# Patient Record
Sex: Female | Born: 1940 | Race: Black or African American | Hispanic: No | Marital: Married | Smoking: Never smoker
Health system: Southern US, Community
[De-identification: ages and names within clinical notes are randomized; demographics above are authoritative.]

## PROBLEM LIST (undated history)

## (undated) ENCOUNTER — Inpatient Hospital Stay: Admission: EM | Payer: Self-pay | Source: Home / Self Care

## (undated) DIAGNOSIS — R413 Other amnesia: Principal | ICD-10-CM

## (undated) DIAGNOSIS — G4730 Sleep apnea, unspecified: Secondary | ICD-10-CM

## (undated) DIAGNOSIS — Z950 Presence of cardiac pacemaker: Secondary | ICD-10-CM

## (undated) DIAGNOSIS — G971 Other reaction to spinal and lumbar puncture: Secondary | ICD-10-CM

## (undated) DIAGNOSIS — I639 Cerebral infarction, unspecified: Secondary | ICD-10-CM

## (undated) DIAGNOSIS — Z8489 Family history of other specified conditions: Secondary | ICD-10-CM

## (undated) DIAGNOSIS — F419 Anxiety disorder, unspecified: Secondary | ICD-10-CM

## (undated) DIAGNOSIS — R001 Bradycardia, unspecified: Secondary | ICD-10-CM

## (undated) DIAGNOSIS — E785 Hyperlipidemia, unspecified: Secondary | ICD-10-CM

## (undated) DIAGNOSIS — J189 Pneumonia, unspecified organism: Secondary | ICD-10-CM

## (undated) DIAGNOSIS — I10 Essential (primary) hypertension: Secondary | ICD-10-CM

## (undated) DIAGNOSIS — E039 Hypothyroidism, unspecified: Secondary | ICD-10-CM

## (undated) DIAGNOSIS — Z794 Long term (current) use of insulin: Secondary | ICD-10-CM

## (undated) DIAGNOSIS — G459 Transient cerebral ischemic attack, unspecified: Secondary | ICD-10-CM

## (undated) DIAGNOSIS — H409 Unspecified glaucoma: Secondary | ICD-10-CM

## (undated) DIAGNOSIS — D649 Anemia, unspecified: Secondary | ICD-10-CM

## (undated) DIAGNOSIS — Z9981 Dependence on supplemental oxygen: Secondary | ICD-10-CM

## (undated) DIAGNOSIS — M1990 Unspecified osteoarthritis, unspecified site: Secondary | ICD-10-CM

## (undated) DIAGNOSIS — M810 Age-related osteoporosis without current pathological fracture: Secondary | ICD-10-CM

## (undated) DIAGNOSIS — J45909 Unspecified asthma, uncomplicated: Secondary | ICD-10-CM

## (undated) DIAGNOSIS — H269 Unspecified cataract: Secondary | ICD-10-CM

## (undated) DIAGNOSIS — N200 Calculus of kidney: Secondary | ICD-10-CM

## (undated) DIAGNOSIS — F329 Major depressive disorder, single episode, unspecified: Secondary | ICD-10-CM

## (undated) DIAGNOSIS — I2510 Atherosclerotic heart disease of native coronary artery without angina pectoris: Secondary | ICD-10-CM

## (undated) DIAGNOSIS — E11319 Type 2 diabetes mellitus with unspecified diabetic retinopathy without macular edema: Secondary | ICD-10-CM

## (undated) DIAGNOSIS — M4800 Spinal stenosis, site unspecified: Secondary | ICD-10-CM

## (undated) DIAGNOSIS — E119 Type 2 diabetes mellitus without complications: Secondary | ICD-10-CM

## (undated) DIAGNOSIS — I82409 Acute embolism and thrombosis of unspecified deep veins of unspecified lower extremity: Secondary | ICD-10-CM

## (undated) DIAGNOSIS — G2581 Restless legs syndrome: Secondary | ICD-10-CM

## (undated) DIAGNOSIS — I2699 Other pulmonary embolism without acute cor pulmonale: Secondary | ICD-10-CM

## (undated) DIAGNOSIS — N189 Chronic kidney disease, unspecified: Secondary | ICD-10-CM

## (undated) DIAGNOSIS — I519 Heart disease, unspecified: Secondary | ICD-10-CM

## (undated) DIAGNOSIS — I152 Hypertension secondary to endocrine disorders: Secondary | ICD-10-CM

## (undated) DIAGNOSIS — G4734 Idiopathic sleep related nonobstructive alveolar hypoventilation: Secondary | ICD-10-CM

## (undated) DIAGNOSIS — F32A Depression, unspecified: Secondary | ICD-10-CM

## (undated) DIAGNOSIS — Z8719 Personal history of other diseases of the digestive system: Secondary | ICD-10-CM

## (undated) DIAGNOSIS — Q780 Osteogenesis imperfecta: Secondary | ICD-10-CM

## (undated) DIAGNOSIS — H3530 Unspecified macular degeneration: Secondary | ICD-10-CM

## (undated) DIAGNOSIS — T4145XA Adverse effect of unspecified anesthetic, initial encounter: Secondary | ICD-10-CM

## (undated) DIAGNOSIS — E1140 Type 2 diabetes mellitus with diabetic neuropathy, unspecified: Secondary | ICD-10-CM

## (undated) DIAGNOSIS — E1159 Type 2 diabetes mellitus with other circulatory complications: Secondary | ICD-10-CM

## (undated) DIAGNOSIS — H35039 Hypertensive retinopathy, unspecified eye: Secondary | ICD-10-CM

## (undated) DIAGNOSIS — R002 Palpitations: Secondary | ICD-10-CM

## (undated) DIAGNOSIS — T8859XA Other complications of anesthesia, initial encounter: Secondary | ICD-10-CM

## (undated) HISTORY — DX: Unspecified macular degeneration: H35.30

## (undated) HISTORY — PX: APPENDECTOMY: SHX54

## (undated) HISTORY — PX: GLAUCOMA SURGERY: SHX656

## (undated) HISTORY — DX: Transient cerebral ischemic attack, unspecified: G45.9

## (undated) HISTORY — PX: ABDOMINAL HYSTERECTOMY: SHX81

## (undated) HISTORY — PX: BACK SURGERY: SHX140

## (undated) HISTORY — PX: CHOLECYSTECTOMY: SHX55

## (undated) HISTORY — DX: Palpitations: R00.2

## (undated) HISTORY — PX: TOTAL KNEE ARTHROPLASTY: SHX125

## (undated) HISTORY — PX: EYE SURGERY: SHX253

## (undated) HISTORY — DX: Other amnesia: R41.3

## (undated) HISTORY — PX: REVISION TOTAL KNEE ARTHROPLASTY: SUR1280

## (undated) HISTORY — DX: Type 2 diabetes mellitus with diabetic neuropathy, unspecified: E11.40

## (undated) HISTORY — DX: Heart disease, unspecified: I51.9

## (undated) HISTORY — PX: JOINT REPLACEMENT: SHX530

## (undated) HISTORY — PX: TRANSTHORACIC ECHOCARDIOGRAM: SHX275

## (undated) HISTORY — PX: TRIGGER FINGER RELEASE: SHX641

## (undated) HISTORY — DX: Hypertensive retinopathy, unspecified eye: H35.039

## (undated) HISTORY — DX: Atherosclerotic heart disease of native coronary artery without angina pectoris: I25.10

## (undated) HISTORY — PX: KNEE SURGERY: SHX244

## (undated) HISTORY — PX: NM MYOCAR PERF WALL MOTION: HXRAD629

## (undated) HISTORY — PX: IRIDOTOMY / IRIDECTOMY: SHX165

## (undated) HISTORY — PX: CATARACT EXTRACTION: SUR2

## (undated) HISTORY — PX: CATARACT EXTRACTION W/ INTRAOCULAR LENS  IMPLANT, BILATERAL: SHX1307

## (undated) HISTORY — DX: Osteogenesis imperfecta: Q78.0

## (undated) HISTORY — DX: Type 2 diabetes mellitus with unspecified diabetic retinopathy without macular edema: E11.319

## (undated) HISTORY — PX: TONSILLECTOMY: SUR1361

## (undated) HISTORY — PX: PACEMAKER PLACEMENT: SHX43

---

## 1997-12-19 ENCOUNTER — Encounter: Payer: Self-pay | Admitting: Emergency Medicine

## 1997-12-19 ENCOUNTER — Inpatient Hospital Stay (HOSPITAL_COMMUNITY): Admission: EM | Admit: 1997-12-19 | Discharge: 1997-12-21 | Payer: Self-pay | Admitting: Emergency Medicine

## 1997-12-19 ENCOUNTER — Encounter: Payer: Self-pay | Admitting: Cardiovascular Disease

## 1997-12-21 ENCOUNTER — Encounter: Payer: Self-pay | Admitting: Cardiovascular Disease

## 2001-05-25 ENCOUNTER — Emergency Department (HOSPITAL_COMMUNITY): Admission: EM | Admit: 2001-05-25 | Discharge: 2001-05-25 | Payer: Self-pay | Admitting: Emergency Medicine

## 2001-07-17 ENCOUNTER — Emergency Department (HOSPITAL_COMMUNITY): Admission: EM | Admit: 2001-07-17 | Discharge: 2001-07-17 | Payer: Self-pay | Admitting: Emergency Medicine

## 2001-07-17 ENCOUNTER — Encounter: Payer: Self-pay | Admitting: Emergency Medicine

## 2002-06-19 ENCOUNTER — Emergency Department (HOSPITAL_COMMUNITY): Admission: EM | Admit: 2002-06-19 | Discharge: 2002-06-20 | Payer: Self-pay | Admitting: Emergency Medicine

## 2002-06-20 ENCOUNTER — Encounter: Payer: Self-pay | Admitting: Emergency Medicine

## 2002-12-15 ENCOUNTER — Emergency Department (HOSPITAL_COMMUNITY): Admission: EM | Admit: 2002-12-15 | Discharge: 2002-12-15 | Payer: Self-pay | Admitting: Emergency Medicine

## 2002-12-29 ENCOUNTER — Inpatient Hospital Stay (HOSPITAL_COMMUNITY): Admission: EM | Admit: 2002-12-29 | Discharge: 2003-01-01 | Payer: Self-pay | Admitting: Emergency Medicine

## 2003-01-09 ENCOUNTER — Encounter (INDEPENDENT_AMBULATORY_CARE_PROVIDER_SITE_OTHER): Payer: Self-pay

## 2003-01-09 ENCOUNTER — Ambulatory Visit (HOSPITAL_COMMUNITY): Admission: RE | Admit: 2003-01-09 | Discharge: 2003-01-09 | Payer: Self-pay | Admitting: Gastroenterology

## 2003-02-21 ENCOUNTER — Encounter: Admission: RE | Admit: 2003-02-21 | Discharge: 2003-02-21 | Payer: Self-pay | Admitting: Internal Medicine

## 2003-04-11 ENCOUNTER — Emergency Department (HOSPITAL_COMMUNITY): Admission: EM | Admit: 2003-04-11 | Discharge: 2003-04-11 | Payer: Self-pay | Admitting: Emergency Medicine

## 2004-03-31 ENCOUNTER — Encounter (INDEPENDENT_AMBULATORY_CARE_PROVIDER_SITE_OTHER): Payer: Self-pay | Admitting: *Deleted

## 2004-03-31 ENCOUNTER — Encounter: Admission: RE | Admit: 2004-03-31 | Discharge: 2004-03-31 | Payer: Self-pay | Admitting: General Surgery

## 2004-04-27 ENCOUNTER — Encounter: Admission: RE | Admit: 2004-04-27 | Discharge: 2004-04-27 | Payer: Self-pay | Admitting: Internal Medicine

## 2004-04-29 ENCOUNTER — Ambulatory Visit: Payer: Self-pay

## 2004-05-19 ENCOUNTER — Encounter (HOSPITAL_COMMUNITY): Admission: RE | Admit: 2004-05-19 | Discharge: 2004-08-17 | Payer: Self-pay | Admitting: Internal Medicine

## 2004-10-04 ENCOUNTER — Encounter (HOSPITAL_COMMUNITY): Admission: RE | Admit: 2004-10-04 | Discharge: 2005-01-02 | Payer: Self-pay | Admitting: Internal Medicine

## 2004-10-27 ENCOUNTER — Encounter: Admission: RE | Admit: 2004-10-27 | Discharge: 2004-10-27 | Payer: Self-pay | Admitting: Internal Medicine

## 2005-01-05 ENCOUNTER — Encounter: Admission: RE | Admit: 2005-01-05 | Discharge: 2005-01-05 | Payer: Self-pay | Admitting: Orthopedic Surgery

## 2005-02-08 ENCOUNTER — Emergency Department (HOSPITAL_COMMUNITY): Admission: EM | Admit: 2005-02-08 | Discharge: 2005-02-09 | Payer: Self-pay | Admitting: Emergency Medicine

## 2005-05-17 ENCOUNTER — Encounter: Admission: RE | Admit: 2005-05-17 | Discharge: 2005-05-17 | Payer: Self-pay | Admitting: Orthopedic Surgery

## 2005-08-08 ENCOUNTER — Encounter (HOSPITAL_COMMUNITY): Admission: RE | Admit: 2005-08-08 | Discharge: 2005-10-26 | Payer: Self-pay | Admitting: Internal Medicine

## 2005-08-23 ENCOUNTER — Ambulatory Visit (HOSPITAL_COMMUNITY): Admission: RE | Admit: 2005-08-23 | Discharge: 2005-08-23 | Payer: Self-pay | Admitting: Gastroenterology

## 2005-11-01 ENCOUNTER — Encounter (INDEPENDENT_AMBULATORY_CARE_PROVIDER_SITE_OTHER): Payer: Self-pay | Admitting: *Deleted

## 2005-11-01 ENCOUNTER — Ambulatory Visit (HOSPITAL_COMMUNITY): Admission: RE | Admit: 2005-11-01 | Discharge: 2005-11-01 | Payer: Self-pay | Admitting: Gastroenterology

## 2006-10-12 ENCOUNTER — Emergency Department (HOSPITAL_COMMUNITY): Admission: EM | Admit: 2006-10-12 | Discharge: 2006-10-12 | Payer: Self-pay | Admitting: Emergency Medicine

## 2007-03-16 ENCOUNTER — Ambulatory Visit: Payer: Self-pay | Admitting: *Deleted

## 2007-05-29 ENCOUNTER — Ambulatory Visit: Payer: Self-pay | Admitting: *Deleted

## 2007-07-10 ENCOUNTER — Emergency Department (HOSPITAL_COMMUNITY): Admission: EM | Admit: 2007-07-10 | Discharge: 2007-07-10 | Payer: Self-pay | Admitting: Emergency Medicine

## 2007-10-16 ENCOUNTER — Ambulatory Visit (HOSPITAL_COMMUNITY): Admission: RE | Admit: 2007-10-16 | Discharge: 2007-10-16 | Payer: Self-pay | Admitting: Internal Medicine

## 2007-12-21 ENCOUNTER — Ambulatory Visit (HOSPITAL_COMMUNITY): Admission: RE | Admit: 2007-12-21 | Discharge: 2007-12-21 | Payer: Self-pay | Admitting: Internal Medicine

## 2008-04-14 ENCOUNTER — Emergency Department (HOSPITAL_COMMUNITY): Admission: EM | Admit: 2008-04-14 | Discharge: 2008-04-14 | Payer: Self-pay | Admitting: Emergency Medicine

## 2008-05-20 DIAGNOSIS — E785 Hyperlipidemia, unspecified: Secondary | ICD-10-CM | POA: Insufficient documentation

## 2008-05-20 DIAGNOSIS — I10 Essential (primary) hypertension: Secondary | ICD-10-CM | POA: Insufficient documentation

## 2008-05-30 ENCOUNTER — Ambulatory Visit: Payer: Self-pay | Admitting: Emergency Medicine

## 2008-05-30 ENCOUNTER — Telehealth (INDEPENDENT_AMBULATORY_CARE_PROVIDER_SITE_OTHER): Payer: Self-pay | Admitting: *Deleted

## 2008-05-30 DIAGNOSIS — R0602 Shortness of breath: Secondary | ICD-10-CM | POA: Insufficient documentation

## 2008-06-05 ENCOUNTER — Ambulatory Visit: Payer: Self-pay | Admitting: Cardiology

## 2008-07-01 ENCOUNTER — Ambulatory Visit (HOSPITAL_BASED_OUTPATIENT_CLINIC_OR_DEPARTMENT_OTHER): Admission: RE | Admit: 2008-07-01 | Discharge: 2008-07-01 | Payer: Self-pay | Admitting: Emergency Medicine

## 2008-07-09 ENCOUNTER — Ambulatory Visit: Payer: Self-pay | Admitting: Emergency Medicine

## 2008-07-09 DIAGNOSIS — G4730 Sleep apnea, unspecified: Secondary | ICD-10-CM | POA: Insufficient documentation

## 2008-07-09 DIAGNOSIS — G4733 Obstructive sleep apnea (adult) (pediatric): Secondary | ICD-10-CM | POA: Insufficient documentation

## 2008-07-14 ENCOUNTER — Ambulatory Visit: Payer: Self-pay | Admitting: Pulmonary Disease

## 2008-10-02 ENCOUNTER — Encounter: Payer: Self-pay | Admitting: Emergency Medicine

## 2008-10-07 ENCOUNTER — Ambulatory Visit: Payer: Self-pay | Admitting: Emergency Medicine

## 2008-11-11 ENCOUNTER — Encounter: Admission: RE | Admit: 2008-11-11 | Discharge: 2008-11-11 | Payer: Self-pay | Admitting: Gastroenterology

## 2008-12-05 ENCOUNTER — Telehealth: Payer: Self-pay | Admitting: Emergency Medicine

## 2008-12-25 ENCOUNTER — Encounter: Payer: Self-pay | Admitting: Emergency Medicine

## 2009-03-26 ENCOUNTER — Encounter: Payer: Self-pay | Admitting: Emergency Medicine

## 2009-04-07 ENCOUNTER — Ambulatory Visit: Payer: Self-pay | Admitting: Emergency Medicine

## 2009-04-07 DIAGNOSIS — J309 Allergic rhinitis, unspecified: Secondary | ICD-10-CM | POA: Insufficient documentation

## 2009-04-07 DIAGNOSIS — J310 Chronic rhinitis: Secondary | ICD-10-CM | POA: Insufficient documentation

## 2009-05-12 ENCOUNTER — Emergency Department (HOSPITAL_COMMUNITY): Admission: EM | Admit: 2009-05-12 | Discharge: 2009-05-13 | Payer: Self-pay | Admitting: Emergency Medicine

## 2009-08-31 ENCOUNTER — Encounter: Admission: RE | Admit: 2009-08-31 | Discharge: 2009-08-31 | Payer: Self-pay | Admitting: Internal Medicine

## 2009-10-02 ENCOUNTER — Ambulatory Visit: Payer: Self-pay | Admitting: Emergency Medicine

## 2009-10-02 DIAGNOSIS — J45909 Unspecified asthma, uncomplicated: Secondary | ICD-10-CM | POA: Insufficient documentation

## 2009-10-02 DIAGNOSIS — Z86718 Personal history of other venous thrombosis and embolism: Secondary | ICD-10-CM | POA: Insufficient documentation

## 2009-11-26 ENCOUNTER — Ambulatory Visit (HOSPITAL_COMMUNITY): Admission: RE | Admit: 2009-11-26 | Discharge: 2009-11-26 | Payer: Self-pay | Admitting: Orthopedic Surgery

## 2010-01-14 ENCOUNTER — Ambulatory Visit (HOSPITAL_COMMUNITY)
Admission: RE | Admit: 2010-01-14 | Discharge: 2010-01-14 | Payer: Self-pay | Source: Home / Self Care | Attending: Gastroenterology | Admitting: Gastroenterology

## 2010-01-21 ENCOUNTER — Ambulatory Visit: Payer: Self-pay | Admitting: Emergency Medicine

## 2010-02-16 ENCOUNTER — Encounter
Admission: RE | Admit: 2010-02-16 | Discharge: 2010-02-16 | Payer: Self-pay | Source: Home / Self Care | Attending: Diagnostic Neuroimaging | Admitting: Diagnostic Neuroimaging

## 2010-02-28 ENCOUNTER — Encounter: Payer: Self-pay | Admitting: Gastroenterology

## 2010-03-09 NOTE — Assessment & Plan Note (Signed)
Summary: dyspnea   Visit Type:  Follow-up Copy to:  Dr. Shon Baton Primary Provider/Referring Provider:  Dr. Shon Baton  CC:  rov SOB worse with exertion.. sleep studies results should be back for consultation.  History of Present Illness: 70 yo woman with mild asthma, hx multiple PE's no longer on warfarin due to inability to tolerate. She has noticed progressive DOE beginning about 6 mo ago. Eval has included low prob V/Q scan, full PFT' s that show mixed disease, overnight oximetry was normal, cardiac evaulation (09/12/07) that was reassuring Melvern Banker), no evidence for pulm HTN by TTE. Possible mild interstitial prominence on CXR 04/14/08. 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 Snores, no witnessed apneas, no jerking herself awake, naps some days.   ROV 07/09/08 -- PFT's with mixed disease, CT scan as below (no significant ILD), PSG shows mild OSA based on RDI (AHI was only 3.3/hour).  Returns today for f/u. Has used SABA sparingly, doesn't seem to help her. Still feels SOB with exertion. She is using O2 as needed - feels that this helps even though she has not has exertional desats in the past.   Allergies (verified): 1)  ! Prevacid 2)  ! Iodine 3)  ! * Shellfish 4)  ! Reglan  Vital Signs:  Patient profile:   70 year old female Weight:      293.25 pounds BMI:     46.10 O2 Sat:      96 % on Room air Temp:     97.5 degrees F oral Pulse rate:   84 / minute BP sitting:   124 / 78  (left arm) Cuff size:   regular  Vitals Entered By: Marcy Siren Deborra Medina) (July 09, 2008 9:10 AM)  O2 Flow:  Room air  Serial Vital Signs/Assessments:  Comments: 10:08 AM Ambulatory Pulse Oximetry  Resting; HR____85_    02 Sat__98___  Lap1 (185 feet)   HR__100___   02 Sat__98___ Lap2 (185 feet)   HR__112___   02 Sat__99___    Lap3 (185 feet)   HR__115___   02 Sat__99___  ___Test Completed without  Difficulty _x__Test Stopped due to:  Pt had to stop and rest after the 2nd lap. She complained of severe sob and her breathing was labored.  By: Francesca Jewett CMA    Physical Exam  General:  normal appearance, healthy appearing, and obese.   Head:  normocephalic and atraumatic Eyes:  conjunctiva and sclera clear Nose:  no deformity, discharge, inflammation, or lesions Mouth:  no deformity or lesions, mild erythema Neck:  no masses, thyromegaly, or abnormal cervical nodes Lungs:  clear bilaterally to auscultation, no wheeze Heart:  regular rate and rhythm, S1, S2 without murmurs, rubs, gallops, or clicks Abdomen:  not examined Extremities:  no clubbing, cyanosis, edema, or deformity noted Neurologic:  non-focal Psych:  alert and cooperative; normal mood and affect; normal attention span and concentration   CT of Chest  Procedure date:  06/05/2008  Findings:      Findings: There is no pleural or pericardial fluid.  No apparent mediastinal or hilar adenopathy.  The upper lobes are clear.  There is a pattern of mild fibrosis at the lung bases, more notable on the right than the left.  No demonstrable air trapping.  No airway pathology is evident.  Scans in the upper abdomen do not show any significant finding.  There is ordinary degenerative  change of the thoracic spine.   IMPRESSION: No active process evident.  Mild fibrosis at the lung bases, right more than left.  No specific finding or evidence of other active process.  Impression & Recommendations:  Problem # 1:  DYSPNEA (ICD-786.05) No desat on walk today. No ILD. Any component of AFL is mild. i believe that this i slargely deconditioning, have encouraged her to increase activity as tolerated. She does not need O2 while walking.  Orders: Est. Patient Level IV [id number]) DME Referral (DME)  Problem # 2:  SLEEP APNEA (ICD-780.57) Home CPAP titration thru [facility], then follow up to review.   Medications Added to  Medication List This Visit: 1)  Atenolol 25 Mg Tabs (Atenolol) .... Once daily (on hold) 2)  Novolin R 100 Unit/ml Soln (Insulin regular human) .... As directed  Patient Instructions: 1)  Walking Oximetry today. 2)  Continue to have your rescue inhaler available at all times.  3)  We will arrange for an auto-titration CPAP study at your home through [facility].  4)  Follow up with Dr Lamonte Sakai in 2 months or as needed

## 2010-03-09 NOTE — Progress Notes (Signed)
Summary: ?[facility]/cpap  Phone Note Call from Patient Call back at Home Phone [phone]   Caller: Patient Reason for Call: Talk to Nurse Summary of Call: Patient calling saying that [facility] never came to her house to adjust CPAP pressure. patient's cell #[phone] Initial call taken by: Mateo Flow,  December 05, 2008 12:10 PM  Follow-up for Phone Call        called, spoke with pt.  Pt states it was discussed t last ov on 10/07/08 to set her cpap at a set pressure of 15.  Pt states Amy at [facility] hasn't received any orders to change cpap settings.  no orders for this in EMR.  will forward to RB-please advise if an order needs to be put in for [place].  Pt aware RB is out of office until this evening and is ok to wait.  Raymondo Band RN  December 05, 2008 12:20 PM    Additional Follow-up for Phone Call Additional follow up Details #1::        [facility] download looks like 12cm H2O would be adequate. Will order this now.  Additional Follow-up by: Collene Gobble MD,  December 05, 2008 4:37 PM    Additional Follow-up for Phone Call Additional follow up Details #2::    ATC no answer no voicemail. Santa Nella Bing CMA  December 05, 2008 5:03 PM

## 2010-03-09 NOTE — Assessment & Plan Note (Signed)
Summary: OSA, rhinitis   Visit Type:  Follow-up Copy to:  Dr. Shon Baton Primary Provider/Referring Provider:  Dr. Shon Baton  CC:  6 month follouwp OSA and Dyspnea.  Pt states that she was unable to use CPAP due to "constant cough" and runny nose.  She stopped using mahine back in Jan 11'.  She states that her breathing has been worsening over the past 3 months.  She gets SOB with little exertion such as walking from room to room at her home.  Pt also c/o cough x several months- prod with clear to white sputum.Marland Kitchen  History of Present Illness: 70 yo woman with mild asthma, hx multiple PE's no longer on warfarin due to inability to tolerate. She has noticed progressive DOE beginning about 6 mo ago. Eval has included low prob V/Q scan, full PFT' s that show mixed disease, overnight oximetry was normal, cardiac evaulation (09/12/07) that was reassuring Melvern Banker), no evidence for pulm HTN by TTE. Possible mild interstitial prominence on CXR 04/14/08. 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 Snores, no witnessed apneas, no jerking herself awake, naps some days.   ROV 72/2/10 -- PFT's with mixed disease, CT scan as below (no significant ILD), PSG shows mild OSA based on RDI (AHI was only 3.3/hour).  Returns today for f/u. Has used SABA sparingly, doesn't seem to help her. Still feels SOB with exertion. She is using O2 as needed - feels that this helps even though she has not has exertional desats in the past.   ROV 10/07/08 -- Returns today after starting CPAP auto-titration device. Feels less fatigue, is sleeping better. Haven't received the download data from [place] yet. Still with exertional SOB. Still uses O2 as a security blanket. Has been having a lot of PND, a lot of URI symptoms. About to start zytrec per Dr Virgina Jock.   ROV 04/07/09 --Hx prior PE's. Had done well with CPAP when we initially started it for about  a month. But in 12/10 had so much nasal gtt and coughing that she had to stop the CPAP. [facility] came to pick up the machine (wasn't wearing 4h/night). Had not typically had trouble with allergies prior to this. Has been treated with benzonatate, phenergan + codeine, abx. No clear sick contacts. Used her ventolin some when she initially got sick, thought she had a cold.   Current Medications (verified): 1)  Atenolol 25 Mg Tabs (Atenolol) .... Once Daily 2)  Glucotrol Xl 10 Mg Xr24h-Tab (Glipizide) .... 2 Tabs Two Times A Day 3)  Plavix 75 Mg Tabs (Clopidogrel Bisulfate) .... Once Daily 4)  Nexium 40 Mg Cpdr (Esomeprazole Magnesium) .... Once Daily 5)  Diovan Hct 160-25 Mg Tabs (Valsartan-Hydrochlorothiazide) .... Once Daily 6)  Clonazepam 0.5 Mg Tabs (Clonazepam) .... Once Daily 7)  Nortriptyline Hcl 25 Mg Caps (Nortriptyline Hcl) .... Once Daily 8)  Lasix 20 Mg Tabs (Furosemide) .... Once Daily 9)  Terazol 7 0.4 % Crea (Terconazole) .... As Directed 10)  Rolaids 550-110 Mg Chew (Ca Carbonate-Mag Hydroxide) .... As Needed 11)  Novolin N 100 Unit/ml Susp (Insulin Isophane Human) .... As Directed 12)  Crestor 10 Mg Tabs (Rosuvastatin Calcium) .Marland Kitchen.. 1 Once Daily 13)  Klor-Con 20 Meq Pack (Potassium Chloride) .... Once Daily 14)  Ventolin Hfa 108 (90 Base) Mcg/act Aers (Albuterol Sulfate) .... As Needed 15)  Levoxyl 100 Mcg Tabs (Levothyroxine Sodium) .... Once Daily 16)  Novolin R 100 Unit/ml Soln (Insulin Regular Human) .... As Directed 17)  Vesicare 5 Mg Tabs (Solifenacin Succinate) .Marland Kitchen.. 1 By Mouth Daily 18)  Lidoderm 5 % Ptch (Lidocaine) .... As Directed 19)  Promethazine-Codeine 6.25-10 Mg/86ml Syrp (Promethazine-Codeine) .Marland Kitchen.. 1 Tsp Every 4 Hours As Needed For Cough  Allergies (verified): 1)  ! Prevacid 2)  ! Iodine 3)  ! * Shellfish 4)  ! Reglan  Vital Signs:  Patient profile:   70 year old female Weight:      283.25 pounds O2 Sat:      99 % on Room air Temp:     98.1 degrees F  oral Pulse rate:   98 / minute BP sitting:   140 / 78  (left arm) Cuff size:   large  Vitals Entered By: Tilden Dome (April 07, 2009 2:11 PM)  O2 Flow:  Room air   Impression & Recommendations:  Problem # 1:  ALLERGIC RHINITIS (ICD-477.9)  start NSWs fluticisone two times a day rov 2 months or as needed   Her updated medication list for this problem includes:    Fluticasone Propionate 50 Mcg/act Susp (Fluticasone propionate) .Marland Kitchen... 2 sprays each nostril two times a day  Orders: Est. Patient Level III [id number])  Problem # 2:  SLEEP APNEA (ICD-780.57)  hold off on CPAP for now until we get rhinitis under control. Her OSA is mild by PSG, but she has been symptomatic.   Orders: Est. Patient Level III [id number])  Medications Added to Medication List This Visit: 1)  Crestor 10 Mg Tabs (Rosuvastatin calcium) .Marland Kitchen.. 1 once daily 2)  Promethazine-codeine 6.25-10 Mg/36ml Syrp (Promethazine-codeine) .Marland Kitchen.. 1 tsp every 4 hours as needed for cough 3)  Fluticasone Propionate 50 Mcg/act Susp (Fluticasone propionate) .... 2 sprays each nostril two times a day  Patient Instructions: 1)  Start using nasal saline washes every day 2)  Start using fluticisone nasal spray, 2 sprays to each nostril two times a day  3)  We won't restart CPAP for now, but will revisit this next time 4)  Follow up with Dr Lamonte Sakai in 2 months or as needed  Prescriptions: FLUTICASONE PROPIONATE 50 MCG/ACT SUSP (FLUTICASONE PROPIONATE) 2 sprays each nostril two times a day  #1 x 5   Entered and Authorized by:   Collene Gobble MD   Signed by:   Collene Gobble MD on 04/07/2009   Method used:   Electronically to        [facility]  [phone]* (retail)       [place], [place]  [address]       Ph: [phone] or [phone]       Fax: [phone]   [id number]

## 2010-03-09 NOTE — Assessment & Plan Note (Signed)
Summary: allergies, mild OSA, mild asthma   Visit Type:  Follow-up Copy to:  Dr. Shon Baton Primary Provider/Referring Provider:  Dr. Shon Baton  CC:  OSA.  AR.   Patient is no longer using the cpap due to nasal congestion. She says her SOB has increased when exerting and she has frequent headaches. Patient needs refill of Flonase.Marland Kitchen  History of Present Illness: 70 yo woman with mild asthma, hx multiple PE's no longer on warfarin due to inability to tolerate. She has noticed progressive DOE beginning about 6 mo ago. Eval has included low prob V/Q scan, full PFT' s that show mixed disease, overnight oximetry was normal, cardiac evaulation (09/12/07) that was reassuring Melvern Banker), no evidence for pulm HTN by TTE. Possible mild interstitial prominence on CXR 04/14/08. 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 Snores, no witnessed apneas, no jerking herself awake, naps some days. PSG with mild OSA, RDI 3.3/h.   ROV 10/01/09 -- follow up OSA, allergies, mixed disease on PFT. Still w some clear nasal drainage, unable to smell anything. Taste is intact. She doesn't have CPAP anymore (couldn't wear it 4+ hours). Needs refill nasal steroid. Not using nasal washes or anti-histamine right now. C/o exertional SOB, litlle change from prior. Uses ventolin, usually at night, wheeze wakes her from sleep. Uses about 3x a week. Frequent falls, sounds like syncope, being evaluated by Dr Virgina Jock.   Preventive Screening-Counseling & Management  Alcohol-Tobacco     Alcohol drinks/day: 0     Smoking Status: never  Current Medications (verified): 1)  Atenolol 25 Mg Tabs (Atenolol) .... Once Daily 2)  Glucotrol Xl 10 Mg Xr24h-Tab (Glipizide) .... 2 Tabs Two Times A Day 3)  Plavix 75 Mg Tabs (Clopidogrel Bisulfate) .... Once Daily 4)  Nexium 40 Mg Cpdr (Esomeprazole Magnesium) .... Once Daily 5)  Diovan Hct 160-25 Mg Tabs  (Valsartan-Hydrochlorothiazide) .... Once Daily 6)  Clonazepam 0.5 Mg Tabs (Clonazepam) .... Once Daily 7)  Nortriptyline Hcl 25 Mg Caps (Nortriptyline Hcl) .... Once Daily 8)  Lasix 20 Mg Tabs (Furosemide) .... Once Daily 9)  Terazol 7 0.4 % Crea (Terconazole) .... As Directed 10)  Rolaids 550-110 Mg Chew (Ca Carbonate-Mag Hydroxide) .... As Needed 11)  Novolin N 100 Unit/ml Susp (Insulin Isophane Human) .... As Directed 12)  Crestor 10 Mg Tabs (Rosuvastatin Calcium) .Marland Kitchen.. 1 Once Daily 13)  Klor-Con 20 Meq Pack (Potassium Chloride) .... Once Daily 14)  Ventolin Hfa 108 (90 Base) Mcg/act Aers (Albuterol Sulfate) .... As Needed 15)  Levoxyl 100 Mcg Tabs (Levothyroxine Sodium) .... Once Daily 16)  Novolin R 100 Unit/ml Soln (Insulin Regular Human) .... As Directed 17)  Vesicare 5 Mg Tabs (Solifenacin Succinate) .Marland Kitchen.. 1 By Mouth Daily 18)  Lidoderm 5 % Ptch (Lidocaine) .... As Directed 19)  Promethazine-Codeine 6.25-10 Mg/77ml Syrp (Promethazine-Codeine) .Marland Kitchen.. 1 Tsp Every 4 Hours As Needed For Cough 20)  Fluticasone Propionate 50 Mcg/act Susp (Fluticasone Propionate) .... 2 Sprays Each Nostril Two Times A Day  Allergies (verified): 1)  ! Prevacid 2)  ! Iodine 3)  ! * Shellfish 4)  ! Reglan  Vital Signs:  Patient profile:   70 year old female Height:      67.5 inches (171.45 cm) Weight:      289 pounds (131.36 kg) BMI:     44.76 O2 Sat:      97 % on Room air Temp:  97.8 degrees F (36.56 degrees C) oral Pulse rate:   94 / minute BP sitting:   130 / 80  (left arm) Cuff size:   large  Vitals Entered By: Francesca Jewett CMA (October 02, 2009 8:49 AM)  O2 Sat at Rest %:  97 O2 Flow:  Room air CC: OSA.  AR.   Patient is no longer using the cpap due to nasal congestion. She says her SOB has increased when exerting and she has frequent headaches. Patient needs refill of Flonase. Comments Medications reviewed with the patient. Daytime phone verified. Francesca Jewett CMA  October 02, 2009 8:58  AM   Physical Exam  General:  normal appearance, healthy appearing, and obese.   Head:  normocephalic and atraumatic Eyes:  conjunctiva and sclera clear Nose:  no deformity, discharge, inflammation, or lesions Mouth:  no deformity or lesions, mild erythema Neck:  no masses, thyromegaly, or abnormal cervical nodes Lungs:  clear bilaterally to auscultation, no wheeze Heart:  regular rate and rhythm, S1, S2 without murmurs, rubs, gallops, or clicks Abdomen:  not examined Extremities:  no clubbing, cyanosis, edema, or deformity noted Neurologic:  non-focal Psych:  alert and cooperative; normal mood and affect; normal attention span and concentration   Impression & Recommendations:  Problem # 1:  ALLERGIC RHINITIS (ICD-477.9)  - [place] - nasal steroid - loratadine  Orders: Est. Patient Level IV [id number])  Problem # 2:  SLEEP APNEA (ICD-780.57)  Mild. Will defer restart CPAP at this time, reconsider if we get nasal symptoms under control.   Orders: Est. Patient Level IV [id number])  Problem # 3:  PULMONARY EMBOLISM, HX OF (ICD-V12.51)  Orders: Est. Patient Level IV [id number])  Problem # 4:  INTRINSIC ASTHMA, UNSPECIFIED (ICD-493.10) - as needed ventolin  Patient Instructions: 1)  Use Nasonex 2 sprays each side once daily until your sample runs out, then go back to fluticasone 2 sprays each nostril two times a day through your pharmacy.  2)  Restart nasal saline washes every day. We will give you a recipe to make the solution.  3)  Start loratadine 10mg  by mouth once daily. 4)  Follow up with Dr Lamonte Sakai in 3 months.

## 2010-03-09 NOTE — Assessment & Plan Note (Signed)
Summary: OSA, dyspnea   Visit Type:  Follow-up Copy to:  Dr. Shon Baton Primary Provider/Referring Provider:  Dr. Shon Baton  CC:  Sleep apnea/dyspnea follow-up.   Pt states her breathing seems to be some better since using the CPAP at night.   She feels more rested and alert.Marland Kitchen  History of Present Illness: 70 yo woman with mild asthma, hx multiple PE's no longer on warfarin due to inability to tolerate. She has noticed progressive DOE beginning about 6 mo ago. Eval has included low prob V/Q scan, full PFT' s that show mixed disease, overnight oximetry was normal, cardiac evaulation (09/12/07) that was reassuring Melvern Banker), no evidence for pulm HTN by TTE. Possible mild interstitial prominence on CXR 04/14/08. 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 Snores, no witnessed apneas, no jerking herself awake, naps some days.   ROV 07/09/08 -- PFT's with mixed disease, CT scan as below (no significant ILD), PSG shows mild OSA based on RDI (AHI was only 3.3/hour).  Returns today for f/u. Has used SABA sparingly, doesn't seem to help her. Still feels SOB with exertion. She is using O2 as needed - feels that this helps even though she has not has exertional desats in the past.   ROV 10/07/08 -- Returns today after starting CPAP auto-titration device. Feels less fatigue, is sleeping better. Haven't received the download data from [place] yet. Still with exertional SOB. Still uses O2 as a security blanket. Has been having a lot of PND, a lot of URI symptoms. About to start zytrec per Dr Virgina Jock.   Current Medications (verified): 1)  Atenolol 25 Mg Tabs (Atenolol) .... Once Daily 2)  Glucotrol Xl 10 Mg Xr24h-Tab (Glipizide) .... 2 Tabs Two Times A Day 3)  Plavix 75 Mg Tabs (Clopidogrel Bisulfate) .... Once Daily 4)  Nexium 40 Mg Cpdr (Esomeprazole Magnesium) .... Once Daily 5)  Diovan Hct 160-25 Mg Tabs  (Valsartan-Hydrochlorothiazide) .... Once Daily 6)  Clonazepam 0.5 Mg Tabs (Clonazepam) .... Once Daily 7)  Nortriptyline Hcl 25 Mg Caps (Nortriptyline Hcl) .... Once Daily 8)  Atenolol 25 Mg Tabs (Atenolol) .... Once Daily (On Hold) 9)  Lasix 20 Mg Tabs (Furosemide) .... Once Daily 10)  Terazol 7 0.4 % Crea (Terconazole) .... As Directed 11)  Rolaids 550-110 Mg Chew (Ca Carbonate-Mag Hydroxide) .... As Needed 12)  Novolin N 100 Unit/ml Susp (Insulin Isophane Human) .... As Directed 13)  Tricor 145 Mg Tabs (Fenofibrate) .... Once Daily 14)  Klor-Con 20 Meq Pack (Potassium Chloride) .... Once Daily 15)  Ventolin Hfa 108 (90 Base) Mcg/act Aers (Albuterol Sulfate) .... As Needed 16)  Levoxyl 100 Mcg Tabs (Levothyroxine Sodium) .... Once Daily 17)  Novolin R 100 Unit/ml Soln (Insulin Regular Human) .... As Directed 18)  Vesicare 5 Mg Tabs (Solifenacin Succinate) .Marland Kitchen.. 1 By Mouth Daily 19)  Lidoderm 5 % Ptch (Lidocaine) .... As Directed  Allergies (verified): 1)  ! Prevacid 2)  ! Iodine 3)  ! * Shellfish 4)  ! Reglan  Vital Signs:  Patient profile:   70 year old female Height:      67.5 inches (171.45 cm) Weight:      297 pounds (135 kg) BMI:     46.00 O2 Sat:      98 % on Room air Temp:     97.9 degrees F (36.61 degrees C) oral Pulse rate:   85 / minute BP  sitting:   128 / 64  (left arm) Cuff size:   large  Vitals Entered By: Francesca Jewett CMA (October 07, 2008 4:09 PM)  O2 Flow:  Room air  Physical Exam  General:  normal appearance, healthy appearing, and obese.   Head:  normocephalic and atraumatic Eyes:  conjunctiva and sclera clear Nose:  no deformity, discharge, inflammation, or lesions Mouth:  no deformity or lesions, mild erythema Neck:  no masses, thyromegaly, or abnormal cervical nodes Lungs:  clear bilaterally to auscultation, no wheeze Heart:  regular rate and rhythm, S1, S2 without murmurs, rubs, gallops, or clicks Abdomen:  not examined Extremities:  no clubbing,  cyanosis, edema, or deformity noted Neurologic:  non-focal Psych:  alert and cooperative; normal mood and affect; normal attention span and concentration   Impression & Recommendations:  Problem # 1:  SLEEP APNEA (ICD-780.57)  will adjust CPAP based on titration from [place] f/u in 6 mo  Orders: Est. Patient Level III [id number])  Problem # 2:  DYSPNEA (ICD-786.05)  Largely due to deconditioning, eval has been reassuring. treatment of OSA has seemed to help, should help with her ability to exert during the day  Orders: Est. Patient Level III [id number])  Medications Added to Medication List This Visit: 1)  Vesicare 5 Mg Tabs (Solifenacin succinate) .Marland Kitchen.. 1 by mouth daily 2)  Lidoderm 5 % Ptch (Lidocaine) .... As directed  Patient Instructions: 1)  We will adjust your CPAP based on the results from [place].  2)  Keep your rescue inhaler available 3)  Agree with starting zyrtec as planned by Dr Virgina Jock 4)  Follow up with Dr Lamonte Sakai in 6 months or as needed.

## 2010-03-09 NOTE — Letter (Signed)
Summary: [facility]  [facility]   Imported By: Bubba Hales 12/26/2008 11:46:12  _____________________________________________________________________  External Attachment:    Type:   Image     Comment:   External Document

## 2010-03-09 NOTE — Progress Notes (Signed)
Summary: setting up CT/ ins info  Phone Note Call from Patient Call back at Home Phone [phone]   Caller: Patient Call For: libby Summary of Call: pt wanted to inform libby that pt DOES have secondary ins w/ [employer]. says libby is setting up CT for pt Initial call taken by: Cooper Render,  May 30, 2008 5:21 PM  Follow-up for Phone Call        spoke to pt had both ins cards in her chart she is aware  Follow-up by: Ova Freshwater,  June 02, 2008 10:35 AM

## 2010-03-09 NOTE — Assessment & Plan Note (Signed)
Summary: pulm HTN   Visit Type:  Initial Consult Copy to:  Dr. Shon Baton Primary Provider/Referring Provider:  Dr. Shon Baton  CC:  pulmonary consult....SOB with exertion......also when sleeping....coughing...Marland KitchenMarland Kitchenasthmatic.......  History of Present Illness: 70 yo woman with mild asthma, hx multiple PE's no longer on warfarin due to inability to tolerate. She has noticed progressive DOE beginning about 6 mo ago. Eval has included low prob V/Q scan, full PFT' s that show mixed disease, overnight oximetry was normal, cardiac evaulation (09/12/07) that was reassuring Melvern Banker), no evidence for pulm HTN by TTE. Possible mild interstitial prominence on CXR 04/14/08. 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   Snores, no witnessed apneas, no jerking herself awake, naps some days.   Currently has a cold, on erythromycin  Preventive Screening-Counseling & Management     Alcohol drinks/day: 0     Smoking Status: never  Current Medications (verified): 1)  Atenolol 25 Mg Tabs (Atenolol) .... Once Daily 2)  Novolin R Penfill 100 Unit/ml Soln (Insulin Regular Human) .... As Directed 3)  Glucotrol Xl 10 Mg Xr24h-Tab (Glipizide) .... 2 Tabs Two Times A Day 4)  Plavix 75 Mg Tabs (Clopidogrel Bisulfate) .... Once Daily 5)  Nexium 40 Mg Cpdr (Esomeprazole Magnesium) .... Once Daily 6)  Diovan Hct 160-25 Mg Tabs (Valsartan-Hydrochlorothiazide) .... Once Daily 7)  Clonazepam 0.5 Mg Tabs (Clonazepam) .... Once Daily 8)  Nortriptyline Hcl 25 Mg Caps (Nortriptyline Hcl) .... Once Daily 9)  Atenolol 25 Mg Tabs (Atenolol) .... Once Daily 10)  Lasix 20 Mg Tabs (Furosemide) .... Once Daily 11)  Terazol 7 0.4 % Crea (Terconazole) .... As Directed 12)  Ditropan Xl 10 Mg Xr24h-Tab (Oxybutynin Chloride) .... As Needed 13)  Rolaids 550-110 Mg Chew (Ca Carbonate-Mag Hydroxide) .... As Needed 14)  Novolin N 100 Unit/ml Susp  (Insulin Isophane Human) .... As Directed 15)  Tricor 145 Mg Tabs (Fenofibrate) .... Once Daily 16)  Klor-Con 20 Meq Pack (Potassium Chloride) .... Once Daily 17)  Ventolin Hfa 108 (90 Base) Mcg/act Aers (Albuterol Sulfate) .... As Needed 18)  Levoxyl 100 Mcg Tabs (Levothyroxine Sodium) .... Once Daily  Allergies (verified): 1)  ! Prevacid 2)  ! Iodine  Past History:  Past Medical History:    Diabetes    Hyperlipidemia    Hypertension    asthma, dx '92 in [place]    PE x 3, on plavix because she couldn't tolerate warfarin  Past Surgical History:    hand    cataracts    hystrerectomy    leg surgery    knee replacement  Family History:    emphysema-grandfather    heart disease- mother    allergies- to anithesia    cancer- mother breast    father-pancreatic  Social History:    lives with husband jim    children    1 dog    Alcohol drinks/day:  0    Smoking Status:  never  Review of Systems       The patient complains of shortness of breath with activity and productive cough.  The patient denies shortness of breath at rest, non-productive cough, and coughing up blood.         prod cough in the am - white  Vital Signs:  Patient profile:   70 year old female Height:      67 inches Weight:      292 pounds  BMI:     45.90 O2 Sat:      97 % Temp:     97.4 degrees F oral Pulse rate:   99 / minute BP sitting:   110 / 70  (left arm) Cuff size:   regular  Vitals Entered By: Marcy Siren Deborra Medina) (May 30, 2008 4:02 PM)  Physical Exam  General:  normal appearance, healthy appearing, and obese.   Head:  normocephalic and atraumatic Eyes:  conjunctiva and sclera clear Nose:  no deformity, discharge, inflammation, or lesions Mouth:  no deformity or lesions, mild erythema Neck:  no masses, thyromegaly, or abnormal cervical nodes Lungs:  clear bilaterally to auscultation, no wheeze Heart:  regular rate and rhythm, S1, S2 without murmurs, rubs, gallops, or  clicks Abdomen:  obese, non-tender Extremities:  no clubbing, cyanosis, edema, or deformity noted Neurologic:  non-focal Psych:  alert and cooperative; normal mood and affect; normal attention span and concentration    Echocardiogram  Procedure date:  09/12/2007  Findings:      Mild-to-mod LV hypertrophy Normal LV fxn, EF [id number] diastolic dysfxn normal RV size and fxn  Pulmonary Function Test Date: 12/21/2007 Height (in.): 67 Gender: Female  Pre-Spirometry FVC    Value: 1.91 L/min   Pred: 3.20 L/min     % Pred: 60 % FEV1    Value: 1.61 L     Pred: 2.31 L     % Pred: 70 % FEV1/FVC  Value: 84 %     Pred: 78 %     % Pred: - % FEF 25-75  Value: 2.17 L/min   Pred: 2.50 L/min     % Pred: 87 %  Post-Spirometry FVC    Value: 1.95 L/min   Pred: 3.20 L/min     % Pred: 61 % FEV1    Value: 1.62 L     Pred: 2.31 L     % Pred: 70 % FEV1/FVC  Value: 83 %     Pred: 107 %     % Pred: - % FEF 25-75  Value: 2.48 L/min   Pred: 2.50 L/min     % Pred: 99 %  Comments: Probable mixed disease, no change after BD. RSB  Other Orders: Consultation Level IV [id number]) Sleep Disorder Referral (Sleep Disorder) Radiology Referral (Radiology)  Patient Instructions: 1)  High resolution CT scan of the chest. 2)  Sleep Study. 3)  Continue to have your Ventolin available to use as needed.  4)  Follow up with Dr Lamonte Sakai after your testing.

## 2010-03-09 NOTE — Letter (Signed)
Summary: [facility]  [facility]   Imported By: Bubba Hales 12/26/2008 11:33:16  _____________________________________________________________________  External Attachment:    Type:   Image     Comment:   External Document

## 2010-03-09 NOTE — Letter (Signed)
Summary: CMN/[facility]  CMN/[facility]   Imported By: Bubba Hales 03/31/2009 09:54:59  _____________________________________________________________________  External Attachment:    Type:   Image     Comment:   External Document

## 2010-03-11 NOTE — Assessment & Plan Note (Signed)
Summary: OSA, allergic rhinitis   Visit Type:  Follow-up Copy to:  Dr. Shon Baton Primary Provider/Referring Provider:  Dr. Shon Baton  CC:  Pt c/o incr SOB w/ exertion, PND, and cough w/ white sputum.  History of Present Illness: 70 yo woman with mild asthma, hx multiple PE's no longer on warfarin due to inability to tolerate. She has noticed progressive DOE beginning about 6 mo ago. Eval has included low prob V/Q scan, full PFT' s that show mixed disease, overnight oximetry was normal, cardiac evaulation (09/12/07) that was reassuring Melvern Banker), no evidence for pulm HTN by TTE. Possible mild interstitial prominence on CXR 04/14/08. 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 Snores, no witnessed apneas, no jerking herself awake, naps some days. PSG with mild OSA, RDI 3.3/h.   ROV 10/01/09 -- follow up OSA, allergies, mixed disease on PFT. Still w some clear nasal drainage, unable to smell anything. Taste is intact. She doesn't have CPAP anymore (couldn't wear it 4+ hours). Needs refill nasal steroid. Not using nasal washes or anti-histamine right now. C/o exertional SOB, litlle change from prior. Uses ventolin, usually at night, wheeze wakes her from sleep. Uses about 3x a week. Frequent falls, sounds like syncope, being evaluated by Dr Virgina Jock.   ROV 01/21/10 -- follows up for her OSA, allergies, mixed disease on PFT. She recently underwent resection gastric polyp (benign), also dealing w back pain. Feeling fatigued. Continued nasal gtt, doing nasal saline washes once daily, has taken loratadine and nasal steroid but ran out recently. Still with DOE. Gained about 12 lbs since last time. Syncope being evaluated by Dr Virgina Jock and neurology.   Current Medications (verified): 1)  Atenolol 25 Mg Tabs (Atenolol) .... Once Daily 2)  Glucotrol Xl 10 Mg Xr24h-Tab (Glipizide) .... 2 Tabs Two Times A Day 3)  Plavix 75  Mg Tabs (Clopidogrel Bisulfate) .... On Hold 4)  Nexium 40 Mg Cpdr (Esomeprazole Magnesium) .... Once Daily 5)  Losartan Potassium 50 Mg Tabs (Losartan Potassium) .Marland Kitchen.. 1 By Mouth Daily 6)  Clonazepam 0.5 Mg Tabs (Clonazepam) .... 2 By Mouth Every Morning and 1 By Mouth At Bedtime 7)  Nortriptyline Hcl 25 Mg Caps (Nortriptyline Hcl) .... Once Daily 8)  Lasix 20 Mg Tabs (Furosemide) .... Once Daily 9)  Terazol 7 0.4 % Crea (Terconazole) .... As Directed 10)  Rolaids 550-110 Mg Chew (Ca Carbonate-Mag Hydroxide) .... As Needed 11)  Novolin N 100 Unit/ml Susp (Insulin Isophane Human) .... As Directed 12)  Crestor 10 Mg Tabs (Rosuvastatin Calcium) .Marland Kitchen.. 1 Once Daily 13)  Klor-Con 20 Meq Pack (Potassium Chloride) .... Once Daily 14)  Ventolin Hfa 108 (90 Base) Mcg/act Aers (Albuterol Sulfate) .... As Needed 15)  Levoxyl 100 Mcg Tabs (Levothyroxine Sodium) .... Once Daily 16)  Novolin R 100 Unit/ml Soln (Insulin Regular Human) .... As Directed 17)  Vesicare 5 Mg Tabs (Solifenacin Succinate) .Marland Kitchen.. 1 By Mouth Daily 18)  Lidoderm 5 % Ptch (Lidocaine) .... As Directed 19)  Nasonex 50 Mcg/act Susp (Mometasone Furoate) .... 2 By Mouth Two Times A Day 20)  Hydrochlorothiazide 25 Mg Tabs (Hydrochlorothiazide) .Marland Kitchen.. 1 By Mouth Daily  Allergies (verified): 1)  ! Prevacid 2)  ! Iodine 3)  ! * Shellfish 4)  ! Reglan  Vital Signs:  Patient profile:   70 year old female Height:      67.5 inches (171.45 cm) Weight:  294.38 pounds (133.81 kg) BMI:     45.59 O2 Sat:      96 % on Room air Temp:     97.7 degrees F (36.50 degrees C) oral Pulse rate:   108 / minute BP sitting:   142 / 80  (left arm) Cuff size:   large  Vitals Entered By: Francesca Jewett CMA (January 21, 2010 2:45 PM)  O2 Sat at Rest %:  96 O2 Flow:  Room air CC: Pt c/o incr SOB w/ exertion, PND, cough w/ white sputum Comments Medications reviewed with patient Francesca Jewett [facility]  January 21, 2010 2:57 PM   Physical Exam  General:   normal appearance, healthy appearing, and obese.   Head:  normocephalic and atraumatic Eyes:  conjunctiva and sclera clear Nose:  no deformity, discharge, inflammation, or lesions Mouth:  no deformity or lesions, mild erythema Neck:  no masses, thyromegaly, or abnormal cervical nodes Lungs:  clear bilaterally to auscultation, no wheeze Heart:  regular rate and rhythm, S1, S2 without murmurs, rubs, gallops, or clicks Abdomen:  not examined Extremities:  no clubbing, cyanosis, edema, or deformity noted Neurologic:  non-focal Psych:  alert and cooperative; normal mood and affect; normal attention span and concentration   Impression & Recommendations:  Problem # 1:  ALLERGIC RHINITIS (ICD-477.9)  On NSW and nasonex.  - restart loratadine once daily  - trial atrovent nasal spray.   Orders: Est. Patient Level III [id number])  Problem # 2:  SLEEP APNEA (ICD-780.57)  Orders: Est. Patient Level III [id number])  Medications Added to Medication List This Visit: 1)  Plavix 75 Mg Tabs (Clopidogrel bisulfate) .... On hold 2)  Losartan Potassium 50 Mg Tabs (Losartan potassium) .Marland Kitchen.. 1 by mouth daily 3)  Clonazepam 0.5 Mg Tabs (Clonazepam) .... 2 by mouth every morning and 1 by mouth at bedtime 4)  Nasonex 50 Mcg/act Susp (Mometasone furoate) .... 2 by mouth two times a day 5)  Hydrochlorothiazide 25 Mg Tabs (Hydrochlorothiazide) .Marland Kitchen.. 1 by mouth daily 6)  Atrovent 0.03 % Soln (Ipratropium bromide) .... 2 sprays each nostril three times a day as needed for drainage  Patient Instructions: 1)  Continue your nasal saline washes and nasonex two times a day  2)  Restart loratadine 10mg  once daily  3)  Start atrovent nasal spray three times a day as needed for nasal drainage 4)  We will revisit starting your CPAP once your nasal drainage is improved.  5)  Follow up with Dr Lamonte Sakai in 6 months or as needed  Prescriptions: ATROVENT 0.03 % SOLN (IPRATROPIUM BROMIDE) 2 sprays each nostril three times a day as  needed for drainage  #1 x 3   Entered and Authorized by:   Collene Gobble MD   Signed by:   Collene Gobble MD on 01/21/2010   Method used:   Electronically to        [facility]  [phone]* (retail)       [place], [place]  [id number]       Ph: [phone] or [phone]       Fax: [phone]   RxID:   [id number]    Immunization History:  Influenza Immunization History:    Influenza:  historical (10/22/2009)  Pneumovax Immunization History:    Pneumovax:  historical (02/07/2005)

## 2010-04-19 LAB — GLUCOSE, CAPILLARY
Glucose-Capillary: 220 mg/dL — ABNORMAL HIGH (ref 70–99)
Glucose-Capillary: 232 mg/dL — ABNORMAL HIGH (ref 70–99)
Glucose-Capillary: 264 mg/dL — ABNORMAL HIGH (ref 70–99)

## 2010-04-19 LAB — BASIC METABOLIC PANEL
BUN: 17 mg/dL (ref 6–23)
CO2: 30 mEq/L (ref 19–32)
Calcium: 9.6 mg/dL (ref 8.4–10.5)
Chloride: 103 mEq/L (ref 96–112)
Creatinine, Ser: 1.1 mg/dL (ref 0.4–1.2)
GFR calc Af Amer: 59 mL/min — ABNORMAL LOW (ref >60)
GFR calc non Af Amer: 49 mL/min — ABNORMAL LOW (ref >60)
Glucose, Bld: 122 mg/dL — ABNORMAL HIGH (ref 70–99)
Potassium: 3.9 mEq/L (ref 3.5–5.1)
Sodium: 140 mEq/L (ref 135–145)

## 2010-04-28 LAB — DIFFERENTIAL
Basophils Absolute: 0.1 10*3/uL (ref 0.0–0.1)
Basophils Relative: 1 % (ref 0–1)
Eosinophils Absolute: 0.2 10*3/uL (ref 0.0–0.7)
Eosinophils Relative: 2 % (ref 0–5)
Lymphocytes Relative: 28 % (ref 12–46)
Lymphs Abs: 2.5 10*3/uL (ref 0.7–4.0)
Monocytes Absolute: 0.5 10*3/uL (ref 0.1–1.0)
Monocytes Relative: 6 % (ref 3–12)
Neutro Abs: 5.6 10*3/uL (ref 1.7–7.7)
Neutrophils Relative %: 64 % (ref 43–77)

## 2010-04-28 LAB — URINALYSIS, ROUTINE W REFLEX MICROSCOPIC
Bilirubin Urine: NEGATIVE
Glucose, UA: NEGATIVE mg/dL
Hgb urine dipstick: NEGATIVE
Ketones, ur: NEGATIVE mg/dL
Nitrite: NEGATIVE
Protein, ur: NEGATIVE mg/dL
Specific Gravity, Urine: 1.015 (ref 1.005–1.030)
Urobilinogen, UA: 0.2 mg/dL (ref 0.0–1.0)
pH: 5.5 (ref 5.0–8.0)

## 2010-04-28 LAB — POCT CARDIAC MARKERS
CKMB, poc: <1 ng/mL — ABNORMAL LOW (ref 1.0–8.0)
CKMB, poc: <1 ng/mL — ABNORMAL LOW (ref 1.0–8.0)
Myoglobin, poc: 60.2 ng/mL (ref 12–200)
Myoglobin, poc: 88.5 ng/mL (ref 12–200)
Troponin i, poc: <0.05 ng/mL (ref 0.00–0.09)
Troponin i, poc: <0.05 ng/mL (ref 0.00–0.09)

## 2010-04-28 LAB — CBC
HCT: 35 % — ABNORMAL LOW (ref 36.0–46.0)
Hemoglobin: 11.7 g/dL — ABNORMAL LOW (ref 12.0–15.0)
MCHC: 33.5 g/dL (ref 30.0–36.0)
MCV: 94.1 fL (ref 78.0–100.0)
Platelets: 289 10*3/uL (ref 150–400)
RBC: 3.73 MIL/uL — ABNORMAL LOW (ref 3.87–5.11)
RDW: 13.2 % (ref 11.5–15.5)
WBC: 8.8 10*3/uL (ref 4.0–10.5)

## 2010-04-28 LAB — BASIC METABOLIC PANEL
BUN: 13 mg/dL (ref 6–23)
CO2: 26 mEq/L (ref 19–32)
Calcium: 8.5 mg/dL (ref 8.4–10.5)
Chloride: 102 mEq/L (ref 96–112)
Creatinine, Ser: 1.06 mg/dL (ref 0.4–1.2)
GFR calc Af Amer: >60 mL/min (ref >60)
GFR calc non Af Amer: 51 mL/min — ABNORMAL LOW (ref >60)
Glucose, Bld: 181 mg/dL — ABNORMAL HIGH (ref 70–99)
Potassium: 3.5 mEq/L (ref 3.5–5.1)
Sodium: 136 mEq/L (ref 135–145)

## 2010-04-28 LAB — PROTIME-INR
INR: 0.9 (ref 0.00–1.49)
Prothrombin Time: 12.1 seconds (ref 11.6–15.2)

## 2010-04-28 LAB — GLUCOSE, CAPILLARY: Glucose-Capillary: 128 mg/dL — ABNORMAL HIGH (ref 70–99)

## 2010-05-20 LAB — POCT I-STAT, CHEM 8
BUN: 29 mg/dL — ABNORMAL HIGH (ref 6–23)
Calcium, Ion: 1.09 mmol/L — ABNORMAL LOW (ref 1.12–1.32)
Chloride: 103 mEq/L (ref 96–112)
Creatinine, Ser: 1.5 mg/dL — ABNORMAL HIGH (ref 0.4–1.2)
Glucose, Bld: 208 mg/dL — ABNORMAL HIGH (ref 70–99)
HCT: 34 % — ABNORMAL LOW (ref 36.0–46.0)
Hemoglobin: 11.6 g/dL — ABNORMAL LOW (ref 12.0–15.0)
Potassium: 3.4 mEq/L — ABNORMAL LOW (ref 3.5–5.1)
Sodium: 139 mEq/L (ref 135–145)
TCO2: 26 mmol/L (ref 0–100)

## 2010-05-20 LAB — DIFFERENTIAL
Basophils Absolute: 0 10*3/uL (ref 0.0–0.1)
Basophils Relative: 0 % (ref 0–1)
Eosinophils Absolute: 0.2 10*3/uL (ref 0.0–0.7)
Eosinophils Relative: 4 % (ref 0–5)
Lymphocytes Relative: 31 % (ref 12–46)
Lymphs Abs: 1.7 10*3/uL (ref 0.7–4.0)
Monocytes Absolute: 0.3 10*3/uL (ref 0.1–1.0)
Monocytes Relative: 5 % (ref 3–12)
Neutro Abs: 3.4 10*3/uL (ref 1.7–7.7)
Neutrophils Relative %: 60 % (ref 43–77)

## 2010-05-20 LAB — POCT CARDIAC MARKERS
CKMB, poc: <1 ng/mL — ABNORMAL LOW (ref 1.0–8.0)
Myoglobin, poc: 162 ng/mL (ref 12–200)
Troponin i, poc: <0.05 ng/mL (ref 0.00–0.09)

## 2010-05-20 LAB — CBC
HCT: 33.2 % — ABNORMAL LOW (ref 36.0–46.0)
Hemoglobin: 11.1 g/dL — ABNORMAL LOW (ref 12.0–15.0)
MCHC: 33.6 g/dL (ref 30.0–36.0)
MCV: 93.6 fL (ref 78.0–100.0)
Platelets: 280 10*3/uL (ref 150–400)
RBC: 3.54 MIL/uL — ABNORMAL LOW (ref 3.87–5.11)
RDW: 12.8 % (ref 11.5–15.5)
WBC: 5.6 10*3/uL (ref 4.0–10.5)

## 2010-05-20 LAB — GLUCOSE, CAPILLARY: Glucose-Capillary: 39 mg/dL — CL (ref 70–99)

## 2010-05-20 LAB — BRAIN NATRIURETIC PEPTIDE: Pro B Natriuretic peptide (BNP): <30 pg/mL (ref 0.0–100.0)

## 2010-06-09 ENCOUNTER — Other Ambulatory Visit: Payer: Self-pay | Admitting: Diagnostic Neuroimaging

## 2010-06-09 DIAGNOSIS — I6529 Occlusion and stenosis of unspecified carotid artery: Secondary | ICD-10-CM

## 2010-06-09 DIAGNOSIS — R55 Syncope and collapse: Secondary | ICD-10-CM

## 2010-06-14 ENCOUNTER — Other Ambulatory Visit: Payer: Self-pay

## 2010-06-15 ENCOUNTER — Other Ambulatory Visit (HOSPITAL_COMMUNITY): Payer: Self-pay

## 2010-06-18 ENCOUNTER — Ambulatory Visit (HOSPITAL_COMMUNITY)
Admission: RE | Admit: 2010-06-18 | Discharge: 2010-06-18 | Disposition: A | Discharge status: Home / Self Care | Payer: Medicare HMO | Source: Ambulatory Visit | Attending: Diagnostic Neuroimaging | Admitting: Diagnostic Neuroimaging

## 2010-06-18 ENCOUNTER — Other Ambulatory Visit: Payer: Self-pay | Admitting: Diagnostic Neuroimaging

## 2010-06-18 ENCOUNTER — Other Ambulatory Visit (HOSPITAL_COMMUNITY): Payer: Self-pay | Admitting: Diagnostic Neuroimaging

## 2010-06-18 DIAGNOSIS — I779 Disorder of arteries and arterioles, unspecified: Secondary | ICD-10-CM

## 2010-06-18 DIAGNOSIS — I6529 Occlusion and stenosis of unspecified carotid artery: Secondary | ICD-10-CM

## 2010-06-18 DIAGNOSIS — R55 Syncope and collapse: Secondary | ICD-10-CM | POA: Insufficient documentation

## 2010-06-18 MED ORDER — GADOBENATE DIMEGLUMINE 529 MG/ML IV SOLN
20.0000 mL | Freq: Once | INTRAVENOUS | Status: AC
  Administered 2010-06-18: 20 mL via INTRAVENOUS

## 2010-06-22 NOTE — Op Note (Signed)
NAME:  Tara Jackson, Tara Jackson                 ACCOUNT NO.:  [id number]   MEDICAL RECORD NO.:  [id number]          PATIENT TYPE:  AMB   LOCATION:  SDS                          FACILITY:  [facility]   PHYSICIAN:  Anderson Malta, M.D.    DATE OF BIRTH:  [birth year]   DATE OF PROCEDURE:  10/16/2007  DATE OF DISCHARGE:  10/16/2007                               OPERATIVE REPORT   PREOPERATIVE DIAGNOSIS:  Right trigger thumb.   POSTOPERATIVE DIAGNOSIS:  Right trigger thumb.   PROCEDURE:  Right trigger thumb release.   SURGEON:  Anderson Malta, MD   ASSISTANT:  None.   ANESTHESIA:  MAC plus IV regional.   INDICATIONS:  Mr. Melodee Marceau is a 70 year old patient with right  trigger thumb refractory to nonoperative management who presents now for  operative management after explanation of risks and benefits.   PROCEDURE IN DETAIL:  The patient was brought to the operating where MAC  with IV regional anesthetic was induced.  Time-out was called.  Right  thumb was prepped with DuraPrep solution and draped in usual sterile  manner.  Forearm IV regional tourniquet was applied.  Total tourniquet  time was approximately 15 minutes at 215 mmHg.  An incision was made  over the proximal thumb flexion crease.  Skin and subcutaneous tissue  only was divided using a careful dissection.  The neurovascular bundles  on either side of the tendon were identified and retracted.  The  proximal aspect of the A1 pulley was identified.  The pulley was then  divided under direct visualization distally.  The tendon itself had an  hourglass configuration under constriction of the A1 pulley.  The tendon  itself was intact and functional.  Following release of A1 pulley, the  thumb was taken to the range of motion.  The range of motion was  improved.  At this time, tourniquet was released.  Bleeding points were  encountered using bipolar electrocautery.  Anesthetic medicine was  placed to the skin edges, consisting of a 0.5% plain  Marcaine.  Incision  was then closed using 4-0 simple nylon sutures.  Bulky dressing was  applied.  The patient tolerated the procedure well without any immediate  complications.      Anderson Malta, M.D.  Electronically Signed     GSD/MEDQ  D:  10/16/2007  T:  10/17/2007  Job:  [id number]

## 2010-06-22 NOTE — Procedures (Signed)
CAROTID DUPLEX EXAM   INDICATION:  History of CVA.  This is a followup exam.  The patient has  had no new symptoms since her CVA 15 years ago.   HISTORY:  Diabetes:  Yes.  Cardiac:  Yes.  Enlarged heart.  Hypertension:  Yes.  Smoking:  No.  Previous Surgery:  No.  CV History:  The patient had a CVA 15 years ago and had carotid Dopplers  at that time but was not sure of the results.  She has had no surgery.  Amaurosis Fugax No, Paresthesias No, Hemiparesis Yes right hand.                                       RIGHT             LEFT  Brachial systolic pressure:         104               104  Brachial Doppler waveforms:         Biphasic          Biphasic  Vertebral direction of flow:        Antegrade         Antegrade  DUPLEX VELOCITIES (cm/sec)  CCA peak systolic                   151               0000000  ECA peak systolic                   133               [id number]  ICA peak systolic                   92                92  ICA end diastolic                   21                26  PLAQUE MORPHOLOGY:                  Mixed             Mixed  PLAQUE AMOUNT:                      Mild              Mild  PLAQUE LOCATION:                    ECA, ICA          ECA, ICA   IMPRESSION:  1. Mildly elevated velocities noted in both common carotid arteries      with no plaque noted.  2. 20-39% bilateral ICA stenoses.  3. Mild bilateral ECA stenoses.  4. A preliminary copy was faxed to Dr. Keane Police office.   ___________________________________________  Judeth Cornfield. Scot Dock, M.D.   DP/MEDQ  D:  03/16/2007  T:  03/18/2007  Job:  [id number]

## 2010-06-22 NOTE — Procedures (Signed)
DUPLEX DEEP VENOUS EXAM - LOWER EXTREMITY   INDICATION:  Bilateral legs edema.   HISTORY:  Edema:  Yes.  Trauma/Surgery:  No.  Pain:  No.  PE:  Yes.  Previous DVT:  Yes.  Anticoagulants:  (Plavix)  Other:   DUPLEX EXAM:                CFV   SFV   PopV  PTV    GSV                R  L  R  L  R  L  R   L  R  L  Thrombosis    o  o  o  o  o  o  o   o  o  o  Spontaneous   +  +  +  +  +  +  +   +  +  +  Phasic        +  +  +  +  +  +  +   +  +  +  Augmentation  +  +  +  +  +  +  +   +  +  +  Compressible  +  +  +  +  +  +  +   +  +  +  Competent     +  +  +  +  +  +  +   +  +  +   Legend:  + - yes  o - no  p - partial  D - decreased   IMPRESSION:  No evidence of deep venous thrombosis noted in bilateral  legs.    _____________________________  P. Drucie Opitz, M.D.   MG/MEDQ  D:  05/29/2007  T:  05/29/2007  Job:  [id number]

## 2010-06-25 NOTE — H&P (Signed)
NAME:  Tara Jackson, Tara Jackson                           ACCOUNT NO.:  [id number]   MEDICAL RECORD NO.:  [id number]                   PATIENT TYPE:  INP   LOCATION:  [id number]                                 FACILITY:  [facility]   PHYSICIAN:  Burnard Bunting, M.D.               DATE OF BIRTH:  25-Nov-1940   DATE OF ADMISSION:  12/29/2002  DATE OF DISCHARGE:                                HISTORY & PHYSICAL   CHIEF COMPLAINT:  Chest pain and vomiting.   HISTORY OF PRESENT ILLNESS:  Ms. Bernal is a 70 year old female with a history  of diabetes mellitus type 2, hypertension, remote pulmonary embolism,  osteoarthritis, and chronic depression presenting at this time following an  episode of chest pain.  She has no prior cardiac history and has had a  Persantine thallium study three years ago which was negative.  Degree of  hypertensive and diabetic control is not clear.  She has had at least three  pulmonary emboli in the past by her report, on Plavix long-term, she relates  due to Coumadin/liver side effects.  Evidently, she was on a cruise  approximately two weeks ago and did have some difficulty with GI disturbance  which subsequently settled down.  Last evening at approximately 6:00, she  developed onset of nausea and vomiting, multitudes of times leading up to an  onset of chest pain at approximately midnight.  At that time, she had no  vomiting, but only nausea, and the chest pain was a left chest deep  heaviness.  She had minimal associated shortness of breath and this was  dissimilar to her prior pulmonary emboli.  Upon presentation to the  emergency room, nitroglycerin resolved it totally.  At this time, she  continues on with the mild nausea, but no further chest pain.  She does  relate some mild diffuse lower abdominal pain which has been chronic for  several weeks.  She has had no significant fever or chills.  Denies any  headache or dizziness.  Denies any arm radiation.  Had no bloody emesis or  melanotic stools.  She was admitted with atypical type chest pain for rule  out myocardial infarction.   INTOLERANCES:  1. SULFA.  2. IODINE.  3. LACTOSE.   MEDICATIONS:  1. Plavix 75 mg daily.  2. Actos 30 mg daily.  3. Klonopin two p.o. q.a.m.  4. Glucotrol 5 mg q.a.m.  5. Sliding scale Novolog.  6. Celexa 10 mg q.h.s.  7. Pamelor, dose unknown, nightly.  8. Nexium 40 q. day.  9. Diovan 320 q.a.m.  10.      Darvocet p.r.n.   PAST MEDICAL HISTORY:  1. Osteoarthritis.  2. Degenerative disk disease with spinal stenosis.  3. Diabetes mellitus type 2.  4. Hypertension.  5. Multiple pulmonary emboli.  6. Chronic depression.  7. Relates prior TIA's.  8. Gastroesophageal reflux disease.   PAST SURGICAL HISTORY:  1.  Tonsillectomy.  2. Right knee replacement.  3. Bilateral breast biopsies.  4. Hysterectomy.   FAMILY HISTORY:  Remarkable for hypertension and cardiac disease.   SOCIAL HISTORY:  The patient is accompanied by her sister.  She is married.  Does not smoke or drink.   PHYSICAL EXAMINATION:  VITAL SIGNS:  Temperature is 98.7, blood pressure  140/50, pulse 68, respiratory rate is 18.  Weight 295 pounds, height 5 feet  7 inches.  CBG is 170.  O2 saturation is 97% on room air.  GENERAL:  The patient is pleasant, calm, in no acute distress, supine in  bed.  She is morbidly obese.  HEENT:  Anicteric.  Extraocular movements intact.  NECK:  No JVD or bruits.  LUNGS:  Clear bilaterally.  CARDIOVASCULAR:  Regular rate and rhythm, no murmur, rub, gallop, or heave.  ABDOMEN:  Soft, obese, nontender, no hepatosplenomegaly.  Midline incision  healed.  EXTREMITIES:  Reveal 1+ edema.  Calves soft and nontender.  Intact distal  pulses.  NEUROLOGIC:  Non-lateralizing.   LABORATORY DATA:  EKG:  Normal sinus rhythm, left atrial enlargement.   ASSESSMENT:  1. Chest pain, atypical, and clearly on the tail end of a gastrointestinal     exacerbation, but multiple risk factors  and prior history of pulmonary     embolism.  Therefore, the patient will have a spiral CT scan to rule out     pulmonary embolism, empiric Lovenox, and continue with serial cardiac     enzymes.  If all of this is negative, she will be discharged for     outpatient Cardiolite and/or gallbladder ultrasound.  2. Essential hypertension, stable.  3. Diabetes mellitus type 2, stable.  4. Osteoarthritis and degenerative disk disease with spinal stenosis.   PLAN:  As above.                                               Burnard Bunting, M.D.    RA/MEDQ  D:  12/29/2002  T:  12/29/2002  Job:  [id number]

## 2010-06-25 NOTE — Procedures (Signed)
Tara Jackson, Tara Jackson NO.:  [id number]   MEDICAL RECORD NO.:  [id number]          PATIENT TYPE:  OUT   LOCATION:  SLEEP CENTER                 FACILITY:  [facility]   PHYSICIAN:  Chesley Mires, MD        DATE OF BIRTH:  March 06, 1940   DATE OF STUDY:  07/14/2008                            NOCTURNAL POLYSOMNOGRAM   REFERRING PHYSICIAN:  Collene Gobble, MD   FACILITY:  [facility] PHYSICIAN:  Collene Gobble, MD   INDICATION:  [name] is a 70 year old female who has a history of  diabetes, hypertension, asthma and thromboembolic disease.  She also has  symptoms of sleep disruption and excessive daytime sleepiness.  She is  referred to the Sleep Lab for evaluation of hypersomnia with obstructive  sleep apnea.   Height is 5 feet 7 inches, weight is 300 pounds.  BMI is 47.  Neck size  17 inches.   EPWORTH SCORE:  Epworth score is 7.   MEDICATIONS:  Levoxyl, furosemide, tioconazole, Nexium, glipizide,  nortriptyline, Diovan, TriCor, clonazepam, NovoLog, atenolol, Plavix,  Mentax, Lidoderm patch, and ex-lax.   SLEEP ARCHITECTURE:  Total recording time was 375 minutes.  Total sleep  time was 272 minutes.  Sleep efficiency was 73%.  Sleep latency was 80  minutes.  REM latency was 189 minutes.  The study was notable for lack  of slow-wave sleep, and a reduction in the percentage of REM sleep.  The  patient was slept predominately in the nonsupine position.   RESPIRATORY DATA:  The average respiratory rate was 18.  There was mild-  to-moderate snoring noted by the technician.  The respiratory  disturbance index was 26.2.  The events were exclusively obstructive in  nature.  There did appear to be a predominance of respiratory events in  the supine position.   OXYGEN DATA:  The baseline oxygenation was 97%.  The oxygen saturation  nadir was 90%.   CARDIAC DATA:  The average heart rate was 67 and the rhythm strip showed  normal sinus rhythm.   MOVEMENT-PARASOMNIA:  The periodic limb movement index was 0 and the  patient had 1 rest room trip.   IMPRESSION:  This study shows evidence for moderate obstructive sleep  apnea with a respiratory disturbance index of 26.2.   In addition to diet, exercise, and weight reduction, additional  therapeutic interventions could include CPAP therapy, oral appliance, or  surgical intervention.      Chesley Mires, MD  Diplomat, American Board of Sleep Medicine  Electronically Signed     VS/MEDQ  D:  07/14/2008 07:48:29  T:  07/14/2008 23:12:46  Job:  [id number]   cc:   Collene Gobble, MD  [address]  [place], [place] [address]

## 2010-06-25 NOTE — Discharge Summary (Signed)
NAMEYAZLYN, Jackson                           ACCOUNT NO.:  [id number]   MEDICAL RECORD NO.:  [id number]                   PATIENT TYPE:  INP   LOCATION:  [id number]                                 FACILITY:  [facility]   PHYSICIAN:  [name], M.D.                 DATE OF BIRTH:  1940/06/29   DATE OF ADMISSION:  12/29/2002  DATE OF DISCHARGE:  01/01/2003                                 DISCHARGE SUMMARY   PRIMARY CARE PHYSICIAN:  [name], M.D.   DISCHARGE DIAGNOSES:  1. Atypical chest pain.  2. Shortness of breath.  3. Dehydration.  4. Constipation.  5. Nausea and vomiting.  6. Osteoarthritis.  7. Degenerative disk disease.  8. Diabetes mellitus type 2.  9. Hypertension.  10.      History of pulmonary emboli.  11.      Depression and anxiety.  12.      History of transient ischemic attacks.  13.      Gastroesophageal reflux disease.  14.      Total abdominal hysterectomy.  15.      Tonsillectomy.  16.      Right knee replacement.  17.      Bilateral breast biopsies.  18.      Asthma.   DISCHARGE MEDICATIONS:  1. Diovan 160 daily.  2. Actos 30 daily.  3. Plavix 75 daily.  4. Nexium 40 daily.  5. Klonopin 1 mg, two in the a.m. and one at bedtime.  6. Ativan supplemental.  7. Celexa 20 daily.  8. K-Dur 20 mEq p.o. daily.  9. Combivent q.i.d.  10.      Albuterol as needed.  11.      Pamelor 25 at bedtime.  12.      Novolin regular as before, may need to increase bolus for increased     blood sugar secondary to recent prednisone use.  13.      Glucotrol XL 5 daily.  14.      Oxytrol.   PROCEDURES:  1. Telemetry monitoring.  2. CKs and Troponin-Is ruling out myocardial infarction.  3. Nuclear perfusion study, which was intermediate.  4. CT pulmonary angiogram, which was negative for pulmonary embolus.  5. Chest x-ray with mild pleural effusion and cardiomegaly.   HISTORY OF PRESENT ILLNESS:  Briefly Tara Jackson is a 70 year old  female with multiple  medical problems who had a negative persantine/  Cardiolite about three years ago per Dr. Doylene Canard, who presented to medical  attention on the night of December 29, 2002, with chest pain, shortness of  breath, nausea and vomiting.  In the emergency room this was resolved with  nitroglycerin.  It was felt that she needed to come in for further  evaluation and treatment.   HOSPITAL COURSE:  Dr. Reynaldo Minium admitted [name] with the above mentioned  complaints and gave her symptomatic treatment and  placed her on Lovenox for  the possibility of pulmonary emboli.  Since she has an IODINE allergy, we  were initially going to do a CT pulmonary angiogram and this was changed  over to a VQ scan.  Unfortunately, the VQ scan was indeterminate and it was  determined that she needed to go on to a CT pulmonary angiogram with her  history of multiple PEs.  The CT pulmonary angiogram was negative for any  acute pulmonary emboli.  This was finally determined on January 01, 2003,  and she was taken off the Lovenox and allowed to walk around, and as soon as  she was deemed medically stable she was allowed to go home.  Unfortunately,  she had to get prednisone as part of her premedication and this raised her  blood sugars.  She has been given heavy boluses of insulin to keep her blood  sugars under control.   Of note, her nausea and vomiting improved after a good bowel movement.  As  for her atypical chest pain, she will get an outpatient stress test.  She  did rule out for myocardial infarction with CK and Troponin and without any  new changes on her EKG.   Her diabetes was hard to control secondary to the prednisone.  Blood  pressure was controlled throughout her hospital stay after initial bump.  Her potassium was noted to be low and this has been replaced.   She was out of bed all day today.  She is in her street clothes, she is off  telemetry, she is medically stable and ready for discharge.    FOLLOWUP INSTRUCTIONS:  She will get an outpatient stress test.  She will  get a colonoscopy in a week or two for anemia.  She will be referred to  Urology for urge incontinence.  She will follow up with me in a couple of  weeks.  She has been offered nutrition and diabetic management classes.                                               [name], M.D.    JMR/MEDQ  D:  01/01/2003  T:  01/01/2003  Job:  [id number]   cc:   Birdie Riddle, M.D.  [address]  [place] [address]

## 2010-06-25 NOTE — Op Note (Signed)
NAME:  Tara Jackson, Tara Jackson                           ACCOUNT NO.:  [id number]   MEDICAL RECORD NO.:  [id number]                   PATIENT TYPE:  AMB   LOCATION:  ENDO                                 FACILITY:  [facility]   PHYSICIAN:  Jeryl Columbia, M.D.                 DATE OF BIRTH:  11-Mar-1940   DATE OF PROCEDURE:  01/09/2003  DATE OF DISCHARGE:                                 OPERATIVE REPORT   PROCEDURE:  Colonoscopy with biopsy.   INDICATIONS FOR PROCEDURE:  A patient with multiple GI complaints, history  of colon polyps.   Consent was signed after risks, benefits, methods, and options were  thoroughly discussed with both her and her husband in the office.   MEDICINES USED:  Demerol 120, Versed 10.   DESCRIPTION OF PROCEDURE:  Rectal inspection was pertinent for small  external hemorrhoids. Digital exam was  negative. The video colonoscope was inserted and with abdominal pressure  easily able to be advanced to the splenic flexure. At that point, there was  looping and we rolled her on her back and we were easily able to advance to  the level of the ileocecal valve. We could advance to half of the cecal pole  but there was a sharp angulation at the bottom of the cecum and despite  abdominal pressure and rolling her on both her right and left sides, could  not advance deep into the cecal pole so we elected to withdraw. No obvious  abnormality was seen on insertion. On slow withdrawal, the prep was  adequate. There was some liquid stool that required washing and suctioning  but other than a tiny distal sigmoid polyp cold biopsied x2 no other  abnormalities were seen. Anal rectal pull through and retroflexion confirmed  some small hemorrhoids and an anal papillae. The scope was reinserted a  short ways up the left side of the colon, air was suctioned, scope removed.  The patient tolerated the procedure well. There was no obvious or immediate  complications.   ENDOSCOPIC DIAGNOSES:  1.  Internal and external hemorrhoids with an anal papillae.  2. Tiny sigmoid distal polyp cold biopsied.  3. Otherwise within normal limits with half the cecum being seen well.   PLAN:  Await pathology. Consider repeat screening in five years. Might even  want to do a virtual colonoscopy at that juncture if widely available.  Followup with me p.r.n. or in two months to recheck symptoms and decide any  other workup and plan. Possibly an upper GI small bowel follow through or  even CT scan.                                               Jeryl Columbia, M.D.    MEM/MEDQ  D:  01/09/2003  T:  01/09/2003  Job:  [id number]   cc:   Burnard Bunting, M.D.  [address]  [place]  [place] [address]  Fax: [phone]

## 2010-06-25 NOTE — Op Note (Signed)
NAME:  Tara Jackson, Tara Jackson NO.:  [id number]   MEDICAL RECORD NO.:  [id number]          PATIENT TYPE:  AMB   LOCATION:  ENDO                         FACILITY:  [facility]   PHYSICIAN:  Jeryl Columbia, M.D.    DATE OF BIRTH:  Nov 17, 1940   DATE OF PROCEDURE:  DATE OF DISCHARGE:                                 OPERATIVE REPORT   PROCEDURE:  Esophagogastroduodenoscopy.   INDICATIONS:  Dysphagia that is currently improved.  Anemia, questionable  previous gastrointestinal bleeding.  Consent was signed after risks,  benefits and options thoroughly discussed multiple times in the past.   MEDICINES USED:  Additional medicines for this procedure since it followed a  colonoscopy was 2 of Versed only.   PROCEDURE:  The videoendoscope was inserted by direct vision.  A quick look  at the posterior pharynx was normal.  The esophagus was normal.  There was a  small hiatal hernia.  The scope passed into the stomach and advanced through  the antrum pertinent for some minimal antritis and advanced through a normal  pylorus into a normal duodenal bulb and around the C-loop where a small  white patch was seen.  Some of these are adenomatous polyps and a few  biopsies were obtained.  No other duodenal abnormality was seen.  The scope  was slowly withdrawn back to the bulb which was normal.  The scope was  withdrawn back to the stomach and retroflexed finding the cardia and  __________  was confirmed.  The fundus, angularis, lesser and greater curve  were normal in retroflex visualization. Straight visualization of the  stomach did not reveal any additional findings.  Air was suctioned, scope  slowly withdrawn again.  A good look at the esophagus and a quick look at  the posterior pharynx was all normal.  Scope was removed.  The patient  tolerated the procedure well.  There was no obvious complications.   ENDOSCOPIC DIAGNOSES:  1. Small hiatal hernia.  2. Minimal antritis.  3. Duodenal white  nodule in the second portion of the duodenum status post      biopsy.  4. Otherwise, within normal limits esophagogastroduodenoscopy.   PLAN:  We will await biopsies to see if future endoscopies are needed.  Continue Nexium.  Follow up with me p.r.n. or in 2-3 months to recheck  symptoms and make sure no further workup plans are needed.  Call sooner  p.r.n.           ______________________________  Jeryl Columbia, M.D.     MEM/MEDQ  D:  11/01/2005  T:  11/01/2005  Job:  [id number]   cc:   [name], MD

## 2010-06-25 NOTE — Op Note (Signed)
NAMESAVAHNA, FAULCONER NO.:  [id number]   MEDICAL RECORD NO.:  [id number]          PATIENT TYPE:  AMB   LOCATION:  ENDO                         FACILITY:  [facility]   PHYSICIAN:  Jeryl Columbia, M.D.    DATE OF BIRTH:  [birth year]   DATE OF PROCEDURE:  11/01/2005  DATE OF DISCHARGE:                                 OPERATIVE REPORT   PROCEDURE:  Colonoscopy.   INDICATIONS:  Patient with anemia, worsening constipation, questionable  recent GI bleed, due for colonic screening.  Consent was signed prior to any  pre-meds given, after risks, benefits, methods, options thoroughly discussed  multiple times in the past.   MEDICINES USED:  Fentanyl 150 mcg, Versed 15 mg.   PROCEDURE IN DETAIL:  Rectal inspection pertinent for external hemorrhoids,  small.  Digital exam was negative.  The video colonoscope was inserted and  with some difficulty based on her body habitus, some looping and tortuosity,  with rolling her on her back and various abdominal pressures, we were able  to advance to the cecum which was identified by the appendiceal orifice and  the ileocecal valve.  No signs of bleeding or abnormalities were seen on  insertion.  The prep was fairly adequate; with lots of flushing and  suctioning, adequate visualization was obtained.  Prep was worse on the  right than on the left.  The scope was drawn through the colon.  Other than  rare, tiny early left-sided diverticula, no polyps, tumors, masses, or signs  of bleeding were seen.  We slowly drew back to the rectum.  Anorectal pull-  through and retroflexing confirmed some small hemorrhoids.  The scope was  straightened, re-advanced a short way up the left side of the colon, air was  suctioned and scope removed.  Patient tolerated the procedure well.  There  was no obvious immediate complication.   ENDOSCOPIC DIAGNOSES:  1. Internal and external small hemorrhoids.  2. Rare left-sided diverticula, early.  3. Otherwise  within normal limits to the cecum.   PLAN:  Consider virtual colonoscopy next time if __________  screening.  Continue workup with an EGD.           ______________________________  Jeryl Columbia, M.D.     MEM/MEDQ  D:  11/01/2005  T:  11/01/2005  Job:  [id number]   cc:   Dr. Darcus Austin???  Karleen Hampshire???

## 2010-09-14 ENCOUNTER — Other Ambulatory Visit: Payer: Self-pay | Admitting: Internal Medicine

## 2010-09-14 DIAGNOSIS — Z1231 Encounter for screening mammogram for malignant neoplasm of breast: Secondary | ICD-10-CM

## 2010-09-24 ENCOUNTER — Emergency Department (HOSPITAL_COMMUNITY)
Admission: EM | Admit: 2010-09-24 | Discharge: 2010-09-25 | Disposition: A | Discharge status: Home / Self Care | Payer: Medicare HMO | Attending: Emergency Medicine | Admitting: Emergency Medicine

## 2010-09-24 ENCOUNTER — Emergency Department (HOSPITAL_COMMUNITY): Payer: Medicare HMO

## 2010-09-24 DIAGNOSIS — I10 Essential (primary) hypertension: Secondary | ICD-10-CM | POA: Insufficient documentation

## 2010-09-24 DIAGNOSIS — H8110 Benign paroxysmal vertigo, unspecified ear: Secondary | ICD-10-CM | POA: Insufficient documentation

## 2010-09-24 DIAGNOSIS — Z8673 Personal history of transient ischemic attack (TIA), and cerebral infarction without residual deficits: Secondary | ICD-10-CM | POA: Insufficient documentation

## 2010-09-24 DIAGNOSIS — E039 Hypothyroidism, unspecified: Secondary | ICD-10-CM | POA: Insufficient documentation

## 2010-09-24 DIAGNOSIS — R51 Headache: Secondary | ICD-10-CM | POA: Insufficient documentation

## 2010-09-24 DIAGNOSIS — Z86718 Personal history of other venous thrombosis and embolism: Secondary | ICD-10-CM | POA: Insufficient documentation

## 2010-09-24 DIAGNOSIS — E119 Type 2 diabetes mellitus without complications: Secondary | ICD-10-CM | POA: Insufficient documentation

## 2010-09-24 DIAGNOSIS — Z79899 Other long term (current) drug therapy: Secondary | ICD-10-CM | POA: Insufficient documentation

## 2010-09-24 LAB — POCT I-STAT, CHEM 8
BUN: 23 mg/dL (ref 6–23)
Calcium, Ion: 1.13 mmol/L (ref 1.12–1.32)
Chloride: 101 mEq/L (ref 96–112)
Creatinine, Ser: 1.1 mg/dL (ref 0.50–1.10)
Glucose, Bld: 157 mg/dL — ABNORMAL HIGH (ref 70–99)
HCT: 39 % (ref 36.0–46.0)
Hemoglobin: 13.3 g/dL (ref 12.0–15.0)
Potassium: 3.8 mEq/L (ref 3.5–5.1)
Sodium: 140 mEq/L (ref 135–145)
TCO2: 27 mmol/L (ref 0–100)

## 2010-10-14 ENCOUNTER — Ambulatory Visit
Admission: RE | Admit: 2010-10-14 | Discharge: 2010-10-14 | Disposition: A | Discharge status: Home / Self Care | Payer: Medicare HMO | Source: Ambulatory Visit | Attending: Internal Medicine | Admitting: Internal Medicine

## 2010-10-14 DIAGNOSIS — Z1231 Encounter for screening mammogram for malignant neoplasm of breast: Secondary | ICD-10-CM

## 2010-10-15 ENCOUNTER — Other Ambulatory Visit: Payer: Self-pay | Admitting: Internal Medicine

## 2010-10-15 DIAGNOSIS — N63 Unspecified lump in unspecified breast: Secondary | ICD-10-CM

## 2010-10-21 ENCOUNTER — Ambulatory Visit
Admission: RE | Admit: 2010-10-21 | Discharge: 2010-10-21 | Disposition: A | Discharge status: Home / Self Care | Payer: Medicare HMO | Source: Ambulatory Visit | Attending: Internal Medicine | Admitting: Internal Medicine

## 2010-10-21 ENCOUNTER — Other Ambulatory Visit: Payer: Self-pay | Admitting: Internal Medicine

## 2010-10-21 DIAGNOSIS — N63 Unspecified lump in unspecified breast: Secondary | ICD-10-CM

## 2010-11-04 LAB — DIFFERENTIAL
Basophils Absolute: 0
Basophils Relative: 1
Eosinophils Absolute: 0.2
Eosinophils Relative: 4
Lymphocytes Relative: 35
Lymphs Abs: 2
Monocytes Absolute: 0.4
Monocytes Relative: 7
Neutro Abs: 3.2
Neutrophils Relative %: 54

## 2010-11-04 LAB — POCT CARDIAC MARKERS
CKMB, poc: <1 — ABNORMAL LOW
CKMB, poc: <1 — ABNORMAL LOW
Myoglobin, poc: 56.8
Myoglobin, poc: 63.2
Operator id: 275321
Operator id: 294521
Troponin i, poc: <0.05
Troponin i, poc: <0.05

## 2010-11-04 LAB — CBC
HCT: 31 — ABNORMAL LOW
Hemoglobin: 10.8 — ABNORMAL LOW
MCHC: 34.7
MCV: 92.1
Platelets: 298
RBC: 3.36 — ABNORMAL LOW
RDW: 13.2
WBC: 5.8

## 2010-11-04 LAB — COMPREHENSIVE METABOLIC PANEL
ALT: 17
AST: 19
Albumin: 3.4 — ABNORMAL LOW
Alkaline Phosphatase: 68
BUN: 13
CO2: 25
Calcium: 8.7
Chloride: 106
Creatinine, Ser: 0.95
GFR calc Af Amer: >60
GFR calc non Af Amer: 59 — ABNORMAL LOW
Glucose, Bld: 158 — ABNORMAL HIGH
Potassium: 3.8
Sodium: 141
Total Bilirubin: 0.6
Total Protein: 6.3

## 2010-11-04 LAB — B-NATRIURETIC PEPTIDE (CONVERTED LAB): Pro B Natriuretic peptide (BNP): 44

## 2010-11-04 LAB — D-DIMER, QUANTITATIVE: D-Dimer, Quant: 0.27

## 2010-11-04 LAB — APTT: aPTT: 29

## 2010-11-10 LAB — CBC
HCT: 34.7 — ABNORMAL LOW
Hemoglobin: 11.8 — ABNORMAL LOW
MCHC: 33.9
MCV: 91.8
Platelets: 341
RBC: 3.78 — ABNORMAL LOW
RDW: 13.4
WBC: 9

## 2010-11-10 LAB — BASIC METABOLIC PANEL
BUN: 24 — ABNORMAL HIGH
CO2: 26
Calcium: 9.6
Chloride: 103
Creatinine, Ser: 1.17
GFR calc Af Amer: 55 — ABNORMAL LOW
GFR calc non Af Amer: 46 — ABNORMAL LOW
Glucose, Bld: 224 — ABNORMAL HIGH
Potassium: 4.2
Sodium: 138

## 2010-11-10 LAB — APTT: aPTT: 28

## 2010-11-10 LAB — GLUCOSE, CAPILLARY
Glucose-Capillary: 158 — ABNORMAL HIGH
Glucose-Capillary: 180 — ABNORMAL HIGH

## 2010-11-10 LAB — PROTIME-INR
INR: 1
Prothrombin Time: 13.3

## 2010-11-19 LAB — COMPREHENSIVE METABOLIC PANEL
ALT: 14
AST: 27
Albumin: 3.8
Alkaline Phosphatase: 75
BUN: 18
CO2: 26
Calcium: 8.8
Chloride: 102
Creatinine, Ser: 1.27 — ABNORMAL HIGH
GFR calc Af Amer: 51 — ABNORMAL LOW
GFR calc non Af Amer: 42 — ABNORMAL LOW
Glucose, Bld: 204 — ABNORMAL HIGH
Potassium: 3.9
Sodium: 137
Total Bilirubin: 0.8
Total Protein: 7.1

## 2010-11-19 LAB — URINALYSIS, ROUTINE W REFLEX MICROSCOPIC
Bilirubin Urine: NEGATIVE
Glucose, UA: NEGATIVE
Hgb urine dipstick: NEGATIVE
Ketones, ur: NEGATIVE
Nitrite: NEGATIVE
Protein, ur: NEGATIVE
Specific Gravity, Urine: 1.025
Urobilinogen, UA: 0.2
pH: 5

## 2010-11-19 LAB — DIFFERENTIAL
Basophils Absolute: 0.1
Basophils Relative: 1
Eosinophils Absolute: 0.1
Eosinophils Relative: 2
Lymphocytes Relative: 32
Lymphs Abs: 2
Monocytes Absolute: 0.4
Monocytes Relative: 6
Neutro Abs: 3.8
Neutrophils Relative %: 60

## 2010-11-19 LAB — D-DIMER, QUANTITATIVE: D-Dimer, Quant: 0.35

## 2010-11-19 LAB — LIPASE, BLOOD: Lipase: 15

## 2010-11-19 LAB — CBC
HCT: 33.5 — ABNORMAL LOW
Hemoglobin: 11.4 — ABNORMAL LOW
MCHC: 34
MCV: 91.5
Platelets: 353
RBC: 3.66 — ABNORMAL LOW
RDW: 13.4
WBC: 6.3

## 2010-11-23 ENCOUNTER — Other Ambulatory Visit: Payer: Self-pay | Admitting: Gastroenterology

## 2010-11-26 ENCOUNTER — Ambulatory Visit
Admission: RE | Admit: 2010-11-26 | Discharge: 2010-11-26 | Disposition: A | Discharge status: Home / Self Care | Payer: Medicare HMO | Source: Ambulatory Visit | Attending: Gastroenterology | Admitting: Gastroenterology

## 2010-11-26 ENCOUNTER — Other Ambulatory Visit: Payer: Self-pay | Admitting: Gastroenterology

## 2010-12-05 ENCOUNTER — Emergency Department (HOSPITAL_COMMUNITY): Payer: Medicare HMO

## 2010-12-05 ENCOUNTER — Emergency Department (HOSPITAL_COMMUNITY)
Admission: EM | Admit: 2010-12-05 | Discharge: 2010-12-05 | Disposition: A | Discharge status: Home / Self Care | Payer: Medicare HMO | Attending: Emergency Medicine | Admitting: Emergency Medicine

## 2010-12-05 DIAGNOSIS — E7800 Pure hypercholesterolemia, unspecified: Secondary | ICD-10-CM | POA: Insufficient documentation

## 2010-12-05 DIAGNOSIS — R079 Chest pain, unspecified: Secondary | ICD-10-CM | POA: Insufficient documentation

## 2010-12-05 DIAGNOSIS — I10 Essential (primary) hypertension: Secondary | ICD-10-CM | POA: Insufficient documentation

## 2010-12-05 DIAGNOSIS — E119 Type 2 diabetes mellitus without complications: Secondary | ICD-10-CM | POA: Insufficient documentation

## 2010-12-05 DIAGNOSIS — Z79899 Other long term (current) drug therapy: Secondary | ICD-10-CM | POA: Insufficient documentation

## 2010-12-05 DIAGNOSIS — R109 Unspecified abdominal pain: Secondary | ICD-10-CM | POA: Insufficient documentation

## 2010-12-05 DIAGNOSIS — M545 Low back pain, unspecified: Secondary | ICD-10-CM | POA: Insufficient documentation

## 2010-12-05 DIAGNOSIS — Z86718 Personal history of other venous thrombosis and embolism: Secondary | ICD-10-CM | POA: Insufficient documentation

## 2010-12-05 DIAGNOSIS — R0989 Other specified symptoms and signs involving the circulatory and respiratory systems: Secondary | ICD-10-CM | POA: Insufficient documentation

## 2010-12-05 DIAGNOSIS — Z794 Long term (current) use of insulin: Secondary | ICD-10-CM | POA: Insufficient documentation

## 2010-12-05 DIAGNOSIS — R0609 Other forms of dyspnea: Secondary | ICD-10-CM | POA: Insufficient documentation

## 2010-12-05 DIAGNOSIS — E039 Hypothyroidism, unspecified: Secondary | ICD-10-CM | POA: Insufficient documentation

## 2010-12-05 DIAGNOSIS — R0602 Shortness of breath: Secondary | ICD-10-CM | POA: Insufficient documentation

## 2010-12-05 DIAGNOSIS — R110 Nausea: Secondary | ICD-10-CM | POA: Insufficient documentation

## 2010-12-05 DIAGNOSIS — Z8673 Personal history of transient ischemic attack (TIA), and cerebral infarction without residual deficits: Secondary | ICD-10-CM | POA: Insufficient documentation

## 2010-12-05 LAB — DIFFERENTIAL
Basophils Absolute: 0 10*3/uL (ref 0.0–0.1)
Basophils Relative: 1 % (ref 0–1)
Eosinophils Absolute: 0.1 10*3/uL (ref 0.0–0.7)
Eosinophils Relative: 2 % (ref 0–5)
Lymphocytes Relative: 28 % (ref 12–46)
Lymphs Abs: 1.9 10*3/uL (ref 0.7–4.0)
Monocytes Absolute: 0.3 10*3/uL (ref 0.1–1.0)
Monocytes Relative: 4 % (ref 3–12)
Neutro Abs: 4.5 10*3/uL (ref 1.7–7.7)
Neutrophils Relative %: 66 % (ref 43–77)

## 2010-12-05 LAB — PROTIME-INR
INR: 0.89 (ref 0.00–1.49)
Prothrombin Time: 12.2 seconds (ref 11.6–15.2)

## 2010-12-05 LAB — BASIC METABOLIC PANEL
BUN: 12 mg/dL (ref 6–23)
CO2: 28 mEq/L (ref 19–32)
Calcium: 9 mg/dL (ref 8.4–10.5)
Chloride: 98 mEq/L (ref 96–112)
Creatinine, Ser: 0.8 mg/dL (ref 0.50–1.10)
GFR calc Af Amer: 83 mL/min — ABNORMAL LOW (ref >90)
GFR calc non Af Amer: 72 mL/min — ABNORMAL LOW (ref >90)
Glucose, Bld: 299 mg/dL — ABNORMAL HIGH (ref 70–99)
Potassium: 4.1 mEq/L (ref 3.5–5.1)
Sodium: 137 mEq/L (ref 135–145)

## 2010-12-05 LAB — CBC
HCT: 34.7 % — ABNORMAL LOW (ref 36.0–46.0)
Hemoglobin: 11.5 g/dL — ABNORMAL LOW (ref 12.0–15.0)
MCH: 30.8 pg (ref 26.0–34.0)
MCHC: 33.1 g/dL (ref 30.0–36.0)
MCV: 93 fL (ref 78.0–100.0)
Platelets: 273 10*3/uL (ref 150–400)
RBC: 3.73 MIL/uL — ABNORMAL LOW (ref 3.87–5.11)
RDW: 12.7 % (ref 11.5–15.5)
WBC: 6.9 10*3/uL (ref 4.0–10.5)

## 2010-12-05 LAB — URINALYSIS, MICROSCOPIC ONLY
Bilirubin Urine: NEGATIVE
Glucose, UA: 250 mg/dL — AB
Hgb urine dipstick: NEGATIVE
Ketones, ur: NEGATIVE mg/dL
Leukocytes, UA: NEGATIVE
Nitrite: NEGATIVE
Protein, ur: NEGATIVE mg/dL
Specific Gravity, Urine: 1.01 (ref 1.005–1.030)
Urobilinogen, UA: 0.2 mg/dL (ref 0.0–1.0)
pH: 6 (ref 5.0–8.0)

## 2010-12-05 LAB — HEPATIC FUNCTION PANEL
ALT: 12 U/L (ref 0–35)
AST: 14 U/L (ref 0–37)
Albumin: 3.2 g/dL — ABNORMAL LOW (ref 3.5–5.2)
Alkaline Phosphatase: 88 U/L (ref 39–117)
Bilirubin, Direct: <0.1 mg/dL (ref 0.0–0.3)
Total Bilirubin: 0.3 mg/dL (ref 0.3–1.2)
Total Protein: 6.8 g/dL (ref 6.0–8.3)

## 2010-12-05 LAB — LIPASE, BLOOD: Lipase: 21 U/L (ref 11–59)

## 2010-12-05 LAB — POCT I-STAT TROPONIN I: Troponin i, poc: 0 ng/mL (ref 0.00–0.08)

## 2010-12-05 LAB — GLUCOSE, CAPILLARY: Glucose-Capillary: 240 mg/dL — ABNORMAL HIGH (ref 70–99)

## 2010-12-05 MED ORDER — XENON XE 133 GAS
10.0000 | GAS_FOR_INHALATION | Freq: Once | RESPIRATORY_TRACT | Status: AC | PRN
  Administered 2010-12-05: 11 via RESPIRATORY_TRACT

## 2010-12-05 MED ORDER — TECHNETIUM TO 99M ALBUMIN AGGREGATED
6.0000 | Freq: Once | INTRAVENOUS | Status: AC | PRN
  Administered 2010-12-05: 6 via INTRAVENOUS

## 2011-01-14 ENCOUNTER — Other Ambulatory Visit: Payer: Self-pay | Admitting: Gastroenterology

## 2011-08-07 ENCOUNTER — Emergency Department (HOSPITAL_COMMUNITY)
Admission: EM | Admit: 2011-08-07 | Discharge: 2011-08-08 | Disposition: A | Discharge status: Home / Self Care | Payer: Medicare HMO | Attending: Emergency Medicine | Admitting: Emergency Medicine

## 2011-08-07 ENCOUNTER — Other Ambulatory Visit: Payer: Self-pay

## 2011-08-07 ENCOUNTER — Encounter (HOSPITAL_COMMUNITY): Payer: Self-pay | Admitting: Emergency Medicine

## 2011-08-07 DIAGNOSIS — Z794 Long term (current) use of insulin: Secondary | ICD-10-CM | POA: Insufficient documentation

## 2011-08-07 DIAGNOSIS — R079 Chest pain, unspecified: Secondary | ICD-10-CM | POA: Insufficient documentation

## 2011-08-07 DIAGNOSIS — I2510 Atherosclerotic heart disease of native coronary artery without angina pectoris: Secondary | ICD-10-CM | POA: Insufficient documentation

## 2011-08-07 DIAGNOSIS — K2970 Gastritis, unspecified, without bleeding: Secondary | ICD-10-CM

## 2011-08-07 DIAGNOSIS — K2990 Gastroduodenitis, unspecified, without bleeding: Secondary | ICD-10-CM | POA: Insufficient documentation

## 2011-08-07 DIAGNOSIS — E119 Type 2 diabetes mellitus without complications: Secondary | ICD-10-CM | POA: Insufficient documentation

## 2011-08-07 HISTORY — DX: Unspecified asthma, uncomplicated: J45.909

## 2011-08-07 HISTORY — DX: Acute embolism and thrombosis of unspecified deep veins of unspecified lower extremity: I82.409

## 2011-08-07 HISTORY — DX: Cerebral infarction, unspecified: I63.9

## 2011-08-07 LAB — GLUCOSE, CAPILLARY: Glucose-Capillary: 233 mg/dL — ABNORMAL HIGH (ref 70–99)

## 2011-08-07 NOTE — ED Notes (Signed)
Pt alert, nad, arrives from home, c/o mid sternal chest pain, onset this evening,  radiating to back and left arm, describes as a burning, resp even unlabored, c/o sob, "jittery", pt hx DM

## 2011-08-08 ENCOUNTER — Encounter (HOSPITAL_COMMUNITY): Payer: Self-pay

## 2011-08-08 ENCOUNTER — Ambulatory Visit (HOSPITAL_COMMUNITY)
Admission: RE | Admit: 2011-08-08 | Discharge: 2011-08-08 | Disposition: A | Discharge status: Home / Self Care | Payer: Medicare HMO | Source: Ambulatory Visit | Attending: Gastroenterology | Admitting: Gastroenterology

## 2011-08-08 ENCOUNTER — Emergency Department (HOSPITAL_COMMUNITY): Payer: Medicare HMO

## 2011-08-08 LAB — CBC
HCT: 34.9 % — ABNORMAL LOW (ref 36.0–46.0)
Hemoglobin: 11.5 g/dL — ABNORMAL LOW (ref 12.0–15.0)
MCH: 30.7 pg (ref 26.0–34.0)
MCHC: 33 g/dL (ref 30.0–36.0)
MCV: 93.1 fL (ref 78.0–100.0)
Platelets: 263 10*3/uL (ref 150–400)
RBC: 3.75 MIL/uL — ABNORMAL LOW (ref 3.87–5.11)
RDW: 12.7 % (ref 11.5–15.5)
WBC: 8.3 10*3/uL (ref 4.0–10.5)

## 2011-08-08 LAB — POCT I-STAT, CHEM 8
BUN: 23 mg/dL (ref 6–23)
Calcium, Ion: 1.13 mmol/L (ref 1.12–1.32)
Chloride: 101 mEq/L (ref 96–112)
Creatinine, Ser: 1 mg/dL (ref 0.50–1.10)
Glucose, Bld: 241 mg/dL — ABNORMAL HIGH (ref 70–99)
HCT: 35 % — ABNORMAL LOW (ref 36.0–46.0)
Hemoglobin: 11.9 g/dL — ABNORMAL LOW (ref 12.0–15.0)
Potassium: 4 mEq/L (ref 3.5–5.1)
Sodium: 138 mEq/L (ref 135–145)
TCO2: 25 mmol/L (ref 0–100)

## 2011-08-08 LAB — POCT I-STAT TROPONIN I: Troponin i, poc: 0 ng/mL (ref 0.00–0.08)

## 2011-08-08 MED ORDER — FAMOTIDINE IN NACL 20-0.9 MG/50ML-% IV SOLN
20.0000 mg | Freq: Once | INTRAVENOUS | Status: AC
  Administered 2011-08-08: 20 mg via INTRAVENOUS
  Filled 2011-08-08: qty 50

## 2011-08-08 MED ORDER — SODIUM CHLORIDE 0.9 % IV SOLN
Freq: Once | INTRAVENOUS | Status: AC
  Administered 2011-08-08: 01:00:00 via INTRAVENOUS

## 2011-08-08 MED ORDER — FAMOTIDINE 20 MG PO TABS: 20.0000 mg | ORAL_TABLET | ORAL | Status: DC

## 2011-08-08 NOTE — ED Notes (Signed)
Dr. Miller @ bedside.

## 2011-08-08 NOTE — Discharge Instructions (Signed)
Gastritis Gastritis is an irritation of the stomach. It can be caused by anything that bothers the stomach. Some irritants include:  Alcohol.   Caffeine.   Nicotine.   Spicy, acidic, greasy, and fried foods.   Medicines for pain and arthritis.   Emotional distress.  HOME CARE   Only take medicine as told by your doctor.   Take small sips of clear liquids often. Do not drink large amounts of liquids at one time.   If you have watery poop (diarrhea), avoid milk, fruit, tobacco, alcohol, really hot or cold liquids, or too much of anything at one time.   Rest.   Wash your hands often.   Once you can keep clear liquids down, start soups, juices, apple sauce, crackers, and sherbet. Slowly add plain, not spicy, foods to your diet.  GET HELP RIGHT AWAY IF:   You cannot keep fluids down.   You cannot stop throwing up (vomiting) or you throw up blood.   You have more stomach or chest pain.   You have a temperature by mouth above 102 F (38.9 C), not controlled by medicine.   You pass out (faint), feel lightheaded, or are more thirsty than normal.   You have bloody or black poop (stools).   Your watery poop will not go away.   You are not improving or are getting worse.  MAKE SURE YOU:   Understand these instructions.   Will watch your condition.   Will get help right away if you are not doing well or get worse.  Document Released: 07/13/2007 Document Revised: 01/13/2011 Document Reviewed: 12/12/2008 [facility] Patient Information 2012 [place]. Please take a Pepcid daily, keep your appointment with the gastroenterologist as scheduled

## 2011-08-08 NOTE — ED Notes (Signed)
Patient transported to X-ray 

## 2011-08-08 NOTE — ED Notes (Signed)
71 year old female with a complaint of epigastric pain which is burning in nature, radiating to the back which started this evening. She had complete resolution of her pain after receiving intravenous Pepcid in the emergency department.  Physical exam shows a soft nontender abdomen, clear lung sounds, clear heart sounds without murmur, strong peripheral pulses, no tachycardia or peripheral edema or asymmetry of the lower extremities.  EKG is normal with no signs of ischemia, tachycardia or right heart strain.  Lab work unremarkable as well, patient given results, has expressed her understanding for the indications for return, start Pepcid nightly, patient to resume Plavix which she takes for chronic anticoagulation after endoscopy on Tuesday. The patient is hemodynamically stable and and symptom-free at discharge  Medical screening examination/treatment/procedure(s) were conducted as a shared visit with non-physician practitioner(s) and myself.  I personally evaluated the patient during the encounter    Johnna Acosta, MD 08/08/11 [phone]

## 2011-08-08 NOTE — ED Notes (Signed)
Pt returns to room 

## 2011-08-08 NOTE — ED Notes (Signed)
Lab bedside.

## 2011-08-08 NOTE — ED Provider Notes (Signed)
Medical screening examination/treatment/procedure(s) were conducted as a shared visit with non-physician practitioner(s) and myself.  I personally evaluated the patient during the encounter  Please see my separate respective documentation pertaining to this patient encounter   Johnna Acosta, MD 08/08/11 [phone]

## 2011-08-08 NOTE — ED Provider Notes (Signed)
History     CSN: [id number]  Arrival date & time 08/07/11  2342   First MD Initiated Contact with Patient 08/08/11 0015      Chief Complaint  Patient presents with  . Chest Pain    (Consider location/radiation/quality/duration/timing/severity/associated sxs/prior treatment) HPI Comments: patien thad acute onset of CP with radiation to mid back Also report diarrhea for several days and thinks she may be dehydrated She is scheduled for an endoscopy on 2 days for "stomach problems"  Sh has a runny nose so took several doses of Amoxicillin because she did not want to have a cold for the procedure   Patient is a 71 y.o. female presenting with chest pain. The history is provided by the patient.  Chest Pain The chest pain began 1 - 2 hours ago. Chest pain occurs constantly. The chest pain is unchanged. At its most intense, the pain is at 5/10. The pain is currently at 5/10. The severity of the pain is moderate. The quality of the pain is described as sharp. The pain radiates to the mid back. Pertinent negatives for primary symptoms include no fatigue, no shortness of breath, no cough, no abdominal pain, no nausea, no vomiting and no dizziness.  Pertinent negatives for associated symptoms include no numbness.     Past Medical History  Diagnosis Date  . Diabetes mellitus   . Coronary artery disease   . Stroke   . DVT (deep venous thrombosis)   . Asthma     Past Surgical History  Procedure Date  . Abdominal hysterectomy   . Cholecystectomy   . Joint replacement   . Back surgery     No family history on file.  History  Substance Use Topics  . Smoking status: Never Smoker   . Smokeless tobacco: Not on file  . Alcohol Use: No    OB History    Grav Para Term Preterm Abortions TAB SAB Ect Mult Living                  Review of Systems  Constitutional: Negative for fatigue.  HENT: Positive for rhinorrhea. Negative for congestion.   Respiratory: Negative for cough and  shortness of breath.   Cardiovascular: Positive for chest pain. Negative for leg swelling.  Gastrointestinal: Positive for diarrhea. Negative for nausea, vomiting and abdominal pain.  Neurological: Negative for dizziness and numbness.    Allergies  Iodine; Iohexol; Lansoprazole; and Metoclopramide hcl  Home Medications   Current Outpatient Rx  Name Route Sig Dispense Refill  . CLONAZEPAM 0.5 MG PO TABS Oral Take 0.5-1 mg by mouth 2 (two) times daily as needed. For anxiety. Take 2 tablets every morning & 1 tablet at bedtime.    . CLOPIDOGREL BISULFATE 75 MG PO TABS Oral Take 75 mg by mouth daily.    Marland Kitchen ESOMEPRAZOLE MAGNESIUM 40 MG PO CPDR Oral Take 40 mg by mouth daily before breakfast.    . FUROSEMIDE 20 MG PO TABS Oral Take 20 mg by mouth daily as needed. For increased fluid    . GLIPIZIDE 10 MG PO TABS Oral Take 20 mg by mouth 2 (two) times daily before a meal.    . HYDROCHLOROTHIAZIDE 12.5 MG PO CAPS Oral Take 12.5 mg by mouth daily.    Marland Kitchen HYOSCYAMINE SULFATE 0.125 MG PO TABS Oral Take 0.125 mg by mouth every 4 (four) hours as needed. For irritable bowel syndrome    . INSULIN ISOPHANE HUMAN 100 UNIT/ML  SUSP Subcutaneous Inject 30 Units  into the skin every morning.    . INSULIN REGULAR HUMAN 100 UNIT/ML IJ SOLN Subcutaneous Inject 20 Units into the skin 3 (three) times daily before meals.    Marland Kitchen MECLIZINE HCL 25 MG PO TABS Oral Take 25 mg by mouth daily as needed. For vertigo    . ROSUVASTATIN CALCIUM 20 MG PO TABS Oral Take 20 mg by mouth daily.    Marland Kitchen VALSARTAN 160 MG PO TABS Oral Take 160 mg by mouth daily.    Marland Kitchen FAMOTIDINE 20 MG PO TABS Oral Take 1 tablet (20 mg total) by mouth 1 day or 1 dose. 30 tablet 0    BP 126/55  Pulse 61  Temp 98 F (36.7 C) (Oral)  Resp 17  SpO2 96%  Physical Exam  Constitutional: She is oriented to person, place, and time. She appears well-developed and well-nourished.  HENT:  Head: Normocephalic.  Neck: Normal range of motion.  Cardiovascular:  Normal rate.   Pulmonary/Chest: Effort normal and breath sounds normal. No respiratory distress. She has no wheezes.  Abdominal: Soft. Bowel sounds are normal. She exhibits no distension. There is no tenderness.  Musculoskeletal: Normal range of motion.  Neurological: She is alert and oriented to person, place, and time.  Skin: Skin is warm.    ED Course  Procedures (including critical care time)  Labs Reviewed  GLUCOSE, CAPILLARY - Abnormal; Notable for the following:    Glucose-Capillary 233 (*)     All other components within normal limits  CBC - Abnormal; Notable for the following:    RBC 3.75 (*)     Hemoglobin 11.5 (*)     HCT 34.9 (*)     All other components within normal limits  POCT I-STAT, CHEM 8 - Abnormal; Notable for the following:    Glucose, Bld 241 (*)     Hemoglobin 11.9 (*)     HCT 35.0 (*)     All other components within normal limits  POCT I-STAT TROPONIN I   Dg Chest 2 View  08/08/2011  *RADIOLOGY REPORT*  Clinical Data: Chest pain.  CHEST - 2 VIEW  Comparison: 12/05/2010  Findings: Borderline heart size.  Lungs clear.  No effusions.  No acute bony abnormality.  IMPRESSION: No acute findings.  Original Report Authenticated By: Raelyn Number, M.D.     1. Gastritis     ED ECG REPORT   Date: 08/08/2011  EKG Time: 3:54 AM  Rate: 87  Rhythm: normal sinus rhythm,  unchanged from previous tracings  Axis: normal  Intervals:first-degree A-V block   ST&T Change: none  Narrative Interpretation: borderline EKG            MDM  Gastritis Patient is pain free after IV Pepcid will DC home with same        Garald Balding, NP 08/08/11 0354  Garald Balding, NP 08/08/11 [id number]

## 2011-08-09 ENCOUNTER — Ambulatory Visit (HOSPITAL_COMMUNITY)
Admission: RE | Admit: 2011-08-09 | Discharge: 2011-08-09 | Disposition: A | Discharge status: Home / Self Care | Payer: Medicare HMO | Source: Ambulatory Visit | Attending: Gastroenterology | Admitting: Gastroenterology

## 2011-08-09 ENCOUNTER — Ambulatory Visit (HOSPITAL_COMMUNITY): Payer: Medicare HMO | Admitting: Anesthesiology

## 2011-08-09 ENCOUNTER — Encounter (HOSPITAL_COMMUNITY): Payer: Self-pay | Admitting: Anesthesiology

## 2011-08-09 ENCOUNTER — Encounter (HOSPITAL_COMMUNITY)
Admission: RE | Disposition: A | Discharge status: Home / Self Care | Payer: Self-pay | Source: Ambulatory Visit | Attending: Gastroenterology

## 2011-08-09 ENCOUNTER — Encounter (HOSPITAL_COMMUNITY): Payer: Self-pay | Admitting: Gastroenterology

## 2011-08-09 DIAGNOSIS — K2990 Gastroduodenitis, unspecified, without bleeding: Secondary | ICD-10-CM | POA: Insufficient documentation

## 2011-08-09 DIAGNOSIS — K2970 Gastritis, unspecified, without bleeding: Secondary | ICD-10-CM | POA: Insufficient documentation

## 2011-08-09 DIAGNOSIS — D1330 Benign neoplasm of unspecified part of small intestine: Secondary | ICD-10-CM | POA: Insufficient documentation

## 2011-08-09 DIAGNOSIS — K449 Diaphragmatic hernia without obstruction or gangrene: Secondary | ICD-10-CM | POA: Insufficient documentation

## 2011-08-09 HISTORY — PX: ESOPHAGOGASTRODUODENOSCOPY: SHX5428

## 2011-08-09 HISTORY — PX: HOT HEMOSTASIS: SHX5433

## 2011-08-09 LAB — GLUCOSE, CAPILLARY: Glucose-Capillary: 177 mg/dL — ABNORMAL HIGH (ref 70–99)

## 2011-08-09 SURGERY — EGD (ESOPHAGOGASTRODUODENOSCOPY)
Anesthesia: Monitor Anesthesia Care

## 2011-08-09 MED ORDER — LACTATED RINGERS IV SOLN
INTRAVENOUS | Status: DC
  Administered 2011-08-09: 1000 mL via INTRAVENOUS

## 2011-08-09 MED ORDER — KETAMINE HCL 10 MG/ML IJ SOLN
INTRAMUSCULAR | Status: DC | PRN
Start: 2011-08-09 — End: 2011-08-09
  Administered 2011-08-09 (×2): 10 mg via INTRAVENOUS

## 2011-08-09 MED ORDER — FENTANYL CITRATE 0.05 MG/ML IJ SOLN
INTRAMUSCULAR | Status: DC | PRN
  Administered 2011-08-09 (×2): 50 ug via INTRAVENOUS

## 2011-08-09 MED ORDER — BUTAMBEN-TETRACAINE-BENZOCAINE 2-2-14 % EX AERO
INHALATION_SPRAY | CUTANEOUS | Status: DC | PRN
  Administered 2011-08-09: 2 via TOPICAL

## 2011-08-09 MED ORDER — MIDAZOLAM HCL 5 MG/5ML IJ SOLN
INTRAMUSCULAR | Status: DC | PRN
  Administered 2011-08-09 (×2): 1 mg via INTRAVENOUS

## 2011-08-09 MED ORDER — PROPOFOL 10 MG/ML IV EMUL
INTRAVENOUS | Status: DC | PRN
  Administered 2011-08-09: 75 ug/kg/min via INTRAVENOUS

## 2011-08-09 NOTE — Transfer of Care (Signed)
Immediate Anesthesia Transfer of Care Note  Patient: Tiahna Shiers  Procedure(s) Performed: Procedure(s) (LRB): ESOPHAGOGASTRODUODENOSCOPY (EGD) (N/A) HOT HEMOSTASIS (ARGON PLASMA COAGULATION/BICAP) (N/A)  Patient Location: PACU and Endoscopy Unit  Anesthesia Type: MAC  Level of Consciousness: awake, alert  and patient cooperative  Airway & Oxygen Therapy: Patient Spontanous Breathing and Patient connected to nasal cannula oxygen  Post-op Assessment: Report given to PACU RN and Post -op Vital signs reviewed and stable  Post vital signs: Reviewed and stable  Complications: No apparent anesthesia complications

## 2011-08-09 NOTE — Op Note (Signed)
[facility] [place], [place]  [address]  ENDOSCOPY PROCEDURE REPORT  PATIENT:  Tara, Jackson  MR#:  [id number] BIRTHDATE:  03/16/1940, 70 yrs. old  GENDER:  female  ENDOSCOPIST:  Clarene Essex, MD Referred by:  Shon Baton, M.D.  PROCEDURE DATE:  08/09/2011 PROCEDURE:  EGD with biopsy, 43239 ASA CLASS:  Class II INDICATIONS:  recheck duodenal polyp  MEDICATIONS:  180 mg propofol 2 mg Versed 100 mcg fentanyl TOPICAL ANESTHETIC: Used  DESCRIPTION OF PROCEDURE:   After the risks benefits and alternatives of the procedure were thoroughly explained, informed consent was obtained.  The [id number]) endoscope was introduced through the mouth and advanced to the second portion of the duodenum, without limitations.  The instrument was slowly withdrawn as the mucosa was fully examined. The findings are recorded below there was a small residual duodenal polyp unfortunately we could not snare despite multiple attempts and we elected to take multiple cold biopsied and there were no other significant finding and the patient tolerated the procedure adequately there was no obvious immediate complications <<PROCEDUREIMAGES>>  FINDINGS: 1. Small hiatal hernia 2. Mild gastritis throughout 3. Small residual second portion of the duodenum polyp unable to snare status post cold biopsy 4. Otherwise within normal limits EGD COMPLICATIONS: None  ENDOSCOPIC IMPRESSION:  Above  RECOMMENDATIONS: Await pathology followup with me when necessary would continue Nexium and use extra Pepcid when necessary  REPEAT EXAM:  Pending pathology one to 2 years  ______________________________ Clarene Essex, MD  CC:  Shon Baton, MD  n. Lorrin MaisClarene Essex at 08/09/2011 09:06 AM  Georgina Pillion, [id number]

## 2011-08-09 NOTE — Anesthesia Preprocedure Evaluation (Signed)
Anesthesia Evaluation  Patient identified by MRN, date of birth, ID band Patient awake    Reviewed: Allergy & Precautions, H&P , NPO status , Patient's Chart, lab work & pertinent test results  Airway Mallampati: II TM Distance: <3 FB Neck ROM: Full    Dental No notable dental hx.    Pulmonary sleep apnea ,  breath sounds clear to auscultation  + decreased breath sounds      Cardiovascular hypertension, Pt. on medications + CAD Rhythm:Regular Rate:Normal     Neuro/Psych CVA negative psych ROS   GI/Hepatic negative GI ROS, Neg liver ROS,   Endo/Other  Diabetes mellitus-, Insulin DependentMorbid obesity  Renal/GU negative Renal ROS  negative genitourinary   Musculoskeletal negative musculoskeletal ROS (+)   Abdominal   Peds negative pediatric ROS (+)  Hematology negative hematology ROS (+)   Anesthesia Other Findings   Reproductive/Obstetrics negative OB ROS                           Anesthesia Physical Anesthesia Plan  ASA: III  Anesthesia Plan: MAC   Post-op Pain Management:    Induction: Intravenous  Airway Management Planned: Nasal Cannula  Additional Equipment:   Intra-op Plan:   Post-operative Plan:   Informed Consent: I have reviewed the patients History and Physical, chart, labs and discussed the procedure including the risks, benefits and alternatives for the proposed anesthesia with the patient or authorized representative who has indicated his/her understanding and acceptance.     Plan Discussed with: CRNA  Anesthesia Plan Comments:         Anesthesia Quick Evaluation

## 2011-08-09 NOTE — Anesthesia Postprocedure Evaluation (Signed)
  Anesthesia Post-op Note  Patient: Tara Jackson  Procedure(s) Performed: Procedure(s) (LRB): ESOPHAGOGASTRODUODENOSCOPY (EGD) (N/A) HOT HEMOSTASIS (ARGON PLASMA COAGULATION/BICAP) (N/A)  Patient Location: PACU  Anesthesia Type: MAC  Level of Consciousness: awake and alert   Airway and Oxygen Therapy: Patient Spontanous Breathing  Post-op Pain: mild  Post-op Assessment: Post-op Vital signs reviewed, Patient's Cardiovascular Status Stable, Respiratory Function Stable, Patent Airway and No signs of Nausea or vomiting  Post-op Vital Signs: stable  Complications: No apparent anesthesia complications

## 2011-08-09 NOTE — Discharge Instructions (Addendum)
Call for biopsies in 1 week and call sooner if question or problem and followup as needed and continue Nexium and use extra Pepcid only as needed  Endoscopy Care After Please read the instructions outlined below and refer to this sheet in the next few weeks. These discharge instructions provide you with general information on caring for yourself after you leave the hospital. Your doctor may also give you specific instructions. While your treatment has been planned according to the most current medical practices available, unavoidable complications occasionally occur. If you have any problems or questions after discharge, please call your doctor. HOME CARE INSTRUCTIONS Activity  You may resume your regular activity but move at a slower pace for the next 24 hours.   Take frequent rest periods for the next 24 hours.   Walking will help expel (get rid of) the air and reduce the bloated feeling in your abdomen.   No driving for 24 hours (because of the anesthesia (medicine) used during the test).   You may shower.   Do not sign any important legal documents or operate any machinery for 24 hours (because of the anesthesia used during the test).  Nutrition  Drink plenty of fluids.   You may resume your normal diet.   Begin with a light meal and progress to your normal diet.   Avoid alcoholic beverages for 24 hours or as instructed by your caregiver.  Medications You may resume your normal medications unless your caregiver tells you otherwise. What you can expect today  You may experience abdominal discomfort such as a feeling of fullness or "gas" pains.   You may experience a sore throat for 2 to 3 days. This is normal. Gargling with salt water may help this.  Follow-up Your doctor will discuss the results of your test with you. SEEK IMMEDIATE MEDICAL CARE IF:  You have excessive nausea (feeling sick to your stomach) and/or vomiting.   You have severe abdominal pain and distention  (swelling).   You have trouble swallowing.   You have a temperature over 100 F (37.8 C).   You have rectal bleeding or vomiting of blood.  Document Released: 09/08/2003 Document Revised: 01/13/2011 Document Reviewed: 03/21/2007 [facility] Patient Information 2012 [place].

## 2011-08-09 NOTE — Progress Notes (Signed)
Tara Jackson 7:52 AM  Subjective: Patient was in the emergency room recently been diagnosed as gastritis which was short-lived midepigastric discomfort in her ER Summary wasreviewedAnd she is currently without complaint and has not had any other medical issues since we've seen her in the office  Objective: Vital signs stable afebrile no acute distress exam in preop procedure evaluation  Assessment: Multiple medical problems and duodenal polyp  Plan: Okay for anesthesia and repeat endoscopy and possibly biopsy or polypectomy of the duodenal polyp  Tara Jackson E

## 2011-08-10 ENCOUNTER — Encounter (HOSPITAL_COMMUNITY): Payer: Self-pay | Admitting: Gastroenterology

## 2011-10-25 ENCOUNTER — Encounter (HOSPITAL_COMMUNITY): Payer: Self-pay | Admitting: Family Medicine

## 2011-10-25 ENCOUNTER — Emergency Department (HOSPITAL_COMMUNITY)
Admission: EM | Admit: 2011-10-25 | Discharge: 2011-10-25 | Disposition: A | Discharge status: Home / Self Care | Payer: Medicare HMO | Attending: Emergency Medicine | Admitting: Emergency Medicine

## 2011-10-25 ENCOUNTER — Emergency Department (HOSPITAL_COMMUNITY): Payer: Medicare HMO

## 2011-10-25 DIAGNOSIS — R079 Chest pain, unspecified: Secondary | ICD-10-CM | POA: Insufficient documentation

## 2011-10-25 DIAGNOSIS — K529 Noninfective gastroenteritis and colitis, unspecified: Secondary | ICD-10-CM

## 2011-10-25 DIAGNOSIS — F410 Panic disorder [episodic paroxysmal anxiety] without agoraphobia: Secondary | ICD-10-CM

## 2011-10-25 DIAGNOSIS — R0602 Shortness of breath: Secondary | ICD-10-CM | POA: Insufficient documentation

## 2011-10-25 DIAGNOSIS — Z8673 Personal history of transient ischemic attack (TIA), and cerebral infarction without residual deficits: Secondary | ICD-10-CM | POA: Insufficient documentation

## 2011-10-25 DIAGNOSIS — K5289 Other specified noninfective gastroenteritis and colitis: Secondary | ICD-10-CM | POA: Insufficient documentation

## 2011-10-25 DIAGNOSIS — I2510 Atherosclerotic heart disease of native coronary artery without angina pectoris: Secondary | ICD-10-CM | POA: Insufficient documentation

## 2011-10-25 DIAGNOSIS — E119 Type 2 diabetes mellitus without complications: Secondary | ICD-10-CM | POA: Insufficient documentation

## 2011-10-25 DIAGNOSIS — Z794 Long term (current) use of insulin: Secondary | ICD-10-CM | POA: Insufficient documentation

## 2011-10-25 LAB — URINE MICROSCOPIC-ADD ON

## 2011-10-25 LAB — GLUCOSE, CAPILLARY
Glucose-Capillary: 329 mg/dL — ABNORMAL HIGH (ref 70–99)
Glucose-Capillary: 296 mg/dL — ABNORMAL HIGH (ref 70–99)

## 2011-10-25 LAB — CBC
HCT: 36.8 % (ref 36.0–46.0)
Hemoglobin: 12.1 g/dL (ref 12.0–15.0)
MCH: 30.3 pg (ref 26.0–34.0)
MCHC: 32.9 g/dL (ref 30.0–36.0)
MCV: 92.2 fL (ref 78.0–100.0)
Platelets: 273 10*3/uL (ref 150–400)
RBC: 3.99 MIL/uL (ref 3.87–5.11)
RDW: 12.8 % (ref 11.5–15.5)
WBC: 6.9 10*3/uL (ref 4.0–10.5)

## 2011-10-25 LAB — BASIC METABOLIC PANEL
BUN: 13 mg/dL (ref 6–23)
CO2: 24 mEq/L (ref 19–32)
Calcium: 9 mg/dL (ref 8.4–10.5)
Chloride: 98 mEq/L (ref 96–112)
Creatinine, Ser: 0.89 mg/dL (ref 0.50–1.10)
GFR calc Af Amer: 73 mL/min — ABNORMAL LOW (ref >90)
GFR calc non Af Amer: 63 mL/min — ABNORMAL LOW (ref >90)
Glucose, Bld: 343 mg/dL — ABNORMAL HIGH (ref 70–99)
Potassium: 4 mEq/L (ref 3.5–5.1)
Sodium: 134 mEq/L — ABNORMAL LOW (ref 135–145)

## 2011-10-25 LAB — URINALYSIS, ROUTINE W REFLEX MICROSCOPIC
Bilirubin Urine: NEGATIVE
Glucose, UA: >1000 mg/dL — AB
Hgb urine dipstick: NEGATIVE
Ketones, ur: NEGATIVE mg/dL
Leukocytes, UA: NEGATIVE
Nitrite: NEGATIVE
Protein, ur: NEGATIVE mg/dL
Specific Gravity, Urine: 1.028 (ref 1.005–1.030)
Urobilinogen, UA: 0.2 mg/dL (ref 0.0–1.0)
pH: 5.5 (ref 5.0–8.0)

## 2011-10-25 LAB — TROPONIN I: Troponin I: <0.3 ng/mL (ref <0.30)

## 2011-10-25 LAB — PRO B NATRIURETIC PEPTIDE: Pro B Natriuretic peptide (BNP): 69.2 pg/mL (ref 0–125)

## 2011-10-25 MED ORDER — ONDANSETRON HCL 4 MG PO TABS: 4.0000 mg | ORAL_TABLET | Freq: Four times a day (QID) | ORAL | Status: DC

## 2011-10-25 MED ORDER — INSULIN REGULAR HUMAN 100 UNIT/ML IJ SOLN: 8.0000 [IU] | Freq: Once | INTRAMUSCULAR | Status: DC

## 2011-10-25 MED ORDER — SODIUM CHLORIDE 0.9 % IV BOLUS (SEPSIS)
1000.0000 mL | Freq: Once | INTRAVENOUS | Status: AC
  Administered 2011-10-25: 1000 mL via INTRAVENOUS

## 2011-10-25 MED ORDER — LORAZEPAM 1 MG PO TABS: 1.0000 mg | ORAL_TABLET | Freq: Three times a day (TID) | ORAL | Status: DC | PRN

## 2011-10-25 MED ORDER — ONDANSETRON HCL 4 MG/2ML IJ SOLN
4.0000 mg | Freq: Once | INTRAMUSCULAR | Status: AC
  Administered 2011-10-25: 4 mg via INTRAVENOUS
  Filled 2011-10-25: qty 2

## 2011-10-25 MED ORDER — INSULIN ASPART 100 UNIT/ML ~~LOC~~ SOLN
8.0000 [IU] | Freq: Once | SUBCUTANEOUS | Status: AC
  Administered 2011-10-25: 8 [IU] via SUBCUTANEOUS
  Filled 2011-10-25: qty 1

## 2011-10-25 MED ORDER — LORAZEPAM 2 MG/ML IJ SOLN
1.0000 mg | Freq: Once | INTRAMUSCULAR | Status: AC
  Administered 2011-10-25: 1 mg via INTRAVENOUS
  Filled 2011-10-25: qty 1

## 2011-10-25 NOTE — ED Notes (Signed)
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

## 2011-10-25 NOTE — ED Notes (Signed)
Pt states she is having mid sternal chest pain and pain to arms. Pt states this is typical when she has an anxiety attack. Pt calm, cooperative. Family at bedside.

## 2011-10-25 NOTE — ED Provider Notes (Addendum)
History     CSN: [id number]  Arrival date & time 10/25/11  1441   First MD Initiated Contact with Patient 10/25/11 1537      Chief Complaint  Patient presents with  . Chest Pain  . Shortness of Breath  . Nausea  . Emesis  . Diarrhea    (Consider location/radiation/quality/duration/timing/severity/associated sxs/prior treatment) The history is provided by the patient and medical records.   71 year old, morbidly obese, female, with history of diabetes, prior stroke, and pulmonary embolus, presents to emergency department complaining of nausea, vomiting, and diarrhea, and dehydration.  She states that she has had the vomiting, and diarrhea for the past week.  Therefore, she is concerned she is dehydrated.  She has also recently completed a course of Levaquin for "pneumonia.".  She states that she has a history of severe anxiety attacks and because she has been sick over the past week.  She has developed an anxiety attack, which causes her to have chest pain, and shortness of breath she denies fevers, chills, or rash.  She denies blood in her emesis or stool.  She is very anxious, tremulous, and repeatedly asks me not to leave her because she is anxious  Past Medical History  Diagnosis Date  . Diabetes mellitus   . Coronary artery disease   . Stroke   . DVT (deep venous thrombosis)   . Asthma   . Pulmonary embolus     Past Surgical History  Procedure Date  . Abdominal hysterectomy   . Cholecystectomy   . Joint replacement   . Back surgery     steroid inj  . Tonsillectomy   . Appendectomy   . Esophagogastroduodenoscopy 08/09/2011    Procedure: ESOPHAGOGASTRODUODENOSCOPY (EGD);  Surgeon: Jeryl Columbia, MD;  Location: [facility];  Service: Endoscopy;  Laterality: N/A;  . Hot hemostasis 08/09/2011    Procedure: HOT HEMOSTASIS (ARGON PLASMA COAGULATION/BICAP);  Surgeon: Jeryl Columbia, MD;  Location: [facility];  Service: Endoscopy;  Laterality: N/A;    History reviewed. No  pertinent family history.  History  Substance Use Topics  . Smoking status: Never Smoker   . Smokeless tobacco: Not on file  . Alcohol Use: No    OB History    Grav Para Term Preterm Abortions TAB SAB Ect Mult Living                  Review of Systems  Constitutional: Negative for fever and chills.  HENT: Negative for neck pain.   Eyes: Negative for visual disturbance.  Respiratory: Positive for shortness of breath. Negative for cough and chest tightness.   Cardiovascular: Positive for chest pain. Negative for palpitations and leg swelling.  Gastrointestinal: Positive for nausea, vomiting and diarrhea. Negative for abdominal pain.  Genitourinary: Negative for dysuria.  Musculoskeletal: Negative for back pain.  Skin: Negative for rash.  Neurological: Negative for headaches.  Hematological: Does not bruise/bleed easily.  Psychiatric/Behavioral: Negative for confusion.       Anxious  All other systems reviewed and are negative.    Allergies  Iodine; Metoclopramide hcl; Sulfa antibiotics; Iohexol; Lactose intolerance (gi); and Lansoprazole  Home Medications   Current Outpatient Rx  Name Route Sig Dispense Refill  . AMOXICILLIN 500 MG PO CAPS Oral Take 500 mg by mouth 3 (three) times daily.    Marland Kitchen EMETROL 1.87-1.87-21.5 PO SOLN Oral Take 15 mLs by mouth every 15 (fifteen) minutes as needed. nausea    . ASPIRIN 325 MG PO TABS Oral Take 325 mg  by mouth once.    . BUSPIRONE HCL 7.5 MG PO TABS Oral Take 7.5 mg by mouth 2 (two) times daily.    Marland Kitchen CAPSAICIN 0.025 % EX CREA Topical Apply 1 application topically 2 (two) times daily as needed. appies to feet for burning    . CLONAZEPAM 0.5 MG PO TABS Oral Take 0.5-1 mg by mouth 2 (two) times daily. For anxiety. Take 2 tablets every morning & 1 tablet at bedtime.    . CLOPIDOGREL BISULFATE 75 MG PO TABS Oral Take 75 mg by mouth daily.    Marland Kitchen DEXTROMETHORPHAN POLISTIREX ER 30 MG/5ML PO LQCR Oral Take 60 mg by mouth as needed. cough    .  ESOMEPRAZOLE MAGNESIUM 40 MG PO CPDR Oral Take 40 mg by mouth daily before breakfast.    . FUROSEMIDE 20 MG PO TABS Oral Take 20 mg by mouth daily as needed. For increased fluid    . GLIPIZIDE 10 MG PO TABS Oral Take 20 mg by mouth 2 (two) times daily before a meal.    . HYDROCHLOROTHIAZIDE 12.5 MG PO CAPS Oral Take 12.5 mg by mouth daily.    Marland Kitchen HYOSCYAMINE SULFATE 0.125 MG PO TABS Oral Take 0.125 mg by mouth every 4 (four) hours as needed. For irritable bowel syndrome    . INSULIN ISOPHANE HUMAN 100 UNIT/ML [place] SUSP Subcutaneous Inject 30 Units into the skin every morning.    . INSULIN REGULAR HUMAN 100 UNIT/ML IJ SOLN Subcutaneous Inject 20 Units into the skin 3 (three) times daily before meals.    Marland Kitchen LACTULOSE 10 GM/15ML PO SOLN Oral Take 20 g by mouth daily as needed. constipation    . LOPERAMIDE HCL 2 MG PO CAPS Oral Take 2 mg by mouth 4 (four) times daily as needed. diarrhea    . MECLIZINE HCL 25 MG PO TABS Oral Take 25 mg by mouth daily as needed. For vertigo    . POLYETHYLENE GLYCOL 3350 PO PACK Oral Take 17 g by mouth daily as needed. constipation    . ROSUVASTATIN CALCIUM 20 MG PO TABS Oral Take 20 mg by mouth daily.    . TERCONAZOLE 0.4 % VA CREA Vaginal Place 1 applicator vaginally daily as needed. Yeast infection    . VALSARTAN 160 MG PO TABS Oral Take 160 mg by mouth daily.      BP 135/63  Temp 98.5 F (36.9 C) (Oral)  Resp 21  SpO2 98%  Physical Exam  Nursing note and vitals reviewed. Constitutional: She is oriented to person, place, and time. No distress.       Morbidly obese  HENT:  Head: Normocephalic and atraumatic.  Eyes: Conjunctivae normal and EOM are normal.  Neck: Normal range of motion. Neck supple. No JVD present.       No carotid bruits  Cardiovascular: Normal rate, regular rhythm and intact distal pulses.   No murmur heard. Pulmonary/Chest: Effort normal and breath sounds normal. No respiratory distress.  Abdominal: Soft. She exhibits no distension. There is  no tenderness.  Musculoskeletal: Normal range of motion.  Neurological: She is alert and oriented to person, place, and time. No cranial nerve deficit.       Tremulous  Skin: Skin is warm and dry.  Psychiatric: Her behavior is normal. Judgment and thought content normal.       Extremely anxious    ED Course  Procedures (including critical care time) 71 year old, female, with a history of anxiety, presents emergency department with vomiting, and diarrhea for  the past week.  Her symptoms have caused her to have a panic attack, which she describes as chest pain, and shortness of breath.  We will treat her with IV fluids, and antiemetics, but also an anxiolytic.  I will perform cardiac enzymes, once to evaluate her heart Expect them to be negative.  Her pain has been present since yesterday, so, one set of cardiac enzymes.  Should be sufficient, to evaluate for cardiac ischemia.  Labs Reviewed  GLUCOSE, CAPILLARY - Abnormal; Notable for the following:    Glucose-Capillary 329 (*)     All other components within normal limits  CBC  BASIC METABOLIC PANEL  PRO B NATRIURETIC PEPTIDE  TROPONIN I  URINALYSIS, ROUTINE W REFLEX MICROSCOPIC   No results found.   No diagnosis found.  ECG Normal sinus rhythm at 93 beats per minute. First degree AV block. Poor R wave progression. Normal.  ST and T-wave  6:53 PM sxs resolved  MDM  Gastroenteritis Anxiety attack        Barbara Cower, MD 10/25/11 [place], MD 10/25/11 [id number]

## 2011-10-25 NOTE — ED Notes (Signed)
Pt given sips of ice water for PO challenge.

## 2011-10-25 NOTE — ED Notes (Signed)
Pt states she has been having N/V/D x1 week. Reports today began having chest pain described as burning to center of chest with sob and diaphoresis at night. States she is having "adrenonline" going through her arms and states she feels like she is having an anxiety attack.

## 2011-10-25 NOTE — ED Notes (Signed)
MD at bedside. Dr. Vanessa Kick at bedside.

## 2011-11-02 ENCOUNTER — Encounter (HOSPITAL_COMMUNITY)
Admission: RE | Admit: 2011-11-02 | Discharge: 2011-11-02 | Disposition: A | Discharge status: Home / Self Care | Payer: Medicare HMO | Source: Ambulatory Visit | Attending: Gastroenterology | Admitting: Gastroenterology

## 2011-11-02 ENCOUNTER — Encounter (HOSPITAL_COMMUNITY): Payer: Self-pay

## 2011-11-02 DIAGNOSIS — R112 Nausea with vomiting, unspecified: Secondary | ICD-10-CM | POA: Insufficient documentation

## 2011-11-02 DIAGNOSIS — E860 Dehydration: Secondary | ICD-10-CM | POA: Insufficient documentation

## 2011-11-02 HISTORY — DX: Major depressive disorder, single episode, unspecified: F32.9

## 2011-11-02 HISTORY — DX: Personal history of other diseases of the digestive system: Z87.19

## 2011-11-02 HISTORY — DX: Sleep apnea, unspecified: G47.30

## 2011-11-02 HISTORY — DX: Depression, unspecified: F32.A

## 2011-11-02 HISTORY — DX: Anemia, unspecified: D64.9

## 2011-11-02 HISTORY — DX: Unspecified osteoarthritis, unspecified site: M19.90

## 2011-11-02 HISTORY — DX: Hypothyroidism, unspecified: E03.9

## 2011-11-02 HISTORY — DX: Anxiety disorder, unspecified: F41.9

## 2011-11-02 MED ORDER — SODIUM CHLORIDE 0.9 % IV SOLN
Freq: Once | INTRAVENOUS | Status: AC
  Administered 2011-11-02: 09:00:00 via INTRAVENOUS

## 2011-11-02 NOTE — Progress Notes (Signed)
Pt states she has had nausea and vomiting and running a low grade fever for the past 3 weeks. She feels slightly nauseated this am.

## 2011-11-11 ENCOUNTER — Other Ambulatory Visit (HOSPITAL_COMMUNITY): Payer: Self-pay | Admitting: Orthopedic Surgery

## 2011-11-11 ENCOUNTER — Ambulatory Visit (HOSPITAL_COMMUNITY)
Admission: RE | Admit: 2011-11-11 | Discharge: 2011-11-11 | Disposition: A | Discharge status: Home / Self Care | Payer: Medicare HMO | Source: Ambulatory Visit | Attending: Orthopedic Surgery | Admitting: Orthopedic Surgery

## 2011-11-11 ENCOUNTER — Encounter (HOSPITAL_COMMUNITY)
Admission: RE | Admit: 2011-11-11 | Discharge: 2011-11-11 | Disposition: A | Discharge status: Home / Self Care | Payer: Medicare HMO | Source: Ambulatory Visit | Attending: Orthopedic Surgery | Admitting: Orthopedic Surgery

## 2011-11-11 DIAGNOSIS — I2699 Other pulmonary embolism without acute cor pulmonale: Secondary | ICD-10-CM

## 2011-11-11 DIAGNOSIS — R0989 Other specified symptoms and signs involving the circulatory and respiratory systems: Secondary | ICD-10-CM | POA: Insufficient documentation

## 2011-11-11 DIAGNOSIS — I517 Cardiomegaly: Secondary | ICD-10-CM | POA: Insufficient documentation

## 2011-11-11 DIAGNOSIS — R059 Cough, unspecified: Secondary | ICD-10-CM | POA: Insufficient documentation

## 2011-11-11 DIAGNOSIS — R05 Cough: Secondary | ICD-10-CM | POA: Insufficient documentation

## 2011-11-11 MED ORDER — TECHNETIUM TO 99M ALBUMIN AGGREGATED: 3.0000 | Freq: Once | INTRAVENOUS | Status: AC | PRN

## 2011-11-16 ENCOUNTER — Encounter (HOSPITAL_COMMUNITY): Payer: Self-pay | Admitting: Pharmacy Technician

## 2011-11-17 ENCOUNTER — Encounter (HOSPITAL_COMMUNITY): Payer: Self-pay

## 2011-11-17 ENCOUNTER — Encounter (HOSPITAL_COMMUNITY)
Admit: 2011-11-17 | Discharge: 2011-11-17 | Disposition: A | Discharge status: Home / Self Care | Payer: Medicare HMO | Attending: Orthopedic Surgery | Admitting: Orthopedic Surgery

## 2011-11-17 ENCOUNTER — Ambulatory Visit (HOSPITAL_COMMUNITY)
Admission: RE | Admit: 2011-11-17 | Discharge: 2011-11-22 | Disposition: A | Discharge status: Home / Self Care | Payer: Medicare HMO | Source: Ambulatory Visit | Attending: Orthopedic Surgery | Admitting: Orthopedic Surgery

## 2011-11-17 DIAGNOSIS — Z01810 Encounter for preprocedural cardiovascular examination: Secondary | ICD-10-CM | POA: Insufficient documentation

## 2011-11-17 DIAGNOSIS — Z01812 Encounter for preprocedural laboratory examination: Secondary | ICD-10-CM | POA: Insufficient documentation

## 2011-11-17 DIAGNOSIS — I10 Essential (primary) hypertension: Secondary | ICD-10-CM | POA: Insufficient documentation

## 2011-11-17 DIAGNOSIS — E039 Hypothyroidism, unspecified: Secondary | ICD-10-CM | POA: Insufficient documentation

## 2011-11-17 DIAGNOSIS — E119 Type 2 diabetes mellitus without complications: Secondary | ICD-10-CM | POA: Insufficient documentation

## 2011-11-17 DIAGNOSIS — I2510 Atherosclerotic heart disease of native coronary artery without angina pectoris: Secondary | ICD-10-CM | POA: Insufficient documentation

## 2011-11-17 DIAGNOSIS — Z8673 Personal history of transient ischemic attack (TIA), and cerebral infarction without residual deficits: Secondary | ICD-10-CM | POA: Insufficient documentation

## 2011-11-17 DIAGNOSIS — J449 Chronic obstructive pulmonary disease, unspecified: Secondary | ICD-10-CM | POA: Insufficient documentation

## 2011-11-17 DIAGNOSIS — M65319 Trigger thumb, unspecified thumb: Secondary | ICD-10-CM

## 2011-11-17 DIAGNOSIS — M6530 Trigger finger, unspecified finger: Secondary | ICD-10-CM | POA: Insufficient documentation

## 2011-11-17 DIAGNOSIS — Z794 Long term (current) use of insulin: Secondary | ICD-10-CM | POA: Insufficient documentation

## 2011-11-17 DIAGNOSIS — J4489 Other specified chronic obstructive pulmonary disease: Secondary | ICD-10-CM | POA: Insufficient documentation

## 2011-11-17 HISTORY — DX: Adverse effect of unspecified anesthetic, initial encounter: T41.45XA

## 2011-11-17 HISTORY — DX: Other complications of anesthesia, initial encounter: T88.59XA

## 2011-11-17 LAB — BASIC METABOLIC PANEL
BUN: 13 mg/dL (ref 6–23)
CO2: 30 mEq/L (ref 19–32)
Calcium: 9.4 mg/dL (ref 8.4–10.5)
Chloride: 101 mEq/L (ref 96–112)
Creatinine, Ser: 0.85 mg/dL (ref 0.50–1.10)
GFR calc Af Amer: 77 mL/min — ABNORMAL LOW (ref >90)
GFR calc non Af Amer: 66 mL/min — ABNORMAL LOW (ref >90)
Glucose, Bld: 317 mg/dL — ABNORMAL HIGH (ref 70–99)
Potassium: 3.5 mEq/L (ref 3.5–5.1)
Sodium: 142 mEq/L (ref 135–145)

## 2011-11-17 LAB — CBC
HCT: 35 % — ABNORMAL LOW (ref 36.0–46.0)
Hemoglobin: 11.8 g/dL — ABNORMAL LOW (ref 12.0–15.0)
MCH: 31.7 pg (ref 26.0–34.0)
MCHC: 33.7 g/dL (ref 30.0–36.0)
MCV: 94.1 fL (ref 78.0–100.0)
Platelets: 237 10*3/uL (ref 150–400)
RBC: 3.72 MIL/uL — ABNORMAL LOW (ref 3.87–5.11)
RDW: 13 % (ref 11.5–15.5)
WBC: 6.5 10*3/uL (ref 4.0–10.5)

## 2011-11-17 LAB — SURGICAL PCR SCREEN
MRSA, PCR: NEGATIVE
Staphylococcus aureus: NEGATIVE

## 2011-11-17 MED ORDER — CHLORHEXIDINE GLUCONATE 4 % EX LIQD: 60.0000 mL | Freq: Once | CUTANEOUS | Status: DC

## 2011-11-17 NOTE — Pre-Procedure Instructions (Signed)
[place]  11/17/2011   Your procedure is scheduled on:  Tuesday November 22, 2011 at 1320 PM  Report to [facility] at 1120 AM.  Call this number if you have problems the morning of surgery: [phone]   Remember:   Do not eat food or drink:After Midnight.Monday      Take these medicines the morning of surgery with A SIP OF WATER: Nexium, Lorazepam, Levothyroxine, and Levbid. Stopped Plavix on 11/16/2011. No insulin or diabetic meds morning of surgery.   Do not wear jewelry, make-up or nail polish.  Do not wear lotions, powders, or perfumes. You may wear deodorant.  Do not shave 48 hours prior to surgery.   Do not bring valuables to the hospital.  Contacts, dentures or bridgework may not be worn into surgery.  Leave suitcase in the car. After surgery it may be brought to your room.  For patients admitted to the hospital, checkout time is 11:00 AM the day of discharge.   Patients discharged the day of surgery will not be allowed to drive home.    Special Instructions: Shower using CHG 2 nights before surgery and the night before surgery.  If you shower the day of surgery use CHG.  Use special wash - you have one bottle of CHG for all showers.  You should use approximately 1/3 of the bottle for each shower.   Please read over the following fact sheets that you were given: Pain Booklet, Coughing and Deep Breathing, MRSA Information and Surgical Site Infection Prevention

## 2011-11-17 NOTE — Progress Notes (Signed)
Spoke with Ebony Hail PA informed of pt hx and EKG. Instructed to repeat EKG and she will review chart Requested from [facility] last OV, Stress test and ECHO .

## 2011-11-18 ENCOUNTER — Encounter (HOSPITAL_COMMUNITY): Payer: Self-pay | Admitting: Vascular Surgery

## 2011-11-18 NOTE — Consult Note (Addendum)
Anesthesia chart review: Patient is a 71 year old female scheduled for left trigger thumb release by Dr. Marlou Sa on 11/22/2011. 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  PCP is Dr. Shon Baton, will request records (noted).  She has been evaluated by Dr. Ellyn Hack at [facility], but not since 01/22/10 following "relatively normal" stress and echo (see below).  Nuclear stress test on 01/12/10 [facility]) showed normal myocardial perfusion, EF 77%. No significant wall motion abnormalities noted. This is a low risk scan.  Echo on 01/11/10 [facility]) showed mild concentric LVH, left ventricular cavity is small, EF greater than [id number], LV systolic function is normal, Doppler findings suggestive of impaired LV relaxation, LA size is normal, aortic root sclerosis/calcification, no significant valvular disease noted.  EKG on 11/17/11 showed NSR, minimal voltage criteria for LVH, cannot rule out anterior infarct (age undetermined).  Borderline first degree AVB.  Her r waves are lower in leads III and aVF, but overall I think her EKG is stable when compared to 10/25/11 ([facility]) and 12/30/09 ([facility]).  CXR report on 11/11/11 showed: Better lung volumes. Stable mild cardiomegaly. Other  mediastinal contours are within normal limits. Visualized tracheal air column is within normal limits. Stable mild increased interstitial markings diffusely. No pneumothorax, pulmonary edema, pleural effusion or confluent pulmonary opacity. No acute osseous abnormality identified.  Labs noted.  Non-fasting glucose is 317.  H/H 11.8/35.0.   She will get a CBG on arrival. (Last two Hgb A1C, 7.7 and 8.7 per 10/23/11 office note).   She  had relatively normal stress and echo as above within the past two years.  No CV symptoms were documented at her PAT appointment.  If no new symptoms and blood glucose is reasonable then anticipate she can proceed as planned.  Myra Gianotti, PA-C

## 2011-11-21 MED ORDER — CHLORHEXIDINE GLUCONATE 4 % EX LIQD: 60.0000 mL | Freq: Once | CUTANEOUS | Status: DC

## 2011-11-21 MED ORDER — DEXTROSE 5 % IV SOLN
3.0000 g | INTRAVENOUS | Status: DC
  Filled 2011-11-21: qty 3000

## 2011-11-21 NOTE — Progress Notes (Signed)
Pt notified of time change to arrive @ 1315 on 11/22/11

## 2011-11-22 ENCOUNTER — Encounter (HOSPITAL_COMMUNITY): Payer: Self-pay | Admitting: Anesthesiology

## 2011-11-22 ENCOUNTER — Encounter (HOSPITAL_COMMUNITY): Payer: Self-pay | Admitting: *Deleted

## 2011-11-22 ENCOUNTER — Encounter (HOSPITAL_COMMUNITY): Payer: Self-pay | Admitting: Vascular Surgery

## 2011-11-22 ENCOUNTER — Encounter (HOSPITAL_COMMUNITY)
Admission: RE | Disposition: A | Discharge status: Home / Self Care | Payer: Self-pay | Source: Ambulatory Visit | Attending: Orthopedic Surgery

## 2011-11-22 ENCOUNTER — Ambulatory Visit (HOSPITAL_COMMUNITY): Payer: Medicare HMO | Admitting: Vascular Surgery

## 2011-11-22 HISTORY — PX: TRIGGER FINGER RELEASE: SHX641

## 2011-11-22 LAB — GLUCOSE, CAPILLARY
Glucose-Capillary: 296 mg/dL — ABNORMAL HIGH (ref 70–99)
Glucose-Capillary: 259 mg/dL — ABNORMAL HIGH (ref 70–99)
Glucose-Capillary: 234 mg/dL — ABNORMAL HIGH (ref 70–99)

## 2011-11-22 SURGERY — RELEASE, A1 PULLEY, FOR TRIGGER FINGER
Anesthesia: Monitor Anesthesia Care | Site: Thumb | Laterality: Left | Wound class: Clean

## 2011-11-22 MED ORDER — LACTATED RINGERS IV SOLN
INTRAVENOUS | Status: DC
  Administered 2011-11-22: 15:00:00 via INTRAVENOUS

## 2011-11-22 MED ORDER — OXYCODONE HCL 5 MG PO TABS: 5.0000 mg | ORAL_TABLET | Freq: Once | ORAL | Status: DC | PRN

## 2011-11-22 MED ORDER — HYDROMORPHONE HCL PF 1 MG/ML IJ SOLN: 0.2500 mg | INTRAMUSCULAR | Status: DC | PRN

## 2011-11-22 MED ORDER — LIDOCAINE HCL (PF) 0.5 % IJ SOLN
50.0000 mL | INTRAMUSCULAR | Status: DC
  Filled 2011-11-22: qty 50

## 2011-11-22 MED ORDER — BUPIVACAINE HCL (PF) 0.25 % IJ SOLN
INTRAMUSCULAR | Status: AC
  Filled 2011-11-22: qty 30

## 2011-11-22 MED ORDER — OXYCODONE HCL 5 MG/5ML PO SOLN: 5.0000 mg | Freq: Once | ORAL | Status: DC | PRN

## 2011-11-22 MED ORDER — ONDANSETRON HCL 4 MG/2ML IJ SOLN
INTRAMUSCULAR | Status: DC | PRN
  Administered 2011-11-22: 4 mg via INTRAVENOUS

## 2011-11-22 MED ORDER — MEPERIDINE HCL 25 MG/ML IJ SOLN: 6.2500 mg | INTRAMUSCULAR | Status: DC | PRN

## 2011-11-22 MED ORDER — TRAMADOL HCL 50 MG PO TABS: 50.0000 mg | ORAL_TABLET | Freq: Four times a day (QID) | ORAL | Status: DC | PRN

## 2011-11-22 MED ORDER — LACTATED RINGERS IV SOLN
INTRAVENOUS | Status: DC | PRN
  Administered 2011-11-22: 16:00:00 via INTRAVENOUS

## 2011-11-22 MED ORDER — MIDAZOLAM HCL 5 MG/5ML IJ SOLN
INTRAMUSCULAR | Status: DC | PRN
  Administered 2011-11-22 (×2): 1 mg via INTRAVENOUS

## 2011-11-22 MED ORDER — FENTANYL CITRATE 0.05 MG/ML IJ SOLN
INTRAMUSCULAR | Status: DC | PRN
  Administered 2011-11-22: 25 ug via INTRAVENOUS
  Administered 2011-11-22: 50 ug via INTRAVENOUS
  Administered 2011-11-22: 25 ug via INTRAVENOUS

## 2011-11-22 MED ORDER — PROMETHAZINE HCL 25 MG/ML IJ SOLN: 6.2500 mg | INTRAMUSCULAR | Status: DC | PRN

## 2011-11-22 MED ORDER — MIDAZOLAM HCL 2 MG/2ML IJ SOLN: 0.5000 mg | Freq: Once | INTRAMUSCULAR | Status: DC | PRN

## 2011-11-22 MED ORDER — BUPIVACAINE HCL (PF) 0.25 % IJ SOLN
INTRAMUSCULAR | Status: DC | PRN
  Administered 2011-11-22: 10 mL

## 2011-11-22 MED ORDER — PROPOFOL INFUSION 10 MG/ML OPTIME
INTRAVENOUS | Status: DC | PRN
  Administered 2011-11-22: 75 ug/kg/min via INTRAVENOUS

## 2011-11-22 SURGICAL SUPPLY — 34 items
BANDAGE CONFORM 2  STR LF (GAUZE/BANDAGES/DRESSINGS) ×1 IMPLANT
BANDAGE GAUZE ELAST BULKY 4 IN (GAUZE/BANDAGES/DRESSINGS) ×2 IMPLANT
BNDG CMPR MD 5X2 ELC HKLP STRL (GAUZE/BANDAGES/DRESSINGS) ×1
BNDG ELASTIC 2 VLCR STRL LF (GAUZE/BANDAGES/DRESSINGS) ×1 IMPLANT
CANISTER SUCTION 2500CC (MISCELLANEOUS) ×1 IMPLANT
CLOTH BEACON ORANGE TIMEOUT ST (SAFETY) ×2 IMPLANT
CORDS BIPOLAR (ELECTRODE) ×2 IMPLANT
COVER SURGICAL LIGHT HANDLE (MISCELLANEOUS) ×2 IMPLANT
CUFF TOURNIQUET SINGLE 18IN (TOURNIQUET CUFF) ×2 IMPLANT
CUFF TOURNIQUET SINGLE 24IN (TOURNIQUET CUFF) IMPLANT
DRAPE SURG 17X23 STRL (DRAPES) ×2 IMPLANT
DURAPREP 26ML APPLICATOR (WOUND CARE) ×2 IMPLANT
GAUZE XEROFORM 1X8 LF (GAUZE/BANDAGES/DRESSINGS) ×2 IMPLANT
GLOVE BIOGEL PI IND STRL 8 (GLOVE) ×1 IMPLANT
GLOVE BIOGEL PI INDICATOR 8 (GLOVE) ×1
GLOVE SURG ORTHO 8.0 STRL STRW (GLOVE) ×3 IMPLANT
GOWN PREVENTION PLUS XLARGE (GOWN DISPOSABLE) ×2 IMPLANT
GOWN STRL NON-REIN LRG LVL3 (GOWN DISPOSABLE) ×3 IMPLANT
KIT BASIN OR (CUSTOM PROCEDURE TRAY) ×2 IMPLANT
KIT ROOM TURNOVER OR (KITS) ×2 IMPLANT
NDL HYPO 25GX1X1/2 BEV (NEEDLE) IMPLANT
NEEDLE HYPO 25GX1X1/2 BEV (NEEDLE) ×2 IMPLANT
NS IRRIG 1000ML POUR BTL (IV SOLUTION) ×2 IMPLANT
PACK ORTHO EXTREMITY (CUSTOM PROCEDURE TRAY) ×2 IMPLANT
PAD ARMBOARD 7.5X6 YLW CONV (MISCELLANEOUS) ×4 IMPLANT
SPONGE GAUZE 4X4 12PLY (GAUZE/BANDAGES/DRESSINGS) ×2 IMPLANT
SUCTION FRAZIER TIP 10 FR DISP (SUCTIONS) IMPLANT
SUT ETHILON 3 0 PS 1 (SUTURE) ×2 IMPLANT
SYR CONTROL 10ML LL (SYRINGE) ×1 IMPLANT
TOWEL OR 17X24 6PK STRL BLUE (TOWEL DISPOSABLE) ×2 IMPLANT
TOWEL OR 17X26 10 PK STRL BLUE (TOWEL DISPOSABLE) ×2 IMPLANT
TUBE CONNECTING 12X1/4 (SUCTIONS) ×1 IMPLANT
UNDERPAD 30X30 INCONTINENT (UNDERPADS AND DIAPERS) ×2 IMPLANT
YANKAUER SUCT BULB TIP NO VENT (SUCTIONS) ×1 IMPLANT

## 2011-11-22 NOTE — Anesthesia Preprocedure Evaluation (Signed)
Anesthesia Evaluation  Patient identified by MRN, date of birth, ID band Patient awake    Reviewed: Allergy & Precautions, H&P , NPO status , Patient's Chart, lab work & pertinent test results  History of Anesthesia Complications Negative for: history of anesthetic complications  Airway Mallampati: I TM Distance: >3 FB Neck ROM: Full    Dental  (+) Edentulous Upper and Edentulous Lower   Pulmonary shortness of breath (has not needed home O2 in a year) and Long-Term Oxygen Therapy, asthma , sleep apnea (mild OSA, no CPAP) , COPD COPD inhaler,  H/o PE x 3 breath sounds clear to auscultation  Pulmonary exam normal       Cardiovascular hypertension, Pt. on medications + CAD and + Peripheral Vascular Disease Rhythm:Regular Rate:Normal  Stress test '11: no ischemia, normal perfusion, low risk study   Neuro/Psych PSYCHIATRIC DISORDERS Anxiety Depression TIACVA, No Residual Symptoms    GI/Hepatic GERD-  Medicated and Controlled,  Endo/Other  diabetes (glu 296), Well Controlled, Type 2, Insulin Dependent and Oral Hypoglycemic AgentsHypothyroidism (on replacement) Morbid obesity  Renal/GU      Musculoskeletal   Abdominal (+) + obese,   Peds  Hematology   Anesthesia Other Findings   Reproductive/Obstetrics                           Anesthesia Physical Anesthesia Plan  ASA: III  Anesthesia Plan: Bier Block and MAC   Post-op Pain Management:    Induction:   Airway Management Planned: Simple Face Mask  Additional Equipment:   Intra-op Plan:   Post-operative Plan:   Informed Consent: I have reviewed the patients History and Physical, chart, labs and discussed the procedure including the risks, benefits and alternatives for the proposed anesthesia with the patient or authorized representative who has indicated his/her understanding and acceptance.     Plan Discussed with: CRNA and  Surgeon  Anesthesia Plan Comments: (Plan routine monitors, Bier block with MAC)        Anesthesia Quick Evaluation

## 2011-11-22 NOTE — Preoperative (Signed)
Beta Blockers   Reason not to administer Beta Blockers:Not Applicable 

## 2011-11-22 NOTE — Progress Notes (Signed)
Patient arrived in OR holding area with no iv intact.

## 2011-11-22 NOTE — Anesthesia Procedure Notes (Signed)
Anesthesia Regional Block:  Bier block (IV Regional)  Pre-Anesthetic Checklist: ,, timeout performed, Correct Patient, Correct Site, Correct Laterality, Correct Procedure, Correct Position, site marked, Risks and benefits discussed, Surgical consent,  Pre-op evaluation,  At surgeon's request  Laterality: Left  Prep: chloraprep       Needles:  Injection technique: Single-shot      Additional Needles: Bier block (IV Regional) Narrative:   Performed by: Other  Anesthesiologist: Dr. Glennon Mac  Additional Notes: 22g IV to left hand. Single forearm tourniquet to left forearm: Tourniquet check successful. Left arm exsanguinated. No radial pulse. Tourniquet up. 0.5% lidocaine IV 40cc injected. IV site intact. Pt tolerated well. No CNS complications noted.

## 2011-11-22 NOTE — H&P (Signed)
Tara Jackson is an 71 y.o. female.   Chief Complaint: Left trigger thumb HPI: Tara Jackson is a 71 year old female with several weeks to month history of left trigger thumb. She's had successful release of right trigger thumb. She reports triggering with every flexion of the left thumb. She is right-hand dominant who does a lot of activities with her left hand. Walks every night. Painful it the volar flexion crease of the thumb. She denies any numbness and tingling but does report swelling in the left thumb. Again she has had successful release of the right trigger found after failed conservative management. She desires trigger thumb release do to the consistent nature of the triggering and pain in her good result with the right trigger thumb.  Past Medical History  Diagnosis Date  . Diabetes mellitus   . DVT (deep venous thrombosis)   . Asthma   . Pulmonary embolus   . Hypertension   . Hypothyroidism   . Anxiety   . Depression   . Sleep apnea     cpap disontinued  . Stroke     tia and stroke. Weakness Rt hand  . H/O hiatal hernia   . Arthritis   . Anemia   . Complication of anesthesia     hard to wake up after anesthesia  . Coronary artery disease     enlarged heart, mild LVH, EF 55% by echo; no ischemia by stress 01/2010 echo [facility])    Past Surgical History  Procedure Date  . Abdominal hysterectomy   . Cholecystectomy   . Back surgery     steroid inj  . Tonsillectomy   . Appendectomy   . Esophagogastroduodenoscopy 08/09/2011    Procedure: ESOPHAGOGASTRODUODENOSCOPY (EGD);  Surgeon: Jeryl Columbia, MD;  Location: [facility];  Service: Endoscopy;  Laterality: N/A;  . Hot hemostasis 08/09/2011    Procedure: HOT HEMOSTASIS (ARGON PLASMA COAGULATION/BICAP);  Surgeon: Jeryl Columbia, MD;  Location: [facility];  Service: Endoscopy;  Laterality: N/A;  . Joint replacement     both knees    History reviewed. No pertinent family history. Social History:  reports that she has never  smoked. She has never used smokeless tobacco. She reports that she does not drink alcohol or use illicit drugs.  Allergies:  Allergies  Allergen Reactions  . Iodine Other (See Comments)    Makes unconscious  . Metoclopramide Hcl Other (See Comments)    suicidal  . Sulfa Antibiotics Hives  . Iohexol      Code: SOB, Desc: cardiac arrest w/ iv contrast, has never used 13 hr prep//alice calhoun, Onset Date: [id number]   . Lactose Intolerance (Gi)   . Lansoprazole Nausea And Vomiting    Medications Prior to Admission  Medication Sig Dispense Refill  . capsaicin (ZOSTRIX) 0.025 % cream Apply 1 application topically 2 (two) times daily as needed. applies to feet for burning      . clonazePAM (KLONOPIN) 0.5 MG tablet Take 1 mg by mouth 2 (two) times daily.       . clopidogrel (PLAVIX) 75 MG tablet Take 75 mg by mouth daily.      . cyclobenzaprine (FLEXERIL) 10 MG tablet Take 10 mg by mouth 2 (two) times daily.      Marland Kitchen docusate sodium (COLACE) 100 MG capsule Take 100 mg by mouth daily.      Marland Kitchen esomeprazole (NEXIUM) 40 MG capsule Take 40 mg by mouth daily before breakfast.      . furosemide (LASIX) 20 MG  tablet Take 20 mg by mouth daily as needed. For increased fluid      . glipiZIDE (GLUCOTROL) 10 MG tablet Take 20 mg by mouth 2 (two) times daily before a meal.      . hydrochlorothiazide (MICROZIDE) 12.5 MG capsule Take 12.5 mg by mouth daily as needed. For fluid retention      . hyoscyamine (LEVBID) 0.375 MG 12 hr tablet Take 0.375 mg by mouth 2 (two) times daily.      . insulin NPH (HUMULIN N,NOVOLIN N) 100 UNIT/ML injection Inject 10-20 Units into the skin 2 (two) times daily before a meal. 20 units qam 10 units qpm      . insulin regular (NOVOLIN R,HUMULIN R) 100 units/mL injection Inject 20-30 Units into the skin 3 (three) times daily before meals. 30 units am 20 units at lunch and 20 units pm      . levothyroxine (SYNTHROID, LEVOTHROID) 100 MCG tablet Take 100 mcg by mouth daily.      Marland Kitchen  lidocaine (LIDODERM) 5 % Place 1 patch onto the skin daily.      Marland Kitchen LORazepam (ATIVAN) 1 MG tablet Take 1 tablet (1 mg total) by mouth 3 (three) times daily as needed for anxiety.  15 tablet  0  . losartan (COZAAR) 50 MG tablet Take 50 mg by mouth daily.      . nortriptyline (PAMELOR) 25 MG capsule Take 25 mg by mouth 2 (two) times daily. Takes along with clonazepam      . polyethylene glycol (MIRALAX / GLYCOLAX) packet Take 17 g by mouth daily as needed. constipation      . potassium chloride SA (K-DUR,KLOR-CON) 20 MEQ tablet Take 20 mEq by mouth daily.      . Probiotic Product (PROBIOTIC PO) Take 1 capsule by mouth daily.      . rosuvastatin (CRESTOR) 20 MG tablet Take 20 mg by mouth daily.      . sucralfate (CARAFATE) 1 G tablet Take 1 g by mouth 2 (two) times daily.      Marland Kitchen terconazole (TERAZOL 7) 0.4 % vaginal cream Place 1 applicator vaginally daily as needed. Yeast infection      . valsartan (DIOVAN) 160 MG tablet Take 160 mg by mouth daily.        Results for orders placed during the hospital encounter of 11/22/11 (from the past 48 hour(s))  GLUCOSE, CAPILLARY     Status: Abnormal   Collection Time   11/22/11  1:31 PM      Component Value Range Comment   Glucose-Capillary 296 (*) 70 - 99 mg/dL   GLUCOSE, CAPILLARY     Status: Abnormal   Collection Time   11/22/11  3:09 PM      Component Value Range Comment   Glucose-Capillary 259 (*) 70 - 99 mg/dL    No results found.  Review of Systems  Constitutional: Negative.   HENT: Negative.   Eyes: Negative.   Respiratory: Negative.   Cardiovascular: Negative.   Gastrointestinal: Negative.   Genitourinary: Negative.   Musculoskeletal: Positive for joint pain.  Skin: Negative.   Neurological: Negative.   Endo/Heme/Allergies: Negative.   Psychiatric/Behavioral: Negative.     Blood pressure 135/59, pulse 75, temperature 98.2 F (36.8 C), temperature source Oral, resp. rate 18, SpO2 97.00%. Physical Exam  Constitutional: She  appears well-developed.  HENT:  Head: Normocephalic.  Eyes: Pupils are equal, round, and reactive to light.  Neck: Normal range of motion.  Cardiovascular: Intact distal pulses.  Respiratory: Effort normal.  GI: Soft.   left thumb demonstrates tenderness of the A1 pulley triggering with every flexion moment EPL FPL intact thumb is perfused and sensate radial pulses intact  Assessment/Plan Impression is left trigger thumb with triggering with every flexion attempts. This is going on for weeks to months. She desires surgical correction based on her good result in the right thumb. In the office we did discuss operative and nonoperative treatment. Nonoperative treatment would include injection that she does not want pursue that. Giving her night pain and rest pain and pain with her activities of daily living. She desires surgical correction risk and benefits including but not limited to infection nerve vessel damage discussed with the patient all questions answered patient does have multiple medical comorbidities which increase her risk planned surgical release with immediate postop range of motion all questions answered  Shynice Sigel SCOTT 11/22/2011, 3:35 PM

## 2011-11-22 NOTE — Transfer of Care (Signed)
Immediate Anesthesia Transfer of Care Note  Patient: Tara Jackson  Procedure(s) Performed: Procedure(s) (LRB) with comments: RELEASE TRIGGER FINGER/A-1 PULLEY (Left) - Left trigger thumb release  Patient Location: PACU  Anesthesia Type: MAC and Regional  Level of Consciousness: awake, alert , oriented and patient cooperative  Airway & Oxygen Therapy: Patient Spontanous Breathing and Patient connected to nasal cannula oxygen  Post-op Assessment: Report given to PACU RN and Post -op Vital signs reviewed and stable  Post vital signs: Reviewed and stable  Complications: No apparent anesthesia complications

## 2011-11-22 NOTE — Anesthesia Postprocedure Evaluation (Signed)
  Anesthesia Post-op Note  Patient: Tara Jackson  Procedure(s) Performed: Procedure(s) (LRB) with comments: RELEASE TRIGGER FINGER/A-1 PULLEY (Left) - Left trigger thumb release  Patient Location: PACU  Anesthesia Type: MAC and Bier block  Level of Consciousness: awake and alert   Airway and Oxygen Therapy: Patient Spontanous Breathing  Post-op Pain: mild  Post-op Assessment: Post-op Vital signs reviewed, Patient's Cardiovascular Status Stable, Respiratory Function Stable, Patent Airway, No signs of Nausea or vomiting and Pain level controlled  Post-op Vital Signs: stable  Complications: No apparent anesthesia complications

## 2011-11-22 NOTE — Brief Op Note (Signed)
11/22/2011  5:06 PM  PATIENT:  Tara Jackson  71 y.o. female  PRE-OPERATIVE DIAGNOSIS:  Left trigger thumb   POST-OPERATIVE DIAGNOSIS:  Left trigger thumb   PROCEDURE:  Procedure(s): RELEASE TRIGGER FINGER/A-1 PULLEY  SURGEON:  Surgeon(s): Meredith Pel, MD  ASSISTANT:   ANESTHESIA:   regional  EBL: 2 ml    Total I/O In: 500 [I.V.:500] Out: -   BLOOD ADMINISTERED: none  DRAINS: none   LOCAL MEDICATIONS USED:  none  SPECIMEN:  No Specimen  COUNTS:  YES  TOURNIQUET:   Total Tourniquet Time Documented: Forearm (Left) - 23 minutes  DICTATION: .Other Dictation: Dictation Number [id number]  PLAN OF CARE: Discharge to home after PACU  PATIENT DISPOSITION:  PACU - hemodynamically stable

## 2011-11-23 ENCOUNTER — Encounter (HOSPITAL_COMMUNITY): Payer: Self-pay | Admitting: Orthopedic Surgery

## 2011-11-23 NOTE — Op Note (Signed)
Tara Jackson, Tara Jackson NO.:  [id number]  MEDICAL RECORD NO.:  [id number]  LOCATION:  [facility]                         FACILITY:  [facility]  PHYSICIAN:  Anderson Malta, M.D.    DATE OF BIRTH:  [birth year]  DATE OF PROCEDURE:  11/22/2011 DATE OF DISCHARGE:  11/22/2011                              OPERATIVE REPORT   PREOPERATIVE DIAGNOSIS:  Left trigger thumb.  POSTOPERATIVE DIAGNOSIS:  Left trigger thumb.  PROCEDURE:  Left trigger thumb release.  SURGEON:  Anderson Malta, M.D.  ASSISTANT:  None.  ANESTHESIA:  IV regional.  INDICATIONS:  [name] is a 71 year old patient with refractory left trigger thumb daily hourly locking with pain, interferes with ADL, sleep, and rest.  She presents for operative management after explanation of risks and benefits.  PROCEDURE IN DETAIL:  The patient was brought to operating room, where IV regional anesthetic was induced.  Time-out was called.  Left arm was prescrubbed with alcohol and Betadine, which was allowed to air dry. Prepped with DuraPrep solution, draped in sterile manner.  At the proximal thumb flexion crease, incision was made transversely, neurovascular bundle was identified.  Dissection carried down to the A1 pulley, which was visualized.  Under direct visualization, A1 pulley was released and found to have no locking.  At this time, incision was anesthetized with Marcaine.  The skin edges were then closed using 3-0 nylon after the tourniquet was released and bleeding points controlled using bipolar electrocautery.  Bulky dressing was applied.  The patient tolerated the procedure well without immediate complications.     Anderson Malta, M.D.     GSD/MEDQ  D:  11/22/2011  T:  11/23/2011  Job:  [id number]

## 2011-12-12 ENCOUNTER — Other Ambulatory Visit: Payer: Self-pay | Admitting: Internal Medicine

## 2011-12-12 DIAGNOSIS — Z1231 Encounter for screening mammogram for malignant neoplasm of breast: Secondary | ICD-10-CM

## 2011-12-28 ENCOUNTER — Other Ambulatory Visit (HOSPITAL_COMMUNITY): Payer: Self-pay | Admitting: Cardiology

## 2011-12-28 DIAGNOSIS — Z8739 Personal history of other diseases of the musculoskeletal system and connective tissue: Secondary | ICD-10-CM

## 2011-12-28 DIAGNOSIS — L98499 Non-pressure chronic ulcer of skin of other sites with unspecified severity: Secondary | ICD-10-CM

## 2011-12-28 DIAGNOSIS — R609 Edema, unspecified: Secondary | ICD-10-CM

## 2011-12-28 DIAGNOSIS — M790 Rheumatism, unspecified: Secondary | ICD-10-CM

## 2011-12-28 DIAGNOSIS — I739 Peripheral vascular disease, unspecified: Secondary | ICD-10-CM

## 2011-12-28 DIAGNOSIS — Q6680 Congenital vertical talus deformity, unspecified foot: Secondary | ICD-10-CM

## 2011-12-28 DIAGNOSIS — R29898 Other symptoms and signs involving the musculoskeletal system: Secondary | ICD-10-CM

## 2012-01-13 ENCOUNTER — Encounter (HOSPITAL_COMMUNITY): Payer: Medicare HMO

## 2012-01-13 ENCOUNTER — Ambulatory Visit (HOSPITAL_COMMUNITY)
Admission: RE | Admit: 2012-01-13 | Discharge: 2012-01-13 | Disposition: A | Discharge status: Home / Self Care | Payer: Medicare HMO | Source: Ambulatory Visit | Attending: Cardiology | Admitting: Cardiology

## 2012-01-13 DIAGNOSIS — Q6680 Congenital vertical talus deformity, unspecified foot: Secondary | ICD-10-CM

## 2012-01-13 DIAGNOSIS — I739 Peripheral vascular disease, unspecified: Secondary | ICD-10-CM

## 2012-01-13 DIAGNOSIS — L98499 Non-pressure chronic ulcer of skin of other sites with unspecified severity: Secondary | ICD-10-CM

## 2012-01-13 NOTE — Progress Notes (Signed)
BLE arterial duplex Doppler completed. Tara Jackson

## 2012-01-13 NOTE — Progress Notes (Signed)
Carotid Duplex completed. Tara Jackson

## 2012-01-15 ENCOUNTER — Emergency Department (HOSPITAL_COMMUNITY): Payer: Medicare HMO

## 2012-01-15 ENCOUNTER — Emergency Department (HOSPITAL_COMMUNITY)
Admission: EM | Admit: 2012-01-15 | Discharge: 2012-01-15 | Disposition: A | Discharge status: Home / Self Care | Payer: Medicare HMO | Attending: Emergency Medicine | Admitting: Emergency Medicine

## 2012-01-15 ENCOUNTER — Encounter (HOSPITAL_COMMUNITY): Payer: Self-pay

## 2012-01-15 DIAGNOSIS — Z79899 Other long term (current) drug therapy: Secondary | ICD-10-CM | POA: Insufficient documentation

## 2012-01-15 DIAGNOSIS — I10 Essential (primary) hypertension: Secondary | ICD-10-CM | POA: Insufficient documentation

## 2012-01-15 DIAGNOSIS — M129 Arthropathy, unspecified: Secondary | ICD-10-CM | POA: Insufficient documentation

## 2012-01-15 DIAGNOSIS — E039 Hypothyroidism, unspecified: Secondary | ICD-10-CM | POA: Insufficient documentation

## 2012-01-15 DIAGNOSIS — Z8719 Personal history of other diseases of the digestive system: Secondary | ICD-10-CM | POA: Insufficient documentation

## 2012-01-15 DIAGNOSIS — J45909 Unspecified asthma, uncomplicated: Secondary | ICD-10-CM | POA: Insufficient documentation

## 2012-01-15 DIAGNOSIS — F411 Generalized anxiety disorder: Secondary | ICD-10-CM | POA: Insufficient documentation

## 2012-01-15 DIAGNOSIS — M25519 Pain in unspecified shoulder: Secondary | ICD-10-CM | POA: Insufficient documentation

## 2012-01-15 DIAGNOSIS — E119 Type 2 diabetes mellitus without complications: Secondary | ICD-10-CM | POA: Insufficient documentation

## 2012-01-15 DIAGNOSIS — Z86711 Personal history of pulmonary embolism: Secondary | ICD-10-CM | POA: Insufficient documentation

## 2012-01-15 DIAGNOSIS — M5412 Radiculopathy, cervical region: Secondary | ICD-10-CM | POA: Insufficient documentation

## 2012-01-15 DIAGNOSIS — Z86718 Personal history of other venous thrombosis and embolism: Secondary | ICD-10-CM | POA: Insufficient documentation

## 2012-01-15 DIAGNOSIS — F329 Major depressive disorder, single episode, unspecified: Secondary | ICD-10-CM | POA: Insufficient documentation

## 2012-01-15 DIAGNOSIS — I2510 Atherosclerotic heart disease of native coronary artery without angina pectoris: Secondary | ICD-10-CM | POA: Insufficient documentation

## 2012-01-15 DIAGNOSIS — Z8673 Personal history of transient ischemic attack (TIA), and cerebral infarction without residual deficits: Secondary | ICD-10-CM | POA: Insufficient documentation

## 2012-01-15 DIAGNOSIS — F3289 Other specified depressive episodes: Secondary | ICD-10-CM | POA: Insufficient documentation

## 2012-01-15 LAB — BASIC METABOLIC PANEL
BUN: 18 mg/dL (ref 6–23)
CO2: 29 mEq/L (ref 19–32)
Calcium: 9.2 mg/dL (ref 8.4–10.5)
Chloride: 98 mEq/L (ref 96–112)
Creatinine, Ser: 1.03 mg/dL (ref 0.50–1.10)
GFR calc Af Amer: 61 mL/min — ABNORMAL LOW (ref >90)
GFR calc non Af Amer: 53 mL/min — ABNORMAL LOW (ref >90)
Glucose, Bld: 298 mg/dL — ABNORMAL HIGH (ref 70–99)
Potassium: 4.1 mEq/L (ref 3.5–5.1)
Sodium: 137 mEq/L (ref 135–145)

## 2012-01-15 LAB — URINALYSIS, ROUTINE W REFLEX MICROSCOPIC
Bilirubin Urine: NEGATIVE
Glucose, UA: 500 mg/dL — AB
Hgb urine dipstick: NEGATIVE
Leukocytes, UA: NEGATIVE
Nitrite: NEGATIVE
Protein, ur: NEGATIVE mg/dL
Specific Gravity, Urine: 1.028 (ref 1.005–1.030)
Urobilinogen, UA: 1 mg/dL (ref 0.0–1.0)
pH: 5.5 (ref 5.0–8.0)

## 2012-01-15 LAB — CBC WITH DIFFERENTIAL/PLATELET
Basophils Absolute: 0 10*3/uL (ref 0.0–0.1)
Basophils Relative: 1 % (ref 0–1)
Eosinophils Absolute: 0.2 10*3/uL (ref 0.0–0.7)
Eosinophils Relative: 2 % (ref 0–5)
HCT: 35.5 % — ABNORMAL LOW (ref 36.0–46.0)
Hemoglobin: 11.5 g/dL — ABNORMAL LOW (ref 12.0–15.0)
Lymphocytes Relative: 33 % (ref 12–46)
Lymphs Abs: 2.1 10*3/uL (ref 0.7–4.0)
MCH: 29.9 pg (ref 26.0–34.0)
MCHC: 32.4 g/dL (ref 30.0–36.0)
MCV: 92.4 fL (ref 78.0–100.0)
Monocytes Absolute: 0.4 10*3/uL (ref 0.1–1.0)
Monocytes Relative: 6 % (ref 3–12)
Neutro Abs: 3.8 10*3/uL (ref 1.7–7.7)
Neutrophils Relative %: 59 % (ref 43–77)
Platelets: 249 10*3/uL (ref 150–400)
RBC: 3.84 MIL/uL — ABNORMAL LOW (ref 3.87–5.11)
RDW: 12.5 % (ref 11.5–15.5)
WBC: 6.5 10*3/uL (ref 4.0–10.5)

## 2012-01-15 LAB — GLUCOSE, CAPILLARY
Glucose-Capillary: 269 mg/dL — ABNORMAL HIGH (ref 70–99)
Glucose-Capillary: 239 mg/dL — ABNORMAL HIGH (ref 70–99)

## 2012-01-15 LAB — SEDIMENTATION RATE: Sed Rate: 53 mm/hr — ABNORMAL HIGH (ref 0–22)

## 2012-01-15 MED ORDER — INSULIN REGULAR HUMAN 100 UNIT/ML IJ SOLN: 15.0000 [IU] | Freq: Once | INTRAMUSCULAR | Status: DC

## 2012-01-15 MED ORDER — ONDANSETRON HCL 4 MG/2ML IJ SOLN
4.0000 mg | Freq: Once | INTRAMUSCULAR | Status: AC
  Administered 2012-01-15: 4 mg via INTRAVENOUS
  Filled 2012-01-15: qty 2

## 2012-01-15 MED ORDER — ONDANSETRON HCL 4 MG/2ML IJ SOLN
INTRAMUSCULAR | Status: AC
  Filled 2012-01-15: qty 2

## 2012-01-15 MED ORDER — INSULIN GLARGINE 100 UNIT/ML ~~LOC~~ SOLN: 20.0000 [IU] | Freq: Once | SUBCUTANEOUS | Status: DC

## 2012-01-15 MED ORDER — SODIUM CHLORIDE 0.9 % IV SOLN
Freq: Once | INTRAVENOUS | Status: AC
  Administered 2012-01-15: 16:00:00 via INTRAVENOUS

## 2012-01-15 MED ORDER — HYDROMORPHONE HCL PF 2 MG/ML IJ SOLN
2.0000 mg | Freq: Once | INTRAMUSCULAR | Status: AC
  Administered 2012-01-15: 2 mg via INTRAMUSCULAR
  Filled 2012-01-15: qty 1

## 2012-01-15 MED ORDER — ONDANSETRON 4 MG PO TBDP: ORAL_TABLET | ORAL | Status: DC

## 2012-01-15 MED ORDER — KETOROLAC TROMETHAMINE 30 MG/ML IJ SOLN
30.0000 mg | Freq: Once | INTRAMUSCULAR | Status: AC
  Administered 2012-01-15: 30 mg via INTRAVENOUS
  Filled 2012-01-15: qty 1

## 2012-01-15 MED ORDER — FENTANYL CITRATE 0.05 MG/ML IJ SOLN
100.0000 ug | Freq: Once | INTRAMUSCULAR | Status: AC
  Administered 2012-01-15: 100 ug via INTRAVENOUS
  Filled 2012-01-15: qty 2

## 2012-01-15 MED ORDER — ONDANSETRON HCL 4 MG/2ML IJ SOLN
4.0000 mg | Freq: Once | INTRAMUSCULAR | Status: AC
  Administered 2012-01-15: 4 mg via INTRAVENOUS

## 2012-01-15 MED ORDER — INSULIN ASPART 100 UNIT/ML ~~LOC~~ SOLN: 10.0000 [IU] | Freq: Once | SUBCUTANEOUS | Status: DC

## 2012-01-15 MED ORDER — INSULIN REGULAR HUMAN 100 UNIT/ML IJ SOLN: 10.0000 [IU] | Freq: Once | INTRAMUSCULAR | Status: DC

## 2012-01-15 MED ORDER — INSULIN ASPART 100 UNIT/ML ~~LOC~~ SOLN
15.0000 [IU] | Freq: Once | SUBCUTANEOUS | Status: AC
  Administered 2012-01-15: 15 [IU] via SUBCUTANEOUS
  Filled 2012-01-15: qty 1

## 2012-01-15 MED ORDER — INSULIN NPH (HUMAN) (ISOPHANE) 100 UNIT/ML ~~LOC~~ SUSP
20.0000 [IU] | Freq: Once | SUBCUTANEOUS | Status: AC
  Administered 2012-01-15: 20 [IU] via SUBCUTANEOUS
  Filled 2012-01-15: qty 10

## 2012-01-15 MED ORDER — INSULIN ASPART PROT & ASPART (70-30 MIX) 100 UNIT/ML ~~LOC~~ SUSP
20.0000 [IU] | Freq: Once | SUBCUTANEOUS | Status: DC
  Filled 2012-01-15: qty 3

## 2012-01-15 MED ORDER — OXYCODONE-ACETAMINOPHEN 5-325 MG PO TABS: 2.0000 | ORAL_TABLET | ORAL | Status: DC | PRN

## 2012-01-15 NOTE — ED Provider Notes (Signed)
[id number] 71 year old female coming in with complaint of right shoulder pain that radiates into her neck and lower body x2 days. Patient woke early Thursday a.m. with the pain. She took 2 tramadol yesterday with no relief. She wore a Lidoderm patch last night with no relief. Patient is taking nothing for pain today. Patient is crying in pain and would like to see a Dr. She does not want to see a nurse practitioner. Patient did not take her insulin this morning and her Accu-Chek is greater than 200. States that one week ago she had temperature of 102. Patient has extensive past medical history including right-sided weakness from old stroke 10 years ago. 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 She would like to see a doctor. Dilaudid ordered for pain. EKG done. We will move patient to the back.   Julieta Bellini, NP 01/15/12 1256

## 2012-01-15 NOTE — ED Notes (Addendum)
Patient reports that she began having right lateral and posterior neck pain 2 days ago and now is having pain right shoulder that extends down towards right axilla. Patient reports using heat/ice and tramadol x 2 doses with no relief. Pain is worse with movement and laying flat. MAE. Patient reports that she feels like the right side of face feels swollen. Patient denies breathing or swallowing difficulties.

## 2012-01-15 NOTE — ED Notes (Signed)
CBG registered 271 on ED Glucometer.

## 2012-01-15 NOTE — ED Notes (Signed)
Patient transported to X-ray 

## 2012-01-15 NOTE — ED Provider Notes (Signed)
History     CSN: [id number]  Arrival date & time 01/15/12  1129   First MD Initiated Contact with Patient 01/15/12 1226      Chief Complaint  Patient presents with  . Neck Pain  . Shoulder Pain     HPI Patient reports that she began having right lateral and posterior neck pain 2 days ago and now is having pain right shoulder that extends down towards right axilla. Patient reports using heat/ice and tramadol x 2 doses with no relief. Pain is worse with movement and laying flat. MAE. Patient reports that she feels like the right side of face feels swollen. Patient denies breathing or swallowing difficulties.  Past Medical History  Diagnosis Date  . Diabetes mellitus   . DVT (deep venous thrombosis)   . Asthma   . Pulmonary embolus   . Hypertension   . Hypothyroidism   . Anxiety   . Depression   . Sleep apnea     cpap disontinued  . Stroke     tia and stroke. Weakness Rt hand  . H/O hiatal hernia   . Arthritis   . Anemia   . Complication of anesthesia     hard to wake up after anesthesia  . Coronary artery disease     enlarged heart, mild LVH, EF 55% by echo; no ischemia by stress 01/2010 echo [facility])    Past Surgical History  Procedure Date  . Abdominal hysterectomy   . Cholecystectomy   . Back surgery     steroid inj  . Tonsillectomy   . Appendectomy   . Esophagogastroduodenoscopy 08/09/2011    Procedure: ESOPHAGOGASTRODUODENOSCOPY (EGD);  Surgeon: Jeryl Columbia, MD;  Location: [facility];  Service: Endoscopy;  Laterality: N/A;  . Hot hemostasis 08/09/2011    Procedure: HOT HEMOSTASIS (ARGON PLASMA COAGULATION/BICAP);  Surgeon: Jeryl Columbia, MD;  Location: [facility];  Service: Endoscopy;  Laterality: N/A;  . Joint replacement     both knees  . Trigger finger release 11/22/2011    Procedure: RELEASE TRIGGER FINGER/A-1 PULLEY;  Surgeon: Meredith Pel, MD;  Location: [facility];  Service: Orthopedics;  Laterality: Left;  Left trigger thumb release    No family  history on file.  History  Substance Use Topics  . Smoking status: Never Smoker   . Smokeless tobacco: Never Used  . Alcohol Use: No    OB History    Grav Para Term Preterm Abortions TAB SAB Ect Mult Living                  Review of Systems All other systems reviewed and are negative Allergies  Iodine; Metoclopramide hcl; Sulfa antibiotics; Iohexol; Lactose intolerance (gi); and Lansoprazole  Home Medications   Current Outpatient Rx  Name  Route  Sig  Dispense  Refill  . CAPSAICIN 0.025 % EX CREA   Topical   Apply 1 application topically 2 (two) times daily as needed. applies to feet for burning         . CLONAZEPAM 0.5 MG PO TABS   Oral   Take 1 mg by mouth 2 (two) times daily.          Marland Kitchen CLOPIDOGREL BISULFATE 75 MG PO TABS   Oral   Take 75 mg by mouth daily.         . CYCLOBENZAPRINE HCL 10 MG PO TABS   Oral   Take 10 mg by mouth 2 (two) times daily.         Marland Kitchen  DOCUSATE SODIUM 100 MG PO CAPS   Oral   Take 100 mg by mouth daily.         Marland Kitchen ESOMEPRAZOLE MAGNESIUM 40 MG PO CPDR   Oral   Take 40 mg by mouth daily before breakfast.         . FUROSEMIDE 20 MG PO TABS   Oral   Take 20 mg by mouth daily as needed. For increased fluid         . GLIPIZIDE 10 MG PO TABS   Oral   Take 20 mg by mouth 2 (two) times daily before a meal.         . HYDROCHLOROTHIAZIDE 12.5 MG PO CAPS   Oral   Take 12.5 mg by mouth daily as needed. For fluid retention         . INSULIN ISOPHANE HUMAN 100 UNIT/ML [place] SUSP   Subcutaneous   Inject 10-20 Units into the skin 2 (two) times daily before a meal. 20 units qam 10 units qpm         . INSULIN REGULAR HUMAN 100 UNIT/ML IJ SOLN   Subcutaneous   Inject 20-30 Units into the skin 3 (three) times daily before meals. 30 units am 20 units at lunch and 20 units pm         . LEVOTHYROXINE SODIUM 100 MCG PO TABS   Oral   Take 100 mcg by mouth daily.         Marland Kitchen LIDOCAINE 5 % EX PTCH   Transdermal   Place 1 patch  onto the skin daily.         Marland Kitchen MECLIZINE HCL 12.5 MG PO TABS   Oral   Take 12.5 mg by mouth 3 (three) times daily as needed. dizziness         . NORTRIPTYLINE HCL 25 MG PO CAPS   Oral   Take 25 mg by mouth 2 (two) times daily. Takes along with clonazepam         . POLYETHYLENE GLYCOL 3350 PO PACK   Oral   Take 17 g by mouth daily as needed. constipation         . POTASSIUM CHLORIDE CRYS ER 20 MEQ PO TBCR   Oral   Take 20 mEq by mouth daily.         Marland Kitchen PROBIOTIC PO   Oral   Take 1 capsule by mouth daily.         Marland Kitchen ROSUVASTATIN CALCIUM 20 MG PO TABS   Oral   Take 20 mg by mouth daily.         . SUCRALFATE 1 G PO TABS   Oral   Take 1 g by mouth 2 (two) times daily.         . TERCONAZOLE 0.4 % VA CREA   Vaginal   Place 1 applicator vaginally daily as needed. Yeast infection         . TRAMADOL HCL 50 MG PO TABS   Oral   Take 50 mg by mouth every 6 (six) hours as needed. Pain         . VALSARTAN 160 MG PO TABS   Oral   Take 160 mg by mouth daily.         Marland Kitchen ONDANSETRON 4 MG PO TBDP      4mg  ODT q4 hours prn nausea/vomit   8 tablet   0   . OXYCODONE-ACETAMINOPHEN 5-325 MG PO TABS   Oral  Take 2 tablets by mouth every 4 (four) hours as needed for pain.   10 tablet   0     BP 154/60  Pulse 73  Temp 98.7 F (37.1 C) (Oral)  Resp 18  SpO2 100%  Physical Exam  Nursing note and vitals reviewed. Constitutional: She is oriented to person, place, and time. She appears well-developed and well-nourished. No distress.  HENT:  Head: Normocephalic and atraumatic.  Eyes: Pupils are equal, round, and reactive to light.  Neck: Muscular tenderness (As indicated) present.    Cardiovascular: Normal rate and intact distal pulses.   Pulses:      Carotid pulses are 2+ on the right side, and 2+ on the left side.      Radial pulses are 3+ on the right side, and 3+ on the left side.  Pulmonary/Chest: No respiratory distress.  Abdominal: Normal  appearance. She exhibits no distension.  Musculoskeletal: Normal range of motion.  Neurological: She is alert and oriented to person, place, and time. No cranial nerve deficit.  Skin: Skin is warm and dry. No rash noted.  Psychiatric: She has a normal mood and affect. Her behavior is normal.    ED Course  Procedures (including critical care time)    Medications  meclizine (ANTIVERT) 12.5 MG tablet (not administered)  traMADol (ULTRAM) 50 MG tablet (not administered)  oxyCODONE-acetaminophen (PERCOCET) 5-325 MG per tablet (not administered)  ondansetron (ZOFRAN ODT) 4 MG disintegrating tablet (not administered)  HYDROmorphone (DILAUDID) injection 2 mg (2 mg Intramuscular Given 01/15/12 1253)  fentaNYL (SUBLIMAZE) injection 100 mcg (100 mcg Intravenous Given 01/15/12 1338)  ketorolac (TORADOL) 30 MG/ML injection 30 mg (30 mg Intravenous Given 01/15/12 1337)  ondansetron (ZOFRAN) injection 4 mg (4 mg Intravenous Given 01/15/12 1335)  ondansetron (ZOFRAN) injection 4 mg (4 mg Intravenous Given 01/15/12 1501)  0.9 %  sodium chloride infusion (0  Intravenous Stopped 01/15/12 1936)  insulin aspart (novoLOG) injection 15 Units (15 Units Subcutaneous Given 01/15/12 1803)  insulin NPH (HUMULIN N,NOVOLIN N) injection 20 Units (20 Units Subcutaneous Given 01/15/12 1803)     SINUS RHYTHM ~ normal P axis,   V-rate 83 FIRST DEGREE AV BLOCK ~  PR >220, BORDERLINE R WAVE PROGRESSION, ANTERIOR LEADS ~ R < 0.26mV Abnormal ECG  Labs Reviewed  GLUCOSE, CAPILLARY - Abnormal; Notable for the following:    Glucose-Capillary 269 (*)     All other components within normal limits  CBC WITH DIFFERENTIAL - Abnormal; Notable for the following:    RBC 3.84 (*)     Hemoglobin 11.5 (*)     HCT 35.5 (*)     All other components within normal limits  BASIC METABOLIC PANEL - Abnormal; Notable for the following:    Glucose, Bld 298 (*)     GFR calc non Af Amer 53 (*)     GFR calc Af Amer 61 (*)     All other  components within normal limits  SEDIMENTATION RATE - Abnormal; Notable for the following:    Sed Rate 53 (*)     All other components within normal limits  URINALYSIS, ROUTINE W REFLEX MICROSCOPIC - Abnormal; Notable for the following:    APPearance CLOUDY (*)     Glucose, UA 500 (*)     Ketones, ur TRACE (*)     All other components within normal limits  GLUCOSE, CAPILLARY - Abnormal; Notable for the following:    Glucose-Capillary 239 (*)     All other components within normal limits  Dg Cervical Spine Complete  01/15/2012  *RADIOLOGY REPORT*  Clinical Data: 71 year old female with right neck pain - no known injury.  CERVICAL SPINE - COMPLETE 4+ VIEW  Comparison: None  Findings: Normal alignment is noted. There is no evidence of acute fracture, subluxation or prevertebral soft tissue swelling. Mild multilevel facet arthropathy is noted. The disc spaces are maintained. No focal bony lesions are present.  IMPRESSION: No evidence of acute abnormality.  Mild multilevel facet arthropathy.   Original Report Authenticated By: Margarette Canada, M.D.    Dg Shoulder Right  01/15/2012  *RADIOLOGY REPORT*  Clinical Data: Neck pain, shoulder pain  RIGHT SHOULDER - 2+ VIEW  Comparison: None.  Findings: Three views of the right shoulder submitted.  No acute fracture or subluxation.  Mild degenerative changes right AC joint.  IMPRESSION: No acute fracture or subluxation.  Mild degenerative changes right AC joint.   Original Report Authenticated By: Lahoma Crocker, M.D.    Mr Cervical Spine Wo Contrast  01/15/2012  *RADIOLOGY REPORT*  Clinical Data: Neck and shoulder pain.  MRI CERVICAL SPINE WITHOUT CONTRAST  Technique:  Multiplanar and multiecho pulse sequences of the cervical spine, to include the craniocervical junction and cervicothoracic junction, were obtained according to standard protocol without intravenous contrast.  Comparison: Cervical spine radiographs, same date.  Findings: The sagittal MR images  demonstrate normal overall alignment of the cervical vertebral bodies.  There is mild degenerative cervical spondylosis with disc disease and facet disease.  The vertebral bodies demonstrate normal marrow signal. The cervical spinal cord demonstrates normal signal intensity.  No Chiari malformation.  There is probable artifact posteriorly behind the cervical cord in the cervical and thoracic regions.  This is most likely the pulsation artifact.  I do not see any mass effect to suggest this is epidural disease (blood, abscess or tumor).  C2-3:  No significant findings.  C3-4:  Bulging annulus and mild osteophytic ridging with slight flattening of the ventral thecal sac and mild left-sided foraminal stenosis.  C4-5:  Diffuse bulging annulus, osteophytic ridging and uncinate spurring with mild mass effect on the ventral thecal sac and mild to moderate right foraminal stenosis.  C5-6:  Diffuse bulging annulus, osteophytic ridging and uncinate spurring with mild mass effect on the ventral thecal sac and right greater than left foraminal stenosis.  C6-7:  Diffuse annular bulge but no significant spinal or foraminal stenosis.  C7-T1:  No significant findings.  IMPRESSION:  1.  Normal MR appearance of the cervical spinal cord. 2.  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   Original Report Authenticated By: Marijo Sanes, M.D.      1. Cervical radicular pain       MDM          Dot Lanes, MD 01/15/12 2222

## 2012-01-15 NOTE — ED Notes (Signed)
Pt placed on 2L after fentanyl administration.

## 2012-01-15 NOTE — ED Notes (Signed)
Spoke to Dr. Annice Pih regarding discharge order and reviewed insulin given and pt's inability to eat d/t nausea. Orange juice provided and change pt back to yellow to observe for one hour, re-check CBG and discuss discharge.

## 2012-01-15 NOTE — ED Provider Notes (Addendum)
Assumed care from Dr. Audie Pinto at signout. Patient is a 71 yo F here with cervical radiculopathy. ESR mildly elevated so MRI cervical spine ordered. It showed no spinal cord compression, but some spinal stenosis. I think she likely has radiculopathy. Her pain improved with meds. Will d/c home with percocet in addition to tramadol and flexeril. She is on plavix so doesn't want to take motrin.   Wandra Arthurs, MD 01/15/12 1820  Results for orders placed during the hospital encounter of 01/15/12  GLUCOSE, CAPILLARY      Component Value Range   Glucose-Capillary 269 (*) 70 - 99 mg/dL  CBC WITH DIFFERENTIAL      Component Value Range   WBC 6.5  4.0 - 10.5 K/uL   RBC 3.84 (*) 3.87 - 5.11 MIL/uL   Hemoglobin 11.5 (*) 12.0 - 15.0 g/dL   HCT 35.5 (*) 36.0 - 46.0 %   MCV 92.4  78.0 - 100.0 fL   MCH 29.9  26.0 - 34.0 pg   MCHC 32.4  30.0 - 36.0 g/dL   RDW 12.5  11.5 - 15.5 %   Platelets 249  150 - 400 K/uL   Neutrophils Relative 59  43 - 77 %   Neutro Abs 3.8  1.7 - 7.7 K/uL   Lymphocytes Relative 33  12 - 46 %   Lymphs Abs 2.1  0.7 - 4.0 K/uL   Monocytes Relative 6  3 - 12 %   Monocytes Absolute 0.4  0.1 - 1.0 K/uL   Eosinophils Relative 2  0 - 5 %   Eosinophils Absolute 0.2  0.0 - 0.7 K/uL   Basophils Relative 1  0 - 1 %   Basophils Absolute 0.0  0.0 - 0.1 K/uL  BASIC METABOLIC PANEL      Component Value Range   Sodium 137  135 - 145 mEq/L   Potassium 4.1  3.5 - 5.1 mEq/L   Chloride 98  96 - 112 mEq/L   CO2 29  19 - 32 mEq/L   Glucose, Bld 298 (*) 70 - 99 mg/dL   BUN 18  6 - 23 mg/dL   Creatinine, Ser 1.03  0.50 - 1.10 mg/dL   Calcium 9.2  8.4 - 10.5 mg/dL   GFR calc non Af Amer 53 (*) >90 mL/min   GFR calc Af Amer 61 (*) >90 mL/min  SEDIMENTATION RATE      Component Value Range   Sed Rate 53 (*) 0 - 22 mm/hr  URINALYSIS, ROUTINE W REFLEX MICROSCOPIC      Component Value Range   Color, Urine YELLOW  YELLOW   APPearance CLOUDY (*) CLEAR   Specific Gravity, Urine 1.028  1.005 -  1.030   pH 5.5  5.0 - 8.0   Glucose, UA 500 (*) NEGATIVE mg/dL   Hgb urine dipstick NEGATIVE  NEGATIVE   Bilirubin Urine NEGATIVE  NEGATIVE   Ketones, ur TRACE (*) NEGATIVE mg/dL   Protein, ur NEGATIVE  NEGATIVE mg/dL   Urobilinogen, UA 1.0  0.0 - 1.0 mg/dL   Nitrite NEGATIVE  NEGATIVE   Leukocytes, UA NEGATIVE  NEGATIVE   Dg Cervical Spine Complete  01/15/2012  *RADIOLOGY REPORT*  Clinical Data: 71 year old female with right neck pain - no known injury.  CERVICAL SPINE - COMPLETE 4+ VIEW  Comparison: None  Findings: Normal alignment is noted. There is no evidence of acute fracture, subluxation or prevertebral soft tissue swelling. Mild multilevel facet arthropathy is noted. The disc spaces are maintained. No  focal bony lesions are present.  IMPRESSION: No evidence of acute abnormality.  Mild multilevel facet arthropathy.   Original Report Authenticated By: Margarette Canada, M.D.    Dg Shoulder Right  01/15/2012  *RADIOLOGY REPORT*  Clinical Data: Neck pain, shoulder pain  RIGHT SHOULDER - 2+ VIEW  Comparison: None.  Findings: Three views of the right shoulder submitted.  No acute fracture or subluxation.  Mild degenerative changes right AC joint.  IMPRESSION: No acute fracture or subluxation.  Mild degenerative changes right AC joint.   Original Report Authenticated By: Lahoma Crocker, M.D.    Mr Cervical Spine Wo Contrast  01/15/2012  *RADIOLOGY REPORT*  Clinical Data: Neck and shoulder pain.  MRI CERVICAL SPINE WITHOUT CONTRAST  Technique:  Multiplanar and multiecho pulse sequences of the cervical spine, to include the craniocervical junction and cervicothoracic junction, were obtained according to standard protocol without intravenous contrast.  Comparison: Cervical spine radiographs, same date.  Findings: The sagittal MR images demonstrate normal overall alignment of the cervical vertebral bodies.  There is mild degenerative cervical spondylosis with disc disease and facet disease.  The vertebral bodies  demonstrate normal marrow signal. The cervical spinal cord demonstrates normal signal intensity.  No Chiari malformation.  There is probable artifact posteriorly behind the cervical cord in the cervical and thoracic regions.  This is most likely the pulsation artifact.  I do not see any mass effect to suggest this is epidural disease (blood, abscess or tumor).  C2-3:  No significant findings.  C3-4:  Bulging annulus and mild osteophytic ridging with slight flattening of the ventral thecal sac and mild left-sided foraminal stenosis.  C4-5:  Diffuse bulging annulus, osteophytic ridging and uncinate spurring with mild mass effect on the ventral thecal sac and mild to moderate right foraminal stenosis.  C5-6:  Diffuse bulging annulus, osteophytic ridging and uncinate spurring with mild mass effect on the ventral thecal sac and right greater than left foraminal stenosis.  C6-7:  Diffuse annular bulge but no significant spinal or foraminal stenosis.  C7-T1:  No significant findings.  IMPRESSION:  1.  Normal MR appearance of the cervical spinal cord. 2.  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   Original Report Authenticated By: Marijo Sanes, M.D.       Wandra Arthurs, MD 01/15/12 [phone]

## 2012-01-15 NOTE — ED Notes (Signed)
Patient transported to MRI 

## 2012-01-15 NOTE — ED Notes (Signed)
MD at bedside. 

## 2012-01-15 NOTE — ED Notes (Signed)
Notified by radiology that pt became diaphoretic and clammy while in radiology. Pt assessed, skin cool to touch, head and face diaphoretic. Pt c/o dry mouth and nausea. Denies chest pain, endorses slight shortness of breath. CBG and Zofran ordered. Dr. Audie Pinto notified of same. Pt did not take her insulin this morning.

## 2012-01-17 LAB — GLUCOSE, CAPILLARY: Glucose-Capillary: 271 mg/dL — ABNORMAL HIGH (ref 70–99)

## 2012-01-25 ENCOUNTER — Ambulatory Visit
Admission: RE | Admit: 2012-01-25 | Discharge: 2012-01-25 | Disposition: A | Discharge status: Home / Self Care | Payer: Managed Care, Other (non HMO) | Source: Ambulatory Visit | Attending: Internal Medicine | Admitting: Internal Medicine

## 2012-01-25 DIAGNOSIS — Z1231 Encounter for screening mammogram for malignant neoplasm of breast: Secondary | ICD-10-CM

## 2012-01-26 ENCOUNTER — Ambulatory Visit (HOSPITAL_COMMUNITY)
Admission: RE | Admit: 2012-01-26 | Discharge: 2012-01-26 | Disposition: A | Discharge status: Home / Self Care | Payer: Medicare HMO | Source: Ambulatory Visit | Attending: Cardiology | Admitting: Cardiology

## 2012-01-26 DIAGNOSIS — E119 Type 2 diabetes mellitus without complications: Secondary | ICD-10-CM | POA: Insufficient documentation

## 2012-01-26 DIAGNOSIS — R9431 Abnormal electrocardiogram [ECG] [EKG]: Secondary | ICD-10-CM | POA: Insufficient documentation

## 2012-01-26 DIAGNOSIS — J449 Chronic obstructive pulmonary disease, unspecified: Secondary | ICD-10-CM | POA: Insufficient documentation

## 2012-01-26 DIAGNOSIS — M790 Rheumatism, unspecified: Secondary | ICD-10-CM

## 2012-01-26 DIAGNOSIS — I10 Essential (primary) hypertension: Secondary | ICD-10-CM | POA: Insufficient documentation

## 2012-01-26 DIAGNOSIS — J4489 Other specified chronic obstructive pulmonary disease: Secondary | ICD-10-CM | POA: Insufficient documentation

## 2012-01-26 DIAGNOSIS — E669 Obesity, unspecified: Secondary | ICD-10-CM | POA: Insufficient documentation

## 2012-01-26 MED ORDER — TECHNETIUM TC 99M SESTAMIBI GENERIC - CARDIOLITE
32.0000 | Freq: Once | INTRAVENOUS | Status: AC | PRN
  Administered 2012-01-26: 32 via INTRAVENOUS

## 2012-01-26 MED ORDER — REGADENOSON 0.4 MG/5ML IV SOLN
0.4000 mg | Freq: Once | INTRAVENOUS | Status: AC
  Administered 2012-01-26: 0.4 mg via INTRAVENOUS

## 2012-01-26 MED ORDER — TECHNETIUM TC 99M SESTAMIBI GENERIC - CARDIOLITE
10.2000 | Freq: Once | INTRAVENOUS | Status: AC | PRN
  Administered 2012-01-26: 10 via INTRAVENOUS

## 2012-01-26 MED ORDER — AMINOPHYLLINE 25 MG/ML IV SOLN
75.0000 mg | Freq: Once | INTRAVENOUS | Status: AC
  Administered 2012-01-26: 75 mg via INTRAVENOUS

## 2012-01-26 NOTE — Procedures (Addendum)
[facility] [address] [place] [address] [id number]  Cardiology Nuclear Med Study  Tara Jackson is a 71 y.o. female     MRN : [id number]     DOB: 1940-08-31  Procedure Date: 01/26/2012  Nuclear Med Background Indication for Stress Test:  Evaluation for Ischemia and Abnormal EKG History:  COPD and CVA, PVD Cardiac Risk Factors: Hypertension, IDDM Type 2, Lipids and Obesity  Symptoms:  Chest Pain, Dizziness and DOE   Nuclear Pre-Procedure Caffeine/Decaff Intake:  10:00pm NPO After: 7:30am   IV Site: L Antecubital  IV 0.9% NS with Angio Cath:  22g  Chest Size (in):  n/a IV Started by: Otho Perl, CNMT  Height: 5\' 7"  (1.702 m)  Cup Size: 46C  BMI:  Body mass index is 43.70 kg/(m^2). Weight:  279 lb (126.554 kg)   Tech Comments:  n/a    Nuclear Med Study 1 or 2 day study: 1 day  Stress Test Type:  [place] Provider:  Glenetta Hew, MD   Resting Radionuclide: Technetium 42m Sestamibi  Resting Radionuclide Dose: 10.2 mCi   Stress Radionuclide:  Technetium 46m Sestamibi  Stress Radionuclide Dose: 32.0 mCi           Stress Protocol Rest HR: 57 Stress HR: 76  Rest BP: 148/74 Stress BP:160/67  Exercise Time (min): n/a METS: n/a          Dose of Adenosine (mg):  n/a Dose of Lexiscan: 0.4 mg  Dose of Atropine (mg): n/a Dose of Dobutamine: n/a mcg/kg/min (at max HR)  Stress Test Technologist: Mellody Memos, CCT Nuclear Technologist: Imagene Riches, CNMT   Rest Procedure:  Myocardial perfusion imaging was performed at rest 45 minutes following the intravenous administration of Technetium 76m Sestamibi. Stress Procedure:  The patient received IV Lexiscan 0.4 mg over 15-seconds.  Technetium 41m Sestamibi injected at 30-seconds.  Patient was given 75 mg of aminophylline through her IV due to chest heaviness and head pressure. Symptoms were resolved after an eight minute recovery time.There were  no significant changes with Lexiscan.  Quantitative spect images were obtained after a 45 minute delay.  Transient Ischemic Dilatation (Normal <1.22):  0.85 Lung/Heart Ratio (Normal <0.45):  0.25 QGS EDV:  69 ml QGS ESV:  14 ml LV Ejection Fraction: 79%  Signed by      Rest ECG: NSR - Normal EKG  Stress ECG: No significant change from baseline ECG  QPS Raw Data Images:  Normal; no motion artifact; normal heart/lung ratio. Stress Images:  Mild to moderate decrease counts inferolaterally Rest Images:  Normal homogeneous uptake in all areas of the myocardium. Subtraction (SDS):  These findings are consistent with ischemia.  Impression Exercise Capacity:  Lexiscan with no exercise. BP Response:  Normal blood pressure response. Clinical Symptoms:  Typical chest pain. ECG Impression:  No significant ST segment change suggestive of ischemia. Comparison with Prior Nuclear Study: New changes since previous study  Overall Impression:  Intermediate stress nuclear study.  LV Wall Motion:  NL LV Function; NL Wall Motion  Pt will follow up with Dr. Ysidro Evert, MD  01/26/2012 6:09 PM

## 2012-01-30 ENCOUNTER — Encounter (HOSPITAL_COMMUNITY): Payer: Medicare HMO

## 2012-02-02 ENCOUNTER — Ambulatory Visit (HOSPITAL_COMMUNITY)
Admission: RE | Admit: 2012-02-02 | Discharge: 2012-02-02 | Disposition: A | Discharge status: Home / Self Care | Payer: Medicare HMO | Source: Ambulatory Visit | Attending: Cardiology | Admitting: Cardiology

## 2012-02-02 DIAGNOSIS — M7989 Other specified soft tissue disorders: Secondary | ICD-10-CM | POA: Insufficient documentation

## 2012-02-02 DIAGNOSIS — R609 Edema, unspecified: Secondary | ICD-10-CM

## 2012-02-02 DIAGNOSIS — Z8739 Personal history of other diseases of the musculoskeletal system and connective tissue: Secondary | ICD-10-CM

## 2012-02-02 DIAGNOSIS — M79609 Pain in unspecified limb: Secondary | ICD-10-CM | POA: Insufficient documentation

## 2012-02-02 DIAGNOSIS — R29898 Other symptoms and signs involving the musculoskeletal system: Secondary | ICD-10-CM

## 2012-02-02 NOTE — Progress Notes (Signed)
BLE venous duplex completed. Alla German

## 2012-02-08 DIAGNOSIS — I2510 Atherosclerotic heart disease of native coronary artery without angina pectoris: Secondary | ICD-10-CM

## 2012-02-08 HISTORY — DX: Atherosclerotic heart disease of native coronary artery without angina pectoris: I25.10

## 2012-02-09 ENCOUNTER — Encounter (HOSPITAL_COMMUNITY): Payer: Self-pay | Admitting: Cardiology

## 2012-02-09 DIAGNOSIS — R079 Chest pain, unspecified: Secondary | ICD-10-CM | POA: Diagnosis present

## 2012-02-10 ENCOUNTER — Encounter (HOSPITAL_COMMUNITY): Payer: Self-pay | Admitting: Pharmacy Technician

## 2012-02-10 ENCOUNTER — Other Ambulatory Visit: Payer: Self-pay | Admitting: *Deleted

## 2012-02-13 ENCOUNTER — Other Ambulatory Visit: Payer: Self-pay | Admitting: Cardiology

## 2012-02-14 ENCOUNTER — Ambulatory Visit (HOSPITAL_COMMUNITY)
Admission: RE | Admit: 2012-02-14 | Discharge: 2012-02-14 | Disposition: A | Discharge status: Home / Self Care | Payer: Medicare HMO | Source: Ambulatory Visit | Attending: Cardiology | Admitting: Cardiology

## 2012-02-14 ENCOUNTER — Encounter (HOSPITAL_COMMUNITY)
Admission: RE | Disposition: A | Discharge status: Home / Self Care | Payer: Self-pay | Source: Ambulatory Visit | Attending: Cardiology

## 2012-02-14 DIAGNOSIS — Z9889 Other specified postprocedural states: Secondary | ICD-10-CM

## 2012-02-14 DIAGNOSIS — R9439 Abnormal result of other cardiovascular function study: Secondary | ICD-10-CM | POA: Insufficient documentation

## 2012-02-14 DIAGNOSIS — I10 Essential (primary) hypertension: Secondary | ICD-10-CM | POA: Insufficient documentation

## 2012-02-14 DIAGNOSIS — Z794 Long term (current) use of insulin: Secondary | ICD-10-CM | POA: Insufficient documentation

## 2012-02-14 DIAGNOSIS — R079 Chest pain, unspecified: Secondary | ICD-10-CM | POA: Insufficient documentation

## 2012-02-14 DIAGNOSIS — E119 Type 2 diabetes mellitus without complications: Secondary | ICD-10-CM | POA: Insufficient documentation

## 2012-02-14 DIAGNOSIS — R0602 Shortness of breath: Secondary | ICD-10-CM | POA: Insufficient documentation

## 2012-02-14 DIAGNOSIS — E785 Hyperlipidemia, unspecified: Secondary | ICD-10-CM | POA: Diagnosis present

## 2012-02-14 HISTORY — DX: Hyperlipidemia, unspecified: E78.5

## 2012-02-14 HISTORY — DX: Hypertension secondary to endocrine disorders: I15.2

## 2012-02-14 HISTORY — DX: Long term (current) use of insulin: Z79.4

## 2012-02-14 HISTORY — DX: Other pulmonary embolism without acute cor pulmonale: I26.99

## 2012-02-14 HISTORY — DX: Type 2 diabetes mellitus with other circulatory complications: E11.59

## 2012-02-14 HISTORY — PX: LEFT HEART CATHETERIZATION WITH CORONARY ANGIOGRAM: SHX5451

## 2012-02-14 HISTORY — DX: Type 2 diabetes mellitus without complications: E11.9

## 2012-02-14 HISTORY — DX: Essential (primary) hypertension: I10

## 2012-02-14 LAB — GLUCOSE, CAPILLARY
Glucose-Capillary: 390 mg/dL — ABNORMAL HIGH (ref 70–99)
Glucose-Capillary: 396 mg/dL — ABNORMAL HIGH (ref 70–99)

## 2012-02-14 SURGERY — LEFT HEART CATHETERIZATION WITH CORONARY ANGIOGRAM
Anesthesia: LOCAL

## 2012-02-14 MED ORDER — SODIUM CHLORIDE 0.9 % IJ SOLN: 3.0000 mL | INTRAMUSCULAR | Status: DC | PRN

## 2012-02-14 MED ORDER — HEPARIN SODIUM (PORCINE) 1000 UNIT/ML IJ SOLN
INTRAMUSCULAR | Status: AC
  Filled 2012-02-14: qty 1

## 2012-02-14 MED ORDER — METHYLPREDNISOLONE SODIUM SUCC 125 MG IJ SOLR
125.0000 mg | INTRAMUSCULAR | Status: AC
  Administered 2012-02-14: 125 mg via INTRAVENOUS
  Filled 2012-02-14: qty 2

## 2012-02-14 MED ORDER — PREDNISONE 20 MG PO TABS: 60.0000 mg | ORAL_TABLET | ORAL | Status: DC

## 2012-02-14 MED ORDER — SODIUM CHLORIDE 0.9 % IJ SOLN: 3.0000 mL | Freq: Two times a day (BID) | INTRAMUSCULAR | Status: DC

## 2012-02-14 MED ORDER — LIDOCAINE HCL (PF) 1 % IJ SOLN
INTRAMUSCULAR | Status: AC
  Filled 2012-02-14: qty 30

## 2012-02-14 MED ORDER — INSULIN ASPART 100 UNIT/ML ~~LOC~~ SOLN
0.0000 [IU] | Freq: Three times a day (TID) | SUBCUTANEOUS | Status: DC
  Administered 2012-02-14 (×2): 9 [IU] via SUBCUTANEOUS

## 2012-02-14 MED ORDER — HEPARIN (PORCINE) IN NACL 2-0.9 UNIT/ML-% IJ SOLN
INTRAMUSCULAR | Status: AC
  Filled 2012-02-14: qty 1000

## 2012-02-14 MED ORDER — SODIUM CHLORIDE 0.9 % IV SOLN
INTRAVENOUS | Status: DC
  Administered 2012-02-14: 10:00:00 via INTRAVENOUS

## 2012-02-14 MED ORDER — ASPIRIN 81 MG PO CHEW
324.0000 mg | CHEWABLE_TABLET | ORAL | Status: AC
  Administered 2012-02-14: 324 mg via ORAL
  Filled 2012-02-14: qty 4

## 2012-02-14 MED ORDER — FAMOTIDINE IN NACL 20-0.9 MG/50ML-% IV SOLN
INTRAVENOUS | Status: AC
  Filled 2012-02-14: qty 50

## 2012-02-14 MED ORDER — VERAPAMIL HCL 2.5 MG/ML IV SOLN
INTRAVENOUS | Status: AC
  Filled 2012-02-14: qty 2

## 2012-02-14 MED ORDER — FENTANYL CITRATE 0.05 MG/ML IJ SOLN
INTRAMUSCULAR | Status: AC
  Filled 2012-02-14: qty 2

## 2012-02-14 MED ORDER — DIPHENHYDRAMINE HCL 50 MG/ML IJ SOLN
25.0000 mg | INTRAMUSCULAR | Status: AC
  Administered 2012-02-14: 25 mg via INTRAVENOUS
  Filled 2012-02-14: qty 1

## 2012-02-14 MED ORDER — NITROGLYCERIN 0.2 MG/ML ON CALL CATH LAB
INTRAVENOUS | Status: AC
  Filled 2012-02-14: qty 1

## 2012-02-14 MED ORDER — SODIUM CHLORIDE 0.9 % IV SOLN: 250.0000 mL | INTRAVENOUS | Status: DC | PRN

## 2012-02-14 MED ORDER — MIDAZOLAM HCL 2 MG/2ML IJ SOLN
INTRAMUSCULAR | Status: AC
  Filled 2012-02-14: qty 2

## 2012-02-14 MED ORDER — FAMOTIDINE IN NACL 20-0.9 MG/50ML-% IV SOLN: 20.0000 mg | INTRAVENOUS | Status: DC

## 2012-02-14 NOTE — H&P (Addendum)
History and Physical Interval Note:  NAME:  Tara Jackson   MRN: [id number] DOB:  May 29, 1940   ADMIT DATE: 02/14/2012   02/14/2012 10:01 AM  Tara Jackson is a 72 y.o. female with multiple Cardiac RFs who has been noting increasing shortness of breath on exertion with intermittent chest pain. She had a Myoview  ST that was read as Intermediate risk with Inferior & Inferolateral Ischemia.  Please see my dictated clinic note in the shadow chart for details. She has been premedicated for history of IVP Anaphylactoid reaction years ago.  Has had a catheterization since without issues. She is referred for Cardiac catheterization.   Past Medical History  Diagnosis Date  . DVT (deep venous thrombosis)   . Asthma   . Hypothyroidism   . Anxiety   . Depression   . Sleep apnea     cpap disontinued  . Stroke     tia and stroke. Weakness Rt hand  . H/O hiatal hernia   . Arthritis   . Anemia   . Complication of anesthesia     hard to wake up after anesthesia  . LVH (left ventricular hypertrophy) due to hypertensive disease     enlarged heart, mild LVH, EF 55% by echo; no ischemia by stress 01/2010 echo [facility])  . 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 Hyperlipidemia   . Diabetes mellitus type 2, insulin dependent   . Hypertension associated with diabetes   . Sleep apnea    Past Surgical History  Procedure Date  . Abdominal hysterectomy   . Cholecystectomy   . Back surgery     steroid inj  . Tonsillectomy   . Appendectomy   . Esophagogastroduodenoscopy 08/09/2011    Procedure: ESOPHAGOGASTRODUODENOSCOPY (EGD);  Surgeon: Jeryl Columbia, MD;  Location: [facility];  Service: Endoscopy;  Laterality: N/A;  . Hot hemostasis 08/09/2011    Procedure: HOT HEMOSTASIS (ARGON PLASMA COAGULATION/BICAP);  Surgeon: Jeryl Columbia, MD;  Location: [facility];  Service: Endoscopy;  Laterality: N/A;  . Joint replacement     both  knees  . Trigger finger release 11/22/2011    Procedure: RELEASE TRIGGER FINGER/A-1 PULLEY;  Surgeon: Meredith Pel, MD;  Location: [facility];  Service: Orthopedics;  Laterality: Left;  Left trigger thumb release    FAMHx: No family history on file.  SOCHx:  reports that she has never smoked. She has never used smokeless tobacco. She reports that she does not drink alcohol or use illicit drugs.  ALLERGIES: Allergies  Allergen Reactions  . Iodine Other (See Comments)    Makes unconscious  . Metoclopramide Hcl Other (See Comments)    suicidal  . Sulfa Antibiotics Hives  . Iohexol      Code: SOB, Desc: cardiac arrest w/ iv contrast, has never used 13 hr prep//alice calhoun, Onset Date: [id number]   . Lactose Intolerance (Gi)   . Lansoprazole Nausea And Vomiting    HOME MEDICATIONS: Prescriptions prior to admission  Medication Sig Dispense Refill  . Ascorbic Acid (VITAMIN C) 100 MG tablet Take 100 mg by mouth daily.      . clonazePAM (KLONOPIN) 0.5 MG tablet Take 1 mg by mouth 2 (two) times daily.       . clopidogrel (PLAVIX) 75 MG tablet Take 75 mg by mouth daily.      . cyclobenzaprine (FLEXERIL) 10 MG tablet Take 10 mg by mouth  2 (two) times daily.      Marland Kitchen docusate sodium (COLACE) 100 MG capsule Take 100 mg by mouth daily.      Marland Kitchen esomeprazole (NEXIUM) 40 MG capsule Take 40 mg by mouth daily before breakfast.      . furosemide (LASIX) 20 MG tablet Take 20 mg by mouth daily as needed. For increased fluid      . glipiZIDE (GLUCOTROL) 10 MG tablet Take 20 mg by mouth 2 (two) times daily before a meal.      . hydrochlorothiazide (MICROZIDE) 12.5 MG capsule Take 12.5 mg by mouth daily as needed. For fluid retention      . insulin NPH (HUMULIN N,NOVOLIN N) 100 UNIT/ML injection Inject 10-20 Units into the skin 2 (two) times daily before a meal. 20 units qam 10 units qpm      . insulin regular (NOVOLIN R,HUMULIN R) 100 units/mL injection Inject 20-30 Units into the skin 3 (three) times  daily before meals. 30 units am 20 units at lunch and 20 units pm      . levothyroxine (SYNTHROID, LEVOTHROID) 100 MCG tablet Take 100 mcg by mouth daily.      Marland Kitchen lidocaine (LIDODERM) 5 % Place 1 patch onto the skin daily.      . meclizine (ANTIVERT) 12.5 MG tablet Take 12.5 mg by mouth 3 (three) times daily as needed. dizziness      . nortriptyline (PAMELOR) 25 MG capsule Take 25 mg by mouth 2 (two) times daily. Takes along with clonazepam      . ondansetron (ZOFRAN ODT) 4 MG disintegrating tablet 4mg  ODT q4 hours prn nausea/vomit  8 tablet  0  . oxyCODONE-acetaminophen (PERCOCET) 5-325 MG per tablet Take 2 tablets by mouth every 4 (four) hours as needed for pain.  10 tablet  0  . polyethylene glycol (MIRALAX / GLYCOLAX) packet Take 17 g by mouth daily as needed. constipation      . potassium chloride SA (K-DUR,KLOR-CON) 20 MEQ tablet Take 20 mEq by mouth daily.      . Probiotic Product (PROBIOTIC PO) Take 1 capsule by mouth daily.      . rosuvastatin (CRESTOR) 20 MG tablet Take 20 mg by mouth daily.      . sucralfate (CARAFATE) 1 G tablet Take 1 g by mouth 2 (two) times daily.      Marland Kitchen terconazole (TERAZOL 7) 0.4 % vaginal cream Place 1 applicator vaginally daily as needed. Yeast infection      . traMADol (ULTRAM) 50 MG tablet Take 50 mg by mouth every 6 (six) hours as needed. Pain      . valsartan (DIOVAN) 160 MG tablet Take 160 mg by mouth daily.        PHYSICAL EXAM:Blood pressure 165/78, pulse 95, temperature 97.7 F (36.5 C), temperature source Oral, resp. rate 20, height 5' 7.5" (1.715 m), weight 124.739 kg (275 lb), SpO2 100.00%. General appearance: alert, cooperative, appears stated age and no distress Neck: no adenopathy, no carotid bruit and no JVD Lungs: clear to auscultation bilaterally and normal percussion bilaterally Heart: regular rate and rhythm, S1, S2 normal, no murmur, click, rub or gallop Abdomen: soft, non-tender; bowel sounds normal; no masses,  no organomegaly and mildly  obese Extremities: extremities normal, atraumatic, no cyanosis or edema Neurologic: Grossly normal  IMPRESSION & PLAN The patients' history has been reviewed, patient examined, no change in status from most recent note, stable for surgery. I have reviewed the patients' chart and labs. Questions were answered  to the patient's satisfaction.   Principal Problem:  *Abnormal nuclear cardiac imaging test -- Intermediate Risk with inferior-inferolateral ischemia. Active Problems:  DYSPNEA -- worsening exertional shortness of breath  Chest pain, exertional -- with shortness of Breath;  HYPERLIPIDEMIA  HYPERTENSION  Tara Jackson has presented today for surgery, with the diagnosis of Abnormal Myoview (worsening dyspnea on exertion as possible Anginal equivalent).  She is referred for cardiac catheterization.     The various methods of treatment have been discussed with the patient and family.   Risks / Complications include, but not limited to: Death, MI, CVA/TIA, VF/VT (with defibrillation), Bradycardia (need for temporary pacer placement), contrast induced nephropathy, bleeding / bruising / hematoma / pseudoaneurysm, vascular or coronary injury (with possible emergent CT or Vascular Surgery), adverse medication reactions, infection.     After consideration of risks, benefits and other options for treatment, the patient has consented to Procedure(s):  LEFT HEART CATHETERIZATION AND CORONARY ANGIOGRAPHY +/- AD Dolores  as a surgical intervention.   We will proceed with the planned procedure.   [place] [place], [place]  [address]  [phone]  02/14/2012 10:01 AM

## 2012-02-14 NOTE — CV Procedure (Signed)
[facility] CARDIAC CATHETERIZATION REPORT  NAME:  [name]   MRN: [id number] DOB:  December 25, 1940   ADMIT DATE: 02/14/2012 Procedure Date: 02/14/2012  INTERVENTIONAL CARDIOLOGIST: Leonie Man, M.D., MS PRIMARY CARE PROVIDER: [name], MD PRIMARY CARDIOLOGIST: Leonie Man, M.D., MS   PATIENT:  Tara Jackson is a 72 y.o. female with multiple Cardiac RFs who has been noting increasing shortness of breath on exertion with intermittent chest pain. She had a Myoview ST that was read as Intermediate risk with Inferior & Inferolateral Ischemia. Please see my dictated clinic note in the shadow chart for details.  She has been premedicated for history of IVP Anaphylactoid reaction years ago. Has had a catheterization since without issues.  She is referred for Cardiac catheterization.  PRE-OPERATIVE DIAGNOSIS:    Abnormal Myoview: Intermediate risk, inferolateral ischemia  Shortness of breath with exertion  PROCEDURES PERFORMED:    Left Heart Catheterization with Native Coronary Angiography via Right Radial Artery Access  PROCEDURE: Consent: Risks of procedure as well as the alternatives and risks of each were explained to the (patient/caregiver).  Consent for procedure obtained. Consent for signed by MD and patient with RN witness -- placed on chart.  PROCEDURE: The patient was brought to the 2nd [facility] in the fasting state and prepped and draped in the usual sterile fashion for Right radial access after a modified Allen's test demonstrated excellent ulnar artery flow. Sterile technique was used including antiseptics, cap, gloves, gown, hand hygiene, mask and sheet.  Skin prep: Chlorhexidine.  Time Out: Verified patient identification, verified procedure, site/side was marked, verified correct patient position, special equipment/implants available, medications/allergies/relevent history reviewed, required imaging and  test results available.  Performed   Access:  Right Radial Artery;  5 Fr  GlideSheath - using Seldinger technique with the Angiocath Micropuncture Kit   Diagnostic:  TIG 4.0 -- advanced and removed over Versicore wire used for Left and Right Coronary Angiography as well as LV hemodynamics.   TR Band:  1225 Hours, 13 mL air  ANESTHESIA:   Local Lidocaine 2 ml SEDATION:  2 mg IV Versed, 25 mcg IV fentanyl ; Premedication: 5mg  PO Valium; Benadryl 25 mg IV, Pepcide 20mg  IV; 2 PO doses of Prednisone yesterday & 125mg  IV Solumedrol precath. MEDICATIONS: Radial Cocktail: 5 mg Verapamil, 400 mcg NTG, 2 ml 2% Lidocaine in 10 ml NS  Anticoagulation:  5500 Units IV Heparin  232ml NS bolus  EBL:   < 5 ml  Hemodynamics:  Central Aortic / Mean Pressures: 120/60 mmHg; 80 mmHg  Left Ventricular Pressures / EDP: 120/1 mmHg; 1 mmHg  Left Ventriculography:  Not done  Coronary Anatomy:  Left Main:  Large caliber vessel trifurcates after long possible segment into the the LAD, Ramus Intermedius, and Circumflex  LAD:  Large-caliber vessel that tapers into a relatively small vessel before reaching the apex. There is diffuse mild luminal irregularities. The main branch from this vessel is a large septal trunk and several smaller septals. Gross small diagonal branches but nothing significant.  Left Circumflex:  Moderate-to- large-caliber vessel that is nondominant. There is a small proximal OM branch before it gives off the AV groove circumflex. It then terminates in a lateral OM that gives off 2 branches. The AV groove circumflex place gives off a atrial branch. There mild luminal irregularities throughout but nothing significant.  Ramus intermedius:  Large-caliber vessel that is at least as big as the LAD and essentially serves the  purposes of the obtuse marginal and diagonal branches. It gives off a very proximal branch that is serves as a diagonal distribution as well as next branch which is a more OM  that distribution. As a small branch with the long tubular 40% stenosis. The ramus that continues on normal to the apex. There is mild luminal irregularities may be a 40% lesion in the midportion as long tubular. Nothing significant explain the ischemia.   RCA:  Large caliber dominant vessel with 1 small and 1 moderate caliber RV marginal branches. The vessel then bifurcates distally into the posterior descending artery and the right posterior AV groove branch. There is minimal luminal irregularities throughout.  RPDA:  At least moderate caliber vessel it reaches all again the apex. Mild luminal irregularities.  RPL Sysytem:The Right Posterior AV Groove Branch  is a moderate caliber vessel that gives off 2 small to moderate posterior lateral branches within the most distal one extending and all the way to the anterolateral wall. No evidence of any stenosis explain inferolateral ischemia.  PATIENT DISPOSITION:    The patient was transferred to the PACU holding area in a hemodynamicaly stable, chest pain free condition.  The patient tolerated the procedure well, and there were no complications.  The patient was stable before, during, and after the procedure.  POST-OPERATIVE DIAGNOSIS:    No evidence of obstructive CAD.  Low EDP with known normal EF  False Positive Myoview ST.  PLAN OF CARE:  Standard post Radial Cath Care - d/c Home today after PO Prednisone dose  Monitor for Sx of IVP reaction.  ROV with me as scheduled.   Leonie Man, M.D., M.S. [facility] [address] [place], [place]  [address]  [phone]  02/14/2012 12:37 PM

## 2012-03-26 ENCOUNTER — Encounter (HOSPITAL_COMMUNITY): Payer: Self-pay | Admitting: *Deleted

## 2012-03-26 ENCOUNTER — Emergency Department (HOSPITAL_COMMUNITY)
Admission: EM | Admit: 2012-03-26 | Discharge: 2012-03-26 | Disposition: A | Discharge status: Home / Self Care | Payer: Medicare HMO | Attending: Emergency Medicine | Admitting: Emergency Medicine

## 2012-03-26 DIAGNOSIS — Z87828 Personal history of other (healed) physical injury and trauma: Secondary | ICD-10-CM | POA: Insufficient documentation

## 2012-03-26 DIAGNOSIS — I119 Hypertensive heart disease without heart failure: Secondary | ICD-10-CM | POA: Insufficient documentation

## 2012-03-26 DIAGNOSIS — Z8719 Personal history of other diseases of the digestive system: Secondary | ICD-10-CM | POA: Insufficient documentation

## 2012-03-26 DIAGNOSIS — Z8673 Personal history of transient ischemic attack (TIA), and cerebral infarction without residual deficits: Secondary | ICD-10-CM | POA: Insufficient documentation

## 2012-03-26 DIAGNOSIS — R5383 Other fatigue: Secondary | ICD-10-CM | POA: Insufficient documentation

## 2012-03-26 DIAGNOSIS — R61 Generalized hyperhidrosis: Secondary | ICD-10-CM | POA: Insufficient documentation

## 2012-03-26 DIAGNOSIS — E119 Type 2 diabetes mellitus without complications: Secondary | ICD-10-CM | POA: Insufficient documentation

## 2012-03-26 DIAGNOSIS — Z7902 Long term (current) use of antithrombotics/antiplatelets: Secondary | ICD-10-CM | POA: Insufficient documentation

## 2012-03-26 DIAGNOSIS — Z8739 Personal history of other diseases of the musculoskeletal system and connective tissue: Secondary | ICD-10-CM | POA: Insufficient documentation

## 2012-03-26 DIAGNOSIS — F329 Major depressive disorder, single episode, unspecified: Secondary | ICD-10-CM | POA: Insufficient documentation

## 2012-03-26 DIAGNOSIS — Z862 Personal history of diseases of the blood and blood-forming organs and certain disorders involving the immune mechanism: Secondary | ICD-10-CM | POA: Insufficient documentation

## 2012-03-26 DIAGNOSIS — E039 Hypothyroidism, unspecified: Secondary | ICD-10-CM | POA: Insufficient documentation

## 2012-03-26 DIAGNOSIS — J45909 Unspecified asthma, uncomplicated: Secondary | ICD-10-CM | POA: Insufficient documentation

## 2012-03-26 DIAGNOSIS — Z79899 Other long term (current) drug therapy: Secondary | ICD-10-CM | POA: Insufficient documentation

## 2012-03-26 DIAGNOSIS — N390 Urinary tract infection, site not specified: Secondary | ICD-10-CM | POA: Insufficient documentation

## 2012-03-26 DIAGNOSIS — IMO0002 Reserved for concepts with insufficient information to code with codable children: Secondary | ICD-10-CM | POA: Insufficient documentation

## 2012-03-26 DIAGNOSIS — Z86711 Personal history of pulmonary embolism: Secondary | ICD-10-CM | POA: Insufficient documentation

## 2012-03-26 DIAGNOSIS — F411 Generalized anxiety disorder: Secondary | ICD-10-CM | POA: Insufficient documentation

## 2012-03-26 DIAGNOSIS — F3289 Other specified depressive episodes: Secondary | ICD-10-CM | POA: Insufficient documentation

## 2012-03-26 DIAGNOSIS — Z8709 Personal history of other diseases of the respiratory system: Secondary | ICD-10-CM | POA: Insufficient documentation

## 2012-03-26 DIAGNOSIS — R5381 Other malaise: Secondary | ICD-10-CM | POA: Insufficient documentation

## 2012-03-26 DIAGNOSIS — E785 Hyperlipidemia, unspecified: Secondary | ICD-10-CM | POA: Insufficient documentation

## 2012-03-26 DIAGNOSIS — I517 Cardiomegaly: Secondary | ICD-10-CM | POA: Insufficient documentation

## 2012-03-26 DIAGNOSIS — Z794 Long term (current) use of insulin: Secondary | ICD-10-CM | POA: Insufficient documentation

## 2012-03-26 DIAGNOSIS — R6883 Chills (without fever): Secondary | ICD-10-CM | POA: Insufficient documentation

## 2012-03-26 DIAGNOSIS — R531 Weakness: Secondary | ICD-10-CM

## 2012-03-26 LAB — COMPREHENSIVE METABOLIC PANEL
ALT: 5 U/L (ref 0–35)
AST: 11 U/L (ref 0–37)
Albumin: 3.3 g/dL — ABNORMAL LOW (ref 3.5–5.2)
Alkaline Phosphatase: 68 U/L (ref 39–117)
BUN: 14 mg/dL (ref 6–23)
CO2: 29 mEq/L (ref 19–32)
Calcium: 8.7 mg/dL (ref 8.4–10.5)
Chloride: 101 mEq/L (ref 96–112)
Creatinine, Ser: 0.93 mg/dL (ref 0.50–1.10)
GFR calc Af Amer: 69 mL/min — ABNORMAL LOW (ref >90)
GFR calc non Af Amer: 60 mL/min — ABNORMAL LOW (ref >90)
Glucose, Bld: 222 mg/dL — ABNORMAL HIGH (ref 70–99)
Potassium: 4 mEq/L (ref 3.5–5.1)
Sodium: 139 mEq/L (ref 135–145)
Total Bilirubin: 0.4 mg/dL (ref 0.3–1.2)
Total Protein: 6.7 g/dL (ref 6.0–8.3)

## 2012-03-26 LAB — CBC WITH DIFFERENTIAL/PLATELET
Basophils Absolute: 0 10*3/uL (ref 0.0–0.1)
Basophils Relative: 1 % (ref 0–1)
Eosinophils Absolute: 0 10*3/uL (ref 0.0–0.7)
Eosinophils Relative: 1 % (ref 0–5)
HCT: 34.8 % — ABNORMAL LOW (ref 36.0–46.0)
Hemoglobin: 11.2 g/dL — ABNORMAL LOW (ref 12.0–15.0)
Lymphocytes Relative: 25 % (ref 12–46)
Lymphs Abs: 1.9 10*3/uL (ref 0.7–4.0)
MCH: 30.7 pg (ref 26.0–34.0)
MCHC: 32.2 g/dL (ref 30.0–36.0)
MCV: 95.3 fL (ref 78.0–100.0)
Monocytes Absolute: 0.4 10*3/uL (ref 0.1–1.0)
Monocytes Relative: 5 % (ref 3–12)
Neutro Abs: 5.2 10*3/uL (ref 1.7–7.7)
Neutrophils Relative %: 69 % (ref 43–77)
Platelets: 254 10*3/uL (ref 150–400)
RBC: 3.65 MIL/uL — ABNORMAL LOW (ref 3.87–5.11)
RDW: 12.8 % (ref 11.5–15.5)
WBC: 7.5 10*3/uL (ref 4.0–10.5)

## 2012-03-26 LAB — URINALYSIS, ROUTINE W REFLEX MICROSCOPIC
Bilirubin Urine: NEGATIVE
Glucose, UA: 250 mg/dL — AB
Hgb urine dipstick: NEGATIVE
Ketones, ur: NEGATIVE mg/dL
Leukocytes, UA: NEGATIVE
Nitrite: NEGATIVE
Protein, ur: NEGATIVE mg/dL
Specific Gravity, Urine: 1.014 (ref 1.005–1.030)
Urobilinogen, UA: 0.2 mg/dL (ref 0.0–1.0)
pH: 5.5 (ref 5.0–8.0)

## 2012-03-26 MED ORDER — SODIUM CHLORIDE 0.9 % IV BOLUS (SEPSIS)
1000.0000 mL | Freq: Once | INTRAVENOUS | Status: AC
  Administered 2012-03-26: 1000 mL via INTRAVENOUS

## 2012-03-26 NOTE — ED Notes (Signed)
Pt states past 3 days has had a worsened cold, chills/sweating throughout day night, vomiting x 2, pt states has had cold x 3 weeks but has worsened over last 3 days.

## 2012-03-26 NOTE — ED Provider Notes (Signed)
History     CSN: [id number]  Arrival date & time 03/26/12  1333   First MD Initiated Contact with Patient 03/26/12 1458      Chief Complaint  Patient presents with  . URI  . Chills  . Emesis    (Consider location/radiation/quality/duration/timing/severity/associated sxs/prior treatment) HPI Comments: 72 year old female with multiple medical problems including diabetes, hypothyroidism, anxiety who presents with multiple minor complaints including increased diaphoresis, occasional morning cough, drainage from her nasal passages and postnasal drip with a sore throat, the feeling of generalized weakness and "not feeling well". The patient is unable to be more specific with these complaints. She has followed up very closely by Dr. Virgina Jock last seen him 4 weeks ago. She states that she follows with him very closely every 6 weeks because of her "many medical problems". The reason that the patient came in today was because she had increased sweating during the day which is usually at night. She has noted several hypoglycemic episodes over the last month dropping as low as 50.  She denies headache, she admits to chronic blurred vision, she has had subjective high and low grade fevers but has no dysuria. She has no diarrhea but does endorse constipation for which she has taken multiple medications including laxatives, stool softeners and even lactulose but feels that she is not getting relief. She last used an enema with mild relief.  Patient is a 72 y.o. female presenting with URI and vomiting. The history is provided by the patient and a relative.  URI Emesis Associated symptoms: URI     Past Medical History  Diagnosis Date  . DVT (deep venous thrombosis)   . Asthma   . Hypothyroidism   . Anxiety   . Depression   . Sleep apnea     cpap disontinued  . Stroke     tia and stroke. Weakness Rt hand  . H/O hiatal hernia   . Arthritis   . Anemia   . Complication of anesthesia     hard to wake  up after anesthesia  . LVH (left ventricular hypertrophy) due to hypertensive disease     enlarged heart, mild LVH, EF 55% by echo; no ischemia by stress 01/2010 echo [facility])  . 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 Hyperlipidemia   . Diabetes mellitus type 2, insulin dependent   . Hypertension associated with diabetes   . Sleep apnea     Past Surgical History  Procedure Laterality Date  . Abdominal hysterectomy    . Cholecystectomy    . Back surgery      steroid inj  . Tonsillectomy    . Appendectomy    . Esophagogastroduodenoscopy  08/09/2011    Procedure: ESOPHAGOGASTRODUODENOSCOPY (EGD);  Surgeon: Jeryl Columbia, MD;  Location: [facility];  Service: Endoscopy;  Laterality: N/A;  . Hot hemostasis  08/09/2011    Procedure: HOT HEMOSTASIS (ARGON PLASMA COAGULATION/BICAP);  Surgeon: Jeryl Columbia, MD;  Location: [facility];  Service: Endoscopy;  Laterality: N/A;  . Joint replacement      both knees  . Trigger finger release  11/22/2011    Procedure: RELEASE TRIGGER FINGER/A-1 PULLEY;  Surgeon: Meredith Pel, MD;  Location: [place];  Service: Orthopedics;  Laterality: Left;  Left trigger thumb release    History reviewed. No pertinent family history.  History  Substance Use Topics  . Smoking status: Never Smoker   .  Smokeless tobacco: Never Used  . Alcohol Use: No    OB History   Grav Para Term Preterm Abortions TAB SAB Ect Mult Living                  Review of Systems  Gastrointestinal: Positive for vomiting.  All other systems reviewed and are negative.    Allergies  Iodine; Metoclopramide hcl; Sulfa antibiotics; Iohexol; Lactose intolerance (gi); and Lansoprazole  Home Medications   Current Outpatient Rx  Name  Route  Sig  Dispense  Refill  . Ascorbic Acid (VITAMIN C) 100 MG tablet   Oral   Take 100 mg by mouth daily.         . ciprofloxacin (CIPRO) 500 MG tablet   Oral    Take 500 mg by mouth 2 (two) times daily.         . clonazePAM (KLONOPIN) 0.5 MG tablet   Oral   Take 0.5 mg by mouth 2 (two) times daily. 2 tablets in the am and 1 in the pm         . clopidogrel (PLAVIX) 75 MG tablet   Oral   Take 75 mg by mouth daily.         Marland Kitchen docusate sodium (COLACE) 100 MG capsule   Oral   Take 100 mg by mouth 2 (two) times daily.         Marland Kitchen esomeprazole (NEXIUM) 40 MG capsule   Oral   Take 40 mg by mouth daily before breakfast.         . fluticasone (FLONASE) 50 MCG/ACT nasal spray   Nasal   Place 2 sprays into the nose daily.         . furosemide (LASIX) 20 MG tablet   Oral   Take 20 mg by mouth daily as needed. For increased fluid         . glipiZIDE (GLUCOTROL) 10 MG tablet   Oral   Take 20 mg by mouth 2 (two) times daily before a meal.         . hydrochlorothiazide (MICROZIDE) 12.5 MG capsule   Oral   Take 12.5 mg by mouth daily as needed. For fluid retention         . hyoscyamine (LEVBID) 0.375 MG 12 hr tablet   Oral   Take 0.375 mg by mouth every 12 (twelve) hours as needed for cramping.         . insulin NPH (HUMULIN N,NOVOLIN N) 100 UNIT/ML injection   Subcutaneous   Inject 10-20 Units into the skin 2 (two) times daily before a meal. 20 units qam 10 units qpm         . insulin regular (NOVOLIN R,HUMULIN R) 100 units/mL injection   Subcutaneous   Inject 20-30 Units into the skin 3 (three) times daily before meals. 30 units am 20 units at lunch and 20 units pm         . levothyroxine (SYNTHROID, LEVOTHROID) 100 MCG tablet   Oral   Take 100 mcg by mouth daily.         Marland Kitchen lidocaine (LIDODERM) 5 %   Transdermal   Place 1 patch onto the skin daily.         . meclizine (ANTIVERT) 12.5 MG tablet   Oral   Take 12.5 mg by mouth 3 (three) times daily as needed. dizziness         . nortriptyline (PAMELOR) 25 MG capsule   Oral  Take 50 mg by mouth at bedtime. Takes along with clonazepam         .  ondansetron (ZOFRAN ODT) 4 MG disintegrating tablet      4mg  ODT q4 hours prn nausea/vomit   8 tablet   0   . OVER THE COUNTER MEDICATION      2 tablets. Circulation and vein support pro caps labs         . oxyCODONE-acetaminophen (PERCOCET) 5-325 MG per tablet   Oral   Take 2 tablets by mouth every 4 (four) hours as needed for pain.   10 tablet   0   . polyethylene glycol (MIRALAX / GLYCOLAX) packet   Oral   Take 17 g by mouth daily as needed. constipation         . potassium chloride SA (K-DUR,KLOR-CON) 20 MEQ tablet   Oral   Take 20 mEq by mouth daily.         . Probiotic Product (PROBIOTIC PO)   Oral   Take 1 capsule by mouth daily as needed.          . rosuvastatin (CRESTOR) 20 MG tablet   Oral   Take 20 mg by mouth daily.         . sucralfate (CARAFATE) 1 G tablet   Oral   Take 1 g by mouth 2 (two) times daily.         Marland Kitchen terconazole (TERAZOL 7) 0.4 % vaginal cream   Vaginal   Place 1 applicator vaginally daily as needed. Yeast infection         . traMADol (ULTRAM) 50 MG tablet   Oral   Take 50 mg by mouth every 6 (six) hours as needed. Pain         . valsartan (DIOVAN) 160 MG tablet   Oral   Take 160 mg by mouth daily.           BP 135/49  Pulse 63  Temp(Src) 98 F (36.7 C) (Oral)  Resp 20  SpO2 97%  Physical Exam  Nursing note and vitals reviewed. Constitutional: She appears well-developed and well-nourished. No distress.  No acute distress  HENT:  Head: Normocephalic and atraumatic.  Mouth/Throat: Oropharynx is clear and moist. No oropharyngeal exudate.  Mucous membranes are moist, nasal passages are clear  Eyes: Conjunctivae and EOM are normal. Pupils are equal, round, and reactive to light. Right eye exhibits no discharge. Left eye exhibits no discharge. No scleral icterus.  Neck: Normal range of motion. Neck supple. No JVD present. No thyromegaly present.  No lymphadenopathy in the anterior posterior cervical chains or  along the clavicles  Cardiovascular: Normal rate, regular rhythm, normal heart sounds and intact distal pulses.  Exam reveals no gallop and no friction rub.   No murmur heard. Pulmonary/Chest: Effort normal and breath sounds normal. No respiratory distress. She has no wheezes. She has no rales.  Abdominal: Soft. Bowel sounds are normal. She exhibits no distension and no mass. There is no tenderness.  Morbidly obese, left lower quadrant tenderness without guarding or obvious masses, no peritoneal signs, no pain at McBurney's point  Musculoskeletal: Normal range of motion. She exhibits no edema and no tenderness.  Lymphadenopathy:    She has no cervical adenopathy.  Neurological: She is alert. Coordination normal.  When the patient with her and she has a mild asterixis right greater than left, falls can is without difficulty, speech is clear, movements or coordinated, no obvious extremity or truncal ataxia  Skin: Skin is warm and dry. No rash noted. No erythema.  Psychiatric: She has a normal mood and affect. Her behavior is normal.    ED Course  Procedures (including critical care time)  Labs Reviewed  COMPREHENSIVE METABOLIC PANEL - Abnormal; Notable for the following:    Glucose, Bld 222 (*)    Albumin 3.3 (*)    GFR calc non Af Amer 60 (*)    GFR calc Af Amer 69 (*)    All other components within normal limits  CBC WITH DIFFERENTIAL - Abnormal; Notable for the following:    RBC 3.65 (*)    Hemoglobin 11.2 (*)    HCT 34.8 (*)    All other components within normal limits  URINALYSIS, ROUTINE W REFLEX MICROSCOPIC - Abnormal; Notable for the following:    Glucose, UA 250 (*)    All other components within normal limits   No results found.   1. Diaphoresis   2. Generalized weakness       MDM  The patient has nonspecific symptoms, will need evaluation with labs to rule out significant anemia or elevation in her blood counts, electrolytes, liver function, urinalysis. She  requests hydration and she feels that she is dehydrated. There are no acute findings on exam to suggest a definite etiology the patient's symptoms which appear to be subacute or chronic.  ED ECG REPORT  I personally interpreted this EKG   Date: 03/26/2012   Rate: 62  Rhythm: normal sinus rhythm  QRS Axis: normal  Intervals: PR prolonged  ST/T Wave abnormalities: normal  Conduction Disutrbances:first-degree A-V block   Narrative Interpretation:   Old EKG Reviewed: unchanged   Results show that the patient has a normal metabolic panel, slight hyperglycemia, no significant anemia or leukocytosis and a urinalysis which is hydrated, without ketones or infection. The patient appears stable for discharge at this time. She has been encouraged to followup closely with her doctor within 2 days. Suspect some of her symptoms may be coming from polypharmacy as the patient is on many many medications.      Johnna Acosta, MD 03/26/12 [phone]

## 2012-04-07 DIAGNOSIS — G459 Transient cerebral ischemic attack, unspecified: Secondary | ICD-10-CM

## 2012-04-07 HISTORY — DX: Transient cerebral ischemic attack, unspecified: G45.9

## 2012-04-19 ENCOUNTER — Encounter (HOSPITAL_COMMUNITY): Payer: Self-pay | Admitting: Emergency Medicine

## 2012-04-19 ENCOUNTER — Observation Stay (HOSPITAL_COMMUNITY)
Admission: EM | Admit: 2012-04-19 | Discharge: 2012-04-20 | Disposition: A | Discharge status: Home / Self Care | Payer: Medicare HMO | Attending: Internal Medicine | Admitting: Internal Medicine

## 2012-04-19 ENCOUNTER — Observation Stay (HOSPITAL_COMMUNITY): Payer: Medicare HMO

## 2012-04-19 ENCOUNTER — Emergency Department (HOSPITAL_COMMUNITY): Payer: Medicare HMO

## 2012-04-19 DIAGNOSIS — Z794 Long term (current) use of insulin: Secondary | ICD-10-CM | POA: Insufficient documentation

## 2012-04-19 DIAGNOSIS — I10 Essential (primary) hypertension: Secondary | ICD-10-CM | POA: Insufficient documentation

## 2012-04-19 DIAGNOSIS — R4789 Other speech disturbances: Secondary | ICD-10-CM | POA: Insufficient documentation

## 2012-04-19 DIAGNOSIS — G459 Transient cerebral ischemic attack, unspecified: Secondary | ICD-10-CM

## 2012-04-19 DIAGNOSIS — R29810 Facial weakness: Principal | ICD-10-CM | POA: Insufficient documentation

## 2012-04-19 DIAGNOSIS — I672 Cerebral atherosclerosis: Secondary | ICD-10-CM | POA: Insufficient documentation

## 2012-04-19 DIAGNOSIS — I6789 Other cerebrovascular disease: Secondary | ICD-10-CM | POA: Insufficient documentation

## 2012-04-19 DIAGNOSIS — E119 Type 2 diabetes mellitus without complications: Secondary | ICD-10-CM | POA: Insufficient documentation

## 2012-04-19 HISTORY — DX: Unspecified glaucoma: H40.9

## 2012-04-19 HISTORY — DX: Unspecified cataract: H26.9

## 2012-04-19 LAB — POCT I-STAT, CHEM 8
BUN: 14 mg/dL (ref 6–23)
Calcium, Ion: 1.05 mmol/L — ABNORMAL LOW (ref 1.13–1.30)
Chloride: 102 mEq/L (ref 96–112)
Creatinine, Ser: 1 mg/dL (ref 0.50–1.10)
Glucose, Bld: 273 mg/dL — ABNORMAL HIGH (ref 70–99)
HCT: 34 % — ABNORMAL LOW (ref 36.0–46.0)
Hemoglobin: 11.6 g/dL — ABNORMAL LOW (ref 12.0–15.0)
Potassium: 3.7 mEq/L (ref 3.5–5.1)
Sodium: 137 mEq/L (ref 135–145)
TCO2: 27 mmol/L (ref 0–100)

## 2012-04-19 LAB — COMPREHENSIVE METABOLIC PANEL
ALT: 10 U/L (ref 0–35)
AST: 15 U/L (ref 0–37)
Albumin: 3.4 g/dL — ABNORMAL LOW (ref 3.5–5.2)
Alkaline Phosphatase: 73 U/L (ref 39–117)
BUN: 15 mg/dL (ref 6–23)
CO2: 25 mEq/L (ref 19–32)
Calcium: 8.9 mg/dL (ref 8.4–10.5)
Chloride: 100 mEq/L (ref 96–112)
Creatinine, Ser: 1.01 mg/dL (ref 0.50–1.10)
GFR calc Af Amer: 62 mL/min — ABNORMAL LOW (ref >90)
GFR calc non Af Amer: 54 mL/min — ABNORMAL LOW (ref >90)
Glucose, Bld: 275 mg/dL — ABNORMAL HIGH (ref 70–99)
Potassium: 4 mEq/L (ref 3.5–5.1)
Sodium: 138 mEq/L (ref 135–145)
Total Bilirubin: 0.5 mg/dL (ref 0.3–1.2)
Total Protein: 6 g/dL (ref 6.0–8.3)

## 2012-04-19 LAB — DIFFERENTIAL
Basophils Absolute: 0.1 10*3/uL (ref 0.0–0.1)
Basophils Relative: 1 % (ref 0–1)
Eosinophils Absolute: 0.1 10*3/uL (ref 0.0–0.7)
Eosinophils Relative: 2 % (ref 0–5)
Lymphocytes Relative: 26 % (ref 12–46)
Lymphs Abs: 1.9 10*3/uL (ref 0.7–4.0)
Monocytes Absolute: 0.4 10*3/uL (ref 0.1–1.0)
Monocytes Relative: 5 % (ref 3–12)
Neutro Abs: 4.9 10*3/uL (ref 1.7–7.7)
Neutrophils Relative %: 67 % (ref 43–77)

## 2012-04-19 LAB — CBC
HCT: 33.6 % — ABNORMAL LOW (ref 36.0–46.0)
Hemoglobin: 11.5 g/dL — ABNORMAL LOW (ref 12.0–15.0)
MCH: 31.3 pg (ref 26.0–34.0)
MCHC: 34.2 g/dL (ref 30.0–36.0)
MCV: 91.6 fL (ref 78.0–100.0)
Platelets: 218 10*3/uL (ref 150–400)
RBC: 3.67 MIL/uL — ABNORMAL LOW (ref 3.87–5.11)
RDW: 12.6 % (ref 11.5–15.5)
WBC: 7.3 10*3/uL (ref 4.0–10.5)

## 2012-04-19 LAB — PROTIME-INR
INR: 0.98 (ref 0.00–1.49)
Prothrombin Time: 12.9 seconds (ref 11.6–15.2)

## 2012-04-19 LAB — POCT I-STAT TROPONIN I: Troponin i, poc: 0 ng/mL (ref 0.00–0.08)

## 2012-04-19 LAB — TROPONIN I: Troponin I: <0.3 ng/mL (ref <0.30)

## 2012-04-19 LAB — GLUCOSE, CAPILLARY: Glucose-Capillary: 241 mg/dL — ABNORMAL HIGH (ref 70–99)

## 2012-04-19 LAB — APTT: aPTT: 29 seconds (ref 24–37)

## 2012-04-19 MED ORDER — IRBESARTAN 150 MG PO TABS
150.0000 mg | ORAL_TABLET | Freq: Every day | ORAL | Status: DC
  Administered 2012-04-20: 150 mg via ORAL
  Filled 2012-04-19 (×2): qty 1

## 2012-04-19 MED ORDER — HYOSCYAMINE SULFATE ER 0.375 MG PO TB12
0.3750 mg | ORAL_TABLET | Freq: Every day | ORAL | Status: DC
  Administered 2012-04-20: 0.375 mg via ORAL
  Filled 2012-04-19 (×2): qty 1

## 2012-04-19 MED ORDER — SODIUM CHLORIDE 0.9 % IV SOLN: 250.0000 mL | INTRAVENOUS | Status: DC | PRN

## 2012-04-19 MED ORDER — INSULIN NPH (HUMAN) (ISOPHANE) 100 UNIT/ML ~~LOC~~ SUSP: 15.0000 [IU] | Freq: Every day | SUBCUTANEOUS | Status: DC

## 2012-04-19 MED ORDER — BUSPIRONE HCL 15 MG PO TABS
7.5000 mg | ORAL_TABLET | Freq: Two times a day (BID) | ORAL | Status: DC
  Administered 2012-04-19 – 2012-04-20 (×2): 7.5 mg via ORAL
  Filled 2012-04-19 (×3): qty 1

## 2012-04-19 MED ORDER — ONDANSETRON 4 MG PO TBDP: 4.0000 mg | ORAL_TABLET | Freq: Three times a day (TID) | ORAL | Status: DC | PRN

## 2012-04-19 MED ORDER — LEVOTHYROXINE SODIUM 100 MCG PO TABS
100.0000 ug | ORAL_TABLET | Freq: Every day | ORAL | Status: DC
  Administered 2012-04-20: 100 ug via ORAL
  Filled 2012-04-19 (×2): qty 1

## 2012-04-19 MED ORDER — INSULIN ASPART 100 UNIT/ML ~~LOC~~ SOLN
0.0000 [IU] | Freq: Three times a day (TID) | SUBCUTANEOUS | Status: DC
  Administered 2012-04-19 – 2012-04-20 (×2): 5 [IU] via SUBCUTANEOUS
  Administered 2012-04-20: 8 [IU] via SUBCUTANEOUS
  Filled 2012-04-19: qty 1

## 2012-04-19 MED ORDER — INSULIN NPH (HUMAN) (ISOPHANE) 100 UNIT/ML ~~LOC~~ SUSP
15.0000 [IU] | Freq: Every day | SUBCUTANEOUS | Status: DC
  Administered 2012-04-20: 15 [IU] via SUBCUTANEOUS
  Filled 2012-04-19: qty 10

## 2012-04-19 MED ORDER — FUROSEMIDE 20 MG PO TABS
20.0000 mg | ORAL_TABLET | Freq: Every day | ORAL | Status: DC | PRN
  Filled 2012-04-19: qty 1

## 2012-04-19 MED ORDER — CLOPIDOGREL BISULFATE 75 MG PO TABS
75.0000 mg | ORAL_TABLET | Freq: Every day | ORAL | Status: DC
  Administered 2012-04-20: 75 mg via ORAL
  Filled 2012-04-19: qty 1

## 2012-04-19 MED ORDER — INSULIN ASPART 100 UNIT/ML ~~LOC~~ SOLN
0.0000 [IU] | Freq: Every day | SUBCUTANEOUS | Status: DC
  Administered 2012-04-19: 2 [IU] via SUBCUTANEOUS

## 2012-04-19 MED ORDER — ENOXAPARIN SODIUM 40 MG/0.4ML ~~LOC~~ SOLN
40.0000 mg | SUBCUTANEOUS | Status: DC
  Administered 2012-04-20: 40 mg via SUBCUTANEOUS
  Filled 2012-04-19 (×2): qty 0.4

## 2012-04-19 MED ORDER — FLUTICASONE PROPIONATE 50 MCG/ACT NA SUSP
2.0000 | Freq: Every day | NASAL | Status: DC | PRN
  Filled 2012-04-19: qty 16

## 2012-04-19 MED ORDER — PANTOPRAZOLE SODIUM 40 MG PO TBEC
40.0000 mg | DELAYED_RELEASE_TABLET | Freq: Every day | ORAL | Status: DC
  Administered 2012-04-20: 40 mg via ORAL
  Filled 2012-04-19: qty 1

## 2012-04-19 MED ORDER — DOCUSATE SODIUM 100 MG PO CAPS
100.0000 mg | ORAL_CAPSULE | Freq: Two times a day (BID) | ORAL | Status: DC | PRN
  Administered 2012-04-20: 100 mg via ORAL
  Filled 2012-04-19: qty 1

## 2012-04-19 MED ORDER — LIDOCAINE 5 % EX PTCH
2.0000 | MEDICATED_PATCH | CUTANEOUS | Status: DC
  Filled 2012-04-19 (×2): qty 2

## 2012-04-19 MED ORDER — POLYETHYLENE GLYCOL 3350 17 G PO PACK
17.0000 g | PACK | Freq: Every day | ORAL | Status: DC | PRN
  Filled 2012-04-19: qty 1

## 2012-04-19 MED ORDER — SODIUM CHLORIDE 0.9 % IJ SOLN: 3.0000 mL | INTRAMUSCULAR | Status: DC | PRN

## 2012-04-19 MED ORDER — ATORVASTATIN CALCIUM 20 MG PO TABS
20.0000 mg | ORAL_TABLET | Freq: Every day | ORAL | Status: DC
  Filled 2012-04-19: qty 1

## 2012-04-19 MED ORDER — SODIUM CHLORIDE 0.9 % IJ SOLN
3.0000 mL | Freq: Two times a day (BID) | INTRAMUSCULAR | Status: DC
  Administered 2012-04-20: 3 mL via INTRAVENOUS

## 2012-04-19 MED ORDER — CLONAZEPAM 0.5 MG PO TABS
0.5000 mg | ORAL_TABLET | Freq: Two times a day (BID) | ORAL | Status: DC
  Administered 2012-04-19 – 2012-04-20 (×2): 0.5 mg via ORAL
  Filled 2012-04-19 (×2): qty 1

## 2012-04-19 MED ORDER — SODIUM CHLORIDE 0.9 % IV SOLN: INTRAVENOUS | Status: DC

## 2012-04-19 MED ORDER — SUCRALFATE 1 G PO TABS
1.0000 g | ORAL_TABLET | Freq: Two times a day (BID) | ORAL | Status: DC
  Administered 2012-04-20: 1 g via ORAL
  Filled 2012-04-19 (×3): qty 1

## 2012-04-19 MED ORDER — GLIPIZIDE 10 MG PO TABS
20.0000 mg | ORAL_TABLET | Freq: Two times a day (BID) | ORAL | Status: DC
  Administered 2012-04-19: 10 mg via ORAL
  Administered 2012-04-20: 20 mg via ORAL
  Filled 2012-04-19 (×3): qty 2

## 2012-04-19 MED ORDER — LACTULOSE 10 GM/15ML PO SOLN
10.0000 g | Freq: Every day | ORAL | Status: DC | PRN
  Filled 2012-04-19: qty 15

## 2012-04-19 MED ORDER — POTASSIUM CHLORIDE CRYS ER 20 MEQ PO TBCR
20.0000 meq | EXTENDED_RELEASE_TABLET | Freq: Every day | ORAL | Status: DC
  Administered 2012-04-20: 20 meq via ORAL
  Filled 2012-04-19 (×2): qty 1

## 2012-04-19 MED ORDER — TRAMADOL-ACETAMINOPHEN 37.5-325 MG PO TABS
1.0000 | ORAL_TABLET | Freq: Four times a day (QID) | ORAL | Status: DC | PRN
  Administered 2012-04-19 – 2012-04-20 (×2): 1 via ORAL
  Filled 2012-04-19 (×3): qty 1

## 2012-04-19 NOTE — ED Notes (Signed)
Patient states she had a headache yesterday but didn't think it was anything. This morning she was at physical therapy staff and husband noticed a right sided facial droop and unsteady gait. They called her General Practitioner who told them to come straight here.

## 2012-04-19 NOTE — H&P (Signed)
Tara Jackson is an 72 y.o. female.   PCP:   [name], MD   Chief Complaint:  Slurred speech and facial Droop  HPI: 6 F with MMP who was at rehab for cervical stenosis; she was noted at 1010 to have sudden onset of R facial droop. They called here and we referred her to the ED for eval and Rx.  Her husband drove her to the hospital, arriving at 1125. She presented as a TIA, but upon doing the NIHSS, the nurse noted L facial droop, sensory loss on R and extinction. EDP exam was at 1148. Code stroke was activated at 1156. Code team arrived at 1203. Labs were drawn and pt was taken to CT which was read as no acute abnormality. NIHSS repeated by stroke team with score of 2: Facial droop has resolved, sensory loss noted on R, and RUQ field cut bilaterally. Dr. Doy Mince did not give tPA since the only questionable new symptom is sensory loss and pt has improved.I was called to admit for stroke work-up. I went ahead and ordered the CVA W/Up in ED and Carotid Duplex (Doppler)  Preliminary findings: Bilateral: No evidence of hemodynamically significant internal carotid artery stenosis. Vertebral artery flow is antegrade 2D ECHO - Left ventricle: The cavity size was normal. Systolic function was normal. The estimated ejection fraction was in the range of 55% to 60%. Wall motion was normal; there were no regional wall motion abnormalities. Left ventricular diastolic function parameters were normal.  MRI No acute infarct.  Mild small vessel disease type changes.  Nonspecific 1.1 cm lesion in the right occipital bone posterior to the junction of the right transverse sinus and sigmoid sinus is unchanged. This has well-defined borders and is most likely benign possibly an atypical arachnoid granulation. MRA Exam is slightly motion degraded. Branch vessel atherosclerotic type changes as noted.    I just evaluated her and went over the results - we discussed going home or Observe overnight.  She is having some min  numbness and mastication issues but no swallowing issues.  Will observe        Past Medical History:  Past Medical History  Diagnosis Date  . DVT (deep venous thrombosis)   . Asthma   . Hypothyroidism   . Anxiety   . Depression   . Sleep apnea     cpap disontinued  . Stroke     tia and stroke. Weakness Rt hand  . H/O hiatal hernia   . Arthritis   . Anemia   . Complication of anesthesia     hard to wake up after anesthesia  . LVH (left ventricular hypertrophy) due to hypertensive disease     enlarged heart, mild LVH, EF 55% by echo; no ischemia by stress 01/2010 echo [facility])  . 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 Hyperlipidemia   . Diabetes mellitus type 2, insulin dependent   . Hypertension associated with diabetes   . Sleep apnea   . Glaucoma   . 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  Glaucoma - remission.  02/2012 Cath - Non Ob CAD  Dr Perley Jain EGDs [facility] Endo 08/09/11 =  Small HH, Mild gastritis, small duodenal polyp not resectable, o/w nml  OK for Pepcid on top of nexium. On prior EGD's Persistent adenomatous small duodenal polyp S/p Bx = Pathology - Benign Tubular Adenoma, Small hiatal hernia with a widely patent ring, atrophic gastritis   Past Surgical History  Procedure Laterality Date  . Abdominal hysterectomy    . Cholecystectomy    . Back surgery      steroid inj  . Tonsillectomy    . Appendectomy    . Esophagogastroduodenoscopy   08/09/2011    Procedure: ESOPHAGOGASTRODUODENOSCOPY (EGD);  Surgeon: Jeryl Columbia, MD;  Location: [facility];  Service: Endoscopy;  Laterality: N/A;  . Hot hemostasis  08/09/2011    Procedure: HOT HEMOSTASIS (ARGON PLASMA COAGULATION/BICAP);  Surgeon: Jeryl Columbia, MD;  Location: [facility];  Service: Endoscopy;  Laterality: N/A;  . Joint replacement      both knees  . Trigger finger release  11/22/2011    Procedure: RELEASE TRIGGER FINGER/A-1 PULLEY;  Surgeon: Meredith Pel, MD;  Location: [facility];  Service: Orthopedics;  Laterality: Left;  Left trigger thumb release  . Eye surgery      L TKR Lap Chole TAH    Allergies:   Allergies  Allergen Reactions  . Iodine Other (See Comments)    Makes unconscious  . Metoclopramide Hcl Other (See Comments)    suicidal  . Sulfa Antibiotics Hives  . Iohexol      Code: SOB, Desc: cardiac arrest w/ iv contrast, has never used 13 hr prep//alice calhoun, Onset Date: [id number]   . Lactose Intolerance (Gi) Diarrhea  . Lansoprazole Nausea And Vomiting     Medications: Prior to Admission medications   Medication Sig Start Date End Date Taking? Authorizing Provider  Ascorbic Acid (VITAMIN C) 100 MG tablet Take 100 mg by mouth daily.   Yes Historical Provider, MD  busPIRone (BUSPAR) 7.5 MG tablet Take 7.5 mg by mouth 2 (two) times daily.   Yes Historical Provider, MD  clonazePAM (KLONOPIN) 0.5 MG tablet Take 0.5 mg by mouth 2 (two) times daily. 2 tablets in the am and 1 in the pm   Yes Historical Provider, MD  clopidogrel (PLAVIX) 75 MG tablet Take 75 mg by mouth daily.   Yes Historical Provider, MD  docusate sodium (COLACE) 100 MG capsule Take 100 mg by mouth 2 (two) times daily as needed for constipation.    Yes Historical Provider, MD  esomeprazole (NEXIUM) 40 MG capsule Take 40 mg by mouth daily before breakfast.   Yes Historical Provider, MD  fluticasone (FLONASE) 50 MCG/ACT nasal spray Place 2 sprays into the nose daily as needed for  rhinitis or allergies.    Yes Historical Provider, MD  furosemide (LASIX) 20 MG tablet Take 20 mg by mouth daily as needed. For increased fluid   Yes Historical Provider, MD  glipiZIDE (GLUCOTROL) 10 MG tablet Take 20 mg by mouth 2 (two) times daily before a meal.   Yes Historical Provider, MD  hydrochlorothiazide (MICROZIDE) 12.5 MG capsule Take 12.5 mg by mouth daily as needed. For fluid retention   Yes Historical Provider, MD  hyoscyamine (LEVBID) 0.375 MG 12 hr tablet Take 0.375 mg by  mouth daily.    Yes Historical Provider, MD  insulin NPH (HUMULIN N,NOVOLIN N) 100 UNIT/ML injection Inject 10-20 Units into the skin daily before breakfast. 20 units morning   Yes Historical Provider, MD  insulin regular (NOVOLIN R,HUMULIN R) 100 units/mL injection Inject 20-30 Units into the skin 3 (three) times daily before meals. 30 units am 20 units at lunch and 20 units pm   Yes Historical Provider, MD  lactulose (CHRONULAC) 10 GM/15ML solution Take 10 g by mouth daily as needed (with miralax).   Yes Historical Provider, MD  levothyroxine (SYNTHROID, LEVOTHROID) 100 MCG tablet Take 100 mcg by mouth daily.   Yes Historical Provider, MD  lidocaine (LIDODERM) 5 % Place 2 patches onto the skin daily. Wear patch on 12 hrs = OFF 12 hrs.  **BRAND NAME ONLY**   Yes Historical Provider, MD  LORazepam (ATIVAN) 1 MG tablet Take 1 mg by mouth every 8 (eight) hours. For anxiety   Yes Historical Provider, MD  meclizine (ANTIVERT) 12.5 MG tablet Take 12.5 mg by mouth 3 (three) times daily as needed. dizziness   Yes Historical Provider, MD  ondansetron (ZOFRAN-ODT) 4 MG disintegrating tablet Take 4 mg by mouth every 8 (eight) hours as needed for nausea.   Yes Historical Provider, MD  polyethylene glycol (MIRALAX / GLYCOLAX) packet Take 17 g by mouth daily as needed (with lactulose). constipation   Yes Historical Provider, MD  potassium chloride SA (K-DUR,KLOR-CON) 20 MEQ tablet Take 20 mEq by mouth daily.   Yes Historical  Provider, MD  Probiotic Product (PROBIOTIC PO) Take 1 capsule by mouth daily.    Yes Historical Provider, MD  rosuvastatin (CRESTOR) 20 MG tablet Take 20 mg by mouth daily.   Yes Historical Provider, MD  sucralfate (CARAFATE) 1 G tablet Take 1 g by mouth 2 (two) times daily.   Yes Historical Provider, MD  terconazole (TERAZOL 7) 0.4 % vaginal cream Place 1 applicator vaginally daily as needed. Yeast infection   Yes Historical Provider, MD  traMADol-acetaminophen (ULTRACET) 37.5-325 MG per tablet Take 1 tablet by mouth every 6 (six) hours as needed for pain.   Yes Historical Provider, MD  valsartan (DIOVAN) 160 MG tablet Take 160 mg by mouth daily.   Yes Historical Provider, MD      (Not in a hospital admission)   Social History:  reports that she has never smoked. She has never used smokeless tobacco. She reports that she does not drink alcohol or use illicit drugs. Married 2 Children 2 [place]   Family History: History reviewed. No pertinent family history.  Father: Deceased, Pancreatic Mother: Deceased, DM2, CAD,Breast CA, Brain Tumor 6 Siblings  Review of Systems:  Review of Systems - Full ROS obtained. No CP or SOB. + Anxiety. + GERD. See HPI.  Physical Exam:  Blood pressure 137/123, pulse 66, temperature 97.7 F (36.5 C), temperature source Oral, resp. rate 19, SpO2 97.00%. Filed Vitals:   04/19/12 1330 04/19/12 1400 04/19/12 1401 04/19/12 1730  BP: 112/96 120/98  137/123  Pulse: 58 55  66  Temp:   97.7 F (36.5 C)   TempSrc:      Resp: 17 12  19   SpO2: 100% 100%  97%   General appearance: A and o.Looks stable Head: Normocephalic, without obvious abnormality, atraumatic.  Wearing a wig Eyes: conjunctivae/corneas clear. PERRL, EOM's intact.  Nose: Nares normal. Septum midline. Mucosa normal. No drainage or sinus tenderness. Throat: lips, mucosa, and tongue normal; teeth and gums normal OP - Tongue -  midline.  No facial Asymmetry Neck: no adenopathy, no  carotid bruit, no JVD and thyroid not enlarged, symmetric, no tenderness/mass/nodules Resp: CTA Cardio: Reg GI: soft, non-tender; bowel sounds normal; no masses,  no organomegaly. Obese Extremities: extremities normal, atraumatic, no cyanosis or edema Pulses: 2+ and symmetric Lymph nodes:  no cervical lymphadenopathy Neurologic: Alert and oriented X 3, normal strength and tone. Normal symmetric reflexes.     Labs on Admission:   Recent Labs  04/19/12 1228 04/19/12 1237  NA 138 137  K 4.0 3.7  CL 100 102  CO2 25  --   GLUCOSE 275* 273*  BUN 15 14  CREATININE 1.01 1.00  CALCIUM 8.9  --     Recent Labs  04/19/12 1228  AST 15  ALT 10  ALKPHOS 73  BILITOT 0.5  PROT 6.0  ALBUMIN 3.4*   No results found for this basename: LIPASE, AMYLASE,  in the last 72 hours  Recent Labs  04/19/12 1228 04/19/12 1237  WBC 7.3  --   NEUTROABS 4.9  --   HGB 11.5* 11.6*  HCT 33.6* 34.0*  MCV 91.6  --   PLT 218  --     Recent Labs  04/19/12 1228  TROPONINI <0.30   Lab Results  Component Value Date   INR 0.98 04/19/2012   INR 0.89 12/05/2010   INR 0.90 05/12/2009     LAB RESULT POCT:  Results for orders placed during the hospital encounter of 04/19/12  PROTIME-INR      Result Value Range   Prothrombin Time 12.9  11.6 - 15.2 seconds   INR 0.98  0.00 - 1.49  APTT      Result Value Range   aPTT 29  24 - 37 seconds  CBC      Result Value Range   WBC 7.3  4.0 - 10.5 K/uL   RBC 3.67 (*) 3.87 - 5.11 MIL/uL   Hemoglobin 11.5 (*) 12.0 - 15.0 g/dL   HCT 33.6 (*) 36.0 - 46.0 %   MCV 91.6  78.0 - 100.0 fL   MCH 31.3  26.0 - 34.0 pg   MCHC 34.2  30.0 - 36.0 g/dL   RDW 12.6  11.5 - 15.5 %   Platelets 218  150 - 400 K/uL  DIFFERENTIAL      Result Value Range   Neutrophils Relative 67  43 - 77 %   Neutro Abs 4.9  1.7 - 7.7 K/uL   Lymphocytes Relative 26  12 - 46 %   Lymphs Abs 1.9  0.7 - 4.0 K/uL   Monocytes Relative 5  3 - 12 %   Monocytes Absolute 0.4  0.1 - 1.0 K/uL    Eosinophils Relative 2  0 - 5 %   Eosinophils Absolute 0.1  0.0 - 0.7 K/uL   Basophils Relative 1  0 - 1 %   Basophils Absolute 0.1  0.0 - 0.1 K/uL  COMPREHENSIVE METABOLIC PANEL      Result Value Range   Sodium 138  135 - 145 mEq/L   Potassium 4.0  3.5 - 5.1 mEq/L   Chloride 100  96 - 112 mEq/L   CO2 25  19 - 32 mEq/L   Glucose, Bld 275 (*) 70 - 99 mg/dL   BUN 15  6 - 23 mg/dL   Creatinine, Ser 1.01  0.50 - 1.10 mg/dL   Calcium 8.9  8.4 - 10.5 mg/dL   Total Protein 6.0  6.0 - 8.3 g/dL  Albumin 3.4 (*) 3.5 - 5.2 g/dL   AST 15  0 - 37 U/L   ALT 10  0 - 35 U/L   Alkaline Phosphatase 73  39 - 117 U/L   Total Bilirubin 0.5  0.3 - 1.2 mg/dL   GFR calc non Af Amer 54 (*) >90 mL/min   GFR calc Af Amer 62 (*) >90 mL/min  TROPONIN I      Result Value Range   Troponin I <0.30  <0.30 ng/mL  POCT I-STAT, CHEM 8      Result Value Range   Sodium 137  135 - 145 mEq/L   Potassium 3.7  3.5 - 5.1 mEq/L   Chloride 102  96 - 112 mEq/L   BUN 14  6 - 23 mg/dL   Creatinine, Ser 1.00  0.50 - 1.10 mg/dL   Glucose, Bld 273 (*) 70 - 99 mg/dL   Calcium, Ion 1.05 (*) 1.13 - 1.30 mmol/L   TCO2 27  0 - 100 mmol/L   Hemoglobin 11.6 (*) 12.0 - 15.0 g/dL   HCT 34.0 (*) 36.0 - 46.0 %  POCT I-STAT TROPONIN I      Result Value Range   Troponin i, poc 0.00  0.00 - 0.08 ng/mL   Comment 3               Radiological Exams on Admission: Ct Head Wo Contrast  04/19/2012  *RADIOLOGY REPORT*  Clinical Data: Code stroke with right sided facial droop.  Diffuse headache yesterday.  CT HEAD WITHOUT CONTRAST  Technique:  Contiguous axial images were obtained from the base of the skull through the vertex without contrast.  Comparison: CT head 09/24/2010.  Findings: No evidence of acute infarct, acute hemorrhage, mass lesion, mass effect or hydrocephalus.  Minimal periventricular low attenuation.  Visualized portions of the paranasal sinuses and mastoid air cells are clear.  IMPRESSION:  1.  No acute intracranial  abnormality. These results were called by telephone on 04/19/2012 at 12:20 p.m. to Dr. Rogene Houston, who verbally acknowledged these results. 2.  Minimal chronic microvascular white matter ischemic changes.   Original Report Authenticated By: Lorin Picket, M.D.    Mr [facility] Wo Contrast  04/19/2012  *RADIOLOGY REPORT*  Clinical Data:  Dizzy with right-sided facial droop which has resolved.  Hyperlipidemia.  MRI BRAIN WITHOUT CONTRAST MRA HEAD WITHOUT CONTRAST  Technique: Multiplanar, multiecho pulse sequences of the brain and surrounding structures were obtained according to standard protocol without intravenous contrast.  Angiographic images of the head were obtained using MRA technique without contrast.  Comparison: 04/19/2012 CT.  02/16/2010 MR.  MRI HEAD  Findings:  No acute infarct.  No intracranial hemorrhage.  Mild small vessel disease type changes.  No hydrocephalus.  Nonspecific 1.1 cm lesion in the right occipital bone posterior to the junction of the right transverse sinus and sigmoid sinus is unchanged. This has well-defined borders and is most likely benign possibly an atypical arachnoid granulation. Stability can be confirmed on follow-up.  Cervical medullary junction, pineal region and orbital structures unremarkable.  Slightly small pituitary gland.  IMPRESSION: No acute infarct.  Mild small vessel disease type changes.  Nonspecific 1.1 cm lesion in the right occipital bone posterior to the junction of the right transverse sinus and sigmoid sinus is unchanged. This has well-defined borders and is most likely benign possibly an atypical arachnoid granulation.  MRA HEAD  Findings: Motion degraded exam.  Narrowing of the right anterior cerebral artery with tandem stenoses.  Otherwise anterior  circulation without medium or large size vessel significant stenosis or occlusion.  Mild middle cerebral artery branch vessel irregularity bilaterally.  Right vertebral artery is dominant.  Nonvisualization  PICAs and AICAs.  Irregularity and narrowing of the superior cerebellar artery bilaterally.  Irregularity with areas of narrowing involving portions of the posterior cerebral artery more notable on the left.  No obvious aneurysm.  IMPRESSION: Exam is slightly motion degraded.  Branch vessel atherosclerotic type changes as noted above.   Original Report Authenticated By: Genia Del, M.D.    Mr Brain Wo Contrast  04/19/2012  *RADIOLOGY REPORT*  Clinical Data:  Dizzy with right-sided facial droop which has resolved.  Hyperlipidemia.  MRI BRAIN WITHOUT CONTRAST MRA HEAD WITHOUT CONTRAST  Technique: Multiplanar, multiecho pulse sequences of the brain and surrounding structures were obtained according to standard protocol without intravenous contrast.  Angiographic images of the head were obtained using MRA technique without contrast.  Comparison: 04/19/2012 CT.  02/16/2010 MR.  MRI HEAD  Findings:  No acute infarct.  No intracranial hemorrhage.  Mild small vessel disease type changes.  No hydrocephalus.  Nonspecific 1.1 cm lesion in the right occipital bone posterior to the junction of the right transverse sinus and sigmoid sinus is unchanged. This has well-defined borders and is most likely benign possibly an atypical arachnoid granulation. Stability can be confirmed on follow-up.  Cervical medullary junction, pineal region and orbital structures unremarkable.  Slightly small pituitary gland.  IMPRESSION: No acute infarct.  Mild small vessel disease type changes.  Nonspecific 1.1 cm lesion in the right occipital bone posterior to the junction of the right transverse sinus and sigmoid sinus is unchanged. This has well-defined borders and is most likely benign possibly an atypical arachnoid granulation.  MRA HEAD  Findings: Motion degraded exam.  Narrowing of the right anterior cerebral artery with tandem stenoses.  Otherwise anterior circulation without medium or large size vessel significant stenosis or occlusion.   Mild middle cerebral artery branch vessel irregularity bilaterally.  Right vertebral artery is dominant.  Nonvisualization PICAs and AICAs.  Irregularity and narrowing of the superior cerebellar artery bilaterally.  Irregularity with areas of narrowing involving portions of the posterior cerebral artery more notable on the left.  No obvious aneurysm.  IMPRESSION: Exam is slightly motion degraded.  Branch vessel atherosclerotic type changes as noted above.   Original Report Authenticated By: Genia Del, M.D.       Orders placed during the hospital encounter of 04/19/12  . EKG 12-LEAD  . EKG 12-LEAD  . EKG 12-LEAD  . EKG 12-LEAD     Assessment/Plan Active Problems:   * No active hospital problems. *  TIA - Did not need or get tPA since the only questionable new symptom is sensory loss and pt has improved.  CCT (-). Carotid Duplex (Doppler)  Preliminary findings: Bilateral: No evidence of hemodynamically significant internal carotid artery stenosis. Vertebral artery flow is antegrade.  2D ECHO - Left ventricle: The cavity size was normal. Systolic function was normal. The estimated ejection fraction was in the range of 55% to 60%. Wall motion was normal; there were no regional wall motion abnormalities. Left ventricular diastolic function parameters were normal.  MRI No acute infarct.  Mild small vessel disease type changes.  Nonspecific 1.1 cm lesion in the right occipital bone posterior to the junction of the right transverse sinus and sigmoid sinus is unchanged. This has well-defined borders and is most likely benign possibly an atypical arachnoid granulation. MRA Exam is slightly motion degraded.  Branch vessel atherosclerotic type changes as noted.  Will Observe overnight.  She is having some min numbness and mastication issues but no swallowing issues.  Will observe.  Continue Plavix. RF Modification. Tele.  Home tomorrow.  EKG and Tele= NSR.  H/O Prior CVA/TIA    DIABETES MELLITUS, TYPE II,  CONTROLLED, W/NEURO COMPS (ICD-250.60) (ICD10-E11.49)    Novolin R 100 Unit/ml Inj Soln (Insulin regular human) .Marland KitchenMarland KitchenMarland KitchenMarland Kitchen 30units qam 20 units lunch 20 units qhs    Glucotrol 10 Mg Tabs (Glipizide) .Marland Kitchen... 2 po bid    Novolin N 100 Unit/ml Susp (Insulin isophane human) .Marland Kitchen... 20 units qam , 10 units qhs    Diovan 160 Mg Tabs (Valsartan) .Marland Kitchen... 1 po qd Hgb A1C: 8.7 in 10/2011 and 02/2012 goal is appropriate control with very few lows NPH and ISS here. Recheck A1C  HTN - BP fine    Furosemide 40 Mg Tabs (Furosemide) ..... One po daily prn    Hydrochlorothiazide 12.5 Mg Tabs (Hydrochlorothiazide) .Marland Kitchen... 1 po qd    Diovan 160 Mg Tabs (Valsartan) .Marland Kitchen... 1 po qd   DDD/OA/Spinal Stenosis/LOW BACK PAIN, CHRONIC tramadol and Lidoderm. S/P ESI's and was told she cannot have another yet Resume PT on D/C  Elevated Lipids - on Rx - Check #s.  Anxiety/Depression - On mult meds.    Buspirone Hcl 7.5 Mg Tabs (Buspirone hcl) ..... One po bid    Klonopin 0.5 Mg Tabs (Clonazepam) .Marland Kitchen..Marland Kitchen Two po qam and 1 po qhs    Nortriptyline Hcl 50 Mg Caps (Nortriptyline hcl) .Marland Kitchen... 1 po qhs   Morbid Obesity - needs weight loss  Anemia of Chronic Disease - Hbg 11.5 and fine.  OSA should be wearing her CPAP per Dr Lamonte Sakai,    Hypothryroid - on Rx,    02/2012 Cath - Non Ob CAD  Home meds where they are appropriate.   Complete Medication List: 1)  Buspirone Hcl 7.5 Mg Tabs (Buspirone hcl) .... One po bid 3)  Ondansetron Hcl 4 Mg Tabs (Ondansetron hcl) .Marland Kitchen.. 1 po every 8 hours prn 4)  Sucralfate 1 Gm Tabs (Sucralfate) .Marland Kitchen.. 1 po qid prn 5)  Metanx 3-90.314-2-35 Mg Caps (L-methylfolate-algae-b12-b6) .Marland Kitchen.. 1 po qd 6)  Flonase 50 Mcg/act Susp (Fluticasone propionate) .... 2 sprays each nostril qd  )  Venolin  .... Inhale 2 puffs as needed for weezing 9)  Capsacin  .... Apply to both feet for burning when needed 10)  Tarzol  .... Apply to affect area when needed for itching and burning 11)  Ativan  .... One tablet by mouth as  needed*do not refill** 12)  Novolin R 100  .... Injection 3 x a day or more if needed 13)  Ventolin Hfa 108 (90 Base) Mcg/act Aers (Albuterol sulfate) .... 2 puffs bid prn 14)  Antivert 25 Mg Tabs (Meclizine hcl) .... One po tid prn vertigo 15)  Methscopolamine Bromide 5 Mg Tabs (Methscopolamine bromide) .Marland Kitchen.. 1 bid 16)  Nystatin 100000 Unit/ml Susp (Nystatin) .... Use bid prn for thrush 17)  Hyoscyamine Sulfate Er 0.375 Mg Xr12h-tab (Hyoscyamine sulfate) .... One tab every 12hrs 18)  Novolin R 100 Unit/ml Inj Soln (Insulin regular human) .... 30units qam 20 units lunch 20 units qhs 19)  Glucotrol 10 Mg Tabs (Glipizide) .... 2 po bid 20)  Nexium 40 Mg Cpdr (Esomeprazole magnesium) .Marland Kitchen.. 1 po qd 21)  Klonopin 0.5 Mg Tabs (Clonazepam) .... Two po qam and 1 po qhs 22)  Nortriptyline Hcl 50 Mg Caps (Nortriptyline hcl) .Marland KitchenMarland KitchenMarland Kitchen  1 po qhs 23)  Furosemide 40 Mg Tabs (Furosemide) .... One po daily prn 24)  Terazol 3 0.8 % Crea (Terconazole) .... Use as directed 25)  Vesicare 10 Mg Tabs (Solifenacin succinate) .... One po daily 26)  Levoxyl 100 Mcg Tabs (Levothyroxine sodium) .Marland Kitchen.. 1 po qd 27)  Novolin N 100 Unit/ml Susp (Insulin isophane human) .... 20 units qam , 10 units qhs 28)  Klor-con M20 20 Meq Cr-tabs (Potassium chloride crys cr) .Marland Kitchen.. 1 po qd 29)  Crestor 10 Mg Tabs (Rosuvastatin calcium) .Marland Kitchen.. 1 po qd 30)  Plavix 75 Mg Tabs (Clopidogrel bisulfate) .Marland Kitchen.. 1 po qd 31)  Hydrochlorothiazide 12.5 Mg Tabs (Hydrochlorothiazide) .Marland Kitchen.. 1 po qd 32)  Onetouch Test Strp (Glucose blood) .... Test blood sugar up to 5 times per day 33)  Ventolin Hfa 108 (90 Base) Mcg/act Aers (Albuterol sulfate) .Marland Kitchen.. 1-2 puffs bid prn 34)  Lidoderm 5 % Ptch (Lidocaine) .... Apply 1 patch 12 hours on and 12 hours off 35)  Bd Insulin Syringe Ultrafine 30g X 1/2" 1 Ml Misc (Insulin syringe-needle u-100) .... Use up to four per day, 36)  Diovan 160 Mg Tabs (Valsartan) .Marland Kitchen.. 1 po qd 37)  Lactulose 10 Gm/19ml Soln (Lactulose) .Marland Kitchen.. 15 cc  evry 3 days prn constipation 38)  Tramadol Hcl 50 Mg Tabs (Tramadol hcl) .... One po bid prn pain    Davaughn Hillyard M 04/19/2012, 5:56 PM

## 2012-04-19 NOTE — Consult Note (Signed)
Referring Physician: Rogene Houston    Chief Complaint: right facial droop  HPI:                                                                                                                                         Tara Jackson is an 72 y.o. female who was at physical therapy for her cervical spine problems today when she was noted to have a right facial droop.  Her husband brought her to the ED for possible stroke.  While in the ED her symptoms had fully resolved. Patient continues to have right arm decreased sensation and weakness but states this is residual from her previous stroke and not new.   Date last known well: today Time last known well: 10:10 tPA Given: No: symptoms resolved  Past Medical History  Diagnosis Date  . DVT (deep venous thrombosis)   . Asthma   . Hypothyroidism   . Anxiety   . Depression   . Sleep apnea     cpap disontinued  . Stroke     tia and stroke. Weakness Rt hand  . H/O hiatal hernia   . Arthritis   . Anemia   . Complication of anesthesia     hard to wake up after anesthesia  . LVH (left ventricular hypertrophy) due to hypertensive disease     enlarged heart, mild LVH, EF 55% by echo; no ischemia by stress 01/2010 echo [facility])  . 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 Hyperlipidemia   . Diabetes mellitus type 2, insulin dependent   . Hypertension associated with diabetes   . Sleep apnea   . Glaucoma   . Cataract     Past Surgical History  Procedure Laterality Date  . Abdominal hysterectomy    . Cholecystectomy    . Back surgery      steroid inj  . Tonsillectomy    . Appendectomy    . Esophagogastroduodenoscopy  08/09/2011    Procedure: ESOPHAGOGASTRODUODENOSCOPY (EGD);  Surgeon: Jeryl Columbia, MD;  Location: [facility];  Service: Endoscopy;  Laterality: N/A;  . Hot hemostasis  08/09/2011    Procedure: HOT HEMOSTASIS (ARGON PLASMA COAGULATION/BICAP);  Surgeon: Jeryl Columbia, MD;  Location: [facility];  Service: Endoscopy;  Laterality: N/A;  . Joint replacement      both knees  . Trigger finger release  11/22/2011    Procedure: RELEASE TRIGGER FINGER/A-1 PULLEY;  Surgeon: Meredith Pel, MD;  Location: [place];  Service: Orthopedics;  Laterality: Left;  Left trigger thumb release  . Eye surgery      Family history. Mother: brain cancer, DM, HTN Father: Pancreatic cancer, DM, HTN  Social History:  reports that she has never smoked. She has never used smokeless tobacco. She reports that she does not drink alcohol or  use illicit drugs.  Allergies:  Allergies  Allergen Reactions  . Iodine Other (See Comments)    Makes unconscious  . Metoclopramide Hcl Other (See Comments)    suicidal  . Sulfa Antibiotics Hives  . Iohexol      Code: SOB, Desc: cardiac arrest w/ iv contrast, has never used 13 hr prep//alice calhoun, Onset Date: [id number]   . Lactose Intolerance (Gi)   . Lansoprazole Nausea And Vomiting    Medications:                                                                                                                           Current Facility-Administered Medications  Medication Dose Route Frequency Provider Last Rate Last Dose  . 0.9 %  sodium chloride infusion   Intravenous Continuous Mervin Kung, MD       Current Outpatient Prescriptions  Medication Sig Dispense Refill  . Ascorbic Acid (VITAMIN C) 100 MG tablet Take 100 mg by mouth daily.      . ciprofloxacin (CIPRO) 500 MG tablet Take 500 mg by mouth 2 (two) times daily.      . clonazePAM (KLONOPIN) 0.5 MG tablet Take 0.5 mg by mouth 2 (two) times daily. 2 tablets in the am and 1 in the pm      . clopidogrel (PLAVIX) 75 MG tablet Take 75 mg by mouth daily.      Marland Kitchen docusate sodium (COLACE) 100 MG capsule Take 100 mg by mouth 2 (two) times daily.      Marland Kitchen esomeprazole (NEXIUM) 40 MG capsule Take 40 mg by mouth daily before breakfast.      . fluticasone (FLONASE) 50  MCG/ACT nasal spray Place 2 sprays into the nose daily.      . furosemide (LASIX) 20 MG tablet Take 20 mg by mouth daily as needed. For increased fluid      . glipiZIDE (GLUCOTROL) 10 MG tablet Take 20 mg by mouth 2 (two) times daily before a meal.      . hydrochlorothiazide (MICROZIDE) 12.5 MG capsule Take 12.5 mg by mouth daily as needed. For fluid retention      . hyoscyamine (LEVBID) 0.375 MG 12 hr tablet Take 0.375 mg by mouth every 12 (twelve) hours as needed for cramping.      . insulin NPH (HUMULIN N,NOVOLIN N) 100 UNIT/ML injection Inject 10-20 Units into the skin 2 (two) times daily before a meal. 20 units qam 10 units qpm      . insulin regular (NOVOLIN R,HUMULIN R) 100 units/mL injection Inject 20-30 Units into the skin 3 (three) times daily before meals. 30 units am 20 units at lunch and 20 units pm      . levothyroxine (SYNTHROID, LEVOTHROID) 100 MCG tablet Take 100 mcg by mouth daily.      Marland Kitchen lidocaine (LIDODERM) 5 % Place 1 patch onto the skin daily.      . meclizine (ANTIVERT) 12.5 MG tablet Take 12.5 mg by  mouth 3 (three) times daily as needed. dizziness      . nortriptyline (PAMELOR) 25 MG capsule Take 50 mg by mouth at bedtime. Takes along with clonazepam      . ondansetron (ZOFRAN ODT) 4 MG disintegrating tablet 4mg  ODT q4 hours prn nausea/vomit  8 tablet  0  . OVER THE COUNTER MEDICATION 2 tablets. Circulation and vein support pro caps labs      . oxyCODONE-acetaminophen (PERCOCET) 5-325 MG per tablet Take 2 tablets by mouth every 4 (four) hours as needed for pain.  10 tablet  0  . polyethylene glycol (MIRALAX / GLYCOLAX) packet Take 17 g by mouth daily as needed. constipation      . potassium chloride SA (K-DUR,KLOR-CON) 20 MEQ tablet Take 20 mEq by mouth daily.      . Probiotic Product (PROBIOTIC PO) Take 1 capsule by mouth daily as needed.       . rosuvastatin (CRESTOR) 20 MG tablet Take 20 mg by mouth daily.      . sucralfate (CARAFATE) 1 G tablet Take 1 g by mouth 2 (two)  times daily.      Marland Kitchen terconazole (TERAZOL 7) 0.4 % vaginal cream Place 1 applicator vaginally daily as needed. Yeast infection      . traMADol (ULTRAM) 50 MG tablet Take 50 mg by mouth every 6 (six) hours as needed. Pain      . valsartan (DIOVAN) 160 MG tablet Take 160 mg by mouth daily.         ROS:                                                                                                                                       History obtained from the patient  General ROS: negative for - chills, fatigue, fever, night sweats, weight gain or weight loss Psychological ROS: negative for - behavioral disorder, hallucinations, memory difficulties, mood swings or suicidal ideation Ophthalmic ROS: negative for - blurry vision, double vision, eye pain or loss of vision ENT ROS: negative for - epistaxis, nasal discharge, oral lesions, sore throat, tinnitus or vertigo Allergy and Immunology ROS: negative for - hives or itchy/watery eyes Hematological and Lymphatic ROS: negative for - bleeding problems, bruising or swollen lymph nodes Endocrine ROS: negative for - galactorrhea, hair pattern changes, polydipsia/polyuria or temperature intolerance Respiratory ROS: negative for - cough, hemoptysis, shortness of breath or wheezing Cardiovascular ROS: negative for - chest pain, dyspnea on exertion, edema or irregular heartbeat Gastrointestinal ROS: negative for - abdominal pain, diarrhea, hematemesis, nausea/vomiting or stool incontinence Genito-Urinary ROS: negative for - dysuria, hematuria, incontinence or urinary frequency/urgency Musculoskeletal ROS: negative for - joint swelling or muscular weakness Neurological ROS: as noted in HPI Dermatological ROS: negative for rash and skin lesion changes  Neurologic Examination:  Blood pressure 141/51, pulse 66, temperature 97.9 F (36.6 C), temperature  source Oral, resp. rate 19, SpO2 100.00%.  Mental Status: Alert, oriented, thought content appropriate.  Speech fluent without evidence of aphasia.  Able to follow 3 step commands without difficulty. Cranial Nerves: II: Discs flat bilaterally; Visual fields grossly normal, pupils equal, round, reactive to light and accommodation III,IV, VI: ptosis not present, extra-ocular motions intact bilaterally V,VII: smile symmetric, facial light touch sensation normal bilaterally VIII: hearing normal bilaterally IX,X: gag reflex present XI: bilateral shoulder shrug XII: midline tongue extension Motor: Right : Upper extremity   4/5 (residual)   Left:     Upper extremity   5/5  Lower extremity   4/5     Lower extremity   5/5 Tone and bulk:normal tone throughout; no atrophy noted Sensory: Pinprick and light touch intact throughout, bilaterally--slightly decreased on the right arm residual from old infarct. Deep Tendon Reflexes: 2+ and symmetric throughout Plantars: Mute bilaterally Cerebellar: normal finger-to-nose,  normal heel-to-shin test CV: pulses palpable throughout    No results found for this or any previous visit (from the past 48 hour(s)). No results found.  Assessment and plan discussed with with attending physician and they are in agreement.    Etta Quill PA-C [facility] [phone]  04/19/2012, 12:15 PM   Patient seen and examined.  Clinical course and management discussed.  Necessary edits performed.  I agree with the above.  Assessment and plan of care developed and discussed below.    Assessment: 72 y.o. female presenting with acute onset right facial droop that has resolved.  Patient has baseline deficits from a previous ischemic event.  Has multiple risk factors for small vessel disease.  On Plavix at home.  Stroke Risk Factors - diabetes mellitus, hypertension and stroke  Plan: 1. HgbA1c, fasting lipid panel 2. MRI, MRA  of the brain without contrast 3.  PT consult, OT consult 4. Echocardiogram 5. Carotid dopplers 6. Prophylactic therapy-Continue Plavix at 75mg  daily 7. Risk factor modification 8. Telemetry monitoring 9. Frequent neuro checks  Case discussed with Dr. Forbes Cellar, MD [facility] [phone]  04/19/2012  3:01 PM

## 2012-04-19 NOTE — ED Notes (Signed)
Family at bedside. 

## 2012-04-19 NOTE — ED Notes (Addendum)
Pt reports noticed right sided facial droop upon waking this AM. Per husband LSN was before going to bed. Pt alert, oriented x4, no facial weakness noted, neg arm drift, speech clear at present.

## 2012-04-19 NOTE — Progress Notes (Signed)
  Echocardiogram 2D Echocardiogram has been performed.  Tara Jackson 04/19/2012, 3:08 PM

## 2012-04-19 NOTE — Code Documentation (Signed)
72 yo female who was at rehab for cervical stenosis; she was noted at 1010 to have sudden onset of R facial droop. Her husband drove her to the hospital, arriving at 1125. She presented as a TIA, but upon doing the NIHSS, the nurse noted L facial droop, sensory loss on R and extinction. EDP exam was at 1148. Code stroke was activated at 1156. Code team arrived at 1203.  Labs were drawn and pt was taken to CT which was read as no acute abnormality. NIHSS repeated by stroke team with score of 2:  Facial droop has resolved, sensory loss noted on R, and RUQ field cut bilaterally.  Dr. Doy Mince here and will not give tPA since the only questionable new symptom is sensory loss and pt has improved. Plan is to admit for stroke work-up.  Husband/pt informed and are agreeable.  Hand-off with ED RN.

## 2012-04-19 NOTE — Progress Notes (Signed)
*  PRELIMINARY RESULTS* Vascular Ultrasound Carotid Duplex (Doppler) has been completed.  Preliminary findings: Bilateral:  No evidence of hemodynamically significant internal carotid artery stenosis.   Vertebral artery flow is antegrade.      Landry Mellow, RDMS, RVT  04/19/2012, 2:43 PM

## 2012-04-19 NOTE — Progress Notes (Signed)
Patient admitted into room 4n08.  Telemetry applied. Up with wlker to br. [place] Faster

## 2012-04-19 NOTE — ED Provider Notes (Signed)
History     CSN: [id number]  Arrival date & time 04/19/12  1031   First MD Initiated Contact with Patient 04/19/12 1116      Chief Complaint  Patient presents with  . Transient Ischemic Attack    (Consider location/radiation/quality/duration/timing/severity/associated sxs/prior treatment) The history is provided by the patient and the spouse.   patient is a 72 year old female primary care doctors Dr. Virgina Jock. Patient was at physical therapy for bilateral knee and neck pain. This morning when at 1010 patient's spouse noted that there was a right facial droop. Patient was also having trouble walking with parallel bars. 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 The new finding was a right facial droop. They contacted the primary care Dr. and patient was brought in by her spouse here to the ED. Shortly after arrival to the ED the right facial droop resolved. Patient's spouse noted that there was no evidence of right facial droop prior to getting to physical therapy.  Past Medical History  Diagnosis Date  . DVT (deep venous thrombosis)   . Asthma   . Hypothyroidism   . Anxiety   . Depression   . Sleep apnea     cpap disontinued  . Stroke     tia and stroke. Weakness Rt hand  . H/O hiatal hernia   . Arthritis   . Anemia   . Complication of anesthesia     hard to wake up after anesthesia  . LVH (left ventricular hypertrophy) due to hypertensive disease     enlarged heart, mild LVH, EF 55% by echo; no ischemia by stress 01/2010 echo [facility])  . 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 Hyperlipidemia   . Diabetes mellitus type 2, insulin dependent   . Hypertension associated with diabetes   . Sleep apnea   . Glaucoma   . Cataract     Past Surgical History  Procedure Laterality Date  . Abdominal hysterectomy    . Cholecystectomy    . Back surgery     steroid inj  . Tonsillectomy    . Appendectomy    . Esophagogastroduodenoscopy  08/09/2011    Procedure: ESOPHAGOGASTRODUODENOSCOPY (EGD);  Surgeon: Jeryl Columbia, MD;  Location: [facility];  Service: Endoscopy;  Laterality: N/A;  . Hot hemostasis  08/09/2011    Procedure: HOT HEMOSTASIS (ARGON PLASMA COAGULATION/BICAP);  Surgeon: Jeryl Columbia, MD;  Location: [facility];  Service: Endoscopy;  Laterality: N/A;  . Joint replacement      both knees  . Trigger finger release  11/22/2011    Procedure: RELEASE TRIGGER FINGER/A-1 PULLEY;  Surgeon: Meredith Pel, MD;  Location: [place];  Service: Orthopedics;  Laterality: Left;  Left trigger thumb release  . Eye surgery      History reviewed. No pertinent family history.  History  Substance Use Topics  . Smoking status: Never Smoker   . Smokeless tobacco: Never Used  . Alcohol Use: No    OB History   Grav Para Term Preterm Abortions TAB SAB Ect Mult Living                  Review of Systems  Constitutional: Negative for fever.  HENT: Positive for neck pain. Negative for congestion.   Eyes: Negative for pain and visual disturbance.  Respiratory: Negative for shortness of breath.   Cardiovascular:  Negative for chest pain.  Gastrointestinal: Negative for nausea, vomiting and abdominal pain.  Genitourinary: Negative for dysuria.  Musculoskeletal: Positive for arthralgias.  Skin: Negative for rash.  Neurological: Positive for facial asymmetry and weakness. Negative for numbness and headaches.  Hematological: Does not bruise/bleed easily.  Psychiatric/Behavioral: Negative for confusion.    Allergies  Iodine; Metoclopramide hcl; Sulfa antibiotics; Iohexol; Lactose intolerance (gi); and Lansoprazole  Home Medications   Current Outpatient Rx  Name  Route  Sig  Dispense  Refill  . Ascorbic Acid (VITAMIN C) 100 MG tablet   Oral   Take 100 mg by mouth daily.         . busPIRone (BUSPAR) 7.5 MG tablet   Oral   Take 7.5 mg by  mouth 2 (two) times daily.         . clonazePAM (KLONOPIN) 0.5 MG tablet   Oral   Take 0.5 mg by mouth 2 (two) times daily. 2 tablets in the am and 1 in the pm         . clopidogrel (PLAVIX) 75 MG tablet   Oral   Take 75 mg by mouth daily.         Marland Kitchen docusate sodium (COLACE) 100 MG capsule   Oral   Take 100 mg by mouth 2 (two) times daily as needed for constipation.          Marland Kitchen esomeprazole (NEXIUM) 40 MG capsule   Oral   Take 40 mg by mouth daily before breakfast.         . fluticasone (FLONASE) 50 MCG/ACT nasal spray   Nasal   Place 2 sprays into the nose daily as needed for rhinitis or allergies.          . furosemide (LASIX) 20 MG tablet   Oral   Take 20 mg by mouth daily as needed. For increased fluid         . glipiZIDE (GLUCOTROL) 10 MG tablet   Oral   Take 20 mg by mouth 2 (two) times daily before a meal.         . hydrochlorothiazide (MICROZIDE) 12.5 MG capsule   Oral   Take 12.5 mg by mouth daily as needed. For fluid retention         . hyoscyamine (LEVBID) 0.375 MG 12 hr tablet   Oral   Take 0.375 mg by mouth daily.          . insulin NPH (HUMULIN N,NOVOLIN N) 100 UNIT/ML injection   Subcutaneous   Inject 10-20 Units into the skin daily before breakfast. 20 units morning         . insulin regular (NOVOLIN R,HUMULIN R) 100 units/mL injection   Subcutaneous   Inject 20-30 Units into the skin 3 (three) times daily before meals. 30 units am 20 units at lunch and 20 units pm         . lactulose (CHRONULAC) 10 GM/15ML solution   Oral   Take 10 g by mouth daily as needed (with miralax).         Marland Kitchen levothyroxine (SYNTHROID, LEVOTHROID) 100 MCG tablet   Oral   Take 100 mcg by mouth daily.         Marland Kitchen lidocaine (LIDODERM) 5 %   Transdermal   Place 2 patches onto the skin daily. Wear patch on 12 hrs = OFF 12 hrs.  **BRAND NAME ONLY**         . LORazepam (ATIVAN) 1 MG tablet  Oral   Take 1 mg by mouth every 8 (eight) hours. For  anxiety         . meclizine (ANTIVERT) 12.5 MG tablet   Oral   Take 12.5 mg by mouth 3 (three) times daily as needed. dizziness         . ondansetron (ZOFRAN-ODT) 4 MG disintegrating tablet   Oral   Take 4 mg by mouth every 8 (eight) hours as needed for nausea.         . polyethylene glycol (MIRALAX / GLYCOLAX) packet   Oral   Take 17 g by mouth daily as needed (with lactulose). constipation         . potassium chloride SA (K-DUR,KLOR-CON) 20 MEQ tablet   Oral   Take 20 mEq by mouth daily.         . Probiotic Product (PROBIOTIC PO)   Oral   Take 1 capsule by mouth daily.          . rosuvastatin (CRESTOR) 20 MG tablet   Oral   Take 20 mg by mouth daily.         . sucralfate (CARAFATE) 1 G tablet   Oral   Take 1 g by mouth 2 (two) times daily.         Marland Kitchen terconazole (TERAZOL 7) 0.4 % vaginal cream   Vaginal   Place 1 applicator vaginally daily as needed. Yeast infection         . traMADol-acetaminophen (ULTRACET) 37.5-325 MG per tablet   Oral   Take 1 tablet by mouth every 6 (six) hours as needed for pain.         . valsartan (DIOVAN) 160 MG tablet   Oral   Take 160 mg by mouth daily.           BP 120/98  Pulse 55  Temp(Src) 97.7 F (36.5 C) (Oral)  Resp 12  SpO2 100%  Physical Exam  Nursing note and vitals reviewed. Constitutional: She is oriented to person, place, and time. She appears well-developed and well-nourished. No distress.  HENT:  Head: Normocephalic and atraumatic.  Mouth/Throat: Oropharynx is clear and moist.  Eyes: Conjunctivae and EOM are normal. Pupils are equal, round, and reactive to light.  Neck: Normal range of motion.  Cardiovascular: Normal rate, regular rhythm and normal heart sounds.   No murmur heard. Pulmonary/Chest: Effort normal and breath sounds normal. No respiratory distress.  Abdominal: Soft. Bowel sounds are normal. There is no tenderness.  Musculoskeletal: Normal range of motion.  Neurological: She  is alert and oriented to person, place, and time. No cranial nerve deficit.  Patient with baseline in residual weakness to her right upper extremity and right lower extremity. No evidence of any facial weakness on initial exam.  Skin: Skin is warm. No rash noted.    ED Course  Procedures (including critical care time)  Labs Reviewed  CBC - Abnormal; Notable for the following:    RBC 3.67 (*)    Hemoglobin 11.5 (*)    HCT 33.6 (*)    All other components within normal limits  COMPREHENSIVE METABOLIC PANEL - Abnormal; Notable for the following:    Glucose, Bld 275 (*)    Albumin 3.4 (*)    GFR calc non Af Amer 54 (*)    GFR calc Af Amer 62 (*)    All other components within normal limits  POCT I-STAT, CHEM 8 - Abnormal; Notable for the following:    Glucose, Bld 273 (*)  Calcium, Ion 1.05 (*)    Hemoglobin 11.6 (*)    HCT 34.0 (*)    All other components within normal limits  PROTIME-INR  APTT  DIFFERENTIAL  TROPONIN I  POCT I-STAT TROPONIN I   Ct Head Wo Contrast  04/19/2012  *RADIOLOGY REPORT*  Clinical Data: Code stroke with right sided facial droop.  Diffuse headache yesterday.  CT HEAD WITHOUT CONTRAST  Technique:  Contiguous axial images were obtained from the base of the skull through the vertex without contrast.  Comparison: CT head 09/24/2010.  Findings: No evidence of acute infarct, acute hemorrhage, mass lesion, mass effect or hydrocephalus.  Minimal periventricular low attenuation.  Visualized portions of the paranasal sinuses and mastoid air cells are clear.  IMPRESSION:  1.  No acute intracranial abnormality. These results were called by telephone on 04/19/2012 at 12:20 p.m. to Dr. Rogene Houston, who verbally acknowledged these results. 2.  Minimal chronic microvascular white matter ischemic changes.   Original Report Authenticated By: Lorin Picket, M.D.     Date: 04/19/2012  Rate: 60  Rhythm: normal sinus rhythm  QRS Axis: normal  Intervals: normal  ST/T Wave  abnormalities: normal  Conduction Disutrbances:first-degree A-V block   Narrative Interpretation:   Old EKG Reviewed: none available  Results for orders placed during the hospital encounter of 04/19/12  PROTIME-INR      Result Value Range   Prothrombin Time 12.9  11.6 - 15.2 seconds   INR 0.98  0.00 - 1.49  APTT      Result Value Range   aPTT 29  24 - 37 seconds  CBC      Result Value Range   WBC 7.3  4.0 - 10.5 K/uL   RBC 3.67 (*) 3.87 - 5.11 MIL/uL   Hemoglobin 11.5 (*) 12.0 - 15.0 g/dL   HCT 33.6 (*) 36.0 - 46.0 %   MCV 91.6  78.0 - 100.0 fL   MCH 31.3  26.0 - 34.0 pg   MCHC 34.2  30.0 - 36.0 g/dL   RDW 12.6  11.5 - 15.5 %   Platelets 218  150 - 400 K/uL  DIFFERENTIAL      Result Value Range   Neutrophils Relative 67  43 - 77 %   Neutro Abs 4.9  1.7 - 7.7 K/uL   Lymphocytes Relative 26  12 - 46 %   Lymphs Abs 1.9  0.7 - 4.0 K/uL   Monocytes Relative 5  3 - 12 %   Monocytes Absolute 0.4  0.1 - 1.0 K/uL   Eosinophils Relative 2  0 - 5 %   Eosinophils Absolute 0.1  0.0 - 0.7 K/uL   Basophils Relative 1  0 - 1 %   Basophils Absolute 0.1  0.0 - 0.1 K/uL  COMPREHENSIVE METABOLIC PANEL      Result Value Range   Sodium 138  135 - 145 mEq/L   Potassium 4.0  3.5 - 5.1 mEq/L   Chloride 100  96 - 112 mEq/L   CO2 25  19 - 32 mEq/L   Glucose, Bld 275 (*) 70 - 99 mg/dL   BUN 15  6 - 23 mg/dL   Creatinine, Ser 1.01  0.50 - 1.10 mg/dL   Calcium 8.9  8.4 - 10.5 mg/dL   Total Protein 6.0  6.0 - 8.3 g/dL   Albumin 3.4 (*) 3.5 - 5.2 g/dL   AST 15  0 - 37 U/L   ALT 10  0 - 35 U/L   Alkaline  Phosphatase 73  39 - 117 U/L   Total Bilirubin 0.5  0.3 - 1.2 mg/dL   GFR calc non Af Amer 54 (*) >90 mL/min   GFR calc Af Amer 62 (*) >90 mL/min  TROPONIN I      Result Value Range   Troponin I <0.30  <0.30 ng/mL  POCT I-STAT, CHEM 8      Result Value Range   Sodium 137  135 - 145 mEq/L   Potassium 3.7  3.5 - 5.1 mEq/L   Chloride 102  96 - 112 mEq/L   BUN 14  6 - 23 mg/dL   Creatinine,  Ser 1.00  0.50 - 1.10 mg/dL   Glucose, Bld 273 (*) 70 - 99 mg/dL   Calcium, Ion 1.05 (*) 1.13 - 1.30 mmol/L   TCO2 27  0 - 100 mmol/L   Hemoglobin 11.6 (*) 12.0 - 15.0 g/dL   HCT 34.0 (*) 36.0 - 46.0 %  POCT I-STAT TROPONIN I      Result Value Range   Troponin i, poc 0.00  0.00 - 0.08 ng/mL   Comment 3               CRITICAL CARE Performed by: Mervin Kung.   Total critical care time: 30  Critical care time was exclusive of separately billable procedures and treating other patients.  Critical care was necessary to treat or prevent imminent or life-threatening deterioration.  Critical care was time spent personally by me on the following activities: development of treatment plan with patient and/or surrogate as well as nursing, discussions with consultants, evaluation of patient's response to treatment, examination of patient, obtaining history from patient or surrogate, ordering and performing treatments and interventions, ordering and review of laboratory studies, ordering and review of radiographic studies, pulse oximetry and re-evaluation of patient's condition.   1. TIA (transient ischemic attack)       MDM  Patient initially not triaged properly. Onset of symptoms were at 1010 witnessed by spouse definitely had right facial droop. That resolved in an hour later basically upon arrival here. Code stroke was initiated because of the timing of the symptoms. Patient's had an old stroke about 20 years ago with some mild right-sided residual weakness. Head CT was negative. Consult by stroke team and neurology recommends admission for TIA symptoms definitely consistent with a transient ischemic attack patient needs the neuro admission to telemetry type bed. Contacted the patient's primary care Dr. Dr. Virgina Jock and the he will admit [place] temporary admit orders to facilitate the admission. MRI has been ordered but is not back at. Head CT was negative. By initiating the code stroke  the patient was able to get her head CT quickly and stroke labs were obtained. Immediate consult to neurology. Patient has several risk factors including previous stroke also diabetic today's labs showed an elevated blood sugar no significant anemia normal renal function. Head CT was negative for acute bleed or for evidence of an acute stroke. However patient's symptoms still consistent with a TIA.        Mervin Kung, MD 04/19/12 [phone]

## 2012-04-20 DIAGNOSIS — G459 Transient cerebral ischemic attack, unspecified: Secondary | ICD-10-CM

## 2012-04-20 LAB — HEMOGLOBIN A1C
Hgb A1c MFr Bld: 8.2 % — ABNORMAL HIGH (ref <5.7)
Mean Plasma Glucose: 189 mg/dL — ABNORMAL HIGH (ref <117)

## 2012-04-20 LAB — BASIC METABOLIC PANEL
BUN: 15 mg/dL (ref 6–23)
CO2: 28 mEq/L (ref 19–32)
Calcium: 8.5 mg/dL (ref 8.4–10.5)
Chloride: 101 mEq/L (ref 96–112)
Creatinine, Ser: 1 mg/dL (ref 0.50–1.10)
GFR calc Af Amer: 63 mL/min — ABNORMAL LOW (ref >90)
GFR calc non Af Amer: 55 mL/min — ABNORMAL LOW (ref >90)
Glucose, Bld: 250 mg/dL — ABNORMAL HIGH (ref 70–99)
Potassium: 3.8 mEq/L (ref 3.5–5.1)
Sodium: 138 mEq/L (ref 135–145)

## 2012-04-20 LAB — GLUCOSE, CAPILLARY
Glucose-Capillary: 223 mg/dL — ABNORMAL HIGH (ref 70–99)
Glucose-Capillary: 220 mg/dL — ABNORMAL HIGH (ref 70–99)
Glucose-Capillary: 254 mg/dL — ABNORMAL HIGH (ref 70–99)

## 2012-04-20 LAB — LIPID PANEL
Cholesterol: 125 mg/dL (ref 0–200)
HDL: 49 mg/dL (ref >39)
LDL Cholesterol: 42 mg/dL (ref 0–99)
Total CHOL/HDL Ratio: 2.6 RATIO
Triglycerides: 168 mg/dL — ABNORMAL HIGH (ref <150)
VLDL: 34 mg/dL (ref 0–40)

## 2012-04-20 LAB — CBC
HCT: 32.2 % — ABNORMAL LOW (ref 36.0–46.0)
Hemoglobin: 10.9 g/dL — ABNORMAL LOW (ref 12.0–15.0)
MCH: 31.1 pg (ref 26.0–34.0)
MCHC: 33.9 g/dL (ref 30.0–36.0)
MCV: 91.7 fL (ref 78.0–100.0)
Platelets: 215 10*3/uL (ref 150–400)
RBC: 3.51 MIL/uL — ABNORMAL LOW (ref 3.87–5.11)
RDW: 12.7 % (ref 11.5–15.5)
WBC: 6.3 10*3/uL (ref 4.0–10.5)

## 2012-04-20 MED ORDER — INSULIN NPH (HUMAN) (ISOPHANE) 100 UNIT/ML ~~LOC~~ SUSP: 15.0000 [IU] | Freq: Every day | SUBCUTANEOUS | Status: DC

## 2012-04-20 NOTE — Discharge Summary (Signed)
Physician Discharge Summary  DISCHARGE SUMMARY   Patient ID: Tara Jackson MR#: [id number] DOB/AGE: 1940/09/11 72 y.o.   Attending 60 M  Patient's [id number] M, MD  Consults:  Neuro Hospitalist  Admit date: 04/19/2012 Discharge date: 04/20/2012  Discharge Diagnoses:  Active Problems:   * No active hospital problems. *   Patient Active Problem List  Diagnosis  . HYPERLIPIDEMIA  . HYPERTENSION  . ALLERGIC RHINITIS  . INTRINSIC ASTHMA, UNSPECIFIED  . SLEEP APNEA  . DYSPNEA -- worsening exertional shortness of breath  . PULMONARY EMBOLISM, HX OF  . Chest pain, exertional -- with shortness of Breath;  . Abnormal nuclear cardiac imaging test -- Intermediate Risk with inferior-inferolateral ischemia.   Past Medical History  Diagnosis Date  . DVT (deep venous thrombosis)   . Asthma   . Hypothyroidism   . Anxiety   . Depression   . Sleep apnea     cpap disontinued  . Stroke     tia and stroke. Weakness Rt hand  . H/O hiatal hernia   . Arthritis   . Anemia   . Complication of anesthesia     hard to wake up after anesthesia  . LVH (left ventricular hypertrophy) due to hypertensive disease     enlarged heart, mild LVH, EF 55% by echo; no ischemia by stress 01/2010 echo [facility])  . 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 Hyperlipidemia   . Diabetes mellitus type 2, insulin dependent   . Hypertension associated with diabetes   . Sleep apnea   . Glaucoma   . Cataract     Discharged Condition: stable   Discharge Medications:   Medication List    STOP taking these medications       LORazepam 1 MG tablet  Commonly known as:  ATIVAN     terconazole 0.4 % vaginal cream  Commonly known as:  TERAZOL 7      TAKE these medications       busPIRone 7.5 MG tablet  Commonly known as:  BUSPAR  Take 7.5 mg by mouth 2 (two) times daily.     clonazePAM 0.5 MG tablet   Commonly known as:  KLONOPIN  Take 0.5 mg by mouth 2 (two) times daily. 2 tablets in the am and 1 in the pm     clopidogrel 75 MG tablet  Commonly known as:  PLAVIX  Take 75 mg by mouth daily.     docusate sodium 100 MG capsule  Commonly known as:  COLACE  Take 100 mg by mouth 2 (two) times daily as needed for constipation.     esomeprazole 40 MG capsule  Commonly known as:  NEXIUM  Take 40 mg by mouth daily before breakfast.     fluticasone 50 MCG/ACT nasal spray  Commonly known as:  FLONASE  Place 2 sprays into the nose daily as needed for rhinitis or allergies.     furosemide 20 MG tablet  Commonly known as:  LASIX  Take 20 mg by mouth daily as needed. For increased fluid     glipiZIDE 10 MG tablet  Commonly known as:  GLUCOTROL  Take 20 mg by mouth 2 (two) times daily before a meal.     hydrochlorothiazide 12.5 MG capsule  Commonly known as:  MICROZIDE  Take 12.5 mg by mouth daily as needed. For fluid retention     hyoscyamine 0.375 MG 12  hr tablet  Commonly known as:  LEVBID  Take 0.375 mg by mouth daily.     insulin NPH 100 UNIT/ML injection  Commonly known as:  HUMULIN N,NOVOLIN N  Inject 15 Units into the skin at bedtime.     insulin NPH 100 UNIT/ML injection  Commonly known as:  HUMULIN N,NOVOLIN N  Inject 10-20 Units into the skin daily before breakfast. 20 units morning     insulin regular 100 units/mL injection  Commonly known as:  NOVOLIN R,HUMULIN R  Inject 20-30 Units into the skin 3 (three) times daily before meals. 30 units am 20 units at lunch and 20 units pm     lactulose 10 GM/15ML solution  Commonly known as:  CHRONULAC  Take 10 g by mouth daily as needed (with miralax).     levothyroxine 100 MCG tablet  Commonly known as:  SYNTHROID, LEVOTHROID  Take 100 mcg by mouth daily.     lidocaine 5 %  Commonly known as:  LIDODERM  Place 2 patches onto the skin daily. Wear patch on 12 hrs = OFF 12 hrs.    **BRAND NAME ONLY**     meclizine  12.5 MG tablet  Commonly known as:  ANTIVERT  Take 12.5 mg by mouth 3 (three) times daily as needed. dizziness     ondansetron 4 MG disintegrating tablet  Commonly known as:  ZOFRAN-ODT  Take 4 mg by mouth every 8 (eight) hours as needed for nausea.     polyethylene glycol packet  Commonly known as:  MIRALAX / GLYCOLAX  Take 17 g by mouth daily as needed (with lactulose). constipation     potassium chloride SA 20 MEQ tablet  Commonly known as:  K-DUR,KLOR-CON  Take 20 mEq by mouth daily.     PROBIOTIC PO  Take 1 capsule by mouth daily.     rosuvastatin 20 MG tablet  Commonly known as:  CRESTOR  Take 20 mg by mouth daily.     sucralfate 1 G tablet  Commonly known as:  CARAFATE  Take 1 g by mouth 2 (two) times daily.     ULTRACET 37.5-325 MG per tablet  Generic drug:  traMADol-acetaminophen  Take 1 tablet by mouth every 6 (six) hours as needed for pain.     valsartan 160 MG tablet  Commonly known as:  DIOVAN  Take 160 mg by mouth daily.     vitamin C 100 MG tablet  Take 100 mg by mouth daily.        Hospital Procedures: Ct Head Wo Contrast  04/19/2012  *RADIOLOGY REPORT*  Clinical Data: Code stroke with right sided facial droop.  Diffuse headache yesterday.  CT HEAD WITHOUT CONTRAST  Technique:  Contiguous axial images were obtained from the base of the skull through the vertex without contrast.  Comparison: CT head 09/24/2010.  Findings: No evidence of acute infarct, acute hemorrhage, mass lesion, mass effect or hydrocephalus.  Minimal periventricular low attenuation.  Visualized portions of the paranasal sinuses and mastoid air cells are clear.  IMPRESSION:  1.  No acute intracranial abnormality. These results were called by telephone on 04/19/2012 at 12:20 p.m. to Dr. Rogene Houston, who verbally acknowledged these results. 2.  Minimal chronic microvascular white matter ischemic changes.   Original Report Authenticated By: Lorin Picket, M.D.    Mr [facility] Wo  Contrast  04/19/2012  *RADIOLOGY REPORT*  Clinical Data:  Dizzy with right-sided facial droop which has resolved.  Hyperlipidemia.  MRI BRAIN WITHOUT CONTRAST MRA HEAD  WITHOUT CONTRAST  Technique: Multiplanar, multiecho pulse sequences of the brain and surrounding structures were obtained according to standard protocol without intravenous contrast.  Angiographic images of the head were obtained using MRA technique without contrast.  Comparison: 04/19/2012 CT.  02/16/2010 MR.  MRI HEAD  Findings:  No acute infarct.  No intracranial hemorrhage.  Mild small vessel disease type changes.  No hydrocephalus.  Nonspecific 1.1 cm lesion in the right occipital bone posterior to the junction of the right transverse sinus and sigmoid sinus is unchanged. This has well-defined borders and is most likely benign possibly an atypical arachnoid granulation. Stability can be confirmed on follow-up.  Cervical medullary junction, pineal region and orbital structures unremarkable.  Slightly small pituitary gland.  IMPRESSION: No acute infarct.  Mild small vessel disease type changes.  Nonspecific 1.1 cm lesion in the right occipital bone posterior to the junction of the right transverse sinus and sigmoid sinus is unchanged. This has well-defined borders and is most likely benign possibly an atypical arachnoid granulation.  MRA HEAD  Findings: Motion degraded exam.  Narrowing of the right anterior cerebral artery with tandem stenoses.  Otherwise anterior circulation without medium or large size vessel significant stenosis or occlusion.  Mild middle cerebral artery branch vessel irregularity bilaterally.  Right vertebral artery is dominant.  Nonvisualization PICAs and AICAs.  Irregularity and narrowing of the superior cerebellar artery bilaterally.  Irregularity with areas of narrowing involving portions of the posterior cerebral artery more notable on the left.  No obvious aneurysm.  IMPRESSION: Exam is slightly motion degraded.  Branch  vessel atherosclerotic type changes as noted above.   Original Report Authenticated By: Genia Del, M.D.    Mr Brain Wo Contrast  04/19/2012  *RADIOLOGY REPORT*  Clinical Data:  Dizzy with right-sided facial droop which has resolved.  Hyperlipidemia.  MRI BRAIN WITHOUT CONTRAST MRA HEAD WITHOUT CONTRAST  Technique: Multiplanar, multiecho pulse sequences of the brain and surrounding structures were obtained according to standard protocol without intravenous contrast.  Angiographic images of the head were obtained using MRA technique without contrast.  Comparison: 04/19/2012 CT.  02/16/2010 MR.  MRI HEAD  Findings:  No acute infarct.  No intracranial hemorrhage.  Mild small vessel disease type changes.  No hydrocephalus.  Nonspecific 1.1 cm lesion in the right occipital bone posterior to the junction of the right transverse sinus and sigmoid sinus is unchanged. This has well-defined borders and is most likely benign possibly an atypical arachnoid granulation. Stability can be confirmed on follow-up.  Cervical medullary junction, pineal region and orbital structures unremarkable.  Slightly small pituitary gland.  IMPRESSION: No acute infarct.  Mild small vessel disease type changes.  Nonspecific 1.1 cm lesion in the right occipital bone posterior to the junction of the right transverse sinus and sigmoid sinus is unchanged. This has well-defined borders and is most likely benign possibly an atypical arachnoid granulation.  MRA HEAD  Findings: Motion degraded exam.  Narrowing of the right anterior cerebral artery with tandem stenoses.  Otherwise anterior circulation without medium or large size vessel significant stenosis or occlusion.  Mild middle cerebral artery branch vessel irregularity bilaterally.  Right vertebral artery is dominant.  Nonvisualization PICAs and AICAs.  Irregularity and narrowing of the superior cerebellar artery bilaterally.  Irregularity with areas of narrowing involving portions of the  posterior cerebral artery more notable on the left.  No obvious aneurysm.  IMPRESSION: Exam is slightly motion degraded.  Branch vessel atherosclerotic type changes as noted above.   Original Report  Authenticated By: Genia Del, M.D.     History of Present Illness: 21 F with MMP on Multiple meds was at Outpt PT for her disabling Back pain and spinal stnenosis when she had R Facial droop and Numbness.  She was sent to the ED for Eval and Rx.  Hospital Course: Admitted yesterday with TIA. All W/up is done and (-). Tele and EKG NSR. She will stay on Plavix. She is at her baseline and can be D/Ced today. No chages to meds but we will work on RF Reduction and tightening of her DM2s  TIA - Continue Plavix. Get Therapy here today and go home. R/Out UTI. Tele = NSR. CCT (-). Carotid Duplex (Doppler) Preliminary findings: Bilateral: No evidence of hemodynamically significant internal carotid artery stenosis. Vertebral artery flow is antegrade. 2D ECHO - Left ventricle: The cavity size was normal. Systolic function was normal. The estimated ejection fraction was in the range of 55% to 60%. Wall motion was normal; there were no regional wall motion abnormalities. Left ventricular diastolic function parameters were normal. MRI No acute infarct. Mild small vessel disease type changes. Nonspecific 1.1 cm lesion in the right occipital bone posterior to the junction of the right transverse sinus and sigmoid sinus is unchanged. This has well-defined borders and is most likely benign possibly an atypical arachnoid granulation. MRA Exam is slightly motion degraded. Branch vessel atherosclerotic type changes as noted. RF Modification.   meds have been reconciled and attempted to wean some off.  She needs to lose weight.  Elevated Lipids - on Rx - #s perfect. TC 125. TG 168. HDL 49. LDL 42.  BP is fine and we will continue to strive towards goal of < 135/85.    Day of Discharge Exam BP 142/51  Pulse 72   Temp(Src) 98 F (36.7 C) (Oral)  Resp 18  SpO2 98%  Physical Exam: See PN  Discharge Labs:  Recent Labs  04/19/12 1228 04/19/12 1237 04/20/12 0514  NA 138 137 138  K 4.0 3.7 3.8  CL 100 102 101  CO2 25  --  28  GLUCOSE 275* 273* 250*  BUN 15 14 15   CREATININE 1.01 1.00 1.00  CALCIUM 8.9  --  8.5    Recent Labs  04/19/12 1228  AST 15  ALT 10  ALKPHOS 73  BILITOT 0.5  PROT 6.0  ALBUMIN 3.4*    Recent Labs  04/19/12 1228 04/19/12 1237 04/20/12 0514  WBC 7.3  --  6.3  NEUTROABS 4.9  --   --   HGB 11.5* 11.6* 10.9*  HCT 33.6* 34.0* 32.2*  MCV 91.6  --  91.7  PLT 218  --  215    Recent Labs  04/19/12 1228  TROPONINI <0.30   No results found for this basename: TSH, T4TOTAL, FREET3, T3FREE, THYROIDAB,  in the last 72 hours No results found for this basename: VITAMINB12, FOLATE, FERRITIN, TIBC, IRON, RETICCTPCT,  in the last 72 hours Lab Results  Component Value Date   INR 0.98 04/19/2012   INR 0.89 12/05/2010   INR 0.90 05/12/2009       Discharge instructions:  01-Home or Self Care     Follow-up Information   Follow up with [name], MD In 2 weeks.   Contact information:   [facility] [place] [address] [phone]        Disposition: home  Follow-up Appts: Follow-up with Dr. Virgina Jock at [facility] in 2 weeks.Marland Kitchen  Call for appointment.  Condition on Discharge: stable  Tests Needing Follow-up: none  Time spent in discharge (includes decision making & examination of pt): 30 min  Signed: Daanish Copes M 04/20/2012, 8:08 AM

## 2012-04-20 NOTE — Progress Notes (Signed)
Stroke Team Progress Note  HISTORY Tara Jackson is an 72 y.o. female who was at physical therapy for her cervical spine problems today 04/19/2012 when she was noted to have a right facial droop. Her husband brought her to the ED for possible stroke. While in the ED her symptoms had fully resolved. Patient continues to have right arm decreased sensation and weakness but states this is residual from her previous stroke and not new. Patient was not a TPA candidate secondary to resolved symptoms. She was admitted for further evaluation and treatment.  SUBJECTIVE No family is at the bedside.  Overall she feels her condition is stable.   OBJECTIVE Most recent Vital Signs: Filed Vitals:   04/19/12 2200 04/20/12 0225 04/20/12 0524 04/20/12 1016  BP: 137/42 98/60 142/51 148/63  Pulse: 61 60 72 64  Temp: 98.6 F (37 C) 98.3 F (36.8 C) 98 F (36.7 C) 97.4 F (36.3 C)  TempSrc:    Oral  Resp: 18 16 18 18   SpO2: 98% 94% 98% 98%   CBG (last 3)   Recent Labs  04/19/12 1710 04/19/12 2236 04/20/12 0655  GLUCAP 241* 223* 220*    IV Fluid Intake:     MEDICATIONS  . atorvastatin  20 mg Oral q1800  . busPIRone  7.5 mg Oral BID  . clonazePAM  0.5 mg Oral BID  . clopidogrel  75 mg Oral Daily  . enoxaparin (LOVENOX) injection  40 mg Subcutaneous Q24H  . glipiZIDE  20 mg Oral BID AC  . hyoscyamine  0.375 mg Oral Daily  . insulin aspart  0-15 Units Subcutaneous TID WC  . insulin aspart  0-5 Units Subcutaneous QHS  . insulin NPH  15 Units Subcutaneous QAC breakfast  . insulin NPH  15 Units Subcutaneous QHS  . irbesartan  150 mg Oral Daily  . levothyroxine  100 mcg Oral QAC breakfast  . lidocaine  2 patch Transdermal Q24H  . pantoprazole  40 mg Oral Daily  . potassium chloride SA  20 mEq Oral Daily  . sodium chloride  3 mL Intravenous Q12H  . sucralfate  1 g Oral BID   PRN:  sodium chloride, docusate sodium, fluticasone, furosemide, lactulose, ondansetron, polyethylene glycol, sodium  chloride, traMADol-acetaminophen  Diet:  Carb Control thin liquids Activity:   DVT Prophylaxis:  Lovenox 40 mg sq daily   CLINICALLY SIGNIFICANT STUDIES Basic Metabolic Panel:  Recent Labs Lab 04/19/12 1228 04/19/12 1237 04/20/12 0514  NA 138 137 138  K 4.0 3.7 3.8  CL 100 102 101  CO2 25  --  28  GLUCOSE 275* 273* 250*  BUN 15 14 15   CREATININE 1.01 1.00 1.00  CALCIUM 8.9  --  8.5   Liver Function Tests:  Recent Labs Lab 04/19/12 1228  AST 15  ALT 10  ALKPHOS 73  BILITOT 0.5  PROT 6.0  ALBUMIN 3.4*   CBC:  Recent Labs Lab 04/19/12 1228 04/19/12 1237 04/20/12 0514  WBC 7.3  --  6.3  NEUTROABS 4.9  --   --   HGB 11.5* 11.6* 10.9*  HCT 33.6* 34.0* 32.2*  MCV 91.6  --  91.7  PLT 218  --  215   Coagulation:  Recent Labs Lab 04/19/12 1228  LABPROT 12.9  INR 0.98   Cardiac Enzymes:  Recent Labs Lab 04/19/12 1228  TROPONINI <0.30   Urinalysis: No results found for this basename: COLORURINE, APPERANCEUR, LABSPEC, PHURINE, GLUCOSEU, HGBUR, BILIRUBINUR, KETONESUR, PROTEINUR, UROBILINOGEN, NITRITE, LEUKOCYTESUR,  in the last  168 hours Lipid Panel    Component Value Date/Time   CHOL 125 04/20/2012 0514   TRIG 168* 04/20/2012 0514   HDL 49 04/20/2012 0514   CHOLHDL 2.6 04/20/2012 0514   VLDL 34 04/20/2012 0514   LDLCALC 42 04/20/2012 0514   HgbA1C  No results found for this basename: HGBA1C    Urine Drug Screen:   No results found for this basename: labopia, cocainscrnur, labbenz, amphetmu, thcu, labbarb    Alcohol Level: No results found for this basename: ETH,  in the last 168 hours  CT of the brain  04/19/2012   1.  No acute intracranial abnormality.  2.  Minimal chronic microvascular white matter ischemic changes.     MRI of the brain  04/19/2012  No acute infarct.  Mild small vessel disease type changes.  Nonspecific 1.1 cm lesion in the right occipital bone posterior to the junction of the right transverse sinus and sigmoid sinus is unchanged. This  has well-defined borders and is most likely benign possibly an atypical arachnoid granulation.   MRA of the brain   04/19/2012  Exam is slightly motion degraded.  Branch vessel atherosclerotic type changes   2D Echocardiogram  EF 55-60% with no source of embolus.   Carotid Doppler  No evidence of hemodynamically significant internal carotid artery stenosis. Vertebral artery flow is antegrade.   CXR    EKG  normal sinus rhythm.   Therapy Recommendations no therapy needs  Physical Exam   Obese african american lady not in distress.Awake alert. Afebrile. Head is nontraumatic. Neck is supple without bruit. Hearing is normal. Cardiac exam no murmur or gallop. Lungs are clear to auscultation. Distal pulses are well felt. Neurological Exam :Awake  Alert oriented x 3. Normal speech and language.eye movements full without nystagmus.Fundi not visualized. Vision acuity and fields are normal. Face symmetric. Tongue midline. Normal strength, tone, reflexes and coordination. Normal sensation. Gait deferred. ASSESSMENT Ms. Tara Jackson is a 72 y.o. female presenting with right hand weakness and right lower facial weakness.  Imaging confirms no acute  infarct. Dx:  Left  brain TIA. TIA infarct felt to be thrombotic secondary to small vessel disease.  On clopidogrel 75 mg orally every day prior to admission. Now on clopidogrel 75 mg orally every day for secondary stroke prevention. Has history of liver disease on coumadin and severe headache on persantine. Patient with resultant facial weakness, hand weakness has resolved. Work up underway.   Obesity, There is no weight on file to calculate BMI.  Hx mult DVT, PE, etiology unknown, on coumadin for years, now off due to liver complications  Hospital day # 1  TREATMENT/PLAN  Continue clopidogrel 75 mg orally every day for secondary stroke prevention.  Patient already discharged No further stroke workup indicated. Patient has a 10-15% risk of having  another stroke over the next year, the highest risk is within 2 weeks of the most recent stroke/TIA (risk of having a stroke following a stroke or TIA is the same). Follow up with Dr. Leonie Man, [facility], in 2 months.  Burnetta Sabin, MSN, RN, ANVP-BC, ANP-BC, [facility] Pager: [phone] 04/20/2012 11:17 AM  I have personally obtained a history, examined the patient, evaluated imaging results, and formulated the assessment and plan of care. I agree with the above.  Antony Contras, MD

## 2012-04-20 NOTE — Progress Notes (Signed)
Subjective: Admitted yesterday with TIA.  All W/up is done and (-). Some Ab discomfort but benign exam - Will check UA All labs reviewed. She is nat or near baseline and can go home today  Objective: Vital signs in last 24 hours: Temp:  [97.7 F (36.5 C)-98.6 F (37 C)] 98 F (36.7 C) (03/14 0524) Pulse Rate:  [55-74] 72 (03/14 0524) Resp:  [12-22] 18 (03/14 0524) BP: (98-151)/(37-98) 142/51 mmHg (03/14 0524) SpO2:  [94 %-100 %] 98 % (03/14 0524) Weight change:     CBG (last 3)   Recent Labs  04/19/12 1710  GLUCAP 241*    Intake/Output from previous day: No intake or output data in the 24 hours ending 04/20/12 0741     Physical Exam General appearance: A and O times 3.  At baseline No facial droop.  Speech fine.  Sensation 98% better. Swallowing fine. Tongue midline Eyes: no scleral icterus Throat: oropharynx moist without erythema Resp: CTA Cardio:Reg GI: soft, non-tender; bowel sounds normal; no masses,  no organomegaly Extremities: no clubbing, cyanosis or edema   Lab Results:  Recent Labs  04/19/12 1228 04/19/12 1237 04/20/12 0514  NA 138 137 138  K 4.0 3.7 3.8  CL 100 102 101  CO2 25  --  28  GLUCOSE 275* 273* 250*  BUN 15 14 15   CREATININE 1.01 1.00 1.00  CALCIUM 8.9  --  8.5     Recent Labs  04/19/12 1228  AST 15  ALT 10  ALKPHOS 73  BILITOT 0.5  PROT 6.0  ALBUMIN 3.4*     Recent Labs  04/19/12 1228 04/19/12 1237 04/20/12 0514  WBC 7.3  --  6.3  NEUTROABS 4.9  --   --   HGB 11.5* 11.6* 10.9*  HCT 33.6* 34.0* 32.2*  MCV 91.6  --  91.7  PLT 218  --  215    Lab Results  Component Value Date   INR 0.98 04/19/2012   INR 0.89 12/05/2010   INR 0.90 05/12/2009     Recent Labs  04/19/12 1228  TROPONINI <0.30    No results found for this basename: TSH, T4TOTAL, FREET3, T3FREE, THYROIDAB,  in the last 72 hours  No results found for this basename: VITAMINB12, FOLATE, FERRITIN, TIBC, IRON, RETICCTPCT,  in the last 72  hours  Micro Results: No results found for this or any previous visit (from the past 240 hour(s)).   Studies/Results: Ct Head Wo Contrast  04/19/2012  *RADIOLOGY REPORT*  Clinical Data: Code stroke with right sided facial droop.  Diffuse headache yesterday.  CT HEAD WITHOUT CONTRAST  Technique:  Contiguous axial images were obtained from the base of the skull through the vertex without contrast.  Comparison: CT head 09/24/2010.  Findings: No evidence of acute infarct, acute hemorrhage, mass lesion, mass effect or hydrocephalus.  Minimal periventricular low attenuation.  Visualized portions of the paranasal sinuses and mastoid air cells are clear.  IMPRESSION:  1.  No acute intracranial abnormality. These results were called by telephone on 04/19/2012 at 12:20 p.m. to Dr. Rogene Houston, who verbally acknowledged these results. 2.  Minimal chronic microvascular white matter ischemic changes.   Original Report Authenticated By: Lorin Picket, M.D.    Mr [facility] Wo Contrast  04/19/2012  *RADIOLOGY REPORT*  Clinical Data:  Dizzy with right-sided facial droop which has resolved.  Hyperlipidemia.  MRI BRAIN WITHOUT CONTRAST MRA HEAD WITHOUT CONTRAST  Technique: Multiplanar, multiecho pulse sequences of the brain and surrounding structures were obtained according  to standard protocol without intravenous contrast.  Angiographic images of the head were obtained using MRA technique without contrast.  Comparison: 04/19/2012 CT.  02/16/2010 MR.  MRI HEAD  Findings:  No acute infarct.  No intracranial hemorrhage.  Mild small vessel disease type changes.  No hydrocephalus.  Nonspecific 1.1 cm lesion in the right occipital bone posterior to the junction of the right transverse sinus and sigmoid sinus is unchanged. This has well-defined borders and is most likely benign possibly an atypical arachnoid granulation. Stability can be confirmed on follow-up.  Cervical medullary junction, pineal region and orbital structures  unremarkable.  Slightly small pituitary gland.  IMPRESSION: No acute infarct.  Mild small vessel disease type changes.  Nonspecific 1.1 cm lesion in the right occipital bone posterior to the junction of the right transverse sinus and sigmoid sinus is unchanged. This has well-defined borders and is most likely benign possibly an atypical arachnoid granulation.  MRA HEAD  Findings: Motion degraded exam.  Narrowing of the right anterior cerebral artery with tandem stenoses.  Otherwise anterior circulation without medium or large size vessel significant stenosis or occlusion.  Mild middle cerebral artery branch vessel irregularity bilaterally.  Right vertebral artery is dominant.  Nonvisualization PICAs and AICAs.  Irregularity and narrowing of the superior cerebellar artery bilaterally.  Irregularity with areas of narrowing involving portions of the posterior cerebral artery more notable on the left.  No obvious aneurysm.  IMPRESSION: Exam is slightly motion degraded.  Branch vessel atherosclerotic type changes as noted above.   Original Report Authenticated By: Genia Del, M.D.    Mr Brain Wo Contrast  04/19/2012  *RADIOLOGY REPORT*  Clinical Data:  Dizzy with right-sided facial droop which has resolved.  Hyperlipidemia.  MRI BRAIN WITHOUT CONTRAST MRA HEAD WITHOUT CONTRAST  Technique: Multiplanar, multiecho pulse sequences of the brain and surrounding structures were obtained according to standard protocol without intravenous contrast.  Angiographic images of the head were obtained using MRA technique without contrast.  Comparison: 04/19/2012 CT.  02/16/2010 MR.  MRI HEAD  Findings:  No acute infarct.  No intracranial hemorrhage.  Mild small vessel disease type changes.  No hydrocephalus.  Nonspecific 1.1 cm lesion in the right occipital bone posterior to the junction of the right transverse sinus and sigmoid sinus is unchanged. This has well-defined borders and is most likely benign possibly an atypical  arachnoid granulation. Stability can be confirmed on follow-up.  Cervical medullary junction, pineal region and orbital structures unremarkable.  Slightly small pituitary gland.  IMPRESSION: No acute infarct.  Mild small vessel disease type changes.  Nonspecific 1.1 cm lesion in the right occipital bone posterior to the junction of the right transverse sinus and sigmoid sinus is unchanged. This has well-defined borders and is most likely benign possibly an atypical arachnoid granulation.  MRA HEAD  Findings: Motion degraded exam.  Narrowing of the right anterior cerebral artery with tandem stenoses.  Otherwise anterior circulation without medium or large size vessel significant stenosis or occlusion.  Mild middle cerebral artery branch vessel irregularity bilaterally.  Right vertebral artery is dominant.  Nonvisualization PICAs and AICAs.  Irregularity and narrowing of the superior cerebellar artery bilaterally.  Irregularity with areas of narrowing involving portions of the posterior cerebral artery more notable on the left.  No obvious aneurysm.  IMPRESSION: Exam is slightly motion degraded.  Branch vessel atherosclerotic type changes as noted above.   Original Report Authenticated By: Genia Del, M.D.      Medications: Scheduled: . atorvastatin  20 mg  Oral q1800  . busPIRone  7.5 mg Oral BID  . clonazePAM  0.5 mg Oral BID  . clopidogrel  75 mg Oral Daily  . enoxaparin (LOVENOX) injection  40 mg Subcutaneous Q24H  . glipiZIDE  20 mg Oral BID AC  . hyoscyamine  0.375 mg Oral Daily  . insulin aspart  0-15 Units Subcutaneous TID WC  . insulin aspart  0-5 Units Subcutaneous QHS  . insulin NPH  15 Units Subcutaneous QAC breakfast  . insulin NPH  15 Units Subcutaneous QHS  . irbesartan  150 mg Oral Daily  . levothyroxine  100 mcg Oral QAC breakfast  . lidocaine  2 patch Transdermal Q24H  . pantoprazole  40 mg Oral Daily  . potassium chloride SA  20 mEq Oral Daily  . sodium chloride  3 mL  Intravenous Q12H  . sucralfate  1 g Oral BID   Continuous:    Assessment/Plan: Active Problems:   * No active hospital problems. *   TIA - Remains at baseline.  W/Up (-).  Continue Plavix.  Get Therapy today and go home.  Tele = NSR. CCT (-). Carotid Duplex (Doppler) Preliminary findings: Bilateral: No evidence of hemodynamically significant internal carotid artery stenosis. Vertebral artery flow is antegrade. 2D ECHO - Left ventricle: The cavity size was normal. Systolic function was normal. The estimated ejection fraction was in the range of 55% to 60%. Wall motion was normal; there were no regional wall motion abnormalities. Left ventricular diastolic function parameters were normal. MRI No acute infarct. Mild small vessel disease type changes. Nonspecific 1.1 cm lesion in the right occipital bone posterior to the junction of the right transverse sinus and sigmoid sinus is unchanged. This has well-defined borders and is most likely benign possibly an atypical arachnoid granulation. MRA Exam is slightly motion degraded. Branch vessel atherosclerotic type changes as noted.  RF Modification. Tele has been NSR.   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 2 po bid  Novolin N 100 Unit/ml Susp (Insulin isophane human) .Marland Kitchen... 20 units qam , 10 units qhs  Hgb A1C: 8.7 in 10/2011 and 02/2012  goal is appropriate control with very few lows.  She has been getting frequent ESIs and they have been throwing her CBGs into the terrible range as well.  Adjust where indicated.  NPH and ISS here.   HTN - BP Ok on her current meds of Lasix or HCT, Diovan.  DDD/OA/Spinal Stenosis/LOW BACK PAIN, CHRONIC - tramadol and Lidoderm.  S/P ESI's and was told she cannot have another yet  Resume PT on D/C - next appt is Tuesday  Elevated  Lipids - on Rx -  #s perfect. TC 125. TG 168. HDL 49. LDL 42.  Anxiety/Depression - On mult meds.  Buspirone Hcl 7.5 Mg Tabs (Buspirone hcl) ..... One po bid  Klonopin 0.5 Mg Tabs (Clonazepam) .Marland Kitchen..Marland Kitchen Two po qam and 1 po qhs  Nortriptyline Hcl 50 Mg Caps (Nortriptyline hcl) .Marland Kitchen... 1 po qhs   Morbid Obesity - needs weight loss   Anemia of Chronic Disease - Hbg 11.5 to 10.9 and fine.   OSA should be wearing her CPAP per Dr Lamonte Sakai,  Hypothryroid - on Rx,  02/2012 Cath - Non Ob CAD     Complete Medication List:   Buspirone Hcl 7.5 Mg Tabs (Buspirone hcl) .... One  po bid   Ondansetron Hcl 4 Mg Tabs (Ondansetron hcl) .Marland Kitchen.. 1 po every 8 hours prn   Flonase 50 Mcg/act Susp (Fluticasone propionate) .... 2 sprays each nostril qd   Capsacin .... Apply to both feet for burning when needed   Novolin R 100 .... Injection 3 x a day or more if needed   Ventolin Hfa 108 (90 Base) Mcg/act Aers (Albuterol sulfate) .... 2 puffs bid prn for wheezing   Antivert 25 Mg Tabs (Meclizine hcl) .... One po tid prn vertigo   Hyoscyamine Sulfate Er 0.375 Mg Xr12h-tab (Hyoscyamine sulfate) .... levbid One tab every 12hrs   Novolin R 100 Unit/ml Inj Soln (Insulin regular human) .... 30units qam 20 units lunch 20 units qhs   Glucotrol 10 Mg Tabs (Glipizide) .... 2 po bid   Nexium 40 Mg Cpdr (Esomeprazole magnesium) .Marland Kitchen.. 1 po qd   Klonopin 0.5 Mg Tabs (Clonazepam) .... Two po qam and 1 po qhs   Nortriptyline Hcl 50 Mg Caps (Nortriptyline hcl) .Marland Kitchen.. 1 po qhs   Furosemide 20-40 Mg Tabs (Furosemide) .... One po daily prn fluid/edema   Vesicare 10 Mg Tabs (Solifenacin succinate) .... One po daily    Levoxyl 100 Mcg Tabs (Levothyroxine sodium) .Marland Kitchen.. 1 po qd   Novolin N 100 Unit/ml Susp (Insulin isophane human) .... 20 units qam , 10 units qhs    Klor-con M20 20 Meq Cr-tabs (Potassium chloride crys cr) .Marland Kitchen.. 1 po qd   Crestor 10 Mg Tabs (Rosuvastatin calcium) .Marland Kitchen.. 1 po qd    Plavix 75 Mg Tabs (Clopidogrel bisulfate) .Marland Kitchen.. 1 po qd     Hydrochlorothiazide 12.5 Mg Tabs (Hydrochlorothiazide) .Marland Kitchen.. 1 po qd prn edema   Onetouch Test Strp (Glucose blood) .... Test blood sugar up to 5 times per day    Lidoderm 5 % Ptch (Lidocaine) .... Apply 1 patch 12 hours on and 12 hours off   Bd Insulin Syringe Ultrafine 30g X 1/2" 1 Ml Misc (Insulin syringe-needle u-100) .... Use up to four per day,    Diovan 160 Mg Tabs (Valsartan) .Marland Kitchen.. 1 po qd    Lactulose 10 Gm/91ml Soln (Lactulose) .Marland Kitchen.. 15 cc evry 3 days prn constipation   Tramadol Hcl 50 Mg Tabs (Tramadol hcl) .... One po bid prn pain   Vit C   Colace prn; Miralax/Lactulose prn Constipation   Probiotic   carafate  DVT Prophylaxis   Check UA and work on D/C today   LOS: 1 day   Tihanna Goodson M 04/20/2012, 7:41 AM

## 2012-05-16 ENCOUNTER — Telehealth: Payer: Self-pay | Admitting: *Deleted

## 2012-05-16 NOTE — Telephone Encounter (Signed)
Okay to work in sooner when slot available

## 2012-05-16 NOTE — Telephone Encounter (Signed)
Patient is calling because she has fallen after her stroke,  States she has had right  side weakness and tremors with the fall. Patient thinks she  has had another stroke and needs a 2 month stroke f/u  primary wants her to be seen sooner patient didn't go to the er to get evaluated. Please advise.

## 2012-05-29 ENCOUNTER — Other Ambulatory Visit: Payer: Self-pay | Admitting: Cardiology

## 2012-05-29 ENCOUNTER — Other Ambulatory Visit (HOSPITAL_COMMUNITY): Payer: Self-pay | Admitting: Cardiology

## 2012-05-29 ENCOUNTER — Ambulatory Visit (HOSPITAL_COMMUNITY)
Admission: RE | Admit: 2012-05-29 | Discharge: 2012-05-29 | Disposition: A | Discharge status: Home / Self Care | Payer: Medicare HMO | Source: Ambulatory Visit | Attending: Cardiology | Admitting: Cardiology

## 2012-05-29 DIAGNOSIS — R0602 Shortness of breath: Secondary | ICD-10-CM

## 2012-05-29 DIAGNOSIS — R000 Tachycardia, unspecified: Secondary | ICD-10-CM

## 2012-05-29 DIAGNOSIS — M25561 Pain in right knee: Secondary | ICD-10-CM

## 2012-05-29 DIAGNOSIS — I499 Cardiac arrhythmia, unspecified: Secondary | ICD-10-CM

## 2012-05-29 DIAGNOSIS — M25569 Pain in unspecified knee: Secondary | ICD-10-CM | POA: Insufficient documentation

## 2012-05-29 DIAGNOSIS — M7989 Other specified soft tissue disorders: Secondary | ICD-10-CM | POA: Insufficient documentation

## 2012-05-29 HISTORY — PX: OTHER SURGICAL HISTORY: SHX169

## 2012-05-29 NOTE — Progress Notes (Signed)
Right Lower Ext. Venous Completed. Preliminary report by tech.. Negative for acute DVT. The peroneal veins were poorly visualized in its entirety. Tara Jackson

## 2012-05-30 ENCOUNTER — Ambulatory Visit (HOSPITAL_COMMUNITY)
Admission: RE | Admit: 2012-05-30 | Discharge: 2012-05-30 | Disposition: A | Discharge status: Home / Self Care | Payer: Medicare HMO | Source: Ambulatory Visit | Attending: Cardiology | Admitting: Cardiology

## 2012-05-30 ENCOUNTER — Encounter (HOSPITAL_COMMUNITY)
Admission: RE | Admit: 2012-05-30 | Discharge: 2012-05-30 | Disposition: A | Discharge status: Home / Self Care | Payer: Medicare HMO | Source: Ambulatory Visit | Attending: Cardiology | Admitting: Cardiology

## 2012-05-30 DIAGNOSIS — R0602 Shortness of breath: Secondary | ICD-10-CM

## 2012-05-30 DIAGNOSIS — Z86711 Personal history of pulmonary embolism: Secondary | ICD-10-CM | POA: Insufficient documentation

## 2012-05-30 DIAGNOSIS — I499 Cardiac arrhythmia, unspecified: Secondary | ICD-10-CM

## 2012-05-30 DIAGNOSIS — Z86718 Personal history of other venous thrombosis and embolism: Secondary | ICD-10-CM | POA: Insufficient documentation

## 2012-05-30 DIAGNOSIS — R5383 Other fatigue: Secondary | ICD-10-CM | POA: Insufficient documentation

## 2012-05-30 DIAGNOSIS — R000 Tachycardia, unspecified: Secondary | ICD-10-CM

## 2012-05-30 DIAGNOSIS — R5381 Other malaise: Secondary | ICD-10-CM | POA: Insufficient documentation

## 2012-05-30 DIAGNOSIS — I517 Cardiomegaly: Secondary | ICD-10-CM | POA: Insufficient documentation

## 2012-05-30 DIAGNOSIS — Z8673 Personal history of transient ischemic attack (TIA), and cerebral infarction without residual deficits: Secondary | ICD-10-CM | POA: Insufficient documentation

## 2012-05-30 MED ORDER — TECHNETIUM TO 99M ALBUMIN AGGREGATED
5.4000 | Freq: Once | INTRAVENOUS | Status: AC | PRN
  Administered 2012-05-30: 5 via INTRAVENOUS

## 2012-05-30 MED ORDER — TECHNETIUM TC 99M DIETHYLENETRIAME-PENTAACETIC ACID: 44.0000 | Freq: Once | INTRAVENOUS | Status: DC | PRN

## 2012-06-08 ENCOUNTER — Encounter: Payer: Self-pay | Admitting: Cardiology

## 2012-06-12 ENCOUNTER — Ambulatory Visit (INDEPENDENT_AMBULATORY_CARE_PROVIDER_SITE_OTHER): Payer: Medicare HMO | Admitting: Neurology

## 2012-06-12 ENCOUNTER — Other Ambulatory Visit: Payer: Self-pay | Admitting: *Deleted

## 2012-06-12 ENCOUNTER — Encounter: Payer: Self-pay | Admitting: Neurology

## 2012-06-12 VITALS — BP 143/81 | HR 84 | Temp 98.2°F

## 2012-06-12 DIAGNOSIS — G459 Transient cerebral ischemic attack, unspecified: Secondary | ICD-10-CM

## 2012-06-12 DIAGNOSIS — G250 Essential tremor: Secondary | ICD-10-CM

## 2012-06-12 DIAGNOSIS — G4730 Sleep apnea, unspecified: Secondary | ICD-10-CM

## 2012-06-12 MED ORDER — CLOPIDOGREL BISULFATE 75 MG PO TABS: 75.0000 mg | ORAL_TABLET | Freq: Every day | ORAL | Status: DC

## 2012-06-12 MED ORDER — PROPRANOLOL HCL ER 60 MG PO CP24: 60.0000 mg | ORAL_CAPSULE | Freq: Every day | ORAL | Status: DC

## 2012-06-12 NOTE — Progress Notes (Signed)
[facility]  PATIENT: Trinady Parrillo DOB: Jan 24, 1941   HISTORY FROM: Patient REASON FOR VISIT:follow up following ER visit for TIA, history of CVA  HISTORY OF PRESENT ILLNESS: Kenia "Mae" Wayson is a 72 year old female who was in physical therapy for her cervical spine problems on 04/19/2012 when she was noted to have a right facial droop by therapist and her husband. Her husband brought her to the emergency room for possible stroke. While in the ED her symptoms had fully resolved. Patient continues to have right arm decreased sensation and weakness but states this is residual from her previous stroke and not new. Patient was not a TPA candidate secondary to resolve symptoms. She was admitted or further evaluation and treatment. CT of the brain on 04/19/2012 showed no acute intracranial abnormality and minimal chronic microvascular white matter ischemic changes. MRI of the brain on 04/19/2012 showed no acute infarct. Mild small vessel disease type changes. Nonspecific 1.1 cm lesion in the right occipital bone posterior to the junction of the right transverse sinus and sigmoid sinus is unchanged. This has well-defined borders and is most likely benign possibly an atypical arachnoid granulation. MRA of the brain on 04/19/2012 exam is slightly motion degraded. Branch vessel atherosclerotic type changes. 2-D echocardiogram EF 55-60% with no source of embolus. Carotid Doppler no evidence of hemodynamically significant internal carotid artery stenosis. Vertebral artery flow is antegrade. 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 Coumadin was changed to Plavix after increased liver enzymes on Coumadin.  Patient is seen today for the first office followup visit. She states that  she had a fall at home in her bathroom in which he fell backward and hit her head on the floor and since that time she has developed a tremor in her right hand.   Tremor is intermittent and occurs at rest and with activity, which makes it difficult for her to feed herself. She has not tried any specific medications for the tremor. The tremor does not involve her left hand, legs, voice or head. She has no family history of benign essential tremor. She denies any bradykinesia, drooling of saliva, gait difficulties. She does not drink alcohol and is unaware of its effect, tremor she does have a long-standing history of anxiety and takes buspirone and Klonopin he states that her anxiety levels have not changed significantly recently  REVIEW OF SYSTEMS: Full 14 system review of systems performed and notable only for right hand tremor, fevers/chills, weight loss, fatigue, blurred vision, anemia, easy bruising, memory loss, confusion, numbness.  ALLERGIES: Allergies  Allergen Reactions  . Iodine Other (See Comments)    Makes unconscious  . Metoclopramide Hcl Other (See Comments)    suicidal  . Sulfa Antibiotics Hives  . Iohexol      Code: SOB, Desc: cardiac arrest w/ iv contrast, has never used 13 hr prep//alice calhoun, Onset Date: [id number]   . Lactose Intolerance (Gi) Diarrhea  . Lansoprazole Nausea And Vomiting    HOME MEDICATIONS: Outpatient Prescriptions Prior to Visit  Medication Sig Dispense Refill  . Ascorbic Acid (VITAMIN C) 100 MG tablet Take 100 mg by mouth daily.      . busPIRone (BUSPAR) 7.5 MG tablet Take 7.5 mg by mouth 2 (two) times daily.      . clonazePAM (KLONOPIN) 0.5 MG tablet Take 0.5 mg by mouth 2 (two) times daily. 2 tablets in the am and 1 in the pm      .  docusate sodium (COLACE) 100 MG capsule Take 100 mg by mouth 2 (two) times daily as needed for constipation.       Marland Kitchen esomeprazole (NEXIUM) 40 MG capsule Take 40 mg by mouth daily before breakfast.      . fluticasone (FLONASE) 50 MCG/ACT nasal spray Place 2 sprays into the nose daily as needed for rhinitis or allergies.       . furosemide (LASIX) 20 MG tablet Take 20 mg by mouth  daily as needed. For increased fluid      . glipiZIDE (GLUCOTROL) 10 MG tablet Take 20 mg by mouth 2 (two) times daily before a meal.      . hydrochlorothiazide (MICROZIDE) 12.5 MG capsule Take 12.5 mg by mouth daily as needed. For fluid retention      . insulin NPH (HUMULIN N,NOVOLIN N) 100 UNIT/ML injection Inject 10-20 Units into the skin daily before breakfast. 20 units morning      . insulin NPH (HUMULIN N,NOVOLIN N) 100 UNIT/ML injection Inject 15 Units into the skin at bedtime.  1 vial    . insulin regular (NOVOLIN R,HUMULIN R) 100 units/mL injection Inject 20-30 Units into the skin 3 (three) times daily before meals. 30 units am 20 units at lunch and 20 units pm      . lactulose (CHRONULAC) 10 GM/15ML solution Take 10 g by mouth daily as needed (with miralax).      Marland Kitchen levothyroxine (SYNTHROID, LEVOTHROID) 100 MCG tablet Take 100 mcg by mouth daily.      Marland Kitchen lidocaine (LIDODERM) 5 % Place 2 patches onto the skin daily. Wear patch on 12 hrs = OFF 12 hrs.  **BRAND NAME ONLY**      . meclizine (ANTIVERT) 12.5 MG tablet Take 12.5 mg by mouth 3 (three) times daily as needed. dizziness      . ondansetron (ZOFRAN-ODT) 4 MG disintegrating tablet Take 4 mg by mouth every 8 (eight) hours as needed for nausea.      . polyethylene glycol (MIRALAX / GLYCOLAX) packet Take 17 g by mouth daily as needed (with lactulose). constipation      . potassium chloride SA (K-DUR,KLOR-CON) 20 MEQ tablet Take 20 mEq by mouth daily.      . Probiotic Product (PROBIOTIC PO) Take 1 capsule by mouth daily.       . rosuvastatin (CRESTOR) 20 MG tablet Take 20 mg by mouth daily.      . sucralfate (CARAFATE) 1 G tablet Take 1 g by mouth 2 (two) times daily.      . traMADol-acetaminophen (ULTRACET) 37.5-325 MG per tablet Take 1 tablet by mouth every 6 (six) hours as needed for pain.      . valsartan (DIOVAN) 160 MG tablet Take 160 mg by mouth daily.      . clopidogrel (PLAVIX) 75 MG tablet Take 75 mg by mouth daily.      .  hyoscyamine (LEVBID) 0.375 MG 12 hr tablet Take 0.375 mg by mouth daily.        No facility-administered medications prior to visit.    PAST MEDICAL HISTORY: Past Medical History  Diagnosis Date  . DVT (deep venous thrombosis)   . Asthma   . Hypothyroidism   . Anxiety   . Depression   . Sleep apnea     cpap disontinued  . Stroke     tia and stroke. Weakness Rt hand  . H/O hiatal hernia   . Arthritis   . Anemia   . Complication of  anesthesia     hard to wake up after anesthesia  . LVH (left ventricular hypertrophy) due to hypertensive disease     enlarged heart, mild LVH, EF 55% by echo; no ischemia by stress 01/2010 echo [facility])  . 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 Hyperlipidemia   . Diabetes mellitus type 2, insulin dependent   . Hypertension associated with diabetes   . Sleep apnea   . Glaucoma   . Cataract     PAST SURGICAL HISTORY: Past Surgical History  Procedure Laterality Date  . Abdominal hysterectomy    . Cholecystectomy    . Back surgery      steroid inj  . Tonsillectomy    . Appendectomy    . Esophagogastroduodenoscopy  08/09/2011    Procedure: ESOPHAGOGASTRODUODENOSCOPY (EGD);  Surgeon: Jeryl Columbia, MD;  Location: [facility];  Service: Endoscopy;  Laterality: N/A;  . Hot hemostasis  08/09/2011    Procedure: HOT HEMOSTASIS (ARGON PLASMA COAGULATION/BICAP);  Surgeon: Jeryl Columbia, MD;  Location: [facility];  Service: Endoscopy;  Laterality: N/A;  . Joint replacement      both knees  . Trigger finger release  11/22/2011    Procedure: RELEASE TRIGGER FINGER/A-1 PULLEY;  Surgeon: Meredith Pel, MD;  Location: [facility];  Service: Orthopedics;  Laterality: Left;  Left trigger thumb release  . Eye surgery      FAMILY HISTORY: Family History  Problem Relation Age of Onset  . Cancer Mother   . Cancer - Prostate Father     SOCIAL HISTORY:  History   Social History  .  Marital Status: Married    Spouse Name: N/A    Number of Children: N/A  . Years of Education: N/A   Occupational History  . Not on file.   Social History Main Topics  . Smoking status: Never Smoker   . Smokeless tobacco: Never Used  . Alcohol Use: No  . Drug Use: No  . Sexually Active: Not on file   Other Topics Concern  . Not on file   Social History Narrative  . No narrative on file     PHYSICAL EXAM  Filed Vitals:   06/12/12 1326  BP: 143/81  Pulse: 84  Temp: 98.2 F (36.8 C)   There is no weight on file to calculate BMI.  GENERAL EXAM: Obese African American lady in no distress. Awake, alert, afebrile. Head is nontraumatic. Neck is supple without bruit. Hearing is normal. Cardiac exam no murmur or gallop. Lungs are clear to auscultation.  NEUROLOGIC: MENTAL STATUS: awake, alert, language fluent, comprehension intact, naming intact CRANIAL NERVE: fundi not visualized, pupils equal and reactive to light, visual fields full to confrontation, extraocular muscles intact, no nystagmus, facial sensation and strength symmetric, uvula midline, shoulder shrug symmetric, tongue midline. MOTOR: normal bulk and tone, LUE 5/5, RUE 4/5, LLE 5/5, RLE 4/5. Resting and action right upper extremity coarse tremor which does not diminish with intention. No cogwheel rigidity. No bradykinesia. Glabellar tap is negative. SENSORY: Decreased vibratory sense in the right hand and right lower extremity from ankle to the toes. COORDINATION: finger-nose-finger, fine finger movements decreased right hand REFLEXES: deep tendon reflexes present and symmetric bilat UE, 1+ bilat pateller and achilles GAIT/STATION: unsteady gait; UNABLE to walk on toes, heels and tandem; romberg is negative   DIAGNOSTIC DATA (LABS, IMAGING, TESTING) - I reviewed patient records, labs, notes, testing  and imaging myself where available.  Lab Results  Component Value Date   WBC 6.3 04/20/2012   HGB 10.9* 04/20/2012    HCT 32.2* 04/20/2012   MCV 91.7 04/20/2012   PLT 215 04/20/2012      Component Value Date/Time   NA 138 04/20/2012 0514   K 3.8 04/20/2012 0514   CL 101 04/20/2012 0514   CO2 28 04/20/2012 0514   GLUCOSE 250* 04/20/2012 0514   BUN 15 04/20/2012 0514   CREATININE 1.00 04/20/2012 0514   CALCIUM 8.5 04/20/2012 0514   PROT 6.0 04/19/2012 1228   ALBUMIN 3.4* 04/19/2012 1228   AST 15 04/19/2012 1228   ALT 10 04/19/2012 1228   ALKPHOS 73 04/19/2012 1228   BILITOT 0.5 04/19/2012 1228   GFRNONAA 55* 04/20/2012 0514   GFRAA 63* 04/20/2012 0514   Lab Results  Component Value Date   CHOL 125 04/20/2012   HDL 49 04/20/2012   LDLCALC 42 04/20/2012   TRIG 168* 04/20/2012   CHOLHDL 2.6 04/20/2012   Lab Results  Component Value Date   HGBA1C 8.2* 04/19/2012   No results found for this basename: VITAMINB12   No results found for this basename: TSH     ASSESSMENT   Ms. Gerilynn May Braz is a 72 year old female presenting for followup appointment after recent hospital admission. She presented with right hand weakness right facial droop. Imaging confirmed no acute infarct diagnosis left brain TIA TIA infarct L. to be thrombotic secondary to small vessel disease. On Plavix 75 mg orally daily prior to admission. Has history of liver disease on Coumadin. Now on Plavix 75 mg orally every day for secondary stroke prevention. Patient requests name brand Plavix instead of generic.  Resultant facial weakness, hand weakness resolved. New onset right upper extremity action tremosr is of unclear etiology perhaps a late effect of stroke versus anxiety related  Stroke risk factors include: Obesity History of multiple DVT, PE, etiology unknown, on Coumadin for years, now off due to liver complications Hypertension Hyperlipidemia Diabetes type 2, uncontrolled Sleep apnea   PLAN 1. Patient is bothered by new onset of tremor in right extremity. Will order Inderal LA 60 mg daily if symptoms no better in 2 weeks patient is  to call office and we will increase the dose. 2. Patient may resume physical therapy for gait/balance training. 3. Name brand Plavix was ordered at patient request. 3. Blood Pressure goal is less than 140/90, continue Diovan. 4. Cholesterol goals are less than 200 for total cholesterol and less than 100 for LDL. Continue Crestor. 5. Patient encouraged to lose weight and exercise for 30 minutes 4-5 days per week. 6. Spoke to patient about better control of blood sugar, patient is on insulin. Patient states blood sugars have been hard to control. She states she sometimes has hypoglycemia; most recent Hgba1c is 8.2. 7. Referral made for patient with Dr.Athar. Patient has history of sleep apnea with noncompliance with mask with complaints that it made her nose run. Noncompliance cause Medicare to take CPAP machine. Advised patient that sleep apnea is a significant risk factor for stroke. She agreed to discuss options with Dr. Rexene Alberts. 8. Return for follow up appt in 6 weeks.  Clarke Amburn NP-C 06/12/2012, 2:52 PM  [facility] [address], [place], [place] [address] [phone]  I have personally examined this patient, reviewed pertinent data, developed plan of care and agree with the above  Antony Contras, MD

## 2012-06-12 NOTE — Patient Instructions (Addendum)
Start Inderol LA 60mg  once daily for tremor.  If no better in 2 weeks, call office and we will increase dose.  Resume Physical Therapy.  Name brand Plavix was sent to your pharmacy.  Referral made to sleep MD for sleep apnea.  Follow up in office in 6 weeks with NP.   [place] Cigarette smoking nearly doubles your risk of having a stroke & is the single most alterable risk factor  If you smoke or have smoked in the last 12 months, you are advised to quit smoking for your health.  Most of the excess cardiovascular risk related to smoking disappears within a year of stopping.  Ask you doctor about anti-smoking medications  [place] Quit Line: 1-800-QUIT NOW  Free Smoking Cessation Classes [phone]  CHOLESTEROL Know your levels; limit fat & cholesterol in your diet  Lab Results  Component Value Date   CHOL 125 04/20/2012   HDL 49 04/20/2012   LDLCALC 42 04/20/2012   TRIG 168* 04/20/2012   CHOLHDL 2.6 04/20/2012      Many patients benefit from treatment even if their cholesterol is at goal.  Goal: Total Cholesterol less than 160  Goal:  LDL less than 100  Goal:  HDL greater than 40  Goal:  Triglycerides less than 150  BLOOD PRESSURE American Stroke Association blood pressure target is less that 120/80 mm/Hg  Your discharge blood pressure is:  BP: 143/81 mmHg  Monitor your blood pressure  Limit your salt and alcohol intake  Many individuals will require more than one medication for high blood pressure  DIABETES (A1c is a blood sugar average for last 3 months) Goal A1c is under 7% (A1c is blood sugar average for last 3 months)  Diabetes: Diagnosis of diabetes:  A1c was not drawn this admission    Lab Results  Component Value Date   HGBA1C 8.2* 04/19/2012    Your A1c can be lowered with medications, healthy diet, and exercise.  Check your blood sugar as directed by your physician  Call your physician if you experience unexplained or low blood  sugars.  PHYSICAL ACTIVITY/REHABILITATION Goal is 30 minutes at least 4 days per week    Activity decreases your risk of heart attack and stroke and makes your heart stronger.  It helps control your weight and blood pressure; helps you relax and can improve your mood.  Participate in a regular exercise program.  Talk with your doctor about the best form of exercise for you (dancing, walking, swimming, cycling).  DIET/WEIGHT Goal is to maintain a healthy weight  Your height is:    Your current weight is:   Your body Mass Index (BMI) is:     Following the type of diet specifically designed for you will help prevent another stroke.  Your goal Body Mass Index (BMI) is 19-24.  Healthy food habits can help reduce 3 risk factors for stroke:  High cholesterol, hypertension, and excess weight.

## 2012-06-20 ENCOUNTER — Telehealth: Payer: Self-pay | Admitting: Neurology

## 2012-06-20 NOTE — Telephone Encounter (Signed)
Message copied by Brandon Melnick on Wed Jun 20, 2012  4:38 PM ------      Message from: Rodena Piety      Created: Wed Jun 13, 2012 12:40 PM      Contact: [phone]                   ----- Message -----         From: Benay Pillow         Sent: 06/13/2012  12:36 PM           To: [facility] Clinical Pool            Wants to discuss small vessel disease-[phone] ------

## 2012-06-20 NOTE — Telephone Encounter (Signed)
Pt had called after her appt on 06-12-12 re: small vessel disease.

## 2012-06-22 NOTE — Telephone Encounter (Signed)
I called and LMVM fpr pt yesterday re: if she needed information to call back.

## 2012-06-25 ENCOUNTER — Telehealth: Payer: Self-pay | Admitting: *Deleted

## 2012-06-25 DIAGNOSIS — G459 Transient cerebral ischemic attack, unspecified: Secondary | ICD-10-CM

## 2012-06-25 NOTE — Telephone Encounter (Signed)
I called pt and and let her know about small vessel disease.  She is asking also about plavix (brand) mail order to [facility].   They need prescritpion for this.   Looks like the 06-12-12 prescription sent to  [facility].  Sent to [facility] in [place].   Provider line [phone], member line [phone].  Need to get fax # from then when called.

## 2012-06-26 MED ORDER — CLOPIDOGREL BISULFATE 75 MG PO TABS: 75.0000 mg | ORAL_TABLET | Freq: Every day | ORAL | Status: DC

## 2012-07-13 ENCOUNTER — Telehealth: Payer: Self-pay | Admitting: Cardiology

## 2012-07-13 NOTE — Telephone Encounter (Signed)
Patient informed by Dr. Ellyn Hack that the monitor indicated no HR abnormalities. Patient voiced understanding.

## 2012-07-13 NOTE — Telephone Encounter (Signed)
Wants to know Holter Monitor results-having some irregular hearbeats according to her blood pressure cuff-Please call!

## 2012-07-20 ENCOUNTER — Telehealth: Payer: Self-pay | Admitting: Nurse Practitioner

## 2012-07-20 NOTE — Telephone Encounter (Signed)
Calling patient to reschedule her 07/25/2012-conflict with provider's schedule.

## 2012-07-23 ENCOUNTER — Emergency Department (HOSPITAL_COMMUNITY)
Admission: EM | Admit: 2012-07-23 | Discharge: 2012-07-23 | Disposition: A | Discharge status: Home / Self Care | Payer: Medicare HMO | Attending: Emergency Medicine | Admitting: Emergency Medicine

## 2012-07-23 ENCOUNTER — Emergency Department (HOSPITAL_COMMUNITY): Payer: Medicare HMO

## 2012-07-23 ENCOUNTER — Encounter (HOSPITAL_COMMUNITY): Payer: Self-pay | Admitting: *Deleted

## 2012-07-23 DIAGNOSIS — J45909 Unspecified asthma, uncomplicated: Secondary | ICD-10-CM | POA: Insufficient documentation

## 2012-07-23 DIAGNOSIS — K08109 Complete loss of teeth, unspecified cause, unspecified class: Secondary | ICD-10-CM | POA: Insufficient documentation

## 2012-07-23 DIAGNOSIS — Z794 Long term (current) use of insulin: Secondary | ICD-10-CM | POA: Insufficient documentation

## 2012-07-23 DIAGNOSIS — Z8719 Personal history of other diseases of the digestive system: Secondary | ICD-10-CM | POA: Insufficient documentation

## 2012-07-23 DIAGNOSIS — R51 Headache: Secondary | ICD-10-CM | POA: Insufficient documentation

## 2012-07-23 DIAGNOSIS — R05 Cough: Secondary | ICD-10-CM | POA: Insufficient documentation

## 2012-07-23 DIAGNOSIS — Z8669 Personal history of other diseases of the nervous system and sense organs: Secondary | ICD-10-CM | POA: Insufficient documentation

## 2012-07-23 DIAGNOSIS — Z8679 Personal history of other diseases of the circulatory system: Secondary | ICD-10-CM | POA: Insufficient documentation

## 2012-07-23 DIAGNOSIS — F411 Generalized anxiety disorder: Secondary | ICD-10-CM | POA: Insufficient documentation

## 2012-07-23 DIAGNOSIS — Z7902 Long term (current) use of antithrombotics/antiplatelets: Secondary | ICD-10-CM | POA: Insufficient documentation

## 2012-07-23 DIAGNOSIS — Z79899 Other long term (current) drug therapy: Secondary | ICD-10-CM | POA: Insufficient documentation

## 2012-07-23 DIAGNOSIS — Z86718 Personal history of other venous thrombosis and embolism: Secondary | ICD-10-CM | POA: Insufficient documentation

## 2012-07-23 DIAGNOSIS — D649 Anemia, unspecified: Secondary | ICD-10-CM | POA: Insufficient documentation

## 2012-07-23 DIAGNOSIS — E119 Type 2 diabetes mellitus without complications: Secondary | ICD-10-CM | POA: Insufficient documentation

## 2012-07-23 DIAGNOSIS — R059 Cough, unspecified: Secondary | ICD-10-CM | POA: Insufficient documentation

## 2012-07-23 DIAGNOSIS — Z86711 Personal history of pulmonary embolism: Secondary | ICD-10-CM | POA: Insufficient documentation

## 2012-07-23 DIAGNOSIS — E039 Hypothyroidism, unspecified: Secondary | ICD-10-CM | POA: Insufficient documentation

## 2012-07-23 DIAGNOSIS — F3289 Other specified depressive episodes: Secondary | ICD-10-CM | POA: Insufficient documentation

## 2012-07-23 DIAGNOSIS — M129 Arthropathy, unspecified: Secondary | ICD-10-CM | POA: Insufficient documentation

## 2012-07-23 DIAGNOSIS — R519 Headache, unspecified: Secondary | ICD-10-CM

## 2012-07-23 DIAGNOSIS — I10 Essential (primary) hypertension: Secondary | ICD-10-CM | POA: Insufficient documentation

## 2012-07-23 DIAGNOSIS — E785 Hyperlipidemia, unspecified: Secondary | ICD-10-CM | POA: Insufficient documentation

## 2012-07-23 DIAGNOSIS — G4730 Sleep apnea, unspecified: Secondary | ICD-10-CM | POA: Insufficient documentation

## 2012-07-23 DIAGNOSIS — Z8673 Personal history of transient ischemic attack (TIA), and cerebral infarction without residual deficits: Secondary | ICD-10-CM | POA: Insufficient documentation

## 2012-07-23 DIAGNOSIS — F329 Major depressive disorder, single episode, unspecified: Secondary | ICD-10-CM | POA: Insufficient documentation

## 2012-07-23 LAB — URINALYSIS, ROUTINE W REFLEX MICROSCOPIC
Bilirubin Urine: NEGATIVE
Glucose, UA: NEGATIVE mg/dL
Hgb urine dipstick: NEGATIVE
Ketones, ur: 15 mg/dL — AB
Leukocytes, UA: NEGATIVE
Nitrite: NEGATIVE
Protein, ur: NEGATIVE mg/dL
Specific Gravity, Urine: 1.014 (ref 1.005–1.030)
Urobilinogen, UA: 0.2 mg/dL (ref 0.0–1.0)
pH: 5.5 (ref 5.0–8.0)

## 2012-07-23 LAB — COMPREHENSIVE METABOLIC PANEL
ALT: 6 U/L (ref 0–35)
AST: 14 U/L (ref 0–37)
Albumin: 3.5 g/dL (ref 3.5–5.2)
Alkaline Phosphatase: 83 U/L (ref 39–117)
BUN: 10 mg/dL (ref 6–23)
CO2: 27 mEq/L (ref 19–32)
Calcium: 8.9 mg/dL (ref 8.4–10.5)
Chloride: 97 mEq/L (ref 96–112)
Creatinine, Ser: 1.06 mg/dL (ref 0.50–1.10)
GFR calc Af Amer: 59 mL/min — ABNORMAL LOW (ref >90)
GFR calc non Af Amer: 51 mL/min — ABNORMAL LOW (ref >90)
Glucose, Bld: 214 mg/dL — ABNORMAL HIGH (ref 70–99)
Potassium: 3.7 mEq/L (ref 3.5–5.1)
Sodium: 138 mEq/L (ref 135–145)
Total Bilirubin: 0.5 mg/dL (ref 0.3–1.2)
Total Protein: 6.8 g/dL (ref 6.0–8.3)

## 2012-07-23 LAB — CBC WITH DIFFERENTIAL/PLATELET
Basophils Absolute: 0 10*3/uL (ref 0.0–0.1)
Basophils Relative: 1 % (ref 0–1)
Eosinophils Absolute: 0.2 10*3/uL (ref 0.0–0.7)
Eosinophils Relative: 2 % (ref 0–5)
HCT: 35.2 % — ABNORMAL LOW (ref 36.0–46.0)
Hemoglobin: 11.8 g/dL — ABNORMAL LOW (ref 12.0–15.0)
Lymphocytes Relative: 24 % (ref 12–46)
Lymphs Abs: 2 10*3/uL (ref 0.7–4.0)
MCH: 30.8 pg (ref 26.0–34.0)
MCHC: 33.5 g/dL (ref 30.0–36.0)
MCV: 91.9 fL (ref 78.0–100.0)
Monocytes Absolute: 0.4 10*3/uL (ref 0.1–1.0)
Monocytes Relative: 5 % (ref 3–12)
Neutro Abs: 5.7 10*3/uL (ref 1.7–7.7)
Neutrophils Relative %: 68 % (ref 43–77)
Platelets: 242 10*3/uL (ref 150–400)
RBC: 3.83 MIL/uL — ABNORMAL LOW (ref 3.87–5.11)
RDW: 13 % (ref 11.5–15.5)
WBC: 8.3 10*3/uL (ref 4.0–10.5)

## 2012-07-23 MED ORDER — SODIUM CHLORIDE 0.9 % IV SOLN
Freq: Once | INTRAVENOUS | Status: AC
  Administered 2012-07-23: 11:00:00 via INTRAVENOUS

## 2012-07-23 MED ORDER — PROMETHAZINE HCL 25 MG/ML IJ SOLN
25.0000 mg | Freq: Once | INTRAMUSCULAR | Status: AC
  Administered 2012-07-23: 12.5 mg via INTRAVENOUS
  Filled 2012-07-23: qty 1

## 2012-07-23 MED ORDER — DIPHENHYDRAMINE HCL 50 MG/ML IJ SOLN
25.0000 mg | Freq: Once | INTRAMUSCULAR | Status: AC
  Administered 2012-07-23: 25 mg via INTRAVENOUS
  Filled 2012-07-23: qty 1

## 2012-07-23 MED ORDER — HYDROCODONE-ACETAMINOPHEN 5-325 MG PO TABS: 1.0000 | ORAL_TABLET | ORAL | Status: DC | PRN

## 2012-07-23 MED ORDER — ONDANSETRON HCL 4 MG/2ML IJ SOLN
INTRAMUSCULAR | Status: AC
  Filled 2012-07-23: qty 2

## 2012-07-23 MED ORDER — DEXAMETHASONE SODIUM PHOSPHATE 4 MG/ML IJ SOLN
10.0000 mg | Freq: Once | INTRAMUSCULAR | Status: DC
  Filled 2012-07-23: qty 3

## 2012-07-23 MED ORDER — ONDANSETRON HCL 4 MG/2ML IJ SOLN
4.0000 mg | Freq: Once | INTRAMUSCULAR | Status: AC
Start: 2012-07-23 — End: 2012-07-23
  Administered 2012-07-23: 4 mg via INTRAVENOUS
  Filled 2012-07-23: qty 2

## 2012-07-23 MED ORDER — HYDROMORPHONE HCL PF 1 MG/ML IJ SOLN
1.0000 mg | Freq: Once | INTRAMUSCULAR | Status: AC
  Administered 2012-07-23: 1 mg via INTRAVENOUS
  Filled 2012-07-23: qty 1

## 2012-07-23 NOTE — ED Provider Notes (Signed)
History     CSN: [id number]  Arrival date & time 07/23/12  [id number]   First MD Initiated Contact with Patient 07/23/12 [phone]      Chief Complaint  Patient presents with  . Headache    (Consider location/radiation/quality/duration/timing/severity/associated sxs/prior treatment) Patient is a 72 y.o. female presenting with headaches. The history is provided by the patient. No language interpreter was used.  Headache Location: Pt is a 72 yo woman who has had headaches in the past.  She had dental extraction of upper molar teeth on both sides 6 days ago.  Her headache has been worse since then, felt in both temples.   Radiates to:  Does not radiate Severity at highest:  10/10 Onset quality:  Gradual Duration:  6 days Timing:  Constant Progression:  Unchanged Similar to prior headaches: yes   Context comment:  Recent dental extractions. Relieved by:  Nothing Worsened by:  Nothing tried Ineffective treatments:  None tried Associated symptoms: cough   Associated symptoms: no fever   Associated symptoms comment:  T to 99.7.   Cough:    Cough characteristics: she is spitting up a yellow sputum.     Sputum characteristics:  Yellow   Severity:  Severe   Past Medical History  Diagnosis Date  . DVT (deep venous thrombosis)   . Asthma   . Hypothyroidism   . Anxiety   . Depression   . Sleep apnea     cpap disontinued  . Stroke     tia and stroke. Weakness Rt hand  . H/O hiatal hernia   . Arthritis   . Anemia   . Complication of anesthesia     hard to wake up after anesthesia  . LVH (left ventricular hypertrophy) due to hypertensive disease     enlarged heart, mild LVH, EF 55% by echo; no ischemia by stress 01/2010 echo [facility])  . 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 Hyperlipidemia   . Diabetes mellitus type 2, insulin dependent   . Hypertension associated with diabetes   . Sleep apnea   . Glaucoma    . Cataract     Past Surgical History  Procedure Laterality Date  . Abdominal hysterectomy    . Cholecystectomy    . Back surgery      steroid inj  . Tonsillectomy    . Appendectomy    . Esophagogastroduodenoscopy  08/09/2011    Procedure: ESOPHAGOGASTRODUODENOSCOPY (EGD);  Surgeon: Jeryl Columbia, MD;  Location: [facility];  Service: Endoscopy;  Laterality: N/A;  . Hot hemostasis  08/09/2011    Procedure: HOT HEMOSTASIS (ARGON PLASMA COAGULATION/BICAP);  Surgeon: Jeryl Columbia, MD;  Location: [facility];  Service: Endoscopy;  Laterality: N/A;  . Joint replacement      both knees  . Trigger finger release  11/22/2011    Procedure: RELEASE TRIGGER FINGER/A-1 PULLEY;  Surgeon: Meredith Pel, MD;  Location: [place];  Service: Orthopedics;  Laterality: Left;  Left trigger thumb release  . Eye surgery      Family History  Problem Relation Age of Onset  . Cancer Mother   . Cancer - Prostate Father     History  Substance Use Topics  . Smoking status: Never Smoker   . Smokeless tobacco: Never Used  . Alcohol Use: No    OB History   Grav Para Term Preterm Abortions TAB SAB Ect Mult Living  Review of Systems  Constitutional: Negative for fever and chills.  Respiratory: Positive for cough.   Cardiovascular: Negative.   Gastrointestinal: Negative.   Genitourinary: Negative.   Musculoskeletal: Negative.   Skin: Negative.   Neurological: Positive for headaches.  Psychiatric/Behavioral: Negative.     Allergies  Iodine; Metoclopramide hcl; Sulfa antibiotics; Iohexol; Lactose intolerance (gi); and Lansoprazole  Home Medications   Current Outpatient Rx  Name  Route  Sig  Dispense  Refill  . albuterol (PROVENTIL HFA;VENTOLIN HFA) 108 (90 BASE) MCG/ACT inhaler   Inhalation   Inhale 2 puffs into the lungs every 6 (six) hours as needed for wheezing.         . busPIRone (BUSPAR) 7.5 MG tablet   Oral   Take 7.5 mg by mouth 2 (two) times daily.          . clonazePAM (KLONOPIN) 0.5 MG tablet   Oral   Take 0.5-1 mg by mouth 2 (two) times daily. Takes 2 tablets in every morning and takes 1 tablet every evening.         . clopidogrel (PLAVIX) 75 MG tablet   Oral   Take 1 tablet (75 mg total) by mouth daily.   90 tablet   3     Name Brand Necessary.   . dicyclomine (BENTYL) 10 MG capsule   Oral   Take 10 mg by mouth daily.          Marland Kitchen esomeprazole (NEXIUM) 40 MG capsule   Oral   Take 40 mg by mouth daily before breakfast.         . fluticasone (FLONASE) 50 MCG/ACT nasal spray   Nasal   Place 2 sprays into the nose daily as needed for rhinitis or allergies.          . furosemide (LASIX) 20 MG tablet   Oral   Take 20 mg by mouth daily as needed (for swelling).          Marland Kitchen glipiZIDE (GLUCOTROL) 10 MG tablet   Oral   Take 20 mg by mouth 2 (two) times daily before a meal.         . hydrochlorothiazide (MICROZIDE) 12.5 MG capsule   Oral   Take 12.5 mg by mouth daily.         . insulin NPH (HUMULIN N,NOVOLIN N) 100 UNIT/ML injection   Subcutaneous   Inject 30 Units into the skin daily.          . insulin regular (NOVOLIN R,HUMULIN R) 100 units/mL injection   Subcutaneous   Inject 20 Units into the skin 3 (three) times daily before meals.          Marland Kitchen levothyroxine (SYNTHROID, LEVOTHROID) 100 MCG tablet   Oral   Take 100 mcg by mouth daily.         Marland Kitchen lidocaine (LIDODERM) 5 %   Transdermal   Place 3 patches onto the skin daily. Remove & Discard patch within 12 hours or as directed by MD         . LORazepam (ATIVAN) 1 MG tablet   Oral   Take 1 mg by mouth daily as needed for anxiety.         . meclizine (ANTIVERT) 12.5 MG tablet   Oral   Take 12.5 mg by mouth 3 (three) times daily as needed for dizziness.          . ondansetron (ZOFRAN-ODT) 4 MG disintegrating tablet   Oral   Take 4 mg  by mouth every 8 (eight) hours as needed for nausea.         . potassium chloride SA (K-DUR,KLOR-CON) 20  MEQ tablet   Oral   Take 20 mEq by mouth daily.         . Probiotic Product (PROBIOTIC PO)   Oral   Take 1 capsule by mouth daily.          . propranolol ER (INDERAL LA) 60 MG 24 hr capsule   Oral   Take 1 capsule (60 mg total) by mouth daily.   60 capsule   6   . rosuvastatin (CRESTOR) 20 MG tablet   Oral   Take 20 mg by mouth daily.         . Sennosides (EX-LAX PO)   Oral   Take 1-2 tablets by mouth daily as needed (for constipation).         . sucralfate (CARAFATE) 1 G tablet   Oral   Take 1 g by mouth 2 (two) times daily.         . traMADol (ULTRAM) 50 MG tablet   Oral   Take 50 mg by mouth every 6 (six) hours as needed for pain.         . valsartan (DIOVAN) 160 MG tablet   Oral   Take 160 mg by mouth daily.           BP 136/67  Temp(Src) 98.1 F (36.7 C)  Resp 11  SpO2 98%  Physical Exam  Nursing note and vitals reviewed. Constitutional: She is oriented to person, place, and time. She appears well-developed and well-nourished. No distress.  HENT:  Head: Normocephalic and atraumatic.  Right Ear: External ear normal.  Left Ear: External ear normal.  Mouth/Throat: Oropharynx is clear and moist.  Eyes: Conjunctivae and EOM are normal. Pupils are equal, round, and reactive to light.  Neck: Normal range of motion. Neck supple.  Cardiovascular: Normal rate, regular rhythm and normal heart sounds.   Pulmonary/Chest: Effort normal and breath sounds normal.  Abdominal: Soft. Bowel sounds are normal.  Musculoskeletal: Normal range of motion.  Neurological: She is alert and oriented to person, place, and time.  No sensory or motor deficit.  Skin: Skin is warm and dry.  Psychiatric: She has a normal mood and affect. Her behavior is normal.    ED Course  Procedures (including critical care time)   Results for orders placed during the hospital encounter of 07/23/12  CBC WITH DIFFERENTIAL      Result Value Range   WBC 8.3  4.0 - 10.5 K/uL   RBC  3.83 (*) 3.87 - 5.11 MIL/uL   Hemoglobin 11.8 (*) 12.0 - 15.0 g/dL   HCT 35.2 (*) 36.0 - 46.0 %   MCV 91.9  78.0 - 100.0 fL   MCH 30.8  26.0 - 34.0 pg   MCHC 33.5  30.0 - 36.0 g/dL   RDW 13.0  11.5 - 15.5 %   Platelets 242  150 - 400 K/uL   Neutrophils Relative % 68  43 - 77 %   Neutro Abs 5.7  1.7 - 7.7 K/uL   Lymphocytes Relative 24  12 - 46 %   Lymphs Abs 2.0  0.7 - 4.0 K/uL   Monocytes Relative 5  3 - 12 %   Monocytes Absolute 0.4  0.1 - 1.0 K/uL   Eosinophils Relative 2  0 - 5 %   Eosinophils Absolute 0.2  0.0 - 0.7 K/uL  Basophils Relative 1  0 - 1 %   Basophils Absolute 0.0  0.0 - 0.1 K/uL  COMPREHENSIVE METABOLIC PANEL      Result Value Range   Sodium 138  135 - 145 mEq/L   Potassium 3.7  3.5 - 5.1 mEq/L   Chloride 97  96 - 112 mEq/L   CO2 27  19 - 32 mEq/L   Glucose, Bld 214 (*) 70 - 99 mg/dL   BUN 10  6 - 23 mg/dL   Creatinine, Ser 1.06  0.50 - 1.10 mg/dL   Calcium 8.9  8.4 - 10.5 mg/dL   Total Protein 6.8  6.0 - 8.3 g/dL   Albumin 3.5  3.5 - 5.2 g/dL   AST 14  0 - 37 U/L   ALT 6  0 - 35 U/L   Alkaline Phosphatase 83  39 - 117 U/L   Total Bilirubin 0.5  0.3 - 1.2 mg/dL   GFR calc non Af Amer 51 (*) >90 mL/min   GFR calc Af Amer 59 (*) >90 mL/min   Ct Head Wo Contrast  07/23/2012   *RADIOLOGY REPORT*  Clinical Data:  Headache for 4 days.  Recent dental extraction.  CT HEAD WITHOUT CONTRAST CT MAXILLOFACIAL WITHOUT CONTRAST  Technique:  Multidetector CT imaging of the head and maxillofacial structures were performed using the standard protocol without intravenous contrast. Multiplanar CT image reconstructions of the maxillofacial structures were also generated.  Comparison:  Head CT 04/19/2012  CT HEAD  Findings: No acute intracranial hemorrhage.  No focal mass lesion. No CT evidence of acute infarction.   No midline shift or mass effect.  No hydrocephalus.  Basilar cisterns are patent. Paranasal sinuses and mastoid air cells are clear.  Orbits are normal.  There is  mild mucosal thickening within the maxillary sinuses. Frontal sinuses are clear.  IMPRESSION:  1.  No acute intracranial findings.  To mild mucosal inflammation within the paranasal sinuses.  CT MAXILLOFACIAL  Findings:   There is no abscess within the soft tissues adjacent to the mandible.  No abscess in the deep tissues of the soft base. There is some gas within the maxillary bone at the site of presumed tooth contractions.  No abscess associated with the maxillary bone . There is loss of the bone integrity superior to the extraction sites within the maxillary bone on the left and right best seen on sagittal image 49 and 25.  There appears be communication between the oral cavity in the right maxillary sinus.  There is mucosal inflammation within the left and right maxillary sinuses which is mild-moderate.   IMPRESSION:  1.  No evidence of abscess associated tooth extraction. 2.  Communication between the oral cavity and the nasal maxillary sinus on the left and right but  more prominent on the right.  This is likely related to recent surgery. 3.  Sinusitis within the maxillary sinuses is new from comparison exam of 04/19/2012.   Original Report Authenticated By: Suzy Bouchard, M.D.   Ct Maxillofacial Wo Cm  07/23/2012   *RADIOLOGY REPORT*  Clinical Data:  Headache for 4 days.  Recent dental extraction.  CT HEAD WITHOUT CONTRAST CT MAXILLOFACIAL WITHOUT CONTRAST  Technique:  Multidetector CT imaging of the head and maxillofacial structures were performed using the standard protocol without intravenous contrast. Multiplanar CT image reconstructions of the maxillofacial structures were also generated.  Comparison:  Head CT 04/19/2012  CT HEAD  Findings: No acute intracranial hemorrhage.  No focal mass lesion. No  CT evidence of acute infarction.   No midline shift or mass effect.  No hydrocephalus.  Basilar cisterns are patent. Paranasal sinuses and mastoid air cells are clear.  Orbits are normal.  There is mild  mucosal thickening within the maxillary sinuses. Frontal sinuses are clear.  IMPRESSION:  1.  No acute intracranial findings.  To mild mucosal inflammation within the paranasal sinuses.  CT MAXILLOFACIAL  Findings:   There is no abscess within the soft tissues adjacent to the mandible.  No abscess in the deep tissues of the soft base. There is some gas within the maxillary bone at the site of presumed tooth contractions.  No abscess associated with the maxillary bone . There is loss of the bone integrity superior to the extraction sites within the maxillary bone on the left and right best seen on sagittal image 49 and 25.  There appears be communication between the oral cavity in the right maxillary sinus.  There is mucosal inflammation within the left and right maxillary sinuses which is mild-moderate.   IMPRESSION:  1.  No evidence of abscess associated tooth extraction. 2.  Communication between the oral cavity and the nasal maxillary sinus on the left and right but  more prominent on the right.  This is likely related to recent surgery. 3.  Sinusitis within the maxillary sinuses is new from comparison exam of 04/19/2012.   Original Report Authenticated By: Suzy Bouchard, M.D.     CT maxillofacial shows air in her maxillary sinuses, with communication between the sinuses and the oral cavity, due to her recent dental extractions.   She continues with headache, so will give her rescue medication with Dilaudid and Zofran.    1:56 PM Headache is better.  Released.  Advised to keep appointment with her oral surgeon, Raj Janus, DDS, in 2 days.   1. Headache           Mylinda Latina III, MD 07/23/12 1357

## 2012-07-23 NOTE — ED Notes (Signed)
Pt states that she has had a headache for 4 days. States that she had her upper wisdom teeth removed then as well. Pt states that she has had nausea from the headache. Pt states that she has been taking tramadol with no relief.

## 2012-07-25 ENCOUNTER — Ambulatory Visit: Payer: Medicare HMO | Admitting: Nurse Practitioner

## 2012-08-05 ENCOUNTER — Encounter: Payer: Self-pay | Admitting: *Deleted

## 2012-08-07 ENCOUNTER — Encounter: Payer: Self-pay | Admitting: Cardiology

## 2012-08-07 ENCOUNTER — Ambulatory Visit (INDEPENDENT_AMBULATORY_CARE_PROVIDER_SITE_OTHER): Payer: Medicare HMO | Admitting: Cardiology

## 2012-08-07 VITALS — BP 112/64 | HR 56 | Ht 67.5 in | Wt 275.1 lb

## 2012-08-07 DIAGNOSIS — R079 Chest pain, unspecified: Secondary | ICD-10-CM

## 2012-08-07 DIAGNOSIS — R931 Abnormal findings on diagnostic imaging of heart and coronary circulation: Secondary | ICD-10-CM

## 2012-08-07 DIAGNOSIS — R0602 Shortness of breath: Secondary | ICD-10-CM

## 2012-08-07 DIAGNOSIS — I10 Essential (primary) hypertension: Secondary | ICD-10-CM

## 2012-08-07 DIAGNOSIS — E785 Hyperlipidemia, unspecified: Secondary | ICD-10-CM

## 2012-08-07 DIAGNOSIS — R9439 Abnormal result of other cardiovascular function study: Secondary | ICD-10-CM

## 2012-08-07 MED ORDER — VALSARTAN-HYDROCHLOROTHIAZIDE 80-12.5 MG PO TABS: 1.0000 | ORAL_TABLET | Freq: Every day | ORAL | Status: DC

## 2012-08-07 NOTE — Patient Instructions (Addendum)
Your physician wants you to follow-up in 6 months. You will receive a reminder letter in the mail two months in advance. If you don't receive a letter, please call our office to schedule the follow-up appointment.   Your physician has recommended you make the following change in your medication complete taking diovan 160 mg but take 1/2 tablet a day and complete HCTZ 12.5 MG until bottle is empty. After the 2 bottles are empty start Valsartan/hct 80/12.5 mg daily.

## 2012-08-18 ENCOUNTER — Telehealth: Payer: Self-pay | Admitting: Neurology

## 2012-08-18 DIAGNOSIS — G4733 Obstructive sleep apnea (adult) (pediatric): Secondary | ICD-10-CM

## 2012-08-18 NOTE — Telephone Encounter (Signed)
Marland Kitchen Dr. Antony Contras is referring Tara Jackson, 72 y.o. y/o female, for reevaluation of sleep apnea.  Wt: 275 lbs. Ht: 67.5 in. BMI: 42.43  Diagnoses: Obstructive Sleep Apnea TIA and Stroke Obesity Hypertension Hyperlipidemia Diabetes Type 2 Asthma Hypothyroidism   Medication List: Current Outpatient Prescriptions  Medication Sig Dispense Refill   albuterol (PROVENTIL HFA;VENTOLIN HFA) 108 (90 BASE) MCG/ACT inhaler Inhale 2 puffs into the lungs every 6 (six) hours as needed for wheezing.       busPIRone (BUSPAR) 7.5 MG tablet Take 7.5 mg by mouth 2 (two) times daily.       clonazePAM (KLONOPIN) 0.5 MG tablet Take 0.5-1 mg by mouth 2 (two) times daily. Takes 2 tablets in every morning and takes 1 tablet every evening.       clopidogrel (PLAVIX) 75 MG tablet Take 1 tablet (75 mg total) by mouth daily.  90 tablet  3   dicyclomine (BENTYL) 10 MG capsule Take 10 mg by mouth daily.        esomeprazole (NEXIUM) 40 MG capsule Take 40 mg by mouth daily before breakfast.       flurbiprofen (ANSAID) 100 MG tablet Take 100 mg by mouth 3 (three) times daily.       fluticasone (FLONASE) 50 MCG/ACT nasal spray Place 2 sprays into the nose daily as needed for rhinitis or allergies.        furosemide (LASIX) 20 MG tablet Take 20 mg by mouth daily as needed (for swelling).        glipiZIDE (GLUCOTROL) 10 MG tablet Take 20 mg by mouth 2 (two) times daily before a meal.       HYDROcodone-acetaminophen (NORCO/VICODIN) 5-325 MG per tablet Take 1 tablet by mouth every 4 (four) hours as needed for pain.  20 tablet  0   insulin NPH (HUMULIN N,NOVOLIN N) 100 UNIT/ML injection Inject 30 Units into the skin daily.        insulin regular (NOVOLIN R,HUMULIN R) 100 units/mL injection Inject 20 Units into the skin 3 (three) times daily before meals.        levothyroxine (SYNTHROID, LEVOTHROID) 100 MCG tablet Take 100 mcg by mouth daily.       lidocaine (LIDODERM) 5 % Place 3 patches onto the skin  daily. Remove & Discard patch within 12 hours or as directed by MD       LORazepam (ATIVAN) 1 MG tablet Take 1 mg by mouth daily as needed for anxiety.       meclizine (ANTIVERT) 12.5 MG tablet Take 12.5 mg by mouth 3 (three) times daily as needed for dizziness.        ondansetron (ZOFRAN-ODT) 4 MG disintegrating tablet Take 4 mg by mouth every 8 (eight) hours as needed for nausea.       potassium chloride SA (K-DUR,KLOR-CON) 20 MEQ tablet Take 20 mEq by mouth daily as needed.        Probiotic Product (PROBIOTIC PO) Take 1 capsule by mouth daily.        propranolol ER (INDERAL LA) 60 MG 24 hr capsule Take 1 capsule (60 mg total) by mouth daily.  60 capsule  6   rosuvastatin (CRESTOR) 20 MG tablet Take 20 mg by mouth daily.       Sennosides (EX-LAX PO) Take 1-2 tablets by mouth daily as needed (for constipation).       sucralfate (CARAFATE) 1 G tablet Take 1 g by mouth 2 (two) times daily.  traMADol-acetaminophen (ULTRACET) 37.5-325 MG per tablet Take 1 to 2  Tablets every 6 to 8 hrs as needed for pain       valsartan-hydrochlorothiazide (DIOVAN HCT) 80-12.5 MG per tablet Take 1 tablet by mouth daily.  90 tablet  3   No current facility-administered medications for this visit.    Pt presents to Dr. Leonie Man for stroke/tia follow up.  She carries a diagnosis of osa but was not compliant with CPAP so the machine was taken.  Pt is morbidly obese with htn and hx of stroke.  Per Dr. Leonie Man, patient needs a sleep study for secondary stroke prevention.  Insurance:  [employer] MEDICARE

## 2012-08-19 ENCOUNTER — Encounter: Payer: Self-pay | Admitting: Cardiology

## 2012-08-19 NOTE — Assessment & Plan Note (Addendum)
This was evaluated with cardiac catheterization that failed to show any significant coronary disease. She has chronic PEs but is on Plavix as opposed warfarin due to bleeding issues. I did not see evidence of significantly elevated  Right ventricular pressures on her echocardiograms recently in March. I think this may be due to deconditioning, and obesity. She certainly may have had it distant complement of PEs, does not appear to be active at this time.

## 2012-08-19 NOTE — Assessment & Plan Note (Signed)
Has been evaluated with a stress test that was falsely abnormal. Would recommend not proceeding with further nuclear stress testing in the future.

## 2012-08-19 NOTE — Progress Notes (Signed)
Patient ID: Tara Jackson, female   DOB: 01/23/1941, 72 y.o.   MRN: [id number]  Clinic Note: HPI: Tara Jackson is a 72 y.o. female with a PMH below who presents today for follow-up from her evaluationfor possible atrial fibrillation given a recent TIA.  She wore a monitor for about a month to evaluate palpitations which failed to show any evidence of any arrhythmia.  She had an echocardiogram performed at the time of her 2 that revealed relatively normal findings.  Interval History: I last saw her in January after a heart catheterization on her failed to reveal any significant abnormalities,despite an intermediate risk Myoview stress test with inferior/inferolateral ischemia. There was consideration that her symptoms may be due to microvascular disease given her risk factors. However, there was no evidence of significant knee elevated back into a diastolic pressures, this argues against significant diastolic dysfunction.  As she had been done relatively well and was getting back into exercise until she suffered a TIA back in March. She thinks she have another episode shortly thereafter. She doesn't relate necessarily significant residual symptoms from it. Just feels weak on occasion. She has had dizziness, that she thought may be due to medication. She's been taking a half dose of her Diovan/HCTZ. Since doing that she exhibited less dizziness. She has a little swelling on bilateral ankles to just on the ankles, not involving the feet or calves.  She continues to have intermittent discomfort in her chest that is intermittently associated with exertion, but this is the same symptoms she had prior to her catheterization.  Since her last visit other than the TIA, she denies any shortness with rest or exertion on beyond her baseline dyspnea with any significant exertion. No palpitations of late. No additional TIA symptoms. No amaurosis fugax symptoms. No syncope new specific symptoms. No lightheadedness  or dizziness or wooziness since she reduced her dose of Diovan. No melena, hematochezia or hematuria.  Past Medical History  Diagnosis Date  . DVT (deep venous thrombosis)   . Asthma   . Hypothyroidism   . Anxiety   . Depression   . Sleep apnea     cpap disontinued; test done 07/14/2008 ordered per  [facility]  . Stroke     tia and stroke. Weakness Rt hand  . H/O hiatal hernia   . Arthritis   . Anemia   . Complication of anesthesia     hard to wake up after anesthesia  . LVH (left ventricular hypertrophy) due to hypertensive disease     enlarged heart, mild LVH, EF 55% by echo; no ischemia by stress 01/2010 echo [facility])  . 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 Hyperlipidemia   . Diabetes mellitus type 2, insulin dependent   . Hypertension associated with diabetes   . Sleep apnea   . Glaucoma   . Cataract   . Diabetic neuropathy   . Palpitations     Cardionet monitor - revealed mostly normal sinus rhythm, sinus bradycardia with first-degree A-V block heart rates mostly in the 50s and 60s with some 70s. No arrhythmias, PVCs or PACs noted.  Marland Kitchen TIA (transient ischemic attack) March 2014    Prior Cardiac Evaluation and Past Surgical History: Past Surgical History  Procedure Laterality Date  . Abdominal hysterectomy    . Cholecystectomy    . Back surgery      steroid inj  .  Tonsillectomy    . Appendectomy    . Esophagogastroduodenoscopy  08/09/2011    Procedure: ESOPHAGOGASTRODUODENOSCOPY (EGD);  Surgeon: Jeryl Columbia, MD;  Location: [facility];  Service: Endoscopy;  Laterality: N/A;  . Hot hemostasis  08/09/2011    Procedure: HOT HEMOSTASIS (ARGON PLASMA COAGULATION/BICAP);  Surgeon: Jeryl Columbia, MD;  Location: [facility];  Service: Endoscopy;  Laterality: N/A;  . Joint replacement      both knees  . Trigger finger release  11/22/2011    Procedure: RELEASE TRIGGER FINGER/A-1 PULLEY;  Surgeon: Meredith Pel, MD;  Location: [place];  Service: Orthopedics;  Laterality: Left;  Left trigger thumb release  . Eye surgery    . Cardiac catheterization  02/14/2012    no evidence of obstructive coronary disease ,low EDP with normal EF  . Doppler echocardiography  04/19/2012    EF 0000000; normal diastolic pressures, no regional wall motion motion abnormalities  . Nm myocar perf wall motion  01/26/2012    EF 79%,LV normal,ST SEGMENT CHANGE SUGGESTIVE OF ISCHEMIA.  Eusebio Me doppler  05/29/2012    RIGHT EXTREM. NORMAL VENOUS DUPLEX    Allergies  Allergen Reactions  . Iodine Other (See Comments)    Makes unconscious  . Metoclopramide Hcl Other (See Comments)    suicidal  . Sulfa Antibiotics Hives  . Iohexol      Code: SOB, Desc: cardiac arrest w/ iv contrast, has never used 13 hr prep//alice calhoun, Onset Date: [id number]   . Lactose Intolerance (Gi) Diarrhea  . Lansoprazole Nausea And Vomiting    Current Outpatient Prescriptions  Medication Sig Dispense Refill  . albuterol (PROVENTIL HFA;VENTOLIN HFA) 108 (90 BASE) MCG/ACT inhaler Inhale 2 puffs into the lungs every 6 (six) hours as needed for wheezing.      . busPIRone (BUSPAR) 7.5 MG tablet Take 7.5 mg by mouth 2 (two) times daily.      . clonazePAM (KLONOPIN) 0.5 MG tablet Take 0.5-1 mg by mouth 2 (two) times daily. Takes 2 tablets in every morning and takes 1 tablet every evening.      . clopidogrel (PLAVIX) 75 MG tablet Take 1 tablet (75 mg total) by mouth daily.  90 tablet  3  . dicyclomine (BENTYL) 10 MG capsule Take 10 mg by mouth daily.       Marland Kitchen esomeprazole (NEXIUM) 40 MG capsule Take 40 mg by mouth daily before breakfast.      . flurbiprofen (ANSAID) 100 MG tablet Take 100 mg by mouth 3 (three) times daily.      . fluticasone (FLONASE) 50 MCG/ACT nasal spray Place 2 sprays into the nose daily as needed for rhinitis or allergies.       . furosemide (LASIX) 20 MG tablet Take 20 mg by mouth daily as needed (for swelling).       Marland Kitchen  glipiZIDE (GLUCOTROL) 10 MG tablet Take 20 mg by mouth 2 (two) times daily before a meal.      . HYDROcodone-acetaminophen (NORCO/VICODIN) 5-325 MG per tablet Take 1 tablet by mouth every 4 (four) hours as needed for pain.  20 tablet  0  . insulin NPH (HUMULIN N,NOVOLIN N) 100 UNIT/ML injection Inject 30 Units into the skin daily.       . insulin regular (NOVOLIN R,HUMULIN R) 100 units/mL injection Inject 20 Units into the skin 3 (three) times daily before meals.       Marland Kitchen levothyroxine (SYNTHROID, LEVOTHROID) 100 MCG tablet Take 100 mcg by mouth daily.      Marland Kitchen  lidocaine (LIDODERM) 5 % Place 3 patches onto the skin daily. Remove & Discard patch within 12 hours or as directed by MD      . LORazepam (ATIVAN) 1 MG tablet Take 1 mg by mouth daily as needed for anxiety.      . meclizine (ANTIVERT) 12.5 MG tablet Take 12.5 mg by mouth 3 (three) times daily as needed for dizziness.       . ondansetron (ZOFRAN-ODT) 4 MG disintegrating tablet Take 4 mg by mouth every 8 (eight) hours as needed for nausea.      . potassium chloride SA (K-DUR,KLOR-CON) 20 MEQ tablet Take 20 mEq by mouth daily as needed.       . Probiotic Product (PROBIOTIC PO) Take 1 capsule by mouth daily.       . propranolol ER (INDERAL LA) 60 MG 24 hr capsule Take 1 capsule (60 mg total) by mouth daily.  60 capsule  6  . rosuvastatin (CRESTOR) 20 MG tablet Take 20 mg by mouth daily.      . Sennosides (EX-LAX PO) Take 1-2 tablets by mouth daily as needed (for constipation).      . sucralfate (CARAFATE) 1 G tablet Take 1 g by mouth 2 (two) times daily.      . traMADol-acetaminophen (ULTRACET) 37.5-325 MG per tablet Take 1 to 2  Tablets every 6 to 8 hrs as needed for pain      . valsartan-hydrochlorothiazide (DIOVAN HCT) 80-12.5 MG per tablet Take 1 tablet by mouth daily.  90 tablet  3   No current facility-administered medications for this visit.    History   Social History  . Marital Status: Married    Spouse Name: N/A    Number of  Children: 2  . Years of Education: N/A   Occupational History  . Not on file.   Social History Main Topics  . Smoking status: Never Smoker   . Smokeless tobacco: Never Used  . Alcohol Use: No  . Drug Use: No  . Sexually Active: Not on file   Other Topics Concern  . Not on file   Social History Narrative   Married, mother of 2, grandmother of 5. Is not begin routine exercise    ROS: A comprehensive Review of Systems - Negative except pertinent positives noted above. She denies any residual weakness is from her TIA. She just is alert anxious about it. She denies any GI or GU symptoms. Her anxiety level step stable. The most notable thing is that she just went to the emergency room this past week for severe pain due to the dental abscess. She has baseline constipation that seems to be stable with laxatives.  PHYSICAL EXAM BP 112/64  Pulse 56  Ht 5' 7.5" (1.715 m)  Wt 275 lb 1.6 oz (124.785 kg)  BMI 42.43 kg/m2 General appearance: alert, cooperative, appears stated age, no distress, morbidly obese and mostly truncal obesity. Pleasant mood and affect. Well groomed. Seems to be in better spirits than last visit. Neck: no carotid bruit and no JVD Lungs: clear to auscultation bilaterally, normal percussion bilaterally and nonlabored, good air movement. Heart: regular rate and rhythm, S1, S2 normal, no murmur, click, rub or gallop and normal apical impulse Abdomen: soft, non-tender; bowel sounds normal; no masses,  no organomegaly and truncal obesity noted. Extremities: extremities normal, atraumatic, no cyanosis or edema and she has bilateral medial and lateral malleolus swelling. Does not involve the rest of foot or ankle and does not involve the  calves. Not consistent with edema. Pulses: 2+ and symmetric Neurologic: Grossly normal  [id number] today: No  Recent Studies:[facility] Outpatient Sym[facility]): normal sinus rhythm/sinus bradycardia with with intermittent  first-degree AV block. No ABCs, PACs or atrial fibrillation or other arrhythmias.   ASSESSMENT / PLAN: Stable cardiac standpoint. I don't think her chest pain is anginal in nature. She certainly did not have elevated LVEDP at the time of her catheterization either. Therefore a hard time thinking this is diastolic heart failure causing her symptoms. No evidence of atrial fibrillation on monitor to explain her TIA. Defer further treatment to her primary physician and neurologist.  DYSPNEA -- worsening exertional shortness of breath This was evaluated with cardiac catheterization that failed to show any significant coronary disease. She has chronic PEs but is on Plavix as opposed warfarin due to bleeding issues. I did not see evidence of significantly elevated  Right ventricular pressures on her echocardiograms recently in March. I think this may be due to deconditioning, and obesity. She certainly may have had it distant complement of PEs, does not appear to be active at this time.  Chest pain, exertional -- with shortness of Breath; Has been evaluated with a stress test that was falsely abnormal. Would recommend not proceeding with further nuclear stress testing in the future.  Abnormal nuclear cardiac imaging test -- Intermediate Risk with inferior-inferolateral ischemia. False positive by cardiac catheterization  HYPERTENSION With her symptoms of dizziness I agree with backing off on the dose to one half dose of Diovan to 80 mg. We'll also then combine the Diovan and HCTZ to 80/12.5 mg combination pill daily. Especially the setting of her recent TIA, like to allow for some mild permissive hypertension.  HYPERLIPIDEMIA Followed by her primary physician. She is on Crestor at a stable dose.   Meds ordered this encounter  Medications  . valsartan-hydrochlorothiazide (DIOVAN HCT) 80-12.5 MG per tablet    Sig: Take 1 tablet by mouth daily.    Dispense:  90 tablet    Refill:  3    Followup:  6 months  Ava Deguire W. Ellyn Hack, M.D., M.S. [facility] [address] [place], [place]  [address]  [phone] Pager # [phone]

## 2012-08-19 NOTE — Assessment & Plan Note (Signed)
Followed by her primary physician. She is on Crestor at a stable dose.

## 2012-08-19 NOTE — Assessment & Plan Note (Signed)
False positive by cardiac catheterization

## 2012-08-19 NOTE — Assessment & Plan Note (Addendum)
With her symptoms of dizziness I agree with backing off on the dose to one half dose of Diovan to 80 mg. We'll also then combine the Diovan and HCTZ to 80/12.5 mg combination pill daily. Especially the setting of her recent TIA, like to allow for some mild permissive hypertension.

## 2012-08-21 ENCOUNTER — Ambulatory Visit (INDEPENDENT_AMBULATORY_CARE_PROVIDER_SITE_OTHER): Payer: Medicare HMO | Admitting: Neurology

## 2012-08-21 ENCOUNTER — Ambulatory Visit: Payer: Self-pay | Admitting: *Deleted

## 2012-08-21 ENCOUNTER — Encounter: Payer: Self-pay | Admitting: Neurology

## 2012-08-21 VITALS — BP 141/71 | HR 83 | Temp 97.7°F | Wt 275.0 lb

## 2012-08-21 DIAGNOSIS — M542 Cervicalgia: Secondary | ICD-10-CM

## 2012-08-21 DIAGNOSIS — R51 Headache: Secondary | ICD-10-CM

## 2012-08-21 MED ORDER — PROMETHAZINE HCL 25 MG/ML IJ SOLN: 25.0000 mg | Freq: Once | INTRAMUSCULAR | Status: DC

## 2012-08-21 MED ORDER — TIZANIDINE HCL 4 MG PO CAPS: 4.0000 mg | ORAL_CAPSULE | ORAL | Status: DC

## 2012-08-21 MED ORDER — KETOROLAC TROMETHAMINE 30 MG/ML IJ SOLN: 30.0000 mg | Freq: Once | INTRAMUSCULAR | Status: DC

## 2012-08-21 MED ORDER — PROMETHAZINE HCL 25 MG/ML IJ SOLN
25.0000 mg | Freq: Once | INTRAMUSCULAR | Status: AC
  Administered 2012-08-21: 25 mg via INTRAVENOUS

## 2012-08-21 MED ORDER — VALPROATE SODIUM 100 MG/ML IV SOLN: 1000.0000 mg | Freq: Once | INTRAVENOUS | Status: DC

## 2012-08-21 MED ORDER — KETOROLAC TROMETHAMINE 30 MG/ML IJ SOLN
30.0000 mg | Freq: Once | INTRAMUSCULAR | Status: AC
  Administered 2012-08-21: 30 mg via INTRAVENOUS

## 2012-08-21 MED ORDER — VALPROATE SODIUM 500 MG/5ML IV SOLN
1000.0000 mg | INTRAVENOUS | Status: DC
  Administered 2012-08-21: 1000 mg via INTRAVENOUS

## 2012-08-21 NOTE — Progress Notes (Addendum)
Pt here to see Dr. Leonie Man.  Orders for Toradol, Phenergan and Depacon.  Pt and husband into treatment room.  Made comfortable in recliners.  Allergies:  Iodine, reglan, prevacid, sulfas.  Headache in temples and neck pain.  Level 10.  IV attempted x3, R hand 24g with good blood return taped securely, infusing well.  Tolerated all medications well.  End headache level 6.  Drowsiness after phenergan, pt to wheelchair, and husband driving her home.  NAD.   IV discontinued at 1555.

## 2012-08-21 NOTE — Progress Notes (Signed)
[facility] [address] [place]. [place] [address] [phone]       OFFICE CONSULT NOTE  Ms. Tara Jackson Date of Birth:  October 26, 1940 Medical Record Number:  [id number]   Referring MD:  Mylinda Latina, M.D.  Reason for Referral:  Headache  HPI: Tara Jackson is a 72 year old African lady who developed new-onset chronic daily headaches for the last 4 weeks. She states that she had some dental extractions done of her upper molar teeth about a week ago following which she noticed worsening of her pain which predominantly is in the temporal head regions but does spread all over. She states the pain is constant, severe. At times it is pulsatile. She was seen in the emergency room at [facility] on 07/23/12. She was treated with IV Dilaudid, Phenergan, Zofran and Benadryl in no hopping on the subtotal benefit. CT scan of the maxillary region showed no evidence of abscess associated with a total extraction but showed postoperative changes with communication between the oral cavity and nasal maxillary sinus. There was evidence of sinusitis seen. CT scan of the head showed no acute abnormality. The headache is accompanied by some nausea and light sensitivity and occasional vomiting. The headache remains unchanged throughout the day. She is currently being treated with antibiotic course for a swollen gums. She has tried oxycodone, Ultram and Tylenol which have not helped. She does also have history of degenerative cervical spine disease he does complain of neck pain and muscle tightness and spasm as well. She has had 4 minor falls since her last visit 2 months ago. She even had a brief episode of blacking out yesterday but her husband caught her. She found that her blood pressure was low and her primary care physician has reduced her blood pressure medication since then.  She denies any prior history of migraine headaches of similar headaches in the past. She denies any blurred vision or  loss of vision accompanying her headaches. 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 ROS:   14 system review of systems is positive for fatigue, ringing in the ears, spinning sensation, wheezing, constipation, feeling cold, runny nose, insomnia, headache, passing out, depression, decreased energy and change in appetite and low blood pressure.  PMH:  Past Medical History  Diagnosis Date  . DVT (deep venous thrombosis)   . Asthma   . Hypothyroidism   . Anxiety   . Depression   . Sleep apnea     cpap disontinued; test done 07/14/2008 ordered per  [facility]  . Stroke     tia and stroke. Weakness Rt hand  . H/O hiatal hernia   . Arthritis   . Anemia   . Complication of anesthesia     hard to wake up after anesthesia  . LVH (left ventricular hypertrophy) due to hypertensive disease     enlarged heart, mild LVH, EF 55% by echo; no ischemia by stress 01/2010 echo [facility])  . 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 Hyperlipidemia   . Diabetes mellitus type 2, insulin dependent   . Hypertension associated with diabetes   . Sleep apnea   . Glaucoma   . Cataract   . Diabetic neuropathy   . Palpitations     Cardionet monitor - revealed  mostly normal sinus rhythm, sinus bradycardia with first-degree A-V block heart rates mostly in the 50s and 60s with some 70s. No arrhythmias, PVCs or PACs noted.  Marland Kitchen TIA (transient ischemic attack) March 2014    Social History:  History   Social History  . Marital Status: Married    Spouse Name: N/A    Number of Children: 2  . Years of Education: N/A   Occupational History  . Not on file.   Social History Main Topics  . Smoking status: Never Smoker   . Smokeless tobacco: Never Used  . Alcohol Use: No  . Drug Use: No  . Sexually Active: Not on file   Other Topics Concern   . Not on file   Social History Narrative   Married, mother of 2, grandmother of 51. Is not begin routine exercise    Medications:   Current Outpatient Prescriptions on File Prior to Visit  Medication Sig Dispense Refill  . albuterol (PROVENTIL HFA;VENTOLIN HFA) 108 (90 BASE) MCG/ACT inhaler Inhale 2 puffs into the lungs every 6 (six) hours as needed for wheezing.      . busPIRone (BUSPAR) 7.5 MG tablet Take 7.5 mg by mouth 2 (two) times daily.      . clonazePAM (KLONOPIN) 0.5 MG tablet Take 0.5-1 mg by mouth 2 (two) times daily. Takes 2 tablets in every morning and takes 1 tablet every evening.      . clopidogrel (PLAVIX) 75 MG tablet Take 1 tablet (75 mg total) by mouth daily.  90 tablet  3  . dicyclomine (BENTYL) 10 MG capsule Take 10 mg by mouth daily.       Marland Kitchen esomeprazole (NEXIUM) 40 MG capsule Take 40 mg by mouth daily before breakfast.      . flurbiprofen (ANSAID) 100 MG tablet Take 100 mg by mouth 3 (three) times daily.      . fluticasone (FLONASE) 50 MCG/ACT nasal spray Place 2 sprays into the nose daily as needed for rhinitis or allergies.       . furosemide (LASIX) 20 MG tablet Take 20 mg by mouth daily as needed (for swelling).       Marland Kitchen glipiZIDE (GLUCOTROL) 10 MG tablet Take 20 mg by mouth 2 (two) times daily before a meal.      . HYDROcodone-acetaminophen (NORCO/VICODIN) 5-325 MG per tablet Take 1 tablet by mouth every 4 (four) hours as needed for pain.  20 tablet  0  . insulin NPH (HUMULIN N,NOVOLIN N) 100 UNIT/ML injection Inject 30 Units into the skin daily.       . insulin regular (NOVOLIN R,HUMULIN R) 100 units/mL injection Inject 20 Units into the skin 3 (three) times daily before meals.       Marland Kitchen levothyroxine (SYNTHROID, LEVOTHROID) 100 MCG tablet Take 100 mcg by mouth daily.      Marland Kitchen lidocaine (LIDODERM) 5 % Place 3 patches onto the skin daily. Remove & Discard patch within 12 hours or as directed by MD      . LORazepam (ATIVAN) 1 MG tablet Take 1 mg by mouth daily as needed  for anxiety.      . meclizine (ANTIVERT) 12.5 MG tablet Take 12.5 mg by mouth 3 (three) times daily as needed for dizziness.       . ondansetron (ZOFRAN-ODT) 4 MG disintegrating tablet Take 4 mg by mouth every 8 (eight) hours as needed for nausea.      . potassium chloride SA (K-DUR,KLOR-CON) 20 MEQ tablet Take 20 mEq  by mouth daily as needed.       . Probiotic Product (PROBIOTIC PO) Take 1 capsule by mouth daily.       . propranolol ER (INDERAL LA) 60 MG 24 hr capsule Take 1 capsule (60 mg total) by mouth daily.  60 capsule  6  . rosuvastatin (CRESTOR) 20 MG tablet Take 20 mg by mouth daily.      . Sennosides (EX-LAX PO) Take 1-2 tablets by mouth daily as needed (for constipation).      . sucralfate (CARAFATE) 1 G tablet Take 1 g by mouth 2 (two) times daily.      . traMADol-acetaminophen (ULTRACET) 37.5-325 MG per tablet Take 1 to 2  Tablets every 6 to 8 hrs as needed for pain      . valsartan-hydrochlorothiazide (DIOVAN HCT) 80-12.5 MG per tablet Take 1 tablet by mouth daily.  90 tablet  3   No current facility-administered medications on file prior to visit.    Allergies:   Allergies  Allergen Reactions  . Iodine Other (See Comments)    Makes unconscious  . Metoclopramide Hcl Other (See Comments)    suicidal  . Sulfa Antibiotics Hives  . Iohexol      Code: SOB, Desc: cardiac arrest w/ iv contrast, has never used 13 hr prep//alice calhoun, Onset Date: [id number]   . Lactose Intolerance (Gi) Diarrhea  . Lansoprazole Nausea And Vomiting   Filed Vitals:   08/21/12 1328  BP: 141/71  Pulse: 83  Temp: 97.7 F (36.5 C)    Physical Exam General: Obese middle-age African American lady in distress from headache, seated,   Head: head normocephalic and atraumatic. Orohparynx benign Neck: supple with no carotid or supraclavicular bruits Cardiovascular: regular rate and rhythm, no murmurs Musculoskeletal: no deformity Skin:  no rash/petichiae Vascular:  Normal pulses all  extremities  Neurologic Exam Mental Status: Awake and fully alert. Oriented to place and time. Recent and remote memory intact. Attention span, concentration and fund of knowledge appropriate. Mood and affect appropriate.  Cranial Nerves: Fundoscopic exam reveals sharp disc margins. Pupils equal, briskly reactive to light. Extraocular movements full without nystagmus. Visual fields full to confrontation. Hearing intact. Facial sensation intact. Face, tongue, palate moves normally and symmetrically.  Motor: Normal bulk and tone. Normal strength in all tested extremity muscles. Mild action tremor of the right hand an outstretched. Sensory.: intact to tough and pinprick and vibratory.  Coordination: Rapid alternating movements normal in all extremities. Finger-to-nose and heel-to-shin performed accurately bilaterally. Gait and Station: Arises from chair without difficulty. Stance is normal. Gait demonstrates normal stride length and balance . Able to heel, toe and tandem walk without difficulty.  Reflexes: 1+ and symmetric. Toes downgoing.     ASSESSMENT:  72 year old lady with new-onset refractory chronic daily headache for the last 4 weeks following dental extraction. The headache has mixed vascular and tension headache features. Prior history of left brain TIA he March 2014 with vascular risk factors of sleep apnea, diabetes, hypertension, hyperlipidemia, obesity.   PLAN: We will treat the patient's headache with IV Toradol 30 mg, Phenergan 12.5 mg followed by Depacon 100mg  for rescue if no relief. Start Zanaflex 4 mg at night for one week increase it or if it twice daily to help with muscle tension component. I have advised her to do daily next exercises. Check MRI scan of the brain and cervical spine to the evaluate for structural and vascular lesions. Return for followup in 3 months with Charlott Holler, NP

## 2012-08-21 NOTE — Patient Instructions (Signed)
Pt to home to rest, husband driving.  Medications e scribed to [facility] at [place] by Dr. Leonie Man.

## 2012-08-21 NOTE — Telephone Encounter (Signed)
I entered split night request. Is there a way, I can route the encounter back to you? Or is this way good enough? Michel Bickers

## 2012-08-21 NOTE — Patient Instructions (Signed)
Plan to treat her refractory headache with Toradol and Phenergan in the office and if not effective IV Depacon. Start Zanaflex 4 mg at night for one week increase to twice daily and subsequently thereafter as tolerated. Have discussed possible side effects the patient and husband advised him to call me. Check MRI scan of the brain and neck for structural and vascular etiologies. I have advised her to do daily neck stretching exercises as well. Return for followup in 3 months with Jeani Hawking, nurse practitioner and call earlier as needed

## 2012-08-21 NOTE — Progress Notes (Signed)
I reviewed the patient's sleep study request and I believe it is medically necessary that she be reevaluated for obstructive sleep apnea and treated. To that end, I will order a split-night sleep study.

## 2012-08-22 ENCOUNTER — Other Ambulatory Visit: Payer: Self-pay

## 2012-08-22 DIAGNOSIS — R51 Headache: Secondary | ICD-10-CM

## 2012-08-22 MED ORDER — TIZANIDINE HCL 4 MG PO CAPS: ORAL_CAPSULE | ORAL | Status: DC

## 2012-08-30 ENCOUNTER — Ambulatory Visit
Admission: RE | Admit: 2012-08-30 | Discharge: 2012-08-30 | Disposition: A | Discharge status: Home / Self Care | Payer: Medicare HMO | Source: Ambulatory Visit | Attending: Neurology | Admitting: Neurology

## 2012-08-30 DIAGNOSIS — R51 Headache: Secondary | ICD-10-CM

## 2012-08-30 DIAGNOSIS — M542 Cervicalgia: Secondary | ICD-10-CM

## 2012-08-30 MED ORDER — GADOBENATE DIMEGLUMINE 529 MG/ML IV SOLN
20.0000 mL | Freq: Once | INTRAVENOUS | Status: AC | PRN
  Administered 2012-08-30: 20 mL via INTRAVENOUS

## 2012-08-31 NOTE — Telephone Encounter (Signed)
Please explained to patient that it depends on how long ago she was treated with CPAP. Sometimes it has to do with the machine, sometimes it has to do with the amount of humidification she was using with the machine. We can help tweak that. We can also determine whether she needs the same kind of pressure she needed before and sometimes reducing pressure helps as well or increasing pressure for that matter. There is a lot of factors at play and given her history and age I would not suggest that she would be a good surgical candidate for sleep apnea treatment as a general comments. I would suggest that we go ahead and proceed with a split-night sleep study and take it from there with the idea that we can hopefully help her with the nasal drip and nasal congestion down the Road.

## 2012-08-31 NOTE — Telephone Encounter (Signed)
I called this patient to set up her appt for the sleep study and in speaking with her she indicates that she has had a sleep study prior.  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  She had such severe nasal drip caused by the cpap that she was simply unable to use the machine.  She says that she is not resistant to repeating the study but the nasal drip caused by cpap is intolerable.  Please advise, thanks!

## 2012-09-03 ENCOUNTER — Telehealth: Payer: Self-pay | Admitting: Neurology

## 2012-09-04 NOTE — Telephone Encounter (Signed)
Pharmacist returned my call.  He said they went through patients med list and they do not see any medication that needs auth.  They said the patient just picked up Tizanidine on the 16th and that is the last thing they show they have filled.  No meds are rejecting saying cert is needed.  I called the patient back.  Got no answer.  Left message saying pharmacy says they do not show any meds need precert.  Asked her if she is still having problems, please call us back with the med name so we can look further into this.

## 2012-09-20 ENCOUNTER — Ambulatory Visit: Payer: Medicare HMO

## 2012-09-20 DIAGNOSIS — G479 Sleep disorder, unspecified: Secondary | ICD-10-CM

## 2012-09-20 DIAGNOSIS — R0989 Other specified symptoms and signs involving the circulatory and respiratory systems: Secondary | ICD-10-CM

## 2012-09-20 DIAGNOSIS — G4761 Periodic limb movement disorder: Secondary | ICD-10-CM

## 2012-09-20 DIAGNOSIS — R0609 Other forms of dyspnea: Secondary | ICD-10-CM

## 2012-10-03 ENCOUNTER — Telehealth: Payer: Self-pay | Admitting: Neurology

## 2012-10-03 NOTE — Telephone Encounter (Signed)
Please call and notify the patient that the recent sleep study did not show any significant obstructive sleep apnea. Please inform patient that she can FU with Dr. Leonie Man as planned and we will mail her a copy of the results. She did have significant leg kicking and Dr. Leonie Man can go over the details of the study during her follow up appointment. Also, route or fax report to PCP and Dr. Leonie Man. Once you have spoken to patient, you can close this encounter.   Thanks,  Star Age, MD, PhD [facility])

## 2012-10-09 ENCOUNTER — Other Ambulatory Visit: Payer: Self-pay | Admitting: *Deleted

## 2012-10-09 ENCOUNTER — Encounter: Payer: Self-pay | Admitting: *Deleted

## 2012-10-09 DIAGNOSIS — G4733 Obstructive sleep apnea (adult) (pediatric): Secondary | ICD-10-CM

## 2012-10-09 NOTE — Telephone Encounter (Signed)
Called patient to discuss sleep study results.  Discussed findings, recommendations and follow up care.  Patient understood well and all questions were answered.   She is comfortable following up with Dr. Leonie Man for the PLMD and would also like a copy of the report to be faxed to Dr. Virgina Jock.  She mentions she does have a history of spinal stenosis which may contribute to her PLM's.

## 2012-11-15 ENCOUNTER — Ambulatory Visit (INDEPENDENT_AMBULATORY_CARE_PROVIDER_SITE_OTHER): Payer: Medicare HMO | Admitting: Nurse Practitioner

## 2012-11-15 ENCOUNTER — Encounter (INDEPENDENT_AMBULATORY_CARE_PROVIDER_SITE_OTHER): Payer: Self-pay

## 2012-11-15 ENCOUNTER — Encounter: Payer: Self-pay | Admitting: Nurse Practitioner

## 2012-11-15 VITALS — BP 118/63 | HR 52 | Temp 98.5°F | Ht 65.0 in | Wt 275.0 lb

## 2012-11-15 DIAGNOSIS — G2581 Restless legs syndrome: Secondary | ICD-10-CM

## 2012-11-15 DIAGNOSIS — R51 Headache: Secondary | ICD-10-CM

## 2012-11-15 MED ORDER — CLONAZEPAM 0.5 MG PO TABS: 1.0000 mg | ORAL_TABLET | Freq: Two times a day (BID) | ORAL | Status: DC

## 2012-11-15 NOTE — Patient Instructions (Signed)
Continue Zanaflex daily to help with muscle tension component of headaches. I have advised her to do daily mild neck exercises.   Take 2 Klonopin 0.5 mg tablets at night for restless less.  Return for followup in 3 months with Tara Holler, NP  Restless Legs Syndrome Restless legs syndrome is a movement disorder. It may also be called a sensori-motor disorder.  CAUSES  No one knows what specifically causes restless legs syndrome, but it tends to run in families. It is also more common in people with low iron, in pregnancy, in people who need dialysis, and those with nerve damage (neuropathy).Some medications may make restless legs syndrome worse.Those medications include drugs to treat high blood pressure, some heart conditions, nausea, colds, allergies, and depression. SYMPTOMS Symptoms include uncomfortable sensations in the legs. These leg sensations are worse during periods of inactivity or rest. They are also worse while sitting or lying down. Individuals that have the disorder describe sensations in the legs that feel like:  Pulling.  Drawing.  Crawling.  Worming.  Boring.  Tingling.  Pins and needles.  Prickling.  Pain. The sensations are usually accompanied by an overwhelming urge to move the legs. Sudden muscle jerks may also occur. Movement provides temporary relief from the discomfort. In rare cases, the arms may also be affected. Symptoms may interfere with going to sleep (sleep onset insomnia). Restless legs syndrome may also be related to periodic limb movement disorder (PLMD). PLMD is another more common motor disorder. It also causes interrupted sleep. The symptoms from PLMD usually occur most often when you are awake. TREATMENT  Treatment for restless legs syndrome is symptomatic. This means that the symptoms are treated.   Massage and cold compresses may provide temporary relief.  Walk, stretch, or take a cold or hot bath.  Get regular exercise and a good night's  sleep.  Avoid caffeine, alcohol, nicotine, and medications that can make it worse.  Do activities that provide mental stimulation like discussions, needlework, and video games. These may be helpful if you are not able to walk or stretch. Some medications are effective in relieving the symptoms. However, many of these medications have side effects. Ask your caregiver about medications that may help your symptoms. Correcting iron deficiency may improve symptoms for some patients. Document Released: 01/14/2002 Document Revised: 04/18/2011 Document Reviewed: 04/22/2010 [facility] Patient Information 2014 [place].

## 2012-11-15 NOTE — Progress Notes (Signed)
[facility]  PATIENT: Tara Jackson DOB: 1940/10/19   REASON FOR VISIT: follow up HISTORY FROM: patient  HISTORY OF PRESENT ILLNESS: [name] is a 72 year old African lady who developed new-onset chronic daily headaches for the last 4 weeks. She states that she had some dental extractions done of her upper molar teeth about a week ago following which she noticed worsening of her pain which predominantly is in the temporal head regions but does spread all over. She states the pain is constant, severe. At times it is pulsatile. She was seen in the emergency room at [facility] on 07/23/12. She was treated with IV Dilaudid, Phenergan, Zofran and Benadryl in no hopping on the subtotal benefit. CT scan of the maxillary region showed no evidence of abscess associated with a total extraction but showed postoperative changes with communication between the oral cavity and nasal maxillary sinus. There was evidence of sinusitis seen. CT scan of the head showed no acute abnormality. The headache is accompanied by some nausea and light sensitivity and occasional vomiting. The headache remains unchanged throughout the day. She is currently being treated with antibiotic course for a swollen gums. She has tried oxycodone, Ultram and Tylenol which have not helped. She does also have history of degenerative cervical spine disease she does complain of neck pain and muscle tightness and spasm as well. She has had 4 minor falls since her last visit 2 months ago. She even had a brief episode of blacking out yesterday but her husband caught her. She found that her blood pressure was low and her primary care physician has reduced her blood pressure medication since then. She denies any prior history of migraine headaches of similar headaches in the past. She denies any blurred vision or loss of vision accompanying her headaches.  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   UPDATE 11/15/12 (LL): [name] comes to office for revisit for daily headache after wisdom tooth extraction.  MRI cervical spine shows prominent spondylitic changes mostly at C5-6 and C4-5. with lateral disc osteophyte protrusions resulting in moderate foraminal narrowing on the right and mild canal stenosis without definite compression.  MRI brain showed mild changes of microvascular ischemia.  Advanced changes of chronic paranasal sinusitis.  She states that her headaches are much better, about 84 % better than last visit.  She does not have a headache today.   She is using Tizanadine 4 mg for neck muscle tension.  She had a recent sleep study which showed snoring but AHI within normal limits.  Restless legs syndrome was suggested due to frequent leg movements.  She states at night sometimes she feels like something is crawling on her lower legs and she feels like she must move them.  ROS:  14 system review of systems is positive for ringing in the ears, short of breath, wheezing, snoring, constipation, feeling hot, feeling cold, increased thirst, flushing, allergies, itching, confusion, headache, weakness, dizziness, anxiety,  decreased energy and change in appetite, disinterest in activities, and hallucinations when she has  low blood sugar.  ALLERGIES: Allergies  Allergen Reactions  . Iodine Other (See Comments)    Makes unconscious  . Metoclopramide Hcl Other (See Comments)    suicidal  . Sulfa Antibiotics Hives  . Iohexol      Code: SOB, Desc: cardiac arrest w/ iv contrast,  has never used 13 hr prep//alice calhoun, Onset Date: [id number]   . Lactose Intolerance (Gi) Diarrhea  . Lansoprazole Nausea And Vomiting    HOME MEDICATIONS: Outpatient Prescriptions Prior to Visit  Medication Sig Dispense Refill  . albuterol (PROVENTIL HFA;VENTOLIN HFA) 108 (90 BASE) MCG/ACT inhaler Inhale 2 puffs into the lungs every  6 (six) hours as needed for wheezing.      Marland Kitchen amoxicillin-clavulanate (AUGMENTIN) 875-125 MG per tablet       . busPIRone (BUSPAR) 7.5 MG tablet Take 7.5 mg by mouth 2 (two) times daily.      . clopidogrel (PLAVIX) 75 MG tablet Take 1 tablet (75 mg total) by mouth daily.  90 tablet  3  . dicyclomine (BENTYL) 10 MG capsule Take 10 mg by mouth daily.       Marland Kitchen DIOVAN 160 MG tablet       . esomeprazole (NEXIUM) 40 MG capsule Take 40 mg by mouth daily before breakfast.      . flurbiprofen (ANSAID) 100 MG tablet Take 100 mg by mouth 3 (three) times daily.      . fluticasone (FLONASE) 50 MCG/ACT nasal spray Place 2 sprays into the nose daily as needed for rhinitis or allergies.       . furosemide (LASIX) 20 MG tablet Take 20 mg by mouth daily as needed (for swelling).       Marland Kitchen glipiZIDE (GLUCOTROL) 10 MG tablet Take 20 mg by mouth 2 (two) times daily before a meal.      . HYDROcodone-acetaminophen (NORCO/VICODIN) 5-325 MG per tablet Take 1 tablet by mouth every 4 (four) hours as needed for pain.  20 tablet  0  . insulin NPH (HUMULIN N,NOVOLIN N) 100 UNIT/ML injection Inject 30 Units into the skin daily.       . insulin regular (NOVOLIN R,HUMULIN R) 100 units/mL injection Inject 20 Units into the skin 3 (three) times daily before meals.       Marland Kitchen levothyroxine (SYNTHROID, LEVOTHROID) 100 MCG tablet Take 100 mcg by mouth daily.      Marland Kitchen lidocaine (LIDODERM) 5 % Place 3 patches onto the skin daily. Remove & Discard patch within 12 hours or as directed by MD      . LORazepam (ATIVAN) 1 MG tablet Take 1 mg by mouth daily as needed for anxiety.      . meclizine (ANTIVERT) 12.5 MG tablet Take 12.5 mg by mouth 3 (three) times daily as needed for dizziness.       . ondansetron (ZOFRAN-ODT) 4 MG disintegrating tablet Take 4 mg by mouth every 8 (eight) hours as needed for nausea.      . potassium chloride SA (K-DUR,KLOR-CON) 20 MEQ tablet Take 20 mEq by mouth daily as needed.       . Probiotic Product (PROBIOTIC PO) Take  1 capsule by mouth daily.       . propranolol ER (INDERAL LA) 60 MG 24 hr capsule Take 1 capsule (60 mg total) by mouth daily.  60 capsule  6  . rosuvastatin (CRESTOR) 20 MG tablet Take 20 mg by mouth daily.      . Sennosides (EX-LAX PO) Take 1-2 tablets by mouth daily as needed (for constipation).      . sucralfate (CARAFATE) 1 G tablet Take 1 g by mouth 2 (two) times daily.      Marland Kitchen tiZANidine (ZANAFLEX) 4 MG capsule Take one tablet at night x 1 week then twice daily  60 capsule  3  .  traMADol-acetaminophen (ULTRACET) 37.5-325 MG per tablet Take 1 to 2  Tablets every 6 to 8 hrs as needed for pain      . clonazePAM (KLONOPIN) 0.5 MG tablet Take 0.5-1 mg by mouth 2 (two) times daily. Takes 2 tablets in every morning and takes 1 tablet every evening.      . valsartan-hydrochlorothiazide (DIOVAN HCT) 80-12.5 MG per tablet Take 1 tablet by mouth daily.  90 tablet  3   Facility-Administered Medications Prior to Visit  Medication Dose Route Frequency Provider Last Rate Last Dose  . valproate (DEPACON) 1,000 mg in sodium chloride 0.9 % 100 mL IVPB  1,000 mg Intravenous Continuous Garvin Fila, MD   1,000 mg at 08/21/12 1519    PAST MEDICAL HISTORY: Past Medical History  Diagnosis Date  . DVT (deep venous thrombosis)   . Asthma   . Hypothyroidism   . Anxiety   . Depression   . Sleep apnea     cpap disontinued; test done 07/14/2008 ordered per  Byrum  . Stroke     tia and stroke. Weakness Rt hand  . H/O hiatal hernia   . Arthritis   . Anemia   . Complication of anesthesia     hard to wake up after anesthesia  . LVH (left ventricular hypertrophy) due to hypertensive disease     enlarged heart, mild LVH, EF 55% by echo; no ischemia by stress 01/2010 echo [facility])  . 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 Hyperlipidemia   . Diabetes mellitus type 2, insulin dependent   . Hypertension associated with diabetes   .  Sleep apnea   . Glaucoma   . Cataract   . Diabetic neuropathy   . Palpitations     Cardionet monitor - revealed mostly normal sinus rhythm, sinus bradycardia with first-degree A-V block heart rates mostly in the 50s and 60s with some 70s. No arrhythmias, PVCs or PACs noted.  Marland Kitchen TIA (transient ischemic attack) March 2014    PAST SURGICAL HISTORY: Past Surgical History  Procedure Laterality Date  . Abdominal hysterectomy    . Cholecystectomy    . Back surgery      steroid inj  . Tonsillectomy    . Appendectomy    . Esophagogastroduodenoscopy  08/09/2011    Procedure: ESOPHAGOGASTRODUODENOSCOPY (EGD);  Surgeon: Jeryl Columbia, MD;  Location: [facility];  Service: Endoscopy;  Laterality: N/A;  . Hot hemostasis  08/09/2011    Procedure: HOT HEMOSTASIS (ARGON PLASMA COAGULATION/BICAP);  Surgeon: Jeryl Columbia, MD;  Location: [facility];  Service: Endoscopy;  Laterality: N/A;  . Joint replacement      both knees  . Trigger finger release  11/22/2011    Procedure: RELEASE TRIGGER FINGER/A-1 PULLEY;  Surgeon: Meredith Pel, MD;  Location: [facility];  Service: Orthopedics;  Laterality: Left;  Left trigger thumb release  . Eye surgery    . Cardiac catheterization  02/14/2012    no evidence of obstructive coronary disease ,low EDP with normal EF  . Doppler echocardiography  04/19/2012    EF 0000000; normal diastolic pressures, no regional wall motion motion abnormalities  . Nm myocar perf wall motion  01/26/2012    EF 79%,LV normal,ST SEGMENT CHANGE SUGGESTIVE OF ISCHEMIA.  Eusebio Me doppler  05/29/2012    RIGHT EXTREM. NORMAL VENOUS DUPLEX    FAMILY HISTORY: Family History  Problem Relation Age of Onset  .  Cancer Mother   . Cancer - Prostate Father     SOCIAL HISTORY: History   Social History  . Marital Status: Married    Spouse Name: james    Number of Children: 2  . Years of Education: college   Occupational History  . retired    Social History Main Topics  . Smoking status:  Never Smoker   . Smokeless tobacco: Never Used  . Alcohol Use: No  . Drug Use: No  . Sexual Activity: Yes   Other Topics Concern  . Not on file   Social History Narrative   Married, mother of 2, grandmother of 93. Is not begin routine exercise     PHYSICAL EXAM  Filed Vitals:   11/15/12 1519  BP: 118/63  Pulse: 52  Temp: 98.5 F (36.9 C)  TempSrc: Oral  Height: 5\' 5"  (1.651 m)  Weight: 275 lb (124.739 kg)   Body mass index is 45.76 kg/(m^2).  Physical Exam  General: Obese elderly African American lady in no distress, seated Head: head normocephalic and atraumatic. Orohparynx benign  Neck: supple with no carotid or supraclavicular bruits  Cardiovascular: regular rate and rhythm, no murmurs  Musculoskeletal: no deformity  Skin: no rash/petichiae  Vascular: Normal pulses all extremities  Neurologic Exam  Mental Status: Awake and fully alert. Oriented to place and time. Recent and remote memory intact. Attention span, concentration and fund of knowledge appropriate. Mood and affect appropriate.  Cranial Nerves:  Pupils equal, briskly reactive to light. Extraocular movements full without nystagmus. Visual fields full to confrontation. Hearing intact. Facial sensation intact. Face, tongue, palate moves normally and symmetrically.  Motor: Normal bulk and tone. Normal strength in all tested extremity muscles. Mild action tremor of the right hand an outstretched.  Sensory.: intact to touch  Coordination:  Finger-to-nose and heel-to-shin performed accurately bilaterally.  Gait and Station: not tested; in wheelchair. Reflexes: 1+ and symmetric.  DIAGNOSTIC DATA (LABS, IMAGING, TESTING) - I reviewed patient records, labs, notes, testing and imaging myself where available.  Lab Results  Component Value Date   WBC 8.3 07/23/2012   HGB 11.8* 07/23/2012   HCT 35.2* 07/23/2012   MCV 91.9 07/23/2012   PLT 242 07/23/2012      Component Value Date/Time   NA 138 07/23/2012 0928   K 3.7  07/23/2012 0928   CL 97 07/23/2012 0928   CO2 27 07/23/2012 0928   GLUCOSE 214* 07/23/2012 0928   BUN 10 07/23/2012 0928   CREATININE 1.06 07/23/2012 0928   CALCIUM 8.9 07/23/2012 0928   PROT 6.8 07/23/2012 0928   ALBUMIN 3.5 07/23/2012 0928   AST 14 07/23/2012 0928   ALT 6 07/23/2012 0928   ALKPHOS 83 07/23/2012 0928   BILITOT 0.5 07/23/2012 0928   GFRNONAA 51* 07/23/2012 0928   GFRAA 59* 07/23/2012 0928   Lab Results  Component Value Date   CHOL 125 04/20/2012   HDL 49 04/20/2012   LDLCALC 42 04/20/2012   TRIG 168* 04/20/2012   CHOLHDL 2.6 04/20/2012   Lab Results  Component Value Date   HGBA1C 8.2* 04/19/2012   No results found for this basename: VITAMINB12   No results found for this basename: TSH   MRI cervical spine without 08/21/12 Abnormal MRI scan of cervical spine showing prominent spondylitic changes mostly at C 5-6 and C4-5 but lateral disc osteophyte protrusions resulting in moderate foraminal narrowing on the right and mild canal stenosis but without definite compression.  MRI brain with and without 08/21/12 Mildly  abnormal MRI scan of the brain showing mild changes of chronic microvascular ischemia. Stable 1.1 cm benign right occipital bony cyst. Advanced changes of chronic paranasal sinusitis as compared with previous MRI scan from 02/16/2010.  ASSESSMENT AND PLAN 72 year old lady with new-onset refractory chronic daily headache for the last 4 weeks following dental extraction. The headache has mixed vascular and tension headache features. Prior history of left brain TIA in March 2014 with vascular risk factors of sleep apnea, diabetes, hypertension, hyperlipidemia, obesity.   PLAN:  Continue Zanaflex daily to help with muscle tension component of headaches. I have advised her to do daily mild neck exercises.  Take 2 Klonopin 0.5 mg tablets at night (instead of 1) for restless less. Return for followup in 3 months with Charlott Holler, NP  No orders of the defined types were placed in  this encounter.    Meds ordered this encounter  Medications  . clonazePAM (KLONOPIN) 0.5 MG tablet    Sig: Take 2 tablets (1 mg total) by mouth 2 (two) times daily. Take 2 tablets in every morning and takes 2 tablet every evening.    Dispense:  120 tablet    Refill:  5    Order Specific Question:  Supervising Provider    Answer:  Zada Finders Naydene Kamrowski, MSN, NP-C 11/15/2012, 4:29 PM [facility] [address], [place], [place] [address] [phone]

## 2012-12-18 ENCOUNTER — Other Ambulatory Visit: Payer: Medicare HMO

## 2012-12-21 ENCOUNTER — Other Ambulatory Visit: Payer: Medicare HMO

## 2013-02-08 ENCOUNTER — Encounter: Payer: Self-pay | Admitting: Cardiology

## 2013-02-08 ENCOUNTER — Ambulatory Visit (INDEPENDENT_AMBULATORY_CARE_PROVIDER_SITE_OTHER): Payer: Medicare HMO | Admitting: Cardiology

## 2013-02-08 VITALS — BP 160/70 | HR 54 | Ht 67.5 in | Wt 289.0 lb

## 2013-02-08 DIAGNOSIS — I493 Ventricular premature depolarization: Secondary | ICD-10-CM

## 2013-02-08 DIAGNOSIS — R9439 Abnormal result of other cardiovascular function study: Secondary | ICD-10-CM

## 2013-02-08 DIAGNOSIS — R931 Abnormal findings on diagnostic imaging of heart and coronary circulation: Secondary | ICD-10-CM

## 2013-02-08 DIAGNOSIS — I10 Essential (primary) hypertension: Secondary | ICD-10-CM

## 2013-02-08 DIAGNOSIS — R002 Palpitations: Secondary | ICD-10-CM

## 2013-02-08 DIAGNOSIS — I519 Heart disease, unspecified: Secondary | ICD-10-CM

## 2013-02-08 DIAGNOSIS — E785 Hyperlipidemia, unspecified: Secondary | ICD-10-CM

## 2013-02-08 DIAGNOSIS — I4949 Other premature depolarization: Secondary | ICD-10-CM

## 2013-02-08 NOTE — Patient Instructions (Signed)
Continue with current medication   Your physician wants you to follow-up in 6 month Dr Ellyn Hack.   You will receive a reminder letter in the mail two months in advance. If you don't receive a letter, please call our office to schedule the follow-up appointment.

## 2013-02-08 NOTE — Progress Notes (Signed)
Patient ID: Tara Jackson, female   DOB: 1940/10/02, 73 y.o.   MRN: [id number]  Clinic Note: HPI: Tara Jackson is a 73 y.o. female with a detailed PMH below who presents today for follow-up. I last saw her in July of 2014 that was the first evaluation after TIA she had back in February. I performed a cardiac catheterization in January of last year which he'll continue on the  Interval History: His impression doing relatively well over last 6 months, but has been under a significant amount of stress recently since her husband was just given some bad news about his prostate cancer with his recent Gleason score being 7. She seemed to be taking it worse but he is. She's been "cheating a bit "with her eating -- grazing more than actually eating full meals. Therefore his eating probably more overall. Her A1c has gone up.  A cardiac standpoint, she really is doing fairly well. Her palpitations seem to be relatively well-controlled, but definitely present. She was not aware that the propanolol was actually helping this. Her dyspnea is still present but no worse or better than it has been before. She otherwise denies any chest tightness pressure with rest or exertion. No resting dyspnea just simply exertional.  She is somewhat debilitated due to her arthritis pains.   No recurrent symptoms is not suggestive of TIA or amaurosis fugax. No syncope or near syncope. No real lightheadedness or dizziness, no weakness.  Mild edema but no PND or orthopnea. No melena, hematochezia hematuria.  Past Medical History  Diagnosis Date  . DVT (deep venous thrombosis)   . Asthma   . Hypothyroidism   . Anxiety   . Depression   . Sleep apnea     cpap disontinued; test done 07/14/2008 ordered per  [facility]rum  . Stroke     tia and stroke. Weakness Rt hand  . H/O hiatal hernia   . Arthritis   . Anemia   . Complication of anesthesia     hard to wake up after anesthesia  . PE (pulmonary thromboembolism)     history of  recurrent RLE DVT with PE - last PE ~>13 yrs; Maintained on Plavix  . Hyperlipidemia   . Diabetes mellitus type 2, insulin dependent   . Hypertension associated with diabetes   . Sleep apnea   . Glaucoma   . Cataract   . Diabetic neuropathy   . Palpitations     Cardionet monitor - revealed mostly normal sinus rhythm, sinus bradycardia with first-degree A-V block heart rates mostly in the 50s and 60s with some 70s. No arrhythmias, PVCs or PACs noted.  Marland Kitchen TIA (transient ischemic attack) March 2014  . Diastolic dysfunction, left ventricle     mild LVH, EF 55% by echo; no ischemia by stress 01/2010 echo [facility])    Prior Cardiac Evaluation and Past Surgical History: Past Surgical History  Procedure Laterality Date  . Cardiac catheterization  02/14/2012    no evidence of obstructive coronary disease ,low EDP with normal EF  . Doppler echocardiography  04/19/2012    EF 55-60%; normal diastolic pressures, no regional wall motion motion abnormalities  . Nm myocar perf wall motion  01/26/2012    EF 79%,LV normal,ST SEGMENT CHANGE SUGGESTIVE OF ISCHEMIA.  Felicity Coyer doppler  05/29/2012    RIGHT EXTREM. NORMAL VENOUS DUPLEX    Medications and Allergies Reviewed on Epic  History   Social History  . Marital Status: Married    Spouse Name:  james    Number of Children: 2  . Years of Education: college   Occupational History  . retired    Social History Main Topics  . Smoking status: Never Smoker   . Smokeless tobacco: Never Used  . Alcohol Use: No  . Drug Use: No  . Sexual Activity: Yes   Other Topics Concern  . Not on file   Social History Narrative   Married, mother of 2, grandmother of 5. Is not begin routine exercise    ROS: A comprehensive Review of Systems - Negative concerned about her husbands health. Very anxious and concerned.. She denies any residual weakness is from her TIA. She does have musculoskeletal pains mostly in her knees and hips. She has baseline constipation  that seems to be stable with laxatives.  PHYSICAL EXAM BP 160/70  Pulse 54  Ht 5' 7.5" (1.715 m)  Wt 289 lb (131.09 kg)  BMI 44.57 kg/m2 General appearance: alert, cooperative, appears stated age, no distress, morbidly obese and mostly truncal obesity. Pleasant mood and affect. Well groomed. Seems to be in better spirits than last visit. Neck: no carotid bruit and no JVD Lungs: clear to auscultation bilaterally, normal percussion bilaterally and nonlabored, good air movement. Heart: regular rate and rhythm, S1, S2 normal, no murmur, click, rub or gallop and normal apical impulse Abdomen: soft, non-tender; bowel sounds normal; no masses,  no organomegaly and truncal obesity noted. Extremities: extremities normal, atraumatic, no cyanosis or edema and she has bilateral medial and lateral malleolus swelling. Does not involve the rest of foot or ankle and does not involve the calves. Not consistent with edema. Pulses: 2+ and symmetric Neurologic: Grossly normal  NFA:[id number] today: Yes  Adult ECG Report  Rate: 54  Rhythm: sinus bradycardia; with first-degree AV block and occasional PVCs.  QRS Axis: -19  PR Interval: 236 ms  QRS Duration: 84 ms  QTc: 451 ms  Voltages: Normal  Conduction Disturbances: first-degree A-V block   Other Abnormalities: Nonspecific ST-T changes. One PVC   Narrative Interpretation: Sinus bradycardia with first-degree AV block and occasional PVCs. Otherwise normal ECG with nonspecific ST changes.  ASSESSMENT / PLAN: Stable cardiac standpoint.   HYPERTENSION Interestingly enough, her blood pressure is high today, whereas it had been low in the past. We had backed off on her Diovan which is now up to 160 mg. This is without the HCTZ which I would continue to hold so she is using when necessary Lasix. The propranolol is probably not doing much for her blood pressure. With her not having any significant diastolic heart place symptoms, my thought would be just  continue with her current medications and reassess between myself or PCP. I think the level of stress she is under with her husband's new diagnosis is very much affecting her blood pressure. As I mentioned last note I would tolerate some permissive hypertension this is probably about as high as I would tolerate.  Diastolic dysfunction, left ventricle She is taking low-dose Lasix for mild diuretic effect. I think she can probably be on a standing medication. She is not like to get dehydrated with this low dose.  PVC's (premature ventricular contractions) She says that these palpitations do not really bother her that much. She is on a beta blocker, but I really can't increase much with her first-degree AV block and sinus bradycardia. My inflation we leave this alone unless she has to arrhythmias.  Abnormal nuclear cardiac imaging test -- Intermediate Risk with inferior-inferolateral ischemia. False positive  Myoview by catheterization  HYPERLIPIDEMIA On stable dose of Crestor. Followed by PCP.   Meds ordered this encounter  Medications  . valsartan-hydrochlorothiazide (DIOVAN HCT) 80-12.5 MG per tablet    Sig: Take 1 tablet by mouth daily.    Dispense:  90 tablet    Refill:  3    Followup: 6 months  Rickita Forstner W. Herbie Baltimore, M.D., M.S. [facility] [address] [place], [place]  [address]  [phone] Pager # [phone]

## 2013-02-10 ENCOUNTER — Encounter: Payer: Self-pay | Admitting: Cardiology

## 2013-02-10 DIAGNOSIS — I493 Ventricular premature depolarization: Secondary | ICD-10-CM | POA: Insufficient documentation

## 2013-02-10 DIAGNOSIS — I519 Heart disease, unspecified: Secondary | ICD-10-CM | POA: Insufficient documentation

## 2013-02-10 NOTE — Assessment & Plan Note (Signed)
Interestingly enough, her blood pressure is high today, whereas it had been low in the past. We had backed off on her Diovan which is now up to 160 mg. This is without the HCTZ which I would continue to hold so she is using when necessary Lasix. The propranolol is probably not doing much for her blood pressure. With her not having any significant diastolic heart place symptoms, my thought would be just continue with her current medications and reassess between myself or PCP. I think the level of stress she is under with her husband's new diagnosis is very much affecting her blood pressure. As I mentioned last note I would tolerate some permissive hypertension this is probably about as high as I would tolerate.

## 2013-02-10 NOTE — Assessment & Plan Note (Signed)
False positive Myoview by catheterization

## 2013-02-10 NOTE — Assessment & Plan Note (Signed)
She is taking low-dose Lasix for mild diuretic effect. I think she can probably be on a standing medication. She is not like to get dehydrated with this low dose.

## 2013-02-10 NOTE — Assessment & Plan Note (Signed)
She says that these palpitations do not really bother her that much. She is on a beta blocker, but I really can't increase much with her first-degree AV block and sinus bradycardia. My inflation we leave this alone unless she has to arrhythmias.

## 2013-02-10 NOTE — Assessment & Plan Note (Signed)
On stable dose of Crestor. Followed by PCP.

## 2013-02-15 ENCOUNTER — Ambulatory Visit: Payer: Medicare HMO | Admitting: Nurse Practitioner

## 2013-03-15 ENCOUNTER — Other Ambulatory Visit: Payer: Self-pay

## 2013-03-15 DIAGNOSIS — Z1231 Encounter for screening mammogram for malignant neoplasm of breast: Secondary | ICD-10-CM

## 2013-03-29 ENCOUNTER — Ambulatory Visit: Payer: Medicare HMO

## 2013-04-19 DIAGNOSIS — I639 Cerebral infarction, unspecified: Secondary | ICD-10-CM

## 2013-04-19 HISTORY — DX: Cerebral infarction, unspecified: I63.9

## 2013-04-30 ENCOUNTER — Other Ambulatory Visit (HOSPITAL_COMMUNITY): Payer: Self-pay | Admitting: Internal Medicine

## 2013-04-30 ENCOUNTER — Ambulatory Visit (HOSPITAL_COMMUNITY)
Admission: RE | Admit: 2013-04-30 | Discharge: 2013-04-30 | Disposition: A | Discharge status: Home / Self Care | Payer: Medicare HMO | Source: Ambulatory Visit | Attending: Vascular Surgery | Admitting: Vascular Surgery

## 2013-04-30 DIAGNOSIS — M79609 Pain in unspecified limb: Secondary | ICD-10-CM

## 2013-04-30 DIAGNOSIS — Z86711 Personal history of pulmonary embolism: Secondary | ICD-10-CM

## 2013-04-30 NOTE — Progress Notes (Signed)
Preliminary results phoned to Dr. Keane Police office and given to [facility].

## 2013-05-11 ENCOUNTER — Encounter (HOSPITAL_COMMUNITY): Payer: Self-pay | Admitting: Emergency Medicine

## 2013-05-11 ENCOUNTER — Emergency Department (HOSPITAL_COMMUNITY)
Admission: EM | Admit: 2013-05-11 | Discharge: 2013-05-11 | Disposition: A | Discharge status: Home / Self Care | Payer: Medicare HMO | Attending: Emergency Medicine | Admitting: Emergency Medicine

## 2013-05-11 ENCOUNTER — Emergency Department (HOSPITAL_COMMUNITY): Payer: Medicare HMO

## 2013-05-11 DIAGNOSIS — E1169 Type 2 diabetes mellitus with other specified complication: Secondary | ICD-10-CM | POA: Insufficient documentation

## 2013-05-11 DIAGNOSIS — F411 Generalized anxiety disorder: Secondary | ICD-10-CM | POA: Insufficient documentation

## 2013-05-11 DIAGNOSIS — Z7902 Long term (current) use of antithrombotics/antiplatelets: Secondary | ICD-10-CM | POA: Insufficient documentation

## 2013-05-11 DIAGNOSIS — R609 Edema, unspecified: Secondary | ICD-10-CM | POA: Insufficient documentation

## 2013-05-11 DIAGNOSIS — E1142 Type 2 diabetes mellitus with diabetic polyneuropathy: Secondary | ICD-10-CM | POA: Insufficient documentation

## 2013-05-11 DIAGNOSIS — E663 Overweight: Secondary | ICD-10-CM | POA: Insufficient documentation

## 2013-05-11 DIAGNOSIS — Z794 Long term (current) use of insulin: Secondary | ICD-10-CM | POA: Insufficient documentation

## 2013-05-11 DIAGNOSIS — I10 Essential (primary) hypertension: Secondary | ICD-10-CM | POA: Insufficient documentation

## 2013-05-11 DIAGNOSIS — Z86711 Personal history of pulmonary embolism: Secondary | ICD-10-CM | POA: Insufficient documentation

## 2013-05-11 DIAGNOSIS — E1149 Type 2 diabetes mellitus with other diabetic neurological complication: Secondary | ICD-10-CM | POA: Insufficient documentation

## 2013-05-11 DIAGNOSIS — R350 Frequency of micturition: Secondary | ICD-10-CM | POA: Insufficient documentation

## 2013-05-11 DIAGNOSIS — E785 Hyperlipidemia, unspecified: Secondary | ICD-10-CM | POA: Insufficient documentation

## 2013-05-11 DIAGNOSIS — R3915 Urgency of urination: Secondary | ICD-10-CM | POA: Insufficient documentation

## 2013-05-11 DIAGNOSIS — Z8673 Personal history of transient ischemic attack (TIA), and cerebral infarction without residual deficits: Secondary | ICD-10-CM | POA: Insufficient documentation

## 2013-05-11 DIAGNOSIS — Z86718 Personal history of other venous thrombosis and embolism: Secondary | ICD-10-CM | POA: Insufficient documentation

## 2013-05-11 DIAGNOSIS — M129 Arthropathy, unspecified: Secondary | ICD-10-CM | POA: Insufficient documentation

## 2013-05-11 DIAGNOSIS — E039 Hypothyroidism, unspecified: Secondary | ICD-10-CM | POA: Insufficient documentation

## 2013-05-11 DIAGNOSIS — Z9889 Other specified postprocedural states: Secondary | ICD-10-CM | POA: Insufficient documentation

## 2013-05-11 DIAGNOSIS — Z862 Personal history of diseases of the blood and blood-forming organs and certain disorders involving the immune mechanism: Secondary | ICD-10-CM | POA: Insufficient documentation

## 2013-05-11 DIAGNOSIS — J45909 Unspecified asthma, uncomplicated: Secondary | ICD-10-CM | POA: Insufficient documentation

## 2013-05-11 DIAGNOSIS — M549 Dorsalgia, unspecified: Secondary | ICD-10-CM | POA: Insufficient documentation

## 2013-05-11 DIAGNOSIS — F329 Major depressive disorder, single episode, unspecified: Secondary | ICD-10-CM | POA: Insufficient documentation

## 2013-05-11 DIAGNOSIS — Z79899 Other long term (current) drug therapy: Secondary | ICD-10-CM | POA: Insufficient documentation

## 2013-05-11 DIAGNOSIS — Z8719 Personal history of other diseases of the digestive system: Secondary | ICD-10-CM | POA: Insufficient documentation

## 2013-05-11 DIAGNOSIS — F3289 Other specified depressive episodes: Secondary | ICD-10-CM | POA: Insufficient documentation

## 2013-05-11 LAB — URINALYSIS, ROUTINE W REFLEX MICROSCOPIC
Bilirubin Urine: NEGATIVE
Glucose, UA: NEGATIVE mg/dL
Hgb urine dipstick: NEGATIVE
Ketones, ur: NEGATIVE mg/dL
Leukocytes, UA: NEGATIVE
Nitrite: NEGATIVE
Protein, ur: NEGATIVE mg/dL
Specific Gravity, Urine: 1.026 (ref 1.005–1.030)
Urobilinogen, UA: 0.2 mg/dL (ref 0.0–1.0)
pH: 5.5 (ref 5.0–8.0)

## 2013-05-11 LAB — CBC
HCT: 37.2 % (ref 36.0–46.0)
Hemoglobin: 12.2 g/dL (ref 12.0–15.0)
MCH: 31.2 pg (ref 26.0–34.0)
MCHC: 32.8 g/dL (ref 30.0–36.0)
MCV: 95.1 fL (ref 78.0–100.0)
Platelets: 265 10*3/uL (ref 150–400)
RBC: 3.91 MIL/uL (ref 3.87–5.11)
RDW: 12.7 % (ref 11.5–15.5)
WBC: 8.4 10*3/uL (ref 4.0–10.5)

## 2013-05-11 LAB — BASIC METABOLIC PANEL
BUN: 12 mg/dL (ref 6–23)
CO2: 28 mEq/L (ref 19–32)
Calcium: 8.8 mg/dL (ref 8.4–10.5)
Chloride: 101 mEq/L (ref 96–112)
Creatinine, Ser: 0.9 mg/dL (ref 0.50–1.10)
GFR calc Af Amer: 71 mL/min — ABNORMAL LOW (ref >90)
GFR calc non Af Amer: 61 mL/min — ABNORMAL LOW (ref >90)
Glucose, Bld: 200 mg/dL — ABNORMAL HIGH (ref 70–99)
Potassium: 3.8 mEq/L (ref 3.7–5.3)
Sodium: 141 mEq/L (ref 137–147)

## 2013-05-11 LAB — I-STAT TROPONIN, ED: Troponin i, poc: 0.01 ng/mL (ref 0.00–0.08)

## 2013-05-11 LAB — CBG MONITORING, ED: Glucose-Capillary: 156 mg/dL — ABNORMAL HIGH (ref 70–99)

## 2013-05-11 LAB — D-DIMER, QUANTITATIVE: D-Dimer, Quant: 0.4 ug/mL-FEU (ref 0.00–0.48)

## 2013-05-11 MED ORDER — HYDROCODONE-ACETAMINOPHEN 5-325 MG PO TABS
1.0000 | ORAL_TABLET | Freq: Once | ORAL | Status: AC
  Administered 2013-05-11: 1 via ORAL
  Filled 2013-05-11: qty 1

## 2013-05-11 MED ORDER — HYDROCODONE-ACETAMINOPHEN 5-325 MG PO TABS: 1.0000 | ORAL_TABLET | Freq: Four times a day (QID) | ORAL | Status: DC | PRN

## 2013-05-11 NOTE — ED Provider Notes (Signed)
CSN: [id number]     Arrival date & time 05/11/13  1241 History   First MD Initiated Contact with Patient 05/11/13 1310     Chief Complaint  Patient presents with  . Back Pain     (Consider location/radiation/quality/duration/timing/severity/associated sxs/prior Treatment) HPI  This is a 73 year old female with a history of DVT, anxiety, PE, hypertension, diabetes, hyperlipidemia who presents with right-sided back pain. Patient reports onset of symptoms one week ago. She reports pain and swelling over the right back. Current pain is 4/10.  Nothing makes it better or worse. She denies any worsening pain with breathing.  She denies any shortness of breath or chest pain. She denies any injury. She denies any dysuria but does endorse increased urination. Patient is concerned that this is a pulmonary embolism. She states that she's had similar symptoms in the past when she's had a pulmonary embolism. She is maintained on Plavix.  She had negative Doppler studies last week.  Patient denies any weakness, numbness, or tingling of the bilateral lower extremities. She denies any urinary retention. She denies any fevers.  Past Medical History  Diagnosis Date  . DVT (deep venous thrombosis)   . Asthma   . Hypothyroidism   . Anxiety   . Depression   . Sleep apnea     cpap disontinued; test done 07/14/2008 ordered per  [facility]  . Stroke     tia and stroke. Weakness Rt hand  . H/O hiatal hernia   . Arthritis   . Anemia   . Complication of anesthesia     hard to wake up after anesthesia  . 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 Hyperlipidemia   . Diabetes mellitus type 2, insulin dependent   . Hypertension associated with diabetes   . Sleep apnea   . Glaucoma   . Cataract   . Diabetic neuropathy   . Palpitations     Cardionet monitor - revealed mostly normal sinus rhythm, sinus bradycardia with first-degree A-V block  heart rates mostly in the 50s and 60s with some 70s. No arrhythmias, PVCs or PACs noted.  Marland Kitchen TIA (transient ischemic attack) March 2014  . Diastolic dysfunction, left ventricle     mild LVH, EF 55% by echo; no ischemia by stress 01/2010 echo [facility])   Past Surgical History  Procedure Laterality Date  . Abdominal hysterectomy    . Cholecystectomy    . Back surgery      steroid inj  . Tonsillectomy    . Appendectomy    . Esophagogastroduodenoscopy  08/09/2011    Procedure: ESOPHAGOGASTRODUODENOSCOPY (EGD);  Surgeon: Jeryl Columbia, MD;  Location: [facility];  Service: Endoscopy;  Laterality: N/A;  . Hot hemostasis  08/09/2011    Procedure: HOT HEMOSTASIS (ARGON PLASMA COAGULATION/BICAP);  Surgeon: Jeryl Columbia, MD;  Location: [facility];  Service: Endoscopy;  Laterality: N/A;  . Joint replacement      both knees  . Trigger finger release  11/22/2011    Procedure: RELEASE TRIGGER FINGER/A-1 PULLEY;  Surgeon: Meredith Pel, MD;  Location: [facility];  Service: Orthopedics;  Laterality: Left;  Left trigger thumb release  . Eye surgery    . Cardiac catheterization  02/14/2012    no evidence of obstructive coronary disease ,low EDP with normal EF  . Doppler echocardiography  04/19/2012    EF 0000000; normal diastolic pressures, no regional wall motion  motion abnormalities  . Nm myocar perf wall motion  01/26/2012    EF 79%,LV normal,ST SEGMENT CHANGE SUGGESTIVE OF ISCHEMIA.  Eusebio Me doppler  05/29/2012    RIGHT EXTREM. NORMAL VENOUS DUPLEX   Family History  Problem Relation Age of Onset  . Cancer Mother   . Cancer - Prostate Father    History  Substance Use Topics  . Smoking status: Never Smoker   . Smokeless tobacco: Never Used  . Alcohol Use: No   OB History   Grav Para Term Preterm Abortions TAB SAB Ect Mult Living                 Review of Systems  Constitutional: Negative for fever.  Respiratory: Negative for cough, chest tightness and shortness of breath.   Cardiovascular:  Negative for chest pain and leg swelling.  Gastrointestinal: Negative for nausea, vomiting and abdominal pain.  Genitourinary: Positive for urgency and frequency. Negative for dysuria.  Musculoskeletal: Positive for back pain.  Skin: Negative for wound.  Neurological: Negative for headaches.  Psychiatric/Behavioral: Negative for confusion.  All other systems reviewed and are negative.      Allergies  Iodine; Metoclopramide hcl; Sulfa antibiotics; Iohexol; Lactose intolerance (gi); and Lansoprazole  Home Medications   Current Outpatient Rx  Name  Route  Sig  Dispense  Refill  . albuterol (PROVENTIL HFA;VENTOLIN HFA) 108 (90 BASE) MCG/ACT inhaler   Inhalation   Inhale 2 puffs into the lungs every 6 (six) hours as needed for wheezing.         . busPIRone (BUSPAR) 7.5 MG tablet   Oral   Take 7.5 mg by mouth daily.          . Cholecalciferol (VITAMIN D-3 PO)   Oral   Take 1 tablet by mouth daily.          . clonazePAM (KLONOPIN) 0.5 MG tablet   Oral   Take 0.5-1 mg by mouth 2 (two) times daily. 1 mg (2 tablets) in the morning and 0.5 mg (1 tablet) at night         . clopidogrel (PLAVIX) 75 MG tablet   Oral   Take 1 tablet (75 mg total) by mouth daily.   90 tablet   3     Name Brand Necessary.   . dicyclomine (BENTYL) 10 MG capsule   Oral   Take 10 mg by mouth 3 (three) times daily as needed for spasms.          . Dicyclomine HCl (BENTYL PO)   Oral   Take 1 tablet by mouth daily.         Marland Kitchen esomeprazole (NEXIUM) 40 MG capsule   Oral   Take 40 mg by mouth daily before breakfast.         . glipiZIDE (GLUCOTROL) 10 MG tablet   Oral   Take 20 mg by mouth 2 (two) times daily before a meal.          . HYDROCHLOROTHIAZIDE PO   Oral   Take 1 tablet by mouth daily as needed (for swelling).         . hyoscyamine (LEVBID) 0.375 MG 12 hr tablet   Oral   Take 0.375 mg by mouth daily.         Marland Kitchen ibuprofen (ADVIL,MOTRIN) 200 MG tablet   Oral   Take  200 mg by mouth every 6 (six) hours as needed for mild pain.         Marland Kitchen  insulin NPH (HUMULIN N,NOVOLIN N) 100 UNIT/ML injection   Subcutaneous   Inject 10-20 Units into the skin 2 (two) times daily before a meal. Inject 20 units in the morning and 10 units at night.         . insulin regular (NOVOLIN R,HUMULIN R) 100 units/mL injection   Subcutaneous   Inject 20-30 Units into the skin 3 (three) times daily before meals. Inject 30 units at breakfast, 20 units at lunchtime, and 20 units at dinner.         . levothyroxine (SYNTHROID, LEVOTHROID) 100 MCG tablet   Oral   Take 100 mcg by mouth daily.         Marland Kitchen lidocaine (LIDODERM) 5 %   Transdermal   Place 3 patches onto the skin daily. Remove & Discard patch within 12 hours or as directed by MD         . meclizine (ANTIVERT) 12.5 MG tablet   Oral   Take 12.5 mg by mouth 3 (three) times daily as needed for dizziness.          . nortriptyline (PAMELOR) 50 MG capsule   Oral   Take 50 mg by mouth at bedtime.         . polyethylene glycol (MIRALAX / GLYCOLAX) packet   Oral   Take 17 g by mouth daily as needed for mild constipation.         . potassium chloride SA (K-DUR,KLOR-CON) 20 MEQ tablet   Oral   Take 20 mEq by mouth daily.          . Probiotic Product (PROBIOTIC PO)   Oral   Take 1 capsule by mouth daily.          . rosuvastatin (CRESTOR) 20 MG tablet   Oral   Take 20 mg by mouth daily.         . Sennosides (EX-LAX PO)   Oral   Take 1 tablet by mouth daily.          . sucralfate (CARAFATE) 1 G tablet   Oral   Take 1 g by mouth daily.          . TERCONAZOLE VA   Vaginal   Place 1 application vaginally daily as needed (for yeast infections).          . traMADol-acetaminophen (ULTRACET) 37.5-325 MG per tablet   Oral   Take 1-2 tablets by mouth every 6 (six) hours as needed for moderate pain. Take 1 to 2  Tablets every 6 to 8 hrs as needed for pain         . Valsartan (DIOVAN PO)    Oral   Take 1 tablet by mouth daily.         Marland Kitchen HYDROcodone-acetaminophen (NORCO/VICODIN) 5-325 MG per tablet   Oral   Take 1 tablet by mouth every 6 (six) hours as needed.   10 tablet   0    BP 141/43  Pulse 53  Temp(Src) 98.1 F (36.7 C) (Oral)  Resp 18  Ht 5\' 7"  (1.702 m)  Wt 285 lb (129.275 kg)  BMI 44.63 kg/m2  SpO2 99% Physical Exam  Nursing note and vitals reviewed. Constitutional: She is oriented to person, place, and time. She appears well-developed and well-nourished.  Overweight  HENT:  Head: Normocephalic and atraumatic.  Neck: No JVD present.  Cardiovascular: Normal rate, regular rhythm and normal heart sounds.   No murmur heard. Pulmonary/Chest: Effort normal and breath sounds normal. No  respiratory distress. She has no wheezes.  Abdominal: Soft. There is no tenderness.  Musculoskeletal:  Tenderness to palpation of the right flank and CVA 1+ lower extremity edema No midline thoracic or lumbar tenderness  Neurological: She is alert and oriented to person, place, and time.  Skin: Skin is warm and dry.  Psychiatric: She has a normal mood and affect.    ED Course  Procedures (including critical care time) Labs Review Labs Reviewed  BASIC METABOLIC PANEL - Abnormal; Notable for the following:    Glucose, Bld 200 (*)    GFR calc non Af Amer 61 (*)    GFR calc Af Amer 71 (*)    All other components within normal limits  URINALYSIS, ROUTINE W REFLEX MICROSCOPIC - Abnormal; Notable for the following:    Color, Urine AMBER (*)    APPearance CLOUDY (*)    All other components within normal limits  CBG MONITORING, ED - Abnormal; Notable for the following:    Glucose-Capillary 156 (*)    All other components within normal limits  CBC  D-DIMER, QUANTITATIVE  I-STAT TROPOININ, ED   Imaging Review Dg Chest 2 View (if Patient Has Fever And/or Copd)  05/11/2013   CLINICAL DATA:  Chest pain.  EXAM: CHEST  2 VIEW  COMPARISON:  05/30/2012.  FINDINGS: The heart is  borderline enlarged but stable. The mediastinal and hilar contours are normal. The lungs are clear. No pleural effusion. The bony thorax is intact.  IMPRESSION: No acute cardiopulmonary findings.   Electronically Signed   By: Kalman Jewels M.D.   On: 05/11/2013 15:02     EKG Interpretation   Date/Time:  Saturday May 11 2013 13:25:54 EDT Ventricular Rate:  57 PR Interval:  232 QRS Duration: 92 QT Interval:  464 QTC Calculation: 452 R Axis:   2 Text Interpretation:  Sinus rhythm Prolonged PR interval Low voltage,  precordial leads Confirmed by Gurinder Toral  MD, Kross Swallows ([id number]) on 05/11/2013  1:33:49 PM      MDM   Final diagnoses:  Back pain    Patient presents with right sided back pain. She feels this is consistent with prior presentations for PE. Vital signs are within normal limits. No evidence of tachycardia. Patient has tenderness to palpation over the right flank. This is more consistent with musculoskeletal symptoms persist kidney disease. We'll send screening d-dimer given recent negative bilateral lower extremity ultrasound. D-dimer is negative and have low suspicion at this time for PE. Discussed this with the patient. Lab work and chest x-ray were also obtained. Lab work is at baseline and there is no evidence of urinary tract infection. Patient has no evidence of midline tenderness with pain mostly over the right flank. Given tenderness to palpation, will treat with pain medications and have patient follow closely with primary physician. No evidence of retroperitoneal hematoma or ecchymosis.  After history, exam, and medical workup I feel the patient has been appropriately medically screened and is safe for discharge home. Pertinent diagnoses were discussed with the patient. Patient was given return precautions.     Merryl Hacker, MD 05/11/13 [id number]

## 2013-05-11 NOTE — Discharge Instructions (Signed)
Back Pain, Adult Low back pain is very common. About 1 in 5 people have back pain.The cause of low back pain is rarely dangerous. The pain often gets better over time.About half of people with a sudden onset of back pain feel better in just 2 weeks. About 8 in 10 people feel better by 6 weeks.  CAUSES Some common causes of back pain include:  Strain of the muscles or ligaments supporting the spine.  Wear and tear (degeneration) of the spinal discs.  Arthritis.  Direct injury to the back. DIAGNOSIS Most of the time, the direct cause of low back pain is not known.However, back pain can be treated effectively even when the exact cause of the pain is unknown.Answering your caregiver's questions about your overall health and symptoms is one of the most accurate ways to make sure the cause of your pain is not dangerous. If your caregiver needs more information, he or she may order lab work or imaging tests (X-rays or MRIs).However, even if imaging tests show changes in your back, this usually does not require surgery. HOME CARE INSTRUCTIONS For many people, back pain returns.Since low back pain is rarely dangerous, it is often a condition that people can learn to manageon their own.   Remain active. It is stressful on the back to sit or stand in one place. Do not sit, drive, or stand in one place for more than 30 minutes at a time. Take short walks on level surfaces as soon as pain allows.Try to increase the length of time you walk each day.  Do not stay in bed.Resting more than 1 or 2 days can delay your recovery.  Do not avoid exercise or work.Your body is made to move.It is not dangerous to be active, even though your back may hurt.Your back will likely heal faster if you return to being active before your pain is gone.  Pay attention to your body when you bend and lift. Many people have less discomfortwhen lifting if they bend their knees, keep the load close to their bodies,and  avoid twisting. Often, the most comfortable positions are those that put less stress on your recovering back.  Find a comfortable position to sleep. Use a firm mattress and lie on your side with your knees slightly bent. If you lie on your back, put a pillow under your knees.  Only take over-the-counter or prescription medicines as directed by your caregiver. Over-the-counter medicines to reduce pain and inflammation are often the most helpful.Your caregiver may prescribe muscle relaxant drugs.These medicines help dull your pain so you can more quickly return to your normal activities and healthy exercise.  Put ice on the injured area.  Put ice in a plastic bag.  Place a towel between your skin and the bag.  Leave the ice on for 15-20 minutes, 03-04 times a day for the first 2 to 3 days. After that, ice and heat may be alternated to reduce pain and spasms.  Ask your caregiver about trying back exercises and gentle massage. This may be of some benefit.  Avoid feeling anxious or stressed.Stress increases muscle tension and can worsen back pain.It is important to recognize when you are anxious or stressed and learn ways to manage it.Exercise is a great option. SEEK MEDICAL CARE IF:  You have pain that is not relieved with rest or medicine.  You have pain that does not improve in 1 week.  You have new symptoms.  You are generally not feeling well. SEEK   IMMEDIATE MEDICAL CARE IF:   You have pain that radiates from your back into your legs.  You develop new bowel or bladder control problems.  You have unusual weakness or numbness in your arms or legs.  You develop nausea or vomiting.  You develop abdominal pain.  You feel faint. Document Released: 01/24/2005 Document Revised: 07/26/2011 Document Reviewed: 06/14/2010 [employer] Patient Information 2014 [employer].  

## 2013-05-11 NOTE — ED Notes (Signed)
Pt c/o mid back pain x 1 week. Pt reports that she had a doppler done and results were negative. Pt reports history of multiple falls, but has not fallen recently. Pt reports increase in urination but no odor. Pt denies heavy lifting or strenuous activity. Pt is concerned for PE, pt has had 3 in the past with similar symptoms.

## 2013-05-11 NOTE — ED Notes (Signed)
Pt returned from xray

## 2013-05-28 ENCOUNTER — Telehealth: Payer: Self-pay | Admitting: *Deleted

## 2013-05-28 NOTE — Telephone Encounter (Signed)
Ok to do so

## 2013-05-28 NOTE — Telephone Encounter (Signed)
Pt calling stating that she is having an epidural at pain management on 06/07/13 and needed to come off of Plavix before the procedure. Pt wanted to know if this was ok. Jeani Hawking, NP is out of the office, sending to the pt's doctor, Dr. Leonie Man. Please advise

## 2013-05-29 NOTE — Telephone Encounter (Signed)
Called pt to inform her per Dr. Leonie Man that the pt could stop taking Plavix before the epidural procedure pt is having. I advised the pt the if she has any other problems, questions or concerns to call the office. Pt verbalized understanding.

## 2013-08-05 ENCOUNTER — Emergency Department (HOSPITAL_COMMUNITY)
Admission: EM | Admit: 2013-08-05 | Discharge: 2013-08-05 | Disposition: A | Discharge status: Home / Self Care | Payer: Medicare HMO | Attending: Emergency Medicine | Admitting: Emergency Medicine

## 2013-08-05 ENCOUNTER — Encounter (HOSPITAL_COMMUNITY): Payer: Self-pay | Admitting: Emergency Medicine

## 2013-08-05 ENCOUNTER — Emergency Department (HOSPITAL_COMMUNITY): Payer: Medicare HMO

## 2013-08-05 DIAGNOSIS — R1084 Generalized abdominal pain: Secondary | ICD-10-CM | POA: Insufficient documentation

## 2013-08-05 DIAGNOSIS — J45909 Unspecified asthma, uncomplicated: Secondary | ICD-10-CM | POA: Insufficient documentation

## 2013-08-05 DIAGNOSIS — E785 Hyperlipidemia, unspecified: Secondary | ICD-10-CM | POA: Insufficient documentation

## 2013-08-05 DIAGNOSIS — Z8673 Personal history of transient ischemic attack (TIA), and cerebral infarction without residual deficits: Secondary | ICD-10-CM | POA: Insufficient documentation

## 2013-08-05 DIAGNOSIS — G4730 Sleep apnea, unspecified: Secondary | ICD-10-CM | POA: Insufficient documentation

## 2013-08-05 DIAGNOSIS — Z8719 Personal history of other diseases of the digestive system: Secondary | ICD-10-CM | POA: Insufficient documentation

## 2013-08-05 DIAGNOSIS — Z79899 Other long term (current) drug therapy: Secondary | ICD-10-CM | POA: Insufficient documentation

## 2013-08-05 DIAGNOSIS — Z86718 Personal history of other venous thrombosis and embolism: Secondary | ICD-10-CM | POA: Insufficient documentation

## 2013-08-05 DIAGNOSIS — E039 Hypothyroidism, unspecified: Secondary | ICD-10-CM | POA: Insufficient documentation

## 2013-08-05 DIAGNOSIS — E1142 Type 2 diabetes mellitus with diabetic polyneuropathy: Secondary | ICD-10-CM | POA: Insufficient documentation

## 2013-08-05 DIAGNOSIS — E1149 Type 2 diabetes mellitus with other diabetic neurological complication: Secondary | ICD-10-CM | POA: Insufficient documentation

## 2013-08-05 DIAGNOSIS — F3289 Other specified depressive episodes: Secondary | ICD-10-CM | POA: Insufficient documentation

## 2013-08-05 DIAGNOSIS — K5900 Constipation, unspecified: Secondary | ICD-10-CM | POA: Insufficient documentation

## 2013-08-05 DIAGNOSIS — Z86711 Personal history of pulmonary embolism: Secondary | ICD-10-CM | POA: Insufficient documentation

## 2013-08-05 DIAGNOSIS — M129 Arthropathy, unspecified: Secondary | ICD-10-CM | POA: Insufficient documentation

## 2013-08-05 DIAGNOSIS — E1169 Type 2 diabetes mellitus with other specified complication: Secondary | ICD-10-CM | POA: Insufficient documentation

## 2013-08-05 DIAGNOSIS — F329 Major depressive disorder, single episode, unspecified: Secondary | ICD-10-CM | POA: Insufficient documentation

## 2013-08-05 DIAGNOSIS — I519 Heart disease, unspecified: Secondary | ICD-10-CM | POA: Insufficient documentation

## 2013-08-05 DIAGNOSIS — Z9889 Other specified postprocedural states: Secondary | ICD-10-CM | POA: Insufficient documentation

## 2013-08-05 DIAGNOSIS — Z862 Personal history of diseases of the blood and blood-forming organs and certain disorders involving the immune mechanism: Secondary | ICD-10-CM | POA: Insufficient documentation

## 2013-08-05 DIAGNOSIS — Z794 Long term (current) use of insulin: Secondary | ICD-10-CM | POA: Insufficient documentation

## 2013-08-05 DIAGNOSIS — F411 Generalized anxiety disorder: Secondary | ICD-10-CM | POA: Insufficient documentation

## 2013-08-05 LAB — CBC WITH DIFFERENTIAL/PLATELET
Basophils Absolute: 0 10*3/uL (ref 0.0–0.1)
Basophils Relative: 1 % (ref 0–1)
Eosinophils Absolute: 0.2 10*3/uL (ref 0.0–0.7)
Eosinophils Relative: 3 % (ref 0–5)
HCT: 33.2 % — ABNORMAL LOW (ref 36.0–46.0)
Hemoglobin: 10.4 g/dL — ABNORMAL LOW (ref 12.0–15.0)
Lymphocytes Relative: 33 % (ref 12–46)
Lymphs Abs: 2.1 10*3/uL (ref 0.7–4.0)
MCH: 30.1 pg (ref 26.0–34.0)
MCHC: 31.3 g/dL (ref 30.0–36.0)
MCV: 96.2 fL (ref 78.0–100.0)
Monocytes Absolute: 0.4 10*3/uL (ref 0.1–1.0)
Monocytes Relative: 7 % (ref 3–12)
Neutro Abs: 3.5 10*3/uL (ref 1.7–7.7)
Neutrophils Relative %: 56 % (ref 43–77)
Platelets: 212 10*3/uL (ref 150–400)
RBC: 3.45 MIL/uL — ABNORMAL LOW (ref 3.87–5.11)
RDW: 13.3 % (ref 11.5–15.5)
WBC: 6.2 10*3/uL (ref 4.0–10.5)

## 2013-08-05 LAB — URINALYSIS, ROUTINE W REFLEX MICROSCOPIC
Bilirubin Urine: NEGATIVE
Glucose, UA: NEGATIVE mg/dL
Hgb urine dipstick: NEGATIVE
Ketones, ur: NEGATIVE mg/dL
Leukocytes, UA: NEGATIVE
Nitrite: NEGATIVE
Protein, ur: NEGATIVE mg/dL
Specific Gravity, Urine: 1.022 (ref 1.005–1.030)
Urobilinogen, UA: 1 mg/dL (ref 0.0–1.0)
pH: 5 (ref 5.0–8.0)

## 2013-08-05 LAB — COMPREHENSIVE METABOLIC PANEL
ALT: 6 U/L (ref 0–35)
AST: 12 U/L (ref 0–37)
Albumin: 3 g/dL — ABNORMAL LOW (ref 3.5–5.2)
Alkaline Phosphatase: 67 U/L (ref 39–117)
BUN: 14 mg/dL (ref 6–23)
CO2: 27 mEq/L (ref 19–32)
Calcium: 8.3 mg/dL — ABNORMAL LOW (ref 8.4–10.5)
Chloride: 100 mEq/L (ref 96–112)
Creatinine, Ser: 1.04 mg/dL (ref 0.50–1.10)
GFR calc Af Amer: 60 mL/min — ABNORMAL LOW (ref >90)
GFR calc non Af Amer: 52 mL/min — ABNORMAL LOW (ref >90)
Glucose, Bld: 162 mg/dL — ABNORMAL HIGH (ref 70–99)
Potassium: 4 mEq/L (ref 3.7–5.3)
Sodium: 140 mEq/L (ref 137–147)
Total Bilirubin: 0.4 mg/dL (ref 0.3–1.2)
Total Protein: 6.7 g/dL (ref 6.0–8.3)

## 2013-08-05 LAB — CBG MONITORING, ED: Glucose-Capillary: 164 mg/dL — ABNORMAL HIGH (ref 70–99)

## 2013-08-05 LAB — POC OCCULT BLOOD, ED: Fecal Occult Bld: NEGATIVE

## 2013-08-05 LAB — LIPASE, BLOOD: Lipase: 19 U/L (ref 11–59)

## 2013-08-05 MED ORDER — MAGNESIUM CITRATE PO SOLN: 1.0000 | Freq: Once | ORAL | Status: DC

## 2013-08-05 MED ORDER — HYDROCODONE-ACETAMINOPHEN 5-325 MG PO TABS: 2.0000 | ORAL_TABLET | ORAL | Status: DC | PRN

## 2013-08-05 MED ORDER — HYDROMORPHONE HCL PF 1 MG/ML IJ SOLN: 0.5000 mg | INTRAMUSCULAR | Status: DC

## 2013-08-05 MED ORDER — BISACODYL 10 MG RE SUPP
10.0000 mg | Freq: Once | RECTAL | Status: AC
  Administered 2013-08-05: 10 mg via RECTAL
  Filled 2013-08-05: qty 1

## 2013-08-05 MED ORDER — ONDANSETRON 4 MG PO TBDP
8.0000 mg | ORAL_TABLET | Freq: Once | ORAL | Status: AC
  Administered 2013-08-05: 8 mg via ORAL
  Filled 2013-08-05: qty 2

## 2013-08-05 MED ORDER — LACTULOSE 10 GM/15ML PO SOLN
20.0000 g | Freq: Once | ORAL | Status: DC
  Filled 2013-08-05: qty 30

## 2013-08-05 MED ORDER — ONDANSETRON HCL 4 MG PO TABS: 4.0000 mg | ORAL_TABLET | Freq: Three times a day (TID) | ORAL | Status: DC | PRN

## 2013-08-05 MED ORDER — ONDANSETRON HCL 4 MG/2ML IJ SOLN
4.0000 mg | Freq: Once | INTRAMUSCULAR | Status: AC
  Administered 2013-08-05: 4 mg via INTRAVENOUS
  Filled 2013-08-05: qty 2

## 2013-08-05 MED ORDER — HYDROMORPHONE HCL PF 1 MG/ML IJ SOLN: 1.0000 mg | INTRAMUSCULAR | Status: DC

## 2013-08-05 MED ORDER — HYDROMORPHONE HCL PF 1 MG/ML IJ SOLN
0.5000 mg | INTRAMUSCULAR | Status: AC
  Administered 2013-08-05: 0.5 mg via INTRAVENOUS
  Filled 2013-08-05: qty 1

## 2013-08-05 MED ORDER — HYDROCODONE-ACETAMINOPHEN 5-325 MG PO TABS
1.0000 | ORAL_TABLET | Freq: Once | ORAL | Status: AC
  Administered 2013-08-05: 1 via ORAL
  Filled 2013-08-05: qty 1

## 2013-08-05 NOTE — ED Notes (Signed)
Pt states she feels has been feeling nauseated since Thursday.  Pt states she has lower abdominal pain with tenderness to the touch.  Pt states she feels constipated with small amounts of diarrhea.  Pt states she had one instance of bright red blood in her diarrhea last night.

## 2013-08-05 NOTE — Discharge Instructions (Signed)
Read the information below.  Use the prescribed medication as directed.  Please discuss all new medications with your pharmacist.  Do not take additional tylenol while taking the prescribed pain medication to avoid overdose.  You may return to the Emergency Department at any time for worsening condition or any new symptoms that concern you.  If you develop high fevers, worsening abdominal pain, uncontrolled vomiting, or are unable to tolerate fluids by mouth, return to the ER for a recheck.     Abdominal Pain Many things can cause abdominal pain. Usually, abdominal pain is not caused by a disease and will improve without treatment. It can often be observed and treated at home. Your health care provider will do a physical exam and possibly order blood tests and X-rays to help determine the seriousness of your pain. However, in many cases, more time must pass before a clear cause of the pain can be found. Before that point, your health care provider may not know if you need more testing or further treatment. HOME CARE INSTRUCTIONS  Monitor your abdominal pain for any changes. The following actions may help to alleviate any discomfort you are experiencing:  Only take over-the-counter or prescription medicines as directed by your health care provider.  Do not take laxatives unless directed to do so by your health care provider.  Try a clear liquid diet (broth, tea, or water) as directed by your health care provider. Slowly move to a bland diet as tolerated. SEEK MEDICAL CARE IF:  You have unexplained abdominal pain.  You have abdominal pain associated with nausea or diarrhea.  You have pain when you urinate or have a bowel movement.  You experience abdominal pain that wakes you in the night.  You have abdominal pain that is worsened or improved by eating food.  You have abdominal pain that is worsened with eating fatty foods.  You have a fever. SEEK IMMEDIATE MEDICAL CARE IF:   Your pain  does not go away within 2 hours.  You keep throwing up (vomiting).  Your pain is felt only in portions of the abdomen, such as the right side or the left lower portion of the abdomen.  You pass bloody or black tarry stools. MAKE SURE YOU:  Understand these instructions.   Will watch your condition.   Will get help right away if you are not doing well or get worse.  Document Released: 11/03/2004 Document Revised: 01/29/2013 Document Reviewed: 10/03/2012 [facility] Patient Information 2015 [address], [place]. This information is not intended to replace advice given to you by your health care provider. Make sure you discuss any questions you have with your health care provider.  Constipation Constipation is when a person has fewer than three bowel movements a week, has difficulty having a bowel movement, or has stools that are dry, hard, or larger than normal. As people grow older, constipation is more common. If you try to fix constipation with medicines that make you have a bowel movement (laxatives), the problem may get worse. Long-term laxative use may cause the muscles of the colon to become weak. A low-fiber diet, not taking in enough fluids, and taking certain medicines may make constipation worse.  CAUSES   Certain medicines, such as antidepressants, pain medicine, iron supplements, antacids, and water pills.   Certain diseases, such as diabetes, irritable bowel syndrome (IBS), thyroid disease, or depression.   Not drinking enough water.   Not eating enough fiber-rich foods.   Stress or travel.   Lack of  physical activity or exercise.   Ignoring the urge to have a bowel movement.   Using laxatives too much.  SIGNS AND SYMPTOMS   Having fewer than three bowel movements a week.   Straining to have a bowel movement.   Having stools that are hard, dry, or larger than normal.   Feeling full or bloated.   Pain in the lower abdomen.   Not feeling relief after  having a bowel movement.  DIAGNOSIS  Your health care provider will take a medical history and perform a physical exam. Further testing may be done for severe constipation. Some tests may include:  A barium enema X-ray to examine your rectum, colon, and, sometimes, your small intestine.   A sigmoidoscopy to examine your lower colon.   A colonoscopy to examine your entire colon. TREATMENT  Treatment will depend on the severity of your constipation and what is causing it. Some dietary treatments include drinking more fluids and eating more fiber-rich foods. Lifestyle treatments may include regular exercise. If these diet and lifestyle recommendations do not help, your health care provider may recommend taking over-the-counter laxative medicines to help you have bowel movements. Prescription medicines may be prescribed if over-the-counter medicines do not work.  HOME CARE INSTRUCTIONS   Eat foods that have a lot of fiber, such as fruits, vegetables, whole grains, and beans.  Limit foods high in fat and processed sugars, such as french fries, hamburgers, cookies, candies, and soda.   A fiber supplement may be added to your diet if you cannot get enough fiber from foods.   Drink enough fluids to keep your urine clear or pale yellow.   Exercise regularly or as directed by your health care provider.   Go to the restroom when you have the urge to go. Do not hold it.   Only take over-the-counter or prescription medicines as directed by your health care provider. Do not take other medicines for constipation without talking to your health care provider first.  [place] IF:   You have bright red blood in your stool.   Your constipation lasts for more than 4 days or gets worse.   You have abdominal or rectal pain.   You have thin, pencil-like stools.   You have unexplained weight loss. MAKE SURE YOU:   Understand these instructions.  Will watch your  condition.  Will get help right away if you are not doing well or get worse. Document Released: 10/23/2003 Document Revised: 01/29/2013 Document Reviewed: 11/05/2012 [facility] Patient Information 2015 [place], [place]. This information is not intended to replace advice given to you by your health care provider. Make sure you discuss any questions you have with your health care provider.

## 2013-08-05 NOTE — ED Provider Notes (Signed)
CSN: [id number]     Arrival date & time 08/05/13  0555 History   First MD Initiated Contact with Patient 08/05/13 [id number]     Chief Complaint  Patient presents with  . Hypertension     (Consider location/radiation/quality/duration/timing/severity/associated sxs/prior Treatment) HPI  Patient with hx DM, diverticulitis, HTN, blood clots on plavix presents with lower abdominal pain, constipation, BRBPR, nausea, one episode of vomiting (emesis was undigested food), subjective fevers, night sweats.  Has had urinary urgency x 5 days.  Pain is constant and dull, worse with palpation, no radiation. Has used 3 enemas and miralax without stool produced.  She did have light blood with the second enema.  Has taken tramadol without relief.  States this feels like a bowel blockage.  States she has had diverticulitis and UTI and it does not feel similar.   Has had colonoscopy approximately 4 years ago by Dr Watt Climes, has had polyps in the past.  Abdominal surgical history: hysterectomy, cholecystectomy, appendectomy.   Past Medical History  Diagnosis Date  . DVT (deep venous thrombosis)   . Asthma   . Hypothyroidism   . Anxiety   . Depression   . Sleep apnea     cpap disontinued; test done 07/14/2008 ordered per  Byrum  . Stroke     tia and stroke. Weakness Rt hand  . H/O hiatal hernia   . Arthritis   . Anemia   . Complication of anesthesia     hard to wake up after anesthesia  . 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 Hyperlipidemia   . Diabetes mellitus type 2, insulin dependent   . Hypertension associated with diabetes   . Sleep apnea   . Glaucoma   . Cataract   . Diabetic neuropathy   . Palpitations     Cardionet monitor - revealed mostly normal sinus rhythm, sinus bradycardia with first-degree A-V block heart rates mostly in the 50s and 60s with some 70s. No arrhythmias, PVCs or PACs noted.  Marland Kitchen TIA (transient  ischemic attack) March 2014  . Diastolic dysfunction, left ventricle     mild LVH, EF 55% by echo; no ischemia by stress 01/2010 echo [facility])   Past Surgical History  Procedure Laterality Date  . Abdominal hysterectomy    . Cholecystectomy    . Back surgery      steroid inj  . Tonsillectomy    . Appendectomy    . Esophagogastroduodenoscopy  08/09/2011    Procedure: ESOPHAGOGASTRODUODENOSCOPY (EGD);  Surgeon: Jeryl Columbia, MD;  Location: [facility];  Service: Endoscopy;  Laterality: N/A;  . Hot hemostasis  08/09/2011    Procedure: HOT HEMOSTASIS (ARGON PLASMA COAGULATION/BICAP);  Surgeon: Jeryl Columbia, MD;  Location: [facility];  Service: Endoscopy;  Laterality: N/A;  . Joint replacement      both knees  . Trigger finger release  11/22/2011    Procedure: RELEASE TRIGGER FINGER/A-1 PULLEY;  Surgeon: Meredith Pel, MD;  Location: [facility];  Service: Orthopedics;  Laterality: Left;  Left trigger thumb release  . Eye surgery    . Cardiac catheterization  02/14/2012    no evidence of obstructive coronary disease ,low EDP with normal EF  . Doppler echocardiography  04/19/2012    EF 0000000; normal diastolic pressures, no regional wall motion motion abnormalities  . Nm myocar perf wall motion  01/26/2012    EF 79%,LV normal,ST SEGMENT CHANGE SUGGESTIVE  OF ISCHEMIA.  Eusebio Me doppler  05/29/2012    RIGHT EXTREM. NORMAL VENOUS DUPLEX   Family History  Problem Relation Age of Onset  . Cancer Mother   . Cancer - Prostate Father    History  Substance Use Topics  . Smoking status: Never Smoker   . Smokeless tobacco: Never Used  . Alcohol Use: No   OB History   Grav Para Term Preterm Abortions TAB SAB Ect Mult Living                 Review of Systems  All other systems reviewed and are negative.     Allergies  Iodine; Metoclopramide hcl; Sulfa antibiotics; Iohexol; Lactose intolerance (gi); and Lansoprazole  Home Medications   Prior to Admission medications   Medication Sig  Start Date End Date Taking? Authorizing Provider  albuterol (PROVENTIL HFA;VENTOLIN HFA) 108 (90 BASE) MCG/ACT inhaler Inhale 2 puffs into the lungs every 6 (six) hours as needed for wheezing.    Historical Provider, MD  busPIRone (BUSPAR) 7.5 MG tablet Take 7.5 mg by mouth daily.     Historical Provider, MD  Cholecalciferol (VITAMIN D-3 PO) Take 1 tablet by mouth daily.     Historical Provider, MD  clonazePAM (KLONOPIN) 0.5 MG tablet Take 0.5-1 mg by mouth 2 (two) times daily. 1 mg (2 tablets) in the morning and 0.5 mg (1 tablet) at night    Historical Provider, MD  clopidogrel (PLAVIX) 75 MG tablet Take 1 tablet (75 mg total) by mouth daily. 06/26/12   Garvin Fila, MD  dicyclomine (BENTYL) 10 MG capsule Take 10 mg by mouth 3 (three) times daily as needed for spasms.     Historical Provider, MD  Dicyclomine HCl (BENTYL PO) Take 1 tablet by mouth daily.    Historical Provider, MD  esomeprazole (NEXIUM) 40 MG capsule Take 40 mg by mouth daily before breakfast.    Historical Provider, MD  glipiZIDE (GLUCOTROL) 10 MG tablet Take 20 mg by mouth 2 (two) times daily before a meal.     Historical Provider, MD  HYDROCHLOROTHIAZIDE PO Take 1 tablet by mouth daily as needed (for swelling).    Historical Provider, MD  HYDROcodone-acetaminophen (NORCO/VICODIN) 5-325 MG per tablet Take 1 tablet by mouth every 6 (six) hours as needed. 05/11/13   Merryl Hacker, MD  hyoscyamine (LEVBID) 0.375 MG 12 hr tablet Take 0.375 mg by mouth daily.    Historical Provider, MD  ibuprofen (ADVIL,MOTRIN) 200 MG tablet Take 200 mg by mouth every 6 (six) hours as needed for mild pain.    Historical Provider, MD  insulin NPH (HUMULIN N,NOVOLIN N) 100 UNIT/ML injection Inject 10-20 Units into the skin 2 (two) times daily before a meal. Inject 20 units in the morning and 10 units at night.    Historical Provider, MD  insulin regular (NOVOLIN R,HUMULIN R) 100 units/mL injection Inject 20-30 Units into the skin 3 (three) times daily  before meals. Inject 30 units at breakfast, 20 units at lunchtime, and 20 units at dinner.    Historical Provider, MD  levothyroxine (SYNTHROID, LEVOTHROID) 100 MCG tablet Take 100 mcg by mouth daily.    Historical Provider, MD  lidocaine (LIDODERM) 5 % Place 3 patches onto the skin daily. Remove & Discard patch within 12 hours or as directed by MD    Historical Provider, MD  meclizine (ANTIVERT) 12.5 MG tablet Take 12.5 mg by mouth 3 (three) times daily as needed for dizziness.     Historical  Provider, MD  nortriptyline (PAMELOR) 50 MG capsule Take 50 mg by mouth at bedtime.    Historical Provider, MD  polyethylene glycol (MIRALAX / GLYCOLAX) packet Take 17 g by mouth daily as needed for mild constipation.    Historical Provider, MD  potassium chloride SA (K-DUR,KLOR-CON) 20 MEQ tablet Take 20 mEq by mouth daily.     Historical Provider, MD  Probiotic Product (PROBIOTIC PO) Take 1 capsule by mouth daily.     Historical Provider, MD  rosuvastatin (CRESTOR) 20 MG tablet Take 20 mg by mouth daily.    Historical Provider, MD  Sennosides (EX-LAX PO) Take 1 tablet by mouth daily.     Historical Provider, MD  sucralfate (CARAFATE) 1 G tablet Take 1 g by mouth daily.     Historical Provider, MD  TERCONAZOLE VA Place 1 application vaginally daily as needed (for yeast infections).     Historical Provider, MD  traMADol-acetaminophen (ULTRACET) 37.5-325 MG per tablet Take 1-2 tablets by mouth every 6 (six) hours as needed for moderate pain. Take 1 to 2  Tablets every 6 to 8 hrs as needed for pain    Historical Provider, MD  Valsartan (DIOVAN PO) Take 1 tablet by mouth daily.    Historical Provider, MD   BP 149/87  Pulse 51  Temp(Src) 97.8 F (36.6 C) (Oral)  Resp 16  SpO2 97% Physical Exam  Nursing note and vitals reviewed. Constitutional: She appears well-developed and well-nourished. No distress.  HENT:  Head: Normocephalic and atraumatic.  Neck: Neck supple.  Cardiovascular: Normal rate and  regular rhythm.   Pulmonary/Chest: Effort normal and breath sounds normal. No respiratory distress. She has no wheezes. She has no rales.  Abdominal: Soft. She exhibits no distension. There is tenderness in the left upper quadrant and left lower quadrant. There is CVA tenderness (left). There is no rebound and no guarding.  Genitourinary: Rectal exam shows external hemorrhoid and internal hemorrhoid. Rectal exam shows no tenderness.  Multiple internal and external hemorrhoids.  No stool on glove.  No stool impaction.    Musculoskeletal: She exhibits edema (mild edema of the bilateral ankles).  Neurological: She is alert.  Skin: She is not diaphoretic.    ED Course  Procedures (including critical care time) Labs Review Labs Reviewed  CBC WITH DIFFERENTIAL - Abnormal; Notable for the following:    RBC 3.45 (*)    Hemoglobin 10.4 (*)    HCT 33.2 (*)    All other components within normal limits  COMPREHENSIVE METABOLIC PANEL - Abnormal; Notable for the following:    Glucose, Bld 162 (*)    Calcium 8.3 (*)    Albumin 3.0 (*)    GFR calc non Af Amer 52 (*)    GFR calc Af Amer 60 (*)    All other components within normal limits  URINALYSIS, ROUTINE W REFLEX MICROSCOPIC - Abnormal; Notable for the following:    APPearance HAZY (*)    All other components within normal limits  CBG MONITORING, ED - Abnormal; Notable for the following:    Glucose-Capillary 164 (*)    All other components within normal limits  URINE CULTURE  LIPASE, BLOOD  POC OCCULT BLOOD, ED    Imaging Review Ct Abdomen Pelvis Wo Contrast  08/05/2013   ADDENDUM REPORT: 08/05/2013 13:28  ADDENDUM: Sagittal and coronal reformats are now available. These demonstrate no further findings.   Electronically Signed   By: Abigail Miyamoto M.D.   On: 08/05/2013 13:28   08/05/2013  CLINICAL DATA:  Bilateral lower abdominal pain.  Constipation.  EXAM: CT ABDOMEN AND PELVIS WITHOUT CONTRAST  TECHNIQUE: Multidetector CT imaging of the  abdomen and pelvis was performed following the standard protocol without IV contrast.  COMPARISON:  11/26/2010  FINDINGS: Lower Chest: Mild volume loss the lung bases. Mild cardiomegaly. Small hiatal hernia.  Abdomen/Pelvis: Normal noncontrast appearance of the liver, spleen, distal stomach. Mild pancreatic atrophy. Cholecystectomy, without biliary ductal dilatation. Normal adrenal glands and kidneys.  Aortic and branch vessel atherosclerosis. No retroperitoneal or retrocrural adenopathy. Colonic stool burden suggests constipation. Normal terminal ileum. Prior appendectomy. Consider small bowel  No pelvic adenopathy. Normal urinary bladder. Hysterectomy. Mild pelvic floor laxity. No adnexal mass or significant free fluid.  Bones/Musculoskeletal:  No acute osseous abnormality.  IMPRESSION: 1. Possible constipation. No other explanation for abdominal pelvic pain. 2. Mild pelvic floor laxity. 3. Please note that sagittal and coronal reformatted images were not available at the time of this initial dictation, secondary to server issues. An addendum will subsequently be created when these become available.  Electronically Signed: By: Abigail Miyamoto M.D. On: 08/05/2013 11:41     EKG Interpretation None      Filed Vitals:   08/05/13 0630  BP: 153/63  Pulse: 47  Temp:   Resp: 17     7:24 AM Discussed pt with Dr Doy Mince.   1:23 PM Reviewing results with patient.  CT reading discussed with Dr Jobe Igo - "Consider small bowel" was a dictation/macro error, he states small bowel is normal.  No obstruction.   2:02 PM Pt has had small soft BM in ED.   MDM   Final diagnoses:  Constipation, unspecified constipation type  Generalized abdominal pain    Afebrile nontoxic patient with left sided abdominal pain, constipation, small BRBPR, nausea.  She is concerned about her BP but her BP is 153/63 in ED.  CT abd/pelvis shows constipation.  Labs, UA unremarkable. D/C home with mag citrate, norco, zofran.  PCP  follow up.  Pt advised narcotics may make constipation worse.  Discussed result, findings, treatment, and follow up  with patient.  Pt given return precautions.  Pt verbalizes understanding and agrees with plan.        [place], PA-C 08/05/13 1403

## 2013-08-05 NOTE — ED Provider Notes (Signed)
Medical screening examination/treatment/procedure(s) were conducted as a shared visit with non-physician practitioner(s) and myself.  I personally evaluated the patient during the encounter.   EKG Interpretation None      73 year old female presenting with abdominal pain. She states she has not had a formed bowel movement in several days. She has had a small amount of watery stool a few times. On exam, nontoxic, in no distress,  normal respiratory effort, normal perfusion, abdomen soft but diffusely tender, no rigidity, rebound, or guarding. Plan labs and CT.  CT negative, dc home.  Clinical Impression: 1. Constipation, unspecified constipation type   2. Generalized abdominal pain       Houston Siren III, MD 08/05/13 [phone]

## 2013-08-05 NOTE — ED Notes (Signed)
Pt. reports elevated blood pressure this morning 214/98 with headache and constipation ( last BM last week ) . Pt. also reported bilateral lower leg edema onset yesterday .

## 2013-08-06 LAB — URINE CULTURE: Colony Count: 15000

## 2013-08-06 NOTE — ED Provider Notes (Signed)
Medical screening examination/treatment/procedure(s) were performed by non-physician practitioner and as supervising physician I was immediately available for consultation/collaboration.   EKG Interpretation None        Mariea Clonts, MD 08/06/13 [phone]

## 2013-08-15 ENCOUNTER — Telehealth: Payer: Self-pay | Admitting: Cardiology

## 2013-08-15 ENCOUNTER — Telehealth: Payer: Self-pay | Admitting: Cardiovascular Disease

## 2013-08-15 NOTE — Telephone Encounter (Signed)
Error

## 2013-08-15 NOTE — Telephone Encounter (Signed)
Closed encounter °

## 2013-08-19 ENCOUNTER — Ambulatory Visit: Payer: Medicare HMO | Admitting: Cardiology

## 2013-08-19 ENCOUNTER — Ambulatory Visit (INDEPENDENT_AMBULATORY_CARE_PROVIDER_SITE_OTHER): Payer: Medicare HMO | Admitting: Cardiology

## 2013-08-19 ENCOUNTER — Encounter: Payer: Self-pay | Admitting: Cardiology

## 2013-08-19 VITALS — BP 120/60 | HR 70 | Ht 67.5 in

## 2013-08-19 DIAGNOSIS — M25473 Effusion, unspecified ankle: Secondary | ICD-10-CM

## 2013-08-19 DIAGNOSIS — I519 Heart disease, unspecified: Secondary | ICD-10-CM

## 2013-08-19 DIAGNOSIS — I493 Ventricular premature depolarization: Secondary | ICD-10-CM

## 2013-08-19 DIAGNOSIS — G4730 Sleep apnea, unspecified: Secondary | ICD-10-CM

## 2013-08-19 DIAGNOSIS — I10 Essential (primary) hypertension: Secondary | ICD-10-CM

## 2013-08-19 DIAGNOSIS — I4949 Other premature depolarization: Secondary | ICD-10-CM

## 2013-08-19 DIAGNOSIS — E785 Hyperlipidemia, unspecified: Secondary | ICD-10-CM

## 2013-08-19 DIAGNOSIS — R609 Edema, unspecified: Secondary | ICD-10-CM

## 2013-08-19 DIAGNOSIS — K5903 Drug induced constipation: Secondary | ICD-10-CM

## 2013-08-19 DIAGNOSIS — R600 Localized edema: Secondary | ICD-10-CM

## 2013-08-19 DIAGNOSIS — K5909 Other constipation: Secondary | ICD-10-CM

## 2013-08-19 MED ORDER — NYSTATIN 100000 UNIT/ML MT SUSP: 5.0000 mL | Freq: Four times a day (QID) | OROMUCOSAL | Status: DC

## 2013-08-19 NOTE — Patient Instructions (Signed)
Compression stocking 20 -30  mmhg   Your physician wants you to follow-up in 6 month Dr Ellyn Hack 30 min appt.  You will receive a reminder letter in the mail two months in advance. If you don't receive a letter, please call our office to schedule the follow-up appointment.

## 2013-08-21 DIAGNOSIS — K5903 Drug induced constipation: Secondary | ICD-10-CM | POA: Insufficient documentation

## 2013-08-21 DIAGNOSIS — M25473 Effusion, unspecified ankle: Secondary | ICD-10-CM | POA: Insufficient documentation

## 2013-08-21 NOTE — Assessment & Plan Note (Signed)
Blood pressure is well-controlled today. Despite being uncomfortable, [name] e she has had in a while. she's had in a while. Continue with HCTZ for now, however she continued to note constipation, we may want to voice. His also on ARB and Inderal as a beta blocker for the results of her headaches.

## 2013-08-21 NOTE — Assessment & Plan Note (Signed)
I am reluctant for her to take too much diuretic. She has seen how well support hose and compression stocking to help her husband's edema. We will prescribe 20-30 mmHg compression stockings

## 2013-08-21 NOTE — Assessment & Plan Note (Signed)
Only taking Lasix when necessary but usually using HCTZ. She is on the reduction of the RV. It would make sense that with diastolic dysfunction having diaphragmatic incursion into the thoracic cavity would make her more dyspneic.

## 2013-08-21 NOTE — Assessment & Plan Note (Signed)
Uses CPAP 

## 2013-08-21 NOTE — Assessment & Plan Note (Signed)
Much well controlled with Inderal

## 2013-08-21 NOTE — Assessment & Plan Note (Signed)
Monitored by PCP. On Crestor

## 2013-08-21 NOTE — Progress Notes (Signed)
PCP: Tara Reel, MD  Clinic Note: Chief Complaint  Patient presents with  . 6 month visit    chest pain , back pain,  sometimes sob ,  edema---, post hosp -constipation   HPI: Tara Jackson is a 73 y.o. female with a Cardiovascular Problem List below who presents today for six-month cardiology followup. When I last saw her in January she was doing relatively well. She was under quite a bit of stress because of concern for her husbands health. He was in the process of undergoing evaluation and treatment for either prostate or bladder cancer.  Interval History: She presents today with nonspecific have unusual symptoms of discomfort in her abdomen and chest with some dyspnea. She says that her biggest issue has been constipation on. She has limited mobility now and spends quite a bit time sitting instead of being up and about. This is mostly because of her cervical and lumbar back pain. She refuses to undergo any further surgeries and therefore is very limited functionally. She is taking tramadol as well as other medications simply didn't feel the pain in his suffering from constipation as a result. It sounds like when her abdomen feels full and bloated for consultation pushes on her diaphragm and makes her feel short of breath. She still has intermittent twinges of chest discomfort here and there which we've evaluated in the past. She otherwise really denies any significant real cardiac symptoms of chest tightness a pressure consistent with angina. No PND, orthopnea but does have trace ankle edema which is chronic she asked about compression stockings/support hose. No syncope or near-syncope, TIA or amaurosis fugax symptoms. No melena, hematochezia, hematuria, epistaxis. No rapid or irregular heart rate/palpitations.  Past Medical History  Diagnosis Date  . DVT (deep venous thrombosis)   . Asthma   . Hypothyroidism   . Anxiety   . Depression   . Sleep apnea     cpap disontinued; test done  07/14/2008 ordered per  Byrum  . Stroke     tia and stroke. Weakness Rt hand  . H/O hiatal hernia   . Arthritis   . Anemia   . Complication of anesthesia     hard to wake up after anesthesia  . 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 Hyperlipidemia   . Diabetes mellitus type 2, insulin dependent   . Hypertension associated with diabetes   . Sleep apnea   . Glaucoma   . Cataract   . Diabetic neuropathy   . Palpitations     Cardionet monitor - revealed mostly normal sinus rhythm, sinus bradycardia with first-degree A-V block heart rates mostly in the 50s and 60s with some 70s. No arrhythmias, PVCs or PACs noted.  Marland Kitchen TIA (transient ischemic attack) March 2014  . Diastolic dysfunction, left ventricle     mild LVH, EF 55% by echo; no ischemia by stress 01/2010 echo [facility])    Prior Cardiac Evaluation and Past Surgical History: Past Surgical History  Procedure Laterality Date  . Cardiac catheterization  02/14/2012    no evidence of obstructive coronary disease ,low EDP with normal EF  . Doppler echocardiography  04/19/2012    EF 0000000; normal diastolic pressures, no regional wall motion motion abnormalities  . Nm myocar perf wall motion  01/26/2012    EF 79%,LV normal,ST SEGMENT CHANGE SUGGESTIVE OF ISCHEMIA.  Eusebio Me doppler  05/29/2012  RIGHT EXTREM. NORMAL VENOUS DUPLEX   MEDICATIONS AND ALLERGIES REVIEWED IN EPIC No Change in Social and Family History  ROS: A comprehensive Review of Systems - Negative except GI symptoms noted above been bloating and constipation with abdominal distention. as a result of her limited mobility she has put on some weight. She made a concerted effort since January to adjust her diet at least as been able to cut back down some of the weight as noted below. She has also been having intermittent headaches, but not the same extent as before.  Wt Readings from Last 3 Encounters:    05/11/13 285 lb (129.275 kg)  02/08/13 289 lb (131.09 kg)  11/15/12 275 lb (124.739 kg)   PHYSICAL EXAM BP 120/60  Pulse 70  Ht 5' 7.5" (1.715 m) General appearance: alert, cooperative, appears stated age, no distress, morbidly obese and mostly truncal obesity. Pleasant mood and affect. Well groomed. Seems to be in better spirits than last visit.  Neck: no carotid bruit and no JVD  Lungs: clear to auscultation bilaterally, normal percussion bilaterally and nonlabored, good air movement.  Heart: regular rate and rhythm, S1, S2 normal, no murmur, click, rub or gallop and normal apical impulse  Abdomen: soft, non-tender; bowel sounds normal; no masses, no organomegaly and truncal obesity noted.  Extremities: extremities normal, atraumatic, no cyanosis; trace lower ankle only edema. Not really true bulimia. Pulses: 2+ and symmetric  Neurologic: Grossly normal   Adult ECG Report  Rate:  70, and a sore with mild nonspecific ST and T-wave changes. Cannot rule out inferior MI, age undetermined.  Narrative Interpretation:  no significant change from last study   Recent Labs: August 05, 2013:     essentially normal CMP and CBC with the exception of glucose of 162.   ASSESSMENT / PLAN: Constipation due to pain medication Most of her symptoms are really revolve around her having the significant constipation. We spent quite some time discussing with her between myself and the nurse  practitioner student discussing different bowel regimen options including the use of prunes and prune juice, fiber as well as Metamucil or Citrucel. Ensuring that she drinks plenty of water. Unfortunately she has significant back pain which also limits her mobility which decreases gastric mobility.  I would defer further recommendations to her PCP on. But maybe consider Reglan if she is having nausea   Essential hypertension Blood pressure is well-controlled today. Despite being uncomfortable, [name] e she has had  in a while. she's had in a while. Continue with HCTZ for now, however she continued to note constipation, we may want to voice. His also on ARB and Inderal as a beta blocker for the results of her headaches.  Diastolic dysfunction, left ventricle Only taking Lasix when necessary but usually using HCTZ. She is on the reduction of the RV. It would make sense that with diastolic dysfunction having diaphragmatic incursion into the thoracic cavity would make her more dyspneic.  PVC's (premature ventricular contractions) Much well controlled with Inderal  Hyperlipidemia with target LDL less than 130 Monitored by PCP. On Crestor  SLEEP APNEA Uses CPAP  Ankle edema I am reluctant for her to take too much diuretic. She has seen how well support hose and compression stocking to help her husband's edema. We will prescribe 20-30 mmHg compression stockings    Orders Placed This Encounter  Procedures  . Compression stockings    Compression stocking  20-30 mmhg  . EKG 12-Lead   Meds ordered this  encounter  Medications  . valsartan (DIOVAN) 320 MG tablet    Sig: Take 320 mg by mouth daily.  Marland Kitchen nystatin (MYCOSTATIN) 100000 UNIT/ML suspension    Sig: Take 5 mLs (500,000 Units total) by mouth 4 (four) times daily.    Dispense:  120 mL    Refill:  0    Followup:  12  months  Taffy Delconte W. Ellyn Hack, M.D., M.S. Interventional Cardiologist [facility]

## 2013-08-21 NOTE — Assessment & Plan Note (Signed)
Most of her symptoms are really revolve around her having the significant constipation. We spent quite some time discussing with her between myself and the nurse  practitioner student discussing different bowel regimen options including the use of prunes and prune juice, fiber as well as Metamucil or Citrucel. Ensuring that she drinks plenty of water. Unfortunately she has significant back pain which also limits her mobility which decreases gastric mobility.  I would defer further recommendations to her PCP on. But maybe consider Reglan if she is having nausea

## 2013-09-19 ENCOUNTER — Encounter: Payer: Self-pay | Admitting: Nurse Practitioner

## 2013-09-19 ENCOUNTER — Ambulatory Visit (INDEPENDENT_AMBULATORY_CARE_PROVIDER_SITE_OTHER): Payer: Medicare HMO | Admitting: Nurse Practitioner

## 2013-09-19 ENCOUNTER — Other Ambulatory Visit: Payer: Self-pay | Admitting: Nurse Practitioner

## 2013-09-19 VITALS — BP 171/82 | HR 83 | Ht 67.5 in | Wt 294.4 lb

## 2013-09-19 DIAGNOSIS — R519 Headache, unspecified: Secondary | ICD-10-CM | POA: Insufficient documentation

## 2013-09-19 DIAGNOSIS — G459 Transient cerebral ischemic attack, unspecified: Secondary | ICD-10-CM

## 2013-09-19 DIAGNOSIS — R251 Tremor, unspecified: Secondary | ICD-10-CM

## 2013-09-19 DIAGNOSIS — R51 Headache: Secondary | ICD-10-CM | POA: Insufficient documentation

## 2013-09-19 DIAGNOSIS — G2581 Restless legs syndrome: Secondary | ICD-10-CM | POA: Insufficient documentation

## 2013-09-19 DIAGNOSIS — R259 Unspecified abnormal involuntary movements: Secondary | ICD-10-CM

## 2013-09-19 MED ORDER — PRIMIDONE 50 MG PO TABS: 50.0000 mg | ORAL_TABLET | Freq: Three times a day (TID) | ORAL | Status: DC

## 2013-09-19 MED ORDER — CLOPIDOGREL BISULFATE 75 MG PO TABS: 75.0000 mg | ORAL_TABLET | Freq: Every day | ORAL | Status: DC

## 2013-09-19 NOTE — Patient Instructions (Addendum)
PLAN:  Continue clopidogrel 75 mg orally every day  for secondary stroke prevention and maintain strict control of hypertension with blood pressure goal below 140/90, diabetes with hemoglobin A1c goal below7 and lipids with LDL cholesterol goal below 70 mg/dL. Followup in the future with Dr. Leonie Man in 6 months, sooner as needed.  Discontinue Propranolol for tremor and headache prevention. Start Primidone 50 mg at bedtime x 1 week, then increase to 50 mg morning and night.  After 1 week if tremor still not adequately controlled, increase to 3 times a day.  Stroke Prevention Some medical conditions and behaviors are associated with an increased chance of having a stroke. You may prevent a stroke by making healthy choices and managing medical conditions. HOW CAN I REDUCE MY RISK OF HAVING A STROKE?   Stay physically active. Get at least 30 minutes of activity on most or all days.  Do not smoke. It may also be helpful to avoid exposure to secondhand smoke.  Limit alcohol use. Moderate alcohol use is considered to be:  No more than 2 drinks per day for men.  No more than 1 drink per day for nonpregnant women.  Eat healthy foods. This involves:  Eating 5 or more servings of fruits and vegetables a day.  Making dietary changes that address high blood pressure (hypertension), high cholesterol, diabetes, or obesity.  Manage your cholesterol levels.  Making food choices that are high in fiber and low in saturated fat, trans fat, and cholesterol may control cholesterol levels.  Take any prescribed medicines to control cholesterol as directed by your health care provider.  Manage your diabetes.  Controlling your carbohydrate and sugar intake is recommended to manage diabetes.  Take any prescribed medicines to control diabetes as directed by your health care provider.  Control your hypertension.  Making food choices that are low in salt (sodium), saturated fat, trans fat, and cholesterol is  recommended to manage hypertension.  Take any prescribed medicines to control hypertension as directed by your health care provider.  Maintain a healthy weight.  Reducing calorie intake and making food choices that are low in sodium, saturated fat, trans fat, and cholesterol are recommended to manage weight.  Stop drug abuse.  Avoid taking birth control pills.  Talk to your health care provider about the risks of taking birth control pills if you are over 56 years old, smoke, get migraines, or have ever had a blood clot.  Get evaluated for sleep disorders (sleep apnea).  Talk to your health care provider about getting a sleep evaluation if you snore a lot or have excessive sleepiness.  Take medicines only as directed by your health care provider.  For some people, aspirin or blood thinners (anticoagulants) are helpful in reducing the risk of forming abnormal blood clots that can lead to stroke. If you have the irregular heart rhythm of atrial fibrillation, you should be on a blood thinner unless there is a good reason you cannot take them.  Understand all your medicine instructions.  Make sure that other conditions (such as anemia or atherosclerosis) are addressed. SEEK IMMEDIATE MEDICAL CARE IF:   You have sudden weakness or numbness of the face, arm, or leg, especially on one side of the body.  Your face or eyelid droops to one side.  You have sudden confusion.  You have trouble speaking (aphasia) or understanding.  You have sudden trouble seeing in one or both eyes.  You have sudden trouble walking.  You have dizziness.  You  have a loss of balance or coordination.  You have a sudden, severe headache with no known cause.  You have new chest pain or an irregular heartbeat. Any of these symptoms may represent a serious problem that is an emergency. Do not wait to see if the symptoms will go away. Get medical help at once. Call your local emergency services (911 in [place]  Do not drive yourself to the hospital. Document Released: 03/03/2004 Document Revised: 06/10/2013 Document Reviewed: 07/27/2012 [facility] Patient Information 2015 [place], [place]. This information is not intended to replace advice given to you by your health care provider. Make sure you discuss any questions you have with your health care provider.

## 2013-09-19 NOTE — Progress Notes (Signed)
PATIENT: Tara Jackson DOB: 07-20-1940  REASON FOR VISIT: routine follow up for headaches, tia HISTORY FROM: patient  HISTORY OF PRESENT ILLNESS: Tara Jackson is a 73 year old African lady who developed new-onset chronic daily headaches for the last 4 weeks. She states that she had some dental extractions done of her upper molar teeth about a week ago following which she noticed worsening of her pain which predominantly is in the temporal head regions but does spread all over. She states the pain is constant, severe. At times it is pulsatile. She was seen in the emergency room at [facility] on 07/23/12. She was treated with IV Dilaudid, Phenergan, Zofran and Benadryl in no hopping on the subtotal benefit. CT scan of the maxillary region showed no evidence of abscess associated with a total extraction but showed postoperative changes with communication between the oral cavity and nasal maxillary sinus. There was evidence of sinusitis seen. CT scan of the head showed no acute abnormality. The headache is accompanied by some nausea and light sensitivity and occasional vomiting. The headache remains unchanged throughout the day. She is currently being treated with antibiotic course for a swollen gums. She has tried oxycodone, Ultram and Tylenol which have not helped. She does also have history of degenerative cervical spine disease she does complain of neck pain and muscle tightness and spasm as well. She has had 4 minor falls since her last visit 2 months ago. She even had a brief episode of blacking out yesterday but her husband caught her. She found that her blood pressure was low and her primary care physician has reduced her blood pressure medication since then. She denies any prior history of migraine headaches of similar headaches in the past. She denies any blurred vision or loss of vision accompanying her headaches. 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   UPDATE 11/15/12 (LL): Tara Jackson comes to office for revisit for daily headache after wisdom tooth extraction. MRI cervical spine shows prominent spondylitic changes mostly at C5-6 and C4-5. with lateral disc osteophyte protrusions resulting in moderate foraminal narrowing on the right and mild canal stenosis without definite compression. MRI brain showed mild changes of microvascular ischemia. Advanced changes of chronic paranasal sinusitis. She states that her headaches are much better, about 84 % better than last visit. She does not have a headache today. She is using Tizanadine 4 mg for neck muscle tension. She had a recent sleep study which showed snoring but AHI within normal limits. Restless legs syndrome was suggested due to frequent leg movements. She states at night sometimes she feels like something is crawling on her lower legs and she feels like she must move them.   UPDATE 09/19/13 (LL): Since last visit Tara Jackson states that she has had some problems with abdominal pain and constipation. She recently went to the ER with abdominal pain and constipation times one week, despite the use of several enemas. Her headaches have not been bad in the last few months. She had a epidural pain injection in May at pain management. She denies any focal TIA or stroke symptoms, but has numerous symptom complaints on review of systems. She is concerned about leg swelling and abdominal swelling, although her weight is up another 21 pounds since last visit. She states she has been having frequent falls without injury, but when she  falls it's difficult for her to get up by herself and even difficult for her husband to get her up. She does not exercise and uses a motorized wheelchair his most of the time. Of particular concern today is an increase in hand tremor right greater than left, which is present at rest but more intense with action. It is  causing her embarrassment.  ROS:  14 system review of systems is positive for ringing in the ears, short of breath, wheezing, snoring, constipation, feeling hot, feeling cold, increased thirst, flushing, allergies, itching, confusion, headache, weakness, dizziness, anxiety, decreased energy and change in appetite, disinterest in activities, and hallucinations when she has low blood sugar.   ALLERGIES: Allergies  Allergen Reactions  . Iodine Other (See Comments)    Makes unconscious  . Metoclopramide Hcl Other (See Comments)    suicidal  . Sulfa Antibiotics Hives  . Iohexol      Code: SOB, Desc: cardiac arrest w/ iv contrast, has never used 13 hr prep//alice calhoun, Onset Date: [id number]   . Lactose Intolerance (Gi) Diarrhea  . Lansoprazole Nausea And Vomiting    HOME MEDICATIONS: Outpatient Prescriptions Prior to Visit  Medication Sig Dispense Refill  . albuterol (PROVENTIL HFA;VENTOLIN HFA) 108 (90 BASE) MCG/ACT inhaler Inhale 2 puffs into the lungs every 6 (six) hours as needed for wheezing.      . busPIRone (BUSPAR) 7.5 MG tablet Take 7.5 mg by mouth daily.       . Cholecalciferol (VITAMIN D-3 PO) Take 1 tablet by mouth daily.       . clonazePAM (KLONOPIN) 0.5 MG tablet Take 0.5-1 mg by mouth 2 (two) times daily. 1 mg (2 tablets) in the morning and 0.5 mg (1 tablet) at night      . dicyclomine (BENTYL) 10 MG capsule Take 10 mg by mouth 3 (three) times daily as needed for spasms.       Marland Kitchen esomeprazole (NEXIUM) 40 MG capsule Take 40 mg by mouth daily before breakfast.      . glipiZIDE (GLUCOTROL) 10 MG tablet Take 20 mg by mouth 2 (two) times daily before a meal.       . HYDROCHLOROTHIAZIDE PO Take 1 tablet by mouth daily as needed (for swelling).      Marland Kitchen HYDROcodone-acetaminophen (NORCO/VICODIN) 5-325 MG per tablet Take 2 tablets by mouth every 4 (four) hours as needed for moderate pain or severe pain.  10 tablet  0  . ibuprofen (ADVIL,MOTRIN) 200 MG tablet Take 200 mg by mouth every  6 (six) hours as needed for mild pain.      Marland Kitchen insulin NPH (HUMULIN N,NOVOLIN N) 100 UNIT/ML injection Inject 30 Units into the skin daily before breakfast.       . insulin regular (NOVOLIN R,HUMULIN R) 100 units/mL injection Inject 20 Units into the skin 3 (three) times daily before meals. Inject 20 units at breakfast, 20 units at lunchtime, and 20 units at dinner.      . levothyroxine (SYNTHROID, LEVOTHROID) 100 MCG tablet Take 100 mcg by mouth daily.      Marland Kitchen lidocaine (LIDODERM) 5 % Place 3 patches onto the skin daily. Remove & Discard patch within 12 hours or as directed by MD      . meclizine (ANTIVERT) 12.5 MG tablet Take 12.5 mg by mouth 3 (three) times daily as needed for dizziness.       . ondansetron (ZOFRAN) 4 MG tablet Take 1 tablet (4 mg total) by mouth every 8 (eight) hours as  needed for nausea or vomiting.  12 tablet  0  . polyethylene glycol (MIRALAX / GLYCOLAX) packet Take 17 g by mouth daily as needed for mild constipation.      . potassium chloride (MICRO-K) 10 MEQ CR capsule Take 10 mEq by mouth daily.      . Probiotic Product (PROBIOTIC PO) Take 1 capsule by mouth daily.       . propranolol (INDERAL) 60 MG tablet Take 60 mg by mouth daily.      . rosuvastatin (CRESTOR) 20 MG tablet Take 20 mg by mouth daily.      . Sennosides (EX-LAX PO) Take 1 tablet by mouth daily as needed (constipation).       . sucralfate (CARAFATE) 1 G tablet Take 1-2 g by mouth daily.       . TERCONAZOLE VA Place 1 application vaginally daily as needed (for yeast infections).       . traMADol-acetaminophen (ULTRACET) 37.5-325 MG per tablet Take 1-2 tablets by mouth every 6 (six) hours as needed for moderate pain. Take 1 to 2  Tablets every 6 to 8 hrs as needed for pain      . valsartan (DIOVAN) 320 MG tablet Take 320 mg by mouth daily.      . clopidogrel (PLAVIX) 75 MG tablet Take 1 tablet (75 mg total) by mouth daily.  90 tablet  3  . magnesium citrate SOLN Take 296 mLs (1 Bottle total) by mouth once.   195 mL  1  . nortriptyline (PAMELOR) 50 MG capsule Take 50 mg by mouth at bedtime.      Marland Kitchen nystatin (MYCOSTATIN) 100000 UNIT/ML suspension Take 5 mLs (500,000 Units total) by mouth 4 (four) times daily.  120 mL  0   Facility-Administered Medications Prior to Visit  Medication Dose Route Frequency Provider Last Rate Last Dose  . valproate (DEPACON) 1,000 mg in sodium chloride 0.9 % 100 mL IVPB  1,000 mg Intravenous Continuous Delia Heady, MD   1,000 mg at 08/21/12 1519    PHYSICAL EXAM Filed Vitals:   09/19/13 1454  BP: 171/82  Pulse: 83  Height: 5' 7.5" (1.715 m)  Weight: 294 lb 6.4 oz (133.539 kg)   Body mass index is 45.4 kg/(m^2).  Physical Exam  General: Morbidly Obese elderly African American lady in no distress, seated  Head: head normocephalic and atraumatic. Orohparynx benign  Neck: supple with no carotid or supraclavicular bruits  Cardiovascular: regular rate and rhythm, no murmurs  Musculoskeletal: no deformity  Skin: no rash/petichiae  Vascular: Normal pulses all extremities   Neurologic Exam  Mental Status: Awake and fully alert. Oriented to place and time. Recent and remote memory intact. Attention span, concentration and fund of knowledge appropriate. Mood and affect appropriate.  Cranial Nerves: Pupils equal, briskly reactive to light. Extraocular movements full without nystagmus. Visual fields full to confrontation. Hearing intact. Facial sensation intact. Face, tongue, palate moves normally and symmetrically.  Motor: Normal bulk and tone. Normal strength in all tested extremity muscles. Mild to moderate resting tremor of the right hand. Tremor amplifies with movement. Sensory: intact to light touch in all 4 extremities Coordination: Finger-to-nose and heel-to-shin performed accurately bilaterally.  Gait and Station: not tested; in wheelchair.  Reflexes: 1+ and symmetric.  ASSESSMENT: 73 y.o. lady with history of mixed vascular and tension headaches. Prior  history of left brain TIA in March 2014 with vascular risk factors of sleep apnea, diabetes, hypertension, hyperlipidemia, obesity. 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  PLAN:  Discontinue Propranolol for tremor and headache prevention. Start Primidone 50 mg at bedtime x 1 week, then increase to 50 mg morning and night.  After 1 week if tremor still not adequately controlled, increase to 3 times a day. Possible side effects reviewed.  Continue clopidogrel 75 mg orally every day for secondary stroke prevention and maintain strict control of hypertension with blood pressure goal below 140/90, diabetes with hemoglobin A1c goal below7 and lipids with LDL cholesterol goal below 70 mg/dL. Followup in the future with Dr. Pearlean Brownie in 6 months, sooner as needed.   Meds ordered this encounter  Medications  . clopidogrel (PLAVIX) 75 MG tablet    Sig: Take 1 tablet (75 mg total) by mouth daily.    Dispense:  90 tablet    Refill:  3    Name Brand Necessary.    Order Specific Question:  Supervising Provider    Answer:    .  primidone (MYSOLINE) 50 MG tablet    Sig: Take 1 tablet (50 mg total) by mouth 3 (three) times daily.    Dispense:  270 tablet    Refill:  1    Order Specific Question:  Supervising Provider    Answer:  Delia Heady [[id number]]   Return in about 6 months (around 03/22/2014) for stroke follow up.  Tawny Asal Castle Lamons, MSN, FNP-BC, A/GNP-C 09/20/2013, 10:59 AM [facility] [address] [place], [place] [address] [phone]  Note: This document was prepared with digital dictation and possible smart phrase technology. Any transcriptional errors that result from this process are unintentional.

## 2013-09-20 DIAGNOSIS — R251 Tremor, unspecified: Secondary | ICD-10-CM | POA: Insufficient documentation

## 2013-09-20 NOTE — Progress Notes (Signed)
I agree with the above plan 

## 2013-10-03 ENCOUNTER — Ambulatory Visit (INDEPENDENT_AMBULATORY_CARE_PROVIDER_SITE_OTHER): Payer: Medicare HMO | Admitting: Podiatrist

## 2013-10-03 ENCOUNTER — Encounter: Payer: Self-pay | Admitting: Podiatrist

## 2013-10-03 VITALS — BP 139/65 | HR 60 | Resp 12

## 2013-10-03 DIAGNOSIS — R609 Edema, unspecified: Secondary | ICD-10-CM

## 2013-10-03 NOTE — Patient Instructions (Signed)

## 2013-10-03 NOTE — Progress Notes (Signed)
   Subjective:    Patient ID: Tara Jackson, female    DOB: 1940/08/25, 73 y.o.   MRN: [id number]  HPI PT STATED LT FOOT HAVE AN INSECT BITE AND 3 WEEKS AGO. THE FOOT IS STILL HURTING AND SWOLLEN. THE FOOT GET AGGRAVATED BY PRESSURE. TRIED NO TREATMENT.   Review of Systems  Musculoskeletal: Positive for gait problem.  All other systems reviewed and are negative.      Objective:   Physical Exam  Patient is awake, alert, and oriented x 3.  In no acute distress.  Swelling is noted on bilateral feet with the left being slightly more swollen than the right.  Vascular status is intact with palpable pedal pulses at 1/4 DP and PT bilateral and capillary refill time within normal limits. Neurological sensation is also intact bilaterally via Semmes Weinstein monofilament at 2/5 sites. No open lesions present, no redness, no swelling,  No sign of insect bite on the skin.  No interdigital maceration, no open lesions present.      Assessment & Plan:  Edema, bilateral  Plan:  Unna boots applied bilateral.  Explained to the patient and her husband that it does not appear her swelling is due to a insect bite.  She will remove the unna boots in 3 days and go into her compression stockings.  If the swelling does not improve she will contact her cardiologist or primary care doctor for recommendations.

## 2013-10-09 ENCOUNTER — Ambulatory Visit (INDEPENDENT_AMBULATORY_CARE_PROVIDER_SITE_OTHER): Payer: Medicare HMO | Admitting: Podiatrist

## 2013-10-09 ENCOUNTER — Encounter: Payer: Self-pay | Admitting: Podiatrist

## 2013-10-09 VITALS — BP 157/79 | HR 64 | Resp 18 | Ht 67.5 in | Wt 286.0 lb

## 2013-10-09 DIAGNOSIS — IMO0002 Reserved for concepts with insufficient information to code with codable children: Secondary | ICD-10-CM

## 2013-10-09 DIAGNOSIS — R609 Edema, unspecified: Secondary | ICD-10-CM

## 2013-10-09 DIAGNOSIS — M779 Enthesopathy, unspecified: Secondary | ICD-10-CM

## 2013-10-09 NOTE — Patient Instructions (Signed)
Wear the stretchy socks until you are able to fit into your compression stockings.  Then wear the compression hose every day.

## 2013-10-11 NOTE — Progress Notes (Signed)
Subjective: Patient presents today still complaining of pain and swelling on bilateral lower extremity . She is convinced she has suffered a bite and thinks it may be a mosquito or fully bite on the top of the left foot. She states she removed the boots a day ago and the swelling was much better however since they have been off it has returned. She has not been wearing her compression stockings as advised.  Objective: Continued swelling is noted bilateral. There is a very small area of redness on the dorsal lateral aspect of the left foot which is not particularly look like a bite in my opinion although it could be a mosquito bite. She states it itches and is irritated. Generalized swelling noted bilateral feet and ankles and lower legs unchanged from the previous visit. Neurovascular status intact and unchanged.  Assessment: Bilateral lower extremity edema, and possible mosquito bite left foot  Plan: Discussed continuing with the compression and [place] stockings were dispensed for her to use until she can fit on her compression stockings. I also did agree to inject 2 mg of dexamethasone phosphate with 1% lidocaine plain around the area of the mosquito bite to see if this would help her discomfort and her complaints. She'll be seen back as needed if problems arise she will call.

## 2013-10-17 ENCOUNTER — Encounter (HOSPITAL_COMMUNITY): Payer: Self-pay | Admitting: *Deleted

## 2013-10-17 ENCOUNTER — Encounter (HOSPITAL_COMMUNITY): Payer: Self-pay | Admitting: Pharmacy Technician

## 2013-10-24 ENCOUNTER — Other Ambulatory Visit: Payer: Self-pay | Admitting: Gastroenterology

## 2013-10-25 NOTE — Addendum Note (Signed)
Addended byClarene Essex on: 10/25/2013 03:06 PM   Modules accepted: Orders

## 2013-10-28 NOTE — Anesthesia Preprocedure Evaluation (Addendum)
Anesthesia Evaluation  Patient identified by MRN, date of birth, ID band Patient awake    Reviewed: Allergy & Precautions, H&P , NPO status , Patient's Chart, lab work & pertinent test results  History of Anesthesia Complications (+) POST - OP SPINAL HEADACHE and history of anesthetic complications  Airway Mallampati: II TM Distance: >3 FB Neck ROM: Full    Dental  (+) Upper Dentures, Lower Dentures   Pulmonary asthma , sleep apnea ,  breath sounds clear to auscultation  Pulmonary exam normal       Cardiovascular hypertension, Pt. on medications Rhythm:Regular Rate:Normal  EF 55%   Neuro/Psych  Headaches, PSYCHIATRIC DISORDERS Anxiety Depression Symptoms of right arm weakness and unsteady gait. TIACVA, Residual Symptoms    GI/Hepatic Neg liver ROS, hiatal hernia,   Endo/Other  diabetes, Type 2, Oral Hypoglycemic AgentsHypothyroidism Morbid obesity  Renal/GU negative Renal ROS  negative genitourinary   Musculoskeletal  (+) Arthritis -,   Abdominal (+) + obese,   Peds negative pediatric ROS (+)  Hematology  (+) anemia ,   Anesthesia Other Findings   Reproductive/Obstetrics negative OB ROS                         Anesthesia Physical Anesthesia Plan  ASA: III  Anesthesia Plan: MAC   Post-op Pain Management:    Induction: Intravenous  Airway Management Planned:   Additional Equipment:   Intra-op Plan:   Post-operative Plan:   Informed Consent: I have reviewed the patients History and Physical, chart, labs and discussed the procedure including the risks, benefits and alternatives for the proposed anesthesia with the patient or authorized representative who has indicated his/her understanding and acceptance.   Dental advisory given  Plan Discussed with: CRNA  Anesthesia Plan Comments: (BMI 44, last time procedure done took 36min, heavy sedation.  Going to be cauterizing and or  debulking lesion.   Plan MAC with GA backup discussed with patient.)      Anesthesia Quick Evaluation

## 2013-11-08 ENCOUNTER — Other Ambulatory Visit: Payer: Self-pay | Admitting: Gastroenterology

## 2013-11-12 ENCOUNTER — Ambulatory Visit (HOSPITAL_COMMUNITY)
Admission: RE | Admit: 2013-11-12 | Discharge: 2013-11-12 | Disposition: A | Discharge status: Home / Self Care | Payer: Medicare HMO | Source: Ambulatory Visit | Attending: Gastroenterology | Admitting: Gastroenterology

## 2013-11-12 ENCOUNTER — Encounter (HOSPITAL_COMMUNITY): Payer: Self-pay | Admitting: *Deleted

## 2013-11-12 ENCOUNTER — Ambulatory Visit (HOSPITAL_COMMUNITY): Payer: Medicare HMO | Admitting: Anesthesiology

## 2013-11-12 ENCOUNTER — Encounter (HOSPITAL_COMMUNITY)
Admission: RE | Disposition: A | Discharge status: Home / Self Care | Payer: Self-pay | Source: Ambulatory Visit | Attending: Gastroenterology

## 2013-11-12 ENCOUNTER — Encounter (HOSPITAL_COMMUNITY): Payer: Medicare HMO | Admitting: Anesthesiology

## 2013-11-12 DIAGNOSIS — F418 Other specified anxiety disorders: Secondary | ICD-10-CM | POA: Insufficient documentation

## 2013-11-12 DIAGNOSIS — D132 Benign neoplasm of duodenum: Secondary | ICD-10-CM | POA: Diagnosis not present

## 2013-11-12 DIAGNOSIS — E119 Type 2 diabetes mellitus without complications: Secondary | ICD-10-CM | POA: Insufficient documentation

## 2013-11-12 DIAGNOSIS — I10 Essential (primary) hypertension: Secondary | ICD-10-CM | POA: Insufficient documentation

## 2013-11-12 DIAGNOSIS — D649 Anemia, unspecified: Secondary | ICD-10-CM | POA: Diagnosis not present

## 2013-11-12 DIAGNOSIS — F329 Major depressive disorder, single episode, unspecified: Secondary | ICD-10-CM | POA: Insufficient documentation

## 2013-11-12 DIAGNOSIS — J45909 Unspecified asthma, uncomplicated: Secondary | ICD-10-CM | POA: Insufficient documentation

## 2013-11-12 DIAGNOSIS — G4730 Sleep apnea, unspecified: Secondary | ICD-10-CM | POA: Diagnosis not present

## 2013-11-12 DIAGNOSIS — K2960 Other gastritis without bleeding: Secondary | ICD-10-CM | POA: Diagnosis not present

## 2013-11-12 HISTORY — PX: HOT HEMOSTASIS: SHX5433

## 2013-11-12 HISTORY — PX: ESOPHAGOGASTRODUODENOSCOPY (EGD) WITH PROPOFOL: SHX5813

## 2013-11-12 HISTORY — DX: Other reaction to spinal and lumbar puncture: G97.1

## 2013-11-12 HISTORY — DX: Spinal stenosis, site unspecified: M48.00

## 2013-11-12 HISTORY — DX: Restless legs syndrome: G25.81

## 2013-11-12 LAB — GLUCOSE, CAPILLARY: Glucose-Capillary: 240 mg/dL — ABNORMAL HIGH (ref 70–99)

## 2013-11-12 SURGERY — ESOPHAGOGASTRODUODENOSCOPY (EGD) WITH PROPOFOL
Anesthesia: Monitor Anesthesia Care

## 2013-11-12 MED ORDER — SODIUM CHLORIDE 0.9 % IV SOLN: INTRAVENOUS | Status: DC

## 2013-11-12 MED ORDER — PROPOFOL 10 MG/ML IV BOLUS
INTRAVENOUS | Status: DC | PRN
  Administered 2013-11-12: 20 mg via INTRAVENOUS
  Administered 2013-11-12: 10 mg via INTRAVENOUS
  Administered 2013-11-12: 40 mg via INTRAVENOUS
  Administered 2013-11-12: 30 mg via INTRAVENOUS

## 2013-11-12 MED ORDER — PROPOFOL 10 MG/ML IV BOLUS
INTRAVENOUS | Status: AC
  Filled 2013-11-12: qty 20

## 2013-11-12 MED ORDER — LACTATED RINGERS IV SOLN
INTRAVENOUS | Status: DC
Start: 2013-11-12 — End: 2013-11-12
  Administered 2013-11-12: 1000 mL via INTRAVENOUS

## 2013-11-12 MED ORDER — PROPOFOL INFUSION 10 MG/ML OPTIME
INTRAVENOUS | Status: DC | PRN
  Administered 2013-11-12: 100 ug/kg/min via INTRAVENOUS

## 2013-11-12 SURGICAL SUPPLY — 15 items

## 2013-11-12 NOTE — Progress Notes (Signed)
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

## 2013-11-12 NOTE — Op Note (Signed)
[facility] [place], [address]   ENDOSCOPY PROCEDURE REPORT  PATIENT: Tara, Jackson  MR#: [id number] BIRTHDATE: [birth year] , 33  yrs. old GENDER: female ENDOSCOPIST: Clarene Essex, MD REFERRED BY:  Shon Baton, M.D. 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 MEDICATIONS: Propofol 260 mg IV TOPICAL ANESTHETIC: none  DESCRIPTION OF PROCEDURE: After the risks benefits and alternatives of the procedure were thoroughly explained, informed consent was obtained.  The Pentax Gastroscope [id number] endoscope was introduced through the mouth and advanced to the second portion of the duodenum , Without limitations.  The instrument was slowly withdrawn as the mucosa was fully examined.    the findings are recorded       Retroflexed views revealed no abnormalities.     The scope was then withdrawn from the patient and the procedure completed.  COMPLICATIONS: There were no immediate complications.  ENDOSCOPIC IMPRESSION: 1 mild gastritisand antritis 2. Small second portion of the duodenal polyp status post hot snare and removed 3 otherwise within normal limits EGD  RECOMMENDATIONS: await pathology call when necessary followup when necessary or 6 months  REPEAT EXAM: as needed based on pathology and other medical problems  eSigned:  Clarene Essex, MD 11/12/2013 12:37 PM    [id number] Virgina Jock, MD  CPT CODES: ICD CODES:  The ICD and CPT codes recommended by this software are interpretations from the data that the clinical staff has captured with the software.  The verification of the translation of this report to the ICD and CPT codes and modifiers is the sole responsibility of the health care institution and practicing physician where this report was generated.  [employer] will not be held  responsible for the validity of the ICD and CPT codes included on this report.  AMA assumes no liability for data contained or not contained herein. CPT is a Designer, television/film set of the [employer].  PATIENT NAME:  Tara, Jackson MR#: [id number]

## 2013-11-12 NOTE — Transfer of Care (Signed)
Immediate Anesthesia Transfer of Care Note  Patient: Samauri Rockenbach  Procedure(s) Performed: Procedure(s): ESOPHAGOGASTRODUODENOSCOPY (EGD) WITH PROPOFOL (N/A) HOT HEMOSTASIS (ARGON PLASMA COAGULATION/BICAP) (N/A)  Patient Location: PACU  Anesthesia Type:MAC  Level of Consciousness: awake, alert  and oriented  Airway & Oxygen Therapy: Patient Spontanous Breathing and Patient connected to nasal cannula oxygen  Post-op Assessment: Report given to PACU RN and Post -op Vital signs reviewed and stable  Post vital signs: Reviewed and stable  Complications: No apparent anesthesia complications

## 2013-11-12 NOTE — Anesthesia Postprocedure Evaluation (Signed)
  Anesthesia Post-op Note  Patient: Tara Jackson  Procedure(s) Performed: Procedure(s) (LRB): ESOPHAGOGASTRODUODENOSCOPY (EGD) WITH PROPOFOL (N/A) HOT HEMOSTASIS (ARGON PLASMA COAGULATION/BICAP) (N/A)  Patient Location: PACU  Anesthesia Type: MAC  Level of Consciousness: awake and alert   Airway and Oxygen Therapy: Patient Spontanous Breathing  Post-op Pain: mild  Post-op Assessment: Post-op Vital signs reviewed, Patient's Cardiovascular Status Stable, Respiratory Function Stable, Patent Airway and No signs of Nausea or vomiting  Last Vitals:  Filed Vitals:   11/12/13 1240  BP: 180/55  Pulse: 76  Temp:   Resp: 16    Post-op Vital Signs: stable   Complications: No apparent anesthesia complications

## 2013-11-12 NOTE — Discharge Instructions (Addendum)
Esophagogastroduodenoscopy Care After Refer to this sheet in the next few weeks. These instructions provide you with information on caring for yourself after your procedure. Your caregiver may also give you more specific instructions. Your treatment has been planned according to current medical practices, but problems sometimes occur. Call your caregiver if you have any problems or questions after your procedure.  HOME CARE INSTRUCTIONS  Do not eat or drink anything until the numbing medicine (local anesthetic) has worn off and your gag reflex has returned. You will know that the local anesthetic has worn off when you can swallow comfortably.  Do not drive for 12 hours after the procedure or as directed by your caregiver.  Only take medicines as directed by your caregiver. SEEK MEDICAL CARE IF:   You cannot stop coughing.  You are not urinating at all or less than usual. SEEK IMMEDIATE MEDICAL CARE IF:  You have difficulty swallowing.  You cannot eat or drink.  You have worsening throat or chest pain.  You have dizziness, lightheadedness, or you faint.  You have nausea or vomiting.  You have chills.  You have a fever.  You have severe abdominal pain.  You have black, tarry, or bloody stools. Document Released: 01/11/2012 Document Reviewed: 01/11/2012 [facility] Patient Information 2015 [place]. This information is not intended to replace advice given to you by your health care provider. Make sure you discuss any questions you have with your health care provider.   Monitored Anesthesia Care Monitored anesthesia care is an anesthesia service for a medical procedure. Anesthesia is the loss of the ability to feel pain. It is produced by medicines called anesthetics. It may affect a small area of your body (local anesthesia), a large area of your body (regional anesthesia), or your entire body (general anesthesia). The need for monitored anesthesia care depends your procedure,  your condition, and the potential need for regional or general anesthesia. It is often provided during procedures where:   General anesthesia may be needed if there are complications. This is because you need special care when you are under general anesthesia.   You will be under local or regional anesthesia. This is so that you are able to have higher levels of anesthesia if needed.   You will receive calming medicines (sedatives). This is especially the case if sedatives are given to put you in a semi-conscious state of relaxation (deep sedation). This is because the amount of sedative needed to produce this state can be hard to predict. Too much of a sedative can produce general anesthesia. Monitored anesthesia care is performed by one or more health care providers who have special training in all types of anesthesia. You will need to meet with these health care providers before your procedure. During this meeting, they will ask you about your medical history. They will also give you instructions to follow. (For example, you will need to stop eating and drinking before your procedure. You may also need to stop or change medicines you are taking.) During your procedure, your health care providers will stay with you. They will:   Watch your condition. This includes watching your blood pressure, breathing, and level of pain.   Diagnose and treat problems that occur.   Give medicines if they are needed. These may include calming medicines (sedatives) and anesthetics.   Make sure you are comfortable.  Having monitored anesthesia care does not necessarily mean that you will be under anesthesia. It does mean that your health care providers  will be able to manage anesthesia if you need it or if it occurs. It also means that you will be able to have a different type of anesthesia than you are having if you need it. When your procedure is complete, your health care providers will continue to watch  your condition. They will make sure any medicines wear off before you are allowed to go home.  Document Released: 10/20/2004 Document Revised: 06/10/2013 Document Reviewed: 03/07/2012 [facility] Patient Information 2015 [place], [place]. This information is not intended to replace advice given to you by your health care provider. Make sure you discuss any questions you have with your health care provider. Call if question or problem otherwise for biopsy report in one week and followup as needed or in 3-6 months and resume Plavix on Friday unless signs of bleeding or problem

## 2013-11-13 ENCOUNTER — Encounter (HOSPITAL_COMMUNITY): Payer: Self-pay | Admitting: Gastroenterology

## 2014-01-16 ENCOUNTER — Encounter (HOSPITAL_COMMUNITY): Payer: Self-pay | Admitting: Cardiology

## 2014-02-05 ENCOUNTER — Other Ambulatory Visit: Payer: Self-pay | Admitting: Cardiology

## 2014-02-05 NOTE — Telephone Encounter (Signed)
Nystatin PO refill refused - defer to PCP (was not on patient med list)

## 2014-04-18 ENCOUNTER — Encounter (HOSPITAL_COMMUNITY): Payer: Self-pay | Admitting: *Deleted

## 2014-04-18 ENCOUNTER — Observation Stay (HOSPITAL_COMMUNITY)
Admission: EM | Admit: 2014-04-18 | Discharge: 2014-04-21 | Disposition: A | Discharge status: Home / Self Care | Payer: Medicare HMO | Attending: Internal Medicine | Admitting: Internal Medicine

## 2014-04-18 DIAGNOSIS — E1140 Type 2 diabetes mellitus with diabetic neuropathy, unspecified: Secondary | ICD-10-CM | POA: Insufficient documentation

## 2014-04-18 DIAGNOSIS — H409 Unspecified glaucoma: Secondary | ICD-10-CM | POA: Diagnosis not present

## 2014-04-18 DIAGNOSIS — R0789 Other chest pain: Principal | ICD-10-CM | POA: Insufficient documentation

## 2014-04-18 DIAGNOSIS — Z794 Long term (current) use of insulin: Secondary | ICD-10-CM | POA: Insufficient documentation

## 2014-04-18 DIAGNOSIS — Z9842 Cataract extraction status, left eye: Secondary | ICD-10-CM | POA: Diagnosis not present

## 2014-04-18 DIAGNOSIS — J45909 Unspecified asthma, uncomplicated: Secondary | ICD-10-CM | POA: Diagnosis present

## 2014-04-18 DIAGNOSIS — R079 Chest pain, unspecified: Secondary | ICD-10-CM | POA: Diagnosis present

## 2014-04-18 DIAGNOSIS — R001 Bradycardia, unspecified: Secondary | ICD-10-CM | POA: Insufficient documentation

## 2014-04-18 DIAGNOSIS — Z86711 Personal history of pulmonary embolism: Secondary | ICD-10-CM | POA: Diagnosis not present

## 2014-04-18 DIAGNOSIS — E1165 Type 2 diabetes mellitus with hyperglycemia: Secondary | ICD-10-CM

## 2014-04-18 DIAGNOSIS — E785 Hyperlipidemia, unspecified: Secondary | ICD-10-CM | POA: Diagnosis not present

## 2014-04-18 DIAGNOSIS — R600 Localized edema: Secondary | ICD-10-CM | POA: Diagnosis present

## 2014-04-18 DIAGNOSIS — R609 Edema, unspecified: Secondary | ICD-10-CM | POA: Insufficient documentation

## 2014-04-18 DIAGNOSIS — J45901 Unspecified asthma with (acute) exacerbation: Secondary | ICD-10-CM | POA: Diagnosis not present

## 2014-04-18 DIAGNOSIS — I878 Other specified disorders of veins: Secondary | ICD-10-CM | POA: Diagnosis present

## 2014-04-18 DIAGNOSIS — R0600 Dyspnea, unspecified: Secondary | ICD-10-CM

## 2014-04-18 DIAGNOSIS — M1990 Unspecified osteoarthritis, unspecified site: Secondary | ICD-10-CM | POA: Insufficient documentation

## 2014-04-18 DIAGNOSIS — Z86718 Personal history of other venous thrombosis and embolism: Secondary | ICD-10-CM | POA: Diagnosis not present

## 2014-04-18 DIAGNOSIS — R0609 Other forms of dyspnea: Secondary | ICD-10-CM | POA: Insufficient documentation

## 2014-04-18 DIAGNOSIS — Z79899 Other long term (current) drug therapy: Secondary | ICD-10-CM | POA: Diagnosis not present

## 2014-04-18 DIAGNOSIS — IMO0002 Reserved for concepts with insufficient information to code with codable children: Secondary | ICD-10-CM

## 2014-04-18 DIAGNOSIS — G4730 Sleep apnea, unspecified: Secondary | ICD-10-CM | POA: Insufficient documentation

## 2014-04-18 DIAGNOSIS — E039 Hypothyroidism, unspecified: Secondary | ICD-10-CM | POA: Insufficient documentation

## 2014-04-18 DIAGNOSIS — D649 Anemia, unspecified: Secondary | ICD-10-CM | POA: Insufficient documentation

## 2014-04-18 DIAGNOSIS — F329 Major depressive disorder, single episode, unspecified: Secondary | ICD-10-CM | POA: Insufficient documentation

## 2014-04-18 DIAGNOSIS — I519 Heart disease, unspecified: Secondary | ICD-10-CM | POA: Diagnosis not present

## 2014-04-18 DIAGNOSIS — G971 Other reaction to spinal and lumbar puncture: Secondary | ICD-10-CM | POA: Insufficient documentation

## 2014-04-18 DIAGNOSIS — G2581 Restless legs syndrome: Secondary | ICD-10-CM | POA: Diagnosis not present

## 2014-04-18 DIAGNOSIS — Z7902 Long term (current) use of antithrombotics/antiplatelets: Secondary | ICD-10-CM | POA: Insufficient documentation

## 2014-04-18 DIAGNOSIS — M7989 Other specified soft tissue disorders: Secondary | ICD-10-CM | POA: Diagnosis not present

## 2014-04-18 DIAGNOSIS — Z9841 Cataract extraction status, right eye: Secondary | ICD-10-CM | POA: Insufficient documentation

## 2014-04-18 DIAGNOSIS — M4800 Spinal stenosis, site unspecified: Secondary | ICD-10-CM | POA: Diagnosis not present

## 2014-04-18 DIAGNOSIS — E119 Type 2 diabetes mellitus without complications: Secondary | ICD-10-CM

## 2014-04-18 DIAGNOSIS — I10 Essential (primary) hypertension: Secondary | ICD-10-CM | POA: Diagnosis not present

## 2014-04-18 DIAGNOSIS — N179 Acute kidney failure, unspecified: Secondary | ICD-10-CM | POA: Insufficient documentation

## 2014-04-18 DIAGNOSIS — F419 Anxiety disorder, unspecified: Secondary | ICD-10-CM | POA: Insufficient documentation

## 2014-04-18 DIAGNOSIS — R0781 Pleurodynia: Secondary | ICD-10-CM | POA: Diagnosis present

## 2014-04-18 DIAGNOSIS — Z8673 Personal history of transient ischemic attack (TIA), and cerebral infarction without residual deficits: Secondary | ICD-10-CM | POA: Diagnosis not present

## 2014-04-18 LAB — COMPREHENSIVE METABOLIC PANEL
ALT: 10 U/L (ref 0–35)
AST: 16 U/L (ref 0–37)
Albumin: 3.3 g/dL — ABNORMAL LOW (ref 3.5–5.2)
Alkaline Phosphatase: 81 U/L (ref 39–117)
Anion gap: 7 (ref 5–15)
BUN: 20 mg/dL (ref 6–23)
CO2: 28 mmol/L (ref 19–32)
Calcium: 9.3 mg/dL (ref 8.4–10.5)
Chloride: 105 mmol/L (ref 96–112)
Creatinine, Ser: 1.53 mg/dL — ABNORMAL HIGH (ref 0.50–1.10)
GFR calc Af Amer: 37 mL/min — ABNORMAL LOW (ref >90)
GFR calc non Af Amer: 32 mL/min — ABNORMAL LOW (ref >90)
Glucose, Bld: 230 mg/dL — ABNORMAL HIGH (ref 70–99)
Potassium: 4.7 mmol/L (ref 3.5–5.1)
Sodium: 140 mmol/L (ref 135–145)
Total Bilirubin: 0.5 mg/dL (ref 0.3–1.2)
Total Protein: 6.6 g/dL (ref 6.0–8.3)

## 2014-04-18 LAB — CBC WITH DIFFERENTIAL/PLATELET
Basophils Absolute: 0 10*3/uL (ref 0.0–0.1)
Basophils Relative: 1 % (ref 0–1)
Eosinophils Absolute: 0.2 10*3/uL (ref 0.0–0.7)
Eosinophils Relative: 3 % (ref 0–5)
HCT: 33.2 % — ABNORMAL LOW (ref 36.0–46.0)
Hemoglobin: 10.8 g/dL — ABNORMAL LOW (ref 12.0–15.0)
Lymphocytes Relative: 33 % (ref 12–46)
Lymphs Abs: 2 10*3/uL (ref 0.7–4.0)
MCH: 30.9 pg (ref 26.0–34.0)
MCHC: 32.5 g/dL (ref 30.0–36.0)
MCV: 94.9 fL (ref 78.0–100.0)
Monocytes Absolute: 0.3 10*3/uL (ref 0.1–1.0)
Monocytes Relative: 5 % (ref 3–12)
Neutro Abs: 3.6 10*3/uL (ref 1.7–7.7)
Neutrophils Relative %: 58 % (ref 43–77)
Platelets: 238 10*3/uL (ref 150–400)
RBC: 3.5 MIL/uL — ABNORMAL LOW (ref 3.87–5.11)
RDW: 13 % (ref 11.5–15.5)
WBC: 6.2 10*3/uL (ref 4.0–10.5)

## 2014-04-18 NOTE — ED Provider Notes (Addendum)
CSN: [id number]     Arrival date & time 04/18/14  1956 History   First MD Initiated Contact with Patient 04/18/14 2307     Chief Complaint  Patient presents with  . Leg Pain     (Consider location/radiation/quality/duration/timing/severity/associated sxs/prior Treatment) HPI Patient complains of right-sided posterior chest pain sudden onset yesterday 4 PM accompanied by shortness of breath. Shortness of breath has improved since onset. Pain is minimal presently. She also complains of swelling and left legs for the past 3 or 4 days. Patient is concerned about pulmonary embolism as she's had several in the past for which she takes Plavix. No fever. No cough. No other associated symptoms. No treatment prior to coming here. Past Medical History  Diagnosis Date  . DVT (deep venous thrombosis)   . Asthma   . Hypothyroidism   . Anxiety   . Depression   . H/O hiatal hernia   . Arthritis   . Anemia   . PE (pulmonary thromboembolism) x 3, last 14 years ago    history of recurrent RLE DVT with PE - last PE ~>13 yrs; Maintained on Plavix  . Hyperlipidemia   . Diabetes mellitus type 2, insulin dependent   . Hypertension associated with diabetes   . Glaucoma   . Cataract   . Diabetic neuropathy   . Palpitations 35years ago    Cardionet monitor - revealed mostly normal sinus rhythm, sinus bradycardia with first-degree A-V block heart rates mostly in the 50s and 60s with some 70s. No arrhythmias, PVCs or PACs noted.  Marland Kitchen TIA (transient ischemic attack) March 2014  . Diastolic dysfunction, left ventricle     mild LVH, EF 55% by echo; no ischemia by stress 01/2010 echo [facility])  . Stroke 04-19-2013    tia and stroke. Weakness Rt hand  . Sleep apnea     cpap disontinued; test done 07/14/2008 ordered per  Byrum  . Sleep apnea   . Restless legs syndrome   . Spinal stenosis     L 3, L 4 and L 5, and C 1, C 2 and C3  . Complication of anesthesia     hard to wake up after anesthesia, trouble turning  head  . Spinal headache    Past Surgical History  Procedure Laterality Date  . Abdominal hysterectomy    . Cholecystectomy    . Back surgery      steroid inj  . Tonsillectomy    . Appendectomy    . Esophagogastroduodenoscopy  08/09/2011    Procedure: ESOPHAGOGASTRODUODENOSCOPY (EGD);  Surgeon: Jeryl Columbia, MD;  Location: [facility];  Service: Endoscopy;  Laterality: N/A;  . Hot hemostasis  08/09/2011    Procedure: HOT HEMOSTASIS (ARGON PLASMA COAGULATION/BICAP);  Surgeon: Jeryl Columbia, MD;  Location: [facility];  Service: Endoscopy;  Laterality: N/A;  . Trigger finger release  11/22/2011    Procedure: RELEASE TRIGGER FINGER/A-1 PULLEY;  Surgeon: Meredith Pel, MD;  Location: [facility];  Service: Orthopedics;  Laterality: Left;  Left trigger thumb release  . Cardiac catheterization  02/14/2012    no evidence of obstructive coronary disease ,low EDP with normal EF  . Doppler echocardiography  04/19/2012    EF 0000000; normal diastolic pressures, no regional wall motion motion abnormalities  . Nm myocar perf wall motion  01/26/2012    EF 79%,LV normal,ST SEGMENT CHANGE SUGGESTIVE OF ISCHEMIA.  Eusebio Me doppler  05/29/2012    RIGHT EXTREM. NORMAL VENOUS DUPLEX  . Joint replacement  right knee x 1, left knee x 2  . Left knee spacer bar placement  done with 2nd left knee replacement  . Eye surgery Bilateral 4-5-yrs ago  . Right trigger finger release  yrs ago  . Esophagogastroduodenoscopy (egd) with propofol N/A 11/12/2013    Procedure: ESOPHAGOGASTRODUODENOSCOPY (EGD) WITH PROPOFOL;  Surgeon: Jeryl Columbia, MD;  Location: [facility];  Service: Endoscopy;  Laterality: N/A;  . Hot hemostasis N/A 11/12/2013    Procedure: HOT HEMOSTASIS (ARGON PLASMA COAGULATION/BICAP);  Surgeon: Jeryl Columbia, MD;  Location: [facility];  Service: Endoscopy;  Laterality: N/A;  . Left heart catheterization with coronary angiogram N/A 02/14/2012    Procedure: LEFT HEART CATHETERIZATION WITH CORONARY  ANGIOGRAM;  Surgeon: Leonie Man, MD;  Location: [facility] CATH LAB;  Service: Cardiovascular;  Laterality: N/A;   Family History  Problem Relation Age of Onset  . Cancer Mother   . Cancer - Prostate Father    History  Substance Use Topics  . Smoking status: Never Smoker   . Smokeless tobacco: Never Used  . Alcohol Use: No   OB History    No data available     Review of Systems  Constitutional: Negative.   HENT: Negative.   Respiratory: Positive for shortness of breath.   Cardiovascular: Positive for chest pain and leg swelling.  Gastrointestinal: Negative.   Musculoskeletal: Positive for gait problem.       Walks with walker uses scooter  Skin: Negative.   Neurological: Negative.   Psychiatric/Behavioral: Negative.   All other systems reviewed and are negative.     Allergies  Iodine; Metoclopramide hcl; Sulfa antibiotics; Iohexol; Lactose intolerance (gi); and Lansoprazole  Home Medications   Prior to Admission medications   Medication Sig Start Date End Date Taking? Authorizing Provider  albuterol (PROVENTIL HFA;VENTOLIN HFA) 108 (90 BASE) MCG/ACT inhaler Inhale 2 puffs into the lungs every 6 (six) hours as needed for wheezing.    Historical Provider, MD  busPIRone (BUSPAR) 7.5 MG tablet Take 7.5 mg by mouth every morning.     Historical Provider, MD  Cholecalciferol (VITAMIN D-3 PO) Take 1 tablet by mouth every morning.     Historical Provider, MD  clonazePAM (KLONOPIN) 0.5 MG tablet Take 0.5-1 mg by mouth 2 (two) times daily. 1 mg (2 tablets) in the morning and 0.5 mg (1 tablet) at night    Historical Provider, MD  clopidogrel (PLAVIX) 75 MG tablet Take 75 mg by mouth every morning.    Historical Provider, MD  dicyclomine (BENTYL) 10 MG capsule Take 10 mg by mouth 3 (three) times daily as needed for spasms.     Historical Provider, MD  esomeprazole (NEXIUM) 40 MG capsule Take 40 mg by mouth daily before breakfast.    Historical Provider, MD  glipiZIDE (GLUCOTROL) 10 MG  tablet Take 20 mg by mouth 2 (two) times daily before a meal.     Historical Provider, MD  hydrochlorothiazide (MICROZIDE) 12.5 MG capsule Take 12.5 mg by mouth daily as needed (swelling.).    Historical Provider, MD  HYDROcodone-acetaminophen (NORCO/VICODIN) 5-325 MG per tablet Take 2 tablets by mouth every 4 (four) hours as needed for moderate pain or severe pain. 08/05/13   Clayton Bibles, PA-C  ibuprofen (ADVIL,MOTRIN) 200 MG tablet Take 200 mg by mouth every 6 (six) hours as needed for mild pain.    Historical Provider, MD  insulin NPH (HUMULIN N,NOVOLIN N) 100 UNIT/ML injection Inject 30 Units into the skin daily before breakfast. Novolin-N  Historical Provider, MD  insulin regular (NOVOLIN R,HUMULIN R) 100 units/mL injection Inject 20 Units into the skin 3 (three) times daily before meals. Inject 20 units at breakfast, 20 units at lunchtime, and 20 units at dinner.    Historical Provider, MD  levothyroxine (SYNTHROID, LEVOTHROID) 100 MCG tablet Take 100 mcg by mouth every morning.     Historical Provider, MD  lidocaine (LIDODERM) 5 % Place 3 patches onto the skin daily. Remove & Discard patch within 12 hours or as directed by MD    Historical Provider, MD  Linaclotide (LINZESS) 290 MCG CAPS capsule Take 290 mcg by mouth every morning.     Historical Provider, MD  magnesium citrate SOLN Take 1 Bottle by mouth once as needed for mild constipation or severe constipation.    Historical Provider, MD  meclizine (ANTIVERT) 12.5 MG tablet Take 12.5 mg by mouth 3 (three) times daily as needed for dizziness.     Historical Provider, MD  methscopolamine (PAMINE FORTE) 5 MG tablet Take 5 mg by mouth 2 (two) times daily as needed (vertigo).     Historical Provider, MD  ondansetron (ZOFRAN) 4 MG tablet Take 1 tablet (4 mg total) by mouth every 8 (eight) hours as needed for nausea or vomiting. 08/05/13   Clayton Bibles, PA-C  polyethylene glycol (MIRALAX / GLYCOLAX) packet Take 17 g by mouth daily as needed for mild  constipation.    Historical Provider, MD  potassium chloride (MICRO-K) 10 MEQ CR capsule Take 10 mEq by mouth daily as needed (leg cramps).     Historical Provider, MD  primidone (MYSOLINE) 50 MG tablet Take 50 mg by mouth 3 (three) times daily.    Historical Provider, MD  Probiotic Product (PROBIOTIC PO) Take 1 capsule by mouth daily. Align.    Historical Provider, MD  propranolol (INDERAL) 60 MG tablet Take 60 mg by mouth every morning.     Historical Provider, MD  rosuvastatin (CRESTOR) 20 MG tablet Take 20 mg by mouth every morning.     Historical Provider, MD  Sennosides (EX-LAX PO) Take 1 tablet by mouth daily as needed (constipation).     Historical Provider, MD  Simethicone (PHAZYME) 180 MG CAPS Take 180 mg by mouth once as needed.     Historical Provider, MD  sucralfate (CARAFATE) 1 G tablet Take 1-2 g by mouth daily.     Historical Provider, MD  TERCONAZOLE VA Place 1 application vaginally daily as needed (for yeast infections).     Historical Provider, MD  traMADol-acetaminophen (ULTRACET) 37.5-325 MG per tablet Take 1-2 tablets by mouth every 6 (six) hours as needed for moderate pain. Take 1 to 2  Tablets every 6 to 8 hrs as needed for pain    Historical Provider, MD  valsartan (DIOVAN) 320 MG tablet Take 320 mg by mouth every morning. Brand Name: Diovan only.    Historical Provider, MD   BP 145/46 mmHg  Pulse 41  Temp(Src) 98.6 F (37 C) (Oral)  Resp 17  Ht 5' 7.5" (1.715 m)  Wt 298 lb (135.172 kg)  BMI 45.96 kg/m2  SpO2 99% Physical Exam  Constitutional: She appears well-developed and well-nourished.  HENT:  Head: Normocephalic and atraumatic.  Eyes: Conjunctivae are normal. Pupils are equal, round, and reactive to light.  Neck: Neck supple. No tracheal deviation present. No thyromegaly present.  Cardiovascular: Regular rhythm.   No murmur heard. Mildly bradycardic  Pulmonary/Chest: Effort normal and breath sounds normal.  Abdominal: Soft. Bowel sounds are normal. She  exhibits no distension. There is no tenderness.  Obese  Musculoskeletal: Normal range of motion. She exhibits edema. She exhibits no tenderness.  1+ pretibial pitting edema bilaterally  Neurological: She is alert. Coordination normal.  Skin: Skin is warm and dry. No rash noted.  Psychiatric: She has a normal mood and affect.  Nursing note and vitals reviewed.   ED Course  Procedures (including critical care time) Labs Review Labs Reviewed  CBC WITH DIFFERENTIAL/PLATELET - Abnormal; Notable for the following:    RBC 3.50 (*)    Hemoglobin 10.8 (*)    HCT 33.2 (*)    All other components within normal limits  COMPREHENSIVE METABOLIC PANEL - Abnormal; Notable for the following:    Glucose, Bld 230 (*)    Creatinine, Ser 1.53 (*)    Albumin 3.3 (*)    GFR calc non Af Amer 32 (*)    GFR calc Af Amer 37 (*)    All other components within normal limits  URINALYSIS, ROUTINE W REFLEX MICROSCOPIC    Imaging Review No results found.   EKG Interpretation   Date/Time:  Friday April 18 2014 23:58:08 EST Ventricular Rate:  43 PR Interval:  247 QRS Duration: 99 QT Interval:  515 QTC Calculation: 435 R Axis:   56 Text Interpretation:  Sinus bradycardia Prolonged PR interval Low voltage,  precordial leads Nonspecific T abnormalities, lateral leads Since last  tracing rate slower Confirmed by Winfred Leeds  MD, Annaliyah Willig [phone]) on 04/19/2014  12:22:00 AM     Chest x-ray viewed by me Results for orders placed or performed during the hospital encounter of 04/18/14  CBC with Differential  Result Value Ref Range   WBC 6.2 4.0 - 10.5 K/uL   RBC 3.50 (L) 3.87 - 5.11 MIL/uL   Hemoglobin 10.8 (L) 12.0 - 15.0 g/dL   HCT 33.2 (L) 36.0 - 46.0 %   MCV 94.9 78.0 - 100.0 fL   MCH 30.9 26.0 - 34.0 pg   MCHC 32.5 30.0 - 36.0 g/dL   RDW 13.0 11.5 - 15.5 %   Platelets 238 150 - 400 K/uL   Neutrophils Relative % 58 43 - 77 %   Neutro Abs 3.6 1.7 - 7.7 K/uL   Lymphocytes Relative 33 12 - 46 %    Lymphs Abs 2.0 0.7 - 4.0 K/uL   Monocytes Relative 5 3 - 12 %   Monocytes Absolute 0.3 0.1 - 1.0 K/uL   Eosinophils Relative 3 0 - 5 %   Eosinophils Absolute 0.2 0.0 - 0.7 K/uL   Basophils Relative 1 0 - 1 %   Basophils Absolute 0.0 0.0 - 0.1 K/uL  Comprehensive metabolic panel  Result Value Ref Range   Sodium 140 135 - 145 mmol/L   Potassium 4.7 3.5 - 5.1 mmol/L   Chloride 105 96 - 112 mmol/L   CO2 28 19 - 32 mmol/L   Glucose, Bld 230 (H) 70 - 99 mg/dL   BUN 20 6 - 23 mg/dL   Creatinine, Ser 1.53 (H) 0.50 - 1.10 mg/dL   Calcium 9.3 8.4 - 10.5 mg/dL   Total Protein 6.6 6.0 - 8.3 g/dL   Albumin 3.3 (L) 3.5 - 5.2 g/dL   AST 16 0 - 37 U/L   ALT 10 0 - 35 U/L   Alkaline Phosphatase 81 39 - 117 U/L   Total Bilirubin 0.5 0.3 - 1.2 mg/dL   GFR calc non Af Amer 32 (L) >90 mL/min   GFR calc Af Amer 37 (  L) >90 mL/min   Anion gap 7 5 - 15  Brain natriuretic peptide  Result Value Ref Range   B Natriuretic Peptide 120.6 (H) 0.0 - 100.0 pg/mL  I-stat troponin, ED  Result Value Ref Range   Troponin i, poc 0.01 0.00 - 0.08 ng/mL   Comment 3           No results found.  MDM  Patient is not a candidate for CT angiogram due to low GFR and reports cardiac arrest as result of intravenous contrast dye administered. sPoke with Dr. Arnoldo Morale plan 23 hour observation telemetry, Lovenox Diagnosis #1 chest pain #2 peripheral edema #3hyperglycemia #4 renal insufficiency #5 anemia Final diagnoses:  None        Orlie Dakin, MD 04/19/14 [id number]  Orlie Dakin, MD 04/19/14 [id number]

## 2014-04-18 NOTE — ED Notes (Signed)
Pt states she is leaving to go home to lay down.  States she is tired of waiting.  Explained to pt that she should go to a room soon because there are several discharges and ready beds.  Encouraged her to wait.  Pt states she will wait for a little bit longer.

## 2014-04-18 NOTE — ED Notes (Signed)
The pt is having a strange feeling in both her legs since yesterday.  She started having rt flank pain today.  She has some swelling also.  She has had blood clots in the opast.

## 2014-04-19 ENCOUNTER — Emergency Department (HOSPITAL_COMMUNITY): Payer: Medicare HMO

## 2014-04-19 ENCOUNTER — Observation Stay (HOSPITAL_COMMUNITY): Payer: Medicare HMO

## 2014-04-19 DIAGNOSIS — E118 Type 2 diabetes mellitus with unspecified complications: Secondary | ICD-10-CM

## 2014-04-19 DIAGNOSIS — R600 Localized edema: Secondary | ICD-10-CM

## 2014-04-19 DIAGNOSIS — E119 Type 2 diabetes mellitus without complications: Secondary | ICD-10-CM

## 2014-04-19 DIAGNOSIS — R609 Edema, unspecified: Secondary | ICD-10-CM

## 2014-04-19 DIAGNOSIS — R079 Chest pain, unspecified: Secondary | ICD-10-CM | POA: Diagnosis present

## 2014-04-19 DIAGNOSIS — E785 Hyperlipidemia, unspecified: Secondary | ICD-10-CM

## 2014-04-19 DIAGNOSIS — R0609 Other forms of dyspnea: Secondary | ICD-10-CM | POA: Insufficient documentation

## 2014-04-19 DIAGNOSIS — I10 Essential (primary) hypertension: Secondary | ICD-10-CM

## 2014-04-19 DIAGNOSIS — R0781 Pleurodynia: Secondary | ICD-10-CM

## 2014-04-19 DIAGNOSIS — R0602 Shortness of breath: Secondary | ICD-10-CM

## 2014-04-19 DIAGNOSIS — J4520 Mild intermittent asthma, uncomplicated: Secondary | ICD-10-CM

## 2014-04-19 DIAGNOSIS — I878 Other specified disorders of veins: Secondary | ICD-10-CM | POA: Diagnosis present

## 2014-04-19 DIAGNOSIS — I519 Heart disease, unspecified: Secondary | ICD-10-CM

## 2014-04-19 DIAGNOSIS — R0600 Dyspnea, unspecified: Secondary | ICD-10-CM

## 2014-04-19 LAB — TROPONIN I
Troponin I: <0.03 ng/mL (ref <0.031)
Troponin I: <0.03 ng/mL (ref <0.031)
Troponin I: <0.03 ng/mL (ref <0.031)

## 2014-04-19 LAB — URINALYSIS, ROUTINE W REFLEX MICROSCOPIC
Bilirubin Urine: NEGATIVE
Glucose, UA: NEGATIVE mg/dL
Hgb urine dipstick: NEGATIVE
Ketones, ur: NEGATIVE mg/dL
Leukocytes, UA: NEGATIVE
Nitrite: NEGATIVE
Protein, ur: NEGATIVE mg/dL
Specific Gravity, Urine: 1.015 (ref 1.005–1.030)
Urobilinogen, UA: 0.2 mg/dL (ref 0.0–1.0)
pH: 5 (ref 5.0–8.0)

## 2014-04-19 LAB — BASIC METABOLIC PANEL
Anion gap: 9 (ref 5–15)
BUN: 20 mg/dL (ref 6–23)
CO2: 27 mmol/L (ref 19–32)
Calcium: 9.1 mg/dL (ref 8.4–10.5)
Chloride: 106 mmol/L (ref 96–112)
Creatinine, Ser: 1.44 mg/dL — ABNORMAL HIGH (ref 0.50–1.10)
GFR calc Af Amer: 40 mL/min — ABNORMAL LOW (ref >90)
GFR calc non Af Amer: 35 mL/min — ABNORMAL LOW (ref >90)
Glucose, Bld: 164 mg/dL — ABNORMAL HIGH (ref 70–99)
Potassium: 4.3 mmol/L (ref 3.5–5.1)
Sodium: 142 mmol/L (ref 135–145)

## 2014-04-19 LAB — I-STAT TROPONIN, ED: Troponin i, poc: 0.01 ng/mL (ref 0.00–0.08)

## 2014-04-19 LAB — CBC
HCT: 34 % — ABNORMAL LOW (ref 36.0–46.0)
Hemoglobin: 11 g/dL — ABNORMAL LOW (ref 12.0–15.0)
MCH: 31.3 pg (ref 26.0–34.0)
MCHC: 32.4 g/dL (ref 30.0–36.0)
MCV: 96.6 fL (ref 78.0–100.0)
Platelets: 221 10*3/uL (ref 150–400)
RBC: 3.52 MIL/uL — ABNORMAL LOW (ref 3.87–5.11)
RDW: 13.1 % (ref 11.5–15.5)
WBC: 6.5 10*3/uL (ref 4.0–10.5)

## 2014-04-19 LAB — GLUCOSE, CAPILLARY
Glucose-Capillary: 128 mg/dL — ABNORMAL HIGH (ref 70–99)
Glucose-Capillary: 141 mg/dL — ABNORMAL HIGH (ref 70–99)
Glucose-Capillary: 183 mg/dL — ABNORMAL HIGH (ref 70–99)
Glucose-Capillary: 203 mg/dL — ABNORMAL HIGH (ref 70–99)
Glucose-Capillary: 171 mg/dL — ABNORMAL HIGH (ref 70–99)

## 2014-04-19 LAB — CBG MONITORING, ED: Glucose-Capillary: 91 mg/dL (ref 70–99)

## 2014-04-19 LAB — BRAIN NATRIURETIC PEPTIDE
B Natriuretic Peptide: 120.6 pg/mL — ABNORMAL HIGH (ref 0.0–100.0)
B Natriuretic Peptide: 74.6 pg/mL (ref 0.0–100.0)

## 2014-04-19 MED ORDER — ALBUTEROL SULFATE (2.5 MG/3ML) 0.083% IN NEBU
2.5000 mg | INHALATION_SOLUTION | Freq: Four times a day (QID) | RESPIRATORY_TRACT | Status: DC | PRN
  Administered 2014-04-20: 2.5 mg via RESPIRATORY_TRACT
  Filled 2014-04-19: qty 3

## 2014-04-19 MED ORDER — CLOPIDOGREL BISULFATE 75 MG PO TABS
75.0000 mg | ORAL_TABLET | Freq: Every morning | ORAL | Status: DC
  Administered 2014-04-19 – 2014-04-21 (×3): 75 mg via ORAL
  Filled 2014-04-19 (×3): qty 1

## 2014-04-19 MED ORDER — INSULIN NPH (HUMAN) (ISOPHANE) 100 UNIT/ML ~~LOC~~ SUSP: 20.0000 [IU] | Freq: Every day | SUBCUTANEOUS | Status: DC

## 2014-04-19 MED ORDER — SODIUM CHLORIDE 0.9 % IV SOLN
INTRAVENOUS | Status: DC
  Administered 2014-04-19: 04:00:00 via INTRAVENOUS

## 2014-04-19 MED ORDER — PROPRANOLOL HCL 40 MG PO TABS
40.0000 mg | ORAL_TABLET | Freq: Every morning | ORAL | Status: DC
  Administered 2014-04-20 – 2014-04-21 (×2): 40 mg via ORAL
  Filled 2014-04-19 (×3): qty 1

## 2014-04-19 MED ORDER — HYDROMORPHONE HCL 1 MG/ML IJ SOLN
0.5000 mg | INTRAMUSCULAR | Status: DC | PRN
  Administered 2014-04-19: 1 mg via INTRAVENOUS
  Filled 2014-04-19: qty 1

## 2014-04-19 MED ORDER — SODIUM CHLORIDE 0.9 % IJ SOLN
3.0000 mL | Freq: Two times a day (BID) | INTRAMUSCULAR | Status: DC
  Administered 2014-04-19 (×2): 3 mL via INTRAVENOUS

## 2014-04-19 MED ORDER — NYSTATIN 100000 UNIT/ML MT SUSP
5.0000 mL | Freq: Every day | OROMUCOSAL | Status: DC
  Administered 2014-04-19 – 2014-04-21 (×2): 500000 [IU] via ORAL
  Filled 2014-04-19 (×3): qty 5

## 2014-04-19 MED ORDER — SUCRALFATE 1 G PO TABS
1.0000 g | ORAL_TABLET | Freq: Every day | ORAL | Status: DC
  Administered 2014-04-19 – 2014-04-21 (×3): 1 g via ORAL
  Filled 2014-04-19: qty 1
  Filled 2014-04-19: qty 2
  Filled 2014-04-19: qty 1

## 2014-04-19 MED ORDER — LINACLOTIDE 145 MCG PO CAPS
290.0000 ug | ORAL_CAPSULE | Freq: Every morning | ORAL | Status: DC
  Administered 2014-04-19 – 2014-04-20 (×2): 290 ug via ORAL
  Filled 2014-04-19 (×3): qty 2

## 2014-04-19 MED ORDER — TECHNETIUM TC 99M DIETHYLENETRIAME-PENTAACETIC ACID: 40.0000 | Freq: Once | INTRAVENOUS | Status: AC | PRN

## 2014-04-19 MED ORDER — INSULIN NPH (HUMAN) (ISOPHANE) 100 UNIT/ML ~~LOC~~ SUSP
15.0000 [IU] | Freq: Every day | SUBCUTANEOUS | Status: DC
  Administered 2014-04-20 – 2014-04-21 (×2): 15 [IU] via SUBCUTANEOUS
  Filled 2014-04-19: qty 10

## 2014-04-19 MED ORDER — TECHNETIUM TO 99M ALBUMIN AGGREGATED
6.0000 | Freq: Once | INTRAVENOUS | Status: AC | PRN
  Administered 2014-04-19: 6 via INTRAVENOUS

## 2014-04-19 MED ORDER — ACETAMINOPHEN 325 MG PO TABS
650.0000 mg | ORAL_TABLET | Freq: Four times a day (QID) | ORAL | Status: DC | PRN
Start: 2014-04-19 — End: 2014-04-21

## 2014-04-19 MED ORDER — BUSPIRONE HCL 5 MG PO TABS
7.5000 mg | ORAL_TABLET | Freq: Every morning | ORAL | Status: DC
  Administered 2014-04-19 – 2014-04-21 (×3): 7.5 mg via ORAL
  Filled 2014-04-19 (×4): qty 2

## 2014-04-19 MED ORDER — ACETAMINOPHEN 650 MG RE SUPP
650.0000 mg | Freq: Four times a day (QID) | RECTAL | Status: DC | PRN
Start: 2014-04-19 — End: 2014-04-21

## 2014-04-19 MED ORDER — INSULIN ASPART 100 UNIT/ML ~~LOC~~ SOLN: 0.0000 [IU] | Freq: Every day | SUBCUTANEOUS | Status: DC

## 2014-04-19 MED ORDER — INSULIN ASPART 100 UNIT/ML ~~LOC~~ SOLN
0.0000 [IU] | SUBCUTANEOUS | Status: DC
  Administered 2014-04-19: 2 [IU] via SUBCUTANEOUS
  Administered 2014-04-19: 3 [IU] via SUBCUTANEOUS

## 2014-04-19 MED ORDER — INSULIN ASPART 100 UNIT/ML ~~LOC~~ SOLN
4.0000 [IU] | Freq: Three times a day (TID) | SUBCUTANEOUS | Status: DC
  Administered 2014-04-19 – 2014-04-21 (×5): 4 [IU] via SUBCUTANEOUS

## 2014-04-19 MED ORDER — ONDANSETRON HCL 4 MG/2ML IJ SOLN
4.0000 mg | Freq: Four times a day (QID) | INTRAMUSCULAR | Status: DC | PRN
  Administered 2014-04-19: 4 mg via INTRAVENOUS

## 2014-04-19 MED ORDER — CLONAZEPAM 0.5 MG PO TABS
0.5000 mg | ORAL_TABLET | Freq: Every day | ORAL | Status: DC
  Administered 2014-04-19 – 2014-04-20 (×2): 0.5 mg via ORAL
  Filled 2014-04-19 (×2): qty 1

## 2014-04-19 MED ORDER — LIDOCAINE 5 % EX PTCH
3.0000 | MEDICATED_PATCH | CUTANEOUS | Status: DC
  Administered 2014-04-19 – 2014-04-20 (×2): 3 via TRANSDERMAL
  Filled 2014-04-19 (×3): qty 3

## 2014-04-19 MED ORDER — ENOXAPARIN SODIUM 80 MG/0.8ML ~~LOC~~ SOLN
70.0000 mg | SUBCUTANEOUS | Status: DC
  Administered 2014-04-20 – 2014-04-21 (×2): 70 mg via SUBCUTANEOUS
  Filled 2014-04-19 (×2): qty 0.8

## 2014-04-19 MED ORDER — CLONAZEPAM 1 MG PO TABS
1.0000 mg | ORAL_TABLET | Freq: Every day | ORAL | Status: DC
  Administered 2014-04-19 – 2014-04-21 (×3): 1 mg via ORAL
  Filled 2014-04-19 (×3): qty 1

## 2014-04-19 MED ORDER — ONDANSETRON HCL 4 MG PO TABS: 4.0000 mg | ORAL_TABLET | Freq: Four times a day (QID) | ORAL | Status: DC | PRN

## 2014-04-19 MED ORDER — ROSUVASTATIN CALCIUM 10 MG PO TABS
20.0000 mg | ORAL_TABLET | Freq: Every morning | ORAL | Status: DC
  Administered 2014-04-19 – 2014-04-21 (×3): 20 mg via ORAL
  Filled 2014-04-19 (×3): qty 2

## 2014-04-19 MED ORDER — LEVOTHYROXINE SODIUM 100 MCG PO TABS
100.0000 ug | ORAL_TABLET | Freq: Every day | ORAL | Status: DC
  Administered 2014-04-20 – 2014-04-21 (×2): 100 ug via ORAL
  Filled 2014-04-19 (×2): qty 1

## 2014-04-19 MED ORDER — ENOXAPARIN SODIUM 150 MG/ML ~~LOC~~ SOLN
135.0000 mg | Freq: Once | SUBCUTANEOUS | Status: AC
  Administered 2014-04-19: 135 mg via SUBCUTANEOUS
  Filled 2014-04-19 (×2): qty 1

## 2014-04-19 MED ORDER — ALBUTEROL SULFATE HFA 108 (90 BASE) MCG/ACT IN AERS: 2.0000 | INHALATION_SPRAY | Freq: Four times a day (QID) | RESPIRATORY_TRACT | Status: DC | PRN

## 2014-04-19 MED ORDER — ENOXAPARIN SODIUM 150 MG/ML ~~LOC~~ SOLN: 135.0000 mg | Freq: Two times a day (BID) | SUBCUTANEOUS | Status: DC

## 2014-04-19 MED ORDER — INSULIN ASPART 100 UNIT/ML ~~LOC~~ SOLN
0.0000 [IU] | Freq: Three times a day (TID) | SUBCUTANEOUS | Status: DC
  Administered 2014-04-19: 3 [IU] via SUBCUTANEOUS
  Administered 2014-04-20 (×2): 2 [IU] via SUBCUTANEOUS
  Administered 2014-04-20: 1 [IU] via SUBCUTANEOUS
  Administered 2014-04-21: 2 [IU] via SUBCUTANEOUS

## 2014-04-19 NOTE — Care Management Note (Signed)
Date Additional Medicare IM given:  04/19/14  Additional Medicare IM given by:  Keyona Emrich, RN, BSN, CCM   

## 2014-04-19 NOTE — Progress Notes (Signed)
ANTICOAGULATION CONSULT NOTE - Initial Consult  Pharmacy Consult for Lovenox Indication: r/o PE  Allergies  Allergen Reactions  . Iodine Other (See Comments)    ONLY IV--Makes unconscious  . Metoclopramide Hcl Other (See Comments)    suicidal  . Sulfa Antibiotics Hives  . Iohexol      Code: SOB, Desc: cardiac arrest w/ iv contrast, has never used 13 hr prep//alice calhoun, Onset Date: [id number]   . Lactose Intolerance (Gi) Diarrhea  . Lansoprazole Nausea And Vomiting    Patient Measurements: Height: 5' 7.5" (171.5 cm) Weight: 298 lb (135.172 kg) IBW/kg (Calculated) : 62.75  Vital Signs: Temp: 98.6 F (37 C) (03/11 2022) Temp Source: Oral (03/11 2022) BP: 133/58 mmHg (03/12 0100) Pulse Rate: 44 (03/12 0100)  Labs:  Recent Labs  04/18/14 2035  HGB 10.8*  HCT 33.2*  PLT 238  CREATININE 1.53*    Estimated Creatinine Clearance: 46 mL/min (by C-G formula based on Cr of 1.53).   Medical History: Past Medical History  Diagnosis Date  . DVT (deep venous thrombosis)   . Asthma   . Hypothyroidism   . Anxiety   . Depression   . H/O hiatal hernia   . Arthritis   . Anemia   . PE (pulmonary thromboembolism) x 3, last 14 years ago    history of recurrent RLE DVT with PE - last PE ~>13 yrs; Maintained on Plavix  . Hyperlipidemia   . Diabetes mellitus type 2, insulin dependent   . Hypertension associated with diabetes   . Glaucoma   . Cataract   . Diabetic neuropathy   . Palpitations 35years ago    Cardionet monitor - revealed mostly normal sinus rhythm, sinus bradycardia with first-degree A-V block heart rates mostly in the 50s and 60s with some 70s. No arrhythmias, PVCs or PACs noted.  Marland Kitchen TIA (transient ischemic attack) March 2014  . Diastolic dysfunction, left ventricle     mild LVH, EF 55% by echo; no ischemia by stress 01/2010 echo [facility])  . Stroke 04-19-2013    tia and stroke. Weakness Rt hand  . Sleep apnea     cpap disontinued; test done 07/14/2008 ordered  per  Byrum  . Sleep apnea   . Restless legs syndrome   . Spinal stenosis     L 3, L 4 and L 5, and C 1, C 2 and C3  . Complication of anesthesia     hard to wake up after anesthesia, trouble turning head  . Spinal headache       Assessment: 74yo female c/o "strange feeling" in BLE w/ R flank pain, has h/o VTE, concern for PE, awaiting VQ scan, to begin LMWH while awaiting study results.  Goal of Therapy:  Anti-Xa level 0.6-1 units/ml 4hrs after LMWH dose given Monitor platelets by anticoagulation protocol: Yes   Plan:  Will begin Lovenox 135mg  SQ Q12H and monitor CBC; f/u study results and requirement for long-term anticoag.  Wynona Neat, PharmD, BCPS  04/19/2014,2:05 AM

## 2014-04-19 NOTE — Progress Notes (Signed)
UR completed 

## 2014-04-19 NOTE — ED Notes (Signed)
Checked CBG at pt's request, CBG 91 at this time.

## 2014-04-19 NOTE — ED Notes (Signed)
Pt to xray at this time.

## 2014-04-19 NOTE — Progress Notes (Signed)
RNCM called re: pt's difficulties affording her meds. Assistance may be limited as pt is insured by [employer], however.  CSW signing off.  Please re-consult if SW needs arise.

## 2014-04-19 NOTE — ED Notes (Addendum)
Pt requested CBG, result 91. Pt states this is low for her and she would like some juice. Pt status in NPO per signed and held orders from Dr. Arnoldo Morale. Pt made aware that 91 is a safe CBG at this time and it will be monitored appropriately.

## 2014-04-19 NOTE — Evaluation (Signed)
Physical Therapy Evaluation Patient Details Name: Tara Jackson MRN: [id number] DOB: 1940-05-01 Today's Date: 04/19/2014   History of Present Illness  Pt admit with chest pain.  W/U for PE as well with negative VQ scan.   Clinical Impression  Pt admitted with above diagnosis. Pt currently with functional limitations due to the deficits listed below (see PT Problem List). Pt with vertigo but difficult assessment due to pt limited in movement due to self limiting and some nausea.  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  Pt is safe walking with RW and will have husband at home.  Will benefit from HHPT vestibular assessment.   Pt will benefit from skilled PT to increase their independence and safety with mobility to allow discharge to the venue listed below.      Follow Up Recommendations Home health PT;Supervision/Assistance - 24 hour (for vestibular assessment)    Equipment Recommendations  None recommended by PT    Recommendations for Other Services       Precautions / Restrictions Precautions Precautions: Fall Restrictions Weight Bearing Restrictions: No      Mobility  Bed Mobility Overal bed mobility: Needs Assistance Bed Mobility: Supine to Sit     Supine to sit: Min assist     General bed mobility comments: Needs a little assist to complete movement from supine to sit.   Transfers Overall transfer level: Needs assistance Equipment used: Rolling walker (2 wheeled) Transfers: Sit to/from Stand Sit to Stand: Min assist         General transfer comment: Pt needed a little assist to power up from chair.  Once up steady with RW.   Ambulation/Gait Ambulation/Gait assistance: Min guard Ambulation Distance (Feet): 30 Feet Assistive device: Rolling walker (2 wheeled) Gait Pattern/deviations: Step-through pattern;Decreased stride length   Gait velocity interpretation: Below normal speed for  age/gender General Gait Details: Pt reported feeling a little dizzy therefore decided to go back to bed and assess dizziness.  So shortened walk.  Pt is very safe with RW overall.    Stairs            Wheelchair Mobility    Modified Rankin (Stroke Patients Only)       Balance Overall balance assessment: Needs assistance;History of Falls Sitting-balance support: No upper extremity supported;Feet supported Sitting balance-Leahy Scale: Good     Standing balance support: Bilateral upper extremity supported;During functional activity Standing balance-Leahy Scale: Fair Standing balance comment: can stand statically without UE support.                             Pertinent Vitals/Pain Pain Assessment: No/denies pain  VSS    Home Living Family/patient expects to be discharged to:: Private residence Living Arrangements: Spouse/significant other Available Help at Discharge: Family;Available 24 hours/day Type of Home: House Home Access: Stairs to enter Entrance Stairs-Rails: None Entrance Stairs-Number of Steps: 1 Home Layout: One level Home Equipment: Walker - 2 wheels;Cane - single point;Bedside commode;Shower seat;Wheelchair - power;Electric scooter Additional Comments: Pt reports she mainly uses her Hoverround at home and uses furniture to hold onto at home as well.     Prior Function Level of Independence: Needs assistance   Gait / Transfers Assistance Needed: Uses hoverround mostly.  Admits she rarely uses walker.  ADL's / Homemaking Assistance Needed: Prn assist by husband        Hand Dominance  Extremity/Trunk Assessment   Upper Extremity Assessment: Defer to OT evaluation           Lower Extremity Assessment: Generalized weakness      Cervical / Trunk Assessment: Normal  Communication   Communication: No difficulties  Cognition Arousal/Alertness: Awake/alert Behavior During Therapy: WFL for tasks assessed/performed Overall  Cognitive Status: History of cognitive impairments - at baseline                      General Comments General comments (skin integrity, edema, etc.): Difficult to assess vestibular system as pt has diffculty following all commands.  Unsure if this was due to meds she had earlier vs. limitations in neck.  Pt horizontal canals with negative symptoms.  Pt had positive symptoms for left BPPV however pt was nauseous and stated she could not tolerate further testing.  Pt spitting in basin but was not throwing up.  Looked like some phlegm but not throw up.  Feel that pt most likely either had left BPPV or a left hypofunction.  Feel that HHPT for vestibular rehab can address pts vertigo.  Nursing aware.  Pt aware that husband needs to stay with her at all times on d/c and that she should have her RW with her or use hoveround until she can get vertigo treated.  This PT somewhat concerned as treatment may be difficult given that pt does not follow commands well.      Exercises        Assessment/Plan    PT Assessment Patient needs continued PT services  PT Diagnosis Generalized weakness (dizziness)   PT Problem List Decreased balance;Decreased mobility;Decreased knowledge of use of DME;Decreased safety awareness;Decreased knowledge of precautions;Decreased activity tolerance (dizziness)  PT Treatment Interventions DME instruction;Gait training;Functional mobility training;Therapeutic activities;Therapeutic exercise;Balance training;Patient/family education (vestibular assessment)   PT Goals (Current goals can be found in the Care Plan section) Acute Rehab PT Goals Patient Stated Goal: to get better PT Goal Formulation: With patient Time For Goal Achievement: 05/03/14 Potential to Achieve Goals: Good    Frequency Min 3X/week   Barriers to discharge        Co-evaluation               End of Session Equipment Utilized During Treatment: Gait belt Activity Tolerance: Patient limited  by fatigue (limited by dizziness) Patient left: in bed;with call bell/phone within reach Nurse Communication: Mobility status    Functional Assessment Tool Used: clinical judgment Functional Limitation: Mobility: Walking and moving around Mobility: Walking and Moving Around Current Status (X3244): At least 1 percent but less than 20 percent impaired, limited or restricted Mobility: Walking and Moving Around Goal Status [phone]): At least 1 percent but less than 20 percent impaired, limited or restricted    Time: 2536-6440 PT Time Calculation (min) (ACUTE ONLY): 19 min   Charges:   PT Evaluation $Initial PT Evaluation Tier I: 1 Procedure     PT G Codes:   PT G-Codes **NOT FOR INPATIENT CLASS** Functional Assessment Tool Used: clinical judgment Functional Limitation: Mobility: Walking and moving around Mobility: Walking and Moving Around Current Status (H4742): At least 1 percent but less than 20 percent impaired, limited or restricted Mobility: Walking and Moving Around Goal Status [phone]): At least 1 percent but less than 20 percent impaired, limited or restricted    Berline Lopes 04/19/2014, 2:06 PM Karel Turpen,PT Acute Rehabilitation [phone] (pager)

## 2014-04-19 NOTE — Progress Notes (Signed)
VASCULAR LAB PRELIMINARY  PRELIMINARY  PRELIMINARY  PRELIMINARY  Bilateral lower extremity venous Dopplers completed.    Preliminary report:  There is no obvious evidence of DVT or SVT noted in the bilateral lower extremities.   Haydin Dunn, RVT 04/19/2014, 4:01 PM

## 2014-04-19 NOTE — Progress Notes (Addendum)
PROGRESS NOTE    Tara Jackson [id number] DOB: 1940-09-16 DOA: 04/18/2014 PCP: [name], MD  Primary Cardiology: Dr. Glenetta Hew.  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 She denies ever having chest pain during this episode. She was concerned about her dyspnea relating to prior episodes of PE and hence presented to the ED. Back pain improved to 4/10. No further dyspnea. VQ scan negative. Noted new AKI-unclear etiology.  Assessment/Plan:  1. Dyspnea: Resolved. Etiology not clear. Chest x-ray suggestive of atelectasis. VQ scan: Normal. No features suggestive of overt CHF.? Anxiety. 2. Acute kidney injury: Unclear etiology. Creatinine 1.04 (08/05/13). Increase to 1.53. Improving. Brief IV fluid hydration and follow BMP. 3. Subacute on chronic low back pain: Possibly related to spinal stenosis and DJD. Improving. 4. Chronic anemia: Stable 5. Uncontrolled type II DM/IDDM: Place on reduced dose home NPH insulin, NovoLog mealtime and SSI. 6. Chronic diastolic CHF: Compensated 7. Essential hypertension: Controlled. Hold HCTZ in the context of acute kidney injury. Place on reduced dose propranolol. 8. 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 Currently on Plavix. DC full dose Lovenox. 9. Asthma: stable. 10. Bradycardia: Due to beta blocker. Reduce dose. Monitor on telemetry   Code Status: Full Family Communication: Discussed with spouse at bedside. Disposition Plan: Home possibly 3/13   Consultants:  None  Procedures:  None  Antibiotics:  None   Subjective: No further dyspnea. Denies ever having chest pain during this  entire episode. Back pain improved.  Objective: Filed Vitals:   04/19/14 0030 04/19/14 0100 04/19/14 0306 04/19/14 0500  BP: 109/85 133/58 158/58 112/58  Pulse: 42 44 52 50  Temp:   98.1 F (36.7 C) 97.6 F (36.4 C)  TempSrc:   Oral Oral  Resp: 15 21    Height:   5' 7.5" (1.715 m) 5' 7.5" (1.715 m)  Weight:   134.219 kg (295 lb 14.4 oz) 133.993 kg (295 lb 6.4 oz)  SpO2: 99% 100% 98% 94%   No intake or output data in the 24 hours ending 04/19/14 1409 Filed Weights   04/18/14 2022 04/19/14 0306 04/19/14 0500  Weight: 135.172 kg (298 lb) 134.219 kg (295 lb 14.4 oz) 133.993 kg (295 lb 6.4 oz)     Exam:  General exam: Moderately built and morbidly obese female lying comfortably supine in bed. Respiratory system: Clear. No increased work of breathing. Cardiovascular system: S1 & S2 heard, RRR. No JVD, murmurs, gallops, clicks. Trace ankle edema. Telemetry: Sinus bradycardia in the mid 40s-50s. Gastrointestinal system: Abdomen is nondistended, soft and nontender. Normal bowel sounds heard. Central nervous system: Alert and oriented. No focal neurological deficits. Extremities: Symmetric 5 x 5 power.   Data Reviewed: Basic Metabolic Panel:  Recent Labs Lab 04/18/14 2035 04/19/14 0900  NA 140 142  K 4.7 4.3  CL 105 106  CO2 28 27  GLUCOSE 230* 164*  BUN 20 20  CREATININE 1.53* 1.44*  CALCIUM 9.3 9.1   Liver Function Tests:  Recent Labs Lab 04/18/14 2035  AST 16  ALT 10  ALKPHOS 81  BILITOT 0.5  PROT 6.6  ALBUMIN 3.3*   No results for input(s): LIPASE, AMYLASE in the last 168 hours. No results  for input(s): AMMONIA in the last 168 hours. CBC:  Recent Labs Lab 04/18/14 2035 04/19/14 0900  WBC 6.2 6.5  NEUTROABS 3.6  --   HGB 10.8* 11.0*  HCT 33.2* 34.0*  MCV 94.9 96.6  PLT 238 221   Cardiac Enzymes:  Recent Labs Lab 04/19/14 0244 04/19/14 0900  TROPONINI <0.03 <0.03   BNP (last 3 results) No results for input(s): PROBNP in the last 8760  hours. CBG:  Recent Labs Lab 04/19/14 0226 04/19/14 0419 04/19/14 0758 04/19/14 1142  GLUCAP 91 128* 141* 183*    No results found for this or any previous visit (from the past 240 hour(s)).       Studies: Dg Chest 2 View  04/19/2014   CLINICAL DATA:  Strange feeling in both legs since yesterday, with right flank pain. Shortness of breath and lower extremity swelling. Initial encounter.  EXAM: CHEST  2 VIEW  COMPARISON:  Chest radiograph performed 05/11/2013, and CT of the chest performed 06/05/2008  FINDINGS: The lungs are to be well aerated. Mild right base opacity likely reflects atelectasis. There is no evidence of pleural effusion or pneumothorax.  The heart is mildly enlarged. No acute osseous abnormalities are seen.  IMPRESSION: Mild right basilar opacity likely reflects atelectasis. Mild cardiomegaly noted.   Electronically Signed   By: Garald Balding M.D.   On: 04/19/2014 02:13   Nm Pulmonary Perf And Vent  04/19/2014   CLINICAL DATA:  Chest and back pain  EXAM: NUCLEAR MEDICINE VENTILATION - PERFUSION LUNG SCAN  TECHNIQUE: Ventilation images were obtained in multiple projections using inhaled aerosol technetium 99 M DTPA. Perfusion images were obtained in multiple projections after intravenous injection of Tc-31m MAA.  RADIOPHARMACEUTICALS:  40.0 mCi Tc-25m DTPA aerosol and 6.0 mCi Tc-34m MAA  COMPARISON:  Chest radiograph from 04/19/2014  FINDINGS: Ventilation: No focal ventilation defect.  Perfusion: No wedge shaped peripheral perfusion defects to suggest acute pulmonary embolism.  IMPRESSION: 1. Normal study.  No evidence for pulmonary embolus.   Electronically Signed   By: Kerby Moors M.D.   On: 04/19/2014 13:29        Scheduled Meds: . busPIRone  7.5 mg Oral q morning - 10a  . clonazePAM  0.5-1 mg Oral BID  . clopidogrel  75 mg Oral q morning - 10a  . enoxaparin (LOVENOX) injection  135 mg Subcutaneous Q12H  . insulin aspart  0-5 Units Subcutaneous QHS  . insulin  aspart  0-9 Units Subcutaneous TID WC  . insulin aspart  4 Units Subcutaneous TID WC  . insulin NPH Human  15 Units Subcutaneous QAC breakfast  . levothyroxine  100 mcg Oral q morning - 10a  . lidocaine  3 patch Transdermal Q24H  . Linaclotide  290 mcg Oral q morning - 10a  . nystatin  5 mL Oral Daily  . propranolol  40 mg Oral q morning - 10a  . rosuvastatin  20 mg Oral q morning - 10a  . sodium chloride  3 mL Intravenous Q12H  . sucralfate  1-2 g Oral Daily   Continuous Infusions: . sodium chloride 50 mL/hr at 04/19/14 0348    Principal Problem:   Pleuritic chest pain Active Problems:   Hyperlipidemia with target LDL less than 130   Essential hypertension   Intrinsic asthma   Diastolic dysfunction, left ventricle   Chest pain   Diabetes mellitus   Edema of both legs    Time spent: 30 minutes.    Vernell Leep, MD, FACP, FHM. [facility] Pager [phone]  If 7PM-7AM, please contact night-coverage [email or web address] Password [facility] 04/19/2014, 2:09 PM

## 2014-04-19 NOTE — H&P (Signed)
[facility] Admission History and Physical       Tara Jackson [id number] DOB: [birth year] DOA: 04/18/2014  Referring physician:  EDP PCP: [name], MD  Specialists:   Chief Complaint: Chest Pain  HPI: Tara Jackson is a 74 y.o. female with a history of Diastolic CHF, HTN, DM2, and hyperlipidemia who presents to the ED with complaints of Chest and back pain worsen with a deep breaths that started at 4: 30 PM.   She also has complaints of increased edema of both of her legs.   She has a remote history of a PE and is concerned that she may have another PE.  She has been administered Full dose Lovenox and referred for admission, and a VQ scan has been ordered for the AM.      Review of Systems: Constitutional: No Weight Loss, No Weight Gain, Night Sweats, Fevers, Chills, Dizziness, Light Headedness, Fatigue, or Generalized Weakness HEENT: No Headaches, Difficulty Swallowing,Tooth/Dental Problems,Sore Throat,  No Sneezing, Rhinitis, Ear Ache, Nasal Congestion, or Post Nasal Drip,  Cardio-vascular:   +Chest pain, Orthopnea, PND, +Edema in Lower Extremities, Anasarca, Dizziness, Palpitations  Resp: +Dyspnea, No DOE, No Productive Cough, No Non-Productive Cough, No Hemoptysis, No Wheezing.    GI: No Heartburn, Indigestion, Abdominal Pain, Nausea, Vomiting, Diarrhea, Constipation, Hematemesis, Hematochezia, Melena, Change in Bowel Habits,  Loss of Appetite  GU: No Dysuria, No Change in Color of Urine, No Urgency or Urinary Frequency, No Flank pain.  Musculoskeletal: No Joint Pain or Swelling, No Decreased Range of Motion, No Back Pain.  Neurologic: No Syncope, No Seizures, Muscle Weakness, Paresthesia, Vision Disturbance or Loss, No Diplopia, No Vertigo, No Difficulty Walking,  Skin: No Rash or Lesions. Psych: No Change in Mood or Affect, No Depression or Anxiety, No Memory loss, No Confusion, or Hallucinations   Past Medical History  Diagnosis Date  . DVT (deep venous  thrombosis)   . Asthma   . Hypothyroidism   . Anxiety   . Depression   . H/O hiatal hernia   . Arthritis   . Anemia   . PE (pulmonary thromboembolism) x 3, last 14 years ago    history of recurrent RLE DVT with PE - last PE ~>13 yrs; Maintained on Plavix  . Hyperlipidemia   . Diabetes mellitus type 2, insulin dependent   . Hypertension associated with diabetes   . Glaucoma   . Cataract   . Diabetic neuropathy   . Palpitations 35years ago    Cardionet monitor - revealed mostly normal sinus rhythm, sinus bradycardia with first-degree A-V block heart rates mostly in the 50s and 60s with some 70s. No arrhythmias, PVCs or PACs noted.  Marland Kitchen TIA (transient ischemic attack) March 2014  . Diastolic dysfunction, left ventricle     mild LVH, EF 55% by echo; no ischemia by stress 01/2010 echo [facility])  . Stroke 04-19-2013    tia and stroke. Weakness Rt hand  . Sleep apnea     cpap disontinued; test done 07/14/2008 ordered per  [place]  . Sleep apnea   . Restless legs syndrome   . Spinal stenosis     L 3, L 4 and L 5, and C 1, C 2 and C3  . Complication of anesthesia     hard to wake up after anesthesia, trouble turning head  . Spinal headache      Past Surgical History  Procedure Laterality Date  . Abdominal hysterectomy    . Cholecystectomy    . Back  surgery      steroid inj  . Tonsillectomy    . Appendectomy    . Esophagogastroduodenoscopy  08/09/2011    Procedure: ESOPHAGOGASTRODUODENOSCOPY (EGD);  Surgeon: Jeryl Columbia, MD;  Location: [facility];  Service: Endoscopy;  Laterality: N/A;  . Hot hemostasis  08/09/2011    Procedure: HOT HEMOSTASIS (ARGON PLASMA COAGULATION/BICAP);  Surgeon: Jeryl Columbia, MD;  Location: [facility];  Service: Endoscopy;  Laterality: N/A;  . Trigger finger release  11/22/2011    Procedure: RELEASE TRIGGER FINGER/A-1 PULLEY;  Surgeon: Meredith Pel, MD;  Location: [facility];  Service: Orthopedics;  Laterality: Left;  Left trigger thumb release  . Cardiac  catheterization  02/14/2012    no evidence of obstructive coronary disease ,low EDP with normal EF  . Doppler echocardiography  04/19/2012    EF 0000000; normal diastolic pressures, no regional wall motion motion abnormalities  . Nm myocar perf wall motion  01/26/2012    EF 79%,LV normal,ST SEGMENT CHANGE SUGGESTIVE OF ISCHEMIA.  Eusebio Me doppler  05/29/2012    RIGHT EXTREM. NORMAL VENOUS DUPLEX  . Joint replacement      right knee x 1, left knee x 2  . Left knee spacer bar placement  done with 2nd left knee replacement  . Eye surgery Bilateral 4-5-yrs ago  . Right trigger finger release  yrs ago  . Esophagogastroduodenoscopy (egd) with propofol N/A 11/12/2013    Procedure: ESOPHAGOGASTRODUODENOSCOPY (EGD) WITH PROPOFOL;  Surgeon: Jeryl Columbia, MD;  Location: [facility];  Service: Endoscopy;  Laterality: N/A;  . Hot hemostasis N/A 11/12/2013    Procedure: HOT HEMOSTASIS (ARGON PLASMA COAGULATION/BICAP);  Surgeon: Jeryl Columbia, MD;  Location: [facility];  Service: Endoscopy;  Laterality: N/A;  . Left heart catheterization with coronary angiogram N/A 02/14/2012    Procedure: LEFT HEART CATHETERIZATION WITH CORONARY ANGIOGRAM;  Surgeon: Leonie Man, MD;  Location: [facility] CATH LAB;  Service: Cardiovascular;  Laterality: N/A;      Prior to Admission medications   Medication Sig Start Date End Date Taking? Authorizing Provider  albuterol (PROVENTIL HFA;VENTOLIN HFA) 108 (90 BASE) MCG/ACT inhaler Inhale 2 puffs into the lungs every 6 (six) hours as needed for wheezing.   Yes Historical Provider, MD  busPIRone (BUSPAR) 7.5 MG tablet Take 7.5 mg by mouth every morning.    Yes Historical Provider, MD  Cholecalciferol (VITAMIN D-3 PO) Take 1 tablet by mouth every morning.    Yes Historical Provider, MD  clonazePAM (KLONOPIN) 0.5 MG tablet Take 0.5-1 mg by mouth 2 (two) times daily. 1 mg (2 tablets) in the morning and 0.5 mg (1 tablet) at night   Yes Historical Provider, MD  clopidogrel (PLAVIX) 75 MG  tablet Take 75 mg by mouth every morning.   Yes Historical Provider, MD  dicyclomine (BENTYL) 10 MG capsule Take 10 mg by mouth 3 (three) times daily as needed for spasms.    Yes Historical Provider, MD  esomeprazole (NEXIUM) 40 MG capsule Take 40 mg by mouth daily before breakfast.   Yes Historical Provider, MD  glipiZIDE (GLUCOTROL) 10 MG tablet Take 20 mg by mouth 2 (two) times daily before a meal.    Yes Historical Provider, MD  hydrochlorothiazide (MICROZIDE) 12.5 MG capsule Take 12.5 mg by mouth daily as needed (swelling.).   Yes Historical Provider, MD  insulin NPH (HUMULIN N,NOVOLIN N) 100 UNIT/ML injection Inject 30 Units into the skin daily before breakfast. Novolin-N   Yes Historical Provider, MD  insulin regular (NOVOLIN R,HUMULIN  R) 100 units/mL injection Inject 20 Units into the skin 3 (three) times daily before meals. Inject 20 units at breakfast, 20 units at lunchtime, and 20 units at dinner.   Yes Historical Provider, MD  levothyroxine (SYNTHROID, LEVOTHROID) 100 MCG tablet Take 100 mcg by mouth every morning.    Yes Historical Provider, MD  lidocaine (LIDODERM) 5 % Place 3 patches onto the skin daily. Remove & Discard patch within 12 hours or as directed by MD   Yes Historical Provider, MD  Linaclotide (LINZESS) 290 MCG CAPS capsule Take 290 mcg by mouth every morning.    Yes Historical Provider, MD  meclizine (ANTIVERT) 12.5 MG tablet Take 12.5 mg by mouth 3 (three) times daily as needed for dizziness.    Yes Historical Provider, MD  nystatin (MYCOSTATIN) 100000 UNIT/ML suspension Take 5 mLs by mouth daily.  04/17/14  Yes Historical Provider, MD  polyethylene glycol (MIRALAX / GLYCOLAX) packet Take 17 g by mouth daily as needed for mild constipation.   Yes Historical Provider, MD  potassium chloride (MICRO-K) 10 MEQ CR capsule Take 10 mEq by mouth daily as needed (leg cramps).    Yes Historical Provider, MD  propranolol (INDERAL) 60 MG tablet Take 60 mg by mouth every morning.    Yes  Historical Provider, MD  rosuvastatin (CRESTOR) 20 MG tablet Take 20 mg by mouth every morning.    Yes Historical Provider, MD  sucralfate (CARAFATE) 1 G tablet Take 1-2 g by mouth daily.    Yes Historical Provider, MD  HYDROcodone-acetaminophen (NORCO/VICODIN) 5-325 MG per tablet Take 2 tablets by mouth every 4 (four) hours as needed for moderate pain or severe pain. Patient not taking: Reported on 04/19/2014 08/05/13   Clayton Bibles, PA-C  ibuprofen (ADVIL,MOTRIN) 200 MG tablet Take 200 mg by mouth every 6 (six) hours as needed for mild pain.    Historical Provider, MD  magnesium citrate SOLN Take 1 Bottle by mouth once as needed for mild constipation or severe constipation.    Historical Provider, MD  methscopolamine (PAMINE FORTE) 5 MG tablet Take 5 mg by mouth 2 (two) times daily as needed (vertigo).     Historical Provider, MD  ondansetron (ZOFRAN) 4 MG tablet Take 1 tablet (4 mg total) by mouth every 8 (eight) hours as needed for nausea or vomiting. Patient not taking: Reported on 04/19/2014 08/05/13   Clayton Bibles, PA-C  primidone (MYSOLINE) 50 MG tablet Take 50 mg by mouth 3 (three) times daily.    Historical Provider, MD  Probiotic Product (PROBIOTIC PO) Take 1 capsule by mouth daily. Align.    Historical Provider, MD  Sennosides (EX-LAX PO) Take 1 tablet by mouth daily as needed (constipation).     Historical Provider, MD  Simethicone (PHAZYME) 180 MG CAPS Take 180 mg by mouth once as needed.     Historical Provider, MD  TERCONAZOLE VA Place 1 application vaginally daily as needed (for yeast infections).     Historical Provider, MD  traMADol-acetaminophen (ULTRACET) 37.5-325 MG per tablet Take 1-2 tablets by mouth every 6 (six) hours as needed for moderate pain. Take 1 to 2  Tablets every 6 to 8 hrs as needed for pain    Historical Provider, MD  valsartan (DIOVAN) 320 MG tablet Take 320 mg by mouth every morning. Brand Name: Diovan only.    Historical Provider, MD     Allergies  Allergen  Reactions  . Iodine Other (See Comments)    ONLY IV--Makes unconscious  . Metoclopramide Hcl  Other (See Comments)    suicidal  . Sulfa Antibiotics Hives  . Iohexol      Code: SOB, Desc: cardiac arrest w/ iv contrast, has never used 13 hr prep//alice calhoun, Onset Date: [id number]   . Lactose Intolerance (Gi) Diarrhea  . Lansoprazole Nausea And Vomiting    Social History:  reports that she has never smoked. She has never used smokeless tobacco. She reports that she does not drink alcohol or use illicit drugs.    Family History  Problem Relation Age of Onset  . Cancer Mother   . Cancer - Prostate Father        Physical Exam:  GEN:  Pleasant Obese Elderly 74 y.o. African American female examined and in no acute distress; cooperative with exam Filed Vitals:   04/18/14 2330 04/19/14 0000 04/19/14 0030 04/19/14 0100  BP: 133/30 134/48 109/85 133/58  Pulse: 41 43 42 44  Temp:      TempSrc:      Resp: 20 17 15 21   Height:      Weight:      SpO2: 99% 100% 99% 100%   Blood pressure 133/58, pulse 44, temperature 98.6 F (37 C), temperature source Oral, resp. rate 21, height 5' 7.5" (1.715 m), weight 135.172 kg (298 lb), SpO2 100 %. PSYCH: SHe is alert and oriented x4; does not appear anxious does not appear depressed; affect is normal HEENT: Normocephalic and Atraumatic, Mucous membranes pink; PERRLA; EOM intact; Fundi:  Benign;  No scleral icterus, Nares: Patent, Oropharynx: Clear, Fair Dentition,    Neck:  FROM, No Cervical Lymphadenopathy nor Thyromegaly or Carotid Bruit; No JVD; Breasts:: Not examined CHEST WALL: No tenderness CHEST: Normal respiration, clear to auscultation bilaterally HEART: Regular rate and rhythm; no murmurs rubs or gallops BACK: No kyphosis or scoliosis; No CVA tenderness ABDOMEN: Positive Bowel Sounds,  Obese, Soft Non-Tender, No Rebound or Guarding; No Masses, No Organomegaly. Rectal Exam: Not done EXTREMITIES: No Cyanosis, Clubbing, 3+ BLE  Edema; No  Ulcerations. Genitalia: not examined PULSES: 2+ and symmetric SKIN: Normal hydration no rash or ulceration CNS:  Alert and Oriented x 4, No Focal Deficits Vascular: pulses palpable throughout    Labs on Admission:  Basic Metabolic Panel:  Recent Labs Lab 04/18/14 2035  NA 140  K 4.7  CL 105  CO2 28  GLUCOSE 230*  BUN 20  CREATININE 1.53*  CALCIUM 9.3   Liver Function Tests:  Recent Labs Lab 04/18/14 2035  AST 16  ALT 10  ALKPHOS 81  BILITOT 0.5  PROT 6.6  ALBUMIN 3.3*   No results for input(s): LIPASE, AMYLASE in the last 168 hours. No results for input(s): AMMONIA in the last 168 hours. CBC:  Recent Labs Lab 04/18/14 2035  WBC 6.2  NEUTROABS 3.6  HGB 10.8*  HCT 33.2*  MCV 94.9  PLT 238   Cardiac Enzymes: No results for input(s): CKTOTAL, CKMB, CKMBINDEX, TROPONINI in the last 168 hours.  BNP (last 3 results)  Recent Labs  04/18/14 2319  BNP 120.6*    ProBNP (last 3 results) No results for input(s): PROBNP in the last 8760 hours.  CBG: No results for input(s): GLUCAP in the last 168 hours.  Radiological Exams on Admission: No results found.   EKG: Independently reviewed.    Assessment/Plan:   74 y.o. female with  Principal Problem:   1.   Pleuritic chest pain/Chest pain   CArdiac Monitoring   Full does Lovenox   V/Q scan to rule out PE  Active Problems:   2.   Edema of both legs   Venous Duplex Doppler of BLEs to rule out DVTs   Duirese PRN if Negative for DVTs     3.   Diastolic dysfunction, left ventricle   Add Lasix Rx       4.   Essential hypertension   Continue HCTZ   Monitor BPs     5.   Intrinsic asthma   Albuterol Nebs PRN     6.   Diabetes mellitus   SSI coverage PRN     7.   Hyperlipidemia with target LDL less than 130   Continue Crestor Rx     8.   DVT Prophylaxis    Full dose Lovenox ordered          9.   Bradycardia- due to Beta Blocker Rx   Hold Propanolol    Reduce Dosage    Code  Status:     FULL CODE     Family Communication:   Husband at Bedside   No Family Present    Disposition Plan:    Observation Status        Time spent:  [id number] 70 [facility] Pager [phone]   If [place] Please Contact the Day Rounding Team MD for [facility]  If 7PM-7AM, Please Contact Night-Floor Coverage  [email or web address] Password [email or web address] 04/19/2014, 2:12 AM     ADDENDUM:   Patient was seen and examined on 04/19/2014

## 2014-04-20 ENCOUNTER — Observation Stay (HOSPITAL_COMMUNITY): Payer: Medicare HMO

## 2014-04-20 DIAGNOSIS — N179 Acute kidney failure, unspecified: Secondary | ICD-10-CM

## 2014-04-20 LAB — BASIC METABOLIC PANEL
Anion gap: 7 (ref 5–15)
BUN: 21 mg/dL (ref 6–23)
CO2: 28 mmol/L (ref 19–32)
Calcium: 8.7 mg/dL (ref 8.4–10.5)
Chloride: 105 mmol/L (ref 96–112)
Creatinine, Ser: 1.49 mg/dL — ABNORMAL HIGH (ref 0.50–1.10)
GFR calc Af Amer: 38 mL/min — ABNORMAL LOW (ref >90)
GFR calc non Af Amer: 33 mL/min — ABNORMAL LOW (ref >90)
Glucose, Bld: 202 mg/dL — ABNORMAL HIGH (ref 70–99)
Potassium: 4.8 mmol/L (ref 3.5–5.1)
Sodium: 140 mmol/L (ref 135–145)

## 2014-04-20 LAB — GLUCOSE, CAPILLARY
Glucose-Capillary: 169 mg/dL — ABNORMAL HIGH (ref 70–99)
Glucose-Capillary: 186 mg/dL — ABNORMAL HIGH (ref 70–99)
Glucose-Capillary: 174 mg/dL — ABNORMAL HIGH (ref 70–99)
Glucose-Capillary: 158 mg/dL — ABNORMAL HIGH (ref 70–99)
Glucose-Capillary: 129 mg/dL — ABNORMAL HIGH (ref 70–99)
Glucose-Capillary: 165 mg/dL — ABNORMAL HIGH (ref 70–99)

## 2014-04-20 LAB — SODIUM, URINE, RANDOM: Sodium, Ur: 104 mmol/L

## 2014-04-20 LAB — CREATININE, URINE, RANDOM: Creatinine, Urine: 65.76 mg/dL

## 2014-04-20 MED ORDER — SODIUM CHLORIDE 0.9 % IV SOLN
INTRAVENOUS | Status: AC
  Administered 2014-04-20 – 2014-04-21 (×2): via INTRAVENOUS

## 2014-04-20 NOTE — Progress Notes (Signed)
Utilization review completed.  P.J. Adlee Paar,RN,BSN Case Manager 

## 2014-04-20 NOTE — Progress Notes (Signed)
PROGRESS NOTE    Tara Jackson [id number] DOB: 1940-06-17 DOA: 04/18/2014 PCP: [name], MD  Primary Cardiology: Dr. Glenetta Hew.  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 She denies ever having chest pain during this episode. She was concerned about her dyspnea relating to prior episodes of PE and hence presented to the ED. Back pain improved to 4/10. No further dyspnea. VQ scan negative. Noted new AKI-unclear etiology.  Assessment/Plan:  1. Dyspnea: Resolved. Etiology not clear. Chest x-ray suggestive of atelectasis. VQ scan: Normal. No features suggestive of overt CHF.? Anxiety. 2. Acute kidney injury: Unclear etiology. Creatinine 1.04 (08/05/13). Increase to 1.53. Improving. Brief IV fluid hydration and follow BMP. Creatinine plateaued at 1.4.? Chronic since mid 2015. 3. Subacute on chronic low back pain: Possibly related to spinal stenosis and DJD. Improving. 4. Chronic anemia: Stable 5. Uncontrolled type II DM/IDDM: Place on reduced dose home NPH insulin, NovoLog mealtime and SSI. 6. Chronic diastolic CHF: Compensated 7. Essential hypertension: Controlled. Hold HCTZ & ARB in the context of acute kidney injury. Placed on reduced dose propranolol. 8. 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 VQ scan and lower extremity venous Dopplers negative. Currently on Plavix. DC full dose Lovenox. 9. Bradycardia: Due to beta blocker. Reduced dose. Monitor on telemetry. Stable.   Code Status: Full Family Communication: none at bedside. Disposition Plan: Home possibly  3/14   Consultants:  None  Procedures:  None  Antibiotics:  None   Subjective: Denies complaints.  Objective: Filed Vitals:   04/20/14 0000 04/20/14 0415 04/20/14 0649 04/20/14 0847  BP: 131/41 121/38  118/27  Pulse: 57 100  61  Temp: 98.6 F (37 C) 98.4 F (36.9 C)  98 F (36.7 C)  TempSrc: Oral Oral  Oral  Resp: 20 20    Height:      Weight:  135.988 kg (299 lb 12.8 oz)    SpO2: 94% 95% 100% 96%    Intake/Output Summary (Last 24 hours) at 04/20/14 1437 Last data filed at 04/20/14 0600  Gross per 24 hour  Intake 1561.83 ml  Output      0 ml  Net 1561.83 ml   Filed Weights   04/19/14 0306 04/19/14 0500 04/20/14 0415  Weight: 134.219 kg (295 lb 14.4 oz) 133.993 kg (295 lb 6.4 oz) 135.988 kg (299 lb 12.8 oz)     Exam:  General exam: Moderately built and morbidly obese female sitting up comfortably at edge of bed. Respiratory system: Clear. No increased work of breathing. Cardiovascular system: S1 & S2 heard, RRR. No JVD, murmurs, gallops, clicks. Trace ankle edema. Telemetry: SB in 50's-SR. Occ SB in 40's. Gastrointestinal system: Abdomen is nondistended, soft and nontender. Normal bowel sounds heard. Central nervous system: Alert and oriented. No focal neurological deficits. Extremities: Symmetric 5 x 5 power.   Data Reviewed: Basic Metabolic Panel:  Recent Labs Lab 04/18/14 2035 04/19/14 0900 04/20/14 0444  NA 140 142 140  K 4.7 4.3 4.8  CL 105 106 105  CO2 28 27 28   GLUCOSE 230* 164* 202*  BUN 20 20 21   CREATININE 1.53* 1.44* 1.49*  CALCIUM 9.3 9.1 8.7   Liver Function Tests:  Recent Labs Lab  04/18/14 2035  AST 16  ALT 10  ALKPHOS 81  BILITOT 0.5  PROT 6.6  ALBUMIN 3.3*   No results for input(s): LIPASE, AMYLASE in the last 168 hours. No results for input(s): AMMONIA in the last 168 hours. CBC:  Recent Labs Lab 04/18/14 2035 04/19/14 0900  WBC 6.2 6.5  NEUTROABS 3.6  --   HGB 10.8* 11.0*  HCT 33.2* 34.0*  MCV 94.9 96.6   PLT 238 221   Cardiac Enzymes:  Recent Labs Lab 04/19/14 0244 04/19/14 0900 04/19/14 1447  TROPONINI <0.03 <0.03 <0.03   BNP (last 3 results) No results for input(s): PROBNP in the last 8760 hours. CBG:  Recent Labs Lab 04/19/14 2039 04/20/14 0009 04/20/14 0313 04/20/14 0743 04/20/14 1211  GLUCAP 171* 186* 169* 174* 158*    No results found for this or any previous visit (from the past 240 hour(s)).       Studies: Dg Chest 2 View  04/19/2014   CLINICAL DATA:  Strange feeling in both legs since yesterday, with right flank pain. Shortness of breath and lower extremity swelling. Initial encounter.  EXAM: CHEST  2 VIEW  COMPARISON:  Chest radiograph performed 05/11/2013, and CT of the chest performed 06/05/2008  FINDINGS: The lungs are to be well aerated. Mild right base opacity likely reflects atelectasis. There is no evidence of pleural effusion or pneumothorax.  The heart is mildly enlarged. No acute osseous abnormalities are seen.  IMPRESSION: Mild right basilar opacity likely reflects atelectasis. Mild cardiomegaly noted.   Electronically Signed   By: Garald Balding M.D.   On: 04/19/2014 02:13   US Renal  04/20/2014   CLINICAL DATA:  Acute kidney injury, history asthma, hypertension, type 2 diabetes, hyperlipidemia  EXAM: RENAL/URINARY TRACT ULTRASOUND COMPLETE  COMPARISON:  CT abdomen and pelvis 08/05/2013  FINDINGS: Right Kidney:  Length: 11.6 cm. Normal cortical thickness and echogenicity. No mass, hydronephrosis or shadowing calcification.  Left Kidney:  Length: 11.5 cm. Cortical thinning. Normal cortical echogenicity. No mass, hydronephrosis or shadowing calcification.  Bladder:  Normal appearance  IMPRESSION: No evidence of renal mass or urinary obstruction.   Electronically Signed   By: Lavonia Dana M.D.   On: 04/20/2014 10:07   Nm Pulmonary Perf And Vent  04/19/2014   CLINICAL DATA:  Chest and back pain  EXAM: NUCLEAR MEDICINE VENTILATION - PERFUSION LUNG SCAN   TECHNIQUE: Ventilation images were obtained in multiple projections using inhaled aerosol technetium 99 M DTPA. Perfusion images were obtained in multiple projections after intravenous injection of Tc-42m MAA.  RADIOPHARMACEUTICALS:  40.0 mCi Tc-62m DTPA aerosol and 6.0 mCi Tc-72m MAA  COMPARISON:  Chest radiograph from 04/19/2014  FINDINGS: Ventilation: No focal ventilation defect.  Perfusion: No wedge shaped peripheral perfusion defects to suggest acute pulmonary embolism.  IMPRESSION: 1. Normal study.  No evidence for pulmonary embolus.   Electronically Signed   By: Kerby Moors M.D.   On: 04/19/2014 13:29        Scheduled Meds: . busPIRone  7.5 mg Oral q morning - 10a  . clonazePAM  0.5 mg Oral QHS  . clonazePAM  1 mg Oral Daily  . clopidogrel  75 mg Oral q morning - 10a  . enoxaparin (LOVENOX) injection  70 mg Subcutaneous Q24H  . insulin aspart  0-5 Units Subcutaneous QHS  . insulin aspart  0-9 Units Subcutaneous TID WC  . insulin aspart  4 Units Subcutaneous TID WC  . insulin NPH Human  15 Units Subcutaneous QAC breakfast  .  levothyroxine  100 mcg Oral QAC breakfast  . lidocaine  3 patch Transdermal Q24H  . Linaclotide  290 mcg Oral q morning - 10a  . nystatin  5 mL Oral Daily  . propranolol  40 mg Oral q morning - 10a  . rosuvastatin  20 mg Oral q morning - 10a  . sodium chloride  3 mL Intravenous Q12H  . sucralfate  1-2 g Oral Daily   Continuous Infusions: . sodium chloride 100 mL/hr at 04/20/14 [id number]    Principal Problem:   Pleuritic chest pain Active Problems:   Hyperlipidemia with target LDL less than 130   Essential hypertension   Intrinsic asthma   Diastolic dysfunction, left ventricle   Chest pain   Diabetes mellitus   Edema of both legs   SOB (shortness of breath)    Time spent: 30 minutes.    Vernell Leep, MD, FACP, FHM. [facility] Pager [phone]  If 7PM-7AM, please contact night-coverage [email or web address] Password [facility] 04/20/2014, 2:37 PM

## 2014-04-21 DIAGNOSIS — N179 Acute kidney failure, unspecified: Secondary | ICD-10-CM | POA: Insufficient documentation

## 2014-04-21 LAB — GLUCOSE, CAPILLARY
Glucose-Capillary: 153 mg/dL — ABNORMAL HIGH (ref 70–99)
Glucose-Capillary: 175 mg/dL — ABNORMAL HIGH (ref 70–99)
Glucose-Capillary: 162 mg/dL — ABNORMAL HIGH (ref 70–99)

## 2014-04-21 LAB — BASIC METABOLIC PANEL
Anion gap: 7 (ref 5–15)
BUN: 18 mg/dL (ref 6–23)
CO2: 27 mmol/L (ref 19–32)
Calcium: 8.3 mg/dL — ABNORMAL LOW (ref 8.4–10.5)
Chloride: 106 mmol/L (ref 96–112)
Creatinine, Ser: 1.35 mg/dL — ABNORMAL HIGH (ref 0.50–1.10)
GFR calc Af Amer: 43 mL/min — ABNORMAL LOW (ref >90)
GFR calc non Af Amer: 37 mL/min — ABNORMAL LOW (ref >90)
Glucose, Bld: 166 mg/dL — ABNORMAL HIGH (ref 70–99)
Potassium: 3.9 mmol/L (ref 3.5–5.1)
Sodium: 140 mmol/L (ref 135–145)

## 2014-04-21 MED ORDER — PROPRANOLOL HCL 40 MG PO TABS: 40.0000 mg | ORAL_TABLET | Freq: Every morning | ORAL | Status: DC

## 2014-04-21 MED ORDER — ENOXAPARIN SODIUM 60 MG/0.6ML ~~LOC~~ SOLN: 60.0000 mg | SUBCUTANEOUS | Status: DC

## 2014-04-21 MED ORDER — INSULIN REGULAR HUMAN 100 UNIT/ML IJ SOLN: 5.0000 [IU] | Freq: Three times a day (TID) | INTRAMUSCULAR | Status: DC

## 2014-04-21 NOTE — Progress Notes (Signed)
Pt discharged home with spouse.  Reviewed discharge instructions and education, all questions answered.  Assessment unchanged from earlier.

## 2014-04-21 NOTE — Care Management Note (Signed)
    Page 1 of 1   04/21/2014     10:10:10 AM CARE MANAGEMENT NOTE 04/21/2014  Patient:  Tara Jackson, Tara Jackson   Account Number:  [id number]  Date Initiated:  04/21/2014  Documentation initiated by:  Whitman Hero  Subjective/Objective Assessment:   PTA from home with husband admitted with pleuritic chest.     Action/Plan:   Return to home when medically stable.   Anticipated DC Date:  04/21/2014   Anticipated DC Plan:  [place]  CM consult      [facility] Choice  HOME HEALTH   Choice offered to / List presented to:  C-1 Patient        [place] arranged  [facility]      [place].   Status of service:  Completed, signed off Medicare Important Message given?  NO (If response is "NO", the following Medicare IM given date fields will be blank) Date Medicare IM given:   Medicare IM given by:   Date Additional Medicare IM given:   Additional Medicare IM given by:    Discharge Disposition:  [place]  Per UR Regulation:  Reviewed for med. necessity/level of care/duration of stay  If discussed at [place] of Stay Meetings, dates discussed:    Comments:  04/20/2012 @ [place] RN,BSN. CM CM spoke with pt regarding HHPT/ VESTIBULAR THERAPY. Pt agreed to receive therapy. Stated has used [facility]. services in the past and would  like to use [facility]. services for HHPT/VESTIBULAR THERAPY.Referral  made with Butch Penny @ [facility] SOC to begin  in 24-48 hrs.  No DME identified , pt stated has walker, hoover round  and wheelchair. Pt with concerns of high copay on meds, medicare low income subsidy form given to pt. No other needs noted per CM.

## 2014-04-21 NOTE — Discharge Summary (Signed)
Physician Discharge Summary  Tara Jackson [id number] DOB: 1940-02-21 DOA: 04/18/2014  PCP: [name], MD  Admit date: 04/18/2014 Discharge date: 04/21/2014  Time spent: Greater than 30 minutes  Recommendations for Outpatient Follow-up:  1. Dr. Shon Baton, PCP in 3-4 days with repeat labs (CBC & BMP). Will need adjustment of diabetes and HTN medications. 2. Home health PT for vestibular treatment  Discharge Diagnoses:  Principal Problem:   Pleuritic chest pain Active Problems:   Hyperlipidemia with target LDL less than 130   Essential hypertension   Intrinsic asthma   Diastolic dysfunction, left ventricle   Chest pain   Diabetes mellitus   Edema of both legs   SOB (shortness of breath)   Discharge Condition: Improved & Stable  Diet recommendation: Heart healthy and diabetic diet.  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 She denies ever having chest pain during this episode. She was concerned about her dyspnea relating to prior episodes of PE and hence presented to the ED. Back pain improved to 4/10. No further dyspnea. VQ scan negative. Noted new AKI-unclear etiology.  Hospital Course:   1. Dyspnea: Resolved. Etiology not clear. Chest x-ray suggestive of atelectasis. VQ scan: Normal. No features suggestive of overt CHF.? Anxiety. 2. Acute kidney injury: Unclear etiology. Creatinine 1.04 (08/05/13). Increase to 1.53. Hydrated with brief IVF. Creatinine improved to 1.35 (  possible CKD). Hold ARB & HCTZ until OP follow up with repeat BMP. 3. Subacute on chronic low back pain: Possibly related to spinal stenosis and DJD. No further complaints. 4. Chronic anemia: Stable 5. Uncontrolled type II DM/IDDM: Place on reduced dose home NPH insulin, NovoLog mealtime and SSI. DC on reduced insulin doses and no PO meds. These can be adjusted during OP follow up. 6. Chronic diastolic CHF: Compensated 7. Essential hypertension: Controlled. Hold HCTZ & ARB in the context of recent acute kidney injury. Placed on reduced dose propranolol. 8. 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 VQ scan and lower extremity venous Dopplers negative. Currently on Plavix.  9. Bradycardia: Due to beta blocker. Reduced dose Propranolol from 60 mg to 40 mg daily. Improved & Stable.  Consultations:  None  Procedures:  None    Discharge Exam:  Complaints:  Denies complaints and anxious to go home.  Filed Vitals:   04/20/14 2000 04/21/14 0036 04/21/14 0338 04/21/14 0745  BP: 120/48 110/42 154/59 148/66  Pulse: 50 52 64 60  Temp: 98.5 F (36.9 C) 98.4 F (36.9 C) 98.1 F (36.7 C) 97.7 F (36.5 C)  TempSrc: Oral Oral Oral Oral  Resp: 20 19 18 15   Height:      Weight:   125.283 kg (276 lb 3.2 oz)   SpO2: 94% 93% 93% 100%    General exam: Moderately built and morbidly obese female sitting up comfortably at edge of bed. Respiratory system: Clear. No increased work of breathing. Cardiovascular system: S1 & S2 heard, RRR. No JVD, murmurs, gallops, clicks. Trace ankle edema. Telemetry: SB in  50's-SR.  Gastrointestinal system: Abdomen is nondistended, soft and nontender. Normal bowel sounds heard. Central nervous system: Alert and oriented. No focal neurological deficits. Extremities: Symmetric 5 x 5 power.  Discharge Instructions      Discharge Instructions    Call MD for:  difficulty breathing, headache or visual disturbances    Complete by:  As directed      Call MD  for:  extreme fatigue    Complete by:  As directed      Call MD for:  persistant dizziness or light-headedness    Complete by:  As directed      Call MD for:  severe uncontrolled pain    Complete by:  As directed      Diet - low sodium heart healthy    Complete by:  As directed      Diet Carb Modified    Complete by:  As directed      Increase activity slowly    Complete by:  As directed             Medication List    STOP taking these medications        glipiZIDE 10 MG tablet  Commonly known as:  GLUCOTROL     hydrochlorothiazide 12.5 MG capsule  Commonly known as:  MICROZIDE     ibuprofen 200 MG tablet  Commonly known as:  ADVIL,MOTRIN     methscopolamine 5 MG tablet  Commonly known as:  PAMINE FORTE     PHAZYME 180 MG Caps  Generic drug:  Simethicone     primidone 50 MG tablet  Commonly known as:  MYSOLINE     PROBIOTIC PO     TERCONAZOLE VA     valsartan 320 MG tablet  Commonly known as:  DIOVAN      TAKE these medications        albuterol 108 (90 BASE) MCG/ACT inhaler  Commonly known as:  PROVENTIL HFA;VENTOLIN HFA  Inhale 2 puffs into the lungs every 6 (six) hours as needed for wheezing.     busPIRone 7.5 MG tablet  Commonly known as:  BUSPAR  Take 7.5 mg by mouth every morning.     clonazePAM 0.5 MG tablet  Commonly known as:  KLONOPIN  Take 0.5-1 mg by mouth 2 (two) times daily. 1 mg (2 tablets) in the morning and 0.5 mg (1 tablet) at night     clopidogrel 75 MG tablet  Commonly known as:  PLAVIX  Take 75 mg by mouth every morning.     dicyclomine 10 MG capsule  Commonly known as:  BENTYL  Take 10 mg by mouth 3 (three) times daily as needed for spasms.     esomeprazole 40 MG capsule  Commonly known as:  NEXIUM  Take 40 mg by mouth daily before breakfast.     EX-LAX PO  Take 1 tablet by mouth daily as needed (constipation).     insulin NPH Human 100 UNIT/ML injection  Commonly known as:  HUMULIN N,NOVOLIN N  Inject 30 Units into the  skin daily before breakfast. Novolin-N     insulin regular 100 units/mL injection  Commonly known as:  NOVOLIN R,HUMULIN R  Inject 0.05 mLs (5 Units total) into the skin 3 (three) times daily before meals.     levothyroxine 100 MCG tablet  Commonly known as:  SYNTHROID, LEVOTHROID  Take 100 mcg by mouth every morning.     lidocaine 5 %  Commonly known as:  LIDODERM  Place 3 patches onto the skin daily. Remove & Discard patch within 12 hours or as directed by MD     LINZESS 290 MCG Caps capsule  Generic drug:  Linaclotide  Take 290 mcg by mouth every morning.     magnesium citrate Soln  Take 1 Bottle by mouth once as needed for mild constipation or severe constipation.     meclizine 12.5 MG tablet  Commonly known as:  ANTIVERT  Take 12.5 mg by mouth 3 (three) times daily as needed for dizziness.     nystatin 100000 UNIT/ML suspension  Commonly known as:  MYCOSTATIN  Take 5 mLs by mouth daily.     polyethylene glycol packet  Commonly known as:  MIRALAX / GLYCOLAX  Take 17 g by mouth daily as needed for mild constipation.     potassium chloride 10 MEQ CR capsule  Commonly known as:  MICRO-K  Take 10 mEq by mouth daily as needed (leg cramps).     propranolol 40 MG tablet  Commonly known as:  INDERAL  Take 1 tablet (40 mg total) by mouth every morning.     rosuvastatin 20 MG tablet  Commonly known as:  CRESTOR  Take 20 mg by mouth every morning.     sucralfate 1 G tablet  Commonly known as:  CARAFATE  Take 1-2 g by mouth daily.     traMADol-acetaminophen 37.5-325 MG per tablet  Commonly known as:  ULTRACET  Take 1-2 tablets by mouth every 6 (six) hours as needed for moderate pain. Take 1 to 2  Tablets every 6 to 8 hrs as needed for pain     VITAMIN D-3 PO  Take 1 tablet by mouth every morning.       Follow-up Information    Follow up with [facility].   Why:  HHPT/VESTISBULAR THERAPY ARRANGED   Contact information:   [address] [place] [address] [phone]       Follow up with [name], MD. Schedule an appointment as soon as possible for a visit in 3 days.   Specialty:  Internal Medicine   Why:  To be seen with repeat labs (CBC & BMP). Will need adjustment of diabetes and blood pressure medications.   Contact information:   [address] [place] [address] [phone]        The results of significant diagnostics from this hospitalization (including imaging, microbiology, ancillary and laboratory) are listed below for reference.    Significant Diagnostic Studies: Dg Chest 2 View  04/19/2014   CLINICAL DATA:  Strange feeling in both legs since yesterday, with right flank pain. Shortness of breath and lower extremity swelling. Initial encounter.  EXAM: CHEST  2 VIEW  COMPARISON:  Chest radiograph performed 05/11/2013, and CT of the chest performed 06/05/2008  FINDINGS: The lungs are to be well aerated. Mild right base opacity likely reflects atelectasis. There is no evidence of pleural effusion or pneumothorax.  The heart is mildly enlarged. No acute osseous abnormalities are seen.  IMPRESSION: Mild right basilar opacity likely reflects atelectasis. Mild cardiomegaly noted.   Electronically Signed   By: Garald Balding M.D.   On: 04/19/2014 02:13   US Renal  04/20/2014   CLINICAL DATA:  Acute kidney injury, history asthma, hypertension, type 2 diabetes, hyperlipidemia  EXAM: RENAL/URINARY TRACT ULTRASOUND COMPLETE  COMPARISON:  CT abdomen and pelvis 08/05/2013  FINDINGS: Right Kidney:  Length: 11.6 cm. Normal cortical thickness and echogenicity. No mass, hydronephrosis or  shadowing calcification.  Left Kidney:  Length: 11.5 cm. Cortical thinning. Normal cortical echogenicity. No mass, hydronephrosis or shadowing calcification.  Bladder:  Normal appearance  IMPRESSION: No evidence of renal mass or urinary obstruction.   Electronically Signed   By: Lavonia Dana M.D.   On: 04/20/2014 10:07   Nm  Pulmonary Perf And Vent  04/19/2014   CLINICAL DATA:  Chest and back pain  EXAM: NUCLEAR MEDICINE VENTILATION - PERFUSION LUNG SCAN  TECHNIQUE: Ventilation images were obtained in multiple projections using inhaled aerosol technetium 99 M DTPA. Perfusion images were obtained in multiple projections after intravenous injection of Tc-11m MAA.  RADIOPHARMACEUTICALS:  40.0 mCi Tc-12m DTPA aerosol and 6.0 mCi Tc-28m MAA  COMPARISON:  Chest radiograph from 04/19/2014  FINDINGS: Ventilation: No focal ventilation defect.  Perfusion: No wedge shaped peripheral perfusion defects to suggest acute pulmonary embolism.  IMPRESSION: 1. Normal study.  No evidence for pulmonary embolus.   Electronically Signed   By: Kerby Moors M.D.   On: 04/19/2014 13:29    Microbiology: No results found for this or any previous visit (from the past 240 hour(s)).   Labs: Basic Metabolic Panel:  Recent Labs Lab 04/18/14 2035 04/19/14 0900 04/20/14 0444 04/21/14 0440  NA 140 142 140 140  K 4.7 4.3 4.8 3.9  CL 105 106 105 106  CO2 28 27 28 27   GLUCOSE 230* 164* 202* 166*  BUN 20 20 21 18   CREATININE 1.53* 1.44* 1.49* 1.35*  CALCIUM 9.3 9.1 8.7 8.3*   Liver Function Tests:  Recent Labs Lab 04/18/14 2035  AST 16  ALT 10  ALKPHOS 81  BILITOT 0.5  PROT 6.6  ALBUMIN 3.3*   No results for input(s): LIPASE, AMYLASE in the last 168 hours. No results for input(s): AMMONIA in the last 168 hours. CBC:  Recent Labs Lab 04/18/14 2035 04/19/14 0900  WBC 6.2 6.5  NEUTROABS 3.6  --   HGB 10.8* 11.0*  HCT 33.2* 34.0*  MCV 94.9 96.6  PLT 238 221   Cardiac Enzymes:  Recent Labs Lab 04/19/14 0244 04/19/14 0900 04/19/14 1447  TROPONINI <0.03 <0.03 <0.03   BNP: BNP (last 3 results)  Recent Labs  04/18/14 2319 04/19/14 1447  BNP 120.6* 74.6    ProBNP (last 3 results) No results for input(s): PROBNP in the last 8760 hours.  CBG:  Recent Labs Lab 04/20/14 1641 04/20/14 2003 04/21/14 0035  04/21/14 0342 04/21/14 0747  GLUCAP 129* 165* 153* 175* 162*        Signed:  Vernell Leep, MD, FACP, FHM. [facility] Pager [phone]  If 7PM-7AM, please contact night-coverage [email or web address] Password [facility] 04/21/2014, 10:46 AM

## 2014-06-03 ENCOUNTER — Encounter: Payer: Self-pay | Admitting: Neurology

## 2014-06-03 ENCOUNTER — Ambulatory Visit (INDEPENDENT_AMBULATORY_CARE_PROVIDER_SITE_OTHER): Payer: Medicare HMO | Admitting: Neurology

## 2014-06-03 VITALS — BP 126/66 | HR 78 | Wt 276.4 lb

## 2014-06-03 DIAGNOSIS — R413 Other amnesia: Secondary | ICD-10-CM | POA: Diagnosis not present

## 2014-06-03 HISTORY — DX: Other amnesia: R41.3

## 2014-06-03 MED ORDER — BUSPIRONE HCL 10 MG PO TABS: 10.0000 mg | ORAL_TABLET | Freq: Every morning | ORAL | Status: AC

## 2014-06-03 NOTE — Progress Notes (Signed)
Tara Jackson: Tara Jackson DOB: 1940-12-29  REASON FOR VISIT: routine follow up for headaches, tia HISTORY FROM: Tara Jackson  HISTORY OF PRESENT ILLNESS: Tara Jackson is a 74 year old African lady who developed new-onset chronic daily headaches for the last 4 weeks. She states that she had some dental extractions done of her upper molar teeth about a week ago following which she noticed worsening of her pain which predominantly is in the temporal head regions but does spread all over. She states the pain is constant, severe. At times it is pulsatile. She was seen in the emergency room at [facility] on 07/23/12. She was treated with IV Dilaudid, Phenergan, Zofran and Benadryl in no hopping on the subtotal benefit. CT scan of the maxillary region showed no evidence of abscess associated with a total extraction but showed postoperative changes with communication between the oral cavity and nasal maxillary sinus. There was evidence of sinusitis seen. CT scan of the head showed no acute abnormality. The headache is accompanied by some nausea and light sensitivity and occasional vomiting. The headache remains unchanged throughout the day. She is currently being treated with antibiotic course for a swollen gums. She has tried oxycodone, Ultram and Tylenol which have not helped. She does also have history of degenerative cervical spine disease she does complain of neck pain and muscle tightness and spasm as well. She has had 4 minor falls since her last visit 2 months ago. She even had a brief episode of blacking out yesterday but her husband caught her. She found that her blood pressure was low and her primary care physician has reduced her blood pressure medication since then. She denies any prior history of migraine headaches of similar headaches in the past. She denies any blurred vision or loss of vision accompanying her headaches. 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   UPDATE 11/15/12 (LL): Tara Jackson comes to office for revisit for daily headache after wisdom tooth extraction. MRI cervical spine shows prominent spondylitic changes mostly at C5-6 and C4-5. with lateral disc osteophyte protrusions resulting in moderate foraminal narrowing on the right and mild canal stenosis without definite compression. MRI brain showed mild changes of microvascular ischemia. Advanced changes of chronic paranasal sinusitis. She states that her headaches are much better, about 84 % better than last visit. She does not have a headache today. She is using Tizanadine 4 mg for neck muscle tension. She had a recent sleep study which showed snoring but AHI within normal limits. Restless legs syndrome was suggested due to frequent leg movements. She states at night sometimes she feels like something is crawling on her lower legs and she feels like she must move them.   UPDATE 09/19/13 (LL): Since last visit Tara Jackson states that she has had some problems with abdominal pain and constipation. She recently went to the ER with abdominal pain and constipation times one week, despite the use of several enemas. Her headaches have not been bad in the last few months. She had a epidural pain injection in May at pain management. She denies any focal TIA or stroke symptoms, but has numerous symptom complaints on review of systems. She is concerned about leg swelling and abdominal swelling, although her weight is up another 21 pounds since last visit. She states she has been having frequent falls without injury, but when she  falls it's difficult for her to get up by herself and even difficult for her husband to get her up. She does not exercise and uses a motorized wheelchair his most of the time. Of particular concern today is an increase in hand tremor right greater than left, which is present at rest but more intense with action. It is  causing her embarrassment. Update 06/03/2014 : She returns for follow-up after last visit 6 months ago. She is accompanied by husband. Tara Jackson reports new complaint of memory difficulties for the last 3 months. She states that this is mainly short-term memory. She cannot remember recent events and conversations and needs to be reminded multiple times. She often asked the same questions when she was told to answer a short while back. At times she feels that she has trouble completing sentences and can stop in mid sentence and search for what she was trying to say. She has had some episodes of hypoglycemia but feels her memory difficulties and are not related to that. She was prescribed primidone at last visit for tremor but she did not fill the prescription but she remains on Inderal 40 mg which seems to help her tremor quite well. She was hospitalized about 4-6 weeks ago with chest pain and all workup was negative except heart rate was slow. She has long-standing history of depression and does take BuSpar 7.5 mg daily as well as Klonopin but feels her depression is not adequately treated. She has had no recurrent stroke or TIA symptoms. She remains on clopidogrel which is tolerating well without significant bleeding or bruising. She states her blood pressure and cholesterol control have been good. ROS:  14 system review of systems is positive for . Appetite change, fatigue, trouble swallowing, cough, wheezing, shortness of breath, eye redness, chest pain, leg swelling, murmur, excessive thirst, swollen abdomen, nausea, vomiting, rectal pain, frequent waking, snoring, sleep talking, food allergies, incontinence of bladder, joint swelling, back pain, walking difficulty, neck pain, itching, bruising, memory loss, headache, depression, anxiety  ALLERGIES: Allergies  Allergen Reactions  . Iodine Other (See Comments)    ONLY IV--Makes unconscious  . Metoclopramide Hcl Other (See Comments)    suicidal  . Sulfa  Antibiotics Hives  . Iohexol      Code: SOB, Desc: cardiac arrest w/ iv contrast, has never used 13 hr prep//alice calhoun, Onset Date: [id number]   . Lactose Intolerance (Gi) Diarrhea  . Lansoprazole Nausea And Vomiting    HOME MEDICATIONS: Outpatient Prescriptions Prior to Visit  Medication Sig Dispense Refill  . albuterol (PROVENTIL HFA;VENTOLIN HFA) 108 (90 BASE) MCG/ACT inhaler Inhale 2 puffs into the lungs every 6 (six) hours as needed for wheezing.    . Cholecalciferol (VITAMIN D-3 PO) Take 1 tablet by mouth every morning.     . clonazePAM (KLONOPIN) 0.5 MG tablet Take 0.5-1 mg by mouth 2 (two) times daily. 1 mg (2 tablets) in the morning and 0.5 mg (1 tablet) at night    . clopidogrel (PLAVIX) 75 MG tablet Take 75 mg by mouth every morning.    . dicyclomine (BENTYL) 10 MG capsule Take 10 mg by mouth 3 (three) times daily as needed for spasms.     Marland Kitchen esomeprazole (NEXIUM) 40 MG capsule Take 40 mg by mouth daily before breakfast.    . insulin NPH (HUMULIN N,NOVOLIN N) 100 UNIT/ML injection Inject 30 Units into the skin daily before breakfast. Novolin-N    . insulin regular (NOVOLIN R,HUMULIN R) 100 units/mL injection Inject 0.05 mLs (  5 Units total) into the skin 3 (three) times daily before meals.    Marland Kitchen levothyroxine (SYNTHROID, LEVOTHROID) 100 MCG tablet Take 100 mcg by mouth every morning.     . lidocaine (LIDODERM) 5 % Place 3 patches onto the skin daily. Remove & Discard patch within 12 hours or as directed by MD    . Linaclotide (LINZESS) 290 MCG CAPS capsule Take 290 mcg by mouth every morning.     . meclizine (ANTIVERT) 12.5 MG tablet Take 12.5 mg by mouth 3 (three) times daily as needed for dizziness.     . nystatin (MYCOSTATIN) 100000 UNIT/ML suspension Take 5 mLs by mouth daily.     . polyethylene glycol (MIRALAX / GLYCOLAX) packet Take 17 g by mouth daily as needed for mild constipation.    . potassium chloride (MICRO-K) 10 MEQ CR capsule Take 10 mEq by mouth daily as needed  (leg cramps).     . propranolol (INDERAL) 40 MG tablet Take 1 tablet (40 mg total) by mouth every morning. 30 tablet 0  . rosuvastatin (CRESTOR) 20 MG tablet Take 20 mg by mouth every morning.     . Sennosides (EX-LAX PO) Take 1 tablet by mouth daily as needed (constipation).     . sucralfate (CARAFATE) 1 G tablet Take 1-2 g by mouth daily.     . busPIRone (BUSPAR) 7.5 MG tablet Take 7.5 mg by mouth every morning.     . magnesium citrate SOLN Take 1 Bottle by mouth once as needed for mild constipation or severe constipation.    . traMADol-acetaminophen (ULTRACET) 37.5-325 MG per tablet Take 1-2 tablets by mouth every 6 (six) hours as needed for moderate pain. Take 1 to 2  Tablets every 6 to 8 hrs as needed for pain     No facility-administered medications prior to visit.    PHYSICAL EXAM Filed Vitals:   06/03/14 1500  BP: 126/66  Pulse: 78  Weight: 276 lb 6.4 oz (125.374 kg)   Body mass index is 42.63 kg/(m^2).  Physical Exam  General: Morbidly Obese elderly African American lady in no distress, seated  Head: head normocephalic and atraumatic. Orohparynx benign  Neck: supple with no carotid or supraclavicular bruits  Cardiovascular: regular rate and rhythm, no murmurs  Musculoskeletal: no deformity  Skin: no rash/petichiae  Vascular: Normal pulses all extremities   Neurologic Exam  Mental Status: Awake and fully alert. Oriented to place and time. Recent   remote diminished but remote memory intact. Attention span, concentration and fund of knowledge diminished. Mood and affect appropriate. Mini-Mental status exam scored 25/30 with deficits in orientation and recall. Animal naming test done. Clock 4/4. Geriatric depression scale 10 suggestive of moderate depression. Cranial Nerves: Pupils equal, briskly reactive to light. Extraocular movements full without nystagmus. Visual fields full to confrontation. Hearing intact. Facial sensation intact. Face, tongue, palate moves normally and  symmetrically.  Motor: Normal bulk and tone. Normal strength in all tested extremity muscles. Mild to moderate resting tremor of the right hand. Tremor amplifies with movement. Sensory: intact to light touch in all 4 extremities Coordination: Finger-to-nose and heel-to-shin performed accurately bilaterally.  Gait and Station: not tested; in wheelchair.  Reflexes: 1+ and symmetric.  ASSESSMENT: 74 y.o. lady with history of mixed vascular and tension headaches. Prior history of left brain TIA in March 2014 with vascular risk factors of sleep apnea, diabetes, hypertension, hyperlipidemia, obesity. Resting and action tremor of the right hand much improved on Inderal. 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  PLAN:  I had a long discussion with the Tara Jackson and husband regarding her new complaints of short-term memory difficulties over the last 3 months. This likely represents mild cognitive impairment but it may mostly be due to suboptimally treated depression. Plan to increase BuSpar to 10 mg daily and also check lab work for treatable causes of memory loss, EEG and MRI scan continue clopidogrel for secondary stroke prevention and maintain strict control of hypertension with blood pressure goal below 140/90, diabetes with hemoglobin A1c goal below 6.5% and lipids with LDL cholesterol goal below 70 mg percent. Return for follow-up in 3 months or call earlier if necessary..   Return in about 3 months (around 09/02/2014).  Antony Contras, MD  06/03/2014, 5:11 PM [facility] [address] [place], [place] [address] [phone]  Note: This document was prepared with digital dictation and possible smart phrase technology. Any transcriptional errors that result from this process are unintentional.

## 2014-06-03 NOTE — Patient Instructions (Signed)
I had a long discussion with the patient and husband regarding her new complaints of short-term memory difficulties over the last 3 months. This likely represents mild cognitive impairment but it may mostly be due to suboptimally treated depression. Plan to increase BuSpar to 10 mg daily and also check lab work for treatable causes of memory loss, EEG and MRI scan continue clopidogrel for secondary stroke prevention and maintain strict control of hypertension with blood pressure goal below 140/90, diabetes with hemoglobin A1c goal below 6.5% and lipids with LDL cholesterol goal below 70 mg percent. Return for follow-up in 3 months or call earlier if necessary.. Management of Memory Problems  There are some general things you can do to help manage your memory problems.  Your memory may not in fact recover, but by using techniques and strategies you will be able to manage your memory difficulties better.  1)  Establish a routine.  Try to establish and then stick to a regular routine.  By doing this, you will get used to what to expect and you will reduce the need to rely on your memory.  Also, try to do things at the same time of day, such as taking your medication or checking your calendar first thing in the morning.  Think about think that you can do as a part of a regular routine and make a list.  Then enter them into a daily planner to remind you.  This will help you establish a routine.  2)  Organize your environment.  Organize your environment so that it is uncluttered.  Decrease visual stimulation.  Place everyday items such as keys or cell phone in the same place every day (ie.  Basket next to front door)  Use post it notes with a brief message to yourself (ie. Turn off light, lock the door)  Use labels to indicate where things go (ie. Which cupboards are for food, dishes, etc.)  Keep a notepad and pen by the telephone to take messages  3)  Memory Aids  A diary or journal/notebook/daily  planner  Making a list (shopping list, chore list, to do list that needs to be done)  Using an alarm as a reminder (kitchen timer or cell phone alarm)  Using cell phone to store information (Notes, Calendar, Reminders)  Calendar/White board placed in a prominent position  Post-it notes  In order for memory aids to be useful, you need to have good habits.  It's no good remembering to make a note in your journal if you don't remember to look in it.  Try setting aside a certain time of day to look in journal.  4)  Improving mood and managing fatigue.  There may be other factors that contribute to memory difficulties.  Factors, such as anxiety, depression and tiredness can affect memory.  Regular gentle exercise can help improve your mood and give you more energy.  Simple relaxation techniques may help relieve symptoms of anxiety  Try to get back to completing activities or hobbies you enjoyed doing in the past.  Learn to pace yourself through activities to decrease fatigue.  Find out about some local support groups where you can share experiences with others.  Try and achieve 7-8 hours of sleep at night.

## 2014-06-05 ENCOUNTER — Telehealth: Payer: Self-pay | Admitting: *Deleted

## 2014-06-05 LAB — DEMENTIA PANEL
Homocysteine: 15.3 umol/L — ABNORMAL HIGH (ref 0.0–15.0)
RPR Ser Ql: NONREACTIVE
TSH: 1.58 u[IU]/mL (ref 0.450–4.500)
Vitamin B-12: 329 pg/mL (ref 211–946)

## 2014-06-05 NOTE — Telephone Encounter (Signed)
-----   Message from Garvin Fila, MD sent at 06/05/2014  8:38 AM EDT ----- Inform patient that lab results for reversible causes of memory loss are fine

## 2014-06-05 NOTE — Telephone Encounter (Signed)
Spoke with Mr Wolcott and informed him his wife's lab results for reversible causes of memory loss are fine, per Dr Clydene Fake. Husband verbalized understanding, appreciation.

## 2014-06-19 ENCOUNTER — Other Ambulatory Visit: Payer: Medicare HMO

## 2014-06-26 ENCOUNTER — Ambulatory Visit
Admission: RE | Admit: 2014-06-26 | Discharge: 2014-06-26 | Disposition: A | Discharge status: Home / Self Care | Payer: Medicare HMO | Source: Ambulatory Visit | Attending: Neurology | Admitting: Neurology

## 2014-06-26 DIAGNOSIS — R413 Other amnesia: Secondary | ICD-10-CM

## 2014-06-26 MED ORDER — GADOBENATE DIMEGLUMINE 529 MG/ML IV SOLN
10.0000 mL | Freq: Once | INTRAVENOUS | Status: AC | PRN
  Administered 2014-06-26: 10 mL via INTRAVENOUS

## 2014-07-01 ENCOUNTER — Telehealth: Payer: Self-pay | Admitting: Neurology

## 2014-07-01 NOTE — Telephone Encounter (Signed)
Angie and Edwena Felty can you help in this matter.

## 2014-07-01 NOTE — Telephone Encounter (Signed)
Tara Jackson with [facility] called and stated that the patient would like to know her diagnosis code before she comes in for her appt this week. Tara Jackson stated that she could not be called back but requested that the nurse call the patient back and give her the requested information. Georgina Pillion [phone]).

## 2014-07-01 NOTE — Telephone Encounter (Signed)
Spoke to patient who is requesting diagnosis code for upcoming EEG. She states she does not want to pay for it if she can get [employer] to cover the cost. Informed her that this RN will route her question to [facility] for assistance.   Patient also requests results of 06/26/14 MRI. Informed her that Dr Leonie Man returns to this office tomorrow and will read her results. Informed her that she then will receive a call either from Dr Leonie Man or RN with results. She verbalized understanding.

## 2014-07-02 NOTE — Telephone Encounter (Signed)
Dr Leonie Man,  FYI  Thank you, Verneita Griffes

## 2014-07-02 NOTE — Telephone Encounter (Signed)
Patient called to cancel her EEG. She states that she spoke with someone from her insurance department and stated that the EEG would not be covered. I canceled her EEG appointment.

## 2014-07-02 NOTE — Telephone Encounter (Signed)
Spoke with patient and informed her of Dr Clydene Fake detailed message re: MRI results. She verbalized understanding, appreciation for this call.

## 2014-07-02 NOTE — Telephone Encounter (Signed)
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 No change compared with previous MRI scan from 2014

## 2014-07-02 NOTE — Telephone Encounter (Signed)
I will call the patient to answer her questions. Thanks Angie

## 2014-07-10 ENCOUNTER — Other Ambulatory Visit: Payer: Medicare HMO

## 2014-08-08 ENCOUNTER — Telehealth: Payer: Self-pay | Admitting: Neurology

## 2014-08-08 NOTE — Telephone Encounter (Signed)
I called back.  Relayed providers message.  Patient expressed understanding and was agreeable to this plan.  She will call us back if anything further is needed.

## 2014-08-08 NOTE — Telephone Encounter (Signed)
Patient is calling because she needs a Rx called in for a headache. She has had a bad headache for the past 3 days. Please call Rx to [facility] on [address] @ [address] . Thank you

## 2014-08-08 NOTE — Telephone Encounter (Signed)
Try tramadol 100 mg q  6hrly but if not improving go to ER

## 2014-08-08 NOTE — Telephone Encounter (Signed)
I called back and spoke with the patient.  Says she has had a headache for about 3 days.  States she has taken Tramadol, but it is not effective, and would like to know if something else can be prescribed.  Please advise.  Thank you.

## 2014-09-11 ENCOUNTER — Ambulatory Visit: Payer: Medicare HMO | Admitting: Neurology

## 2014-10-07 ENCOUNTER — Telehealth: Payer: Self-pay | Admitting: *Deleted

## 2014-10-07 ENCOUNTER — Ambulatory Visit (INDEPENDENT_AMBULATORY_CARE_PROVIDER_SITE_OTHER): Payer: Medicare HMO | Admitting: Adult Health

## 2014-10-07 ENCOUNTER — Encounter: Payer: Self-pay | Admitting: Adult Health

## 2014-10-07 ENCOUNTER — Other Ambulatory Visit: Payer: Self-pay | Admitting: Adult Health

## 2014-10-07 VITALS — HR 65 | Ht 67.0 in | Wt 280.0 lb

## 2014-10-07 DIAGNOSIS — R51 Headache: Secondary | ICD-10-CM

## 2014-10-07 DIAGNOSIS — Z8673 Personal history of transient ischemic attack (TIA), and cerebral infarction without residual deficits: Secondary | ICD-10-CM

## 2014-10-07 DIAGNOSIS — G3184 Mild cognitive impairment, so stated: Secondary | ICD-10-CM | POA: Diagnosis not present

## 2014-10-07 DIAGNOSIS — R519 Headache, unspecified: Secondary | ICD-10-CM

## 2014-10-07 MED ORDER — KETOROLAC TROMETHAMINE 30 MG/ML IJ SOLN
30.0000 mg | Freq: Once | INTRAMUSCULAR | Status: AC
  Administered 2014-10-07: 30 mg via INTRAMUSCULAR

## 2014-10-07 MED ORDER — TIZANIDINE HCL 4 MG PO TABS: ORAL_TABLET | ORAL | Status: DC

## 2014-10-07 NOTE — Telephone Encounter (Signed)
Patient was given ketorolac 60mg  IM rather than 30mg  IM in our office at 10:45am.  Her identity was verified, her allergies were verified, stated she had been given the medication in the past without adverse effects and she had a driver with her.  Spoke to both Ward Givens, NP and Dr. Leonie Man concerning dose - both agreed that she was not in harm with the dosage but to call and check on her and educate her on side effects. Patient was spoken to at 11:10 - she was told to eat to prevent nausea and that she may experience drowsiness.  Patient stated she felt fine other than still having somewhat of a headache.  I told her the medication peaks in two hours and we would call back to check on her at that time.  She thanked me for calling to let her know.

## 2014-10-07 NOTE — Patient Instructions (Addendum)
Toradol injection today for headache. Start Tizanidine 4 mg daily at bedtime for 1 week then increase to 1 tablet twice a day thereafter. Stop Tramadol If headache severity does not improve we will try tylenol #3 Memory is stable. Will continue to monitor.  If your symptoms worsen or you develop new symptoms please let us know.

## 2014-10-07 NOTE — Telephone Encounter (Signed)
Noted. Patient's headache have responded well to Toradol. No adverse events noted.

## 2014-10-07 NOTE — Telephone Encounter (Signed)
Spoke to patient - says her headache has eased off.  She rated her pain at 10 on the pain scale prior to her injection and now rates it at 3.  Says she has been at her church dropping off donations but now plans to go home to rest.  She reported no adverse side effects and tells me she is feeling much better.

## 2014-10-07 NOTE — Telephone Encounter (Signed)
Attempted call to patient - unable to reach - left message for a return call.

## 2014-10-07 NOTE — Progress Notes (Signed)
PATIENT: Tara Jackson DOB: 12/10/1940  REASON FOR VISIT: follow up- headache, TIA, memory loss HISTORY FROM: patient  HISTORY OF PRESENT ILLNESS: Tara Jackson is a 74 year old female with a history of headaches, TIA and memory loss. She returns today for follow-up. She reports that today she has a headache. She states that it started approximately 1 month ago and has been daily in nature. She rates her pain a 10 out of 10 today. She describes her discomfort as pulsatile in the temporal region. She does describe a stiff neck. Denies nausea and vomiting. Does have some photophobia. Denies phonophobia. She has tried tramadol 100 mg with no relief. In the past the patient has been on tizanidine (2014) and according to the notes she received good benefit from this medication. The patient does not recall this medication and is unsure why she is no longer taking it-- other than her prescription may have ran out. The patient does feel that her memory is worse. She states that last night she did not remember eating supper. The patient is able to complete most ADLs independently. Although she requires assistance with putting on her shoes and stockings. Her husband also picks out her clothes for her. She no longer does much cooking because she feels dizzy at times. She does not operate a motor vehicle. She continues to do the finances however last month she had some complications however she is going to try again this month. The patient is not currently on any medication for her memory. Her BuSpar was increased at the last visit however the patient is not aware of this. Her husband is with her today at the visit. 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  HISTORY: Tara Jackson is a 74 year old African lady who developed new-onset chronic daily headaches for the last 4 weeks. She states that she had some dental extractions done of her upper molar teeth about a week ago following  which she noticed worsening of her pain which predominantly is in the temporal head regions but does spread all over. She states the pain is constant, severe. At times it is pulsatile. She was seen in the emergency room at [facility] on 07/23/12. She was treated with IV Dilaudid, Phenergan, Zofran and Benadryl in no hopping on the subtotal benefit. CT scan of the maxillary region showed no evidence of abscess associated with a total extraction but showed postoperative changes with communication between the oral cavity and nasal maxillary sinus. There was evidence of sinusitis seen. CT scan of the head showed no acute abnormality. The headache is accompanied by some nausea and light sensitivity and occasional vomiting. The headache remains unchanged throughout the day. She is currently being treated with antibiotic course for a swollen gums. She has tried oxycodone, Ultram and Tylenol which have not helped. She does also have history of degenerative cervical spine disease she does complain of neck pain and muscle tightness and spasm as well. She has had 4 minor falls since her last visit 2 months ago. She even had a brief episode of blacking out yesterday but her husband caught her. She found that her blood pressure was low and her primary care physician has reduced her blood pressure medication since then. She denies any prior history of migraine headaches of similar headaches in the past. She denies any blurred vision or loss of vision accompanying her headaches. 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   UPDATE 11/15/12 (LL): Tara Jackson comes to office for revisit for daily headache after wisdom tooth extraction. MRI cervical spine shows prominent spondylitic changes mostly at C5-6 and C4-5. with lateral disc osteophyte protrusions resulting in moderate foraminal narrowing on the right and mild canal  stenosis without definite compression. MRI brain showed mild changes of microvascular ischemia. Advanced changes of chronic paranasal sinusitis. She states that her headaches are much better, about 84 % better than last visit. She does not have a headache today. She is using Tizanadine 4 mg for neck muscle tension. She had a recent sleep study which showed snoring but AHI within normal limits. Restless legs syndrome was suggested due to frequent leg movements. She states at night sometimes she feels like something is crawling on her lower legs and she feels like she must move them.   UPDATE 09/19/13 (LL): Since last visit Tara Jackson states that she has had some problems with abdominal pain and constipation. She recently went to the ER with abdominal pain and constipation times one week, despite the use of several enemas. Her headaches have not been bad in the last few months. She had a epidural pain injection in May at pain management. She denies any focal TIA or stroke symptoms, but has numerous symptom complaints on review of systems. She is concerned about leg swelling and abdominal swelling, although her weight is up another 21 pounds since last visit. She states she has been having frequent falls without injury, but when she falls it's difficult for her to get up by herself and even difficult for her husband to get her up. She does not exercise and uses a motorized wheelchair his most of the time. Of particular concern today is an increase in hand tremor right greater than left, which is present at rest but more intense with action. It is causing her embarrassment. Update 06/03/2014 : She returns for follow-up after last visit 6 months ago. She is accompanied by husband. Patient reports new complaint of memory difficulties for the last 3 months. She states that this is mainly short-term memory. She cannot remember recent events and conversations and needs to be reminded multiple times. She often asked the same  questions when she was told to answer a short while back. At times she feels that she has trouble completing sentences and can stop in mid sentence and search for what she was trying to say. She has had some episodes of hypoglycemia but feels her memory difficulties and are not related to that. She was prescribed primidone at last visit for tremor but she did not fill the prescription but she remains on Inderal 40 mg which seems to help her tremor quite well. She was hospitalized about 4-6 weeks ago with chest pain and all workup was negative except heart rate was slow. She has long-standing history of depression and does take BuSpar 7.5 mg daily as well as Klonopin but feels her depression is not adequately treated. She has had no recurrent stroke or TIA symptoms. She remains on clopidogrel which is tolerating well without significant bleeding or bruising. She states her blood pressure and cholesterol control have been good  REVIEW OF SYSTEMS: Out of a complete 14 system review of symptoms, the patient complains only of the following symptoms, and all other reviewed systems are negative.  ALLERGIES: Allergies  Allergen Reactions  . Iodine Other (  See Comments)    ONLY IV--Makes unconscious  . Metoclopramide Hcl Other (See Comments)    suicidal  . Sulfa Antibiotics Hives  . Iohexol      Code: SOB, Desc: cardiac arrest w/ iv contrast, has never used 13 hr prep//alice calhoun, Onset Date: [id number]   . Lactose Intolerance (Gi) Diarrhea  . Lansoprazole Nausea And Vomiting    HOME MEDICATIONS: Outpatient Prescriptions Prior to Visit  Medication Sig Dispense Refill  . albuterol (PROVENTIL HFA;VENTOLIN HFA) 108 (90 BASE) MCG/ACT inhaler Inhale 2 puffs into the lungs every 6 (six) hours as needed for wheezing.    . busPIRone (BUSPAR) 10 MG tablet Take 1 tablet (10 mg total) by mouth every morning. 60 tablet 1  . Cholecalciferol (VITAMIN D-3 PO) Take 1 tablet by mouth every morning.     . clonazePAM  (KLONOPIN) 0.5 MG tablet Take 0.5-1 mg by mouth 2 (two) times daily. 1 mg (2 tablets) in the morning and 0.5 mg (1 tablet) at night    . clopidogrel (PLAVIX) 75 MG tablet Take 75 mg by mouth every morning.    . dicyclomine (BENTYL) 10 MG capsule Take 10 mg by mouth 3 (three) times daily as needed for spasms.     Marland Kitchen esomeprazole (NEXIUM) 40 MG capsule Take 40 mg by mouth daily before breakfast.    . furosemide (LASIX) 40 MG tablet Take 40 mg by mouth.    Marland Kitchen glipiZIDE (GLUCOTROL) 10 MG tablet     . HYDROcodone-acetaminophen (NORCO/VICODIN) 5-325 MG per tablet Take 1 tablet by mouth every 6 (six) hours as needed for moderate pain.    Marland Kitchen insulin NPH (HUMULIN N,NOVOLIN N) 100 UNIT/ML injection Inject 30 Units into the skin daily before breakfast. Novolin-N    . insulin regular (NOVOLIN R,HUMULIN R) 100 units/mL injection Inject 0.05 mLs (5 Units total) into the skin 3 (three) times daily before meals.    Marland Kitchen levothyroxine (SYNTHROID, LEVOTHROID) 100 MCG tablet Take 100 mcg by mouth every morning.     . lidocaine (LIDODERM) 5 % Place 3 patches onto the skin daily. Remove & Discard patch within 12 hours or as directed by MD    . Linaclotide (LINZESS) 290 MCG CAPS capsule Take 290 mcg by mouth every morning.     . meclizine (ANTIVERT) 12.5 MG tablet Take 12.5 mg by mouth 3 (three) times daily as needed for dizziness.     . nortriptyline (PAMELOR) 50 MG capsule Take 50 mg by mouth at bedtime.    Marland Kitchen nystatin (MYCOSTATIN) 100000 UNIT/ML suspension Take 5 mLs by mouth daily.     . polyethylene glycol (MIRALAX / GLYCOLAX) packet Take 17 g by mouth daily as needed for mild constipation.    . potassium chloride (MICRO-K) 10 MEQ CR capsule Take 10 mEq by mouth daily as needed (leg cramps).     . propranolol (INDERAL) 40 MG tablet Take 1 tablet (40 mg total) by mouth every morning. 30 tablet 0  . rosuvastatin (CRESTOR) 20 MG tablet Take 20 mg by mouth every morning.     . Sennosides (EX-LAX PO) Take 1 tablet by mouth  daily as needed (constipation).     . sucralfate (CARAFATE) 1 G tablet Take 1-2 g by mouth daily.     Marland Kitchen terconazole (TERAZOL 7) 0.4 % vaginal cream     . traMADol (ULTRAM) 50 MG tablet Take by mouth every 6 (six) hours as needed.    . valsartan (DIOVAN) 160 MG tablet Take 160 mg by  mouth daily.     No facility-administered medications prior to visit.    PAST MEDICAL HISTORY: Past Medical History  Diagnosis Date  . DVT (deep venous thrombosis)   . Asthma   . Hypothyroidism   . Anxiety   . Depression   . H/O hiatal hernia   . Arthritis   . Anemia   . PE (pulmonary thromboembolism) x 3, last 14 years ago    history of recurrent RLE DVT with PE - last PE ~>13 yrs; Maintained on Plavix  . Hyperlipidemia   . Diabetes mellitus type 2, insulin dependent   . Hypertension associated with diabetes   . Glaucoma   . Cataract   . Diabetic neuropathy   . Palpitations 35years ago    Cardionet monitor - revealed mostly normal sinus rhythm, sinus bradycardia with first-degree A-V block heart rates mostly in the 50s and 60s with some 70s. No arrhythmias, PVCs or PACs noted.  Marland Kitchen TIA (transient ischemic attack) March 2014  . Diastolic dysfunction, left ventricle     mild LVH, EF 55% by echo; no ischemia by stress 01/2010 echo [facility])  . Stroke 04-19-2013    tia and stroke. Weakness Rt hand  . Sleep apnea     cpap disontinued; test done 07/14/2008 ordered per  [facility]  . Sleep apnea   . Restless legs syndrome   . Spinal stenosis     L 3, L 4 and L 5, and C 1, C 2 and C3  . Complication of anesthesia     hard to wake up after anesthesia, trouble turning head  . Spinal headache   . Memory loss 06/03/2014    PAST SURGICAL HISTORY: Past Surgical History  Procedure Laterality Date  . Abdominal hysterectomy    . Cholecystectomy    . Back surgery      steroid inj  . Tonsillectomy    . Appendectomy    . Esophagogastroduodenoscopy  08/09/2011    Procedure: ESOPHAGOGASTRODUODENOSCOPY (EGD);   Surgeon: Jeryl Columbia, MD;  Location: [facility];  Service: Endoscopy;  Laterality: N/A;  . Hot hemostasis  08/09/2011    Procedure: HOT HEMOSTASIS (ARGON PLASMA COAGULATION/BICAP);  Surgeon: Jeryl Columbia, MD;  Location: [facility];  Service: Endoscopy;  Laterality: N/A;  . Trigger finger release  11/22/2011    Procedure: RELEASE TRIGGER FINGER/A-1 PULLEY;  Surgeon: Meredith Pel, MD;  Location: [facility];  Service: Orthopedics;  Laterality: Left;  Left trigger thumb release  . Cardiac catheterization  02/14/2012    no evidence of obstructive coronary disease ,low EDP with normal EF  . Doppler echocardiography  04/19/2012    EF 0000000; normal diastolic pressures, no regional wall motion motion abnormalities  . Nm myocar perf wall motion  01/26/2012    EF 79%,LV normal,ST SEGMENT CHANGE SUGGESTIVE OF ISCHEMIA.  Eusebio Me doppler  05/29/2012    RIGHT EXTREM. NORMAL VENOUS DUPLEX  . Joint replacement      right knee x 1, left knee x 2  . Left knee spacer bar placement  done with 2nd left knee replacement  . Eye surgery Bilateral 4-5-yrs ago  . Right trigger finger release  yrs ago  . Esophagogastroduodenoscopy (egd) with propofol N/A 11/12/2013    Procedure: ESOPHAGOGASTRODUODENOSCOPY (EGD) WITH PROPOFOL;  Surgeon: Jeryl Columbia, MD;  Location: [facility];  Service: Endoscopy;  Laterality: N/A;  . Hot hemostasis N/A 11/12/2013    Procedure: HOT HEMOSTASIS (ARGON PLASMA COAGULATION/BICAP);  Surgeon: Jeryl Columbia, MD;  Location:  [facility];  Service: Endoscopy;  Laterality: N/A;  . Left heart catheterization with coronary angiogram N/A 02/14/2012    Procedure: LEFT HEART CATHETERIZATION WITH CORONARY ANGIOGRAM;  Surgeon: Leonie Man, MD;  Location: [facility] CATH LAB;  Service: Cardiovascular;  Laterality: N/A;    FAMILY HISTORY: Family History  Problem Relation Age of Onset  . Cancer Mother   . Cancer - Prostate Father     SOCIAL HISTORY: Social History   Social History  . Marital Status:  Married    Spouse Name: [name]  . Number of Children: 2  . Years of Education: college   Occupational History  . retired    Social History Main Topics  . Smoking status: Never Smoker   . Smokeless tobacco: Never Used  . Alcohol Use: No  . Drug Use: No  . Sexual Activity: Yes   Other Topics Concern  . Not on file   Social History Narrative   Married [name].) married 44 years.   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          PHYSICAL EXAM  Filed Vitals:   10/07/14 0915  Pulse: 65  Height: 5\' 7"  (1.702 m)  Weight: 280 lb (127.007 kg)   Body mass index is 43.84 kg/(m^2).  MMSE - Mini Mental State Exam 10/07/2014  Orientation to time 5  Orientation to Place 5  Registration 3  Attention/ Calculation 5  Recall 3  Language- name 2 objects 2  Language- repeat 1  Language- follow 3 step command 2  Language- read & follow direction 1  Write a sentence 1  Copy design 1  Total score 29       Generalized: Well developed, in no acute distress   Neurological examination  Mentation: Alert oriented to time, place, history taking. Follows all commands speech and language fluent. MMSE 29/30 Cranial nerve II-XII: Pupils were equal round reactive to light. Extraocular movements were full, visual field were full on confrontational test. Facial sensation and strength were normal. Uvula tongue midline. Head turning and shoulder shrug  were normal and symmetric. Motor: The motor testing reveals 5 over 5 strength in all extremities except 4/5 strength in the right upper extremity. Good symmetric motor tone is noted throughout.  Sensory: Sensory testing is intact to soft touch on all 4 extremities. No evidence of extinction is noted.  Coordination: Cerebellar testing reveals good finger-nose-finger and heel-to-shin bilaterally.  Gait and station: Gait is normal. Tandem gait is normal. Romberg is negative. No drift is seen.  Reflexes: Deep tendon  reflexes are symmetric and normal bilaterally.   DIAGNOSTIC DATA (LABS, IMAGING, TESTING) - I reviewed patient records, labs, notes, testing and imaging myself where available.  Lab Results  Component Value Date   WBC 6.5 04/19/2014   HGB 11.0* 04/19/2014   HCT 34.0* 04/19/2014   MCV 96.6 04/19/2014   PLT 221 04/19/2014      Component Value Date/Time   NA 140 04/21/2014 0440   K 3.9 04/21/2014 0440   CL 106 04/21/2014 0440   CO2 27 04/21/2014 0440   GLUCOSE 166* 04/21/2014 0440   BUN 18 04/21/2014 0440   CREATININE 1.35* 04/21/2014 0440   CALCIUM 8.3* 04/21/2014 0440   PROT 6.6 04/18/2014 2035   ALBUMIN 3.3* 04/18/2014 2035   AST 16 04/18/2014 2035   ALT 10 04/18/2014 2035   ALKPHOS 81 04/18/2014 2035   BILITOT 0.5 04/18/2014 2035   GFRNONAA 37*  04/21/2014 0440   GFRAA 43* 04/21/2014 0440     ASSESSMENT AND PLAN 74 y.o. year old female  has a past medical history of DVT (deep venous thrombosis); Asthma; Hypothyroidism; Anxiety; Depression; H/O hiatal hernia; Arthritis; Anemia; PE (pulmonary thromboembolism) (x 3, last 14 years ago); Hyperlipidemia; Diabetes mellitus type 2, insulin dependent; Hypertension associated with diabetes; Glaucoma; Cataract; Diabetic neuropathy; Palpitations (35years ago); TIA (transient ischemic attack) (March 2014); Diastolic dysfunction, left ventricle; Stroke (04-19-2013); Sleep apnea; Sleep apnea; Restless legs syndrome; Spinal stenosis; Complication of anesthesia; Spinal headache; and Memory loss (06/03/2014). here with:  1. Headache 2. Mild cognitive impairment 3. History of TIA  Today the patient reports a 10/10 headache that has been ongoing for the last month. She has tried taking tramadol without much relief. In the past she has been on tizanidine and according to the notes it was beneficial. The patient does not recall this medication. I will restart the patient on tizanidine 4 mg at bedtime for 1 week. If tolerating the patient will then  increase to one tablet twice a day thereafter.  I consulted with Dr. Leonie Man and he recommended a Toradol 30 mg IM injection today to give her some relief from her headache. Patient is amenable to this. Patient advised that Toradol can cause drowsiness. Her husband is here to drive her home. I have reviewed the side effects of tizanidine with the patient. If the patient does not receive any benefit from Toradol or Tizanidine in the next several days we will consider trying Tylenol No. 3. The patient is amenable to this. Patient's memory score is stable. MMSE is 29/30. We will continue to monitor her memory over time. She will follow-up in 6 months with Dr. Leonie Man.  Ward Givens, MSN, NP-C 10/07/2014, 9:19 AM [facility] [address], [place], [place] [address] [phone]

## 2014-10-09 ENCOUNTER — Ambulatory Visit: Payer: Medicare HMO | Admitting: Neurology

## 2014-10-09 ENCOUNTER — Telehealth: Payer: Self-pay | Admitting: *Deleted

## 2014-10-09 NOTE — Telephone Encounter (Signed)
Spoke to patient - says her headache resolved completely the same day as her injection.  She denied any nausea or drowsiness related to the medication.  Said she able to to run errands after leaving our office without any problems (her husband was driving) and then went home to rest.  She thanked [place] for calling to check on her again.

## 2014-10-10 NOTE — Progress Notes (Signed)
I agree with the above plan 

## 2014-10-20 MED ORDER — ACETAMINOPHEN-CODEINE #3 300-30 MG PO TABS: 1.0000 | ORAL_TABLET | Freq: Every day | ORAL | Status: DC | PRN

## 2014-10-20 NOTE — Telephone Encounter (Addendum)
Pt called and states that the injection she was given for her headache did not last but a couple of days. Says she was told that she could have a rx for Tylenol # 3. Would like to know if she can have rx . Please call and advise [phone]

## 2014-10-20 NOTE — Telephone Encounter (Signed)
Patient understood process.

## 2014-10-20 NOTE — Addendum Note (Signed)
Addended by: Trudie Buckler on: 10/20/2014 05:15 PM   Modules accepted: Orders

## 2014-10-20 NOTE — Telephone Encounter (Signed)
I called the patient. She states that she had 2 good days after having the toradol injection. She states since then her headache has started to come back and has gotten progressively worse each day. I will order the patient Tylenol No. 3 as previously discussed with Dr. Leonie Man. I explained  to the patient that this medication was meant to be taken for severe headache. It is not meant to be taken daily. Patient verbalized understanding.

## 2014-10-20 NOTE — Telephone Encounter (Signed)
I agree

## 2014-10-20 NOTE — Telephone Encounter (Signed)
Patient calling back and she wants to try Tylenol # 3 . Please ask Tara Jackson if she can call In RX. Relayed to patient Tara Jackson will call her back in between patient's . Patient understood 24-48 turn around time .

## 2014-10-21 ENCOUNTER — Telehealth: Payer: Self-pay | Admitting: *Deleted

## 2014-10-21 NOTE — Telephone Encounter (Signed)
Noted I will send to [facility] in pharmacy .

## 2014-10-21 NOTE — Telephone Encounter (Signed)
Rx was faxed.  I called back and spoke with patient.  She is aware.

## 2014-11-10 DIAGNOSIS — H401131 Primary open-angle glaucoma, bilateral, mild stage: Secondary | ICD-10-CM | POA: Diagnosis not present

## 2014-11-10 DIAGNOSIS — H35033 Hypertensive retinopathy, bilateral: Secondary | ICD-10-CM | POA: Diagnosis not present

## 2014-11-10 DIAGNOSIS — H1851 Endothelial corneal dystrophy: Secondary | ICD-10-CM | POA: Diagnosis not present

## 2014-11-11 DIAGNOSIS — K219 Gastro-esophageal reflux disease without esophagitis: Secondary | ICD-10-CM | POA: Diagnosis not present

## 2014-11-11 DIAGNOSIS — N183 Chronic kidney disease, stage 3 (moderate): Secondary | ICD-10-CM | POA: Diagnosis not present

## 2014-11-11 DIAGNOSIS — Z6841 Body Mass Index (BMI) 40.0 and over, adult: Secondary | ICD-10-CM | POA: Diagnosis not present

## 2014-11-11 DIAGNOSIS — Z23 Encounter for immunization: Secondary | ICD-10-CM | POA: Diagnosis not present

## 2014-11-11 DIAGNOSIS — M810 Age-related osteoporosis without current pathological fracture: Secondary | ICD-10-CM | POA: Diagnosis not present

## 2014-11-14 ENCOUNTER — Other Ambulatory Visit (HOSPITAL_COMMUNITY): Payer: Self-pay | Admitting: Internal Medicine

## 2014-11-26 DIAGNOSIS — R309 Painful micturition, unspecified: Secondary | ICD-10-CM | POA: Diagnosis not present

## 2014-11-28 ENCOUNTER — Observation Stay (HOSPITAL_BASED_OUTPATIENT_CLINIC_OR_DEPARTMENT_OTHER)
Admission: EM | Admit: 2014-11-28 | Discharge: 2014-11-30 | Disposition: A | Discharge status: Home / Self Care | Payer: Medicare HMO | Attending: Internal Medicine | Admitting: Internal Medicine

## 2014-11-28 ENCOUNTER — Encounter (HOSPITAL_BASED_OUTPATIENT_CLINIC_OR_DEPARTMENT_OTHER): Payer: Self-pay

## 2014-11-28 ENCOUNTER — Emergency Department (HOSPITAL_BASED_OUTPATIENT_CLINIC_OR_DEPARTMENT_OTHER): Payer: Medicare HMO

## 2014-11-28 ENCOUNTER — Observation Stay (HOSPITAL_BASED_OUTPATIENT_CLINIC_OR_DEPARTMENT_OTHER): Payer: Medicare HMO

## 2014-11-28 DIAGNOSIS — I130 Hypertensive heart and chronic kidney disease with heart failure and stage 1 through stage 4 chronic kidney disease, or unspecified chronic kidney disease: Secondary | ICD-10-CM | POA: Diagnosis not present

## 2014-11-28 DIAGNOSIS — R079 Chest pain, unspecified: Secondary | ICD-10-CM | POA: Diagnosis not present

## 2014-11-28 DIAGNOSIS — G2581 Restless legs syndrome: Secondary | ICD-10-CM | POA: Insufficient documentation

## 2014-11-28 DIAGNOSIS — Z794 Long term (current) use of insulin: Secondary | ICD-10-CM | POA: Insufficient documentation

## 2014-11-28 DIAGNOSIS — I878 Other specified disorders of veins: Secondary | ICD-10-CM | POA: Diagnosis present

## 2014-11-28 DIAGNOSIS — Z91041 Radiographic dye allergy status: Secondary | ICD-10-CM | POA: Insufficient documentation

## 2014-11-28 DIAGNOSIS — G4733 Obstructive sleep apnea (adult) (pediatric): Secondary | ICD-10-CM | POA: Insufficient documentation

## 2014-11-28 DIAGNOSIS — E039 Hypothyroidism, unspecified: Secondary | ICD-10-CM | POA: Insufficient documentation

## 2014-11-28 DIAGNOSIS — E119 Type 2 diabetes mellitus without complications: Secondary | ICD-10-CM | POA: Diagnosis not present

## 2014-11-28 DIAGNOSIS — I509 Heart failure, unspecified: Secondary | ICD-10-CM

## 2014-11-28 DIAGNOSIS — R001 Bradycardia, unspecified: Secondary | ICD-10-CM | POA: Diagnosis not present

## 2014-11-28 DIAGNOSIS — I5032 Chronic diastolic (congestive) heart failure: Secondary | ICD-10-CM | POA: Diagnosis not present

## 2014-11-28 DIAGNOSIS — F329 Major depressive disorder, single episode, unspecified: Secondary | ICD-10-CM | POA: Insufficient documentation

## 2014-11-28 DIAGNOSIS — E1129 Type 2 diabetes mellitus with other diabetic kidney complication: Secondary | ICD-10-CM

## 2014-11-28 DIAGNOSIS — Z6841 Body Mass Index (BMI) 40.0 and over, adult: Secondary | ICD-10-CM | POA: Insufficient documentation

## 2014-11-28 DIAGNOSIS — N183 Chronic kidney disease, stage 3 (moderate): Secondary | ICD-10-CM | POA: Insufficient documentation

## 2014-11-28 DIAGNOSIS — R600 Localized edema: Secondary | ICD-10-CM | POA: Diagnosis not present

## 2014-11-28 DIAGNOSIS — E1122 Type 2 diabetes mellitus with diabetic chronic kidney disease: Secondary | ICD-10-CM | POA: Diagnosis not present

## 2014-11-28 DIAGNOSIS — R0789 Other chest pain: Principal | ICD-10-CM | POA: Insufficient documentation

## 2014-11-28 DIAGNOSIS — E785 Hyperlipidemia, unspecified: Secondary | ICD-10-CM | POA: Diagnosis not present

## 2014-11-28 DIAGNOSIS — R05 Cough: Secondary | ICD-10-CM | POA: Diagnosis not present

## 2014-11-28 DIAGNOSIS — Z86711 Personal history of pulmonary embolism: Secondary | ICD-10-CM | POA: Insufficient documentation

## 2014-11-28 DIAGNOSIS — R002 Palpitations: Secondary | ICD-10-CM | POA: Insufficient documentation

## 2014-11-28 DIAGNOSIS — Z7902 Long term (current) use of antithrombotics/antiplatelets: Secondary | ICD-10-CM | POA: Insufficient documentation

## 2014-11-28 DIAGNOSIS — Z79899 Other long term (current) drug therapy: Secondary | ICD-10-CM | POA: Insufficient documentation

## 2014-11-28 DIAGNOSIS — Z86718 Personal history of other venous thrombosis and embolism: Secondary | ICD-10-CM | POA: Insufficient documentation

## 2014-11-28 DIAGNOSIS — F419 Anxiety disorder, unspecified: Secondary | ICD-10-CM | POA: Insufficient documentation

## 2014-11-28 DIAGNOSIS — J45909 Unspecified asthma, uncomplicated: Secondary | ICD-10-CM | POA: Insufficient documentation

## 2014-11-28 DIAGNOSIS — Z96653 Presence of artificial knee joint, bilateral: Secondary | ICD-10-CM | POA: Insufficient documentation

## 2014-11-28 DIAGNOSIS — R69 Illness, unspecified: Secondary | ICD-10-CM | POA: Diagnosis not present

## 2014-11-28 DIAGNOSIS — M1990 Unspecified osteoarthritis, unspecified site: Secondary | ICD-10-CM | POA: Insufficient documentation

## 2014-11-28 DIAGNOSIS — N1832 Chronic kidney disease, stage 3b: Secondary | ICD-10-CM

## 2014-11-28 DIAGNOSIS — Z882 Allergy status to sulfonamides status: Secondary | ICD-10-CM | POA: Insufficient documentation

## 2014-11-28 DIAGNOSIS — E1140 Type 2 diabetes mellitus with diabetic neuropathy, unspecified: Secondary | ICD-10-CM | POA: Insufficient documentation

## 2014-11-28 DIAGNOSIS — Z888 Allergy status to other drugs, medicaments and biological substances status: Secondary | ICD-10-CM | POA: Insufficient documentation

## 2014-11-28 DIAGNOSIS — Z8673 Personal history of transient ischemic attack (TIA), and cerebral infarction without residual deficits: Secondary | ICD-10-CM | POA: Insufficient documentation

## 2014-11-28 LAB — CBC WITH DIFFERENTIAL/PLATELET
Basophils Absolute: 0 10*3/uL (ref 0.0–0.1)
Basophils Relative: 1 %
Eosinophils Absolute: 0.2 10*3/uL (ref 0.0–0.7)
Eosinophils Relative: 3 %
HCT: 33.6 % — ABNORMAL LOW (ref 36.0–46.0)
Hemoglobin: 10.8 g/dL — ABNORMAL LOW (ref 12.0–15.0)
Lymphocytes Relative: 31 %
Lymphs Abs: 1.9 10*3/uL (ref 0.7–4.0)
MCH: 31.4 pg (ref 26.0–34.0)
MCHC: 32.1 g/dL (ref 30.0–36.0)
MCV: 97.7 fL (ref 78.0–100.0)
Monocytes Absolute: 0.4 10*3/uL (ref 0.1–1.0)
Monocytes Relative: 7 %
Neutro Abs: 3.7 10*3/uL (ref 1.7–7.7)
Neutrophils Relative %: 58 %
Platelets: 245 10*3/uL (ref 150–400)
RBC: 3.44 MIL/uL — ABNORMAL LOW (ref 3.87–5.11)
RDW: 12.3 % (ref 11.5–15.5)
WBC: 6.2 10*3/uL (ref 4.0–10.5)

## 2014-11-28 LAB — URINE MICROSCOPIC-ADD ON

## 2014-11-28 LAB — COMPREHENSIVE METABOLIC PANEL
ALT: 10 U/L — ABNORMAL LOW (ref 14–54)
AST: 21 U/L (ref 15–41)
Albumin: 3.5 g/dL (ref 3.5–5.0)
Alkaline Phosphatase: 65 U/L (ref 38–126)
Anion gap: 7 (ref 5–15)
BUN: 19 mg/dL (ref 6–20)
CO2: 27 mmol/L (ref 22–32)
Calcium: 8.7 mg/dL — ABNORMAL LOW (ref 8.9–10.3)
Chloride: 104 mmol/L (ref 101–111)
Creatinine, Ser: 1.48 mg/dL — ABNORMAL HIGH (ref 0.44–1.00)
GFR calc Af Amer: 38 mL/min — ABNORMAL LOW (ref >60)
GFR calc non Af Amer: 33 mL/min — ABNORMAL LOW (ref >60)
Glucose, Bld: 229 mg/dL — ABNORMAL HIGH (ref 65–99)
Potassium: 4.5 mmol/L (ref 3.5–5.1)
Sodium: 138 mmol/L (ref 135–145)
Total Bilirubin: 0.5 mg/dL (ref 0.3–1.2)
Total Protein: 6.7 g/dL (ref 6.5–8.1)

## 2014-11-28 LAB — URINALYSIS, ROUTINE W REFLEX MICROSCOPIC
Bilirubin Urine: NEGATIVE
Glucose, UA: NEGATIVE mg/dL
Hgb urine dipstick: NEGATIVE
Ketones, ur: NEGATIVE mg/dL
Leukocytes, UA: NEGATIVE
Nitrite: POSITIVE — AB
Protein, ur: NEGATIVE mg/dL
Specific Gravity, Urine: 1.015 (ref 1.005–1.030)
Urobilinogen, UA: 1 mg/dL (ref 0.0–1.0)
pH: 5.5 (ref 5.0–8.0)

## 2014-11-28 LAB — TROPONIN I
Troponin I: <0.03 ng/mL (ref <0.031)
Troponin I: <0.03 ng/mL (ref <0.031)
Troponin I: <0.03 ng/mL (ref <0.031)

## 2014-11-28 LAB — D-DIMER, QUANTITATIVE: D-Dimer, Quant: <0.27 ug/mL-FEU (ref 0.00–0.48)

## 2014-11-28 LAB — CREATININE, SERUM
Creatinine, Ser: 1.56 mg/dL — ABNORMAL HIGH (ref 0.44–1.00)
GFR calc Af Amer: 36 mL/min — ABNORMAL LOW (ref >60)
GFR calc non Af Amer: 31 mL/min — ABNORMAL LOW (ref >60)

## 2014-11-28 LAB — CBC
HCT: 34.2 % — ABNORMAL LOW (ref 36.0–46.0)
Hemoglobin: 10.9 g/dL — ABNORMAL LOW (ref 12.0–15.0)
MCH: 30.6 pg (ref 26.0–34.0)
MCHC: 31.9 g/dL (ref 30.0–36.0)
MCV: 96.1 fL (ref 78.0–100.0)
Platelets: 236 10*3/uL (ref 150–400)
RBC: 3.56 MIL/uL — ABNORMAL LOW (ref 3.87–5.11)
RDW: 12.7 % (ref 11.5–15.5)
WBC: 6.6 10*3/uL (ref 4.0–10.5)

## 2014-11-28 LAB — GLUCOSE, CAPILLARY
Glucose-Capillary: 221 mg/dL — ABNORMAL HIGH (ref 65–99)
Glucose-Capillary: 274 mg/dL — ABNORMAL HIGH (ref 65–99)

## 2014-11-28 LAB — BRAIN NATRIURETIC PEPTIDE: B Natriuretic Peptide: 134.9 pg/mL — ABNORMAL HIGH (ref 0.0–100.0)

## 2014-11-28 MED ORDER — BUSPIRONE HCL 10 MG PO TABS
10.0000 mg | ORAL_TABLET | Freq: Every morning | ORAL | Status: DC
  Administered 2014-11-29 – 2014-11-30 (×2): 10 mg via ORAL
  Filled 2014-11-28 (×4): qty 1

## 2014-11-28 MED ORDER — L-METHYLFOLATE-B6-B12 3-35-2 MG PO TABS
1.0000 | ORAL_TABLET | Freq: Every day | ORAL | Status: DC
  Administered 2014-11-29: 1 via ORAL
  Filled 2014-11-28 (×2): qty 1

## 2014-11-28 MED ORDER — INSULIN NPH (HUMAN) (ISOPHANE) 100 UNIT/ML ~~LOC~~ SUSP
20.0000 [IU] | Freq: Every day | SUBCUTANEOUS | Status: DC
  Administered 2014-11-29: 20 [IU] via SUBCUTANEOUS
  Filled 2014-11-28: qty 10

## 2014-11-28 MED ORDER — ACETAMINOPHEN 325 MG PO TABS
650.0000 mg | ORAL_TABLET | ORAL | Status: DC | PRN
Start: 2014-11-28 — End: 2014-11-30
  Administered 2014-11-28 – 2014-11-30 (×3): 650 mg via ORAL
  Filled 2014-11-28 (×2): qty 2

## 2014-11-28 MED ORDER — POTASSIUM CHLORIDE CRYS ER 20 MEQ PO TBCR
20.0000 meq | EXTENDED_RELEASE_TABLET | Freq: Every day | ORAL | Status: DC
  Administered 2014-11-29 – 2014-11-30 (×2): 20 meq via ORAL
  Filled 2014-11-28 (×2): qty 1

## 2014-11-28 MED ORDER — IRBESARTAN 300 MG PO TABS
150.0000 mg | ORAL_TABLET | Freq: Every day | ORAL | Status: DC
  Administered 2014-11-29 – 2014-11-30 (×2): 150 mg via ORAL
  Filled 2014-11-28 (×2): qty 1

## 2014-11-28 MED ORDER — ASPIRIN 81 MG PO CHEW: 324.0000 mg | CHEWABLE_TABLET | ORAL | Status: DC

## 2014-11-28 MED ORDER — CLOPIDOGREL BISULFATE 75 MG PO TABS
75.0000 mg | ORAL_TABLET | Freq: Every morning | ORAL | Status: DC
  Administered 2014-11-29 – 2014-11-30 (×2): 75 mg via ORAL
  Filled 2014-11-28 (×3): qty 1

## 2014-11-28 MED ORDER — DICYCLOMINE HCL 10 MG PO CAPS: 10.0000 mg | ORAL_CAPSULE | Freq: Three times a day (TID) | ORAL | Status: DC | PRN

## 2014-11-28 MED ORDER — DICLOFENAC SODIUM 1 % TD GEL
2.0000 g | Freq: Four times a day (QID) | TRANSDERMAL | Status: DC
  Administered 2014-11-28 – 2014-11-29 (×5): 2 g via TOPICAL
  Filled 2014-11-28: qty 100

## 2014-11-28 MED ORDER — ACETAMINOPHEN-CODEINE #3 300-30 MG PO TABS: 1.0000 | ORAL_TABLET | Freq: Every day | ORAL | Status: DC | PRN

## 2014-11-28 MED ORDER — ONDANSETRON HCL 4 MG/2ML IJ SOLN: 4.0000 mg | Freq: Three times a day (TID) | INTRAMUSCULAR | Status: AC | PRN

## 2014-11-28 MED ORDER — SUCRALFATE 1 G PO TABS
1.0000 g | ORAL_TABLET | Freq: Every day | ORAL | Status: DC
  Administered 2014-11-29 – 2014-11-30 (×2): 1 g via ORAL
  Filled 2014-11-28 (×2): qty 1

## 2014-11-28 MED ORDER — PROPRANOLOL HCL 40 MG PO TABS: 40.0000 mg | ORAL_TABLET | Freq: Every morning | ORAL | Status: DC

## 2014-11-28 MED ORDER — ASPIRIN EC 81 MG PO TBEC
81.0000 mg | DELAYED_RELEASE_TABLET | Freq: Every day | ORAL | Status: DC
  Administered 2014-11-29 – 2014-11-30 (×2): 81 mg via ORAL
  Filled 2014-11-28 (×2): qty 1

## 2014-11-28 MED ORDER — INSULIN ASPART 100 UNIT/ML ~~LOC~~ SOLN
3.0000 [IU] | Freq: Three times a day (TID) | SUBCUTANEOUS | Status: DC
  Administered 2014-11-28 – 2014-11-30 (×5): 3 [IU] via SUBCUTANEOUS

## 2014-11-28 MED ORDER — FUROSEMIDE 10 MG/ML IJ SOLN
60.0000 mg | Freq: Once | INTRAMUSCULAR | Status: AC
  Administered 2014-11-28: 60 mg via INTRAVENOUS
  Filled 2014-11-28: qty 6

## 2014-11-28 MED ORDER — LEVOTHYROXINE SODIUM 100 MCG PO TABS
100.0000 ug | ORAL_TABLET | Freq: Every morning | ORAL | Status: DC
  Administered 2014-11-29 – 2014-11-30 (×2): 100 ug via ORAL
  Filled 2014-11-28 (×2): qty 1

## 2014-11-28 MED ORDER — ROSUVASTATIN CALCIUM 20 MG PO TABS
20.0000 mg | ORAL_TABLET | Freq: Every morning | ORAL | Status: DC
  Administered 2014-11-29 – 2014-11-30 (×2): 20 mg via ORAL
  Filled 2014-11-28 (×2): qty 1

## 2014-11-28 MED ORDER — PANTOPRAZOLE SODIUM 40 MG PO TBEC
40.0000 mg | DELAYED_RELEASE_TABLET | Freq: Every day | ORAL | Status: DC
  Administered 2014-11-29 – 2014-11-30 (×2): 40 mg via ORAL
  Filled 2014-11-28 (×2): qty 1

## 2014-11-28 MED ORDER — ASPIRIN 300 MG RE SUPP: 300.0000 mg | RECTAL | Status: DC

## 2014-11-28 MED ORDER — LINACLOTIDE 290 MCG PO CAPS
290.0000 ug | ORAL_CAPSULE | Freq: Every morning | ORAL | Status: DC
  Administered 2014-11-29 – 2014-11-30 (×2): 290 ug via ORAL
  Filled 2014-11-28 (×2): qty 1

## 2014-11-28 MED ORDER — CLONAZEPAM 0.5 MG PO TABS
0.5000 mg | ORAL_TABLET | Freq: Two times a day (BID) | ORAL | Status: DC | PRN
  Administered 2014-11-29: 0.5 mg via ORAL
  Filled 2014-11-28: qty 1

## 2014-11-28 MED ORDER — ONDANSETRON HCL 4 MG/2ML IJ SOLN: 4.0000 mg | Freq: Four times a day (QID) | INTRAMUSCULAR | Status: DC | PRN

## 2014-11-28 MED ORDER — CHOLECALCIFEROL 10 MCG (400 UNIT) PO TABS
400.0000 [IU] | ORAL_TABLET | Freq: Every day | ORAL | Status: DC
  Administered 2014-11-29 – 2014-11-30 (×2): 400 [IU] via ORAL
  Filled 2014-11-28 (×2): qty 1

## 2014-11-28 MED ORDER — GLIPIZIDE 10 MG PO TABS
20.0000 mg | ORAL_TABLET | Freq: Every day | ORAL | Status: DC
  Administered 2014-11-29 – 2014-11-30 (×2): 20 mg via ORAL
  Filled 2014-11-28 (×3): qty 2

## 2014-11-28 MED ORDER — FUROSEMIDE 40 MG PO TABS
40.0000 mg | ORAL_TABLET | Freq: Every day | ORAL | Status: DC
  Administered 2014-11-29 – 2014-11-30 (×2): 40 mg via ORAL
  Filled 2014-11-28 (×2): qty 1

## 2014-11-28 MED ORDER — PROPRANOLOL HCL 10 MG PO TABS
10.0000 mg | ORAL_TABLET | Freq: Every morning | ORAL | Status: DC
  Filled 2014-11-28: qty 1

## 2014-11-28 MED ORDER — ENOXAPARIN SODIUM 40 MG/0.4ML ~~LOC~~ SOLN
40.0000 mg | SUBCUTANEOUS | Status: DC
  Administered 2014-11-28 – 2014-11-29 (×2): 40 mg via SUBCUTANEOUS
  Filled 2014-11-28 (×2): qty 0.4

## 2014-11-28 MED ORDER — NITROGLYCERIN 0.4 MG SL SUBL: 0.4000 mg | SUBLINGUAL_TABLET | SUBLINGUAL | Status: DC | PRN

## 2014-11-28 MED ORDER — NORTRIPTYLINE HCL 25 MG PO CAPS
50.0000 mg | ORAL_CAPSULE | Freq: Every day | ORAL | Status: DC
  Administered 2014-11-28 – 2014-11-29 (×2): 50 mg via ORAL
  Filled 2014-11-28 (×3): qty 2

## 2014-11-28 NOTE — Progress Notes (Signed)
  Echocardiogram 2D Echocardiogram has been performed.  Johny Chess 11/28/2014, 4:52 PM

## 2014-11-28 NOTE — ED Notes (Signed)
[facility] is aware of room 3E23 for transport

## 2014-11-28 NOTE — H&P (Addendum)
[facility] History and Physical  Tara Jackson [id number] DOB: 1940-11-18 DOA: 11/28/2014   PCP: [name], MD    Chief Complaint: chest pain  HPI: Tara Jackson is a 74 y.o. female with PMH of DVT, TIA, hypothyroidism, hypertension, diabetes mellitus insulin requiring, anxiety, depression, sleep apnea & diastolic heart failure. The patient presents with right-sided chest pain which has been present for the past 2 weeks now. She thinks that she may have "thrown another PE". She states that it is present in her right breast. She doesn't have any exacerbating factors. When it is severe it is about a 10 out of 10. When it was last severe, she decided to lay down in her recliner with her feet up and it resolved in an hour. She recently went to her neurologist and had a prescription for Tylenol #3 given to her for headaches. This is helping relieve her pain for about 4 hours after which the pain slowly starts to recur. She has had shortness of breath when the pain is severe and on exertion. No complaints of palpitations, nausea or diaphoresis during her episode of pain. The pain does not radiate. It is not worse when she takes a deep breath or when she coughs.  She also describes a separate pain in her mid upper back bilaterally which has been going on for the past 2 days and is not related to exertion or position. Tylenol 3 helps with this as well.   General: The patient denies anorexia, fever, weight loss Cardiac: Denies syncope, palpitations, + pedal edema  Respiratory: Denies cough, wheezing + shortness of breath on exertion GI: Denies severe indigestion/heartburn, abdominal pain, nausea, vomiting, diarrhea and constipation GU: Denies hematuria, incontinence, dysuria  Musculoskeletal: Denies arthritis  Skin: Denies suspicious skin lesions Neurologic: Denies focal weakness or numbness, change in vision Psychiatry: Denies depression or anxiety. Hematologic: no easy bruising or  bleeding  All other systems reviewed and found to be negative.  Past Medical History  Diagnosis Date  . DVT (deep venous thrombosis) ([place])   . Asthma   . Hypothyroidism   . Anxiety   . Depression   . H/O hiatal hernia   . Arthritis   . Anemia   . PE (pulmonary thromboembolism) (HCC) x 3, last 14 years ago    history of recurrent RLE DVT with PE - last PE ~>13 yrs; Maintained on Plavix  . Hyperlipidemia   . Diabetes mellitus type 2, insulin dependent ([place])   . Hypertension associated with diabetes ([place])   . Glaucoma   . Cataract   . Diabetic neuropathy ([place])   . Palpitations 35years ago    Cardionet monitor - revealed mostly normal sinus rhythm, sinus bradycardia with first-degree A-V block heart rates mostly in the 50s and 60s with some 70s. No arrhythmias, PVCs or PACs noted.  Marland Kitchen TIA (transient ischemic attack) March 2014  . Diastolic dysfunction, left ventricle     mild LVH, EF 55% by echo; no ischemia by stress 01/2010 echo [facility])  . Stroke [facility]) 04-19-2013    tia and stroke. Weakness Rt hand  . Sleep apnea     cpap disontinued; test done 07/14/2008 ordered per  Byrum  . Sleep apnea   . Restless legs syndrome   . Spinal stenosis     L 3, L 4 and L 5, and C 1, C 2 and C3  . Complication of anesthesia     hard to wake up after anesthesia, trouble turning head  .  Spinal headache   . Memory loss 06/03/2014    Past Surgical History  Procedure Laterality Date  . Abdominal hysterectomy    . Cholecystectomy    . Back surgery      steroid inj  . Tonsillectomy    . Appendectomy    . Esophagogastroduodenoscopy  08/09/2011    Procedure: ESOPHAGOGASTRODUODENOSCOPY (EGD);  Surgeon: Jeryl Columbia, MD;  Location: [facility];  Service: Endoscopy;  Laterality: N/A;  . Hot hemostasis  08/09/2011    Procedure: HOT HEMOSTASIS (ARGON PLASMA COAGULATION/BICAP);  Surgeon: Jeryl Columbia, MD;  Location: [facility];  Service: Endoscopy;  Laterality: N/A;  . Trigger finger release  11/22/2011     Procedure: RELEASE TRIGGER FINGER/A-1 PULLEY;  Surgeon: Meredith Pel, MD;  Location: [place];  Service: Orthopedics;  Laterality: Left;  Left trigger thumb release  . Cardiac catheterization  02/14/2012    no evidence of obstructive coronary disease ,low EDP with normal EF  . Doppler echocardiography  04/19/2012    EF 0000000; normal diastolic pressures, no regional wall motion motion abnormalities  . Nm myocar perf wall motion  01/26/2012    EF 79%,LV normal,ST SEGMENT CHANGE SUGGESTIVE OF ISCHEMIA.  Eusebio Me doppler  05/29/2012    RIGHT EXTREM. NORMAL VENOUS DUPLEX  . Joint replacement      right knee x 1, left knee x 2  . Left knee spacer bar placement  done with 2nd left knee replacement  . Eye surgery Bilateral 4-5-yrs ago  . Right trigger finger release  yrs ago  . Esophagogastroduodenoscopy (egd) with propofol N/A 11/12/2013    Procedure: ESOPHAGOGASTRODUODENOSCOPY (EGD) WITH PROPOFOL;  Surgeon: Jeryl Columbia, MD;  Location: [facility];  Service: Endoscopy;  Laterality: N/A;  . Hot hemostasis N/A 11/12/2013    Procedure: HOT HEMOSTASIS (ARGON PLASMA COAGULATION/BICAP);  Surgeon: Jeryl Columbia, MD;  Location: [facility];  Service: Endoscopy;  Laterality: N/A;  . Left heart catheterization with coronary angiogram N/A 02/14/2012    Procedure: LEFT HEART CATHETERIZATION WITH CORONARY ANGIOGRAM;  Surgeon: Leonie Man, MD;  Location: [facility] CATH LAB;  Service: Cardiovascular;  Laterality: N/A;    Social History: does not smoke or drink alcohol. No use of illicit drugs. Able to take care of her ADLs.   Allergies  Allergen Reactions  . Iodine Other (See Comments)    ONLY IV--Makes unconscious  . Metoclopramide Hcl Other (See Comments)    suicidal  . Sulfa Antibiotics Hives  . Iohexol      Code: SOB, Desc: cardiac arrest w/ iv contrast, has never used 13 hr prep//alice calhoun, Onset Date: [id number]   . Lactose Intolerance (Gi) Diarrhea  . Lansoprazole Nausea And Vomiting     Family history:   Family History  Problem Relation Age of Onset  . Cancer Mother   . Cancer - Prostate Father       Prior to Admission medications   Medication Sig Start Date End Date Taking? Authorizing Provider  acetaminophen-codeine (TYLENOL #3) 300-30 MG per tablet Take 1 tablet by mouth daily as needed for moderate pain or severe pain. 10/20/14  Yes Ward Givens, NP  busPIRone (BUSPAR) 10 MG tablet Take 1 tablet (10 mg total) by mouth every morning. 06/03/14  Yes Garvin Fila, MD  Cholecalciferol (VITAMIN D-3 PO) Take 1 tablet by mouth every morning.    Yes Historical Provider, MD  clonazePAM (KLONOPIN) 0.5 MG tablet Take 0.5-1 mg by mouth 2 (two) times daily. 1 mg (2 tablets) in  the morning and 0.5 mg (1 tablet) at night   Yes Historical Provider, MD  clopidogrel (PLAVIX) 75 MG tablet Take 75 mg by mouth every morning.   Yes Historical Provider, MD  dicyclomine (BENTYL) 10 MG capsule Take 10 mg by mouth 3 (three) times daily as needed for spasms.    Yes Historical Provider, MD  esomeprazole (NEXIUM) 40 MG capsule Take 40 mg by mouth daily before breakfast.   Yes Historical Provider, MD  furosemide (LASIX) 40 MG tablet Take 40 mg by mouth.   Yes Historical Provider, MD  glipiZIDE (GLUCOTROL) 10 MG tablet  05/03/14  Yes Historical Provider, MD  insulin NPH (HUMULIN N,NOVOLIN N) 100 UNIT/ML injection Inject 30 Units into the skin daily before breakfast. Novolin-N   Yes Historical Provider, MD  insulin regular (NOVOLIN R,HUMULIN R) 100 units/mL injection Inject 0.05 mLs (5 Units total) into the skin 3 (three) times daily before meals. 04/21/14  Yes Modena Jansky, MD  l-methylfolate-B6-B12 (METANX) 3-35-2 MG TABS tablet Take 1 tablet by mouth daily.   Yes Historical Provider, MD  levothyroxine (SYNTHROID, LEVOTHROID) 100 MCG tablet Take 100 mcg by mouth every morning.    Yes Historical Provider, MD  lidocaine (LIDODERM) 5 % Place 3 patches onto the skin daily. Remove & Discard patch  within 12 hours or as directed by MD   Yes Historical Provider, MD  Linaclotide (LINZESS) 290 MCG CAPS capsule Take 290 mcg by mouth every morning.    Yes Historical Provider, MD  LORazepam (ATIVAN) 0.5 MG tablet TK 1 T PO AS NEEDED FOR ANXIETY ATTACK 09/10/14  Yes Historical Provider, MD  LOSARTAN POTASSIUM PO Take by mouth.   Yes Historical Provider, MD  nortriptyline (PAMELOR) 50 MG capsule Take 50 mg by mouth at bedtime.   Yes Historical Provider, MD  nystatin (MYCOSTATIN) 100000 UNIT/ML suspension Take 5 mLs by mouth daily.  04/17/14  Yes Historical Provider, MD  potassium chloride (MICRO-K) 10 MEQ CR capsule Take 10 mEq by mouth daily as needed (leg cramps).    Yes Historical Provider, MD  propranolol (INDERAL) 40 MG tablet Take 1 tablet (40 mg total) by mouth every morning. 04/21/14  Yes Modena Jansky, MD  rosuvastatin (CRESTOR) 20 MG tablet Take 20 mg by mouth every morning.    Yes Historical Provider, MD  sucralfate (CARAFATE) 1 G tablet Take 1-2 g by mouth daily.    Yes Historical Provider, MD  tiZANidine (ZANAFLEX) 4 MG tablet TAKE 1 TABLET BY MOUTH EVERY NIGHT AT BEDTIME FOR 1 WEEK, THEN INCREASE TO 1 TABLET BY MOUTH TWICE DAILY THERAFTER 10/07/14  Yes Ward Givens, NP  valsartan (DIOVAN) 160 MG tablet Take 160 mg by mouth daily.   Yes Historical Provider, MD  albuterol (PROVENTIL HFA;VENTOLIN HFA) 108 (90 BASE) MCG/ACT inhaler Inhale 2 puffs into the lungs every 6 (six) hours as needed for wheezing.    Historical Provider, MD  meclizine (ANTIVERT) 12.5 MG tablet Take 12.5 mg by mouth 3 (three) times daily as needed for dizziness.     Historical Provider, MD  polyethylene glycol (MIRALAX / GLYCOLAX) packet Take 17 g by mouth daily as needed for mild constipation.    Historical Provider, MD  Sennosides (EX-LAX PO) Take 1 tablet by mouth daily as needed (constipation).     Historical Provider, MD  terconazole (TERAZOL 7) 0.4 % vaginal cream  04/18/14   Historical Provider, MD      Physical Exam: Filed Vitals:   11/28/14 1215 11/28/14 1230 11/28/14  1251 11/28/14 1405  BP:   162/67 174/82  Pulse: 47 47 52 45  Temp:    98 F (36.7 C)  TempSrc:    Oral  Resp: 18 21 16 18   Height:    5\' 7"  (1.702 m)  Weight:    131.861 kg (290 lb 11.2 oz)  SpO2: 100% 100% 100% 99%     General: Elderly female sitting up in bed in no acute distress HEENT: Normocephalic and Atraumatic, Mucous membranes pink                PERRLA; EOM intact; No scleral icterus,                 Nares: Patent, Oropharynx: Clear, Fair Dentition                 Neck: FROM, no cervical lymphadenopathy, thyromegaly, carotid bruit or JVD;  Breasts: deferred CHEST WALL: Tenderness in right breast medial aspect near the chest wall CHEST: Normal respiration, clear to auscultation bilaterally - pulse ox checked by myself is 95-96% on room air HEART: Regular rate and rhythm; no murmurs rubs or gallops  BACK: No kyphosis or scoliosis; no CVA tenderness  GI: Positive Bowel Sounds, soft, non-tender; no masses, no organomegaly Rectal Exam: deferred MSK: No cyanosis, clubbing, +1 pitting edema bilaterally Genitalia: not examined  SKIN:  no rash or ulceration  CNS: Alert and Oriented x 4, Nonfocal exam, CN 2-12 intact  Labs on Admission:  Basic Metabolic Panel:  Recent Labs Lab 11/28/14 1030  NA 138  K 4.5  CL 104  CO2 27  GLUCOSE 229*  BUN 19  CREATININE 1.48*  CALCIUM 8.7*   Liver Function Tests:  Recent Labs Lab 11/28/14 1030  AST 21  ALT 10*  ALKPHOS 65  BILITOT 0.5  PROT 6.7  ALBUMIN 3.5   No results for input(s): LIPASE, AMYLASE in the last 168 hours. No results for input(s): AMMONIA in the last 168 hours. CBC:  Recent Labs Lab 11/28/14 1030  WBC 6.2  NEUTROABS 3.7  HGB 10.8*  HCT 33.6*  MCV 97.7  PLT 245   Cardiac Enzymes:  Recent Labs Lab 11/28/14 1030  TROPONINI <0.03    BNP (last 3 results)  Recent Labs  04/18/14 2319 04/19/14 1447 11/28/14 1030   BNP 120.6* 74.6 134.9*    ProBNP (last 3 results) No results for input(s): PROBNP in the last 8760 hours.  CBG: No results for input(s): GLUCAP in the last 168 hours.  Radiological Exams on Admission: Dg Chest 2 View  11/28/2014  CLINICAL DATA:  Cough EXAM: CHEST  2 VIEW COMPARISON:  04/19/2014 chest radiograph FINDINGS: Stable cardiomediastinal silhouette with mild cardiomegaly. No pneumothorax. No pleural effusion. There is mild pulmonary edema. Surgical clip is seen in the upper abdomen. IMPRESSION: Mild congestive heart failure. Electronically Signed   By: Ilona Sorrel M.D.   On: 11/28/2014 10:56    EKG: Will order  Assessment/Plan Principal Problem:   Chest pain -She apparently has had a PE in the remote past-I see no imaging reports in our computer system that reveal a diagnosis of PE -She was here in March of this year for chest pain as well with the suspicion that she had a PE-a VQ scan was done at the time and found to be normal - A d-dimer has been done today and is normal -On exam, pain is essentially in her right breast tissue-no breast masses noted on exam- -have recommended to continue Tylenol  3 and have ordered Voltaren gel -EKG not obtained in ER therefore will obtain one now- we'll check 3 sets of troponin as well -If above is negative, I do not feel any further workup is needed  Active Problems: Shortness of breath on exertion/pedal edema -BNP only mildly elevated at 134.9-pulse ox checked by myself on room air is about 95-96% -Continue home dose of Lasix - she is ready received 60 mg of IV Lasix in the ER -no crackles noted on exam, no wheezing or rhonchi either-no symptoms of cough -she does have pedal edema-we'll obtain a 2-D echo- apparently, she has a history of diastolic heart failure and is on Lasix at home however, her last echo in 2014 did not reveal either systolic or diastolic dysfunction  Essential hypertension -Resume ARB-resume propranolol at a  lower dose due to bradycardia    Bradycardia -Heart rate in the 40s and at times dropping down to 30s-we'll cut back on propranolol from 40 mg to 10 mg daily-she has already taken her morning dose    DM type 2 goal A1C below 7.5 -Resume 70/30 insulin but at lower dose as the patient states that she has a poor appetite -Continue Glucotrol  CKD 3 -stable    Morbid obesity Body mass index is 45.52 kg/(m^2).   Anxiety  -Continue BuSpar & Klonopin  H/o TIA -Continue Plavix  Hypothyroidism -Continue Synthroid    Hyperlipidemia with target LDL less than 130 - Continue statin     Consulted:   Code Status: Full code  Family Communication:   DVT Prophylaxis: Lovenox  Time spent: 18 minutes  [place], MD [facility]  If 7PM-7AM, please contact night-coverage [email or web address] 11/28/2014, 3:21 PM

## 2014-11-28 NOTE — ED Notes (Signed)
Pt placed on heart monitor and cont pulse ox

## 2014-11-28 NOTE — ED Notes (Signed)
Pt reports 2 week history of upper back pain localized to both sides of spine that radiates down back, also reports cough, with green sputum that reminds her of past Pneumonia dx. Pt states she is really worried that this may be a PE because she has shortness of breath and this feels similar to past PE diagnosis. Pt also reports she has spinal stenosis.

## 2014-11-29 DIAGNOSIS — N183 Chronic kidney disease, stage 3 (moderate): Secondary | ICD-10-CM

## 2014-11-29 DIAGNOSIS — I5032 Chronic diastolic (congestive) heart failure: Secondary | ICD-10-CM | POA: Diagnosis not present

## 2014-11-29 DIAGNOSIS — R0789 Other chest pain: Principal | ICD-10-CM

## 2014-11-29 DIAGNOSIS — E1122 Type 2 diabetes mellitus with diabetic chronic kidney disease: Secondary | ICD-10-CM

## 2014-11-29 DIAGNOSIS — R079 Chest pain, unspecified: Secondary | ICD-10-CM | POA: Diagnosis not present

## 2014-11-29 DIAGNOSIS — R072 Precordial pain: Secondary | ICD-10-CM | POA: Diagnosis not present

## 2014-11-29 DIAGNOSIS — R001 Bradycardia, unspecified: Secondary | ICD-10-CM | POA: Diagnosis not present

## 2014-11-29 DIAGNOSIS — I10 Essential (primary) hypertension: Secondary | ICD-10-CM

## 2014-11-29 LAB — BRAIN NATRIURETIC PEPTIDE: B Natriuretic Peptide: 74.1 pg/mL (ref 0.0–100.0)

## 2014-11-29 LAB — GLUCOSE, CAPILLARY
Glucose-Capillary: 259 mg/dL — ABNORMAL HIGH (ref 65–99)
Glucose-Capillary: 233 mg/dL — ABNORMAL HIGH (ref 65–99)
Glucose-Capillary: 203 mg/dL — ABNORMAL HIGH (ref 65–99)
Glucose-Capillary: 193 mg/dL — ABNORMAL HIGH (ref 65–99)
Glucose-Capillary: 206 mg/dL — ABNORMAL HIGH (ref 65–99)

## 2014-11-29 LAB — BASIC METABOLIC PANEL
Anion gap: 8 (ref 5–15)
BUN: 20 mg/dL (ref 6–20)
CO2: 27 mmol/L (ref 22–32)
Calcium: 8.5 mg/dL — ABNORMAL LOW (ref 8.9–10.3)
Chloride: 98 mmol/L — ABNORMAL LOW (ref 101–111)
Creatinine, Ser: 1.59 mg/dL — ABNORMAL HIGH (ref 0.44–1.00)
GFR calc Af Amer: 35 mL/min — ABNORMAL LOW (ref >60)
GFR calc non Af Amer: 30 mL/min — ABNORMAL LOW (ref >60)
Glucose, Bld: 300 mg/dL — ABNORMAL HIGH (ref 65–99)
Potassium: 3.9 mmol/L (ref 3.5–5.1)
Sodium: 133 mmol/L — ABNORMAL LOW (ref 135–145)

## 2014-11-29 LAB — CBC
HCT: 33.3 % — ABNORMAL LOW (ref 36.0–46.0)
Hemoglobin: 10.7 g/dL — ABNORMAL LOW (ref 12.0–15.0)
MCH: 31.1 pg (ref 26.0–34.0)
MCHC: 32.1 g/dL (ref 30.0–36.0)
MCV: 96.8 fL (ref 78.0–100.0)
Platelets: 227 10*3/uL (ref 150–400)
RBC: 3.44 MIL/uL — ABNORMAL LOW (ref 3.87–5.11)
RDW: 12.8 % (ref 11.5–15.5)
WBC: 6.1 10*3/uL (ref 4.0–10.5)

## 2014-11-29 LAB — TROPONIN I: Troponin I: <0.03 ng/mL (ref <0.031)

## 2014-11-29 NOTE — Progress Notes (Signed)
[facility] PROGRESS NOTE  Tara Jackson [id number] DOB: January 16, 1941 DOA: 11/28/2014  PCP: [name], MD  Brief HPI: 74 year old African-American female with a past medical history of DVT, TIA, hypothyroidism, diabetes on insulin, diastolic heart failure and sleep apnea, presented with chest pain ongoing for 2 weeks. She was concerned that she had primary embolism. Evaluation in the ED revealed a normal d-dimer. She was hospitalized to rule out cardiac etiology for her symptoms.  Past medical history:  Past Medical History  Diagnosis Date  . DVT (deep venous thrombosis) ([place])   . Asthma   . Hypothyroidism   . Anxiety   . Depression   . H/O hiatal hernia   . Arthritis   . Anemia   . PE (pulmonary thromboembolism) (HCC) x 3, last 14 years ago    history of recurrent RLE DVT with PE - last PE ~>13 yrs; Maintained on Plavix  . Hyperlipidemia   . Diabetes mellitus type 2, insulin dependent ([place])   . Hypertension associated with diabetes ([place])   . Glaucoma   . Cataract   . Diabetic neuropathy ([place])   . Palpitations 35years ago    Cardionet monitor - revealed mostly normal sinus rhythm, sinus bradycardia with first-degree A-V block heart rates mostly in the 50s and 60s with some 70s. No arrhythmias, PVCs or PACs noted.  Marland Kitchen TIA (transient ischemic attack) March 2014  . Diastolic dysfunction, left ventricle     mild LVH, EF 55% by echo; no ischemia by stress 01/2010 echo [facility])  . Stroke [facility]) 04-19-2013    tia and stroke. Weakness Rt hand  . Sleep apnea     cpap disontinued; test done 07/14/2008 ordered per  Byrum  . Sleep apnea   . Restless legs syndrome   . Spinal stenosis     L 3, L 4 and L 5, and C 1, C 2 and C3  . Complication of anesthesia     hard to wake up after anesthesia, trouble turning head  . Spinal headache   . Memory loss 06/03/2014    Consultants: Cardiology  Procedures:   Echocardiogram is pending  Antibiotics: None  Subjective: Patient  continues to have on and off pressure-like sensation in the retrosternal area. Worsens with activity. Denies any shortness of breath or palpitations currently. Symptoms have been ongoing for 2 weeks. She does admit to a history of GERD. However, symptoms are well controlled with Nexium. Husband is at bedside.  Objective: Vital Signs  Filed Vitals:   11/28/14 1405 11/28/14 1951 11/28/14 2330 11/29/14 0517  BP: 174/82 183/75 140/42 97/58  Pulse: 45 48 51 68  Temp: 98 F (36.7 C) 97.6 F (36.4 C)  97.9 F (36.6 C)  TempSrc: Oral Oral  Oral  Resp: 18 18 18 18   Height: 5\' 7"  (1.702 m)     Weight: 131.861 kg (290 lb 11.2 oz)   130.273 kg (287 lb 3.2 oz)  SpO2: 99% 98% 98% 98%    Intake/Output Summary (Last 24 hours) at 11/29/14 0932 Last data filed at 11/28/14 2300  Gross per 24 hour  Intake    480 ml  Output   1350 ml  Net   -870 ml   Filed Weights   11/28/14 0950 11/28/14 1405 11/29/14 0517  Weight: 135.172 kg (298 lb) 131.861 kg (290 lb 11.2 oz) 130.273 kg (287 lb 3.2 oz)    General appearance: alert, cooperative, no distress and moderately obese Resp: Slightly diminished air entry at the  bases but no crackles, wheezing or rhonchi. Cardio: regular rate and rhythm, S1, S2 normal, no murmur, click, rub or gallop GI: soft, non-tender; bowel sounds normal; no masses,  no organomegaly Extremities: Minimal edema bilaterally Neurologic: Alert and oriented 3. No focal neurological deficits noted.  Lab Results:  Basic Metabolic Panel:  Recent Labs Lab 11/28/14 1030 11/28/14 1610 11/29/14 0345  NA 138  --  133*  K 4.5  --  3.9  CL 104  --  98*  CO2 27  --  27  GLUCOSE 229*  --  300*  BUN 19  --  20  CREATININE 1.48* 1.56* 1.59*  CALCIUM 8.7*  --  8.5*   Liver Function Tests:  Recent Labs Lab 11/28/14 1030  AST 21  ALT 10*  ALKPHOS 65  BILITOT 0.5  PROT 6.7  ALBUMIN 3.5   CBC:  Recent Labs Lab 11/28/14 1030 11/28/14 1610 11/29/14 0345  WBC 6.2 6.6 6.1    NEUTROABS 3.7  --   --   HGB 10.8* 10.9* 10.7*  HCT 33.6* 34.2* 33.3*  MCV 97.7 96.1 96.8  PLT 245 236 227   Cardiac Enzymes:  Recent Labs Lab 11/28/14 1030 11/28/14 1610 11/28/14 2129 11/29/14 0345  TROPONINI <0.03 <0.03 <0.03 <0.03   BNP (last 3 results)  Recent Labs  04/19/14 1447 11/28/14 1030 11/29/14 0345  BNP 74.6 134.9* 74.1    CBG:  Recent Labs Lab 11/28/14 1528 11/28/14 2335 11/29/14 0623  GLUCAP 221* 274* 259*    No results found for this or any previous visit (from the past 240 hour(s)).    Studies/Results: Dg Chest 2 View  11/28/2014  CLINICAL DATA:  Cough EXAM: CHEST  2 VIEW COMPARISON:  04/19/2014 chest radiograph FINDINGS: Stable cardiomediastinal silhouette with mild cardiomegaly. No pneumothorax. No pleural effusion. There is mild pulmonary edema. Surgical clip is seen in the upper abdomen. IMPRESSION: Mild congestive heart failure. Electronically Signed   By: Ilona Sorrel M.D.   On: 11/28/2014 10:56    Medications:  Scheduled: . aspirin EC  81 mg Oral Daily  . busPIRone  10 mg Oral q morning - 10a  . cholecalciferol  400 Units Oral Daily  . clopidogrel  75 mg Oral q morning - 10a  . diclofenac sodium  2 g Topical QID  . enoxaparin (LOVENOX) injection  40 mg Subcutaneous Q24H  . furosemide  40 mg Oral Daily  . glipiZIDE  20 mg Oral QAC breakfast  . insulin aspart  3 Units Subcutaneous TID WC  . insulin NPH Human  20 Units Subcutaneous QAC breakfast  . irbesartan  150 mg Oral Daily  . l-methylfolate-B6-B12  1 tablet Oral Daily  . levothyroxine  100 mcg Oral q morning - 10a  . Linaclotide  290 mcg Oral q morning - 10a  . nortriptyline  50 mg Oral QHS  . pantoprazole  40 mg Oral Daily  . potassium chloride  20 mEq Oral Daily  . rosuvastatin  20 mg Oral q morning - 10a  . sucralfate  1 g Oral Daily   Continuous:  [id number], acetaminophen-codeine, clonazePAM, dicyclomine, nitroGLYCERIN, ondansetron (ZOFRAN)  IV  Assessment/Plan:  Principal Problem:   Chest pain Active Problems:   Hyperlipidemia with target LDL less than 130   Edema of both legs   CHF (congestive heart failure) (HCC)   Bradycardia   DM type 2 goal A1C below 7.5   Morbid obesity ([place])   CKD stage 3 due to type 2 diabetes mellitus (  [place])    Chest pain Patient has ruled out for acute coronary syndrome. With a normal d-dimer, pulmonary embolism, has effectively been ruled out as well. EKG did not show any ischemic changes. Her last stress test was in 2013 which was negative. However, patient's symptoms are concerning. She is very anxious and continues to have symptoms. She has not seen her cardiologist in over a year. She will benefit from inpatient cardiology consultation and possibly even a stress test. Continue aspirin. Continue statin.  Exertional dyspnea Unclear etiology. Could have something to do with her weight. Continue Lasix for now. Echocardiogram is pending.  Bradycardia Sinus rhythm noted on EKG. She is noted to be on propranolol. This will be discontinued for now.  History of essential hypertension Monitor blood pressures closely. Continue current medications.  Chronic kidney disease stage III Renal function is stable. Monitor urine output.  Morbid obesity BMI is 45.  History of anxiety Continue her medications  History of TIA Continue Plavix  History of hypothyroidism Continue Synthroid  DVT Prophylaxis: On Lovenox    Code Status: Full code  Family Communication: Discussed with the patient and her husband  Disposition Plan: Await cardiology input. Await echocardiogram.     LOS: 1 day   [facility] Pager [phone] 11/29/2014, 9:32 AM  If 7PM-7AM, please contact night-coverage at [email or web address], password [facility]

## 2014-11-29 NOTE — Consult Note (Signed)
Reason for Consult: Chest pain  Requesting Physician: Tara Jackson  Cardiologist: Tara Jackson  HPI: This is a 74 y.o. female with a past medical history significant for recurrent remote pulmonary embolism presents with right sided chest pain and exertional dyspnea. The pain occurs at rest and is not suggestive of typical angina. D-dimer, cardiac enzymes and ECG are normal. She has morbid obesity, DM type 2, HTN and moderate renal insufficiency. Normal nuclear stress test in 2013. Normal EF and wall motion on echo on current admission.  PMHx:  Past Medical History  Diagnosis Date  . DVT (deep venous thrombosis) ([place])   . Asthma   . Hypothyroidism   . Anxiety   . Depression   . H/O hiatal hernia   . Arthritis   . Anemia   . PE (pulmonary thromboembolism) (HCC) x 3, last 14 years ago    history of recurrent RLE DVT with PE - last PE ~>13 yrs; Maintained on Plavix  . Hyperlipidemia   . Diabetes mellitus type 2, insulin dependent ([place])   . Hypertension associated with diabetes ([place])   . Glaucoma   . Cataract   . Diabetic neuropathy ([place])   . Palpitations 35years ago    Cardionet monitor - revealed mostly normal sinus rhythm, sinus bradycardia with first-degree A-V block heart rates mostly in the 50s and 60s with some 70s. No arrhythmias, PVCs or PACs noted.  Marland Kitchen TIA (transient ischemic attack) March 2014  . Diastolic dysfunction, left ventricle     mild LVH, EF 55% by echo; no ischemia by stress 01/2010 echo Tara Jackson)  . Stroke [name]) 04-19-2013    tia and stroke. Weakness Rt hand  . Sleep apnea     cpap disontinued; test done 07/14/2008 ordered per  Tara Jackson  . Sleep apnea   . Restless legs syndrome   . Spinal stenosis     L 3, L 4 and L 5, and C 1, C 2 and C3  . Complication of anesthesia     hard to wake up after anesthesia, trouble turning head  . Spinal headache   . Memory loss 06/03/2014   Past Surgical History  Procedure Laterality Date  . Abdominal hysterectomy    .  Cholecystectomy    . Back surgery      steroid inj  . Tonsillectomy    . Appendectomy    . Esophagogastroduodenoscopy  08/09/2011    Procedure: ESOPHAGOGASTRODUODENOSCOPY (EGD);  Surgeon: Tara Columbia, MD;  Location: [facility];  Service: [facility];  Laterality: N/A;  . Hot hemostasis  08/09/2011    Procedure: HOT HEMOSTASIS (ARGON PLASMA COAGULATION/BICAP);  Surgeon: Tara Columbia, MD;  Location: [facility];  Service: [facility];  Laterality: N/A;  . Trigger finger release  11/22/2011    Procedure: RELEASE TRIGGER FINGER/A-1 PULLEY;  Surgeon: Tara Pel, MD;  Location: Tara [facility];  Service: Orthopedics;  Laterality: Left;  Left trigger thumb release  . Cardiac catheterization  02/14/2012    no evidence of obstructive coronary disease ,low EDP with normal EF  . Doppler echocardiography  04/19/2012    EF 0000000; normal diastolic pressures, no regional wall motion motion abnormalities  . Nm myocar perf wall motion  01/26/2012    EF 79%,LV normal,ST SEGMENT CHANGE SUGGESTIVE OF ISCHEMIA.  Tara [facility] doppler  05/29/2012    RIGHT EXTREM. NORMAL VENOUS DUPLEX  . Joint replacement      right knee x 1, left knee x 2  . Left knee spacer bar placement  done with 2nd left knee replacement  . Eye surgery Bilateral 4-5-yrs ago  . Right trigger finger release  yrs ago  . Esophagogastroduodenoscopy (egd) with propofol N/A 11/12/2013    Procedure: ESOPHAGOGASTRODUODENOSCOPY (EGD) WITH PROPOFOL;  Surgeon: Tara Columbia, MD;  Location: Tara [facility];  Service: [facility];  Laterality: N/A;  . Hot hemostasis N/A 11/12/2013    Procedure: HOT HEMOSTASIS (ARGON PLASMA COAGULATION/BICAP);  Surgeon: Tara Columbia, MD;  Location: [facility];  Service: [facility];  Laterality: N/A;  . Left heart catheterization with coronary angiogram N/A 02/14/2012    Procedure: LEFT HEART CATHETERIZATION WITH CORONARY ANGIOGRAM;  Surgeon: Tara Man, MD;  Location: Tara [facility] CATH LAB;  Service: Cardiovascular;  Laterality: N/A;     FAMHx: Family History  Problem Relation Age of Onset  . Cancer Mother   . Cancer - Prostate Father     SOCHx:  reports that she has never smoked. She has never used smokeless tobacco. She reports that she does not drink alcohol or use illicit drugs.  ALLERGIES: Allergies  Allergen Reactions  . Iodine Other (See Comments)    ONLY IV--Makes unconscious  . Metoclopramide Hcl Other (See Comments)    suicidal  . Sulfa Antibiotics Hives  . Iohexol      Code: SOB, Desc: cardiac arrest w/ iv contrast, has never used 13 hr prep//Tara Jackson, Onset Date: [id number]   . Lactose Intolerance (Gi) Diarrhea  . Lansoprazole Nausea And Vomiting    ROS: Pertinent items noted in HPI and remainder of comprehensive ROS otherwise negative.  HOME MEDICATIONS: Prescriptions prior to admission  Medication Sig Dispense Refill Last Dose  . busPIRone (BUSPAR) 10 MG tablet Take 1 tablet (10 mg total) by mouth every morning. 60 tablet 1 11/28/2014 at Unknown time  . Cholecalciferol (VITAMIN D-3 PO) Take 1 tablet by mouth every morning.    11/28/2014 at Unknown time  . clonazePAM (KLONOPIN) 0.5 MG tablet Take 0.5-1 mg by mouth 2 (two) times daily. 1 mg (2 tablets) in the morning and 0.5 mg (1 tablet) at night   11/28/2014 at Unknown time  . clopidogrel (PLAVIX) 75 MG tablet Take 75 mg by mouth every morning.   11/28/2014 at Unknown time  . dicyclomine (BENTYL) 10 MG capsule Take 10 mg by mouth 3 (three) times daily as needed for spasms.    11/26/2014  . esomeprazole (NEXIUM) 40 MG capsule Take 40 mg by mouth daily before breakfast.   11/28/2014 at Unknown time  . furosemide (LASIX) 40 MG tablet Take 40 mg by mouth.   11/28/2014 at Unknown time  . glipiZIDE (GLUCOTROL) 10 MG tablet Take 20 mg by mouth 2 (two) times daily before a meal.    11/28/2014 at Unknown time  . insulin NPH (HUMULIN N,NOVOLIN N) 100 UNIT/ML injection Inject 30 Units into the skin daily before breakfast. Novolin-N   11/28/2014 at  Unknown time  . insulin regular (NOVOLIN R,HUMULIN R) 100 units/mL injection Inject 0.05 mLs (5 Units total) into the skin 3 (three) times daily before meals.   11/28/2014 at Unknown time  . l-methylfolate-B6-B12 (METANX) 3-35-2 MG TABS tablet Take 1 tablet by mouth daily.   11/28/2014 at Unknown time  . levothyroxine (SYNTHROID, LEVOTHROID) 100 MCG tablet Take 100 mcg by mouth every morning.    11/28/2014 at Unknown time  . lidocaine (LIDODERM) 5 % Place 3 patches onto the skin daily. Remove & Discard patch within 12 hours or as directed by MD   11/27/2014 at Unknown time  .  Linaclotide (LINZESS) 290 MCG CAPS capsule Take 290 mcg by mouth every morning.    11/28/2014 at Unknown time  . LORazepam (ATIVAN) 0.5 MG tablet TK 1 T PO AS NEEDED FOR ANXIETY ATTACK  0 PRN  . nortriptyline (PAMELOR) 50 MG capsule Take 50 mg by mouth at bedtime.   11/27/2014 at Unknown time  . potassium chloride (MICRO-K) 10 MEQ CR capsule Take 10 mEq by mouth daily as needed (leg cramps).    11/27/2014 at Unknown time  . propranolol (INDERAL) 40 MG tablet Take 1 tablet (40 mg total) by mouth every morning. 30 tablet 0 11/28/2014 at 0900  . rosuvastatin (CRESTOR) 20 MG tablet Take 20 mg by mouth every morning.    11/28/2014 at Unknown time  . sucralfate (CARAFATE) 1 G tablet Take 1-2 g by mouth daily.    11/28/2014 at Unknown time  . tiZANidine (ZANAFLEX) 4 MG tablet TAKE 1 TABLET BY MOUTH EVERY NIGHT AT BEDTIME FOR 1 WEEK, THEN INCREASE TO 1 TABLET BY MOUTH TWICE DAILY THERAFTER 180 tablet 0 3 weeks  . valsartan (DIOVAN) 160 MG tablet Take 160 mg by mouth daily.   11/28/2014 at Unknown time  . albuterol (PROVENTIL HFA;VENTOLIN HFA) 108 (90 BASE) MCG/ACT inhaler Inhale 2 puffs into the lungs every 6 (six) hours as needed for wheezing.   Taking  . meclizine (ANTIVERT) 12.5 MG tablet Take 12.5 mg by mouth 3 (three) times daily as needed for dizziness.    Taking  . polyethylene glycol (MIRALAX / GLYCOLAX) packet Take 17 g by  mouth daily as needed for mild constipation.   Taking  . Sennosides (EX-LAX PO) Take 1 tablet by mouth daily as needed (constipation).    Taking  . terconazole (TERAZOL 7) 0.4 % vaginal cream    Taking    [facility] MEDICATIONS: Scheduled: . aspirin EC  81 mg Oral Daily  . busPIRone  10 mg Oral q morning - 10a  . cholecalciferol  400 Units Oral Daily  . clopidogrel  75 mg Oral q morning - 10a  . diclofenac sodium  2 g Topical QID  . enoxaparin (LOVENOX) injection  40 mg Subcutaneous Q24H  . furosemide  40 mg Oral Daily  . glipiZIDE  20 mg Oral QAC breakfast  . insulin aspart  3 Units Subcutaneous TID WC  . insulin NPH Human  20 Units Subcutaneous QAC breakfast  . irbesartan  150 mg Oral Daily  . l-methylfolate-B6-B12  1 tablet Oral Daily  . levothyroxine  100 mcg Oral q morning - 10a  . Linaclotide  290 mcg Oral q morning - 10a  . nortriptyline  50 mg Oral QHS  . pantoprazole  40 mg Oral Daily  . potassium chloride  20 mEq Oral Daily  . rosuvastatin  20 mg Oral q morning - 10a  . sucralfate  1 g Oral Daily   Continuous:   VITALS: Blood pressure 166/55, pulse 50, temperature 98.4 F (36.9 C), temperature source Oral, resp. rate 20, height 5\' 7"  (1.702 m), weight 287 lb 3.2 oz (130.273 kg), SpO2 96 %.  PHYSICAL EXAM:  General: Alert, oriented x3, no distress, exam limited by obesity Head: no evidence of trauma, PERRL, EOMI, no exophtalmos or lid lag, no myxedema, no xanthelasma; normal ears, nose and oropharynx Neck: probably normal jugular venous pulsations and no hepatojugular reflux; brisk carotid pulses without delay and no carotid bruits Chest: clear to auscultation, no signs of consolidation by percussion or palpation, normal fremitus, symmetrical and full respiratory  excursions Cardiovascular: unable to locate the apical impulse, regular rhythm, normal first heart sound and  second heart sound, no rubs or gallops, no murmur Abdomen: no tenderness or distention, no masses by  palpation, no abnormal pulsatility or arterial bruits, normal bowel sounds, no hepatosplenomegaly Extremities: no clubbing, cyanosis;  no edema; 2+ radial, ulnar and brachial pulses bilaterally; 2+ right femoral, posterior tibial and dorsalis pedis pulses; 2+ left femoral, posterior tibial and dorsalis pedis pulses; no subclavian or femoral bruits Neurological: grossly nonfocal   LABS  CBC  Recent Labs  11/28/14 1030 11/28/14 1610 11/29/14 0345  WBC 6.2 6.6 6.1  NEUTROABS 3.7  --   --   HGB 10.8* 10.9* 10.7*  HCT 33.6* 34.2* 33.3*  MCV 97.7 96.1 96.8  PLT 245 236 [id number]   Basic Metabolic Panel  Recent Labs  11/28/14 1030 11/28/14 1610 11/29/14 0345  NA 138  --  133*  K 4.5  --  3.9  CL 104  --  98*  CO2 27  --  27  GLUCOSE 229*  --  300*  BUN 19  --  20  CREATININE 1.48* 1.56* 1.59*  CALCIUM 8.7*  --  8.5*   Liver Function Tests  Recent Labs  11/28/14 1030  AST 21  ALT 10*  ALKPHOS 65  BILITOT 0.5  PROT 6.7  ALBUMIN 3.5   No results for input(s): LIPASE, AMYLASE in the last 72 hours. Cardiac Enzymes  Recent Labs  11/28/14 1610 11/28/14 2129 11/29/14 0345  TROPONINI <0.03 <0.03 <0.03   BNP Invalid input(s): POCBNP D-Dimer  Recent Labs  11/28/14 1030  DDIMER <0.27   Hemoglobin A1C No results for input(s): HGBA1C in the last 72 hours. Fasting Lipid Panel No results for input(s): CHOL, HDL, LDLCALC, TRIG, CHOLHDL, LDLDIRECT in the last 72 hours. Thyroid Function Tests No results for input(s): TSH, T4TOTAL, T3FREE, THYROIDAB in the last 72 hours.  Invalid input(s): FREET3    IMAGING: Dg Chest 2 View  11/28/2014  CLINICAL DATA:  Cough EXAM: CHEST  2 VIEW COMPARISON:  04/19/2014 chest radiograph FINDINGS: Stable cardiomediastinal silhouette with mild cardiomegaly. No pneumothorax. No pleural effusion. There is mild pulmonary edema. Surgical clip is seen in the upper abdomen. IMPRESSION: Mild congestive heart failure. Electronically Signed   By:  Ilona Sorrel M.D.   On: 11/28/2014 10:56    ECG: Sinus brady, no repol changes  TELEMETRY: NSR  IMPRESSION: 1. Atypical chest pain, numerous coronary risk factors  RECOMMENDATION: 1. Lexiscan Myoview in AM. If normal, no other cardiac w/u is planned.  Time Spent Directly with Patient: 30 minutes  Sanda Klein, MD, [facility] [phone] office [phone] pager   11/29/2014, 3:39 PM

## 2014-11-30 ENCOUNTER — Observation Stay (HOSPITAL_COMMUNITY): Payer: Medicare HMO

## 2014-11-30 DIAGNOSIS — R072 Precordial pain: Secondary | ICD-10-CM

## 2014-11-30 DIAGNOSIS — I5032 Chronic diastolic (congestive) heart failure: Secondary | ICD-10-CM | POA: Diagnosis not present

## 2014-11-30 DIAGNOSIS — I119 Hypertensive heart disease without heart failure: Secondary | ICD-10-CM | POA: Diagnosis not present

## 2014-11-30 DIAGNOSIS — R0789 Other chest pain: Secondary | ICD-10-CM | POA: Diagnosis not present

## 2014-11-30 DIAGNOSIS — I519 Heart disease, unspecified: Secondary | ICD-10-CM

## 2014-11-30 DIAGNOSIS — R079 Chest pain, unspecified: Secondary | ICD-10-CM | POA: Diagnosis not present

## 2014-11-30 DIAGNOSIS — IMO0001 Reserved for inherently not codable concepts without codable children: Secondary | ICD-10-CM | POA: Insufficient documentation

## 2014-11-30 DIAGNOSIS — N183 Chronic kidney disease, stage 3 (moderate): Secondary | ICD-10-CM | POA: Diagnosis not present

## 2014-11-30 DIAGNOSIS — E1122 Type 2 diabetes mellitus with diabetic chronic kidney disease: Secondary | ICD-10-CM | POA: Diagnosis not present

## 2014-11-30 DIAGNOSIS — R001 Bradycardia, unspecified: Secondary | ICD-10-CM | POA: Diagnosis not present

## 2014-11-30 HISTORY — DX: Heart disease, unspecified: I51.9

## 2014-11-30 LAB — GLUCOSE, CAPILLARY
Glucose-Capillary: 262 mg/dL — ABNORMAL HIGH (ref 65–99)
Glucose-Capillary: 274 mg/dL — ABNORMAL HIGH (ref 65–99)

## 2014-11-30 MED ORDER — TECHNETIUM TC 99M SESTAMIBI GENERIC - CARDIOLITE
30.0000 | Freq: Once | INTRAVENOUS | Status: AC | PRN
  Administered 2014-11-30: 30 via INTRAVENOUS

## 2014-11-30 MED ORDER — REGADENOSON 0.4 MG/5ML IV SOLN
INTRAVENOUS | Status: AC
  Filled 2014-11-30: qty 5

## 2014-11-30 MED ORDER — PROPRANOLOL HCL 20 MG PO TABS: 20.0000 mg | ORAL_TABLET | Freq: Every morning | ORAL | Status: DC

## 2014-11-30 MED ORDER — PROPRANOLOL HCL 40 MG PO TABS
20.0000 mg | ORAL_TABLET | Freq: Every morning | ORAL | Status: DC
Start: 2014-11-30 — End: 2014-11-30

## 2014-11-30 MED ORDER — ACETAMINOPHEN 325 MG PO TABS
ORAL_TABLET | ORAL | Status: AC
  Filled 2014-11-30: qty 2

## 2014-11-30 MED ORDER — REGADENOSON 0.4 MG/5ML IV SOLN
0.4000 mg | Freq: Once | INTRAVENOUS | Status: AC
  Administered 2014-11-30: 0.4 mg via INTRAVENOUS

## 2014-11-30 MED ORDER — DICLOFENAC SODIUM 1 % TD GEL: 2.0000 g | Freq: Three times a day (TID) | TRANSDERMAL | Status: DC | PRN

## 2014-11-30 NOTE — Progress Notes (Signed)
Subjective:  No chest pain  Objective:  Vital Signs in the last 24 hours: Temp:  [97.6 F (36.4 C)-98.4 F (36.9 C)] 98 F (36.7 C) (10/23 0341) Pulse Rate:  [50-86] 74 (10/23 1021) Resp:  [18-20] 18 (10/23 0341) BP: (144-166)/(50-86) 148/61 mmHg (10/23 1021) SpO2:  [96 %-98 %] 96 % (10/23 0341) Weight:  [286 lb 3.2 oz (129.819 kg)] 286 lb 3.2 oz (129.819 kg) (10/23 0341)  Intake/Output from previous day:  Intake/Output Summary (Last 24 hours) at 11/30/14 1028 Last data filed at 11/30/14 0600  Gross per 24 hour  Intake    700 ml  Output    775 ml  Net    -75 ml    Physical Exam: General appearance: alert, cooperative, no distress and morbidly obese Lungs: clear to auscultation bilaterally Heart: regular rate and rhythm Extremities: extremities normal, atraumatic, no cyanosis or edema   Rate: 65  Rhythm: normal sinus rhythm and premature ventricular contractions (PVC) with a sinus pause  Lab Results:  Recent Labs  11/28/14 1610 11/29/14 0345  WBC 6.6 6.1  HGB 10.9* 10.7*  PLT 236 227    Recent Labs  11/28/14 1030 11/28/14 1610 11/29/14 0345  NA 138  --  133*  K 4.5  --  3.9  CL 104  --  98*  CO2 27  --  27  GLUCOSE 229*  --  300*  BUN 19  --  20  CREATININE 1.48* 1.56* 1.59*    Recent Labs  11/28/14 2129 11/29/14 0345  TROPONINI <0.03 <0.03   No results for input(s): INR in the last 72 hours.  Scheduled Meds: . aspirin EC  81 mg Oral Daily  . busPIRone  10 mg Oral q morning - 10a  . cholecalciferol  400 Units Oral Daily  . clopidogrel  75 mg Oral q morning - 10a  . diclofenac sodium  2 g Topical QID  . enoxaparin (LOVENOX) injection  40 mg Subcutaneous Q24H  . furosemide  40 mg Oral Daily  . glipiZIDE  20 mg Oral QAC breakfast  . insulin aspart  3 Units Subcutaneous TID WC  . insulin NPH Human  20 Units Subcutaneous QAC breakfast  . irbesartan  150 mg Oral Daily  . l-methylfolate-B6-B12  1 tablet Oral Daily  . levothyroxine  100 mcg  Oral q morning - 10a  . Linaclotide  290 mcg Oral q morning - 10a  . nortriptyline  50 mg Oral QHS  . pantoprazole  40 mg Oral Daily  . potassium chloride  20 mEq Oral Daily  . regadenoson      . rosuvastatin  20 mg Oral q morning - 10a  . sucralfate  1 g Oral Daily   Continuous Infusions:  PRN Meds:.acetaminophen, acetaminophen-codeine, clonazePAM, dicyclomine, nitroGLYCERIN, ondansetron (ZOFRAN) IV   Imaging: Imaging results have been reviewed  Cardiac Studies:  Assessment/Plan:   Principal Problem:   Chest pain Active Problems:   Hyperlipidemia with target LDL less than 130   Edema of both legs   CHF (congestive heart failure) (HCC)   Bradycardia   DM type 2 goal A1C below 7.5   Morbid obesity ([place])   CKD stage 3 due to type 2 diabetes mellitus ([place])   PLAN: Lexiscan Myoview this am- stress today, rest tomorrow if stress is abnormal.   Kerin Ransom PA-C 11/30/2014, 10:28 AM [phone]  I have seen and examined the patient along with Kerin Ransom PA-C.  I have reviewed the chart, notes  and new data.  I agree with PA's note.  Key new complaints: no pain today Key examination changes: no change Key new findings / data: for Myoview today  PLAN: Will try to have stress only images reviewed, because if they are normal, we can forego rest images tomorrow.  Sanda Klein, MD, [place] [phone] 11/30/2014, 11:33 AM

## 2014-11-30 NOTE — Discharge Summary (Signed)
[facility]  Physician Discharge Summary   Patient ID: Tara Jackson MRN: [id number] DOB/AGE: 07-31-1940 74 y.o.  Admit date: 11/28/2014 Discharge date: 11/30/2014  PCP: [name], MD  DISCHARGE DIAGNOSES:  Principal Problem:   Chest pain Active Problems:   Hyperlipidemia with target LDL less than 130   Edema of both legs   CHF (congestive heart failure) (HCC)   Bradycardia   DM type 2 goal A1C below 7.5   Morbid obesity ([place])   CKD stage 3 due to type 2 diabetes mellitus ([place])   RECOMMENDATIONS FOR OUTPATIENT FOLLOW UP: 1. Patient complains of pain in the right breast area. This will need outpatient follow-up. 2. Dose of propranolol has been reduced due to bradycardia.   DISCHARGE CONDITION: fair  Diet recommendation: Modified carbohydrate  Filed Weights   11/28/14 1405 11/29/14 0517 11/30/14 0341  Weight: 131.861 kg (290 lb 11.2 oz) 130.273 kg (287 lb 3.2 oz) 129.819 kg (286 lb 3.2 oz)    INITIAL HISTORY: 74 year old African-American female with a past medical history of DVT, TIA, hypothyroidism, diabetes on insulin, diastolic heart failure and sleep apnea, presented with chest pain ongoing for 2 weeks. She was concerned that she had primary embolism. Evaluation in the ED revealed a normal d-dimer. She was hospitalized to rule out cardiac etiology for her symptoms.  Consultations:  Cardiology  Procedures: Echocardiogram Study Conclusions - Left ventricle: The cavity size was normal. Systolic function wasnormal. The estimated ejection fraction was in the range of 55%to 60%. Wall motion was normal; there were no regional wallmotion abnormalities. Doppler parameters are consistent withabnormal left ventricular relaxation (grade 1 diastolicdysfunction). Impressions:- Compared to the prior study, there has been no significantinterval change.  Nuclear stress test Preliminary report suggests no evidence for ischemia.  HOSPITAL COURSE:   Chest  pain Patient has ruled out for acute coronary syndrome. With a normal d-dimer, pulmonary embolism, has effectively been ruled out as well. EKG did not show any ischemic changes. Her last stress test was in 2013 which was negative. Patient was very anxious. Due to her risk factors, cardiology was consulted. She underwent stress test and echocardiogram. Both are low risk. She is also concerned about pain in her right breast and thinks that she has a cyst in that area. I have asked her to follow-up with her primary care physician for the same.   Exertional dyspnea Unclear etiology. Could have something to do with her weight. Continue Lasix for now. Echocardiogram revealed normal systolic function.  Bradycardia Sinus rhythm noted on EKG. She is noted to be on propranolol. Dose will be decreased at the time of discharge. Discussed with cardiology.  History of essential hypertension Blood pressure is stable. She may continue home medications.   Chronic kidney disease stage III Renal function is stable.   Morbid obesity BMI is 45.  History of anxiety Continue her medications  History of TIA Continue Plavix  History of hypothyroidism Continue Synthroid  Overall, stable. Okay for discharge.  PERTINENT LABS:  The results of significant diagnostics from this hospitalization (including imaging, microbiology, ancillary and laboratory) are listed below for reference.     Labs: Basic Metabolic Panel:  Recent Labs Lab 11/28/14 1030 11/28/14 1610 11/29/14 0345  NA 138  --  133*  K 4.5  --  3.9  CL 104  --  98*  CO2 27  --  27  GLUCOSE 229*  --  300*  BUN 19  --  20  CREATININE 1.48* 1.56* 1.59*  CALCIUM 8.7*  --  8.5*   Liver Function Tests:  Recent Labs Lab 11/28/14 1030  AST 21  ALT 10*  ALKPHOS 65  BILITOT 0.5  PROT 6.7  ALBUMIN 3.5   CBC:  Recent Labs Lab 11/28/14 1030 11/28/14 1610 11/29/14 0345  WBC 6.2 6.6 6.1  NEUTROABS 3.7  --   --   HGB 10.8* 10.9*  10.7*  HCT 33.6* 34.2* 33.3*  MCV 97.7 96.1 96.8  PLT 245 236 227   Cardiac Enzymes:  Recent Labs Lab 11/28/14 1030 11/28/14 1610 11/28/14 2129 11/29/14 0345  TROPONINI <0.03 <0.03 <0.03 <0.03   BNP: BNP (last 3 results)  Recent Labs  04/19/14 1447 11/28/14 1030 11/29/14 0345  BNP 74.6 134.9* 74.1    CBG:  Recent Labs Lab 11/29/14 1632 11/29/14 1943 11/29/14 2056 11/30/14 0617 11/30/14 1236  GLUCAP 203* 193* 206* 262* 274*     IMAGING STUDIES Dg Chest 2 View  11/28/2014  CLINICAL DATA:  Cough EXAM: CHEST  2 VIEW COMPARISON:  04/19/2014 chest radiograph FINDINGS: Stable cardiomediastinal silhouette with mild cardiomegaly. No pneumothorax. No pleural effusion. There is mild pulmonary edema. Surgical clip is seen in the upper abdomen. IMPRESSION: Mild congestive heart failure. Electronically Signed   By: Ilona Sorrel M.D.   On: 11/28/2014 10:56   Nm Myocar Single W/spect W/wall Motion And Ef  11/30/2014  CLINICAL DATA:  Chest pain, palpitations, hypertension, diabetes mellitus, elevated triglycerides, TIA EXAM: MYOCARDIAL IMAGING WITH SPECT (PHARMACOLOGIC-STRESS) GATED LEFT VENTRICULAR WALL MOTION STUDY LEFT VENTRICULAR EJECTION FRACTION TECHNIQUE: Intravenous infusion of Lexiscan was performed under the supervision of the Cardiology staff. At peak effect of the drug, 10 mCi Tc-47m sestamibi was injected intravenously and standard myocardial SPECT imaging was performed. Quantitative gated imaging was also performed to evaluate left ventricular wall motion, and estimate left ventricular ejection fraction. Resting exam was not performed. COMPARISON:  None FINDINGS: Perfusion: Minimal breast attenuation artifact. No focal myocardial perfusion defects. Wall Motion: Normal left ventricular wall motion. No left ventricular dilation. Left Ventricular Ejection Fraction: 60 % End diastolic volume 82 ml End systolic volume 33 ml IMPRESSION: 1. No significant myocardial perfusion  defects identified. 2. Normal left ventricular wall motion. 3. Left ventricular ejection fraction 60% 4. Low-risk stress test findings*. *2012 Appropriate Use Criteria for Coronary Revascularization Focused Update: J Am Coll Cardiol. [id number]. [email or web address] Electronically Signed   By: Lavonia Dana M.D.   On: 11/30/2014 13:52    DISCHARGE EXAMINATION: Filed Vitals:   11/30/14 1029 11/30/14 1031 11/30/14 1033 11/30/14 1238  BP: 176/86 153/68 145/65 157/78  Pulse: 73 83 80 74  Temp:    97.6 F (36.4 C)  TempSrc:    Oral  Resp:    18  Height:      Weight:      SpO2:    96%   General appearance: alert, cooperative, appears stated age and no distress Resp: clear to auscultation bilaterally Cardio: regular rate and rhythm, S1, S2 normal, no murmur, click, rub or gallop GI: soft, non-tender; bowel sounds normal; no masses,  no organomegaly Extremities: extremities normal, atraumatic, no cyanosis or edema   DISPOSITION: Home  Discharge Instructions    Call MD for:  difficulty breathing, headache or visual disturbances    Complete by:  As directed      Call MD for:  extreme fatigue    Complete by:  As directed      Call MD for:  persistant dizziness or light-headedness    Complete by:  As directed      Call MD for:  persistant nausea and vomiting    Complete by:  As directed      Call MD for:  severe uncontrolled pain    Complete by:  As directed      Diet Carb Modified    Complete by:  As directed      Discharge instructions    Complete by:  As directed   Please note that we have decreased the dose of your Propranolol due to low heart rate. Dr. Allison Quarry office will call you for follow-up appointment. Please see your primary care provider to discuss pain in the right breast and for posthospitalization follow-up.  You were cared for by a hospitalist during your hospital stay. If you have any questions about your discharge  medications or the care you received while you were in the hospital after you are discharged, you can call the unit and asked to speak with the hospitalist on call if the hospitalist that took care of you is not available. Once you are discharged, your primary care physician will handle any further medical issues. Please note that NO REFILLS for any discharge medications will be authorized once you are discharged, as it is imperative that you return to your primary care physician (or establish a relationship with a primary care physician if you do not have one) for your aftercare needs so that they can reassess your need for medications and monitor your lab values. If you do not have a primary care physician, you can call [phone] for a physician referral.     Increase activity slowly    Complete by:  As directed            ALLERGIES:  Allergies  Allergen Reactions  . Iodine Other (See Comments)    ONLY IV--Makes unconscious  . Metoclopramide Hcl Other (See Comments)    suicidal  . Sulfa Antibiotics Hives  . Iohexol      Code: SOB, Desc: cardiac arrest w/ iv contrast, has never used 13 hr prep//alice calhoun, Onset Date: [id number]   . Lactose Intolerance (Gi) Diarrhea  . Lansoprazole Nausea And Vomiting     Current Discharge Medication List    START taking these medications   Details  diclofenac sodium (VOLTAREN) 1 % GEL Apply 2 g topically 3 (three) times daily as needed (to affected area). Qty: 1 Tube, Refills: 0      CONTINUE these medications which have CHANGED   Details  propranolol (INDERAL) 40 MG tablet Take 0.5 tablets (20 mg total) by mouth every morning. Starting 12/02/14 Qty: 30 tablet, Refills: 0      CONTINUE these medications which have NOT CHANGED   Details  busPIRone (BUSPAR) 10 MG tablet Take 1 tablet (10 mg total) by mouth every morning. Qty: 60 tablet, Refills: 1    Cholecalciferol (VITAMIN D-3 PO) Take 1 tablet by mouth every morning.     clonazePAM  (KLONOPIN) 0.5 MG tablet Take 0.5-1 mg by mouth 2 (two) times daily. 1 mg (2 tablets) in the morning and 0.5 mg (1 tablet) at night    clopidogrel (PLAVIX) 75 MG tablet Take 75 mg by mouth every morning.    dicyclomine (BENTYL) 10 MG capsule Take 10 mg by mouth 3 (three) times daily as needed for spasms.     esomeprazole (NEXIUM) 40 MG capsule Take 40 mg by mouth daily before breakfast.    furosemide (LASIX) 40 MG tablet Take 40 mg by mouth.  glipiZIDE (GLUCOTROL) 10 MG tablet Take 20 mg by mouth 2 (two) times daily before a meal.     insulin NPH (HUMULIN N,NOVOLIN N) 100 UNIT/ML injection Inject 30 Units into the skin daily before breakfast. Novolin-N    insulin regular (NOVOLIN R,HUMULIN R) 100 units/mL injection Inject 0.05 mLs (5 Units total) into the skin 3 (three) times daily before meals.    l-methylfolate-B6-B12 (METANX) 3-35-2 MG TABS tablet Take 1 tablet by mouth daily.    levothyroxine (SYNTHROID, LEVOTHROID) 100 MCG tablet Take 100 mcg by mouth every morning.     lidocaine (LIDODERM) 5 % Place 3 patches onto the skin daily. Remove & Discard patch within 12 hours or as directed by MD    Linaclotide (LINZESS) 290 MCG CAPS capsule Take 290 mcg by mouth every morning.     LORazepam (ATIVAN) 0.5 MG tablet TK 1 T PO AS NEEDED FOR ANXIETY ATTACK Refills: 0    nortriptyline (PAMELOR) 50 MG capsule Take 50 mg by mouth at bedtime.    potassium chloride (MICRO-K) 10 MEQ CR capsule Take 10 mEq by mouth daily as needed (leg cramps).     rosuvastatin (CRESTOR) 20 MG tablet Take 20 mg by mouth every morning.     sucralfate (CARAFATE) 1 G tablet Take 1-2 g by mouth daily.     tiZANidine (ZANAFLEX) 4 MG tablet TAKE 1 TABLET BY MOUTH EVERY NIGHT AT BEDTIME FOR 1 WEEK, THEN INCREASE TO 1 TABLET BY MOUTH TWICE DAILY THERAFTER Qty: 180 tablet, Refills: 0    valsartan (DIOVAN) 160 MG tablet Take 160 mg by mouth daily.    albuterol (PROVENTIL HFA;VENTOLIN HFA) 108 (90 BASE) MCG/ACT  inhaler Inhale 2 puffs into the lungs every 6 (six) hours as needed for wheezing.    meclizine (ANTIVERT) 12.5 MG tablet Take 12.5 mg by mouth 3 (three) times daily as needed for dizziness.     polyethylene glycol (MIRALAX / GLYCOLAX) packet Take 17 g by mouth daily as needed for mild constipation.    Sennosides (EX-LAX PO) Take 1 tablet by mouth daily as needed (constipation).     terconazole (TERAZOL 7) 0.4 % vaginal cream       STOP taking these medications     acetaminophen-codeine (TYLENOL #3) 300-30 MG per tablet        Follow-up Information    Follow up with [name], MD. Schedule an appointment as soon as possible for a visit in 1 week.   Specialty:  Internal Medicine   Why:  post hospitalization follow up   Contact information:   [address] [place] [address] [phone]       [place]: 16 minutes  [facility] Pager [phone]  11/30/2014, 2:02 PM

## 2014-11-30 NOTE — Progress Notes (Addendum)
I received word that Dr Thornton Papas has reviewed stress imgaes and there is no significant ischemia, EF 56%.  Kerin Ransom PA-C 11/30/2014 1:27 PM Patient seen and examined and history reviewed. Agree with above findings and plan. No further ischemic work up needed. OK for DC today. Would resume propranolol at lower dose given severe bradycardia noted on admission.  Tara Jackson, [place] 11/30/2014 1:55 PM

## 2014-12-04 NOTE — ED Provider Notes (Addendum)
CSN: [id number]     Arrival date & time 11/28/14  [id number] History   First MD Initiated Contact with Patient 11/28/14 1014     Chief Complaint  Patient presents with  . Shortness of Breath     (Consider location/radiation/quality/duration/timing/severity/associated sxs/prior Treatment) HPI  Expand All Collapse All   Pt reports 2 week history of upper back pain localized to both sides of spine that radiates down back, also reports cough, with green sputum that reminds her of past Pneumonia dx. Pt states she is really worried that this may be a PE because she has shortness of breath and this feels similar to past PE diagnosis. Pt also reports she has spinal stenosis.        Past Medical History  Diagnosis Date  . DVT (deep venous thrombosis) ([place])   . Asthma   . Hypothyroidism   . Anxiety   . Depression   . H/O hiatal hernia   . Arthritis   . Anemia   . PE (pulmonary thromboembolism) ([facility]) x 3, last 14 years ago    history of recurrent RLE DVT with PE - last PE ~>13 yrs; Maintained on Plavix  . Hyperlipidemia   . Diabetes mellitus type 2, insulin dependent ([place])   . Hypertension associated with diabetes ([place])   . Glaucoma   . Cataract   . Diabetic neuropathy ([place])   . Palpitations 35years ago    Cardionet monitor - revealed mostly normal sinus rhythm, sinus bradycardia with first-degree A-V block heart rates mostly in the 50s and 60s with some 70s. No arrhythmias, PVCs or PACs noted.  Marland Kitchen TIA (transient ischemic attack) March 2014  . Diastolic dysfunction, left ventricle     mild LVH, EF 55% by echo; no ischemia by stress 01/2010 echo [facility])  . Stroke [facility]) 04-19-2013    tia and stroke. Weakness Rt hand  . Sleep apnea     cpap disontinued; test done 07/14/2008 ordered per  [place]  . Sleep apnea   . Restless legs syndrome   . Spinal stenosis     L 3, L 4 and L 5, and C 1, C 2 and C3  . Complication of anesthesia     hard to wake up after anesthesia, trouble turning head  .  Spinal headache   . Memory loss 06/03/2014   Past Surgical History  Procedure Laterality Date  . Abdominal hysterectomy    . Cholecystectomy    . Back surgery      steroid inj  . Tonsillectomy    . Appendectomy    . Esophagogastroduodenoscopy  08/09/2011    Procedure: ESOPHAGOGASTRODUODENOSCOPY (EGD);  Surgeon: Jeryl Columbia, MD;  Location: [facility];  Service: Endoscopy;  Laterality: N/A;  . Hot hemostasis  08/09/2011    Procedure: HOT HEMOSTASIS (ARGON PLASMA COAGULATION/BICAP);  Surgeon: Jeryl Columbia, MD;  Location: [facility];  Service: Endoscopy;  Laterality: N/A;  . Trigger finger release  11/22/2011    Procedure: RELEASE TRIGGER FINGER/A-1 PULLEY;  Surgeon: Meredith Pel, MD;  Location: [place];  Service: Orthopedics;  Laterality: Left;  Left trigger thumb release  . Cardiac catheterization  02/14/2012    no evidence of obstructive coronary disease ,low EDP with normal EF  . Doppler echocardiography  04/19/2012    EF 0000000; normal diastolic pressures, no regional wall motion motion abnormalities  . Nm myocar perf wall motion  01/26/2012    EF 79%,LV normal,ST SEGMENT CHANGE SUGGESTIVE OF ISCHEMIA.  Eusebio Me doppler  05/29/2012    RIGHT EXTREM. NORMAL VENOUS DUPLEX  . Joint replacement      right knee x 1, left knee x 2  . Left knee spacer bar placement  done with 2nd left knee replacement  . Eye surgery Bilateral 4-5-yrs ago  . Right trigger finger release  yrs ago  . Esophagogastroduodenoscopy (egd) with propofol N/A 11/12/2013    Procedure: ESOPHAGOGASTRODUODENOSCOPY (EGD) WITH PROPOFOL;  Surgeon: Jeryl Columbia, MD;  Location: [facility];  Service: Endoscopy;  Laterality: N/A;  . Hot hemostasis N/A 11/12/2013    Procedure: HOT HEMOSTASIS (ARGON PLASMA COAGULATION/BICAP);  Surgeon: Jeryl Columbia, MD;  Location: [facility];  Service: Endoscopy;  Laterality: N/A;  . Left heart catheterization with coronary angiogram N/A 02/14/2012    Procedure: LEFT HEART CATHETERIZATION WITH  CORONARY ANGIOGRAM;  Surgeon: Leonie Man, MD;  Location: [facility] CATH LAB;  Service: Cardiovascular;  Laterality: N/A;   Family History  Problem Relation Age of Onset  . Cancer Mother   . Cancer - Prostate Father    Social History  Substance Use Topics  . Smoking status: Never Smoker   . Smokeless tobacco: Never Used  . Alcohol Use: No   OB History    No data available     Review of Systems  All other systems reviewed and are negative.     Allergies  Iodine; Metoclopramide hcl; Sulfa antibiotics; Iohexol; Lactose intolerance (gi); and Lansoprazole  Home Medications   Prior to Admission medications   Medication Sig Start Date End Date Taking? Authorizing Provider  busPIRone (BUSPAR) 10 MG tablet Take 1 tablet (10 mg total) by mouth every morning. 06/03/14  Yes Garvin Fila, MD  Cholecalciferol (VITAMIN D-3 PO) Take 1 tablet by mouth every morning.    Yes Historical Provider, MD  clonazePAM (KLONOPIN) 0.5 MG tablet Take 0.5-1 mg by mouth 2 (two) times daily. 1 mg (2 tablets) in the morning and 0.5 mg (1 tablet) at night   Yes Historical Provider, MD  clopidogrel (PLAVIX) 75 MG tablet Take 75 mg by mouth every morning.   Yes Historical Provider, MD  dicyclomine (BENTYL) 10 MG capsule Take 10 mg by mouth 3 (three) times daily as needed for spasms.    Yes Historical Provider, MD  esomeprazole (NEXIUM) 40 MG capsule Take 40 mg by mouth daily before breakfast.   Yes Historical Provider, MD  furosemide (LASIX) 40 MG tablet Take 40 mg by mouth.   Yes Historical Provider, MD  glipiZIDE (GLUCOTROL) 10 MG tablet Take 20 mg by mouth 2 (two) times daily before a meal.  05/03/14  Yes Historical Provider, MD  insulin NPH (HUMULIN N,NOVOLIN N) 100 UNIT/ML injection Inject 30 Units into the skin daily before breakfast. Novolin-N   Yes Historical Provider, MD  insulin regular (NOVOLIN R,HUMULIN R) 100 units/mL injection Inject 0.05 mLs (5 Units total) into the skin 3 (three) times daily before  meals. 04/21/14  Yes Modena Jansky, MD  l-methylfolate-B6-B12 (METANX) 3-35-2 MG TABS tablet Take 1 tablet by mouth daily.   Yes Historical Provider, MD  levothyroxine (SYNTHROID, LEVOTHROID) 100 MCG tablet Take 100 mcg by mouth every morning.    Yes Historical Provider, MD  lidocaine (LIDODERM) 5 % Place 3 patches onto the skin daily. Remove & Discard patch within 12 hours or as directed by MD   Yes Historical Provider, MD  Linaclotide (LINZESS) 290 MCG CAPS capsule Take 290 mcg by mouth every morning.    Yes Historical Provider, MD  LORazepam (ATIVAN) 0.5 MG tablet TK 1 T PO AS NEEDED FOR ANXIETY ATTACK 09/10/14  Yes Historical Provider, MD  nortriptyline (PAMELOR) 50 MG capsule Take 50 mg by mouth at bedtime.   Yes Historical Provider, MD  potassium chloride (MICRO-K) 10 MEQ CR capsule Take 10 mEq by mouth daily as needed (leg cramps).    Yes Historical Provider, MD  rosuvastatin (CRESTOR) 20 MG tablet Take 20 mg by mouth every morning.    Yes Historical Provider, MD  sucralfate (CARAFATE) 1 G tablet Take 1-2 g by mouth daily.    Yes Historical Provider, MD  tiZANidine (ZANAFLEX) 4 MG tablet TAKE 1 TABLET BY MOUTH EVERY NIGHT AT BEDTIME FOR 1 WEEK, THEN INCREASE TO 1 TABLET BY MOUTH TWICE DAILY THERAFTER 10/07/14  Yes Ward Givens, NP  valsartan (DIOVAN) 160 MG tablet Take 160 mg by mouth daily.   Yes Historical Provider, MD  albuterol (PROVENTIL HFA;VENTOLIN HFA) 108 (90 BASE) MCG/ACT inhaler Inhale 2 puffs into the lungs every 6 (six) hours as needed for wheezing.    Historical Provider, MD  diclofenac sodium (VOLTAREN) 1 % GEL Apply 2 g topically 3 (three) times daily as needed (to affected area). 11/30/14   Bonnielee Haff, MD  meclizine (ANTIVERT) 12.5 MG tablet Take 12.5 mg by mouth 3 (three) times daily as needed for dizziness.     Historical Provider, MD  polyethylene glycol (MIRALAX / GLYCOLAX) packet Take 17 g by mouth daily as needed for mild constipation.    Historical Provider, MD   propranolol (INDERAL) 20 MG tablet Take 1 tablet (20 mg total) by mouth every morning. Starting 12/02/14 11/30/14   Bonnielee Haff, MD  Sennosides (EX-LAX PO) Take 1 tablet by mouth daily as needed (constipation).     Historical Provider, MD  terconazole (TERAZOL 7) 0.4 % vaginal cream  04/18/14   Historical Provider, MD   BP 157/78 mmHg  Pulse 74  Temp(Src) 97.6 F (36.4 C) (Oral)  Resp 18  Ht 5\' 7"  (1.702 m)  Wt 286 lb 3.2 oz (129.819 kg)  BMI 44.81 kg/m2  SpO2 96% Physical Exam  Constitutional: She is oriented to person, place, and time. She appears well-developed and well-nourished. No distress.  HENT:  Head: Normocephalic and atraumatic.  Eyes: Pupils are equal, round, and reactive to light.  Neck: Normal range of motion.  Cardiovascular: Normal rate and intact distal pulses.   Pulmonary/Chest: No respiratory distress. She has no wheezes. She has no rales.  Abdominal: Normal appearance. She exhibits no distension. There is no tenderness. There is no rebound.  Musculoskeletal: Normal range of motion.  Neurological: She is alert and oriented to person, place, and time. No cranial nerve deficit.  Skin: Skin is warm and dry. No rash noted.  Psychiatric: She has a normal mood and affect. Her behavior is normal.  Nursing note and vitals reviewed.   ED Course  Procedures (including critical care time) Medications  ondansetron (ZOFRAN) injection 4 mg (not administered)  furosemide (LASIX) injection 60 mg (60 mg Intravenous Given 11/28/14 1202)  regadenoson (LEXISCAN) injection SOLN 0.4 mg (0.4 mg Intravenous Given 11/30/14 1015)  technetium sestamibi generic (CARDIOLITE) injection 30 milli Curie (30 milli Curies Intravenous Contrast Given 11/30/14 1029)      Labs Review   Imaging Review . Results for orders placed or performed during the hospital encounter of 11/28/14  CBC with Differential  Result Value Ref Range   WBC 6.2 4.0 - 10.5 K/uL   RBC 3.44 (L) 3.87 -  5.11  MIL/uL   Hemoglobin 10.8 (L) 12.0 - 15.0 g/dL   HCT 33.6 (L) 36.0 - 46.0 %   MCV 97.7 78.0 - 100.0 fL   MCH 31.4 26.0 - 34.0 pg   MCHC 32.1 30.0 - 36.0 g/dL   RDW 12.3 11.5 - 15.5 %   Platelets 245 150 - 400 K/uL   Neutrophils Relative % 58 %   Neutro Abs 3.7 1.7 - 7.7 K/uL   Lymphocytes Relative 31 %   Lymphs Abs 1.9 0.7 - 4.0 K/uL   Monocytes Relative 7 %   Monocytes Absolute 0.4 0.1 - 1.0 K/uL   Eosinophils Relative 3 %   Eosinophils Absolute 0.2 0.0 - 0.7 K/uL   Basophils Relative 1 %   Basophils Absolute 0.0 0.0 - 0.1 K/uL  Comprehensive metabolic panel  Result Value Ref Range   Sodium 138 135 - 145 mmol/L   Potassium 4.5 3.5 - 5.1 mmol/L   Chloride 104 101 - 111 mmol/L   CO2 27 22 - 32 mmol/L   Glucose, Bld 229 (H) 65 - 99 mg/dL   BUN 19 6 - 20 mg/dL   Creatinine, Ser 1.48 (H) 0.44 - 1.00 mg/dL   Calcium 8.7 (L) 8.9 - 10.3 mg/dL   Total Protein 6.7 6.5 - 8.1 g/dL   Albumin 3.5 3.5 - 5.0 g/dL   AST 21 15 - 41 U/L   ALT 10 (L) 14 - 54 U/L   Alkaline Phosphatase 65 38 - 126 U/L   Total Bilirubin 0.5 0.3 - 1.2 mg/dL   GFR calc non Af Amer 33 (L) >60 mL/min   GFR calc Af Amer 38 (L) >60 mL/min   Anion gap 7 5 - 15  Troponin I  Result Value Ref Range   Troponin I <0.03 <0.031 ng/mL  Urinalysis, Routine w reflex microscopic (not at [facility])  Result Value Ref Range   Color, Urine ORANGE (A) YELLOW   APPearance CLOUDY (A) CLEAR   Specific Gravity, Urine 1.015 1.005 - 1.030   pH 5.5 5.0 - 8.0   Glucose, UA NEGATIVE NEGATIVE mg/dL   Hgb urine dipstick NEGATIVE NEGATIVE   Bilirubin Urine NEGATIVE NEGATIVE   Ketones, ur NEGATIVE NEGATIVE mg/dL   Protein, ur NEGATIVE NEGATIVE mg/dL   Urobilinogen, UA 1.0 0.0 - 1.0 mg/dL   Nitrite POSITIVE (A) NEGATIVE   Leukocytes, UA NEGATIVE NEGATIVE  Urine microscopic-add on  Result Value Ref Range   Squamous Epithelial / LPF MANY (A) RARE   RBC / HPF 0-2 <3 RBC/hpf   Bacteria, UA MANY (A) RARE  D-dimer, quantitative (not at [facility])   Result Value Ref Range   D-Dimer, Quant <0.27 0.00 - 0.48 ug/mL-FEU  Brain natriuretic peptide  Result Value Ref Range   B Natriuretic Peptide 134.9 (H) 0.0 - 100.0 pg/mL  Glucose, capillary  Result Value Ref Range   Glucose-Capillary 221 (H) 65 - 99 mg/dL  CBC  Result Value Ref Range   WBC 6.6 4.0 - 10.5 K/uL   RBC 3.56 (L) 3.87 - 5.11 MIL/uL   Hemoglobin 10.9 (L) 12.0 - 15.0 g/dL   HCT 34.2 (L) 36.0 - 46.0 %   MCV 96.1 78.0 - 100.0 fL   MCH 30.6 26.0 - 34.0 pg   MCHC 31.9 30.0 - 36.0 g/dL   RDW 12.7 11.5 - 15.5 %   Platelets 236 150 - 400 K/uL  Creatinine, serum  Result Value Ref Range   Creatinine, Ser 1.56 (H) 0.44 - 1.00 mg/dL  GFR calc non Af Amer 31 (L) >60 mL/min   GFR calc Af Amer 36 (L) >60 mL/min  Troponin I (q 6hr x 3)  Result Value Ref Range   Troponin I <0.03 <0.031 ng/mL  Troponin I (q 6hr x 3)  Result Value Ref Range   Troponin I <0.03 <0.031 ng/mL  Troponin I (q 6hr x 3)  Result Value Ref Range   Troponin I <0.03 <0.031 ng/mL  Basic metabolic panel  Result Value Ref Range   Sodium 133 (L) 135 - 145 mmol/L   Potassium 3.9 3.5 - 5.1 mmol/L   Chloride 98 (L) 101 - 111 mmol/L   CO2 27 22 - 32 mmol/L   Glucose, Bld 300 (H) 65 - 99 mg/dL   BUN 20 6 - 20 mg/dL   Creatinine, Ser 1.59 (H) 0.44 - 1.00 mg/dL   Calcium 8.5 (L) 8.9 - 10.3 mg/dL   GFR calc non Af Amer 30 (L) >60 mL/min   GFR calc Af Amer 35 (L) >60 mL/min   Anion gap 8 5 - 15  CBC  Result Value Ref Range   WBC 6.1 4.0 - 10.5 K/uL   RBC 3.44 (L) 3.87 - 5.11 MIL/uL   Hemoglobin 10.7 (L) 12.0 - 15.0 g/dL   HCT 33.3 (L) 36.0 - 46.0 %   MCV 96.8 78.0 - 100.0 fL   MCH 31.1 26.0 - 34.0 pg   MCHC 32.1 30.0 - 36.0 g/dL   RDW 12.8 11.5 - 15.5 %   Platelets 227 150 - 400 K/uL  Brain natriuretic peptide  Result Value Ref Range   B Natriuretic Peptide 74.1 0.0 - 100.0 pg/mL  Glucose, capillary  Result Value Ref Range   Glucose-Capillary 274 (H) 65 - 99 mg/dL  Glucose, capillary  Result Value  Ref Range   Glucose-Capillary 259 (H) 65 - 99 mg/dL  Glucose, capillary  Result Value Ref Range   Glucose-Capillary 233 (H) 65 - 99 mg/dL   Comment 1 Notify RN   Glucose, capillary  Result Value Ref Range   Glucose-Capillary 203 (H) 65 - 99 mg/dL   Comment 1 Notify RN   Glucose, capillary  Result Value Ref Range   Glucose-Capillary 193 (H) 65 - 99 mg/dL  Glucose, capillary  Result Value Ref Range   Glucose-Capillary 206 (H) 65 - 99 mg/dL  Glucose, capillary  Result Value Ref Range   Glucose-Capillary 262 (H) 65 - 99 mg/dL  Glucose, capillary  Result Value Ref Range   Glucose-Capillary 274 (H) 65 - 99 mg/dL   Dg Chest 2 View  11/28/2014  CLINICAL DATA:  Cough EXAM: CHEST  2 VIEW COMPARISON:  04/19/2014 chest radiograph FINDINGS: Stable cardiomediastinal silhouette with mild cardiomegaly. No pneumothorax. No pleural effusion. There is mild pulmonary edema. Surgical clip is seen in the upper abdomen. IMPRESSION: Mild congestive heart failure. Electronically Signed   By: Ilona Sorrel M.D.   On: 11/28/2014 10:56        I have personally reviewed and evaluated these images and lab results as part of my medical decision-making. Text Interpretation:      CRITICAL CARE Performed by: Dot Lanes Total critical care time: 30 min Critical care time was exclusive of separately billable procedures and treating other patients. Critical care was necessary to treat or prevent imminent or life-threatening deterioration. Critical care was time spent personally by me on the following activities: development of treatment plan with patient and/or surrogate as well as nursing, discussions with consultants, evaluation  of patient's response to treatment, examination of patient, obtaining history from patient or surrogate, ordering and performing treatments and interventions, ordering and review of laboratory studies, ordering and review of radiographic studies, pulse oximetry and re-evaluation  of patient's condition.  Patient was admitted with congestive heart failure. MDM   Final diagnoses:  Congestive heart failure, unspecified congestive heart failure chronicity, unspecified congestive heart failure type [facility])        Leonard Schwartz, MD 12/04/14 1149  Leonard Schwartz, MD 12/04/14 1149

## 2014-12-10 DIAGNOSIS — G4736 Sleep related hypoventilation in conditions classified elsewhere: Secondary | ICD-10-CM | POA: Diagnosis not present

## 2014-12-10 DIAGNOSIS — G4733 Obstructive sleep apnea (adult) (pediatric): Secondary | ICD-10-CM | POA: Diagnosis not present

## 2014-12-11 ENCOUNTER — Ambulatory Visit (HOSPITAL_COMMUNITY)
Admission: RE | Admit: 2014-12-11 | Discharge: 2014-12-11 | Disposition: A | Discharge status: Home / Self Care | Payer: Medicare HMO | Source: Ambulatory Visit | Attending: Internal Medicine | Admitting: Internal Medicine

## 2014-12-11 ENCOUNTER — Encounter (HOSPITAL_COMMUNITY): Payer: Self-pay

## 2014-12-11 DIAGNOSIS — M810 Age-related osteoporosis without current pathological fracture: Secondary | ICD-10-CM | POA: Diagnosis not present

## 2014-12-11 HISTORY — DX: Age-related osteoporosis without current pathological fracture: M81.0

## 2014-12-11 MED ORDER — ZOLEDRONIC ACID 5 MG/100ML IV SOLN
5.0000 mg | Freq: Once | INTRAVENOUS | Status: AC
  Administered 2014-12-11: 5 mg via INTRAVENOUS
  Filled 2014-12-11: qty 100

## 2014-12-11 MED ORDER — SODIUM CHLORIDE 0.9 % IV SOLN
Freq: Once | INTRAVENOUS | Status: AC
  Administered 2014-12-11: 250 mL via INTRAVENOUS

## 2014-12-11 NOTE — Progress Notes (Signed)
Uneventful 1st infusion of RECLAST. Pt stated at the end of the infusion she "felt flushed" but she had been wrapped in warm blankets. These were removed and after 10 minutes she stated she was no longer flushed. VSS. Pt was discharged via w/c accompanied by staff and husband to [place] parking

## 2014-12-11 NOTE — Discharge Instructions (Signed)
Drink  Fluids/water as tolerated over the next 72 hours Tylenol or ibuprofen OTC as directed Continue Calcium and Vit D as directed by your Dr Teena Irani Zoledronic Acid injection (Paget's Disease, Osteoporosis) What is this medicine? ZOLEDRONIC ACID (ZOE le dron ik AS id) lowers the amount of calcium loss from bone. It is used to treat Paget's disease and osteoporosis in women. This medicine may be used for other purposes; ask your health care provider or pharmacist if you have questions. What should I tell my health care provider before I take this medicine? They need to know if you have any of these conditions: -aspirin-sensitive asthma -cancer, especially if you are receiving medicines used to treat cancer -dental disease or wear dentures -infection -kidney disease -low levels of calcium in the blood -past surgery on the parathyroid gland or intestines -receiving corticosteroids like dexamethasone or prednisone -an unusual or allergic reaction to zoledronic acid, other medicines, foods, dyes, or preservatives -pregnant or trying to get pregnant -breast-feeding How should I use this medicine? This medicine is for infusion into a vein. It is given by a health care professional in a hospital or clinic setting. Talk to your pediatrician regarding the use of this medicine in children. This medicine is not approved for use in children. Overdosage: If you think you have taken too much of this medicine contact a poison control center or emergency room at once. NOTE: This medicine is only for you. Do not share this medicine with others. What if I miss a dose? It is important not to miss your dose. Call your doctor or health care professional if you are unable to keep an appointment. What may interact with this medicine? -certain antibiotics given by injection -NSAIDs, medicines for pain and inflammation, like ibuprofen or naproxen -some diuretics like bumetanide,  furosemide -teriparatide This list may not describe all possible interactions. Give your health care provider a list of all the medicines, herbs, non-prescription drugs, or dietary supplements you use. Also tell them if you smoke, drink alcohol, or use illegal drugs. Some items may interact with your medicine. What should I watch for while using this medicine? Visit your doctor or health care professional for regular checkups. It may be some time before you see the benefit from this medicine. Do not stop taking your medicine unless your doctor tells you to. Your doctor may order blood tests or other tests to see how you are doing. Women should inform their doctor if they wish to become pregnant or think they might be pregnant. There is a potential for serious side effects to an unborn child. Talk to your health care professional or pharmacist for more information. You should make sure that you get enough calcium and vitamin D while you are taking this medicine. Discuss the foods you eat and the vitamins you take with your health care professional. Some people who take this medicine have severe bone, joint, and/or muscle pain. This medicine may also increase your risk for jaw problems or a broken thigh bone. Tell your doctor right away if you have severe pain in your jaw, bones, joints, or muscles. Tell your doctor if you have any pain that does not go away or that gets worse. Tell your dentist and dental surgeon that you are taking this medicine. You should not have major dental surgery while on this medicine. See your dentist to have a dental exam and fix any dental problems before starting this medicine. Take good care of  your teeth while on this medicine. Make sure you see your dentist for regular follow-up appointments. What side effects may I notice from receiving this medicine? Side effects that you should report to your doctor or health care professional as soon as possible: -allergic reactions  like skin rash, itching or hives, swelling of the face, lips, or tongue -anxiety, confusion, or depression -breathing problems -changes in vision -eye pain -feeling faint or lightheaded, falls -jaw pain, especially after dental work -mouth sores -muscle cramps, stiffness, or weakness -redness, blistering, peeling or loosening of the skin, including inside the mouth -trouble passing urine or change in the amount of urine Side effects that usually do not require medical attention (report to your doctor or health care professional if they continue or are bothersome): -bone, joint, or muscle pain -constipation -diarrhea -fever -hair loss -irritation at site where injected -loss of appetite -nausea, vomiting -stomach upset -trouble sleeping -trouble swallowing -weak or tired This list may not describe all possible side effects. Call your doctor for medical advice about side effects. You may report side effects to FDA at 1-800-FDA-1088. Where should I keep my medicine? This drug is given in a hospital or clinic and will not be stored at home. NOTE: This sheet is a summary. It may not cover all possible information. If you have questions about this medicine, talk to your doctor, pharmacist, or health care provider.    2016, Elsevier/Gold Standard. (2013-06-22 14:19:57) Osteoporosis Osteoporosis is the thinning and loss of density in the bones. Osteoporosis makes the bones more brittle, fragile, and likely to break (fracture). Over time, osteoporosis can cause the bones to become so weak that they fracture after a simple fall. The bones most likely to fracture are the bones in the hip, wrist, and spine. CAUSES  The exact cause is not known. RISK FACTORS Anyone can develop osteoporosis. You may be at greater risk if you have a family history of the condition or have poor nutrition. You may also have a higher risk if you are:   Female.   21 years old or older.  A smoker.  Not  physically active.   White or Asian.  Slender. SIGNS AND SYMPTOMS  A fracture might be the first sign of the disease, especially if it results from a fall or injury that would not usually cause a bone to break. Other signs and symptoms include:   Low back and neck pain.  Stooped posture.  Height loss. DIAGNOSIS  To make a diagnosis, your health care provider may:  Take a medical history.  Perform a physical exam.  Order tests, such as:  A bone mineral density test.  A dual-energy X-ray absorptiometry test. TREATMENT  The goal of osteoporosis treatment is to strengthen your bones to reduce your risk of a fracture. Treatment may involve:  Making lifestyle changes, such as:  Eating a diet rich in calcium.  Doing weight-bearing and muscle-strengthening exercises.  Stopping tobacco use.  Limiting alcohol intake.  Taking medicine to slow the process of bone loss or to increase bone density.  Monitoring your levels of calcium and vitamin D. HOME CARE INSTRUCTIONS  Include calcium and vitamin D in your diet. Calcium is important for bone health, and vitamin D helps the body absorb calcium.  Perform weight-bearing and muscle-strengthening exercises as directed by your health care provider.  Do not use any tobacco products, including cigarettes, chewing tobacco, and electronic cigarettes. If you need help quitting, ask your health care provider.  Limit your  alcohol intake.  Take medicines only as directed by your health care provider.  Keep all follow-up visits as directed by your health care provider. This is important.  Take precautions at home to lower your risk of falling, such as:  Keeping rooms well lit and clutter free.  Installing safety rails on stairs.  Using rubber mats in the bathroom and other areas that are often wet or slippery. SEEK IMMEDIATE MEDICAL CARE IF:  You fall or injure yourself.    This information is not intended to replace advice  given to you by your health care provider. Make sure you discuss any questions you have with your health care provider.   Document Released: 11/03/2004 Document Revised: 02/14/2014 Document Reviewed: 07/04/2013 Elsevier Interactive Patient Education [employer].

## 2014-12-11 NOTE — Progress Notes (Signed)
Pt here today for her first RECLAST infusion. Pt took her tylenol at home prior to arrival and has been drinking water as instructed by Dr Keane Police office. Pt is taking Vit D supplement and states she is taking dietary calcium. Lab Calcium levels are WNL for this infusion. Pt was instructed after discharge to contact Dr Virgina Jock for questions or concerns. Pt and husband verbalized understanding

## 2014-12-19 DIAGNOSIS — N183 Chronic kidney disease, stage 3 (moderate): Secondary | ICD-10-CM | POA: Diagnosis not present

## 2014-12-19 DIAGNOSIS — I10 Essential (primary) hypertension: Secondary | ICD-10-CM | POA: Diagnosis not present

## 2014-12-19 DIAGNOSIS — I129 Hypertensive chronic kidney disease with stage 1 through stage 4 chronic kidney disease, or unspecified chronic kidney disease: Secondary | ICD-10-CM | POA: Diagnosis not present

## 2014-12-19 DIAGNOSIS — R001 Bradycardia, unspecified: Secondary | ICD-10-CM | POA: Diagnosis not present

## 2014-12-19 DIAGNOSIS — R0602 Shortness of breath: Secondary | ICD-10-CM | POA: Diagnosis not present

## 2014-12-19 DIAGNOSIS — G4736 Sleep related hypoventilation in conditions classified elsewhere: Secondary | ICD-10-CM | POA: Diagnosis not present

## 2014-12-19 DIAGNOSIS — E1149 Type 2 diabetes mellitus with other diabetic neurological complication: Secondary | ICD-10-CM | POA: Diagnosis not present

## 2014-12-19 DIAGNOSIS — Z6841 Body Mass Index (BMI) 40.0 and over, adult: Secondary | ICD-10-CM | POA: Diagnosis not present

## 2014-12-23 DIAGNOSIS — I509 Heart failure, unspecified: Secondary | ICD-10-CM | POA: Diagnosis not present

## 2014-12-23 DIAGNOSIS — I10 Essential (primary) hypertension: Secondary | ICD-10-CM | POA: Diagnosis not present

## 2015-01-08 ENCOUNTER — Telehealth: Payer: Self-pay | Admitting: Cardiology

## 2015-01-08 NOTE — Telephone Encounter (Signed)
Tara Jackson is calling because she state fluid is building up and her rate dropped to 30 on yesterday .Marland Kitchen Please call  Thanks

## 2015-01-08 NOTE — Telephone Encounter (Signed)
Returned call to patient.She stated she had a low heart rate yesterday 01/07/15- 30 BPM.Stated at present heart rate 55 BPM.Stated she feels like fluid in lungs,increase swelling in both feet.Stated she made appointment with Kerin Ransom PA 01/14/15.Stated she spoke to Dr.Russo's nurse and she told her to be seen sooner.Appointment scheduled with Cecilie Kicks NP tomorrow 12/2 at 11:30 am at [facility] office.Advised to go to ER if needed.

## 2015-01-09 ENCOUNTER — Ambulatory Visit
Admission: RE | Admit: 2015-01-09 | Discharge: 2015-01-09 | Disposition: A | Discharge status: Home / Self Care | Payer: Medicare HMO | Source: Ambulatory Visit | Attending: Cardiology | Admitting: Cardiology

## 2015-01-09 ENCOUNTER — Ambulatory Visit (INDEPENDENT_AMBULATORY_CARE_PROVIDER_SITE_OTHER): Payer: Medicare HMO | Admitting: Cardiology

## 2015-01-09 ENCOUNTER — Encounter: Payer: Self-pay | Admitting: Cardiology

## 2015-01-09 VITALS — BP 96/58 | HR 49 | Ht 67.0 in | Wt 287.8 lb

## 2015-01-09 DIAGNOSIS — R001 Bradycardia, unspecified: Secondary | ICD-10-CM

## 2015-01-09 DIAGNOSIS — I5041 Acute combined systolic (congestive) and diastolic (congestive) heart failure: Secondary | ICD-10-CM | POA: Diagnosis not present

## 2015-01-09 DIAGNOSIS — R600 Localized edema: Secondary | ICD-10-CM | POA: Diagnosis not present

## 2015-01-09 DIAGNOSIS — I519 Heart disease, unspecified: Secondary | ICD-10-CM

## 2015-01-09 DIAGNOSIS — I959 Hypotension, unspecified: Secondary | ICD-10-CM

## 2015-01-09 MED ORDER — VALSARTAN 160 MG PO TABS: 160.0000 mg | ORAL_TABLET | Freq: Every day | ORAL | Status: DC

## 2015-01-09 NOTE — Progress Notes (Signed)
Cardiology Office Note   Date:  01/09/2015   ID:  Jackson, Tara December 05, 1940, MRN [id number]  PCP:  Tara Reel, MD  Cardiologist:  Dr. Ellyn Hack    Chief Complaint  Patient presents with  . weakness    feels like she has fluid in right lung  . swollen feet  . low heart rate      History of Present Illness: Tara Jackson is a 74 y.o. female who presents for post hospital and bradycardia.  Hospitalized in Oct 2016 with chest pain nuc study was negative eith EF 60% and no ischemia.  Troponin was neg X 3.  She is on BB for tremor and with bradycardia in hospital this was decreased.    Her past medical history significant for recurrent remote pulmonary embolism presents with right sided chest pain and exertional dyspnea. The pain occurs at rest and is not suggestive of typical angina. D-dimer, cardiac enzymes and ECG are normal. She has morbid obesity, DM type 2, HTN and moderate renal insufficiency, CKD -3. Normal nuclear stress test in 2013. Normal EF and wall motion on echo on current admission.  She called yesterday due to HR in the 30 and low BP she felt she would go out of the world. + lightheaded.  She has decreased her diovan to 160 mg daily. and only took hal of that today.  Her BP is low.  She is afraid her heart will stop.  She complains about fluid in her lungs and she does have lower ext edema.  Her husband is with her today. At home she is in wheelchair- electronic but she can walk around the house holding onto furniture.  HR today is 49 and BP 96 systolic.  She is eating and drinking well at home.  No chest pain. During hospitalization her husband stated palliative care was mentioned - I asked them to take up with PCP I do not see notes of this recommendation.  She does mention that at times her HR is fast.    Past Medical History  Diagnosis Date  . DVT (deep venous thrombosis) ([place])   . Asthma   . Hypothyroidism   . Anxiety   . Depression   . H/O hiatal hernia    . Arthritis   . Anemia   . PE (pulmonary thromboembolism) ([facility]) x 3, last 14 years ago    history of recurrent RLE DVT with PE - last PE ~>13 yrs; Maintained on Plavix  . Hyperlipidemia   . Diabetes mellitus type 2, insulin dependent ([place])   . Hypertension associated with diabetes ([place])   . Glaucoma   . Cataract   . Diabetic neuropathy ([place])   . Palpitations 35years ago    Cardionet monitor - revealed mostly normal sinus rhythm, sinus bradycardia with first-degree A-V block heart rates mostly in the 50s and 60s with some 70s. No arrhythmias, PVCs or PACs noted.  Marland Kitchen TIA (transient ischemic attack) March 2014  . Diastolic dysfunction, left ventricle     mild LVH, EF 55% by echo; no ischemia by stress 01/2010 echo [facility])  . Stroke [facility]) 04-19-2013    tia and stroke. Weakness Rt hand  . Sleep apnea     cpap disontinued; test done 07/14/2008 ordered per  [place]  . Sleep apnea   . Restless legs syndrome   . Spinal stenosis     L 3, L 4 and L 5, and C 1, C 2 and C3  . Complication  of anesthesia     hard to wake up after anesthesia, trouble turning head  . Spinal headache   . Memory loss 06/03/2014  . Osteoporosis     Past Surgical History  Procedure Laterality Date  . Abdominal hysterectomy    . Cholecystectomy    . Back surgery      steroid inj  . Tonsillectomy    . Appendectomy    . Esophagogastroduodenoscopy  08/09/2011    Procedure: ESOPHAGOGASTRODUODENOSCOPY (EGD);  Surgeon: Tara Columbia, MD;  Location: [facility];  Service: Endoscopy;  Laterality: N/A;  . Hot hemostasis  08/09/2011    Procedure: HOT HEMOSTASIS (ARGON PLASMA COAGULATION/BICAP);  Surgeon: Tara Columbia, MD;  Location: [facility];  Service: Endoscopy;  Laterality: N/A;  . Trigger finger release  11/22/2011    Procedure: RELEASE TRIGGER FINGER/A-1 PULLEY;  Surgeon: Meredith Pel, MD;  Location: [facility];  Service: Orthopedics;  Laterality: Left;  Left trigger thumb release  . Cardiac catheterization  02/14/2012      no evidence of obstructive coronary disease ,low EDP with normal EF  . Doppler echocardiography  04/19/2012    EF 0000000; normal diastolic pressures, no regional wall motion motion abnormalities  . Nm myocar perf wall motion  01/26/2012    EF 79%,LV normal,ST SEGMENT CHANGE SUGGESTIVE OF ISCHEMIA.  Tara Jackson doppler  05/29/2012    RIGHT EXTREM. NORMAL VENOUS DUPLEX  . Joint replacement      right knee x 1, left knee x 2  . Left knee spacer bar placement  done with 2nd left knee replacement  . Eye surgery Bilateral 4-5-yrs ago  . Right trigger finger release  yrs ago  . Esophagogastroduodenoscopy (egd) with propofol N/A 11/12/2013    Procedure: ESOPHAGOGASTRODUODENOSCOPY (EGD) WITH PROPOFOL;  Surgeon: Tara Columbia, MD;  Location: [facility];  Service: Endoscopy;  Laterality: N/A;  . Hot hemostasis N/A 11/12/2013    Procedure: HOT HEMOSTASIS (ARGON PLASMA COAGULATION/BICAP);  Surgeon: Tara Columbia, MD;  Location: [facility];  Service: Endoscopy;  Laterality: N/A;  . Left heart catheterization with coronary angiogram N/A 02/14/2012    Procedure: LEFT HEART CATHETERIZATION WITH CORONARY ANGIOGRAM;  Surgeon: Tara Man, MD;  Location: [facility] CATH LAB;  Service: Cardiovascular;  Laterality: N/A;     Current Outpatient Prescriptions  Medication Sig Dispense Refill  . albuterol (PROVENTIL HFA;VENTOLIN HFA) 108 (90 BASE) MCG/ACT inhaler Inhale 2 puffs into the lungs every 6 (six) hours as needed for wheezing.    . busPIRone (BUSPAR) 10 MG tablet Take 1 tablet (10 mg total) by mouth every morning. 60 tablet 1  . Cholecalciferol (VITAMIN D-3 PO) Take 1 tablet by mouth every morning.     . clonazePAM (KLONOPIN) 0.5 MG tablet 0.5-1 mg 2 (two) times daily. 1 mg (2 tablets) in the morning and 0.5 mg (1 tablet) at night    . clopidogrel (PLAVIX) 75 MG tablet Take 75 mg by mouth every morning.    . diclofenac sodium (VOLTAREN) 1 % GEL Apply 2 g topically 3 (three) times daily as needed (to affected  area). 1 Tube 0  . dicyclomine (BENTYL) 10 MG capsule Take 10 mg by mouth 3 (three) times daily as needed for spasms.     Marland Kitchen esomeprazole (NEXIUM) 40 MG capsule Take 40 mg by mouth daily before breakfast.    . furosemide (LASIX) 40 MG tablet Take 40 mg by mouth.    Marland Kitchen glipiZIDE (GLUCOTROL) 10 MG tablet Take 20 mg by mouth 2 (  two) times daily before a meal.     . insulin NPH (HUMULIN N,NOVOLIN N) 100 UNIT/ML injection Inject 30 Units into the skin daily before breakfast. Novolin-N    . insulin regular (NOVOLIN R,HUMULIN R) 100 units/mL injection Inject 0.05 mLs (5 Units total) into the skin 3 (three) times daily before meals.    Marland Kitchen levothyroxine (SYNTHROID, LEVOTHROID) 100 MCG tablet Take 100 mcg by mouth every morning.     . lidocaine (LIDODERM) 5 % Place 3 patches onto the skin daily. Remove & Discard patch within 12 hours or as directed by MD    . Linaclotide (LINZESS) 290 MCG CAPS capsule Take 290 mcg by mouth every morning.     Marland Kitchen LORazepam (ATIVAN) 0.5 MG tablet TK 1 T PO AS NEEDED FOR ANXIETY ATTACK  0  . meclizine (ANTIVERT) 12.5 MG tablet Take 12.5 mg by mouth 3 (three) times daily as needed for dizziness.     . nortriptyline (PAMELOR) 50 MG capsule Take 50 mg by mouth at bedtime.    . polyethylene glycol (MIRALAX / GLYCOLAX) packet Take 17 g by mouth daily as needed for mild constipation.    . potassium chloride (MICRO-K) 10 MEQ CR capsule Take 10 mEq by mouth daily as needed (leg cramps).     . propranolol (INDERAL) 20 MG tablet Take 1 tablet (20 mg total) by mouth every morning. Starting 12/02/14 30 tablet 0  . rosuvastatin (CRESTOR) 20 MG tablet Take 20 mg by mouth every morning.     . Sennosides (EX-LAX PO) Take 1 tablet by mouth daily as needed (constipation).     . sucralfate (CARAFATE) 1 G tablet Take 1-2 g by mouth daily.     Marland Kitchen terconazole (TERAZOL 7) 0.4 % vaginal cream Place 1 applicator vaginally at bedtime.     Marland Kitchen tiZANidine (ZANAFLEX) 4 MG tablet TAKE 1 TABLET BY MOUTH EVERY  NIGHT AT BEDTIME FOR 1 WEEK, THEN INCREASE TO 1 TABLET BY MOUTH TWICE DAILY THERAFTER 180 tablet 0  . valsartan (DIOVAN) 160 MG tablet Take 160 mg by mouth daily.     No current facility-administered medications for this visit.    Allergies:   Iodine; Metoclopramide hcl; Sulfa antibiotics; Iohexol; Lactose intolerance (gi); and Lansoprazole    Social History:  The patient  reports that she has never smoked. She has never used smokeless tobacco. She reports that she does not drink alcohol or use illicit drugs.   Family History:  The patient's family history includes Cancer in her mother; Cancer - Prostate in her father.    ROS:  General:no colds or fevers, no weight changes Skin:no rashes or ulcers HEENT:no blurred vision, no congestion CV:see HPI PUL:see HPI GI:no diarrhea constipation or melena, no indigestion GU:no hematuria, no dysuria MS:no joint pain, no claudication Neuro:no syncope, no lightheadedness Endo:+ diabetes stable, + thyroid disease  Wt Readings from Last 3 Encounters:  01/09/15 287 lb 12.8 oz (130.545 kg)  12/11/14 286 lb (129.729 kg)  11/30/14 286 lb 3.2 oz (129.819 kg)     PHYSICAL EXAM: VS:  BP 96/58 mmHg  Pulse 49  Ht 5\' 7"  (1.702 m)  Wt 287 lb 12.8 oz (130.545 kg)  BMI 45.07 kg/m2  SpO2 97% , BMI Body mass index is 45.07 kg/(m^2). General:Pleasant affect, NAD Skin:Warm and dry, brisk capillary refill HEENT:normocephalic, sclera clear, mucus membranes moist Neck:supple, no JVD, no bruits  Heart:S1S2 RRR without murmur, gallup, rub or click Lungs:clear without rales, rhonchi, or wheezes [id number], non tender, +  BS, do not palpate liver spleen or masses Ext:1+ lower ext edema,  2+ radial pulses Neuro:alert and oriented X 3, MAE, follows commands, + facial symmetry    EKG:  EKG is ordered today. The ekg ordered today demonstrates SB at 67 with 1st degree AV block- no longer than in the hospital, non specific ST changes but no changes in T  wave.   Recent Labs: 06/03/2014: TSH 1.580 11/28/2014: ALT 10* 11/29/2014: B Natriuretic Peptide 74.1; BUN 20; Creatinine, Ser 1.59*; Hemoglobin 10.7*; Platelets 227; Potassium 3.9; Sodium 133*    Lipid Panel    Component Value Date/Time   CHOL 125 04/20/2012 0514   TRIG 168* 04/20/2012 0514   HDL 49 04/20/2012 0514   CHOLHDL 2.6 04/20/2012 0514   VLDL 34 04/20/2012 0514   LDLCALC 42 04/20/2012 0514       Other studies Reviewed: Additional studies/ records that were reviewed today include: hospital notes, OV notes, echo..   ASSESSMENT AND PLAN:  1.  Bradycardia to at least 30 with near syncope. Pt is afraid she will die, she takes propranolol for tremor, though she does have heart disease.  HR today 45 and she is hypotensive.  Will add 30 day event monitor and did discuss possible pacemaker.  At times she does complain of tachycardia.  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  Discussed stopping propranolol and pt would prefer to do this.   2. Hypotension. Hold diovan tomorrow, she tells Jackson she has 320 mg tabs will decrease to 160 mg tabs and she will then resume at 80 mg daily for 2 days then to 160 mg.  She will keep appt with Kerin Ransom next week to check BP and re-evaluate.   3. Chronic combined systolic and diastolic HF. Some volume overload but with hypotension do not want to increase Lasix at this point.   4.  Recent neg nuc for ischemia.   Current medicines are reviewed with the patient today.  The patient Has no concerns regarding medicines.  The following changes have been made:  See above Labs/ tests ordered today include:see above  Disposition:   FU:  see above  Signed, Isaiah Serge, NP  01/09/2015 11:58 AM    [facility] [address], [place], [place], [place] Phone: [phone]; Fax: [phone]

## 2015-01-09 NOTE — Telephone Encounter (Signed)
Lets have her cut Propranolol dose in 1/2 for now.  Leonie Man, MD

## 2015-01-09 NOTE — Patient Instructions (Addendum)
Medication Instructions:  Your physician has recommended you make the following change in your medication:  1. Stop Inderal 2  Decrease Diovan ( 160 mg ) daily but for the next two days take one half tablet ( 80 mg ) of Diovan, If Bp is less than 100 call our office. This was sent in today to requested pharmacy.   Labwork: -None  Testing/Procedures: Your physician has recommended that you wear an event monitor. Event monitors are medical devices that record the heart's electrical activity. Doctors most often [place] these monitors to diagnose arrhythmias. Arrhythmias are problems with the speed or rhythm of the heartbeat. The monitor is a small, portable device. You can wear one while you do your normal daily activities. This is usually used to diagnose what is causing palpitations/syncope (passing out).     Follow-Up: Your physician recommends that you keep your scheduled  follow-up appointment with Kerin Ransom, PA-C   Any Other Special Instructions Will Be Listed Below (If Applicable). A chest x-ray takes a picture of the organs and structures inside the chest, including the heart, lungs, and blood vessels. This test can show several things, including, whether the heart is enlarges; whether fluid is building up in the lungs; and whether pacemaker / defibrillator leads are still in place. [facility]     If you need a refill on your cardiac medications before your next appointment, please call your pharmacy.

## 2015-01-09 NOTE — Telephone Encounter (Signed)
Patient saw Cecilie Kicks NP today- office visit Inderal was discontinued

## 2015-01-10 ENCOUNTER — Encounter: Payer: Self-pay | Admitting: Cardiology

## 2015-01-13 ENCOUNTER — Other Ambulatory Visit: Payer: Self-pay | Admitting: Cardiology

## 2015-01-13 DIAGNOSIS — R001 Bradycardia, unspecified: Secondary | ICD-10-CM

## 2015-01-13 DIAGNOSIS — I5041 Acute combined systolic (congestive) and diastolic (congestive) heart failure: Secondary | ICD-10-CM

## 2015-01-13 DIAGNOSIS — R55 Syncope and collapse: Secondary | ICD-10-CM

## 2015-01-14 ENCOUNTER — Encounter: Payer: Self-pay | Admitting: Cardiology

## 2015-01-14 ENCOUNTER — Ambulatory Visit (INDEPENDENT_AMBULATORY_CARE_PROVIDER_SITE_OTHER): Payer: Medicare HMO

## 2015-01-14 ENCOUNTER — Ambulatory Visit (INDEPENDENT_AMBULATORY_CARE_PROVIDER_SITE_OTHER): Payer: Medicare HMO | Admitting: Cardiology

## 2015-01-14 VITALS — BP 144/72 | HR 64 | Ht 67.5 in | Wt 282.8 lb

## 2015-01-14 DIAGNOSIS — I10 Essential (primary) hypertension: Secondary | ICD-10-CM

## 2015-01-14 DIAGNOSIS — I519 Heart disease, unspecified: Secondary | ICD-10-CM | POA: Diagnosis not present

## 2015-01-14 DIAGNOSIS — E1121 Type 2 diabetes mellitus with diabetic nephropathy: Secondary | ICD-10-CM

## 2015-01-14 DIAGNOSIS — E1122 Type 2 diabetes mellitus with diabetic chronic kidney disease: Secondary | ICD-10-CM

## 2015-01-14 DIAGNOSIS — R001 Bradycardia, unspecified: Secondary | ICD-10-CM

## 2015-01-14 DIAGNOSIS — R55 Syncope and collapse: Secondary | ICD-10-CM

## 2015-01-14 DIAGNOSIS — Z0389 Encounter for observation for other suspected diseases and conditions ruled out: Secondary | ICD-10-CM

## 2015-01-14 DIAGNOSIS — N183 Chronic kidney disease, stage 3 unspecified: Secondary | ICD-10-CM

## 2015-01-14 DIAGNOSIS — I5041 Acute combined systolic (congestive) and diastolic (congestive) heart failure: Secondary | ICD-10-CM

## 2015-01-14 DIAGNOSIS — Z136 Encounter for screening for cardiovascular disorders: Secondary | ICD-10-CM

## 2015-01-14 DIAGNOSIS — IMO0001 Reserved for inherently not codable concepts without codable children: Secondary | ICD-10-CM

## 2015-01-14 NOTE — Assessment & Plan Note (Addendum)
BMI 43 

## 2015-01-14 NOTE — Patient Instructions (Signed)
Medication Instructions:  Your physician recommends that you continue on your current medications as directed. Please refer to the Current Medication list given to you today.  Labwork: none  Testing/Procedures: Get your monitor as scheduled at [facility], will call you with the results   Follow-Up: Your physician recommends that you schedule a follow-up appointment in: 3 month ov with Dr Ellyn Hack   If you need a refill on your cardiac medications before your next appointment, please call your pharmacy.

## 2015-01-14 NOTE — Assessment & Plan Note (Signed)
Controlled.  

## 2015-01-14 NOTE — Assessment & Plan Note (Signed)
IDDM with CRI 

## 2015-01-14 NOTE — Progress Notes (Signed)
01/14/2015 [facility]   10-Dec-1940  NX:[id number]  Primary Physician [name], MD Primary Cardiologist: Dr Ellyn Hack  HPI:  74 y/o obese, AA female we saw in Jan 2014 for chest pain and dyspnea. She had an abnormal Myoview then. Cath done jan 2014 showed normal LVF and normal coronaries. She has had multiple medical issues including IDDM, CRI, HTN, remote h/o PE, "TIA", and sleep apnea. She was recnetly admitted 11/29/14 after she presented to [facility] this by her B/P machine at [facility] with chest pain (though pt now denies she had chest pain despite the records and her husband who accompanied her today), and a low HR "30". EKG on admission showed HR 45. She had been on Propanolol and this was stopped. She had vascular congestion on CXR- her BNP was only 131. She had an echo and Myoview, both of which were unremarkable. She is in the office today for follow up. She tells me her HR was "30" for 30 minutes. She could tell this by her B/P machine at home. She is very anxious about this.    Current Outpatient Prescriptions  Medication Sig Dispense Refill  . albuterol (PROVENTIL HFA;VENTOLIN HFA) 108 (90 BASE) MCG/ACT inhaler Inhale 2 puffs into the lungs every 6 (six) hours as needed for wheezing.    . busPIRone (BUSPAR) 10 MG tablet Take 1 tablet (10 mg total) by mouth every morning. 60 tablet 1  . Cholecalciferol (VITAMIN D-3 PO) Take 1 tablet by mouth every morning.     . clonazePAM (KLONOPIN) 0.5 MG tablet 0.5-1 mg 2 (two) times daily. 1 mg (2 tablets) in the morning and 0.5 mg (1 tablet) at night    . clopidogrel (PLAVIX) 75 MG tablet Take 75 mg by mouth every morning.    . diclofenac sodium (VOLTAREN) 1 % GEL Apply 2 g topically 3 (three) times daily as needed (to affected area). 1 Tube 0  . dicyclomine (BENTYL) 10 MG capsule Take 10 mg by mouth 3 (three) times daily as needed for spasms.     Marland Kitchen esomeprazole (NEXIUM) 40 MG capsule Take 40 mg by mouth daily before breakfast.      . furosemide (LASIX) 40 MG tablet Take 40 mg by mouth.    Marland Kitchen glipiZIDE (GLUCOTROL) 10 MG tablet Take 20 mg by mouth 2 (two) times daily before a meal.     . insulin NPH (HUMULIN N,NOVOLIN N) 100 UNIT/ML injection Inject 30 Units into the skin daily before breakfast. Novolin-N    . insulin regular (NOVOLIN R,HUMULIN R) 100 units/mL injection Inject 0.05 mLs (5 Units total) into the skin 3 (three) times daily before meals.    Marland Kitchen levothyroxine (SYNTHROID, LEVOTHROID) 100 MCG tablet Take 100 mcg by mouth every morning.     . lidocaine (LIDODERM) 5 % Place 3 patches onto the skin daily. Remove & Discard patch within 12 hours or as directed by MD    . Linaclotide (LINZESS) 290 MCG CAPS capsule Take 290 mcg by mouth every morning.     Marland Kitchen LORazepam (ATIVAN) 0.5 MG tablet TK 1 T PO AS NEEDED FOR ANXIETY ATTACK  0  . meclizine (ANTIVERT) 12.5 MG tablet Take 12.5 mg by mouth 3 (three) times daily as needed for dizziness.     . nortriptyline (PAMELOR) 50 MG capsule Take 50 mg by mouth at bedtime.    . polyethylene glycol (MIRALAX / GLYCOLAX) packet Take 17 g by mouth daily as needed for mild constipation.    Marland Kitchen  potassium chloride (MICRO-K) 10 MEQ CR capsule Take 10 mEq by mouth daily as needed (leg cramps).     . rosuvastatin (CRESTOR) 20 MG tablet Take 20 mg by mouth every morning.     . Sennosides (EX-LAX PO) Take 1 tablet by mouth daily as needed (constipation).     . sucralfate (CARAFATE) 1 G tablet Take 1-2 g by mouth daily.     Marland Kitchen terconazole (TERAZOL 7) 0.4 % vaginal cream Place 1 applicator vaginally at bedtime.     . valsartan (DIOVAN) 160 MG tablet Take 1 tablet (160 mg total) by mouth daily. 30 tablet 11   No current facility-administered medications for this visit.    Allergies  Allergen Reactions  . Iodine Other (See Comments)    ONLY IV--Makes unconscious  . Metoclopramide Hcl Other (See Comments)    suicidal  . Sulfa Antibiotics Hives  . Iohexol      Code: SOB, Desc: cardiac arrest w/ iv  contrast, has never used 13 hr prep//alice calhoun, Onset Date: [id number]   . Lactose Intolerance (Gi) Diarrhea  . Lansoprazole Nausea And Vomiting    Social History   Social History  . Marital Status: Married    Spouse Name: [name]  . Number of Children: 2  . Years of Education: college   Occupational History  . retired    Social History Main Topics  . Smoking status: Never Smoker   . Smokeless tobacco: Never Used  . Alcohol Use: No  . Drug Use: No  . Sexual Activity: Yes   Other Topics Concern  . Not on file   Social History Narrative   Married [name].) married 84 years.   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         Review of Systems: General: negative for chills, fever, night sweats or weight changes.  Cardiovascular: negative for chest pain, dyspnea on exertion, edema, orthopnea, palpitations, paroxysmal nocturnal dyspnea or shortness of breath Dermatological: negative for rash Respiratory: negative for cough or wheezing Urologic: negative for hematuria Abdominal: negative for nausea, vomiting, diarrhea, bright red blood per rectum, melena, or hematemesis Neurologic: negative for visual changes, syncope, or dizziness All other systems reviewed and are otherwise negative except as noted above.    Blood pressure 144/72, pulse 64, height 5' 7.5" (1.715 m), weight 282 lb 12.8 oz (128.277 kg).  General appearance: alert, cooperative, morbidly obese and in wheel chair Lungs: clear to auscultation bilaterally Heart: regular rate and rhythm Extremities: trace edema Skin: cool and dry Neurologic: Grossly normal  EKG NSR- HR 64, 1st degree AVB  ASSESSMENT AND PLAN:   Normal cardiac stress test Negative Myoview [id number]  Diastolic dysfunction, left ventricle Normal LVF, grade 1 diastolic dysfunction [id number]  Bradycardia HR 45 when she presented to [facility] stopped  Morbid obesity ([place]) BMI  43  Type 2 diabetes mellitus with renal manifestations ([place]) IDDM with CRI  CKD stage 3 due to type 2 diabetes mellitus ([place]) SCR 1.5  Essential hypertension Controlled  Normal coronary arteries Jan 2014 (after abnormal Myoview)    PLAN  She is scheduled for a monitor this am at [facility] and I encouraged her to get this done. She seemed upset that we couldn't just put a pacemaker in but I explained that a significant arrhythmia needed to be documented.   Kerin Ransom K PA-C 01/14/2015 9:29 AM

## 2015-01-14 NOTE — Assessment & Plan Note (Signed)
Negative Myoview 11/30/14

## 2015-01-14 NOTE — Assessment & Plan Note (Signed)
Normal LVF, grade 1 diastolic dysfunction [id number]

## 2015-01-14 NOTE — Assessment & Plan Note (Signed)
Jan 2014 (after abnormal Myoview)

## 2015-01-14 NOTE — Assessment & Plan Note (Signed)
HR 45 when she presented to [place] stopped

## 2015-01-14 NOTE — Assessment & Plan Note (Signed)
SCR 1.5

## 2015-01-15 DIAGNOSIS — I517 Cardiomegaly: Secondary | ICD-10-CM | POA: Diagnosis not present

## 2015-01-15 DIAGNOSIS — R251 Tremor, unspecified: Secondary | ICD-10-CM | POA: Diagnosis not present

## 2015-01-15 DIAGNOSIS — Z6841 Body Mass Index (BMI) 40.0 and over, adult: Secondary | ICD-10-CM | POA: Diagnosis not present

## 2015-01-15 DIAGNOSIS — N183 Chronic kidney disease, stage 3 (moderate): Secondary | ICD-10-CM | POA: Diagnosis not present

## 2015-01-15 DIAGNOSIS — I129 Hypertensive chronic kidney disease with stage 1 through stage 4 chronic kidney disease, or unspecified chronic kidney disease: Secondary | ICD-10-CM | POA: Diagnosis not present

## 2015-01-15 DIAGNOSIS — I10 Essential (primary) hypertension: Secondary | ICD-10-CM | POA: Diagnosis not present

## 2015-01-15 DIAGNOSIS — R001 Bradycardia, unspecified: Secondary | ICD-10-CM | POA: Diagnosis not present

## 2015-01-15 DIAGNOSIS — R69 Illness, unspecified: Secondary | ICD-10-CM | POA: Diagnosis not present

## 2015-01-22 DIAGNOSIS — I10 Essential (primary) hypertension: Secondary | ICD-10-CM | POA: Diagnosis not present

## 2015-01-22 DIAGNOSIS — E1149 Type 2 diabetes mellitus with other diabetic neurological complication: Secondary | ICD-10-CM | POA: Diagnosis not present

## 2015-01-22 DIAGNOSIS — I509 Heart failure, unspecified: Secondary | ICD-10-CM | POA: Diagnosis not present

## 2015-02-22 DIAGNOSIS — I509 Heart failure, unspecified: Secondary | ICD-10-CM | POA: Diagnosis not present

## 2015-02-22 DIAGNOSIS — I10 Essential (primary) hypertension: Secondary | ICD-10-CM | POA: Diagnosis not present

## 2015-02-25 ENCOUNTER — Telehealth: Payer: Self-pay | Admitting: *Deleted

## 2015-02-25 NOTE — Telephone Encounter (Signed)
-----   Message from Leonie Man, MD sent at 02/24/2015  3:29 PM EST ----- Essentially normal Monitor .  [place]

## 2015-02-25 NOTE — Telephone Encounter (Signed)
Spoke to patient. Result given . Verbalized understanding  

## 2015-03-06 DIAGNOSIS — E038 Other specified hypothyroidism: Secondary | ICD-10-CM | POA: Diagnosis not present

## 2015-03-06 DIAGNOSIS — E782 Mixed hyperlipidemia: Secondary | ICD-10-CM | POA: Diagnosis not present

## 2015-03-06 DIAGNOSIS — E039 Hypothyroidism, unspecified: Secondary | ICD-10-CM | POA: Diagnosis not present

## 2015-03-06 DIAGNOSIS — Z0000 Encounter for general adult medical examination without abnormal findings: Secondary | ICD-10-CM | POA: Diagnosis not present

## 2015-03-06 DIAGNOSIS — E1149 Type 2 diabetes mellitus with other diabetic neurological complication: Secondary | ICD-10-CM | POA: Diagnosis not present

## 2015-03-06 DIAGNOSIS — I10 Essential (primary) hypertension: Secondary | ICD-10-CM | POA: Diagnosis not present

## 2015-03-06 DIAGNOSIS — E559 Vitamin D deficiency, unspecified: Secondary | ICD-10-CM | POA: Diagnosis not present

## 2015-03-13 DIAGNOSIS — Z794 Long term (current) use of insulin: Secondary | ICD-10-CM | POA: Diagnosis not present

## 2015-03-13 DIAGNOSIS — I6990 Unspecified sequelae of unspecified cerebrovascular disease: Secondary | ICD-10-CM | POA: Diagnosis not present

## 2015-03-13 DIAGNOSIS — R69 Illness, unspecified: Secondary | ICD-10-CM | POA: Diagnosis not present

## 2015-03-13 DIAGNOSIS — Z1389 Encounter for screening for other disorder: Secondary | ICD-10-CM | POA: Diagnosis not present

## 2015-03-13 DIAGNOSIS — I129 Hypertensive chronic kidney disease with stage 1 through stage 4 chronic kidney disease, or unspecified chronic kidney disease: Secondary | ICD-10-CM | POA: Diagnosis not present

## 2015-03-13 DIAGNOSIS — E1149 Type 2 diabetes mellitus with other diabetic neurological complication: Secondary | ICD-10-CM | POA: Diagnosis not present

## 2015-03-13 DIAGNOSIS — R413 Other amnesia: Secondary | ICD-10-CM | POA: Diagnosis not present

## 2015-03-13 DIAGNOSIS — R2689 Other abnormalities of gait and mobility: Secondary | ICD-10-CM | POA: Diagnosis not present

## 2015-03-13 DIAGNOSIS — Z0000 Encounter for general adult medical examination without abnormal findings: Secondary | ICD-10-CM | POA: Diagnosis not present

## 2015-03-16 ENCOUNTER — Other Ambulatory Visit: Payer: Self-pay | Admitting: Internal Medicine

## 2015-03-16 DIAGNOSIS — Z1231 Encounter for screening mammogram for malignant neoplasm of breast: Secondary | ICD-10-CM

## 2015-03-24 ENCOUNTER — Encounter (HOSPITAL_COMMUNITY)
Admission: EM | Disposition: A | Discharge status: Home / Self Care | Payer: Self-pay | Source: Home / Self Care | Attending: Cardiovascular Disease

## 2015-03-24 ENCOUNTER — Ambulatory Visit (HOSPITAL_COMMUNITY)
Admission: EM | Admit: 2015-03-24 | Discharge: 2015-03-25 | Disposition: A | Discharge status: Home / Self Care | Payer: Medicare HMO | Attending: Cardiovascular Disease | Admitting: Cardiovascular Disease

## 2015-03-24 ENCOUNTER — Encounter (HOSPITAL_COMMUNITY): Payer: Self-pay | Admitting: Emergency Medicine

## 2015-03-24 DIAGNOSIS — Z950 Presence of cardiac pacemaker: Secondary | ICD-10-CM

## 2015-03-24 DIAGNOSIS — Z7902 Long term (current) use of antithrombotics/antiplatelets: Secondary | ICD-10-CM | POA: Insufficient documentation

## 2015-03-24 DIAGNOSIS — Z794 Long term (current) use of insulin: Secondary | ICD-10-CM | POA: Insufficient documentation

## 2015-03-24 DIAGNOSIS — R001 Bradycardia, unspecified: Secondary | ICD-10-CM | POA: Diagnosis not present

## 2015-03-24 DIAGNOSIS — E785 Hyperlipidemia, unspecified: Secondary | ICD-10-CM | POA: Diagnosis not present

## 2015-03-24 DIAGNOSIS — Z96653 Presence of artificial knee joint, bilateral: Secondary | ICD-10-CM | POA: Insufficient documentation

## 2015-03-24 DIAGNOSIS — I440 Atrioventricular block, first degree: Secondary | ICD-10-CM | POA: Diagnosis not present

## 2015-03-24 DIAGNOSIS — J45909 Unspecified asthma, uncomplicated: Secondary | ICD-10-CM | POA: Diagnosis not present

## 2015-03-24 DIAGNOSIS — Z79899 Other long term (current) drug therapy: Secondary | ICD-10-CM | POA: Diagnosis not present

## 2015-03-24 DIAGNOSIS — Z86718 Personal history of other venous thrombosis and embolism: Secondary | ICD-10-CM | POA: Insufficient documentation

## 2015-03-24 DIAGNOSIS — I495 Sick sinus syndrome: Principal | ICD-10-CM | POA: Insufficient documentation

## 2015-03-24 DIAGNOSIS — I10 Essential (primary) hypertension: Secondary | ICD-10-CM | POA: Diagnosis not present

## 2015-03-24 DIAGNOSIS — N189 Chronic kidney disease, unspecified: Secondary | ICD-10-CM | POA: Insufficient documentation

## 2015-03-24 DIAGNOSIS — E1140 Type 2 diabetes mellitus with diabetic neuropathy, unspecified: Secondary | ICD-10-CM | POA: Diagnosis not present

## 2015-03-24 DIAGNOSIS — I5032 Chronic diastolic (congestive) heart failure: Secondary | ICD-10-CM | POA: Diagnosis not present

## 2015-03-24 DIAGNOSIS — E669 Obesity, unspecified: Secondary | ICD-10-CM

## 2015-03-24 DIAGNOSIS — F329 Major depressive disorder, single episode, unspecified: Secondary | ICD-10-CM | POA: Diagnosis not present

## 2015-03-24 DIAGNOSIS — I130 Hypertensive heart and chronic kidney disease with heart failure and stage 1 through stage 4 chronic kidney disease, or unspecified chronic kidney disease: Secondary | ICD-10-CM | POA: Diagnosis not present

## 2015-03-24 DIAGNOSIS — Z8673 Personal history of transient ischemic attack (TIA), and cerebral infarction without residual deficits: Secondary | ICD-10-CM | POA: Insufficient documentation

## 2015-03-24 DIAGNOSIS — M810 Age-related osteoporosis without current pathological fracture: Secondary | ICD-10-CM | POA: Insufficient documentation

## 2015-03-24 DIAGNOSIS — E1169 Type 2 diabetes mellitus with other specified complication: Secondary | ICD-10-CM | POA: Insufficient documentation

## 2015-03-24 DIAGNOSIS — G4730 Sleep apnea, unspecified: Secondary | ICD-10-CM | POA: Diagnosis not present

## 2015-03-24 DIAGNOSIS — I519 Heart disease, unspecified: Secondary | ICD-10-CM | POA: Insufficient documentation

## 2015-03-24 DIAGNOSIS — Z86711 Personal history of pulmonary embolism: Secondary | ICD-10-CM | POA: Insufficient documentation

## 2015-03-24 DIAGNOSIS — F419 Anxiety disorder, unspecified: Secondary | ICD-10-CM | POA: Diagnosis not present

## 2015-03-24 DIAGNOSIS — E119 Type 2 diabetes mellitus without complications: Secondary | ICD-10-CM

## 2015-03-24 DIAGNOSIS — E039 Hypothyroidism, unspecified: Secondary | ICD-10-CM | POA: Diagnosis not present

## 2015-03-24 DIAGNOSIS — E1122 Type 2 diabetes mellitus with diabetic chronic kidney disease: Secondary | ICD-10-CM | POA: Insufficient documentation

## 2015-03-24 DIAGNOSIS — R072 Precordial pain: Secondary | ICD-10-CM | POA: Diagnosis not present

## 2015-03-24 DIAGNOSIS — R0602 Shortness of breath: Secondary | ICD-10-CM | POA: Diagnosis not present

## 2015-03-24 DIAGNOSIS — R079 Chest pain, unspecified: Secondary | ICD-10-CM | POA: Insufficient documentation

## 2015-03-24 DIAGNOSIS — M1990 Unspecified osteoarthritis, unspecified site: Secondary | ICD-10-CM | POA: Diagnosis not present

## 2015-03-24 HISTORY — DX: Calculus of kidney: N20.0

## 2015-03-24 HISTORY — PX: EP IMPLANTABLE DEVICE: SHX172B

## 2015-03-24 HISTORY — DX: Bradycardia, unspecified: R00.1

## 2015-03-24 HISTORY — DX: Presence of cardiac pacemaker: Z95.0

## 2015-03-24 HISTORY — PX: INSERT / REPLACE / REMOVE PACEMAKER: SUR710

## 2015-03-24 HISTORY — DX: Dependence on supplemental oxygen: Z99.81

## 2015-03-24 LAB — COMPREHENSIVE METABOLIC PANEL
ALT: 14 U/L (ref 14–54)
AST: 27 U/L (ref 15–41)
Albumin: 3.1 g/dL — ABNORMAL LOW (ref 3.5–5.0)
Alkaline Phosphatase: 56 U/L (ref 38–126)
Anion gap: 14 (ref 5–15)
BUN: 29 mg/dL — ABNORMAL HIGH (ref 6–20)
CO2: 19 mmol/L — ABNORMAL LOW (ref 22–32)
Calcium: 9.2 mg/dL (ref 8.9–10.3)
Chloride: 104 mmol/L (ref 101–111)
Creatinine, Ser: 1.97 mg/dL — ABNORMAL HIGH (ref 0.44–1.00)
GFR calc Af Amer: 27 mL/min — ABNORMAL LOW (ref >60)
GFR calc non Af Amer: 23 mL/min — ABNORMAL LOW (ref >60)
Glucose, Bld: 330 mg/dL — ABNORMAL HIGH (ref 65–99)
Potassium: 4.3 mmol/L (ref 3.5–5.1)
Sodium: 137 mmol/L (ref 135–145)
Total Bilirubin: 0.6 mg/dL (ref 0.3–1.2)
Total Protein: 6.4 g/dL — ABNORMAL LOW (ref 6.5–8.1)

## 2015-03-24 LAB — GLUCOSE, CAPILLARY
Glucose-Capillary: 174 mg/dL — ABNORMAL HIGH (ref 65–99)
Glucose-Capillary: 217 mg/dL — ABNORMAL HIGH (ref 65–99)
Glucose-Capillary: 218 mg/dL — ABNORMAL HIGH (ref 65–99)

## 2015-03-24 LAB — I-STAT CHEM 8, ED
BUN: 30 mg/dL — ABNORMAL HIGH (ref 6–20)
Calcium, Ion: 1.11 mmol/L — ABNORMAL LOW (ref 1.13–1.30)
Chloride: 102 mmol/L (ref 101–111)
Creatinine, Ser: 1.9 mg/dL — ABNORMAL HIGH (ref 0.44–1.00)
Glucose, Bld: 335 mg/dL — ABNORMAL HIGH (ref 65–99)
HCT: 34 % — ABNORMAL LOW (ref 36.0–46.0)
Hemoglobin: 11.6 g/dL — ABNORMAL LOW (ref 12.0–15.0)
Potassium: 4.2 mmol/L (ref 3.5–5.1)
Sodium: 137 mmol/L (ref 135–145)
TCO2: 22 mmol/L (ref 0–100)

## 2015-03-24 LAB — I-STAT TROPONIN, ED: Troponin i, poc: 0 ng/mL (ref 0.00–0.08)

## 2015-03-24 LAB — CBC WITH DIFFERENTIAL/PLATELET
Basophils Absolute: 0 10*3/uL (ref 0.0–0.1)
Basophils Relative: 1 %
Eosinophils Absolute: 0.2 10*3/uL (ref 0.0–0.7)
Eosinophils Relative: 3 %
HCT: 33.3 % — ABNORMAL LOW (ref 36.0–46.0)
Hemoglobin: 10.7 g/dL — ABNORMAL LOW (ref 12.0–15.0)
Lymphocytes Relative: 31 %
Lymphs Abs: 2.2 10*3/uL (ref 0.7–4.0)
MCH: 30.7 pg (ref 26.0–34.0)
MCHC: 32.1 g/dL (ref 30.0–36.0)
MCV: 95.7 fL (ref 78.0–100.0)
Monocytes Absolute: 0.3 10*3/uL (ref 0.1–1.0)
Monocytes Relative: 5 %
Neutro Abs: 4.5 10*3/uL (ref 1.7–7.7)
Neutrophils Relative %: 62 %
Platelets: 288 10*3/uL (ref 150–400)
RBC: 3.48 MIL/uL — ABNORMAL LOW (ref 3.87–5.11)
RDW: 12.8 % (ref 11.5–15.5)
WBC: 7.2 10*3/uL (ref 4.0–10.5)

## 2015-03-24 LAB — CBC
HCT: 34.1 % — ABNORMAL LOW (ref 36.0–46.0)
Hemoglobin: 11 g/dL — ABNORMAL LOW (ref 12.0–15.0)
MCH: 30.5 pg (ref 26.0–34.0)
MCHC: 32.3 g/dL (ref 30.0–36.0)
MCV: 94.5 fL (ref 78.0–100.0)
Platelets: 255 10*3/uL (ref 150–400)
RBC: 3.61 MIL/uL — ABNORMAL LOW (ref 3.87–5.11)
RDW: 12.6 % (ref 11.5–15.5)
WBC: 8.2 10*3/uL (ref 4.0–10.5)

## 2015-03-24 LAB — CBG MONITORING, ED: Glucose-Capillary: 295 mg/dL — ABNORMAL HIGH (ref 65–99)

## 2015-03-24 LAB — TROPONIN I: Troponin I: <0.03 ng/mL (ref <0.031)

## 2015-03-24 SURGERY — PACEMAKER IMPLANT
Anesthesia: LOCAL

## 2015-03-24 SURGERY — INVASIVE LAB ABORTED CASE

## 2015-03-24 MED ORDER — CEFAZOLIN SODIUM 10 G IJ SOLR: 3.0000 g | INTRAMUSCULAR | Status: DC

## 2015-03-24 MED ORDER — MIDAZOLAM HCL 5 MG/5ML IJ SOLN
INTRAMUSCULAR | Status: AC
  Filled 2015-03-24: qty 5

## 2015-03-24 MED ORDER — ALBUTEROL SULFATE (2.5 MG/3ML) 0.083% IN NEBU: 2.5000 mg | INHALATION_SOLUTION | Freq: Four times a day (QID) | RESPIRATORY_TRACT | Status: DC | PRN

## 2015-03-24 MED ORDER — BUSPIRONE HCL 10 MG PO TABS: 10.0000 mg | ORAL_TABLET | Freq: Every morning | ORAL | Status: DC

## 2015-03-24 MED ORDER — SODIUM CHLORIDE 0.9 % IV SOLN: INTRAVENOUS | Status: DC

## 2015-03-24 MED ORDER — LIDOCAINE HCL (PF) 1 % IJ SOLN
INTRAMUSCULAR | Status: AC
  Filled 2015-03-24: qty 30

## 2015-03-24 MED ORDER — CLONAZEPAM 0.5 MG PO TABS: 0.5000 mg | ORAL_TABLET | Freq: Every morning | ORAL | Status: DC

## 2015-03-24 MED ORDER — ACETAMINOPHEN 325 MG PO TABS
325.0000 mg | ORAL_TABLET | ORAL | Status: DC | PRN
  Administered 2015-03-24 – 2015-03-25 (×2): 650 mg via ORAL
  Filled 2015-03-24 (×2): qty 2

## 2015-03-24 MED ORDER — MIDAZOLAM HCL 5 MG/5ML IJ SOLN
INTRAMUSCULAR | Status: DC | PRN
Start: 2015-03-24 — End: 2015-03-24
  Administered 2015-03-24 (×2): 1 mg via INTRAVENOUS

## 2015-03-24 MED ORDER — FENTANYL CITRATE (PF) 100 MCG/2ML IJ SOLN
INTRAMUSCULAR | Status: DC | PRN
  Administered 2015-03-24 (×2): 12.5 ug via INTRAVENOUS

## 2015-03-24 MED ORDER — ATROPINE SULFATE 1 MG/ML IJ SOLN: 0.5000 mg | Freq: Once | INTRAMUSCULAR | Status: DC

## 2015-03-24 MED ORDER — HEPARIN SODIUM (PORCINE) 5000 UNIT/ML IJ SOLN: 5000.0000 [IU] | Freq: Three times a day (TID) | INTRAMUSCULAR | Status: DC

## 2015-03-24 MED ORDER — INSULIN ASPART 100 UNIT/ML ~~LOC~~ SOLN
0.0000 [IU] | Freq: Three times a day (TID) | SUBCUTANEOUS | Status: DC
  Administered 2015-03-25: 5 [IU] via SUBCUTANEOUS

## 2015-03-24 MED ORDER — VITAMIN D 1000 UNITS PO TABS: ORAL_TABLET | Freq: Every morning | ORAL | Status: DC

## 2015-03-24 MED ORDER — FENTANYL CITRATE (PF) 100 MCG/2ML IJ SOLN
INTRAMUSCULAR | Status: AC
  Filled 2015-03-24: qty 2

## 2015-03-24 MED ORDER — HEPARIN (PORCINE) IN NACL 2-0.9 UNIT/ML-% IJ SOLN
INTRAMUSCULAR | Status: DC | PRN
  Administered 2015-03-24: 500 mL

## 2015-03-24 MED ORDER — METHYLPREDNISOLONE SODIUM SUCC 125 MG IJ SOLR
INTRAMUSCULAR | Status: AC
  Filled 2015-03-24: qty 2

## 2015-03-24 MED ORDER — FUROSEMIDE 40 MG PO TABS: 40.0000 mg | ORAL_TABLET | Freq: Every day | ORAL | Status: DC

## 2015-03-24 MED ORDER — ROSUVASTATIN CALCIUM 20 MG PO TABS: 20.0000 mg | ORAL_TABLET | Freq: Every morning | ORAL | Status: DC

## 2015-03-24 MED ORDER — FENTANYL CITRATE (PF) 100 MCG/2ML IJ SOLN
25.0000 ug | Freq: Once | INTRAMUSCULAR | Status: AC
  Administered 2015-03-24: 25 ug via INTRAVENOUS
  Filled 2015-03-24: qty 2

## 2015-03-24 MED ORDER — ALBUTEROL SULFATE HFA 108 (90 BASE) MCG/ACT IN AERS: 2.0000 | INHALATION_SPRAY | Freq: Four times a day (QID) | RESPIRATORY_TRACT | Status: DC | PRN

## 2015-03-24 MED ORDER — INSULIN ASPART 100 UNIT/ML ~~LOC~~ SOLN
0.0000 [IU] | Freq: Every day | SUBCUTANEOUS | Status: DC
  Administered 2015-03-24 – 2015-03-25 (×2): 2 [IU] via SUBCUTANEOUS

## 2015-03-24 MED ORDER — GENTAMICIN SULFATE 40 MG/ML IJ SOLN: 80.0000 mg | INTRAMUSCULAR | Status: DC

## 2015-03-24 MED ORDER — CLONAZEPAM 0.5 MG PO TABS
1.0000 mg | ORAL_TABLET | Freq: Every day | ORAL | Status: DC
  Administered 2015-03-24: 1 mg via ORAL
  Filled 2015-03-24: qty 2

## 2015-03-24 MED ORDER — IOHEXOL 350 MG/ML SOLN
INTRAVENOUS | Status: DC | PRN
Start: 2015-03-24 — End: 2015-03-24
  Administered 2015-03-24: 10 mL via INTRAVENOUS

## 2015-03-24 MED ORDER — ONDANSETRON HCL 4 MG/2ML IJ SOLN
4.0000 mg | Freq: Four times a day (QID) | INTRAMUSCULAR | Status: DC | PRN
  Administered 2015-03-24 (×2): 4 mg via INTRAVENOUS
  Filled 2015-03-24: qty 2

## 2015-03-24 MED ORDER — HEPARIN (PORCINE) IN NACL 2-0.9 UNIT/ML-% IJ SOLN
INTRAMUSCULAR | Status: AC
  Filled 2015-03-24: qty 500

## 2015-03-24 MED ORDER — LIDOCAINE HCL (PF) 1 % IJ SOLN
INTRAMUSCULAR | Status: DC | PRN
  Administered 2015-03-24: 60 mL

## 2015-03-24 MED ORDER — ACETAMINOPHEN 325 MG PO TABS: 650.0000 mg | ORAL_TABLET | ORAL | Status: DC | PRN

## 2015-03-24 MED ORDER — CEFAZOLIN SODIUM 1-5 GM-% IV SOLN
INTRAVENOUS | Status: AC
  Filled 2015-03-24: qty 50

## 2015-03-24 MED ORDER — DIPHENHYDRAMINE HCL 50 MG/ML IJ SOLN
INTRAMUSCULAR | Status: AC
  Filled 2015-03-24: qty 1

## 2015-03-24 MED ORDER — DICLOFENAC SODIUM 1 % TD GEL
2.0000 g | Freq: Three times a day (TID) | TRANSDERMAL | Status: DC | PRN
  Filled 2015-03-24: qty 100

## 2015-03-24 MED ORDER — SODIUM CHLORIDE 0.9 % IV SOLN: 250.0000 mL | INTRAVENOUS | Status: DC | PRN

## 2015-03-24 MED ORDER — SODIUM CHLORIDE 0.9% FLUSH: 3.0000 mL | INTRAVENOUS | Status: DC | PRN

## 2015-03-24 MED ORDER — ONDANSETRON HCL 4 MG/2ML IJ SOLN
INTRAMUSCULAR | Status: AC
  Filled 2015-03-24: qty 2

## 2015-03-24 MED ORDER — ATROPINE SULFATE 1 MG/ML IJ SOLN: 1.0000 mg | Freq: Once | INTRAMUSCULAR | Status: DC

## 2015-03-24 MED ORDER — INSULIN NPH (HUMAN) (ISOPHANE) 100 UNIT/ML ~~LOC~~ SUSP
30.0000 [IU] | Freq: Every day | SUBCUTANEOUS | Status: DC
  Administered 2015-03-25: 09:00:00 30 [IU] via SUBCUTANEOUS
  Filled 2015-03-24: qty 10

## 2015-03-24 MED ORDER — SODIUM CHLORIDE 0.9% FLUSH
3.0000 mL | Freq: Two times a day (BID) | INTRAVENOUS | Status: DC
  Administered 2015-03-25: 3 mL via INTRAVENOUS

## 2015-03-24 MED ORDER — CEFAZOLIN SODIUM-DEXTROSE 2-3 GM-% IV SOLR
INTRAVENOUS | Status: AC
  Filled 2015-03-24: qty 50

## 2015-03-24 MED ORDER — INSULIN ASPART 100 UNIT/ML ~~LOC~~ SOLN
5.0000 [IU] | Freq: Three times a day (TID) | SUBCUTANEOUS | Status: DC
  Administered 2015-03-25 (×2): 5 [IU] via SUBCUTANEOUS

## 2015-03-24 MED ORDER — LEVOTHYROXINE SODIUM 100 MCG PO TABS
100.0000 ug | ORAL_TABLET | Freq: Every day | ORAL | Status: DC
  Administered 2015-03-25: 07:00:00 100 ug via ORAL
  Filled 2015-03-24: qty 1

## 2015-03-24 MED ORDER — CEFAZOLIN SODIUM-DEXTROSE 2-3 GM-% IV SOLR
2.0000 g | Freq: Four times a day (QID) | INTRAVENOUS | Status: AC
  Administered 2015-03-24 – 2015-03-25 (×3): 2 g via INTRAVENOUS
  Filled 2015-03-24 (×3): qty 50

## 2015-03-24 MED ORDER — CHLORHEXIDINE GLUCONATE 4 % EX LIQD: 60.0000 mL | Freq: Once | CUTANEOUS | Status: DC

## 2015-03-24 MED ORDER — DOPAMINE-DEXTROSE 3.2-5 MG/ML-% IV SOLN
INTRAVENOUS | Status: AC | PRN
  Administered 2015-03-24: 4.708 ug/kg/min via INTRAVENOUS

## 2015-03-24 MED ORDER — IRBESARTAN 150 MG PO TABS
150.0000 mg | ORAL_TABLET | Freq: Every day | ORAL | Status: DC
Start: 2015-03-24 — End: 2015-03-25
  Filled 2015-03-24 (×2): qty 1

## 2015-03-24 MED ORDER — DIPHENHYDRAMINE HCL 50 MG/ML IJ SOLN
25.0000 mg | Freq: Once | INTRAMUSCULAR | Status: AC
  Administered 2015-03-24: 25 mg via INTRAVENOUS

## 2015-03-24 MED ORDER — GENTAMICIN SULFATE 40 MG/ML IJ SOLN
INTRAMUSCULAR | Status: AC
  Filled 2015-03-24: qty 2

## 2015-03-24 MED ORDER — METHYLPREDNISOLONE SODIUM SUCC 125 MG IJ SOLR
62.5000 mg | Freq: Once | INTRAMUSCULAR | Status: AC
  Administered 2015-03-24: 62.5 mg via INTRAVENOUS

## 2015-03-24 MED ORDER — ATROPINE SULFATE 1 MG/ML IJ SOLN
INTRAMUSCULAR | Status: AC | PRN
  Administered 2015-03-24 (×2): 1 mg via INTRAVENOUS

## 2015-03-24 MED ORDER — SODIUM CHLORIDE 0.9 % IV SOLN
INTRAVENOUS | Status: DC | PRN
  Administered 2015-03-24: 250 mL via INTRAVENOUS

## 2015-03-24 MED ORDER — SODIUM CHLORIDE 0.9 % IR SOLN
Status: DC | PRN
  Administered 2015-03-24: 17:00:00

## 2015-03-24 MED ORDER — CEFAZOLIN SODIUM-DEXTROSE 2-3 GM-% IV SOLR
INTRAVENOUS | Status: DC | PRN
  Administered 2015-03-24: 2 g via INTRAVENOUS

## 2015-03-24 MED ORDER — MECLIZINE HCL 12.5 MG PO TABS
12.5000 mg | ORAL_TABLET | Freq: Three times a day (TID) | ORAL | Status: DC | PRN
  Filled 2015-03-24: qty 1

## 2015-03-24 MED ORDER — DICYCLOMINE HCL 10 MG PO CAPS
10.0000 mg | ORAL_CAPSULE | Freq: Three times a day (TID) | ORAL | Status: DC | PRN
  Filled 2015-03-24: qty 1

## 2015-03-24 MED ORDER — ONDANSETRON HCL 4 MG/2ML IJ SOLN
INTRAMUSCULAR | Status: DC | PRN
  Administered 2015-03-24: 4 mg via INTRAVENOUS

## 2015-03-24 MED ORDER — CLONAZEPAM 0.5 MG PO TABS
0.5000 mg | ORAL_TABLET | Freq: Two times a day (BID) | ORAL | Status: DC
  Filled 2015-03-24: qty 1

## 2015-03-24 MED ORDER — LIDOCAINE 5 % EX PTCH
3.0000 | MEDICATED_PATCH | CUTANEOUS | Status: DC
  Filled 2015-03-24 (×2): qty 3

## 2015-03-24 MED ORDER — PANTOPRAZOLE SODIUM 40 MG PO TBEC: 40.0000 mg | DELAYED_RELEASE_TABLET | Freq: Every day | ORAL | Status: DC

## 2015-03-24 MED ORDER — NORTRIPTYLINE HCL 25 MG PO CAPS
50.0000 mg | ORAL_CAPSULE | Freq: Every day | ORAL | Status: DC
  Administered 2015-03-24: 50 mg via ORAL
  Filled 2015-03-24 (×2): qty 2

## 2015-03-24 MED ORDER — SUCRALFATE 1 G PO TABS
1.0000 g | ORAL_TABLET | Freq: Every day | ORAL | Status: DC
  Filled 2015-03-24 (×2): qty 2

## 2015-03-24 MED ORDER — LINACLOTIDE 290 MCG PO CAPS
290.0000 ug | ORAL_CAPSULE | Freq: Every morning | ORAL | Status: DC
  Filled 2015-03-24: qty 1

## 2015-03-24 MED ORDER — POLYETHYLENE GLYCOL 3350 17 G PO PACK: 17.0000 g | PACK | Freq: Every day | ORAL | Status: DC | PRN

## 2015-03-24 MED ORDER — CEFAZOLIN SODIUM 1-5 GM-% IV SOLN
INTRAVENOUS | Status: DC | PRN
  Administered 2015-03-24: 1 g via INTRAVENOUS

## 2015-03-24 SURGICAL SUPPLY — 5 items
KIT HEART LEFT (KITS) IMPLANT
PACK CARDIAC CATHETERIZATION (CUSTOM PROCEDURE TRAY) IMPLANT
SHEATH PINNACLE 6F 10CM (SHEATH) IMPLANT
SLEEVE REPOSITIONING LENGTH 30 (MISCELLANEOUS) IMPLANT
TRANSDUCER W/STOPCOCK (MISCELLANEOUS) IMPLANT

## 2015-03-24 SURGICAL SUPPLY — 11 items
CABLE SURGICAL S-101-97-12 (CABLE) ×1 IMPLANT
HOVERMATT SINGLE USE (MISCELLANEOUS) ×1 IMPLANT
INGEVITY MRI 7740-45CM (Lead) ×2 IMPLANT
INGEVITY MRI 7741-52CM (Lead) ×2 IMPLANT
LEAD PACING INGEVITY MRI 45CM (Lead) IMPLANT
LEAD PACING INGEVITY MRI 52CM (Lead) IMPLANT
PAD DEFIB LIFELINK (PAD) ×1 IMPLANT
SHEATH CLASSIC 7F (SHEATH) ×2 IMPLANT
SYS PACING ACCOLADE EL DR L321 (Pacemaker) ×2 IMPLANT
SYSTEM PACING ACCLD EL DR L321 (Pacemaker) IMPLANT
TRAY PACEMAKER INSERTION (PACKS) ×1 IMPLANT

## 2015-03-24 NOTE — ED Notes (Signed)
Pt. Coming from home via [facility] c/o chest pain starting around 1100 this morning. Pt. Experienced pain after bowel movement. Upon arrival pt. Extremely brady at 30 bpm. Pt. Has similar hx since December. EKG showed sinus bradycardia. Pt. Given 324 ASA en route, but no nitro due to low blood pressure. Pt. Denies syncope and is AOX4 at this time.

## 2015-03-24 NOTE — Discharge Summary (Addendum)
ELECTROPHYSIOLOGY PROCEDURE DISCHARGE SUMMARY    Patient ID: Tara Jackson,  MRN: [id number], DOB/AGE: 77-22-1942 77 y.o.  Admit date: 03/24/2015 Discharge date: 03/25/2015  Primary Care Physician: Gwen Pounds, MD Primary Cardiologist: Dr. Herbie Baltimore Electrophysiologist: Dr. Ladona Ridgel  Primary Discharge Diagnosis:  Symptomatic bradycardia status post pacemaker implantation this admission  Secondary Discharge Diagnosis:  1. DM 2. HTN 3. CRI  Allergies  Allergen Reactions  . Iodine Other (See Comments)    ONLY IV--Makes unconscious  . Metoclopramide Hcl Other (See Comments)    suicidal  . Sulfa Antibiotics Hives  . Iohexol      Code: SOB, Desc: cardiac arrest w/ iv contrast, has never used 13 hr prep//alice calhoun, Onset Date: [id number]   . Lactose Intolerance (Gi) Diarrhea  . Lansoprazole Nausea And Vomiting     Procedures This Admission:  1.  Implantation of a [employer] dual chamber PPM on 03/24/15 by Dr Ladona Ridgel.  The patient received a Investment banker, corporate, serial [phone] with  [facility] Scientific 45 cm active fixation atrial pacing lead, serial [id number], and a [employer] 52 cm active fixation pacing lead serial #[id number] right ventriclar lead.  There were no immediate post procedure complications. 2.  CXR on 2/15 demonstrated no pneumothorax status post device implantation.   Brief HPI: Tara Jackson is a 75 y.o. female was admitted yesterday via the ER for symptomatic bradycardia and underwent PPM implantation the same day by Dr. Ladona Ridgel.  Hospital Course:  The patient was  Came to the hospital with c/o significant fatigue, weakness, dizziness, CP noted to have profound bradycardia with SB in the 30bpm range.  She had history of symptomatic bradycardia in the past and Oct 2016 her betablocker was stopped.    No reversible causes for her bradycardia were identified.  She was initially treated in the ED with atropine, withhout significant  response, she was started on a dopamine gtt for HR support and brought directly to the EP lab for PPM implantation by Dr. Ladona Ridgel, with details as outlined above.  She was monitored on telemetry overnight which demonstrated sinus rhythm with atrial pacing.  Left chest was without hematoma or ecchymosis.  The device was interrogated and found to be functioning normally.  CXR was obtained and demonstrated no pneumothorax status post device implantation.  Wound care, arm mobility, and restrictions were reviewed with the patient.  The patient was examined by Dr. Ladona Ridgel and considered stable for discharge to home.    Physical Exam: Filed Vitals:   03/24/15 2045 03/25/15 0129 03/25/15 0330 03/25/15 0846  BP:  134/53 130/42 127/97  Pulse:  61  60  Temp:  98.1 F (36.7 C)  98 F (36.7 C)  TempSrc:  Oral  Oral  Resp: 18 22 22 19   Weight:  284 lb 6.3 oz (129 kg)    SpO2:  93%  93%   GEN- The patient is well appearing, alert and oriented x 3 today.   HEENT: normocephalic, atraumatic; sclera clear, conjunctiva pink; hearing intact; oropharynx clear; neck supple, no JVP Lungs- Clear to ausculation bilaterally, normal work of breathing.  No wheezes, rales, rhonchi Heart- Regular rate and rhythm, no murmurs, rubs or gallops, PMI not laterally displaced GI- soft, non-tender, non-distended, bowel sounds present, no hepatosplenomegaly Extremities- no clubbing, cyanosis, or edema MS- no significant deformity or atrophy Skin- warm and dry, no rash or lesion, left chest without hematoma/ecchymosis Psych- euthymic mood, full affect Neuro- no gross deficits   Labs:  Lab Results  Component Value Date   WBC 8.2 03/24/2015   HGB 11.0* 03/24/2015   HCT 34.1* 03/24/2015   MCV 94.5 03/24/2015   PLT 255 03/24/2015     Recent Labs Lab 03/24/15 2334 03/25/15 0915  NA  --  138  K  --  4.4  CL  --  103  CO2  --  24  BUN  --  31*  CREATININE 2.40* 2.41*  CALCIUM  --  8.8*  PROT 6.9  --   BILITOT 0.6   --   ALKPHOS 58  --   ALT 15  --   AST 29  --   GLUCOSE  --  231*    Discharge Medications:    Medication List    TAKE these medications        albuterol 108 (90 Base) MCG/ACT inhaler  Commonly known as:  PROVENTIL HFA;VENTOLIN HFA  Inhale 2 puffs into the lungs every 6 (six) hours as needed for wheezing.     busPIRone 10 MG tablet  Commonly known as:  BUSPAR  Take 1 tablet (10 mg total) by mouth every morning.     clonazePAM 0.5 MG tablet  Commonly known as:  KLONOPIN  0.5-1 mg 2 (two) times daily. Reported on 03/24/2015     clopidogrel 75 MG tablet  Commonly known as:  PLAVIX  Take 75 mg by mouth every morning.     diclofenac sodium 1 % Gel  Commonly known as:  VOLTAREN  Apply 2 g topically 3 (three) times daily as needed (to affected area).     dicyclomine 10 MG capsule  Commonly known as:  BENTYL  Take 10 mg by mouth 3 (three) times daily as needed for spasms.     esomeprazole 40 MG capsule  Commonly known as:  NEXIUM  Take 40 mg by mouth daily before breakfast.     EX-LAX PO  Take 1 tablet by mouth daily as needed (constipation).     furosemide 40 MG tablet  Commonly known as:  LASIX  Take 40 mg by mouth daily.  Notes to Patient:  Resume on 03/26/15     glipiZIDE 10 MG tablet  Commonly known as:  GLUCOTROL  Take 20 mg by mouth 2 (two) times daily before a meal.     insulin NPH Human 100 UNIT/ML injection  Commonly known as:  HUMULIN N,NOVOLIN N  Inject 30 Units into the skin daily before breakfast. Novolin-N  Notes to Patient:  Continue to take this medication as you have been previously instructed by you prescribing physician.     insulin regular 100 units/mL injection  Commonly known as:  NOVOLIN R,HUMULIN R  Inject 0.05 mLs (5 Units total) into the skin 3 (three) times daily before meals.  Notes to Patient:  Continue to take this medication as you have been previously instructed by the prescribing physician     levothyroxine 100 MCG tablet    Commonly known as:  SYNTHROID, LEVOTHROID  Take 100 mcg by mouth every morning.     lidocaine 5 %  Commonly known as:  LIDODERM  Place 3 patches onto the skin daily. Remove & Discard patch within 12 hours or as directed by MD     LINZESS 290 MCG Caps capsule  Generic drug:  Linaclotide  Take 290 mcg by mouth every morning.     LORazepam 0.5 MG tablet  Commonly known as:  ATIVAN  TK 1 T PO AS NEEDED FOR ANXIETY ATTACK  meclizine 12.5 MG tablet  Commonly known as:  ANTIVERT  Take 12.5 mg by mouth 3 (three) times daily as needed for dizziness.     nortriptyline 50 MG capsule  Commonly known as:  PAMELOR  Take 50 mg by mouth at bedtime.     polyethylene glycol packet  Commonly known as:  MIRALAX / GLYCOLAX  Take 17 g by mouth daily as needed for mild constipation.     potassium chloride 10 MEQ CR capsule  Commonly known as:  MICRO-K  Take 10 mEq by mouth daily as needed (leg cramps).     rosuvastatin 20 MG tablet  Commonly known as:  CRESTOR  Take 20 mg by mouth every morning.     sucralfate 1 g tablet  Commonly known as:  CARAFATE  Take 1-2 g by mouth daily.     terconazole 0.4 % vaginal cream  Commonly known as:  TERAZOL 7  Place 1 applicator vaginally at bedtime.     valsartan 160 MG tablet  Commonly known as:  DIOVAN  Take 1 tablet (160 mg total) by mouth daily.     VITAMIN D-3 PO  Take 1 tablet by mouth every morning.        Disposition: Home Discharge Instructions    Diet - low sodium heart healthy    Complete by:  As directed      Increase activity slowly    Complete by:  As directed           Follow-up Information    Follow up with [facility] On 04/06/2015.   Specialty:  Cardiology   Why:  3:45 PM for lab draw, 4:00 PM, wound check   Contact information:   [address] [place] [address] [phone]      Follow up with Lewayne Bunting, MD On 06/24/2015.   Specialty:  Cardiology   Why:   11:30AM   Contact information:   [address] [place] [address] [phone]       Follow up with [facility].   Why:  HHRN for Disease Management of CHF   Contact information:   [address] [place] [address] [phone]       Duration of Discharge Encounter: Greater than 30 minutes including physician time.  Norma Fredrickson, PA-C 03/25/2015 12:15 PM  EP Attending  Patient seen and examined. Agree with findings as noted above. Her nausea is chronic and has resolved. Will discharge home with usual followup. She will need a script for zofran.  Myiesha Edgar,M.D.  Ammend: The patient is scheduled for BMET at her wound check and instructed not to take her lasix today given her renal function. Home health RN was recommended by admitting service for CHF screening and evaluation.  This is arranged.  Francis Dowse, PA-C  Leonia Reeves.D.

## 2015-03-24 NOTE — Code Documentation (Signed)
Patient placed in Room HR dropped to 20 MD gave verbal order Atropine. Patient sweating. Patient Alert and Oriented.

## 2015-03-24 NOTE — H&P (Signed)
Patient ID: Tara Jackson MRN: [id number] DOB/AGE: 77-Mar-1942 77 y.o.  Admit date: 03/24/2015 Primary Physician   Gwen Pounds, MD  Primary Cardiologist   Bryan Lemma, MD Chief Complaint    bradycardia   HPI:  Tara Jackson is a pleasant 75F with hypertension, PE, diabetes, and atypical chest pain with normal coronary arteries here with symptomatic bradycardia.  Tara Jackson reports developing chest pain and lightheadedness today.  Symptoms began at rest and were worse with walking.  She reports similar symptoms in October, when she was admitted to [facility] with bradycardia to the 30s.  At that time she had an echo that revealed LVEF 55-60% and grade 1 diastolic dysfunction.  Nuclear stress testing was negative for ischemia.  Propranolol was discontinued and she wore an outpatient monitor for 30 days, which revealed sinus bradycardia with 1st degree AV block and a 2 second pause.    Tara Jackson presented to the ED today and her heart rate was again in the 30s.  She was weak and short of breath. She no longer endorsed chest pain.  EKG revealed sinus bradycardia without ischemic changes.  POC troponin was 0.00.  Potassium was 4.2.  She has not been taking any nodal agents.   Review of Systems:  A 12 point review of systems was obtained and was negative with exceptions as noted in the HPI.  Past Medical History  Diagnosis Date  . DVT (deep venous thrombosis) (HCC)   . Asthma   . Hypothyroidism   . Anxiety   . Depression   . H/O hiatal hernia   . Arthritis   . Anemia   . PE (pulmonary thromboembolism) (HCC) x 3, last 14 years ago    history of recurrent RLE DVT with PE - last PE ~>13 yrs; Maintained on Plavix  . Hyperlipidemia   . Diabetes mellitus type 2, insulin dependent (HCC)   . Hypertension associated with diabetes (HCC)   . Glaucoma   . Cataract   . Diabetic neuropathy (HCC)   . Palpitations 75years ago    Cardionet monitor - revealed mostly normal sinus rhythm, sinus  bradycardia with first-degree A-V block heart rates mostly in the 77s with some 70s. No arrhythmias, PVCs or PACs noted.  Marland Kitchen TIA (transient ischemic attack) 77/2014  . Diastolic dysfunction, left ventricle 77/23/16    EF 55%, grade 1 DD  . Stroke [facility]) 04-19-2013    tia and stroke. Weakness Rt hand  . Sleep apnea     cpap disontinued; test done 07/14/2008 ordered per  [facility]  . Restless legs syndrome   . Spinal stenosis     L 3, L 4 and L 5, and C 1, C 2 and C3  . Complication of anesthesia     hard to wake up after anesthesia, trouble turning head  . Spinal headache   . Memory loss 06/03/2014  . Osteoporosis   . Normal cardiac stress test 77/23/16    Negative Myoview     (Not in a hospital admission)   Allergies  Allergen Reactions  . Iodine Other (See Comments)    ONLY IV--Makes unconscious  . Metoclopramide Hcl Other (See Comments)    suicidal  . Sulfa Antibiotics Hives  . Iohexol      Code: SOB, Desc: cardiac arrest w/ iv contrast, has never used 13 hr prep//alice calhoun, Onset Date: [id number]   . Lactose Intolerance (Gi) Diarrhea  . Lansoprazole Nausea And Vomiting  Social History   Social History  . Marital Status: Married    75F  . 75F  . Years of Education: college   Occupational History  . retired    Social History Main Topics  . Smoking status: Never Smoker   . Smokeless tobacco: Never Used  . Alcohol Use: No  . Drug Use: No  . Sexual Activity: Yes   Other Topics Concern  . Not on file   Social History Narrative   Married Fayrene Fearing.) married 56 years.   Disabled.   Financial controller.   Right handed.   Caffeine two sodas daily.        Family History  Problem Relation Age of Onset  . Cancer Mother   . Cancer - Prostate Father     PHYSICAL EXAM: Filed Vitals:   03/24/15 1430 03/24/15 1433  BP: 122/49   Pulse: 30 42  Resp: 14 15   General:  Ill-appearing. Laying flat in bed in mild  respiratory distress HEENT: normal Neck: supple. no JVD. Carotids 2+ bilat; no bruits. No lymphadenopathy or thryomegaly appreciated. Cor: PMI nondisplaced. Bradycardic. Regular rhythm No rubs, gallops or murmurs. Lungs: clear to ausculation bilaterally Abdomen: soft, nontender, nondistended. No hepatosplenomegaly. No bruits or masses. Good bowel sounds. Extremities: no cyanosis, clubbing, rash, edema Neuro: alert & oriented x 3, cranial nerves grossly intact. moves all 4 extremities w/o difficulty. Affect pleasant.   Results for orders placed or performed during the hospital encounter of 03/24/15 (from the past 24 hour(s))  CBG monitoring, ED     Status: Abnormal   Collection Time: 03/24/15  2:03 PM  Result Value Ref Range   Glucose-Capillary 295 (H) 65 - 99 mg/dL  CBC with Differential     Status: Abnormal   Collection Time: 03/24/15  2:09 PM  Result Value Ref Range   WBC 7.2 4.0 - 10.5 K/uL   RBC 3.48 (L) 3.87 - 5.11 MIL/uL   Hemoglobin 10.7 (L) 12.0 - 15.0 g/dL   HCT 52.8 (L) 41.3 - 24.4 %   MCV 95.7 78.0 - 100.0 fL   MCH 30.7 26.0 - 34.0 pg   MCHC 32.1 30.0 - 36.0 g/dL   RDW 01.0 27.2 - 53.6 %   Platelets 288 150 - 400 K/uL   Neutrophils Relative % 62 %   Neutro Abs 4.5 1.7 - 7.7 K/uL   Lymphocytes Relative 31 %   Lymphs Abs 2.2 0.7 - 4.0 K/uL   Monocytes Relative 5 %   Monocytes Absolute 0.3 0.1 - 1.0 K/uL   Eosinophils Relative 3 %   Eosinophils Absolute 0.2 0.0 - 0.7 K/uL   Basophils Relative 1 %   Basophils Absolute 0.0 0.0 - 0.1 K/uL  I-stat troponin, ED     Status: None   Collection Time: 03/24/15  2:30 PM  Result Value Ref Range   Troponin i, poc 0.00 0.00 - 0.08 ng/mL   Comment 3          I-stat Chem 8, ED     Status: Abnormal   Collection Time: 03/24/15  2:32 PM  Result Value Ref Range   Sodium 137 135 - 145 mmol/L   Potassium 4.2 3.5 - 5.1 mmol/L   Chloride 102 101 - 111 mmol/L   BUN 30 (H) 6 - 20 mg/dL   Creatinine, Ser 6.44 (H) 0.44 - 1.00 mg/dL    Glucose, Bld 034 (H) 65 - 99 mg/dL   Calcium, Ion 7.42 (L) 1.13 -  1.30 mmol/L   TCO2 22 0 - 100 mmol/L   Hemoglobin 11.6 (L) 12.0 - 15.0 g/dL   HCT 40.9 (L) 81.1 - 91.4 %   No results found.   ECG: Sinus bradycardia.  Rate 30 bpm.      ASSESSMENT/PLAN:  # Bradycardia: Tara Jackson presents with symptomatic sinus bradycardia, rates in the 30s.  She has no clear cause, with K 4.2 and LVEF >55% in October.  Given that she is not on any nodal agents and troponin is negative with no ischemic changes on EKG, it is reasonable to proceed with PPM implantation.  Dr. Ladona Ridgel is amenable to proceeding with this plan.  Will start dopamine to temporize until she can get to the EP lab.  She is on plavix for unclear reason.  She does not have obstructive coronary disease or stents.  This can be held as needed for PPM implantation.  # Chronic diastolic heart failure:  She is euvolemic at this time. Continue lasix 40 mg daily and valsartan.  # Hypertension:  Blood pressure well-controlled.  Continue home ARB.  # DM type 2: Hold home glipizide.  Continue NPH/Regular insulin +SSI.  Signed: Tyreisha Ungar C. Duke Salvia, MD, [facility]  03/24/2015, 3:07 PM

## 2015-03-24 NOTE — Consult Note (Signed)
ELECTROPHYSIOLOGY CONSULT NOTE    Patient ID: Tara Jackson MRN: [id number], DOB/AGE: November 25, 1940 75 y.o.  Admit date: 03/24/2015 Date of Consult: 03/24/2015  Primary Physician: Gwen Pounds, MD Primary Cardiologist: Dr. Herbie Baltimore  Reason for Consultation: symptomatic bradycardia   HPI: Tara Jackson is a 75 y.o. female with PMHx in review of her record she has hx of abnormal Myoview with Cath done jan 2014 showed normal LVF and normal coronaries. She has had multiple medical issues including IDDM, CRI, HTN, remote h/o recurrent DVT and a PE, and remotely on anticoagulation, being maintained on Plavix,  TIA, and sleep apnea, hypothyroidism, . She was admitted 11/29/14 after she presented to [facility] with chest pain and a low HR "30". EKG on that admission showed HR 45. She had been on Propanolol and this was stopped.  Her echo at that time noted EF 55-60%, grade I diastolic dysfunction, she also had a myoview done without ischemia, EF 60%, was a low risk study.  She was seen by Corine Shelter, PA-C in f/u for cardiology in Dec 2016, the patient relayed to him concerns about HR remaining in the 30's at times by he BP machine read-out though no clear symptoms, and an event monitor was planned.  She wore the monitor, noting the result as essentially normal, Sinus Bradycardia w/1st Degree AVB/Artifact, otherwise NSR w/1st Degree AVB/Artifact.  No evidence of arrhythmia or significant PVCs/PACs.  She carries hx of recurrent PE's historically on anticoagulation.   She comes to the ER today with c/o CP, Dr. Duke Salvia was evaluating the patient in the ED not felt to have an ischemic component with initial Trop of 0.00, no ischemic EKG changes, and normal stress test only a couple months ago, normal coronaries in 2014 by record.  The patient states she woke today feeling very weak, SOB with exertion,  tired and lightheaded without reports of syncope.  Her HR in the ER was noted to be in the 30's  (sinus brady) .  She was treated with atropine without much response, Her BP was stable and she maintained good mentation, she placed on dopamine gtt for HR support, which she remains on at this time.  EP is asked by Dr.Imperial to evaluate for PPM implantation.  She c/o nausea, no active CP.  Past Medical History  Diagnosis Date  . DVT (deep venous thrombosis) (HCC)   . Asthma   . Hypothyroidism   . Anxiety   . Depression   . H/O hiatal hernia   . Arthritis   . Anemia   . PE (pulmonary thromboembolism) (HCC) x 3, last 14 years ago    history of recurrent RLE DVT with PE - last PE ~>13 yrs; Maintained on Plavix  . Hyperlipidemia   . Diabetes mellitus type 2, insulin dependent (HCC)   . Hypertension associated with diabetes (HCC)   . Glaucoma   . Cataract   . Diabetic neuropathy (HCC)   . Palpitations 35years ago    Cardionet monitor - revealed mostly normal sinus rhythm, sinus bradycardia with first-degree A-V block heart rates mostly in the 50s and 60s with some 70s. No arrhythmias, PVCs or PACs noted.  Marland Kitchen TIA (transient ischemic attack) March 2014  . Diastolic dysfunction, left ventricle 11/30/14    EF 55%, grade 1 DD  . Stroke [facility]) 04-19-2013    tia and stroke. Weakness Rt hand  . Sleep apnea     cpap disontinued; test done 07/14/2008 ordered per  [facility]rum  .  Restless legs syndrome   . Spinal stenosis     L 3, L 4 and L 5, and C 1, C 2 and C3  . Complication of anesthesia     hard to wake up after anesthesia, trouble turning head  . Spinal headache   . Memory loss 06/03/2014  . Osteoporosis   . Normal cardiac stress test 11/30/14    Negative Myoview     Surgical History:  Past Surgical History  Procedure Laterality Date  . Abdominal hysterectomy    . Cholecystectomy    . Back surgery      steroid inj  . Tonsillectomy    . Appendectomy    . Esophagogastroduodenoscopy  08/09/2011    Procedure: ESOPHAGOGASTRODUODENOSCOPY (EGD);  Surgeon: Petra Kuba, MD;  Location: [facility]  ENDOSCOPY;  Service: Endoscopy;  Laterality: N/A;  . Hot hemostasis  08/09/2011    Procedure: HOT HEMOSTASIS (ARGON PLASMA COAGULATION/BICAP);  Surgeon: Petra Kuba, MD;  Location: [facility] ENDOSCOPY;  Service: Endoscopy;  Laterality: N/A;  . Trigger finger release  11/22/2011    Procedure: RELEASE TRIGGER FINGER/A-1 PULLEY;  Surgeon: Cammy Copa, MD;  Location: [facility] OR;  Service: Orthopedics;  Laterality: Left;  Left trigger thumb release  . Cardiac catheterization  02/14/2012    no evidence of obstructive coronary disease ,low EDP with normal EF  . Doppler echocardiography  04/19/2012    EF 55-60%; normal diastolic pressures, no regional wall motion motion abnormalities  . Nm myocar perf wall motion  01/26/2012    EF 79%,LV normal,ST SEGMENT CHANGE SUGGESTIVE OF ISCHEMIA.  Felicity Coyer doppler  05/29/2012    RIGHT EXTREM. NORMAL VENOUS DUPLEX  . Joint replacement      right knee x 1, left knee x 2  . Left knee spacer bar placement  done with 2nd left knee replacement  . Eye surgery Bilateral 4-5-yrs ago  . Right trigger finger release  yrs ago  . Esophagogastroduodenoscopy (egd) with propofol N/A 11/12/2013    Procedure: ESOPHAGOGASTRODUODENOSCOPY (EGD) WITH PROPOFOL;  Surgeon: Petra Kuba, MD;  Location: [facility] ENDOSCOPY;  Service: Endoscopy;  Laterality: N/A;  . Hot hemostasis N/A 11/12/2013    Procedure: HOT HEMOSTASIS (ARGON PLASMA COAGULATION/BICAP);  Surgeon: Petra Kuba, MD;  Location: [facility] ENDOSCOPY;  Service: Endoscopy;  Laterality: N/A;  . Left heart catheterization with coronary angiogram N/A 02/14/2012    Procedure: LEFT HEART CATHETERIZATION WITH CORONARY ANGIOGRAM;  Surgeon: Marykay Lex, MD;  Location: [facility] CATH LAB;  Service: Cardiovascular;  Laterality: N/A;      (Not in a hospital admission)  Inpatient Medications:  . atropine  1 mg Intravenous Once    Allergies:  Allergies  Allergen Reactions  . Iodine Other (See Comments)    ONLY IV--Makes unconscious  . Metoclopramide Hcl  Other (See Comments)    suicidal  . Sulfa Antibiotics Hives  . Iohexol      Code: SOB, Desc: cardiac arrest w/ iv contrast, has never used 13 hr prep//alice calhoun, Onset Date: [id number]   . Lactose Intolerance (Gi) Diarrhea  . Lansoprazole Nausea And Vomiting    Social History   Social History  . Marital Status: Married    Spouse Name: Fayrene Fearing  . Number of Children: 2  . Years of Education: college   Occupational History  . retired    Social History Main Topics  . Smoking status: Never Smoker   . Smokeless tobacco: Never Used  . Alcohol Use: No  . Drug Use: No  .  Sexual Activity: Yes   Other Topics Concern  . Not on file   Social History Narrative   Married Fayrene Fearing.) married 56 years.   Disabled.   Financial controller.   Right handed.   Caffeine two sodas daily.         Family History  Problem Relation Age of Onset  . Cancer Mother   . Cancer - Prostate Father      Review of Systems: All other systems reviewed and are otherwise negative except as noted above.  Physical Exam: Filed Vitals:   03/24/15 1430 03/24/15 1433 03/24/15 1445 03/24/15 1500  BP: 122/49  102/50 116/86  Pulse: 30 42 51 34  TempSrc:      Resp: 14 15 21 17   SpO2: 97% 98% 91% 95%    GEN- The patient is obese, currently with nausea, in NAD, alert and oriented x 3 today.   HEENT: normocephalic, atraumatic; sclera clear, conjunctiva pink; hearing intact; oropharynx clear; neck supple, no JVP Lymph- no cervical lymphadenopathy Lungs- Clear to ausculation bilaterally, normal work of breathing.  No wheezes, rales, rhonchi Heart- Bradycardic, regular rate and rhythm, no murmurs, rubs or gallops, PMI not laterally displaced GI- soft, non-tender, non-distended, bowel sounds present, obese Extremities- no clubbing, cyanosis, or edema MS- no significant deformity or atrophy Skin- warm and dry, no rash or lesion Psych- euthymic mood, full affect Neuro- no gross deficits observed  Labs:   Lab  Results  Component Value Date   WBC 7.2 03/24/2015   HGB 11.6* 03/24/2015   HCT 34.0* 03/24/2015   MCV 95.7 03/24/2015   PLT 288 03/24/2015    Recent Labs Lab 03/24/15 1409 03/24/15 1432  NA 137 137  K 4.3 4.2  CL 104 102  CO2 19*  --   BUN 29* 30*  CREATININE 1.97* 1.90*  CALCIUM 9.2  --   PROT 6.4*  --   BILITOT 0.6  --   ALKPHOS 56  --   ALT 14  --   AST 27  --   GLUCOSE 330* 335*      Radiology/Studies: No results found.  EKG: SB, 30bpm  TELEMETRY: SB, 30's    Assessment and Plan:  1. Symptomatic bradycardia      TSH wnl April 2016      Off Propanolol since October 2016      No reversible causes identified      BP is stable      Recommend PPM implantation       2. HTN     Appears well controlled  3. DM  4. CRI  5. Hx of diastolic dysfunction     Exam negative for evidence of CHF        Signed, Francis Dowse, Cordelia Poche 03/24/2015   EP Attending  Patient seen and examined. She has a h/o sinus node dysfunction and has been off of all Sinus and AV nodal blocking drugs. She now presents with symptomatic sinus node dysfunction and an initial HR of 30/min. Her exam demonstrates an ill appearing woman, nauseated with a Reg bradycardia and no edema. No abdominal tenderness. Extremities are cool. A/P 1. Sinus node dysfunction - I have discussed the risks/benefits/goals/expectations of PPM insertion for sinus node dysfunction and she wishes to proceed.  2. Obesity - she will need to work on her weight. 3. DM - her blood sugar today is high. Will check HgbA1C.  Amariyon Maynes,M.D.  3:21 PM

## 2015-03-24 NOTE — Code Documentation (Signed)
Family at beside. Family given emotional support. 

## 2015-03-24 NOTE — ED Provider Notes (Signed)
CSN: [id number]     Arrival date & time 03/24/15  1347 History   First MD Initiated Contact with Patient 03/24/15 1359     Chief Complaint  Patient presents with  . Chest Pain  . Bradycardia   History provided by patient and husband at bedside.  (Consider location/radiation/quality/duration/timing/severity/associated sxs/prior Treatment) HPI  Patient reports symptoms with chest pain and acute on chronic shortness of breath, onset about 1030 today while sitting without exertion, describes CP as lower sternal to left side and epigastric with "pressure and tightness" without significant radiation, family checked BP initially low in 90s/50s, she took ASA 325mg  x 1 dose. Chest pain persisted, and did not improve, called EMS. On arrival to ED, patient had received an ASA en route, did not receive any other treatments. Admits persistent CP on evaluation without improvement, shortness of breath that was worse on exertion but able to catch breath resting in bed. Currently with bradycardia. - Significant related history with recent hospitalization 11/2014 for similar chest pain episode also with bradycardia reportedly HR down to 30s at that time. She is followed by [facility], had Myoview stress test 11/2014 (negative) and last ECHO 11/2014 with HFpEF. Also significant history with recurrent chronic PE previously on chronic anticoagulation, now states only takes Plavix - Recently had been well without chest pain since 11/2014. No recent illnesses. Tolerating PO  Past Medical History  Diagnosis Date  . DVT (deep venous thrombosis) ([place])   . Asthma   . Hypothyroidism   . Anxiety   . Depression   . H/O hiatal hernia   . Arthritis   . Anemia   . PE (pulmonary thromboembolism) (HCC) x 3, last 14 years ago    history of recurrent RLE DVT with PE - last PE ~>13 yrs; Maintained on Plavix  . Hyperlipidemia   . Diabetes mellitus type 2, insulin dependent ([place])   . Hypertension associated with  diabetes ([place])   . Glaucoma   . Cataract   . Diabetic neuropathy ([place])   . Palpitations 35years ago    Cardionet monitor - revealed mostly normal sinus rhythm, sinus bradycardia with first-degree A-V block heart rates mostly in the 50s and 60s with some 70s. No arrhythmias, PVCs or PACs noted.  Marland Kitchen TIA (transient ischemic attack) March 2014  . Diastolic dysfunction, left ventricle 11/30/14    EF 55%, grade 1 DD  . Stroke [facility]) 04-19-2013    tia and stroke. Weakness Rt hand  . Sleep apnea     cpap disontinued; test done 07/14/2008 ordered per  Byrum  . Restless legs syndrome   . Spinal stenosis     L 3, L 4 and L 5, and C 1, C 2 and C3  . Complication of anesthesia     hard to wake up after anesthesia, trouble turning head  . Spinal headache   . Memory loss 06/03/2014  . Osteoporosis   . Normal cardiac stress test 11/30/14    Negative Myoview   Past Surgical History  Procedure Laterality Date  . Abdominal hysterectomy    . Cholecystectomy    . Back surgery      steroid inj  . Tonsillectomy    . Appendectomy    . Esophagogastroduodenoscopy  08/09/2011    Procedure: ESOPHAGOGASTRODUODENOSCOPY (EGD);  Surgeon: Jeryl Columbia, MD;  Location: [facility];  Service: Endoscopy;  Laterality: N/A;  . Hot hemostasis  08/09/2011    Procedure: HOT HEMOSTASIS (ARGON PLASMA COAGULATION/BICAP);  Surgeon: Jeryl Columbia,  MD;  Location: [facility] ENDOSCOPY;  Service: Endoscopy;  Laterality: N/A;  . Trigger finger release  11/22/2011    Procedure: RELEASE TRIGGER FINGER/A-1 PULLEY;  Surgeon: Meredith Pel, MD;  Location: [place];  Service: Orthopedics;  Laterality: Left;  Left trigger thumb release  . Cardiac catheterization  02/14/2012    no evidence of obstructive coronary disease ,low EDP with normal EF  . Doppler echocardiography  04/19/2012    EF 0000000; normal diastolic pressures, no regional wall motion motion abnormalities  . Nm myocar perf wall motion  01/26/2012    EF 79%,LV normal,ST SEGMENT  CHANGE SUGGESTIVE OF ISCHEMIA.  Eusebio Me doppler  05/29/2012    RIGHT EXTREM. NORMAL VENOUS DUPLEX  . Joint replacement      right knee x 1, left knee x 2  . Left knee spacer bar placement  done with 2nd left knee replacement  . Eye surgery Bilateral 4-5-yrs ago  . Right trigger finger release  yrs ago  . Esophagogastroduodenoscopy (egd) with propofol N/A 11/12/2013    Procedure: ESOPHAGOGASTRODUODENOSCOPY (EGD) WITH PROPOFOL;  Surgeon: Jeryl Columbia, MD;  Location: [facility] ENDOSCOPY;  Service: Endoscopy;  Laterality: N/A;  . Hot hemostasis N/A 11/12/2013    Procedure: HOT HEMOSTASIS (ARGON PLASMA COAGULATION/BICAP);  Surgeon: Jeryl Columbia, MD;  Location: [facility];  Service: Endoscopy;  Laterality: N/A;  . Left heart catheterization with coronary angiogram N/A 02/14/2012    Procedure: LEFT HEART CATHETERIZATION WITH CORONARY ANGIOGRAM;  Surgeon: Leonie Man, MD;  Location: [facility] CATH LAB;  Service: Cardiovascular;  Laterality: N/A;   Family History  Problem Relation Age of Onset  . Cancer Mother   . Cancer - Prostate Father    Social History  Substance Use Topics  . Smoking status: Never Smoker   . Smokeless tobacco: Never Used  . Alcohol Use: No   OB History    No data available     Review of Systems  See above HPI Denies any recent fevers/chills, diaphoresis, nausea, vomiting, abdominal pain, diarrhea, rectal bleeding, dark stools, HA, syncope  Allergies  Iodine; Metoclopramide hcl; Sulfa antibiotics; Iohexol; Lactose intolerance (gi); and Lansoprazole  Home Medications   Prior to Admission medications   Medication Sig Start Date End Date Taking? Authorizing Provider  albuterol (PROVENTIL HFA;VENTOLIN HFA) 108 (90 BASE) MCG/ACT inhaler Inhale 2 puffs into the lungs every 6 (six) hours as needed for wheezing.    Historical Provider, MD  busPIRone (BUSPAR) 10 MG tablet Take 1 tablet (10 mg total) by mouth every morning. 06/03/14   Garvin Fila, MD  Cholecalciferol (VITAMIN D-3  PO) Take 1 tablet by mouth every morning.     Historical Provider, MD  clonazePAM (KLONOPIN) 0.5 MG tablet 0.5-1 mg 2 (two) times daily. 1 mg (2 tablets) in the morning and 0.5 mg (1 tablet) at night    Historical Provider, MD  clopidogrel (PLAVIX) 75 MG tablet Take 75 mg by mouth every morning.    Historical Provider, MD  diclofenac sodium (VOLTAREN) 1 % GEL Apply 2 g topically 3 (three) times daily as needed (to affected area). 11/30/14   Bonnielee Haff, MD  dicyclomine (BENTYL) 10 MG capsule Take 10 mg by mouth 3 (three) times daily as needed for spasms.     Historical Provider, MD  esomeprazole (NEXIUM) 40 MG capsule Take 40 mg by mouth daily before breakfast.    Historical Provider, MD  furosemide (LASIX) 40 MG tablet Take 40 mg by mouth.    Historical Provider,  MD  glipiZIDE (GLUCOTROL) 10 MG tablet Take 20 mg by mouth 2 (two) times daily before a meal.  05/03/14   Historical Provider, MD  insulin NPH (HUMULIN N,NOVOLIN N) 100 UNIT/ML injection Inject 30 Units into the skin daily before breakfast. Novolin-N    Historical Provider, MD  insulin regular (NOVOLIN R,HUMULIN R) 100 units/mL injection Inject 0.05 mLs (5 Units total) into the skin 3 (three) times daily before meals. 04/21/14   Modena Jansky, MD  levothyroxine (SYNTHROID, LEVOTHROID) 100 MCG tablet Take 100 mcg by mouth every morning.     Historical Provider, MD  lidocaine (LIDODERM) 5 % Place 3 patches onto the skin daily. Remove & Discard patch within 12 hours or as directed by MD    Historical Provider, MD  Linaclotide (LINZESS) 290 MCG CAPS capsule Take 290 mcg by mouth every morning.     Historical Provider, MD  LORazepam (ATIVAN) 0.5 MG tablet TK 1 T PO AS NEEDED FOR ANXIETY ATTACK 09/10/14   Historical Provider, MD  meclizine (ANTIVERT) 12.5 MG tablet Take 12.5 mg by mouth 3 (three) times daily as needed for dizziness.     Historical Provider, MD  nortriptyline (PAMELOR) 50 MG capsule Take 50 mg by mouth at bedtime.    Historical  Provider, MD  polyethylene glycol (MIRALAX / GLYCOLAX) packet Take 17 g by mouth daily as needed for mild constipation.    Historical Provider, MD  potassium chloride (MICRO-K) 10 MEQ CR capsule Take 10 mEq by mouth daily as needed (leg cramps).     Historical Provider, MD  rosuvastatin (CRESTOR) 20 MG tablet Take 20 mg by mouth every morning.     Historical Provider, MD  Sennosides (EX-LAX PO) Take 1 tablet by mouth daily as needed (constipation).     Historical Provider, MD  sucralfate (CARAFATE) 1 G tablet Take 1-2 g by mouth daily.     Historical Provider, MD  terconazole (TERAZOL 7) 0.4 % vaginal cream Place 1 applicator vaginally at bedtime.  04/18/14   Historical Provider, MD  valsartan (DIOVAN) 160 MG tablet Take 1 tablet (160 mg total) by mouth daily. 01/09/15   Isaiah Serge, NP   BP 116/86 mmHg  Pulse 34  Resp 17  SpO2 95% Physical Exam  Constitutional: She is oriented to person, place, and time. She appears well-developed and well-nourished. No distress.  Morbidly obese, chronically ill appearing but seems comfortable despite persistent chest pain and bradycardia  HENT:  Head: Normocephalic and atraumatic.  Mouth/Throat: Oropharynx is clear and moist.  Eyes: Conjunctivae and EOM are normal. Pupils are equal, round, and reactive to light.  Neck: Normal range of motion. Neck supple.  Cardiovascular: Normal heart sounds and intact distal pulses.   No murmur heard. Regular HR with severe bradycardia ranging 20s to 40s, mostly in 30s.  Pulmonary/Chest: Effort normal and breath sounds normal. No respiratory distress.  Reduced air movement with large body habitus, some reduced effort due to active chest pain. No obvious crackles or wheezing.  Abdominal: Soft. Bowel sounds are normal. She exhibits no distension. There is no tenderness.  Obese abdomen  Musculoskeletal: She exhibits no edema (chronic bilateral lower ext non-pitting edema).  Neurological: She is alert and oriented to  person, place, and time.  Skin: Skin is warm and dry. No rash noted. She is not diaphoretic.  Psychiatric: She has a normal mood and affect. Her behavior is normal.  Nursing note and vitals reviewed.   ED Course  Procedures (including critical care  time) Labs Review Labs Reviewed  COMPREHENSIVE METABOLIC PANEL - Abnormal; Notable for the following:    CO2 19 (*)    Glucose, Bld 330 (*)    BUN 29 (*)    Creatinine, Ser 1.97 (*)    Total Protein 6.4 (*)    Albumin 3.1 (*)    GFR calc non Af Amer 23 (*)    GFR calc Af Amer 27 (*)    All other components within normal limits  CBC WITH DIFFERENTIAL/PLATELET - Abnormal; Notable for the following:    RBC 3.48 (*)    Hemoglobin 10.7 (*)    HCT 33.3 (*)    All other components within normal limits  I-STAT CHEM 8, ED - Abnormal; Notable for the following:    BUN 30 (*)    Creatinine, Ser 1.90 (*)    Glucose, Bld 335 (*)    Calcium, Ion 1.11 (*)    Hemoglobin 11.6 (*)    HCT 34.0 (*)    All other components within normal limits  CBG MONITORING, ED - Abnormal; Notable for the following:    Glucose-Capillary 295 (*)    All other components within normal limits  TROPONIN I  I-STAT TROPOININ, ED    Imaging Review No results found. I have personally reviewed and evaluated these images and lab results as part of my medical decision-making.   EKG Interpretation   Date/Time:  Tuesday March 24 2015 13:54:25 EST Ventricular Rate:  30 PR Interval:  207 QRS Duration: 117 QT Interval:  514 QTC Calculation: 363 R Axis:   5 Text Interpretation:  Bradycardia with irregular rate Nonspecific  intraventricular conduction delay Confirmed by ZAVITZ  MD, JOSHUA ([id number])  on 03/24/2015 2:03:13 PM      MDM   Final diagnoses:  Symptomatic bradycardia  Chest pain, unspecified chest pain type   25 yr AAF PMH Morbid obesity, IDDM, CRI, HTN, h/o recurrent PE, prior bradycardia, without known CAD (last cath 2014 normal coronaries) presents  with chest pain and SOB. In ED found to have symptomatic bradycardia to HR 20-40s with active CP, initial EKG confirm bradycardia (appeared as sinus to start but then concern for some dropped beats). Initially with brief episode of asystole for 2 seconds with patient having one gasp and then she came to with resumed pulse in 20s, administered Atropine 1mg  x 1 dose with improved HR to 52, then gradual reduced HR down, CP and SOB unchanged. BP initial range 0000000 systolic. Repeat Atropine 1mg  at 1430. Ordered i-stat CHEM 8, i-stat troponin, and CMET, CBC, Troponin-I for anticipated admission. Did not obtain CXR acutely given potential need for pacing.   UPDATE 1455 Reviewed results with CHEM-8 with normal electrolytes, K 4.2, BUN 30 and Cr 1.90 (elevated from prior 1.6, may have AKI), some mild dehydration on exam. Initial i-stat trop 0.00, reassuring. Reviewed cardiac monitoring rhythm strips over several different periods from 1404 to 1407 with rhythm appearing to have P waves consistent with Sinus Bradycardia, however there do appear to be occasional dropped QRS complexes suggestive of Type 2 Heart Block. Considered external pacing due to persistent bradycardia, however BP remained stable with [id number] systolic. [facility], attending Dr Oval Linsey saw patient in ED, to accept patient for admission and patient to be taken to cardiac cath lab. Temp admit orders placed for SDU status.  Olin Hauser, DO 03/24/15 1525  Elnora Morrison, MD 03/24/15 [phone]

## 2015-03-24 NOTE — ED Notes (Signed)
Cardiology at bedside,  

## 2015-03-24 NOTE — ED Notes (Signed)
CBG 295

## 2015-03-24 NOTE — Progress Notes (Signed)
HR 54, BP 99/28, RR 23 on Dopamine 61mcg. Attempted to use bedpan. Requested foley catheter. Order obtained from Dr. Lovena Le. Attempted to insert foley w/o success. Used bedpan again, voided ~30cc concentrated uop. Stated that she felt like she needed to have a BM.

## 2015-03-24 NOTE — Discharge Instructions (Signed)
° ° °  Supplemental Discharge Instructions for  Pacemaker/Defibrillator Patients  Activity No heavy lifting or vigorous activity with your left/right arm for 6 to 8 weeks.  Do not raise your left/right arm above your head for one week.  Gradually raise your affected arm as drawn below.             03/29/15                    03/30/15                     03/31/15                  04/01/15 __  NO DRIVING for 1 week     ; you may begin driving on  [id number]   .  WOUND CARE - Keep the wound area clean and dry.  Do not get this area wet for one week. No showers for one week; you may shower on  04/01/15   . - The tape/steri-strips on your wound will fall off; do not pull them off.  No bandage is needed on the site.  DO  NOT apply any creams, oils, or ointments to the wound area. - If you notice any drainage or discharge from the wound, any swelling or bruising at the site, or you develop a fever > 101? F after you are discharged home, call the office at once.  Special Instructions - You are still able to use cellular telephones; use the ear opposite the side where you have your pacemaker/defibrillator.  Avoid carrying your cellular phone near your device. - When traveling through airports, show security personnel your identification card to avoid being screened in the metal detectors.  Ask the security personnel to use the hand wand. - Avoid arc welding equipment, MRI testing (magnetic resonance imaging), TENS units (transcutaneous nerve stimulators).  Call the office for questions about other devices. - Avoid electrical appliances that are in poor condition or are not properly grounded. - Microwave ovens are safe to be near or to operate.  Additional information for defibrillator patients should your device go off: - If your device goes off ONCE and you feel fine afterward, notify the device clinic nurses. - If your device goes off ONCE and you do not feel well afterward, call 911. - If your device  goes off TWICE, call 911. - If your device goes off THREE times in one day, call 911.  DO NOT DRIVE YOURSELF OR A FAMILY MEMBER WITH A DEFIBRILLATOR TO THE HOSPITAL--CALL 911.

## 2015-03-24 NOTE — ED Notes (Signed)
Verbal order given by Cardology MD start [facility] AT [facility].

## 2015-03-24 NOTE — ED Notes (Signed)
MD advised prepare for external pacing due to HR 34

## 2015-03-25 ENCOUNTER — Ambulatory Visit (HOSPITAL_COMMUNITY): Payer: Medicare HMO

## 2015-03-25 ENCOUNTER — Encounter (HOSPITAL_COMMUNITY): Payer: Self-pay | Admitting: Internal Medicine

## 2015-03-25 ENCOUNTER — Other Ambulatory Visit: Payer: Self-pay | Admitting: Physician Assistant

## 2015-03-25 DIAGNOSIS — N189 Chronic kidney disease, unspecified: Secondary | ICD-10-CM

## 2015-03-25 DIAGNOSIS — I440 Atrioventricular block, first degree: Secondary | ICD-10-CM | POA: Diagnosis not present

## 2015-03-25 DIAGNOSIS — I509 Heart failure, unspecified: Secondary | ICD-10-CM | POA: Diagnosis not present

## 2015-03-25 DIAGNOSIS — R079 Chest pain, unspecified: Secondary | ICD-10-CM | POA: Diagnosis not present

## 2015-03-25 DIAGNOSIS — Z950 Presence of cardiac pacemaker: Secondary | ICD-10-CM | POA: Diagnosis not present

## 2015-03-25 DIAGNOSIS — I495 Sick sinus syndrome: Secondary | ICD-10-CM | POA: Diagnosis not present

## 2015-03-25 DIAGNOSIS — R0602 Shortness of breath: Secondary | ICD-10-CM | POA: Diagnosis not present

## 2015-03-25 DIAGNOSIS — I10 Essential (primary) hypertension: Secondary | ICD-10-CM | POA: Diagnosis not present

## 2015-03-25 LAB — HEPATIC FUNCTION PANEL
ALT: 15 U/L (ref 14–54)
AST: 29 U/L (ref 15–41)
Albumin: 3.4 g/dL — ABNORMAL LOW (ref 3.5–5.0)
Alkaline Phosphatase: 58 U/L (ref 38–126)
Bilirubin, Direct: 0.2 mg/dL (ref 0.1–0.5)
Indirect Bilirubin: 0.4 mg/dL (ref 0.3–0.9)
Total Bilirubin: 0.6 mg/dL (ref 0.3–1.2)
Total Protein: 6.9 g/dL (ref 6.5–8.1)

## 2015-03-25 LAB — BASIC METABOLIC PANEL
Anion gap: 11 (ref 5–15)
BUN: 31 mg/dL — ABNORMAL HIGH (ref 6–20)
CO2: 24 mmol/L (ref 22–32)
Calcium: 8.8 mg/dL — ABNORMAL LOW (ref 8.9–10.3)
Chloride: 103 mmol/L (ref 101–111)
Creatinine, Ser: 2.41 mg/dL — ABNORMAL HIGH (ref 0.44–1.00)
GFR calc Af Amer: 21 mL/min — ABNORMAL LOW (ref >60)
GFR calc non Af Amer: 18 mL/min — ABNORMAL LOW (ref >60)
Glucose, Bld: 231 mg/dL — ABNORMAL HIGH (ref 65–99)
Potassium: 4.4 mmol/L (ref 3.5–5.1)
Sodium: 138 mmol/L (ref 135–145)

## 2015-03-25 LAB — CREATININE, SERUM
Creatinine, Ser: 2.4 mg/dL — ABNORMAL HIGH (ref 0.44–1.00)
GFR calc Af Amer: 21 mL/min — ABNORMAL LOW (ref >60)
GFR calc non Af Amer: 19 mL/min — ABNORMAL LOW (ref >60)

## 2015-03-25 LAB — GLUCOSE, CAPILLARY
Glucose-Capillary: 231 mg/dL — ABNORMAL HIGH (ref 65–99)
Glucose-Capillary: 263 mg/dL — ABNORMAL HIGH (ref 65–99)
Glucose-Capillary: 242 mg/dL — ABNORMAL HIGH (ref 65–99)

## 2015-03-25 LAB — LIPASE, BLOOD: Lipase: 21 U/L (ref 11–51)

## 2015-03-25 LAB — AMYLASE: Amylase: 44 U/L (ref 28–100)

## 2015-03-25 LAB — HEMOGLOBIN A1C
Hgb A1c MFr Bld: 8.6 % — ABNORMAL HIGH (ref 4.8–5.6)
Mean Plasma Glucose: 200 mg/dL

## 2015-03-25 MED ORDER — OFF THE BEAT BOOK
Freq: Once | Status: AC
  Administered 2015-03-25: 10:00:00
  Filled 2015-03-25: qty 1

## 2015-03-25 MED ORDER — LIVING WELL WITH DIABETES BOOK
Freq: Once | Status: AC
  Administered 2015-03-25: 10:00:00
  Filled 2015-03-25: qty 1

## 2015-03-25 NOTE — Progress Notes (Signed)
Patient refuses to take her morning medications at this time. States she will take them when she gets home because her "insurance will not pay for them here."

## 2015-03-25 NOTE — Care Management Note (Signed)
Case Management Note  Patient Details  Name: Tara Jackson MRN: [id number] Date of Birth: Sep 28, 1940  Subjective/Objective:   Date:03/25/15 Spoke with patient at the bedside.  Introduced self as Tourist information centre manager and explained role in discharge planning and how to be reached.  Verified patient lives in town, with spouse. Expressed potential need for no  DME.  Verified patient anticipates to go home with family, at time of discharge . Patient denied needing help with their medication.  Patient is driven by spouse to MD appointments.  Verified patient has PCP Shon Baton. NCM received consult for CHF screen, patient chose [facility] for [facility] for Disease Management of CHF, referral made to [facility] with [facility]  Patient states she has a scale at home.  She is for dc to home today.  Soc will begin 24-48 hrs post dc.   Plan: CM will continue to follow for discharge planning and [facility] resources.                  Action/Plan:   Expected Discharge Date:                  Expected Discharge Plan:  [place]  In-House Referral:     Discharge planning Services  CM Consult  Post Acute Care Choice:  Home Health Choice offered to:  Patient  DME Arranged:    DME Agency:     HH Arranged:  RN, Disease Management [place] Agency:  [place]  Status of Service:  Completed, signed off  Medicare Important Message Given:    Date Medicare IM Given:    Medicare IM give by:    Date Additional Medicare IM Given:    Additional Medicare Important Message give by:     If discussed at [place] of Stay Meetings, dates discussed:    Additional Comments:  Zenon Mayo, RN 03/25/2015, 11:18 AM

## 2015-03-26 ENCOUNTER — Telehealth: Payer: Self-pay | Admitting: Physician Assistant

## 2015-03-26 ENCOUNTER — Encounter (HOSPITAL_COMMUNITY): Payer: Self-pay | Admitting: Physician Assistant

## 2015-03-26 MED ORDER — TRAMADOL HCL 50 MG PO TABS: 50.0000 mg | ORAL_TABLET | Freq: Two times a day (BID) | ORAL | Status: DC | PRN

## 2015-03-26 NOTE — Telephone Encounter (Signed)
Paged by answering service. 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 Today the patient having sharp pain at incision site. Intermittent. She patient took Tramadol x 1 and Tylenol x 3 with minimal relief. Also has mild swelling, redness and bruise. Denies fever or chills. No SOB, palpitations or dizziness.  She does not want to take Ibuprofen as she taken ASA and plavix. Rx given for Tramdol 50mg  q12 hours. Advised to go to Clinic in morning if no improvement overnight. If worsen pain, redness or swelling ---> go to ER for further evaluation. The patient and husband voice understanding.   Malachi Kinzler, [place]

## 2015-03-27 ENCOUNTER — Telehealth: Payer: Self-pay | Admitting: Internal Medicine

## 2015-03-27 ENCOUNTER — Encounter: Payer: Self-pay | Admitting: Internal Medicine

## 2015-03-27 DIAGNOSIS — G4730 Sleep apnea, unspecified: Secondary | ICD-10-CM | POA: Diagnosis not present

## 2015-03-27 DIAGNOSIS — D649 Anemia, unspecified: Secondary | ICD-10-CM | POA: Diagnosis not present

## 2015-03-27 DIAGNOSIS — E785 Hyperlipidemia, unspecified: Secondary | ICD-10-CM | POA: Diagnosis not present

## 2015-03-27 DIAGNOSIS — M1990 Unspecified osteoarthritis, unspecified site: Secondary | ICD-10-CM | POA: Diagnosis not present

## 2015-03-27 DIAGNOSIS — Z950 Presence of cardiac pacemaker: Secondary | ICD-10-CM | POA: Diagnosis not present

## 2015-03-27 DIAGNOSIS — J45909 Unspecified asthma, uncomplicated: Secondary | ICD-10-CM | POA: Diagnosis not present

## 2015-03-27 DIAGNOSIS — E1140 Type 2 diabetes mellitus with diabetic neuropathy, unspecified: Secondary | ICD-10-CM | POA: Diagnosis not present

## 2015-03-27 DIAGNOSIS — I10 Essential (primary) hypertension: Secondary | ICD-10-CM | POA: Diagnosis not present

## 2015-03-27 DIAGNOSIS — R69 Illness, unspecified: Secondary | ICD-10-CM | POA: Diagnosis not present

## 2015-03-27 DIAGNOSIS — Z48812 Encounter for surgical aftercare following surgery on the circulatory system: Secondary | ICD-10-CM | POA: Diagnosis not present

## 2015-03-27 MED ORDER — TRAMADOL HCL 50 MG PO TABS: 50.0000 mg | ORAL_TABLET | Freq: Two times a day (BID) | ORAL | Status: DC | PRN

## 2015-03-27 MED FILL — Medication: Qty: 1 | Status: AC

## 2015-03-27 NOTE — Telephone Encounter (Signed)
This encounter was created in error - please disregard.

## 2015-03-27 NOTE — Telephone Encounter (Signed)
New message      Talk to someone in device clinic.  Pt had pacemaker put in recently.  Someone was supposed to call in a presc for tramadol to [facility] [place] because pt is in pain.  [facility] has not received the presc.  Please call the home health nurse.  She is at the patient's home now.

## 2015-03-27 NOTE — Telephone Encounter (Signed)
Pt says she still have not received her medicine. Pt she thought it was supposed to be called in last night.Please call the medicine to [phone].

## 2015-03-27 NOTE — Telephone Encounter (Signed)
Spoke with the [facility] and let her know the medication was e-scribed last night but that our office is not set up to send in controlled substances via e-scribe.  I have called this in for her and it should be ready soon for pick up.  Very appreciative of the call back

## 2015-04-02 ENCOUNTER — Ambulatory Visit: Payer: Medicare HMO

## 2015-04-02 DIAGNOSIS — N183 Chronic kidney disease, stage 3 (moderate): Secondary | ICD-10-CM | POA: Diagnosis not present

## 2015-04-02 DIAGNOSIS — I129 Hypertensive chronic kidney disease with stage 1 through stage 4 chronic kidney disease, or unspecified chronic kidney disease: Secondary | ICD-10-CM | POA: Diagnosis not present

## 2015-04-03 DIAGNOSIS — E785 Hyperlipidemia, unspecified: Secondary | ICD-10-CM | POA: Diagnosis not present

## 2015-04-03 DIAGNOSIS — Z950 Presence of cardiac pacemaker: Secondary | ICD-10-CM | POA: Diagnosis not present

## 2015-04-03 DIAGNOSIS — E1140 Type 2 diabetes mellitus with diabetic neuropathy, unspecified: Secondary | ICD-10-CM | POA: Diagnosis not present

## 2015-04-03 DIAGNOSIS — G4730 Sleep apnea, unspecified: Secondary | ICD-10-CM | POA: Diagnosis not present

## 2015-04-03 DIAGNOSIS — R69 Illness, unspecified: Secondary | ICD-10-CM | POA: Diagnosis not present

## 2015-04-03 DIAGNOSIS — D649 Anemia, unspecified: Secondary | ICD-10-CM | POA: Diagnosis not present

## 2015-04-03 DIAGNOSIS — J45909 Unspecified asthma, uncomplicated: Secondary | ICD-10-CM | POA: Diagnosis not present

## 2015-04-03 DIAGNOSIS — I10 Essential (primary) hypertension: Secondary | ICD-10-CM | POA: Diagnosis not present

## 2015-04-03 DIAGNOSIS — Z48812 Encounter for surgical aftercare following surgery on the circulatory system: Secondary | ICD-10-CM | POA: Diagnosis not present

## 2015-04-03 DIAGNOSIS — M1990 Unspecified osteoarthritis, unspecified site: Secondary | ICD-10-CM | POA: Diagnosis not present

## 2015-04-06 ENCOUNTER — Other Ambulatory Visit: Payer: Medicare HMO

## 2015-04-06 ENCOUNTER — Ambulatory Visit: Payer: Medicare HMO

## 2015-04-07 ENCOUNTER — Ambulatory Visit (INDEPENDENT_AMBULATORY_CARE_PROVIDER_SITE_OTHER): Payer: Medicare HMO | Admitting: Neurology

## 2015-04-07 ENCOUNTER — Encounter: Payer: Self-pay | Admitting: Internal Medicine

## 2015-04-07 ENCOUNTER — Encounter: Payer: Self-pay | Admitting: Neurology

## 2015-04-07 VITALS — BP 103/61 | HR 61 | Ht 67.0 in | Wt 290.0 lb

## 2015-04-07 DIAGNOSIS — R51 Headache: Secondary | ICD-10-CM | POA: Diagnosis not present

## 2015-04-07 DIAGNOSIS — R519 Headache, unspecified: Secondary | ICD-10-CM | POA: Insufficient documentation

## 2015-04-07 MED ORDER — NORTRIPTYLINE HCL 50 MG PO CAPS: 100.0000 mg | ORAL_CAPSULE | Freq: Every day | ORAL | Status: DC

## 2015-04-07 NOTE — Patient Instructions (Signed)
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 Increase nortriptyline dose to 100 mg at night to help with headache prophylaxis and sleep. She was advised to take fish oil and increase participation in cognitively challenging activities like solving crossword puzzles, sudoku to help with the mild cognitive impairment. Continue Plavix for stroke prevention with strict control of diabetes with hemoglobin A1c goal below 6.5, lipids with LDL cholesterol goal below 70 mg percent and blood pressure goal below 130/90. I also advised her to ambulate with a cane or walker and to follow safety precautions. She will return for follow-up in 3 months with Ward Givens, nurse practitioner call earlier if necessary.

## 2015-04-07 NOTE — Progress Notes (Signed)
Tara Jackson: Tara Jackson DOB: 05-04-1940  REASON FOR VISIT: follow up- headache, TIA, memory loss HISTORY FROM: Tara Jackson  HISTORY OF PRESENT ILLNESS: Tara Jackson is a 75 year old female with a history of headaches, TIA and memory loss. She returns today for follow-up. She reports that today she has a headache. She states that it started approximately 1 month ago and has been daily in nature. She rates her pain a 10 out of 10 today. She describes her discomfort as pulsatile in the temporal region. She does describe a stiff neck. Denies nausea and vomiting. Does have some photophobia. Denies phonophobia. She has tried tramadol 100 mg with no relief. In the past the Tara Jackson has been on tizanidine (2014) and according to the notes she received good benefit from this medication. The Tara Jackson does not recall this medication and is unsure why she is no longer taking it-- other than her prescription may have ran out. The Tara Jackson does feel that her memory is worse. She states that last night she did not remember eating supper. The Tara Jackson is able to complete most ADLs independently. Although she requires assistance with putting on her shoes and stockings. Her husband also picks out her clothes for her. She no longer does much cooking because she feels dizzy at times. She does not operate a motor vehicle. She continues to do the finances however last month she had some complications however she is going to try again this month. The Tara Jackson is not currently on any medication for her memory. Her BuSpar was increased at the last visit however the Tara Jackson is not aware of this. Her husband is with her today at the visit. 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  HISTORY: Tara Jackson is a 75 year old African lady who developed new-onset chronic daily headaches for the last 4 weeks. She states that she had some dental extractions done of her upper molar teeth about a week ago following  which she noticed worsening of her pain which predominantly is in the temporal head regions but does spread all over. She states the pain is constant, severe. At times it is pulsatile. She was seen in the emergency room at [facility] on 07/23/12. She was treated with IV Dilaudid, Phenergan, Zofran and Benadryl in no hopping on the subtotal benefit. CT scan of the maxillary region showed no evidence of abscess associated with a total extraction but showed postoperative changes with communication between the oral cavity and nasal maxillary sinus. There was evidence of sinusitis seen. CT scan of the head showed no acute abnormality. The headache is accompanied by some nausea and light sensitivity and occasional vomiting. The headache remains unchanged throughout the day. She is currently being treated with antibiotic course for a swollen gums. She has tried oxycodone, Ultram and Tylenol which have not helped. She does also have history of degenerative cervical spine disease she does complain of neck pain and muscle tightness and spasm as well. She has had 4 minor falls since her last visit 2 months ago. She even had a brief episode of blacking out yesterday but her husband caught her. She found that her blood pressure was low and her primary care physician has reduced her blood pressure medication since then. She denies any prior history of migraine headaches of similar headaches in the past. She denies any blurred vision or loss of vision accompanying her headaches. 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   UPDATE 11/15/12 (LL): Tara Jackson comes to office for revisit for daily headache after wisdom tooth extraction. MRI cervical spine shows prominent spondylitic changes mostly at C5-6 and C4-5. with lateral disc osteophyte protrusions resulting in moderate foraminal narrowing on the right and mild canal  stenosis without definite compression. MRI brain showed mild changes of microvascular ischemia. Advanced changes of chronic paranasal sinusitis. She states that her headaches are much better, about 84 % better than last visit. She does not have a headache today. She is using Tizanadine 4 mg for neck muscle tension. She had a recent sleep study which showed snoring but AHI within normal limits. Restless legs syndrome was suggested due to frequent leg movements. She states at night sometimes she feels like something is crawling on her lower legs and she feels like she must move them.   UPDATE 09/19/13 (LL): Since last visit Tara Jackson states that she has had some problems with abdominal pain and constipation. She recently went to the ER with abdominal pain and constipation times one week, despite the use of several enemas. Her headaches have not been bad in the last few months. She had a epidural pain injection in May at pain management. She denies any focal TIA or stroke symptoms, but has numerous symptom complaints on review of systems. She is concerned about leg swelling and abdominal swelling, although her weight is up another 21 pounds since last visit. She states she has been having frequent falls without injury, but when she falls it's difficult for her to get up by herself and even difficult for her husband to get her up. She does not exercise and uses a motorized wheelchair his most of the time. Of particular concern today is an increase in hand tremor right greater than left, which is present at rest but more intense with action. It is causing her embarrassment. Update 06/03/2014 : She returns for follow-up after last visit 6 months ago. She is accompanied by husband. Tara Jackson reports new complaint of memory difficulties for the last 3 months. She states that this is mainly short-term memory. She cannot remember recent events and conversations and needs to be reminded multiple times. She often asked the same  questions when she was told to answer a short while back. At times she feels that she has trouble completing sentences and can stop in mid sentence and search for what she was trying to say. She has had some episodes of hypoglycemia but feels her memory difficulties and are not related to that. She was prescribed primidone at last visit for tremor but she did not fill the prescription but she remains on Inderal 40 mg which seems to help her tremor quite well. She was hospitalized about 4-6 weeks ago with chest pain and all workup was negative except heart rate was slow. She has long-standing history of depression and does take BuSpar 7.5 mg daily as well as Klonopin but feels her depression is not adequately treated. She has had no recurrent stroke or TIA symptoms. She remains on clopidogrel which is tolerating well without significant bleeding or bruising. She states her blood pressure and cholesterol control have been good Update 04/06/2015 : She returns for follow-up after last visit 6 months ago. She is accompanied by her husband. Tara Jackson states that in memory diminished slightly worse and she has trouble remembering some recent information. However she can  still manage to take care of her daily activities by herself. She was recently hospitalized 2 weeks ago and had a pacemaker implanted by Dr. Lovena Le. She continues to have daily headaches but off and on and not constant. The headaches vary in severity from 6/10-10/10. She takes Tylenol which seems to help. On average needs to 3 tablets a day at least. She needs to take tramadol occasionally which doesn't work as well. She has been taking Pamelor 50 mg capsule at night for her remote history of anxiety attacks. She states her blood pressure is well controlled and today it is low at 103/61. She is having trouble controlling her sugars which have fluctuated a bit she has not had any recurrent stroke or TIA symptoms. She remains on Plavix which is tolerating well  without bleeding or bruising. REVIEW OF SYSTEMS: Out of a complete 14 system review of symptoms, the Tara Jackson complains only of the following symptoms, and all other reviewed systems are negative. Fatigue, fever, chills, facial swelling, double vision, shortness of breath, wheezing, chest tightness, palpitations, chest pain, obstipation, nausea, vomiting, rectal pain, restless legs, sleep talking, joint pain and swelling, back pain, muscle cramps, walking difficulty, skin moles and itching, for allergies, swollen lymph nodes, anemia, memory loss, headache, confusion, depression and all other systems negative ALLERGIES: Allergies  Allergen Reactions  . Iodine Other (See Comments)    ONLY IV--Makes unconscious  . Metoclopramide Hcl Other (See Comments)    suicidal  . Sulfa Antibiotics Hives  . Iohexol      Code: SOB, Desc: cardiac arrest w/ iv contrast, has never used 13 hr prep//alice calhoun, Onset Date: [id number]   . Lactose Intolerance (Gi) Diarrhea  . Lansoprazole Nausea And Vomiting    HOME MEDICATIONS: Outpatient Prescriptions Prior to Visit  Medication Sig Dispense Refill  . albuterol (PROVENTIL HFA;VENTOLIN HFA) 108 (90 BASE) MCG/ACT inhaler Inhale 2 puffs into the lungs every 6 (six) hours as needed for wheezing.    . busPIRone (BUSPAR) 10 MG tablet Take 1 tablet (10 mg total) by mouth every morning. 60 tablet 1  . Cholecalciferol (VITAMIN D-3 PO) Take 1 tablet by mouth every morning.     . clonazePAM (KLONOPIN) 0.5 MG tablet 0.5-1 mg 2 (two) times daily. Reported on 03/24/2015    . clopidogrel (PLAVIX) 75 MG tablet Take 75 mg by mouth every morning.    . diclofenac sodium (VOLTAREN) 1 % GEL Apply 2 g topically 3 (three) times daily as needed (to affected area). 1 Tube 0  . dicyclomine (BENTYL) 10 MG capsule Take 10 mg by mouth 3 (three) times daily as needed for spasms.     Marland Kitchen esomeprazole (NEXIUM) 40 MG capsule Take 40 mg by mouth daily before breakfast.    . furosemide (LASIX) 40  MG tablet Take 40 mg by mouth daily.     Marland Kitchen glipiZIDE (GLUCOTROL) 10 MG tablet Take 20 mg by mouth 2 (two) times daily before a meal.     . insulin NPH (HUMULIN N,NOVOLIN N) 100 UNIT/ML injection Inject 30 Units into the skin daily before breakfast. Novolin-N    . insulin regular (NOVOLIN R,HUMULIN R) 100 units/mL injection Inject 0.05 mLs (5 Units total) into the skin 3 (three) times daily before meals. (Tara Jackson taking differently: Inject 20 Units into the skin 3 (three) times daily before meals. )    . levothyroxine (SYNTHROID, LEVOTHROID) 100 MCG tablet Take 100 mcg by mouth every morning.     . lidocaine (LIDODERM) 5 % Place  3 patches onto the skin daily. Remove & Discard patch within 12 hours or as directed by MD    . Linaclotide (LINZESS) 290 MCG CAPS capsule Take 290 mcg by mouth every morning.     Marland Kitchen LORazepam (ATIVAN) 0.5 MG tablet TK 1 T PO AS NEEDED FOR ANXIETY ATTACK  0  . meclizine (ANTIVERT) 12.5 MG tablet Take 12.5 mg by mouth 3 (three) times daily as needed for dizziness.     . polyethylene glycol (MIRALAX / GLYCOLAX) packet Take 17 g by mouth daily as needed for mild constipation.    . potassium chloride (MICRO-K) 10 MEQ CR capsule Take 10 mEq by mouth daily as needed (leg cramps).     . rosuvastatin (CRESTOR) 20 MG tablet Take 20 mg by mouth every morning.     . Sennosides (EX-LAX PO) Take 1 tablet by mouth daily as needed (constipation).     . sucralfate (CARAFATE) 1 G tablet Take 1-2 g by mouth daily.     Marland Kitchen terconazole (TERAZOL 7) 0.4 % vaginal cream Place 1 applicator vaginally at bedtime.     . traMADol (ULTRAM) 50 MG tablet Take 1 tablet (50 mg total) by mouth every 12 (twelve) hours as needed for moderate pain. 30 tablet 0  . valsartan (DIOVAN) 160 MG tablet Take 1 tablet (160 mg total) by mouth daily. 30 tablet 11  . nortriptyline (PAMELOR) 50 MG capsule Take 50 mg by mouth at bedtime.     No facility-administered medications prior to visit.    PAST MEDICAL HISTORY: Past  Medical History  Diagnosis Date  . DVT (deep venous thrombosis) ([place])     "18 in RLE; 7 LLE prior to PE" (03/24/2015)  . Asthma   . Hypothyroidism   . Anxiety   . Depression   . H/O hiatal hernia   . Arthritis   . Anemia   . PE (pulmonary thromboembolism) ([place]) x 3, last one was ~ 2003    history of recurrent RLE DVT with PE - last PE ~>13 yrs; Maintained on Plavix  . Hyperlipidemia   . Diabetes mellitus type 2, insulin dependent ([place])   . Hypertension associated with diabetes ([place])   . Glaucoma   . Cataract   . Diabetic neuropathy ([place])   . Palpitations 35years ago    Cardionet monitor - revealed mostly normal sinus rhythm, sinus bradycardia with first-degree A-V block heart rates mostly in the 50s and 60s with some 70s. No arrhythmias, PVCs or PACs noted.  Marland Kitchen TIA (transient ischemic attack) March 2014  . Diastolic dysfunction, left ventricle 11/30/14    EF 55%, grade 1 DD  . Stroke [facility]) 04-19-2013    tia and stroke. Weakness Rt hand  . Sleep apnea     cpap disontinued; test done 07/14/2008 ordered per  [place]  . Restless legs syndrome   . Spinal stenosis     L 3, L 4 and L 5, and C 1, C 2 and C3  . Complication of anesthesia     hard to wake up after anesthesia, trouble turning head  . Spinal headache   . Memory loss 06/03/2014  . Osteoporosis   . Normal cardiac stress test 11/30/14    Negative Myoview  . Presence of permanent cardiac pacemaker   . Kidney stones     "passed them"  . On home oxygen therapy     "2L oxygen concentrator @ night"  . Symptomatic bradycardia 03/24/15    [employer] PPM, Dr.  Lovena Le  . Brittle bone disease     PAST SURGICAL HISTORY: Past Surgical History  Procedure Laterality Date  . Abdominal hysterectomy    . Cholecystectomy    . Back surgery      steroid inj  . Tonsillectomy    . Appendectomy    . Esophagogastroduodenoscopy  08/09/2011    Procedure: ESOPHAGOGASTRODUODENOSCOPY (EGD);  Surgeon: Jeryl Columbia, MD;  Location: [facility];  Service: Endoscopy;  Laterality: N/A;  . Hot hemostasis  08/09/2011    Procedure: HOT HEMOSTASIS (ARGON PLASMA COAGULATION/BICAP);  Surgeon: Jeryl Columbia, MD;  Location: [facility];  Service: Endoscopy;  Laterality: N/A;  . Trigger finger release  11/22/2011    Procedure: RELEASE TRIGGER FINGER/A-1 PULLEY;  Surgeon: Meredith Pel, MD;  Location: [facility];  Service: Orthopedics;  Laterality: Left;  Left trigger thumb release  . Cardiac catheterization  02/14/2012    no evidence of obstructive coronary disease ,low EDP with normal EF  . Doppler echocardiography  04/19/2012    EF 0000000; normal diastolic pressures, no regional wall motion motion abnormalities  . Nm myocar perf wall motion  01/26/2012    EF 79%,LV normal,ST SEGMENT CHANGE SUGGESTIVE OF ISCHEMIA.  Eusebio Me doppler  05/29/2012    RIGHT EXTREM. NORMAL VENOUS DUPLEX  . Joint replacement    . Knee surgery Left done with 2nd left knee replacement    spacer bar placement  . Cataract extraction w/ intraocular lens  implant, bilateral Bilateral 2000s  . Trigger finger release Right yrs ago  . Esophagogastroduodenoscopy (egd) with propofol N/A 11/12/2013    Procedure: ESOPHAGOGASTRODUODENOSCOPY (EGD) WITH PROPOFOL;  Surgeon: Jeryl Columbia, MD;  Location: [facility] ENDOSCOPY;  Service: Endoscopy;  Laterality: N/A;  . Hot hemostasis N/A 11/12/2013    Procedure: HOT HEMOSTASIS (ARGON PLASMA COAGULATION/BICAP);  Surgeon: Jeryl Columbia, MD;  Location: [facility];  Service: Endoscopy;  Laterality: N/A;  . Left heart catheterization with coronary angiogram N/A 02/14/2012    Procedure: LEFT HEART CATHETERIZATION WITH CORONARY ANGIOGRAM;  Surgeon: Leonie Man, MD;  Location: [facility] CATH LAB;  Service: Cardiovascular;  Laterality: N/A;  . Insert / replace / remove pacemaker  03/24/2015  . Total knee arthroplasty Bilateral   . Revision total knee arthroplasty Left   . Glaucoma surgery Bilateral 2000s    "laser"  . Ep implantable device N/A  03/24/2015    [employer] PPM, Dr. Lovena Le    FAMILY HISTORY: Family History  Problem Relation Age of Onset  . Cancer Mother   . Cancer - Prostate Father     SOCIAL HISTORY: Social History   Social History  . Marital Status: Married    Spouse Name: [name]  . Number of Children: 2  . Years of Education: college   Occupational History  . retired    Social History Main Topics  . Smoking status: Never Smoker   . Smokeless tobacco: Never Used  . Alcohol Use: No  . Drug Use: No  . Sexual Activity: Yes   Other Topics Concern  . Not on file   Social History Narrative   Married [name].) married 30 years.   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          PHYSICAL EXAM  Filed Vitals:   04/07/15 1043  BP: 103/61  Pulse: 61  Height: 5\' 7"  (1.702 m)  Weight: 290 lb (131.543 kg)   Body mass index is 45.41 kg/(m^2).  MMSE - Mini Mental State Exam 10/07/2014  Orientation to time 5  Orientation to Place 5  Registration 3  Attention/ Calculation 5  Recall 3  Language- name 2 objects 2  Language- repeat 1  Language- follow 3 step command 2  Language- read & follow direction 1  Write a sentence 1  Copy design 1  Total score 29       Generalized: Well developed, in no acute distress .Left infraclavicular dressing at pacemaker insertion site Neurological examination  Mentation: Alert oriented to time, place, history taking. Follows all commands speech and language fluent. MMSE 29/30With one deficit in recall. Clock drawing 4/4. Animal naming test 8. Cranial nerve II-XII: Pupils were equal round reactive to light. Extraocular movements were full, visual field were full on confrontational test. Facial sensation and strength were normal. Uvula tongue midline. Head turning and shoulder shrug  were normal and symmetric. Motor: The motor testing reveals 5 over 5 strength in all extremities except 4/5 strength in the right upper  extremity. Good symmetric motor tone is noted throughout.  Sensory: Sensory testing is intact to soft touch on all 4 extremities. No evidence of extinction is noted.  Coordination: Cerebellar testing reveals good finger-nose-finger and heel-to-shin bilaterally.  Gait and station: Gait is normal. Tandem gait is normal. Romberg is negative. No drift is seen.  Reflexes: Deep tendon reflexes are symmetric and normal bilaterally.   DIAGNOSTIC DATA (LABS, IMAGING, TESTING) - I reviewed Tara Jackson records, labs, notes, testing and imaging myself where available.  Lab Results  Component Value Date   WBC 8.2 03/24/2015   HGB 11.0* 03/24/2015   HCT 34.1* 03/24/2015   MCV 94.5 03/24/2015   PLT 255 03/24/2015      Component Value Date/Time   NA 138 03/25/2015 0915   K 4.4 03/25/2015 0915   CL 103 03/25/2015 0915   CO2 24 03/25/2015 0915   GLUCOSE 231* 03/25/2015 0915   BUN 31* 03/25/2015 0915   CREATININE 2.41* 03/25/2015 0915   CALCIUM 8.8* 03/25/2015 0915   PROT 6.9 03/24/2015 2334   ALBUMIN 3.4* 03/24/2015 2334   AST 29 03/24/2015 2334   ALT 15 03/24/2015 2334   ALKPHOS 58 03/24/2015 2334   BILITOT 0.6 03/24/2015 2334   GFRNONAA 18* 03/25/2015 0915   GFRAA 21* 03/25/2015 0915     ASSESSMENT AND PLAN 75 y.o. year old female  has a past medical history of DVT (deep venous thrombosis) ([place]); Asthma; Hypothyroidism; Anxiety; Depression; H/O hiatal hernia; Arthritis; Anemia; PE (pulmonary thromboembolism) ([facility]) (x 3, last one was ~ 2003); Hyperlipidemia; Diabetes mellitus type 2, insulin dependent ([place]); Hypertension associated with diabetes ([place]); Glaucoma; Cataract; Diabetic neuropathy ([place]); Palpitations (35years ago); TIA (transient ischemic attack) (March 2014); Diastolic dysfunction, left ventricle (11/30/14); Stroke [facility]) (04-19-2013); Sleep apnea; Restless legs syndrome; Spinal stenosis; Complication of anesthesia; Spinal headache; Memory loss (06/03/2014); Osteoporosis; Normal cardiac  stress test (11/30/14); Presence of permanent cardiac pacemaker; Kidney stones; On home oxygen therapy; Symptomatic bradycardia (03/24/15); and Brittle bone disease. here with:  1. Chronic Daily Headache with analgesic rebound 2. Mild cognitive impairment 3. History of TIA   I had a long discussion with the Tara Jackson and husband regarding her chronic daily headaches which appear to be analgesic rebound and recommend she cut back to Tylenol and avoid taking them more than twice a week or so. Increase nortriptyline dose to 100 mg at night to help with headache prophylaxis and sleep. She was advised to take fish oil and increase participation in  cognitively challenging activities like solving crossword puzzles, sudoku to help with the mild cognitive impairment. Continue Plavix for stroke prevention with strict control of diabetes with hemoglobin A1c goal below 6.5, lipids with LDL cholesterol goal below 70 mg percent and blood pressure goal below 130/90. I also advised her to ambulate with a cane or walker and to follow safety precautions. She will return for follow-up in 3 months with Ward Givens, nurse practitioner call earlier if necessary.  Antony Contras, MD  04/07/2015, 12:10 PM [facility] [address] [place], [place] [address] [phone]

## 2015-04-08 DIAGNOSIS — E1140 Type 2 diabetes mellitus with diabetic neuropathy, unspecified: Secondary | ICD-10-CM | POA: Diagnosis not present

## 2015-04-08 DIAGNOSIS — Z48812 Encounter for surgical aftercare following surgery on the circulatory system: Secondary | ICD-10-CM | POA: Diagnosis not present

## 2015-04-08 DIAGNOSIS — D649 Anemia, unspecified: Secondary | ICD-10-CM | POA: Diagnosis not present

## 2015-04-08 DIAGNOSIS — J45909 Unspecified asthma, uncomplicated: Secondary | ICD-10-CM | POA: Diagnosis not present

## 2015-04-08 DIAGNOSIS — G4730 Sleep apnea, unspecified: Secondary | ICD-10-CM | POA: Diagnosis not present

## 2015-04-08 DIAGNOSIS — Z950 Presence of cardiac pacemaker: Secondary | ICD-10-CM | POA: Diagnosis not present

## 2015-04-08 DIAGNOSIS — R69 Illness, unspecified: Secondary | ICD-10-CM | POA: Diagnosis not present

## 2015-04-08 DIAGNOSIS — E785 Hyperlipidemia, unspecified: Secondary | ICD-10-CM | POA: Diagnosis not present

## 2015-04-08 DIAGNOSIS — I10 Essential (primary) hypertension: Secondary | ICD-10-CM | POA: Diagnosis not present

## 2015-04-08 DIAGNOSIS — M1990 Unspecified osteoarthritis, unspecified site: Secondary | ICD-10-CM | POA: Diagnosis not present

## 2015-04-09 ENCOUNTER — Encounter: Payer: Self-pay | Admitting: Internal Medicine

## 2015-04-09 ENCOUNTER — Other Ambulatory Visit (INDEPENDENT_AMBULATORY_CARE_PROVIDER_SITE_OTHER): Payer: Medicare HMO | Admitting: *Deleted

## 2015-04-09 ENCOUNTER — Ambulatory Visit (INDEPENDENT_AMBULATORY_CARE_PROVIDER_SITE_OTHER): Payer: Medicare HMO | Admitting: *Deleted

## 2015-04-09 DIAGNOSIS — I495 Sick sinus syndrome: Secondary | ICD-10-CM

## 2015-04-09 DIAGNOSIS — N189 Chronic kidney disease, unspecified: Secondary | ICD-10-CM

## 2015-04-09 DIAGNOSIS — Z950 Presence of cardiac pacemaker: Secondary | ICD-10-CM

## 2015-04-09 LAB — CUP PACEART INCLINIC DEVICE CHECK
Brady Statistic RA Percent Paced: 43 %
Brady Statistic RV Percent Paced: 29 %
Date Time Interrogation Session: 20170302050000
Implantable Lead Implant Date: 20170214
Implantable Lead Implant Date: 20170214
Implantable Lead Location: 753859
Implantable Lead Location: 753860
Implantable Lead Model: 7740
Implantable Lead Model: 7741
Implantable Lead Serial Number: 649859
Implantable Lead Serial Number: 728741
Lead Channel Impedance Value: 781 Ohm
Lead Channel Impedance Value: 807 Ohm
Lead Channel Pacing Threshold Amplitude: 0.5 V
Lead Channel Pacing Threshold Amplitude: 1.3 V
Lead Channel Pacing Threshold Pulse Width: 0.4 ms
Lead Channel Pacing Threshold Pulse Width: 0.4 ms
Lead Channel Sensing Intrinsic Amplitude: 25 mV
Lead Channel Sensing Intrinsic Amplitude: 3.8 mV
Lead Channel Setting Pacing Amplitude: 1.8 V
Lead Channel Setting Pacing Amplitude: 2 V
Lead Channel Setting Pacing Pulse Width: 0.4 ms
Lead Channel Setting Sensing Sensitivity: 2.5 mV
Pulse Gen Serial Number: 718098

## 2015-04-09 LAB — BASIC METABOLIC PANEL
BUN: 18 mg/dL (ref 7–25)
CO2: 27 mmol/L (ref 20–31)
Calcium: 9.7 mg/dL (ref 8.6–10.4)
Chloride: 100 mmol/L (ref 98–110)
Creat: 1.64 mg/dL — ABNORMAL HIGH (ref 0.60–0.93)
Glucose, Bld: 294 mg/dL — ABNORMAL HIGH (ref 65–99)
Potassium: 4.9 mmol/L (ref 3.5–5.3)
Sodium: 137 mmol/L (ref 135–146)

## 2015-04-09 NOTE — Progress Notes (Signed)
Wound check appointment. Steri-strips removed. Wound without redness or edema. Incision edges approximated, wound well healed. Normal device function. RA thresholds, sensing, and impedances consistent with implant measurements. RV sensing consistent, impedance decreased from 1100ohms at implant to 807ohms today, RV threshold increased from 0.7V@0 .65ms at implant to 1.3V@0 .24ms today. Reviewed with GT, will continue to monitor. Auto capture programmed on for extra safety margin until 3 month visit. Histogram distribution appropriate for patient and level of activity. No mode switches or high ventricular rates noted. Patient educated about wound care, arm mobility, lifting restrictions. ROV with GT on 06/24/15.

## 2015-04-09 NOTE — Addendum Note (Signed)
Addended by: Eulis Foster on: 04/09/2015 02:15 PM   Modules accepted: Orders

## 2015-04-13 ENCOUNTER — Telehealth: Payer: Self-pay | Admitting: *Deleted

## 2015-04-13 NOTE — Telephone Encounter (Signed)
-----   Message from [place], [place] sent at 04/10/2015  1:33 PM EST ----- Please let the patient know her kidney function is improved, appears similar to last year, her blood sugar is elevated and importance of he DM control, and close follow up with her PMD regarding her DM management.  Thanks [employer]

## 2015-04-21 ENCOUNTER — Encounter: Payer: Self-pay | Admitting: Cardiology

## 2015-04-21 ENCOUNTER — Ambulatory Visit: Payer: Medicare HMO | Admitting: Cardiology

## 2015-04-21 ENCOUNTER — Ambulatory Visit (INDEPENDENT_AMBULATORY_CARE_PROVIDER_SITE_OTHER): Payer: Medicare HMO | Admitting: Cardiology

## 2015-04-21 VITALS — BP 130/70 | HR 64 | Ht 67.0 in | Wt 291.4 lb

## 2015-04-21 DIAGNOSIS — I5032 Chronic diastolic (congestive) heart failure: Secondary | ICD-10-CM | POA: Diagnosis not present

## 2015-04-21 DIAGNOSIS — R001 Bradycardia, unspecified: Secondary | ICD-10-CM | POA: Diagnosis not present

## 2015-04-21 DIAGNOSIS — I10 Essential (primary) hypertension: Secondary | ICD-10-CM

## 2015-04-21 DIAGNOSIS — R609 Edema, unspecified: Secondary | ICD-10-CM

## 2015-04-21 DIAGNOSIS — I5041 Acute combined systolic (congestive) and diastolic (congestive) heart failure: Secondary | ICD-10-CM | POA: Diagnosis not present

## 2015-04-21 DIAGNOSIS — Z950 Presence of cardiac pacemaker: Secondary | ICD-10-CM

## 2015-04-21 DIAGNOSIS — M25473 Effusion, unspecified ankle: Secondary | ICD-10-CM

## 2015-04-21 DIAGNOSIS — I495 Sick sinus syndrome: Secondary | ICD-10-CM

## 2015-04-21 DIAGNOSIS — E785 Hyperlipidemia, unspecified: Secondary | ICD-10-CM

## 2015-04-21 MED ORDER — VALSARTAN 160 MG PO TABS: 160.0000 mg | ORAL_TABLET | Freq: Every day | ORAL | Status: DC

## 2015-04-21 NOTE — Progress Notes (Signed)
PCP: [name], MD  Clinic Note: Chief Complaint  Patient presents with  . Follow-up    no chest pain, no shortness of breath, has edema, no pain or cramping in legs, has lightheadedness or dizziness, has fatigue  . Pacemaker Problem    Small suture extruding from suture line    HPI: Tara Jackson is a 75 y.o. female with a PMH (recurrent PE, DOE, non-ischemic caridac work-up including cardiac cath ==> no s/p PPM for Sx Bradycardia) below who presents today for a long-overdue f/u with me. I last saw her in July 2015.   She had been put on propranolol by her Neurologist for tremor.  Curtrina Suh was last seen in clinic 2 x in Dec 2016 by Cecilie Kicks, NP then Kerin Ransom, [place]  01/09/15 saw Mickel Baas c/w HR in 30s with hypotension: as Hospital f/u for Bradycardia [facility] in Oct 2016--> r/o for MI, Non-ischemic Myoview; BB dose reduced) --> ARB dose had been reduced to 160 mg & she only took 1/2.  Noted "feeling like lungs filled up with fluid" & LE Edema.  HR 49 & SBP 96.  30 D Event monitor, reduced ARB to 160 mg; Propranolol was stopped.  01/14/15 with Lurena Joiner: noted slow HR in 30s for ~30 min: Monitor ordered  ~ Normal Monitor results (no significant Bradycardia noted)  Recent Hospitalizations:   Oct 2016 --> CP & DOE, Bradycardia (see above)  Admitted 03/24/15 for Sx Bradycardia --> after initial discussion for TPM --> EP consulted & Dr. Lovena Le placed PPM.   Studies Reviewed:   Nuc 11/2014: LOW RISK, EF 60%. No ischemia or infarction. No wall motion abnormality Echo 11/2014: EF 55-60%. Normal wall motion. GR 1 DD. No valve lesions Event monitor 01/14/15: NSR with 1 A-V Block, ~ 2 sec pause  Interval History: [name] presents today pretty much at baseline now. She is happy that she has the pacemaker placed. She still complains of dyspnea, but this is no change. She still has edema and is trying to wear the support hose. Her blood pressures remain stable and she is not had  any further syncopal type symptoms. She has occasional chest twinges but no significant chest discomfort with rest or exertion. She is pretty much minimally mobile and essentially wheelchair-bound for any significant activity.  As a result she is somewhat deconditioned and has exertional dyspnea if she does much. She has little bit of PND and orthopnea associated with edema but no more than baseline.   No palpitations, lightheadedness, dizziness, weakness or syncope/near syncope. No TIA/amaurosis fugax symptoms. No melena, hematochezia, hematuria, or epstaxis. No claudication.  ROS: A comprehensive was performed. Review of Systems  Constitutional: Positive for malaise/fatigue.  HENT: Negative for nosebleeds.   Respiratory: Positive for shortness of breath. Negative for cough, sputum production and wheezing.   Cardiovascular:       Per history of present illness  Gastrointestinal: Negative for blood in stool and melena.  Genitourinary: Negative for hematuria.  Musculoskeletal: Positive for back pain and joint pain.  Neurological: Positive for dizziness (Sometimes positional still). Negative for headaches.  Psychiatric/Behavioral: Negative for memory loss. The patient is nervous/anxious.   All other systems reviewed and are negative.    Past Medical History  Diagnosis Date  . DVT (deep venous thrombosis) ([place])     "18 in RLE; 7 LLE prior to PE" (03/24/2015)  . Asthma   . Hypothyroidism   . Anxiety   . Depression   . H/O  hiatal hernia   . Arthritis   . Anemia   . PE (pulmonary thromboembolism) ([place]) x 3, last one was ~ 2003    history of recurrent RLE DVT with PE - last PE ~>13 yrs; Maintained on Plavix  . Hyperlipidemia   . Diabetes mellitus type 2, insulin dependent ([place])   . Hypertension associated with diabetes ([place])   . Glaucoma   . Cataract   . Diabetic neuropathy ([place])   . Palpitations 35years ago    Cardionet monitor - revealed mostly normal sinus rhythm, sinus  bradycardia with first-degree A-V block heart rates mostly in the 50s and 60s with some 70s. No arrhythmias, PVCs or PACs noted.  Marland Kitchen TIA (transient ischemic attack) March 2014  . Diastolic dysfunction, left ventricle 11/30/14    EF 55%, grade 1 DD  . Stroke [facility]) 04-19-2013    tia and stroke. Weakness Rt hand  . Sleep apnea     cpap disontinued; test done 07/14/2008 ordered per  [place]  . Restless legs syndrome   . Spinal stenosis     L 3, L 4 and L 5, and C 1, C 2 and C3  . Complication of anesthesia     hard to wake up after anesthesia, trouble turning head  . Spinal headache   . Memory loss 06/03/2014  . Osteoporosis   . Normal cardiac stress test 11/30/14    Negative Myoview  . Presence of permanent cardiac pacemaker   . Kidney stones     "passed them"  . On home oxygen therapy     "2L oxygen concentrator @ night"  . Symptomatic bradycardia 03/24/15    [employer] PPM, Dr. Lovena Le  . Brittle bone disease     Past Surgical History  Procedure Laterality Date  . Abdominal hysterectomy    . Cholecystectomy    . Back surgery      steroid inj  . Tonsillectomy    . Appendectomy    . Esophagogastroduodenoscopy  08/09/2011    Procedure: ESOPHAGOGASTRODUODENOSCOPY (EGD);  Surgeon: Jeryl Columbia, MD;  Location: [facility];  Service: Endoscopy;  Laterality: N/A;  . Hot hemostasis  08/09/2011    Procedure: HOT HEMOSTASIS (ARGON PLASMA COAGULATION/BICAP);  Surgeon: Jeryl Columbia, MD;  Location: [facility];  Service: Endoscopy;  Laterality: N/A;  . Trigger finger release  11/22/2011    Procedure: RELEASE TRIGGER FINGER/A-1 PULLEY;  Surgeon: Meredith Pel, MD;  Location: [place];  Service: Orthopedics;  Laterality: Left;  Left trigger thumb release  . Cardiac catheterization  02/14/2012    no evidence of obstructive coronary disease ,low EDP with normal EF  . Doppler echocardiography  2014; October 2016    a. EF 0000000; normal diastolic pressures, no regional wall motion motion  abnormalities;; b/ F 55-60%. Normal wall motion. GR 1 DD. No valve lesions  . Nm myocar perf wall motion  01/26/2012; 11/2014    a. EF 79%,LV normal,ST SEGMENT CHANGE SUGGESTIVE OF ISCHEMIA.; LOW RISK, EF 60%. No ischemia or infarction. No wall motion abnormality   . Lev doppler  05/29/2012    RIGHT EXTREM. NORMAL VENOUS DUPLEX  . Joint replacement    . Knee surgery Left done with 2nd left knee replacement    spacer bar placement  . Cataract extraction w/ intraocular lens  implant, bilateral Bilateral 2000s  . Trigger finger release Right yrs ago  . Esophagogastroduodenoscopy (egd) with propofol N/A 11/12/2013    Procedure: ESOPHAGOGASTRODUODENOSCOPY (EGD) WITH PROPOFOL;  Surgeon: Caryl Bis  Magod, MD;  Location: [facility];  Service: Endoscopy;  Laterality: N/A;  . Hot hemostasis N/A 11/12/2013    Procedure: HOT HEMOSTASIS (ARGON PLASMA COAGULATION/BICAP);  Surgeon: Jeryl Columbia, MD;  Location: [facility];  Service: Endoscopy;  Laterality: N/A;  . Left heart catheterization with coronary angiogram N/A 02/14/2012    Procedure: LEFT HEART CATHETERIZATION WITH CORONARY ANGIOGRAM;  Surgeon: Leonie Man, MD;  Location: [facility] CATH LAB;  Service: Cardiovascular;  Laterality: N/A;  . Insert / replace / remove pacemaker  03/24/2015  . Total knee arthroplasty Bilateral   . Revision total knee arthroplasty Left   . Glaucoma surgery Bilateral 2000s    "laser"  . Ep implantable device N/A 03/24/2015    [employer] PPM, Dr. Lovena Le   Prior to Admission medications   Medication Sig Start Date End Date Taking? Authorizing Provider  acetaminophen (TYLENOL) 500 MG tablet Take 500 mg by mouth every 6 (six) hours as needed.   Yes Historical Provider, MD  albuterol (PROVENTIL HFA;VENTOLIN HFA) 108 (90 BASE) MCG/ACT inhaler Inhale 2 puffs into the lungs every 6 (six) hours as needed for wheezing.   Yes Historical Provider, MD  busPIRone (BUSPAR) 10 MG tablet Take 1 tablet (10 mg total) by mouth every morning.  06/03/14  Yes Garvin Fila, MD  Cholecalciferol (VITAMIN D-3 PO) Take 1 tablet by mouth every morning.    Yes Historical Provider, MD  clonazePAM (KLONOPIN) 0.5 MG tablet 0.5-1 mg 2 (two) times daily. Reported on 03/24/2015   Yes Historical Provider, MD  clopidogrel (PLAVIX) 75 MG tablet Take 75 mg by mouth every morning.   Yes Historical Provider, MD  diclofenac sodium (VOLTAREN) 1 % GEL Apply 2 g topically 3 (three) times daily as needed (to affected area). 11/30/14  Yes Bonnielee Haff, MD  dicyclomine (BENTYL) 10 MG capsule Take 10 mg by mouth 3 (three) times daily as needed for spasms.    Yes Historical Provider, MD  esomeprazole (NEXIUM) 40 MG capsule Take 40 mg by mouth daily before breakfast.   Yes Historical Provider, MD  furosemide (LASIX) 40 MG tablet Take 40 mg by mouth daily.    Yes Historical Provider, MD  glipiZIDE (GLUCOTROL) 10 MG tablet Take 20 mg by mouth 2 (two) times daily before a meal.  05/03/14  Yes Historical Provider, MD  insulin NPH (HUMULIN N,NOVOLIN N) 100 UNIT/ML injection Inject 30 Units into the skin daily before breakfast. Novolin-N   Yes Historical Provider, MD  insulin regular (NOVOLIN R,HUMULIN R) 100 units/mL injection Inject 20 Units into the skin 3 (three) times daily before meals.   Yes Historical Provider, MD  levothyroxine (SYNTHROID, LEVOTHROID) 100 MCG tablet Take 100 mcg by mouth every morning.    Yes Historical Provider, MD  lidocaine (LIDODERM) 5 % Place 3 patches onto the skin daily. Remove & Discard patch within 12 hours or as directed by MD   Yes Historical Provider, MD  Linaclotide (LINZESS) 290 MCG CAPS capsule Take 290 mcg by mouth every morning.    Yes Historical Provider, MD  LORazepam (ATIVAN) 0.5 MG tablet TK 1 T PO AS NEEDED FOR ANXIETY ATTACK 09/10/14  Yes Historical Provider, MD  meclizine (ANTIVERT) 12.5 MG tablet Take 12.5 mg by mouth 3 (three) times daily as needed for dizziness.    Yes Historical Provider, MD  nortriptyline (PAMELOR) 50 MG  capsule Take 2 capsules (100 mg total) by mouth at bedtime. 04/07/15  Yes Garvin Fila, MD  polyethylene glycol (MIRALAX / Floria Raveling)  packet Take 17 g by mouth daily as needed for mild constipation.   Yes Historical Provider, MD  potassium chloride (MICRO-K) 10 MEQ CR capsule Take 10 mEq by mouth daily as needed (leg cramps).    Yes Historical Provider, MD  rosuvastatin (CRESTOR) 20 MG tablet Take 20 mg by mouth every morning.    Yes Historical Provider, MD  Sennosides (EX-LAX PO) Take 1 tablet by mouth daily as needed (constipation).    Yes Historical Provider, MD  sucralfate (CARAFATE) 1 G tablet Take 1-2 g by mouth daily.    Yes Historical Provider, MD  terconazole (TERAZOL 7) 0.4 % vaginal cream Place 1 applicator vaginally at bedtime.  04/18/14  Yes Historical Provider, MD  traMADol (ULTRAM) 50 MG tablet Take 1 tablet (50 mg total) by mouth every 12 (twelve) hours as needed for moderate pain. 03/27/15  Yes Bhavinkumar Bhagat, PA  valsartan (DIOVAN) 160 MG tablet Take 1 tablet (160 mg total) by mouth daily. 01/09/15  Yes Isaiah Serge, NP   Allergies  Allergen Reactions  . Iodine Other (See Comments)    ONLY IV--Makes unconscious  . Metoclopramide Hcl Other (See Comments)    suicidal  . Sulfa Antibiotics Hives  . Iohexol      Code: SOB, Desc: cardiac arrest w/ iv contrast, has never used 13 hr prep//alice calhoun, Onset Date: [id number]   . Lactose Intolerance (Gi) Diarrhea  . Lansoprazole Nausea And Vomiting    Social History   Social History  . Marital Status: Married    Spouse Name: [name]  . Number of Children: 2  . Years of Education: college   Occupational History  . retired    Social History Main Topics  . Smoking status: Never Smoker   . Smokeless tobacco: Never Used  . Alcohol Use: No  . Drug Use: No  . Sexual Activity: Yes   Other Topics Concern  . None   Social History Narrative   Married [name].) married 20 years.   Disabled.   Arboriculturist.   Right  handed.   Caffeine two sodas daily.       Family History  Problem Relation Age of Onset  . Cancer Mother   . Cancer - Prostate Father      Wt Readings from Last 3 Encounters:  04/21/15 291 lb 6 oz (132.167 kg)  04/07/15 290 lb (131.543 kg)  03/25/15 284 lb 6.3 oz (129 kg)  -- up since Hospital (287 on Dec 2nd)  PHYSICAL EXAM BP 130/70 mmHg  Pulse 64  Ht 5\' 7"  (1.702 m)  Wt 291 lb 6 oz (132.167 kg)  BMI 45.63 kg/m2 General appearance: alert, cooperative, appears stated age, no distress, morbidly obese and mostly truncal obesity. Pleasant mood and affect. Well groomed. Seems to be in better spirits than last visit. She is sitting in a wheelchair. Neck: no carotid bruit and no JVD  Lungs: clear to auscultation bilaterally, normal percussion bilaterally and nonlabored, good air movement.  Heart: regular rate and rhythm, S1, S2 normal, no murmur, click, rub or gallop and normal apical impulse  Abdomen: soft, non-tender; bowel sounds normal; no masses, no organomegaly and truncal obesity noted.  Extremities: extremities normal, atraumatic, no cyanosis; trace lower ankle only edema. Not really true bulimia. Pulses: 2+ and symmetric  Neurologic: Grossly normal Pacemaker site looks clean dry and intact, however there are 2 small suture threads extruding from the suture line.    Adult ECG Report Not checked  Other studies Reviewed: Additional  studies/ records that were reviewed today include:  Recent Labs:   Lab Results  Component Value Date   CREATININE 1.64* 04/09/2015   Lab Results  Component Value Date   K 4.9 04/09/2015    ASSESSMENT / PLAN: Problem List Items Addressed This Visit    Sinus node dysfunction ([place]) (Chronic)    Now status post pacemaker placed. I tried explaining to them the constant backup pacing but also rate responsiveness to activity. She should feel fine trying to do activities in order to get her heart rate up.      Relevant Medications    valsartan (DIOVAN) 160 MG tablet   Pacemaker (Chronic)    I've asked her to contact the Electrophysiology Clinic at the [facility] office in order to go in to be evaluated with the suture lines. I think we need to be removed. I structure make sure that she keeps the site clean at all times. No signs of infection currently.      Hyperlipidemia with target LDL less than 130 (Chronic)    On Crestor. Monitored by PCP.      Relevant Medications   valsartan (DIOVAN) 160 MG tablet   Essential hypertension (Chronic)    Stable blood pressure with current medicine. Has had history of hypotension. Need to monitor closely.      Relevant Medications   valsartan (DIOVAN) 160 MG tablet   Chronic diastolic heart failure (HCC) (Chronic)    She does have some PND, orthopnea with mild edema. I encouraged her to continue wearing support hose. I also told that she can take an additional dose of Lasix until her weight gets back to where it was at baseline. She is currently several pounds up from her post hospital discharge weight. We discussed sliding scale Lasix with daily weights.  She is on a stable dose of ARB, no longer on beta blocker, however we probably could use a beta blocker now that she has pacemaker. I will defer this to her neurologist who had placed her on propranolol.      Relevant Medications   valsartan (DIOVAN) 160 MG tablet   Bradycardia - Primary   Relevant Medications   valsartan (DIOVAN) 160 MG tablet   Ankle edema (Chronic)    Probably in some part related to diastolic dysfunction, however also venous stasis. Continue support hose. Continue diuresis with sliding scale.       Other Visit Diagnoses    Acute combined systolic and diastolic heart failure (HCC)        Relevant Medications    valsartan (DIOVAN) 160 MG tablet       Current medicines are reviewed at length with the patient today. (+/- concerns) Still scared of her medicines, not sure how to take her  Lasix The following changes have been made:  IF YOU HAVE AN  INCREASE IN WEIGHT-  MORE THAN 3 LBS IN A NIGHT ,MAY TAKE 1/2 DOSE OF FLUIDS PILL EXTRA -- this includes for the next several days.  IF YOU FEEL  LIKE YOU ARE DEHYDRATED YOU MAY HOLD FLUID PILL THAT DAY  MAY USE SUPPORT HOSE - KNEE HI  - MAY PURCHASE OVER THE COUNTER TAKE OFF AT NIGHT  Your physician wants you to follow-up in [place].  Studies Ordered:   No orders of the defined types were placed in this encounter.      Leonie Man, M.D., M.S. Interventional Cardiologist   Pager # [phone] Phone # [phone]  [address] [place], [place] [address]

## 2015-04-21 NOTE — Patient Instructions (Signed)
IF YOU HAVE AN  INCREASE IN WEIGHT-  MORE THAN 3 LBS IN A NIGHT ,MAY TAKE 1/2 DOSE OF FLUIDS PILL EXTRA  IF YOU FEEL  LIKE YOU ARE DEHYDRATED YOU MAY HOLD FLUID PILL THAT DAY  MAY USE SUPPORT HOSE - KNEE HI  - MAY PURCHASE OVER THE COUNTER TAKE OFF AT NIGHT  Your physician wants you to follow-up in [place].  You will receive a reminder letter in the mail two months in advance. If you don't receive a letter, please call our office to schedule the follow-up appointment.  If you need a refill on your cardiac medications before your next appointment, please call your pharmacy.

## 2015-04-22 DIAGNOSIS — I509 Heart failure, unspecified: Secondary | ICD-10-CM | POA: Diagnosis not present

## 2015-04-22 DIAGNOSIS — I10 Essential (primary) hypertension: Secondary | ICD-10-CM | POA: Diagnosis not present

## 2015-04-23 DIAGNOSIS — Z950 Presence of cardiac pacemaker: Secondary | ICD-10-CM | POA: Diagnosis not present

## 2015-04-23 DIAGNOSIS — I129 Hypertensive chronic kidney disease with stage 1 through stage 4 chronic kidney disease, or unspecified chronic kidney disease: Secondary | ICD-10-CM | POA: Diagnosis not present

## 2015-04-23 DIAGNOSIS — Z6841 Body Mass Index (BMI) 40.0 and over, adult: Secondary | ICD-10-CM | POA: Diagnosis not present

## 2015-04-23 DIAGNOSIS — N183 Chronic kidney disease, stage 3 (moderate): Secondary | ICD-10-CM | POA: Diagnosis not present

## 2015-04-23 DIAGNOSIS — R001 Bradycardia, unspecified: Secondary | ICD-10-CM | POA: Diagnosis not present

## 2015-04-23 DIAGNOSIS — I10 Essential (primary) hypertension: Secondary | ICD-10-CM | POA: Diagnosis not present

## 2015-04-26 ENCOUNTER — Encounter: Payer: Self-pay | Admitting: Cardiology

## 2015-04-26 DIAGNOSIS — Z950 Presence of cardiac pacemaker: Secondary | ICD-10-CM | POA: Insufficient documentation

## 2015-04-26 NOTE — Assessment & Plan Note (Signed)
She does have some PND, orthopnea with mild edema. I encouraged her to continue wearing support hose. I also told that she can take an additional dose of Lasix until her weight gets back to where it was at baseline. She is currently several pounds up from her post hospital discharge weight. We discussed sliding scale Lasix with daily weights.  She is on a stable dose of ARB, no longer on beta blocker, however we probably could use a beta blocker now that she has pacemaker. I will defer this to her neurologist who had placed her on propranolol.

## 2015-04-26 NOTE — Assessment & Plan Note (Signed)
I've asked her to contact the Electrophysiology Clinic at the [name] office in order to go in to be evaluated with the suture lines. I think we need to be removed. I structure make sure that she keeps the site clean at all times. No signs of infection currently.

## 2015-04-26 NOTE — Assessment & Plan Note (Signed)
On Crestor. Monitored by PCP.

## 2015-04-26 NOTE — Assessment & Plan Note (Signed)
Now status post pacemaker placed. I tried explaining to them the constant backup pacing but also rate responsiveness to activity. She should feel fine trying to do activities in order to get her heart rate up.

## 2015-04-26 NOTE — Assessment & Plan Note (Signed)
Stable blood pressure with current medicine. Has had history of hypotension. Need to monitor closely.

## 2015-04-26 NOTE — Assessment & Plan Note (Signed)
Probably in some part related to diastolic dysfunction, however also venous stasis. Continue support hose. Continue diuresis with sliding scale.

## 2015-04-30 ENCOUNTER — Telehealth: Payer: Self-pay | Admitting: Internal Medicine

## 2015-04-30 NOTE — Telephone Encounter (Signed)
New message  Pt called states that her pacer is sticking out. Please call back to discuss

## 2015-04-30 NOTE — Telephone Encounter (Signed)
The patient reports that a suture is sticking out of her incision that needs to be cut. She denies swelling, redness, drainage, fever or chills. She reports that she is having a really bad cold. I offered her an appt tomorrow for device clinic- she says that she is having new carpet installed and cannot come until Tuesday 05/05/15. I put her on the device clinic schedule 3/28/7 at 10:30 and advised her to clean the incision with antibacterial soap when she is bathing and to avoid ointments or creams on the incision- she verbalizes that she is compliant with these suggestions. I instructed her to call us back if she experiences redness, swelling or drainage at the device site or if she has fever or chills. She verbalizes understanding and is appreciative.

## 2015-05-05 ENCOUNTER — Ambulatory Visit (INDEPENDENT_AMBULATORY_CARE_PROVIDER_SITE_OTHER): Payer: Medicare HMO | Admitting: *Deleted

## 2015-05-05 DIAGNOSIS — I10 Essential (primary) hypertension: Secondary | ICD-10-CM | POA: Diagnosis not present

## 2015-05-05 DIAGNOSIS — Z6841 Body Mass Index (BMI) 40.0 and over, adult: Secondary | ICD-10-CM | POA: Diagnosis not present

## 2015-05-05 DIAGNOSIS — Z794 Long term (current) use of insulin: Secondary | ICD-10-CM | POA: Diagnosis not present

## 2015-05-05 DIAGNOSIS — J069 Acute upper respiratory infection, unspecified: Secondary | ICD-10-CM | POA: Diagnosis not present

## 2015-05-05 DIAGNOSIS — E1140 Type 2 diabetes mellitus with diabetic neuropathy, unspecified: Secondary | ICD-10-CM | POA: Diagnosis not present

## 2015-05-05 DIAGNOSIS — Z950 Presence of cardiac pacemaker: Secondary | ICD-10-CM | POA: Diagnosis not present

## 2015-05-05 DIAGNOSIS — R51 Headache: Secondary | ICD-10-CM | POA: Diagnosis not present

## 2015-05-05 NOTE — Progress Notes (Signed)
Patient seen today due to a stitch visible in incision noted by Dr. Ellyn Hack 04/21/15. Upon examination incision is without redness, swelling or drainage. Stitch clipped- antibiotic ointment and bandaid applied over pinhole. I educated the patient and her family member to remove bandaid tomorrow and clean the incision twice daily with warm water, antibacterial soap and a washcloth. I have instructed them to call us back if she notes any redness, swelling, drainage, fever over 101 degrees, chills or if the pinhole does not close by Tuesday next week. She verbalizes understanding and she reports that she has been battling a cold and has been having fevers 99-100 degrees. She reports that she has an appt with her PCP today. ROV with GT 06/24/15.

## 2015-05-12 ENCOUNTER — Telehealth: Payer: Self-pay | Admitting: Physician Assistant

## 2015-05-12 NOTE — Telephone Encounter (Signed)
Dr Ellyn Hack pt.  Called pt back and let her know that we cannot interrogate PPM after hours. She is sure she is not dehydrated, does not know what her BP is, does not weigh daily.  Let her know that the office can call her and tell her when she can be seen. If she needs to be seen sooner, she needs to come to the ER. Pt prefers to wait for office visit and will come to ER if sx get worse. She is not SOB at rest and is having no chest pain.  Lenoard Aden 05/12/2015 6:51 PM Beeper [phone]

## 2015-05-12 NOTE — Telephone Encounter (Signed)
Tara Jackson is a 75 yo female w/ hx [employer] PPM put in for bradycardia, nl EF.  Pt states she has noticed today that her HR will go up to 100 with minimal activity.  She has been getting very SOB walking across the room. Symptoms improve with rest.  She is worried about all this. She admits she does not exercise, but this level of DOE is new for her.

## 2015-05-13 NOTE — Telephone Encounter (Signed)
She really only has the Bradycardia issue as a cardiac issue.  She has had a pretty extensive ischemic evaluation = I doubt anginal equivalent.    ? Maybe she has Pacemaker related LV dysfunction - can recheck Echo.  Otherwise, I really cannot think of what this is caused by.  Leonie Man, MD

## 2015-05-14 DIAGNOSIS — E1149 Type 2 diabetes mellitus with other diabetic neurological complication: Secondary | ICD-10-CM | POA: Diagnosis not present

## 2015-05-23 DIAGNOSIS — I509 Heart failure, unspecified: Secondary | ICD-10-CM | POA: Diagnosis not present

## 2015-05-23 DIAGNOSIS — I10 Essential (primary) hypertension: Secondary | ICD-10-CM | POA: Diagnosis not present

## 2015-05-25 ENCOUNTER — Telehealth: Payer: Self-pay | Admitting: Cardiology

## 2015-05-25 NOTE — Telephone Encounter (Signed)
Scheduler called patient Tara Jackson will call patient back

## 2015-05-25 NOTE — Telephone Encounter (Signed)
Follow up      Patient calling stating someone call her today - returning call back

## 2015-05-28 ENCOUNTER — Ambulatory Visit: Payer: Medicare HMO | Admitting: Cardiology

## 2015-06-12 ENCOUNTER — Emergency Department (HOSPITAL_COMMUNITY): Payer: Medicare HMO

## 2015-06-12 ENCOUNTER — Encounter (HOSPITAL_COMMUNITY): Payer: Self-pay | Admitting: Emergency Medicine

## 2015-06-12 ENCOUNTER — Emergency Department (HOSPITAL_BASED_OUTPATIENT_CLINIC_OR_DEPARTMENT_OTHER)
Admit: 2015-06-12 | Discharge: 2015-06-12 | Disposition: A | Discharge status: Home / Self Care | Payer: Medicare HMO | Attending: Emergency Medicine | Admitting: Emergency Medicine

## 2015-06-12 ENCOUNTER — Emergency Department (HOSPITAL_COMMUNITY)
Admission: EM | Admit: 2015-06-12 | Discharge: 2015-06-12 | Disposition: A | Discharge status: Home / Self Care | Payer: Medicare HMO | Attending: Emergency Medicine | Admitting: Emergency Medicine

## 2015-06-12 DIAGNOSIS — J45901 Unspecified asthma with (acute) exacerbation: Secondary | ICD-10-CM | POA: Insufficient documentation

## 2015-06-12 DIAGNOSIS — Z86711 Personal history of pulmonary embolism: Secondary | ICD-10-CM | POA: Diagnosis not present

## 2015-06-12 DIAGNOSIS — R0789 Other chest pain: Secondary | ICD-10-CM | POA: Diagnosis not present

## 2015-06-12 DIAGNOSIS — E785 Hyperlipidemia, unspecified: Secondary | ICD-10-CM | POA: Diagnosis not present

## 2015-06-12 DIAGNOSIS — Z79899 Other long term (current) drug therapy: Secondary | ICD-10-CM | POA: Diagnosis not present

## 2015-06-12 DIAGNOSIS — F329 Major depressive disorder, single episode, unspecified: Secondary | ICD-10-CM | POA: Insufficient documentation

## 2015-06-12 DIAGNOSIS — Z7902 Long term (current) use of antithrombotics/antiplatelets: Secondary | ICD-10-CM | POA: Diagnosis not present

## 2015-06-12 DIAGNOSIS — Z87442 Personal history of urinary calculi: Secondary | ICD-10-CM | POA: Diagnosis not present

## 2015-06-12 DIAGNOSIS — G4730 Sleep apnea, unspecified: Secondary | ICD-10-CM | POA: Diagnosis not present

## 2015-06-12 DIAGNOSIS — Z862 Personal history of diseases of the blood and blood-forming organs and certain disorders involving the immune mechanism: Secondary | ICD-10-CM | POA: Insufficient documentation

## 2015-06-12 DIAGNOSIS — R0602 Shortness of breath: Secondary | ICD-10-CM

## 2015-06-12 DIAGNOSIS — R600 Localized edema: Secondary | ICD-10-CM | POA: Insufficient documentation

## 2015-06-12 DIAGNOSIS — R079 Chest pain, unspecified: Secondary | ICD-10-CM | POA: Insufficient documentation

## 2015-06-12 DIAGNOSIS — Z9981 Dependence on supplemental oxygen: Secondary | ICD-10-CM | POA: Insufficient documentation

## 2015-06-12 DIAGNOSIS — Z86718 Personal history of other venous thrombosis and embolism: Secondary | ICD-10-CM | POA: Insufficient documentation

## 2015-06-12 DIAGNOSIS — Z8673 Personal history of transient ischemic attack (TIA), and cerebral infarction without residual deficits: Secondary | ICD-10-CM | POA: Diagnosis not present

## 2015-06-12 DIAGNOSIS — Z794 Long term (current) use of insulin: Secondary | ICD-10-CM | POA: Insufficient documentation

## 2015-06-12 DIAGNOSIS — Z7982 Long term (current) use of aspirin: Secondary | ICD-10-CM | POA: Insufficient documentation

## 2015-06-12 DIAGNOSIS — F419 Anxiety disorder, unspecified: Secondary | ICD-10-CM | POA: Insufficient documentation

## 2015-06-12 DIAGNOSIS — M1990 Unspecified osteoarthritis, unspecified site: Secondary | ICD-10-CM | POA: Insufficient documentation

## 2015-06-12 DIAGNOSIS — E1140 Type 2 diabetes mellitus with diabetic neuropathy, unspecified: Secondary | ICD-10-CM | POA: Diagnosis not present

## 2015-06-12 DIAGNOSIS — Z9889 Other specified postprocedural states: Secondary | ICD-10-CM | POA: Diagnosis not present

## 2015-06-12 DIAGNOSIS — E039 Hypothyroidism, unspecified: Secondary | ICD-10-CM | POA: Insufficient documentation

## 2015-06-12 DIAGNOSIS — Z7984 Long term (current) use of oral hypoglycemic drugs: Secondary | ICD-10-CM | POA: Diagnosis not present

## 2015-06-12 DIAGNOSIS — R791 Abnormal coagulation profile: Secondary | ICD-10-CM | POA: Diagnosis not present

## 2015-06-12 DIAGNOSIS — R69 Illness, unspecified: Secondary | ICD-10-CM | POA: Diagnosis not present

## 2015-06-12 DIAGNOSIS — R7989 Other specified abnormal findings of blood chemistry: Secondary | ICD-10-CM

## 2015-06-12 LAB — COMPREHENSIVE METABOLIC PANEL
ALT: 10 U/L — ABNORMAL LOW (ref 14–54)
AST: 19 U/L (ref 15–41)
Albumin: 3.3 g/dL — ABNORMAL LOW (ref 3.5–5.0)
Alkaline Phosphatase: 52 U/L (ref 38–126)
Anion gap: 13 (ref 5–15)
BUN: 17 mg/dL (ref 6–20)
CO2: 26 mmol/L (ref 22–32)
Calcium: 9.2 mg/dL (ref 8.9–10.3)
Chloride: 102 mmol/L (ref 101–111)
Creatinine, Ser: 1.54 mg/dL — ABNORMAL HIGH (ref 0.44–1.00)
GFR calc Af Amer: 37 mL/min — ABNORMAL LOW (ref >60)
GFR calc non Af Amer: 32 mL/min — ABNORMAL LOW (ref >60)
Glucose, Bld: 183 mg/dL — ABNORMAL HIGH (ref 65–99)
Potassium: 3.4 mmol/L — ABNORMAL LOW (ref 3.5–5.1)
Sodium: 141 mmol/L (ref 135–145)
Total Bilirubin: 0.4 mg/dL (ref 0.3–1.2)
Total Protein: 6.5 g/dL (ref 6.5–8.1)

## 2015-06-12 LAB — BRAIN NATRIURETIC PEPTIDE: B Natriuretic Peptide: 47.7 pg/mL (ref 0.0–100.0)

## 2015-06-12 LAB — CBC
HCT: 34.3 % — ABNORMAL LOW (ref 36.0–46.0)
Hemoglobin: 11.1 g/dL — ABNORMAL LOW (ref 12.0–15.0)
MCH: 31.1 pg (ref 26.0–34.0)
MCHC: 32.4 g/dL (ref 30.0–36.0)
MCV: 96.1 fL (ref 78.0–100.0)
Platelets: 287 10*3/uL (ref 150–400)
RBC: 3.57 MIL/uL — ABNORMAL LOW (ref 3.87–5.11)
RDW: 12.9 % (ref 11.5–15.5)
WBC: 7.6 10*3/uL (ref 4.0–10.5)

## 2015-06-12 LAB — I-STAT TROPONIN, ED
Troponin i, poc: 0 ng/mL (ref 0.00–0.08)
Troponin i, poc: 0.01 ng/mL (ref 0.00–0.08)

## 2015-06-12 LAB — URINALYSIS, ROUTINE W REFLEX MICROSCOPIC
Bilirubin Urine: NEGATIVE
Glucose, UA: NEGATIVE mg/dL
Hgb urine dipstick: NEGATIVE
Ketones, ur: NEGATIVE mg/dL
Leukocytes, UA: NEGATIVE
Nitrite: NEGATIVE
Protein, ur: NEGATIVE mg/dL
Specific Gravity, Urine: 1.011 (ref 1.005–1.030)
pH: 5.5 (ref 5.0–8.0)

## 2015-06-12 LAB — APTT: aPTT: 30 seconds (ref 24–37)

## 2015-06-12 LAB — D-DIMER, QUANTITATIVE: D-Dimer, Quant: 0.67 ug/mL-FEU — ABNORMAL HIGH (ref 0.00–0.50)

## 2015-06-12 LAB — PROTIME-INR
INR: 0.98 (ref 0.00–1.49)
Prothrombin Time: 13.2 seconds (ref 11.6–15.2)

## 2015-06-12 MED ORDER — ASPIRIN 81 MG PO CHEW: 324.0000 mg | CHEWABLE_TABLET | Freq: Once | ORAL | Status: DC

## 2015-06-12 MED ORDER — ONDANSETRON HCL 4 MG/2ML IJ SOLN
4.0000 mg | Freq: Once | INTRAMUSCULAR | Status: AC
  Administered 2015-06-12: 4 mg via INTRAVENOUS
  Filled 2015-06-12: qty 2

## 2015-06-12 MED ORDER — TECHNETIUM TO 99M ALBUMIN AGGREGATED
4.0000 | Freq: Once | INTRAVENOUS | Status: AC | PRN
  Administered 2015-06-12: 4 via INTRAVENOUS

## 2015-06-12 MED ORDER — TECHNETIUM TC 99M DIETHYLENETRIAME-PENTAACETIC ACID: 30.0000 | Freq: Once | INTRAVENOUS | Status: DC | PRN

## 2015-06-12 MED ORDER — MORPHINE SULFATE (PF) 4 MG/ML IV SOLN
4.0000 mg | Freq: Once | INTRAVENOUS | Status: AC
  Administered 2015-06-12: 4 mg via INTRAVENOUS
  Filled 2015-06-12: qty 1

## 2015-06-12 NOTE — Progress Notes (Signed)
VASCULAR LAB PRELIMINARY  PRELIMINARY  PRELIMINARY  PRELIMINARY  Bilateral lower extremity venous duplex  completed.    Preliminary report:  Bilateral:  No evidence of DVT, superficial thrombosis, or Baker's Cyst.    Trenese Haft, RVT 06/12/2015, 11:23 AM

## 2015-06-12 NOTE — Discharge Instructions (Signed)
You have been seen today for chest pain and shortness of breath. Your imaging and lab tests showed no abnormalities, specifically, no evidence of heart attack, heart failure, pulmonary embolism, or DVT. Follow up with PCP as needed. Return to ED should symptoms worsen.

## 2015-06-12 NOTE — ED Provider Notes (Signed)
Patient presented to the ER with chest pain. Patient experiencing shortness of breath and chest pain that started early this morning. She does have a history of pulmonary embolism and feels like the pain that she is currently experiencing is similar.  Face to face Exam: HEENT - PERRLA Lungs - CTAB Heart - RRR, no M/R/G Abd - S/NT/ND Neuro - alert, oriented x3  Plan: CT angiography to evaluate for possible recurrent PE. Patient has had extensive cardiac evaluation including recent nuclear medicine stress test that was low risk and cardiac catheterization in 2013 that showed no obstructive disease.  Orpah Greek, MD 06/12/15 [phone]

## 2015-06-12 NOTE — ED Notes (Signed)
Patient transported to X-ray 

## 2015-06-12 NOTE — ED Provider Notes (Signed)
CSN: [id number]     Arrival date & time 06/12/15  0615 History   First MD Initiated Contact with Patient 06/12/15 0631     Chief Complaint  Patient presents with  . Chest Pain     (Consider location/radiation/quality/duration/timing/severity/associated sxs/prior Treatment) HPI   Tara Jackson is a 75 y.o. female, with a history of PE, DVT, DM, cardiac pacemaker, hypertension, and stroke, presenting to the ED with chest and mid-back pain. Back pain began three days ago and chest pain began about 12 hours ago. Pt states she has a history of PE and this current pain feels similar. Pt also endorses shortness of breath, "Like I'm not getting enough air." Pt wears 2L O2 at night, which has not helped with the current shortnesss of breath. Last PE was 20 years ago in [place]. Pt rates her back pain at 10/10, located in the right middle back, feels like a tightness, nonradiating. Pt rates her chest pain at 5/10, describes it as sharp, nonradiating. Pt took her 325mg  ASA this morning, but has not taken her Plavix. Pt endorses recent stress with the death of her brother four days ago. When asked if she has had any immobilization, she states, "I don't move around a lot. I tend to just sit all day." Pt denies cough, N/V, hemoptysis, unilateral leg swelling, orthopnea, fever/chills, or any other complaints. Pt states she takes a Plavix everyday and has been compliant, but doesn't usually take an aspirin daily.    Past Medical History  Diagnosis Date  . DVT (deep venous thrombosis) ([place])     "18 in RLE; 7 LLE prior to PE" (03/24/2015)  . Asthma   . Hypothyroidism   . Anxiety   . Depression   . H/O hiatal hernia   . Arthritis   . Anemia   . PE (pulmonary thromboembolism) ([place]) x 3, last one was ~ 2003    history of recurrent RLE DVT with PE - last PE ~>13 yrs; Maintained on Plavix  . Hyperlipidemia   . Diabetes mellitus type 2, insulin dependent ([place])   . Hypertension associated with diabetes ([place])   .  Glaucoma   . Cataract   . Diabetic neuropathy ([place])   . Palpitations 35years ago    Cardionet monitor - revealed mostly normal sinus rhythm, sinus bradycardia with first-degree A-V block heart rates mostly in the 50s and 60s with some 70s. No arrhythmias, PVCs or PACs noted.  Marland Kitchen TIA (transient ischemic attack) March 2014  . Diastolic dysfunction, left ventricle 11/30/14    EF 55%, grade 1 DD  . Stroke [facility]) 04-19-2013    tia and stroke. Weakness Rt hand  . Sleep apnea     cpap disontinued; test done 07/14/2008 ordered per  [place]  . Restless legs syndrome   . Spinal stenosis     L 3, L 4 and L 5, and C 1, C 2 and C3  . Complication of anesthesia     hard to wake up after anesthesia, trouble turning head  . Spinal headache   . Memory loss 06/03/2014  . Osteoporosis   . Normal cardiac stress test 11/30/14    Negative Myoview  . Presence of permanent cardiac pacemaker   . Kidney stones     "passed them"  . On home oxygen therapy     "2L oxygen concentrator @ night"  . Symptomatic bradycardia 03/24/15    [employer] PPM, Dr. Lovena Le  . Brittle bone disease    Past  Surgical History  Procedure Laterality Date  . Abdominal hysterectomy    . Cholecystectomy    . Back surgery      steroid inj  . Tonsillectomy    . Appendectomy    . Esophagogastroduodenoscopy  08/09/2011    Procedure: ESOPHAGOGASTRODUODENOSCOPY (EGD);  Surgeon: Jeryl Columbia, MD;  Location: [facility];  Service: Endoscopy;  Laterality: N/A;  . Hot hemostasis  08/09/2011    Procedure: HOT HEMOSTASIS (ARGON PLASMA COAGULATION/BICAP);  Surgeon: Jeryl Columbia, MD;  Location: [facility];  Service: Endoscopy;  Laterality: N/A;  . Trigger finger release  11/22/2011    Procedure: RELEASE TRIGGER FINGER/A-1 PULLEY;  Surgeon: Meredith Pel, MD;  Location: [place];  Service: Orthopedics;  Laterality: Left;  Left trigger thumb release  . Cardiac catheterization  02/14/2012    no evidence of obstructive coronary disease ,low  EDP with normal EF  . Doppler echocardiography  2014; October 2016    a. EF 0000000; normal diastolic pressures, no regional wall motion motion abnormalities;; b/ F 55-60%. Normal wall motion. GR 1 DD. No valve lesions  . Nm myocar perf wall motion  01/26/2012; 11/2014    a. EF 79%,LV normal,ST SEGMENT CHANGE SUGGESTIVE OF ISCHEMIA.; LOW RISK, EF 60%. No ischemia or infarction. No wall motion abnormality   . Lev doppler  05/29/2012    RIGHT EXTREM. NORMAL VENOUS DUPLEX  . Joint replacement    . Knee surgery Left done with 2nd left knee replacement    spacer bar placement  . Cataract extraction w/ intraocular lens  implant, bilateral Bilateral 2000s  . Trigger finger release Right yrs ago  . Esophagogastroduodenoscopy (egd) with propofol N/A 11/12/2013    Procedure: ESOPHAGOGASTRODUODENOSCOPY (EGD) WITH PROPOFOL;  Surgeon: Jeryl Columbia, MD;  Location: [facility];  Service: Endoscopy;  Laterality: N/A;  . Hot hemostasis N/A 11/12/2013    Procedure: HOT HEMOSTASIS (ARGON PLASMA COAGULATION/BICAP);  Surgeon: Jeryl Columbia, MD;  Location: [facility];  Service: Endoscopy;  Laterality: N/A;  . Left heart catheterization with coronary angiogram N/A 02/14/2012    Procedure: LEFT HEART CATHETERIZATION WITH CORONARY ANGIOGRAM;  Surgeon: Leonie Man, MD;  Location: [facility] CATH LAB;  Service: Cardiovascular;  Laterality: N/A;  . Insert / replace / remove pacemaker  03/24/2015  . Total knee arthroplasty Bilateral   . Revision total knee arthroplasty Left   . Glaucoma surgery Bilateral 2000s    "laser"  . Ep implantable device N/A 03/24/2015    [employer] PPM, Dr. Lovena Le   Family History  Problem Relation Age of Onset  . Cancer Mother   . Cancer - Prostate Father    Social History  Substance Use Topics  . Smoking status: Never Smoker   . Smokeless tobacco: Never Used  . Alcohol Use: No   OB History    No data available     Review of Systems  Constitutional: Negative for fever, chills  and diaphoresis.  Eyes: Negative for visual disturbance.  Respiratory: Positive for shortness of breath. Negative for cough.   Cardiovascular: Positive for chest pain.  Gastrointestinal: Negative for nausea and vomiting.  Genitourinary: Negative for dysuria, hematuria and flank pain.  Musculoskeletal: Positive for back pain.  Neurological: Negative for dizziness, syncope, weakness, light-headedness, numbness and headaches.  All other systems reviewed and are negative.     Allergies  Iodine; Metoclopramide hcl; Sulfa antibiotics; Iohexol; Lactose intolerance (gi); and Lansoprazole  Home Medications   Prior to Admission medications   Medication Sig Start Date  End Date Taking? Authorizing Provider  acetaminophen (TYLENOL) 500 MG tablet Take 500 mg by mouth every 6 (six) hours as needed.   Yes Historical Provider, MD  albuterol (PROVENTIL HFA;VENTOLIN HFA) 108 (90 BASE) MCG/ACT inhaler Inhale 2 puffs into the lungs every 6 (six) hours as needed for wheezing.   Yes Historical Provider, MD  aspirin 325 MG tablet Take 325 mg by mouth once.   Yes Historical Provider, MD  busPIRone (BUSPAR) 10 MG tablet Take 1 tablet (10 mg total) by mouth every morning. 06/03/14  Yes Garvin Fila, MD  Cholecalciferol (VITAMIN D-3 PO) Take 1 tablet by mouth every morning.    Yes Historical Provider, MD  clonazePAM (KLONOPIN) 0.5 MG tablet Take 0.5-1 mg by mouth 2 (two) times daily. Reported on 03/24/2015   Yes Historical Provider, MD  clopidogrel (PLAVIX) 75 MG tablet Take 75 mg by mouth every morning.   Yes Historical Provider, MD  diclofenac sodium (VOLTAREN) 1 % GEL Apply 2 g topically 3 (three) times daily as needed (to affected area). 11/30/14  Yes Bonnielee Haff, MD  dicyclomine (BENTYL) 10 MG capsule Take 10 mg by mouth 3 (three) times daily as needed for spasms.    Yes Historical Provider, MD  esomeprazole (NEXIUM) 40 MG capsule Take 40 mg by mouth daily before breakfast.   Yes Historical Provider, MD   furosemide (LASIX) 40 MG tablet Take 40 mg by mouth daily.    Yes Historical Provider, MD  glipiZIDE (GLUCOTROL) 10 MG tablet Take 20 mg by mouth 2 (two) times daily before a meal.  05/03/14  Yes Historical Provider, MD  insulin NPH (HUMULIN N,NOVOLIN N) 100 UNIT/ML injection Inject 30 Units into the skin daily before breakfast. Novolin-N   Yes Historical Provider, MD  insulin regular (NOVOLIN R,HUMULIN R) 100 units/mL injection Inject 20 Units into the skin 3 (three) times daily before meals.   Yes Historical Provider, MD  levothyroxine (SYNTHROID, LEVOTHROID) 100 MCG tablet Take 100 mcg by mouth every morning.    Yes Historical Provider, MD  lidocaine (LIDODERM) 5 % Place 3 patches onto the skin daily. Remove & Discard patch within 12 hours or as directed by MD   Yes Historical Provider, MD  Linaclotide (LINZESS) 290 MCG CAPS capsule Take 290 mcg by mouth every morning.    Yes Historical Provider, MD  LORazepam (ATIVAN) 0.5 MG tablet Take 0.5 mg by mouth daily as needed for anxiety.   Yes Historical Provider, MD  meclizine (ANTIVERT) 12.5 MG tablet Take 12.5 mg by mouth 3 (three) times daily as needed for dizziness.    Yes Historical Provider, MD  nortriptyline (PAMELOR) 50 MG capsule Take 2 capsules (100 mg total) by mouth at bedtime. 04/07/15  Yes Garvin Fila, MD  pantoprazole (PROTONIX) 40 MG tablet Take 40 mg by mouth daily. 05/22/15  Yes Historical Provider, MD  polyethylene glycol (MIRALAX / GLYCOLAX) packet Take 17 g by mouth daily as needed for mild constipation.   Yes Historical Provider, MD  potassium chloride (MICRO-K) 10 MEQ CR capsule Take 10 mEq by mouth daily as needed (leg cramps).    Yes Historical Provider, MD  propranolol (INDERAL) 10 MG tablet Take 10 mg by mouth daily. 04/25/15  Yes Historical Provider, MD  rosuvastatin (CRESTOR) 20 MG tablet Take 20 mg by mouth every morning.    Yes Historical Provider, MD  Sennosides (EX-LAX PO) Take 1 tablet by mouth daily as needed  (constipation).    Yes Historical Provider, MD  sucralfate (  CARAFATE) 1 G tablet Take 1-2 g by mouth daily.    Yes Historical Provider, MD  terconazole (TERAZOL 7) 0.4 % vaginal cream Place 1 applicator vaginally at bedtime.  04/18/14  Yes Historical Provider, MD  traMADol (ULTRAM) 50 MG tablet Take 1 tablet (50 mg total) by mouth every 12 (twelve) hours as needed for moderate pain. 03/27/15  Yes Bhavinkumar Bhagat, PA  valsartan (DIOVAN) 160 MG tablet Take 1 tablet (160 mg total) by mouth daily. Patient taking differently: Take 80 mg by mouth daily.  04/21/15  Yes Leonie Man, MD   BP 133/51 mmHg  Pulse 59  Temp(Src) 97.9 F (36.6 C) (Oral)  Resp 14  SpO2 99% Physical Exam  Constitutional: She is oriented to person, place, and time. She appears well-developed and well-nourished. No distress.  HENT:  Head: Normocephalic and atraumatic.  Eyes: Conjunctivae are normal. Pupils are equal, round, and reactive to light.  Neck: Neck supple.  Cardiovascular: Normal rate, regular rhythm, normal heart sounds and intact distal pulses.   Pulmonary/Chest: Breath sounds normal.  Somewhat increased work of breathing, but able to speak in full sentences without difficulty. No orthopnea.  Abdominal: Soft. There is no tenderness. There is no guarding.  Musculoskeletal: She exhibits edema. She exhibits no tenderness.  Bilateral lower leg edema. No unilateral swelling. No erythema or tenderness.  Lymphadenopathy:    She has no cervical adenopathy.  Neurological: She is alert and oriented to person, place, and time. She has normal reflexes.  No sensory deficits. Strength 5/5 in all extremities. No gait disturbance. Coordination intact.   Skin: Skin is warm and dry. She is not diaphoretic.  Psychiatric: She has a normal mood and affect. Her behavior is normal.  Nursing note and vitals reviewed.   ED Course  Procedures (including critical care time) Labs Review Labs Reviewed  CBC - Abnormal; Notable  for the following:    RBC 3.57 (*)    Hemoglobin 11.1 (*)    HCT 34.3 (*)    All other components within normal limits  D-DIMER, QUANTITATIVE (NOT AT [facility]) - Abnormal; Notable for the following:    D-Dimer, Quant 0.67 (*)    All other components within normal limits  COMPREHENSIVE METABOLIC PANEL - Abnormal; Notable for the following:    Potassium 3.4 (*)    Glucose, Bld 183 (*)    Creatinine, Ser 1.54 (*)    Albumin 3.3 (*)    ALT 10 (*)    GFR calc non Af Amer 32 (*)    GFR calc Af Amer 37 (*)    All other components within normal limits  URINALYSIS, ROUTINE W REFLEX MICROSCOPIC (NOT AT [facility]) - Abnormal; Notable for the following:    APPearance HAZY (*)    All other components within normal limits  URINE CULTURE  PROTIME-INR  APTT  BRAIN NATRIURETIC PEPTIDE  I-STAT TROPOININ, ED  I-STAT TROPOININ, ED   HEMOGLOBIN  Date Value Ref Range Status  06/12/2015 11.1* 12.0 - 15.0 g/dL Final  03/24/2015 11.0* 12.0 - 15.0 g/dL Final  03/24/2015 11.6* 12.0 - 15.0 g/dL Final  03/24/2015 10.7* 12.0 - 15.0 g/dL Final   BUN  Date Value Ref Range Status  06/12/2015 17 6 - 20 mg/dL Final  04/09/2015 18 7 - 25 mg/dL Final  03/25/2015 31* 6 - 20 mg/dL Final  03/24/2015 30* 6 - 20 mg/dL Final   CREAT  Date Value Ref Range Status  04/09/2015 1.64* 0.60 - 0.93 mg/dL Final  CREATININE, SER  Date Value Ref Range Status  06/12/2015 1.54* 0.44 - 1.00 mg/dL Final  03/25/2015 2.41* 0.44 - 1.00 mg/dL Final  03/24/2015 2.40* 0.44 - 1.00 mg/dL Final  03/24/2015 1.90* 0.44 - 1.00 mg/dL Final     Imaging Review Dg Chest 2 View  06/12/2015  CLINICAL DATA:  Chest and upper back pain for 2 days. EXAM: CHEST  2 VIEW COMPARISON:  03/25/2015 FINDINGS: Left pacer remains in place, unchanged. Mild cardiomegaly. Lungs are clear. No effusions or edema. No acute bony abnormality. Degenerative changes in the thoracic spine. IMPRESSION: No active cardiopulmonary disease. Electronically Signed   By: Rolm Baptise M.D.   On: 06/12/2015 07:24   Nm Pulmonary Perf And Vent  06/12/2015  CLINICAL DATA:  Chest pain, history of pulmonary embolism EXAM: NUCLEAR MEDICINE VENTILATION - PERFUSION LUNG SCAN TECHNIQUE: Ventilation images were obtained in multiple projections using inhaled aerosol Tc-47m DTPA. Perfusion images were obtained in multiple projections after intravenous injection of Tc-64m MAA. RADIOPHARMACEUTICALS:  31 MCi Technetium-44m DTPA aerosol inhalation and 4.2 mCi Technetium-59m MAA IV COMPARISON:  04/29/2014 and chest x-ray 06/12/2015 FINDINGS: Ventilation: No focal ventilation defect. Perfusion: No wedge shaped peripheral perfusion defects to suggest acute pulmonary embolism. Chest x-ray is unremarkable. IMPRESSION: Normal ventilation perfusion scan. Electronically Signed   By: Lahoma Crocker M.D.   On: 06/12/2015 13:15   I have personally reviewed and evaluated these images and lab results as part of my medical decision-making.   EKG Interpretation   Date/Time:  Friday Jun 12 2015 06:20:50 EDT Ventricular Rate:  74 PR Interval:  231 QRS Duration: 104 QT Interval:  421 QTC Calculation: 467 R Axis:   -5 Text Interpretation:  Sinus rhythm Prolonged PR interval Low voltage,  precordial leads Consider anterior infarct No significant change since  last tracing Confirmed by POLLINA  MD, CHRISTOPHER [phone]) on 06/12/2015  6:28:42 AM Also confirmed by Betsey Holiday  MD, CHRISTOPHER [phone]), editor  Gilford Rile, CCT, SANDRA ([id number])  on 06/12/2015 8:42:02 AM      Medications  technetium TC 19M diethylenetriame-pentaacetic acid (DTPA) injection 30 milli Curie (not administered)  morphine 4 MG/ML injection 4 mg (4 mg Intravenous Given 06/12/15 0927)  ondansetron (ZOFRAN) injection 4 mg (4 mg Intravenous Given 06/12/15 0927)  technetium albumin aggregated (MAA) injection solution 4 milli Curie (4 milli Curies Intravenous Contrast Given 06/12/15 1231)   Orders Placed This Encounter  Procedures  . Urine culture  . DG  Chest 2 View  . NM Pulmonary Perf and Vent  . CBC  . D-dimer, quantitative  . Protime-INR  . APTT  . Brain natriuretic peptide  . Comprehensive metabolic panel  . Urinalysis, Routine w reflex microscopic  . Diet NPO time specified  . Cardiac monitoring  . Check temperature  . Check Pulse Oximetry while ambulating  . Pulse oximetry, continuous  . I-stat troponin, ED  . I-Stat Troponin, ED (not at [facility])  . EKG 12-Lead  . ED EKG     MDM   Final diagnoses:  Shortness of breath  History of pulmonary embolism  Chest pain  Positive D-dimer    Ida Rogue presents with chest pain and shortness of breath that began last night.  Findings and plan of care discussed with Orpah Greek, MD. Dr. Betsey Holiday personally evaluated and examined this patient.  This patient's current complaint may be related to PE, especially with her history. Patient is nontoxic appearing, afebrile, not tachycardic, not tachypneic, and maintains SPO2 of 97-100% on room  air. HEART score is 5, indicating moderate risk for a cardiac event. Wells criteria score is 6, indicating moderate risk for PE. Pt had a cardiac cath in 2014, which was free from blockages. A nuclear medicine stress test and Echo both performed in October 2016 showed no significant abnormalities with low risk and EF of 60%. Low suspicion for aortic dissection due to patient presentation. CTA PE study unable to be performed due to an allergy to IV contrast. Patient's CBC and CMP abnormalities are consistent with previous values. Specifically, the hemoglobin and creatinine are consistent with previous. Patient's d-dimer is positive, giving indication for a VQ scan. Second troponin draw delayed due to patient in VQ scan.Upon reassessment, patient states that her pain has been completely relieved. Pt ambulated without difficulty and without significant drop in SPO2 or rise in pulse. Strict return precautions discussed. Patient voiced understanding  of these instructions, agreed with the plan, and is comfortable with discharge. Patient remains pain-free upon discharge. No recurrence of shortness of breath.   Filed Vitals:   06/12/15 0630 06/12/15 0645 06/12/15 0700 06/12/15 0735  BP: 148/63 160/68 161/56 158/56  Pulse: 67 64 60 61  Resp: 19 18 22 20   SpO2: 97% 98% 97% 100%   Filed Vitals:   06/12/15 1145 06/12/15 1300 06/12/15 1300 06/12/15 1315  BP: 127/55 128/60 128/60 140/51  Pulse: 59  64 59  Temp:      TempSrc:      Resp: 19  16 15   SpO2: 99%  100% 97%   Filed Vitals:   06/12/15 1300 06/12/15 1300 06/12/15 1315 06/12/15 1330  BP: 128/60 128/60 140/51 133/51  Pulse:  64 59 59  Temp:      TempSrc:      Resp:  16 15 14   SpO2:  100% 97% 99%       Lorayne Bender, PA-C 06/12/15 [place], MD 06/15/15 [phone]

## 2015-06-12 NOTE — ED Notes (Signed)
Pt from home with c/o chest pain and shortness of breath. States the pain began about 0400 this am. Pt states she has a hx of PE, and has a pacemaker.

## 2015-06-12 NOTE — ED Notes (Signed)
Pt returns from vascular.  

## 2015-06-12 NOTE — ED Notes (Signed)
Pt ambulated in hallway with walker, no issues. Spo2 96% and HR 78

## 2015-06-13 LAB — URINE CULTURE

## 2015-06-15 ENCOUNTER — Ambulatory Visit (INDEPENDENT_AMBULATORY_CARE_PROVIDER_SITE_OTHER): Payer: Medicare HMO | Admitting: Cardiology

## 2015-06-15 ENCOUNTER — Ambulatory Visit: Payer: Medicare HMO | Admitting: Cardiology

## 2015-06-15 ENCOUNTER — Encounter: Payer: Self-pay | Admitting: Cardiology

## 2015-06-15 VITALS — BP 140/66 | HR 60 | Ht 67.0 in | Wt 291.0 lb

## 2015-06-15 DIAGNOSIS — I519 Heart disease, unspecified: Secondary | ICD-10-CM | POA: Diagnosis not present

## 2015-06-15 DIAGNOSIS — R600 Localized edema: Secondary | ICD-10-CM

## 2015-06-15 DIAGNOSIS — IMO0001 Reserved for inherently not codable concepts without codable children: Secondary | ICD-10-CM

## 2015-06-15 DIAGNOSIS — Z0389 Encounter for observation for other suspected diseases and conditions ruled out: Secondary | ICD-10-CM

## 2015-06-15 DIAGNOSIS — E785 Hyperlipidemia, unspecified: Secondary | ICD-10-CM

## 2015-06-15 DIAGNOSIS — R0781 Pleurodynia: Secondary | ICD-10-CM

## 2015-06-15 DIAGNOSIS — Z86718 Personal history of other venous thrombosis and embolism: Secondary | ICD-10-CM

## 2015-06-15 DIAGNOSIS — I10 Essential (primary) hypertension: Secondary | ICD-10-CM

## 2015-06-15 MED ORDER — ROSUVASTATIN CALCIUM 20 MG PO TABS: 20.0000 mg | ORAL_TABLET | Freq: Every morning | ORAL | Status: DC

## 2015-06-15 NOTE — Progress Notes (Signed)
PCP: [name], MD  Clinic Note: Chief Complaint  Patient presents with  . Follow-up    Hospital, shortness of breath, chest pain, pos D-dimer, swelling in legs  . Leg Swelling    HPI: Tara Jackson is a 75 y.o. female with a PMH below who presents today for Post hospital follow-up.Tara Jackson was last seen on 04/21/2015. At that time she was happy with her pacemaker and was admitted much baseline. Mild edema but controlled wearing support hose. Was otherwise doing relatively well.  Recent Hospitalizations: She went to the emergency room on May 5 with complaint of back pain radiating to the chest. She did note a little shortness of breath. The symptoms Started in the morning, were not necessarily associated with any particular activity. She was evaluated for PE with a d-dimer which was mildly elevated. Therefore she had a VQ scan and lower extremity venous Dopplers both of which were negative for thrombus. Justify the, cardiac etiology was less high on the differential because of her relatively significant negative ischemic cardiac evaluation in the past.  Studies Reviewed:  06/12/2015 VQ Scan: No evidence of PE  06/14/2015 Lower Extremity Venous Dopplers: No DVT  Interval History: [name] returns today he was feeling better. She is not had any more the discomfort in her back or chest problems since the hospital stay. She has been on chronic home oxygen now for a while. She says her leg swelling is a little of an issue but has not had to take any extra Lasix recently. Serna about risk for clot because she does not walk around much. She really is limited as far as activity goes by her spinal stenosis. And therefore is not really exerting herself extent that she would potentially notice any resting or exertional dyspnea or chest pain. She denies any PND or orthopnea, but has intermittent mild edema. She is not noting rapid irregular heartbeats, and with the pacemaker she does  not have any bradycardia spells. Therefore she is now on having the dizziness episodes. She said that on the morning that she presented emergency room, that her right leg was more swollen than the left, there does appear to be some mild discrepancy, but the lower extremity venous Doppler did not show any evidence of DVT.   No palpitations, lightheadedness, dizziness, weakness or syncope/near syncope. No TIA/amaurosis fugax symptoms. No melena, hematochezia, hematuria, or epstaxis. No claudication.  ROS: A comprehensive was performed. Review of Systems  HENT: Negative for nosebleeds.   Respiratory: Positive for shortness of breath (With any significant exertion).   Gastrointestinal: Positive for heartburn. Negative for abdominal pain, blood in stool and melena.  Genitourinary: Negative for hematuria.  Musculoskeletal: Positive for myalgias (Fibromyalgia pain), back pain and joint pain. Negative for falls.  Neurological: Positive for weakness (Global weakness from her spinal stenosis). Negative for headaches.  Endo/Heme/Allergies: Does not bruise/bleed easily.  Psychiatric/Behavioral: Negative for depression. The patient is nervous/anxious. The patient does not have insomnia.   All other systems reviewed and are negative.   Past Medical History  Diagnosis Date  . DVT (deep venous thrombosis) ([place])     "18 in RLE; 7 LLE prior to PE" (03/24/2015)  . Asthma   . Hypothyroidism   . Anxiety   . Depression   . H/O hiatal hernia   . Arthritis   . Anemia   . 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 Hyperlipidemia   . Diabetes mellitus type 2, insulin dependent ([place])   . Hypertension associated with diabetes ([place])   . Glaucoma   . Cataract   . Diabetic neuropathy ([place])   . Palpitations 35years ago    Cardionet monitor - revealed mostly normal sinus rhythm, sinus bradycardia with  first-degree A-V block heart rates mostly in the 50s and 60s with some 70s. No arrhythmias, PVCs or PACs noted.  Marland Kitchen TIA (transient ischemic attack) March 2014  . Diastolic dysfunction, left ventricle 11/30/14    EF 55%, grade 1 DD  . Stroke [facility]) 04-19-2013    tia and stroke. Weakness Rt hand  . Sleep apnea     cpap disontinued; test done 07/14/2008 ordered per  [place]  . Restless legs syndrome   . Spinal stenosis     L 3, L 4 and L 5, and C 1, C 2 and C3  . Complication of anesthesia     hard to wake up after anesthesia, trouble turning head  . Spinal headache   . Memory loss 06/03/2014  . Osteoporosis   . Normal cardiac stress test 11/30/14    Negative Myoview  . Presence of permanent cardiac pacemaker   . Kidney stones     "passed them"  . On home oxygen therapy     "2L oxygen concentrator @ night"  . Symptomatic bradycardia 03/24/15    [employer] PPM, Dr. Lovena Le  . Brittle bone disease     Past Surgical History  Procedure Laterality Date  . Abdominal hysterectomy    . Cholecystectomy    . Back surgery      steroid inj  . Tonsillectomy    . Appendectomy    . Esophagogastroduodenoscopy  08/09/2011    Procedure: ESOPHAGOGASTRODUODENOSCOPY (EGD);  Surgeon: Jeryl Columbia, MD;  Location: [facility];  Service: Endoscopy;  Laterality: N/A;  . Hot hemostasis  08/09/2011    Procedure: HOT HEMOSTASIS (ARGON PLASMA COAGULATION/BICAP);  Surgeon: Jeryl Columbia, MD;  Location: [facility];  Service: Endoscopy;  Laterality: N/A;  . Trigger finger release  11/22/2011    Procedure: RELEASE TRIGGER FINGER/A-1 PULLEY;  Surgeon: Meredith Pel, MD;  Location: [facility];  Service: Orthopedics;  Laterality: Left;  Left trigger thumb release  . Cardiac catheterization  02/14/2012    no evidence of obstructive coronary disease ,low EDP with normal EF  . Doppler echocardiography  2014; October 2016    a. EF 0000000; normal diastolic pressures, no regional wall motion motion abnormalities;; b/ F  55-60%. Normal wall motion. GR 1 DD. No valve lesions  . Nm myocar perf wall motion  01/26/2012; 11/2014    a. EF 79%,LV normal,ST SEGMENT CHANGE SUGGESTIVE OF ISCHEMIA.; LOW RISK, EF 60%. No ischemia or infarction. No wall motion abnormality   . Lev doppler  05/29/2012    RIGHT EXTREM. NORMAL VENOUS DUPLEX  . Joint replacement    . Knee surgery Left done with 2nd left knee replacement    spacer bar placement  . Cataract extraction w/ intraocular lens  implant, bilateral Bilateral 2000s  . Trigger finger release Right yrs ago  . Esophagogastroduodenoscopy (egd) with propofol N/A 11/12/2013    Procedure: ESOPHAGOGASTRODUODENOSCOPY (EGD) WITH PROPOFOL;  Surgeon: Jeryl Columbia, MD;  Location: [facility];  Service: Endoscopy;  Laterality: N/A;  . Hot hemostasis N/A 11/12/2013    Procedure: HOT HEMOSTASIS (ARGON PLASMA COAGULATION/BICAP);  Surgeon: Caryl Bis  Magod, MD;  Location: [facility] ENDOSCOPY;  Service: Endoscopy;  Laterality: N/A;  . Left heart catheterization with coronary angiogram N/A 02/14/2012    Procedure: LEFT HEART CATHETERIZATION WITH CORONARY ANGIOGRAM;  Surgeon: Leonie Man, MD;  Location: [facility];  Service: Cardiovascular;  Laterality: N/A;  . Insert / replace / remove pacemaker  03/24/2015  . Total knee arthroplasty Bilateral   . Revision total knee arthroplasty Left   . Glaucoma surgery Bilateral 2000s    "laser"  . Ep implantable device N/A 03/24/2015    [employer] PPM, Dr. Lovena Le    Prior to Admission medications   Medication Sig Start Date End Date Taking? Authorizing Provider  acetaminophen (TYLENOL) 500 MG tablet Take 500 mg by mouth every 6 (six) hours as needed.    Historical Provider, MD  albuterol (PROVENTIL HFA;VENTOLIN HFA) 108 (90 BASE) MCG/ACT inhaler Inhale 2 puffs into the lungs every 6 (six) hours as needed for wheezing.    Historical Provider, MD  aspirin 325 MG tablet Take 325 mg by mouth once.    Historical Provider, MD  busPIRone (BUSPAR) 10 MG  tablet Take 1 tablet (10 mg total) by mouth every morning. 06/03/14   Garvin Fila, MD  Cholecalciferol (VITAMIN D-3 PO) Take 1 tablet by mouth every morning.     Historical Provider, MD  clonazePAM (KLONOPIN) 0.5 MG tablet Take 0.5-1 mg by mouth 2 (two) times daily. Reported on 03/24/2015    Historical Provider, MD  clopidogrel (PLAVIX) 75 MG tablet Take 75 mg by mouth every morning.    Historical Provider, MD  diclofenac sodium (VOLTAREN) 1 % GEL Apply 2 g topically 3 (three) times daily as needed (to affected area). 11/30/14   Bonnielee Haff, MD  dicyclomine (BENTYL) 10 MG capsule Take 10 mg by mouth 3 (three) times daily as needed for spasms.     Historical Provider, MD  esomeprazole (NEXIUM) 40 MG capsule Take 40 mg by mouth daily before breakfast.    Historical Provider, MD  furosemide (LASIX) 40 MG tablet Take 40 mg by mouth daily.     Historical Provider, MD  glipiZIDE (GLUCOTROL) 10 MG tablet Take 20 mg by mouth 2 (two) times daily before a meal.  05/03/14   Historical Provider, MD  insulin NPH (HUMULIN N,NOVOLIN N) 100 UNIT/ML injection Inject 30 Units into the skin daily before breakfast. Novolin-N    Historical Provider, MD  insulin regular (NOVOLIN R,HUMULIN R) 100 units/mL injection Inject 20 Units into the skin 3 (three) times daily before meals.    Historical Provider, MD  levothyroxine (SYNTHROID, LEVOTHROID) 100 MCG tablet Take 100 mcg by mouth every morning.     Historical Provider, MD  lidocaine (LIDODERM) 5 % Place 3 patches onto the skin daily. Remove & Discard patch within 12 hours or as directed by MD    Historical Provider, MD  Linaclotide (LINZESS) 290 MCG CAPS capsule Take 290 mcg by mouth every morning.     Historical Provider, MD  LORazepam (ATIVAN) 0.5 MG tablet Take 0.5 mg by mouth daily as needed for anxiety.    Historical Provider, MD  meclizine (ANTIVERT) 12.5 MG tablet Take 12.5 mg by mouth 3 (three) times daily as needed for dizziness.     Historical Provider, MD    nortriptyline (PAMELOR) 50 MG capsule Take 2 capsules (100 mg total) by mouth at bedtime. 04/07/15   Garvin Fila, MD  pantoprazole (PROTONIX) 40 MG tablet Take 40 mg by mouth daily. 05/22/15  Historical Provider, MD  polyethylene glycol (MIRALAX / GLYCOLAX) packet Take 17 g by mouth daily as needed for mild constipation.    Historical Provider, MD  potassium chloride (MICRO-K) 10 MEQ CR capsule Take 10 mEq by mouth daily as needed (leg cramps).     Historical Provider, MD  propranolol (INDERAL) 10 MG tablet Take 10 mg by mouth daily. 04/25/15   Historical Provider, MD  rosuvastatin (CRESTOR) 20 MG tablet Take 20 mg by mouth every morning.     Historical Provider, MD  Sennosides (EX-LAX PO) Take 1 tablet by mouth daily as needed (constipation).     Historical Provider, MD  sucralfate (CARAFATE) 1 G tablet Take 1-2 g by mouth daily.     Historical Provider, MD  terconazole (TERAZOL 7) 0.4 % vaginal cream Place 1 applicator vaginally at bedtime.  04/18/14   Historical Provider, MD  traMADol (ULTRAM) 50 MG tablet Take 1 tablet (50 mg total) by mouth every 12 (twelve) hours as needed for moderate pain. 03/27/15   Bhavinkumar Bhagat, PA  valsartan (DIOVAN) 160 MG tablet Take 1 tablet (160 mg total) by mouth daily. Patient taking differently: Take 80 mg by mouth daily.  04/21/15   Leonie Man, MD   Allergies  Allergen Reactions  . Iodine Other (See Comments)    ONLY IV--Makes unconscious  . Metoclopramide Hcl Other (See Comments)    suicidal  . Sulfa Antibiotics Hives  . Iohexol      Code: SOB, Desc: cardiac arrest w/ iv contrast, has never used 13 hr prep//alice calhoun, Onset Date: [id number]   . Lactose Intolerance (Gi) Diarrhea  . Lansoprazole Nausea And Vomiting    Social History   Social History  . Marital Status: Married    Spouse Name: [name]  . Number of Children: 2  . Years of Education: college   Occupational History  . retired    Social History Main Topics  . Smoking  status: Never Smoker   . Smokeless tobacco: Never Used  . Alcohol Use: No  . Drug Use: No  . Sexual Activity: Yes   Other Topics Concern  . None   Social History Narrative   Married [name].) married 66 years.   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       Family History  Problem Relation Age of Onset  . Cancer Mother   . Cancer - Prostate Father     Wt Readings from Last 3 Encounters:  06/15/15 291 lb (131.997 kg)  04/21/15 291 lb 6 oz (132.167 kg)  04/07/15 290 lb (131.543 kg)    PHYSICAL EXAM BP 140/66 mmHg  Pulse 60  Ht 5\' 7"  (1.702 m)  Wt 291 lb (131.997 kg)  BMI 45.57 kg/m2 General appearance: alert, cooperative, appears stated age, no distress, morbidly obese and mostly truncal obesity. Pleasant mood and affect. Well groomed. Seems to be in better spirits than last visit. She is sitting in a wheelchair. Neck: no carotid bruit and no JVD  Lungs: clear to auscultation bilaterallleg sy, normal percussion bilaterally and nonlabored, good air movement.  Heart: regular rate and rhythm, S1, S2 normal, no murmur, click, rub or gallop and normal apical impulse  Abdomen: soft, non-tender; bowel sounds normal; no masses, no organomegaly and truncal obesity noted.  Extremities: extremities normal, atraumatic, no cyanosis; trace lower ankle only edema. Not really true bulimia. Pulses: 2+ and symmetric  Neurologic: Grossly normal    Adult ECG Report  Rate: 60 ;  Rhythm: Atrial paced rhythm with prolonged AV conduction. Poor anterior R-wave progression. Otherwise normal axis, intervals and durations;   Narrative Interpretation: Relatively stable EKG   Other studies Reviewed: Additional studies/ records that were reviewed today include:  Recent Labs:  Lab Results  Component Value Date   CREATININE 1.54* 06/12/2015  BNP = 47 in ER   ASSESSMENT / PLAN: Arthritic presents today for follow-up of chest pain for the emergency  visit. I concur with the ER physician's assessment that she has had a pretty extensive cardiac evaluation in the symptoms did not sound cardiac in nature. She ruled out for PE and DVT. Therefore I don't think we need to consider converting an echo regulation. We talked about mechanisms to try to avoid thromboembolism or thrombus formation. I don't think any further cardiac evaluation is warranted.  Problem List Items Addressed This Visit    PULMONARY EMBOLISM, HX OF    Distant history of PE DVT had been on Coumadin for a long time. Was then switched to Plavix. She does have chronic lower TIMI edema with some stasis changes. No evidence of DVT. Recommendations to prevent further venous thromboembolism would be to continue Exercising to help mobilize blood from the lower extremities while she is sitting. Also need to wear support stockings during the day.      Pleuritic chest pain    I think the symptoms she noted this time probably is more consistent either with pleuritic pain or from her back radiating to the front from a neuropathic standpoint. It sounded more sharp radiating from back to front. She's had a pretty significant cardiac evaluation and I agree that I don't think this is cardiac in nature. She's not having any more symptoms now.      Normal coronary arteries    She has a history of an abnormal nuclear stress test with normal coronaries. With that in mind, I don't think that a Myoview makes a lot of sense. I don't think her symptoms are cardiac anyway. Certainly the symptoms don't warrant considering cardiac catheterization      Hyperlipidemia with target LDL less than 130 (Chronic)    Labs are followed by PCP. She is on Crestor which we will refill.      Relevant Medications   rosuvastatin (CRESTOR) 20 MG tablet   Essential hypertension - Primary (Chronic)    Blood pressure is low but up today. But not significantly. She has a history of hypotension, therefore I'm reluctant to  push too hard. Aspirin continue current medicines.      Relevant Medications   rosuvastatin (CRESTOR) 20 MG tablet   Other Relevant Orders   EKG 12-Lead   Edema of both legs    CONTINUE TO DO LEG EXERCISE - TO PREVENT ANY BLOOD CLOTS IN YOUR LEGS EXERCISE LEGS AND THIGHS- WEAR SUPPORT  STOCKING DURING THE DAY  USE YOUR FUROSEMIDE ( LASIX) AS NEEDED FOR SWELLING      Relevant Orders   EKG [id number]   Diastolic dysfunction, left ventricle (Chronic)    Normal function with grade 1 diastolic dysfunction. She is on ARB for afterload reduction. She is also on a standing dose of Lasix when necessary.      Relevant Medications   rosuvastatin (CRESTOR) 20 MG tablet   Other Relevant Orders   EKG 12-Lead      Current medicines are reviewed at length with the patient today. (+/- concerns) Worried about developing another clot. The following changes have  been made:   CONTINUE TO DO LEG EXERCISE - TO PREVENT ANY BLOOD CLOTS IN YOUR LEGS EXERCISE LEGS AND THIGHS- WEAR SUPPORT  STOCKING DURING THE DAY  NO CHANGE WITH CURRENT MEDICATIONS  USE YOUR FUROSEMIDE ( LASIX) AS NEEDED FOR SWELLING  Your physician wants you to follow in Nov 2017 with DR Bertran Zeimet --30 MIN.  Studies Ordered:   Orders Placed This Encounter  Procedures  . EKG 12-Lead      Leonie Man, M.D., M.S. Interventional Cardiologist   Pager # [phone] Phone # [phone] [place], [place] [address]

## 2015-06-15 NOTE — Patient Instructions (Signed)
CONTINUE TO DO LEG EXERCISE - TO PREVENT ANY BLOOD CLOTS IN YOUR LEGS EXERCISE LEGS AND THIGHS- WEAR SUPPORT  STOCKING DURING THE DAY  NO CHANGE WITH CURRENT MEDICATIONS  USE YOUR FUROSEMIDE ( LASIX) AS NEEDED FOR SWELLING  Your physician wants you to follow in Nov 2017 with DR HARDING --30 MIN.  You will receive a reminder letter in the mail two months in advance. If you don't receive a letter, please call our office to schedule the follow-up appointment.  If you need a refill on your cardiac medications before your next appointment, please call your pharmacy.

## 2015-06-16 ENCOUNTER — Encounter: Payer: Self-pay | Admitting: Cardiology

## 2015-06-16 DIAGNOSIS — I129 Hypertensive chronic kidney disease with stage 1 through stage 4 chronic kidney disease, or unspecified chronic kidney disease: Secondary | ICD-10-CM | POA: Diagnosis not present

## 2015-06-16 DIAGNOSIS — Z794 Long term (current) use of insulin: Secondary | ICD-10-CM | POA: Diagnosis not present

## 2015-06-16 DIAGNOSIS — N183 Chronic kidney disease, stage 3 (moderate): Secondary | ICD-10-CM | POA: Diagnosis not present

## 2015-06-16 DIAGNOSIS — R269 Unspecified abnormalities of gait and mobility: Secondary | ICD-10-CM | POA: Diagnosis not present

## 2015-06-16 DIAGNOSIS — Z950 Presence of cardiac pacemaker: Secondary | ICD-10-CM | POA: Diagnosis not present

## 2015-06-16 DIAGNOSIS — M545 Low back pain: Secondary | ICD-10-CM | POA: Diagnosis not present

## 2015-06-16 DIAGNOSIS — E1149 Type 2 diabetes mellitus with other diabetic neurological complication: Secondary | ICD-10-CM | POA: Diagnosis not present

## 2015-06-16 DIAGNOSIS — D6489 Other specified anemias: Secondary | ICD-10-CM | POA: Diagnosis not present

## 2015-06-16 DIAGNOSIS — Z6841 Body Mass Index (BMI) 40.0 and over, adult: Secondary | ICD-10-CM | POA: Diagnosis not present

## 2015-06-16 NOTE — Assessment & Plan Note (Signed)
Normal function with grade 1 diastolic dysfunction. She is on ARB for afterload reduction. She is also on a standing dose of Lasix when necessary.

## 2015-06-16 NOTE — Assessment & Plan Note (Signed)
CONTINUE TO DO LEG EXERCISE - TO PREVENT ANY BLOOD CLOTS IN YOUR LEGS EXERCISE LEGS AND THIGHS- WEAR SUPPORT  STOCKING DURING THE DAY  USE YOUR FUROSEMIDE ( LASIX) AS NEEDED FOR SWELLING

## 2015-06-16 NOTE — Assessment & Plan Note (Signed)
Blood pressure is low but up today. But not significantly. She has a history of hypotension, therefore I'm reluctant to push too hard. Aspirin continue current medicines.

## 2015-06-16 NOTE — Assessment & Plan Note (Signed)
Distant history of PE DVT had been on Coumadin for a long time. Was then switched to Plavix. She does have chronic lower TIMI edema with some stasis changes. No evidence of DVT. Recommendations to prevent further venous thromboembolism would be to continue Exercising to help mobilize blood from the lower extremities while she is sitting. Also need to wear support stockings during the day.

## 2015-06-16 NOTE — Assessment & Plan Note (Signed)
I think the symptoms she noted this time probably is more consistent either with pleuritic pain or from her back radiating to the front from a neuropathic standpoint. It sounded more sharp radiating from back to front. She's had a pretty significant cardiac evaluation and I agree that I don't think this is cardiac in nature. She's not having any more symptoms now.

## 2015-06-16 NOTE — Assessment & Plan Note (Signed)
Labs are followed by PCP. She is on Crestor which we will refill.

## 2015-06-16 NOTE — Assessment & Plan Note (Signed)
She has a history of an abnormal nuclear stress test with normal coronaries. With that in mind, I don't think that a Myoview makes a lot of sense. I don't think her symptoms are cardiac anyway. Certainly the symptoms don't warrant considering cardiac catheterization

## 2015-06-19 ENCOUNTER — Ambulatory Visit (INDEPENDENT_AMBULATORY_CARE_PROVIDER_SITE_OTHER): Payer: Medicare HMO | Admitting: Internal Medicine

## 2015-06-19 ENCOUNTER — Encounter: Payer: Self-pay | Admitting: Internal Medicine

## 2015-06-19 VITALS — BP 146/68 | HR 72 | Ht 67.5 in | Wt 289.6 lb

## 2015-06-19 DIAGNOSIS — I495 Sick sinus syndrome: Secondary | ICD-10-CM | POA: Diagnosis not present

## 2015-06-19 DIAGNOSIS — I119 Hypertensive heart disease without heart failure: Secondary | ICD-10-CM

## 2015-06-19 LAB — CUP PACEART INCLINIC DEVICE CHECK
Date Time Interrogation Session: 20170512040000
Implantable Lead Implant Date: 20170214
Implantable Lead Implant Date: 20170214
Implantable Lead Location: 753859
Implantable Lead Location: 753860
Implantable Lead Model: 7740
Implantable Lead Model: 7741
Implantable Lead Serial Number: 649859
Implantable Lead Serial Number: 728741
Lead Channel Impedance Value: 774 Ohm
Lead Channel Impedance Value: 872 Ohm
Lead Channel Pacing Threshold Amplitude: 0.7 V
Lead Channel Pacing Threshold Amplitude: 1 V
Lead Channel Pacing Threshold Pulse Width: 0.4 ms
Lead Channel Pacing Threshold Pulse Width: 0.4 ms
Lead Channel Sensing Intrinsic Amplitude: 25 mV
Lead Channel Sensing Intrinsic Amplitude: 3.8 mV
Lead Channel Setting Pacing Amplitude: 1.4 V
Lead Channel Setting Pacing Amplitude: 2 V
Lead Channel Setting Pacing Pulse Width: 0.4 ms
Lead Channel Setting Sensing Sensitivity: 2.5 mV
Pulse Gen Serial Number: 718098

## 2015-06-19 NOTE — Patient Instructions (Signed)
Medication Instructions:  Your physician recommends that you continue on your current medications as directed. Please refer to the Current Medication list given to you today.   Labwork: None ordered   Testing/Procedures: None ordered   Follow-Up: Your physician wants you to follow-up in: 9 months with Dr Knox Saliva will receive a reminder letter in the mail two months in advance. If you don't receive a letter, please call our office to schedule the follow-up appointment.   Any Other Special Instructions Will Be Listed Below (If Applicable).     If you need a refill on your cardiac medications before your next appointment, please call your pharmacy.

## 2015-06-19 NOTE — Progress Notes (Deleted)
    Patient Name: Tara Jackson Date of Encounter: 06/19/2015     Active Problems:   * No active hospital problems. *    SUBJECTIVE  ***  CURRENT MEDS   OBJECTIVE  Filed Vitals:   06/19/15 1352  BP: 146/68  Pulse: 72  Height: 5' 7.5" (1.715 m)  Weight: 289 lb 9.6 oz (131.362 kg)  SpO2: 94%   @IOBRIEF @ [employer]   06/19/15 1352  Weight: 289 lb 9.6 oz (131.362 kg)    PHYSICAL EXAM  General: Pleasant, NAD. Neuro: Alert and oriented X 3. Moves all extremities spontaneously. Psych: Normal affect. HEENT:  Normal  Neck: Supple without bruits or JVD. Lungs:  Resp regular and unlabored, CTA. Heart: RRR no s3, s4, or murmurs. Abdomen: Soft, non-tender, non-distended, BS + x 4.  Extremities: No clubbing, cyanosis or edema. DP/PT/Radials 2+ and equal bilaterally.  Accessory Clinical Findings  CBC No results for input(s): WBC, NEUTROABS, HGB, HCT, MCV, PLT in the last 72 hours. Basic Metabolic Panel No results for input(s): NA, K, CL, CO2, GLUCOSE, BUN, CREATININE, CALCIUM, MG, PHOS in the last 72 hours. Liver Function Tests No results for input(s): AST, ALT, ALKPHOS, BILITOT, PROT, ALBUMIN in the last 72 hours. No results for input(s): LIPASE, AMYLASE in the last 72 hours. Cardiac Enzymes No results for input(s): CKTOTAL, CKMB, CKMBINDEX, TROPONINI in the last 72 hours. BNP Invalid input(s): POCBNP D-Dimer No results for input(s): DDIMER in the last 72 hours. Hemoglobin A1C No results for input(s): HGBA1C in the last 72 hours. Fasting Lipid Panel No results for input(s): CHOL, HDL, LDLCALC, TRIG, CHOLHDL, LDLDIRECT in the last 72 hours. Thyroid Function Tests No results for input(s): TSH, T4TOTAL, T3FREE, THYROIDAB in the last 72 hours.  Invalid input(s): FREET3  TELE  ***  ECG  ***  Radiology/Studies  Dg Chest 2 View  06/12/2015  CLINICAL DATA:  Chest and upper back pain for 2 days. EXAM: CHEST  2 VIEW COMPARISON:  03/25/2015 FINDINGS: Left  pacer remains in place, unchanged. Mild cardiomegaly. Lungs are clear. No effusions or edema. No acute bony abnormality. Degenerative changes in the thoracic spine. IMPRESSION: No active cardiopulmonary disease. Electronically Signed   By: Rolm Baptise M.D.   On: 06/12/2015 07:24   Nm Pulmonary Perf And Vent  06/12/2015  CLINICAL DATA:  Chest pain, history of pulmonary embolism EXAM: NUCLEAR MEDICINE VENTILATION - PERFUSION LUNG SCAN TECHNIQUE: Ventilation images were obtained in multiple projections using inhaled aerosol Tc-71m DTPA. Perfusion images were obtained in multiple projections after intravenous injection of Tc-70m MAA. RADIOPHARMACEUTICALS:  31 MCi Technetium-34m DTPA aerosol inhalation and 4.2 mCi Technetium-48m MAA IV COMPARISON:  04/29/2014 and chest x-ray 06/12/2015 FINDINGS: Ventilation: No focal ventilation defect. Perfusion: No wedge shaped peripheral perfusion defects to suggest acute pulmonary embolism. Chest x-ray is unremarkable. IMPRESSION: Normal ventilation perfusion scan. Electronically Signed   By: Lahoma Crocker M.D.   On: 06/12/2015 13:15    ASSESSMENT AND PLAN  ***  Jumaane Weatherford,M.D.  06/19/2015 2:03 PM

## 2015-06-19 NOTE — Progress Notes (Signed)
Patient ID: Tara Jackson, female   DOB: 12-04-1940, 75 y.o.   MRN: [id number]      HPI Tara Jackson returns today for followup. Tara Jackson is a pleasant 75 yo woman with sinus node dysfunction, s/p PPM insertion, who has been stable in the interim. 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 Tara Jackson denies chest pain or sob. Allergies  Allergen Reactions  . Iodine Other (See Comments)    ONLY IV--Makes unconscious  . Metoclopramide Hcl Other (See Comments)    suicidal  . Sulfa Antibiotics Hives  . Iohexol      Code: SOB, Desc: cardiac arrest w/ iv contrast, has never used 13 hr prep//alice calhoun, Onset Date: [id number]   . Lactose Intolerance (Gi) Diarrhea  . Lansoprazole Nausea And Vomiting     Current Outpatient Prescriptions  Medication Sig Dispense Refill  . acetaminophen (TYLENOL) 500 MG tablet Take 500 mg by mouth every 6 (six) hours as needed.    Marland Kitchen albuterol (PROVENTIL HFA;VENTOLIN HFA) 108 (90 BASE) MCG/ACT inhaler Inhale 2 puffs into the lungs every 6 (six) hours as needed for wheezing.    Marland Kitchen aspirin 325 MG tablet Take 325 mg by mouth once.    . busPIRone (BUSPAR) 10 MG tablet Take 1 tablet (10 mg total) by mouth every morning. 60 tablet 1  . Cholecalciferol (VITAMIN D-3 PO) Take 1 tablet by mouth every morning.     . clonazePAM (KLONOPIN) 0.5 MG tablet Take 0.5-1 mg by mouth 2 (two) times daily. Reported on 03/24/2015    . clopidogrel (PLAVIX) 75 MG tablet Take 75 mg by mouth every morning.    . diclofenac sodium (VOLTAREN) 1 % GEL Apply 2 g topically 3 (three) times daily as needed (to affected area). 1 Tube 0  . dicyclomine (BENTYL) 10 MG capsule Take 10 mg by mouth 3 (three) times daily as needed for spasms.     Marland Kitchen esomeprazole (NEXIUM) 40 MG capsule Take 40 mg by mouth daily before breakfast.    . furosemide (LASIX) 40 MG tablet Take 40 mg by mouth daily.     Marland Kitchen glipiZIDE (GLUCOTROL) 10 MG tablet Take 20 mg by mouth 2 (two) times daily before a meal.     . insulin  NPH (HUMULIN N,NOVOLIN N) 100 UNIT/ML injection Inject 30 Units into the skin daily before breakfast. Novolin-N    . insulin regular (NOVOLIN R,HUMULIN R) 100 units/mL injection Inject 20 Units into the skin 3 (three) times daily before meals.    Marland Kitchen levothyroxine (SYNTHROID, LEVOTHROID) 100 MCG tablet Take 100 mcg by mouth every morning.     . lidocaine (LIDODERM) 5 % Place 3 patches onto the skin daily. Remove & Discard patch within 12 hours or as directed by MD    . Linaclotide (LINZESS) 290 MCG CAPS capsule Take 290 mcg by mouth every morning.     Marland Kitchen LORazepam (ATIVAN) 0.5 MG tablet Take 0.5 mg by mouth daily as needed for anxiety.    . meclizine (ANTIVERT) 12.5 MG tablet Take 12.5 mg by mouth 3 (three) times daily as needed for dizziness.     . nortriptyline (PAMELOR) 50 MG capsule Take 2 capsules (100 mg total) by mouth at bedtime. 60 capsule 3  . pantoprazole (PROTONIX) 40 MG tablet Take 40 mg by mouth daily.    . polyethylene glycol (MIRALAX / GLYCOLAX) packet Take 17 g by mouth daily as needed for mild constipation.    . potassium chloride (MICRO-K) 10 MEQ  CR capsule Take 10 mEq by mouth daily as needed (leg cramps).     . propranolol (INDERAL) 10 MG tablet Take 10 mg by mouth daily.  3  . rosuvastatin (CRESTOR) 20 MG tablet Take 1 tablet (20 mg total) by mouth every morning. 90 tablet 3  . Sennosides (EX-LAX PO) Take 1 tablet by mouth daily as needed (constipation).     . sucralfate (CARAFATE) 1 G tablet Take 1-2 g by mouth daily.     Marland Kitchen terconazole (TERAZOL 7) 0.4 % vaginal cream Place 1 applicator vaginally at bedtime.     . traMADol (ULTRAM) 50 MG tablet Take 1 tablet (50 mg total) by mouth every 12 (twelve) hours as needed for moderate pain. 30 tablet 0  . valsartan (DIOVAN) 160 MG tablet Take 80 mg by mouth daily.     No current facility-administered medications for this visit.     Past Medical History  Diagnosis Date  . DVT (deep venous thrombosis) ([place])     "18 in RLE; 7 LLE  prior to PE" (03/24/2015)  . Asthma   . Hypothyroidism   . Anxiety   . Depression   . H/O hiatal hernia   . Arthritis   . Anemia   . 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 Hyperlipidemia   . Diabetes mellitus type 2, insulin dependent ([place])   . Hypertension associated with diabetes ([place])   . Glaucoma   . Cataract   . Diabetic neuropathy ([place])   . Palpitations 35years ago    Cardionet monitor - revealed mostly normal sinus rhythm, sinus bradycardia with first-degree A-V block heart rates mostly in the 50s and 60s with some 70s. No arrhythmias, PVCs or PACs noted.  Marland Kitchen TIA (transient ischemic attack) March 2014  . Diastolic dysfunction, left ventricle 11/30/14    EF 55%, grade 1 DD  . Stroke [facility]) 04-19-2013    tia and stroke. Weakness Rt hand  . Sleep apnea     cpap disontinued; test done 07/14/2008 ordered per  [place]  . Restless legs syndrome   . Spinal stenosis     L 3, L 4 and L 5, and C 1, C 2 and C3  . Complication of anesthesia     hard to wake up after anesthesia, trouble turning head  . Spinal headache   . Memory loss 06/03/2014  . Osteoporosis   . Normal cardiac stress test 11/30/14    Negative Myoview  . Presence of permanent cardiac pacemaker   . Kidney stones     "passed them"  . On home oxygen therapy     "2L oxygen concentrator @ night"  . Symptomatic bradycardia 03/24/15    [employer] PPM, Dr. Lovena Le  . Brittle bone disease     ROS:   All systems reviewed and negative except as noted in the HPI.   Past Surgical History  Procedure Laterality Date  . Abdominal hysterectomy    . Cholecystectomy    . Back surgery      steroid inj  . Tonsillectomy    . Appendectomy    . Esophagogastroduodenoscopy  08/09/2011    Procedure: ESOPHAGOGASTRODUODENOSCOPY (EGD);  Surgeon: Jeryl Columbia, MD;  Location: [facility];  Service: Endoscopy;  Laterality: N/A;   . Hot hemostasis  08/09/2011    Procedure: HOT HEMOSTASIS (ARGON PLASMA COAGULATION/BICAP);  Surgeon:  Jeryl Columbia, MD;  Location: [facility];  Service: Endoscopy;  Laterality: N/A;  . Trigger finger release  11/22/2011    Procedure: RELEASE TRIGGER FINGER/A-1 PULLEY;  Surgeon: Meredith Pel, MD;  Location: [facility];  Service: Orthopedics;  Laterality: Left;  Left trigger thumb release  . Cardiac catheterization  02/14/2012    no evidence of obstructive coronary disease ,low EDP with normal EF  . Doppler echocardiography  2014; October 2016    a. EF 0000000; normal diastolic pressures, no regional wall motion motion abnormalities;; b/ F 55-60%. Normal wall motion. GR 1 DD. No valve lesions  . Nm myocar perf wall motion  01/26/2012; 11/2014    a. EF 79%,LV normal,ST SEGMENT CHANGE SUGGESTIVE OF ISCHEMIA.; LOW RISK, EF 60%. No ischemia or infarction. No wall motion abnormality   . Lev doppler  05/29/2012    RIGHT EXTREM. NORMAL VENOUS DUPLEX  . Joint replacement    . Knee surgery Left done with 2nd left knee replacement    spacer bar placement  . Cataract extraction w/ intraocular lens  implant, bilateral Bilateral 2000s  . Trigger finger release Right yrs ago  . Esophagogastroduodenoscopy (egd) with propofol N/A 11/12/2013    Procedure: ESOPHAGOGASTRODUODENOSCOPY (EGD) WITH PROPOFOL;  Surgeon: Jeryl Columbia, MD;  Location: [facility];  Service: Endoscopy;  Laterality: N/A;  . Hot hemostasis N/A 11/12/2013    Procedure: HOT HEMOSTASIS (ARGON PLASMA COAGULATION/BICAP);  Surgeon: Jeryl Columbia, MD;  Location: [facility];  Service: Endoscopy;  Laterality: N/A;  . Left heart catheterization with coronary angiogram N/A 02/14/2012    Procedure: LEFT HEART CATHETERIZATION WITH CORONARY ANGIOGRAM;  Surgeon: Leonie Man, MD;  Location: [facility];  Service: Cardiovascular;  Laterality: N/A;  . Insert / replace / remove pacemaker  03/24/2015  . Total knee arthroplasty Bilateral   . Revision total  knee arthroplasty Left   . Glaucoma surgery Bilateral 2000s    "laser"  . Ep implantable device N/A 03/24/2015    [employer] PPM, Dr. Lovena Le     Family History  Problem Relation Age of Onset  . Cancer Mother   . Cancer - Prostate Father      Social History   Social History  . Marital Status: Married    Spouse Name: [name]  . Number of Children: 2  . Years of Education: college   Occupational History  . retired    Social History Main Topics  . Smoking status: Never Smoker   . Smokeless tobacco: Never Used  . Alcohol Use: No  . Drug Use: No  . Sexual Activity: Yes   Other Topics Concern  . Not on file   Social History Narrative   Married [name].) married 39 years.   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         BP 146/68 mmHg  Pulse 72  Ht 5' 7.5" (1.715 m)  Wt 289 lb 9.6 oz (131.362 kg)  BMI 44.66 kg/m2  SpO2 94%  Physical Exam:  obese appearing 75 yo woman, NAD HEENT: Unremarkable Neck:  No JVD, no thyromegally Lymphatics:  No adenopathy Back:  No CVA tenderness Lungs:  Clear with no wheezes HEART:  Regular rate rhythm, no murmurs, no rubs, no clicks Abd:  soft, positive bowel sounds, no organomegally, no rebound, no guarding Ext:  2 plus pulses, trace peripheral edema, no cyanosis, no clubbing Skin:  No rashes no nodules Neuro:  CN II through  XII intact, motor grossly intact  DEVICE  Normal device function.  See PaceArt for details.   Assess/Plan: 1. Sinus node dysfunction - Tara Jackson is s/p PPM insertion doing well. 2. PPM - her [employer] device is working normally. 3. HTN - Tara Jackson is encouraged to lose weight. Tara Jackson will maintain a low sodium diet.  Mikle Bosworth.D.

## 2015-06-22 ENCOUNTER — Emergency Department (HOSPITAL_COMMUNITY)
Admission: EM | Admit: 2015-06-22 | Discharge: 2015-06-23 | Disposition: A | Discharge status: Home / Self Care | Payer: Medicare HMO | Attending: Emergency Medicine | Admitting: Emergency Medicine

## 2015-06-22 ENCOUNTER — Emergency Department (HOSPITAL_COMMUNITY): Payer: Medicare HMO

## 2015-06-22 DIAGNOSIS — E039 Hypothyroidism, unspecified: Secondary | ICD-10-CM | POA: Insufficient documentation

## 2015-06-22 DIAGNOSIS — Z7902 Long term (current) use of antithrombotics/antiplatelets: Secondary | ICD-10-CM | POA: Insufficient documentation

## 2015-06-22 DIAGNOSIS — Z794 Long term (current) use of insulin: Secondary | ICD-10-CM | POA: Insufficient documentation

## 2015-06-22 DIAGNOSIS — I10 Essential (primary) hypertension: Secondary | ICD-10-CM | POA: Insufficient documentation

## 2015-06-22 DIAGNOSIS — R0602 Shortness of breath: Secondary | ICD-10-CM | POA: Diagnosis not present

## 2015-06-22 DIAGNOSIS — Z79899 Other long term (current) drug therapy: Secondary | ICD-10-CM | POA: Insufficient documentation

## 2015-06-22 DIAGNOSIS — M810 Age-related osteoporosis without current pathological fracture: Secondary | ICD-10-CM | POA: Diagnosis not present

## 2015-06-22 DIAGNOSIS — M1990 Unspecified osteoarthritis, unspecified site: Secondary | ICD-10-CM | POA: Insufficient documentation

## 2015-06-22 DIAGNOSIS — G4730 Sleep apnea, unspecified: Secondary | ICD-10-CM | POA: Insufficient documentation

## 2015-06-22 DIAGNOSIS — Z9981 Dependence on supplemental oxygen: Secondary | ICD-10-CM | POA: Diagnosis not present

## 2015-06-22 DIAGNOSIS — H409 Unspecified glaucoma: Secondary | ICD-10-CM | POA: Insufficient documentation

## 2015-06-22 DIAGNOSIS — F419 Anxiety disorder, unspecified: Secondary | ICD-10-CM | POA: Diagnosis not present

## 2015-06-22 DIAGNOSIS — Z86718 Personal history of other venous thrombosis and embolism: Secondary | ICD-10-CM | POA: Diagnosis not present

## 2015-06-22 DIAGNOSIS — Z7984 Long term (current) use of oral hypoglycemic drugs: Secondary | ICD-10-CM | POA: Diagnosis not present

## 2015-06-22 DIAGNOSIS — R079 Chest pain, unspecified: Secondary | ICD-10-CM | POA: Insufficient documentation

## 2015-06-22 DIAGNOSIS — G2581 Restless legs syndrome: Secondary | ICD-10-CM | POA: Diagnosis not present

## 2015-06-22 DIAGNOSIS — R69 Illness, unspecified: Secondary | ICD-10-CM | POA: Diagnosis not present

## 2015-06-22 DIAGNOSIS — E876 Hypokalemia: Secondary | ICD-10-CM | POA: Insufficient documentation

## 2015-06-22 DIAGNOSIS — J45901 Unspecified asthma with (acute) exacerbation: Secondary | ICD-10-CM | POA: Insufficient documentation

## 2015-06-22 DIAGNOSIS — Z87442 Personal history of urinary calculi: Secondary | ICD-10-CM | POA: Diagnosis not present

## 2015-06-22 DIAGNOSIS — E785 Hyperlipidemia, unspecified: Secondary | ICD-10-CM | POA: Insufficient documentation

## 2015-06-22 DIAGNOSIS — Z8673 Personal history of transient ischemic attack (TIA), and cerebral infarction without residual deficits: Secondary | ICD-10-CM | POA: Diagnosis not present

## 2015-06-22 DIAGNOSIS — Z862 Personal history of diseases of the blood and blood-forming organs and certain disorders involving the immune mechanism: Secondary | ICD-10-CM | POA: Diagnosis not present

## 2015-06-22 DIAGNOSIS — E1140 Type 2 diabetes mellitus with diabetic neuropathy, unspecified: Secondary | ICD-10-CM | POA: Insufficient documentation

## 2015-06-22 DIAGNOSIS — I509 Heart failure, unspecified: Secondary | ICD-10-CM | POA: Diagnosis not present

## 2015-06-22 DIAGNOSIS — F329 Major depressive disorder, single episode, unspecified: Secondary | ICD-10-CM | POA: Diagnosis not present

## 2015-06-22 LAB — I-STAT TROPONIN, ED: Troponin i, poc: 0.01 ng/mL (ref 0.00–0.08)

## 2015-06-22 LAB — BASIC METABOLIC PANEL
Anion gap: 12 (ref 5–15)
BUN: 11 mg/dL (ref 6–20)
CO2: 29 mmol/L (ref 22–32)
Calcium: 8.5 mg/dL — ABNORMAL LOW (ref 8.9–10.3)
Chloride: 98 mmol/L — ABNORMAL LOW (ref 101–111)
Creatinine, Ser: 1.76 mg/dL — ABNORMAL HIGH (ref 0.44–1.00)
GFR calc Af Amer: 31 mL/min — ABNORMAL LOW (ref >60)
GFR calc non Af Amer: 27 mL/min — ABNORMAL LOW (ref >60)
Glucose, Bld: 209 mg/dL — ABNORMAL HIGH (ref 65–99)
Potassium: 2.7 mmol/L — CL (ref 3.5–5.1)
Sodium: 139 mmol/L (ref 135–145)

## 2015-06-22 LAB — MAGNESIUM: Magnesium: 1.4 mg/dL — ABNORMAL LOW (ref 1.7–2.4)

## 2015-06-22 LAB — CBC
HCT: 34.7 % — ABNORMAL LOW (ref 36.0–46.0)
Hemoglobin: 11.1 g/dL — ABNORMAL LOW (ref 12.0–15.0)
MCH: 29.9 pg (ref 26.0–34.0)
MCHC: 32 g/dL (ref 30.0–36.0)
MCV: 93.5 fL (ref 78.0–100.0)
Platelets: 252 10*3/uL (ref 150–400)
RBC: 3.71 MIL/uL — ABNORMAL LOW (ref 3.87–5.11)
RDW: 13 % (ref 11.5–15.5)
WBC: 6.1 10*3/uL (ref 4.0–10.5)

## 2015-06-22 LAB — BRAIN NATRIURETIC PEPTIDE: B Natriuretic Peptide: 70.1 pg/mL (ref 0.0–100.0)

## 2015-06-22 MED ORDER — POTASSIUM CHLORIDE CRYS ER 20 MEQ PO TBCR: 20.0000 meq | EXTENDED_RELEASE_TABLET | Freq: Two times a day (BID) | ORAL | Status: DC

## 2015-06-22 MED ORDER — POTASSIUM CHLORIDE 10 MEQ/100ML IV SOLN
10.0000 meq | Freq: Once | INTRAVENOUS | Status: DC
  Filled 2015-06-22: qty 100

## 2015-06-22 MED ORDER — FENTANYL CITRATE (PF) 100 MCG/2ML IJ SOLN
25.0000 ug | Freq: Once | INTRAMUSCULAR | Status: AC
  Administered 2015-06-23: 25 ug via INTRAVENOUS
  Filled 2015-06-22: qty 2

## 2015-06-22 NOTE — ED Notes (Signed)
Pt called out stating she felt her CBG was low.  Okay'd with MD to check.  CBG 101

## 2015-06-22 NOTE — ED Notes (Signed)
Patient called out.  When this RN went in the room, pt stated she did not want to be admitted to the hospital because "my insurance does not pay for medications to be administered if I am only observation".  MD made aware.

## 2015-06-22 NOTE — ED Notes (Signed)
This RN went in to room to attempt to start the potassium ordered.  Patient was upset, stated she did not want this medication started, and said she wanted to go home.  This RN tried to explain the reasons for needing the potassium, and patient interrupted and stated she felt like no one had paid her any attention.  This RN sincerely apologized, and explained that that is not the case of our staff, and that we do truly care about her.  Patient continued to state she wanted to go home, and wanted to speak to MD.  This RN stated she would go and try to find him.  The patient stated "I don't want to have to wait.  I want him in here right now".  This RN explained that the MD may not be immediately available, that there may be other patients he might be in with.  Patient stated "no one is treating me like I am an emergency, no one has treated my pain, I want to speak to the doctor right now".  This RN stepped out and spoke immediately to the MD who is now at bedside updating patient and addressing concerns.

## 2015-06-22 NOTE — ED Notes (Signed)
Pt up to restroom.

## 2015-06-22 NOTE — ED Notes (Addendum)
Pt reports still having chest discomfort and sob with mild exertion. Reports pain has increased and also having back pain. Pt feels that abd is swollen. Was seen here on 5/5 for same. Has hx of PE, had negative doppler and VQ scan on last visit but +ddimer. Airway intact at triage. ekg done. spo 97%.

## 2015-06-22 NOTE — ED Notes (Signed)
MD back at bedside updating patient on conversation with hospitalist

## 2015-06-22 NOTE — Discharge Instructions (Signed)
Nonspecific Chest Pain  Chest pain can be caused by many different conditions. There is always a chance that your pain could be related to something serious, such as a heart attack or a blood clot in your lungs. Chest pain can also be caused by conditions that are not life-threatening. If you have chest pain, it is very important to follow up with your health care provider. CAUSES  Chest pain can be caused by:  Heartburn.  Pneumonia or bronchitis.  Anxiety or stress.  Inflammation around your heart (pericarditis) or lung (pleuritis or pleurisy).  A blood clot in your lung.  A collapsed lung (pneumothorax). It can develop suddenly on its own (spontaneous pneumothorax) or from trauma to the chest.  Shingles infection (varicella-zoster virus).  Heart attack.  Damage to the bones, muscles, and cartilage that make up your chest wall. This can include:  Bruised bones due to injury.  Strained muscles or cartilage due to frequent or repeated coughing or overwork.  Fracture to one or more ribs.  Sore cartilage due to inflammation (costochondritis). RISK FACTORS  Risk factors for chest pain may include:  Activities that increase your risk for trauma or injury to your chest.  Respiratory infections or conditions that cause frequent coughing.  Medical conditions or overeating that can cause heartburn.  Heart disease or family history of heart disease.  Conditions or health behaviors that increase your risk of developing a blood clot.  Having had chicken pox (varicella zoster). SIGNS AND SYMPTOMS Chest pain can feel like:  Burning or tingling on the surface of your chest or deep in your chest.  Crushing, pressure, aching, or squeezing pain.  Dull or sharp pain that is worse when you move, cough, or take a deep breath.  Pain that is also felt in your back, neck, shoulder, or arm, or pain that spreads to any of these areas. Your chest pain may come and go, or it may stay  constant. DIAGNOSIS Lab tests or other studies may be needed to find the cause of your pain. Your health care provider may have you take a test called an ambulatory ECG (electrocardiogram). An ECG records your heartbeat patterns at the time the test is performed. You may also have other tests, such as:  Transthoracic echocardiogram (TTE). During echocardiography, sound waves are used to create a picture of all of the heart structures and to look at how blood flows through your heart.  Transesophageal echocardiogram (TEE).This is a more advanced imaging test that obtains images from inside your body. It allows your health care provider to see your heart in finer detail.  Cardiac monitoring. This allows your health care provider to monitor your heart rate and rhythm in real time.  Holter monitor. This is a portable device that records your heartbeat and can help to diagnose abnormal heartbeats. It allows your health care provider to track your heart activity for several days, if needed.  Stress tests. These can be done through exercise or by taking medicine that makes your heart beat more quickly.  Blood tests.  Imaging tests. TREATMENT  Your treatment depends on what is causing your chest pain. Treatment may include:  Medicines. These may include:  Acid blockers for heartburn.  Anti-inflammatory medicine.  Pain medicine for inflammatory conditions.  Antibiotic medicine, if an infection is present.  Medicines to dissolve blood clots.  Medicines to treat coronary artery disease.  Supportive care for conditions that do not require medicines. This may include:  Resting.  Applying heat  or cold packs to injured areas.  Limiting activities until pain decreases. HOME CARE INSTRUCTIONS  If you were prescribed an antibiotic medicine, finish it all even if you start to feel better.  Avoid any activities that bring on chest pain.  Do not use any tobacco products, including  cigarettes, chewing tobacco, or electronic cigarettes. If you need help quitting, ask your health care provider.  Do not drink alcohol.  Take medicines only as directed by your health care provider.  Keep all follow-up visits as directed by your health care provider. This is important. This includes any further testing if your chest pain does not go away.  If heartburn is the cause for your chest pain, you may be told to keep your head raised (elevated) while sleeping. This reduces the chance that acid will go from your stomach into your esophagus.  Make lifestyle changes as directed by your health care provider. These may include:  Getting regular exercise. Ask your health care provider to suggest some activities that are safe for you.  Eating a heart-healthy diet. A registered dietitian can help you to learn healthy eating options.  Maintaining a healthy weight.  Managing diabetes, if necessary.  Reducing stress. SEEK MEDICAL CARE IF:  Your chest pain does not go away after treatment.  You have a rash with blisters on your chest.  You have a fever. SEEK IMMEDIATE MEDICAL CARE IF:   Your chest pain is worse.  You have an increasing cough, or you cough up blood.  You have severe abdominal pain.  You have severe weakness.  You faint.  You have chills.  You have sudden, unexplained chest discomfort.  You have sudden, unexplained discomfort in your arms, back, neck, or jaw.  You have shortness of breath at any time.  You suddenly start to sweat, or your skin gets clammy.  You feel nauseous or you vomit.  You suddenly feel light-headed or dizzy.  Your heart begins to beat quickly, or it feels like it is skipping beats. These symptoms may represent a serious problem that is an emergency. Do not wait to see if the symptoms will go away. Get medical help right away. Call your local emergency services (911 in the [place] Do not drive yourself to the hospital.   This  information is not intended to replace advice given to you by your health care provider. Make sure you discuss any questions you have with your health care provider.   Document Released: 11/03/2004 Document Revised: 02/14/2014 Document Reviewed: 08/30/2013 Elsevier Interactive Patient Education 2016 [employer].  Hypokalemia Hypokalemia means that the amount of potassium in the blood is lower than normal.Potassium is a chemical, called an electrolyte, that helps regulate the amount of fluid in the body. It also stimulates muscle contraction and helps nerves function properly.Most of the body's potassium is inside of cells, and only a very small amount is in the blood. Because the amount in the blood is so small, minor changes can be life-threatening. CAUSES  Antibiotics.  Diarrhea or vomiting.  Using laxatives too much, which can cause diarrhea.  Chronic kidney disease.  Water pills (diuretics).  Eating disorders (bulimia).  Low magnesium level.  Sweating a lot. SIGNS AND SYMPTOMS  Weakness.  Constipation.  Fatigue.  Muscle cramps.  Mental confusion.  Skipped heartbeats or irregular heartbeat (palpitations).  Tingling or numbness. DIAGNOSIS  Your health care provider can diagnose hypokalemia with blood tests. In addition to checking your potassium level, your health care provider may  also check other lab tests. TREATMENT Hypokalemia can be treated with potassium supplements taken by mouth or adjustments in your current medicines. If your potassium level is very low, you may need to get potassium through a vein (IV) and be monitored in the hospital. A diet high in potassium is also helpful. Foods high in potassium are:  Nuts, such as peanuts and pistachios.  Seeds, such as sunflower seeds and pumpkin seeds.  Peas, lentils, and lima beans.  Whole grain and bran cereals and breads.  Fresh fruit and vegetables, such as apricots, avocado, bananas, cantaloupe, kiwi,  oranges, tomatoes, asparagus, and potatoes.  Orange and tomato juices.  Red meats.  Fruit yogurt. HOME CARE INSTRUCTIONS  Take all medicines as prescribed by your health care provider.  Maintain a healthy diet by including nutritious food, such as fruits, vegetables, nuts, whole grains, and lean meats.  If you are taking a laxative, be sure to follow the directions on the label. SEEK MEDICAL CARE IF:  Your weakness gets worse.  You feel your heart pounding or racing.  You are vomiting or having diarrhea.  You are diabetic and having trouble keeping your blood glucose in the normal range. SEEK IMMEDIATE MEDICAL CARE IF:  You have chest pain, shortness of breath, or dizziness.  You are vomiting or having diarrhea for more than 2 days.  You faint. MAKE SURE YOU:   Understand these instructions.  Will watch your condition.  Will get help right away if you are not doing well or get worse.   This information is not intended to replace advice given to you by your health care provider. Make sure you discuss any questions you have with your health care provider.   Document Released: 01/24/2005 Document Revised: 02/14/2014 Document Reviewed: 07/27/2012 Elsevier Interactive Patient Education [employer].

## 2015-06-22 NOTE — ED Notes (Signed)
Spoke to lab representative who states they will add BNP and Mag

## 2015-06-22 NOTE — ED Provider Notes (Signed)
CSN: [id number]     Arrival date & time 06/22/15  1753 History   First MD Initiated Contact with Patient 06/22/15 2025     Chief Complaint  Patient presents with  . Chest Pain  . Shortness of Breath      Patient is a 75 y.o. female presenting with chest pain and shortness of breath. The history is provided by the patient.  Chest Pain Associated symptoms: shortness of breath   Associated symptoms: no abdominal pain, no back pain, no diaphoresis and no headache   Shortness of Breath Associated symptoms: chest pain   Associated symptoms: no abdominal pain, no diaphoresis and no headaches   Patient presents with chest pain and shortness of breath. Been short of breath the last 2 days. Seen in the ER 10 days ago for similar symptoms. At that time had negative Dopplers of her extremities and negative VQ scan. No fevers or chills. States that she got worse now is worried that it is her heart or her lungs. Does have more swelling or legs. The pain is dull and constant. Occasional cough.  Past Medical History  Diagnosis Date  . DVT (deep venous thrombosis) ([place])     "18 in RLE; 7 LLE prior to PE" (03/24/2015)  . Asthma   . Hypothyroidism   . Anxiety   . Depression   . H/O hiatal hernia   . Arthritis   . Anemia   . 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 Hyperlipidemia   . Diabetes mellitus type 2, insulin dependent ([place])   . Hypertension associated with diabetes ([place])   . Glaucoma   . Cataract   . Diabetic neuropathy ([place])   . Palpitations 35years ago    Cardionet monitor - revealed mostly normal sinus rhythm, sinus bradycardia with first-degree A-V block heart rates mostly in the 50s and 60s with some 70s. No arrhythmias, PVCs or PACs noted.  Marland Kitchen TIA (transient ischemic attack) March 2014  . Diastolic dysfunction, left ventricle 11/30/14    EF 55%, grade 1 DD  . Stroke [facility])  04-19-2013    tia and stroke. Weakness Rt hand  . Sleep apnea     cpap disontinued; test done 07/14/2008 ordered per  [place]  . Restless legs syndrome   . Spinal stenosis     L 3, L 4 and L 5, and C 1, C 2 and C3  . Complication of anesthesia     hard to wake up after anesthesia, trouble turning head  . Spinal headache   . Memory loss 06/03/2014  . Osteoporosis   . Normal cardiac stress test 11/30/14    Negative Myoview  . Presence of permanent cardiac pacemaker   . Kidney stones     "passed them"  . On home oxygen therapy     "2L oxygen concentrator @ night"  . Symptomatic bradycardia 03/24/15    [employer] PPM, Dr. Lovena Le  . Brittle bone disease    Past Surgical History  Procedure Laterality Date  . Abdominal hysterectomy    . Cholecystectomy    . Back surgery      steroid inj  . Tonsillectomy    . Appendectomy    . Esophagogastroduodenoscopy  08/09/2011    Procedure: ESOPHAGOGASTRODUODENOSCOPY (EGD);  Surgeon: Jeryl Columbia, MD;  Location: [facility];  Service: Endoscopy;  Laterality: N/A;  .  Hot hemostasis  08/09/2011    Procedure: HOT HEMOSTASIS (ARGON PLASMA COAGULATION/BICAP);  Surgeon: Jeryl Columbia, MD;  Location: [facility];  Service: Endoscopy;  Laterality: N/A;  . Trigger finger release  11/22/2011    Procedure: RELEASE TRIGGER FINGER/A-1 PULLEY;  Surgeon: Meredith Pel, MD;  Location: [place];  Service: Orthopedics;  Laterality: Left;  Left trigger thumb release  . Cardiac catheterization  02/14/2012    no evidence of obstructive coronary disease ,low EDP with normal EF  . Doppler echocardiography  2014; October 2016    a. EF 0000000; normal diastolic pressures, no regional wall motion motion abnormalities;; b/ F 55-60%. Normal wall motion. GR 1 DD. No valve lesions  . Nm myocar perf wall motion  01/26/2012; 11/2014    a. EF 79%,LV normal,ST SEGMENT CHANGE SUGGESTIVE OF ISCHEMIA.; LOW RISK, EF 60%. No ischemia or infarction. No wall motion abnormality   .  Lev doppler  05/29/2012    RIGHT EXTREM. NORMAL VENOUS DUPLEX  . Joint replacement    . Knee surgery Left done with 2nd left knee replacement    spacer bar placement  . Cataract extraction w/ intraocular lens  implant, bilateral Bilateral 2000s  . Trigger finger release Right yrs ago  . Esophagogastroduodenoscopy (egd) with propofol N/A 11/12/2013    Procedure: ESOPHAGOGASTRODUODENOSCOPY (EGD) WITH PROPOFOL;  Surgeon: Jeryl Columbia, MD;  Location: [facility] ENDOSCOPY;  Service: Endoscopy;  Laterality: N/A;  . Hot hemostasis N/A 11/12/2013    Procedure: HOT HEMOSTASIS (ARGON PLASMA COAGULATION/BICAP);  Surgeon: Jeryl Columbia, MD;  Location: [facility];  Service: Endoscopy;  Laterality: N/A;  . Left heart catheterization with coronary angiogram N/A 02/14/2012    Procedure: LEFT HEART CATHETERIZATION WITH CORONARY ANGIOGRAM;  Surgeon: Leonie Man, MD;  Location: [facility] CATH LAB;  Service: Cardiovascular;  Laterality: N/A;  . Insert / replace / remove pacemaker  03/24/2015  . Total knee arthroplasty Bilateral   . Revision total knee arthroplasty Left   . Glaucoma surgery Bilateral 2000s    "laser"  . Ep implantable device N/A 03/24/2015    [employer] PPM, Dr. Lovena Le   Family History  Problem Relation Age of Onset  . Cancer Mother   . Cancer - Prostate Father    Social History  Substance Use Topics  . Smoking status: Never Smoker   . Smokeless tobacco: Never Used  . Alcohol Use: No   OB History    No data available     Review of Systems  Constitutional: Negative for diaphoresis and appetite change.  Respiratory: Positive for shortness of breath.   Cardiovascular: Positive for chest pain.  Gastrointestinal: Negative for abdominal pain.  Genitourinary: Negative for dysuria.  Musculoskeletal: Negative for back pain.  Skin: Negative for wound.  Neurological: Negative for headaches.      Allergies  Iodine; Iohexol; Metoclopramide hcl; Sulfa antibiotics; Lactose intolerance (gi); and  Lansoprazole  Home Medications   Prior to Admission medications   Medication Sig Start Date End Date Taking? Authorizing Provider  acetaminophen (TYLENOL) 500 MG tablet Take 500 mg by mouth every 6 (six) hours as needed for moderate pain.    Yes Historical Provider, MD  albuterol (PROVENTIL HFA;VENTOLIN HFA) 108 (90 BASE) MCG/ACT inhaler Inhale 2 puffs into the lungs every 6 (six) hours as needed for wheezing.   Yes Historical Provider, MD  busPIRone (BUSPAR) 10 MG tablet Take 1 tablet (10 mg total) by mouth every morning. 06/03/14  Yes Garvin Fila, MD  Cholecalciferol (VITAMIN D-3  PO) Take 1 tablet by mouth every morning.    Yes Historical Provider, MD  clonazePAM (KLONOPIN) 0.5 MG tablet Take 0.5-1 mg by mouth 2 (two) times daily. Takes 2 tabs in am and 1 tab in pm   Yes Historical Provider, MD  clopidogrel (PLAVIX) 75 MG tablet Take 75 mg by mouth every morning.   Yes Historical Provider, MD  dicyclomine (BENTYL) 10 MG capsule Take 10 mg by mouth 3 (three) times daily as needed for spasms.    Yes Historical Provider, MD  esomeprazole (NEXIUM) 40 MG capsule Take 40 mg by mouth daily before breakfast.   Yes Historical Provider, MD  furosemide (LASIX) 80 MG tablet Take 80 mg by mouth daily.   Yes Historical Provider, MD  glipiZIDE (GLUCOTROL) 10 MG tablet Take 20 mg by mouth 2 (two) times daily before a meal.  05/03/14  Yes Historical Provider, MD  insulin NPH (HUMULIN N,NOVOLIN N) 100 UNIT/ML injection Inject 30 Units into the skin daily before breakfast. Novolin-N   Yes Historical Provider, MD  insulin regular (NOVOLIN R,HUMULIN R) 100 units/mL injection Inject 20 Units into the skin 3 (three) times daily before meals.   Yes Historical Provider, MD  levothyroxine (SYNTHROID, LEVOTHROID) 100 MCG tablet Take 100 mcg by mouth every morning.    Yes Historical Provider, MD  lidocaine (LIDODERM) 5 % Place 3 patches onto the skin every other day. Remove & Discard patch within 12 hours or as directed  by MD   Yes Historical Provider, MD  Linaclotide (LINZESS) 290 MCG CAPS capsule Take 290 mcg by mouth every morning.    Yes Historical Provider, MD  LORazepam (ATIVAN) 0.5 MG tablet Take 0.5 mg by mouth daily as needed for anxiety.   Yes Historical Provider, MD  meclizine (ANTIVERT) 12.5 MG tablet Take 12.5 mg by mouth 3 (three) times daily as needed for dizziness.    Yes Historical Provider, MD  nortriptyline (PAMELOR) 50 MG capsule Take 2 capsules (100 mg total) by mouth at bedtime. Patient taking differently: Take 50 mg by mouth at bedtime.  04/07/15  Yes Garvin Fila, MD  polyethylene glycol (MIRALAX / GLYCOLAX) packet Take 17 g by mouth daily as needed for mild constipation.   Yes Historical Provider, MD  potassium chloride (MICRO-K) 10 MEQ CR capsule Take 10 mEq by mouth daily.    Yes Historical Provider, MD  propranolol (INDERAL) 10 MG tablet Take 10 mg by mouth daily. 04/25/15  Yes Historical Provider, MD  rosuvastatin (CRESTOR) 20 MG tablet Take 1 tablet (20 mg total) by mouth every morning. 06/15/15  Yes Leonie Man, MD  Sennosides (EX-LAX PO) Take 1 tablet by mouth daily as needed (constipation).    Yes Historical Provider, MD  sodium phosphate (FLEET) enema Place 1 enema rectally once. follow package directions   Yes Historical Provider, MD  sucralfate (CARAFATE) 1 G tablet Take 1 g by mouth daily.    Yes Historical Provider, MD  terconazole (TERAZOL 7) 0.4 % vaginal cream Place 1 applicator vaginally at bedtime as needed (for yeast infection).  04/18/14  Yes Historical Provider, MD  traMADol (ULTRAM) 50 MG tablet Take 1 tablet (50 mg total) by mouth every 12 (twelve) hours as needed for moderate pain. 03/27/15  Yes Bhavinkumar Bhagat, PA  valsartan (DIOVAN) 160 MG tablet Take 80 mg by mouth daily.   Yes Historical Provider, MD  diclofenac sodium (VOLTAREN) 1 % GEL Apply 2 g topically 3 (three) times daily as needed (to affected area).  11/30/14   Bonnielee Haff, MD  potassium chloride SA  (K-DUR,KLOR-CON) 20 MEQ tablet Take 1 tablet (20 mEq total) by mouth 2 (two) times daily. 06/22/15   Davonna Belling, MD   BP 100/83 mmHg  Pulse 66  Temp(Src) 99 F (37.2 C) (Oral)  Resp 18  Wt 291 lb 3.2 oz (132.087 kg)  SpO2 98% Physical Exam  Constitutional: She appears well-developed.  HENT:  Head: Atraumatic.  Neck: Neck supple.  Cardiovascular: Normal rate.   Pulmonary/Chest: Effort normal.  Abdominal: Soft. There is no tenderness.  Musculoskeletal: She exhibits edema.  Bilateral lower extremity pitting edema.  Skin: Skin is warm.  Psychiatric: She has a normal mood and affect.    ED Course  Procedures (including critical care time) Labs Review Labs Reviewed  BASIC METABOLIC PANEL - Abnormal; Notable for the following:    Potassium 2.7 (*)    Chloride 98 (*)    Glucose, Bld 209 (*)    Creatinine, Ser 1.76 (*)    Calcium 8.5 (*)    GFR calc non Af Amer 27 (*)    GFR calc Af Amer 31 (*)    All other components within normal limits  CBC - Abnormal; Notable for the following:    RBC 3.71 (*)    Hemoglobin 11.1 (*)    HCT 34.7 (*)    All other components within normal limits  MAGNESIUM - Abnormal; Notable for the following:    Magnesium 1.4 (*)    All other components within normal limits  BRAIN NATRIURETIC PEPTIDE  I-STAT TROPOININ, ED  CBG MONITORING, ED    Imaging Review Dg Chest 2 View  06/22/2015  CLINICAL DATA:  Chest pain, shortness of breath after exertion. EXAM: CHEST  2 VIEW COMPARISON:  Jun 12, 2015. FINDINGS: The heart size and mediastinal contours are within normal limits. Both lungs are clear. No pneumothorax or pleural effusion is noted. Left-sided pacemaker is unchanged in position. The visualized skeletal structures are unremarkable. IMPRESSION: No active cardiopulmonary disease. Electronically Signed   By: Marijo Conception, M.D.   On: 06/22/2015 18:55   I have personally reviewed and evaluated these images and lab results as part of my medical  decision-making.   EKG Interpretation   Date/Time:  Monday Jun 22 2015 18:01:17 EDT Ventricular Rate:  77 PR Interval:  236 QRS Duration: 96 QT Interval:  448 QTC Calculation: 506 R Axis:   57 Text Interpretation:  Sinus rhythm with 1st degree A-V block Cannot rule  out Anterior infarct , age undetermined T wave abnormality, consider  inferolateral ischemia Abnormal ECG Confirmed by Alvino Chapel  MD, Ovid Curd  [phone]) on 06/22/2015 7:57:46 PM      MDM   Final diagnoses:  Chest pain, unspecified chest pain type  Hypokalemia  Hypomagnesemia    Patient with chest pain. Edema of her legs. Has had recent workups for PE and cardiac cause. Doubt severe cause the pain. Her weight is somewhat stable. BNP is normal. However potassium is down shows of more fluid on her legs. Discussed with patient about admission but she was unwilling to be admitted unless she would be an inpatient status and not an observation. She is not willing to stay otherwise. I discussed with the hospitalist and they will not keep her as an inpatient. Patient decided she would rather go home. This is likely safe. Potassium is low but unlikely to dangerous point at this time. Will discharge home when she can follow-up with cardiology.  Davonna Belling, MD 06/23/15 [phone]

## 2015-06-23 DIAGNOSIS — R69 Illness, unspecified: Secondary | ICD-10-CM | POA: Diagnosis not present

## 2015-06-23 DIAGNOSIS — M1990 Unspecified osteoarthritis, unspecified site: Secondary | ICD-10-CM | POA: Diagnosis not present

## 2015-06-23 DIAGNOSIS — E876 Hypokalemia: Secondary | ICD-10-CM | POA: Diagnosis not present

## 2015-06-23 DIAGNOSIS — I10 Essential (primary) hypertension: Secondary | ICD-10-CM | POA: Diagnosis not present

## 2015-06-23 DIAGNOSIS — R079 Chest pain, unspecified: Secondary | ICD-10-CM | POA: Diagnosis not present

## 2015-06-23 DIAGNOSIS — J45901 Unspecified asthma with (acute) exacerbation: Secondary | ICD-10-CM | POA: Diagnosis not present

## 2015-06-23 DIAGNOSIS — E039 Hypothyroidism, unspecified: Secondary | ICD-10-CM | POA: Diagnosis not present

## 2015-06-23 DIAGNOSIS — E1140 Type 2 diabetes mellitus with diabetic neuropathy, unspecified: Secondary | ICD-10-CM | POA: Diagnosis not present

## 2015-06-24 ENCOUNTER — Encounter: Payer: Medicare HMO | Admitting: Internal Medicine

## 2015-06-24 LAB — CBG MONITORING, ED: Glucose-Capillary: 111 mg/dL — ABNORMAL HIGH (ref 65–99)

## 2015-07-09 ENCOUNTER — Other Ambulatory Visit: Payer: Self-pay | Admitting: Adult Health

## 2015-07-09 ENCOUNTER — Ambulatory Visit (INDEPENDENT_AMBULATORY_CARE_PROVIDER_SITE_OTHER): Payer: Medicare HMO | Admitting: Adult Health

## 2015-07-09 ENCOUNTER — Encounter: Payer: Self-pay | Admitting: Adult Health

## 2015-07-09 VITALS — BP 124/60 | HR 76 | Resp 18

## 2015-07-09 DIAGNOSIS — R519 Headache, unspecified: Secondary | ICD-10-CM

## 2015-07-09 DIAGNOSIS — R51 Headache: Secondary | ICD-10-CM | POA: Diagnosis not present

## 2015-07-09 MED ORDER — NORTRIPTYLINE HCL 50 MG PO CAPS: 100.0000 mg | ORAL_CAPSULE | Freq: Every day | ORAL | Status: DC

## 2015-07-09 NOTE — Patient Instructions (Signed)
Try Taking nortriptyline as prescribed 100 mg at bedtime (2 tablets) if unable to tolerate call me.  If your symptoms worsen or you develop new symptoms please let us know.

## 2015-07-09 NOTE — Progress Notes (Signed)
PATIENT: Tara Jackson DOB: 1940/06/05  REASON FOR VISIT: follow up- headache, memory loss HISTORY FROM: patient  HISTORY OF PRESENT ILLNESS: Tara Jackson is a 75 year old female with a history of headaches and memory loss. She returns today for follow-up. She reports that nortriptyline has been beneficial or her headaches. Although she still has a headache daily. She states that the headache is always on the right side of the head. She does have photophobia but denies phonophobia. Occasionally will have nausea. She states that her headache can get severe and rates her pain as a 10 out of 10 on the pain scale. She uses Excedrin for her headaches. In the past Tylenol No. 3 has been provided however the patient is not clear if she is taking this or not. The patient reports that she has been taking one tablet of nortriptyline however the prescription was written for 2 tablets at bedtime. Patient denies any new symptoms. She returns today for an evaluation.  HISTORY 04/07/15: Tara Jackson is a 75 year old female with a history of headaches, TIA and memory loss. She returns today for follow-up. She reports that today she has a headache. She states that it started approximately 1 month ago and has been daily in nature. She rates her pain a 10 out of 10 today. She describes her discomfort as pulsatile in the temporal region. She does describe a stiff neck. Denies nausea and vomiting. Does have some photophobia. Denies phonophobia. She has tried tramadol 100 mg with no relief. In the past the patient has been on tizanidine (2014) and according to the notes she received good benefit from this medication. The patient does not recall this medication and is unsure why she is no longer taking it-- other than her prescription may have ran out. The patient does feel that her memory is worse. She states that last night she did not remember eating supper. The patient is able to complete most ADLs independently. Although she  requires assistance with putting on her shoes and stockings. Her husband also picks out her clothes for her. She no longer does much cooking because she feels dizzy at times. She does not operate a motor vehicle. She continues to do the finances however last month she had some complications however she is going to try again this month. The patient is not currently on any medication for her memory. Her BuSpar was increased at the last visit however the patient is not aware of this. Her husband is with her today at the visit. 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  HISTORY: Tara Jackson is a 75 year old African lady who developed new-onset chronic daily headaches for the last 4 weeks. She states that she had some dental extractions done of her upper molar teeth about a week ago following which she noticed worsening of her pain which predominantly is in the temporal head regions but does spread all over. She states the pain is constant, severe. At times it is pulsatile. She was seen in the emergency room at [facility] on 07/23/12. She was treated with IV Dilaudid, Phenergan, Zofran and Benadryl in no hopping on the subtotal benefit. CT scan of the maxillary region showed no evidence of abscess associated with a total extraction but showed postoperative changes with communication between the oral cavity and nasal maxillary sinus. There was evidence of sinusitis seen. CT scan of the head showed no acute abnormality. The headache is accompanied by some  nausea and light sensitivity and occasional vomiting. The headache remains unchanged throughout the day. She is currently being treated with antibiotic course for a swollen gums. She has tried oxycodone, Ultram and Tylenol which have not helped. She does also have history of degenerative cervical spine disease she does complain of neck pain and muscle tightness and spasm as well. She has had 4 minor falls since her last visit 2  months ago. She even had a brief episode of blacking out yesterday but her husband caught her. She found that her blood pressure was low and her primary care physician has reduced her blood pressure medication since then. She denies any prior history of migraine headaches of similar headaches in the past. She denies any blurred vision or loss of vision accompanying her headaches. 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   UPDATE 11/15/12 (LL): Tara Jackson comes to office for revisit for daily headache after wisdom tooth extraction. MRI cervical spine shows prominent spondylitic changes mostly at C5-6 and C4-5. with lateral disc osteophyte protrusions resulting in moderate foraminal narrowing on the right and mild canal stenosis without definite compression. MRI brain showed mild changes of microvascular ischemia. Advanced changes of chronic paranasal sinusitis. She states that her headaches are much better, about 84 % better than last visit. She does not have a headache today. She is using Tizanadine 4 mg for neck muscle tension. She had a recent sleep study which showed snoring but AHI within normal limits. Restless legs syndrome was suggested due to frequent leg movements. She states at night sometimes she feels like something is crawling on her lower legs and she feels like she must move them.   UPDATE 09/19/13 (LL): Since last visit Tara Jackson states that she has had some problems with abdominal pain and constipation. She recently went to the ER with abdominal pain and constipation times one week, despite the use of several enemas. Her headaches have not been bad in the last few months. She had a epidural pain injection in May at pain management. She denies any focal TIA or stroke symptoms, but has numerous symptom complaints on review of systems. She is concerned about leg swelling and abdominal swelling,  although her weight is up another 21 pounds since last visit. She states she has been having frequent falls without injury, but when she falls it's difficult for her to get up by herself and even difficult for her husband to get her up. She does not exercise and uses a motorized wheelchair his most of the time. Of particular concern today is an increase in hand tremor right greater than left, which is present at rest but more intense with action. It is causing her embarrassment. Update 06/03/2014 : She returns for follow-up after last visit 6 months ago. She is accompanied by husband. Patient reports new complaint of memory difficulties for the last 3 months. She states that this is mainly short-term memory. She cannot remember recent events and conversations and needs to be reminded multiple times. She often asked the same questions when she was told to answer a short while back. At times she feels that she has trouble completing sentences and can stop in mid sentence and search for what she was trying to say. She has had some episodes of hypoglycemia but feels her memory difficulties and are not related to that. She  was prescribed primidone at last visit for tremor but she did not fill the prescription but she remains on Inderal 40 mg which seems to help her tremor quite well. She was hospitalized about 4-6 weeks ago with chest pain and all workup was negative except heart rate was slow. She has long-standing history of depression and does take BuSpar 7.5 mg daily as well as Klonopin but feels her depression is not adequately treated. She has had no recurrent stroke or TIA symptoms. She remains on clopidogrel which is tolerating well without significant bleeding or bruising. She states her blood pressure and cholesterol control have been good Update 04/06/2015 : She returns for follow-up after last visit 6 months ago. She is accompanied by her husband. Patient states that in memory diminished slightly worse and  she has trouble remembering some recent information. However she can still manage to take care of her daily activities by herself. She was recently hospitalized 2 weeks ago and had a pacemaker implanted by Dr. Lovena Le. She continues to have daily headaches but off and on and not constant. The headaches vary in severity from 6/10-10/10. She takes Tylenol which seems to help. On average needs to 3 tablets a day at least. She needs to take tramadol occasionally which doesn't work as well. She has been taking Pamelor 50 mg capsule at night for her remote history of anxiety attacks. She states her blood pressure is well controlled and today it is low at 103/61. She is having trouble controlling her sugars which have fluctuated a bit she has not had any recurrent stroke or TIA symptoms. She remains on Plavix which is tolerating well without bleeding or bruising.    REVIEW OF SYSTEMS: Out of a complete 14 system review of symptoms, the patient complains only of the following symptoms, and all other reviewed systems are negative.  Appetite change, fatigue, ear discharge, ear pain, runny nose, cough, wheezing, blurred vision, light sensitivity, eye itching, eye redness, eye discharge, chest pain, leg swelling, restless leg, nausea, diarrhea, constipation, rectal bleeding, excessive thirst, heat intolerance, food allergies, urgency, urine decrease, dizziness, headache, numbness, bruise/bleed easily, anemia  ALLERGIES: Allergies  Allergen Reactions  . Iodine Other (See Comments)    ONLY IV--Makes unconscious  . Iohexol Other (See Comments)     Code: SOB, Desc: cardiac arrest w/ iv contrast, has never used 13 hr prep//alice calhoun, Onset Date: [id number]   . Metoclopramide Hcl Other (See Comments)    suicidal  . Sulfa Antibiotics Hives  . Lactose Intolerance (Gi) Diarrhea  . Lansoprazole Nausea And Vomiting    HOME MEDICATIONS: Outpatient Prescriptions Prior to Visit  Medication Sig Dispense Refill  .  acetaminophen (TYLENOL) 500 MG tablet Take 500 mg by mouth every 6 (six) hours as needed for moderate pain.     Marland Kitchen albuterol (PROVENTIL HFA;VENTOLIN HFA) 108 (90 BASE) MCG/ACT inhaler Inhale 2 puffs into the lungs every 6 (six) hours as needed for wheezing.    . busPIRone (BUSPAR) 10 MG tablet Take 1 tablet (10 mg total) by mouth every morning. 60 tablet 1  . Cholecalciferol (VITAMIN D-3 PO) Take 1 tablet by mouth every morning.     . clonazePAM (KLONOPIN) 0.5 MG tablet Take 0.5-1 mg by mouth 2 (two) times daily. Takes 2 tabs in am and 1 tab in pm    . clopidogrel (PLAVIX) 75 MG tablet Take 75 mg by mouth every morning.    . diclofenac sodium (VOLTAREN) 1 % GEL Apply 2 g topically 3 (  three) times daily as needed (to affected area). 1 Tube 0  . dicyclomine (BENTYL) 10 MG capsule Take 10 mg by mouth 3 (three) times daily as needed for spasms.     Marland Kitchen esomeprazole (NEXIUM) 40 MG capsule Take 40 mg by mouth daily before breakfast.    . furosemide (LASIX) 80 MG tablet Take 80 mg by mouth daily.    Marland Kitchen glipiZIDE (GLUCOTROL) 10 MG tablet Take 20 mg by mouth 2 (two) times daily before a meal.     . insulin NPH (HUMULIN N,NOVOLIN N) 100 UNIT/ML injection Inject 30 Units into the skin daily before breakfast. Novolin-N    . insulin regular (NOVOLIN R,HUMULIN R) 100 units/mL injection Inject 20 Units into the skin 3 (three) times daily before meals.    Marland Kitchen levothyroxine (SYNTHROID, LEVOTHROID) 100 MCG tablet Take 100 mcg by mouth every morning.     . lidocaine (LIDODERM) 5 % Place 3 patches onto the skin every other day. Remove & Discard patch within 12 hours or as directed by MD    . Linaclotide (LINZESS) 290 MCG CAPS capsule Take 290 mcg by mouth every morning.     Marland Kitchen LORazepam (ATIVAN) 0.5 MG tablet Take 0.5 mg by mouth daily as needed for anxiety.    . meclizine (ANTIVERT) 12.5 MG tablet Take 12.5 mg by mouth 3 (three) times daily as needed for dizziness.     . polyethylene glycol (MIRALAX / GLYCOLAX) packet Take  17 g by mouth daily as needed for mild constipation.    . potassium chloride (MICRO-K) 10 MEQ CR capsule Take 10 mEq by mouth daily.     . potassium chloride SA (K-DUR,KLOR-CON) 20 MEQ tablet Take 1 tablet (20 mEq total) by mouth 2 (two) times daily. 10 tablet 0  . propranolol (INDERAL) 10 MG tablet Take 10 mg by mouth daily.  3  . rosuvastatin (CRESTOR) 20 MG tablet Take 1 tablet (20 mg total) by mouth every morning. 90 tablet 3  . Sennosides (EX-LAX PO) Take 1 tablet by mouth daily as needed (constipation).     . sodium phosphate (FLEET) enema Place 1 enema rectally once. follow package directions    . sucralfate (CARAFATE) 1 G tablet Take 1 g by mouth daily.     Marland Kitchen terconazole (TERAZOL 7) 0.4 % vaginal cream Place 1 applicator vaginally at bedtime as needed (for yeast infection).     . traMADol (ULTRAM) 50 MG tablet Take 1 tablet (50 mg total) by mouth every 12 (twelve) hours as needed for moderate pain. 30 tablet 0  . valsartan (DIOVAN) 160 MG tablet Take 80 mg by mouth daily.    . nortriptyline (PAMELOR) 50 MG capsule Take 2 capsules (100 mg total) by mouth at bedtime. (Patient taking differently: Take 50 mg by mouth at bedtime. ) 60 capsule 3   No facility-administered medications prior to visit.    PAST MEDICAL HISTORY: Past Medical History  Diagnosis Date  . DVT (deep venous thrombosis) ([place])     "18 in RLE; 7 LLE prior to PE" (03/24/2015)  . Asthma   . Hypothyroidism   . Anxiety   . Depression   . H/O hiatal hernia   . Arthritis   . Anemia   . PE (pulmonary thromboembolism) ([place]) x 3, last one was ~ 2003    history of recurrent RLE DVT with PE - last PE ~>13 yrs; Maintained on Plavix  . Hyperlipidemia   . Diabetes mellitus type 2, insulin dependent ([place])   .  Hypertension associated with diabetes ([place])   . Glaucoma   . Cataract   . Diabetic neuropathy ([place])   . Palpitations 35years ago    Cardionet monitor - revealed mostly normal sinus rhythm, sinus bradycardia with  first-degree A-V block heart rates mostly in the 50s and 60s with some 70s. No arrhythmias, PVCs or PACs noted.  Marland Kitchen TIA (transient ischemic attack) March 2014  . Diastolic dysfunction, left ventricle 11/30/14    EF 55%, grade 1 DD  . Stroke [facility]) 04-19-2013    tia and stroke. Weakness Rt hand  . Sleep apnea     cpap disontinued; test done 07/14/2008 ordered per  [place]  . Restless legs syndrome   . Spinal stenosis     L 3, L 4 and L 5, and C 1, C 2 and C3  . Complication of anesthesia     hard to wake up after anesthesia, trouble turning head  . Spinal headache   . Memory loss 06/03/2014  . Osteoporosis   . Normal cardiac stress test 11/30/14    Negative Myoview  . Presence of permanent cardiac pacemaker   . Kidney stones     "passed them"  . On home oxygen therapy     "2L oxygen concentrator @ night"  . Symptomatic bradycardia 03/24/15    [employer] PPM, Dr. Lovena Le  . Brittle bone disease     PAST SURGICAL HISTORY: Past Surgical History  Procedure Laterality Date  . Abdominal hysterectomy    . Cholecystectomy    . Back surgery      steroid inj  . Tonsillectomy    . Appendectomy    . Esophagogastroduodenoscopy  08/09/2011    Procedure: ESOPHAGOGASTRODUODENOSCOPY (EGD);  Surgeon: Jeryl Columbia, MD;  Location: [facility];  Service: Endoscopy;  Laterality: N/A;  . Hot hemostasis  08/09/2011    Procedure: HOT HEMOSTASIS (ARGON PLASMA COAGULATION/BICAP);  Surgeon: Jeryl Columbia, MD;  Location: [facility];  Service: Endoscopy;  Laterality: N/A;  . Trigger finger release  11/22/2011    Procedure: RELEASE TRIGGER FINGER/A-1 PULLEY;  Surgeon: Meredith Pel, MD;  Location: [place];  Service: Orthopedics;  Laterality: Left;  Left trigger thumb release  . Cardiac catheterization  02/14/2012    no evidence of obstructive coronary disease ,low EDP with normal EF  . Doppler echocardiography  2014; October 2016    a. EF 0000000; normal diastolic pressures, no regional wall motion  motion abnormalities;; b/ F 55-60%. Normal wall motion. GR 1 DD. No valve lesions  . Nm myocar perf wall motion  01/26/2012; 11/2014    a. EF 79%,LV normal,ST SEGMENT CHANGE SUGGESTIVE OF ISCHEMIA.; LOW RISK, EF 60%. No ischemia or infarction. No wall motion abnormality   . Lev doppler  05/29/2012    RIGHT EXTREM. NORMAL VENOUS DUPLEX  . Joint replacement    . Knee surgery Left done with 2nd left knee replacement    spacer bar placement  . Cataract extraction w/ intraocular lens  implant, bilateral Bilateral 2000s  . Trigger finger release Right yrs ago  . Esophagogastroduodenoscopy (egd) with propofol N/A 11/12/2013    Procedure: ESOPHAGOGASTRODUODENOSCOPY (EGD) WITH PROPOFOL;  Surgeon: Jeryl Columbia, MD;  Location: [facility];  Service: Endoscopy;  Laterality: N/A;  . Hot hemostasis N/A 11/12/2013    Procedure: HOT HEMOSTASIS (ARGON PLASMA COAGULATION/BICAP);  Surgeon: Jeryl Columbia, MD;  Location: [facility];  Service: Endoscopy;  Laterality: N/A;  . Left heart catheterization with coronary angiogram N/A 02/14/2012  Procedure: LEFT HEART CATHETERIZATION WITH CORONARY ANGIOGRAM;  Surgeon: Leonie Man, MD;  Location: [facility] CATH LAB;  Service: Cardiovascular;  Laterality: N/A;  . Insert / replace / remove pacemaker  03/24/2015  . Total knee arthroplasty Bilateral   . Revision total knee arthroplasty Left   . Glaucoma surgery Bilateral 2000s    "laser"  . Ep implantable device N/A 03/24/2015    [employer] PPM, Dr. Lovena Le    FAMILY HISTORY: Family History  Problem Relation Age of Onset  . Cancer Mother   . Cancer - Prostate Father     SOCIAL HISTORY: Social History   Social History  . Marital Status: Married    Spouse Name: [name]  . Number of Children: 2  . Years of Education: college   Occupational History  . retired    Social History Main Topics  . Smoking status: Never Smoker   . Smokeless tobacco: Never Used  . Alcohol Use: No  . Drug Use: No  . Sexual  Activity: Yes   Other Topics Concern  . Not on file   Social History Narrative   Married [name].) married 25 years.   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          PHYSICAL EXAM  Filed Vitals:   07/09/15 1355  BP: 124/60  Pulse: 76  Resp: 18   There is no weight on file to calculate BMI. MMSE - Mini Mental State Exam 07/09/2015 10/07/2014  Orientation to time 5 5  Orientation to Place 5 5  Registration 3 3  Attention/ Calculation 5 5  Recall 2 3  Language- name 2 objects 2 2  Language- repeat 1 1  Language- follow 3 step command 3 2  Language- read & follow direction 1 1  Write a sentence 1 1  Copy design 1 1  Total score 29 29    Generalized: Well developed, in no acute distress, Obese  Neurological examination  Mentation: Alert oriented to time, place, history taking. Follows all commands speech and language fluent Cranial nerve II-XII: Pupils were equal round reactive to light. Extraocular movements were full, visual field were full on confrontational test. Facial sensation and strength were normal. Uvula tongue midline. Head turning and shoulder shrug  were normal and symmetric. Motor: The motor testing reveals 5 over 5 strength LUE, LLE, RLE. 4/5 in RUE. Good symmetric motor tone is noted throughout.  Sensory: Sensory testing is intact to soft touch on all 4 extremities. No evidence of extinction is noted.  Coordination: Cerebellar testing reveals good finger-nose-finger and heel-to-shin bilaterally.  Gait and station: Patient is in a wheelchair. Reflexes: Deep tendon reflexes are symmetric and normal bilaterally.   DIAGNOSTIC DATA (LABS, IMAGING, TESTING) - I reviewed patient records, labs, notes, testing and imaging myself where available.  Lab Results  Component Value Date   WBC 6.1 06/22/2015   HGB 11.1* 06/22/2015   HCT 34.7* 06/22/2015   MCV 93.5 06/22/2015   PLT 252 06/22/2015      Component Value Date/Time     NA 139 06/22/2015 1816   K 2.7* 06/22/2015 1816   CL 98* 06/22/2015 1816   CO2 29 06/22/2015 1816   GLUCOSE 209* 06/22/2015 1816   BUN 11 06/22/2015 1816   CREATININE 1.76* 06/22/2015 1816   CREATININE 1.64* 04/09/2015 1438   CALCIUM 8.5* 06/22/2015 1816   PROT 6.5 06/12/2015 0620   ALBUMIN 3.3* 06/12/2015 0620   AST  19 06/12/2015 0620   ALT 10* 06/12/2015 0620   ALKPHOS 52 06/12/2015 0620   BILITOT 0.4 06/12/2015 0620   GFRNONAA 27* 06/22/2015 1816   GFRAA 31* 06/22/2015 1816    Lab Results  Component Value Date   HGBA1C 8.6* 03/24/2015   Lab Results  Component Value Date   [id number] 06/03/2014   Lab Results  Component Value Date   TSH 1.580 06/03/2014      ASSESSMENT AND PLAN 75 y.o. year old female  has a past medical history of DVT (deep venous thrombosis) ([place]); Asthma; Hypothyroidism; Anxiety; Depression; H/O hiatal hernia; Arthritis; Anemia; PE (pulmonary thromboembolism) ([facility]) (x 3, last one was ~ 2003); Hyperlipidemia; Diabetes mellitus type 2, insulin dependent ([place]); Hypertension associated with diabetes ([place]); Glaucoma; Cataract; Diabetic neuropathy ([place]); Palpitations (35years ago); TIA (transient ischemic attack) (March 2014); Diastolic dysfunction, left ventricle (11/30/14); Stroke [facility]) (04-19-2013); Sleep apnea; Restless legs syndrome; Spinal stenosis; Complication of anesthesia; Spinal headache; Memory loss (06/03/2014); Osteoporosis; Normal cardiac stress test (11/30/14); Presence of permanent cardiac pacemaker; Kidney stones; On home oxygen therapy; Symptomatic bradycardia (03/24/15); and Brittle bone disease. here with:  1. Chronic daily headaches  The patient did not realize that she was supposed to increase her nortriptyline 100 mg. I have advised that she should begin taking 2 tablets at bedtime. If she is unable to tolerate this dose she will let me know. The patient has asked that I send a prescription to her local pharmacy for only 20 tablets.  She would like to see the increase is beneficial before she gets her medication through mail order. I have reminded the patient that she should avoid taking Excedrin Migraine daily. As this can cause a re-bound headache. Patient voiced understanding. She will follow-up in 6 months with Dr. Leonie Man.    Ward Givens, MSN, NP-C 07/09/2015, 4:30 PM [facility] [address] [place], [place] [address] [phone]

## 2015-07-22 NOTE — Progress Notes (Signed)
I reviewed note and agree with plan.   Penni Bombard, MD [id number], [id number] PM Certified in Neurology, Neurophysiology and Neuroimaging  [facility] [address], [place], [place] [address] [phone]

## 2015-07-23 DIAGNOSIS — I509 Heart failure, unspecified: Secondary | ICD-10-CM | POA: Diagnosis not present

## 2015-07-23 DIAGNOSIS — I10 Essential (primary) hypertension: Secondary | ICD-10-CM | POA: Diagnosis not present

## 2015-08-03 ENCOUNTER — Ambulatory Visit
Admission: RE | Admit: 2015-08-03 | Discharge: 2015-08-03 | Disposition: A | Discharge status: Home / Self Care | Payer: Medicare HMO | Source: Ambulatory Visit | Attending: Internal Medicine | Admitting: Internal Medicine

## 2015-08-03 DIAGNOSIS — Z1231 Encounter for screening mammogram for malignant neoplasm of breast: Secondary | ICD-10-CM

## 2015-08-10 DIAGNOSIS — H401111 Primary open-angle glaucoma, right eye, mild stage: Secondary | ICD-10-CM | POA: Diagnosis not present

## 2015-08-10 DIAGNOSIS — H401121 Primary open-angle glaucoma, left eye, mild stage: Secondary | ICD-10-CM | POA: Diagnosis not present

## 2015-08-10 DIAGNOSIS — H04123 Dry eye syndrome of bilateral lacrimal glands: Secondary | ICD-10-CM | POA: Diagnosis not present

## 2015-08-13 DIAGNOSIS — R51 Headache: Secondary | ICD-10-CM | POA: Diagnosis not present

## 2015-08-13 DIAGNOSIS — Z794 Long term (current) use of insulin: Secondary | ICD-10-CM | POA: Diagnosis not present

## 2015-08-13 DIAGNOSIS — H409 Unspecified glaucoma: Secondary | ICD-10-CM | POA: Diagnosis not present

## 2015-08-13 DIAGNOSIS — R197 Diarrhea, unspecified: Secondary | ICD-10-CM | POA: Diagnosis not present

## 2015-08-13 DIAGNOSIS — R69 Illness, unspecified: Secondary | ICD-10-CM | POA: Diagnosis not present

## 2015-08-13 DIAGNOSIS — M545 Low back pain: Secondary | ICD-10-CM | POA: Diagnosis not present

## 2015-08-13 DIAGNOSIS — E1140 Type 2 diabetes mellitus with diabetic neuropathy, unspecified: Secondary | ICD-10-CM | POA: Diagnosis not present

## 2015-08-13 DIAGNOSIS — E11319 Type 2 diabetes mellitus with unspecified diabetic retinopathy without macular edema: Secondary | ICD-10-CM | POA: Diagnosis not present

## 2015-08-13 DIAGNOSIS — E038 Other specified hypothyroidism: Secondary | ICD-10-CM | POA: Diagnosis not present

## 2015-08-13 DIAGNOSIS — I129 Hypertensive chronic kidney disease with stage 1 through stage 4 chronic kidney disease, or unspecified chronic kidney disease: Secondary | ICD-10-CM | POA: Diagnosis not present

## 2015-08-17 DIAGNOSIS — R197 Diarrhea, unspecified: Secondary | ICD-10-CM | POA: Diagnosis not present

## 2015-08-22 DIAGNOSIS — I10 Essential (primary) hypertension: Secondary | ICD-10-CM | POA: Diagnosis not present

## 2015-08-22 DIAGNOSIS — I509 Heart failure, unspecified: Secondary | ICD-10-CM | POA: Diagnosis not present

## 2015-09-21 ENCOUNTER — Ambulatory Visit (INDEPENDENT_AMBULATORY_CARE_PROVIDER_SITE_OTHER): Payer: Medicare HMO | Admitting: *Deleted

## 2015-09-21 DIAGNOSIS — E1149 Type 2 diabetes mellitus with other diabetic neurological complication: Secondary | ICD-10-CM | POA: Diagnosis not present

## 2015-09-21 DIAGNOSIS — I495 Sick sinus syndrome: Secondary | ICD-10-CM | POA: Diagnosis not present

## 2015-09-21 NOTE — Progress Notes (Signed)
Remote pacemaker transmission.   

## 2015-09-22 DIAGNOSIS — I509 Heart failure, unspecified: Secondary | ICD-10-CM | POA: Diagnosis not present

## 2015-09-22 DIAGNOSIS — I10 Essential (primary) hypertension: Secondary | ICD-10-CM | POA: Diagnosis not present

## 2015-09-23 ENCOUNTER — Encounter: Payer: Self-pay | Admitting: Cardiology

## 2015-09-29 LAB — CUP PACEART REMOTE DEVICE CHECK
Battery Remaining Longevity: 174 mo
Battery Remaining Percentage: 100 %
Brady Statistic RA Percent Paced: 47 %
Brady Statistic RV Percent Paced: 26 %
Date Time Interrogation Session: 20170814070100
Implantable Lead Implant Date: 20170214
Implantable Lead Implant Date: 20170214
Implantable Lead Location: 753859
Implantable Lead Location: 753860
Implantable Lead Model: 7740
Implantable Lead Model: 7741
Implantable Lead Serial Number: 649859
Implantable Lead Serial Number: 728741
Lead Channel Impedance Value: 740 Ohm
Lead Channel Impedance Value: 745 Ohm
Lead Channel Pacing Threshold Amplitude: 0.5 V
Lead Channel Pacing Threshold Amplitude: 1.3 V
Lead Channel Pacing Threshold Pulse Width: 0.4 ms
Lead Channel Pacing Threshold Pulse Width: 0.4 ms
Lead Channel Setting Pacing Amplitude: 1.7 V
Lead Channel Setting Pacing Amplitude: 2 V
Lead Channel Setting Pacing Pulse Width: 0.4 ms
Lead Channel Setting Sensing Sensitivity: 2.5 mV
Pulse Gen Serial Number: 718098

## 2015-10-08 DIAGNOSIS — Z950 Presence of cardiac pacemaker: Secondary | ICD-10-CM | POA: Diagnosis not present

## 2015-10-08 DIAGNOSIS — Z794 Long term (current) use of insulin: Secondary | ICD-10-CM | POA: Diagnosis not present

## 2015-10-08 DIAGNOSIS — G4736 Sleep related hypoventilation in conditions classified elsewhere: Secondary | ICD-10-CM | POA: Diagnosis not present

## 2015-10-08 DIAGNOSIS — I517 Cardiomegaly: Secondary | ICD-10-CM | POA: Diagnosis not present

## 2015-10-08 DIAGNOSIS — R69 Illness, unspecified: Secondary | ICD-10-CM | POA: Diagnosis not present

## 2015-10-08 DIAGNOSIS — N183 Chronic kidney disease, stage 3 (moderate): Secondary | ICD-10-CM | POA: Diagnosis not present

## 2015-10-08 DIAGNOSIS — E1149 Type 2 diabetes mellitus with other diabetic neurological complication: Secondary | ICD-10-CM | POA: Diagnosis not present

## 2015-10-08 DIAGNOSIS — I129 Hypertensive chronic kidney disease with stage 1 through stage 4 chronic kidney disease, or unspecified chronic kidney disease: Secondary | ICD-10-CM | POA: Diagnosis not present

## 2015-10-08 DIAGNOSIS — H4089 Other specified glaucoma: Secondary | ICD-10-CM | POA: Diagnosis not present

## 2015-10-23 DIAGNOSIS — I509 Heart failure, unspecified: Secondary | ICD-10-CM | POA: Diagnosis not present

## 2015-10-23 DIAGNOSIS — I10 Essential (primary) hypertension: Secondary | ICD-10-CM | POA: Diagnosis not present

## 2015-11-22 DIAGNOSIS — I509 Heart failure, unspecified: Secondary | ICD-10-CM | POA: Diagnosis not present

## 2015-11-22 DIAGNOSIS — I10 Essential (primary) hypertension: Secondary | ICD-10-CM | POA: Diagnosis not present

## 2015-12-03 DIAGNOSIS — D132 Benign neoplasm of duodenum: Secondary | ICD-10-CM | POA: Diagnosis not present

## 2015-12-03 DIAGNOSIS — K5901 Slow transit constipation: Secondary | ICD-10-CM | POA: Diagnosis not present

## 2015-12-10 DIAGNOSIS — E1140 Type 2 diabetes mellitus with diabetic neuropathy, unspecified: Secondary | ICD-10-CM | POA: Diagnosis not present

## 2015-12-10 DIAGNOSIS — Z6841 Body Mass Index (BMI) 40.0 and over, adult: Secondary | ICD-10-CM | POA: Diagnosis not present

## 2015-12-10 DIAGNOSIS — Z794 Long term (current) use of insulin: Secondary | ICD-10-CM | POA: Diagnosis not present

## 2015-12-10 DIAGNOSIS — R69 Illness, unspecified: Secondary | ICD-10-CM | POA: Diagnosis not present

## 2015-12-10 DIAGNOSIS — I10 Essential (primary) hypertension: Secondary | ICD-10-CM | POA: Diagnosis not present

## 2015-12-10 DIAGNOSIS — E038 Other specified hypothyroidism: Secondary | ICD-10-CM | POA: Diagnosis not present

## 2015-12-10 DIAGNOSIS — I129 Hypertensive chronic kidney disease with stage 1 through stage 4 chronic kidney disease, or unspecified chronic kidney disease: Secondary | ICD-10-CM | POA: Diagnosis not present

## 2015-12-10 DIAGNOSIS — K5909 Other constipation: Secondary | ICD-10-CM | POA: Diagnosis not present

## 2015-12-21 ENCOUNTER — Ambulatory Visit (INDEPENDENT_AMBULATORY_CARE_PROVIDER_SITE_OTHER): Payer: Medicare HMO | Admitting: *Deleted

## 2015-12-21 DIAGNOSIS — I495 Sick sinus syndrome: Secondary | ICD-10-CM

## 2015-12-22 NOTE — Progress Notes (Signed)
Remote pacemaker transmission.   

## 2015-12-23 DIAGNOSIS — I10 Essential (primary) hypertension: Secondary | ICD-10-CM | POA: Diagnosis not present

## 2015-12-23 DIAGNOSIS — I509 Heart failure, unspecified: Secondary | ICD-10-CM | POA: Diagnosis not present

## 2016-01-01 LAB — CUP PACEART REMOTE DEVICE CHECK
Battery Remaining Longevity: 168 mo
Battery Remaining Percentage: 100 %
Brady Statistic RA Percent Paced: 46 %
Brady Statistic RV Percent Paced: 29 %
Date Time Interrogation Session: 20171113080100
Implantable Lead Implant Date: 20170214
Implantable Lead Implant Date: 20170214
Implantable Lead Location: 753859
Implantable Lead Location: 753860
Implantable Lead Model: 7740
Implantable Lead Model: 7741
Implantable Lead Serial Number: 649859
Implantable Lead Serial Number: 728741
Implantable Pulse Generator Implant Date: 20170214
Lead Channel Impedance Value: 702 Ohm
Lead Channel Impedance Value: 736 Ohm
Lead Channel Pacing Threshold Amplitude: 0.5 V
Lead Channel Pacing Threshold Amplitude: 1.3 V
Lead Channel Pacing Threshold Pulse Width: 0.4 ms
Lead Channel Pacing Threshold Pulse Width: 0.4 ms
Lead Channel Setting Pacing Amplitude: 1.7 V
Lead Channel Setting Pacing Amplitude: 2 V
Lead Channel Setting Pacing Pulse Width: 0.4 ms
Lead Channel Setting Sensing Sensitivity: 2.5 mV
Pulse Gen Serial Number: 718098

## 2016-01-12 ENCOUNTER — Encounter: Payer: Self-pay | Admitting: Neurology

## 2016-01-12 ENCOUNTER — Ambulatory Visit (INDEPENDENT_AMBULATORY_CARE_PROVIDER_SITE_OTHER): Payer: Medicare HMO | Admitting: Neurology

## 2016-01-12 VITALS — BP 137/68 | HR 68 | Wt 287.0 lb

## 2016-01-12 DIAGNOSIS — G3184 Mild cognitive impairment, so stated: Secondary | ICD-10-CM

## 2016-01-12 MED ORDER — NORTRIPTYLINE HCL 50 MG PO CAPS: 100.0000 mg | ORAL_CAPSULE | Freq: Every day | ORAL | 0 refills | Status: DC

## 2016-01-12 NOTE — Progress Notes (Signed)
PATIENT: Tara Jackson DOB: 1940/06/05  REASON FOR VISIT: follow up- headache, memory loss HISTORY FROM: patient  HISTORY OF PRESENT ILLNESS: Tara Jackson is a 75 year old female with a history of headaches and memory loss. She returns today for follow-up. She reports that nortriptyline has been beneficial or her headaches. Although she still has a headache daily. She states that the headache is always on the right side of the head. She does have photophobia but denies phonophobia. Occasionally will have nausea. She states that her headache can get severe and rates her pain as a 10 out of 10 on the pain scale. She uses Excedrin for her headaches. In the past Tylenol No. 3 has been provided however the patient is not clear if she is taking this or not. The patient reports that she has been taking one tablet of nortriptyline however the prescription was written for 2 tablets at bedtime. Patient denies any new symptoms. She returns today for an evaluation.  HISTORY 04/07/15: Tara Jackson is a 75 year old female with a history of headaches, TIA and memory loss. She returns today for follow-up. She reports that today she has a headache. She states that it started approximately 1 month ago and has been daily in nature. She rates her pain a 10 out of 10 today. She describes her discomfort as pulsatile in the temporal region. She does describe a stiff neck. Denies nausea and vomiting. Does have some photophobia. Denies phonophobia. She has tried tramadol 100 mg with no relief. In the past the patient has been on tizanidine (2014) and according to the notes she received good benefit from this medication. The patient does not recall this medication and is unsure why she is no longer taking it-- other than her prescription may have ran out. The patient does feel that her memory is worse. She states that last night she did not remember eating supper. The patient is able to complete most ADLs independently. Although she  requires assistance with putting on her shoes and stockings. Her husband also picks out her clothes for her. She no longer does much cooking because she feels dizzy at times. She does not operate a motor vehicle. She continues to do the finances however last month she had some complications however she is going to try again this month. The patient is not currently on any medication for her memory. Her BuSpar was increased at the last visit however the patient is not aware of this. Her husband is with her today at the visit. 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  HISTORY: Tara Jackson is a 75 year old African lady who developed new-onset chronic daily headaches for the last 4 weeks. She states that she had some dental extractions done of her upper molar teeth about a week ago following which she noticed worsening of her pain which predominantly is in the temporal head regions but does spread all over. She states the pain is constant, severe. At times it is pulsatile. She was seen in the emergency room at [facility] on 07/23/12. She was treated with IV Dilaudid, Phenergan, Zofran and Benadryl in no hopping on the subtotal benefit. CT scan of the maxillary region showed no evidence of abscess associated with a total extraction but showed postoperative changes with communication between the oral cavity and nasal maxillary sinus. There was evidence of sinusitis seen. CT scan of the head showed no acute abnormality. The headache is accompanied by some  nausea and light sensitivity and occasional vomiting. The headache remains unchanged throughout the day. She is currently being treated with antibiotic course for a swollen gums. She has tried oxycodone, Ultram and Tylenol which have not helped. She does also have history of degenerative cervical spine disease she does complain of neck pain and muscle tightness and spasm as well. She has had 4 minor falls since her last visit 2  months ago. She even had a brief episode of blacking out yesterday but her husband caught her. She found that her blood pressure was low and her primary care physician has reduced her blood pressure medication since then. She denies any prior history of migraine headaches of similar headaches in the past. She denies any blurred vision or loss of vision accompanying her headaches. 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   UPDATE 11/15/12 (LL): Tara Jackson comes to office for revisit for daily headache after wisdom tooth extraction. MRI cervical spine shows prominent spondylitic changes mostly at C5-6 and C4-5. with lateral disc osteophyte protrusions resulting in moderate foraminal narrowing on the right and mild canal stenosis without definite compression. MRI brain showed mild changes of microvascular ischemia. Advanced changes of chronic paranasal sinusitis. She states that her headaches are much better, about 84 % better than last visit. She does not have a headache today. She is using Tizanadine 4 mg for neck muscle tension. She had a recent sleep study which showed snoring but AHI within normal limits. Restless legs syndrome was suggested due to frequent leg movements. She states at night sometimes she feels like something is crawling on her lower legs and she feels like she must move them.   UPDATE 09/19/13 (LL): Since last visit Tara Jackson states that she has had some problems with abdominal pain and constipation. She recently went to the ER with abdominal pain and constipation times one week, despite the use of several enemas. Her headaches have not been bad in the last few months. She had a epidural pain injection in May at pain management. She denies any focal TIA or stroke symptoms, but has numerous symptom complaints on review of systems. She is concerned about leg swelling and abdominal swelling,  although her weight is up another 21 pounds since last visit. She states she has been having frequent falls without injury, but when she falls it's difficult for her to get up by herself and even difficult for her husband to get her up. She does not exercise and uses a motorized wheelchair his most of the time. Of particular concern today is an increase in hand tremor right greater than left, which is present at rest but more intense with action. It is causing her embarrassment. Update 06/03/2014 : She returns for follow-up after last visit 6 months ago. She is accompanied by husband. Patient reports new complaint of memory difficulties for the last 3 months. She states that this is mainly short-term memory. She cannot remember recent events and conversations and needs to be reminded multiple times. She often asked the same questions when she was told to answer a short while back. At times she feels that she has trouble completing sentences and can stop in mid sentence and search for what she was trying to say. She has had some episodes of hypoglycemia but feels her memory difficulties and are not related to that. She  was prescribed primidone at last visit for tremor but she did not fill the prescription but she remains on Inderal 40 mg which seems to help her tremor quite well. She was hospitalized about 4-6 weeks ago with chest pain and all workup was negative except heart rate was slow. She has long-standing history of depression and does take BuSpar 7.5 mg daily as well as Klonopin but feels her depression is not adequately treated. She has had no recurrent stroke or TIA symptoms. She remains on clopidogrel which is tolerating well without significant bleeding or bruising. She states her blood pressure and cholesterol control have been good Update 04/06/2015 : She returns for follow-up after last visit 6 months ago. She is accompanied by her husband. Patient states that in memory diminished slightly worse and  she has trouble remembering some recent information. However she can still manage to take care of her daily activities by herself. She was recently hospitalized 2 weeks ago and had a pacemaker implanted by Dr. Lovena Le. She continues to have daily headaches but off and on and not constant. The headaches vary in severity from 6/10-10/10. She takes Tylenol which seems to help. On average needs to 3 tablets a day at least. She needs to take tramadol occasionally which doesn't work as well. She has been taking Pamelor 50 mg capsule at night for her remote history of anxiety attacks. She states her blood pressure is well controlled and today it is low at 103/61. She is having trouble controlling her sugars which have fluctuated a bit she has not had any recurrent stroke or TIA symptoms. She remains on Plavix which is tolerating well without bleeding or bruising.  Update 01/12/2016 : She returns for follow-up after last visit 9 months ago. She is accompanied by husband. Patient states that she had a minor fall about 3 weeks ago and slipped and her head hit a car door. Since then she's been having daily headaches. She describes this as mainly posterior headaches which are mild to moderate in nature. She had been asked to increase her dose of Pamelor to 100 mg at night and last visit but she has not done that. She also admits to having some cognitive difficulties with decreased attention and poor short-term memory and at times having trouble remembering names and completing sentences. She has a lifelong history of depression and the past has tried multiple medications including Zoloft and Wellbutrin and Paxil but currently she is only on Klonopin which she takes for anxiety. She does participate in some stress relaxation activities like meditation daily but is not doing neck stretching exercises.She is on klonopin for anxiety but not on anything for depression. REVIEW OF SYSTEMS: Out of a complete 14 system review of  symptoms, the patient complains only of the following symptoms, and all other reviewed systems are negative.  Appetite change, fatigue, ear discharge, ear pain, runny nose, cough, wheezing, blurred vision, light sensitivity, eye itching, eye redness, eye discharge, chest pain, leg swelling, restless leg, nausea, diarrhea, constipation, rectal bleeding, excessive thirst, heat intolerance, food allergies, urgency, urine decrease, dizziness, headache, numbness, bruise/bleed easily, anemia  ALLERGIES: Allergies  Allergen Reactions  . Iodine Other (See Comments)    ONLY IV--Makes unconscious  . Iohexol Other (See Comments)     Code: SOB, Desc: cardiac arrest w/ iv contrast, has never used 13 hr prep//alice calhoun, Onset Date: [id number]   . Metoclopramide Hcl Other (See Comments)    suicidal  . Sulfa Antibiotics Hives  . Lactose  Intolerance (Gi) Diarrhea  . Lansoprazole Nausea And Vomiting    HOME MEDICATIONS: Outpatient Medications Prior to Visit  Medication Sig Dispense Refill  . acetaminophen (TYLENOL) 500 MG tablet Take 500 mg by mouth every 6 (six) hours as needed for moderate pain.     Marland Kitchen albuterol (PROVENTIL HFA;VENTOLIN HFA) 108 (90 BASE) MCG/ACT inhaler Inhale 2 puffs into the lungs every 6 (six) hours as needed for wheezing.    . busPIRone (BUSPAR) 10 MG tablet Take 1 tablet (10 mg total) by mouth every morning. 60 tablet 1  . Cholecalciferol (VITAMIN D-3 PO) Take 1 tablet by mouth every morning.     . clonazePAM (KLONOPIN) 0.5 MG tablet Take 0.5-1 mg by mouth 2 (two) times daily. Takes 2 tabs in am and 1 tab in pm    . clopidogrel (PLAVIX) 75 MG tablet Take 75 mg by mouth every morning.    . dicyclomine (BENTYL) 10 MG capsule Take 10 mg by mouth 3 (three) times daily as needed for spasms.     Marland Kitchen esomeprazole (NEXIUM) 40 MG capsule Take 40 mg by mouth daily before breakfast.    . furosemide (LASIX) 80 MG tablet Take 80 mg by mouth daily.    Marland Kitchen glipiZIDE (GLUCOTROL) 10 MG tablet Take  20 mg by mouth 2 (two) times daily before a meal.     . insulin NPH (HUMULIN N,NOVOLIN N) 100 UNIT/ML injection Inject 30 Units into the skin daily before breakfast. Novolin-N    . insulin regular (NOVOLIN R,HUMULIN R) 100 units/mL injection Inject 20 Units into the skin 3 (three) times daily before meals.    Marland Kitchen levothyroxine (SYNTHROID, LEVOTHROID) 100 MCG tablet Take 100 mcg by mouth every morning.     . lidocaine (LIDODERM) 5 % Place 3 patches onto the skin every other day. Remove & Discard patch within 12 hours or as directed by MD    . Linaclotide (LINZESS) 290 MCG CAPS capsule Take 290 mcg by mouth every morning.     Marland Kitchen LORazepam (ATIVAN) 0.5 MG tablet Take 0.5 mg by mouth daily as needed for anxiety.    . meclizine (ANTIVERT) 12.5 MG tablet Take 12.5 mg by mouth 3 (three) times daily as needed for dizziness.     . polyethylene glycol (MIRALAX / GLYCOLAX) packet Take 17 g by mouth daily as needed for mild constipation.    . potassium chloride (MICRO-K) 10 MEQ CR capsule Take 10 mEq by mouth daily.     . potassium chloride SA (K-DUR,KLOR-CON) 20 MEQ tablet Take 1 tablet (20 mEq total) by mouth 2 (two) times daily. 10 tablet 0  . rosuvastatin (CRESTOR) 20 MG tablet Take 1 tablet (20 mg total) by mouth every morning. 90 tablet 3  . Sennosides (EX-LAX PO) Take 1 tablet by mouth daily as needed (constipation).     . sodium phosphate (FLEET) enema Place 1 enema rectally once. follow package directions    . sucralfate (CARAFATE) 1 G tablet Take 1 g by mouth daily.     Marland Kitchen terconazole (TERAZOL 7) 0.4 % vaginal cream Place 1 applicator vaginally at bedtime as needed (for yeast infection).     . traMADol (ULTRAM) 50 MG tablet Take 1 tablet (50 mg total) by mouth every 12 (twelve) hours as needed for moderate pain. 30 tablet 0  . valsartan (DIOVAN) 160 MG tablet Take 80 mg by mouth daily.    . nortriptyline (PAMELOR) 50 MG capsule TAKE 2 CAPSULES(100 MG) BY MOUTH AT BEDTIME 180 capsule  0  . diclofenac  sodium (VOLTAREN) 1 % GEL Apply 2 g topically 3 (three) times daily as needed (to affected area). (Patient not taking: Reported on 01/12/2016) 1 Tube 0  . propranolol (INDERAL) 10 MG tablet Take 10 mg by mouth daily.  3   No facility-administered medications prior to visit.     PAST MEDICAL HISTORY: Past Medical History:  Diagnosis Date  . Anemia   . Anxiety   . Arthritis   . Asthma   . Brittle bone disease   . Cataract   . Complication of anesthesia    hard to wake up after anesthesia, trouble turning head  . Depression   . Diabetes mellitus type 2, insulin dependent ([place])   . Diabetic neuropathy ([place])   . Diastolic dysfunction, left ventricle 11/30/14   EF 55%, grade 1 DD  . DVT (deep venous thrombosis) ([place])    "18 in RLE; 7 LLE prior to PE" (03/24/2015)  . Glaucoma   . H/O hiatal hernia   . Hyperlipidemia   . Hypertension associated with diabetes ([place])   . Hypothyroidism   . Kidney stones    "passed them"  . Memory loss 06/03/2014  . Normal cardiac stress test 11/30/14   Negative Myoview  . On home oxygen therapy    "2L oxygen concentrator @ night"  . Osteoporosis   . Palpitations 35years ago   Cardionet monitor - revealed mostly normal sinus rhythm, sinus bradycardia with first-degree A-V block heart rates mostly in the 50s and 60s with some 70s. No arrhythmias, PVCs or PACs noted.  . 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 Presence of permanent cardiac pacemaker   . Restless legs syndrome   . Sleep apnea    cpap disontinued; test done 07/14/2008 ordered per  [facility]  . Spinal headache   . Spinal stenosis    L 3, L 4 and L 5, and C 1, C 2 and C3  . Stroke [facility]) 04-19-2013   tia and stroke. Weakness Rt hand  . Symptomatic bradycardia 03/24/15   [employer] PPM, Dr. Lovena Le  . TIA (transient ischemic attack) March 2014    PAST SURGICAL HISTORY: Past  Surgical History:  Procedure Laterality Date  . ABDOMINAL HYSTERECTOMY    . APPENDECTOMY    . BACK SURGERY     steroid inj  . CARDIAC CATHETERIZATION  02/14/2012   no evidence of obstructive coronary disease ,low EDP with normal EF  . CATARACT EXTRACTION W/ INTRAOCULAR LENS  IMPLANT, BILATERAL Bilateral 2000s  . CHOLECYSTECTOMY    . DOPPLER ECHOCARDIOGRAPHY  2014; October 2016   a. EF 95-28%; normal diastolic pressures, no regional wall motion motion abnormalities;; b/ F 55-60%. Normal wall motion. GR 1 DD. No valve lesions  . EP IMPLANTABLE DEVICE N/A 03/24/2015   [facility] PPM, Dr. Lovena Le  . ESOPHAGOGASTRODUODENOSCOPY  08/09/2011   Procedure: ESOPHAGOGASTRODUODENOSCOPY (EGD);  Surgeon: Jeryl Columbia, MD;  Location: [facility];  Service: Endoscopy;  Laterality: N/A;  . ESOPHAGOGASTRODUODENOSCOPY (EGD) WITH PROPOFOL N/A 11/12/2013   Procedure: ESOPHAGOGASTRODUODENOSCOPY (EGD) WITH PROPOFOL;  Surgeon: Jeryl Columbia, MD;  Location: [facility] ENDOSCOPY;  Service: Endoscopy;  Laterality: N/A;  . GLAUCOMA SURGERY Bilateral 2000s   "laser"  . HOT HEMOSTASIS  08/09/2011   Procedure: HOT HEMOSTASIS (ARGON PLASMA COAGULATION/BICAP);  Surgeon: Jeryl Columbia, MD;  Location: [facility];  Service: Endoscopy;  Laterality: N/A;  . HOT HEMOSTASIS N/A 11/12/2013   Procedure: HOT HEMOSTASIS (ARGON PLASMA COAGULATION/BICAP);  Surgeon: Jeryl Columbia, MD;  Location: [facility];  Service: Endoscopy;  Laterality: N/A;  . INSERT / REPLACE / REMOVE PACEMAKER  03/24/2015  . JOINT REPLACEMENT    . KNEE SURGERY Left done with 2nd left knee replacement   spacer bar placement  . LEFT HEART CATHETERIZATION WITH CORONARY ANGIOGRAM N/A 02/14/2012   Procedure: LEFT HEART CATHETERIZATION WITH CORONARY ANGIOGRAM;  Surgeon: Leonie Man, MD;  Location: [facility] C[facility] LAB;  Service: Cardiovascular;  Laterality: N/A;  . LEV DOPPLER  05/29/2012   RIGHT EXTREM. NORMAL VENOUS DUPLEX  . NM MYOCAR PERF WALL MOTION  01/26/2012; 11/2014    a. EF 79%,LV normal,ST SEGMENT CHANGE SUGGESTIVE OF ISCHEMIA.; LOW RISK, EF 60%. No ischemia or infarction. No wall motion abnormality   . PACEMAKER PLACEMENT    . REVISION TOTAL KNEE ARTHROPLASTY Left   . TONSILLECTOMY    . TOTAL KNEE ARTHROPLASTY Bilateral   . TRIGGER FINGER RELEASE  11/22/2011   Procedure: RELEASE TRIGGER FINGER/A-1 PULLEY;  Surgeon: Meredith Pel, MD;  Location: [facility];  Service: Orthopedics;  Laterality: Left;  Left trigger thumb release  . TRIGGER FINGER RELEASE Right yrs ago    FAMILY HISTORY: Family History  Problem Relation Age of Onset  . Cancer Mother   . Cancer - Prostate Father     SOCIAL HISTORY: Social History   Social History  . Marital status: Married    Spouse name: [name]  . Number of children: 2  . Years of education: college   Occupational History  . retired    Social History Main Topics  . Smoking status: Never Smoker  . Smokeless tobacco: Never Used  . Alcohol use No  . Drug use: No  . Sexual activity: Yes   Other Topics Concern  . Not on file   Social History Narrative   Married [name].) married 42 years.   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          PHYSICAL EXAM  Vitals:   01/12/16 1147  BP: 137/68  Pulse: 68  Weight: 287 lb (130.2 kg)   Body mass index is 44.29 kg/m. MMSE - Mini Mental State Exam 07/09/2015 10/07/2014  Orientation to time 5 5  Orientation to Place 5 5  Registration 3 3  Attention/ Calculation 5 5  Recall 2 3  Language- name 2 objects 2 2  Language- repeat 1 1  Language- follow 3 step command 3 2  Language- read & follow direction 1 1  Write a sentence 1 1  Copy design 1 1  Total score 29 29    Generalized: Well developed, in no acute distress, Obese  Neurological examination  Mentation: Alert oriented to time, place, history taking. Follows all commands speech and language fluent. Depressed affect. Mini-Mental status exam scored 27/30 with  deficits in orientation and recall. Animal naming test 9 only. Geriatric depression scale she scored 15 suggestive of moderate to severe depression. Clock drawing 4/4. Cranial nerve II-XII: Pupils were equal round reactive to light. Extraocular movements were full, visual field were full on confrontational test. Facial sensation and strength were normal. Uvula tongue midline. Head turning and shoulder shrug  were normal and symmetric. Motor: The motor testing reveals 5 over 5 strength LUE, LLE, RLE. 4/5 in RUE. Good symmetric motor tone is noted throughout.  Sensory: Sensory testing is intact to soft touch on all 4 extremities. No evidence of extinction is noted.  Coordination: Cerebellar testing reveals good finger-nose-finger and heel-to-shin bilaterally.  Gait and station: Patient is in a wheelchair. Reflexes: Deep tendon reflexes are symmetric and normal bilaterally.   DIAGNOSTIC DATA (LABS, IMAGING, TESTING) - I reviewed patient records, labs, notes, testing and imaging myself where available.  Lab Results  Component Value Date   WBC 6.1 06/22/2015   HGB 11.1 (L) 06/22/2015   HCT 34.7 (L) 06/22/2015   MCV 93.5 06/22/2015   PLT 252 06/22/2015      Component Value Date/Time   NA 139 06/22/2015 1816   K 2.7 (LL) 06/22/2015 1816   CL 98 (L) 06/22/2015 1816   CO2 29 06/22/2015 1816   GLUCOSE 209 (H) 06/22/2015 1816   BUN 11 06/22/2015 1816   CREATININE 1.76 (H) 06/22/2015 1816   CREATININE 1.64 (H) 04/09/2015 1438   CALCIUM 8.5 (L) 06/22/2015 1816   PROT 6.5 06/12/2015 0620   ALBUMIN 3.3 (L) 06/12/2015 0620   AST 19 06/12/2015 0620   ALT 10 (L) 06/12/2015 0620   ALKPHOS 52 06/12/2015 0620   BILITOT 0.4 06/12/2015 0620   GFRNONAA 27 (L) 06/22/2015 1816   GFRAA 31 (L) 06/22/2015 1816    Lab Results  Component Value Date   HGBA1C 8.6 (H) 03/24/2015   Lab Results  Component Value Date   VITAMINB12 329 06/03/2014   Lab Results  Component Value Date   TSH 1.580 06/03/2014        ASSESSMENT AND PLAN 75 y.o. year old female  has a past medical history of Anemia; Anxiety; Arthritis; Asthma; Brittle bone disease; Cataract; Complication of anesthesia; Depression; Diabetes mellitus type 2, insulin dependent ([place]); Diabetic neuropathy ([place]); Diastolic dysfunction, left ventricle (11/30/14); DVT (deep venous thrombosis) ([place]); Glaucoma; H/O hiatal hernia; Hyperlipidemia; Hypertension associated with diabetes ([place]); Hypothyroidism; Kidney stones; Memory loss (06/03/2014); Normal cardiac stress test (11/30/14); On home oxygen therapy; Osteoporosis; Palpitations (35years ago); PE (pulmonary thromboembolism) ([place]) (x 3, last one was ~ 2003); Presence of permanent cardiac pacemaker; Restless legs syndrome; Sleep apnea; Spinal headache; Spinal stenosis; Stroke ([place]) (04-19-2013); Symptomatic bradycardia (03/24/15); and TIA (transient ischemic attack) (March 2014). here with:  1. Chronic daily headaches 2. 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 I recommend she increase her amitriptyline dose to 100 mg at night. I also encouraged her to do neck stretching exercises as well as increased participation in stress laxation activities like meditation on a daily basis. I also advised her to see her primary physician to discuss further depression medications. Greater than 50% time during this 25 minute visit was spent on counseling and coordination of care about her headaches and cognitive impairment and depression She will return for follow-up in 3 months with Ward Givens, nurse practitioner or call earlier if necessary.    Antony Contras, MD 01/12/2016, 12:35 PM [facility] [address] [place], [place] [address] [phone]

## 2016-01-19 DIAGNOSIS — D132 Benign neoplasm of duodenum: Secondary | ICD-10-CM | POA: Diagnosis not present

## 2016-01-19 DIAGNOSIS — K644 Residual hemorrhoidal skin tags: Secondary | ICD-10-CM | POA: Diagnosis not present

## 2016-01-19 DIAGNOSIS — K5901 Slow transit constipation: Secondary | ICD-10-CM | POA: Diagnosis not present

## 2016-01-22 DIAGNOSIS — I10 Essential (primary) hypertension: Secondary | ICD-10-CM | POA: Diagnosis not present

## 2016-01-22 DIAGNOSIS — I509 Heart failure, unspecified: Secondary | ICD-10-CM | POA: Diagnosis not present

## 2016-02-11 DIAGNOSIS — H401131 Primary open-angle glaucoma, bilateral, mild stage: Secondary | ICD-10-CM | POA: Diagnosis not present

## 2016-02-11 DIAGNOSIS — H35033 Hypertensive retinopathy, bilateral: Secondary | ICD-10-CM | POA: Diagnosis not present

## 2016-02-11 DIAGNOSIS — E113293 Type 2 diabetes mellitus with mild nonproliferative diabetic retinopathy without macular edema, bilateral: Secondary | ICD-10-CM | POA: Diagnosis not present

## 2016-02-19 DIAGNOSIS — N3281 Overactive bladder: Secondary | ICD-10-CM | POA: Diagnosis not present

## 2016-02-19 DIAGNOSIS — E1140 Type 2 diabetes mellitus with diabetic neuropathy, unspecified: Secondary | ICD-10-CM | POA: Diagnosis not present

## 2016-02-19 DIAGNOSIS — R413 Other amnesia: Secondary | ICD-10-CM | POA: Diagnosis not present

## 2016-02-19 DIAGNOSIS — Z794 Long term (current) use of insulin: Secondary | ICD-10-CM | POA: Diagnosis not present

## 2016-02-19 DIAGNOSIS — I129 Hypertensive chronic kidney disease with stage 1 through stage 4 chronic kidney disease, or unspecified chronic kidney disease: Secondary | ICD-10-CM | POA: Diagnosis not present

## 2016-02-19 DIAGNOSIS — R69 Illness, unspecified: Secondary | ICD-10-CM | POA: Diagnosis not present

## 2016-02-19 DIAGNOSIS — Z950 Presence of cardiac pacemaker: Secondary | ICD-10-CM | POA: Diagnosis not present

## 2016-02-19 DIAGNOSIS — Z6841 Body Mass Index (BMI) 40.0 and over, adult: Secondary | ICD-10-CM | POA: Diagnosis not present

## 2016-02-22 DIAGNOSIS — I10 Essential (primary) hypertension: Secondary | ICD-10-CM | POA: Diagnosis not present

## 2016-02-22 DIAGNOSIS — I509 Heart failure, unspecified: Secondary | ICD-10-CM | POA: Diagnosis not present

## 2016-03-08 DIAGNOSIS — Z794 Long term (current) use of insulin: Secondary | ICD-10-CM | POA: Diagnosis not present

## 2016-03-08 DIAGNOSIS — E118 Type 2 diabetes mellitus with unspecified complications: Secondary | ICD-10-CM | POA: Diagnosis not present

## 2016-03-18 ENCOUNTER — Encounter (INDEPENDENT_AMBULATORY_CARE_PROVIDER_SITE_OTHER): Payer: Self-pay

## 2016-03-18 ENCOUNTER — Encounter: Payer: Self-pay | Admitting: Internal Medicine

## 2016-03-18 ENCOUNTER — Ambulatory Visit (INDEPENDENT_AMBULATORY_CARE_PROVIDER_SITE_OTHER): Payer: Medicare HMO | Admitting: Internal Medicine

## 2016-03-18 VITALS — BP 130/70 | HR 65 | Ht 67.5 in | Wt 283.4 lb

## 2016-03-18 DIAGNOSIS — I519 Heart disease, unspecified: Secondary | ICD-10-CM | POA: Diagnosis not present

## 2016-03-18 DIAGNOSIS — I495 Sick sinus syndrome: Secondary | ICD-10-CM

## 2016-03-18 NOTE — Progress Notes (Signed)
Patient ID: Tara Jackson, female   DOB: 1941-01-25, 76 y.o.   MRN: [id number]      HPI Tara Jackson returns today for followup. She is a pleasant 76 yo woman with obesity, HTN, sinus node dysfunction, s/p PPM insertion, who has been stable in the interim. 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 She denies chest pain or sob. She has had trouble losing weight. Allergies  Allergen Reactions  . Iodine Other (See Comments)    ONLY IV--Makes unconscious  . Iohexol Other (See Comments)     Code: SOB, Desc: cardiac arrest w/ iv contrast, has never used 13 hr prep//alice calhoun, Onset Date: [id number]   . Metoclopramide Hcl Other (See Comments)    suicidal  . Sulfa Antibiotics Hives  . Lactose Intolerance (Gi) Diarrhea  . Lansoprazole Nausea And Vomiting     Current Outpatient Prescriptions  Medication Sig Dispense Refill  . acetaminophen (TYLENOL) 500 MG tablet Take 500 mg by mouth every 6 (six) hours as needed for moderate pain.     Marland Kitchen albuterol (PROVENTIL HFA;VENTOLIN HFA) 108 (90 BASE) MCG/ACT inhaler Inhale 2 puffs into the lungs every 6 (six) hours as needed for wheezing.    . busPIRone (BUSPAR) 10 MG tablet Take 1 tablet (10 mg total) by mouth every morning. 60 tablet 1  . Cholecalciferol (VITAMIN D-3 PO) Take 1 tablet by mouth every morning.     . clonazePAM (KLONOPIN) 0.5 MG tablet Take 0.5-1 mg by mouth 2 (two) times daily. Takes 2 tabs in am and 1 tab in pm    . clopidogrel (PLAVIX) 75 MG tablet Take 75 mg by mouth every morning.    . dicyclomine (BENTYL) 10 MG capsule Take 10 mg by mouth 3 (three) times daily as needed for spasms.     Marland Kitchen esomeprazole (NEXIUM) 40 MG capsule Take 40 mg by mouth daily before breakfast.    . furosemide (LASIX) 40 MG tablet Take 40 mg by mouth daily.    Marland Kitchen glipiZIDE (GLUCOTROL) 10 MG tablet Take 20 mg by mouth 2 (two) times daily before a meal.     . insulin NPH (HUMULIN N,NOVOLIN N) 100 UNIT/ML injection Inject 30 Units into the skin  daily before breakfast. Novolin-N    . insulin regular (NOVOLIN R,HUMULIN R) 100 units/mL injection Inject 20 Units into the skin 3 (three) times daily before meals.    Marland Kitchen levothyroxine (SYNTHROID, LEVOTHROID) 100 MCG tablet Take 100 mcg by mouth every morning.     . lidocaine (LIDODERM) 5 % Place 3 patches onto the skin every other day. Remove & Discard patch within 12 hours or as directed by MD    . Linaclotide (LINZESS) 290 MCG CAPS capsule Take 290 mcg by mouth every morning.     Marland Kitchen LORazepam (ATIVAN) 0.5 MG tablet Take 0.5 mg by mouth daily as needed for anxiety.    . meclizine (ANTIVERT) 12.5 MG tablet Take 12.5 mg by mouth 3 (three) times daily as needed for dizziness.     . polyethylene glycol (MIRALAX / GLYCOLAX) packet Take 17 g by mouth daily as needed for mild constipation.    . potassium chloride (MICRO-K) 10 MEQ CR capsule Take 10 mEq by mouth daily.     . potassium chloride SA (K-DUR,KLOR-CON) 20 MEQ tablet Take 1 tablet (20 mEq total) by mouth 2 (two) times daily. 10 tablet 0  . rosuvastatin (CRESTOR) 20 MG tablet Take 1 tablet (20 mg total) by mouth every  morning. 90 tablet 3  . Sennosides (EX-LAX PO) Take 1 tablet by mouth daily as needed (constipation).     . sodium phosphate (FLEET) enema Place 1 enema rectally once. follow package directions    . sucralfate (CARAFATE) 1 G tablet Take 1 g by mouth daily.     Marland Kitchen terconazole (TERAZOL 7) 0.4 % vaginal cream Place 1 applicator vaginally at bedtime as needed (for yeast infection).     . traMADol (ULTRAM) 50 MG tablet Take 1 tablet (50 mg total) by mouth every 12 (twelve) hours as needed for moderate pain. 30 tablet 0  . valsartan (DIOVAN) 160 MG tablet Take 80 mg by mouth daily.    . nortriptyline (PAMELOR) 50 MG capsule Take 2 capsules (100 mg total) by mouth at bedtime. 60 capsule 0   No current facility-administered medications for this visit.      Past Medical History:  Diagnosis Date  . Anemia   . Anxiety   . Arthritis     . Asthma   . Brittle bone disease   . Cataract   . Complication of anesthesia    hard to wake up after anesthesia, trouble turning head  . Depression   . Diabetes mellitus type 2, insulin dependent ([place])   . Diabetic neuropathy ([place])   . Diastolic dysfunction, left ventricle 11/30/14   EF 55%, grade 1 DD  . DVT (deep venous thrombosis) ([place])    "18 in RLE; 7 LLE prior to PE" (03/24/2015)  . Glaucoma   . H/O hiatal hernia   . Hyperlipidemia   . Hypertension associated with diabetes ([place])   . Hypothyroidism   . Kidney stones    "passed them"  . Memory loss 06/03/2014  . Normal cardiac stress test 11/30/14   Negative Myoview  . On home oxygen therapy    "2L oxygen concentrator @ night"  . Osteoporosis   . Palpitations 35years ago   Cardionet monitor - revealed mostly normal sinus rhythm, sinus bradycardia with first-degree A-V block heart rates mostly in the 50s and 60s with some 70s. No arrhythmias, PVCs or PACs noted.  . PE (pulmonary thromboembolism) ([place]) x 3, last one was ~ 2003   history of recurrent RLE DVT with PE - last PE ~>13 yrs; Maintained on Plavix  . Presence of permanent cardiac pacemaker   . Restless legs syndrome   . Sleep apnea    cpap disontinued; test done 07/14/2008 ordered per  [place]  . Spinal headache   . Spinal stenosis    L 3, L 4 and L 5, and C 1, C 2 and C3  . Stroke [facility]) 04-19-2013   tia and stroke. Weakness Rt hand  . Symptomatic bradycardia 03/24/15   [employer] PPM, Dr. Lovena Le  . TIA (transient ischemic attack) March 2014    ROS:   All systems reviewed and negative except as noted in the HPI.   Past Surgical History:  Procedure Laterality Date  . ABDOMINAL HYSTERECTOMY    . APPENDECTOMY    . BACK SURGERY     steroid inj  . CARDIAC CATHETERIZATION  02/14/2012   no evidence of obstructive coronary disease ,low EDP with normal EF  . CATARACT EXTRACTION W/ INTRAOCULAR LENS  IMPLANT, BILATERAL Bilateral 2000s  . CHOLECYSTECTOMY     . DOPPLER ECHOCARDIOGRAPHY  2014; October 2016   a. EF 88-91%; normal diastolic pressures, no regional wall motion motion abnormalities;; b/ F 55-60%. Normal wall motion. GR 1 DD. No valve lesions  .  EP IMPLANTABLE DEVICE N/A 03/24/2015   [facility] [employer] PPM, Dr. Lovena Le  . ESOPHAGOGASTRODUODENOSCOPY  08/09/2011   Procedure: ESOPHAGOGASTRODUODENOSCOPY (EGD);  Surgeon: Jeryl Columbia, MD;  Location: [facility];  Service: Endoscopy;  Laterality: N/A;  . ESOPHAGOGASTRODUODENOSCOPY (EGD) WITH PROPOFOL N/A 11/12/2013   Procedure: ESOPHAGOGASTRODUODENOSCOPY (EGD) WITH PROPOFOL;  Surgeon: Jeryl Columbia, MD;  Location: [facility];  Service: Endoscopy;  Laterality: N/A;  . GLAUCOMA SURGERY Bilateral 2000s   "laser"  . HOT HEMOSTASIS  08/09/2011   Procedure: HOT HEMOSTASIS (ARGON PLASMA COAGULATION/BICAP);  Surgeon: Jeryl Columbia, MD;  Location: [facility];  Service: Endoscopy;  Laterality: N/A;  . HOT HEMOSTASIS N/A 11/12/2013   Procedure: HOT HEMOSTASIS (ARGON PLASMA COAGULATION/BICAP);  Surgeon: Jeryl Columbia, MD;  Location: [facility];  Service: Endoscopy;  Laterality: N/A;  . INSERT / REPLACE / REMOVE PACEMAKER  03/24/2015  . JOINT REPLACEMENT    . KNEE SURGERY Left done with 2nd left knee replacement   spacer bar placement  . LEFT HEART CATHETERIZATION WITH CORONARY ANGIOGRAM N/A 02/14/2012   Procedure: LEFT HEART CATHETERIZATION WITH CORONARY ANGIOGRAM;  Surgeon: Leonie Man, MD;  Location: [facility] CATH LAB;  Service: Cardiovascular;  Laterality: N/A;  . LEV DOPPLER  05/29/2012   RIGHT EXTREM. NORMAL VENOUS DUPLEX  . NM MYOCAR PERF WALL MOTION  01/26/2012; 11/2014   a. EF 79%,LV normal,ST SEGMENT CHANGE SUGGESTIVE OF ISCHEMIA.; LOW RISK, EF 60%. No ischemia or infarction. No wall motion abnormality   . PACEMAKER PLACEMENT    . REVISION TOTAL KNEE ARTHROPLASTY Left   . TONSILLECTOMY    . TOTAL KNEE ARTHROPLASTY Bilateral   . TRIGGER FINGER RELEASE  11/22/2011   Procedure: RELEASE TRIGGER  FINGER/A-1 PULLEY;  Surgeon: Meredith Pel, MD;  Location: [facility];  Service: Orthopedics;  Laterality: Left;  Left trigger thumb release  . TRIGGER FINGER RELEASE Right yrs ago     Family History  Problem Relation Age of Onset  . Cancer Mother   . Cancer - Prostate Father      Social History   Social History  . Marital status: Married    Spouse name: [name]  . Number of children: 2  . Years of education: college   Occupational History  . retired    Social History Main Topics  . Smoking status: Never Smoker  . Smokeless tobacco: Never Used  . Alcohol use No  . Drug use: No  . Sexual activity: Yes   Other Topics Concern  . Not on file   Social History Narrative   Married [name].) married 76 years.   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         BP 130/70   Pulse 65   Ht 5' 7.5" (1.715 m)   Wt 283 lb 6.4 oz (128.5 kg)   BMI 43.73 kg/m   Physical Exam:  obese appearing 76 yo woman, NAD HEENT: Unremarkable Neck:  No JVD, no thyromegally Lymphatics:  No adenopathy Back:  No CVA tenderness Lungs:  Clear with no wheezes HEART:  Regular rate rhythm, no murmurs, no rubs, no clicks Abd:  soft, positive bowel sounds, no organomegally, no rebound, no guarding Ext:  2 plus pulses, trace peripheral edema, no cyanosis, no clubbing Skin:  No rashes no nodules Neuro:  CN II through XII intact, motor grossly intact  DEVICE  Normal device function.  See PaceArt for details.   Assess/Plan: 1. Sinus node dysfunction - she  is s/p PPM insertion doing well. 2. PPM - her [employer] device is working normally. 3. HTN - she is encouraged to lose weight. She will maintain a low sodium diet.  Mikle Bosworth.D.

## 2016-03-18 NOTE — Patient Instructions (Addendum)
Medication Instructions:  Your physician recommends that you continue on your current medications as directed. Please refer to the Current Medication list given to you today.   Labwork: None Ordered   Testing/Procedures: None Ordered   Follow-Up: Your physician wants you to follow-up in: 1 year with Dr. Lovena Le. You will receive a reminder letter in the mail two months in advance. If you don't receive a letter, please call our office to schedule the follow-up appointment.  Remote monitoring is used to monitor your Pacemaker from home. This monitoring reduces the number of office visits required to check your device to one time per year. It allows [place] to keep an eye on the functioning of your device to ensure it is working properly. You are scheduled for a device check from home on 06/16/16. You may send your transmission at any time that day. If you have a wireless device, the transmission will be sent automatically. After your physician reviews your transmission, you will receive a postcard with your next transmission date.     Any Other Special Instructions Will Be Listed Below (If Applicable).     If you need a refill on your cardiac medications before your next appointment, please call your pharmacy.

## 2016-03-21 LAB — CUP PACEART INCLINIC DEVICE CHECK
Date Time Interrogation Session: 20180212092905
Implantable Lead Implant Date: 20170214
Implantable Lead Implant Date: 20170214
Implantable Lead Location: 753859
Implantable Lead Location: 753860
Implantable Lead Model: 7740
Implantable Lead Model: 7741
Implantable Lead Serial Number: 649859
Implantable Lead Serial Number: 728741
Implantable Pulse Generator Implant Date: 20170214
Pulse Gen Serial Number: 718098

## 2016-03-24 DIAGNOSIS — I10 Essential (primary) hypertension: Secondary | ICD-10-CM | POA: Diagnosis not present

## 2016-03-24 DIAGNOSIS — I509 Heart failure, unspecified: Secondary | ICD-10-CM | POA: Diagnosis not present

## 2016-03-25 DIAGNOSIS — K644 Residual hemorrhoidal skin tags: Secondary | ICD-10-CM | POA: Diagnosis not present

## 2016-03-25 DIAGNOSIS — K5901 Slow transit constipation: Secondary | ICD-10-CM | POA: Diagnosis not present

## 2016-03-25 DIAGNOSIS — D132 Benign neoplasm of duodenum: Secondary | ICD-10-CM | POA: Diagnosis not present

## 2016-04-05 DIAGNOSIS — E559 Vitamin D deficiency, unspecified: Secondary | ICD-10-CM | POA: Diagnosis not present

## 2016-04-05 DIAGNOSIS — E1140 Type 2 diabetes mellitus with diabetic neuropathy, unspecified: Secondary | ICD-10-CM | POA: Diagnosis not present

## 2016-04-05 DIAGNOSIS — E782 Mixed hyperlipidemia: Secondary | ICD-10-CM | POA: Diagnosis not present

## 2016-04-05 DIAGNOSIS — I10 Essential (primary) hypertension: Secondary | ICD-10-CM | POA: Diagnosis not present

## 2016-04-05 DIAGNOSIS — E038 Other specified hypothyroidism: Secondary | ICD-10-CM | POA: Diagnosis not present

## 2016-04-12 ENCOUNTER — Encounter: Payer: Self-pay | Admitting: Adult Health

## 2016-04-12 ENCOUNTER — Ambulatory Visit (INDEPENDENT_AMBULATORY_CARE_PROVIDER_SITE_OTHER): Payer: Medicare HMO | Admitting: Adult Health

## 2016-04-12 VITALS — BP 129/70 | HR 68 | Resp 16 | Ht 67.5 in | Wt 280.0 lb

## 2016-04-12 DIAGNOSIS — Z794 Long term (current) use of insulin: Secondary | ICD-10-CM | POA: Diagnosis not present

## 2016-04-12 DIAGNOSIS — R413 Other amnesia: Secondary | ICD-10-CM | POA: Diagnosis not present

## 2016-04-12 DIAGNOSIS — R51 Headache: Secondary | ICD-10-CM | POA: Diagnosis not present

## 2016-04-12 DIAGNOSIS — F329 Major depressive disorder, single episode, unspecified: Secondary | ICD-10-CM

## 2016-04-12 DIAGNOSIS — E11319 Type 2 diabetes mellitus with unspecified diabetic retinopathy without macular edema: Secondary | ICD-10-CM | POA: Diagnosis not present

## 2016-04-12 DIAGNOSIS — R519 Headache, unspecified: Secondary | ICD-10-CM

## 2016-04-12 DIAGNOSIS — R2689 Other abnormalities of gait and mobility: Secondary | ICD-10-CM | POA: Diagnosis not present

## 2016-04-12 DIAGNOSIS — W19XXXD Unspecified fall, subsequent encounter: Secondary | ICD-10-CM | POA: Diagnosis not present

## 2016-04-12 DIAGNOSIS — E1140 Type 2 diabetes mellitus with diabetic neuropathy, unspecified: Secondary | ICD-10-CM | POA: Diagnosis not present

## 2016-04-12 DIAGNOSIS — R69 Illness, unspecified: Secondary | ICD-10-CM | POA: Diagnosis not present

## 2016-04-12 DIAGNOSIS — Z950 Presence of cardiac pacemaker: Secondary | ICD-10-CM | POA: Diagnosis not present

## 2016-04-12 DIAGNOSIS — H4089 Other specified glaucoma: Secondary | ICD-10-CM | POA: Diagnosis not present

## 2016-04-12 DIAGNOSIS — F32A Depression, unspecified: Secondary | ICD-10-CM

## 2016-04-12 DIAGNOSIS — Z0000 Encounter for general adult medical examination without abnormal findings: Secondary | ICD-10-CM | POA: Diagnosis not present

## 2016-04-12 NOTE — Patient Instructions (Signed)
Can try gabapentin for headaches- 100 mg at bedtime Continue Nortriptyline for now- may reduce this back to 50 mg if we start gabapentin  Gabapentin capsules or tablets What is this medicine? GABAPENTIN (GA ba pen tin) is used to control partial seizures in adults with epilepsy. It is also used to treat certain types of nerve pain. This medicine may be used for other purposes; ask your health care provider or pharmacist if you have questions. COMMON BRAND NAME(S): Active-PAC with Gabapentin, Gabarone, Neurontin What should I tell my health care provider before I take this medicine? They need to know if you have any of these conditions: -kidney disease -suicidal thoughts, plans, or attempt; a previous suicide attempt by you or a family member -an unusual or allergic reaction to gabapentin, other medicines, foods, dyes, or preservatives -pregnant or trying to get pregnant -breast-feeding How should I use this medicine? Take this medicine by mouth with a glass of water. Follow the directions on the prescription label. You can take it with or without food. If it upsets your stomach, take it with food.Take your medicine at regular intervals. Do not take it more often than directed. Do not stop taking except on your doctor's advice. If you are directed to break the 600 or 800 mg tablets in half as part of your dose, the extra half tablet should be used for the next dose. If you have not used the extra half tablet within 28 days, it should be thrown away. A special MedGuide will be given to you by the pharmacist with each prescription and refill. Be sure to read this information carefully each time. Talk to your pediatrician regarding the use of this medicine in children. Special care may be needed. Overdosage: If you think you have taken too much of this medicine contact a poison control center or emergency room at once. NOTE: This medicine is only for you. Do not share this medicine with  others. What if I miss a dose? If you miss a dose, take it as soon as you can. If it is almost time for your next dose, take only that dose. Do not take double or extra doses. What may interact with this medicine? Do not take this medicine with any of the following medications: -other gabapentin products This medicine may also interact with the following medications: -alcohol -antacids -antihistamines for allergy, cough and cold -certain medicines for anxiety or sleep -certain medicines for depression or psychotic disturbances -homatropine; hydrocodone -naproxen -narcotic medicines (opiates) for pain -phenothiazines like chlorpromazine, mesoridazine, prochlorperazine, thioridazine This list may not describe all possible interactions. Give your health care provider a list of all the medicines, herbs, non-prescription drugs, or dietary supplements you use. Also tell them if you smoke, drink alcohol, or use illegal drugs. Some items may interact with your medicine. What should I watch for while using this medicine? Visit your doctor or health care professional for regular checks on your progress. You may want to keep a record at home of how you feel your condition is responding to treatment. You may want to share this information with your doctor or health care professional at each visit. You should contact your doctor or health care professional if your seizures get worse or if you have any new types of seizures. Do not stop taking this medicine or any of your seizure medicines unless instructed by your doctor or health care professional. Stopping your medicine suddenly can increase your seizures or their severity. Wear a medical identification  bracelet or chain if you are taking this medicine for seizures, and carry a card that lists all your medications. You may get drowsy, dizzy, or have blurred vision. Do not drive, use machinery, or do anything that needs mental alertness until you know how  this medicine affects you. To reduce dizzy or fainting spells, do not sit or stand up quickly, especially if you are an older patient. Alcohol can increase drowsiness and dizziness. Avoid alcoholic drinks. Your mouth may get dry. Chewing sugarless gum or sucking hard candy, and drinking plenty of water will help. The use of this medicine may increase the chance of suicidal thoughts or actions. Pay special attention to how you are responding while on this medicine. Any worsening of mood, or thoughts of suicide or dying should be reported to your health care professional right away. Women who become pregnant while using this medicine may enroll in the [place] Pregnancy Registry by calling [phone]. This registry collects information about the safety of antiepileptic drug use during pregnancy. What side effects may I notice from receiving this medicine? Side effects that you should report to your doctor or health care professional as soon as possible: -allergic reactions like skin rash, itching or hives, swelling of the face, lips, or tongue -worsening of mood, thoughts or actions of suicide or dying Side effects that usually do not require medical attention (report to your doctor or health care professional if they continue or are bothersome): -constipation -difficulty walking or controlling muscle movements -dizziness -nausea -slurred speech -tiredness -tremors -weight gain This list may not describe all possible side effects. Call your doctor for medical advice about side effects. You may report side effects to FDA at 1-800-FDA-1088. Where should I keep my medicine? Keep out of reach of children. This medicine may cause accidental overdose and death if it taken by other adults, children, or pets. Mix any unused medicine with a substance like cat litter or coffee grounds. Then throw the medicine away in a sealed container like a sealed bag or a coffee can with a  lid. Do not use the medicine after the expiration date. Store at room temperature between 15 and 30 degrees C (59 and 86 degrees F). NOTE: This sheet is a summary. It may not cover all possible information. If you have questions about this medicine, talk to your doctor, pharmacist, or health care provider.  2018 Elsevier/Gold Standard (2013-03-22 15:26:50)

## 2016-04-12 NOTE — Progress Notes (Signed)
PATIENT: Tara Jackson DOB: May 16, 1940  REASON FOR VISIT: follow up- memory, headaches HISTORY FROM: patient and husband  HISTORY OF PRESENT ILLNESS: HISTORY per Dr. Leonie Man notes: 04/07/15: Tara Jackson is a 76 year old female with a history of headaches, TIA and memory loss. She returns today for follow-up. She reports that today she has a headache. She states that it started approximately 1 month ago and has been daily in nature. She rates her pain a 10 out of 10 today. She describes her discomfort as pulsatile in the temporal region. She does describe a stiff neck. Denies nausea and vomiting. Does have some photophobia. Denies phonophobia. She has tried tramadol 100 mg with no relief. In the past the patient has been on tizanidine (2014) and according to the notes she received good benefit from this medication. The patient does not recall this medication and is unsure why she is no longer taking it-- other than her prescription may have ran out. The patient does feel that her memory is worse. She states that last night she did not remember eating supper. The patient is able to complete most ADLs independently. Although she requires assistance with putting on her shoes and stockings. Her husband also picks out her clothes for her. She no longer does much cooking because she feels dizzy at times. She does not operate a motor vehicle. She continues to do the finances however last month she had some complications however she is going to try again this month. The patient is not currently on any medication for her memory. Her BuSpar was increased at the last visit however the patient is not aware of this. Her husband is with her today at the visit. 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  HISTORY: Tara Jackson is a 76 year old African lady who developed new-onset chronic daily headaches for the last 4 weeks. She states that she had some dental extractions done of her  upper molar teeth about a week ago following which she noticed worsening of her pain which predominantly is in the temporal head regions but does spread all over. She states the pain is constant, severe. At times it is pulsatile. She was seen in the emergency room at [facility] on 07/23/12. She was treated with IV Dilaudid, Phenergan, Zofran and Benadryl in no hopping on the subtotal benefit. CT scan of the maxillary region showed no evidence of abscess associated with a total extraction but showed postoperative changes with communication between the oral cavity and nasal maxillary sinus. There was evidence of sinusitis seen. CT scan of the head showed no acute abnormality. The headache is accompanied by some nausea and light sensitivity and occasional vomiting. The headache remains unchanged throughout the day. She is currently being treated with antibiotic course for a swollen gums. She has tried oxycodone, Ultram and Tylenol which have not helped. She does also have history of degenerative cervical spine disease she does complain of neck pain and muscle tightness and spasm as well. She has had 4 minor falls since her last visit 2 months ago. She even had a brief episode of blacking out yesterday but her husband caught her. She found that her blood pressure was low and her primary care physician has reduced her blood pressure medication since then. She denies any prior history of migraine headaches of similar headaches in the past. She denies any blurred vision or loss of vision accompanying her headaches. 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   UPDATE 11/15/12 (LL): Tara Jackson comes to office for revisit for daily headache after wisdom tooth extraction. MRI cervical spine shows prominent spondylitic changes mostly at C5-6 and C4-5. with lateral disc osteophyte protrusions resulting in moderate  foraminal narrowing on the right and mild canal stenosis without definite compression. MRI brain showed mild changes of microvascular ischemia. Advanced changes of chronic paranasal sinusitis. She states that her headaches are much better, about 84 % better than last visit. She does not have a headache today. She is using Tizanadine 4 mg for neck muscle tension. She had a recent sleep study which showed snoring but AHI within normal limits. Restless legs syndrome was suggested due to frequent leg movements. She states at night sometimes she feels like something is crawling on her lower legs and she feels like she must move them.   UPDATE 09/19/13 (LL): Since last visit Tara Jackson states that she has had some problems with abdominal pain and constipation. She recently went to the ER with abdominal pain and constipation times one week, despite the use of several enemas. Her headaches have not been bad in the last few months. She had a epidural pain injection in May at pain management. She denies any focal TIA or stroke symptoms, but has numerous symptom complaints on review of systems. She is concerned about leg swelling and abdominal swelling, although her weight is up another 21 pounds since last visit. She states she has been having frequent falls without injury, but when she falls it's difficult for her to get up by herself and even difficult for her husband to get her up. She does not exercise and uses a motorized wheelchair his most of the time. Of particular concern today is an increase in hand tremor right greater than left, which is present at rest but more intense with action. It is causing her embarrassment. Update 06/03/2014 : She returns for follow-up after last visit 6 months ago. She is accompanied by husband. Patient reports new complaint of memory difficulties for the last 3 months. She states that this is mainly short-term memory. She cannot remember recent events and conversations and needs to be  reminded multiple times. She often asked the same questions when she was told to answer a short while back. At times she feels that she has trouble completing sentences and can stop in mid sentence and search for what she was trying to say. She has had some episodes of hypoglycemia but feels her memory difficulties and are not related to that. She was prescribed primidone at last visit for tremor but she did not fill the prescription but she remains on Inderal 40 mg which seems to help her tremor quite well. She was hospitalized about 4-6 weeks ago with chest pain and all workup was negative except heart rate was slow. She has long-standing history of depression and does take BuSpar 7.5 mg daily as well as Klonopin but feels her depression is not adequately treated. She has had no recurrent stroke or TIA symptoms. She remains on clopidogrel which is tolerating well without significant bleeding or bruising. She states her blood pressure and cholesterol control have been good Update 04/06/2015 : She returns for follow-up after last visit 6 months ago. She is accompanied by her husband. Patient states that in memory diminished slightly worse and she has trouble remembering  some recent information. However she can still manage to take care of her daily activities by herself. She was recently hospitalized 2 weeks ago and had a pacemaker implanted by Dr. Lovena Le. She continues to have daily headaches but off and on and not constant. The headaches vary in severity from 6/10-10/10. She takes Tylenol which seems to help. On average needs to 3 tablets a day at least. She needs to take tramadol occasionally which doesn't work as well. She has been taking Pamelor 50 mg capsule at night for her remote history of anxiety attacks. She states her blood pressure is well controlled and today it is low at 103/61. She is having trouble controlling her sugars which have fluctuated a bit she has not had any recurrent stroke or TIA  symptoms. She remains on Plavix which is tolerating well without bleeding or bruising.  Update 01/12/2016 : She returns for follow-up after last visit 9 months ago. She is accompanied by husband. Patient states that she had a minor fall about 3 weeks ago and slipped and her head hit a car door. Since then she's been having daily headaches. She describes this as mainly posterior headaches which are mild to moderate in nature. She had been asked to increase her dose of Pamelor to 100 mg at night and last visit but she has not done that. She also admits to having some cognitive difficulties with decreased attention and poor short-term memory and at times having trouble remembering names and completing sentences. She has a lifelong history of depression and the past has tried multiple medications including Zoloft and Wellbutrin and Paxil but currently she is only on Klonopin which she takes for anxiety. She does participate in some stress relaxation activities like meditation daily but is not doing neck stretchin exercises.She is on klonopin for anxiety but not on anything for depression.  Today 04/12/16:  Mrs. Hassan is a 76 year old female with a history of depression, headaches and mild memory disturbance. She returns today for follow-up. Patient reports that her main concern today is ongoing trouble with her memory and depression. She saw her primary care this morning for depression. She reports that she was not given any new medication. He encouraged her to continue her journaling. Patient states that she continues to have a daily headache. She states that she has noticed no change in her headache frequency since increasing nortriptyline. The patient lives at home with her husband. She does notice changes with her memory and depends on her husband for help however she does have mobility issues. She primarily uses a wheelchair or Hoveround. She states that she can ambulate small distances. She uses a lift chair at  home. She returns today for an evaluation.   REVIEW OF SYSTEMS: Out of a complete 14 system review of symptoms, the patient complains only of the following symptoms, and all other reviewed systems are negative.  Appetite change, chills, fatigue, fever, runny nose, eye discharge, light sensitivity, double vision, cough, wheezing, shortness of breath, chest tightness, leg swelling, palpitations, constipation, heat intolerance, frequent waking, itching, rash, neck pain, walking difficulty, back pain, joint pain, joint swelling, urgency, incontinence of bladder, food allergies, anemia, dizziness, headache, speech difficulty, behavior problem, depression  ALLERGIES: Allergies  Allergen Reactions  . Iodine Other (See Comments)    ONLY IV--Makes unconscious  . Iohexol Other (See Comments)     Code: SOB, Desc: cardiac arrest w/ iv contrast, has never used 13 hr prep//alice calhoun, Onset Date: [id number]   . Metoclopramide  Hcl Other (See Comments)    suicidal  . Sulfa Antibiotics Hives  . Lactose Intolerance (Gi) Diarrhea  . Lansoprazole Nausea And Vomiting    HOME MEDICATIONS: Outpatient Medications Prior to Visit  Medication Sig Dispense Refill  . acetaminophen (TYLENOL) 500 MG tablet Take 500 mg by mouth every 6 (six) hours as needed for moderate pain.     Marland Kitchen albuterol (PROVENTIL HFA;VENTOLIN HFA) 108 (90 BASE) MCG/ACT inhaler Inhale 2 puffs into the lungs every 6 (six) hours as needed for wheezing.    . busPIRone (BUSPAR) 10 MG tablet Take 1 tablet (10 mg total) by mouth every morning. 60 tablet 1  . Cholecalciferol (VITAMIN D-3 PO) Take 1 tablet by mouth every morning.     . clonazePAM (KLONOPIN) 0.5 MG tablet Take 0.5-1 mg by mouth 2 (two) times daily. Takes 2 tabs in am and 1 tab in pm    . clopidogrel (PLAVIX) 75 MG tablet Take 75 mg by mouth every morning.    . dicyclomine (BENTYL) 10 MG capsule Take 10 mg by mouth 3 (three) times daily as needed for spasms.     Marland Kitchen esomeprazole (NEXIUM)  40 MG capsule Take 40 mg by mouth daily before breakfast.    . furosemide (LASIX) 40 MG tablet Take 40 mg by mouth daily.    Marland Kitchen glipiZIDE (GLUCOTROL) 10 MG tablet Take 20 mg by mouth 2 (two) times daily before a meal.     . insulin NPH (HUMULIN N,NOVOLIN N) 100 UNIT/ML injection Inject 30 Units into the skin daily before breakfast. Novolin-N    . insulin regular (NOVOLIN R,HUMULIN R) 100 units/mL injection Inject 20 Units into the skin 3 (three) times daily before meals.    Marland Kitchen levothyroxine (SYNTHROID, LEVOTHROID) 100 MCG tablet Take 100 mcg by mouth every morning.     . lidocaine (LIDODERM) 5 % Place 3 patches onto the skin every other day. Remove & Discard patch within 12 hours or as directed by MD    . LORazepam (ATIVAN) 0.5 MG tablet Take 0.5 mg by mouth daily as needed for anxiety.    . meclizine (ANTIVERT) 12.5 MG tablet Take 12.5 mg by mouth 3 (three) times daily as needed for dizziness.     . polyethylene glycol (MIRALAX / GLYCOLAX) packet Take 17 g by mouth daily as needed for mild constipation.    . potassium chloride (MICRO-K) 10 MEQ CR capsule Take 10 mEq by mouth daily.     . potassium chloride SA (K-DUR,KLOR-CON) 20 MEQ tablet Take 1 tablet (20 mEq total) by mouth 2 (two) times daily. 10 tablet 0  . rosuvastatin (CRESTOR) 20 MG tablet Take 1 tablet (20 mg total) by mouth every morning. 90 tablet 3  . Sennosides (EX-LAX PO) Take 1 tablet by mouth daily as needed (constipation).     . sodium phosphate (FLEET) enema Place 1 enema rectally once. follow package directions    . sucralfate (CARAFATE) 1 G tablet Take 1 g by mouth daily.     Marland Kitchen terconazole (TERAZOL 7) 0.4 % vaginal cream Place 1 applicator vaginally at bedtime as needed (for yeast infection).     . traMADol (ULTRAM) 50 MG tablet Take 1 tablet (50 mg total) by mouth every 12 (twelve) hours as needed for moderate pain. 30 tablet 0  . valsartan (DIOVAN) 160 MG tablet Take 80 mg by mouth daily.    . nortriptyline (PAMELOR) 50 MG  capsule Take 2 capsules (100 mg total) by mouth at bedtime. [place]  capsule 0  . Linaclotide (LINZESS) 290 MCG CAPS capsule Take 290 mcg by mouth every morning.      No facility-administered medications prior to visit.     PAST MEDICAL HISTORY: Past Medical History:  Diagnosis Date  . Anemia   . Anxiety   . Arthritis   . Asthma   . Brittle bone disease   . Cataract   . Complication of anesthesia    hard to wake up after anesthesia, trouble turning head  . Depression   . Diabetes mellitus type 2, insulin dependent ([place])   . Diabetic neuropathy ([place])   . Diastolic dysfunction, left ventricle 11/30/14   EF 55%, grade 1 DD  . DVT (deep venous thrombosis) ([place])    "18 in RLE; 7 LLE prior to PE" (03/24/2015)  . Glaucoma   . H/O hiatal hernia   . Hyperlipidemia   . Hypertension associated with diabetes ([place])   . Hypothyroidism   . Kidney stones    "passed them"  . Memory loss 06/03/2014  . Normal cardiac stress test 11/30/14   Negative Myoview  . On home oxygen therapy    "2L oxygen concentrator @ night"  . Osteoporosis   . Palpitations 35years ago   Cardionet monitor - revealed mostly normal sinus rhythm, sinus bradycardia with first-degree A-V block heart rates mostly in the 50s and 60s with some 70s. No arrhythmias, PVCs or PACs noted.  . 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 Presence of permanent cardiac pacemaker   . Restless legs syndrome   . Sleep apnea    cpap disontinued; test done 07/14/2008 ordered per  [place]  . Spinal headache   . Spinal stenosis    L 3, L 4 and L 5, and C 1, C 2 and C3  . Stroke [facility]) 04-19-2013   tia and stroke. Weakness Rt hand  . Symptomatic bradycardia 03/24/15   [employer] PPM, Dr. Lovena Le  . TIA (transient ischemic attack) March 2014    PAST SURGICAL HISTORY: Past Surgical History:  Procedure Laterality Date  . ABDOMINAL  HYSTERECTOMY    . APPENDECTOMY    . BACK SURGERY     steroid inj  . CARDIAC CATHETERIZATION  02/14/2012   no evidence of obstructive coronary disease ,low EDP with normal EF  . CATARACT EXTRACTION W/ INTRAOCULAR LENS  IMPLANT, BILATERAL Bilateral 2000s  . CHOLECYSTECTOMY    . DOPPLER ECHOCARDIOGRAPHY  2014; October 2016   a. EF 24-40%; normal diastolic pressures, no regional wall motion motion abnormalities;; b/ F 55-60%. Normal wall motion. GR 1 DD. No valve lesions  . EP IMPLANTABLE DEVICE N/A 03/24/2015   [facility] PPM, Dr. Lovena Le  . ESOPHAGOGASTRODUODENOSCOPY  08/09/2011   Procedure: ESOPHAGOGASTRODUODENOSCOPY (EGD);  Surgeon: Jeryl Columbia, MD;  Location: [facility];  Service: Endoscopy;  Laterality: N/A;  . ESOPHAGOGASTRODUODENOSCOPY (EGD) WITH PROPOFOL N/A 11/12/2013   Procedure: ESOPHAGOGASTRODUODENOSCOPY (EGD) WITH PROPOFOL;  Surgeon: Jeryl Columbia, MD;  Location: [facility] ENDOSCOPY;  Service: Endoscopy;  Laterality: N/A;  . GLAUCOMA SURGERY Bilateral 2000s   "laser"  . HOT HEMOSTASIS  08/09/2011   Procedure: HOT HEMOSTASIS (ARGON PLASMA COAGULATION/BICAP);  Surgeon: Jeryl Columbia, MD;  Location: [facility];  Service: Endoscopy;  Laterality: N/A;  . HOT HEMOSTASIS N/A 11/12/2013   Procedure: HOT HEMOSTASIS (ARGON PLASMA COAGULATION/BICAP);  Surgeon: Jeryl Columbia, MD;  Location:  [facility] ENDOSCOPY;  Service: Endoscopy;  Laterality: N/A;  . INSERT / REPLACE / REMOVE PACEMAKER  03/24/2015  . JOINT REPLACEMENT    . KNEE SURGERY Left done with 2nd left knee replacement   spacer bar placement  . LEFT HEART CATHETERIZATION WITH CORONARY ANGIOGRAM N/A 02/14/2012   Procedure: LEFT HEART CATHETERIZATION WITH CORONARY ANGIOGRAM;  Surgeon: Leonie Man, MD;  Location: [facility] CATH LAB;  Service: Cardiovascular;  Laterality: N/A;  . LEV DOPPLER  05/29/2012   RIGHT EXTREM. NORMAL VENOUS DUPLEX  . NM MYOCAR PERF WALL MOTION  01/26/2012; 11/2014   a. EF 79%,LV normal,ST SEGMENT CHANGE SUGGESTIVE OF  ISCHEMIA.; LOW RISK, EF 60%. No ischemia or infarction. No wall motion abnormality   . PACEMAKER PLACEMENT    . REVISION TOTAL KNEE ARTHROPLASTY Left   . TONSILLECTOMY    . TOTAL KNEE ARTHROPLASTY Bilateral   . TRIGGER FINGER RELEASE  11/22/2011   Procedure: RELEASE TRIGGER FINGER/A-1 PULLEY;  Surgeon: Meredith Pel, MD;  Location: [facility];  Service: Orthopedics;  Laterality: Left;  Left trigger thumb release  . TRIGGER FINGER RELEASE Right yrs ago    FAMILY HISTORY: Family History  Problem Relation Age of Onset  . Cancer Mother   . Cancer - Prostate Father     SOCIAL HISTORY: Social History   Social History  . Marital status: Married    Spouse name: [name]  . Number of children: 2  . Years of education: college   Occupational History  . retired    Social History Main Topics  . Smoking status: Never Smoker  . Smokeless tobacco: Never Used  . Alcohol use No  . Drug use: No  . Sexual activity: Yes   Other Topics Concern  . Not on file   Social History Narrative   Married [name].) married 86 years.   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          PHYSICAL EXAM  Vitals:   04/12/16 1359  Resp: 16  Weight: 280 lb (127 kg)  Height: 5' 7.5" (1.715 m)   Body mass index is 43.21 kg/m.   MMSE - Mini Mental State Exam 04/12/2016 07/09/2015 10/07/2014  Orientation to time 5 5 5   Orientation to Place 5 5 5   Registration 3 3 3   Attention/ Calculation 5 5 5   Recall 3 2 3   Language- name 2 objects 2 2 2   Language- repeat 1 1 1   Language- follow 3 step command 3 3 2   Language- read & follow direction 1 1 1   Write a sentence 1 1 1   Copy design 1 1 1   Total score 30 29 29      Generalized: Well developed, in no acute distress   Neurological examination  Mentation: Alert oriented to time, place, history taking. Follows all commands speech and language fluent Cranial nerve II-XII: Pupils were equal round reactive to light.  Extraocular movements were full, visual field were full on confrontational test. Facial sensation and strength were normal. Uvula tongue midline. Head turning and shoulder shrug  were normal and symmetric. Motor: The motor testing reveals 5 over 5 strength in the upper extremity. 4/5 strength in the lower extremities. Good symmetric motor tone is noted throughout.  Sensory: Sensory testing is intact to soft touch on all 4 extremities. No evidence of extinction is noted.  Coordination: Cerebellar testing reveals good finger-nose-finger and heel-to-shin bilaterally.  Gait and station: Patient is in a wheelchair.  Reflexes: Deep tendon reflexes are symmetric and normal bilaterally.   DIAGNOSTIC DATA (LABS, IMAGING, TESTING) - I reviewed patient records, labs, notes, testing and imaging myself where available.  Lab Results  Component Value Date   WBC 6.1 06/22/2015   HGB 11.1 (L) 06/22/2015   HCT 34.7 (L) 06/22/2015   MCV 93.5 06/22/2015   PLT 252 06/22/2015      Component Value Date/Time   NA 139 06/22/2015 1816   K 2.7 (LL) 06/22/2015 1816   CL 98 (L) 06/22/2015 1816   CO2 29 06/22/2015 1816   GLUCOSE 209 (H) 06/22/2015 1816   BUN 11 06/22/2015 1816   CREATININE 1.76 (H) 06/22/2015 1816   CREATININE 1.64 (H) 04/09/2015 1438   CALCIUM 8.5 (L) 06/22/2015 1816   PROT 6.5 06/12/2015 0620   ALBUMIN 3.3 (L) 06/12/2015 0620   AST 19 06/12/2015 0620   ALT 10 (L) 06/12/2015 0620   ALKPHOS 52 06/12/2015 0620   BILITOT 0.4 06/12/2015 0620   GFRNONAA 27 (L) 06/22/2015 1816   GFRAA 31 (L) 06/22/2015 1816   Lab Results  Component Value Date   CHOL 125 04/20/2012   HDL 49 04/20/2012   LDLCALC 42 04/20/2012   TRIG 168 (H) 04/20/2012   CHOLHDL 2.6 04/20/2012   Lab Results  Component Value Date   HGBA1C 8.6 (H) 03/24/2015   Lab Results  Component Value Date   VITAMINB12 329 06/03/2014   Lab Results  Component Value Date   TSH 1.580 06/03/2014      ASSESSMENT AND PLAN 76 y.o.  year old female  has a past medical history of Anemia; Anxiety; Arthritis; Asthma; Brittle bone disease; Cataract; Complication of anesthesia; Depression; Diabetes mellitus type 2, insulin dependent ([place]); Diabetic neuropathy ([place]); Diastolic dysfunction, left ventricle (11/30/14); DVT (deep venous thrombosis) ([place]); Glaucoma; H/O hiatal hernia; Hyperlipidemia; Hypertension associated with diabetes ([place]); Hypothyroidism; Kidney stones; Memory loss (06/03/2014); Normal cardiac stress test (11/30/14); On home oxygen therapy; Osteoporosis; Palpitations (35years ago); PE (pulmonary thromboembolism) ([place]) (x 3, last one was ~ 2003); Presence of permanent cardiac pacemaker; Restless legs syndrome; Sleep apnea; Spinal headache; Spinal stenosis; Stroke ([place]) (04-19-2013); Symptomatic bradycardia (03/24/15); and TIA (transient ischemic attack) (March 2014). here with:  1. Chronic daily headache. 2. Depression 3. Memory disturbance  The patient is having daily headaches. No benefit with nortriptyline. I suggested that we could try gabapentin. The patient states that she is scheduled for an endoscopy. She reports that she does not want to start any new medication until she has this test done. Her husband is also questioning if she could try meloxicam short-term to help her headaches. Again the patient wants to wait until after endoscopy. I am amendable to this plan. The patient's memory score is normal. I suspect that most of her memory trouble is related to ongoing depression. Patient advised that if her symptoms worsen or she develops new symptoms she should let us know. She will follow-up in 6 months or sooner if needed.     Ward Givens, MSN, NP-C 04/12/2016, 2:05 PM [facility] [address] [place], [place] [address] [phone]

## 2016-04-14 ENCOUNTER — Ambulatory Visit: Payer: Medicare HMO | Admitting: Student

## 2016-04-15 ENCOUNTER — Ambulatory Visit (INDEPENDENT_AMBULATORY_CARE_PROVIDER_SITE_OTHER): Payer: Medicare HMO | Admitting: Student

## 2016-04-15 ENCOUNTER — Encounter: Payer: Self-pay | Admitting: Student

## 2016-04-15 VITALS — BP 138/68 | HR 62 | Ht 67.5 in | Wt 281.6 lb

## 2016-04-15 DIAGNOSIS — Z86718 Personal history of other venous thrombosis and embolism: Secondary | ICD-10-CM

## 2016-04-15 DIAGNOSIS — I119 Hypertensive heart disease without heart failure: Secondary | ICD-10-CM | POA: Diagnosis not present

## 2016-04-15 DIAGNOSIS — R0781 Pleurodynia: Secondary | ICD-10-CM

## 2016-04-15 DIAGNOSIS — Z01818 Encounter for other preprocedural examination: Secondary | ICD-10-CM | POA: Diagnosis not present

## 2016-04-15 DIAGNOSIS — E785 Hyperlipidemia, unspecified: Secondary | ICD-10-CM

## 2016-04-15 DIAGNOSIS — I495 Sick sinus syndrome: Secondary | ICD-10-CM | POA: Diagnosis not present

## 2016-04-15 DIAGNOSIS — R05 Cough: Secondary | ICD-10-CM | POA: Diagnosis not present

## 2016-04-15 DIAGNOSIS — R058 Other specified cough: Secondary | ICD-10-CM

## 2016-04-15 DIAGNOSIS — R509 Fever, unspecified: Secondary | ICD-10-CM

## 2016-04-15 NOTE — Progress Notes (Signed)
Cardiology Office Note    Date:  04/15/2016   ID:  Karson, Chicas [birth year], MRN [id number]  PCP:  [name], MD  Cardiologist: Dr. Ellyn Hack Primary Electrophysiologist: Dr. Lovena Le   Chief Complaint  Patient presents with  . Follow-up    Chest Pain    History of Present Illness:    Tara Jackson is a 76 y.o. female with past medical history of PE/DVT, sinus node dysfunction (s/p PPM placement in 03/2015) HTN, HLD, and normal cors by cath in 2014 who presents to the office today for routine follow-up.   Last seen by Dr. Ellyn Hack in 06/2015 and denied any recent chest discomfort or acute worsening dyspnea. Is on chronic home O2. Seen by Dr. Lovena Le in 03/2016 and denied any recent chest discomfort or palpitations. Device interrogation showed normal device function with no mode switches or high ventricular rates noted.   In talking with the patient today, she reports having episodes of sternal chest discomfort for the past 2 weeks. She has been experiencing a productive cough, fevers, and chills for the past week. The sternal chest discomfort is made worse when lying down at night to go to sleep or when she is coughing. Lasts for 30 seconds up to 2 minutes then spontaneously resolves. She denies any associated dyspnea, nausea, vomiting, diaphoresis, lightheadedness, or presyncope.  She uses an electronic wheelchair for transportation and ambulates a minimal amount around the house.   Was recently seen by her PCP within the past month and reports cholesterol levels were checked at that time and within a normal range.   She remains on Plavix due to a history of PE/DVT. Had been on Coumadin in the past but this was discontinued to elevated LFT's. She says her current pain today does not resemble her prior symptoms of PE.    Past Medical History:  Diagnosis Date  . Anemia   . Anxiety   . Arthritis   . Asthma   . Brittle bone disease   . Cataract   . Chest pain    a. minimal  CAD by cath in 2014 with 40% RI stenosis. b. low-risk NST in 11/2014.  Marland Kitchen Complication of anesthesia    hard to wake up after anesthesia, trouble turning head  . Depression   . Diabetes mellitus type 2, insulin dependent ([place])   . Diabetic neuropathy ([place])   . Diastolic dysfunction, left ventricle 11/30/14   EF 55%, grade 1 DD  . DVT (deep venous thrombosis) ([place])    "18 in RLE; 7 LLE prior to PE" (03/24/2015)  . Glaucoma   . H/O hiatal hernia   . Hyperlipidemia   . Hypertension associated with diabetes ([place])   . Hypothyroidism   . Kidney stones    "passed them"  . Memory loss 06/03/2014  . Normal cardiac stress test 11/30/14   Negative Myoview  . On home oxygen therapy    "2L oxygen concentrator @ night"  . Osteoporosis   . Palpitations 35years ago   Cardionet monitor - revealed mostly normal sinus rhythm, sinus bradycardia with first-degree A-V block heart rates mostly in the 50s and 60s with some 70s. No arrhythmias, PVCs or PACs noted.  . 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 Presence of permanent cardiac pacemaker   . Restless legs syndrome   . Sleep apnea  cpap disontinued; test done 07/14/2008 ordered per  [place]  . Spinal headache   . Spinal stenosis    L 3, L 4 and L 5, and C 1, C 2 and C3  . Stroke [facility]) 04-19-2013   tia and stroke. Weakness Rt hand  . 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 s/p [employer] PPM in 03/2015 by Dr. Lovena Le secondary to sinus node dysfunction.   Marland Kitchen TIA (transient ischemic attack) March 2014    Past Surgical History:  Procedure Laterality Date  . ABDOMINAL HYSTERECTOMY    . APPENDECTOMY    . BACK SURGERY     steroid inj  . CARDIAC CATHETERIZATION  02/14/2012   no evidence of obstructive coronary disease ,low EDP with normal EF  . CATARACT EXTRACTION W/ INTRAOCULAR LENS  IMPLANT, BILATERAL Bilateral 2000s  . CHOLECYSTECTOMY     . DOPPLER ECHOCARDIOGRAPHY  2014; October 2016   a. EF 66-06%; normal diastolic pressures, no regional wall motion motion abnormalities;; b/ F 55-60%. Normal wall motion. GR 1 DD. No valve lesions  . EP IMPLANTABLE DEVICE N/A 03/24/2015   [facility] Scientific PPM, Dr. Lovena Le  . ESOPHAGOGASTRODUODENOSCOPY  08/09/2011   Procedure: ESOPHAGOGASTRODUODENOSCOPY (EGD);  Surgeon: Jeryl Columbia, MD;  Location: [facility];  Service: Endoscopy;  Laterality: N/A;  . ESOPHAGOGASTRODUODENOSCOPY (EGD) WITH PROPOFOL N/A 11/12/2013   Procedure: ESOPHAGOGASTRODUODENOSCOPY (EGD) WITH PROPOFOL;  Surgeon: Jeryl Columbia, MD;  Location: [facility];  Service: Endoscopy;  Laterality: N/A;  . GLAUCOMA SURGERY Bilateral 2000s   "laser"  . HOT HEMOSTASIS  08/09/2011   Procedure: HOT HEMOSTASIS (ARGON PLASMA COAGULATION/BICAP);  Surgeon: Jeryl Columbia, MD;  Location: [facility];  Service: Endoscopy;  Laterality: N/A;  . HOT HEMOSTASIS N/A 11/12/2013   Procedure: HOT HEMOSTASIS (ARGON PLASMA COAGULATION/BICAP);  Surgeon: Jeryl Columbia, MD;  Location: [facility];  Service: Endoscopy;  Laterality: N/A;  . INSERT / REPLACE / REMOVE PACEMAKER  03/24/2015  . JOINT REPLACEMENT    . KNEE SURGERY Left done with 2nd left knee replacement   spacer bar placement  . LEFT HEART CATHETERIZATION WITH CORONARY ANGIOGRAM N/A 02/14/2012   Procedure: LEFT HEART CATHETERIZATION WITH CORONARY ANGIOGRAM;  Surgeon: Leonie Man, MD;  Location[facility] C[facility] LAB;  Service: Cardiovascular;  Laterality: N/A;  . LEV DOPPLER  05/29/2012   RIGHT EXTREM. NORMAL VENOUS DUPLEX  . NM MYOCAR PERF WALL MOTION  01/26/2012; 11/2014   a. EF 79%,LV normal,ST SEGMENT CHANGE SUGGESTIVE OF ISCHEMIA.; LOW RISK, EF 60%. No ischemia or infarction. No wall motion abnormality   . PACEMAKER PLACEMENT    . REVISION TOTAL KNEE ARTHROPLASTY Left   . TONSILLECTOMY    . TOTAL KNEE ARTHROPLASTY Bilateral   . TRIGGER FINGER RELEASE  11/22/2011   Procedure: RELEASE TRIGGER  FINGER/A-1 PULLEY;  Surgeon: Meredith Pel, MD;  Location: [facility];  Service: Orthopedics;  Laterality: Left;  Left trigger thumb release  . TRIGGER FINGER RELEASE Right yrs ago    Current Medications: Outpatient Medications Prior to Visit  Medication Sig Dispense Refill  . acetaminophen (TYLENOL) 500 MG tablet Take 500 mg by mouth every 6 (six) hours as needed for moderate pain.     Marland Kitchen albuterol (PROVENTIL HFA;VENTOLIN HFA) 108 (90 BASE) MCG/ACT inhaler Inhale 2 puffs into the lungs every 6 (six) hours as needed for wheezing.    . busPIRone (BUSPAR) 10 MG tablet Take 1 tablet (10 mg total) by mouth every morning. 60 tablet 1  . Cholecalciferol (VITAMIN D-3 PO) Take 1  tablet by mouth every morning.     . clonazePAM (KLONOPIN) 0.5 MG tablet Take 0.5-1 mg by mouth 2 (two) times daily. Takes 2 tabs in am and 1 tab in pm    . clopidogrel (PLAVIX) 75 MG tablet Take 75 mg by mouth every morning.    . dicyclomine (BENTYL) 10 MG capsule Take 10 mg by mouth 3 (three) times daily as needed for spasms.     Marland Kitchen esomeprazole (NEXIUM) 40 MG capsule Take 40 mg by mouth daily before breakfast.    . furosemide (LASIX) 40 MG tablet Take 40 mg by mouth daily.    Marland Kitchen glipiZIDE (GLUCOTROL) 10 MG tablet Take 20 mg by mouth 2 (two) times daily before a meal.     . insulin NPH (HUMULIN N,NOVOLIN N) 100 UNIT/ML injection Inject 30 Units into the skin daily before breakfast. Novolin-N    . insulin regular (NOVOLIN R,HUMULIN R) 100 units/mL injection Inject 20 Units into the skin 3 (three) times daily before meals.    Marland Kitchen levothyroxine (SYNTHROID, LEVOTHROID) 100 MCG tablet Take 100 mcg by mouth every morning.     . lidocaine (LIDODERM) 5 % Place 3 patches onto the skin every other day. Remove & Discard patch within 12 hours or as directed by MD    . LORazepam (ATIVAN) 0.5 MG tablet Take 0.5 mg by mouth daily as needed for anxiety.    . meclizine (ANTIVERT) 12.5 MG tablet Take 12.5 mg by mouth 3 (three) times daily as needed  for dizziness.     Marland Kitchen Plecanatide (TRULANCE PO) Take 1 capsule by mouth daily.     . polyethylene glycol (MIRALAX / GLYCOLAX) packet Take 17 g by mouth daily as needed for mild constipation.    . potassium chloride (MICRO-K) 10 MEQ CR capsule Take 10 mEq by mouth daily.     . potassium chloride SA (K-DUR,KLOR-CON) 20 MEQ tablet Take 1 tablet (20 mEq total) by mouth 2 (two) times daily. 10 tablet 0  . rosuvastatin (CRESTOR) 20 MG tablet Take 1 tablet (20 mg total) by mouth every morning. 90 tablet 3  . Sennosides (EX-LAX PO) Take 1 tablet by mouth daily as needed (constipation).     . sodium phosphate (FLEET) enema Place 1 enema rectally once. follow package directions    . sucralfate (CARAFATE) 1 G tablet Take 1 g by mouth daily.     Marland Kitchen terconazole (TERAZOL 7) 0.4 % vaginal cream Place 1 applicator vaginally at bedtime as needed (for yeast infection).     . traMADol (ULTRAM) 50 MG tablet Take 1 tablet (50 mg total) by mouth every 12 (twelve) hours as needed for moderate pain. 30 tablet 0  . valsartan (DIOVAN) 160 MG tablet Take 80 mg by mouth daily.    . nortriptyline (PAMELOR) 50 MG capsule Take 2 capsules (100 mg total) by mouth at bedtime. 60 capsule 0   No facility-administered medications prior to visit.      Allergies:   Iodine; Iohexol; Metoclopramide hcl; Sulfa antibiotics; Lactose intolerance (gi); and Lansoprazole   Social History   Social History  . Marital status: Married    Spouse name: [name]  . Number of children: 2  . Years of education: college   Occupational History  . retired    Social History Main Topics  . Smoking status: Never Smoker  . Smokeless tobacco: Never Used  . Alcohol use No  . Drug use: No  . Sexual activity: Yes   Other Topics Concern  .  None   Social History Narrative   Married [name].) married 53 years.   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         Family History:  The patient's family history  includes Cancer in her mother; Cancer - Prostate in her father.   Review of Systems:   Please see the history of present illness.     General:  No night sweats or weight changes. Positive for fever and chills.  Cardiovascular:  No dyspnea on exertion, edema, orthopnea, palpitations, paroxysmal nocturnal dyspnea. Positive for chest pain.  Dermatological: No rash, lesions/masses Respiratory: No dyspnea. Positive for productive cough.  Urologic: No hematuria, dysuria Abdominal:   No nausea, vomiting, diarrhea, bright red blood per rectum, melena, or hematemesis Neurologic:  No visual changes, wkns, changes in mental status. All other systems reviewed and are otherwise negative except as noted above.   Physical Exam:    VS:  BP 138/68   Pulse 62   Ht 5' 7.5" (1.715 m)   Wt 281 lb 9.6 oz (127.7 kg)   BMI 43.45 kg/m    General: Well developed, obese African American female appearing in no acute distress. Head: Normocephalic, atraumatic, sclera non-icteric, no xanthomas, nares are without discharge.  Neck: No carotid bruits. JVD not elevated.  Lungs: Respirations regular and unlabored, without wheezes or rales.  Heart: Regular rate and rhythm. No S3 or S4.  No murmur, no rubs, or gallops appreciated. Tender to palpation along sternum.  Abdomen: Soft, non-tender, non-distended with normoactive bowel sounds. No hepatomegaly. No rebound/guarding. No obvious abdominal masses. Msk:  Strength and tone appear normal for age. No joint deformities or effusions. Extremities: No clubbing or cyanosis. No edema.  Distal pedal pulses are 2+ bilaterally. Neuro: Alert and oriented X 3. Moves all extremities spontaneously. No focal deficits noted. Psych:  Responds to questions appropriately with a normal affect. Skin: No rashes or lesions noted  Wt Readings from Last 3 Encounters:  04/15/16 281 lb 9.6 oz (127.7 kg)  04/12/16 280 lb (127 kg)  03/18/16 283 lb 6.4 oz (128.5 kg)     Studies/Labs  Reviewed:   EKG:  EKG is ordered today.  The ekg ordered today demonstrates NSR with 1st degree AV Block, HR 62. No acute ST or T-wave changes when compared to prior tracings.   Recent Labs: 06/12/2015: ALT 10 06/22/2015: B Natriuretic Peptide 70.1; BUN 11; Creatinine, Ser 1.76; Hemoglobin 11.1; Magnesium 1.4; Platelets 252; Potassium 2.7; Sodium 139   Lipid Panel    Component Value Date/Time   CHOL 125 04/20/2012 0514   TRIG 168 (H) 04/20/2012 0514   HDL 49 04/20/2012 0514   CHOLHDL 2.6 04/20/2012 0514   VLDL 34 04/20/2012 0514   LDLCALC 42 04/20/2012 0514    Additional studies/ records that were reviewed today include:   NST: 11/2014 IMPRESSION: 1. No significant myocardial perfusion defects identified.  2. Normal left ventricular wall motion.  3. Left ventricular ejection fraction 60%  4. Low-risk stress test findings*.  Cardiac Catheterization: 02/14/2012 Hemodynamics:  Central Aortic / Mean Pressures: 120/60 mmHg; 80 mmHg  Left Ventricular Pressures / EDP: 120/1 mmHg; 1 mmHg  Left Ventriculography:  Not done  Coronary Anatomy:  Left Main:  Large caliber vessel trifurcates after long possible segment into the the LAD, Ramus Intermedius, and Circumflex  LAD:  Large-caliber vessel that tapers into a relatively small vessel before reaching the apex. There is diffuse mild luminal irregularities. The  main branch from this vessel is a large septal trunk and several smaller septals. Gross small diagonal branches but nothing significant.  Left Circumflex:  Moderate-to- large-caliber vessel that is nondominant. There is a small proximal OM branch before it gives off the AV groove circumflex. It then terminates in a lateral OM that gives off 2 branches. The AV groove circumflex place gives off a atrial branch. There mild luminal irregularities throughout but nothing significant.  Ramus intermedius:  Large-caliber vessel that is at least as big as the LAD and essentially  serves the purposes of the obtuse marginal and diagonal branches. It gives off a very proximal branch that is serves as a diagonal distribution as well as next branch which is a more OM that distribution. As a small branch with the long tubular 40% stenosis. The ramus that continues on normal to the apex. There is mild luminal irregularities may be a 40% lesion in the midportion as long tubular. Nothing significant explain the ischemia.   RCA:  Large caliber dominant vessel with 1 small and 1 moderate caliber RV marginal branches. The vessel then bifurcates distally into the posterior descending artery and the right posterior AV groove branch. There is minimal luminal irregularities throughout.  RPDA:  At least moderate caliber vessel it reaches all again the apex. Mild luminal irregularities.  RPL Sysytem:The Right Posterior AV Groove Branch  is a moderate caliber vessel that gives off 2 small to moderate posterior lateral branches within the most distal one extending and all the way to the anterolateral wall. No evidence of any stenosis explain inferolateral ischemia   Assessment:    1. Pleuritic chest pain   2. Fever, unspecified fever cause   3. Productive cough   4. Sinus node dysfunction (HCC)   5. Hypertensive heart disease without heart failure   6. PULMONARY EMBOLISM, HX OF   7. Hyperlipidemia with target LDL less than 130   8. Preoperative clearance      Plan:   In order of problems listed above:  1. Pleuritic Chest Pain - reports episodes of sternal chest discomfort for the past 2 weeks which is made worse when lying down at night to go to sleep or when she is coughing. Lasts for 30 seconds up to 2 minutes then spontaneously resolves.  - she is tender to palpitation along her sternum on examination. EKG is without acute ischemic changes.  - cath in 2014 showed minimal CAD with 40% RI stenosis and recent NST in 11/2014 was low-risk with no evidence of ischemia.  - overall,  her pain appears atypical for a cardiac etiology and most consistent with pleuritic pain in the setting of her acute illness. Plan to check CXR as below.   2. Fever/ Productive Cough - has been occurring for the past week. Reports having subjective fevers and chills along with yellow sputum.  - will check CXR to rule-out PNA.   3. Sinus node dysfunction - s/p PPM placement in 03/2015 - device interrogation in 03/2016 showed normal device function with no mode switches or high ventricular rates noted.  - followed by Dr. Lovena Le.   4. Hypertensive Heart Disease - echo in 11/2014 showed a preserved EF of 55-60% with Grade 1 DD.  - she does not appear volume overloaded by physical examination. BP is stable at 136/68. - continue Lasix 40mg  daily and Valsartan 80mg  daily.   5. History of PE/DVT - Had been on Coumadin in the past but this was discontinued to elevated  LFT's.  - switched to Plavix and denies any evidence of active bleeding.   6. HLD - remains on Crestor 20mg  daily.  - followed by PCP.   7. Preoperative Clearance for Endoscopy - reports needing clearance prior to proceeding with scheduled Endoscopy. She is having pain which seems atypical and likely pleuritic in etiology. EKG is without acute ischemic changes.  - with her atypical symptoms, cath 4 years prior showing minimal CAD and NST 17 months ago being low-risk, would not pursue further ischemic evaluation at this time.  - the procedure itself is low-risk from a cardiac perspective and she is of acceptable risk for the procedure. Would wait for her acute illness to improve prior to proceeding.    Medication Adjustments/Labs and Tests Ordered: Current medicines are reviewed at length with the patient today.  Concerns regarding medicines are outlined above.  Medication changes, Labs and Tests ordered today are listed in the Patient Instructions below. Patient Instructions  Medication Instructions:  Your physician recommends  that you continue on your current medications as directed. Please refer to the Current Medication list given to you today.  Labwork: None ordered  Testing/Procedures: A chest x-ray takes a picture of the organs and structures inside the chest, including the heart, lungs, and blood vessels. This test can show several things, including, whether the heart is enlarges; whether fluid is building up in the lungs; and whether pacemaker / defibrillator leads are still in place.  Follow-Up: Your physician wants you to follow-up in: [place] DR. HARDING  You will receive a reminder letter in the mail two months in advance. If you don't receive a letter, please call our office to schedule the follow-up appointment.  Any Other Special Instructions Will Be Listed Below (If Applicable).  If you need a refill on your cardiac medications before your next appointment, please call your pharmacy.  Signed, Erma Heritage, PA  04/15/2016 6:11 PM    [place], [place], [place]  [address] Phone: [phone]; Fax: [phone]  [address], [place], [place] [address] Phone: [phone]

## 2016-04-15 NOTE — Patient Instructions (Addendum)
Medication Instructions:  Your physician recommends that you continue on your current medications as directed. Please refer to the Current Medication list given to you today.   Labwork: None ordered  Testing/Procedures: A chest x-ray takes a picture of the organs and structures inside the chest, including the heart, lungs, and blood vessels. This test can show several things, including, whether the heart is enlarges; whether fluid is building up in the lungs; and whether pacemaker / defibrillator leads are still in place.   Follow-Up: Your physician wants you to follow-up in: [place] DR. HARDING  You will receive a reminder letter in the mail two months in advance. If you don't receive a letter, please call our office to schedule the follow-up appointment.    Any Other Special Instructions Will Be Listed Below (If Applicable).   X-rays X-rays are tests that create pictures of the inside of your body using radiation. Different body parts absorb different amounts of radiation, which show up on the X-ray pictures in shades of black, gray, and white. X-rays are used to look for many health conditions, including broken bones, lung problems, and some types of cancer. Tell a health care provider about:  Any allergies you have.  All medicines you are taking, including vitamins, herbs, eye drops, creams, and over-the-counter medicines.  Previous surgeries you have had.  Medical conditions you have. What are the risks? Getting an X-ray is a safe procedure. What happens before the procedure?  Tell the X-ray technician if you are pregnant or might be pregnant.  You may be asked to wear a protective lead apron to hide parts of your body from the X-ray.  You usually will need to undress whatever part of your body needs the X-ray. You will be given a hospital gown to wear, if needed.  You may need to remove your glasses, jewelry, and other metal objects. What happens during the  procedure?  The X-ray machine creates a picture by using a tiny burst of radiation. It is painless.  You may need to have several pictures taken at different angles.  You will need to try to be as still as you can during the examination to get the best possible images. What happens after the procedure?  You will be able to resume your normal activities.  The X-ray will be examined by your health care provider or a radiology specialist.  It is your responsibility to get your test results. Ask your health care provider when to expect your results and how to get them. This information is not intended to replace advice given to you by your health care provider. Make sure you discuss any questions you have with your health care provider. Document Released: 01/24/2005 Document Revised: 09/24/2015 Document Reviewed: 03/20/2013 Elsevier Interactive Patient Education  2017 [employer].    If you need a refill on your cardiac medications before your next appointment, please call your pharmacy.

## 2016-04-18 ENCOUNTER — Encounter (HOSPITAL_COMMUNITY): Admission: RE | Payer: Self-pay | Source: Ambulatory Visit

## 2016-04-18 SURGERY — ESOPHAGOSCOPY
Anesthesia: Monitor Anesthesia Care

## 2016-04-21 ENCOUNTER — Ambulatory Visit
Admission: RE | Admit: 2016-04-21 | Discharge: 2016-04-21 | Disposition: A | Discharge status: Home / Self Care | Payer: Medicare HMO | Source: Ambulatory Visit | Attending: Student | Admitting: Student

## 2016-04-21 DIAGNOSIS — I10 Essential (primary) hypertension: Secondary | ICD-10-CM | POA: Diagnosis not present

## 2016-04-21 DIAGNOSIS — R05 Cough: Secondary | ICD-10-CM

## 2016-04-21 DIAGNOSIS — I509 Heart failure, unspecified: Secondary | ICD-10-CM | POA: Diagnosis not present

## 2016-04-21 DIAGNOSIS — R509 Fever, unspecified: Secondary | ICD-10-CM

## 2016-04-21 DIAGNOSIS — R058 Other specified cough: Secondary | ICD-10-CM

## 2016-04-26 ENCOUNTER — Other Ambulatory Visit: Payer: Self-pay | Admitting: Gastroenterology

## 2016-05-04 ENCOUNTER — Encounter (HOSPITAL_COMMUNITY): Payer: Self-pay | Admitting: *Deleted

## 2016-05-04 NOTE — Progress Notes (Signed)
Pt denies SOB and chest pain. Pt made aware to stop vitamins, fish oil and herbal medications. Do not take any NSAIDs ie: Ibuprofen, Advil, Naproxen, BC and Goody Powder or any medication containing Aspirin. Pt stated that last dose of Plavix was " Saturday or Sunday." Pt stated that she was instructed to not take any insulin morning of procedure. Pt made aware to not take Glipizide tonight, check BG every 2 hours prior to arrival, drink 4 ounces of apple juice for BG< 70, recheck BG 15 minutes after drinking juice and call Endo unit if BG remains < 70 after intervention. Both pt and spouse, Clair Gulling ( listening in on call ) verbalized understanding of all pre-op instructions.

## 2016-05-05 ENCOUNTER — Encounter (HOSPITAL_COMMUNITY)
Admission: RE | Disposition: A | Discharge status: Home / Self Care | Payer: Self-pay | Source: Ambulatory Visit | Attending: Gastroenterology

## 2016-05-05 ENCOUNTER — Ambulatory Visit (HOSPITAL_COMMUNITY): Payer: Medicare HMO | Admitting: Certified Registered Nurse Anesthetist

## 2016-05-05 ENCOUNTER — Ambulatory Visit (HOSPITAL_COMMUNITY)
Admission: RE | Admit: 2016-05-05 | Discharge: 2016-05-05 | Disposition: A | Discharge status: Home / Self Care | Payer: Medicare HMO | Source: Ambulatory Visit | Attending: Gastroenterology | Admitting: Gastroenterology

## 2016-05-05 ENCOUNTER — Encounter (HOSPITAL_COMMUNITY): Payer: Self-pay | Admitting: *Deleted

## 2016-05-05 DIAGNOSIS — E119 Type 2 diabetes mellitus without complications: Secondary | ICD-10-CM | POA: Insufficient documentation

## 2016-05-05 DIAGNOSIS — Z7951 Long term (current) use of inhaled steroids: Secondary | ICD-10-CM | POA: Insufficient documentation

## 2016-05-05 DIAGNOSIS — I10 Essential (primary) hypertension: Secondary | ICD-10-CM | POA: Insufficient documentation

## 2016-05-05 DIAGNOSIS — D132 Benign neoplasm of duodenum: Secondary | ICD-10-CM | POA: Diagnosis not present

## 2016-05-05 DIAGNOSIS — Z96659 Presence of unspecified artificial knee joint: Secondary | ICD-10-CM | POA: Diagnosis not present

## 2016-05-05 DIAGNOSIS — Z950 Presence of cardiac pacemaker: Secondary | ICD-10-CM | POA: Diagnosis not present

## 2016-05-05 DIAGNOSIS — G4730 Sleep apnea, unspecified: Secondary | ICD-10-CM | POA: Diagnosis not present

## 2016-05-05 DIAGNOSIS — F419 Anxiety disorder, unspecified: Secondary | ICD-10-CM | POA: Insufficient documentation

## 2016-05-05 DIAGNOSIS — K449 Diaphragmatic hernia without obstruction or gangrene: Secondary | ICD-10-CM | POA: Diagnosis not present

## 2016-05-05 DIAGNOSIS — K317 Polyp of stomach and duodenum: Secondary | ICD-10-CM | POA: Diagnosis not present

## 2016-05-05 DIAGNOSIS — Z9049 Acquired absence of other specified parts of digestive tract: Secondary | ICD-10-CM | POA: Diagnosis not present

## 2016-05-05 DIAGNOSIS — Z79899 Other long term (current) drug therapy: Secondary | ICD-10-CM | POA: Diagnosis not present

## 2016-05-05 DIAGNOSIS — Z8673 Personal history of transient ischemic attack (TIA), and cerebral infarction without residual deficits: Secondary | ICD-10-CM | POA: Diagnosis not present

## 2016-05-05 DIAGNOSIS — K2940 Chronic atrophic gastritis without bleeding: Secondary | ICD-10-CM | POA: Insufficient documentation

## 2016-05-05 DIAGNOSIS — Z800 Family history of malignant neoplasm of digestive organs: Secondary | ICD-10-CM | POA: Diagnosis not present

## 2016-05-05 DIAGNOSIS — R69 Illness, unspecified: Secondary | ICD-10-CM | POA: Diagnosis not present

## 2016-05-05 DIAGNOSIS — I129 Hypertensive chronic kidney disease with stage 1 through stage 4 chronic kidney disease, or unspecified chronic kidney disease: Secondary | ICD-10-CM | POA: Diagnosis not present

## 2016-05-05 DIAGNOSIS — Z794 Long term (current) use of insulin: Secondary | ICD-10-CM | POA: Diagnosis not present

## 2016-05-05 DIAGNOSIS — J45909 Unspecified asthma, uncomplicated: Secondary | ICD-10-CM | POA: Insufficient documentation

## 2016-05-05 DIAGNOSIS — N183 Chronic kidney disease, stage 3 (moderate): Secondary | ICD-10-CM | POA: Diagnosis not present

## 2016-05-05 DIAGNOSIS — F329 Major depressive disorder, single episode, unspecified: Secondary | ICD-10-CM | POA: Diagnosis not present

## 2016-05-05 DIAGNOSIS — Z7902 Long term (current) use of antithrombotics/antiplatelets: Secondary | ICD-10-CM | POA: Diagnosis not present

## 2016-05-05 HISTORY — DX: Family history of other specified conditions: Z84.89

## 2016-05-05 HISTORY — DX: Pneumonia, unspecified organism: J18.9

## 2016-05-05 HISTORY — DX: Idiopathic sleep related nonobstructive alveolar hypoventilation: G47.34

## 2016-05-05 HISTORY — PX: ESOPHAGOGASTRODUODENOSCOPY (EGD) WITH PROPOFOL: SHX5813

## 2016-05-05 SURGERY — ESOPHAGOGASTRODUODENOSCOPY (EGD) WITH PROPOFOL
Anesthesia: Monitor Anesthesia Care

## 2016-05-05 MED ORDER — PROPOFOL 500 MG/50ML IV EMUL
INTRAVENOUS | Status: DC | PRN
  Administered 2016-05-05: 75 ug/kg/min via INTRAVENOUS

## 2016-05-05 MED ORDER — LIDOCAINE 2% (20 MG/ML) 5 ML SYRINGE
INTRAMUSCULAR | Status: DC | PRN
  Administered 2016-05-05: 50 mg via INTRAVENOUS

## 2016-05-05 MED ORDER — PROPOFOL 10 MG/ML IV BOLUS
INTRAVENOUS | Status: DC | PRN
  Administered 2016-05-05: 20 mg via INTRAVENOUS
  Administered 2016-05-05: 10 mg via INTRAVENOUS
  Administered 2016-05-05: 20 mg via INTRAVENOUS
  Administered 2016-05-05 (×2): 10 mg via INTRAVENOUS

## 2016-05-05 MED ORDER — SODIUM CHLORIDE 0.9 % IV SOLN
INTRAVENOUS | Status: DC
  Administered 2016-05-05: 07:00:00 via INTRAVENOUS

## 2016-05-05 NOTE — Anesthesia Procedure Notes (Signed)
Procedure Name: MAC Date/Time: 05/05/2016 8:49 AM Performed by: Candis Shine Pre-anesthesia Checklist: Patient identified, Emergency Drugs available, Suction available, Patient being monitored and Timeout performed Patient Re-evaluated:Patient Re-evaluated prior to inductionOxygen Delivery Method: Nasal cannula Dental Injury: Teeth and Oropharynx as per pre-operative assessment

## 2016-05-05 NOTE — Progress Notes (Signed)
Tara Jackson 8:34 AM  Subjective: Patient doing well since we last saw her in the office without any new complaints  Objective: Vital signs stable afebrile exam please see preassessment evaluation  Assessment: Duodenal polyp  Plan: Okay to proceed with endoscopy today with anesthesia assistance  [facility]  Pager [phone] After 5PM or if no answer call [phone]

## 2016-05-05 NOTE — Anesthesia Postprocedure Evaluation (Addendum)
Anesthesia Post Note  Patient: Tara Jackson  Procedure(s) Performed: Procedure(s) (LRB): ESOPHAGOGASTRODUODENOSCOPY (EGD) WITH PROPOFOL (N/A)  Patient location during evaluation: PACU Anesthesia Type: MAC Level of consciousness: awake and alert Pain management: pain level controlled Vital Signs Assessment: post-procedure vital signs reviewed and stable Respiratory status: spontaneous breathing and respiratory function stable Cardiovascular status: stable Anesthetic complications: no       Last Vitals:  Vitals:   05/05/16 0710 05/05/16 0920  BP: (!) 161/39 (!) 170/85  Pulse: 70 81  Resp: 20 16  Temp: 36.8 C 36.7 C    Last Pain:  Vitals:   05/05/16 0920  TempSrc: Oral                 Marlaina Coburn DANIEL

## 2016-05-05 NOTE — Transfer of Care (Signed)
Immediate Anesthesia Transfer of Care Note  Patient: Tara Jackson  Procedure(s) Performed: Procedure(s): ESOPHAGOGASTRODUODENOSCOPY (EGD) WITH PROPOFOL (N/A)  Patient Location: PACU and Endoscopy Unit  Anesthesia Type:MAC  Level of Consciousness: awake, alert , oriented and patient cooperative  Airway & Oxygen Therapy: Patient Spontanous Breathing and Patient connected to nasal cannula oxygen  Post-op Assessment: Report given to RN, Post -op Vital signs reviewed and stable, Patient moving all extremities and Patient moving all extremities X 4  Post vital signs: Reviewed and stable  Last Vitals:  Vitals:   05/05/16 0710  BP: (!) 161/39  Pulse: 70  Resp: 20  Temp: 36.8 C    Last Pain:  Vitals:   05/05/16 0710  TempSrc: Oral         Complications: No apparent anesthesia complications

## 2016-05-05 NOTE — Op Note (Signed)
[facility] Patient Name: Tara Jackson Procedure Date : 05/05/2016 MRN: [id number] Attending MD: Clarene Essex , MD Date of Birth: [birth year] CSN: [id number] Age: 76 Admit Type: Outpatient Procedure:                Upper GI endoscopy Indications:              Follow-up of polyps in the duodenum Providers:                Clarene Essex, MD, Burtis Junes, RN, William Dalton,                            Technician Referring MD:              Medicines:                Propofol total dose 340 mg IV,50 mg IV lidocaine Complications:            No immediate complications. Estimated Blood Loss:     Estimated blood loss: none. Procedure:                Pre-Anesthesia Assessment:                           - Prior to the procedure, a History and Physical                            was performed, and patient medications and                            allergies were reviewed. The patient's tolerance of                            previous anesthesia was also reviewed. The risks                            and benefits of the procedure and the sedation                            options and risks were discussed with the patient.                            All questions were answered, and informed consent                            was obtained. Prior Anticoagulants: The patient has                            taken Plavix (clopidogrel), last dose was 5 days                            prior to procedure. ASA Grade Assessment: III - A                            patient with severe systemic disease. After  reviewing the risks and benefits, the patient was                            deemed in satisfactory condition to undergo the                            procedure.                           After obtaining informed consent, the endoscope was                            passed under direct vision. Throughout the                            procedure, the patient's blood  pressure, pulse, and                            oxygen saturations were monitored continuously. The                            [id number]-[id number] ([id number]) scope was introduced through the                            mouth, and advanced to the third part of duodenum.                            The upper GI endoscopy was accomplished without                            difficulty. The patient tolerated the procedure                            well. Scope In: Scope Out: Findings:      A small hiatal hernia was present.      Diffuse minimal inflammation was found in the entire examined stomach.      The duodenal bulb, first portion of the duodenum and third portion of       the duodenum were normal.      A single medium-sized semi-sessile polyp with no bleeding was found in       the second portion of the duodenum. The polyp was removed with a       piecemeal technique using a hot snare. Resection and retrieval were       complete.      The exam was otherwise without abnormality. Impression:               - Small hiatal hernia.                           - Atrophic gastritis.                           - Normal duodenal bulb, first portion of the  duodenum and third portion of the duodenum.                           - A single duodenal polyp. Resected and retrieved.                           - The examination was otherwise normal. Moderate Sedation:      moderate sedation-none Recommendation:           - Patient has a contact number available for                            emergencies. The signs and symptoms of potential                            delayed complications were discussed with the                            patient. Return to normal activities tomorrow.                            Written discharge instructions were provided to the                            patient.                           - Soft diet today.                           - Resume Plavix  (clopidogrel) at prior dose in 3                            days.                           - Await pathology results. probably if okay to look                            in 2-3 years if doing well medically                           - Return to GI clinic in 3 months.                           - Telephone GI clinic for pathology results in 1                            week.                           - Telephone GI clinic if symptomatic PRN. 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 All rights reserved. The codes documented in this report are preliminary and upon coder review may  be revised to meet current compliance requirements. Clarene Essex, MD 05/05/2016 9:23:35 AM This report has been signed electronically. Number of Addenda: 0

## 2016-05-05 NOTE — Anesthesia Preprocedure Evaluation (Signed)
Anesthesia Evaluation  Patient identified by MRN, date of birth, ID band Patient awake    Reviewed: Allergy & Precautions, NPO status , Patient's Chart, lab work & pertinent test results  History of Anesthesia Complications (+) POST - OP SPINAL HEADACHE  Airway Mallampati: II  TM Distance: >3 FB     Dental no notable dental hx. (+) Edentulous Upper, Edentulous Lower, Dental Advisory Given   Pulmonary asthma , sleep apnea ,    Pulmonary exam normal        Cardiovascular hypertension, Normal cardiovascular exam+ pacemaker      Neuro/Psych PSYCHIATRIC DISORDERS Anxiety Depression TIACVA    GI/Hepatic Neg liver ROS, hiatal hernia,   Endo/Other  diabetes  Renal/GU Renal InsufficiencyRenal disease     Musculoskeletal   Abdominal   Peds  Hematology   Anesthesia Other Findings   Reproductive/Obstetrics                             Anesthesia Physical Anesthesia Plan  ASA: III  Anesthesia Plan: MAC   Post-op Pain Management:    Induction:   Airway Management Planned: Natural Airway and Simple Face Mask  Additional Equipment:   Intra-op Plan:   Post-operative Plan:   Informed Consent: I have reviewed the patients History and Physical, chart, labs and discussed the procedure including the risks, benefits and alternatives for the proposed anesthesia with the patient or authorized representative who has indicated his/her understanding and acceptance.   Dental advisory given  Plan Discussed with: CRNA and Anesthesiologist  Anesthesia Plan Comments:         Anesthesia Quick Evaluation

## 2016-05-05 NOTE — Discharge Instructions (Signed)
YOU HAD AN ENDOSCOPIC PROCEDURE TODAY: Refer to the procedure report and other information in the discharge instructions given to you for any specific questions about what was found during the examination. If this information does not answer your questions, please call [facility] office at [phone] to clarify.   YOU SHOULD EXPECT: Some feelings of bloating in the abdomen. Passage of more gas than usual. Walking can help get rid of the air that was put into your GI tract during the procedure and reduce the bloating. If you had a lower endoscopy (such as a colonoscopy or flexible sigmoidoscopy) you may notice spotting of blood in your stool or on the toilet paper. Some abdominal soreness may be present for a day or two, also.  DIET: Your first meal following the procedure should be a light meal and then it is ok to progress to your normal diet. A half-sandwich or bowl of soup is an example of a good first meal. Heavy or fried foods are harder to digest and may make you feel nauseous or bloated. Drink plenty of fluids but you should avoid alcoholic beverages for 24 hours. If you had a esophageal dilation, please see attached instructions for diet.   ACTIVITY: Your care partner should take you home directly after the procedure. You should plan to take it easy, moving slowly for the rest of the day. You can resume normal activity the day after the procedure however YOU SHOULD NOT DRIVE, use power tools, machinery or perform tasks that involve climbing or major physical exertion for 24 hours (because of the sedation medicines used during the test).   SYMPTOMS TO REPORT IMMEDIATELY: A gastroenterologist can be reached at any hour. Please call [phone]  for any of the following symptoms:  Following lower endoscopy (colonoscopy, flexible sigmoidoscopy) Excessive amounts of blood in the stool  Significant tenderness, worsening of abdominal pains  Swelling of the abdomen that is new, acute  Fever of 100 or  higher  Following upper endoscopy (EGD, EUS, ERCP, esophageal dilation) Vomiting of blood or coffee ground material  New, significant abdominal pain  New, significant chest pain or pain under the shoulder blades  Painful or persistently difficult swallowing  New shortness of breath  Black, tarry-looking or red, bloody stools  FOLLOW UP:  If any biopsies were taken you will be contacted by phone or by letter within the next 1-3 weeks. Call [phone]  if you have not heard about the biopsies in 3 weeks.  Please also call with any specific questions about appointments or follow up tests. Call if question or problem otherwise slowly advance diet and resume Plavix if doing well in 3 days and otherwise follow-up in 3 months

## 2016-05-06 ENCOUNTER — Encounter (HOSPITAL_COMMUNITY): Payer: Self-pay | Admitting: Gastroenterology

## 2016-05-09 LAB — POCT I-STAT 4, (NA,K, GLUC, HGB,HCT)
Glucose, Bld: 152 mg/dL — ABNORMAL HIGH (ref 65–99)
HCT: 29 % — ABNORMAL LOW (ref 36.0–46.0)
Hemoglobin: 9.9 g/dL — ABNORMAL LOW (ref 12.0–15.0)
Potassium: 3.7 mmol/L (ref 3.5–5.1)
Sodium: 140 mmol/L (ref 135–145)

## 2016-05-22 DIAGNOSIS — I10 Essential (primary) hypertension: Secondary | ICD-10-CM | POA: Diagnosis not present

## 2016-05-22 DIAGNOSIS — I509 Heart failure, unspecified: Secondary | ICD-10-CM | POA: Diagnosis not present

## 2016-06-02 ENCOUNTER — Other Ambulatory Visit: Payer: Self-pay | Admitting: Neurology

## 2016-06-03 DIAGNOSIS — E118 Type 2 diabetes mellitus with unspecified complications: Secondary | ICD-10-CM | POA: Diagnosis not present

## 2016-06-03 DIAGNOSIS — Z794 Long term (current) use of insulin: Secondary | ICD-10-CM | POA: Diagnosis not present

## 2016-06-07 ENCOUNTER — Ambulatory Visit (HOSPITAL_COMMUNITY): Admission: RE | Admit: 2016-06-07 | Payer: Medicare HMO | Source: Ambulatory Visit | Admitting: Gastroenterology

## 2016-06-16 ENCOUNTER — Ambulatory Visit (INDEPENDENT_AMBULATORY_CARE_PROVIDER_SITE_OTHER): Payer: Medicare HMO | Admitting: *Deleted

## 2016-06-16 DIAGNOSIS — E1140 Type 2 diabetes mellitus with diabetic neuropathy, unspecified: Secondary | ICD-10-CM | POA: Diagnosis not present

## 2016-06-16 DIAGNOSIS — Z6841 Body Mass Index (BMI) 40.0 and over, adult: Secondary | ICD-10-CM | POA: Diagnosis not present

## 2016-06-16 DIAGNOSIS — Z794 Long term (current) use of insulin: Secondary | ICD-10-CM | POA: Diagnosis not present

## 2016-06-16 DIAGNOSIS — I495 Sick sinus syndrome: Secondary | ICD-10-CM | POA: Diagnosis not present

## 2016-06-16 DIAGNOSIS — K219 Gastro-esophageal reflux disease without esophagitis: Secondary | ICD-10-CM | POA: Diagnosis not present

## 2016-06-16 DIAGNOSIS — R69 Illness, unspecified: Secondary | ICD-10-CM | POA: Diagnosis not present

## 2016-06-16 NOTE — Progress Notes (Signed)
Remote pacemaker transmission.   

## 2016-06-17 LAB — CUP PACEART REMOTE DEVICE CHECK
Battery Remaining Longevity: 168 mo
Battery Remaining Percentage: 100 %
Brady Statistic RA Percent Paced: 62 %
Brady Statistic RV Percent Paced: 35 %
Date Time Interrogation Session: 20180510070200
Implantable Lead Implant Date: 20170214
Implantable Lead Implant Date: 20170214
Implantable Lead Location: 753859
Implantable Lead Location: 753860
Implantable Lead Model: 7740
Implantable Lead Model: 7741
Implantable Lead Serial Number: 649859
Implantable Lead Serial Number: 728741
Implantable Pulse Generator Implant Date: 20170214
Lead Channel Impedance Value: 712 Ohm
Lead Channel Impedance Value: 805 Ohm
Lead Channel Pacing Threshold Amplitude: 0.6 V
Lead Channel Pacing Threshold Amplitude: 1.3 V
Lead Channel Pacing Threshold Pulse Width: 0.4 ms
Lead Channel Pacing Threshold Pulse Width: 0.4 ms
Lead Channel Setting Pacing Amplitude: 1.8 V
Lead Channel Setting Pacing Amplitude: 2 V
Lead Channel Setting Pacing Pulse Width: 0.4 ms
Lead Channel Setting Sensing Sensitivity: 2.5 mV
Pulse Gen Serial Number: 718098

## 2016-06-21 DIAGNOSIS — I509 Heart failure, unspecified: Secondary | ICD-10-CM | POA: Diagnosis not present

## 2016-06-21 DIAGNOSIS — I10 Essential (primary) hypertension: Secondary | ICD-10-CM | POA: Diagnosis not present

## 2016-06-28 ENCOUNTER — Emergency Department (HOSPITAL_COMMUNITY)
Admission: EM | Admit: 2016-06-28 | Discharge: 2016-06-28 | Disposition: A | Discharge status: Home / Self Care | Payer: Medicare HMO | Attending: Emergency Medicine | Admitting: Emergency Medicine

## 2016-06-28 ENCOUNTER — Encounter (HOSPITAL_COMMUNITY): Payer: Self-pay | Admitting: Emergency Medicine

## 2016-06-28 DIAGNOSIS — E1122 Type 2 diabetes mellitus with diabetic chronic kidney disease: Secondary | ICD-10-CM | POA: Diagnosis not present

## 2016-06-28 DIAGNOSIS — E039 Hypothyroidism, unspecified: Secondary | ICD-10-CM | POA: Diagnosis not present

## 2016-06-28 DIAGNOSIS — Z96653 Presence of artificial knee joint, bilateral: Secondary | ICD-10-CM | POA: Insufficient documentation

## 2016-06-28 DIAGNOSIS — Y939 Activity, unspecified: Secondary | ICD-10-CM | POA: Insufficient documentation

## 2016-06-28 DIAGNOSIS — J45909 Unspecified asthma, uncomplicated: Secondary | ICD-10-CM | POA: Insufficient documentation

## 2016-06-28 DIAGNOSIS — Z8673 Personal history of transient ischemic attack (TIA), and cerebral infarction without residual deficits: Secondary | ICD-10-CM | POA: Insufficient documentation

## 2016-06-28 DIAGNOSIS — S40021A Contusion of right upper arm, initial encounter: Secondary | ICD-10-CM

## 2016-06-28 DIAGNOSIS — Y999 Unspecified external cause status: Secondary | ICD-10-CM | POA: Insufficient documentation

## 2016-06-28 DIAGNOSIS — E1140 Type 2 diabetes mellitus with diabetic neuropathy, unspecified: Secondary | ICD-10-CM | POA: Diagnosis not present

## 2016-06-28 DIAGNOSIS — I5032 Chronic diastolic (congestive) heart failure: Secondary | ICD-10-CM | POA: Diagnosis not present

## 2016-06-28 DIAGNOSIS — T148XXA Other injury of unspecified body region, initial encounter: Secondary | ICD-10-CM

## 2016-06-28 DIAGNOSIS — Z79899 Other long term (current) drug therapy: Secondary | ICD-10-CM | POA: Diagnosis not present

## 2016-06-28 DIAGNOSIS — I130 Hypertensive heart and chronic kidney disease with heart failure and stage 1 through stage 4 chronic kidney disease, or unspecified chronic kidney disease: Secondary | ICD-10-CM | POA: Diagnosis not present

## 2016-06-28 DIAGNOSIS — W228XXA Striking against or struck by other objects, initial encounter: Secondary | ICD-10-CM | POA: Diagnosis not present

## 2016-06-28 DIAGNOSIS — S4991XA Unspecified injury of right shoulder and upper arm, initial encounter: Secondary | ICD-10-CM | POA: Diagnosis present

## 2016-06-28 DIAGNOSIS — Z794 Long term (current) use of insulin: Secondary | ICD-10-CM | POA: Insufficient documentation

## 2016-06-28 DIAGNOSIS — Y929 Unspecified place or not applicable: Secondary | ICD-10-CM | POA: Insufficient documentation

## 2016-06-28 DIAGNOSIS — N183 Chronic kidney disease, stage 3 (moderate): Secondary | ICD-10-CM | POA: Insufficient documentation

## 2016-06-28 NOTE — ED Triage Notes (Signed)
Pt sts got right arm stuck in potty chair yesterday c/o bruising and pain to right upper arm; pt sts takes plavix

## 2016-06-28 NOTE — ED Provider Notes (Signed)
[facility] DEPT Provider Note   CSN: [id number] Arrival date & time: 06/28/16  1005     History   Chief Complaint Chief Complaint  Patient presents with  . Bleeding/Bruising  . Arm Pain    HPI Tara Jackson is a 76 y.o. female.  Patient reports that 2 days ago she fell and hit her arm on her bedside commode. Since then has had some increased pain and swelling in the distribution where she hit her arm. Developed bruising. She has a history of a DVT. Concern she had developed another DVT, so presented here.   The history is provided by the patient.  Illness  This is a new problem. The current episode started more than 2 days ago. The problem has been gradually worsening. Pertinent negatives include no chest pain and no shortness of breath. The symptoms are relieved by ice.    Past Medical History:  Diagnosis Date  . Anemia   . Anxiety   . Arthritis   . Asthma   . Brittle bone disease   . Cataract   . Chest pain    a. minimal CAD by cath in 2014 with 40% RI stenosis. b. low-risk NST in 11/2014.  Marland Kitchen Complication of anesthesia    hard to wake up after anesthesia, trouble turning head  . Depression   . Diabetes mellitus type 2, insulin dependent ([place])   . Diabetic neuropathy ([place])   . Diastolic dysfunction, left ventricle 11/30/14   EF 55%, grade 1 DD  . DVT (deep venous thrombosis) ([place])    "18 in RLE; 7 LLE prior to PE" (03/24/2015)  . Family history of adverse reaction to anesthesia    " the whole family is hard to wake up"  . Glaucoma   . H/O hiatal hernia   . Hyperlipidemia   . Hypertension associated with diabetes ([place])   . Hypothyroidism   . Kidney stones    "passed them"  . Memory loss 06/03/2014  . Normal cardiac stress test 11/30/14   Negative Myoview  . On home oxygen therapy    "2L oxygen concentrator @ night"  . Osteoporosis   . Oxygen desaturation during sleep    wears 2 liters of oxygen at night   . Palpitations 35years ago   Cardionet  monitor - revealed mostly normal sinus rhythm, sinus bradycardia with first-degree A-V block heart rates mostly in the 50s and 60s with some 70s. No arrhythmias, PVCs or PACs noted.  . PE (pulmonary thromboembolism) ([place]) x 3, last one was ~ 2003   history of recurrent RLE DVT with PE - last PE ~>13 yrs; Maintained on Plavix  . Pneumonia   . Presence of permanent cardiac pacemaker   . Restless legs syndrome   . Sleep apnea    cpap disontinued; test done 07/14/2008 ordered per  [place]  . Spinal headache   . Spinal stenosis    L 3, L 4 and L 5, and C 1, C 2 and C3  . Stroke [facility]) 04-19-2013   tia and stroke. Weakness Rt hand  . 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 s/p [employer] PPM in 03/2015 by Dr. Lovena Le secondary to sinus node dysfunction.   Marland Kitchen TIA (transient ischemic attack) March 2014    Patient Active Problem List   Diagnosis Date Noted  . Pacemaker 04/26/2015  . Chronic daily headache 04/07/2015  . Symptomatic bradycardia 03/24/2015  . Sinus node dysfunction ([place]) 03/24/2015  . Chronic diastolic heart failure ([place])   .  Diabetes mellitus type 2 in obese ([place])   . Normal coronary arteries 01/14/2015  . Normal cardiac stress test 11/30/2014  . Bradycardia 11/28/2014  . Type 2 diabetes mellitus with renal manifestations ([place]) 11/28/2014  . Morbid obesity ([place]) 11/28/2014  . CKD stage 3 due to type 2 diabetes mellitus ([place]) 11/28/2014  . Memory loss 06/03/2014  . AKI (acute kidney injury) ([place])   . Chest pain 04/19/2014  . Pleuritic chest pain 04/19/2014  . Diabetes mellitus ([place]) 04/19/2014  . Edema of both legs 04/19/2014  . Dyspnea   . SOB (shortness of breath)   . Tremor of right hand 09/20/2013  . Headache(784.0) 09/19/2013  . Restless legs syndrome (RLS) 09/19/2013  . Constipation due to pain medication 08/21/2013  . Ankle edema 08/21/2013  . PVC's (premature ventricular contractions) 02/10/2013  . Diastolic dysfunction, left ventricle   . Intrinsic asthma  10/02/2009  . PULMONARY EMBOLISM, HX OF 10/02/2009  . ALLERGIC RHINITIS 04/07/2009  . SLEEP APNEA 07/09/2008  . Hyperlipidemia with target LDL less than 130 05/20/2008  . Essential hypertension 05/20/2008    Past Surgical History:  Procedure Laterality Date  . ABDOMINAL HYSTERECTOMY    . APPENDECTOMY    . BACK SURGERY     steroid inj  . CARDIAC CATHETERIZATION  02/14/2012   no evidence of obstructive coronary disease ,low EDP with normal EF  . CATARACT EXTRACTION W/ INTRAOCULAR LENS  IMPLANT, BILATERAL Bilateral 2000s  . CHOLECYSTECTOMY    . DOPPLER ECHOCARDIOGRAPHY  2014; October 2016   a. EF 49-44%; normal diastolic pressures, no regional wall motion motion abnormalities;; b/ F 55-60%. Normal wall motion. GR 1 DD. No valve lesions  . EP IMPLANTABLE DEVICE N/A 03/24/2015   [facility] [employer] PPM, Dr. Lovena Le  . ESOPHAGOGASTRODUODENOSCOPY  08/09/2011   Procedure: ESOPHAGOGASTRODUODENOSCOPY (EGD);  Surgeon: Jeryl Columbia, MD;  Location: [facility];  Service: Endoscopy;  Laterality: N/A;  . ESOPHAGOGASTRODUODENOSCOPY (EGD) WITH PROPOFOL N/A 11/12/2013   Procedure: ESOPHAGOGASTRODUODENOSCOPY (EGD) WITH PROPOFOL;  Surgeon: Jeryl Columbia, MD;  Location: [facility] ENDOSCOPY;  Service: Endoscopy;  Laterality: N/A;  . ESOPHAGOGASTRODUODENOSCOPY (EGD) WITH PROPOFOL N/A 05/05/2016   Procedure: ESOPHAGOGASTRODUODENOSCOPY (EGD) WITH PROPOFOL;  Surgeon: Clarene Essex, MD;  Location: [facility] ENDOSCOPY;  Service: Endoscopy;  Laterality: N/A;  . GLAUCOMA SURGERY Bilateral 2000s   "laser"  . HOT HEMOSTASIS  08/09/2011   Procedure: HOT HEMOSTASIS (ARGON PLASMA COAGULATION/BICAP);  Surgeon: Jeryl Columbia, MD;  Location: [facility]OS[facility];  Service: Endoscopy;  Laterality: N/A;  . HOT HEMOSTASIS N/A 11/12/2013   Procedure: HOT HEMOSTASIS (ARGON PLASMA COAGULATION/BICAP);  Surgeon: Jeryl Columbia, MD;  Location: [facility];  Service: Endoscopy;  Laterality: N/A;  . INSERT / REPLACE / REMOVE PACEMAKER  03/24/2015  . JOINT REPLACEMENT     . KNEE SURGERY Left done with 2nd left knee replacement   spacer bar placement  . LEFT HEART CATHETERIZATION WITH CORONARY ANGIOGRAM N/A 02/14/2012   Procedure: LEFT HEART CATHETERIZATION WITH CORONARY ANGIOGRAM;  Surgeon: Leonie Man, MD;  Location[facility] CATH LAB;  Service: Cardiovascular;  Laterality: N/A;  . LEV DOPPLER  05/29/2012   RIGHT EXTREM. NORMAL VENOUS DUPLEX  . NM MYOCAR PERF WALL MOTION  01/26/2012; 11/2014   a. EF 79%,LV normal,ST SEGMENT CHANGE SUGGESTIVE OF ISCHEMIA.; LOW RISK, EF 60%. No ischemia or infarction. No wall motion abnormality   . PACEMAKER PLACEMENT    . REVISION TOTAL KNEE ARTHROPLASTY Left   . TONSILLECTOMY    . TOTAL KNEE ARTHROPLASTY Bilateral   .  TRIGGER FINGER RELEASE  11/22/2011   Procedure: RELEASE TRIGGER FINGER/A-1 PULLEY;  Surgeon: Meredith Pel, MD;  Location: [facility];  Service: Orthopedics;  Laterality: Left;  Left trigger thumb release  . TRIGGER FINGER RELEASE Right yrs ago    OB History    No data available       Home Medications    Prior to Admission medications   Medication Sig Start Date End Date Taking? Authorizing Provider  acetaminophen (TYLENOL) 500 MG tablet Take 500 mg by mouth every 6 (six) hours as needed for moderate pain.    Yes [provider]  albuterol (PROVENTIL HFA;VENTOLIN HFA) 108 (90 BASE) MCG/ACT inhaler Inhale 2 puffs into the lungs every 6 (six) hours as needed for wheezing.   Yes [provider]  Biotin 1 MG CAPS Take 1 mg by mouth daily as needed (supplement).   Yes [provider]  busPIRone (BUSPAR) 10 MG tablet Take 1 tablet (10 mg total) by mouth every morning. 06/03/14  Yes Garvin Fila, MD  Cholecalciferol (VITAMIN D-3 PO) Take 1 tablet by mouth every morning.    Yes [provider]  clonazePAM (KLONOPIN) 0.5 MG tablet Take 0.5-1 mg by mouth 2 (two) times daily. Takes 2 tabs in am and 1 tab in pm   Yes [provider]  clopidogrel (PLAVIX) 75 MG tablet  Take 75 mg by mouth every morning.   Yes [provider]  dicyclomine (BENTYL) 10 MG capsule Take 10 mg by mouth 4 (four) times daily as needed for spasms.    Yes [provider]  esomeprazole (NEXIUM) 40 MG capsule Take 40 mg by mouth daily before breakfast.   Yes [provider]  furosemide (LASIX) 40 MG tablet Take 40 mg by mouth daily.   Yes [provider]  glipiZIDE (GLUCOTROL) 10 MG tablet Take 10-20 mg by mouth 2 (two) times daily before a meal.  05/03/14  Yes [provider]  insulin NPH (HUMULIN N,NOVOLIN N) 100 UNIT/ML injection Inject 30 Units into the skin daily before breakfast. Novolin-N   Yes [provider]  insulin regular (NOVOLIN R,HUMULIN R) 100 units/mL injection Inject 20 Units into the skin 2 (two) times daily before a meal.    Yes [provider]  levothyroxine (SYNTHROID, LEVOTHROID) 100 MCG tablet Take 100 mcg by mouth every morning.    Yes [provider]  lidocaine (LIDODERM) 5 % Place 3 patches onto the skin every other day. Remove & Discard patch within 12 hours or as directed by MD   Yes [provider]  LORazepam (ATIVAN) 0.5 MG tablet Take 0.5 mg by mouth daily as needed for anxiety.   Yes [provider]  magnesium 30 MG tablet Take 30 mg by mouth daily.   Yes [provider]  meclizine (ANTIVERT) 12.5 MG tablet Take 12.5 mg by mouth 3 (three) times daily as needed for dizziness.    Yes [provider]  nortriptyline (PAMELOR) 50 MG capsule TAKE 2 CAPSULES (100MG ) AT BEDTIME 06/02/16  Yes Garvin Fila, MD  Plecanatide (TRULANCE PO) Take 1 capsule by mouth daily.    Yes [provider]  polyethylene glycol (MIRALAX / GLYCOLAX) packet Take 17 g by mouth daily as needed for mild constipation.   Yes [provider]  potassium chloride SA (K-DUR,KLOR-CON) 20 MEQ tablet Take 1 tablet (20 mEq total) by mouth 2 (two) times daily. Patient taking  differently: Take 10 mEq by mouth 2 (two)  times daily.  06/22/15  Yes Davonna Belling, MD  rosuvastatin (CRESTOR) 20 MG tablet Take 1 tablet (20 mg total) by mouth every morning. 06/15/15  Yes Leonie Man, MD  Sennosides (EX-LAX PO) Take 1 tablet by mouth daily as needed (constipation).    Yes [provider]  sodium phosphate (FLEET) enema Place 1 enema rectally once as needed (constipation). follow package directions    Yes [provider]  solifenacin (VESICARE) 10 MG tablet Take 10 mg by mouth daily as needed (overactive bladder).    Yes [provider]  sucralfate (CARAFATE) 1 G tablet Take 1 g by mouth daily as needed (stomach upset).    Yes [provider]  terconazole (TERAZOL 7) 0.4 % vaginal cream Place 1 applicator vaginally at bedtime as needed (for yeast infection).  04/18/14  Yes [provider]  traMADol (ULTRAM) 50 MG tablet Take 1 tablet (50 mg total) by mouth every 12 (twelve) hours as needed for moderate pain. 03/27/15  Yes Bhagat, Bhavinkumar, PA  valsartan (DIOVAN) 160 MG tablet Take 80 mg by mouth daily.   Yes [provider]    Family History Family History  Problem Relation Age of Onset  . Cancer Mother   . Cancer - Prostate Father     Social History Social History  Substance Use Topics  . Smoking status: Never Smoker  . Smokeless tobacco: Never Used  . Alcohol use No     Allergies   Iodine; Iohexol; Metoclopramide hcl; Sulfa antibiotics; Lactose intolerance (gi); and Lansoprazole   Review of Systems Review of Systems  Constitutional: Negative for chills and fever.  Respiratory: Negative for shortness of breath.   Cardiovascular: Negative for chest pain and leg swelling.  Musculoskeletal: Negative for joint swelling.  Skin:       Bruising     Physical Exam Updated Vital Signs BP (!) 148/79   Pulse (!) 59   Temp 97.4 F (36.3 C) (Oral)   Resp 18   SpO2 100%   Physical Exam    Constitutional: She appears well-developed and well-nourished. No distress.  HENT:  Head: Normocephalic and atraumatic.  Eyes: Conjunctivae are normal.  Neck: Neck supple.  Cardiovascular: Normal rate and regular rhythm.   No murmur heard. Pulmonary/Chest: Effort normal and breath sounds normal. No respiratory distress.  Abdominal: Soft. There is no tenderness.  Musculoskeletal: She exhibits no edema.  Mild swelling of the left upper arm. Ecchymoses to the lateral aspect. Compartments are soft. Neurovascularly intact. Full range of motion of all joints without issue.  Neurological: She is alert.  Skin: Skin is warm and dry.  Psychiatric: She has a normal mood and affect.  Nursing note and vitals reviewed.    ED Treatments / Results  Labs (all labs ordered are listed, but only abnormal results are displayed) Labs Reviewed - No data to display  EKG  EKG Interpretation None       Radiology No results found.  Procedures Procedures (including critical care time)  Medications Ordered in ED Medications - No data to display   Initial Impression / Assessment and Plan / ED Course  I have reviewed the triage vital signs and the nursing notes.  Pertinent labs & imaging results that were available during my care of the patient were reviewed by me and considered in my medical decision making (see chart for details).     Feel the most likely diagnosis for the patient is a hematoma. Patient struck her arm, and then  had onset of swelling, ecchymoses. Patient's main concern was that she had a new DVT. Swelling that the patient is appreciating is to the lateral aspect of the arm. Not distribution of the deep vein. Low suspicion for DVT at this time. No evidence of infectious process. Compartments are soft. Doubt compartment syndrome. No other gross deformity and no tenderness to palpation along the bones. Doubt fracture. Encouraged her to continue to use over-the-counter pain  medications, elevation, ice as needed. Told her to follow up with her primary care doctor in the next couple days for reevaluation.  Final Clinical Impressions(s) / ED Diagnoses   Final diagnoses:  Contusion of right upper arm, initial encounter  Hematoma    New Prescriptions Discharge Medication List as of 06/28/2016  1:01 PM       Maryan Puls, MD 06/28/16 1541    Gareth Morgan, MD 06/29/16 [id number]

## 2016-07-09 NOTE — Addendum Note (Signed)
Addendum  created 07/09/16 1031 by Duane Boston, MD   Sign clinical note

## 2016-07-22 DIAGNOSIS — I10 Essential (primary) hypertension: Secondary | ICD-10-CM | POA: Diagnosis not present

## 2016-07-22 DIAGNOSIS — I509 Heart failure, unspecified: Secondary | ICD-10-CM | POA: Diagnosis not present

## 2016-08-08 DIAGNOSIS — R2689 Other abnormalities of gait and mobility: Secondary | ICD-10-CM | POA: Diagnosis not present

## 2016-08-08 DIAGNOSIS — R413 Other amnesia: Secondary | ICD-10-CM | POA: Diagnosis not present

## 2016-08-08 DIAGNOSIS — G6289 Other specified polyneuropathies: Secondary | ICD-10-CM | POA: Diagnosis not present

## 2016-08-08 DIAGNOSIS — E1140 Type 2 diabetes mellitus with diabetic neuropathy, unspecified: Secondary | ICD-10-CM | POA: Diagnosis not present

## 2016-08-08 DIAGNOSIS — M545 Low back pain: Secondary | ICD-10-CM | POA: Diagnosis not present

## 2016-08-08 DIAGNOSIS — Z6841 Body Mass Index (BMI) 40.0 and over, adult: Secondary | ICD-10-CM | POA: Diagnosis not present

## 2016-08-08 DIAGNOSIS — I129 Hypertensive chronic kidney disease with stage 1 through stage 4 chronic kidney disease, or unspecified chronic kidney disease: Secondary | ICD-10-CM | POA: Diagnosis not present

## 2016-08-08 DIAGNOSIS — I6990 Unspecified sequelae of unspecified cerebrovascular disease: Secondary | ICD-10-CM | POA: Diagnosis not present

## 2016-08-21 DIAGNOSIS — I509 Heart failure, unspecified: Secondary | ICD-10-CM | POA: Diagnosis not present

## 2016-08-21 DIAGNOSIS — I10 Essential (primary) hypertension: Secondary | ICD-10-CM | POA: Diagnosis not present

## 2016-08-22 DIAGNOSIS — E1140 Type 2 diabetes mellitus with diabetic neuropathy, unspecified: Secondary | ICD-10-CM | POA: Diagnosis not present

## 2016-08-31 DIAGNOSIS — E118 Type 2 diabetes mellitus with unspecified complications: Secondary | ICD-10-CM | POA: Diagnosis not present

## 2016-08-31 DIAGNOSIS — Z794 Long term (current) use of insulin: Secondary | ICD-10-CM | POA: Diagnosis not present

## 2016-09-15 ENCOUNTER — Ambulatory Visit (INDEPENDENT_AMBULATORY_CARE_PROVIDER_SITE_OTHER): Payer: Medicare HMO | Admitting: *Deleted

## 2016-09-15 DIAGNOSIS — I495 Sick sinus syndrome: Secondary | ICD-10-CM | POA: Diagnosis not present

## 2016-09-15 NOTE — Progress Notes (Signed)
Remote pacemaker transmission.   

## 2016-09-21 DIAGNOSIS — I509 Heart failure, unspecified: Secondary | ICD-10-CM | POA: Diagnosis not present

## 2016-09-21 DIAGNOSIS — I10 Essential (primary) hypertension: Secondary | ICD-10-CM | POA: Diagnosis not present

## 2016-09-22 DIAGNOSIS — R600 Localized edema: Secondary | ICD-10-CM | POA: Diagnosis not present

## 2016-09-22 DIAGNOSIS — M545 Low back pain: Secondary | ICD-10-CM | POA: Diagnosis not present

## 2016-09-22 DIAGNOSIS — I129 Hypertensive chronic kidney disease with stage 1 through stage 4 chronic kidney disease, or unspecified chronic kidney disease: Secondary | ICD-10-CM | POA: Diagnosis not present

## 2016-09-22 DIAGNOSIS — I6990 Unspecified sequelae of unspecified cerebrovascular disease: Secondary | ICD-10-CM | POA: Diagnosis not present

## 2016-09-22 LAB — CUP PACEART REMOTE DEVICE CHECK
Battery Remaining Longevity: 162 mo
Battery Remaining Percentage: 100 %
Brady Statistic RA Percent Paced: 63 %
Brady Statistic RV Percent Paced: 35 %
Date Time Interrogation Session: 20180809070100
Implantable Lead Implant Date: 20170214
Implantable Lead Implant Date: 20170214
Implantable Lead Location: 753859
Implantable Lead Location: 753860
Implantable Lead Model: 7740
Implantable Lead Model: 7741
Implantable Lead Serial Number: 649859
Implantable Lead Serial Number: 728741
Implantable Pulse Generator Implant Date: 20170214
Lead Channel Impedance Value: 731 Ohm
Lead Channel Impedance Value: 786 Ohm
Lead Channel Pacing Threshold Amplitude: 0.5 V
Lead Channel Pacing Threshold Amplitude: 1.4 V
Lead Channel Pacing Threshold Pulse Width: 0.4 ms
Lead Channel Pacing Threshold Pulse Width: 0.4 ms
Lead Channel Setting Pacing Amplitude: 1.9 V
Lead Channel Setting Pacing Amplitude: 2 V
Lead Channel Setting Pacing Pulse Width: 0.4 ms
Lead Channel Setting Sensing Sensitivity: 2.5 mV
Pulse Gen Serial Number: 718098

## 2016-09-30 ENCOUNTER — Encounter: Payer: Self-pay | Admitting: Cardiology

## 2016-10-10 ENCOUNTER — Observation Stay (HOSPITAL_COMMUNITY)
Admission: EM | Admit: 2016-10-10 | Discharge: 2016-10-14 | Disposition: A | Discharge status: Home / Self Care | Payer: Medicare HMO | Attending: Family Medicine | Admitting: Family Medicine

## 2016-10-10 ENCOUNTER — Encounter (HOSPITAL_COMMUNITY): Payer: Self-pay | Admitting: Emergency Medicine

## 2016-10-10 ENCOUNTER — Emergency Department (HOSPITAL_COMMUNITY): Payer: Medicare HMO

## 2016-10-10 DIAGNOSIS — E039 Hypothyroidism, unspecified: Secondary | ICD-10-CM | POA: Insufficient documentation

## 2016-10-10 DIAGNOSIS — J45909 Unspecified asthma, uncomplicated: Secondary | ICD-10-CM | POA: Insufficient documentation

## 2016-10-10 DIAGNOSIS — I5032 Chronic diastolic (congestive) heart failure: Secondary | ICD-10-CM | POA: Diagnosis not present

## 2016-10-10 DIAGNOSIS — Z950 Presence of cardiac pacemaker: Secondary | ICD-10-CM | POA: Diagnosis present

## 2016-10-10 DIAGNOSIS — Z7902 Long term (current) use of antithrombotics/antiplatelets: Secondary | ICD-10-CM | POA: Diagnosis not present

## 2016-10-10 DIAGNOSIS — N183 Chronic kidney disease, stage 3 (moderate): Secondary | ICD-10-CM | POA: Diagnosis not present

## 2016-10-10 DIAGNOSIS — Z79899 Other long term (current) drug therapy: Secondary | ICD-10-CM | POA: Insufficient documentation

## 2016-10-10 DIAGNOSIS — E1122 Type 2 diabetes mellitus with diabetic chronic kidney disease: Secondary | ICD-10-CM | POA: Diagnosis not present

## 2016-10-10 DIAGNOSIS — I130 Hypertensive heart and chronic kidney disease with heart failure and stage 1 through stage 4 chronic kidney disease, or unspecified chronic kidney disease: Secondary | ICD-10-CM | POA: Diagnosis not present

## 2016-10-10 DIAGNOSIS — G4733 Obstructive sleep apnea (adult) (pediatric): Secondary | ICD-10-CM | POA: Diagnosis present

## 2016-10-10 DIAGNOSIS — E1129 Type 2 diabetes mellitus with other diabetic kidney complication: Secondary | ICD-10-CM | POA: Diagnosis present

## 2016-10-10 DIAGNOSIS — G4730 Sleep apnea, unspecified: Secondary | ICD-10-CM | POA: Diagnosis present

## 2016-10-10 DIAGNOSIS — R0602 Shortness of breath: Secondary | ICD-10-CM | POA: Diagnosis not present

## 2016-10-10 DIAGNOSIS — R079 Chest pain, unspecified: Secondary | ICD-10-CM | POA: Diagnosis not present

## 2016-10-10 DIAGNOSIS — Z8673 Personal history of transient ischemic attack (TIA), and cerebral infarction without residual deficits: Secondary | ICD-10-CM | POA: Diagnosis not present

## 2016-10-10 DIAGNOSIS — I519 Heart disease, unspecified: Secondary | ICD-10-CM | POA: Diagnosis present

## 2016-10-10 DIAGNOSIS — I10 Essential (primary) hypertension: Secondary | ICD-10-CM | POA: Diagnosis present

## 2016-10-10 DIAGNOSIS — Z794 Long term (current) use of insulin: Secondary | ICD-10-CM | POA: Diagnosis not present

## 2016-10-10 DIAGNOSIS — Z86718 Personal history of other venous thrombosis and embolism: Secondary | ICD-10-CM

## 2016-10-10 DIAGNOSIS — D649 Anemia, unspecified: Secondary | ICD-10-CM | POA: Diagnosis present

## 2016-10-10 LAB — BASIC METABOLIC PANEL
Anion gap: 10 (ref 5–15)
BUN: 20 mg/dL (ref 6–20)
CO2: 27 mmol/L (ref 22–32)
Calcium: 9 mg/dL (ref 8.9–10.3)
Chloride: 98 mmol/L — ABNORMAL LOW (ref 101–111)
Creatinine, Ser: 1.76 mg/dL — ABNORMAL HIGH (ref 0.44–1.00)
GFR calc Af Amer: 31 mL/min — ABNORMAL LOW (ref >60)
GFR calc non Af Amer: 27 mL/min — ABNORMAL LOW (ref >60)
Glucose, Bld: 97 mg/dL (ref 65–99)
Potassium: 3.3 mmol/L — ABNORMAL LOW (ref 3.5–5.1)
Sodium: 135 mmol/L (ref 135–145)

## 2016-10-10 LAB — I-STAT TROPONIN, ED: Troponin i, poc: 0.02 ng/mL (ref 0.00–0.08)

## 2016-10-10 LAB — CBC
HCT: 33.6 % — ABNORMAL LOW (ref 36.0–46.0)
Hemoglobin: 10.7 g/dL — ABNORMAL LOW (ref 12.0–15.0)
MCH: 30.3 pg (ref 26.0–34.0)
MCHC: 31.8 g/dL (ref 30.0–36.0)
MCV: 95.2 fL (ref 78.0–100.0)
Platelets: 270 10*3/uL (ref 150–400)
RBC: 3.53 MIL/uL — ABNORMAL LOW (ref 3.87–5.11)
RDW: 12.9 % (ref 11.5–15.5)
WBC: 7 10*3/uL (ref 4.0–10.5)

## 2016-10-10 NOTE — ED Provider Notes (Signed)
[facility] DEPT Provider Note   CSN: [id number] Arrival date & time: 10/10/16  1956     History   Chief Complaint Chief Complaint  Patient presents with  . Chest Pain    HPI Tara Jackson is a 76 y.o. female.  The history is provided by the patient and medical records.  Chest Pain   Associated symptoms include cough and shortness of breath.    76 y.o. F with hx of anemia, anxiety, arthritis, asthma, depression, DM, CHF with estimated EF of 55-60%, HLP, hypothyroidism, HTN, hx of DVT and PE, pacemaker placement, CAD with non-obstructive disease managed medically, presenting to the ED for chest pain and SOB. Patient states this started yesterday.  States she had pain in the center of her chest as well as back pain.   States she gets SOB with exertion, but that is not so much of an issue right now.  States she has been coughing and yesterday she coughed up some blood.  No fever, chills, sweats.  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  States last time she had a PE she was coughing up blood so this concerned her.  She is followed by cardiology, Dr. Ellyn Hack as well as Dr. Lovena Le for her pacemaker.  Patient does use oxygen concentrator at night.  Was previously on coumadin for DVT/PE, however had to be switched to plavix due to hepatic impairment.  Past Medical History:  Diagnosis Date  . Anemia   . Anxiety   . Arthritis   . Asthma   . Brittle bone disease   . Cataract   . Chest pain    a. minimal CAD by cath in 2014 with 40% RI stenosis. b. low-risk NST in 11/2014.  Marland Kitchen Complication of anesthesia    hard to wake up after anesthesia, trouble turning head  . Depression   . Diabetes mellitus type 2, insulin dependent ([place])   . Diabetic neuropathy ([place])   . Diastolic dysfunction, left ventricle 11/30/14   EF 55%, grade 1 DD  . DVT (deep venous thrombosis) ([facility] [facility] [place])    "18 in RLE; 7 LLE prior to PE" (03/24/2015)  . Family history of adverse reaction to  anesthesia    " the whole family is hard to wake up"  . Glaucoma   . H/O hiatal hernia   . Hyperlipidemia   . Hypertension associated with diabetes ([place])   . Hypothyroidism   . Kidney stones    "passed them"  . Memory loss 06/03/2014  . Normal cardiac stress test 11/30/14   Negative Myoview  . On home oxygen therapy    "2L oxygen concentrator @ night"  . Osteoporosis   . Oxygen desaturation during sleep    wears 2 liters of oxygen at night   . Palpitations 35years ago   Cardionet monitor - revealed mostly normal sinus rhythm, sinus bradycardia with first-degree A-V block heart rates mostly in the 50s and 60s with some 70s. No arrhythmias, PVCs or PACs noted.  . PE (pulmonary thromboembolism) ([place]) x 3, last one was ~ 2003   history of recurrent RLE DVT with PE - last PE ~>13 yrs; Maintained on Plavix  . Pneumonia   . Presence of permanent cardiac pacemaker   . Restless legs syndrome   . Sleep apnea    cpap disontinued; test done 07/14/2008 ordered per  [place]  . Spinal headache   . Spinal stenosis    L 3,  L 4 and L 5, and C 1, C 2 and C3  . Stroke [facility]) 04-19-2013   tia and stroke. Weakness Rt hand  . 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 s/p [employer] PPM in 03/2015 by Dr. Lovena Le secondary to sinus node dysfunction.   Marland Kitchen TIA (transient ischemic attack) March 2014    Patient Active Problem List   Diagnosis Date Noted  . Pacemaker 04/26/2015  . Chronic daily headache 04/07/2015  . Symptomatic bradycardia 03/24/2015  . Sinus node dysfunction ([place]) 03/24/2015  . Chronic diastolic heart failure ([place])   . Diabetes mellitus type 2 in obese ([place])   . Normal coronary arteries 01/14/2015  . Normal cardiac stress test 11/30/2014  . Bradycardia 11/28/2014  . Type 2 diabetes mellitus with renal manifestations ([place]) 11/28/2014  . Morbid obesity ([place]) 11/28/2014  . CKD stage 3 due to type 2 diabetes mellitus ([place]) 11/28/2014  . Memory loss 06/03/2014  . AKI (acute kidney  injury) ([place])   . Chest pain 04/19/2014  . Pleuritic chest pain 04/19/2014  . Diabetes mellitus ([place]) 04/19/2014  . Edema of both legs 04/19/2014  . Dyspnea   . SOB (shortness of breath)   . Tremor of right hand 09/20/2013  . Headache(784.0) 09/19/2013  . Restless legs syndrome (RLS) 09/19/2013  . Constipation due to pain medication 08/21/2013  . Ankle edema 08/21/2013  . PVC's (premature ventricular contractions) 02/10/2013  . Diastolic dysfunction, left ventricle   . 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 PULMONARY EMBOLISM, HX OF 10/02/2009  . ALLERGIC RHINITIS 04/07/2009  . SLEEP APNEA 07/09/2008  . Hyperlipidemia with target LDL less than 130 05/20/2008  . Essential hypertension 05/20/2008    Past Surgical History:  Procedure Laterality Date  . ABDOMINAL HYSTERECTOMY    . APPENDECTOMY    . BACK SURGERY     steroid inj  . CARDIAC CATHETERIZATION  02/14/2012   no evidence of obstructive coronary disease ,low EDP with normal EF  . CATARACT EXTRACTION W/ INTRAOCULAR LENS  IMPLANT, BILATERAL Bilateral 2000s  . CHOLECYSTECTOMY    . DOPPLER ECHOCARDIOGRAPHY  2014; October 2016   a. EF 95-63%; normal diastolic pressures, no regional wall motion motion abnormalities;; b/ F 55-60%. Normal wall motion. GR 1 DD. No valve lesions  . EP IMPLANTABLE DEVICE N/A 03/24/2015   [employer] PPM, Dr. Lovena Le  . ESOPHAGOGASTRODUODENOSCOPY  08/09/2011   Procedure: ESOPHAGOGASTRODUODENOSCOPY (EGD);  Surgeon: Jeryl Columbia, MD;  Location: [facility];  Service: Endoscopy;  Laterality: N/A;  . ESOPHAGOGASTRODUODENOSCOPY (EGD) WITH PROPOFOL N/A 11/12/2013   Procedure: ESOPHAGOGASTRODUODENOSCOPY (EGD) WITH PROPOFOL;  Surgeon: Jeryl Columbia, MD;  Location: [facility] ENDOSCOPY;  Service: Endoscopy;  Laterality: N/A;  . ESOPHAGOGASTRODUODENOSCOPY (EGD) WITH PROPOFOL N/A 05/05/2016   Procedure: ESOPHAGOGASTRODUODENOSCOPY (EGD) WITH PROPOFOL;  Surgeon: Clarene Essex, MD;  Location: [facility] ENDOSCOPY;  Service: Endoscopy;   Laterality: N/A;  . GLAUCOMA SURGERY Bilateral 2000s   "laser"  . HOT HEMOSTASIS  08/09/2011   Procedure: HOT HEMOSTASIS (ARGON PLASMA COAGULATION/BICAP);  Surgeon: Jeryl Columbia, MD;  Location: [facility];  Service: Endoscopy;  Laterality: N/A;  . HOT HEMOSTASIS N/A 11/12/2013   Procedure: HOT HEMOSTASIS (ARGON PLASMA COAGULATION/BICAP);  Surgeon: Jeryl Columbia, MD;  Location: [facility];  Service: Endoscopy;  Laterality: N/A;  . INSERT / REPLACE / REMOVE PACEMAKER  03/24/2015  . JOINT REPLACEMENT    . KNEE SURGERY Left done with 2nd left knee replacement   spacer bar placement  . LEFT HEART CATHETERIZATION WITH CORONARY ANGIOGRAM N/A 02/14/2012  Procedure: LEFT HEART CATHETERIZATION WITH CORONARY ANGIOGRAM;  Surgeon: Leonie Man, MD;  Location[facility] CATH LAB;  Service: Cardiovascular;  Laterality: N/A;  . LEV DOPPLER  05/29/2012   RIGHT EXTREM. NORMAL VENOUS DUPLEX  . NM MYOCAR PERF WALL MOTION  01/26/2012; 11/2014   a. EF 79%,LV normal,ST SEGMENT CHANGE SUGGESTIVE OF ISCHEMIA.; LOW RISK, EF 60%. No ischemia or infarction. No wall motion abnormality   . PACEMAKER PLACEMENT    . REVISION TOTAL KNEE ARTHROPLASTY Left   . TONSILLECTOMY    . TOTAL KNEE ARTHROPLASTY Bilateral   . TRIGGER FINGER RELEASE  11/22/2011   Procedure: RELEASE TRIGGER FINGER/A-1 PULLEY;  Surgeon: Meredith Pel, MD;  Location: [facility];  Service: Orthopedics;  Laterality: Left;  Left trigger thumb release  . TRIGGER FINGER RELEASE Right yrs ago    OB History    No data available       Home Medications    Prior to Admission medications   Medication Sig Start Date End Date Taking? Authorizing Provider  acetaminophen (TYLENOL) 500 MG tablet Take 500 mg by mouth every 6 (six) hours as needed for moderate pain.     [provider]  albuterol (PROVENTIL HFA;VENTOLIN HFA) 108 (90 BASE) MCG/ACT inhaler Inhale 2 puffs into the lungs every 6 (six) hours as needed for wheezing.    [provider]    Biotin 1 MG CAPS Take 1 mg by mouth daily as needed (supplement).    [provider]  busPIRone (BUSPAR) 10 MG tablet Take 1 tablet (10 mg total) by mouth every morning. 06/03/14   Garvin Fila, MD  Cholecalciferol (VITAMIN D-3 PO) Take 1 tablet by mouth every morning.     [provider]  clonazePAM (KLONOPIN) 0.5 MG tablet Take 0.5-1 mg by mouth 2 (two) times daily. Takes 2 tabs in am and 1 tab in pm    [provider]  clopidogrel (PLAVIX) 75 MG tablet Take 75 mg by mouth every morning.    [provider]  dicyclomine (BENTYL) 10 MG capsule Take 10 mg by mouth 4 (four) times daily as needed for spasms.     [provider]  esomeprazole (NEXIUM) 40 MG capsule Take 40 mg by mouth daily before breakfast.    [provider]  furosemide (LASIX) 40 MG tablet Take 40 mg by mouth daily.    [provider]  glipiZIDE (GLUCOTROL) 10 MG tablet Take 10-20 mg by mouth 2 (two) times daily before a meal.  05/03/14   [provider]  insulin NPH (HUMULIN N,NOVOLIN N) 100 UNIT/ML injection Inject 30 Units into the skin daily before breakfast. Novolin-N    [provider]  insulin regular (NOVOLIN R,HUMULIN R) 100 units/mL injection Inject 20 Units into the skin 2 (two) times daily before a meal.     [provider]  levothyroxine (SYNTHROID, LEVOTHROID) 100 MCG tablet Take 100 mcg by mouth every morning.     [provider]  lidocaine (LIDODERM) 5 % Place 3 patches onto the skin every other day. Remove & Discard patch within 12 hours or as directed by MD    [provider]  LORazepam (ATIVAN) 0.5 MG tablet Take 0.5 mg by mouth daily as needed for anxiety.    [provider]  magnesium 30 MG tablet Take 30 mg by mouth daily.    [provider]  meclizine (ANTIVERT) 12.5 MG tablet Take 12.5 mg by mouth 3 (three) times daily as needed  for dizziness.     [provider]   nortriptyline (PAMELOR) 50 MG capsule TAKE 2 CAPSULES (100MG ) AT BEDTIME 06/02/16   Garvin Fila, MD  Plecanatide (TRULANCE PO) Take 1 capsule by mouth daily.     [provider]  polyethylene glycol (MIRALAX / GLYCOLAX) packet Take 17 g by mouth daily as needed for mild constipation.    [provider]  potassium chloride SA (K-DUR,KLOR-CON) 20 MEQ tablet Take 1 tablet (20 mEq total) by mouth 2 (two) times daily. Patient taking differently: Take 10 mEq by mouth 2 (two) times daily.  06/22/15   Davonna Belling, MD  rosuvastatin (CRESTOR) 20 MG tablet Take 1 tablet (20 mg total) by mouth every morning. 06/15/15   Leonie Man, MD  Sennosides (EX-LAX PO) Take 1 tablet by mouth daily as needed (constipation).     [provider]  sodium phosphate (FLEET) enema Place 1 enema rectally once as needed (constipation). follow package directions     [provider]  solifenacin (VESICARE) 10 MG tablet Take 10 mg by mouth daily as needed (overactive bladder).     [provider]  sucralfate (CARAFATE) 1 G tablet Take 1 g by mouth daily as needed (stomach upset).     [provider]  terconazole (TERAZOL 7) 0.4 % vaginal cream Place 1 applicator vaginally at bedtime as needed (for yeast infection).  04/18/14   [provider]  traMADol (ULTRAM) 50 MG tablet Take 1 tablet (50 mg total) by mouth every 12 (twelve) hours as needed for moderate pain. 03/27/15   Bhagat, Crista Luria, PA  valsartan (DIOVAN) 160 MG tablet Take 80 mg by mouth daily.    [provider]    Family History Family History  Problem Relation Age of Onset  . Cancer Mother   . Cancer - Prostate Father     Social History Social History  Substance Use Topics  . Smoking status: Never Smoker  . Smokeless tobacco: Never Used  . Alcohol use No     Allergies   Iodine; Iohexol; Metoclopramide hcl; Sulfa antibiotics; Lactose intolerance (gi); and  Lansoprazole   Review of Systems Review of Systems  Respiratory: Positive for cough and shortness of breath.   Cardiovascular: Positive for chest pain.  All other systems reviewed and are negative.    Physical Exam Updated Vital Signs BP (!) 125/57 (BP Location: Left Arm)   Pulse 62   Temp 97.9 F (36.6 C) (Oral)   Resp 15   Ht 5\' 7"  (1.702 m)   SpO2 100%   Physical Exam  Constitutional: She is oriented to person, place, and time. She appears well-developed and well-nourished.  HENT:  Head: Normocephalic and atraumatic.  Mouth/Throat: Oropharynx is clear and moist.  Eyes: Pupils are equal, round, and reactive to light. Conjunctivae and EOM are normal.  Neck: Normal range of motion.  Cardiovascular: Normal rate, regular rhythm and normal heart sounds.   Pulmonary/Chest: Effort normal and breath sounds normal. No respiratory distress. She has no wheezes. She has no rhonchi.  Lungs overall clear, no wheezes or rhonchi, no distress, able to speak in full sentences without difficulty  Abdominal: Soft. Bowel sounds are normal. There is no tenderness. There is no rebound.  Musculoskeletal: Normal range of motion.  Mild edema of both legs  Neurological: She is alert and oriented to person, place, and time.  Skin: Skin is warm and dry.  Psychiatric: She has a normal mood and affect.  Nursing note  and vitals reviewed.    ED Treatments / Results  Labs (all labs ordered are listed, but only abnormal results are displayed) Labs Reviewed  BASIC METABOLIC PANEL - Abnormal; Notable for the following:       Result Value   Potassium 3.3 (*)    Chloride 98 (*)    Creatinine, Ser 1.76 (*)    GFR calc non Af Amer 27 (*)    GFR calc Af Amer 31 (*)    All other components within normal limits  CBC - Abnormal; Notable for the following:    RBC 3.53 (*)    Hemoglobin 10.7 (*)    HCT 33.6 (*)    All other components within normal limits  I-STAT TROPONIN, ED  I-STAT TROPONIN, ED     EKG  EKG Interpretation  Date/Time:  Tuesday October 11 2016 01:32:32 EDT Ventricular Rate:  60 PR Interval:    QRS Duration: 115 QT Interval:  465 QTC Calculation: 465 R Axis:     Text Interpretation:  Atrial-paced complexes Prolonged PR interval Nonspecific intraventricular conduction delay Low voltage, precordial leads Consider anterior infarct Now paced Confirmed by Thayer Jew [phone]) on 10/11/2016 1:53:47 AM       Radiology Dg Chest 2 View  Result Date: 10/10/2016 CLINICAL DATA:  Chest pain and shortness of breath for 1 day. EXAM: CHEST  2 VIEW COMPARISON:  Radiograph 04/21/2016 FINDINGS: Left-sided pacemaker remains in place. The cardiomediastinal contours are unchanged. The lungs are clear. Pulmonary vasculature is normal. No consolidation, pleural effusion, or pneumothorax. No acute osseous abnormalities are seen. IMPRESSION: No acute abnormality. Electronically Signed   By: Jeb Levering M.D.   On: 10/10/2016 21:17    Procedures Procedures (including critical care time)  Medications Ordered in ED Medications - No data to display   Initial Impression / Assessment and Plan / ED Course  I have reviewed the triage vital signs and the nursing notes.  Pertinent labs & imaging results that were available during my care of the patient were reviewed by me and considered in my medical decision making (see chart for details).  76 y.o. F here with chest pain, SOB, and hemoptysis.  Has hx of recurrent DVT as well as PE.  Currently on Plavix, had to be taken off Coumadin due to hepatic impairment.  EKG without acute changes here. Screening labs obtained from triage are overall reassuring, troponin is negative. Chest x-ray is clear. Clinically does not appear significantly fluid overloaded on exam. She is not tachycardic or hypoxic, however given her history of recurrent DVTs and PE and new hemoptysis, PE must be considered. Patient unfortunately has suffered a cardiac arrest  from IV dye the past and requires VQ scan. This is unavailable at this hour.  Patient with multiple risk factors-- will admit for chest pain r/o, obtain VQ scan in the morning.  Discussed with Dr. Hal Hope-- will admit for ongoing care.  Final Clinical Impressions(s) / ED Diagnoses   Final diagnoses:  Chest pain, unspecified type    New Prescriptions New Prescriptions   No medications on file     Larene Pickett, PA-C 10/11/16 0203    Merryl Hacker, MD 10/12/16 [phone]

## 2016-10-10 NOTE — ED Triage Notes (Signed)
Pt c/o chest pain that radiates to the back and shortness of breath with exertion that began yesterday. Pt has hx PE/DVT, currently taking plavix, has not missed a dose.

## 2016-10-11 ENCOUNTER — Observation Stay (HOSPITAL_COMMUNITY): Payer: Medicare HMO

## 2016-10-11 ENCOUNTER — Other Ambulatory Visit: Payer: Self-pay

## 2016-10-11 ENCOUNTER — Encounter (HOSPITAL_COMMUNITY): Payer: Self-pay | Admitting: Internal Medicine

## 2016-10-11 DIAGNOSIS — I5032 Chronic diastolic (congestive) heart failure: Secondary | ICD-10-CM | POA: Diagnosis not present

## 2016-10-11 DIAGNOSIS — Z950 Presence of cardiac pacemaker: Secondary | ICD-10-CM

## 2016-10-11 DIAGNOSIS — R079 Chest pain, unspecified: Secondary | ICD-10-CM

## 2016-10-11 DIAGNOSIS — R0602 Shortness of breath: Secondary | ICD-10-CM | POA: Diagnosis not present

## 2016-10-11 DIAGNOSIS — E1121 Type 2 diabetes mellitus with diabetic nephropathy: Secondary | ICD-10-CM | POA: Diagnosis not present

## 2016-10-11 DIAGNOSIS — G4733 Obstructive sleep apnea (adult) (pediatric): Secondary | ICD-10-CM

## 2016-10-11 DIAGNOSIS — I10 Essential (primary) hypertension: Secondary | ICD-10-CM | POA: Diagnosis not present

## 2016-10-11 DIAGNOSIS — Z86718 Personal history of other venous thrombosis and embolism: Secondary | ICD-10-CM

## 2016-10-11 DIAGNOSIS — D649 Anemia, unspecified: Secondary | ICD-10-CM | POA: Diagnosis present

## 2016-10-11 LAB — GLUCOSE, CAPILLARY
Glucose-Capillary: 220 mg/dL — ABNORMAL HIGH (ref 65–99)
Glucose-Capillary: 198 mg/dL — ABNORMAL HIGH (ref 65–99)

## 2016-10-11 LAB — CREATININE, SERUM
Creatinine, Ser: 1.79 mg/dL — ABNORMAL HIGH (ref 0.44–1.00)
GFR calc Af Amer: 30 mL/min — ABNORMAL LOW (ref >60)
GFR calc non Af Amer: 26 mL/min — ABNORMAL LOW (ref >60)

## 2016-10-11 LAB — CBC
HCT: 31.9 % — ABNORMAL LOW (ref 36.0–46.0)
Hemoglobin: 10.2 g/dL — ABNORMAL LOW (ref 12.0–15.0)
MCH: 30.3 pg (ref 26.0–34.0)
MCHC: 32 g/dL (ref 30.0–36.0)
MCV: 94.7 fL (ref 78.0–100.0)
Platelets: 264 K/uL (ref 150–400)
RBC: 3.37 MIL/uL — ABNORMAL LOW (ref 3.87–5.11)
RDW: 12.9 % (ref 11.5–15.5)
WBC: 6.7 K/uL (ref 4.0–10.5)

## 2016-10-11 LAB — CBG MONITORING, ED
Glucose-Capillary: 124 mg/dL — ABNORMAL HIGH (ref 65–99)
Glucose-Capillary: 137 mg/dL — ABNORMAL HIGH (ref 65–99)
Glucose-Capillary: 208 mg/dL — ABNORMAL HIGH (ref 65–99)
Glucose-Capillary: 243 mg/dL — ABNORMAL HIGH (ref 65–99)

## 2016-10-11 LAB — I-STAT TROPONIN, ED: Troponin i, poc: 0.01 ng/mL (ref 0.00–0.08)

## 2016-10-11 LAB — TROPONIN I
Troponin I: <0.03 ng/mL (ref <0.03)
Troponin I: <0.03 ng/mL
Troponin I: <0.03 ng/mL (ref <0.03)

## 2016-10-11 MED ORDER — INSULIN NPH (HUMAN) (ISOPHANE) 100 UNIT/ML ~~LOC~~ SUSP
10.0000 [IU] | Freq: Two times a day (BID) | SUBCUTANEOUS | Status: DC
  Administered 2016-10-11 – 2016-10-14 (×6): 10 [IU] via SUBCUTANEOUS
  Filled 2016-10-11 (×2): qty 10

## 2016-10-11 MED ORDER — TECHNETIUM TO 99M ALBUMIN AGGREGATED
4.3000 | Freq: Once | INTRAVENOUS | Status: AC | PRN
  Administered 2016-10-11: 4.3 via INTRAVENOUS

## 2016-10-11 MED ORDER — IRBESARTAN 150 MG PO TABS
150.0000 mg | ORAL_TABLET | Freq: Every day | ORAL | Status: DC
  Administered 2016-10-12 – 2016-10-14 (×2): 150 mg via ORAL
  Filled 2016-10-11 (×4): qty 1

## 2016-10-11 MED ORDER — ACETAMINOPHEN 325 MG PO TABS
650.0000 mg | ORAL_TABLET | ORAL | Status: DC | PRN
Start: 2016-10-11 — End: 2016-10-14
  Administered 2016-10-13: 650 mg via ORAL

## 2016-10-11 MED ORDER — ONDANSETRON HCL 4 MG/2ML IJ SOLN: 4.0000 mg | Freq: Four times a day (QID) | INTRAMUSCULAR | Status: DC | PRN

## 2016-10-11 MED ORDER — NORTRIPTYLINE HCL 25 MG PO CAPS
100.0000 mg | ORAL_CAPSULE | Freq: Every day | ORAL | Status: DC
  Administered 2016-10-11 – 2016-10-12 (×2): 100 mg via ORAL
  Filled 2016-10-11 (×2): qty 4

## 2016-10-11 MED ORDER — MAGNESIUM OXIDE 400 (241.3 MG) MG PO TABS
400.0000 mg | ORAL_TABLET | Freq: Every day | ORAL | Status: DC
  Administered 2016-10-12: 400 mg via ORAL
  Filled 2016-10-11 (×4): qty 1

## 2016-10-11 MED ORDER — CLOPIDOGREL BISULFATE 75 MG PO TABS
75.0000 mg | ORAL_TABLET | Freq: Every morning | ORAL | Status: DC
  Administered 2016-10-11 – 2016-10-14 (×3): 75 mg via ORAL
  Filled 2016-10-11 (×4): qty 1

## 2016-10-11 MED ORDER — CLONAZEPAM 0.5 MG PO TABS
1.0000 mg | ORAL_TABLET | Freq: Every day | ORAL | Status: DC
  Administered 2016-10-11 – 2016-10-14 (×3): 1 mg via ORAL
  Filled 2016-10-11 (×4): qty 2

## 2016-10-11 MED ORDER — CLONAZEPAM 0.5 MG PO TABS: 0.5000 mg | ORAL_TABLET | Freq: Two times a day (BID) | ORAL | Status: DC

## 2016-10-11 MED ORDER — POLYETHYLENE GLYCOL 3350 17 G PO PACK: 17.0000 g | PACK | ORAL | Status: DC

## 2016-10-11 MED ORDER — LORAZEPAM 0.5 MG PO TABS: 0.5000 mg | ORAL_TABLET | Freq: Every day | ORAL | Status: DC | PRN

## 2016-10-11 MED ORDER — BIOTIN 1 MG PO CAPS: 1.0000 mg | ORAL_CAPSULE | Freq: Every day | ORAL | Status: DC | PRN

## 2016-10-11 MED ORDER — DARIFENACIN HYDROBROMIDE ER 7.5 MG PO TB24
7.5000 mg | ORAL_TABLET | Freq: Every day | ORAL | Status: DC
  Administered 2016-10-12 – 2016-10-14 (×2): 7.5 mg via ORAL
  Filled 2016-10-11 (×4): qty 1

## 2016-10-11 MED ORDER — TRAMADOL HCL 50 MG PO TABS
50.0000 mg | ORAL_TABLET | Freq: Two times a day (BID) | ORAL | Status: DC | PRN
  Administered 2016-10-11: 50 mg via ORAL
  Filled 2016-10-11: qty 1

## 2016-10-11 MED ORDER — ENOXAPARIN SODIUM 40 MG/0.4ML ~~LOC~~ SOLN
40.0000 mg | Freq: Every day | SUBCUTANEOUS | Status: DC
  Administered 2016-10-11 – 2016-10-14 (×3): 40 mg via SUBCUTANEOUS
  Filled 2016-10-11 (×5): qty 0.4

## 2016-10-11 MED ORDER — ALBUTEROL SULFATE (2.5 MG/3ML) 0.083% IN NEBU: 2.5000 mg | INHALATION_SOLUTION | Freq: Four times a day (QID) | RESPIRATORY_TRACT | Status: DC | PRN

## 2016-10-11 MED ORDER — TECHNETIUM TC 99M DIETHYLENETRIAME-PENTAACETIC ACID
31.7000 | Freq: Once | INTRAVENOUS | Status: AC | PRN
  Administered 2016-10-11: 31.7 via INTRAVENOUS

## 2016-10-11 MED ORDER — POTASSIUM CHLORIDE CRYS ER 10 MEQ PO TBCR
10.0000 meq | EXTENDED_RELEASE_TABLET | Freq: Two times a day (BID) | ORAL | Status: DC
  Administered 2016-10-11 – 2016-10-12 (×3): 10 meq via ORAL
  Filled 2016-10-11 (×6): qty 1

## 2016-10-11 MED ORDER — CLONAZEPAM 0.5 MG PO TABS
0.5000 mg | ORAL_TABLET | Freq: Every day | ORAL | Status: DC
  Administered 2016-10-11 – 2016-10-13 (×3): 0.5 mg via ORAL
  Filled 2016-10-11 (×3): qty 1

## 2016-10-11 MED ORDER — MORPHINE SULFATE (PF) 4 MG/ML IV SOLN: 0.5000 mg | INTRAVENOUS | Status: DC | PRN

## 2016-10-11 MED ORDER — INSULIN ASPART 100 UNIT/ML ~~LOC~~ SOLN
0.0000 [IU] | SUBCUTANEOUS | Status: DC
  Administered 2016-10-11: 2 [IU] via SUBCUTANEOUS
  Administered 2016-10-11 (×2): 3 [IU] via SUBCUTANEOUS
  Administered 2016-10-12: 2 [IU] via SUBCUTANEOUS
  Administered 2016-10-12: 3 [IU] via SUBCUTANEOUS
  Administered 2016-10-12: 2 [IU] via SUBCUTANEOUS
  Administered 2016-10-12: 1 [IU] via SUBCUTANEOUS
  Administered 2016-10-12: 5 [IU] via SUBCUTANEOUS
  Administered 2016-10-13: 2 [IU] via SUBCUTANEOUS
  Administered 2016-10-13: 5 [IU] via SUBCUTANEOUS
  Administered 2016-10-13: 2 [IU] via SUBCUTANEOUS
  Administered 2016-10-13 (×2): 3 [IU] via SUBCUTANEOUS
  Administered 2016-10-14: 1 [IU] via SUBCUTANEOUS
  Administered 2016-10-14: 5 [IU] via SUBCUTANEOUS
  Administered 2016-10-14: 1 [IU] via SUBCUTANEOUS
  Administered 2016-10-14 (×2): 2 [IU] via SUBCUTANEOUS
  Filled 2016-10-11: qty 1

## 2016-10-11 MED ORDER — FUROSEMIDE 40 MG PO TABS
40.0000 mg | ORAL_TABLET | ORAL | Status: DC
  Administered 2016-10-11: 40 mg via ORAL
  Filled 2016-10-11: qty 2
  Filled 2016-10-11: qty 1

## 2016-10-11 MED ORDER — PANTOPRAZOLE SODIUM 40 MG PO TBEC
40.0000 mg | DELAYED_RELEASE_TABLET | Freq: Every day | ORAL | Status: DC
  Administered 2016-10-11 – 2016-10-14 (×3): 40 mg via ORAL
  Filled 2016-10-11 (×5): qty 1

## 2016-10-11 MED ORDER — LEVOTHYROXINE SODIUM 100 MCG PO TABS
100.0000 ug | ORAL_TABLET | Freq: Every day | ORAL | Status: DC
  Administered 2016-10-12 – 2016-10-14 (×3): 100 ug via ORAL
  Filled 2016-10-11 (×4): qty 1

## 2016-10-11 MED ORDER — NITROGLYCERIN 0.4 MG SL SUBL: 0.4000 mg | SUBLINGUAL_TABLET | SUBLINGUAL | Status: DC | PRN

## 2016-10-11 MED ORDER — BUSPIRONE HCL 10 MG PO TABS
10.0000 mg | ORAL_TABLET | Freq: Every morning | ORAL | Status: DC
Start: 2016-10-11 — End: 2016-10-14
  Administered 2016-10-12: 10 mg via ORAL
  Filled 2016-10-11 (×3): qty 1

## 2016-10-11 MED ORDER — DICYCLOMINE HCL 10 MG PO CAPS
10.0000 mg | ORAL_CAPSULE | Freq: Two times a day (BID) | ORAL | Status: DC
  Administered 2016-10-11 – 2016-10-13 (×5): 10 mg via ORAL
  Filled 2016-10-11 (×7): qty 1

## 2016-10-11 NOTE — H&P (Signed)
History and Physical    Tara Jackson HDQ:[id number] DOB: Nov 12, 1940 DOA: 10/10/2016  PCP: Shon Baton, MD  Patient coming from: Home.  Chief Complaint: Chest pain.  HPI: Tara Jackson is a 76 y.o. female with history of hypertension, diastolic CHF, sinus node dysfunction status post pacemaker placement, history of recurrent pulmonary embolism and DVT not on anticoagulation secondary to history of liver issues presents to the ER with complaint of chest pain and shortness of breath over the last 2 days. Patient states over the last 2 days patient has been having central chest pain radiating to the back aching in nature at times stabbing. Also has some cough at times blood-tinged sputum.   ED Course: In the ER cardiac markers and EKG were not showing anything acute chest x-ray was unremarkable. Patient is being admitted for further observation.  Review of Systems: As per HPI, rest all negative.   Past Medical History:  Diagnosis Date  . Anemia   . Anxiety   . Arthritis   . Asthma   . Brittle bone disease   . Cataract   . Chest pain    a. minimal CAD by cath in 2014 with 40% RI stenosis. b. low-risk NST in 11/2014.  Marland Kitchen Complication of anesthesia    hard to wake up after anesthesia, trouble turning head  . Depression   . Diabetes mellitus type 2, insulin dependent ([place])   . Diabetic neuropathy ([place])   . Diastolic dysfunction, left ventricle 11/30/14   EF 55%, grade 1 DD  . DVT (deep venous thrombosis) ([place])    "18 in RLE; 7 LLE prior to PE" (03/24/2015)  . Family history of adverse reaction to anesthesia    " the whole family is hard to wake up"  . Glaucoma   . H/O hiatal hernia   . Hyperlipidemia   . Hypertension associated with diabetes ([place])   . Hypothyroidism   . Kidney stones    "passed them"  . Memory loss 06/03/2014  . Normal cardiac stress test 11/30/14   Negative Myoview  . On home oxygen therapy    "2L oxygen concentrator @ night"  . Osteoporosis   . Oxygen  desaturation during sleep    wears 2 liters of oxygen at night   . Palpitations 35years ago   Cardionet monitor - revealed mostly normal sinus rhythm, sinus bradycardia with first-degree A-V block heart rates mostly in the 50s and 60s with some 70s. No arrhythmias, PVCs or PACs noted.  . PE (pulmonary thromboembolism) ([place]) x 3, last one was ~ 2003   history of recurrent RLE DVT with PE - last PE ~>13 yrs; Maintained on Plavix  . Pneumonia   . Presence of permanent cardiac pacemaker   . Restless legs syndrome   . Sleep apnea    cpap disontinued; test done 07/14/2008 ordered per  Byrum  . Spinal headache   . Spinal stenosis    L 3, L 4 and L 5, and C 1, C 2 and C3  . Stroke [facility]) 04-19-2013   tia and stroke. Weakness Rt hand  . 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 s/p [employer] PPM in 03/2015 by Dr. Lovena Le secondary to sinus node dysfunction.   Marland Kitchen TIA (transient ischemic attack) March 2014    Past Surgical History:  Procedure Laterality Date  . ABDOMINAL HYSTERECTOMY    . APPENDECTOMY    . BACK SURGERY     steroid inj  . CARDIAC CATHETERIZATION  02/14/2012  no evidence of obstructive coronary disease ,low EDP with normal EF  . CATARACT EXTRACTION W/ INTRAOCULAR LENS  IMPLANT, BILATERAL Bilateral 2000s  . CHOLECYSTECTOMY    . DOPPLER ECHOCARDIOGRAPHY  2014; October 2016   a. EF 66-44%; normal diastolic pressures, no regional wall motion motion abnormalities;; b/ F 55-60%. Normal wall motion. GR 1 DD. No valve lesions  . EP IMPLANTABLE DEVICE N/A 03/24/2015   [facility] Scientific PPM, Dr. Lovena Le  . ESOPHAGOGASTRODUODENOSCOPY  08/09/2011   Procedure: ESOPHAGOGASTRODUODENOSCOPY (EGD);  Surgeon: Jeryl Columbia, MD;  Location: [facility];  Service: Endoscopy;  Laterality: N/A;  . ESOPHAGOGASTRODUODENOSCOPY (EGD) WITH PROPOFOL N/A 11/12/2013   Procedure: ESOPHAGOGASTRODUODENOSCOPY (EGD) WITH PROPOFOL;  Surgeon: Jeryl Columbia, MD;  Location: [facility] ENDOSCOPY;  Service: Endoscopy;   Laterality: N/A;  . ESOPHAGOGASTRODUODENOSCOPY (EGD) WITH PROPOFOL N/A 05/05/2016   Procedure: ESOPHAGOGASTRODUODENOSCOPY (EGD) WITH PROPOFOL;  Surgeon: Clarene Essex, MD;  Location: [facility] ENDOSCOPY;  Service: Endoscopy;  Laterality: N/A;  . GLAUCOMA SURGERY Bilateral 2000s   "laser"  . HOT HEMOSTASIS  08/09/2011   Procedure: HOT HEMOSTASIS (ARGON PLASMA COAGULATION/BICAP);  Surgeon: Jeryl Columbia, MD;  Location: [facility];  Service: Endoscopy;  Laterality: N/A;  . HOT HEMOSTASIS N/A 11/12/2013   Procedure: HOT HEMOSTASIS (ARGON PLASMA COAGULATION/BICAP);  Surgeon: Jeryl Columbia, MD;  Location: [facility];  Service: Endoscopy;  Laterality: N/A;  . INSERT / REPLACE / REMOVE PACEMAKER  03/24/2015  . JOINT REPLACEMENT    . KNEE SURGERY Left done with 2nd left knee replacement   spacer bar placement  . LEFT HEART CATHETERIZATION WITH CORONARY ANGIOGRAM N/A 02/14/2012   Procedure: LEFT HEART CATHETERIZATION WITH CORONARY ANGIOGRAM;  Surgeon: Leonie Man, MD;  Location[facility] C[facility];  Service: Cardiovascular;  Laterality: N/A;  . LEV DOPPLER  05/29/2012   RIGHT EXTREM. NORMAL VENOUS DUPLEX  . NM MYOCAR PERF WALL MOTION  01/26/2012; 11/2014   a. EF 79%,LV normal,ST SEGMENT CHANGE SUGGESTIVE OF ISCHEMIA.; LOW RISK, EF 60%. No ischemia or infarction. No wall motion abnormality   . PACEMAKER PLACEMENT    . REVISION TOTAL KNEE ARTHROPLASTY Left   . TONSILLECTOMY    . TOTAL KNEE ARTHROPLASTY Bilateral   . TRIGGER FINGER RELEASE  11/22/2011   Procedure: RELEASE TRIGGER FINGER/A-1 PULLEY;  Surgeon: Meredith Pel, MD;  Location: [place];  Service: Orthopedics;  Laterality: Left;  Left trigger thumb release  . TRIGGER FINGER RELEASE Right yrs ago     reports that she has never smoked. She has never used smokeless tobacco. She reports that she does not drink alcohol or use drugs.  Allergies  Allergen Reactions  . Iodine Other (See Comments)    ONLY IV--Makes unconscious  . Iohexol Other (See Comments)       Code: SOB, Desc: cardiac arrest w/ iv contrast, has never used 13 hr prep//alice calhoun, Onset Date: [id number]   . Metoclopramide Hcl Other (See Comments)    suicidal  . Sulfa Antibiotics Hives  . Lactose Intolerance (Gi) Diarrhea  . Lansoprazole Nausea And Vomiting    Family History  Problem Relation Age of Onset  . Cancer Mother   . Cancer - Prostate Father     Prior to Admission medications   Medication Sig Start Date End Date Taking? Authorizing Provider  acetaminophen (TYLENOL) 500 MG tablet Take 500 mg by mouth every 6 (six) hours as needed for moderate pain.    Yes [provider]  albuterol (PROVENTIL HFA;VENTOLIN HFA) 108 (90 BASE) MCG/ACT inhaler Inhale 2  puffs into the lungs every 6 (six) hours as needed for wheezing.   Yes [provider]  Biotin 1 MG CAPS Take 1 mg by mouth daily as needed (supplement).   Yes [provider]  busPIRone (BUSPAR) 10 MG tablet Take 1 tablet (10 mg total) by mouth every morning. 06/03/14  Yes Garvin Fila, MD  Cholecalciferol (VITAMIN D-3 PO) Take 1 tablet by mouth every morning.    Yes [provider]  clonazePAM (KLONOPIN) 0.5 MG tablet Take 0.5-1 mg by mouth 2 (two) times daily. Takes 2 tabs in am and 1 tab in pm   Yes [provider]  clopidogrel (PLAVIX) 75 MG tablet Take 75 mg by mouth every morning.   Yes [provider]  dicyclomine (BENTYL) 10 MG capsule Take 10 mg by mouth 2 (two) times daily.    Yes [provider]  esomeprazole (NEXIUM) 40 MG capsule Take 40 mg by mouth daily before breakfast.   Yes [provider]  furosemide (LASIX) 40 MG tablet Take 40 mg by mouth every other day.    Yes [provider]  glipiZIDE (GLUCOTROL) 10 MG tablet Take 20 mg by mouth 2 (two) times daily before a meal.  05/03/14  Yes [provider]  insulin NPH (HUMULIN N,NOVOLIN N) 100 UNIT/ML injection Inject 25 Units into the skin 3 (three) times daily.  Novolin-N   Yes [provider]  insulin regular (NOVOLIN R,HUMULIN R) 100 units/mL injection Inject 25 Units into the skin 3 (three) times daily.    Yes [provider]  irbesartan (AVAPRO) 150 MG tablet Take 150 mg by mouth daily.   Yes [provider]  levothyroxine (SYNTHROID, LEVOTHROID) 100 MCG tablet Take 100 mcg by mouth every morning.    Yes [provider]  lidocaine (LIDODERM) 5 % Place 3 patches onto the skin daily as needed (pain). Remove & Discard patch within 12 hours or as directed by MD   Yes [provider]  LORazepam (ATIVAN) 0.5 MG tablet Take 0.5 mg by mouth daily as needed for anxiety.   Yes [provider]  magnesium 30 MG tablet Take 30 mg by mouth daily.   Yes [provider]  nortriptyline (PAMELOR) 50 MG capsule TAKE 2 CAPSULES (100MG ) AT BEDTIME 06/02/16  Yes Garvin Fila, MD  Plecanatide (TRULANCE PO) Take 1 capsule by mouth every other day.    Yes [provider]  polyethylene glycol (MIRALAX / GLYCOLAX) packet Take 17 g by mouth every other day.    Yes [provider]  potassium chloride SA (K-DUR,KLOR-CON) 20 MEQ tablet Take 1 tablet (20 mEq total) by mouth 2 (two) times daily. Patient taking differently: Take 10 mEq by mouth 2 (two) times daily.  06/22/15  Yes Davonna Belling, MD  Sennosides (EX-LAX PO) Take 1 tablet by mouth daily as needed (constipation).    Yes [provider]  sodium phosphate (FLEET) enema Place 1 enema rectally once as needed (constipation). follow package directions    Yes [provider]  solifenacin (VESICARE) 10 MG tablet Take 10 mg by mouth daily as needed (overactive bladder).    Yes [provider]  terconazole (TERAZOL 7) 0.4 % vaginal cream Place 1 applicator vaginally at bedtime as needed (for yeast infection).  04/18/14  Yes [provider]  traMADol (ULTRAM) 50 MG tablet Take 1 tablet (50 mg total) by mouth every 12  (twelve) hours as needed for moderate pain. 03/27/15  Yes  Leanor Kail, [place]    Physical Exam: Vitals:   10/10/16 2249 10/11/16 0015 10/11/16 0130 10/11/16 0300  BP: (!) 125/57 133/64 (!) 127/58 (!) 121/59  Pulse: 62 60 60 63  Resp: 15 16 (!) 26 17  Temp:      TempSrc:      SpO2: 100% 100% 99% 99%  Height:          Constitutional: Moderately built and nourished. Vitals:   10/10/16 2249 10/11/16 0015 10/11/16 0130 10/11/16 0300  BP: (!) 125/57 133/64 (!) 127/58 (!) 121/59  Pulse: 62 60 60 63  Resp: 15 16 (!) 26 17  Temp:      TempSrc:      SpO2: 100% 100% 99% 99%  Height:       Eyes: Anicteric. No pallor. ENMT: No discharge from the ears eyes nose and mouth. Neck: No mass felt. No JVD appreciated. Respiratory: No rhonchi or crepitations. Cardiovascular: S1 and S2 heard no murmurs appreciated. Abdomen: Soft nontender bowel sounds present. Musculoskeletal: Mild edema of the both lower extremities. Skin: No rash or skin appears warm. Neurologic: Alert awake oriented to time place and person. Moves all extremities. Psychiatric: Appears normal. Normal affect.   Labs on Admission: I have personally reviewed following labs and imaging studies  CBC:  Recent Labs Lab 10/10/16 2003  WBC 7.0  HGB 10.7*  HCT 33.6*  MCV 95.2  PLT 496   Basic Metabolic Panel:  Recent Labs Lab 10/10/16 2003  NA 135  K 3.3*  CL 98*  CO2 27  GLUCOSE 97  BUN 20  CREATININE 1.76*  CALCIUM 9.0   GFR: CrCl cannot be calculated (Unknown ideal weight.). Liver Function Tests: No results for input(s): AST, ALT, ALKPHOS, BILITOT, PROT, ALBUMIN in the last 168 hours. No results for input(s): LIPASE, AMYLASE in the last 168 hours. No results for input(s): AMMONIA in the last 168 hours. Coagulation Profile: No results for input(s): INR, PROTIME in the last 168 hours. Cardiac Enzymes: No results for input(s): CKTOTAL, CKMB, CKMBINDEX, TROPONINI in the last 168 hours. BNP (last 3  results) No results for input(s): PROBNP in the last 8760 hours. HbA1C: No results for input(s): HGBA1C in the last 72 hours. CBG: No results for input(s): GLUCAP in the last 168 hours. Lipid Profile: No results for input(s): CHOL, HDL, LDLCALC, TRIG, CHOLHDL, LDLDIRECT in the last 72 hours. Thyroid Function Tests: No results for input(s): TSH, T4TOTAL, FREET4, T3FREE, THYROIDAB in the last 72 hours. Anemia Panel: No results for input(s): VITAMINB12, FOLATE, FERRITIN, TIBC, IRON, RETICCTPCT in the last 72 hours. Urine analysis:    Component Value Date/Time   COLORURINE YELLOW 06/12/2015 0848   APPEARANCEUR HAZY (A) 06/12/2015 0848   LABSPEC 1.011 06/12/2015 0848   PHURINE 5.5 06/12/2015 0848   GLUCOSEU NEGATIVE 06/12/2015 0848   HGBUR NEGATIVE 06/12/2015 0848   BILIRUBINUR NEGATIVE 06/12/2015 0848   KETONESUR NEGATIVE 06/12/2015 0848   PROTEINUR NEGATIVE 06/12/2015 0848   UROBILINOGEN 1.0 11/28/2014 1125   NITRITE NEGATIVE 06/12/2015 0848   LEUKOCYTESUR NEGATIVE 06/12/2015 0848   Sepsis Labs: @LABRCNTIP (procalcitonin:4,lacticidven:4) )No results found for this or any previous visit (from the past 240 hour(s)).   Radiological Exams on Admission: Dg Chest 2 View  Result Date: 10/10/2016 CLINICAL DATA:  Chest pain and shortness of breath for 1 day. EXAM: CHEST  2 VIEW COMPARISON:  Radiograph 04/21/2016 FINDINGS: Left-sided pacemaker remains in place. The cardiomediastinal contours are unchanged. The lungs are clear. Pulmonary vasculature is normal. No consolidation, pleural  effusion, or pneumothorax. No acute osseous abnormalities are seen. IMPRESSION: No acute abnormality. Electronically Signed   By: Jeb Levering M.D.   On: 10/10/2016 21:17    EKG: Independently reviewed. Atrial paced rhythm.  Assessment/Plan Principal Problem:   Chest pain Active Problems:   Essential hypertension   Sleep apnea   PULMONARY EMBOLISM, HX OF   Type 2 diabetes mellitus with renal  manifestations (HCC)   Morbid obesity (HCC)   Chronic diastolic heart failure (HCC)   Pacemaker   Chronic anemia    1. Chest pain - will cycle cardiac markers. Patient is already on Plavix and Crestor. Check 2-D echo and I have requested cardiology consult. Since patient has history of recurrent pulmonary embolism and DVT will check VQ scan since patient is allergic to IV dye. 2. Hypertension on ARB. 3. Chronic diastolic CHF with last EF measured in 2016 was 55-60% with grade 1 diastolic dysfunction on Lasix. Appears euvolemic. 4. Hypothyroidism on Synthroid. 5. Hyperlipidemia on statins. 6. History of recurrent PE and DVT - patient states he was taken off anticoagulation secondary to liver issues. Check VQ scan. 7. Chronic kidney disease stage III creatinine appears to be at baseline. 8. Chronic anemia probably from kidney disease - follow CBCs and patient also had mild hemoptysis. 9. Sinus node dysfunction status post pacemaker placement.  I reviewed patient's old charts and labs.   DVT prophylaxis: Lovenox. Code Status: Full code.  Family Communication: Husband at the bedside.  Disposition Plan: Home.  Consults called: Cardiology.  Admission status: Observation.    Rise Patience MD [facility] Pager [phone].  If 7PM-7AM, please contact night-coverage [email or web address] Password [email or web address]  10/11/2016, 3:42 AM

## 2016-10-11 NOTE — ED Notes (Signed)
Dr. Bonner Puna contactted to clarify insulin orders. States to hold 8 am dose of sliding scale and then continue sliding scale with cbg as ordered.

## 2016-10-11 NOTE — ED Notes (Signed)
Pt to v/q scan.

## 2016-10-11 NOTE — Consult Note (Signed)
The patient has been seen in conjunction with Charlotta Newton, NP-C. All aspects of care have been considered and discussed. The patient has been personally interviewed, examined, and all clinical data has been reviewed.   She has had recurring chest pain over the years. Coronary angiography in 2014 revealed non-obstructive disease. Myocardial perfusion imaging in 2016 was low risk/normal. Presents now with prolonged substernal discomfort lasting days without evidence of ischemia by EKG or injury by serial cardiac markers. Discomfort is continuous even at the time of exam.  Exam is unremarkable.  ECG is nonischemic and troponins negative 2.  I doubt this is chest discomfort related to myocardial ischemia. We will perform an echocardiogram to assess aorta and regional wall motion. We'll also perform a repeat myocardial perfusion study given diabetes mellitus type 2 and previous documentation of nonobstructive disease.    Cardiology Consultation:   Patient ID: Tara Jackson; [id number]; 1941/01/06   Admit date: 10/10/2016 Date of Consult: 10/11/2016  Primary Care Provider: Shon Baton, MD Primary Cardiologist: Dr. Ellyn Hack Primary Electrophysiologist:  Dr. Lovena Le   Patient Profile:   Tara Jackson is a 76 y.o. female with a hx of PE/DVT, sinus node dysfunction (S/P PPM 03/2015), hypertension, hyperlipidemia, and normal coronary arteries by cath in 2014 who is being seen today for the evaluation of chest pain at the request of Dr. Bonner Puna.  History of Present Illness:   Tara Jackson presented to the [facility] emergency department with a 2 day history of chest pain and shortness of breath. On Saturday she developed a pressure and pain in her epigastric area that went up to her mid chest. This has been steady with intermittent increase in intensity lasting up to a few hours. She also has increase in DOE with minimal exertion and has been using her oxygen more. She denies orthopnea or PND or  palpitations. She also coughed up some blood tinged sputum 3 days ago. She has has an upper respiratory illness with cough, nasal congestion and chills.   The patient was last seen in the office on 04/15/2016 by Melvyn Neth, PA for pleuritic chest pain in the setting of productive cough, fevers and chills. She was noted to use an electronic wheelchair for transportation and was ambulating minimally around the house. She was using home oxygen. She was on Plavix due to a history of PE/DVT. She had been on Coumadin in the past but that was discontinued due to elevated LFTs.   Significant findings: Troponins:  0.02, 0.01, <0.03 EKG: Atrial pacing at 60 bpm, no acute ischemia Chest x-ray: No acute abnormality Hemoglobin 10.7 SCr  1.76,   K+ 3.3 VQ scan showed no evidence of pulmonary embolus  Past Medical History:  Diagnosis Date  . Anemia   . Anxiety   . Arthritis   . Asthma   . Brittle bone disease   . Cataract   . Chest pain    a. minimal CAD by cath in 2014 with 40% RI stenosis. b. low-risk NST in 11/2014.  Marland Kitchen Complication of anesthesia    hard to wake up after anesthesia, trouble turning head  . Depression   . Diabetes mellitus type 2, insulin dependent ([place])   . Diabetic neuropathy ([place])   . Diastolic dysfunction, left ventricle 11/30/14   EF 55%, grade 1 DD  . DVT (deep venous thrombosis) ([place])    "18 in RLE; 7 LLE prior to PE" (03/24/2015)  . Family history of adverse reaction to anesthesia    "  the whole family is hard to wake up"  . Glaucoma   . H/O hiatal hernia   . Hyperlipidemia   . Hypertension associated with diabetes ([place])   . Hypothyroidism   . Kidney stones    "passed them"  . Memory loss 06/03/2014  . Normal cardiac stress test 11/30/14   Negative Myoview  . On home oxygen therapy    "2L oxygen concentrator @ night"  . Osteoporosis   . Oxygen desaturation during sleep    wears 2 liters of oxygen at night   . Palpitations 35years ago   Cardionet monitor -  revealed mostly normal sinus rhythm, sinus bradycardia with first-degree A-V block heart rates mostly in the 50s and 60s with some 70s. No arrhythmias, PVCs or PACs noted.  . PE (pulmonary thromboembolism) ([place]) x 3, last one was ~ 2003   history of recurrent RLE DVT with PE - last PE ~>13 yrs; Maintained on Plavix  . Pneumonia   . Presence of permanent cardiac pacemaker   . Restless legs syndrome   . Sleep apnea    cpap disontinued; test done 07/14/2008 ordered per  [place]  . Spinal headache   . Spinal stenosis    L 3, L 4 and L 5, and C 1, C 2 and C3  . Stroke [facility]) 04-19-2013   tia and stroke. Weakness Rt hand  . 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 s/p [employer] PPM in 03/2015 by Dr. Lovena Le secondary to sinus node dysfunction.   Marland Kitchen TIA (transient ischemic attack) March 2014    Past Surgical History:  Procedure Laterality Date  . ABDOMINAL HYSTERECTOMY    . APPENDECTOMY    . BACK SURGERY     steroid inj  . CARDIAC CATHETERIZATION  02/14/2012   no evidence of obstructive coronary disease ,low EDP with normal EF  . CATARACT EXTRACTION W/ INTRAOCULAR LENS  IMPLANT, BILATERAL Bilateral 2000s  . CHOLECYSTECTOMY    . DOPPLER ECHOCARDIOGRAPHY  2014; October 2016   a. EF 98-92%; normal diastolic pressures, no regional wall motion motion abnormalities;; b/ F 55-60%. Normal wall motion. GR 1 DD. No valve lesions  . EP IMPLANTABLE DEVICE N/A 03/24/2015   [facility] PPM, Dr. Lovena Le  . ESOPHAGOGASTRODUODENOSCOPY  08/09/2011   Procedure: ESOPHAGOGASTRODUODENOSCOPY (EGD);  Surgeon: Jeryl Columbia, MD;  Location: [facility];  Service: Endoscopy;  Laterality: N/A;  . ESOPHAGOGASTRODUODENOSCOPY (EGD) WITH PROPOFOL N/A 11/12/2013   Procedure: ESOPHAGOGASTRODUODENOSCOPY (EGD) WITH PROPOFOL;  Surgeon: Jeryl Columbia, MD;  Location: [facility] ENDOSCOPY;  Service: Endoscopy;  Laterality: N/A;  . ESOPHAGOGASTRODUODENOSCOPY (EGD) WITH PROPOFOL N/A 05/05/2016   Procedure: ESOPHAGOGASTRODUODENOSCOPY  (EGD) WITH PROPOFOL;  Surgeon: Clarene Essex, MD;  Location[facility] ENDOSCOPY;  Service: Endoscopy;  Laterality: N/A;  . GLAUCOMA SURGERY Bilateral 2000s   "laser"  . HOT HEMOSTASIS  08/09/2011   Procedure: HOT HEMOSTASIS (ARGON PLASMA COAGULATION/BICAP);  Surgeon: Jeryl Columbia, MD;  Location: [facility];  Service: Endoscopy;  Laterality: N/A;  . HOT HEMOSTASIS N/A 11/12/2013   Procedure: HOT HEMOSTASIS (ARGON PLASMA COAGULATION/BICAP);  Surgeon: Jeryl Columbia, MD;  Location: [facility];  Service: Endoscopy;  Laterality: N/A;  . INSERT / REPLACE / REMOVE PACEMAKER  03/24/2015  . JOINT REPLACEMENT    . KNEE SURGERY Left done with 2nd left knee replacement   spacer bar placement  . LEFT HEART CATHETERIZATION WITH CORONARY ANGIOGRAM N/A 02/14/2012   Procedure: LEFT HEART CATHETERIZATION WITH CORONARY ANGIOGRAM;  Surgeon: Leonie Man, MD;  Location: [facility] CATH  LAB;  Service: Cardiovascular;  Laterality: N/A;  . LEV DOPPLER  05/29/2012   RIGHT EXTREM. NORMAL VENOUS DUPLEX  . NM MYOCAR PERF WALL MOTION  01/26/2012; 11/2014   a. EF 79%,LV normal,ST SEGMENT CHANGE SUGGESTIVE OF ISCHEMIA.; LOW RISK, EF 60%. No ischemia or infarction. No wall motion abnormality   . PACEMAKER PLACEMENT    . REVISION TOTAL KNEE ARTHROPLASTY Left   . TONSILLECTOMY    . TOTAL KNEE ARTHROPLASTY Bilateral   . TRIGGER FINGER RELEASE  11/22/2011   Procedure: RELEASE TRIGGER FINGER/A-1 PULLEY;  Surgeon: Meredith Pel, MD;  Location: [place];  Service: Orthopedics;  Laterality: Left;  Left trigger thumb release  . TRIGGER FINGER RELEASE Right yrs ago     Home Medications:  Prior to Admission medications   Medication Sig Start Date End Date Taking? Authorizing Provider  acetaminophen (TYLENOL) 500 MG tablet Take 500 mg by mouth every 6 (six) hours as needed for moderate pain.    Yes [provider]  albuterol (PROVENTIL HFA;VENTOLIN HFA) 108 (90 BASE) MCG/ACT inhaler Inhale 2 puffs into the lungs every 6 (six) hours as  needed for wheezing.   Yes [provider]  Biotin 1 MG CAPS Take 1 mg by mouth daily as needed (supplement).   Yes [provider]  busPIRone (BUSPAR) 10 MG tablet Take 1 tablet (10 mg total) by mouth every morning. 06/03/14  Yes Garvin Fila, MD  Cholecalciferol (VITAMIN D-3 PO) Take 1 tablet by mouth every morning.    Yes [provider]  clonazePAM (KLONOPIN) 0.5 MG tablet Take 0.5-1 mg by mouth 2 (two) times daily. Takes 2 tabs in am and 1 tab in pm   Yes [provider]  clopidogrel (PLAVIX) 75 MG tablet Take 75 mg by mouth every morning.   Yes [provider]  dicyclomine (BENTYL) 10 MG capsule Take 10 mg by mouth 2 (two) times daily.    Yes [provider]  esomeprazole (NEXIUM) 40 MG capsule Take 40 mg by mouth daily before breakfast.   Yes [provider]  furosemide (LASIX) 40 MG tablet Take 40 mg by mouth every other day.    Yes [provider]  glipiZIDE (GLUCOTROL) 10 MG tablet Take 20 mg by mouth 2 (two) times daily before a meal.  05/03/14  Yes [provider]  insulin NPH (HUMULIN N,NOVOLIN N) 100 UNIT/ML injection Inject 25 Units into the skin 3 (three) times daily. Novolin-N   Yes [provider]  insulin regular (NOVOLIN R,HUMULIN R) 100 units/mL injection Inject 25 Units into the skin 3 (three) times daily.    Yes [provider]  irbesartan (AVAPRO) 150 MG tablet Take 150 mg by mouth daily.   Yes [provider]  levothyroxine (SYNTHROID, LEVOTHROID) 100 MCG tablet Take 100 mcg by mouth every morning.    Yes [provider]  lidocaine (LIDODERM) 5 % Place 3 patches onto the skin daily as needed (pain). Remove & Discard patch within 12 hours or as directed by MD   Yes [provider]  LORazepam (ATIVAN) 0.5 MG tablet Take 0.5 mg by mouth daily as needed for anxiety.   Yes [provider]  magnesium 30 MG tablet Take 30 mg by mouth daily.   Yes  [provider]  nortriptyline (PAMELOR) 50 MG capsule TAKE 2 CAPSULES (100MG ) AT BEDTIME 06/02/16  Yes Garvin Fila, MD  Plecanatide (TRULANCE PO) Take 1 capsule by mouth every other day.  Yes [provider]  polyethylene glycol (MIRALAX / GLYCOLAX) packet Take 17 g by mouth every other day.    Yes [provider]  potassium chloride SA (K-DUR,KLOR-CON) 20 MEQ tablet Take 1 tablet (20 mEq total) by mouth 2 (two) times daily. Patient taking differently: Take 10 mEq by mouth 2 (two) times daily.  06/22/15  Yes Davonna Belling, MD  Sennosides (EX-LAX PO) Take 1 tablet by mouth daily as needed (constipation).    Yes [provider]  sodium phosphate (FLEET) enema Place 1 enema rectally once as needed (constipation). follow package directions    Yes [provider]  solifenacin (VESICARE) 10 MG tablet Take 10 mg by mouth daily as needed (overactive bladder).    Yes [provider]  terconazole (TERAZOL 7) 0.4 % vaginal cream Place 1 applicator vaginally at bedtime as needed (for yeast infection).  04/18/14  Yes [provider]  traMADol (ULTRAM) 50 MG tablet Take 1 tablet (50 mg total) by mouth every 12 (twelve) hours as needed for moderate pain. 03/27/15  Yes Bhagat, Crista Luria, PA    Inpatient Medications: Scheduled Meds: . busPIRone  10 mg Oral q morning - 10a  . clonazePAM  1 mg Oral Daily   And  . clonazePAM  0.5 mg Oral QHS  . clopidogrel  75 mg Oral q morning - 10a  . darifenacin  7.5 mg Oral Daily  . dicyclomine  10 mg Oral BID  . enoxaparin (LOVENOX) injection  40 mg Subcutaneous Daily  . furosemide  40 mg Oral QODAY  . insulin aspart  0-9 Units Subcutaneous Q4H  . insulin NPH Human  10 Units Subcutaneous BID AC & HS  . irbesartan  150 mg Oral Daily  . levothyroxine  100 mcg Oral QAC breakfast  . magnesium oxide  400 mg Oral Daily  . nortriptyline  100 mg Oral QHS  . polyethylene glycol  17 g Oral QODAY  .  potassium chloride SA  10 mEq Oral BID   Continuous Infusions:  PRN Meds: acetaminophen, albuterol, LORazepam, morphine injection, ondansetron (ZOFRAN) IV, traMADol  Allergies:    Allergies  Allergen Reactions  . Iodine Other (See Comments)    ONLY IV--Makes unconscious  . Iohexol Other (See Comments)     Code: SOB, Desc: cardiac arrest w/ iv contrast, has never used 13 hr prep//alice calhoun, Onset Date: [id number]   . Metoclopramide Hcl Other (See Comments)    suicidal  . Sulfa Antibiotics Hives  . Lactose Intolerance (Gi) Diarrhea  . Lansoprazole Nausea And Vomiting    Social History:   Social History   Social History  . Marital status: Married    Spouse name: [name]  . Number of children: 2  . Years of education: college   Occupational History  . retired    Social History Main Topics  . Smoking status: Never Smoker  . Smokeless tobacco: Never Used  . Alcohol use No  . Drug use: No  . Sexual activity: Yes   Other Topics Concern  . Not on file   Social History Narrative   Married [name].) married 6 years.   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        Family History:    Family History  Problem Relation Age of Onset  . Cancer Mother   . Cancer - Prostate Father      ROS:  Please see the history of present illness.  ROS  All other ROS reviewed and negative.     Physical Exam/Data:   Vitals:   10/11/16 0730 10/11/16 0900 10/11/16 0945 10/11/16 1115  BP: (!) 169/134 (!) 147/54 (!) 148/76 (!) 132/58  Pulse: 72 76  75  Resp: 20 (!) 21 (!) 23 (!) 23  Temp:      TempSrc:      SpO2: 99% 100%  99%  Height:       No intake or output data in the 24 hours ending 10/11/16 1121 There were no vitals filed for this visit. There is no height or weight on file to calculate BMI.  General:  Obese elderly female, in no acute distress HEENT: normal Lymph: no adenopathy Neck: no JVD Endocrine:  No thryomegaly Vascular:  No carotid bruits; FA pulses 2+ bilaterally without bruits  Cardiac:  normal S1, S2; RRR; no murmur  Lungs:  clear to auscultation bilaterally, no wheezing, rhonchi or rales  Abd: soft, nontender, no hepatomegaly  Ext: no edema Musculoskeletal:  No deformities, BUE and BLE strength normal and equal Skin: warm and dry  Neuro:  CNs 2-12 intact, no focal abnormalities noted Psych:  Normal affect   EKG:  The EKG was personally reviewed and demonstrates:  Atrial pacing at 60 bpm, no acute ischemia  Telemetry:  Telemetry was personally reviewed and demonstrates:  Sinus rhtyhm in the 70's with occ V pacing  Relevant CV Studies:  Myocardial perfusion imaging 11/30/2014 1. No significant myocardial perfusion defects identified.  2. Normal left ventricular wall motion.  3. Left ventricular ejection fraction 60%  4. Low-risk stress test findings*. --------------------------------------------------------------------------------------------------------  Echocardiogram 11/28/2014 Study Conclusions - Left ventricle: The cavity size was normal. Systolic function was   normal. The estimated ejection fraction was in the range of 55%   to 60%. Wall motion was normal; there were no regional wall   motion abnormalities. Doppler parameters are consistent with   abnormal left ventricular relaxation (grade 1 diastolic   dysfunction).  Impressions: - Compared to the prior study, there has been no significant   interval change. --------------------------------------------------------------------------------------------------------- Cardiac catheterization 02/14/2012 Coronary Anatomy:  Left Main:  Large caliber vessel trifurcates after long possible segment into the the LAD, Ramus Intermedius, and Circumflex  LAD:  Large-caliber vessel that tapers into a relatively small vessel before reaching the apex. There is diffuse mild luminal irregularities. The main branch from this vessel is a large  septal trunk and several smaller septals. Gross small diagonal branches but nothing significant.  Left Circumflex:  Moderate-to- large-caliber vessel that is nondominant. There is a small proximal OM branch before it gives off the AV groove circumflex. It then terminates in a lateral OM that gives off 2 branches. The AV groove circumflex place gives off a atrial branch. There mild luminal irregularities throughout but nothing significant.  Ramus intermedius:  Large-caliber vessel that is at least as big as the LAD and essentially serves the purposes of the obtuse marginal and diagonal branches. It gives off a very proximal branch that is serves as a diagonal distribution as well as next branch which is a more OM that distribution. As a small branch with the long tubular 40% stenosis. The ramus that continues on normal to the apex. There is mild luminal irregularities may be a 40% lesion in the midportion as long tubular. Nothing significant explain the ischemia.   RCA:  Large caliber dominant vessel with 1 small and 1 moderate caliber RV marginal branches. The vessel then bifurcates distally into the  posterior descending artery and the right posterior AV groove branch. There is minimal luminal irregularities throughout.  RPDA:  At least moderate caliber vessel it reaches all again the apex. Mild luminal irregularities.  RPL Sysytem:The Right Posterior AV Groove Branch  is a moderate caliber vessel that gives off 2 small to moderate posterior lateral branches within the most distal one extending and all the way to the anterolateral wall. No evidence of any stenosis explain inferolateral ischemia.  POST-OPERATIVE DIAGNOSIS:    No evidence of obstructive CAD.  Low EDP with known normal EF  False Positive Myoview ST.  Laboratory Data:  Chemistry Recent Labs Lab 10/10/16 2003 10/11/16 0347  NA 135  --   K 3.3*  --   CL 98*  --   CO2 27  --   GLUCOSE 97  --   BUN 20  --   CREATININE 1.76*  1.79*  CALCIUM 9.0  --   GFRNONAA 27* 26*  GFRAA 31* 30*  ANIONGAP 10  --     No results for input(s): PROT, ALBUMIN, AST, ALT, ALKPHOS, BILITOT in the last 168 hours. Hematology Recent Labs Lab 10/10/16 2003 10/11/16 0347  WBC 7.0 6.7  RBC 3.53* 3.37*  HGB 10.7* 10.2*  HCT 33.6* 31.9*  MCV 95.2 94.7  MCH 30.3 30.3  MCHC 31.8 32.0  RDW 12.9 12.9  PLT 270 264   Cardiac Enzymes Recent Labs Lab 10/11/16 0347  TROPONINI <0.03    Recent Labs Lab 10/10/16 2015 10/11/16 0056  TROPIPOC 0.02 0.01    BNPNo results for input(s): BNP, PROBNP in the last 168 hours.  DDimer No results for input(s): DDIMER in the last 168 hours.  Radiology/Studies:  Dg Chest 2 View  Result Date: 10/10/2016 CLINICAL DATA:  Chest pain and shortness of breath for 1 day. EXAM: CHEST  2 VIEW COMPARISON:  Radiograph 04/21/2016 FINDINGS: Left-sided pacemaker remains in place. The cardiomediastinal contours are unchanged. The lungs are clear. Pulmonary vasculature is normal. No consolidation, pleural effusion, or pneumothorax. No acute osseous abnormalities are seen. IMPRESSION: No acute abnormality. Electronically Signed   By: Jeb Levering M.D.   On: 10/10/2016 21:17   Nm Pulmonary Vent And Perf (v/q Scan)  Result Date: 10/11/2016 CLINICAL DATA:  Shortness of Breath EXAM: NUCLEAR MEDICINE VENTILATION - PERFUSION LUNG SCAN TECHNIQUE: Ventilation images were obtained in multiple projections using inhaled aerosol Tc-29m DTPA. Perfusion images were obtained in multiple projections after intravenous injection of Tc-58m MAA. RADIOPHARMACEUTICALS:  31.7 mCi Technetium-63m DTPA aerosol inhalation and 4.3 mCi Technetium-55m MAA IV COMPARISON:  Chest x-ray 10/10/2016 FINDINGS: Ventilation: No focal ventilation defect. Perfusion: No wedge shaped peripheral perfusion defects to suggest acute pulmonary embolism. IMPRESSION: No evidence of pulmonary embolus. Electronically Signed   By: Rolm Baptise M.D.   On: 10/11/2016  10:48    Assessment and Plan:   Chest pain -Patient has a history of hypertensive heart disease. Cardiac catheterization in 02/2012 revealed no evidence of obstructive CAD. The patient also had a low risk nuclear stress test in 11/2014. -Her discomfort started in the epigastric region and runs up to her mid chest, is constant and associated with mild increase in DOE. No nausea or recent diet changes. She's had a URI for several days.  -Troponins negative 3 -EKG without acute ischemic changes -Chest x-ray without abnormality -VQ scan showed no evidence of pulmonary embolus -Patient is currently treated with Plavix for history of DVT/PE, no longer on Coumadin due to elevated LFTs -This chest pain seems  atypical in the setting of URI and there is no objective evidence of ischemia on EKG or cardiac enzymes. Would favor non-invasive workup with nuclear stress test. We will order nuclear stress test for tomorrow.   Hypertensive heart disease -Echocardiogram in 11/2014 showed a preserved EF of 55-60% with grade 1 DD -Repeat echocardiogram pending -The patient appears euvolemic with clear lungs and chest xray. No significant edema.  -There is an echocardiogram ordered.   Hypertension -Home therapy includes irbesartan 150 mg, Lasix 40 mg QOD -Blood pressure is well controlled  CKD -SCr 1.76, at baseline  Hypokalemia -Potassium level 3.3 on admission -Replenish potassium as needed  Sinus node dysfunction -Status post PPM placement in 03/2015 -Followed by Dr. Lovena Le -Last interrogation of PPM showed normal device function with appropriate histograms  History of PE/DVT -Had been on Coumadin in the past but this was discontinued due to elevated LFTs -Switched to Plavix and denies any evidence of active bleeding  Hyperlipidemia -On Crestor 20 mg daily -Followed by PCP  Signed, Daune Perch, NP  10/11/2016 11:21 AM

## 2016-10-11 NOTE — ED Notes (Signed)
Dr. Bonner Puna paged re pt request for other pain meds.

## 2016-10-11 NOTE — ED Notes (Signed)
Pt wheeled to the bathroom, nad

## 2016-10-11 NOTE — Progress Notes (Signed)
PROGRESS NOTE  Subjective: Tara Jackson is a 76 y.o. 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 This remained with intermittent worsening, initially attributed to URI with cough, though when the cough caused some blood-tinged sputum and she became more short of breath, she worried for PE and came to the ED. She was not hypoxic, but concern for PE was such that V/Q scan was performed (due to iodine allergy) which showed no evidence of mismatch/PE. She was placed in observation earlier this morning for ACS rule out. Troponins were negative and ECG showed no evidence of acute ischemia. Cardiology was consulted, echocardiogram is pending, and she will undergo stress testing tomorrow.  She tells me her chest pain is improved  Objective: BP 123/61 (BP Location: Right Arm)   Pulse 68   Temp 98.3 F (36.8 C) (Oral)   Resp 18   Ht 5' 7.5" (1.715 m)   Wt 126.6 kg (279 lb 1.6 oz)   SpO2 99%   BMI 43.07 kg/m   Gen: Obese, pleasant female in no distress Pulm: Clear and nonlabored on room air  CV: RRR, no murmur, no JVD, no edema GI: Soft, NT, ND, +BS, obese Neuro: Alert and oriented. No focal deficits. Skin: No rashes, lesions no ulcers  Assessment & Plan: Chest pain: Nonobstructive findings on LHC in 2014 and low risk stress testing 2016. Troponins negative thus far and ECG nonischemic. Noncardiac explanations include GI with epigastric location of pain and recent URI with persistent cough. - Cardiology consulted, recommending echo (ordered) and stress testing for ACS rule out 9/5.  - Continue plavix, statin  History of recurrent DVT and PE: No longer on coumadin due to hepatic impairment, she's been compliant with plavix. V/Q scan showed no evidence of PE, and there are no symptoms consistent with DVT.  - Continue plavix, and prophylactic dose  of lovenox while hospitalized.   Essential HTN:  - Continue ARB  Chronic HFpEF: Last echo 2016 EF 55-60%, G1DD. Appears euvolemic - Continue lasix 40mg  QOD - Monitor I/O, weights - Update echo as above  IDT2DM: Last HbA1c 8.6% Feb 2017.  - Recheck HbA1c - Hold home glipizide, will give SSI q4h and long acting insulin.   Morbid obesity: BMI is 43.07 kg/m.  - Discussed lifestyle modifications including regular tempered activity and decreased calorie intake.  - Nutrition and PT consulted  Stage III CKD: SCr at baseline on admission ~1.7 - Monitor in AM. - Avoid nephrotoxins, ok to continue ARB  Hypothyroidism: Chronic, stable dose. No indication for TSH testing at this time.  - Continue synthroid.   Hyperlipidemia: Chronic, stable. - Continue statin as above  Anemia of chronic disease: Hgb stable at baseline ~10-11mg /dl.  - Monitor with recent hemoptysis (has stopped)  Sinus node dysfunction s/p PPM Feb 2017: Followed by Dr. Lovena Le. - Telemetry   IBS-C:  - Continue bentyl, PPI, miralax.   Depression/anxiety/insomnia:  - Continue home medications including buspar, low dose benzodiazepine, nortriptyline  Vance Gather, MD [facility] Pager [phone] 10/11/2016, 6:06 PM

## 2016-10-12 ENCOUNTER — Ambulatory Visit: Payer: Medicare HMO | Admitting: Cardiology

## 2016-10-12 ENCOUNTER — Observation Stay (HOSPITAL_BASED_OUTPATIENT_CLINIC_OR_DEPARTMENT_OTHER): Payer: Medicare HMO

## 2016-10-12 DIAGNOSIS — Z86718 Personal history of other venous thrombosis and embolism: Secondary | ICD-10-CM | POA: Diagnosis not present

## 2016-10-12 DIAGNOSIS — E1121 Type 2 diabetes mellitus with diabetic nephropathy: Secondary | ICD-10-CM | POA: Diagnosis not present

## 2016-10-12 DIAGNOSIS — I10 Essential (primary) hypertension: Secondary | ICD-10-CM | POA: Diagnosis not present

## 2016-10-12 DIAGNOSIS — Z950 Presence of cardiac pacemaker: Secondary | ICD-10-CM | POA: Diagnosis not present

## 2016-10-12 DIAGNOSIS — R079 Chest pain, unspecified: Secondary | ICD-10-CM | POA: Diagnosis not present

## 2016-10-12 DIAGNOSIS — I5032 Chronic diastolic (congestive) heart failure: Secondary | ICD-10-CM | POA: Diagnosis not present

## 2016-10-12 DIAGNOSIS — R072 Precordial pain: Secondary | ICD-10-CM | POA: Diagnosis not present

## 2016-10-12 DIAGNOSIS — D649 Anemia, unspecified: Secondary | ICD-10-CM | POA: Diagnosis not present

## 2016-10-12 DIAGNOSIS — G4733 Obstructive sleep apnea (adult) (pediatric): Secondary | ICD-10-CM | POA: Diagnosis not present

## 2016-10-12 LAB — ECHOCARDIOGRAM COMPLETE
Ao-asc: 29 cm
FS: 35 % (ref 28–44)
Height: 67.5 in
IVS/LV PW RATIO, ED: 0.96
LA ID, A-P, ES: 41 mm
LA diam end sys: 41 mm
LA diam index: 1.63 cm/m2
LV PW d: 13.5 mm — AB (ref 0.6–1.1)
Weight: 4427.2 oz

## 2016-10-12 LAB — GLUCOSE, CAPILLARY
Glucose-Capillary: 177 mg/dL — ABNORMAL HIGH (ref 65–99)
Glucose-Capillary: 155 mg/dL — ABNORMAL HIGH (ref 65–99)
Glucose-Capillary: 148 mg/dL — ABNORMAL HIGH (ref 65–99)
Glucose-Capillary: 270 mg/dL — ABNORMAL HIGH (ref 65–99)
Glucose-Capillary: 236 mg/dL — ABNORMAL HIGH (ref 65–99)
Glucose-Capillary: 199 mg/dL — ABNORMAL HIGH (ref 65–99)

## 2016-10-12 LAB — BASIC METABOLIC PANEL
Anion gap: 9 (ref 5–15)
BUN: 19 mg/dL (ref 6–20)
CO2: 27 mmol/L (ref 22–32)
Calcium: 8.6 mg/dL — ABNORMAL LOW (ref 8.9–10.3)
Chloride: 102 mmol/L (ref 101–111)
Creatinine, Ser: 1.64 mg/dL — ABNORMAL HIGH (ref 0.44–1.00)
GFR calc Af Amer: 33 mL/min — ABNORMAL LOW (ref >60)
GFR calc non Af Amer: 29 mL/min — ABNORMAL LOW (ref >60)
Glucose, Bld: 160 mg/dL — ABNORMAL HIGH (ref 65–99)
Potassium: 3.5 mmol/L (ref 3.5–5.1)
Sodium: 138 mmol/L (ref 135–145)

## 2016-10-12 LAB — HEMOGLOBIN A1C
Hgb A1c MFr Bld: 7.6 % — ABNORMAL HIGH (ref 4.8–5.6)
Mean Plasma Glucose: 171.42 mg/dL

## 2016-10-12 NOTE — Progress Notes (Signed)
PROGRESS NOTE  Tara Jackson  ZGY:[id number] DOB: 01-Oct-1940 DOA: 10/10/2016 PCP: Shon Baton, MD  Brief Narrative: Tara Jackson is a 76 y.o. 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 This remained with intermittent worsening, initially attributed to URI with cough, though when the cough caused some blood-tinged sputum and she became more short of breath, she worried for PE and came to the ED. She was not hypoxic, but concern for PE was such that V/Q scan was performed (due to iodine allergy) which showed no evidence of mismatch/PE. She was placed in observation earlier this morning for ACS rule out. Troponins were negative and ECG showed no evidence of acute ischemia. Cardiology was consulted, echocardiogram is pending, and she will undergo stress testing.   Assessment & Plan: Principal Problem:   Chest pain Active Problems:   Essential hypertension   Sleep apnea   PULMONARY EMBOLISM, HX OF   Type 2 diabetes mellitus with renal manifestations (HCC)   Morbid obesity (HCC)   Chronic diastolic heart failure (HCC)   Pacemaker   Chronic anemia  Chest pain: Nonobstructive findings on LHC in 2014 and low risk stress testing 2016. Troponins negative thus far and ECG nonischemic. Noncardiac explanations include GI with epigastric location of pain and recent URI with persistent cough. - Cardiology consulted, recommending echo, pending, and stress testing for ACS rule out today. Given wt >250lbs, ?2 day exam.  - Continuing plavix, statin  History of recurrent DVT and PE: No longer on coumadin due to hepatic impairment, she's been compliant with plavix. V/Q scan showed no evidence of PE, and there are no symptoms consistent with DVT.  - Continue plavix, and prophylactic dose of lovenox while hospitalized.   Essential HTN:  - Continue  ARB  Chronic HFpEF: Last echo 2016 EF 55-60%, G1DD. Appears euvolemic. - Continue lasix 40mg  QOD - Monitor I/O, weights - Update echo as above  IDT2DM: HbA1c 7.6% - Hold home glipizide, will give SSI q4h and long acting insulin. At inpatient goal.  Morbid obesity: BMI is 43.07 kg/m.  - Counseled on therapeutic lifestyle modifications.  - Nutrition and PT consulted  Report of falls at home:  - PT eval  Stage III CKD: SCr at baseline on admission ~1.6-1.7 - Continue to monitor - Avoid nephrotoxins, ok to continue ARB  Hypothyroidism: Chronic, stable dose. No indication for TSH testing at this time.  - Continue synthroid.   Hyperlipidemia: Chronic, stable. - Continue statin as above  Anemia of chronic disease: Hgb stable at baseline ~10-11mg /dl.  - Monitor with recent hemoptysis (has stopped)  Sinus node dysfunction s/p PPM Feb 2017: Followed by Dr. Lovena Le. - Telemetry   IBS-C: Chronic, stable. - Continue bentyl, PPI, miralax.   Depression/anxiety/insomnia:  - Continue home medications including buspar, low dose benzodiazepine, nortriptyline  DVT prophylaxis: Lovenox Code Status: Full Family Communication: None at bedside Disposition Plan: [place] home pending cardiac work up. Consultants: Cardiology, Dr. Tamala Julian Procedures: Echocardiogram, stress testing 9/5  Subjective: Didn't sleep well. Chest discomfort still there, worse with hacking cough that is improving. No fevers.   Objective: BP 128/82 (BP Location: Right Arm)   Pulse 65   Temp 98.1 F (36.7 C) (Oral)   Resp (!) 22   Ht 5' 7.5" (1.715 m)   Wt 125.5 kg (276 lb 11.2 oz)   SpO2 93%  BMI 42.70 kg/m   Gen: Pleasant, obese female in no distress Pulm: Non-labored breathing room air. Clear to auscultation bilaterally.  CV: Regular rate and rhythm. No murmur, rub, or gallop. Difficult to appreciate JVD, no pitting pedal edema. GI: Abdomen soft, obese, non-tender, non-distended, with  normoactive bowel sounds. No organomegaly or masses felt. Ext: Warm, no deformities Skin: No rashes, lesions no ulcers Neuro: Alert and oriented. No focal neurological deficits. Psych: Judgement and insight appear normal. Mood & affect appropriate.   CBC:  Recent Labs Lab 10/10/16 2003 10/11/16 0347  WBC 7.0 6.7  HGB 10.7* 10.2*  HCT 33.6* 31.9*  MCV 95.2 94.7  PLT 270 465   Basic Metabolic Panel:  Recent Labs Lab 10/10/16 2003 10/11/16 0347 10/12/16 0332  NA 135  --  138  K 3.3*  --  3.5  CL 98*  --  102  CO2 27  --  27  GLUCOSE 97  --  160*  BUN 20  --  19  CREATININE 1.76* 1.79* 1.64*  CALCIUM 9.0  --  8.6*   GFR: Estimated Creatinine Clearance: 39.2 mL/min (A) (by C-G formula based on SCr of 1.64 mg/dL (H)).  Cardiac Enzymes:  Recent Labs Lab 10/11/16 0347 10/11/16 1051 10/11/16 1610  TROPONINI <0.03 <0.03 <0.03   HbA1C:  Recent Labs  10/12/16 0332  HGBA1C 7.6*   Radiology Studies: Dg Chest 2 View  Result Date: 10/10/2016 CLINICAL DATA:  Chest pain and shortness of breath for 1 day. EXAM: CHEST  2 VIEW COMPARISON:  Radiograph 04/21/2016 FINDINGS: Left-sided pacemaker remains in place. The cardiomediastinal contours are unchanged. The lungs are clear. Pulmonary vasculature is normal. No consolidation, pleural effusion, or pneumothorax. No acute osseous abnormalities are seen. IMPRESSION: No acute abnormality. Electronically Signed   By: Jeb Levering M.D.   On: 10/10/2016 21:17   Nm Pulmonary Vent And Perf (v/q Scan)  Result Date: 10/11/2016 CLINICAL DATA:  Shortness of Breath EXAM: NUCLEAR MEDICINE VENTILATION - PERFUSION LUNG SCAN TECHNIQUE: Ventilation images were obtained in multiple projections using inhaled aerosol Tc-31m DTPA. Perfusion images were obtained in multiple projections after intravenous injection of Tc-67m MAA. RADIOPHARMACEUTICALS:  31.7 mCi Technetium-16m DTPA aerosol inhalation and 4.3 mCi Technetium-18m MAA IV COMPARISON:  Chest  x-ray 10/10/2016 FINDINGS: Ventilation: No focal ventilation defect. Perfusion: No wedge shaped peripheral perfusion defects to suggest acute pulmonary embolism. IMPRESSION: No evidence of pulmonary embolus. Electronically Signed   By: Rolm Baptise M.D.   On: 10/11/2016 10:48   Time spent: 25 minutes.  Vance Gather, MD [facility] Pager [phone]  If 7PM-7AM, please contact night-coverage [email or web address] Password [email or web address] 10/12/2016, 8:30 AM

## 2016-10-12 NOTE — Progress Notes (Signed)
  Echocardiogram 2D Echocardiogram has been performed.  Tisa Weisel G Herberth Deharo 10/12/2016, 12:16 PM

## 2016-10-12 NOTE — Progress Notes (Signed)
Inpatient Diabetes Program Recommendations  AACE/ADA: New Consensus Statement on Inpatient Glycemic Control (2015)  Target Ranges:  Prepandial:   less than 140 mg/dL      Peak postprandial:   less than 180 mg/dL (1-2 hours)      Critically ill patients:  140 - 180 mg/dL   Lab Results  Component Value Date   GLUCAP 270 (H) 10/12/2016   HGBA1C 7.6 (H) 10/12/2016    Review of Glycemic Control  Diabetes history: DM 2 Outpatient Diabetes medications: NPH 25 units tid, Novolog Regular 25 units tid, Glipizide 20 mg BID Current orders for Inpatient glycemic control: NPH 10 units BID, Novolog Sensitive Correction 0-9 units Q4 hours  Hyperglycemia at lunchtime, 270 mg/dl, due to patient being NPO for test. NPH given watch trends for now. No adjustments recommended at this time.  Thanks,  Tama Headings RN, MSN, [facility] Inpatient Diabetes Coordinator Team Pager [phone] (8a-5p)

## 2016-10-12 NOTE — Progress Notes (Signed)
  RD consulted for nutrition education regarding weight loss.  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 She said she is obese because she is not very active.  Body mass index is 42.7 kg/m. Pt meets criteria for class 3, extreme/morbid obesity based on current BMI.  RD provided "Weight Loss Tips" handout and 5 days of 1200 calorie per day meal plans from the Academy of [employer] Dietetics.   Expect good compliance.  Current diet order is heart healthy, CHO modified, patient is consuming approximately 100% of meals at this time. Labs and medications reviewed. No further nutrition interventions warranted at this time. RD contact information provided. If additional nutrition issues arise, please re-consult RD.  Molli Barrows, RD, LDN, [place] Pager [phone] After Hours Pager [phone]

## 2016-10-12 NOTE — Evaluation (Addendum)
Physical Therapy Evaluation Patient Details Name: Tara Jackson MRN: [id number] DOB: 1940-09-29 Today's Date: 10/12/2016   History of Present Illness  Patient is a 76 y/o female who presents with chest pain and SOB. Cardiac markers and EKG not showing anything acute; CXR- unremarkable. PMH includes PE, stroke, HTN, HLD, DM, glaucoma, DVT, diastolic CHF, sinus node dysfunction s/p pacemaker placement  Clinical Impression  Patient presents with generalized weakness, dyspnea on exertion, impaired balance and impaired mobility s/p above. Tolerated short distance ambulation with Min guard assist for safety. Limited by 3/4 DOE. Pt uses hover round w/c at home for household ambulation as well as minimal walking with RW per request of HHPT a few years ago. Reports falls due to imbalance. Pt has support of spouse. Will follow acutely to maximize independence and mobility prior to return home.     Follow Up Recommendations Home health PT;Supervision for mobility/OOB    Equipment Recommendations  None recommended by PT    Recommendations for Other Services OT consult     Precautions / Restrictions Precautions Precautions: Fall Restrictions Weight Bearing Restrictions: No      Mobility  Bed Mobility Overal bed mobility: Needs Assistance Bed Mobility: Supine to Sit;Sit to Supine     Supine to sit: Supervision;HOB elevated Sit to supine: Supervision;HOB elevated   General bed mobility comments: No assist needed, use of rail and increased time.   Transfers Overall transfer level: Needs assistance Equipment used: Rolling walker (2 wheeled) Transfers: Sit to/from Stand Sit to Stand: Mod assist         General transfer comment: Assist to power to standing with cues for hand placement and use of body momentum. Stood from [employer], from low toilet x1 using bar.   Ambulation/Gait Ambulation/Gait assistance: Min guard Ambulation Distance (Feet): 16 Feet (x2 bouts) Assistive device:  Rolling walker (2 wheeled) Gait Pattern/deviations: Step-through pattern;Decreased stride length;Trunk flexed;Wide base of support Gait velocity: decreased   General Gait Details: Slow, mildly unsteady gait. 3/4 DOE. VSS. 1 seated rest break.   Stairs            Wheelchair Mobility    Modified Rankin (Stroke Patients Only)       Balance Overall balance assessment: Needs assistance;History of Falls Sitting-balance support: Feet supported;No upper extremity supported Sitting balance-Leahy Scale: Fair Sitting balance - Comments: Able to perform pericare without difficulty.   Standing balance support: During functional activity;Single extremity supported Standing balance-Leahy Scale: Fair Standing balance comment: Requires at least 1 UE support in standing, furniture walks when not using RW, does better with BUE support.                              Pertinent Vitals/Pain Pain Assessment: No/denies pain    Home Living Family/patient expects to be discharged to:: Private residence Living Arrangements: Spouse/significant other Available Help at Discharge: Family;Available 24 hours/day Type of Home: House Home Access: Level entry     Home Layout: One level Home Equipment: Walker - 2 wheels;Bedside commode;Shower seat;Wheelchair - Education officer, community - power Additional Comments: Uses Hoverround at home    Prior Function Level of Independence: Needs assistance   Gait / Transfers Assistance Needed: Uses hoverround mostly; Furniture walking at home.  ADL's / Homemaking Assistance Needed: Assist with ADLs. Aide comes in twice/month. Spouse does IADLs.  Comments: Reports a few falls.      Hand Dominance   Dominant Hand: Right    Extremity/Trunk Assessment  Upper Extremity Assessment Upper Extremity Assessment: Defer to OT evaluation RUE Deficits / Details: Difficulty with fine motor tasks RUE due to previous stroke.    Lower Extremity Assessment Lower  Extremity Assessment: Generalized weakness       Communication   Communication: No difficulties  Cognition Arousal/Alertness: Awake/alert Behavior During Therapy: WFL for tasks assessed/performed Overall Cognitive Status: Within Functional Limits for tasks assessed                                        General Comments      Exercises     Assessment/Plan    PT Assessment Patient needs continued PT services  PT Problem List Decreased strength;Decreased mobility;Cardiopulmonary status limiting activity;Decreased balance;Decreased activity tolerance       PT Treatment Interventions Therapeutic activities;Gait training;Therapeutic exercise;Patient/family education;Balance training;Functional mobility training    PT Goals (Current goals can be found in the Care Plan section)  Acute Rehab PT Goals Patient Stated Goal: to go home PT Goal Formulation: With patient Time For Goal Achievement: 10/26/16 Potential to Achieve Goals: Good    Frequency Min 3X/week   Barriers to discharge        Co-evaluation               AM-PAC PT "6 Clicks" Daily Activity  Outcome Measure Difficulty turning over in bed (including adjusting bedclothes, sheets and blankets)?: None Difficulty moving from lying on back to sitting on the side of the bed? : None Difficulty sitting down on and standing up from a chair with arms (e.g., wheelchair, bedside commode, etc,.)?: Unable Help needed moving to and from a bed to chair (including a wheelchair)?: A Little Help needed walking in hospital room?: A Little Help needed climbing 3-5 steps with a railing? : A Lot 6 Click Score: 17    End of Session Equipment Utilized During Treatment: Gait belt Activity Tolerance: Patient limited by fatigue Patient left: in bed;with call bell/phone within reach;with bed alarm set Nurse Communication: Mobility status PT Visit Diagnosis: Unsteadiness on feet (R26.81);Muscle weakness (generalized)  (M62.81);Other (comment) (DOE)    Time: 3817-7116 PT Time Calculation (min) (ACUTE ONLY): 25 min   Charges:   PT Evaluation $PT Eval Moderate Complexity: 1 Mod PT Treatments $Therapeutic Activity: 8-22 mins   PT G Codes:   PT G-Codes **NOT FOR INPATIENT CLASS** Functional Assessment Tool Used: Clinical judgement Functional Limitation: Mobility: Walking and moving around Mobility: Walking and Moving Around Current Status (F7903): At least 40 percent but less than 60 percent impaired, limited or restricted Mobility: Walking and Moving Around Goal Status [phone]): At least 20 percent but less than 40 percent impaired, limited or restricted    [place], [place], [place] 10/12/2016, 2:02 PM

## 2016-10-13 ENCOUNTER — Observation Stay (HOSPITAL_BASED_OUTPATIENT_CLINIC_OR_DEPARTMENT_OTHER): Payer: Medicare HMO

## 2016-10-13 DIAGNOSIS — R079 Chest pain, unspecified: Secondary | ICD-10-CM | POA: Diagnosis not present

## 2016-10-13 DIAGNOSIS — Z950 Presence of cardiac pacemaker: Secondary | ICD-10-CM | POA: Diagnosis not present

## 2016-10-13 DIAGNOSIS — D649 Anemia, unspecified: Secondary | ICD-10-CM | POA: Diagnosis not present

## 2016-10-13 DIAGNOSIS — Z86718 Personal history of other venous thrombosis and embolism: Secondary | ICD-10-CM | POA: Diagnosis not present

## 2016-10-13 DIAGNOSIS — G4733 Obstructive sleep apnea (adult) (pediatric): Secondary | ICD-10-CM | POA: Diagnosis not present

## 2016-10-13 DIAGNOSIS — E1121 Type 2 diabetes mellitus with diabetic nephropathy: Secondary | ICD-10-CM | POA: Diagnosis not present

## 2016-10-13 DIAGNOSIS — I5032 Chronic diastolic (congestive) heart failure: Secondary | ICD-10-CM

## 2016-10-13 DIAGNOSIS — I10 Essential (primary) hypertension: Secondary | ICD-10-CM | POA: Diagnosis not present

## 2016-10-13 LAB — GLUCOSE, CAPILLARY
Glucose-Capillary: 200 mg/dL — ABNORMAL HIGH (ref 65–99)
Glucose-Capillary: 192 mg/dL — ABNORMAL HIGH (ref 65–99)
Glucose-Capillary: 213 mg/dL — ABNORMAL HIGH (ref 65–99)
Glucose-Capillary: 196 mg/dL — ABNORMAL HIGH (ref 65–99)
Glucose-Capillary: 241 mg/dL — ABNORMAL HIGH (ref 65–99)
Glucose-Capillary: 270 mg/dL — ABNORMAL HIGH (ref 65–99)
Glucose-Capillary: 315 mg/dL — ABNORMAL HIGH (ref 65–99)

## 2016-10-13 MED ORDER — REGADENOSON 0.4 MG/5ML IV SOLN
0.4000 mg | Freq: Once | INTRAVENOUS | Status: AC
  Administered 2016-10-13: 0.4 mg via INTRAVENOUS
  Filled 2016-10-13: qty 5

## 2016-10-13 MED ORDER — LOPERAMIDE HCL 2 MG PO CAPS
2.0000 mg | ORAL_CAPSULE | ORAL | Status: DC | PRN
  Administered 2016-10-13: 2 mg via ORAL
  Filled 2016-10-13: qty 1

## 2016-10-13 MED ORDER — ACETAMINOPHEN 325 MG PO TABS
ORAL_TABLET | ORAL | Status: AC
  Filled 2016-10-13: qty 2

## 2016-10-13 MED ORDER — REGADENOSON 0.4 MG/5ML IV SOLN
INTRAVENOUS | Status: AC
  Filled 2016-10-13: qty 5

## 2016-10-13 NOTE — Care Management Note (Signed)
Case Management Note  Patient Details  Name: Tara Jackson MRN: [id number] Date of Birth: [birth year]  Subjective/Objective: Pt presented for Chest Pain-Seen in [facility] for stress portion day 1 of 2 studies. Pt is from home with support of husband that is at the bedside at the time of conversation. Plan will be to return home once stable. Pt has DME RW, WC and a Hover-round. PT/OT recommendations for HHPT.                     Action/Plan: Pt is agreeable to [facility] and pt states she has used [facility] in the past and wants to use them again. CM did make referral for [facility], PT, [place] with [place]. SOC to begin within  24-48 hours post d/c. Pt will need orders for Services listed above and F2F. Pt should not have a co pay for services. Pt was concerned with cost of [place] since she was placed in Observation Status and knows that she may have an additional bill with medication cost while hospitalized. No further needs from CM at this time.    Expected Discharge Date:                  Expected Discharge Plan:  [place]  In-House Referral:  NA  Discharge planning Services  CM Consult  Post Acute Care Choice:  Home Health Choice offered to:  Patient, Spouse  DME Arranged:  N/A DME Agency:  NA  HH Arranged:  RN, PT, Nurse's Aide [place] Agency:  [place]  Status of Service:  Completed, signed off  If discussed at [place] of Stay Meetings, dates discussed:    Additional Comments:  Bethena Roys, RN 10/13/2016, 11:18 AM

## 2016-10-13 NOTE — Progress Notes (Signed)
PROGRESS NOTE  Tara Jackson  QPR:[id number] DOB: [birth year] DOA: 10/10/2016 PCP: Shon Baton, MD  Brief Narrative: Tara Jackson is a 76 y.o. 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 This remained with intermittent worsening, initially attributed to URI with cough, though when the cough caused some blood-tinged sputum and she became more short of breath, she worried for PE and came to the ED. She was not hypoxic, but concern for PE was such that V/Q scan was performed (due to iodine allergy) which showed no evidence of mismatch/PE. She was placed in observation earlier this morning for ACS rule out. Troponins were negative and ECG showed no evidence of acute ischemia. Cardiology was consulted, echocardiogram is pending, and she will undergo stress testing.   Assessment & Plan: Principal Problem:   Chest pain Active Problems:   Essential hypertension   Sleep apnea   PULMONARY EMBOLISM, HX OF   Type 2 diabetes mellitus with renal manifestations (HCC)   Morbid obesity (HCC)   Chronic diastolic heart failure (HCC)   Pacemaker   Chronic anemia  Chest pain: Nonobstructive findings on LHC in 2014 and low risk stress testing 2016. Troponins negative thus far and ECG nonischemic. Noncardiac explanations include GI with epigastric location of pain and recent URI with persistent cough. - Cardiology consulted, stress testing 9/6 - 9/7. Plan DC 9/7 if no further evaluation is needed.  - Continuing plavix, statin  History of recurrent DVT and PE: No longer on coumadin due to hepatic impairment, she's been compliant with plavix. V/Q scan showed no evidence of PE, and there are no symptoms consistent with DVT.  - Continue plavix, and prophylactic dose of lovenox while hospitalized.   Essential HTN:  - Continue ARB  Chronic HFpEF: Last echo 2016  EF 55-60%, G1DD. Echo 10/12/16 showed EF 65-70%, no remark on diastolic function due to limited study. Appears euvolemic. - Continue lasix 40mg  QOD - Monitor I/O, weights  IDT2DM: HbA1c 7.6% - Hold home glipizide, will give SSI q4h and long acting insulin. At inpatient goal.  Morbid obesity: BMI is 43.07 kg/m.  - Counseled on therapeutic lifestyle modifications.  - Nutrition and PT consulted  Report of falls at home:  - PT eval  Stage III CKD: SCr at baseline on admission ~1.6-1.7 - Continue to monitor - Avoid nephrotoxins, ok to continue ARB  Hypothyroidism: Chronic, stable dose. No indication for TSH testing at this time.  - Continue synthroid.   Hyperlipidemia: Chronic, stable. - Continue statin as above  Anemia of chronic disease: Hgb stable at baseline ~10-11mg /dl.  - Monitor with recent hemoptysis (has stopped)  Sinus node dysfunction s/p PPM Feb 2017: Followed by Dr. Lovena Le. - Telemetry   IBS-C: Chronic, stable. - Continue bentyl, PPI, miralax.   Depression/anxiety/insomnia:  - Continue home medications including buspar, low dose benzodiazepine, nortriptyline  DVT prophylaxis: Lovenox Code Status: Full Family Communication: None at bedside Disposition Plan: [place] home pending cardiac work up. Consultants: Cardiology, Dr. Tamala Julian  Procedures:  Echocardiogram 9/5 Study Conclusions - Left ventricle: The cavity size was normal. Wall thickness was   increased in a pattern of mild LVH. Systolic function was   vigorous. The estimated ejection fraction was in the range of 65%   to 70%. Wall motion was normal; there were no regional wall   motion abnormalities.  Impressions: - Extremely  limited; definity used; essentially no apical or   subcostal views other than definity images; vigorous LV systolic   function; trace TR; cannot R/O SOE with this study  Subjective: Lower mid chest discomfort remains, slightly improved. Feels like she has a cold,  runny nose. No fever.  Objective: BP (!) 146/52 (BP Location: Right Arm)   Pulse 72   Temp 98.2 F (36.8 C) (Oral)   Resp 17   Ht 5' 7.5" (1.715 m)   Wt 126 kg (277 lb 12.8 oz)   SpO2 97%   BMI 42.87 kg/m   Gen: Pleasant, obese female in no distress Pulm: Non-labored breathing room air. Clear to auscultation bilaterally.  CV: Regular rate and rhythm. No murmur, rub, or gallop. Difficult to appreciate JVD, no pitting pedal edema. GI: Abdomen soft, obese, non-tender, non-distended, with normoactive bowel sounds. No organomegaly or masses felt. Ext: Warm, no deformities Skin: No rashes, lesions no ulcers Neuro: Alert and oriented. No focal neurological deficits. Psych: Judgement and insight appear normal. Mood & affect appropriate.   CBC:  Recent Labs Lab 10/10/16 2003 10/11/16 0347  WBC 7.0 6.7  HGB 10.7* 10.2*  HCT 33.6* 31.9*  MCV 95.2 94.7  PLT 270 081   Basic Metabolic Panel:  Recent Labs Lab 10/10/16 2003 10/11/16 0347 10/12/16 0332  NA 135  --  138  K 3.3*  --  3.5  CL 98*  --  102  CO2 27  --  27  GLUCOSE 97  --  160*  BUN 20  --  19  CREATININE 1.76* 1.79* 1.64*  CALCIUM 9.0  --  8.6*   GFR: Estimated Creatinine Clearance: 39.3 mL/min (A) (by C-G formula based on SCr of 1.64 mg/dL (H)).  Cardiac Enzymes:  Recent Labs Lab 10/11/16 0347 10/11/16 1051 10/11/16 1610  TROPONINI <0.03 <0.03 <0.03   HbA1C:  Recent Labs  10/12/16 0332  HGBA1C 7.6*   Time spent: 25 minutes.  Vance Gather, MD [facility] Pager [phone]  If 7PM-7AM, please contact night-coverage [email or web address] Password [email or web address] 10/13/2016, 3:33 PM

## 2016-10-13 NOTE — Progress Notes (Signed)
Patient presented for Lexiscan ( day 1 of 2 days study - stress part done today) Tolerated procedure well. Pending final stress imaging result.

## 2016-10-13 NOTE — Care Management Obs Status (Signed)
[place] NOTIFICATION   Patient Details  Name: Eulamae Greenstein MRN: [id number] Date of Birth: 1940-08-14   Medicare Observation Status Notification Given:  Yes (pt not willing to sign- per pt she was told she was being admitted. )    Bethena Roys, RN 10/13/2016, 11:15 AM

## 2016-10-13 NOTE — Plan of Care (Signed)
Problem: Pain Managment: Goal: General experience of comfort will improve Outcome: Completed/Met Date Met: 10/13/16 Pt is now CP free

## 2016-10-13 NOTE — Progress Notes (Signed)
Progress Note  Patient Name: Tara Jackson Date of Encounter: 10/13/2016  Primary Cardiologist: Dr. Ellyn Hack Primary Electrophysiologist:  Dr. Lovena Le  Subjective   Seen in nuc med for stress portion ( day 1 of 2 days study).   Inpatient Medications    Scheduled Meds: . busPIRone  10 mg Oral q morning - 10a  . clonazePAM  1 mg Oral Daily   And  . clonazePAM  0.5 mg Oral QHS  . clopidogrel  75 mg Oral q morning - 10a  . darifenacin  7.5 mg Oral Daily  . dicyclomine  10 mg Oral BID  . enoxaparin (LOVENOX) injection  40 mg Subcutaneous Daily  . furosemide  40 mg Oral QODAY  . insulin aspart  0-9 Units Subcutaneous Q4H  . insulin NPH Human  10 Units Subcutaneous BID AC & HS  . irbesartan  150 mg Oral Daily  . levothyroxine  100 mcg Oral QAC breakfast  . magnesium oxide  400 mg Oral Daily  . nortriptyline  100 mg Oral QHS  . pantoprazole  40 mg Oral Daily  . polyethylene glycol  17 g Oral QODAY  . potassium chloride SA  10 mEq Oral BID  . regadenoson       Continuous Infusions:  PRN Meds: acetaminophen, albuterol, LORazepam, morphine injection, ondansetron (ZOFRAN) IV, traMADol   Vital Signs    Vitals:   10/13/16 0848 10/13/16 0926 10/13/16 0927 10/13/16 0930  BP: (!) 136/55 (!) 141/70 (!) 124/50 (!) 127/51  Pulse:      Resp:      Temp:      TempSrc:      SpO2:      Weight:      Height:        Intake/Output Summary (Last 24 hours) at 10/13/16 0936 Last data filed at 10/12/16 2140  Gross per 24 hour  Intake              530 ml  Output                2 ml  Net              528 ml   Filed Weights   10/12/16 0452 10/13/16 0355 10/13/16 0728  Weight: 276 lb 11.2 oz (125.5 kg) 277 lb 12.8 oz (126 kg) 277 lb 12.8 oz (126 kg)    Telemetry    Un able to review as patient seen in nuc med ECG    N/A  Physical Exam   GEN: Obese female in no acute distress.   Neck: No JVD Cardiac: RRR, no murmurs, rubs, or gallops.  Respiratory: Clear to auscultation  bilaterally. GI: Soft, nontender, non-distended  MS: No edema; No deformity. Neuro:  Nonfocal  Psych: Normal affect   Labs    Chemistry Recent Labs Lab 10/10/16 2003 10/11/16 0347 10/12/16 0332  NA 135  --  138  K 3.3*  --  3.5  CL 98*  --  102  CO2 27  --  27  GLUCOSE 97  --  160*  BUN 20  --  19  CREATININE 1.76* 1.79* 1.64*  CALCIUM 9.0  --  8.6*  GFRNONAA 27* 26* 29*  GFRAA 31* 30* 33*  ANIONGAP 10  --  9     Hematology Recent Labs Lab 10/10/16 2003 10/11/16 0347  WBC 7.0 6.7  RBC 3.53* 3.37*  HGB 10.7* 10.2*  HCT 33.6* 31.9*  MCV 95.2 94.7  MCH 30.3 30.3  MCHC 31.8 32.0  RDW 12.9 12.9  PLT 270 264    Cardiac Enzymes Recent Labs Lab 10/11/16 0347 10/11/16 1051 10/11/16 1610  TROPONINI <0.03 <0.03 <0.03    Recent Labs Lab 10/10/16 2015 10/11/16 0056  TROPIPOC 0.02 0.01     BNPNo results for input(s): BNP, PROBNP in the last 168 hours.   DDimer No results for input(s): DDIMER in the last 168 hours.   Radiology    Nm Pulmonary Vent And Perf (v/q Scan)  Result Date: 10/11/2016 CLINICAL DATA:  Shortness of Breath EXAM: NUCLEAR MEDICINE VENTILATION - PERFUSION LUNG SCAN TECHNIQUE: Ventilation images were obtained in multiple projections using inhaled aerosol Tc-53m DTPA. Perfusion images were obtained in multiple projections after intravenous injection of Tc-39m MAA. RADIOPHARMACEUTICALS:  31.7 mCi Technetium-37m DTPA aerosol inhalation and 4.3 mCi Technetium-39m MAA IV COMPARISON:  Chest x-ray 10/10/2016 FINDINGS: Ventilation: No focal ventilation defect. Perfusion: No wedge shaped peripheral perfusion defects to suggest acute pulmonary embolism. IMPRESSION: No evidence of pulmonary embolus. Electronically Signed   By: Rolm Baptise M.D.   On: 10/11/2016 10:48    Cardiac Studies   Pending stress test  Echo 10/12/16 Study Conclusions  - Left ventricle: The cavity size was normal. Wall thickness was   increased in a pattern of mild LVH. Systolic  function was   vigorous. The estimated ejection fraction was in the range of 65%   to 70%. Wall motion was normal; there were no regional wall   motion abnormalities.  Impressions:  - Extremely limited; definity used; essentially no apical or   subcostal views other than definity images; vigorous LV systolic   function; trace TR; cannot R/O SOE with this study.  Patient Profile     Tara Jackson is a 76 y.o. female with a hx of PE/DVT, sinus node dysfunction (S/P PPM 03/2015), hypertension, hyperlipidemia, and normal coronary arteries by cath in 2014 admitted with chest pain.   Assessment & Plan    1. Chest pain - Troponin negative. VQ scan negative for PE. Likely atypical chest pain. Stress portion of 2 days study done today.   2. Hypertensive heart disease - Echo yesterday showed LVEF of 65-70%, no wm abnormality. definity used. Can not r/o SOE on study.  Signed, Leanor Kail, PA  10/13/2016, 9:36 AM    The patient has been seen in conjunction with Vin Bhagat, PA-C. All aspects of care have been considered and discussed. The patient has been personally interviewed, examined, and all clinical data has been reviewed.   Chest is still uncomfortable. Very atypical. No evolution on EKG or abnormality in biomarkers.  First part of nuclear completed today. Second part will be done tomorrow.  Plan no further cardiac workup and results of nuclear will not change opinion unless there are high risk abnormalities noted.

## 2016-10-14 ENCOUNTER — Observation Stay (HOSPITAL_COMMUNITY): Payer: Medicare HMO

## 2016-10-14 DIAGNOSIS — I10 Essential (primary) hypertension: Secondary | ICD-10-CM | POA: Diagnosis not present

## 2016-10-14 DIAGNOSIS — I5032 Chronic diastolic (congestive) heart failure: Secondary | ICD-10-CM | POA: Diagnosis not present

## 2016-10-14 DIAGNOSIS — E1121 Type 2 diabetes mellitus with diabetic nephropathy: Secondary | ICD-10-CM | POA: Diagnosis not present

## 2016-10-14 DIAGNOSIS — R079 Chest pain, unspecified: Secondary | ICD-10-CM | POA: Diagnosis not present

## 2016-10-14 DIAGNOSIS — G4733 Obstructive sleep apnea (adult) (pediatric): Secondary | ICD-10-CM | POA: Diagnosis not present

## 2016-10-14 DIAGNOSIS — Z950 Presence of cardiac pacemaker: Secondary | ICD-10-CM | POA: Diagnosis not present

## 2016-10-14 DIAGNOSIS — D649 Anemia, unspecified: Secondary | ICD-10-CM | POA: Diagnosis not present

## 2016-10-14 DIAGNOSIS — Z86718 Personal history of other venous thrombosis and embolism: Secondary | ICD-10-CM | POA: Diagnosis not present

## 2016-10-14 LAB — NM MYOCAR MULTI W/SPECT W/WALL MOTION / EF
Estimated workload: 1 METS
Exercise duration (min): 5 min
Exercise duration (sec): 17 s
Peak HR: 73 {beats}/min
Rest HR: 60 {beats}/min

## 2016-10-14 LAB — GLUCOSE, CAPILLARY
Glucose-Capillary: 136 mg/dL — ABNORMAL HIGH (ref 65–99)
Glucose-Capillary: 141 mg/dL — ABNORMAL HIGH (ref 65–99)
Glucose-Capillary: 149 mg/dL — ABNORMAL HIGH (ref 65–99)
Glucose-Capillary: 158 mg/dL — ABNORMAL HIGH (ref 65–99)
Glucose-Capillary: 189 mg/dL — ABNORMAL HIGH (ref 65–99)
Glucose-Capillary: 262 mg/dL — ABNORMAL HIGH (ref 65–99)

## 2016-10-14 MED ORDER — TECHNETIUM TC 99M TETROFOSMIN IV KIT: 30.0000 | PACK | Freq: Once | INTRAVENOUS | Status: DC | PRN

## 2016-10-14 MED ORDER — TECHNETIUM TC 99M TETROFOSMIN IV KIT
30.0000 | PACK | Freq: Once | INTRAVENOUS | Status: AC | PRN
  Administered 2016-10-13: 30 via INTRAVENOUS

## 2016-10-14 MED ORDER — IPRATROPIUM BROMIDE 0.03 % NA SOLN: 2.0000 | Freq: Two times a day (BID) | NASAL | 0 refills | Status: DC

## 2016-10-14 MED ORDER — TECHNETIUM TC 99M TETROFOSMIN IV KIT
30.0000 | PACK | Freq: Once | INTRAVENOUS | Status: AC | PRN
  Administered 2016-10-14: 30 via INTRAVENOUS

## 2016-10-14 NOTE — Progress Notes (Signed)
PT Cancellation Note  Patient Details Name: Tara Jackson MRN: [id number] DOB: [birth year]   Cancelled Treatment:    Reason Eval/Treat Not Completed: Patient at procedure or test/unavailable Pt off floor at stress test. Will follow up as time allows.   [place] 10/14/2016, 11:33 AM Wray Kearns, PT, DPT [phone]

## 2016-10-14 NOTE — Progress Notes (Addendum)
Cardiology  Has had continuous chest discomfort throughout hospital admission. Still present today.  I briefly viewed the nuclear study. It is certainly not high risk. There may be inferobasal focal ischemia or diaphragm attenuation. I would not recommend further cardiac workup. Official interpretation is pending. Had low-dose beta blocker, metoprolol succinate 25 mg daily. Continue antiplatelet therapy. Follow-up his OP with primary cardiologist, Dr. Ellyn Hack.  Echo does not reveal pericardial disease/effusion. Poor quality due to obesity.  Please call if may be of further assistance.

## 2016-10-14 NOTE — Progress Notes (Signed)
Pt very unhappy about medicare notification that oral meds may not be covered due to observation status. Pt refusing to take oral medication because she does not want to pay for them if insurance chooses not to cover. Dr. Bonner Puna made aware. Cont to monitor. Carroll Kinds RN

## 2016-10-14 NOTE — Discharge Summary (Signed)
Physician Discharge Summary  Tara Jackson OIN:[id number] DOB: 04-14-1940 DOA: 10/10/2016  PCP: Shon Baton, MD  Admit date: 10/10/2016 Discharge date: 10/14/2016  Admitted From: Home Disposition: Home   Recommendations for Outpatient Follow-up:  1. Follow up with PCP in 1-2 weeks 2. Follow up with GI in next 1 - 2 weeks for follow up noncardiac epigastric/chest pain.   Home Health: PT, RN, aide Equipment/Devices: None new Discharge Condition: Stable CODE STATUS: Full Diet recommendation: Heart healthy  Brief/Interim Summary: Tara Jackson a 76 y.o.femalewith a history of SSS s/p PPM Feb 2017, recurrent DVT/PE's, HTN, DM, hypothyroidism, nonobstructive CAD, and HLD who presented to the ED 9/3 with a 2 day history of midchest pressure/pain. This remained with intermittent worsening, initially attributed to URI with cough, though when the cough caused some blood-tinged sputum and she became more short of breath, she worried for PE and came to the ED. She was not hypoxic, but concern for PE was such that V/Q scan was performed (due to iodine allergy) which showed no evidence of mismatch/PE. She was placed in observation for ACS rule out. Troponins were negative and ECG showed no evidence of acute ischemia. Cardiology was consulted, echocardiogram was very limited, and she underwent 2-day stress testing which was normal, not high risk.  Discharge Diagnoses:  Principal Problem:   Chest pain Active Problems:   Essential hypertension   Sleep apnea   PULMONARY EMBOLISM, HX OF   Type 2 diabetes mellitus with renal manifestations (HCC)   Morbid obesity (HCC)   Chronic diastolic heart failure (HCC)   Pacemaker   Chronic anemia  Chest pain: Nonobstructive findings on LHC in 2014 and low risk stress testing 2016. Troponins negative thus far and ECG nonischemic. Noncardiac explanations include GI with epigastric location of pain and recent URI with persistent cough. - Cardiology consulted,  stress testing 9/6 - 9/7 was low risk.  - Primary etiology thought to be muscle strain related to cough from viral URI symptoms. Could also be GI, treating IBS as below and GERD with PPI. Also try atrovent for cold symptoms. No infiltrate/fever to suggest pneumonia as cause of cough. - Continuing plavix, statin.  History of recurrent DVT and PE: No longer on coumadin due to hepatic impairment, she's been compliant with plavix. V/Q scan showed no evidence of PE, and there are no symptoms consistent with DVT.  - Continue plavix  Essential HTN:  - Continue ARB  Chronic HFpEF: Last echo 2016 EF 55-60%, G1DD. Echo 10/12/16 showed EF 65-70%, no remark on diastolic function due to limited study. Appears euvolemic. - Continue lasix 40mg  QOD - Monitor I/O, weights  IDT2DM: HbA1c 7.6% - Hold home glipizide, will give SSI q4h and long acting insulin. At inpatient goal. Will restart home medications at discharge.  Morbid obesity: BMI is43.07 kg/m.  - Counseled on therapeutic lifestyle modifications.  - Nutrition and PT consulted  Report of falls at home:  - PT eval: Home health PT at DC  Stage III CKD: SCr at baseline on admission ~1.6-1.7 - Continue to monitor - Avoid nephrotoxins, ok to continue ARB  Hypothyroidism: Chronic, stable dose. No indication for TSH testing at this time.  - Continue synthroid.   Hyperlipidemia: Chronic, stable. - Continue statin as above  Anemia of chronic disease: Hgb stable at baseline ~10-11mg /dl.  - Monitor with recent hemoptysis (has stopped)  Sinus node dysfunction s/p PPM Feb 2017: Followed by Dr. Lovena Le. - Telemetry   IBS-C: Chronic, stable. - Continue bentyl, PPI,  miralax.   Depression/anxiety/insomnia:  - Continue home medications including buspar, low dose benzodiazepine, nortriptyline.   Discharge Instructions Discharge Instructions    Diet - low sodium heart healthy    Complete by:  As directed    Discharge instructions     Complete by:  As directed    You were evaluated for chest pain and had a low risk stress test and have no evidence of heart failure. It is possible your chest/upper abdominal discomfort is from reflux and/or the coughing with your cold. To treat this, continue taking your medications as directed, including nexium.  - Start atrovent nasal spray at twice daily, especially at night, to help with cold symptoms.  - Follow up with Dr. Watt Climes soon to further investigate the possibility of a GI cause to your pain.  - If you develop worsening symptoms, including worsened chest pain or trouble breathing, seek medical attention right away.   Increase activity slowly    Complete by:  As directed      Allergies as of 10/14/2016      Reactions   Iodine Other (See Comments)   ONLY IV--Makes unconscious   Iohexol Other (See Comments)    Code: SOB, Desc: cardiac arrest w/ iv contrast, has never used 13 hr prep//alice calhoun, Onset Date: [id number]   Metoclopramide Hcl Other (See Comments)   suicidal   Sulfa Antibiotics Hives   Lactose Intolerance (gi) Diarrhea   Lansoprazole Nausea And Vomiting      Medication List    TAKE these medications   acetaminophen 500 MG tablet Commonly known as:  TYLENOL Take 500 mg by mouth every 6 (six) hours as needed for moderate pain.   albuterol 108 (90 Base) MCG/ACT inhaler Commonly known as:  PROVENTIL HFA;VENTOLIN HFA Inhale 2 puffs into the lungs every 6 (six) hours as needed for wheezing.   Biotin 1 MG Caps Take 1 mg by mouth daily as needed (supplement).   busPIRone 10 MG tablet Commonly known as:  BUSPAR Take 1 tablet (10 mg total) by mouth every morning.   clonazePAM 0.5 MG tablet Commonly known as:  KLONOPIN Take 0.5-1 mg by mouth 2 (two) times daily. Takes 2 tabs in am and 1 tab in pm   clopidogrel 75 MG tablet Commonly known as:  PLAVIX Take 75 mg by mouth every morning.   dicyclomine 10 MG capsule Commonly known as:  BENTYL Take 10 mg by  mouth 2 (two) times daily.   esomeprazole 40 MG capsule Commonly known as:  NEXIUM Take 40 mg by mouth daily before breakfast.   EX-LAX PO Take 1 tablet by mouth daily as needed (constipation).   furosemide 40 MG tablet Commonly known as:  LASIX Take 40 mg by mouth every other day.   glipiZIDE 10 MG tablet Commonly known as:  GLUCOTROL Take 20 mg by mouth 2 (two) times daily before a meal.   insulin NPH Human 100 UNIT/ML injection Commonly known as:  HUMULIN N,NOVOLIN N Inject 25 Units into the skin 3 (three) times daily. Novolin-N   insulin regular 100 units/mL injection Commonly known as:  NOVOLIN R,HUMULIN R Inject 25 Units into the skin 3 (three) times daily.   ipratropium 0.03 % nasal spray Commonly known as:  ATROVENT Place 2 sprays into both nostrils every 12 (twelve) hours.   irbesartan 150 MG tablet Commonly known as:  AVAPRO Take 150 mg by mouth daily.   levothyroxine 100 MCG tablet Commonly known as:  SYNTHROID, LEVOTHROID Take 100  mcg by mouth every morning.   lidocaine 5 % Commonly known as:  LIDODERM Place 3 patches onto the skin daily as needed (pain). Remove & Discard patch within 12 hours or as directed by MD   LORazepam 0.5 MG tablet Commonly known as:  ATIVAN Take 0.5 mg by mouth daily as needed for anxiety.   magnesium 30 MG tablet Take 30 mg by mouth daily.   nortriptyline 50 MG capsule Commonly known as:  PAMELOR TAKE 2 CAPSULES (100MG ) AT BEDTIME   polyethylene glycol packet Commonly known as:  MIRALAX / GLYCOLAX Take 17 g by mouth every other day.   potassium chloride SA 20 MEQ tablet Commonly known as:  K-DUR,KLOR-CON Take 1 tablet (20 mEq total) by mouth 2 (two) times daily. What changed:  how much to take   sodium phosphate enema Place 1 enema rectally once as needed (constipation). follow package directions   solifenacin 10 MG tablet Commonly known as:  VESICARE Take 10 mg by mouth daily as needed (overactive bladder).    terconazole 0.4 % vaginal cream Commonly known as:  TERAZOL 7 Place 1 applicator vaginally at bedtime as needed (for yeast infection).   traMADol 50 MG tablet Commonly known as:  ULTRAM Take 1 tablet (50 mg total) by mouth every 12 (twelve) hours as needed for moderate pain.   TRULANCE PO Take 1 capsule by mouth every other day.   VITAMIN D-3 PO Take 1 tablet by mouth every morning.            Discharge Care Instructions        Start     Ordered   10/14/16 0000  ipratropium (ATROVENT) 0.03 % nasal spray  Every 12 hours     10/14/16 1539   10/14/16 0000  Increase activity slowly     10/14/16 1539   10/14/16 0000  Diet - low sodium heart healthy     10/14/16 1539   10/14/16 0000  Discharge instructions    Comments:  You were evaluated for chest pain and had a low risk stress test and have no evidence of heart failure. It is possible your chest/upper abdominal discomfort is from reflux and/or the coughing with your cold. To treat this, continue taking your medications as directed, including nexium.  - Start atrovent nasal spray at twice daily, especially at night, to help with cold symptoms.  - Follow up with Dr. Watt Climes soon to further investigate the possibility of a GI cause to your pain.  - If you develop worsening symptoms, including worsened chest pain or trouble breathing, seek medical attention right away.   10/14/16 1539     Follow-up Information    [facility].   Why:  Registered Nurse, Physical Therapy, Aide.  Contact information: [address] [place] [address] [phone]        Shon Baton, MD. Schedule an appointment as soon as possible for a visit in 1 week(s).   Specialty:  Internal Medicine Contact information: [place] [address] [phone]        Clarene Essex, MD. Schedule an appointment as soon as possible for a visit in 4 week(s).   Specialty:  Gastroenterology Contact  information: [address] [place] [address] [phone]          Allergies  Allergen Reactions  . Iodine Other (See Comments)    ONLY IV--Makes unconscious  . Iohexol Other (See Comments)  Code: SOB, Desc: cardiac arrest w/ iv contrast, has never used 13 hr prep//alice calhoun, Onset Date: [id number]   . Metoclopramide Hcl Other (See Comments)    suicidal  . Sulfa Antibiotics Hives  . Lactose Intolerance (Gi) Diarrhea  . Lansoprazole Nausea And Vomiting    Consultations:  Cardiology  Procedures/Studies: Dg Chest 2 View  Result Date: 10/10/2016 CLINICAL DATA:  Chest pain and shortness of breath for 1 day. EXAM: CHEST  2 VIEW COMPARISON:  Radiograph 04/21/2016 FINDINGS: Left-sided pacemaker remains in place. The cardiomediastinal contours are unchanged. The lungs are clear. Pulmonary vasculature is normal. No consolidation, pleural effusion, or pneumothorax. No acute osseous abnormalities are seen. IMPRESSION: No acute abnormality. Electronically Signed   By: Jeb Levering M.D.   On: 10/10/2016 21:17   Nm Myocar Multi W/spect W/wall Motion / Ef  Result Date: 10/14/2016  There was no ST segment deviation noted during stress.  No T wave inversion was noted during stress.  There is a very small mild intensity defect present in the apical inferior and apical lateral location that is fixed and likely represents diaphragmatic and large breast attenuation. No ischemia noted.  This is a low risk study.  The left ventricular ejection fraction is normal (55-65%).  Nuclear stress EF: 61%.  The study is normal.    Nm Pulmonary Vent And Perf (v/q Scan)  Result Date: 10/11/2016 CLINICAL DATA:  Shortness of Breath EXAM: NUCLEAR MEDICINE VENTILATION - PERFUSION LUNG SCAN TECHNIQUE: Ventilation images were obtained in multiple projections using inhaled aerosol Tc-57m DTPA. Perfusion images were obtained in multiple projections after intravenous injection of Tc-36m MAA.  RADIOPHARMACEUTICALS:  31.7 mCi Technetium-47m DTPA aerosol inhalation and 4.3 mCi Technetium-5m MAA IV COMPARISON:  Chest x-ray 10/10/2016 FINDINGS: Ventilation: No focal ventilation defect. Perfusion: No wedge shaped peripheral perfusion defects to suggest acute pulmonary embolism. IMPRESSION: No evidence of pulmonary embolus. Electronically Signed   By: Rolm Baptise M.D.   On: 10/11/2016 10:48   Echocardiogram 10/12/2016:  Study Conclusions - Left ventricle: The cavity size was normal. Wall thickness was increased in a pattern of mild LVH. Systolic function was vigorous. The estimated ejection fraction was in the range of 65% to 70%. Wall motion was normal; there were no regional wall motion abnormalities.  Impressions: - Extremely limited; definity used; essentially no apical or subcostal views other than definity images; vigorous LV systolic function; trace TR; cannot R/O SOE with this study  Nuclear stress test 9/6 - 9/7:  There was no ST segment deviation noted during stress.  No T wave inversion was noted during stress.  There is a very small mild intensity defect present in the apical inferior and apical lateral location that is fixed and likely represents diaphragmatic and large breast attenuation. No ischemia noted.  This is a low risk study.  The left ventricular ejection fraction is normal (55-65%).  Nuclear stress EF: 61%.  The study is normal.   Subjective: Breathing stable, no current chest pain but continues to have intermittent chest pain. Feels like she has some post nasal drip and nonproductive cough. No fever, sputum, wheezing, palpitations, orthopnea, leg swelling.   Discharge Exam: Vitals:   10/14/16 0511 10/14/16 0745  BP: (!) 160/63 (!) 148/56  Pulse: 61 61  Resp: 18 (!) 23  Temp: 98 F (36.7 C) 97.8 F (36.6 C)  SpO2: 96% 96%   General: Pt is alert, awake, not in acute distress Cardiovascular: RRR, S1/S2 +, no rubs, no  gallops Respiratory: CTA bilaterally, no  wheezing, no rhonchi Abdominal: Soft, NT, ND, bowel sounds + Extremities: No edema, no cyanosis  Labs: Basic Metabolic Panel:  Recent Labs Lab 10/10/16 2003 10/11/16 0347 10/12/16 0332  NA 135  --  138  K 3.3*  --  3.5  CL 98*  --  102  CO2 27  --  27  GLUCOSE 97  --  160*  BUN 20  --  19  CREATININE 1.76* 1.79* 1.64*  CALCIUM 9.0  --  8.6*   CBC:  Recent Labs Lab 10/10/16 2003 10/11/16 0347  WBC 7.0 6.7  HGB 10.7* 10.2*  HCT 33.6* 31.9*  MCV 95.2 94.7  PLT 270 264   Cardiac Enzymes:  Recent Labs Lab 10/11/16 0347 10/11/16 1051 10/11/16 1610  TROPONINI <0.03 <0.03 <0.03   CBG:  Recent Labs Lab 10/14/16 0015 10/14/16 0519 10/14/16 0744 10/14/16 1049 10/14/16 1255  GLUCAP 189* 158* 141* 149* 136*   Hgb A1c  Recent Labs  10/12/16 0332  HGBA1C 7.6*   Urinalysis    Component Value Date/Time   COLORURINE YELLOW 06/12/2015 0848   APPEARANCEUR HAZY (A) 06/12/2015 0848   LABSPEC 1.011 06/12/2015 0848   PHURINE 5.5 06/12/2015 0848   GLUCOSEU NEGATIVE 06/12/2015 0848   HGBUR NEGATIVE 06/12/2015 0848   BILIRUBINUR NEGATIVE 06/12/2015 0848   KETONESUR NEGATIVE 06/12/2015 0848   PROTEINUR NEGATIVE 06/12/2015 0848   UROBILINOGEN 1.0 11/28/2014 1125   NITRITE NEGATIVE 06/12/2015 0848   LEUKOCYTESUR NEGATIVE 06/12/2015 0848   Time coordinating discharge: Approximately 9 minutes  Vance Gather, MD  [facility] 10/14/2016, 3:39 PM Pager [phone]

## 2016-10-14 NOTE — Progress Notes (Signed)
Physical Therapy Treatment Patient Details Name: Tara Jackson MRN: [id number] DOB: 1940-02-09 Today's Date: 10/14/2016    History of Present Illness Patient is a 76 y/o female who presents with chest pain and SOB. Cardiac markers and EKG not showing anything acute; CXR- unremarkable. PMH includes PE, stroke, HTN, HLD, DM, glaucoma, DVT, diastolic CHF, sinus node dysfunction s/p pacemaker placement    PT Comments    Patient progressing slowly towards PT goals. Tolerated short distance ambulation with Min guard assist for safety. Continues to require assist for sit to stands esp from lower surfaces. Pt with 2/4 DOE but VSS throughout session. Feels better with spouse present during mobility. High fall risk. Will follow acutely.     Follow Up Recommendations  Home health PT;Supervision for mobility/OOB     Equipment Recommendations  None recommended by PT    Recommendations for Other Services       Precautions / Restrictions Precautions Precautions: Fall Restrictions Weight Bearing Restrictions: No    Mobility  Bed Mobility Overal bed mobility: Needs Assistance Bed Mobility: Supine to Sit;Sit to Supine     Supine to sit: Supervision;HOB elevated Sit to supine: Supervision;HOB elevated   General bed mobility comments: No assist needed, use of rail and increased time.   Transfers Overall transfer level: Needs assistance Equipment used: Rolling walker (2 wheeled) Transfers: Sit to/from Stand Sit to Stand: Mod assist         General transfer comment: Assist to power to standing with cues for hand placement and use of body momentum. Stood from [employer], from low toilet x1 using bar.   Ambulation/Gait Ambulation/Gait assistance: Min guard Ambulation Distance (Feet): 16 Feet (x2 bouts) Assistive device: Rolling walker (2 wheeled) Gait Pattern/deviations: Step-through pattern;Decreased stride length;Trunk flexed;Wide base of support Gait velocity: decreased   General  Gait Details: Slow, mildly unsteady gait. 2/4 DOE. VSS. 1 seated rest break.    Stairs            Wheelchair Mobility    Modified Rankin (Stroke Patients Only)       Balance Overall balance assessment: Needs assistance Sitting-balance support: Feet supported;No upper extremity supported Sitting balance-Leahy Scale: Fair Sitting balance - Comments: Able to perform pericare without difficulty.   Standing balance support: During functional activity;Single extremity supported Standing balance-Leahy Scale: Poor Standing balance comment: Requires BUE support in standing using RW.                            Cognition Arousal/Alertness: Awake/alert Behavior During Therapy: WFL for tasks assessed/performed Overall Cognitive Status: Within Functional Limits for tasks assessed                                        Exercises Other Exercises Other Exercises: Sit to stand x3 with emphasis on slow descent for quad control    General Comments General comments (skin integrity, edema, etc.): Spouse present during session.      Pertinent Vitals/Pain Pain Assessment: No/denies pain    Home Living                      Prior Function            PT Goals (current goals can now be found in the care plan section) Progress towards PT goals: Progressing toward goals    Frequency  Min 3X/week      PT Plan Current plan remains appropriate    Co-evaluation              AM-PAC PT "6 Clicks" Daily Activity  Outcome Measure  Difficulty turning over in bed (including adjusting bedclothes, sheets and blankets)?: None Difficulty moving from lying on back to sitting on the side of the bed? : None Difficulty sitting down on and standing up from a chair with arms (e.g., wheelchair, bedside commode, etc,.)?: Unable Help needed moving to and from a bed to chair (including a wheelchair)?: A Little Help needed walking in hospital room?: A  Little Help needed climbing 3-5 steps with a railing? : A Lot 6 Click Score: 17    End of Session Equipment Utilized During Treatment: Gait belt Activity Tolerance: Patient limited by fatigue Patient left: in bed;with call bell/phone within reach;with family/visitor present;with bed alarm set Nurse Communication: Mobility status PT Visit Diagnosis: Unsteadiness on feet (R26.81);Muscle weakness (generalized) (M62.81);Other (comment) (DOE)     Time: 2130-8657 PT Time Calculation (min) (ACUTE ONLY): 21 min  Charges:  $Therapeutic Activity: 8-22 mins                    G Codes:       Wray Kearns, PT, DPT [phone]     [place] 10/14/2016, 2:18 PM

## 2016-10-17 DIAGNOSIS — E1129 Type 2 diabetes mellitus with other diabetic kidney complication: Secondary | ICD-10-CM | POA: Diagnosis not present

## 2016-10-17 DIAGNOSIS — D638 Anemia in other chronic diseases classified elsewhere: Secondary | ICD-10-CM | POA: Diagnosis not present

## 2016-10-17 DIAGNOSIS — E039 Hypothyroidism, unspecified: Secondary | ICD-10-CM | POA: Diagnosis not present

## 2016-10-17 DIAGNOSIS — I130 Hypertensive heart and chronic kidney disease with heart failure and stage 1 through stage 4 chronic kidney disease, or unspecified chronic kidney disease: Secondary | ICD-10-CM | POA: Diagnosis not present

## 2016-10-17 DIAGNOSIS — G4730 Sleep apnea, unspecified: Secondary | ICD-10-CM | POA: Diagnosis not present

## 2016-10-17 DIAGNOSIS — I5032 Chronic diastolic (congestive) heart failure: Secondary | ICD-10-CM | POA: Diagnosis not present

## 2016-10-17 DIAGNOSIS — I2510 Atherosclerotic heart disease of native coronary artery without angina pectoris: Secondary | ICD-10-CM | POA: Diagnosis not present

## 2016-10-17 DIAGNOSIS — N183 Chronic kidney disease, stage 3 (moderate): Secondary | ICD-10-CM | POA: Diagnosis not present

## 2016-10-17 DIAGNOSIS — E785 Hyperlipidemia, unspecified: Secondary | ICD-10-CM | POA: Diagnosis not present

## 2016-10-18 DIAGNOSIS — R0789 Other chest pain: Secondary | ICD-10-CM | POA: Diagnosis not present

## 2016-10-18 DIAGNOSIS — E1149 Type 2 diabetes mellitus with other diabetic neurological complication: Secondary | ICD-10-CM | POA: Diagnosis not present

## 2016-10-18 DIAGNOSIS — Z6841 Body Mass Index (BMI) 40.0 and over, adult: Secondary | ICD-10-CM | POA: Diagnosis not present

## 2016-10-18 DIAGNOSIS — Z794 Long term (current) use of insulin: Secondary | ICD-10-CM | POA: Diagnosis not present

## 2016-10-18 DIAGNOSIS — I129 Hypertensive chronic kidney disease with stage 1 through stage 4 chronic kidney disease, or unspecified chronic kidney disease: Secondary | ICD-10-CM | POA: Diagnosis not present

## 2016-10-18 DIAGNOSIS — R69 Illness, unspecified: Secondary | ICD-10-CM | POA: Diagnosis not present

## 2016-10-18 DIAGNOSIS — D649 Anemia, unspecified: Secondary | ICD-10-CM | POA: Diagnosis not present

## 2016-10-18 DIAGNOSIS — I517 Cardiomegaly: Secondary | ICD-10-CM | POA: Diagnosis not present

## 2016-10-19 DIAGNOSIS — E039 Hypothyroidism, unspecified: Secondary | ICD-10-CM | POA: Diagnosis not present

## 2016-10-19 DIAGNOSIS — N183 Chronic kidney disease, stage 3 (moderate): Secondary | ICD-10-CM | POA: Diagnosis not present

## 2016-10-19 DIAGNOSIS — D638 Anemia in other chronic diseases classified elsewhere: Secondary | ICD-10-CM | POA: Diagnosis not present

## 2016-10-19 DIAGNOSIS — I130 Hypertensive heart and chronic kidney disease with heart failure and stage 1 through stage 4 chronic kidney disease, or unspecified chronic kidney disease: Secondary | ICD-10-CM | POA: Diagnosis not present

## 2016-10-19 DIAGNOSIS — I5032 Chronic diastolic (congestive) heart failure: Secondary | ICD-10-CM | POA: Diagnosis not present

## 2016-10-19 DIAGNOSIS — E1129 Type 2 diabetes mellitus with other diabetic kidney complication: Secondary | ICD-10-CM | POA: Diagnosis not present

## 2016-10-19 DIAGNOSIS — G4730 Sleep apnea, unspecified: Secondary | ICD-10-CM | POA: Diagnosis not present

## 2016-10-19 DIAGNOSIS — I2510 Atherosclerotic heart disease of native coronary artery without angina pectoris: Secondary | ICD-10-CM | POA: Diagnosis not present

## 2016-10-19 DIAGNOSIS — E785 Hyperlipidemia, unspecified: Secondary | ICD-10-CM | POA: Diagnosis not present

## 2016-10-20 ENCOUNTER — Telehealth: Payer: Self-pay

## 2016-10-20 ENCOUNTER — Ambulatory Visit: Payer: Medicare HMO | Admitting: Neurology

## 2016-10-20 NOTE — Telephone Encounter (Signed)
PATIENTS husband call to cancel appt today. Pt was discharge from hospital on 10/14/2016. Caller did not say what hospital his wife was at per lisa.

## 2016-10-21 DIAGNOSIS — E785 Hyperlipidemia, unspecified: Secondary | ICD-10-CM | POA: Diagnosis not present

## 2016-10-21 DIAGNOSIS — E039 Hypothyroidism, unspecified: Secondary | ICD-10-CM | POA: Diagnosis not present

## 2016-10-21 DIAGNOSIS — I130 Hypertensive heart and chronic kidney disease with heart failure and stage 1 through stage 4 chronic kidney disease, or unspecified chronic kidney disease: Secondary | ICD-10-CM | POA: Diagnosis not present

## 2016-10-21 DIAGNOSIS — G4730 Sleep apnea, unspecified: Secondary | ICD-10-CM | POA: Diagnosis not present

## 2016-10-21 DIAGNOSIS — E1129 Type 2 diabetes mellitus with other diabetic kidney complication: Secondary | ICD-10-CM | POA: Diagnosis not present

## 2016-10-21 DIAGNOSIS — D638 Anemia in other chronic diseases classified elsewhere: Secondary | ICD-10-CM | POA: Diagnosis not present

## 2016-10-21 DIAGNOSIS — I2510 Atherosclerotic heart disease of native coronary artery without angina pectoris: Secondary | ICD-10-CM | POA: Diagnosis not present

## 2016-10-21 DIAGNOSIS — I5032 Chronic diastolic (congestive) heart failure: Secondary | ICD-10-CM | POA: Diagnosis not present

## 2016-10-21 DIAGNOSIS — N183 Chronic kidney disease, stage 3 (moderate): Secondary | ICD-10-CM | POA: Diagnosis not present

## 2016-10-22 DIAGNOSIS — I2510 Atherosclerotic heart disease of native coronary artery without angina pectoris: Secondary | ICD-10-CM | POA: Diagnosis not present

## 2016-10-22 DIAGNOSIS — N183 Chronic kidney disease, stage 3 (moderate): Secondary | ICD-10-CM | POA: Diagnosis not present

## 2016-10-22 DIAGNOSIS — I130 Hypertensive heart and chronic kidney disease with heart failure and stage 1 through stage 4 chronic kidney disease, or unspecified chronic kidney disease: Secondary | ICD-10-CM | POA: Diagnosis not present

## 2016-10-22 DIAGNOSIS — E1129 Type 2 diabetes mellitus with other diabetic kidney complication: Secondary | ICD-10-CM | POA: Diagnosis not present

## 2016-10-22 DIAGNOSIS — I5032 Chronic diastolic (congestive) heart failure: Secondary | ICD-10-CM | POA: Diagnosis not present

## 2016-10-22 DIAGNOSIS — G4730 Sleep apnea, unspecified: Secondary | ICD-10-CM | POA: Diagnosis not present

## 2016-10-22 DIAGNOSIS — E785 Hyperlipidemia, unspecified: Secondary | ICD-10-CM | POA: Diagnosis not present

## 2016-10-22 DIAGNOSIS — E039 Hypothyroidism, unspecified: Secondary | ICD-10-CM | POA: Diagnosis not present

## 2016-10-22 DIAGNOSIS — I509 Heart failure, unspecified: Secondary | ICD-10-CM | POA: Diagnosis not present

## 2016-10-22 DIAGNOSIS — I10 Essential (primary) hypertension: Secondary | ICD-10-CM | POA: Diagnosis not present

## 2016-10-22 DIAGNOSIS — D638 Anemia in other chronic diseases classified elsewhere: Secondary | ICD-10-CM | POA: Diagnosis not present

## 2016-10-23 ENCOUNTER — Encounter (HOSPITAL_COMMUNITY): Payer: Self-pay | Admitting: Emergency Medicine

## 2016-10-23 ENCOUNTER — Inpatient Hospital Stay (HOSPITAL_COMMUNITY): Payer: Medicare HMO

## 2016-10-23 ENCOUNTER — Emergency Department (HOSPITAL_COMMUNITY): Payer: Medicare HMO

## 2016-10-23 ENCOUNTER — Inpatient Hospital Stay (HOSPITAL_COMMUNITY)
Admission: EM | Admit: 2016-10-23 | Discharge: 2016-10-26 | DRG: 683 | Disposition: A | Discharge status: Home / Self Care | Payer: Medicare HMO | Attending: Family Medicine | Admitting: Family Medicine

## 2016-10-23 DIAGNOSIS — Q780 Osteogenesis imperfecta: Secondary | ICD-10-CM | POA: Diagnosis not present

## 2016-10-23 DIAGNOSIS — Z8673 Personal history of transient ischemic attack (TIA), and cerebral infarction without residual deficits: Secondary | ICD-10-CM

## 2016-10-23 DIAGNOSIS — D638 Anemia in other chronic diseases classified elsewhere: Secondary | ICD-10-CM | POA: Diagnosis present

## 2016-10-23 DIAGNOSIS — Z888 Allergy status to other drugs, medicaments and biological substances status: Secondary | ICD-10-CM

## 2016-10-23 DIAGNOSIS — S199XXA Unspecified injury of neck, initial encounter: Secondary | ICD-10-CM | POA: Diagnosis not present

## 2016-10-23 DIAGNOSIS — E1122 Type 2 diabetes mellitus with diabetic chronic kidney disease: Secondary | ICD-10-CM | POA: Diagnosis present

## 2016-10-23 DIAGNOSIS — E861 Hypovolemia: Secondary | ICD-10-CM | POA: Diagnosis not present

## 2016-10-23 DIAGNOSIS — Z9842 Cataract extraction status, left eye: Secondary | ICD-10-CM | POA: Diagnosis not present

## 2016-10-23 DIAGNOSIS — I152 Hypertension secondary to endocrine disorders: Secondary | ICD-10-CM | POA: Diagnosis present

## 2016-10-23 DIAGNOSIS — Z7902 Long term (current) use of antithrombotics/antiplatelets: Secondary | ICD-10-CM

## 2016-10-23 DIAGNOSIS — Z950 Presence of cardiac pacemaker: Secondary | ICD-10-CM | POA: Diagnosis not present

## 2016-10-23 DIAGNOSIS — K589 Irritable bowel syndrome without diarrhea: Secondary | ICD-10-CM | POA: Diagnosis present

## 2016-10-23 DIAGNOSIS — E860 Dehydration: Secondary | ICD-10-CM | POA: Diagnosis present

## 2016-10-23 DIAGNOSIS — Z794 Long term (current) use of insulin: Secondary | ICD-10-CM | POA: Diagnosis not present

## 2016-10-23 DIAGNOSIS — Z96653 Presence of artificial knee joint, bilateral: Secondary | ICD-10-CM | POA: Diagnosis present

## 2016-10-23 DIAGNOSIS — R55 Syncope and collapse: Secondary | ICD-10-CM | POA: Diagnosis not present

## 2016-10-23 DIAGNOSIS — Z91041 Radiographic dye allergy status: Secondary | ICD-10-CM

## 2016-10-23 DIAGNOSIS — N183 Chronic kidney disease, stage 3 (moderate): Secondary | ICD-10-CM | POA: Diagnosis present

## 2016-10-23 DIAGNOSIS — W010XXA Fall on same level from slipping, tripping and stumbling without subsequent striking against object, initial encounter: Secondary | ICD-10-CM | POA: Diagnosis present

## 2016-10-23 DIAGNOSIS — K219 Gastro-esophageal reflux disease without esophagitis: Secondary | ICD-10-CM | POA: Diagnosis present

## 2016-10-23 DIAGNOSIS — I495 Sick sinus syndrome: Secondary | ICD-10-CM | POA: Diagnosis not present

## 2016-10-23 DIAGNOSIS — W19XXXA Unspecified fall, initial encounter: Secondary | ICD-10-CM | POA: Diagnosis not present

## 2016-10-23 DIAGNOSIS — Y9301 Activity, walking, marching and hiking: Secondary | ICD-10-CM | POA: Diagnosis present

## 2016-10-23 DIAGNOSIS — R404 Transient alteration of awareness: Secondary | ICD-10-CM | POA: Diagnosis not present

## 2016-10-23 DIAGNOSIS — Z6841 Body Mass Index (BMI) 40.0 and over, adult: Secondary | ICD-10-CM

## 2016-10-23 DIAGNOSIS — E669 Obesity, unspecified: Secondary | ICD-10-CM | POA: Diagnosis present

## 2016-10-23 DIAGNOSIS — E739 Lactose intolerance, unspecified: Secondary | ICD-10-CM | POA: Diagnosis present

## 2016-10-23 DIAGNOSIS — N189 Chronic kidney disease, unspecified: Secondary | ICD-10-CM

## 2016-10-23 DIAGNOSIS — N179 Acute kidney failure, unspecified: Secondary | ICD-10-CM | POA: Diagnosis not present

## 2016-10-23 DIAGNOSIS — Z79899 Other long term (current) drug therapy: Secondary | ICD-10-CM | POA: Diagnosis not present

## 2016-10-23 DIAGNOSIS — E1121 Type 2 diabetes mellitus with diabetic nephropathy: Secondary | ICD-10-CM | POA: Diagnosis present

## 2016-10-23 DIAGNOSIS — Z23 Encounter for immunization: Secondary | ICD-10-CM

## 2016-10-23 DIAGNOSIS — D649 Anemia, unspecified: Secondary | ICD-10-CM | POA: Diagnosis present

## 2016-10-23 DIAGNOSIS — I10 Essential (primary) hypertension: Secondary | ICD-10-CM | POA: Diagnosis present

## 2016-10-23 DIAGNOSIS — Z9841 Cataract extraction status, right eye: Secondary | ICD-10-CM

## 2016-10-23 DIAGNOSIS — S0990XA Unspecified injury of head, initial encounter: Secondary | ICD-10-CM | POA: Diagnosis not present

## 2016-10-23 DIAGNOSIS — E1165 Type 2 diabetes mellitus with hyperglycemia: Secondary | ICD-10-CM | POA: Diagnosis present

## 2016-10-23 DIAGNOSIS — E1169 Type 2 diabetes mellitus with other specified complication: Secondary | ICD-10-CM | POA: Diagnosis present

## 2016-10-23 DIAGNOSIS — E039 Hypothyroidism, unspecified: Secondary | ICD-10-CM | POA: Diagnosis present

## 2016-10-23 DIAGNOSIS — Z961 Presence of intraocular lens: Secondary | ICD-10-CM | POA: Diagnosis present

## 2016-10-23 DIAGNOSIS — R079 Chest pain, unspecified: Secondary | ICD-10-CM | POA: Diagnosis not present

## 2016-10-23 DIAGNOSIS — R296 Repeated falls: Secondary | ICD-10-CM | POA: Diagnosis present

## 2016-10-23 DIAGNOSIS — Z882 Allergy status to sulfonamides status: Secondary | ICD-10-CM

## 2016-10-23 DIAGNOSIS — E876 Hypokalemia: Secondary | ICD-10-CM | POA: Diagnosis present

## 2016-10-23 DIAGNOSIS — R51 Headache: Secondary | ICD-10-CM | POA: Diagnosis not present

## 2016-10-23 DIAGNOSIS — I5032 Chronic diastolic (congestive) heart failure: Secondary | ICD-10-CM | POA: Diagnosis not present

## 2016-10-23 DIAGNOSIS — Z9981 Dependence on supplemental oxygen: Secondary | ICD-10-CM

## 2016-10-23 DIAGNOSIS — I519 Heart disease, unspecified: Secondary | ICD-10-CM | POA: Diagnosis present

## 2016-10-23 DIAGNOSIS — M810 Age-related osteoporosis without current pathological fracture: Secondary | ICD-10-CM | POA: Diagnosis present

## 2016-10-23 LAB — URINALYSIS, ROUTINE W REFLEX MICROSCOPIC
Bilirubin Urine: NEGATIVE
Glucose, UA: NEGATIVE mg/dL
Hgb urine dipstick: NEGATIVE
Ketones, ur: NEGATIVE mg/dL
Leukocytes, UA: NEGATIVE
Nitrite: NEGATIVE
Protein, ur: NEGATIVE mg/dL
Specific Gravity, Urine: 1.015 (ref 1.005–1.030)
pH: 5.5 (ref 5.0–8.0)

## 2016-10-23 LAB — CBC
HCT: 34.4 % — ABNORMAL LOW (ref 36.0–46.0)
Hemoglobin: 11 g/dL — ABNORMAL LOW (ref 12.0–15.0)
MCH: 30.5 pg (ref 26.0–34.0)
MCHC: 32 g/dL (ref 30.0–36.0)
MCV: 95.3 fL (ref 78.0–100.0)
Platelets: 238 10*3/uL (ref 150–400)
RBC: 3.61 MIL/uL — ABNORMAL LOW (ref 3.87–5.11)
RDW: 13 % (ref 11.5–15.5)
WBC: 7.4 10*3/uL (ref 4.0–10.5)

## 2016-10-23 LAB — BASIC METABOLIC PANEL
Anion gap: 11 (ref 5–15)
BUN: 40 mg/dL — ABNORMAL HIGH (ref 6–20)
CO2: 24 mmol/L (ref 22–32)
Calcium: 8.8 mg/dL — ABNORMAL LOW (ref 8.9–10.3)
Chloride: 101 mmol/L (ref 101–111)
Creatinine, Ser: 3.55 mg/dL — ABNORMAL HIGH (ref 0.44–1.00)
GFR calc Af Amer: 13 mL/min — ABNORMAL LOW (ref >60)
GFR calc non Af Amer: 11 mL/min — ABNORMAL LOW (ref >60)
Glucose, Bld: 260 mg/dL — ABNORMAL HIGH (ref 65–99)
Potassium: 4 mmol/L (ref 3.5–5.1)
Sodium: 136 mmol/L (ref 135–145)

## 2016-10-23 LAB — TROPONIN I: Troponin I: <0.03 ng/mL (ref <0.03)

## 2016-10-23 LAB — CBG MONITORING, ED: Glucose-Capillary: 267 mg/dL — ABNORMAL HIGH (ref 65–99)

## 2016-10-23 LAB — GLUCOSE, CAPILLARY: Glucose-Capillary: 265 mg/dL — ABNORMAL HIGH (ref 65–99)

## 2016-10-23 MED ORDER — LACTATED RINGERS IV BOLUS (SEPSIS)
1000.0000 mL | Freq: Once | INTRAVENOUS | Status: AC
  Administered 2016-10-23: 1000 mL via INTRAVENOUS

## 2016-10-23 MED ORDER — MAGNESIUM 30 MG PO TABS: 30.0000 mg | ORAL_TABLET | Freq: Every day | ORAL | Status: DC

## 2016-10-23 MED ORDER — INSULIN ASPART 100 UNIT/ML ~~LOC~~ SOLN
0.0000 [IU] | Freq: Three times a day (TID) | SUBCUTANEOUS | Status: DC
  Administered 2016-10-24 (×3): 11 [IU] via SUBCUTANEOUS
  Administered 2016-10-25: 3 [IU] via SUBCUTANEOUS
  Administered 2016-10-25: 4 [IU] via SUBCUTANEOUS
  Administered 2016-10-25: 7 [IU] via SUBCUTANEOUS
  Administered 2016-10-26: 3 [IU] via SUBCUTANEOUS

## 2016-10-23 MED ORDER — ACETAMINOPHEN 650 MG RE SUPP: 650.0000 mg | Freq: Four times a day (QID) | RECTAL | Status: DC | PRN

## 2016-10-23 MED ORDER — CLONAZEPAM 1 MG PO TABS
1.0000 mg | ORAL_TABLET | Freq: Every morning | ORAL | Status: DC
  Administered 2016-10-24 – 2016-10-26 (×3): 1 mg via ORAL
  Filled 2016-10-23 (×3): qty 1

## 2016-10-23 MED ORDER — PANTOPRAZOLE SODIUM 40 MG PO TBEC
40.0000 mg | DELAYED_RELEASE_TABLET | Freq: Every day | ORAL | Status: DC
  Administered 2016-10-23 – 2016-10-26 (×4): 40 mg via ORAL
  Filled 2016-10-23 (×4): qty 1

## 2016-10-23 MED ORDER — IPRATROPIUM BROMIDE 0.03 % NA SOLN
2.0000 | Freq: Two times a day (BID) | NASAL | Status: DC | PRN
  Filled 2016-10-23: qty 30

## 2016-10-23 MED ORDER — CLONAZEPAM 0.5 MG PO TABS
0.5000 mg | ORAL_TABLET | Freq: Every day | ORAL | Status: DC
  Administered 2016-10-24 – 2016-10-25 (×2): 0.5 mg via ORAL
  Filled 2016-10-23 (×2): qty 1

## 2016-10-23 MED ORDER — INSULIN ASPART 100 UNIT/ML ~~LOC~~ SOLN
0.0000 [IU] | Freq: Every day | SUBCUTANEOUS | Status: DC
  Administered 2016-10-23: 3 [IU] via SUBCUTANEOUS

## 2016-10-23 MED ORDER — INFLUENZA VAC SPLIT HIGH-DOSE 0.5 ML IM SUSY
0.5000 mL | PREFILLED_SYRINGE | INTRAMUSCULAR | Status: AC
  Administered 2016-10-24: 0.5 mL via INTRAMUSCULAR
  Filled 2016-10-23 (×2): qty 0.5

## 2016-10-23 MED ORDER — PANTOPRAZOLE SODIUM 40 MG PO TBEC: 40.0000 mg | DELAYED_RELEASE_TABLET | Freq: Every day | ORAL | Status: DC

## 2016-10-23 MED ORDER — POLYETHYLENE GLYCOL 3350 17 G PO PACK
17.0000 g | PACK | ORAL | Status: DC
  Filled 2016-10-23 (×2): qty 1

## 2016-10-23 MED ORDER — IPRATROPIUM-ALBUTEROL 0.5-2.5 (3) MG/3ML IN SOLN: 3.0000 mL | RESPIRATORY_TRACT | Status: DC | PRN

## 2016-10-23 MED ORDER — LEVOTHYROXINE SODIUM 100 MCG PO TABS
100.0000 ug | ORAL_TABLET | Freq: Every morning | ORAL | Status: DC
  Administered 2016-10-24 – 2016-10-26 (×3): 100 ug via ORAL
  Filled 2016-10-23 (×3): qty 1

## 2016-10-23 MED ORDER — NORTRIPTYLINE HCL 25 MG PO CAPS
100.0000 mg | ORAL_CAPSULE | Freq: Every day | ORAL | Status: DC
  Administered 2016-10-24 – 2016-10-26 (×3): 100 mg via ORAL
  Filled 2016-10-23 (×3): qty 4

## 2016-10-23 MED ORDER — ASPIRIN-ACETAMINOPHEN-CAFFEINE 250-250-65 MG PO TABS: 1.0000 | ORAL_TABLET | Freq: Four times a day (QID) | ORAL | Status: DC | PRN

## 2016-10-23 MED ORDER — HEPARIN SODIUM (PORCINE) 5000 UNIT/ML IJ SOLN
5000.0000 [IU] | Freq: Three times a day (TID) | INTRAMUSCULAR | Status: DC
  Administered 2016-10-23 – 2016-10-26 (×8): 5000 [IU] via SUBCUTANEOUS
  Filled 2016-10-23 (×8): qty 1

## 2016-10-23 MED ORDER — BUSPIRONE HCL 10 MG PO TABS
10.0000 mg | ORAL_TABLET | Freq: Every morning | ORAL | Status: DC
  Administered 2016-10-24 – 2016-10-26 (×3): 10 mg via ORAL
  Filled 2016-10-23 (×3): qty 1

## 2016-10-23 MED ORDER — ONDANSETRON HCL 4 MG PO TABS: 4.0000 mg | ORAL_TABLET | Freq: Four times a day (QID) | ORAL | Status: DC | PRN

## 2016-10-23 MED ORDER — SODIUM CHLORIDE 0.9 % IV SOLN
INTRAVENOUS | Status: DC
  Administered 2016-10-23 – 2016-10-26 (×5): via INTRAVENOUS

## 2016-10-23 MED ORDER — ACETAMINOPHEN 325 MG PO TABS
650.0000 mg | ORAL_TABLET | Freq: Four times a day (QID) | ORAL | Status: DC | PRN
  Administered 2016-10-24: 650 mg via ORAL
  Filled 2016-10-23 (×2): qty 2

## 2016-10-23 MED ORDER — TRAMADOL HCL 50 MG PO TABS: 50.0000 mg | ORAL_TABLET | Freq: Two times a day (BID) | ORAL | Status: DC | PRN

## 2016-10-23 MED ORDER — LORAZEPAM 1 MG PO TABS: 0.5000 mg | ORAL_TABLET | Freq: Every day | ORAL | Status: DC | PRN

## 2016-10-23 MED ORDER — LIDOCAINE 5 % EX PTCH: 3.0000 | MEDICATED_PATCH | Freq: Every day | CUTANEOUS | Status: DC | PRN

## 2016-10-23 MED ORDER — DISPOSABLE ENEMA 19-7 GM/118ML RE ENEM: 1.0000 | ENEMA | Freq: Once | RECTAL | Status: DC | PRN

## 2016-10-23 MED ORDER — ONDANSETRON HCL 4 MG/2ML IJ SOLN: 4.0000 mg | Freq: Four times a day (QID) | INTRAMUSCULAR | Status: DC | PRN

## 2016-10-23 MED ORDER — CLONAZEPAM 0.5 MG PO TABS: 0.5000 mg | ORAL_TABLET | Freq: Two times a day (BID) | ORAL | Status: DC

## 2016-10-23 NOTE — ED Notes (Signed)
Patient transported to X-ray 

## 2016-10-23 NOTE — ED Provider Notes (Signed)
[facility] DEPT Provider Note   CSN: [id number] Arrival date & time: 10/23/16  1309     History   Chief Complaint Chief Complaint  Patient presents with  . Loss of Consciousness    HPI Tara Jackson is a 76 y.o. female.  HPI   Tara Jackson is a 76 y.o. female, with a history of Anemia, asthma, DM, neuropathy, DVT, PE, HTN, and pacemaker, presenting to the ED with A fall that occurred this morning around 10:30 AM. Patient states she was walking from the bed to the bathroom when she lost her balance and fell backwards, striking the back of her head on the bathroom door. Unknown LOC. She has had at least 4 falls in the last 2-3 weeks. Today's fall was unwitnessed. She also complains of pain to the back of her neck and to the musculature flanking the neck. Denies nausea, vomiting, weakness, numbness, facial droop, changes in bowel or bladder function, back pain, headache, chest pain, shortness of breath, abdominal pain, or any other complaints.    Past Medical History:  Diagnosis Date  . Anemia   . Anxiety   . Arthritis   . Asthma   . Brittle bone disease   . Cataract   . Chest pain    a. minimal CAD by cath in 2014 with 40% RI stenosis. b. low-risk NST in 11/2014.  Marland Kitchen Complication of anesthesia    hard to wake up after anesthesia, trouble turning head  . Depression   . Diabetes mellitus type 2, insulin dependent ([place])   . Diabetic neuropathy ([place])   . Diastolic dysfunction, left ventricle 11/30/14   EF 55%, grade 1 DD  . DVT (deep venous thrombosis) ([place])    "18 in RLE; 7 LLE prior to PE" (03/24/2015)  . Family history of adverse reaction to anesthesia    " the whole family is hard to wake up"  . Glaucoma   . H/O hiatal hernia   . Hyperlipidemia   . Hypertension associated with diabetes ([place])   . Hypothyroidism   . Kidney stones    "passed them"  . Memory loss 06/03/2014  . Normal cardiac stress test 11/30/14   Negative Myoview  . On home oxygen therapy    "2L oxygen concentrator @ night"  . Osteoporosis   . Oxygen desaturation during sleep    wears 2 liters of oxygen at night   . Palpitations 35years ago   Cardionet monitor - revealed mostly normal sinus rhythm, sinus bradycardia with first-degree A-V block heart rates mostly in the 50s and 60s with some 70s. No arrhythmias, PVCs or PACs noted.  . 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 Pneumonia   . Presence of permanent cardiac pacemaker   . Restless legs syndrome   . Sleep apnea    cpap disontinued; test done 07/14/2008 ordered per  [place]rum  . Spinal headache   . Spinal stenosis    L 3, L 4 and L 5, and C 1, C 2 and C3  . Stroke [facility]) 04-19-2013   tia and stroke. Weakness Rt hand  . 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 s/p [employer] PPM in 03/2015 by Dr. Lovena Le secondary to sinus node dysfunction.   Marland Kitchen TIA (transient ischemic attack) March 2014    Patient Active Problem List   Diagnosis Date Noted  . Chronic  anemia 10/11/2016  . Pacemaker 04/26/2015  . Chronic daily headache 04/07/2015  . Symptomatic bradycardia 03/24/2015  . Sinus node dysfunction ([place]) 03/24/2015  . Chronic diastolic heart failure ([place])   . Diabetes mellitus type 2 in obese ([place])   . Normal coronary arteries 01/14/2015  . Normal cardiac stress test 11/30/2014  . Bradycardia 11/28/2014  . Type 2 diabetes mellitus with renal manifestations ([place]) 11/28/2014  . Morbid obesity ([place]) 11/28/2014  . CKD stage 3 due to type 2 diabetes mellitus ([place]) 11/28/2014  . Memory loss 06/03/2014  . AKI (acute kidney injury) ([place])   . Chest pain 04/19/2014  . Pleuritic chest pain 04/19/2014  . Diabetes mellitus ([place]) 04/19/2014  . Edema of both legs 04/19/2014  . Dyspnea   . SOB (shortness of breath)   . Tremor of right hand 09/20/2013  . Headache(784.0) 09/19/2013  . Restless legs syndrome (RLS) 09/19/2013    . Constipation due to pain medication 08/21/2013  . Ankle edema 08/21/2013  . PVC's (premature ventricular contractions) 02/10/2013  . Diastolic dysfunction, left ventricle   . 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 PULMONARY EMBOLISM, HX OF 10/02/2009  . ALLERGIC RHINITIS 04/07/2009  . Sleep apnea 07/09/2008  . Hyperlipidemia with target LDL less than 130 05/20/2008  . Essential hypertension 05/20/2008    Past Surgical History:  Procedure Laterality Date  . ABDOMINAL HYSTERECTOMY    . APPENDECTOMY    . BACK SURGERY     steroid inj  . CARDIAC CATHETERIZATION  02/14/2012   no evidence of obstructive coronary disease ,low EDP with normal EF  . CATARACT EXTRACTION W/ INTRAOCULAR LENS  IMPLANT, BILATERAL Bilateral 2000s  . CHOLECYSTECTOMY    . DOPPLER ECHOCARDIOGRAPHY  2014; October 2016   a. EF 31-51%; normal diastolic pressures, no regional wall motion motion abnormalities;; b/ F 55-60%. Normal wall motion. GR 1 DD. No valve lesions  . EP IMPLANTABLE DEVICE N/A 03/24/2015   [facility] [employer] PPM, Dr. Lovena Le  . ESOPHAGOGASTRODUODENOSCOPY  08/09/2011   Procedure: ESOPHAGOGASTRODUODENOSCOPY (EGD);  Surgeon: Jeryl Columbia, MD;  Location: [facility] END[facility];  Service: Endoscopy;  Laterality: N/A;  . ESOPHAGOGASTRODUODENOSCOPY (EGD) WITH PROPOFOL N/A 11/12/2013   Procedure: ESOPHAGOGASTRODUODENOSCOPY (EGD) WITH PROPOFOL;  Surgeon: Jeryl Columbia, MD;  Location: [facility] ENDOSCOPY;  Service: Endoscopy;  Laterality: N/A;  . ESOPHAGOGASTRODUODENOSCOPY (EGD) WITH PROPOFOL N/A 05/05/2016   Procedure: ESOPHAGOGASTRODUODENOSCOPY (EGD) WITH PROPOFOL;  Surgeon: Clarene Essex, MD;  Location[facility] ENDOSCOPY;  Service: Endoscopy;  Laterality: N/A;  . GLAUCOMA SURGERY Bilateral 2000s   "laser"  . HOT HEMOSTASIS  08/09/2011   Procedure: HOT HEMOSTASIS (ARGON PLASMA COAGULATION/BICAP);  Surgeon: Jeryl Columbia, MD;  Location: [facility]OS[facility];  Service: Endoscopy;  Laterality: N/A;  . HOT HEMOSTASIS N/A 11/12/2013   Procedure: HOT HEMOSTASIS  (ARGON PLASMA COAGULATION/BICAP);  Surgeon: Jeryl Columbia, MD;  Location: [facility]OS[facility];  Service: Endoscopy;  Laterality: N/A;  . INSERT / REPLACE / REMOVE PACEMAKER  03/24/2015  . JOINT REPLACEMENT    . KNEE SURGERY Left done with 2nd left knee replacement   spacer bar placement  . LEFT HEART CATHETERIZATION WITH CORONARY ANGIOGRAM N/A 02/14/2012   Procedure: LEFT HEART CATHETERIZATION WITH CORONARY ANGIOGRAM;  Surgeon: Leonie Man, MD;  Location: [facility] CATH LAB;  Service: Cardiovascular;  Laterality: N/A;  . LEV DOPPLER  05/29/2012   RIGHT EXTREM. NORMAL VENOUS DUPLEX  . NM MYOCAR PERF WALL MOTION  01/26/2012; 11/2014   a. EF 79%,LV normal,ST SEGMENT CHANGE SUGGESTIVE OF ISCHEMIA.; LOW RISK, EF 60%. No  ischemia or infarction. No wall motion abnormality   . PACEMAKER PLACEMENT    . REVISION TOTAL KNEE ARTHROPLASTY Left   . TONSILLECTOMY    . TOTAL KNEE ARTHROPLASTY Bilateral   . TRIGGER FINGER RELEASE  11/22/2011   Procedure: RELEASE TRIGGER FINGER/A-1 PULLEY;  Surgeon: Meredith Pel, MD;  Location: [facility];  Service: Orthopedics;  Laterality: Left;  Left trigger thumb release  . TRIGGER FINGER RELEASE Right yrs ago    OB History    No data available       Home Medications    Prior to Admission medications   Medication Sig Start Date End Date Taking? Authorizing Provider  acetaminophen (TYLENOL) 500 MG tablet Take 500 mg by mouth every 6 (six) hours as needed for moderate pain.    Yes [provider]  albuterol (PROVENTIL HFA;VENTOLIN HFA) 108 (90 BASE) MCG/ACT inhaler Inhale 2 puffs into the lungs every 6 (six) hours as needed for wheezing.   Yes [provider]  aspirin-acetaminophen-caffeine (EXCEDRIN MIGRAINE) [phone] MG tablet Take 1 tablet by mouth every 6 (six) hours as needed for headache.   Yes [provider]  Biotin 1 MG CAPS Take 1 mg by mouth daily as needed (supplement).   Yes [provider]  busPIRone (BUSPAR) 10 MG tablet Take  1 tablet (10 mg total) by mouth every morning. 06/03/14  Yes Garvin Fila, MD  Cholecalciferol (VITAMIN D-3 PO) Take 2 tablets by mouth every morning.    Yes [provider]  clonazePAM (KLONOPIN) 0.5 MG tablet Take 0.5-1 mg by mouth 2 (two) times daily. Takes 2 tabs in am and 1 tab in pm   Yes [provider]  clopidogrel (PLAVIX) 75 MG tablet Take 75 mg by mouth every morning.   Yes [provider]  dicyclomine (BENTYL) 10 MG capsule Take 10 mg by mouth 2 (two) times daily.    Yes [provider]  esomeprazole (NEXIUM) 40 MG capsule Take 40 mg by mouth daily before breakfast.   Yes [provider]  furosemide (LASIX) 40 MG tablet Take 40 mg by mouth daily.    Yes [provider]  glipiZIDE (GLUCOTROL) 10 MG tablet Take 20 mg by mouth 2 (two) times daily before a meal.  05/03/14  Yes [provider]  insulin NPH (HUMULIN N,NOVOLIN N) 100 UNIT/ML injection Inject 0-20 Units into the skin See admin instructions. Novolin-N Give 20 unit in morning; give 10units at 4pm; evening dose as needed.  If less than 150 blood sugar= 0units.  If greater than 200 blood sugar= give 10units to decrease by 100 blood sugar   Yes [provider]  insulin regular (NOVOLIN R,HUMULIN R) 100 units/mL injection Inject 0-25 Units into the skin See admin instructions. Give 25 unit in morning; give 20units at 4pm; evening dose as needed.  If less than 150 blood sugar= 0units.  If greater than 200 blood sugar= give 10units to decrease by 100 blood sugar   Yes [provider]  ipratropium (ATROVENT) 0.03 % nasal spray Place 2 sprays into both nostrils every 12 (twelve) hours. Patient taking differently: Place 2 sprays into both nostrils every 12 (twelve) hours as needed for rhinitis.  10/14/16  Yes Patrecia Pour, MD  irbesartan (AVAPRO) 150 MG tablet Take 150 mg by mouth daily.   Yes [provider]  levothyroxine (SYNTHROID, LEVOTHROID) 100  MCG tablet Take 100 mcg by mouth every morning.    Yes [provider]  lidocaine (LIDODERM) 5 % Place 3 patches onto the skin daily as needed (pain). Remove & Discard patch within 12 hours or as directed by MD   Yes [provider]  LORazepam (ATIVAN) 0.5 MG tablet Take 0.5 mg by mouth daily as needed for anxiety.   Yes [provider]  magnesium 30 MG tablet Take 30 mg by mouth daily.   Yes [provider]  nortriptyline (PAMELOR) 50 MG capsule TAKE 2 CAPSULES (100MG ) AT BEDTIME 06/02/16  Yes Garvin Fila, MD  pantoprazole (PROTONIX) 40 MG tablet Take 40 mg by mouth daily. 10/07/16  Yes [provider]  Plecanatide (TRULANCE PO) Take 1 capsule by mouth daily as needed (constipation).    Yes [provider]  polyethylene glycol (MIRALAX / GLYCOLAX) packet Take 17 g by mouth every other day.    Yes [provider]  potassium chloride SA (K-DUR,KLOR-CON) 20 MEQ tablet Take 1 tablet (20 mEq total) by mouth 2 (two) times daily. Patient taking differently: Take 10 mEq by mouth 2 (two) times daily.  06/22/15  Yes Davonna Belling, MD  Sennosides (EX-LAX PO) Take 1 tablet by mouth daily as needed (constipation).    Yes [provider]  sodium phosphate (FLEET) enema Place 1 enema rectally once as needed (constipation). follow package directions    Yes [provider]  solifenacin (VESICARE) 10 MG tablet Take 10 mg by mouth daily as needed (overactive bladder).    Yes [provider]  terconazole (TERAZOL 7) 0.4 % vaginal cream Place 1 applicator vaginally at bedtime as needed (for yeast infection).  04/18/14  Yes [provider]  traMADol (ULTRAM) 50 MG tablet Take 1 tablet (50 mg total) by mouth every 12 (twelve) hours as needed for moderate pain. 03/27/15  Yes Bhagat, Bhavinkumar, PA  valsartan (DIOVAN) 160 MG tablet Take 160 mg by mouth daily. 08/02/16  Yes [provider]    Family  History Family History  Problem Relation Age of Onset  . Cancer Mother   . Cancer - Prostate Father     Social History Social History  Substance Use Topics  . Smoking status: Never Smoker  . Smokeless tobacco: Never Used  . Alcohol use No     Allergies   Iodine; Iohexol; Metoclopramide hcl; Sulfa antibiotics; Lactose intolerance (gi); and Lansoprazole   Review of Systems Review of Systems  Constitutional: Negative for chills and fever.  Respiratory: Negative for cough and shortness of breath.   Cardiovascular: Negative for chest pain.  Gastrointestinal: Negative for abdominal pain, diarrhea, nausea and vomiting.  Musculoskeletal: Positive for neck pain. Negative for back pain.  Skin: Negative for wound.  Neurological: Negative for dizziness, weakness, light-headedness, numbness and headaches.  All other systems reviewed and are negative.    Physical Exam Updated Vital Signs BP (!) 114/44   Pulse 78   Temp 98 F (36.7 C) (Oral)   Resp 12   Ht 5' 7.5" (1.715 m)   Wt 125.6 kg (277 lb)   SpO2 97%   BMI 42.74 kg/m   Physical Exam  Constitutional: She is oriented to person, place, and time. She appears well-developed and well-nourished. No distress.  HENT:  Head: Normocephalic. Head is without raccoon's eyes and without Battle's sign.  Right Ear: Tympanic membrane, external ear and ear canal normal.  Left Ear: Tympanic membrane, external ear and ear canal normal.  Nose: Nose normal.  Mouth/Throat: Uvula is midline and oropharynx is clear and moist.  Tenderness to left  occipital without noted swelling, hematoma, wound, instability, or crepitus.  Eyes: Pupils are equal, round, and reactive to light. Conjunctivae and EOM are normal.  Neck: Neck supple.  Cardiovascular: Normal rate, regular rhythm, normal heart sounds and intact distal pulses.   Pulmonary/Chest: Effort normal and breath sounds normal. No respiratory distress. She exhibits no tenderness.  Abdominal:  Soft. There is no tenderness. There is no guarding.  Morbidly obese  Musculoskeletal: She exhibits tenderness. She exhibits no edema.  Tender across the posterior neck bilaterally into the bilateral trapezius. No noted step-off, deformity, or swelling.  Lymphadenopathy:    She has no cervical adenopathy.  Neurological: She is alert and oriented to person, place, and time.  No noted sensory deficits.  No noted speech deficits. No aphasia. Patient handles oral secretions without difficulty. No noted swallowing defects.  Equal grip strength bilaterally. Strength 5/5 in the upper extremities. Strength 5/5 with flexion of the hips, knees, and ankles bilaterally.  No upright right ataxia.  Coordination intact. Cranial nerves III-XII grossly intact.  No facial droop.   Skin: Skin is warm and dry. Capillary refill takes less than 2 seconds. She is not diaphoretic.  Psychiatric: She has a normal mood and affect. Her behavior is normal.  Nursing note and vitals reviewed.    ED Treatments / Results  Labs (all labs ordered are listed, but only abnormal results are displayed) Labs Reviewed  BASIC METABOLIC PANEL - Abnormal; Notable for the following:       Result Value   Glucose, Bld 260 (*)    BUN 40 (*)    Creatinine, Ser 3.55 (*)    Calcium 8.8 (*)    GFR calc non Af Amer 11 (*)    GFR calc Af Amer 13 (*)    All other components within normal limits  CBC - Abnormal; Notable for the following:    RBC 3.61 (*)    Hemoglobin 11.0 (*)    HCT 34.4 (*)    All other components within normal limits  CBG MONITORING, ED - Abnormal; Notable for the following:    Glucose-Capillary 267 (*)    All other components within normal limits  URINALYSIS, ROUTINE W REFLEX MICROSCOPIC  TROPONIN I  CBG MONITORING, ED  I-STAT TROPONIN, ED    EKG  EKG Interpretation  Date/Time:  Sunday October 23 2016 13:21:33 EDT Ventricular Rate:  78 PR Interval:    QRS Duration: 123 QT Interval:  409 QTC  Calculation: 466 R Axis:   -6 Text Interpretation:  Sinus rhythm Prolonged PR interval Nonspecific intraventricular conduction delay No significant change since last tracing Confirmed by Addison Lank [phone]) on 10/23/2016 1:41:15 PM Also confirmed by Addison Lank [phone]), editor Laurena Spies [phone])  on 10/23/2016 2:14:08 PM       Radiology Dg Chest 2 View  Result Date: 10/23/2016 CLINICAL DATA:  SOB since fall this AM. No chest pain. Hx of pacemaker - 2016; CAD, COPD. Hx of HTN- on meds; hx of diabetes mellitus. EXAM: CHEST  2 VIEW COMPARISON:  10/10/2016 FINDINGS: Lateral view degraded by patient arm position. Moderate thoracic spondylosis. Pacer with leads at right atrium and right ventricle. No lead discontinuity. Midline trachea. Mild cardiomegaly. No pleural effusion or pneumothorax. No congestive failure. Clear lungs. IMPRESSION: Cardiomegaly, without acute disease. Electronically Signed   By: Abigail Miyamoto M.D.   On: 10/23/2016 15:52   Ct Head Wo Contrast  Result Date: 10/23/2016 CLINICAL DATA:  Fall after syncopal episode, striking the back of the  head. EXAM: CT HEAD WITHOUT CONTRAST CT CERVICAL SPINE WITHOUT CONTRAST TECHNIQUE: Multidetector CT imaging of the head and cervical spine was performed following the standard protocol without intravenous contrast. Multiplanar CT image reconstructions of the cervical spine were also generated. COMPARISON:  Multiple exams, including 06/26/2014 and 07/23/2012 FINDINGS: CT HEAD FINDINGS Brain: The brainstem, cerebellum, cerebral peduncles, thalami, basal ganglia, basilar cisterns, and ventricular system appear within normal limits. No intracranial hemorrhage, mass lesion, or acute CVA. Vascular: There is atherosclerotic calcification of the cavernous carotid arteries bilaterally. Skull: Mild hyperostosis frontalis interna. Incidental right occipital arachnoid granulation not appreciably changed from 04/19/2012. Sinuses/Orbits: Chronic right  maxillary sinusitis. Other: No supplemental non-categorized findings. CT CERVICAL SPINE FINDINGS Alignment: No vertebral subluxation is observed. Skull base and vertebrae: Fused left C3-4 facet joint. Degenerative spurring and loss of articular space at the anterior C1- 2 articulation. Ossicle along the posterior margin of the spinous process of C6, well corticated. No visible fracture. Soft tissues and spinal canal: No prevertebral soft tissue swelling. Disc levels: In the uncinate and facet spurring causes left foraminal impingement at C3-4, and borderline right foraminal impingement at C4-5 and C5-6. Upper chest: Unremarkable Other: No supplemental non-categorized findings. IMPRESSION: 1. No acute cervical spine findings. No acute intracranial findings. 2. Chronic right maxillary sinusitis. 3. Cervical spondylosis causing left foraminal impingement at C3-4. Electronically Signed   By: Van Clines M.D.   On: 10/23/2016 15:35   Ct Cervical Spine Wo Contrast  Result Date: 10/23/2016 CLINICAL DATA:  Fall after syncopal episode, striking the back of the head. EXAM: CT HEAD WITHOUT CONTRAST CT CERVICAL SPINE WITHOUT CONTRAST TECHNIQUE: Multidetector CT imaging of the head and cervical spine was performed following the standard protocol without intravenous contrast. Multiplanar CT image reconstructions of the cervical spine were also generated. COMPARISON:  Multiple exams, including 06/26/2014 and 07/23/2012 FINDINGS: CT HEAD FINDINGS Brain: The brainstem, cerebellum, cerebral peduncles, thalami, basal ganglia, basilar cisterns, and ventricular system appear within normal limits. No intracranial hemorrhage, mass lesion, or acute CVA. Vascular: There is atherosclerotic calcification of the cavernous carotid arteries bilaterally. Skull: Mild hyperostosis frontalis interna. Incidental right occipital arachnoid granulation not appreciably changed from 04/19/2012. Sinuses/Orbits: Chronic right maxillary sinusitis.  Other: No supplemental non-categorized findings. CT CERVICAL SPINE FINDINGS Alignment: No vertebral subluxation is observed. Skull base and vertebrae: Fused left C3-4 facet joint. Degenerative spurring and loss of articular space at the anterior C1- 2 articulation. Ossicle along the posterior margin of the spinous process of C6, well corticated. No visible fracture. Soft tissues and spinal canal: No prevertebral soft tissue swelling. Disc levels: In the uncinate and facet spurring causes left foraminal impingement at C3-4, and borderline right foraminal impingement at C4-5 and C5-6. Upper chest: Unremarkable Other: No supplemental non-categorized findings. IMPRESSION: 1. No acute cervical spine findings. No acute intracranial findings. 2. Chronic right maxillary sinusitis. 3. Cervical spondylosis causing left foraminal impingement at C3-4. Electronically Signed   By: Van Clines M.D.   On: 10/23/2016 15:35    Procedures Procedures (including critical care time)  Medications Ordered in ED Medications - No data to display   Initial Impression / Assessment and Plan / ED Course  I have reviewed the triage vital signs and the nursing notes.  Pertinent labs & imaging results that were available during my care of the patient were reviewed by me and considered in my medical decision making (see chart for details).     Patient presents for evaluation following a fall. Unknown loss of consciousness.  End of shift patient care handoff report given to Aldona Lento, MD, EM resident.  Plan: continue patient evaluation for fall/possible syncope.    Vitals:   10/23/16 1324 10/23/16 1325 10/23/16 1330 10/23/16 1430  BP:   (!) 114/44 126/69  Pulse: 78  78   Resp: (!) 24  12 18   Temp: 98 F (36.7 C)     TempSrc: Oral     SpO2: 100%  97%   Weight:  125.6 kg (277 lb)    Height:  5' 7.5" (1.715 m)       Final Clinical Impressions(s) / ED Diagnoses   Final diagnoses:  Fall, initial encounter      New Prescriptions New Prescriptions   No medications on file     Layla Maw 10/23/16 [place], [place] C, PA-C 10/23/16 [place], [place], MD 10/25/16 [phone]

## 2016-10-23 NOTE — ED Triage Notes (Signed)
Pt to ED from home via [facility] with c/o syncopal episode at home-- did c/o head pain for EMS, none now. Pt has been falling frequently at home per husband and pt. On arrival -- pt is a/o x 4, w/d, no obvious injury.

## 2016-10-23 NOTE — H&P (Addendum)
History and Physical    Tara Jackson JIR:[id number] DOB: 1940-09-18 DOA: 10/23/2016  Referring MD/NP/PA: Aldona Lento, MD (Resident) PCP: Tara Baton, MD  Patient coming from: Home via EMS  Chief Complaint: Syncope  HPI: Tara Jackson is a 76 y.o. 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 She had just last admitted in the hospital from 9/3 through 9/7 for chest pain for which she had a negative cardiac workup including  2 day stress testing. Since being home she has just been more weak. Normally the patient gets around in her home with a motorized scooter, but as of lately has been running into door jams with the scooter as though she has lost her coordination. Husband notes that there have been multiple occasions this past week in which the patient has almost fallen or fallen. Today, she had fallen and hit the back of her head. Patient thinks that she may have blacked out, but denies having any lightheadedness or palpitation prior to. Patient reports that she has been having headaches for which she is taking Aleve and Excedrin couple times this past week. Patient also pretreated herself with Azo for suspected urinary tract infection earlier in the week when she had some urinary frequency and vaginal itching. Reports those symptoms have now resolved. Other associated symptoms shortness of breath with exertion, memory loss, and body aches all over. She states that she does her own medications and has had no issues with  over taking or missing doses.  ED Course: Upon admission into the emergency department patient was seen to be afebrile, pulse 72-78, respirations 12-24, and all other vital signs maintained. Labs revealed BUN 40, creatinine 3.55, and glucose 260. Patient was given 1 L normal saline IV fluids in the  ED.  Review of Systems  Constitutional: Positive for malaise/fatigue. Negative for chills, fever and weight loss.  HENT: Negative for ear discharge and nosebleeds.   Eyes: Negative for double vision and photophobia.  Respiratory: Positive for shortness of breath. Negative for cough and hemoptysis.   Cardiovascular: Negative for chest pain, palpitations, orthopnea and leg swelling.  Gastrointestinal: Positive for constipation and diarrhea. Negative for abdominal pain, nausea and vomiting.  Genitourinary: Positive for dysuria and frequency.  Musculoskeletal: Positive for falls and myalgias.  Skin: Negative for itching and rash.  Neurological: Positive for loss of consciousness and weakness. Negative for focal weakness.  Endo/Heme/Allergies: Negative for environmental allergies and polydipsia.  Psychiatric/Behavioral: Positive for memory loss. Negative for substance abuse.    Past Medical History:  Diagnosis Date  . Anemia   . Anxiety   . Arthritis   . Asthma   . Brittle bone disease   . Cataract   . Chest pain    a. minimal CAD by cath in 2014 with 40% RI stenosis. b. low-risk NST in 11/2014.  Marland Kitchen Complication of anesthesia    hard to wake up after anesthesia, trouble turning head  . Depression   . Diabetes mellitus type 2, insulin dependent ([place])   . Diabetic neuropathy ([place])   . Diastolic dysfunction, left ventricle 11/30/14   EF 55%, grade 1 DD  . DVT (deep venous thrombosis) ([place])    "18 in RLE; 7 LLE prior to PE" (03/24/2015)  . Family history of adverse reaction to anesthesia    " the whole family is hard to  wake up"  . Glaucoma   . H/O hiatal hernia   . Hyperlipidemia   . Hypertension associated with diabetes ([place])   . Hypothyroidism   . Kidney stones    "passed them"  . Memory loss 06/03/2014  . Normal cardiac stress test 11/30/14   Negative Myoview  . On home oxygen therapy    "2L oxygen concentrator @ night"  . Osteoporosis   . Oxygen desaturation during sleep     wears 2 liters of oxygen at night   . Palpitations 35years ago   Cardionet monitor - revealed mostly normal sinus rhythm, sinus bradycardia with first-degree A-V block heart rates mostly in the 50s and 60s with some 70s. No arrhythmias, PVCs or PACs noted.  . PE (pulmonary thromboembolism) ([place]) x 3, last one was ~ 2003   history of recurrent RLE DVT with PE - last PE ~>13 yrs; Maintained on Plavix  . Pneumonia   . Presence of permanent cardiac pacemaker   . Restless legs syndrome   . Sleep apnea    cpap disontinued; test done 07/14/2008 ordered per  [place]rum  . Spinal headache   . Spinal stenosis    L 3, L 4 and L 5, and C 1, C 2 and C3  . Stroke [facility]) 04-19-2013   tia and stroke. Weakness Rt hand  . 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 s/p [employer] PPM in 03/2015 by Dr. Lovena Le secondary to sinus node dysfunction.   Marland Kitchen TIA (transient ischemic attack) March 2014    Past Surgical History:  Procedure Laterality Date  . ABDOMINAL HYSTERECTOMY    . APPENDECTOMY    . BACK SURGERY     steroid inj  . CARDIAC CATHETERIZATION  02/14/2012   no evidence of obstructive coronary disease ,low EDP with normal EF  . CATARACT EXTRACTION W/ INTRAOCULAR LENS  IMPLANT, BILATERAL Bilateral 2000s  . CHOLECYSTECTOMY    . DOPPLER ECHOCARDIOGRAPHY  2014; October 2016   a. EF 24-40%; normal diastolic pressures, no regional wall motion motion abnormalities;; b/ F 55-60%. Normal wall motion. GR 1 DD. No valve lesions  . EP IMPLANTABLE DEVICE N/A 03/24/2015   [facility] [employer] PPM, Dr. Lovena Le  . ESOPHAGOGASTRODUODENOSCOPY  08/09/2011   Procedure: ESOPHAGOGASTRODUODENOSCOPY (EGD);  Surgeon: Jeryl Columbia, MD;  Location: [facility] END[facility];  Service: Endoscopy;  Laterality: N/A;  . ESOPHAGOGASTRODUODENOSCOPY (EGD) WITH PROPOFOL N/A 11/12/2013   Procedure: ESOPHAGOGASTRODUODENOSCOPY (EGD) WITH PROPOFOL;  Surgeon: Jeryl Columbia, MD;  Location: [facility] ENDOSCOPY;  Service: Endoscopy;  Laterality: N/A;  .  ESOPHAGOGASTRODUODENOSCOPY (EGD) WITH PROPOFOL N/A 05/05/2016   Procedure: ESOPHAGOGASTRODUODENOSCOPY (EGD) WITH PROPOFOL;  Surgeon: Clarene Essex, MD;  Location: [facility] ENDOSCOPY;  Service: Endoscopy;  Laterality: N/A;  . GLAUCOMA SURGERY Bilateral 2000s   "laser"  . HOT HEMOSTASIS  08/09/2011   Procedure: HOT HEMOSTASIS (ARGON PLASMA COAGULATION/BICAP);  Surgeon: Jeryl Columbia, MD;  Location: [facility];  Service: Endoscopy;  Laterality: N/A;  . HOT HEMOSTASIS N/A 11/12/2013   Procedure: HOT HEMOSTASIS (ARGON PLASMA COAGULATION/BICAP);  Surgeon: Jeryl Columbia, MD;  Location: [facility];  Service: Endoscopy;  Laterality: N/A;  . INSERT / REPLACE / REMOVE PACEMAKER  03/24/2015  . JOINT REPLACEMENT    . KNEE SURGERY Left done with 2nd left knee replacement   spacer bar placement  . LEFT HEART CATHETERIZATION WITH CORONARY ANGIOGRAM N/A 02/14/2012   Procedure: LEFT HEART CATHETERIZATION WITH CORONARY ANGIOGRAM;  Surgeon: Leonie Man, MD;  Location: [facility] CATH LAB;  Service: Cardiovascular;  Laterality:  N/A;  . LEV DOPPLER  05/29/2012   RIGHT EXTREM. NORMAL VENOUS DUPLEX  . NM MYOCAR PERF WALL MOTION  01/26/2012; 11/2014   a. EF 79%,LV normal,ST SEGMENT CHANGE SUGGESTIVE OF ISCHEMIA.; LOW RISK, EF 60%. No ischemia or infarction. No wall motion abnormality   . PACEMAKER PLACEMENT    . REVISION TOTAL KNEE ARTHROPLASTY Left   . TONSILLECTOMY    . TOTAL KNEE ARTHROPLASTY Bilateral   . TRIGGER FINGER RELEASE  11/22/2011   Procedure: RELEASE TRIGGER FINGER/A-1 PULLEY;  Surgeon: Meredith Pel, MD;  Location: [facility];  Service: Orthopedics;  Laterality: Left;  Left trigger thumb release  . TRIGGER FINGER RELEASE Right yrs ago     reports that she has never smoked. She has never used smokeless tobacco. She reports that she does not drink alcohol or use drugs.  Allergies  Allergen Reactions  . Iodine Other (See Comments)    ONLY IV--Makes unconscious  . Iohexol Other (See Comments)     Code: SOB, Desc:  cardiac arrest w/ iv contrast, has never used 13 hr prep//alice calhoun, Onset Date: [id number]   . Metoclopramide Hcl Other (See Comments)    suicidal  . Sulfa Antibiotics Hives  . Lactose Intolerance (Gi) Diarrhea  . Lansoprazole Nausea And Vomiting    Family History  Problem Relation Age of Onset  . Cancer Mother   . Cancer - Prostate Father     Prior to Admission medications   Medication Sig Start Date End Date Taking? Authorizing Provider  acetaminophen (TYLENOL) 500 MG tablet Take 500 mg by mouth every 6 (six) hours as needed for moderate pain.    Yes [provider]  albuterol (PROVENTIL HFA;VENTOLIN HFA) 108 (90 BASE) MCG/ACT inhaler Inhale 2 puffs into the lungs every 6 (six) hours as needed for wheezing.   Yes [provider]  aspirin-acetaminophen-caffeine (EXCEDRIN MIGRAINE) [phone] MG tablet Take 1 tablet by mouth every 6 (six) hours as needed for headache.   Yes [provider]  Biotin 1 MG CAPS Take 1 mg by mouth daily as needed (supplement).   Yes [provider]  busPIRone (BUSPAR) 10 MG tablet Take 1 tablet (10 mg total) by mouth every morning. 06/03/14  Yes Garvin Fila, MD  Cholecalciferol (VITAMIN D-3 PO) Take 2 tablets by mouth every morning.    Yes [provider]  clonazePAM (KLONOPIN) 0.5 MG tablet Take 0.5-1 mg by mouth 2 (two) times daily. Takes 2 tabs in am and 1 tab in pm   Yes [provider]  clopidogrel (PLAVIX) 75 MG tablet Take 75 mg by mouth every morning.   Yes [provider]  dicyclomine (BENTYL) 10 MG capsule Take 10 mg by mouth 2 (two) times daily.    Yes [provider]  esomeprazole (NEXIUM) 40 MG capsule Take 40 mg by mouth daily before breakfast.   Yes [provider]  furosemide (LASIX) 40 MG tablet Take 40 mg by mouth daily.    Yes [provider]  glipiZIDE (GLUCOTROL) 10 MG tablet Take 20 mg by mouth 2 (two) times daily before a meal.  05/03/14  Yes  [provider]  insulin NPH (HUMULIN N,NOVOLIN N) 100 UNIT/ML injection Inject 0-20 Units into the skin See admin instructions. Novolin-N Give 20 unit in morning; give 10units at 4pm; evening dose as needed.  If less than 150 blood sugar= 0units.  If greater than 200 blood sugar= give 10units to decrease by 100 blood sugar   Yes  [provider]  insulin regular (NOVOLIN R,HUMULIN R) 100 units/mL injection Inject 0-25 Units into the skin See admin instructions. Give 25 unit in morning; give 20units at 4pm; evening dose as needed.  If less than 150 blood sugar= 0units.  If greater than 200 blood sugar= give 10units to decrease by 100 blood sugar   Yes [provider]  ipratropium (ATROVENT) 0.03 % nasal spray Place 2 sprays into both nostrils every 12 (twelve) hours. Patient taking differently: Place 2 sprays into both nostrils every 12 (twelve) hours as needed for rhinitis.  10/14/16  Yes Patrecia Pour, MD  irbesartan (AVAPRO) 150 MG tablet Take 150 mg by mouth daily.   Yes [provider]  levothyroxine (SYNTHROID, LEVOTHROID) 100 MCG tablet Take 100 mcg by mouth every morning.    Yes [provider]  lidocaine (LIDODERM) 5 % Place 3 patches onto the skin daily as needed (pain). Remove & Discard patch within 12 hours or as directed by MD   Yes [provider]  LORazepam (ATIVAN) 0.5 MG tablet Take 0.5 mg by mouth daily as needed for anxiety.   Yes [provider]  magnesium 30 MG tablet Take 30 mg by mouth daily.   Yes [provider]  nortriptyline (PAMELOR) 50 MG capsule TAKE 2 CAPSULES (100MG ) AT BEDTIME 06/02/16  Yes Garvin Fila, MD  pantoprazole (PROTONIX) 40 MG tablet Take 40 mg by mouth daily. 10/07/16  Yes [provider]  Plecanatide (TRULANCE PO) Take 1 capsule by mouth daily as needed (constipation).    Yes [provider]  polyethylene glycol (MIRALAX / GLYCOLAX) packet Take 17 g by mouth every other  day.    Yes [provider]  potassium chloride SA (K-DUR,KLOR-CON) 20 MEQ tablet Take 1 tablet (20 mEq total) by mouth 2 (two) times daily. Patient taking differently: Take 10 mEq by mouth 2 (two) times daily.  06/22/15  Yes Davonna Belling, MD  Sennosides (EX-LAX PO) Take 1 tablet by mouth daily as needed (constipation).    Yes [provider]  sodium phosphate (FLEET) enema Place 1 enema rectally once as needed (constipation). follow package directions    Yes [provider]  solifenacin (VESICARE) 10 MG tablet Take 10 mg by mouth daily as needed (overactive bladder).    Yes [provider]  terconazole (TERAZOL 7) 0.4 % vaginal cream Place 1 applicator vaginally at bedtime as needed (for yeast infection).  04/18/14  Yes [provider]  traMADol (ULTRAM) 50 MG tablet Take 1 tablet (50 mg total) by mouth every 12 (twelve) hours as needed for moderate pain. 03/27/15  Yes Bhagat, Bhavinkumar, PA  valsartan (DIOVAN) 160 MG tablet Take 160 mg by mouth daily. 08/02/16  Yes [provider]    Physical Exam:  Constitutional:Obese elderly in NAD, calm, comfortable Vitals:   10/23/16 1700 10/23/16 1730 10/23/16 1800 10/23/16 1830  BP: (!) 144/63 (!) 149/67 (!) 142/54 (!) 142/60  Pulse: 74 74 74 72  Resp: 17 13 12 19   Temp:      TempSrc:      SpO2: 100% 100% 100% 96%  Weight:      Height:       Eyes: PERRL, lids and conjunctivae normal ENMT: Mucous membranes are moist. Posterior pharynx clear of any exudate or lesions.  Neck: normal, supple, no masses, no thyromegaly Respiratory: clear to auscultation bilaterally, no wheezing, no crackles. Normal respiratory effort. No accessory muscle use.  Cardiovascular: Regular rate and rhythm,  no murmurs / rubs / gallops. No extremity edema. 2+ pedal pulses. No carotid bruits.  Abdomen: no tenderness, no masses palpated. No hepatosplenomegaly. Bowel sounds positive.  Musculoskeletal: no clubbing /  cyanosis. No joint deformity upper and lower extremities. Good ROM, no contractures. Normal muscle tone.  Skin: no rashes, lesions, ulcers. No induration Neurologic: CN 2-12 grossly intact. Sensation intact, DTR normal. Strength 5/5 in all 4.  Psychiatric: Normal judgment and insight. Alert and oriented x 3. Normal mood.     Labs on Admission: I have personally reviewed following labs and imaging studies  CBC:  Recent Labs Lab 10/23/16 1422  WBC 7.4  HGB 11.0*  HCT 34.4*  MCV 95.3  PLT 580   Basic Metabolic Panel:  Recent Labs Lab 10/23/16 1422  NA 136  K 4.0  CL 101  CO2 24  GLUCOSE 260*  BUN 40*  CREATININE 3.55*  CALCIUM 8.8*   GFR: Estimated Creatinine Clearance: 18.1 mL/min (A) (by C-G formula based on SCr of 3.55 mg/dL (H)). Liver Function Tests: No results for input(s): AST, ALT, ALKPHOS, BILITOT, PROT, ALBUMIN in the last 168 hours. No results for input(s): LIPASE, AMYLASE in the last 168 hours. No results for input(s): AMMONIA in the last 168 hours. Coagulation Profile: No results for input(s): INR, PROTIME in the last 168 hours. Cardiac Enzymes:  Recent Labs Lab 10/23/16 1422  TROPONINI <0.03   BNP (last 3 results) No results for input(s): PROBNP in the last 8760 hours. HbA1C: No results for input(s): HGBA1C in the last 72 hours. CBG:  Recent Labs Lab 10/23/16 1427  GLUCAP 267*   Lipid Profile: No results for input(s): CHOL, HDL, LDLCALC, TRIG, CHOLHDL, LDLDIRECT in the last 72 hours. Thyroid Function Tests: No results for input(s): TSH, T4TOTAL, FREET4, T3FREE, THYROIDAB in the last 72 hours. Anemia Panel: No results for input(s): VITAMINB12, FOLATE, FERRITIN, TIBC, IRON, RETICCTPCT in the last 72 hours. Urine analysis:    Component Value Date/Time   COLORURINE YELLOW 10/23/2016 [place] 10/23/2016 1528   LABSPEC 1.015 10/23/2016 1528   PHURINE 5.5 10/23/2016 1528   GLUCOSEU NEGATIVE 10/23/2016 1528   HGBUR NEGATIVE  10/23/2016 [place] 10/23/2016 [place] 10/23/2016 1528   PROTEINUR NEGATIVE 10/23/2016 1528   UROBILINOGEN 1.0 11/28/2014 1125   NITRITE NEGATIVE 10/23/2016 [place] 10/23/2016 1528   Sepsis Labs: No results found for this or any previous visit (from the past 240 hour(s)).   Radiological Exams on Admission: Dg Chest 2 View  Result Date: 10/23/2016 CLINICAL DATA:  SOB since fall this AM. No chest pain. Hx of pacemaker - 2016; CAD, COPD. Hx of HTN- on meds; hx of diabetes mellitus. EXAM: CHEST  2 VIEW COMPARISON:  10/10/2016 FINDINGS: Lateral view degraded by patient arm position. Moderate thoracic spondylosis. Pacer with leads at right atrium and right ventricle. No lead discontinuity. Midline trachea. Mild cardiomegaly. No pleural effusion or pneumothorax. No congestive failure. Clear lungs. IMPRESSION: Cardiomegaly, without acute disease. Electronically Signed   By: Abigail Miyamoto M.D.   On: 10/23/2016 15:52   Ct Head Wo Contrast  Result Date: 10/23/2016 CLINICAL DATA:  Fall after syncopal episode, striking the back of the head. EXAM: CT HEAD WITHOUT CONTRAST CT CERVICAL SPINE WITHOUT CONTRAST TECHNIQUE: Multidetector CT imaging of the head and cervical spine was performed following the standard protocol without intravenous contrast. Multiplanar CT image reconstructions of the cervical spine were also generated. COMPARISON:  Multiple exams,  including 06/26/2014 and 07/23/2012 FINDINGS: CT HEAD FINDINGS Brain: The brainstem, cerebellum, cerebral peduncles, thalami, basal ganglia, basilar cisterns, and ventricular system appear within normal limits. No intracranial hemorrhage, mass lesion, or acute CVA. Vascular: There is atherosclerotic calcification of the cavernous carotid arteries bilaterally. Skull: Mild hyperostosis frontalis interna. Incidental right occipital arachnoid granulation not appreciably changed from 04/19/2012. Sinuses/Orbits:  Chronic right maxillary sinusitis. Other: No supplemental non-categorized findings. CT CERVICAL SPINE FINDINGS Alignment: No vertebral subluxation is observed. Skull base and vertebrae: Fused left C3-4 facet joint. Degenerative spurring and loss of articular space at the anterior C1- 2 articulation. Ossicle along the posterior margin of the spinous process of C6, well corticated. No visible fracture. Soft tissues and spinal canal: No prevertebral soft tissue swelling. Disc levels: In the uncinate and facet spurring causes left foraminal impingement at C3-4, and borderline right foraminal impingement at C4-5 and C5-6. Upper chest: Unremarkable Other: No supplemental non-categorized findings. IMPRESSION: 1. No acute cervical spine findings. No acute intracranial findings. 2. Chronic right maxillary sinusitis. 3. Cervical spondylosis causing left foraminal impingement at C3-4. Electronically Signed   By: Van Clines M.D.   On: 10/23/2016 15:35   Ct Cervical Spine Wo Contrast  Result Date: 10/23/2016 CLINICAL DATA:  Fall after syncopal episode, striking the back of the head. EXAM: CT HEAD WITHOUT CONTRAST CT CERVICAL SPINE WITHOUT CONTRAST TECHNIQUE: Multidetector CT imaging of the head and cervical spine was performed following the standard protocol without intravenous contrast. Multiplanar CT image reconstructions of the cervical spine were also generated. COMPARISON:  Multiple exams, including 06/26/2014 and 07/23/2012 FINDINGS: CT HEAD FINDINGS Brain: The brainstem, cerebellum, cerebral peduncles, thalami, basal ganglia, basilar cisterns, and ventricular system appear within normal limits. No intracranial hemorrhage, mass lesion, or acute CVA. Vascular: There is atherosclerotic calcification of the cavernous carotid arteries bilaterally. Skull: Mild hyperostosis frontalis interna. Incidental right occipital arachnoid granulation not appreciably changed from 04/19/2012. Sinuses/Orbits: Chronic right  maxillary sinusitis. Other: No supplemental non-categorized findings. CT CERVICAL SPINE FINDINGS Alignment: No vertebral subluxation is observed. Skull base and vertebrae: Fused left C3-4 facet joint. Degenerative spurring and loss of articular space at the anterior C1- 2 articulation. Ossicle along the posterior margin of the spinous process of C6, well corticated. No visible fracture. Soft tissues and spinal canal: No prevertebral soft tissue swelling. Disc levels: In the uncinate and facet spurring causes left foraminal impingement at C3-4, and borderline right foraminal impingement at C4-5 and C5-6. Upper chest: Unremarkable Other: No supplemental non-categorized findings. IMPRESSION: 1. No acute cervical spine findings. No acute intracranial findings. 2. Chronic right maxillary sinusitis. 3. Cervical spondylosis causing left foraminal impingement at C3-4. Electronically Signed   By: Van Clines M.D.   On: 10/23/2016 15:35    EKG: Independently reviewed. Sinus rhythm with prolonged PR interval  Assessment/Plan Syncope: Acute. Patient found to have acute kidney injury with suspected in this setting symptoms secondary to hypovolemia. - Admit to a telemetry bed  - Check orthostatic vital signs in a.m  Acute renal failure along chronic kidney disease stage III: Patient presents acutely with a creatinine of 3.5 and BUN 40. Baseline creatinine previously noted to be around 1.6-1.7  - Gentle IV Fluids NS at 75 ml/hr - Check renal ultrasound - F/u urinalysis - Avoid nephrotoxic agents  Diastolic congestive heart failure: Chronic. Patient appears to be hypovolemic at this time last echocardiogram showed EF to be 65-70% on 10/12/2016. - strict ins and outs and daily weights  - Hold Lasix   Diabetes mellitus  type 2: Uncontrolled. Last hemoglobin A1c 7.6 on 10/12/2016 - Hypoglycemic protocols - Hold glipizide - CBGs every before meals and at bedtime with SSI  SSS s/p pacemaker : Status post  pacemaker by Dr. Lovena Le in February 2017. Pacemaker was interrogated in the ED and noted to be functioning properly. - Continue to monitor    Multiple falls:  Symptoms have been ongoing over the last 6 months but acutely worse the last week. Suspect an aspect of dehydration and deconditioning as likely cause. - Physical therapy to eval and treat   Anemia of chronic disease: Hemoglobin stable at baseline between 10-11 mg/dL. - Recheck CBC in a.m.  Essential hypertension - Continue Irbesartan  Hypothyroidism  - Continue levothyroxine  Depression/Anxiety/insomnia: - Continue nortriptyline, Buspar, Klonopin prn, Ativan prn  DVT/PE - Continue Plavix   Morbid obesity: BMI of 43.07  GERD/IBS-C: stable - Continue Protonix   DVT prophylaxis: heparin Code Status: Full  Family Communication: Discussed plan of care with patient family present at bedside  Disposition Plan: TBD  Consults called: none Admission status: Inpatient  Norval Morton MD [facility] Pager [phone]   If 7PM-7AM, please contact night-coverage [email or web address] Password [email or web address]  10/23/2016, 7:14 PM

## 2016-10-23 NOTE — ED Notes (Signed)
John from [place] informed us of pt interrogation results done upon arrival to ED. States no new changes or significant episodes of abnormality during patient's syncopal episode today. Fax X 2 has not gone through.

## 2016-10-23 NOTE — ED Provider Notes (Addendum)
Pt initially seen by PA. Handoff at shift cahnge. Pt seen, care assumed.  76y/o F with h/o SSS s/p PPm, prior PE off of coumadin 2/2 "hepatic effects". Recent hospitalization for CP with (-) V/Q, and Cardiac eval with Neg 2 day stress test.  Presents after multiple falls. Fell today. EKG without change. CT head and neck with out acute trauma. Labs show AKI with Cr. 3.55. Pt without h/o GI fluid losses, is on ARB.   Pt in no distress. No SOB at rest, but h/o SOB with exertion that she uses O2 "as needed".   Will discuss with Hospitalist re: AKI and Cr increase 1.5--3.55 under 2 weeks.   Tanna Furry, MD 10/23/16 Sandrea Hughs    Tanna Furry, MD 10/23/16 [phone]

## 2016-10-23 NOTE — ED Notes (Signed)
Pt ambulated to bedside commode with assistance. Steady.

## 2016-10-24 DIAGNOSIS — N179 Acute kidney failure, unspecified: Secondary | ICD-10-CM | POA: Diagnosis present

## 2016-10-24 DIAGNOSIS — N189 Chronic kidney disease, unspecified: Secondary | ICD-10-CM | POA: Diagnosis present

## 2016-10-24 LAB — CBC
HCT: 31 % — ABNORMAL LOW (ref 36.0–46.0)
Hemoglobin: 9.9 g/dL — ABNORMAL LOW (ref 12.0–15.0)
MCH: 30.3 pg (ref 26.0–34.0)
MCHC: 31.9 g/dL (ref 30.0–36.0)
MCV: 94.8 fL (ref 78.0–100.0)
Platelets: 230 10*3/uL (ref 150–400)
RBC: 3.27 MIL/uL — ABNORMAL LOW (ref 3.87–5.11)
RDW: 13.1 % (ref 11.5–15.5)
WBC: 6 10*3/uL (ref 4.0–10.5)

## 2016-10-24 LAB — BASIC METABOLIC PANEL
Anion gap: 10 (ref 5–15)
BUN: 38 mg/dL — ABNORMAL HIGH (ref 6–20)
CO2: 24 mmol/L (ref 22–32)
Calcium: 8.4 mg/dL — ABNORMAL LOW (ref 8.9–10.3)
Chloride: 105 mmol/L (ref 101–111)
Creatinine, Ser: 3.17 mg/dL — ABNORMAL HIGH (ref 0.44–1.00)
GFR calc Af Amer: 15 mL/min — ABNORMAL LOW (ref >60)
GFR calc non Af Amer: 13 mL/min — ABNORMAL LOW (ref >60)
Glucose, Bld: 264 mg/dL — ABNORMAL HIGH (ref 65–99)
Potassium: 3.2 mmol/L — ABNORMAL LOW (ref 3.5–5.1)
Sodium: 139 mmol/L (ref 135–145)

## 2016-10-24 LAB — GLUCOSE, CAPILLARY
Glucose-Capillary: 271 mg/dL — ABNORMAL HIGH (ref 65–99)
Glucose-Capillary: 287 mg/dL — ABNORMAL HIGH (ref 65–99)
Glucose-Capillary: 272 mg/dL — ABNORMAL HIGH (ref 65–99)
Glucose-Capillary: 116 mg/dL — ABNORMAL HIGH (ref 65–99)

## 2016-10-24 MED ORDER — IRBESARTAN 300 MG PO TABS: 150.0000 mg | ORAL_TABLET | Freq: Every day | ORAL | Status: DC

## 2016-10-24 MED ORDER — DICYCLOMINE HCL 10 MG PO CAPS
10.0000 mg | ORAL_CAPSULE | Freq: Two times a day (BID) | ORAL | Status: DC
  Administered 2016-10-24 – 2016-10-26 (×5): 10 mg via ORAL
  Filled 2016-10-24 (×5): qty 1

## 2016-10-24 MED ORDER — INSULIN NPH (HUMAN) (ISOPHANE) 100 UNIT/ML ~~LOC~~ SUSP
15.0000 [IU] | Freq: Two times a day (BID) | SUBCUTANEOUS | Status: DC
  Administered 2016-10-24 – 2016-10-26 (×3): 15 [IU] via SUBCUTANEOUS
  Filled 2016-10-24 (×2): qty 10

## 2016-10-24 MED ORDER — CLOPIDOGREL BISULFATE 75 MG PO TABS
75.0000 mg | ORAL_TABLET | Freq: Every morning | ORAL | Status: DC
  Administered 2016-10-24 – 2016-10-26 (×3): 75 mg via ORAL
  Filled 2016-10-24 (×3): qty 1

## 2016-10-24 NOTE — Progress Notes (Signed)
Inpatient Diabetes Program Recommendations  AACE/ADA: New Consensus Statement on Inpatient Glycemic Control (2015)  Target Ranges:  Prepandial:   less than 140 mg/dL      Peak postprandial:   less than 180 mg/dL (1-2 hours)      Critically ill patients:  140 - 180 mg/dL   Lab Results  Component Value Date   GLUCAP 287 (H) 10/24/2016   HGBA1C 7.6 (H) 10/12/2016    Review of Glycemic ControlResults for GHADEER, KASTELIC (MRN [id number]) as of 10/24/2016 12:24  Ref. Range 10/14/2016 16:00 10/23/2016 14:27 10/23/2016 20:56 10/24/2016 07:56 10/24/2016 11:58  Glucose-Capillary Latest Ref Range: 65 - 99 mg/dL 262 (H) 267 (H) 265 (H) 271 (H) 287 (H)    Diabetes history: Type 2 diabetes Outpatient Diabetes medications: Glucotrol 20 mg bid, NPH 0-20 units- 20 units in the morning and 10 units q PM Current orders for Inpatient glycemic control:  NPH 15 units bid, Novolog resistant tid with meals and HS  Inpatient Diabetes Program Recommendations:   Agree with the addition of NPH insulin.  Will follow.  Thanks Adah Perl, RN, BC-ADM Inpatient Diabetes Coordinator Pager [phone] (8a-5p)

## 2016-10-24 NOTE — Progress Notes (Signed)
Physical Therapy Evaluation Patient Details Name: Tara Jackson MRN: [id number] DOB: October 16, 1940 Today's Date: 10/24/2016   History of Present Illness  Patient is a 76 y/o female who was previously admitted 9/3-9/7 with chest pain and SOB. Cardiac markers and EKG had not shown anything acute; CXR- unremarkable. Pt was discharged home and returned 9/16 after a fall where patient hit head and experienced LOC after her LE "gave out".  PMH includes PE, stroke, HTN, HLD, DM, glaucoma, DVT, diastolic CHF, sinus node dysfunction s/p pacemaker placement  Clinical Impression  Patient admitted with above diagnosis. Patient expressed fear of falling throughout session and was apprehensive about standing without husband present, but after maximal encouragement patient's fear decreased and patient demonstrated compliance. Patient displayed global "shakiness" mid session and nurse was alerted. Deficits include: decreased tolerance to prolonged standing (secondary to pain in B lower thigh), decreased functional mobility, decreased confidence in balance, increased fear of falling. Patient's orthostatics were monitored throughout session and are as follows: 126/47 in supine, 127/47 in sitting, 128/59 in standing, and 138/68 at end of session as patient was seated in recliner. Patient only experienced symptoms of dizziness after performing a stand pivot transfer to bedside commode. Patient would like to return home with extra assistance and PT recommendation is that patient will receive Home Health with the stipulation that if patient does not progress after next session, higher level care, such as SNF will be considered.  Pt will benefit from PT to address above impairments. Will follow to maximize mobility.    Follow Up Recommendations Home health PT;Supervision/Assistance - 24 hour    Equipment Recommendations  None recommended by PT    Recommendations for Other Services       Precautions / Restrictions  Precautions Precautions: Fall Restrictions Weight Bearing Restrictions: No      Mobility  Bed Mobility Overal bed mobility: Needs Assistance Bed Mobility: Supine to Sit     Supine to sit: Min assist     General bed mobility comments: patient required min assistance bring hips forward once seated at EOB. Patient used handrail to assist with coming to a seated position.  Transfers Overall transfer level: Needs assistance Equipment used: Rolling walker (2 wheeled) Transfers: Sit to/from [employer] Sit to Stand: +2 physical assistance;Mod assist Stand pivot transfers: Mod assist       General transfer comment: Patient required mod assist to power to standing and to hold walker down so that it would not shift on her and to manage lines. Patient required cues for hand placement and use of body momentum. Stood from [employer], from low toilet x1 using bar. Patient unable to hold steady stance for 3 minutes for orthostatic measurement.  Ambulation/Gait Ambulation/Gait assistance: Min assist Ambulation Distance (Feet): 6 Feet Assistive device: Rolling walker (2 wheeled) Gait Pattern/deviations: Step-to pattern;Decreased step length - right;Decreased step length - left;Decreased stride length;Trunk flexed;Wide base of support Gait velocity: decreased Gait velocity interpretation: Below normal speed for age/gender General Gait Details: Slow gait and flexed trunk for increased balance. Increased weight bearing through B UE.  Stairs            Wheelchair Mobility    Modified Rankin (Stroke Patients Only)       Balance Overall balance assessment: Needs assistance Sitting-balance support: Feet supported;No upper extremity supported Sitting balance-Leahy Scale: Fair     Standing balance support: During functional activity;Single extremity supported Standing balance-Leahy Scale: Poor Standing balance comment: Requires BUE support in standing using RW.  Pertinent Vitals/Pain Pain Assessment: No/denies pain    Home Living Family/patient expects to be discharged to:: Private residence Living Arrangements: Spouse/significant other Available Help at Discharge: Family;Available 24 hours/day Type of Home: House Home Access: Level entry     Home Layout: One level Home Equipment: Walker - 2 wheels;Bedside commode;Shower seat;Wheelchair - Engineer, technical sales - power Additional Comments: Uses Hoverround at home    Prior Function Level of Independence: Needs assistance   Gait / Transfers Assistance Needed: Uses hoverround mostly; Furniture walking at home.  ADL's / Homemaking Assistance Needed: Assist with ADLs. Aide comes in twice/month. Spouse does IADLs.  Comments: Patient's glaucoma has been getting a little worse as per patients observation     Hand Dominance   Dominant Hand: Right    Extremity/Trunk Assessment   Upper Extremity Assessment Upper Extremity Assessment: Defer to OT evaluation    Lower Extremity Assessment Lower Extremity Assessment: Generalized weakness (Patient able to maintain standing for 1 minute.)    Cervical / Trunk Assessment Cervical / Trunk Assessment: Kyphotic (forward head posture, flexed trunk)  Communication   Communication: No difficulties  Cognition Arousal/Alertness: Awake/alert Behavior During Therapy: WFL for tasks assessed/performed Overall Cognitive Status: Within Functional Limits for tasks assessed                                        General Comments      Exercises     Assessment/Plan    PT Assessment Patient needs continued PT services  PT Problem List Decreased strength;Decreased range of motion;Decreased activity tolerance;Decreased balance;Decreased mobility;Decreased knowledge of use of DME;Decreased safety awareness;Decreased knowledge of precautions;Pain       PT Treatment Interventions Therapeutic activities;Gait  training;Therapeutic exercise;Patient/family education;Functional mobility training;DME instruction;Stair training;Neuromuscular re-education    PT Goals (Current goals can be found in the Care Plan section)  Acute Rehab PT Goals Patient Stated Goal: to go home PT Goal Formulation: With patient Time For Goal Achievement: 11/07/16 Potential to Achieve Goals: Good    Frequency Min 3X/week   Barriers to discharge        Co-evaluation               AM-PAC PT "6 Clicks" Daily Activity  Outcome Measure Difficulty turning over in bed (including adjusting bedclothes, sheets and blankets)?: A Little Difficulty moving from lying on back to sitting on the side of the bed? : Unable Difficulty sitting down on and standing up from a chair with arms (e.g., wheelchair, bedside commode, etc,.)?: Unable Help needed moving to and from a bed to chair (including a wheelchair)?: A Lot Help needed walking in hospital room?: A Lot Help needed climbing 3-5 steps with a railing? : Total 6 Click Score: 10    End of Session Equipment Utilized During Treatment: Gait belt Activity Tolerance: Patient limited by fatigue Patient left: in chair;with call bell/phone within reach;with chair alarm set;with nursing/sitter in room Nurse Communication: Mobility status PT Visit Diagnosis: Unsteadiness on feet (R26.81);Difficulty in walking, not elsewhere classified (R26.2)    Time: 2841-3244 PT Time Calculation (min) (ACUTE ONLY): 48 min   Charges:   PT Evaluation $PT Eval Moderate Complexity: 1 Mod PT Treatments $Therapeutic Activity: 23-37 mins   PT G Codes:        Jenene Kauffmann SPT  Josph Norfleet 10/24/2016, 1:50 PM

## 2016-10-24 NOTE — Progress Notes (Signed)
[facility]  Patient Status: Active (receiving services up to time of hospitalization)  [facility] is providing the following services: RN and PT  If patient discharges after hours, please call [phone].   Tara Jackson 10/24/2016, 12:34 PM

## 2016-10-24 NOTE — Plan of Care (Signed)
Problem: Acute Rehab PT Goals(only PT should resolve) Goal: Pt Will Transfer Bed To Chair/Chair To Bed And no reports of dizziness.

## 2016-10-24 NOTE — Progress Notes (Signed)
Chaplain visited with patient regarding Advanced Directives per consult.  Patient and spouse at bedside both believe she complete AD at [facility].  They would like some time to see if it has been placed on their chart here before completing another one.  Advanced Directive paperwork left with them as requested.    10/24/16 1125  Clinical Encounter Type  Visited With Patient and family together  Visit Type Initial;Psychological support;Spiritual support;Social support

## 2016-10-24 NOTE — Progress Notes (Signed)
[facility]                                                                              Patient Demographics  Tara Jackson, is a 76 y.o. female, DOB - November 28, 1940, YQM:[id number]  Admit date - 10/23/2016   Admitting Physician Clydie Braun, MD  Outpatient Primary MD for the patient is Creola Corn, MD  Outpatient specialists:   LOS - 1  days   Medical records reviewed and are as summarized below:    Chief Complaint  Patient presents with  . Loss of Consciousness       Brief summary    Tara Jackson is a 76 y.o. 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 Patient had a negative cardiac workup including 2 day stress testing, 2-D echo when she was admitted 9/3-9/7. Patient of complaint of being more weak since being home, recent falls on multiple occasions. Patient also reported headaches for which she was taking Aleve, Excedrin couple of times this past week. In ED, BUN 40, creatinine 3.55, patient was admitted for further workup   Assessment & Plan    Principal Problem:   Syncope: likely due to dehydration, acute kidney injury, hypovolemia  - currently , troponins 3, EKG with paced rhythm - patient had a negative cardiac workup including 2 day stress testing, 2-D echo when she was admitted 9/3-9/7. - PT evaluation  Active Problems:    Acute renal failure superimposed on chronic kidney disease (HCC) stage III - patient presented with Cr 3.5.UA showed no UTI - renal US showed no hydronephrosis - continue IV fluid hydration,hold Lasix, Avapro    Essential hypertension - BP currently soft, continue to hold Lasix, Avapro    Morbid obesity (HCC) - BMI 44.89, patient counseled on diet and weight control    Chronic diastolic heart failure (HCC) - currently euvolemic, hold  Lasix, continue IV fluid hydration, follow BMET - Monitor volume status closely    Diabetes mellitus type 2 in obese (HCC)  - restart NPH insulin 15 units twice a day, continue sliding scale insulin, - Obtain hemoglobin A1c, diabetic coordinator consult    Chronic anemia - Baseline hemoglobin 9-10 - Currently at baseline, follow H&H    Hypothyroidism Continue Synthroid 100 g daily   Code Status: full CODE STATUS DVT Prophylaxis:   heparin  Family Communication: Discussed in detail with the patient, all imaging results, lab results explained to the patient    Disposition Plan:   Time Spent in minutes 25 minutes  Procedures:  renal ultrasound  Consultants:     Antimicrobials:      Medications  Scheduled Meds: . busPIRone  10 mg Oral q morning - 10a  . clonazePAM  0.5 mg Oral QHS  . clonazePAM  1 mg Oral q morning - 10a  . clopidogrel  75 mg Oral q morning - 10a  . dicyclomine  10 mg Oral BID  . heparin  5,000 Units Subcutaneous Q8H  . insulin aspart  0-20 Units Subcutaneous TID WC  . insulin aspart  0-5 Units Subcutaneous QHS  . levothyroxine  100 mcg Oral q morning - 10a  . nortriptyline  100 mg Oral QHS  . pantoprazole  40 mg Oral Daily  . polyethylene glycol  17 g Oral QODAY   Continuous Infusions: . sodium chloride 125 mL/hr at 10/24/16 1024   PRN Meds:.acetaminophen **OR** acetaminophen, ipratropium, ipratropium-albuterol, lidocaine, LORazepam, ondansetron **OR** ondansetron (ZOFRAN) IV, traMADol   Antibiotics   Anti-infectives    None        Subjective:   Tara Jackson was seen and examined today.  Denies any specific complaints, BP still borderline low. No fevers or chills. Generalized weakness+.  Patient denies dizziness, chest pain, shortness of breath, abdominal pain, N/V/D/C, numbess, tingling. No acute events overnight.    Objective:   Vitals:   10/23/16 2100 10/23/16 2128 10/24/16 0500 10/24/16 0528  BP:  (!) 108/56  (!) 108/52    Pulse: 74   65  Resp: 20   18  Temp: 98.4 F (36.9 C)   98.2 F (36.8 C)  TempSrc: Oral   Oral  SpO2: 100%   99%  Weight:  128.5 kg (283 lb 3.2 oz) 130 kg (286 lb 11.2 oz)   Height:  5\' 7"  (1.702 m)      Intake/Output Summary (Last 24 hours) at 10/24/16 1124 Last data filed at 10/24/16 0753  Gross per 24 hour  Intake            327.5 ml  Output              500 ml  Net           -172.5 ml     Wt Readings from Last 3 Encounters:  10/24/16 130 kg (286 lb 11.2 oz)  10/14/16 125.6 kg (277 lb)  05/05/16 127.7 kg (281 lb 9.6 oz)     Exam  General: Alert and oriented x 3, NAD  Eyes: PERRLA, EOMI, Anicteric Sclera,  HEENT:  Atraumatic, normocephalic  Cardiovascular: S1 S2 auscultated, no rubs, murmurs or gallops. Regular rate and rhythm.  Respiratory: Clear to auscultation bilaterally, no wheezing, rales or rhonchi  Gastrointestinal: Soft, nontender, nondistended, + bowel sounds  Ext: no pedal edema bilaterally  Neuro: AAOx3, Cr N's II- XII. Strength 5/5 upper and lower extremities bilaterally, speech clear  Musculoskeletal: No digital cyanosis, clubbing  Skin: No rashes  Psych: Normal affect and demeanor, alert and oriented x3    Data Reviewed:  I have personally reviewed following labs and imaging studies  Micro Results No results found for this or any previous visit (from the past 240 hour(s)).  Radiology Reports Dg Chest 2 View  Result Date: 10/23/2016 CLINICAL DATA:  SOB since fall this AM. No chest pain. Hx of pacemaker - 2016; CAD, COPD. Hx of HTN- on meds; hx of diabetes mellitus. EXAM: CHEST  2 VIEW COMPARISON:  10/10/2016 FINDINGS: Lateral view degraded by patient arm position. Moderate thoracic spondylosis. Pacer with leads at right atrium and right ventricle. No lead discontinuity. Midline trachea. Mild cardiomegaly. No pleural effusion or pneumothorax. No congestive failure. Clear lungs. IMPRESSION: Cardiomegaly, without acute disease. Electronically  Signed   By: Jeronimo Greaves M.D.   On: 10/23/2016 15:52   Dg Chest 2 View  Result Date: 10/10/2016 CLINICAL DATA:  Chest pain and shortness of breath for 1 day. EXAM: CHEST  2 VIEW COMPARISON:  Radiograph 04/21/2016 FINDINGS: Left-sided pacemaker remains in place. The cardiomediastinal contours  are unchanged. The lungs are clear. Pulmonary vasculature is normal. No consolidation, pleural effusion, or pneumothorax. No acute osseous abnormalities are seen. IMPRESSION: No acute abnormality. Electronically Signed   By: Rubye Oaks M.D.   On: 10/10/2016 21:17   Ct Head Wo Contrast  Result Date: 10/23/2016 CLINICAL DATA:  Fall after syncopal episode, striking the back of the head. EXAM: CT HEAD WITHOUT CONTRAST CT CERVICAL SPINE WITHOUT CONTRAST TECHNIQUE: Multidetector CT imaging of the head and cervical spine was performed following the standard protocol without intravenous contrast. Multiplanar CT image reconstructions of the cervical spine were also generated. COMPARISON:  Multiple exams, including 06/26/2014 and 07/23/2012 FINDINGS: CT HEAD FINDINGS Brain: The brainstem, cerebellum, cerebral peduncles, thalami, basal ganglia, basilar cisterns, and ventricular system appear within normal limits. No intracranial hemorrhage, mass lesion, or acute CVA. Vascular: There is atherosclerotic calcification of the cavernous carotid arteries bilaterally. Skull: Mild hyperostosis frontalis interna. Incidental right occipital arachnoid granulation not appreciably changed from 04/19/2012. Sinuses/Orbits: Chronic right maxillary sinusitis. Other: No supplemental non-categorized findings. CT CERVICAL SPINE FINDINGS Alignment: No vertebral subluxation is observed. Skull base and vertebrae: Fused left C3-4 facet joint. Degenerative spurring and loss of articular space at the anterior C1- 2 articulation. Ossicle along the posterior margin of the spinous process of C6, well corticated. No visible fracture. Soft tissues and  spinal canal: No prevertebral soft tissue swelling. Disc levels: In the uncinate and facet spurring causes left foraminal impingement at C3-4, and borderline right foraminal impingement at C4-5 and C5-6. Upper chest: Unremarkable Other: No supplemental non-categorized findings. IMPRESSION: 1. No acute cervical spine findings. No acute intracranial findings. 2. Chronic right maxillary sinusitis. 3. Cervical spondylosis causing left foraminal impingement at C3-4. Electronically Signed   By: Gaylyn Rong M.D.   On: 10/23/2016 15:35   Ct Cervical Spine Wo Contrast  Result Date: 10/23/2016 CLINICAL DATA:  Fall after syncopal episode, striking the back of the head. EXAM: CT HEAD WITHOUT CONTRAST CT CERVICAL SPINE WITHOUT CONTRAST TECHNIQUE: Multidetector CT imaging of the head and cervical spine was performed following the standard protocol without intravenous contrast. Multiplanar CT image reconstructions of the cervical spine were also generated. COMPARISON:  Multiple exams, including 06/26/2014 and 07/23/2012 FINDINGS: CT HEAD FINDINGS Brain: The brainstem, cerebellum, cerebral peduncles, thalami, basal ganglia, basilar cisterns, and ventricular system appear within normal limits. No intracranial hemorrhage, mass lesion, or acute CVA. Vascular: There is atherosclerotic calcification of the cavernous carotid arteries bilaterally. Skull: Mild hyperostosis frontalis interna. Incidental right occipital arachnoid granulation not appreciably changed from 04/19/2012. Sinuses/Orbits: Chronic right maxillary sinusitis. Other: No supplemental non-categorized findings. CT CERVICAL SPINE FINDINGS Alignment: No vertebral subluxation is observed. Skull base and vertebrae: Fused left C3-4 facet joint. Degenerative spurring and loss of articular space at the anterior C1- 2 articulation. Ossicle along the posterior margin of the spinous process of C6, well corticated. No visible fracture. Soft tissues and spinal canal: No  prevertebral soft tissue swelling. Disc levels: In the uncinate and facet spurring causes left foraminal impingement at C3-4, and borderline right foraminal impingement at C4-5 and C5-6. Upper chest: Unremarkable Other: No supplemental non-categorized findings. IMPRESSION: 1. No acute cervical spine findings. No acute intracranial findings. 2. Chronic right maxillary sinusitis. 3. Cervical spondylosis causing left foraminal impingement at C3-4. Electronically Signed   By: Gaylyn Rong M.D.   On: 10/23/2016 15:35   US Renal  Result Date: 10/23/2016 CLINICAL DATA:  Acute renal failure. Increasing creatinine over the past 2 weeks. Fell this morning. History of hypertension  and diabetes. Stage 3 chronic kidney disease. EXAM: RENAL / URINARY TRACT ULTRASOUND COMPLETE COMPARISON:  Ultrasound kidneys 04/20/2014. CT abdomen and pelvis 08/05/2013 FINDINGS: Right Kidney: Length: 10.4 cm. Diffuse parenchymal thinning with increased parenchymal echotexture suggesting chronic medical renal disease. No hydronephrosis or solid mass identified. Left Kidney: Length: 11.3 cm. Diffuse parenchymal thinning with increased parenchymal echotexture suggesting chronic medical renal disease. No hydronephrosis or solid mass identified. Bladder: Bladder is incompletely distended without evidence of wall thickening or filling defect. IMPRESSION: Bilateral parenchymal thinning and increased echotexture suggesting chronic medical renal disease. No hydronephrosis. Electronically Signed   By: Burman Nieves M.D.   On: 10/23/2016 23:58   Nm Myocar Multi W/spect W/wall Motion / Ef  Result Date: 10/14/2016  There was no ST segment deviation noted during stress.  No T wave inversion was noted during stress.  There is a very small mild intensity defect present in the apical inferior and apical lateral location that is fixed and likely represents diaphragmatic and large breast attenuation. No ischemia noted.  This is a low risk study.   The left ventricular ejection fraction is normal (55-65%).  Nuclear stress EF: 61%.  The study is normal.    Nm Pulmonary Vent And Perf (v/q Scan)  Result Date: 10/11/2016 CLINICAL DATA:  Shortness of Breath EXAM: NUCLEAR MEDICINE VENTILATION - PERFUSION LUNG SCAN TECHNIQUE: Ventilation images were obtained in multiple projections using inhaled aerosol Tc-45m DTPA. Perfusion images were obtained in multiple projections after intravenous injection of Tc-1m MAA. RADIOPHARMACEUTICALS:  31.7 mCi Technetium-20m DTPA aerosol inhalation and 4.3 mCi Technetium-36m MAA IV COMPARISON:  Chest x-ray 10/10/2016 FINDINGS: Ventilation: No focal ventilation defect. Perfusion: No wedge shaped peripheral perfusion defects to suggest acute pulmonary embolism. IMPRESSION: No evidence of pulmonary embolus. Electronically Signed   By: Charlett Nose M.D.   On: 10/11/2016 10:48    Lab Data:  CBC:  Recent Labs Lab 10/23/16 1422 10/24/16 0424  WBC 7.4 6.0  HGB 11.0* 9.9*  HCT 34.4* 31.0*  MCV 95.3 94.8  PLT 238 230   Basic Metabolic Panel:  Recent Labs Lab 10/23/16 1422 10/24/16 0424  NA 136 139  K 4.0 3.2*  CL 101 105  CO2 24 24  GLUCOSE 260* 264*  BUN 40* 38*  CREATININE 3.55* 3.17*  CALCIUM 8.8* 8.4*   GFR: Estimated Creatinine Clearance: 20.5 mL/min (A) (by C-G formula based on SCr of 3.17 mg/dL (H)). Liver Function Tests: No results for input(s): AST, ALT, ALKPHOS, BILITOT, PROT, ALBUMIN in the last 168 hours. No results for input(s): LIPASE, AMYLASE in the last 168 hours. No results for input(s): AMMONIA in the last 168 hours. Coagulation Profile: No results for input(s): INR, PROTIME in the last 168 hours. Cardiac Enzymes:  Recent Labs Lab 10/23/16 1422  TROPONINI <0.03   BNP (last 3 results) No results for input(s): PROBNP in the last 8760 hours. HbA1C: No results for input(s): HGBA1C in the last 72 hours. CBG:  Recent Labs Lab 10/23/16 1427 10/23/16 2056 10/24/16 0756    GLUCAP 267* 265* 271*   Lipid Profile: No results for input(s): CHOL, HDL, LDLCALC, TRIG, CHOLHDL, LDLDIRECT in the last 72 hours. Thyroid Function Tests: No results for input(s): TSH, T4TOTAL, FREET4, T3FREE, THYROIDAB in the last 72 hours. Anemia Panel: No results for input(s): VITAMINB12, FOLATE, FERRITIN, TIBC, IRON, RETICCTPCT in the last 72 hours. Urine analysis:    Component Value Date/Time   COLORURINE YELLOW 10/23/2016 1528   APPEARANCEUR CLEAR 10/23/2016 1528   LABSPEC 1.015 10/23/2016  1528   PHURINE 5.5 10/23/2016 1528   GLUCOSEU NEGATIVE 10/23/2016 1528   HGBUR NEGATIVE 10/23/2016 1528   BILIRUBINUR NEGATIVE 10/23/2016 1528   KETONESUR NEGATIVE 10/23/2016 1528   PROTEINUR NEGATIVE 10/23/2016 1528   UROBILINOGEN 1.0 11/28/2014 1125   NITRITE NEGATIVE 10/23/2016 1528   LEUKOCYTESUR NEGATIVE 10/23/2016 1528     Tara Jackson M.D. [facility] 10/24/2016, 11:24 AM  Pager: [phone] Between 7am to 7pm - call Pager - [phone]  After 7pm go to [email or web address] - password [email or web address]  Call night coverage person covering after 7pm

## 2016-10-25 LAB — BASIC METABOLIC PANEL
Anion gap: 8 (ref 5–15)
BUN: 32 mg/dL — ABNORMAL HIGH (ref 6–20)
CO2: 23 mmol/L (ref 22–32)
Calcium: 7.9 mg/dL — ABNORMAL LOW (ref 8.9–10.3)
Chloride: 109 mmol/L (ref 101–111)
Creatinine, Ser: 2.94 mg/dL — ABNORMAL HIGH (ref 0.44–1.00)
GFR calc Af Amer: 17 mL/min — ABNORMAL LOW (ref >60)
GFR calc non Af Amer: 14 mL/min — ABNORMAL LOW (ref >60)
Glucose, Bld: 138 mg/dL — ABNORMAL HIGH (ref 65–99)
Potassium: 3.2 mmol/L — ABNORMAL LOW (ref 3.5–5.1)
Sodium: 140 mmol/L (ref 135–145)

## 2016-10-25 LAB — GLUCOSE, CAPILLARY
Glucose-Capillary: 138 mg/dL — ABNORMAL HIGH (ref 65–99)
Glucose-Capillary: 215 mg/dL — ABNORMAL HIGH (ref 65–99)
Glucose-Capillary: 160 mg/dL — ABNORMAL HIGH (ref 65–99)
Glucose-Capillary: 192 mg/dL — ABNORMAL HIGH (ref 65–99)

## 2016-10-25 MED ORDER — POTASSIUM CHLORIDE CRYS ER 20 MEQ PO TBCR
40.0000 meq | EXTENDED_RELEASE_TABLET | Freq: Once | ORAL | Status: AC
  Administered 2016-10-25: 40 meq via ORAL
  Filled 2016-10-25: qty 2

## 2016-10-25 NOTE — Progress Notes (Signed)
Physical Therapy Treatment Patient Details Name: Tara Jackson MRN: [id number] DOB: [birth year] Today's Date: 10/25/2016    History of Present Illness Patient is a 76 y/o female who was previously admitted 9/3-9/7 with chest pain and SOB. Cardiac markers and EKG had not shown anything acute; CXR- unremarkable. Pt was discharged home and returned 9/16 after a fall where patient hit head and experienced LOC after her LE "gave out".  PMH includes PE, stroke, HTN, HLD, DM, glaucoma, DVT, diastolic CHF, sinus node dysfunction s/p pacemaker placement    PT Comments    Patient progressing towards goals. Patient demonstrated significant improvement from eval and was determined to participate in therapy and maximize mobility and ambulation. Patient ambulated a total of approx. 200 feet with 1 rest break in between. Patient was min guard with transfers and ambulation, requiring min assist for management of IV pole. Patient experienced SOB and fatigue during ambulation, limiting functional mobility. However, patients O2 sats were 93% after first bout of ambulation, while seated in recliner, and 95% at end of session with patient seated in recliner. Patient also experienced "shakiness" starting in R UE midway through session. PT will follow to maximize mobility.   Follow Up Recommendations  Home health PT;Supervision/Assistance - 24 hour     Equipment Recommendations  None recommended by PT    Recommendations for Other Services       Precautions / Restrictions Precautions Precautions: Fall Restrictions Weight Bearing Restrictions: No    Mobility  Bed Mobility               General bed mobility comments: Patient was seated at EOB upon arrival  Transfers Overall transfer level: Modified independent Equipment used: Rolling walker (2 wheeled) Transfers: Sit to/from Stand Sit to Stand: Min guard         General transfer comment: Patient required min guard to ensure safety in  transfers. Patient required cuing to place hands behind and ensure chair was close enough before sitting. Patient performed sit to stand x2, once from EOB and once from chair.  Ambulation/Gait Ambulation/Gait assistance: Min guard;Min assist Ambulation Distance (Feet): 100 Feet (x2 (rest break in between)) Assistive device: Rolling walker (2 wheeled) Gait Pattern/deviations: Step-through pattern;Decreased step length - right;Decreased step length - left;Decreased stride length;Trunk flexed Gait velocity: decreased Gait velocity interpretation: Below normal speed for age/gender General Gait Details: Patient ambulated with slow gait, was verbally cued to improve posture and stand more erect. Patient also verbally cued to keep walker close when ambulating and to employ pursed lip breathing due to SOB when walking. Min assist provided for IV pole managed by PT.   Stairs            Wheelchair Mobility    Modified Rankin (Stroke Patients Only)       Balance Overall balance assessment: Needs assistance Sitting-balance support: Feet supported;No upper extremity supported Sitting balance-Leahy Scale: Good     Standing balance support: Bilateral upper extremity supported Standing balance-Leahy Scale: Fair Standing balance comment: Requires BUE support in standing using RW.                            Cognition Arousal/Alertness: Awake/alert Behavior During Therapy: WFL for tasks assessed/performed Overall Cognitive Status: Within Functional Limits for tasks assessed  General Comments: Patient was determined to do as much as possible during session.      Exercises      General Comments        Pertinent Vitals/Pain Pain Assessment: Faces Faces Pain Scale: Hurts little more Pain Location: back; Lower R Pain Descriptors / Indicators: Constant Pain Intervention(s): Limited activity within patient's tolerance;Monitored  during session (Patient informed physician and nurse)    Home Living                      Prior Function            PT Goals (current goals can now be found in the care plan section) Acute Rehab PT Goals Patient Stated Goal: to go home PT Goal Formulation: With patient Time For Goal Achievement: 11/07/16 Potential to Achieve Goals: Good Progress towards PT goals: Progressing toward goals    Frequency    Min 3X/week      PT Plan Current plan remains appropriate    Co-evaluation              AM-PAC PT "6 Clicks" Daily Activity  Outcome Measure  Difficulty turning over in bed (including adjusting bedclothes, sheets and blankets)?: A Little Difficulty moving from lying on back to sitting on the side of the bed? : A Little Difficulty sitting down on and standing up from a chair with arms (e.g., wheelchair, bedside commode, etc,.)?: A Little Help needed moving to and from a bed to chair (including a wheelchair)?: A Little Help needed walking in hospital room?: A Little Help needed climbing 3-5 steps with a railing? : A Lot 6 Click Score: 17    End of Session Equipment Utilized During Treatment: Gait belt Activity Tolerance: Patient limited by fatigue Patient left: in chair;with call bell/phone within reach;with chair alarm set Nurse Communication: Mobility status PT Visit Diagnosis: Unsteadiness on feet (R26.81);Difficulty in walking, not elsewhere classified (R26.2)     Time: [id number] PT Time Calculation (min) (ACUTE ONLY): 26 min  Charges:  $Gait Training: 23-37 mins                    G Codes:       Annesha Delgreco SPT   Ellenore Roscoe 10/25/2016, 11:04 AM

## 2016-10-25 NOTE — Progress Notes (Signed)
[facility]                                                                              Patient Demographics  Tara Jackson, is a 76 y.o. female, DOB - 09/20/1940, ZOX:[id number]  Admit date - 10/23/2016   Admitting Physician Clydie Braun, MD  Outpatient Primary MD for the patient is Creola Corn, MD  Outpatient specialists:   LOS - 2  days   Medical records reviewed and are as summarized below:    Chief Complaint  Patient presents with  . Loss of Consciousness       Brief summary    Tara Jackson is a 76 y.o. 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 Patient had a negative cardiac workup including 2 day stress testing, 2-D echo when she was admitted 9/3-9/7. Patient of complaint of being more weak since being home, recent falls on multiple occasions. Patient also reported headaches for which she was taking Aleve, Excedrin couple of times this past week. In ED, BUN 40, creatinine 3.55, patient was admitted for further workup   Assessment & Plan    Principal Problem:   Syncope: likely due to dehydration, acute kidney injury, hypovolemia  - currently , troponins 3, EKG with paced rhythm - patient had a negative cardiac workup including 2 day stress testing, 2-D echo when she was admitted 9/3-9/7. - PT evaluation : recommended home health PT or supervision 24 hours  Active Problems:    Acute renal failure superimposed on chronic kidney disease (HCC) stage III - patient presented with Cr 3.5.UA showed no UTI - renal US showed no hydronephrosis - Creatinine improving 2.9 today, baseline 1.6-1.7 - Continue IV fluid hydration, continue to hold Lasix, Avapro    Essential hypertension -BP improving and stable, continue to hold Lasix, Avapro    Morbid obesity (HCC) - BMI 44.89,  patient counseled on diet and weight control    Chronic diastolic heart failure (HCC) - currently euvolemic, hold Lasix - Negative balance of 202 mL, continue gentle hydration - Monitor volume status closely    Diabetes mellitus type 2 in obese (HCC)  - Restarted NPH insulin 15 units twice a day, sliding scale insulin.  - Hemoglobin A1c 7.6    Chronic anemia - Baseline hemoglobin 9-10 - Currently at baseline    Hypothyroidism Continue Synthroid 100 g daily  Hypokalemia Replaced   Code Status: full CODE STATUS DVT Prophylaxis:   heparin  Family Communication: Discussed in detail with the patient, all imaging results, lab results explained to the patient    Disposition Plan: Hopefully DC home tomorrow if creatinine improving  Time Spent in minutes 25 minutes  Procedures:  renal ultrasound  Consultants:     Antimicrobials:      Medications  Scheduled Meds: . busPIRone  10 mg Oral q morning - 10a  . clonazePAM  0.5 mg Oral QHS  . clonazePAM  1 mg Oral q morning - 10a  . clopidogrel  75  mg Oral q morning - 10a  . dicyclomine  10 mg Oral BID  . heparin  5,000 Units Subcutaneous Q8H  . insulin aspart  0-20 Units Subcutaneous TID WC  . insulin aspart  0-5 Units Subcutaneous QHS  . insulin NPH Human  15 Units Subcutaneous BID AC & HS  . levothyroxine  100 mcg Oral q morning - 10a  . nortriptyline  100 mg Oral QHS  . pantoprazole  40 mg Oral Daily  . polyethylene glycol  17 g Oral QODAY   Continuous Infusions: . sodium chloride 100 mL/hr at 10/25/16 0640   PRN Meds:.acetaminophen **OR** acetaminophen, ipratropium, ipratropium-albuterol, lidocaine, LORazepam, ondansetron **OR** ondansetron (ZOFRAN) IV, traMADol   Antibiotics   Anti-infectives    None        Subjective:   Hawanya Hecksel was seen and examined today.  Feeling somewhat better however still feels weak. Denies any pain. BP improving.   Patient denies dizziness, chest pain, shortness of breath,  abdominal pain, N/V/D/C, numbess, tingling. No acute events overnight.    Objective:   Vitals:   10/24/16 1535 10/24/16 2143 10/25/16 0500 10/25/16 0519  BP: (!) 137/46 (!) 128/43  (!) 129/45  Pulse: 62 61  (!) 58  Resp: 16 20  20   Temp: 97.7 F (36.5 C) 97.6 F (36.4 C)  98 F (36.7 C)  TempSrc: Oral Oral  Oral  SpO2: 100% 100%  100%  Weight:   134.4 kg (296 lb 3.2 oz)   Height:        Intake/Output Summary (Last 24 hours) at 10/25/16 1423 Last data filed at 10/25/16 1146  Gross per 24 hour  Intake              320 ml  Output              350 ml  Net              -30 ml     Wt Readings from Last 3 Encounters:  10/25/16 134.4 kg (296 lb 3.2 oz)  10/14/16 125.6 kg (277 lb)  05/05/16 127.7 kg (281 lb 9.6 oz)     Exam   General: Alert and oriented x 3, NAD  Eyes:   HEENT:    Cardiovascular: S1 S2 clear, RRR  Respiratory: CTAB  Gastrointestinal: Soft, NT, ND, + bowel sounds  Ext: no pedal edema bilaterally  Neuro: no new deficits  Musculoskeletal: No digital cyanosis, clubbing  Skin: No rashes  Psych: Normal affect and demeanor, alert and oriented x3    Data Reviewed:  I have personally reviewed following labs and imaging studies  Micro Results No results found for this or any previous visit (from the past 240 hour(s)).  Radiology Reports Dg Chest 2 View  Result Date: 10/23/2016 CLINICAL DATA:  SOB since fall this AM. No chest pain. Hx of pacemaker - 2016; CAD, COPD. Hx of HTN- on meds; hx of diabetes mellitus. EXAM: CHEST  2 VIEW COMPARISON:  10/10/2016 FINDINGS: Lateral view degraded by patient arm position. Moderate thoracic spondylosis. Pacer with leads at right atrium and right ventricle. No lead discontinuity. Midline trachea. Mild cardiomegaly. No pleural effusion or pneumothorax. No congestive failure. Clear lungs. IMPRESSION: Cardiomegaly, without acute disease. Electronically Signed   By: Jeronimo Greaves M.D.   On: 10/23/2016 15:52   Dg Chest  2 View  Result Date: 10/10/2016 CLINICAL DATA:  Chest pain and shortness of breath for 1 day. EXAM: CHEST  2 VIEW COMPARISON:  Radiograph  04/21/2016 FINDINGS: Left-sided pacemaker remains in place. The cardiomediastinal contours are unchanged. The lungs are clear. Pulmonary vasculature is normal. No consolidation, pleural effusion, or pneumothorax. No acute osseous abnormalities are seen. IMPRESSION: No acute abnormality. Electronically Signed   By: Rubye Oaks M.D.   On: 10/10/2016 21:17   Ct Head Wo Contrast  Result Date: 10/23/2016 CLINICAL DATA:  Fall after syncopal episode, striking the back of the head. EXAM: CT HEAD WITHOUT CONTRAST CT CERVICAL SPINE WITHOUT CONTRAST TECHNIQUE: Multidetector CT imaging of the head and cervical spine was performed following the standard protocol without intravenous contrast. Multiplanar CT image reconstructions of the cervical spine were also generated. COMPARISON:  Multiple exams, including 06/26/2014 and 07/23/2012 FINDINGS: CT HEAD FINDINGS Brain: The brainstem, cerebellum, cerebral peduncles, thalami, basal ganglia, basilar cisterns, and ventricular system appear within normal limits. No intracranial hemorrhage, mass lesion, or acute CVA. Vascular: There is atherosclerotic calcification of the cavernous carotid arteries bilaterally. Skull: Mild hyperostosis frontalis interna. Incidental right occipital arachnoid granulation not appreciably changed from 04/19/2012. Sinuses/Orbits: Chronic right maxillary sinusitis. Other: No supplemental non-categorized findings. CT CERVICAL SPINE FINDINGS Alignment: No vertebral subluxation is observed. Skull base and vertebrae: Fused left C3-4 facet joint. Degenerative spurring and loss of articular space at the anterior C1- 2 articulation. Ossicle along the posterior margin of the spinous process of C6, well corticated. No visible fracture. Soft tissues and spinal canal: No prevertebral soft tissue swelling. Disc levels: In the  uncinate and facet spurring causes left foraminal impingement at C3-4, and borderline right foraminal impingement at C4-5 and C5-6. Upper chest: Unremarkable Other: No supplemental non-categorized findings. IMPRESSION: 1. No acute cervical spine findings. No acute intracranial findings. 2. Chronic right maxillary sinusitis. 3. Cervical spondylosis causing left foraminal impingement at C3-4. Electronically Signed   By: Gaylyn Rong M.D.   On: 10/23/2016 15:35   Ct Cervical Spine Wo Contrast  Result Date: 10/23/2016 CLINICAL DATA:  Fall after syncopal episode, striking the back of the head. EXAM: CT HEAD WITHOUT CONTRAST CT CERVICAL SPINE WITHOUT CONTRAST TECHNIQUE: Multidetector CT imaging of the head and cervical spine was performed following the standard protocol without intravenous contrast. Multiplanar CT image reconstructions of the cervical spine were also generated. COMPARISON:  Multiple exams, including 06/26/2014 and 07/23/2012 FINDINGS: CT HEAD FINDINGS Brain: The brainstem, cerebellum, cerebral peduncles, thalami, basal ganglia, basilar cisterns, and ventricular system appear within normal limits. No intracranial hemorrhage, mass lesion, or acute CVA. Vascular: There is atherosclerotic calcification of the cavernous carotid arteries bilaterally. Skull: Mild hyperostosis frontalis interna. Incidental right occipital arachnoid granulation not appreciably changed from 04/19/2012. Sinuses/Orbits: Chronic right maxillary sinusitis. Other: No supplemental non-categorized findings. CT CERVICAL SPINE FINDINGS Alignment: No vertebral subluxation is observed. Skull base and vertebrae: Fused left C3-4 facet joint. Degenerative spurring and loss of articular space at the anterior C1- 2 articulation. Ossicle along the posterior margin of the spinous process of C6, well corticated. No visible fracture. Soft tissues and spinal canal: No prevertebral soft tissue swelling. Disc levels: In the uncinate and facet  spurring causes left foraminal impingement at C3-4, and borderline right foraminal impingement at C4-5 and C5-6. Upper chest: Unremarkable Other: No supplemental non-categorized findings. IMPRESSION: 1. No acute cervical spine findings. No acute intracranial findings. 2. Chronic right maxillary sinusitis. 3. Cervical spondylosis causing left foraminal impingement at C3-4. Electronically Signed   By: Gaylyn Rong M.D.   On: 10/23/2016 15:35   US Renal  Result Date: 10/23/2016 CLINICAL DATA:  Acute renal failure. Increasing creatinine over  the past 2 weeks. Fell this morning. History of hypertension and diabetes. Stage 3 chronic kidney disease. EXAM: RENAL / URINARY TRACT ULTRASOUND COMPLETE COMPARISON:  Ultrasound kidneys 04/20/2014. CT abdomen and pelvis 08/05/2013 FINDINGS: Right Kidney: Length: 10.4 cm. Diffuse parenchymal thinning with increased parenchymal echotexture suggesting chronic medical renal disease. No hydronephrosis or solid mass identified. Left Kidney: Length: 11.3 cm. Diffuse parenchymal thinning with increased parenchymal echotexture suggesting chronic medical renal disease. No hydronephrosis or solid mass identified. Bladder: Bladder is incompletely distended without evidence of wall thickening or filling defect. IMPRESSION: Bilateral parenchymal thinning and increased echotexture suggesting chronic medical renal disease. No hydronephrosis. Electronically Signed   By: Burman Nieves M.D.   On: 10/23/2016 23:58   Nm Myocar Multi W/spect W/wall Motion / Ef  Result Date: 10/14/2016  There was no ST segment deviation noted during stress.  No T wave inversion was noted during stress.  There is a very small mild intensity defect present in the apical inferior and apical lateral location that is fixed and likely represents diaphragmatic and large breast attenuation. No ischemia noted.  This is a low risk study.  The left ventricular ejection fraction is normal (55-65%).  Nuclear  stress EF: 61%.  The study is normal.    Nm Pulmonary Vent And Perf (v/q Scan)  Result Date: 10/11/2016 CLINICAL DATA:  Shortness of Breath EXAM: NUCLEAR MEDICINE VENTILATION - PERFUSION LUNG SCAN TECHNIQUE: Ventilation images were obtained in multiple projections using inhaled aerosol Tc-24m DTPA. Perfusion images were obtained in multiple projections after intravenous injection of Tc-12m MAA. RADIOPHARMACEUTICALS:  31.7 mCi Technetium-16m DTPA aerosol inhalation and 4.3 mCi Technetium-31m MAA IV COMPARISON:  Chest x-ray 10/10/2016 FINDINGS: Ventilation: No focal ventilation defect. Perfusion: No wedge shaped peripheral perfusion defects to suggest acute pulmonary embolism. IMPRESSION: No evidence of pulmonary embolus. Electronically Signed   By: Charlett Nose M.D.   On: 10/11/2016 10:48    Lab Data:  CBC:  Recent Labs Lab 10/23/16 1422 10/24/16 0424  WBC 7.4 6.0  HGB 11.0* 9.9*  HCT 34.4* 31.0*  MCV 95.3 94.8  PLT 238 230   Basic Metabolic Panel:  Recent Labs Lab 10/23/16 1422 10/24/16 0424 10/25/16 0606  NA 136 139 140  K 4.0 3.2* 3.2*  CL 101 105 109  CO2 24 24 23   GLUCOSE 260* 264* 138*  BUN 40* 38* 32*  CREATININE 3.55* 3.17* 2.94*  CALCIUM 8.8* 8.4* 7.9*   GFR: Estimated Creatinine Clearance: 22.6 mL/min (A) (by C-G formula based on SCr of 2.94 mg/dL (H)). Liver Function Tests: No results for input(s): AST, ALT, ALKPHOS, BILITOT, PROT, ALBUMIN in the last 168 hours. No results for input(s): LIPASE, AMYLASE in the last 168 hours. No results for input(s): AMMONIA in the last 168 hours. Coagulation Profile: No results for input(s): INR, PROTIME in the last 168 hours. Cardiac Enzymes:  Recent Labs Lab 10/23/16 1422  TROPONINI <0.03   BNP (last 3 results) No results for input(s): PROBNP in the last 8760 hours. HbA1C: No results for input(s): HGBA1C in the last 72 hours. CBG:  Recent Labs Lab 10/24/16 1158 10/24/16 1706 10/24/16 2139 10/25/16 0800  10/25/16 1231  GLUCAP 287* 272* 116* 138* 215*   Lipid Profile: No results for input(s): CHOL, HDL, LDLCALC, TRIG, CHOLHDL, LDLDIRECT in the last 72 hours. Thyroid Function Tests: No results for input(s): TSH, T4TOTAL, FREET4, T3FREE, THYROIDAB in the last 72 hours. Anemia Panel: No results for input(s): VITAMINB12, FOLATE, FERRITIN, TIBC, IRON, RETICCTPCT in the last 72 hours.  Urine analysis:    Component Value Date/Time   COLORURINE YELLOW 10/23/2016 1528   APPEARANCEUR CLEAR 10/23/2016 1528   LABSPEC 1.015 10/23/2016 1528   PHURINE 5.5 10/23/2016 1528   GLUCOSEU NEGATIVE 10/23/2016 1528   HGBUR NEGATIVE 10/23/2016 1528   BILIRUBINUR NEGATIVE 10/23/2016 1528   KETONESUR NEGATIVE 10/23/2016 1528   PROTEINUR NEGATIVE 10/23/2016 1528   UROBILINOGEN 1.0 11/28/2014 1125   NITRITE NEGATIVE 10/23/2016 1528   LEUKOCYTESUR NEGATIVE 10/23/2016 1528     Trenese Haft M.D. [facility] 10/25/2016, 2:23 PM  Pager: [phone] Between 7am to 7pm - call Pager - [phone]  After 7pm go to [email or web address] - password [email or web address]  Call night coverage person covering after 7pm

## 2016-10-26 DIAGNOSIS — E1169 Type 2 diabetes mellitus with other specified complication: Secondary | ICD-10-CM

## 2016-10-26 DIAGNOSIS — R55 Syncope and collapse: Secondary | ICD-10-CM

## 2016-10-26 DIAGNOSIS — N179 Acute kidney failure, unspecified: Principal | ICD-10-CM

## 2016-10-26 DIAGNOSIS — E669 Obesity, unspecified: Secondary | ICD-10-CM

## 2016-10-26 DIAGNOSIS — N183 Chronic kidney disease, stage 3 (moderate): Secondary | ICD-10-CM

## 2016-10-26 LAB — BASIC METABOLIC PANEL
Anion gap: 6 (ref 5–15)
BUN: 26 mg/dL — ABNORMAL HIGH (ref 6–20)
CO2: 23 mmol/L (ref 22–32)
Calcium: 8.1 mg/dL — ABNORMAL LOW (ref 8.9–10.3)
Chloride: 113 mmol/L — ABNORMAL HIGH (ref 101–111)
Creatinine, Ser: 2.47 mg/dL — ABNORMAL HIGH (ref 0.44–1.00)
GFR calc Af Amer: 20 mL/min — ABNORMAL LOW (ref >60)
GFR calc non Af Amer: 18 mL/min — ABNORMAL LOW (ref >60)
Glucose, Bld: 111 mg/dL — ABNORMAL HIGH (ref 65–99)
Potassium: 3.6 mmol/L (ref 3.5–5.1)
Sodium: 142 mmol/L (ref 135–145)

## 2016-10-26 LAB — GLUCOSE, CAPILLARY: Glucose-Capillary: 134 mg/dL — ABNORMAL HIGH (ref 65–99)

## 2016-10-26 NOTE — Progress Notes (Signed)
Tara Jackson to be D/C'd Home with Home Health per MD order. Discussed with the patient and all questions fully answered.  Allergies as of 10/26/2016      Reactions   Iodine Other (See Comments)   ONLY IV--Makes unconscious   Iohexol Other (See Comments)    Code: SOB, Desc: cardiac arrest w/ iv contrast, has never used 13 hr prep//Tara Jackson, Onset Date: [id number]   Metoclopramide Hcl Other (See Comments)   suicidal   Sulfa Antibiotics Hives   Lactose Intolerance (gi) Diarrhea   Lansoprazole Nausea And Vomiting      Medication List    STOP taking these medications   EX-LAX PO   furosemide 40 MG tablet Commonly known as:  LASIX   irbesartan 150 MG tablet Commonly known as:  AVAPRO   pantoprazole 40 MG tablet Commonly known as:  PROTONIX   sodium phosphate enema     TAKE these medications   acetaminophen 500 MG tablet Commonly known as:  TYLENOL Take 500 mg by mouth every 6 (six) hours as needed for moderate pain.   albuterol 108 (90 Base) MCG/ACT inhaler Commonly known as:  PROVENTIL HFA;VENTOLIN HFA Inhale 2 puffs into the lungs every 6 (six) hours as needed for wheezing.   aspirin-acetaminophen-caffeine 250-250-65 MG tablet Commonly known as:  EXCEDRIN MIGRAINE Take 1 tablet by mouth every 6 (six) hours as needed for headache.   Biotin 1 MG Caps Take 1 mg by mouth daily as needed (supplement).   busPIRone 10 MG tablet Commonly known as:  BUSPAR Take 1 tablet (10 mg total) by mouth every morning.   clonazePAM 0.5 MG tablet Commonly known as:  KLONOPIN Take 0.5-1 mg by mouth 2 (two) times daily. Takes 2 tabs in am and 1 tab in pm   clopidogrel 75 MG tablet Commonly known as:  PLAVIX Take 75 mg by mouth every morning.   dicyclomine 10 MG capsule Commonly known as:  BENTYL Take 10 mg by mouth 2 (two) times daily.   esomeprazole 40 MG capsule Commonly known as:  NEXIUM Take 40 mg by mouth daily before breakfast.   glipiZIDE 10 MG tablet Commonly  known as:  GLUCOTROL Take 20 mg by mouth 2 (two) times daily before a meal.   insulin NPH Human 100 UNIT/ML injection Commonly known as:  HUMULIN N,NOVOLIN N Inject 0-20 Units into the skin See admin instructions. Novolin-N Give 20 unit in morning; give 10units at 4pm; evening dose as needed.  If less than 150 blood sugar= 0units.  If greater than 200 blood sugar= give 10units to decrease by 100 blood sugar   insulin regular 100 units/mL injection Commonly known as:  NOVOLIN R,HUMULIN R Inject 0-25 Units into the skin See admin instructions. Give 25 unit in morning; give 20units at 4pm; evening dose as needed.  If less than 150 blood sugar= 0units.  If greater than 200 blood sugar= give 10units to decrease by 100 blood sugar   ipratropium 0.03 % nasal spray Commonly known as:  ATROVENT Place 2 sprays into both nostrils every 12 (twelve) hours. What changed:  when to take this  reasons to take this   levothyroxine 100 MCG tablet Commonly known as:  SYNTHROID, LEVOTHROID Take 100 mcg by mouth every morning.   lidocaine 5 % Commonly known as:  LIDODERM Place 3 patches onto the skin daily as needed (pain). Remove & Discard patch within 12 hours or as directed by MD   LORazepam 0.5 MG tablet Commonly known  as:  ATIVAN Take 0.5 mg by mouth daily as needed for anxiety.   magnesium 30 MG tablet Take 30 mg by mouth daily.   nortriptyline 50 MG capsule Commonly known as:  PAMELOR TAKE 2 CAPSULES (100MG ) AT BEDTIME   polyethylene glycol packet Commonly known as:  MIRALAX / GLYCOLAX Take 17 g by mouth every other day.   potassium chloride SA 20 MEQ tablet Commonly known as:  K-DUR,KLOR-CON Take 1 tablet (20 mEq total) by mouth 2 (two) times daily. What changed:  how much to take   solifenacin 10 MG tablet Commonly known as:  VESICARE Take 10 mg by mouth daily as needed (overactive bladder).   terconazole 0.4 % vaginal cream Commonly known as:  TERAZOL 7 Place 1 applicator  vaginally at bedtime as needed (for yeast infection).   traMADol 50 MG tablet Commonly known as:  ULTRAM Take 1 tablet (50 mg total) by mouth every 12 (twelve) hours as needed for moderate pain.   TRULANCE PO Take 1 capsule by mouth daily as needed (constipation).   VITAMIN D-3 PO Take 2 tablets by mouth every morning.            Discharge Care Instructions        Start     Ordered   10/26/16 0000  Increase activity slowly     10/26/16 1122   10/26/16 0000  Diet - low sodium heart healthy     10/26/16 1122   10/26/16 0000  Call MD for:  temperature >100.4     10/26/16 1122   10/26/16 0000  Call MD for:  persistant nausea and vomiting     10/26/16 1122   10/26/16 0000  Call MD for:  severe uncontrolled pain     10/26/16 1122   10/26/16 0000  Call MD for:  redness, tenderness, or signs of infection (pain, swelling, redness, odor or green/yellow discharge around incision site)     10/26/16 1122   10/26/16 0000  Call MD for:  difficulty breathing, headache or visual disturbances     10/26/16 1122   10/26/16 0000  Call MD for:  hives     10/26/16 1122   10/26/16 0000  Call MD for:  persistant dizziness or light-headedness     10/26/16 1122   10/26/16 0000  Call MD for:  extreme fatigue     10/26/16 1122      VVS, Skin clean, dry and intact without evidence of skin break down, no evidence of skin tears noted.  IV catheters discontinued intact. Site without signs and symptoms of complications. Dressing and pressure applied.  An After Visit Summary was printed and given to the patient.  Patient escorted via [place], and D/C home via private auto.  Tara Jackson  10/26/2016 1:22 PM

## 2016-10-26 NOTE — Discharge Summary (Signed)
Physician Discharge Summary  Tara Jackson  VVO:[id number]  DOB: 08-15-1940  DOA: 10/23/2016 PCP: Shon Baton, MD  Admit date: 10/23/2016 Discharge date: 10/26/2016  Admitted From: Home  Disposition:  Home   Recommendations for Outpatient Follow-up:  1. Follow up with PCP in 1-2 weeks 2. Please obtain BMP/CBC in one week to monitor renal function and Hgb  3. Consider nephrology evaluation  4. Lasix on hold to discuss if needed daily or PRN pending on Cr stabilization  5. 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 Patient had a negative cardiac workup including 2 day stress testing, 2-D echo when she was admitted 9/3-9/7. Patient complaint of being more weak since being home, recent falls on multiple occasions. Patient also reported headaches for which she was taking Aleve, Excedrin couple of times this past week. In ED, BUN 40, creatinine 3.55, patient was admitted for further workup.  Subjective: Patient seen and examined on day of discharge. She has no complaint.Denies chest pain, dizziness, weakness and shortness of breath. Patient tolerating diet well. Creatinine improved and she remains afebrile. Good urine output  Discharge Diagnoses/Hospital Course:    Syncope: due to dehydration, acute kidney injury, hypovolemia  - currently, troponins 3, EKG with paced rhythm - patient had a negative cardiac workup including 2 day stress testing, 2-D echo when she was admitted 9/3-9/7. - PT evaluation: recommended home health PT or supervision 24 hours      Acute renal failure superimposed on chronic kidney disease ([place]) stage III - patient presented with Cr 3.5.UA showed no UTI - renal US showed no hydronephrosis - Creatinine continues to trend down upon discharge 2.4 - last baseline 1.4-1.6  - Recommend nephrology evaluation as outpatient  - Continue to hold Lasix, Avapro until seen by PCP  - Monitor BMP in 1 week     Essential hypertension - BP improved and stable, continue to hold Lasix, Avapro    Morbid obesity (HCC) - BMI 44.89, outpatient follow up     Chronic diastolic heart failure (HCC) - currently euvolemic, continue to hold Lasix until Cr at baseline  - Follow up with PCP     Diabetes mellitus type 2 in obese (HCC)  - Stable during hospital stay  - Resume home medications with no changes     Chronic anemia - Baseline hemoglobin 9-10 - Currently at baseline    Hypothyroidism - Continue Synthroid 100 g daily  All other chronic medical condition were stable during the hospitalization.  Patient was seen by physical therapy, recommending home health PT On the day of the discharge the patient's vitals were stable, and no other acute medical condition were reported by patient. the patient was felt safe to be discharge to home   Discharge Instructions  You were cared for by a hospitalist during your hospital stay. If you have any questions about your discharge medications or the care you received while you were in the hospital after you are discharged, you can call the unit and asked to speak with the hospitalist on call if the hospitalist that took care of you is  not available. Once you are discharged, your primary care physician will handle any further medical issues. Please note that NO REFILLS for any discharge medications will be authorized once you are discharged, as it is imperative that you return to your primary care physician (or establish a relationship with a primary care physician if you do not have one) for  your aftercare needs so that they can reassess your need for medications and monitor your lab values.  Discharge Instructions    Call MD for:  difficulty breathing, headache or visual disturbances    Complete by:  As directed    Call MD for:  extreme fatigue    Complete by:  As directed    Call MD for:  hives    Complete by:  As directed    Call MD for:  persistant dizziness or light-headedness    Complete by:  As directed    Call MD for:  persistant nausea and vomiting    Complete by:  As directed    Call MD for:  redness, tenderness, or signs of infection (pain, swelling, redness, odor or green/yellow discharge around incision site)    Complete by:  As directed    Call MD for:  severe uncontrolled pain    Complete by:  As directed    Call MD for:  temperature >100.4    Complete by:  As directed    Diet - low sodium heart healthy    Complete by:  As directed    Increase activity slowly    Complete by:  As directed      Allergies as of 10/26/2016      Reactions   Iodine Other (See Comments)   ONLY IV--Makes unconscious   Iohexol Other (See Comments)    Code: SOB, Desc: cardiac arrest w/ iv contrast, has never used 13 hr prep//alice calhoun, Onset Date: 78469629   Metoclopramide Hcl Other (See Comments)   suicidal   Sulfa Antibiotics Hives   Lactose Intolerance (gi) Diarrhea   Lansoprazole Nausea And Vomiting      Medication List    STOP taking these medications   EX-LAX PO   furosemide 40 MG tablet Commonly known as:  LASIX   irbesartan 150 MG tablet Commonly known as:  AVAPRO   pantoprazole 40 MG tablet Commonly known as:  PROTONIX   sodium phosphate enema     TAKE these medications   acetaminophen 500 MG tablet Commonly known as:  TYLENOL Take 500 mg by mouth every 6 (six) hours as needed for moderate pain.   albuterol 108 (90 Base) MCG/ACT inhaler Commonly known as:  PROVENTIL HFA;VENTOLIN HFA Inhale 2 puffs into the lungs every 6 (six) hours as needed  for wheezing.   aspirin-acetaminophen-caffeine 250-250-65 MG tablet Commonly known as:  EXCEDRIN MIGRAINE Take 1 tablet by mouth every 6 (six) hours as needed for headache.   Biotin 1 MG Caps Take 1 mg by mouth daily as needed (supplement).   busPIRone 10 MG tablet Commonly known as:  BUSPAR Take 1 tablet (10 mg total) by mouth every morning.   clonazePAM 0.5 MG tablet Commonly known as:  KLONOPIN Take 0.5-1 mg by mouth 2 (two) times daily. Takes 2 tabs in am and 1 tab in pm   clopidogrel 75 MG tablet Commonly known as:  PLAVIX Take 75 mg by mouth every morning.   dicyclomine 10 MG capsule Commonly known as:  BENTYL Take 10 mg by mouth 2 (two) times daily.   esomeprazole  40 MG capsule Commonly known as:  NEXIUM Take 40 mg by mouth daily before breakfast.   glipiZIDE 10 MG tablet Commonly known as:  GLUCOTROL Take 20 mg by mouth 2 (two) times daily before a meal.   insulin NPH Human 100 UNIT/ML injection Commonly known as:  HUMULIN N,NOVOLIN N Inject 0-20 Units into the skin See admin instructions. Novolin-N Give 20 unit in morning; give 10units at 4pm; evening dose as needed.  If less than 150 blood sugar= 0units.  If greater than 200 blood sugar= give 10units to decrease by 100 blood sugar   insulin regular 100 units/mL injection Commonly known as:  NOVOLIN R,HUMULIN R Inject 0-25 Units into the skin See admin instructions. Give 25 unit in morning; give 20units at 4pm; evening dose as needed.  If less than 150 blood sugar= 0units.  If greater than 200 blood sugar= give 10units to decrease by 100 blood sugar   ipratropium 0.03 % nasal spray Commonly known as:  ATROVENT Place 2 sprays into both nostrils every 12 (twelve) hours. What changed:  when to take this  reasons to take this   levothyroxine 100 MCG tablet Commonly known as:  SYNTHROID, LEVOTHROID Take 100 mcg by mouth every morning.   lidocaine 5 % Commonly known as:  LIDODERM Place 3 patches onto the  skin daily as needed (pain). Remove & Discard patch within 12 hours or as directed by MD   LORazepam 0.5 MG tablet Commonly known as:  ATIVAN Take 0.5 mg by mouth daily as needed for anxiety.   magnesium 30 MG tablet Take 30 mg by mouth daily.   nortriptyline 50 MG capsule Commonly known as:  PAMELOR TAKE 2 CAPSULES (100MG ) AT BEDTIME   polyethylene glycol packet Commonly known as:  MIRALAX / GLYCOLAX Take 17 g by mouth every other day.   potassium chloride SA 20 MEQ tablet Commonly known as:  K-DUR,KLOR-CON Take 1 tablet (20 mEq total) by mouth 2 (two) times daily. What changed:  how much to take   solifenacin 10 MG tablet Commonly known as:  VESICARE Take 10 mg by mouth daily as needed (overactive bladder).   terconazole 0.4 % vaginal cream Commonly known as:  TERAZOL 7 Place 1 applicator vaginally at bedtime as needed (for yeast infection).   traMADol 50 MG tablet Commonly known as:  ULTRAM Take 1 tablet (50 mg total) by mouth every 12 (twelve) hours as needed for moderate pain.   TRULANCE PO Take 1 capsule by mouth daily as needed (constipation).   VITAMIN D-3 PO Take 2 tablets by mouth every morning.            Discharge Care Instructions        Start     Ordered   10/26/16 0000  Increase activity slowly     10/26/16 1122   10/26/16 0000  Diet - low sodium heart healthy     10/26/16 1122   10/26/16 0000  Call MD for:  temperature >100.4     10/26/16 1122   10/26/16 0000  Call MD for:  persistant nausea and vomiting     10/26/16 1122   10/26/16 0000  Call MD for:  severe uncontrolled pain     10/26/16 1122   10/26/16 0000  Call MD for:  redness, tenderness, or signs of infection (pain, swelling, redness, odor or green/yellow discharge around incision site)     10/26/16 1122   10/26/16 0000  Call MD for:  difficulty breathing, headache  or visual disturbances     10/26/16 1122   10/26/16 0000  Call MD for:  hives     10/26/16 1122   10/26/16 0000   Call MD for:  persistant dizziness or light-headedness     10/26/16 1122   10/26/16 0000  Call MD for:  extreme fatigue     10/26/16 1122     Follow-up Information    [facility].   Why:  home health services to resume, office will call and set up home visits Contact information: [address] [place] [address] [phone]        Shon Baton, MD. Schedule an appointment as soon as possible for a visit in 2 week(s).   Specialty:  Internal Medicine Why:  hospital follow up  Contact information: [address] [place] [address] [phone]          Allergies  Allergen Reactions  . Iodine Other (See Comments)    ONLY IV--Makes unconscious  . Iohexol Other (See Comments)     Code: SOB, Desc: cardiac arrest w/ iv contrast, has never used 13 hr prep//alice calhoun, Onset Date: 66063016   . Metoclopramide Hcl Other (See Comments)    suicidal  . Sulfa Antibiotics Hives  . Lactose Intolerance (Gi) Diarrhea  . Lansoprazole Nausea And Vomiting    Consultations:  None  Procedures/Studies: Dg Chest 2 View  Result Date: 10/23/2016 CLINICAL DATA:  SOB since fall this AM. No chest pain. Hx of pacemaker - 2016; CAD, COPD. Hx of HTN- on meds; hx of diabetes mellitus. EXAM: CHEST  2 VIEW COMPARISON:  10/10/2016 FINDINGS: Lateral view degraded by patient arm position. Moderate thoracic spondylosis. Pacer with leads at right atrium and right ventricle. No lead discontinuity. Midline trachea. Mild cardiomegaly. No pleural effusion or pneumothorax. No congestive failure. Clear lungs. IMPRESSION: Cardiomegaly, without acute disease. Electronically Signed   By: Abigail Miyamoto M.D.   On: 10/23/2016 15:52   Dg Chest 2 View  Result Date: 10/10/2016 CLINICAL DATA:  Chest pain and shortness of breath for 1 day. EXAM: CHEST  2 VIEW COMPARISON:  Radiograph 04/21/2016 FINDINGS: Left-sided pacemaker remains in place. The cardiomediastinal contours are  unchanged. The lungs are clear. Pulmonary vasculature is normal. No consolidation, pleural effusion, or pneumothorax. No acute osseous abnormalities are seen. IMPRESSION: No acute abnormality. Electronically Signed   By: Jeb Levering M.D.   On: 10/10/2016 21:17   Ct Head Wo Contrast  Result Date: 10/23/2016 CLINICAL DATA:  Fall after syncopal episode, striking the back of the head. EXAM: CT HEAD WITHOUT CONTRAST CT CERVICAL SPINE WITHOUT CONTRAST TECHNIQUE: Multidetector CT imaging of the head and cervical spine was performed following the standard protocol without intravenous contrast. Multiplanar CT image reconstructions of the cervical spine were also generated. COMPARISON:  Multiple exams, including 06/26/2014 and 07/23/2012 FINDINGS: CT HEAD FINDINGS Brain: The brainstem, cerebellum, cerebral peduncles, thalami, basal ganglia, basilar cisterns, and ventricular system appear within normal limits. No intracranial hemorrhage, mass lesion, or acute CVA. Vascular: There is atherosclerotic calcification of the cavernous carotid arteries bilaterally. Skull: Mild hyperostosis frontalis interna. Incidental right occipital arachnoid granulation not appreciably changed from 04/19/2012. Sinuses/Orbits: Chronic right maxillary sinusitis. Other: No supplemental non-categorized findings. CT CERVICAL SPINE FINDINGS Alignment: No vertebral subluxation is observed. Skull base and vertebrae: Fused left C3-4 facet joint. Degenerative spurring and loss of articular space at the anterior C1- 2 articulation. Ossicle along the posterior margin of the spinous process of C6, well corticated.  No visible fracture. Soft tissues and spinal canal: No prevertebral soft tissue swelling. Disc levels: In the uncinate and facet spurring causes left foraminal impingement at C3-4, and borderline right foraminal impingement at C4-5 and C5-6. Upper chest: Unremarkable Other: No supplemental non-categorized findings. IMPRESSION: 1. No acute  cervical spine findings. No acute intracranial findings. 2. Chronic right maxillary sinusitis. 3. Cervical spondylosis causing left foraminal impingement at C3-4. Electronically Signed   By: Van Clines M.D.   On: 10/23/2016 15:35   Ct Cervical Spine Wo Contrast  Result Date: 10/23/2016 CLINICAL DATA:  Fall after syncopal episode, striking the back of the head. EXAM: CT HEAD WITHOUT CONTRAST CT CERVICAL SPINE WITHOUT CONTRAST TECHNIQUE: Multidetector CT imaging of the head and cervical spine was performed following the standard protocol without intravenous contrast. Multiplanar CT image reconstructions of the cervical spine were also generated. COMPARISON:  Multiple exams, including 06/26/2014 and 07/23/2012 FINDINGS: CT HEAD FINDINGS Brain: The brainstem, cerebellum, cerebral peduncles, thalami, basal ganglia, basilar cisterns, and ventricular system appear within normal limits. No intracranial hemorrhage, mass lesion, or acute CVA. Vascular: There is atherosclerotic calcification of the cavernous carotid arteries bilaterally. Skull: Mild hyperostosis frontalis interna. Incidental right occipital arachnoid granulation not appreciably changed from 04/19/2012. Sinuses/Orbits: Chronic right maxillary sinusitis. Other: No supplemental non-categorized findings. CT CERVICAL SPINE FINDINGS Alignment: No vertebral subluxation is observed. Skull base and vertebrae: Fused left C3-4 facet joint. Degenerative spurring and loss of articular space at the anterior C1- 2 articulation. Ossicle along the posterior margin of the spinous process of C6, well corticated. No visible fracture. Soft tissues and spinal canal: No prevertebral soft tissue swelling. Disc levels: In the uncinate and facet spurring causes left foraminal impingement at C3-4, and borderline right foraminal impingement at C4-5 and C5-6. Upper chest: Unremarkable Other: No supplemental non-categorized findings. IMPRESSION: 1. No acute cervical spine  findings. No acute intracranial findings. 2. Chronic right maxillary sinusitis. 3. Cervical spondylosis causing left foraminal impingement at C3-4. Electronically Signed   By: Van Clines M.D.   On: 10/23/2016 15:35   US Renal  Result Date: 10/23/2016 CLINICAL DATA:  Acute renal failure. Increasing creatinine over the past 2 weeks. Fell this morning. History of hypertension and diabetes. Stage 3 chronic kidney disease. EXAM: RENAL / URINARY TRACT ULTRASOUND COMPLETE COMPARISON:  Ultrasound kidneys 04/20/2014. CT abdomen and pelvis 08/05/2013 FINDINGS: Right Kidney: Length: 10.4 cm. Diffuse parenchymal thinning with increased parenchymal echotexture suggesting chronic medical renal disease. No hydronephrosis or solid mass identified. Left Kidney: Length: 11.3 cm. Diffuse parenchymal thinning with increased parenchymal echotexture suggesting chronic medical renal disease. No hydronephrosis or solid mass identified. Bladder: Bladder is incompletely distended without evidence of wall thickening or filling defect. IMPRESSION: Bilateral parenchymal thinning and increased echotexture suggesting chronic medical renal disease. No hydronephrosis. Electronically Signed   By: Lucienne Capers M.D.   On: 10/23/2016 23:58   Nm Myocar Multi W/spect W/wall Motion / Ef  Result Date: 10/14/2016  There was no ST segment deviation noted during stress.  No T wave inversion was noted during stress.  There is a very small mild intensity defect present in the apical inferior and apical lateral location that is fixed and likely represents diaphragmatic and large breast attenuation. No ischemia noted.  This is a low risk study.  The left ventricular ejection fraction is normal (55-65%).  Nuclear stress EF: 61%.  The study is normal.    Nm Pulmonary Vent And Perf (v/q Scan)  Result Date: 10/11/2016 CLINICAL DATA:  Shortness of  Breath EXAM: NUCLEAR MEDICINE VENTILATION - PERFUSION LUNG SCAN TECHNIQUE: Ventilation images  were obtained in multiple projections using inhaled aerosol Tc-65m DTPA. Perfusion images were obtained in multiple projections after intravenous injection of Tc-88m MAA. RADIOPHARMACEUTICALS:  31.7 mCi Technetium-80m DTPA aerosol inhalation and 4.3 mCi Technetium-23m MAA IV COMPARISON:  Chest x-ray 10/10/2016 FINDINGS: Ventilation: No focal ventilation defect. Perfusion: No wedge shaped peripheral perfusion defects to suggest acute pulmonary embolism. IMPRESSION: No evidence of pulmonary embolus. Electronically Signed   By: Rolm Baptise M.D.   On: 10/11/2016 10:48   None   Discharge Exam: Vitals:   10/25/16 2124 10/26/16 0517  BP: (!) 138/52 (!) 129/53  Pulse: 61 (!) 58  Resp: 18 18  Temp: 98.3 F (36.8 C) 98.1 F (36.7 C)  SpO2: 100% 98%   Vitals:   10/25/16 0519 10/25/16 1546 10/25/16 2124 10/26/16 0517  BP: (!) 129/45 (!) 140/45 (!) 138/52 (!) 129/53  Pulse: (!) 58 62 61 (!) 58  Resp: 20 20 18 18   Temp: 98 F (36.7 C) 97.9 F (36.6 C) 98.3 F (36.8 C) 98.1 F (36.7 C)  TempSrc: Oral Oral Oral Oral  SpO2: 100% 100% 100% 98%  Weight:    132.5 kg (292 lb)  Height:        General: Pt is alert, awake, not in acute distress Cardiovascular: RRR, S1/S2 +, no rubs, no gallops Respiratory: CTA bilaterally, no wheezing, no rhonchi Abdominal: Soft, NT, ND, bowel sounds +. Obese  Extremities: no edema, no cyanosis   The results of significant diagnostics from this hospitalization (including imaging, microbiology, ancillary and laboratory) are listed below for reference.     Microbiology: No results found for this or any previous visit (from the past 240 hour(s)).   Labs: BNP (last 3 results) No results for input(s): BNP in the last 8760 hours. Basic Metabolic Panel:  Recent Labs Lab 10/23/16 1422 10/24/16 0424 10/25/16 0606 10/26/16 0501  NA 136 139 140 142  K 4.0 3.2* 3.2* 3.6  CL 101 105 109 113*  CO2 24 24 23 23   GLUCOSE 260* 264* 138* 111*  BUN 40* 38* 32* 26*   CREATININE 3.55* 3.17* 2.94* 2.47*  CALCIUM 8.8* 8.4* 7.9* 8.1*   Liver Function Tests: No results for input(s): AST, ALT, ALKPHOS, BILITOT, PROT, ALBUMIN in the last 168 hours. No results for input(s): LIPASE, AMYLASE in the last 168 hours. No results for input(s): AMMONIA in the last 168 hours. CBC:  Recent Labs Lab 10/23/16 1422 10/24/16 0424  WBC 7.4 6.0  HGB 11.0* 9.9*  HCT 34.4* 31.0*  MCV 95.3 94.8  PLT 238 230   Cardiac Enzymes:  Recent Labs Lab 10/23/16 1422  TROPONINI <0.03   BNP: Invalid input(s): POCBNP CBG:  Recent Labs Lab 10/25/16 0800 10/25/16 1231 10/25/16 1652 10/25/16 2126 10/26/16 0812  GLUCAP 138* 215* 160* 192* 134*   D-Dimer No results for input(s): DDIMER in the last 72 hours. Hgb A1c No results for input(s): HGBA1C in the last 72 hours. Lipid Profile No results for input(s): CHOL, HDL, LDLCALC, TRIG, CHOLHDL, LDLDIRECT in the last 72 hours. Thyroid function studies No results for input(s): TSH, T4TOTAL, T3FREE, THYROIDAB in the last 72 hours.  Invalid input(s): FREET3 Anemia work up No results for input(s): VITAMINB12, FOLATE, FERRITIN, TIBC, IRON, RETICCTPCT in the last 72 hours. Urinalysis    Component Value Date/Time   COLORURINE YELLOW 10/23/2016 [place] 10/23/2016 1528   LABSPEC 1.015 10/23/2016 1528   PHURINE 5.5  10/23/2016 [place] 10/23/2016 [place] 10/23/2016 [place] 10/23/2016 [place] 10/23/2016 1528   PROTEINUR NEGATIVE 10/23/2016 1528   UROBILINOGEN 1.0 11/28/2014 1125   NITRITE NEGATIVE 10/23/2016 1528   LEUKOCYTESUR NEGATIVE 10/23/2016 1528   Sepsis Labs Invalid input(s): PROCALCITONIN,  WBC,  LACTICIDVEN Microbiology No results found for this or any previous visit (from the past 240 hour(s)).   Time coordinating discharge: 32 minutes  SIGNED:  Chipper Oman, MD  [facility] 10/26/2016, 11:22 AM  Pager  please text page via  [email or web address]

## 2016-10-27 DIAGNOSIS — D638 Anemia in other chronic diseases classified elsewhere: Secondary | ICD-10-CM | POA: Diagnosis not present

## 2016-10-27 DIAGNOSIS — N183 Chronic kidney disease, stage 3 (moderate): Secondary | ICD-10-CM | POA: Diagnosis not present

## 2016-10-27 DIAGNOSIS — G4730 Sleep apnea, unspecified: Secondary | ICD-10-CM | POA: Diagnosis not present

## 2016-10-27 DIAGNOSIS — I2510 Atherosclerotic heart disease of native coronary artery without angina pectoris: Secondary | ICD-10-CM | POA: Diagnosis not present

## 2016-10-27 DIAGNOSIS — I5032 Chronic diastolic (congestive) heart failure: Secondary | ICD-10-CM | POA: Diagnosis not present

## 2016-10-27 DIAGNOSIS — E039 Hypothyroidism, unspecified: Secondary | ICD-10-CM | POA: Diagnosis not present

## 2016-10-27 DIAGNOSIS — E785 Hyperlipidemia, unspecified: Secondary | ICD-10-CM | POA: Diagnosis not present

## 2016-10-27 DIAGNOSIS — I130 Hypertensive heart and chronic kidney disease with heart failure and stage 1 through stage 4 chronic kidney disease, or unspecified chronic kidney disease: Secondary | ICD-10-CM | POA: Diagnosis not present

## 2016-10-27 DIAGNOSIS — E1129 Type 2 diabetes mellitus with other diabetic kidney complication: Secondary | ICD-10-CM | POA: Diagnosis not present

## 2016-11-01 DIAGNOSIS — R55 Syncope and collapse: Secondary | ICD-10-CM | POA: Diagnosis not present

## 2016-11-07 ENCOUNTER — Telehealth: Payer: Self-pay | Admitting: Internal Medicine

## 2016-11-07 DIAGNOSIS — I2510 Atherosclerotic heart disease of native coronary artery without angina pectoris: Secondary | ICD-10-CM | POA: Diagnosis not present

## 2016-11-07 DIAGNOSIS — I130 Hypertensive heart and chronic kidney disease with heart failure and stage 1 through stage 4 chronic kidney disease, or unspecified chronic kidney disease: Secondary | ICD-10-CM | POA: Diagnosis not present

## 2016-11-07 DIAGNOSIS — E785 Hyperlipidemia, unspecified: Secondary | ICD-10-CM | POA: Diagnosis not present

## 2016-11-07 DIAGNOSIS — D638 Anemia in other chronic diseases classified elsewhere: Secondary | ICD-10-CM | POA: Diagnosis not present

## 2016-11-07 DIAGNOSIS — N183 Chronic kidney disease, stage 3 (moderate): Secondary | ICD-10-CM | POA: Diagnosis not present

## 2016-11-07 DIAGNOSIS — I5032 Chronic diastolic (congestive) heart failure: Secondary | ICD-10-CM | POA: Diagnosis not present

## 2016-11-07 DIAGNOSIS — G4730 Sleep apnea, unspecified: Secondary | ICD-10-CM | POA: Diagnosis not present

## 2016-11-07 DIAGNOSIS — E039 Hypothyroidism, unspecified: Secondary | ICD-10-CM | POA: Diagnosis not present

## 2016-11-07 DIAGNOSIS — E1129 Type 2 diabetes mellitus with other diabetic kidney complication: Secondary | ICD-10-CM | POA: Diagnosis not present

## 2016-11-07 NOTE — Telephone Encounter (Signed)
New message    RN from [facility] is calling about pt. She said pt is complaining of mid sternal chest pain and around her pace maker. Per nurse pt had a fall a couple weeks ago and states she has been having more pain since the fall. Please call.

## 2016-11-08 NOTE — Telephone Encounter (Signed)
Call returned to [facility] RN.  Per RN, Pt has had 2 recent falls, last fall with hospitalization 10/23/2016.  Post fall Pt has c/o left shoulder soreness, intermittent midsternal chest pain and pain around her pacemaker site.  Per RN she has not noticed any swelling, drainage, no fever.  RN states she is calling so that we would be aware to check her site when she comes in for her appointment scheduled for 11/15/2016.  Notified [place] RN if she noticed any changes in site or Pt develops increasing pain or fever we could get Pt in same day to device clinic.  RN indicates understanding.  Note added to appt regarding pain around PPM site.

## 2016-11-09 DIAGNOSIS — I2510 Atherosclerotic heart disease of native coronary artery without angina pectoris: Secondary | ICD-10-CM | POA: Diagnosis not present

## 2016-11-09 DIAGNOSIS — N183 Chronic kidney disease, stage 3 (moderate): Secondary | ICD-10-CM | POA: Diagnosis not present

## 2016-11-09 DIAGNOSIS — E1129 Type 2 diabetes mellitus with other diabetic kidney complication: Secondary | ICD-10-CM | POA: Diagnosis not present

## 2016-11-09 DIAGNOSIS — E785 Hyperlipidemia, unspecified: Secondary | ICD-10-CM | POA: Diagnosis not present

## 2016-11-09 DIAGNOSIS — D638 Anemia in other chronic diseases classified elsewhere: Secondary | ICD-10-CM | POA: Diagnosis not present

## 2016-11-09 DIAGNOSIS — I130 Hypertensive heart and chronic kidney disease with heart failure and stage 1 through stage 4 chronic kidney disease, or unspecified chronic kidney disease: Secondary | ICD-10-CM | POA: Diagnosis not present

## 2016-11-09 DIAGNOSIS — G4730 Sleep apnea, unspecified: Secondary | ICD-10-CM | POA: Diagnosis not present

## 2016-11-09 DIAGNOSIS — I5032 Chronic diastolic (congestive) heart failure: Secondary | ICD-10-CM | POA: Diagnosis not present

## 2016-11-09 DIAGNOSIS — E039 Hypothyroidism, unspecified: Secondary | ICD-10-CM | POA: Diagnosis not present

## 2016-11-14 DIAGNOSIS — I5032 Chronic diastolic (congestive) heart failure: Secondary | ICD-10-CM | POA: Diagnosis not present

## 2016-11-14 DIAGNOSIS — E785 Hyperlipidemia, unspecified: Secondary | ICD-10-CM | POA: Diagnosis not present

## 2016-11-14 DIAGNOSIS — G4730 Sleep apnea, unspecified: Secondary | ICD-10-CM | POA: Diagnosis not present

## 2016-11-14 DIAGNOSIS — I130 Hypertensive heart and chronic kidney disease with heart failure and stage 1 through stage 4 chronic kidney disease, or unspecified chronic kidney disease: Secondary | ICD-10-CM | POA: Diagnosis not present

## 2016-11-14 DIAGNOSIS — E039 Hypothyroidism, unspecified: Secondary | ICD-10-CM | POA: Diagnosis not present

## 2016-11-14 DIAGNOSIS — E1129 Type 2 diabetes mellitus with other diabetic kidney complication: Secondary | ICD-10-CM | POA: Diagnosis not present

## 2016-11-14 DIAGNOSIS — D638 Anemia in other chronic diseases classified elsewhere: Secondary | ICD-10-CM | POA: Diagnosis not present

## 2016-11-14 DIAGNOSIS — N183 Chronic kidney disease, stage 3 (moderate): Secondary | ICD-10-CM | POA: Diagnosis not present

## 2016-11-14 DIAGNOSIS — I2510 Atherosclerotic heart disease of native coronary artery without angina pectoris: Secondary | ICD-10-CM | POA: Diagnosis not present

## 2016-11-15 ENCOUNTER — Ambulatory Visit (INDEPENDENT_AMBULATORY_CARE_PROVIDER_SITE_OTHER): Payer: Medicare HMO | Admitting: Internal Medicine

## 2016-11-15 ENCOUNTER — Encounter: Payer: Self-pay | Admitting: Internal Medicine

## 2016-11-15 VITALS — BP 132/80 | HR 86 | Ht 67.0 in | Wt 277.2 lb

## 2016-11-15 DIAGNOSIS — K5901 Slow transit constipation: Secondary | ICD-10-CM | POA: Diagnosis not present

## 2016-11-15 DIAGNOSIS — Z950 Presence of cardiac pacemaker: Secondary | ICD-10-CM

## 2016-11-15 DIAGNOSIS — I495 Sick sinus syndrome: Secondary | ICD-10-CM

## 2016-11-15 NOTE — Progress Notes (Signed)
HPI Tara Jackson returns today for ongoing evaluation and management of sinus node dysfunction status post pacemaker insertion, hypertension, obesity, and DVTs. In the interim, she has had some problems with falling and been seen in the emergency department twice. When she gets up, she will sometimes get dizzy and lightheaded. Allergies  Allergen Reactions  . Iodine Other (See Comments)    ONLY IV--Makes unconscious  . Iohexol Other (See Comments)     Code: SOB, Desc: cardiac arrest w/ iv contrast, has never used 13 hr prep//alice calhoun, Onset Date: [id number]   . Metoclopramide Hcl Other (See Comments)    suicidal  . Sulfa Antibiotics Hives  . Lactose Intolerance (Gi) Diarrhea  . Lansoprazole Nausea And Vomiting     Current Outpatient Prescriptions  Medication Sig Dispense Refill  . acetaminophen (TYLENOL) 500 MG tablet Take 500 mg by mouth every 6 (six) hours as needed for moderate pain.     Marland Kitchen albuterol (PROVENTIL HFA;VENTOLIN HFA) 108 (90 BASE) MCG/ACT inhaler Inhale 2 puffs into the lungs every 6 (six) hours as needed for wheezing.    Marland Kitchen aspirin-acetaminophen-caffeine (EXCEDRIN MIGRAINE) 250-250-65 MG tablet Take 1 tablet by mouth every 6 (six) hours as needed for headache.    . Biotin 1 MG CAPS Take 1 mg by mouth daily as needed (supplement).    . busPIRone (BUSPAR) 10 MG tablet Take 1 tablet (10 mg total) by mouth every morning. 60 tablet 1  . Cholecalciferol (VITAMIN D-3 PO) Take 2 tablets by mouth every morning.     . clonazePAM (KLONOPIN) 0.5 MG tablet Take 0.5-1 mg by mouth 2 (two) times daily. Takes 2 tabs in am and 1 tab in pm    . clopidogrel (PLAVIX) 75 MG tablet Take 75 mg by mouth every morning.    . dicyclomine (BENTYL) 10 MG capsule Take 20 mg by mouth daily.     Marland Kitchen esomeprazole (NEXIUM) 40 MG capsule Take 40 mg by mouth daily before breakfast.    . glipiZIDE (GLUCOTROL) 10 MG tablet Take 20 mg by mouth 2 (two) times daily before a meal.     . insulin NPH (HUMULIN  N,NOVOLIN N) 100 UNIT/ML injection Inject 0-20 Units into the skin See admin instructions. Novolin-N Give 20 unit in morning; give 10units at 4pm; evening dose as needed.  If less than 150 blood sugar= 0units.  If greater than 200 blood sugar= give 10units to decrease by 100 blood sugar    . insulin regular (NOVOLIN R,HUMULIN R) 100 units/mL injection Inject 0-25 Units into the skin See admin instructions. Give 25 unit in morning; give 20units at 4pm; evening dose as needed.  If less than 150 blood sugar= 0units.  If greater than 200 blood sugar= give 10units to decrease by 100 blood sugar    . ipratropium (ATROVENT) 0.03 % nasal spray Place 2 sprays into both nostrils every 12 (twelve) hours. (Patient taking differently: Place 2 sprays into both nostrils every 12 (twelve) hours as needed for rhinitis. ) 30 mL 0  . levothyroxine (SYNTHROID, LEVOTHROID) 100 MCG tablet Take 100 mcg by mouth every morning.     . lidocaine (LIDODERM) 5 % Place 3 patches onto the skin daily as needed (pain). Remove & Discard patch within 12 hours or as directed by MD    . LORazepam (ATIVAN) 0.5 MG tablet Take 0.5 mg by mouth daily as needed for anxiety.    . magnesium 30 MG tablet Take 30 mg by mouth daily.    Marland Kitchen  nortriptyline (PAMELOR) 50 MG capsule TAKE 2 CAPSULES (100MG ) AT BEDTIME 180 capsule 1  . Plecanatide (TRULANCE PO) Take 1 capsule by mouth daily as needed (constipation).     . polyethylene glycol (MIRALAX / GLYCOLAX) packet Take 17 g by mouth every other day.     . potassium chloride SA (K-DUR,KLOR-CON) 20 MEQ tablet Take 1 tablet (20 mEq total) by mouth 2 (two) times daily. 10 tablet 0  . solifenacin (VESICARE) 10 MG tablet Take 10 mg by mouth daily as needed (overactive bladder).     Marland Kitchen terconazole (TERAZOL 7) 0.4 % vaginal cream Place 1 applicator vaginally at bedtime as needed (for yeast infection).     . traMADol (ULTRAM) 50 MG tablet Take 1 tablet (50 mg total) by mouth every 12 (twelve) hours as needed for  moderate pain. 30 tablet 0   No current facility-administered medications for this visit.      Past Medical History:  Diagnosis Date  . Anemia   . Anxiety   . Arthritis   . Asthma   . Brittle bone disease   . Cataract   . Chest pain    a. minimal CAD by cath in 2014 with 40% RI stenosis. b. low-risk NST in 11/2014.  Marland Kitchen Complication of anesthesia    hard to wake up after anesthesia, trouble turning head  . Depression   . Diabetes mellitus type 2, insulin dependent ([place])   . Diabetic neuropathy ([place])   . Diastolic dysfunction, left ventricle 11/30/14   EF 55%, grade 1 DD  . DVT (deep venous thrombosis) ([place])    "18 in RLE; 7 LLE prior to PE" (03/24/2015)  . Family history of adverse reaction to anesthesia    " the whole family is hard to wake up"  . Glaucoma   . H/O hiatal hernia   . Hyperlipidemia   . Hypertension associated with diabetes ([place])   . Hypothyroidism   . Kidney stones    "passed them"  . Memory loss 06/03/2014  . Normal cardiac stress test 11/30/14   Negative Myoview  . On home oxygen therapy    "2L oxygen concentrator @ night"  . Osteoporosis   . Oxygen desaturation during sleep    wears 2 liters of oxygen at night   . Palpitations 35years ago   Cardionet monitor - revealed mostly normal sinus rhythm, sinus bradycardia with first-degree A-V block heart rates mostly in the 50s and 60s with some 70s. No arrhythmias, PVCs or PACs noted.  . PE (pulmonary thromboembolism) ([place]) x 3, last one was ~ 2003   history of recurrent RLE DVT with PE - last PE ~>13 yrs; Maintained on Plavix  . Pneumonia   . Presence of permanent cardiac pacemaker   . Restless legs syndrome   . Sleep apnea    cpap disontinued; test done 07/14/2008 ordered per  Byrum  . Spinal headache   . Spinal stenosis    L 3, L 4 and L 5, and C 1, C 2 and C3  . Stroke [facility]) 04-19-2013   tia and stroke. Weakness Rt hand  . 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 s/p [employer] PPM in 03/2015  by Dr. Lovena Le secondary to sinus node dysfunction.   Marland Kitchen TIA (transient ischemic attack) March 2014    ROS:   All systems reviewed and negative except as noted in the HPI.   Past Surgical History:  Procedure Laterality Date  . ABDOMINAL HYSTERECTOMY    . APPENDECTOMY    .  BACK SURGERY     steroid inj  . CARDIAC CATHETERIZATION  02/14/2012   no evidence of obstructive coronary disease ,low EDP with normal EF  . CATARACT EXTRACTION W/ INTRAOCULAR LENS  IMPLANT, BILATERAL Bilateral 2000s  . CHOLECYSTECTOMY    . DOPPLER ECHOCARDIOGRAPHY  2014; October 2016   a. EF 23-76%; normal diastolic pressures, no regional wall motion motion abnormalities;; b/ F 55-60%. Normal wall motion. GR 1 DD. No valve lesions  . EP IMPLANTABLE DEVICE N/A 03/24/2015   [facility] PPM, Dr. Lovena Le  . ESOPHAGOGASTRODUODENOSCOPY  08/09/2011   Procedure: ESOPHAGOGASTRODUODENOSCOPY (EGD);  Surgeon: Jeryl Columbia, MD;  Location: [facility];  Service: Endoscopy;  Laterality: N/A;  . ESOPHAGOGASTRODUODENOSCOPY (EGD) WITH PROPOFOL N/A 11/12/2013   Procedure: ESOPHAGOGASTRODUODENOSCOPY (EGD) WITH PROPOFOL;  Surgeon: Jeryl Columbia, MD;  Location: [facility] ENDOSCOPY;  Service: Endoscopy;  Laterality: N/A;  . ESOPHAGOGASTRODUODENOSCOPY (EGD) WITH PROPOFOL N/A 05/05/2016   Procedure: ESOPHAGOGASTRODUODENOSCOPY (EGD) WITH PROPOFOL;  Surgeon: Clarene Essex, MD;  Location: [facility] ENDOSCOPY;  Service: Endoscopy;  Laterality: N/A;  . GLAUCOMA SURGERY Bilateral 2000s   "laser"  . HOT HEMOSTASIS  08/09/2011   Procedure: HOT HEMOSTASIS (ARGON PLASMA COAGULATION/BICAP);  Surgeon: Jeryl Columbia, MD;  Location: [facility];  Service: Endoscopy;  Laterality: N/A;  . HOT HEMOSTASIS N/A 11/12/2013   Procedure: HOT HEMOSTASIS (ARGON PLASMA COAGULATION/BICAP);  Surgeon: Jeryl Columbia, MD;  Location: [facility];  Service: Endoscopy;  Laterality: N/A;  . INSERT / REPLACE / REMOVE PACEMAKER  03/24/2015  . JOINT REPLACEMENT    . KNEE SURGERY Left done with  2nd left knee replacement   spacer bar placement  . LEFT HEART CATHETERIZATION WITH CORONARY ANGIOGRAM N/A 02/14/2012   Procedure: LEFT HEART CATHETERIZATION WITH CORONARY ANGIOGRAM;  Surgeon: Leonie Man, MD;  Location[facility];  Service: Cardiovascular;  Laterality: N/A;  . LEV DOPPLER  05/29/2012   RIGHT EXTREM. NORMAL VENOUS DUPLEX  . NM MYOCAR PERF WALL MOTION  01/26/2012; 11/2014   a. EF 79%,LV normal,ST SEGMENT CHANGE SUGGESTIVE OF ISCHEMIA.; LOW RISK, EF 60%. No ischemia or infarction. No wall motion abnormality   . PACEMAKER PLACEMENT    . REVISION TOTAL KNEE ARTHROPLASTY Left   . TONSILLECTOMY    . TOTAL KNEE ARTHROPLASTY Bilateral   . TRIGGER FINGER RELEASE  11/22/2011   Procedure: RELEASE TRIGGER FINGER/A-1 PULLEY;  Surgeon: Meredith Pel, MD;  Location: [facility];  Service: Orthopedics;  Laterality: Left;  Left trigger thumb release  . TRIGGER FINGER RELEASE Right yrs ago     Family History  Problem Relation Age of Onset  . Cancer Mother   . Cancer - Prostate Father      Social History   Social History  . Marital status: Married    Spouse name: [name]  . Number of children: 2  . Years of education: college   Occupational History  . retired    Social History Main Topics  . Smoking status: Never Smoker  . Smokeless tobacco: Never Used  . Alcohol use No  . Drug use: No  . Sexual activity: Yes   Other Topics Concern  . Not on file   Social History Narrative   Married [name].) married 53 years.   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         BP 132/80   Pulse 86   Ht 5\' 7"  (1.702 m)   Wt 277 lb 3.2 oz (125.7 kg)  SpO2 97%   BMI 43.42 kg/m   Physical Exam:  Well appearing NAD HEENT: Unremarkable Neck:  No JVD, no thyromegally Lymphatics:  No adenopathy Back:  No CVA tenderness Lungs:  Clear HEART:  Regular rate rhythm, no murmurs, no rubs, no clicks Abd:  soft, positive bowel sounds, no  organomegally, no rebound, no guarding Ext:  2 plus pulses, no edema, no cyanosis, no clubbing Skin:  No rashes no nodules Neuro:  CN II through XII intact, motor grossly intact  DEVICE  Normal device function.  See PaceArt for details.   Assess/Plan: 1. Recurrent episodes of dizziness associated with falls - I strongly suspect she is orthostatic as these episodes always occur when she stands up. I've asked the patient to monitor her blood pressure. If she has periods where her blood pressure is low, she is encouraged to eat more salt in her diet. She is on no blood pressure lowering medicines at this time. 2. Obesity - weight loss will be difficult for her because of her sedentary lifestyle. She is encouraged to lose weight. 3. Pacemaker- her Floyd pacemaker is working normally. She has approximately 13 years of battery longevity.  Cristopher Peru, M.D.

## 2016-11-15 NOTE — Patient Instructions (Signed)
Medication Instructions:  Your physician recommends that you continue on your current medications as directed. Please refer to the Current Medication list given to you today.  Labwork: None ordered.  Testing/Procedures: None ordered.  Follow-Up: Your physician wants you to follow-up in: one year with Dr.Taylor.   You will receive a reminder letter in the mail two months in advance. If you don't receive a letter, please call our office to schedule the follow-up appointment.  Remote monitoring is used to monitor your Pacemaker from home. This monitoring reduces the number of office visits required to check your device to one time per year. It allows [place] to keep an eye on the functioning of your device to ensure it is working properly. You are scheduled for a device check from home on 12/15/2016. You may send your transmission at any time that day. If you have a wireless device, the transmission will be sent automatically. After your physician reviews your transmission, you will receive a postcard with your next transmission date.    Any Other Special Instructions Will Be Listed Below (If Applicable).     If you need a refill on your cardiac medications before your next appointment, please call your pharmacy.

## 2016-11-17 ENCOUNTER — Ambulatory Visit
Admission: RE | Admit: 2016-11-17 | Discharge: 2016-11-17 | Disposition: A | Discharge status: Home / Self Care | Payer: Medicare HMO | Source: Ambulatory Visit | Attending: Gastroenterology | Admitting: Gastroenterology

## 2016-11-17 ENCOUNTER — Other Ambulatory Visit: Payer: Self-pay | Admitting: Gastroenterology

## 2016-11-17 DIAGNOSIS — G4730 Sleep apnea, unspecified: Secondary | ICD-10-CM | POA: Diagnosis not present

## 2016-11-17 DIAGNOSIS — E1129 Type 2 diabetes mellitus with other diabetic kidney complication: Secondary | ICD-10-CM | POA: Diagnosis not present

## 2016-11-17 DIAGNOSIS — R197 Diarrhea, unspecified: Secondary | ICD-10-CM

## 2016-11-17 DIAGNOSIS — N183 Chronic kidney disease, stage 3 (moderate): Secondary | ICD-10-CM | POA: Diagnosis not present

## 2016-11-17 DIAGNOSIS — D638 Anemia in other chronic diseases classified elsewhere: Secondary | ICD-10-CM | POA: Diagnosis not present

## 2016-11-17 DIAGNOSIS — I2510 Atherosclerotic heart disease of native coronary artery without angina pectoris: Secondary | ICD-10-CM | POA: Diagnosis not present

## 2016-11-17 DIAGNOSIS — R109 Unspecified abdominal pain: Secondary | ICD-10-CM | POA: Diagnosis not present

## 2016-11-17 DIAGNOSIS — I5032 Chronic diastolic (congestive) heart failure: Secondary | ICD-10-CM | POA: Diagnosis not present

## 2016-11-17 DIAGNOSIS — I130 Hypertensive heart and chronic kidney disease with heart failure and stage 1 through stage 4 chronic kidney disease, or unspecified chronic kidney disease: Secondary | ICD-10-CM | POA: Diagnosis not present

## 2016-11-17 DIAGNOSIS — E785 Hyperlipidemia, unspecified: Secondary | ICD-10-CM | POA: Diagnosis not present

## 2016-11-17 DIAGNOSIS — E039 Hypothyroidism, unspecified: Secondary | ICD-10-CM | POA: Diagnosis not present

## 2016-11-18 DIAGNOSIS — R197 Diarrhea, unspecified: Secondary | ICD-10-CM | POA: Diagnosis not present

## 2016-11-21 DIAGNOSIS — I10 Essential (primary) hypertension: Secondary | ICD-10-CM | POA: Diagnosis not present

## 2016-11-21 DIAGNOSIS — I509 Heart failure, unspecified: Secondary | ICD-10-CM | POA: Diagnosis not present

## 2016-11-22 ENCOUNTER — Telehealth: Payer: Self-pay | Admitting: Neurology

## 2016-11-22 NOTE — Telephone Encounter (Signed)
Patient called and requested to speak with someone regarding her recent falls. She states that she has fallen several times recently and she "refused to go the ED" she wants to see Dr. Leonie Jackson because she thinks that it is something internal in her brain. Please call and advise.

## 2016-11-22 NOTE — Telephone Encounter (Signed)
Rn call patient back about having repeated falls. Pt had CT scan, and Ct spine in hospital. Pt stated she thinks its something wrong with her brain. Pt was told it was her heart, but she thinks its her brain causing the falls. Rn schedule her for 12/01/2016 at 0100pm. Rn explain the November appt was cancel because she is being seen earlier. Pt verbalized understanding.

## 2016-11-23 DIAGNOSIS — E039 Hypothyroidism, unspecified: Secondary | ICD-10-CM | POA: Diagnosis not present

## 2016-11-23 DIAGNOSIS — I130 Hypertensive heart and chronic kidney disease with heart failure and stage 1 through stage 4 chronic kidney disease, or unspecified chronic kidney disease: Secondary | ICD-10-CM | POA: Diagnosis not present

## 2016-11-23 DIAGNOSIS — I2510 Atherosclerotic heart disease of native coronary artery without angina pectoris: Secondary | ICD-10-CM | POA: Diagnosis not present

## 2016-11-23 DIAGNOSIS — G4730 Sleep apnea, unspecified: Secondary | ICD-10-CM | POA: Diagnosis not present

## 2016-11-23 DIAGNOSIS — D638 Anemia in other chronic diseases classified elsewhere: Secondary | ICD-10-CM | POA: Diagnosis not present

## 2016-11-23 DIAGNOSIS — E1129 Type 2 diabetes mellitus with other diabetic kidney complication: Secondary | ICD-10-CM | POA: Diagnosis not present

## 2016-11-23 DIAGNOSIS — E785 Hyperlipidemia, unspecified: Secondary | ICD-10-CM | POA: Diagnosis not present

## 2016-11-23 DIAGNOSIS — N183 Chronic kidney disease, stage 3 (moderate): Secondary | ICD-10-CM | POA: Diagnosis not present

## 2016-11-23 DIAGNOSIS — I5032 Chronic diastolic (congestive) heart failure: Secondary | ICD-10-CM | POA: Diagnosis not present

## 2016-11-24 DIAGNOSIS — E782 Mixed hyperlipidemia: Secondary | ICD-10-CM | POA: Diagnosis not present

## 2016-11-24 DIAGNOSIS — G4733 Obstructive sleep apnea (adult) (pediatric): Secondary | ICD-10-CM | POA: Diagnosis not present

## 2016-11-24 DIAGNOSIS — D649 Anemia, unspecified: Secondary | ICD-10-CM | POA: Diagnosis not present

## 2016-11-24 DIAGNOSIS — E1140 Type 2 diabetes mellitus with diabetic neuropathy, unspecified: Secondary | ICD-10-CM | POA: Diagnosis not present

## 2016-11-24 DIAGNOSIS — I509 Heart failure, unspecified: Secondary | ICD-10-CM | POA: Diagnosis not present

## 2016-11-24 DIAGNOSIS — I129 Hypertensive chronic kidney disease with stage 1 through stage 4 chronic kidney disease, or unspecified chronic kidney disease: Secondary | ICD-10-CM | POA: Diagnosis not present

## 2016-11-24 DIAGNOSIS — Z6841 Body Mass Index (BMI) 40.0 and over, adult: Secondary | ICD-10-CM | POA: Diagnosis not present

## 2016-11-24 DIAGNOSIS — Z950 Presence of cardiac pacemaker: Secondary | ICD-10-CM | POA: Diagnosis not present

## 2016-11-24 DIAGNOSIS — E038 Other specified hypothyroidism: Secondary | ICD-10-CM | POA: Diagnosis not present

## 2016-11-24 DIAGNOSIS — I517 Cardiomegaly: Secondary | ICD-10-CM | POA: Diagnosis not present

## 2016-11-26 LAB — CUP PACEART INCLINIC DEVICE CHECK
Brady Statistic RA Percent Paced: 59 %
Brady Statistic RV Percent Paced: 30 %
Date Time Interrogation Session: 20181009040000
Implantable Lead Implant Date: 20170214
Implantable Lead Implant Date: 20170214
Implantable Lead Location: 753859
Implantable Lead Location: 753860
Implantable Lead Model: 7740
Implantable Lead Model: 7741
Implantable Lead Serial Number: 649859
Implantable Lead Serial Number: 728741
Implantable Pulse Generator Implant Date: 20170214
Lead Channel Impedance Value: 727 Ohm
Lead Channel Impedance Value: 765 Ohm
Lead Channel Pacing Threshold Amplitude: 0.7 V
Lead Channel Pacing Threshold Amplitude: 1.3 V
Lead Channel Pacing Threshold Pulse Width: 0.4 ms
Lead Channel Pacing Threshold Pulse Width: 0.4 ms
Lead Channel Sensing Intrinsic Amplitude: 25 mV
Lead Channel Sensing Intrinsic Amplitude: 4 mV
Lead Channel Setting Pacing Amplitude: 1.9 V
Lead Channel Setting Pacing Amplitude: 2 V
Lead Channel Setting Pacing Pulse Width: 0.4 ms
Lead Channel Setting Sensing Sensitivity: 2.5 mV
Pulse Gen Serial Number: 718098

## 2016-11-28 DIAGNOSIS — I2510 Atherosclerotic heart disease of native coronary artery without angina pectoris: Secondary | ICD-10-CM | POA: Diagnosis not present

## 2016-11-28 DIAGNOSIS — I5032 Chronic diastolic (congestive) heart failure: Secondary | ICD-10-CM | POA: Diagnosis not present

## 2016-11-28 DIAGNOSIS — N183 Chronic kidney disease, stage 3 (moderate): Secondary | ICD-10-CM | POA: Diagnosis not present

## 2016-11-28 DIAGNOSIS — I130 Hypertensive heart and chronic kidney disease with heart failure and stage 1 through stage 4 chronic kidney disease, or unspecified chronic kidney disease: Secondary | ICD-10-CM | POA: Diagnosis not present

## 2016-11-28 DIAGNOSIS — E039 Hypothyroidism, unspecified: Secondary | ICD-10-CM | POA: Diagnosis not present

## 2016-11-28 DIAGNOSIS — E785 Hyperlipidemia, unspecified: Secondary | ICD-10-CM | POA: Diagnosis not present

## 2016-11-28 DIAGNOSIS — D638 Anemia in other chronic diseases classified elsewhere: Secondary | ICD-10-CM | POA: Diagnosis not present

## 2016-11-28 DIAGNOSIS — G4730 Sleep apnea, unspecified: Secondary | ICD-10-CM | POA: Diagnosis not present

## 2016-11-28 DIAGNOSIS — E1129 Type 2 diabetes mellitus with other diabetic kidney complication: Secondary | ICD-10-CM | POA: Diagnosis not present

## 2016-11-30 DIAGNOSIS — Z794 Long term (current) use of insulin: Secondary | ICD-10-CM | POA: Diagnosis not present

## 2016-11-30 DIAGNOSIS — E118 Type 2 diabetes mellitus with unspecified complications: Secondary | ICD-10-CM | POA: Diagnosis not present

## 2016-12-01 ENCOUNTER — Ambulatory Visit (INDEPENDENT_AMBULATORY_CARE_PROVIDER_SITE_OTHER): Payer: Medicare HMO | Admitting: Neurology

## 2016-12-01 ENCOUNTER — Encounter: Payer: Self-pay | Admitting: Neurology

## 2016-12-01 VITALS — BP 105/69 | HR 73 | Wt 265.0 lb

## 2016-12-01 DIAGNOSIS — G40309 Generalized idiopathic epilepsy and epileptic syndromes, not intractable, without status epilepticus: Secondary | ICD-10-CM

## 2016-12-01 DIAGNOSIS — W19XXXA Unspecified fall, initial encounter: Secondary | ICD-10-CM

## 2016-12-01 DIAGNOSIS — R55 Syncope and collapse: Secondary | ICD-10-CM

## 2016-12-01 DIAGNOSIS — G40409 Other generalized epilepsy and epileptic syndromes, not intractable, without status epilepticus: Secondary | ICD-10-CM | POA: Insufficient documentation

## 2016-12-01 NOTE — Progress Notes (Signed)
PATIENT: Tara Jackson DOB: 1940/06/05  REASON FOR VISIT: follow up- headache, memory loss HISTORY FROM: patient  HISTORY OF PRESENT ILLNESS: Tara Jackson is a 76 year old female with a history of headaches and memory loss. She returns today for follow-up. She reports that nortriptyline has been beneficial or her headaches. Although she still has a headache daily. She states that the headache is always on the right side of the head. She does have photophobia but denies phonophobia. Occasionally will have nausea. She states that her headache can get severe and rates her pain as a 10 out of 10 on the pain scale. She uses Excedrin for her headaches. In the past Tylenol No. 3 has been provided however the patient is not clear if she is taking this or not. The patient reports that she has been taking one tablet of nortriptyline however the prescription was written for 2 tablets at bedtime. Patient denies any new symptoms. She returns today for an evaluation.  HISTORY 04/07/15: Tara Jackson is a 76 year old female with a history of headaches, TIA and memory loss. She returns today for follow-up. She reports that today she has a headache. She states that it started approximately 1 month ago and has been daily in nature. She rates her pain a 10 out of 10 today. She describes her discomfort as pulsatile in the temporal region. She does describe a stiff neck. Denies nausea and vomiting. Does have some photophobia. Denies phonophobia. She has tried tramadol 100 mg with no relief. In the past the patient has been on tizanidine (2014) and according to the notes she received good benefit from this medication. The patient does not recall this medication and is unsure why she is no longer taking it-- other than her prescription may have ran out. The patient does feel that her memory is worse. She states that last night she did not remember eating supper. The patient is able to complete most ADLs independently. Although she  requires assistance with putting on her shoes and stockings. Her husband also picks out her clothes for her. She no longer does much cooking because she feels dizzy at times. She does not operate a motor vehicle. She continues to do the finances however last month she had some complications however she is going to try again this month. The patient is not currently on any medication for her memory. Her BuSpar was increased at the last visit however the patient is not aware of this. Her husband is with her today at the visit. 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  HISTORY: Tara Jackson is a 76 year old African lady who developed new-onset chronic daily headaches for the last 4 weeks. She states that she had some dental extractions done of her upper molar teeth about a week ago following which she noticed worsening of her pain which predominantly is in the temporal head regions but does spread all over. She states the pain is constant, severe. At times it is pulsatile. She was seen in the emergency room at [facility] on 07/23/12. She was treated with IV Dilaudid, Phenergan, Zofran and Benadryl in no hopping on the subtotal benefit. CT scan of the maxillary region showed no evidence of abscess associated with a total extraction but showed postoperative changes with communication between the oral cavity and nasal maxillary sinus. There was evidence of sinusitis seen. CT scan of the head showed no acute abnormality. The headache is accompanied by some  nausea and light sensitivity and occasional vomiting. The headache remains unchanged throughout the day. She is currently being treated with antibiotic course for a swollen gums. She has tried oxycodone, Ultram and Tylenol which have not helped. She does also have history of degenerative cervical spine disease she does complain of neck pain and muscle tightness and spasm as well. She has had 4 minor falls since her last visit 2  months ago. She even had a brief episode of blacking out yesterday but her husband caught her. She found that her blood pressure was low and her primary care physician has reduced her blood pressure medication since then. She denies any prior history of migraine headaches of similar headaches in the past. She denies any blurred vision or loss of vision accompanying her headaches. 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   UPDATE 11/15/12 (LL): Tara Jackson comes to office for revisit for daily headache after wisdom tooth extraction. MRI cervical spine shows prominent spondylitic changes mostly at C5-6 and C4-5. with lateral disc osteophyte protrusions resulting in moderate foraminal narrowing on the right and mild canal stenosis without definite compression. MRI brain showed mild changes of microvascular ischemia. Advanced changes of chronic paranasal sinusitis. She states that her headaches are much better, about 84 % better than last visit. She does not have a headache today. She is using Tizanadine 4 mg for neck muscle tension. She had a recent sleep study which showed snoring but AHI within normal limits. Restless legs syndrome was suggested due to frequent leg movements. She states at night sometimes she feels like something is crawling on her lower legs and she feels like she must move them.   UPDATE 09/19/13 (LL): Since last visit Tara Jackson states that she has had some problems with abdominal pain and constipation. She recently went to the ER with abdominal pain and constipation times one week, despite the use of several enemas. Her headaches have not been bad in the last few months. She had a epidural pain injection in May at pain management. She denies any focal TIA or stroke symptoms, but has numerous symptom complaints on review of systems. She is concerned about leg swelling and abdominal swelling,  although her weight is up another 21 pounds since last visit. She states she has been having frequent falls without injury, but when she falls it's difficult for her to get up by herself and even difficult for her husband to get her up. She does not exercise and uses a motorized wheelchair his most of the time. Of particular concern today is an increase in hand tremor right greater than left, which is present at rest but more intense with action. It is causing her embarrassment. Update 06/03/2014 : She returns for follow-up after last visit 6 months ago. She is accompanied by husband. Patient reports new complaint of memory difficulties for the last 3 months. She states that this is mainly short-term memory. She cannot remember recent events and conversations and needs to be reminded multiple times. She often asked the same questions when she was told to answer a short while back. At times she feels that she has trouble completing sentences and can stop in mid sentence and search for what she was trying to say. She has had some episodes of hypoglycemia but feels her memory difficulties and are not related to that. She  was prescribed primidone at last visit for tremor but she did not fill the prescription but she remains on Inderal 40 mg which seems to help her tremor quite well. She was hospitalized about 4-6 weeks ago with chest pain and all workup was negative except heart rate was slow. She has long-standing history of depression and does take BuSpar 7.5 mg daily as well as Klonopin but feels her depression is not adequately treated. She has had no recurrent stroke or TIA symptoms. She remains on clopidogrel which is tolerating well without significant bleeding or bruising. She states her blood pressure and cholesterol control have been good Update 04/06/2015 : She returns for follow-up after last visit 6 months ago. She is accompanied by her husband. Patient states that in memory diminished slightly worse and  she has trouble remembering some recent information. However she can still manage to take care of her daily activities by herself. She was recently hospitalized 2 weeks ago and had a pacemaker implanted by Dr. Lovena Le. She continues to have daily headaches but off and on and not constant. The headaches vary in severity from 6/10-10/10. She takes Tylenol which seems to help. On average needs to 3 tablets a day at least. She needs to take tramadol occasionally which doesn't work as well. She has been taking Pamelor 50 mg capsule at night for her remote history of anxiety attacks. She states her blood pressure is well controlled and today it is low at 103/61. She is having trouble controlling her sugars which have fluctuated a bit she has not had any recurrent stroke or TIA symptoms. She remains on Plavix which is tolerating well without bleeding or bruising.  Update 01/12/2016 : She returns for follow-up after last visit 9 months ago. She is accompanied by husband. Patient states that she had a minor fall about 3 weeks ago and slipped and her head hit a car door. Since then she's been having daily headaches. She describes this as mainly posterior headaches which are mild to moderate in nature. She had been asked to increase her dose of Pamelor to 100 mg at night and last visit but she has not done that. She also admits to having some cognitive difficulties with decreased attention and poor short-term memory and at times having trouble remembering names and completing sentences. She has a lifelong history of depression and the past has tried multiple medications including Zoloft and Wellbutrin and Paxil but currently she is only on Klonopin which she takes for anxiety. She does participate in some stress relaxation activities like meditation daily but is not doing neck stretching exercises.She is on klonopin for anxiety but not on anything for depression. Update 12/01/2016 : she returns for follow-up after last visit  10 months ago. She is accompanied by her husband. Patient is complaining of increasing falls. She's fallen 3 times in gone to the emergency room and once she did not go She states she falls mostly backwards. She falls without warning. One fall she actually fell forwards.she has no warning and this happened suddenly and she cannot stop herself. She does not lose consciousness. She denies any chest pain and palpitations breaking out into sweats or blurred or loss of vision prior to his episodes. She was seen in the emergency room and [id number] and CT scan of the head that showed no acute abnormality. The tingling numbness in her feet which is occasionally they as well. Denies any weakness in her feet. Complains of long-standing tremors in her hands  occasionally. Past has taken propranolol which hashelped quite well.she denies any drooling of saliva, increased facial expressions short stepped gait. Or freezing while walking. REVIEW OF SYSTEMS: Out of a complete 14 system review of symptoms, the patient complains only of the following symptoms, and all other reviewed systems are negative. Fever, fatigue, chills, appetite change, cough, chest tightness, and constipation, nausea abdominal pain, difficulty urinating, memory loss, headache or passing out, back pain from without pain, skin moles and all other systems negative ALLERGIES: Allergies  Allergen Reactions  . Iodine Other (See Comments)    ONLY IV--Makes unconscious  . Iohexol Other (See Comments)     Code: SOB, Desc: cardiac arrest w/ iv contrast, has never used 13 hr prep//alice calhoun, Onset Date: [id number]   . Metoclopramide Hcl Other (See Comments)    suicidal  . Sulfa Antibiotics Hives  . Lactose Intolerance (Gi) Diarrhea  . Lansoprazole Nausea And Vomiting    HOME MEDICATIONS: Outpatient Medications Prior to Visit  Medication Sig Dispense Refill  . acetaminophen (TYLENOL) 500 MG tablet Take 500 mg by mouth every 6 (six) hours as needed  for moderate pain.     Marland Kitchen albuterol (PROVENTIL HFA;VENTOLIN HFA) 108 (90 BASE) MCG/ACT inhaler Inhale 2 puffs into the lungs every 6 (six) hours as needed for wheezing.    Marland Kitchen aspirin-acetaminophen-caffeine (EXCEDRIN MIGRAINE) 250-250-65 MG tablet Take 1 tablet by mouth every 6 (six) hours as needed for headache.    . Biotin 1 MG CAPS Take 1 mg by mouth daily as needed (supplement).    . busPIRone (BUSPAR) 10 MG tablet Take 1 tablet (10 mg total) by mouth every morning. 60 tablet 1  . Cholecalciferol (VITAMIN D-3 PO) Take 2 tablets by mouth every morning.     . clonazePAM (KLONOPIN) 0.5 MG tablet Take 0.5-1 mg by mouth 2 (two) times daily. Takes 2 tabs in am and 1 tab in pm    . clopidogrel (PLAVIX) 75 MG tablet Take 75 mg by mouth every morning.    . dicyclomine (BENTYL) 10 MG capsule Take 20 mg by mouth daily.     Marland Kitchen esomeprazole (NEXIUM) 40 MG capsule Take 40 mg by mouth daily before breakfast.    . glipiZIDE (GLUCOTROL) 10 MG tablet Take 20 mg by mouth 2 (two) times daily before a meal.     . insulin NPH (HUMULIN N,NOVOLIN N) 100 UNIT/ML injection Inject 0-20 Units into the skin See admin instructions. Novolin-N Give 20 unit in morning; give 10units at 4pm; evening dose as needed.  If less than 150 blood sugar= 0units.  If greater than 200 blood sugar= give 10units to decrease by 100 blood sugar    . insulin regular (NOVOLIN R,HUMULIN R) 100 units/mL injection Inject 0-25 Units into the skin See admin instructions. Give 25 unit in morning; give 20units at 4pm; evening dose as needed.  If less than 150 blood sugar= 0units.  If greater than 200 blood sugar= give 10units to decrease by 100 blood sugar    . ipratropium (ATROVENT) 0.03 % nasal spray Place 2 sprays into both nostrils every 12 (twelve) hours. (Patient taking differently: Place 2 sprays into both nostrils every 12 (twelve) hours as needed for rhinitis. ) 30 mL 0  . levothyroxine (SYNTHROID, LEVOTHROID) 100 MCG tablet Take 100 mcg by mouth  every morning.     . lidocaine (LIDODERM) 5 % Place 3 patches onto the skin daily as needed (pain). Remove & Discard patch within 12 hours or as directed by  MD    . LORazepam (ATIVAN) 0.5 MG tablet Take 0.5 mg by mouth daily as needed for anxiety.    . magnesium 30 MG tablet Take 30 mg by mouth daily.    . nortriptyline (PAMELOR) 50 MG capsule TAKE 2 CAPSULES (100MG ) AT BEDTIME 180 capsule 1  . Plecanatide (TRULANCE PO) Take 1 capsule by mouth daily as needed (constipation).     . polyethylene glycol (MIRALAX / GLYCOLAX) packet Take 17 g by mouth every other day.     . potassium chloride SA (K-DUR,KLOR-CON) 20 MEQ tablet Take 1 tablet (20 mEq total) by mouth 2 (two) times daily. 10 tablet 0  . solifenacin (VESICARE) 10 MG tablet Take 10 mg by mouth daily as needed (overactive bladder).     Marland Kitchen terconazole (TERAZOL 7) 0.4 % vaginal cream Place 1 applicator vaginally at bedtime as needed (for yeast infection).     . traMADol (ULTRAM) 50 MG tablet Take 1 tablet (50 mg total) by mouth every 12 (twelve) hours as needed for moderate pain. 30 tablet 0   No facility-administered medications prior to visit.     PAST MEDICAL HISTORY: Past Medical History:  Diagnosis Date  . Anemia   . Anxiety   . Arthritis   . Asthma   . Brittle bone disease   . Cataract   . Chest pain    a. minimal CAD by cath in 2014 with 40% RI stenosis. b. low-risk NST in 11/2014.  Marland Kitchen Complication of anesthesia    hard to wake up after anesthesia, trouble turning head  . Depression   . Diabetes mellitus type 2, insulin dependent ([place])   . Diabetic neuropathy ([place])   . Diastolic dysfunction, left ventricle 11/30/14   EF 55%, grade 1 DD  . DVT (deep venous thrombosis) ([place])    "18 in RLE; 7 LLE prior to PE" (03/24/2015)  . Family history of adverse reaction to anesthesia    " the whole family is hard to wake up"  . Glaucoma   . H/O hiatal hernia   . Hyperlipidemia   . Hypertension associated with diabetes ([place])   .  Hypothyroidism   . Kidney stones    "passed them"  . Memory loss 06/03/2014  . Normal cardiac stress test 11/30/14   Negative Myoview  . On home oxygen therapy    "2L oxygen concentrator @ night"  . Osteoporosis   . Oxygen desaturation during sleep    wears 2 liters of oxygen at night   . Palpitations 35years ago   Cardionet monitor - revealed mostly normal sinus rhythm, sinus bradycardia with first-degree A-V block heart rates mostly in the 50s and 60s with some 70s. No arrhythmias, PVCs or PACs noted.  . 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 Pneumonia   . Presence of permanent cardiac pacemaker   . Restless legs syndrome   . Sleep apnea    cpap disontinued; test done 07/14/2008 ordered per  Byrum  . Spinal headache   . Spinal stenosis    L 3, L 4 and L 5, and C 1, C 2 and C3  . Stroke [facility]) 04-19-2013   tia and stroke. Weakness Rt hand  . 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 s/p [employer] PPM in 03/2015 by Dr. Lovena Le secondary to sinus node dysfunction.   Marland Kitchen TIA (transient ischemic attack)  March 2014    PAST SURGICAL HISTORY: Past Surgical History:  Procedure Laterality Date  . ABDOMINAL HYSTERECTOMY    . APPENDECTOMY    . BACK SURGERY     steroid inj  . CARDIAC CATHETERIZATION  02/14/2012   no evidence of obstructive coronary disease ,low EDP with normal EF  . CATARACT EXTRACTION W/ INTRAOCULAR LENS  IMPLANT, BILATERAL Bilateral 2000s  . CHOLECYSTECTOMY    . DOPPLER ECHOCARDIOGRAPHY  2014; October 2016   a. EF 60-63%; normal diastolic pressures, no regional wall motion motion abnormalities;; b/ F 55-60%. Normal wall motion. GR 1 DD. No valve lesions  . EP IMPLANTABLE DEVICE N/A 03/24/2015   [facility] Scientific PPM, Dr. Lovena Le  . ESOPHAGOGASTRODUODENOSCOPY  08/09/2011   Procedure: ESOPHAGOGASTRODUODENOSCOPY (EGD);  Surgeon: Jeryl Columbia, MD;  Location: [facility]  END[facility];  Service: Endoscopy;  Laterality: N/A;  . ESOPHAGOGASTRODUODENOSCOPY (EGD) WITH PROPOFOL N/A 11/12/2013   Procedure: ESOPHAGOGASTRODUODENOSCOPY (EGD) WITH PROPOFOL;  Surgeon: Jeryl Columbia, MD;  Location: [facility] ENDOSCOPY;  Service: Endoscopy;  Laterality: N/A;  . ESOPHAGOGASTRODUODENOSCOPY (EGD) WITH PROPOFOL N/A 05/05/2016   Procedure: ESOPHAGOGASTRODUODENOSCOPY (EGD) WITH PROPOFOL;  Surgeon: Clarene Essex, MD;  Location: [facility] ENDOSCOPY;  Service: Endoscopy;  Laterality: N/A;  . GLAUCOMA SURGERY Bilateral 2000s   "laser"  . HOT HEMOSTASIS  08/09/2011   Procedure: HOT HEMOSTASIS (ARGON PLASMA COAGULATION/BICAP);  Surgeon: Jeryl Columbia, MD;  Location: [facility];  Service: Endoscopy;  Laterality: N/A;  . HOT HEMOSTASIS N/A 11/12/2013   Procedure: HOT HEMOSTASIS (ARGON PLASMA COAGULATION/BICAP);  Surgeon: Jeryl Columbia, MD;  Location: [facility];  Service: Endoscopy;  Laterality: N/A;  . INSERT / REPLACE / REMOVE PACEMAKER  03/24/2015  . JOINT REPLACEMENT    . KNEE SURGERY Left done with 2nd left knee replacement   spacer bar placement  . LEFT HEART CATHETERIZATION WITH CORONARY ANGIOGRAM N/A 02/14/2012   Procedure: LEFT HEART CATHETERIZATION WITH CORONARY ANGIOGRAM;  Surgeon: Leonie Man, MD;  Location[facility] CATH LAB;  Service: Cardiovascular;  Laterality: N/A;  . LEV DOPPLER  05/29/2012   RIGHT EXTREM. NORMAL VENOUS DUPLEX  . NM MYOCAR PERF WALL MOTION  01/26/2012; 11/2014   a. EF 79%,LV normal,ST SEGMENT CHANGE SUGGESTIVE OF ISCHEMIA.; LOW RISK, EF 60%. No ischemia or infarction. No wall motion abnormality   . PACEMAKER PLACEMENT    . REVISION TOTAL KNEE ARTHROPLASTY Left   . TONSILLECTOMY    . TOTAL KNEE ARTHROPLASTY Bilateral   . TRIGGER FINGER RELEASE  11/22/2011   Procedure: RELEASE TRIGGER FINGER/A-1 PULLEY;  Surgeon: Meredith Pel, MD;  Location: [place];  Service: Orthopedics;  Laterality: Left;  Left trigger thumb release  . TRIGGER FINGER RELEASE Right yrs ago    FAMILY  HISTORY: Family History  Problem Relation Age of Onset  . Cancer Mother   . Cancer - Prostate Father     SOCIAL HISTORY: Social History   Social History  . Marital status: Married    Spouse name: [name]  . Number of children: 2  . Years of education: college   Occupational History  . retired    Social History Main Topics  . Smoking status: Never Smoker  . Smokeless tobacco: Never Used  . Alcohol use No  . Drug use: No  . Sexual activity: Yes   Other Topics Concern  . Not on file   Social History Narrative   Married [name].) married 59 years.   Disabled.   Arboriculturist.   Right handed.   Caffeine two sodas  daily.          PHYSICAL EXAM  Vitals:   12/01/16 1340  BP: 105/69  Pulse: 73  Weight: 265 lb (120.2 kg)   Body mass index is 41.5 kg/m. MMSE - Mini Mental State Exam 04/12/2016 07/09/2015 10/07/2014  Orientation to time 5 5 5   Orientation to Place 5 5 5   Registration 3 3 3   Attention/ Calculation 5 5 5   Recall 3 2 3   Language- name 2 objects 2 2 2   Language- repeat 1 1 1   Language- follow 3 step command 3 3 2   Language- read & follow direction 1 1 1   Write a sentence 1 1 1   Copy design 1 1 1   Total score 30 29 29     Generalized: Well developed obese elderly African-American lady, in no acute distress,   Neurological examination  Mentation: Alert oriented to time, place, history taking. Follows all commands speech and language fluent.  Glabellar tap absent. Cranial nerve II-XII: Pupils were equal round reactive to light. Extraocular movements were full, visual field were full on confrontational test. Facial sensation and strength were normal. Uvula tongue midline. Head turning and shoulder shrug  were normal and symmetric. Motor: The motor testing reveals 5 over 5 strength LUE, LLE, RLE. 4/5 in RUE. Good symmetric motor tone is noted throughout.  Sensory: Sensory testing is intact to soft touch on all 4 extremities. No evidence of extinction is  noted. Diminished vibration sensation of her ankles and toes bilaterally. Coordination: Cerebellar testing reveals good finger-nose-finger and heel-to-shin bilaterally.  Gait and station: Patient is in a wheelchair. Reflexes: Deep tendon reflexes are symmetric and Depressed bilaterally.   DIAGNOSTIC DATA (LABS, IMAGING, TESTING) - I reviewed patient records, labs, notes, testing and imaging myself where available.  Lab Results  Component Value Date   WBC 6.0 10/24/2016   HGB 9.9 (L) 10/24/2016   HCT 31.0 (L) 10/24/2016   MCV 94.8 10/24/2016   PLT 230 10/24/2016      Component Value Date/Time   NA 142 10/26/2016 0501   K 3.6 10/26/2016 0501   CL 113 (H) 10/26/2016 0501   CO2 23 10/26/2016 0501   GLUCOSE 111 (H) 10/26/2016 0501   BUN 26 (H) 10/26/2016 0501   CREATININE 2.47 (H) 10/26/2016 0501   CREATININE 1.64 (H) 04/09/2015 1438   CALCIUM 8.1 (L) 10/26/2016 0501   PROT 6.5 06/12/2015 0620   ALBUMIN 3.3 (L) 06/12/2015 0620   AST 19 06/12/2015 0620   ALT 10 (L) 06/12/2015 0620   ALKPHOS 52 06/12/2015 0620   BILITOT 0.4 06/12/2015 0620   GFRNONAA 18 (L) 10/26/2016 0501   GFRAA 20 (L) 10/26/2016 0501    Lab Results  Component Value Date   HGBA1C 7.6 (H) 10/12/2016   Lab Results  Component Value Date   VITAMINB12 329 06/03/2014   Lab Results  Component Value Date   TSH 1.580 06/03/2014      ASSESSMENT AND PLAN 76 y.o. year old female  has a past medical history of Anemia; Anxiety; Arthritis; Asthma; Brittle bone disease; Cataract; Chest pain; Complication of anesthesia; Depression; Diabetes mellitus type 2, insulin dependent ([place]); Diabetic neuropathy ([place]); Diastolic dysfunction, left ventricle (11/30/14); DVT (deep venous thrombosis) ([place]); Family history of adverse reaction to anesthesia; Glaucoma; H/O hiatal hernia; Hyperlipidemia; Hypertension associated with diabetes ([place]); Hypothyroidism; Kidney stones; Memory loss (06/03/2014); Normal cardiac stress test  (11/30/14); On home oxygen therapy; Osteoporosis; Oxygen desaturation during sleep; Palpitations (35years ago); PE (pulmonary thromboembolism) ([facility]) (x 3,  last one was ~ 2003); Pneumonia; Presence of permanent cardiac pacemaker; Restless legs syndrome; Sleep apnea; Spinal headache; Spinal stenosis; Stroke ([place]) (04-19-2013); Symptomatic bradycardia (03/24/2015); and TIA (transient ischemic attack) (March 2014). here with:   New complaints of frequent falls over the last 2 months of unclear etiology. Possibilities include idiopathic drop attacks of the elderly versus vertebrobasilar insufficiency or peripheral neuropathy  I had a long discussion with the patient and her husband regarding her repeat episodes of falling without warning the differential diagnosis including vertebrobasilar TIA, prophylaxis, neuropathy. Recommend check EMG  Nerve conduction study and carotid and transcranial Doppler studies. I advised her to do orthostatic tolerance exercises prior to getting up. Check EEG for seizure activity. She was advised following safety prevention precautions. Return for follow-up in 3 months or call earlier if necessary. Greater than 50% time during this 25 minute visit was spent on counseling and coordination of care about her frequent fallst and depression She will return for follow-up in 3 months   Antony Contras, MD 12/01/2016, 2:30 PM [facility] [address], [place], [place] [address] [phone]

## 2016-12-01 NOTE — Patient Instructions (Addendum)
I had a long discussion with the patient and her husband regarding her repeat episodes of falling without warning the differential diagnosis including vertebrobasilar TIA, prophylaxis, neuropathy. Recommend check EMG  Nerve conduction study and carotid and transcranial Doppler studies. I advised her to do orthostatic tolerance exercises prior to getting up. Check EEG for seizure activity. She was advised following safety prevention precautions. Return for follow-up in 3 months or call earlier if necessary.  Fall Prevention in the Home Falls can cause injuries and can affect people from all age groups. There are many simple things that you can do to make your home safe and to help prevent falls. What can I do on the outside of my home?  Regularly repair the edges of walkways and driveways and fix any cracks.  Remove high doorway thresholds.  Trim any shrubbery on the main path into your home.  Use bright outdoor lighting.  Clear walkways of debris and clutter, including tools and rocks.  Regularly check that handrails are securely fastened and in good repair. Both sides of any steps should have handrails.  Install guardrails along the edges of any raised decks or porches.  Have leaves, snow, and ice cleared regularly.  Use sand or salt on walkways during winter months.  In the garage, clean up any spills right away, including grease or oil spills. What can I do in the bathroom?  Use night lights.  Install grab bars by the toilet and in the tub and shower. Do not use towel bars as grab bars.  Use non-skid mats or decals on the floor of the tub or shower.  If you need to sit down while you are in the shower, use a plastic, non-slip stool.  Keep the floor dry. Immediately clean up any water that spills on the floor.  Remove soap buildup in the tub or shower on a regular basis.  Attach bath mats securely with double-sided non-slip rug tape.  Remove throw rugs and other tripping  hazards from the floor. What can I do in the bedroom?  Use night lights.  Make sure that a bedside light is easy to reach.  Do not use oversized bedding that drapes onto the floor.  Have a firm chair that has side arms to use for getting dressed.  Remove throw rugs and other tripping hazards from the floor. What can I do in the kitchen?  Clean up any spills right away.  Avoid walking on wet floors.  Place frequently used items in easy-to-reach places.  If you need to reach for something above you, use a sturdy step stool that has a grab bar.  Keep electrical cables out of the way.  Do not use floor polish or wax that makes floors slippery. If you have to use wax, make sure that it is non-skid floor wax.  Remove throw rugs and other tripping hazards from the floor. What can I do in the stairways?  Do not leave any items on the stairs.  Make sure that there are handrails on both sides of the stairs. Fix handrails that are broken or loose. Make sure that handrails are as long as the stairways.  Check any carpeting to make sure that it is firmly attached to the stairs. Fix any carpet that is loose or worn.  Avoid having throw rugs at the top or bottom of stairways, or secure the rugs with carpet tape to prevent them from moving.  Make sure that you have a light switch at  the top of the stairs and the bottom of the stairs. If you do not have them, have them installed. What are some other fall prevention tips?  Wear closed-toe shoes that fit well and support your feet. Wear shoes that have rubber soles or low heels.  When you use a stepladder, make sure that it is completely opened and that the sides are firmly locked. Have someone hold the ladder while you are using it. Do not climb a closed stepladder.  Add color or contrast paint or tape to grab bars and handrails in your home. Place contrasting color strips on the first and last steps.  Use mobility aids as needed, such as  canes, walkers, scooters, and crutches.  Turn on lights if it is dark. Replace any light bulbs that burn out.  Set up furniture so that there are clear paths. Keep the furniture in the same spot.  Fix any uneven floor surfaces.  Choose a carpet design that does not hide the edge of steps of a stairway.  Be aware of any and all pets.  Review your medicines with your healthcare provider. Some medicines can cause dizziness or changes in blood pressure, which increase your risk of falling. Talk with your health care provider about other ways that you can decrease your risk of falls. This may include working with a physical therapist or trainer to improve your strength, balance, and endurance. This information is not intended to replace advice given to you by your health care provider. Make sure you discuss any questions you have with your health care provider. Document Released: 01/14/2002 Document Revised: 06/23/2015 Document Reviewed: 02/28/2014 Elsevier Interactive Patient Education  2017 [employer].

## 2016-12-08 ENCOUNTER — Ambulatory Visit
Admission: RE | Admit: 2016-12-08 | Discharge: 2016-12-08 | Disposition: A | Discharge status: Home / Self Care | Payer: Medicare HMO | Source: Ambulatory Visit | Attending: Gastroenterology | Admitting: Gastroenterology

## 2016-12-08 ENCOUNTER — Other Ambulatory Visit: Payer: Self-pay | Admitting: Gastroenterology

## 2016-12-08 DIAGNOSIS — R9389 Abnormal findings on diagnostic imaging of other specified body structures: Secondary | ICD-10-CM

## 2016-12-08 DIAGNOSIS — N183 Chronic kidney disease, stage 3 (moderate): Secondary | ICD-10-CM | POA: Diagnosis not present

## 2016-12-08 DIAGNOSIS — E785 Hyperlipidemia, unspecified: Secondary | ICD-10-CM | POA: Diagnosis not present

## 2016-12-08 DIAGNOSIS — E1129 Type 2 diabetes mellitus with other diabetic kidney complication: Secondary | ICD-10-CM | POA: Diagnosis not present

## 2016-12-08 DIAGNOSIS — R109 Unspecified abdominal pain: Secondary | ICD-10-CM | POA: Diagnosis not present

## 2016-12-08 DIAGNOSIS — I130 Hypertensive heart and chronic kidney disease with heart failure and stage 1 through stage 4 chronic kidney disease, or unspecified chronic kidney disease: Secondary | ICD-10-CM | POA: Diagnosis not present

## 2016-12-08 DIAGNOSIS — I5032 Chronic diastolic (congestive) heart failure: Secondary | ICD-10-CM | POA: Diagnosis not present

## 2016-12-08 DIAGNOSIS — I2510 Atherosclerotic heart disease of native coronary artery without angina pectoris: Secondary | ICD-10-CM | POA: Diagnosis not present

## 2016-12-08 DIAGNOSIS — E039 Hypothyroidism, unspecified: Secondary | ICD-10-CM | POA: Diagnosis not present

## 2016-12-08 DIAGNOSIS — G4730 Sleep apnea, unspecified: Secondary | ICD-10-CM | POA: Diagnosis not present

## 2016-12-08 DIAGNOSIS — D638 Anemia in other chronic diseases classified elsewhere: Secondary | ICD-10-CM | POA: Diagnosis not present

## 2016-12-13 ENCOUNTER — Encounter: Payer: Self-pay | Admitting: Neurology

## 2016-12-13 ENCOUNTER — Ambulatory Visit: Payer: Medicare HMO | Admitting: Neurology

## 2016-12-13 ENCOUNTER — Ambulatory Visit (INDEPENDENT_AMBULATORY_CARE_PROVIDER_SITE_OTHER): Payer: Medicare HMO | Admitting: Neurology

## 2016-12-13 DIAGNOSIS — W19XXXA Unspecified fall, initial encounter: Secondary | ICD-10-CM

## 2016-12-13 DIAGNOSIS — E1142 Type 2 diabetes mellitus with diabetic polyneuropathy: Secondary | ICD-10-CM

## 2016-12-13 DIAGNOSIS — R55 Syncope and collapse: Secondary | ICD-10-CM

## 2016-12-13 DIAGNOSIS — E1140 Type 2 diabetes mellitus with diabetic neuropathy, unspecified: Secondary | ICD-10-CM | POA: Insufficient documentation

## 2016-12-13 NOTE — Procedures (Signed)
     HISTORY:  Kimetha Trulson is a 76 year old diabetic female with a history of problems with gait instability with several falls over the last 6 months.  The patient is being evaluated for a possible peripheral neuropathy.  NERVE CONDUCTION STUDIES:  Nerve conduction studies were performed on the left upper extremity.  The distal motor latency for the left median nerve is prolonged with a normal motor amplitude.  The distal motor latency and motor amplitudes for the left ulnar nerve were normal.  The nerve conduction velocities for the left median and ulnar nerves were normal.  The sensory latency for the left median nerve was prolonged, normal for the left ulnar nerve.  The F wave latencies for the left median and ulnar nerves were prolonged.  Nerve conduction studies were performed on both lower extremities.  No response was seen for the peroneal and posterior tibial nerves bilaterally.  The sensory latencies for the peroneal nerves were unobtainable bilaterally.  EMG STUDIES:  EMG study was performed on the left lower extremity:  The tibialis anterior muscle reveals 2 to 5K motor units with decreased recruitment. No fibrillations or positive waves were seen. The peroneus tertius muscle reveals 2 to 5K motor units with decreased recruitment. No fibrillations or positive waves were seen. The medial gastrocnemius muscle reveals up to 5K motor units with decreased recruitment. No fibrillations or positive waves were seen. The vastus lateralis muscle reveals 2 to 4K motor units with full recruitment. No fibrillations or positive waves were seen. The iliopsoas muscle reveals 2 to 4K motor units with full recruitment. No fibrillations or positive waves were seen. The biceps femoris muscle (long head) reveals 2 to 4K motor units with full recruitment. No fibrillations or positive waves were seen. The lumbosacral paraspinal muscles were tested at 3 levels, and revealed no abnormalities of insertional  activity at all 3 levels tested. There was good relaxation.   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  EMG evaluation of the left lower extremity shows chronic stable distal signs of denervation consistent with a diagnosis of peripheral neuropathy.  There is no evidence of an overlying lumbosacral radiculopathy.  Jill Alexanders MD 12/13/2016 1:50 PM  [facility] [address] [place], [place] [address]  Phone [phone] Fax [phone]

## 2016-12-13 NOTE — Progress Notes (Signed)
Please refer to EMG and nerve conduction study procedure note. 

## 2016-12-14 ENCOUNTER — Other Ambulatory Visit: Payer: Self-pay | Admitting: Neurology

## 2016-12-14 DIAGNOSIS — G629 Polyneuropathy, unspecified: Secondary | ICD-10-CM

## 2016-12-15 ENCOUNTER — Ambulatory Visit: Payer: Medicare HMO | Admitting: Neurology

## 2016-12-15 ENCOUNTER — Ambulatory Visit (HOSPITAL_COMMUNITY)
Admission: RE | Admit: 2016-12-15 | Discharge: 2016-12-15 | Disposition: A | Discharge status: Home / Self Care | Payer: Medicare HMO | Source: Ambulatory Visit | Attending: Neurology | Admitting: Neurology

## 2016-12-15 ENCOUNTER — Ambulatory Visit (INDEPENDENT_AMBULATORY_CARE_PROVIDER_SITE_OTHER): Payer: Medicare HMO | Admitting: *Deleted

## 2016-12-15 DIAGNOSIS — R55 Syncope and collapse: Secondary | ICD-10-CM

## 2016-12-15 DIAGNOSIS — I495 Sick sinus syndrome: Secondary | ICD-10-CM

## 2016-12-15 NOTE — Progress Notes (Signed)
Remote pacemaker transmission.   

## 2016-12-16 ENCOUNTER — Encounter: Payer: Self-pay | Admitting: Cardiology

## 2016-12-19 NOTE — Progress Notes (Signed)
[place]    Nerve / Sites Muscle Latency Ref. Amplitude Ref. Rel Amp Segments Distance Velocity Ref. Area    ms ms mV mV %  cm m/s m/s mVms  L Median - APB     Wrist APB 4.7 ?4.4 5.5 ?4.0 100 Wrist - APB 7   17.4     Upper arm APB 9.2  5.1  94.1 Upper arm - Wrist 23 51 ?49 17.8  L Ulnar - ADM     Wrist ADM 3.4 ?3.3 7.5 ?6.0 100 Wrist - ADM 7   20.6     B.Elbow ADM 7.7  7.1  95.1 B.Elbow - Wrist 21 49 ?49 23.1     A.Elbow ADM 9.5  6.7  94.8 A.Elbow - B.Elbow 10 55 ?49 21.7         A.Elbow - Wrist      L Peroneal - EDB     Ankle EDB NR ?6.5 NR ?2.0 NR Ankle - EDB 9   NR     Fib head EDB NR  NR  NR Fib head - Ankle   ?44 NR     Pop fossa EDB NR  NR  NR Pop fossa - Fib head   ?44 NR         Pop fossa - Ankle      R Peroneal - EDB     Ankle EDB NR ?6.5 NR ?2.0 NR Ankle - EDB 9   NR     Fib head EDB NR  NR  NR Fib head - Ankle   ?44 NR     Pop fossa EDB NR  NR  NR Pop fossa - Fib head   ?44 NR         Pop fossa - Ankle      L Tibial - AH     Ankle AH NR ?5.8 NR ?4.0 NR Ankle - AH 9   NR     Pop fossa AH NR  NR  NR Pop fossa - Ankle   ?41 NR  R Tibial - AH     Ankle AH NR ?5.8 NR ?4.0 NR Ankle - AH 9   NR     Pop fossa AH NR  NR  NR Pop fossa - Ankle   ?[place]    Nerve / Sites Rec. Site Peak Lat Ref.  Amp Ref. Segments Distance    ms ms V V  cm  R Superficial peroneal - Ankle     Lat leg Ankle NR ?4.4 NR ?6 Lat leg - Ankle 14  L Superficial peroneal - Ankle     Lat leg Ankle NR ?4.4 NR ?6 Lat leg - Ankle 14         SNC    Nerve / Sites Rec. Site Peak Lat Amp Segments Distance    ms V  cm  L Median - Orthodromic (Dig II, Mid palm)     Dig II Wrist 4.6 10 Dig II - Wrist 13  L Ulnar - Orthodromic, (Dig V, Mid palm)     Dig V Wrist 3.4 18 Dig V - Wrist 26         F  Wave    Nerve F Lat Ref.   ms ms  L Median - APB 32.4 ?31.0  L Ulnar - ADM 34.6 ?32.0

## 2016-12-20 ENCOUNTER — Ambulatory Visit: Payer: Medicare HMO

## 2016-12-20 DIAGNOSIS — I129 Hypertensive chronic kidney disease with stage 1 through stage 4 chronic kidney disease, or unspecified chronic kidney disease: Secondary | ICD-10-CM | POA: Diagnosis not present

## 2016-12-20 DIAGNOSIS — G40309 Generalized idiopathic epilepsy and epileptic syndromes, not intractable, without status epilepticus: Secondary | ICD-10-CM | POA: Diagnosis not present

## 2016-12-20 DIAGNOSIS — E1140 Type 2 diabetes mellitus with diabetic neuropathy, unspecified: Secondary | ICD-10-CM | POA: Diagnosis not present

## 2016-12-20 DIAGNOSIS — G6289 Other specified polyneuropathies: Secondary | ICD-10-CM | POA: Diagnosis not present

## 2016-12-20 DIAGNOSIS — E038 Other specified hypothyroidism: Secondary | ICD-10-CM | POA: Diagnosis not present

## 2016-12-20 DIAGNOSIS — Z794 Long term (current) use of insulin: Secondary | ICD-10-CM | POA: Diagnosis not present

## 2016-12-20 DIAGNOSIS — Z950 Presence of cardiac pacemaker: Secondary | ICD-10-CM | POA: Diagnosis not present

## 2016-12-20 DIAGNOSIS — I509 Heart failure, unspecified: Secondary | ICD-10-CM | POA: Diagnosis not present

## 2016-12-20 DIAGNOSIS — R55 Syncope and collapse: Secondary | ICD-10-CM

## 2016-12-20 DIAGNOSIS — W19XXXA Unspecified fall, initial encounter: Secondary | ICD-10-CM

## 2016-12-20 DIAGNOSIS — Z6841 Body Mass Index (BMI) 40.0 and over, adult: Secondary | ICD-10-CM | POA: Diagnosis not present

## 2016-12-20 DIAGNOSIS — N183 Chronic kidney disease, stage 3 (moderate): Secondary | ICD-10-CM | POA: Diagnosis not present

## 2016-12-22 DIAGNOSIS — N179 Acute kidney failure, unspecified: Secondary | ICD-10-CM | POA: Diagnosis not present

## 2016-12-22 DIAGNOSIS — R809 Proteinuria, unspecified: Secondary | ICD-10-CM | POA: Diagnosis not present

## 2016-12-22 DIAGNOSIS — D631 Anemia in chronic kidney disease: Secondary | ICD-10-CM | POA: Diagnosis not present

## 2016-12-22 DIAGNOSIS — I129 Hypertensive chronic kidney disease with stage 1 through stage 4 chronic kidney disease, or unspecified chronic kidney disease: Secondary | ICD-10-CM | POA: Diagnosis not present

## 2016-12-22 DIAGNOSIS — I10 Essential (primary) hypertension: Secondary | ICD-10-CM | POA: Diagnosis not present

## 2016-12-22 DIAGNOSIS — E1122 Type 2 diabetes mellitus with diabetic chronic kidney disease: Secondary | ICD-10-CM | POA: Diagnosis not present

## 2016-12-22 DIAGNOSIS — I509 Heart failure, unspecified: Secondary | ICD-10-CM | POA: Diagnosis not present

## 2016-12-22 DIAGNOSIS — G4733 Obstructive sleep apnea (adult) (pediatric): Secondary | ICD-10-CM | POA: Diagnosis not present

## 2016-12-22 DIAGNOSIS — E559 Vitamin D deficiency, unspecified: Secondary | ICD-10-CM | POA: Diagnosis not present

## 2016-12-22 DIAGNOSIS — E1129 Type 2 diabetes mellitus with other diabetic kidney complication: Secondary | ICD-10-CM | POA: Diagnosis not present

## 2016-12-22 DIAGNOSIS — N184 Chronic kidney disease, stage 4 (severe): Secondary | ICD-10-CM | POA: Diagnosis not present

## 2016-12-23 LAB — CUP PACEART REMOTE DEVICE CHECK
Battery Remaining Longevity: 150 mo
Battery Remaining Percentage: 100 %
Brady Statistic RA Percent Paced: 64 %
Brady Statistic RV Percent Paced: 18 %
Date Time Interrogation Session: 20181108080200
Implantable Lead Implant Date: 20170214
Implantable Lead Implant Date: 20170214
Implantable Lead Location: 753859
Implantable Lead Location: 753860
Implantable Lead Model: 7740
Implantable Lead Model: 7741
Implantable Lead Serial Number: 649859
Implantable Lead Serial Number: 728741
Implantable Pulse Generator Implant Date: 20170214
Lead Channel Impedance Value: 720 Ohm
Lead Channel Impedance Value: 724 Ohm
Lead Channel Pacing Threshold Amplitude: 0.5 V
Lead Channel Pacing Threshold Amplitude: 1.3 V
Lead Channel Pacing Threshold Pulse Width: 0.4 ms
Lead Channel Pacing Threshold Pulse Width: 0.4 ms
Lead Channel Setting Pacing Amplitude: 1.8 V
Lead Channel Setting Pacing Amplitude: 2 V
Lead Channel Setting Pacing Pulse Width: 0.4 ms
Lead Channel Setting Sensing Sensitivity: 2.5 mV
Pulse Gen Serial Number: 718098

## 2016-12-26 ENCOUNTER — Telehealth: Payer: Self-pay

## 2016-12-26 NOTE — Telephone Encounter (Signed)
Rn call patients home number. Someone pick up, and than hung up phone [phone]. Unable to leave message. ------

## 2016-12-26 NOTE — Telephone Encounter (Signed)
-----   Message from Garvin Fila, MD sent at 12/23/2016  6:36 PM EST ----- kindly inform the patient that transcranial Doppler study shows slight narrowing of the blood vessels towards the back but normal velocities in the majority of blood vessels otherwise.  No worrisome finding.

## 2016-12-27 NOTE — Telephone Encounter (Signed)
Rn call patient about results.PT put her husband on the other end to hear results. Rn stated study shows slight narrowing of the blood vessels towards the back but normal velocities in the majority of blood vessels otherwise. No worrisome finding. Pt and her husband verbalized understanding.   Rn call patient about her EEG results. Rn stated the study shows mild irritability of the brain to the left. No definite seizure activity. Pt put husband on phone to hear results. Pt and husband verbalized understanding ------

## 2017-01-05 ENCOUNTER — Other Ambulatory Visit (HOSPITAL_COMMUNITY): Payer: Self-pay | Admitting: *Deleted

## 2017-01-05 NOTE — Discharge Instructions (Signed)

## 2017-01-06 ENCOUNTER — Ambulatory Visit (HOSPITAL_COMMUNITY)
Admission: RE | Admit: 2017-01-06 | Discharge: 2017-01-06 | Disposition: A | Discharge status: Home / Self Care | Payer: Medicare HMO | Source: Ambulatory Visit | Attending: Nephrology | Admitting: Nephrology

## 2017-01-06 DIAGNOSIS — D631 Anemia in chronic kidney disease: Secondary | ICD-10-CM | POA: Diagnosis present

## 2017-01-06 DIAGNOSIS — N189 Chronic kidney disease, unspecified: Secondary | ICD-10-CM | POA: Insufficient documentation

## 2017-01-06 MED ORDER — SODIUM CHLORIDE 0.9 % IV SOLN
510.0000 mg | INTRAVENOUS | Status: DC
  Administered 2017-01-06: 510 mg via INTRAVENOUS
  Filled 2017-01-06: qty 17

## 2017-01-13 ENCOUNTER — Ambulatory Visit (HOSPITAL_COMMUNITY)
Admission: RE | Admit: 2017-01-13 | Discharge: 2017-01-13 | Disposition: A | Discharge status: Home / Self Care | Payer: Medicare HMO | Source: Ambulatory Visit | Attending: Nephrology | Admitting: Nephrology

## 2017-01-13 DIAGNOSIS — D631 Anemia in chronic kidney disease: Secondary | ICD-10-CM | POA: Insufficient documentation

## 2017-01-13 MED ORDER — SODIUM CHLORIDE 0.9 % IV SOLN
510.0000 mg | INTRAVENOUS | Status: AC
  Administered 2017-01-13: 510 mg via INTRAVENOUS
  Filled 2017-01-13: qty 17

## 2017-01-21 DIAGNOSIS — I10 Essential (primary) hypertension: Secondary | ICD-10-CM | POA: Diagnosis not present

## 2017-01-21 DIAGNOSIS — I509 Heart failure, unspecified: Secondary | ICD-10-CM | POA: Diagnosis not present

## 2017-02-14 DIAGNOSIS — Z6841 Body Mass Index (BMI) 40.0 and over, adult: Secondary | ICD-10-CM | POA: Diagnosis not present

## 2017-02-14 DIAGNOSIS — N183 Chronic kidney disease, stage 3 (moderate): Secondary | ICD-10-CM | POA: Diagnosis not present

## 2017-02-14 DIAGNOSIS — H4089 Other specified glaucoma: Secondary | ICD-10-CM | POA: Diagnosis not present

## 2017-02-14 DIAGNOSIS — Z794 Long term (current) use of insulin: Secondary | ICD-10-CM | POA: Diagnosis not present

## 2017-02-14 DIAGNOSIS — K5901 Slow transit constipation: Secondary | ICD-10-CM | POA: Diagnosis not present

## 2017-02-14 DIAGNOSIS — E1140 Type 2 diabetes mellitus with diabetic neuropathy, unspecified: Secondary | ICD-10-CM | POA: Diagnosis not present

## 2017-02-14 DIAGNOSIS — K644 Residual hemorrhoidal skin tags: Secondary | ICD-10-CM | POA: Diagnosis not present

## 2017-02-14 DIAGNOSIS — I129 Hypertensive chronic kidney disease with stage 1 through stage 4 chronic kidney disease, or unspecified chronic kidney disease: Secondary | ICD-10-CM | POA: Diagnosis not present

## 2017-02-14 DIAGNOSIS — K5909 Other constipation: Secondary | ICD-10-CM | POA: Diagnosis not present

## 2017-02-14 DIAGNOSIS — E1149 Type 2 diabetes mellitus with other diabetic neurological complication: Secondary | ICD-10-CM | POA: Diagnosis not present

## 2017-02-14 DIAGNOSIS — D132 Benign neoplasm of duodenum: Secondary | ICD-10-CM | POA: Diagnosis not present

## 2017-02-14 DIAGNOSIS — I509 Heart failure, unspecified: Secondary | ICD-10-CM | POA: Diagnosis not present

## 2017-02-21 DIAGNOSIS — I10 Essential (primary) hypertension: Secondary | ICD-10-CM | POA: Diagnosis not present

## 2017-02-21 DIAGNOSIS — I509 Heart failure, unspecified: Secondary | ICD-10-CM | POA: Diagnosis not present

## 2017-02-28 ENCOUNTER — Ambulatory Visit (HOSPITAL_COMMUNITY)
Admission: RE | Admit: 2017-02-28 | Discharge: 2017-02-28 | Disposition: A | Discharge status: Home / Self Care | Payer: Medicare HMO | Source: Ambulatory Visit | Attending: Neurology | Admitting: Neurology

## 2017-02-28 DIAGNOSIS — R55 Syncope and collapse: Secondary | ICD-10-CM | POA: Insufficient documentation

## 2017-02-28 DIAGNOSIS — I6523 Occlusion and stenosis of bilateral carotid arteries: Secondary | ICD-10-CM | POA: Insufficient documentation

## 2017-02-28 DIAGNOSIS — E118 Type 2 diabetes mellitus with unspecified complications: Secondary | ICD-10-CM | POA: Diagnosis not present

## 2017-02-28 DIAGNOSIS — Z794 Long term (current) use of insulin: Secondary | ICD-10-CM | POA: Diagnosis not present

## 2017-02-28 NOTE — Progress Notes (Signed)
Carotid duplex prelim: 1-39% ICA stenosis.  Milarose Savich Eunice, RDMS, RVT   

## 2017-03-02 ENCOUNTER — Telehealth: Payer: Self-pay

## 2017-03-02 NOTE — Telephone Encounter (Signed)
-----   Message from Garvin Fila, MD sent at 03/01/2017  6:44 PM EST ----- Kindly let patient know that carotid doppler study showed no significant stenosis on either side and was unremarkable

## 2017-03-02 NOTE — Telephone Encounter (Signed)
Rn call patient that the carotid doppler study showed no significant stenosis on either side, was unremarkable. Pt verbalized understanding.

## 2017-03-07 ENCOUNTER — Ambulatory Visit: Payer: Medicare HMO | Admitting: Neurology

## 2017-03-07 ENCOUNTER — Encounter: Payer: Self-pay | Admitting: Neurology

## 2017-03-07 VITALS — BP 152/78 | HR 72 | Wt 280.0 lb

## 2017-03-07 DIAGNOSIS — E0849 Diabetes mellitus due to underlying condition with other diabetic neurological complication: Secondary | ICD-10-CM

## 2017-03-07 DIAGNOSIS — W19XXXD Unspecified fall, subsequent encounter: Secondary | ICD-10-CM

## 2017-03-07 NOTE — Progress Notes (Signed)
PATIENT: Tara Jackson DOB: 1940/06/05  REASON FOR VISIT: follow up- headache, memory loss HISTORY FROM: patient  HISTORY OF PRESENT ILLNESS: Tara Jackson is a 77 year old female with a history of headaches and memory loss. She returns today for follow-up. She reports that nortriptyline has been beneficial or her headaches. Although she still has a headache daily. She states that the headache is always on the right side of the head. She does have photophobia but denies phonophobia. Occasionally will have nausea. She states that her headache can get severe and rates her pain as a 10 out of 10 on the pain scale. She uses Excedrin for her headaches. In the past Tylenol No. 3 has been provided however the patient is not clear if she is taking this or not. The patient reports that she has been taking one tablet of nortriptyline however the prescription was written for 2 tablets at bedtime. Patient denies any new symptoms. She returns today for an evaluation.  HISTORY 04/07/15: Tara Jackson is a 77 year old female with a history of headaches, TIA and memory loss. She returns today for follow-up. She reports that today she has a headache. She states that it started approximately 1 month ago and has been daily in nature. She rates her pain a 10 out of 10 today. She describes her discomfort as pulsatile in the temporal region. She does describe a stiff neck. Denies nausea and vomiting. Does have some photophobia. Denies phonophobia. She has tried tramadol 100 mg with no relief. In the past the patient has been on tizanidine (2014) and according to the notes she received good benefit from this medication. The patient does not recall this medication and is unsure why she is no longer taking it-- other than her prescription may have ran out. The patient does feel that her memory is worse. She states that last night she did not remember eating supper. The patient is able to complete most ADLs independently. Although she  requires assistance with putting on her shoes and stockings. Her husband also picks out her clothes for her. She no longer does much cooking because she feels dizzy at times. She does not operate a motor vehicle. She continues to do the finances however last month she had some complications however she is going to try again this month. The patient is not currently on any medication for her memory. Her BuSpar was increased at the last visit however the patient is not aware of this. Her husband is with her today at the visit. 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  HISTORY: Tara Jackson is a 77 year old African lady who developed new-onset chronic daily headaches for the last 4 weeks. She states that she had some dental extractions done of her upper molar teeth about a week ago following which she noticed worsening of her pain which predominantly is in the temporal head regions but does spread all over. She states the pain is constant, severe. At times it is pulsatile. She was seen in the emergency room at [facility] on 07/23/12. She was treated with IV Dilaudid, Phenergan, Zofran and Benadryl in no hopping on the subtotal benefit. CT scan of the maxillary region showed no evidence of abscess associated with a total extraction but showed postoperative changes with communication between the oral cavity and nasal maxillary sinus. There was evidence of sinusitis seen. CT scan of the head showed no acute abnormality. The headache is accompanied by some  nausea and light sensitivity and occasional vomiting. The headache remains unchanged throughout the day. She is currently being treated with antibiotic course for a swollen gums. She has tried oxycodone, Ultram and Tylenol which have not helped. She does also have history of degenerative cervical spine disease she does complain of neck pain and muscle tightness and spasm as well. She has had 4 minor falls since her last visit 2  months ago. She even had a brief episode of blacking out yesterday but her husband caught her. She found that her blood pressure was low and her primary care physician has reduced her blood pressure medication since then. She denies any prior history of migraine headaches of similar headaches in the past. She denies any blurred vision or loss of vision accompanying her headaches. 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   UPDATE 11/15/12 (LL): Tara Jackson comes to office for revisit for daily headache after wisdom tooth extraction. MRI cervical spine shows prominent spondylitic changes mostly at C5-6 and C4-5. with lateral disc osteophyte protrusions resulting in moderate foraminal narrowing on the right and mild canal stenosis without definite compression. MRI brain showed mild changes of microvascular ischemia. Advanced changes of chronic paranasal sinusitis. She states that her headaches are much better, about 84 % better than last visit. She does not have a headache today. She is using Tizanadine 4 mg for neck muscle tension. She had a recent sleep study which showed snoring but AHI within normal limits. Restless legs syndrome was suggested due to frequent leg movements. She states at night sometimes she feels like something is crawling on her lower legs and she feels like she must move them.   UPDATE 09/19/13 (LL): Since last visit Tara Jackson states that she has had some problems with abdominal pain and constipation. She recently went to the ER with abdominal pain and constipation times one week, despite the use of several enemas. Her headaches have not been bad in the last few months. She had a epidural pain injection in May at pain management. She denies any focal TIA or stroke symptoms, but has numerous symptom complaints on review of systems. She is concerned about leg swelling and abdominal swelling,  although her weight is up another 21 pounds since last visit. She states she has been having frequent falls without injury, but when she falls it's difficult for her to get up by herself and even difficult for her husband to get her up. She does not exercise and uses a motorized wheelchair his most of the time. Of particular concern today is an increase in hand tremor right greater than left, which is present at rest but more intense with action. It is causing her embarrassment. Update 06/03/2014 : She returns for follow-up after last visit 6 months ago. She is accompanied by husband. Patient reports new complaint of memory difficulties for the last 3 months. She states that this is mainly short-term memory. She cannot remember recent events and conversations and needs to be reminded multiple times. She often asked the same questions when she was told to answer a short while back. At times she feels that she has trouble completing sentences and can stop in mid sentence and search for what she was trying to say. She has had some episodes of hypoglycemia but feels her memory difficulties and are not related to that. She  was prescribed primidone at last visit for tremor but she did not fill the prescription but she remains on Inderal 40 mg which seems to help her tremor quite well. She was hospitalized about 4-6 weeks ago with chest pain and all workup was negative except heart rate was slow. She has long-standing history of depression and does take BuSpar 7.5 mg daily as well as Klonopin but feels her depression is not adequately treated. She has had no recurrent stroke or TIA symptoms. She remains on clopidogrel which is tolerating well without significant bleeding or bruising. She states her blood pressure and cholesterol control have been good Update 04/06/2015 : She returns for follow-up after last visit 6 months ago. She is accompanied by her husband. Patient states that in memory diminished slightly worse and  she has trouble remembering some recent information. However she can still manage to take care of her daily activities by herself. She was recently hospitalized 2 weeks ago and had a pacemaker implanted by Dr. Lovena Le. She continues to have daily headaches but off and on and not constant. The headaches vary in severity from 6/10-10/10. She takes Tylenol which seems to help. On average needs to 3 tablets a day at least. She needs to take tramadol occasionally which doesn't work as well. She has been taking Pamelor 50 mg capsule at night for her remote history of anxiety attacks. She states her blood pressure is well controlled and today it is low at 103/61. She is having trouble controlling her sugars which have fluctuated a bit she has not had any recurrent stroke or TIA symptoms. She remains on Plavix which is tolerating well without bleeding or bruising.  Update 01/12/2016 : She returns for follow-up after last visit 9 months ago. She is accompanied by husband. Patient states that she had a minor fall about 3 weeks ago and slipped and her head hit a car door. Since then she's been having daily headaches. She describes this as mainly posterior headaches which are mild to moderate in nature. She had been asked to increase her dose of Pamelor to 100 mg at night and last visit but she has not done that. She also admits to having some cognitive difficulties with decreased attention and poor short-term memory and at times having trouble remembering names and completing sentences. She has a lifelong history of depression and the past has tried multiple medications including Zoloft and Wellbutrin and Paxil but currently she is only on Klonopin which she takes for anxiety. She does participate in some stress relaxation activities like meditation daily but is not doing neck stretching exercises.She is on klonopin for anxiety but not on anything for depression. Update 12/01/2016 : she returns for follow-up after last visit  10 months ago. She is accompanied by her husband. Patient is complaining of increasing falls. She's fallen 3 times in gone to the emergency room and once she did not go She states she falls mostly backwards. She falls without warning. One fall she actually fell forwards.she has no warning and this happened suddenly and she cannot stop herself. She does not lose consciousness. She denies any chest pain and palpitations breaking out into sweats or blurred or loss of vision prior to his episodes. She was seen in the emergency room and [id number] and CT scan of the head that showed no acute abnormality. The tingling numbness in her feet which is occasionally they as well. Denies any weakness in her feet. Complains of long-standing tremors in her hands  occasionally. Past has taken propranolol which hashelped quite well.she denies any drooling of saliva, increased facial expressions short stepped gait. Or freezing while walking. Update 03/07/2017 : she returns for follow-up after last visit 3 months ago. She is accompanied by husband. She states she is doing well and she has had no further falls since her last visit with me. She did undergo EMG nerve conduction study done by Dr. 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 Carotid ultrasound done in Dr. Lianne Moris office showed no significant extra-cranial stenosis. Transcranial Doppler study showed low mean flow velocities throughout with increased pulsatility indexes likely due to diffuse intracranial atherosclerosis. Patient states that she does walk a little bit with a walker but needs her husband to be close to her so she does not fall. She does have some burning in her feet. She takes Pamelor at night as well as tramadol when necessary as needed. She remains on Plavix which is tolerating well without bleeding or bruising. She states her blood sugars are under good control. Her blood pressure normally is quite good but today it is  elevated at 151/78. REVIEW OF SYSTEMS: Out of a complete 14 system review of symptoms, the patient complains only of the following symptoms, and all other reviewed systems are negative. Chills, ear discharge, shortness of breath, eye redness, leg swelling, palpitations, constipation, frequent walking, sleep talking, itching, weakness, memory loss, anemiaand all other systems negative ALLERGIES: Allergies  Allergen Reactions  . Iodine Other (See Comments)    ONLY IV--Makes unconscious  . Iohexol Other (See Comments)     Code: SOB, Desc: cardiac arrest w/ iv contrast, has never used 13 hr prep//alice calhoun, Onset Date: [id number]   . Metoclopramide Hcl Other (See Comments)    suicidal  . Sulfa Antibiotics Hives  . Lactose Intolerance (Gi) Diarrhea  . Lansoprazole Nausea And Vomiting    HOME MEDICATIONS: Outpatient Medications Prior to Visit  Medication Sig Dispense Refill  . acetaminophen (TYLENOL) 500 MG tablet Take 500 mg by mouth every 6 (six) hours as needed for moderate pain.     Marland Kitchen albuterol (PROVENTIL HFA;VENTOLIN HFA) 108 (90 BASE) MCG/ACT inhaler Inhale 2 puffs into the lungs every 6 (six) hours as needed for wheezing.    Marland Kitchen aspirin-acetaminophen-caffeine (EXCEDRIN MIGRAINE) 250-250-65 MG tablet Take 1 tablet by mouth every 6 (six) hours as needed for headache.    . Biotin 1 MG CAPS Take 1 mg by mouth daily as needed (supplement).    . busPIRone (BUSPAR) 10 MG tablet Take 1 tablet (10 mg total) by mouth every morning. 60 tablet 1  . Cholecalciferol (VITAMIN D-3 PO) Take 2 tablets by mouth every morning.     . clonazePAM (KLONOPIN) 0.5 MG tablet Take 0.5-1 mg by mouth 2 (two) times daily. Takes 2 tabs in am and 1 tab in pm    . clopidogrel (PLAVIX) 75 MG tablet Take 75 mg by mouth every morning.    . dicyclomine (BENTYL) 10 MG capsule Take 20 mg by mouth daily.     Marland Kitchen esomeprazole (NEXIUM) 40 MG capsule Take 40 mg by mouth daily before breakfast.    . glipiZIDE (GLUCOTROL) 10 MG  tablet Take 20 mg by mouth 2 (two) times daily before a meal.     . insulin NPH (HUMULIN N,NOVOLIN N) 100 UNIT/ML injection Inject 0-20 Units into the skin See admin instructions. Novolin-N Give 20 unit in morning; give  5 units at 4pm; evening dose as needed.  If less than 150 blood sugar= 0units.  If greater than 200 blood sugar= give 10units to decrease by 100 blood sugar    . insulin regular (NOVOLIN R,HUMULIN R) 100 units/mL injection Inject 0-25 Units into the skin See admin instructions. Give 25 unit in morning; give 20units at 4pm; evening dose as needed.  If less than 150 blood sugar= 0units.  If greater than 200 blood sugar= give 10units to decrease by 100 blood sugar    . ipratropium (ATROVENT) 0.03 % nasal spray Place 2 sprays into both nostrils every 12 (twelve) hours. (Patient taking differently: Place 2 sprays into both nostrils every 12 (twelve) hours as needed for rhinitis. ) 30 mL 0  . levothyroxine (SYNTHROID, LEVOTHROID) 100 MCG tablet Take 100 mcg by mouth every morning.     . lidocaine (LIDODERM) 5 % Place 3 patches onto the skin daily as needed (pain). Remove & Discard patch within 12 hours or as directed by MD    . LORazepam (ATIVAN) 0.5 MG tablet Take 0.5 mg by mouth daily as needed for anxiety.    . magnesium 30 MG tablet Take 30 mg by mouth daily.    . nortriptyline (PAMELOR) 50 MG capsule TAKE 2 CAPSULES (100MG ) AT BEDTIME 180 capsule 1  . Plecanatide (TRULANCE PO) Take 1 capsule by mouth daily as needed (constipation).     . polyethylene glycol (MIRALAX / GLYCOLAX) packet Take 17 g by mouth every other day.     . potassium chloride SA (K-DUR,KLOR-CON) 20 MEQ tablet Take 1 tablet (20 mEq total) by mouth 2 (two) times daily. 10 tablet 0  . solifenacin (VESICARE) 10 MG tablet Take 10 mg by mouth daily as needed (overactive bladder).     Marland Kitchen terconazole (TERAZOL 7) 0.4 % vaginal cream Place 1 applicator vaginally at bedtime as needed (for yeast infection).     . traMADol  (ULTRAM) 50 MG tablet Take 1 tablet (50 mg total) by mouth every 12 (twelve) hours as needed for moderate pain. 30 tablet 0   No facility-administered medications prior to visit.     PAST MEDICAL HISTORY: Past Medical History:  Diagnosis Date  . Anemia   . Anxiety   . Arthritis   . Asthma   . Brittle bone disease   . Cataract   . Chest pain    a. minimal CAD by cath in 2014 with 40% RI stenosis. b. low-risk NST in 11/2014.  Marland Kitchen Complication of anesthesia    hard to wake up after anesthesia, trouble turning head  . Depression   . Diabetes mellitus type 2, insulin dependent ([place])   . Diabetic neuropathy ([place])   . Diastolic dysfunction, left ventricle 11/30/14   EF 55%, grade 1 DD  . DVT (deep venous thrombosis) ([place])    "18 in RLE; 7 LLE prior to PE" (03/24/2015)  . Family history of adverse reaction to anesthesia    " the whole family is hard to wake up"  . Glaucoma   . H/O hiatal hernia   . Hyperlipidemia   . Hypertension associated with diabetes ([place])   . Hypothyroidism   . Kidney stones    "passed them"  . Memory loss 06/03/2014  . Normal cardiac stress test 11/30/14   Negative Myoview  . On home oxygen therapy    "2L oxygen concentrator @ night"  . Osteoporosis   . Oxygen desaturation during sleep    wears 2 liters of oxygen at night   . Palpitations 35years ago  Cardionet monitor - revealed mostly normal sinus rhythm, sinus bradycardia with first-degree A-V block heart rates mostly in the 50s and 60s with some 70s. No arrhythmias, PVCs or PACs noted.  . PE (pulmonary thromboembolism) ([place]) x 3, last one was ~ 2003   history of recurrent RLE DVT with PE - last PE ~>13 yrs; Maintained on Plavix  . Pneumonia   . Presence of permanent cardiac pacemaker   . Restless legs syndrome   . Sleep apnea    cpap disontinued; test done 07/14/2008 ordered per  Byrum  . Spinal headache   . Spinal stenosis    L 3, L 4 and L 5, and C 1, C 2 and C3  . Stroke [facility]) 04-19-2013   tia  and stroke. Weakness Rt hand  . 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 s/p [employer] PPM in 03/2015 by Dr. Lovena Le secondary to sinus node dysfunction.   Marland Kitchen TIA (transient ischemic attack) March 2014    PAST SURGICAL HISTORY: Past Surgical History:  Procedure Laterality Date  . ABDOMINAL HYSTERECTOMY    . APPENDECTOMY    . BACK SURGERY     steroid inj  . CARDIAC CATHETERIZATION  02/14/2012   no evidence of obstructive coronary disease ,low EDP with normal EF  . CATARACT EXTRACTION W/ INTRAOCULAR LENS  IMPLANT, BILATERAL Bilateral 2000s  . CHOLECYSTECTOMY    . DOPPLER ECHOCARDIOGRAPHY  2014; October 2016   a. EF 38-75%; normal diastolic pressures, no regional wall motion motion abnormalities;; b/ F 55-60%. Normal wall motion. GR 1 DD. No valve lesions  . EP IMPLANTABLE DEVICE N/A 03/24/2015   [facility] PPM, Dr. Lovena Le  . ESOPHAGOGASTRODUODENOSCOPY  08/09/2011   Procedure: ESOPHAGOGASTRODUODENOSCOPY (EGD);  Surgeon: Jeryl Columbia, MD;  Location: [facility];  Service: Endoscopy;  Laterality: N/A;  . ESOPHAGOGASTRODUODENOSCOPY (EGD) WITH PROPOFOL N/A 11/12/2013   Procedure: ESOPHAGOGASTRODUODENOSCOPY (EGD) WITH PROPOFOL;  Surgeon: Jeryl Columbia, MD;  Location: [facility] ENDOSCOPY;  Service: Endoscopy;  Laterality: N/A;  . ESOPHAGOGASTRODUODENOSCOPY (EGD) WITH PROPOFOL N/A 05/05/2016   Procedure: ESOPHAGOGASTRODUODENOSCOPY (EGD) WITH PROPOFOL;  Surgeon: Clarene Essex, MD;  Location: [facility] ENDOSCOPY;  Service: Endoscopy;  Laterality: N/A;  . GLAUCOMA SURGERY Bilateral 2000s   "laser"  . HOT HEMOSTASIS  08/09/2011   Procedure: HOT HEMOSTASIS (ARGON PLASMA COAGULATION/BICAP);  Surgeon: Jeryl Columbia, MD;  Location: [facility];  Service: Endoscopy;  Laterality: N/A;  . HOT HEMOSTASIS N/A 11/12/2013   Procedure: HOT HEMOSTASIS (ARGON PLASMA COAGULATION/BICAP);  Surgeon: Jeryl Columbia, MD;  Location: [facility];  Service: Endoscopy;  Laterality: N/A;  . INSERT / REPLACE / REMOVE PACEMAKER   03/24/2015  . JOINT REPLACEMENT    . KNEE SURGERY Left done with 2nd left knee replacement   spacer bar placement  . LEFT HEART CATHETERIZATION WITH CORONARY ANGIOGRAM N/A 02/14/2012   Procedure: LEFT HEART CATHETERIZATION WITH CORONARY ANGIOGRAM;  Surgeon: Leonie Man, MD;  Location[facility] CATH LAB;  Service: Cardiovascular;  Laterality: N/A;  . LEV DOPPLER  05/29/2012   RIGHT EXTREM. NORMAL VENOUS DUPLEX  . NM MYOCAR PERF WALL MOTION  01/26/2012; 11/2014   a. EF 79%,LV normal,ST SEGMENT CHANGE SUGGESTIVE OF ISCHEMIA.; LOW RISK, EF 60%. No ischemia or infarction. No wall motion abnormality   . PACEMAKER PLACEMENT    . REVISION TOTAL KNEE ARTHROPLASTY Left   . TONSILLECTOMY    . TOTAL KNEE ARTHROPLASTY Bilateral   . TRIGGER FINGER RELEASE  11/22/2011   Procedure: RELEASE TRIGGER FINGER/A-1 PULLEY;  Surgeon: Tonna Corner  Marlou Sa, MD;  Location: [place];  Service: Orthopedics;  Laterality: Left;  Left trigger thumb release  . TRIGGER FINGER RELEASE Right yrs ago    FAMILY HISTORY: Family History  Problem Relation Age of Onset  . Cancer Mother   . Cancer - Prostate Father     SOCIAL HISTORY: Social History   Socioeconomic History  . Marital status: Married    Spouse name: [name]  . Number of children: 2  . Years of education: college  . Highest education level: Not on file  Social Needs  . Financial resource strain: Not on file  . Food insecurity - worry: Not on file  . Food insecurity - inability: Not on file  . Transportation needs - medical: Not on file  . Transportation needs - non-medical: Not on file  Occupational History  . Occupation: retired  Tobacco Use  . Smoking status: Never Smoker  . Smokeless tobacco: Never Used  Substance and Sexual Activity  . Alcohol use: No    Alcohol/week: 0.0 oz  . Drug use: No  . Sexual activity: Yes  Other Topics Concern  . Not on file  Social History Narrative   Married [name].) married 67 years.   Disabled.   Arboriculturist.     Right handed.   Caffeine two sodas daily.       PHYSICAL EXAM  Vitals:   03/07/17 1434  BP: (!) 152/78  Pulse: 72  Weight: 280 lb (127 kg)   Body mass index is 46.59 kg/m. MMSE - Mini Mental State Exam 04/12/2016 07/09/2015 10/07/2014  Orientation to time 5 5 5   Orientation to Place 5 5 5   Registration 3 3 3   Attention/ Calculation 5 5 5   Recall 3 2 3   Language- name 2 objects 2 2 2   Language- repeat 1 1 1   Language- follow 3 step command 3 3 2   Language- read & follow direction 1 1 1   Write a sentence 1 1 1   Copy design 1 1 1   Total score 30 29 29     Generalized: Well developed obese elderly African-American lady, in no acute distress,   Neurological examination  Mentation: Alert oriented to time, place, history taking. Follows all commands speech and language fluent.  Glabellar tap absent. Cranial nerve II-XII: Pupils were equal round reactive to light. Extraocular movements were full, visual field were full on confrontational test. Facial sensation and strength were normal. Uvula tongue midline. Head turning and shoulder shrug  were normal and symmetric. Motor: The motor testing reveals 5 over 5 strength LUE, LLE, RLE. 4/5 in RUE. Good symmetric motor tone is noted throughout.  Sensory: Sensory testing is intact to soft touch on all 4 extremities. No evidence of extinction is noted. Diminished vibration sensation of her ankles and toes bilaterally. Coordination: Cerebellar testing reveals good finger-nose-finger and heel-to-shin bilaterally.  Gait and station: Patient is in a wheelchair.needs two-person help to get up. Walks with a cautious broad based gait Reflexes: Deep tendon reflexes are symmetric and Depressed bilaterally.   DIAGNOSTIC DATA (LABS, IMAGING, TESTING) - I reviewed patient records, labs, notes, testing and imaging myself where available.  Lab Results  Component Value Date   WBC 6.0 10/24/2016   HGB 9.9 (L) 10/24/2016   HCT 31.0 (L) 10/24/2016   MCV  94.8 10/24/2016   PLT 230 10/24/2016      Component Value Date/Time   NA 142 10/26/2016 0501   K 3.6 10/26/2016 0501   CL 113 (H) 10/26/2016 0501  CO2 23 10/26/2016 0501   GLUCOSE 111 (H) 10/26/2016 0501   BUN 26 (H) 10/26/2016 0501   CREATININE 2.47 (H) 10/26/2016 0501   CREATININE 1.64 (H) 04/09/2015 1438   CALCIUM 8.1 (L) 10/26/2016 0501   PROT 6.5 06/12/2015 0620   ALBUMIN 3.3 (L) 06/12/2015 0620   AST 19 06/12/2015 0620   ALT 10 (L) 06/12/2015 0620   ALKPHOS 52 06/12/2015 0620   BILITOT 0.4 06/12/2015 0620   GFRNONAA 18 (L) 10/26/2016 0501   GFRAA 20 (L) 10/26/2016 0501    Lab Results  Component Value Date   HGBA1C 7.6 (H) 10/12/2016   Lab Results  Component Value Date   VITAMINB12 329 06/03/2014   Lab Results  Component Value Date   TSH 1.580 06/03/2014      ASSESSMENT AND PLAN 77 y.o. year old female  has a past medical history of Anemia, Anxiety, Arthritis, Asthma, Brittle bone disease, Cataract, Chest pain, Complication of anesthesia, Depression, Diabetes mellitus type 2, insulin dependent ([place]), Diabetic neuropathy ([place]), Diastolic dysfunction, left ventricle (11/30/14), DVT (deep venous thrombosis) ([place]), Family history of adverse reaction to anesthesia, Glaucoma, H/O hiatal hernia, Hyperlipidemia, Hypertension associated with diabetes ([place]), Hypothyroidism, Kidney stones, Memory loss (06/03/2014), Normal cardiac stress test (11/30/14), On home oxygen therapy, Osteoporosis, Oxygen desaturation during sleep, Palpitations (35years ago), 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 here with:  Persistent but improved frequent falls over the last 6 months of unclear etiology. Possibilities include idiopathic drop  attacks of the elderly versus vertebrobasilar insufficiency or peripheral neuropathy I had a long discussion with the patient and her husband regarding her unexplained falls and reviewed results of the imaging studies and EMG. I recommend the check neuropathy panel labs and recommend tight control of diabetes to prevent her neuropathy from getting worse. Continue Pamelor at night and Ultram when necessary for neuropathic pain. She was advised to use a walker at all times and fall and safety precautions. No routine scheduled appointment with me is necessary. Follow-up with primary care physician. Greater than 50% time during this 25 minute visit was spent on counseling and coordination of care about her frequent fallst and depression She will return for follow-up in 3 months   Antony Contras, MD 03/07/2017, 3:56 PM [facility] [address], [place], [place] [address] [phone]

## 2017-03-07 NOTE — Patient Instructions (Signed)
I had a long discussion with the patient and her husband regarding her unexplained falls and reviewed results of the imaging studies and EMG. I recommend the check neuropathy panel labs and recommend tight control of diabetes to prevent her neuropathy from getting worse. Continue Pamelor at night and Ultram when necessary for neuropathic pain. She was advised to use a walker at all times and fall and safety precautions. No routine scheduled appointment with me is necessary. Follow-up with primary care physician.

## 2017-03-09 LAB — HEMOGLOBIN A1C
Est. average glucose Bld gHb Est-mCnc: 180 mg/dL
Hgb A1c MFr Bld: 7.9 % — ABNORMAL HIGH (ref 4.8–5.6)

## 2017-03-09 LAB — NEUROPATHY PANEL
A/G Ratio: 1.1 (ref 0.7–1.7)
Albumin ELP: 3.6 g/dL (ref 2.9–4.4)
Alpha 1: 0.3 g/dL (ref 0.0–0.4)
Alpha 2: 1.1 g/dL — ABNORMAL HIGH (ref 0.4–1.0)
Angio Convert Enzyme: 56 U/L (ref 14–82)
Anti Nuclear Antibody(ANA): POSITIVE — AB
Beta: 1.2 g/dL (ref 0.7–1.3)
Gamma Globulin: 0.8 g/dL (ref 0.4–1.8)
Globulin, Total: 3.4 g/dL (ref 2.2–3.9)
Rhuematoid fact SerPl-aCnc: 10.2 IU/mL (ref 0.0–13.9)
Sed Rate: 34 mm/hr (ref 0–40)
TSH: 0.213 u[IU]/mL — ABNORMAL LOW (ref 0.450–4.500)
Total Protein: 7 g/dL (ref 6.0–8.5)
Vit D, 25-Hydroxy: 67.6 ng/mL (ref 30.0–100.0)
Vitamin B-12: 407 pg/mL (ref 232–1245)

## 2017-03-14 ENCOUNTER — Telehealth: Payer: Self-pay

## 2017-03-14 NOTE — Telephone Encounter (Signed)
-----   Message from Garvin Fila, MD sent at 03/10/2017 12:33 PM EST ----- Kindly inform the patient that lab work was mostly normal except for hyperactive thyroid and positive ANA and she needs to see her primary physician Dr. Virgina Jock to discuss treatment for this

## 2017-03-14 NOTE — Telephone Encounter (Signed)
I spoke with patient and reviewed the results with her, she then asked me to speak with her husband and review them again with him and I did. They both voiced understanding and appreciation and will contact her PCP for further advice. I will forward these results to Dr. Virgina Jock.

## 2017-03-16 ENCOUNTER — Ambulatory Visit (INDEPENDENT_AMBULATORY_CARE_PROVIDER_SITE_OTHER): Payer: Medicare HMO | Admitting: *Deleted

## 2017-03-16 DIAGNOSIS — I495 Sick sinus syndrome: Secondary | ICD-10-CM | POA: Diagnosis not present

## 2017-03-16 NOTE — Progress Notes (Signed)
Remote pacemaker transmission.   

## 2017-03-22 ENCOUNTER — Encounter: Payer: Self-pay | Admitting: Cardiology

## 2017-03-24 DIAGNOSIS — I10 Essential (primary) hypertension: Secondary | ICD-10-CM | POA: Diagnosis not present

## 2017-03-24 DIAGNOSIS — I509 Heart failure, unspecified: Secondary | ICD-10-CM | POA: Diagnosis not present

## 2017-04-11 DIAGNOSIS — E1129 Type 2 diabetes mellitus with other diabetic kidney complication: Secondary | ICD-10-CM | POA: Diagnosis not present

## 2017-04-11 DIAGNOSIS — E785 Hyperlipidemia, unspecified: Secondary | ICD-10-CM | POA: Diagnosis not present

## 2017-04-11 DIAGNOSIS — E039 Hypothyroidism, unspecified: Secondary | ICD-10-CM | POA: Diagnosis not present

## 2017-04-11 DIAGNOSIS — D631 Anemia in chronic kidney disease: Secondary | ICD-10-CM | POA: Diagnosis not present

## 2017-04-11 DIAGNOSIS — N184 Chronic kidney disease, stage 4 (severe): Secondary | ICD-10-CM | POA: Diagnosis not present

## 2017-04-11 DIAGNOSIS — N189 Chronic kidney disease, unspecified: Secondary | ICD-10-CM | POA: Diagnosis not present

## 2017-04-11 DIAGNOSIS — E1122 Type 2 diabetes mellitus with diabetic chronic kidney disease: Secondary | ICD-10-CM | POA: Diagnosis not present

## 2017-04-11 DIAGNOSIS — Z950 Presence of cardiac pacemaker: Secondary | ICD-10-CM | POA: Diagnosis not present

## 2017-04-11 DIAGNOSIS — I129 Hypertensive chronic kidney disease with stage 1 through stage 4 chronic kidney disease, or unspecified chronic kidney disease: Secondary | ICD-10-CM | POA: Diagnosis not present

## 2017-04-11 DIAGNOSIS — I509 Heart failure, unspecified: Secondary | ICD-10-CM | POA: Diagnosis not present

## 2017-04-11 DIAGNOSIS — I2510 Atherosclerotic heart disease of native coronary artery without angina pectoris: Secondary | ICD-10-CM | POA: Diagnosis not present

## 2017-04-11 DIAGNOSIS — G459 Transient cerebral ischemic attack, unspecified: Secondary | ICD-10-CM | POA: Diagnosis not present

## 2017-04-12 LAB — CUP PACEART REMOTE DEVICE CHECK
Battery Remaining Longevity: 144 mo
Battery Remaining Percentage: 100 %
Brady Statistic RA Percent Paced: 65 %
Brady Statistic RV Percent Paced: 35 %
Date Time Interrogation Session: 20190207080100
Implantable Lead Implant Date: 20170214
Implantable Lead Implant Date: 20170214
Implantable Lead Location: 753859
Implantable Lead Location: 753860
Implantable Lead Model: 7740
Implantable Lead Model: 7741
Implantable Lead Serial Number: 649859
Implantable Lead Serial Number: 728741
Implantable Pulse Generator Implant Date: 20170214
Lead Channel Impedance Value: 687 Ohm
Lead Channel Impedance Value: 749 Ohm
Lead Channel Pacing Threshold Amplitude: 0.5 V
Lead Channel Pacing Threshold Amplitude: 1.4 V
Lead Channel Pacing Threshold Pulse Width: 0.4 ms
Lead Channel Pacing Threshold Pulse Width: 0.4 ms
Lead Channel Setting Pacing Amplitude: 1.9 V
Lead Channel Setting Pacing Amplitude: 2 V
Lead Channel Setting Pacing Pulse Width: 0.4 ms
Lead Channel Setting Sensing Sensitivity: 2.5 mV
Pulse Gen Serial Number: 718098

## 2017-04-14 DIAGNOSIS — Z1212 Encounter for screening for malignant neoplasm of rectum: Secondary | ICD-10-CM | POA: Diagnosis not present

## 2017-04-18 DIAGNOSIS — Z794 Long term (current) use of insulin: Secondary | ICD-10-CM | POA: Diagnosis not present

## 2017-04-18 DIAGNOSIS — Z6841 Body Mass Index (BMI) 40.0 and over, adult: Secondary | ICD-10-CM | POA: Diagnosis not present

## 2017-04-18 DIAGNOSIS — E1149 Type 2 diabetes mellitus with other diabetic neurological complication: Secondary | ICD-10-CM | POA: Diagnosis not present

## 2017-04-18 DIAGNOSIS — E1140 Type 2 diabetes mellitus with diabetic neuropathy, unspecified: Secondary | ICD-10-CM | POA: Diagnosis not present

## 2017-04-18 DIAGNOSIS — Z0000 Encounter for general adult medical examination without abnormal findings: Secondary | ICD-10-CM | POA: Diagnosis not present

## 2017-04-18 DIAGNOSIS — E11319 Type 2 diabetes mellitus with unspecified diabetic retinopathy without macular edema: Secondary | ICD-10-CM | POA: Diagnosis not present

## 2017-04-18 DIAGNOSIS — R69 Illness, unspecified: Secondary | ICD-10-CM | POA: Diagnosis not present

## 2017-04-18 DIAGNOSIS — H4089 Other specified glaucoma: Secondary | ICD-10-CM | POA: Diagnosis not present

## 2017-04-18 DIAGNOSIS — I509 Heart failure, unspecified: Secondary | ICD-10-CM | POA: Diagnosis not present

## 2017-04-21 DIAGNOSIS — I10 Essential (primary) hypertension: Secondary | ICD-10-CM | POA: Diagnosis not present

## 2017-04-21 DIAGNOSIS — I509 Heart failure, unspecified: Secondary | ICD-10-CM | POA: Diagnosis not present

## 2017-05-05 DIAGNOSIS — I129 Hypertensive chronic kidney disease with stage 1 through stage 4 chronic kidney disease, or unspecified chronic kidney disease: Secondary | ICD-10-CM | POA: Diagnosis not present

## 2017-05-18 DIAGNOSIS — M810 Age-related osteoporosis without current pathological fracture: Secondary | ICD-10-CM | POA: Diagnosis not present

## 2017-05-22 DIAGNOSIS — I509 Heart failure, unspecified: Secondary | ICD-10-CM | POA: Diagnosis not present

## 2017-05-22 DIAGNOSIS — I10 Essential (primary) hypertension: Secondary | ICD-10-CM | POA: Diagnosis not present

## 2017-05-30 DIAGNOSIS — Z794 Long term (current) use of insulin: Secondary | ICD-10-CM | POA: Diagnosis not present

## 2017-05-30 DIAGNOSIS — E118 Type 2 diabetes mellitus with unspecified complications: Secondary | ICD-10-CM | POA: Diagnosis not present

## 2017-06-01 ENCOUNTER — Ambulatory Visit: Payer: Medicare HMO | Admitting: Cardiology

## 2017-06-12 ENCOUNTER — Emergency Department (HOSPITAL_COMMUNITY): Payer: Medicare HMO

## 2017-06-12 ENCOUNTER — Emergency Department (HOSPITAL_COMMUNITY)
Admission: EM | Admit: 2017-06-12 | Discharge: 2017-06-12 | Disposition: A | Discharge status: Home / Self Care | Payer: Medicare HMO | Attending: Emergency Medicine | Admitting: Emergency Medicine

## 2017-06-12 ENCOUNTER — Encounter (HOSPITAL_COMMUNITY): Payer: Self-pay

## 2017-06-12 ENCOUNTER — Emergency Department (HOSPITAL_BASED_OUTPATIENT_CLINIC_OR_DEPARTMENT_OTHER): Payer: Medicare HMO

## 2017-06-12 ENCOUNTER — Encounter: Payer: Self-pay | Admitting: Cardiology

## 2017-06-12 ENCOUNTER — Other Ambulatory Visit: Payer: Self-pay

## 2017-06-12 ENCOUNTER — Ambulatory Visit (INDEPENDENT_AMBULATORY_CARE_PROVIDER_SITE_OTHER): Payer: Medicare HMO | Admitting: Cardiology

## 2017-06-12 VITALS — BP 134/64 | HR 70 | Ht 67.0 in | Wt 266.0 lb

## 2017-06-12 DIAGNOSIS — M25551 Pain in right hip: Secondary | ICD-10-CM | POA: Diagnosis not present

## 2017-06-12 DIAGNOSIS — Z79899 Other long term (current) drug therapy: Secondary | ICD-10-CM | POA: Insufficient documentation

## 2017-06-12 DIAGNOSIS — M79604 Pain in right leg: Secondary | ICD-10-CM

## 2017-06-12 DIAGNOSIS — R0789 Other chest pain: Secondary | ICD-10-CM | POA: Diagnosis not present

## 2017-06-12 DIAGNOSIS — E1140 Type 2 diabetes mellitus with diabetic neuropathy, unspecified: Secondary | ICD-10-CM | POA: Diagnosis not present

## 2017-06-12 DIAGNOSIS — I5032 Chronic diastolic (congestive) heart failure: Secondary | ICD-10-CM | POA: Diagnosis not present

## 2017-06-12 DIAGNOSIS — R0602 Shortness of breath: Secondary | ICD-10-CM | POA: Insufficient documentation

## 2017-06-12 DIAGNOSIS — I129 Hypertensive chronic kidney disease with stage 1 through stage 4 chronic kidney disease, or unspecified chronic kidney disease: Secondary | ICD-10-CM | POA: Diagnosis not present

## 2017-06-12 DIAGNOSIS — Z96653 Presence of artificial knee joint, bilateral: Secondary | ICD-10-CM | POA: Diagnosis not present

## 2017-06-12 DIAGNOSIS — M25473 Effusion, unspecified ankle: Secondary | ICD-10-CM | POA: Diagnosis not present

## 2017-06-12 DIAGNOSIS — I10 Essential (primary) hypertension: Secondary | ICD-10-CM

## 2017-06-12 DIAGNOSIS — R42 Dizziness and giddiness: Secondary | ICD-10-CM | POA: Insufficient documentation

## 2017-06-12 DIAGNOSIS — R079 Chest pain, unspecified: Secondary | ICD-10-CM | POA: Diagnosis not present

## 2017-06-12 DIAGNOSIS — N183 Chronic kidney disease, stage 3 (moderate): Secondary | ICD-10-CM | POA: Insufficient documentation

## 2017-06-12 DIAGNOSIS — Z950 Presence of cardiac pacemaker: Secondary | ICD-10-CM | POA: Diagnosis not present

## 2017-06-12 DIAGNOSIS — E1122 Type 2 diabetes mellitus with diabetic chronic kidney disease: Secondary | ICD-10-CM | POA: Insufficient documentation

## 2017-06-12 DIAGNOSIS — R609 Edema, unspecified: Secondary | ICD-10-CM

## 2017-06-12 DIAGNOSIS — I495 Sick sinus syndrome: Secondary | ICD-10-CM | POA: Diagnosis not present

## 2017-06-12 HISTORY — DX: Chronic kidney disease, unspecified: N18.9

## 2017-06-12 LAB — BASIC METABOLIC PANEL
Anion gap: 12 (ref 5–15)
BUN: 27 mg/dL — ABNORMAL HIGH (ref 6–20)
CO2: 24 mmol/L (ref 22–32)
Calcium: 9.1 mg/dL (ref 8.9–10.3)
Chloride: 101 mmol/L (ref 101–111)
Creatinine, Ser: 1.79 mg/dL — ABNORMAL HIGH (ref 0.44–1.00)
GFR calc Af Amer: 30 mL/min — ABNORMAL LOW (ref >60)
GFR calc non Af Amer: 26 mL/min — ABNORMAL LOW (ref >60)
Glucose, Bld: 368 mg/dL — ABNORMAL HIGH (ref 65–99)
Potassium: 4.2 mmol/L (ref 3.5–5.1)
Sodium: 137 mmol/L (ref 135–145)

## 2017-06-12 LAB — HEPATIC FUNCTION PANEL
ALT: 11 U/L — ABNORMAL LOW (ref 14–54)
AST: 18 U/L (ref 15–41)
Albumin: 3.6 g/dL (ref 3.5–5.0)
Alkaline Phosphatase: 65 U/L (ref 38–126)
Bilirubin, Direct: 0.1 mg/dL (ref 0.1–0.5)
Indirect Bilirubin: 0.4 mg/dL (ref 0.3–0.9)
Total Bilirubin: 0.5 mg/dL (ref 0.3–1.2)
Total Protein: 7.2 g/dL (ref 6.5–8.1)

## 2017-06-12 LAB — BRAIN NATRIURETIC PEPTIDE: B Natriuretic Peptide: 19.8 pg/mL (ref 0.0–100.0)

## 2017-06-12 LAB — PROTIME-INR
INR: 0.95
Prothrombin Time: 12.6 seconds (ref 11.4–15.2)

## 2017-06-12 LAB — CBC
HCT: 35.6 % — ABNORMAL LOW (ref 36.0–46.0)
Hemoglobin: 11.5 g/dL — ABNORMAL LOW (ref 12.0–15.0)
MCH: 31.7 pg (ref 26.0–34.0)
MCHC: 32.3 g/dL (ref 30.0–36.0)
MCV: 98.1 fL (ref 78.0–100.0)
Platelets: 279 10*3/uL (ref 150–400)
RBC: 3.63 MIL/uL — ABNORMAL LOW (ref 3.87–5.11)
RDW: 12.1 % (ref 11.5–15.5)
WBC: 6.7 10*3/uL (ref 4.0–10.5)

## 2017-06-12 LAB — I-STAT TROPONIN, ED: Troponin i, poc: 0.01 ng/mL (ref 0.00–0.08)

## 2017-06-12 MED ORDER — ACETAMINOPHEN 500 MG PO TABS
1000.0000 mg | ORAL_TABLET | Freq: Once | ORAL | Status: AC
  Administered 2017-06-12: 1000 mg via ORAL
  Filled 2017-06-12: qty 2

## 2017-06-12 MED ORDER — IRBESARTAN 75 MG PO TABS: 75.0000 mg | ORAL_TABLET | Freq: Every day | ORAL | 3 refills | Status: DC

## 2017-06-12 MED ORDER — TRAMADOL HCL 50 MG PO TABS
100.0000 mg | ORAL_TABLET | Freq: Once | ORAL | Status: AC
Start: 2017-06-12 — End: 2017-06-12
  Administered 2017-06-12: 100 mg via ORAL
  Filled 2017-06-12: qty 2

## 2017-06-12 MED ORDER — FUROSEMIDE 20 MG PO TABS: 20.0000 mg | ORAL_TABLET | Freq: Every day | ORAL | 3 refills | Status: DC

## 2017-06-12 MED ORDER — TRAMADOL HCL 50 MG PO TABS: ORAL_TABLET | ORAL | 0 refills | Status: DC

## 2017-06-12 MED ORDER — TECHNETIUM TO 99M ALBUMIN AGGREGATED
4.8000 | Freq: Once | INTRAVENOUS | Status: AC | PRN
Start: 2017-06-12 — End: 2017-06-12
  Administered 2017-06-12: 4.8 via INTRAVENOUS

## 2017-06-12 MED ORDER — ACETAMINOPHEN 500 MG PO TABS: 1000.0000 mg | ORAL_TABLET | Freq: Four times a day (QID) | ORAL | 0 refills | Status: DC | PRN

## 2017-06-12 MED ORDER — TECHNETIUM TC 99M DIETHYLENETRIAME-PENTAACETIC ACID
30.3000 | Freq: Once | INTRAVENOUS | Status: AC | PRN
  Administered 2017-06-12: 30.3 via INTRAVENOUS

## 2017-06-12 NOTE — ED Triage Notes (Signed)
EKG given to DR. Bland Span. Pt is breathing non labored and is able to speak in full sentence.

## 2017-06-12 NOTE — ED Provider Notes (Signed)
Patient accepted at signout from Dr. Ellender Hose.  Patient had prior history of DVT PE.  Pending studies for review or ultrasound lower extremity and VQ scan.  If negative, plan for treatment for likely musculoskeletal type pain.  Both studies have returned negative.  She reports her main concern coming in today was to make sure that she did not have a blood clot.  Reports he has some pain in her right hip/groin area.  Patient is alert and nontoxic.  No respiratory distress.  Clear mental status.  She has taken tramadol in the past successfully without adverse reaction.  Will prescribe combination of Tylenol and acetaminophen for pain control.  Patient is counseled on close follow-up with her primary care doctor.   Charlesetta Shanks, MD 06/12/17 [phone]

## 2017-06-12 NOTE — ED Provider Notes (Signed)
[facility] DEPT Provider Note   CSN: [id number] Arrival date & time: 06/12/17  1252     History   Chief Complaint Chief Complaint  Patient presents with  . Chest Pain  . Back Pain    HPI Tara Jackson is a 77 y.o. female.  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  The patient states that over the last week, she is had a sharp, stabbing, right groin pain.  She thought that this was due to a recent fall, but she is also noticed some increased swelling.  Over the last day, she is also noticed a pressure-like pain in her right chest that is slightly worse with deep inspiration.  She noticed that she feels short of breath at rest as well.  This is similar to her previous pain with episodes of pulmonary emboli.  She is been taking Plavix which she states she takes for her blood clots.  She does not have an IVC filter.  Denies any fevers.  She does note that she has had a "cold" for the last several days and has been coughing with yellow-green sputum.  No fevers.  No nausea or vomiting.  No abdominal pain.  Past Medical History:  Diagnosis Date  . Anemia   . Anxiety   . Arthritis   . Asthma   . Brittle bone disease   . Cataract   . CKD (chronic kidney disease)   . Complication of anesthesia    hard to wake up after anesthesia, trouble turning head  . Coronary artery disease, non-occlusive 2014   a. minimal CAD by cath in 2014 with 40% RI stenosis. b. low-risk NST in 11/2014.  Marland Kitchen Depression   . Diabetes mellitus type 2, insulin dependent ([place])   . Diabetic neuropathy ([place])   . Diastolic dysfunction, left ventricle 11/30/14   EF 55%, grade 1 DD  . DVT (deep venous thrombosis) ([place])    "18 in RLE; 7 LLE prior to PE" (03/24/2015)  . Family history of adverse reaction to anesthesia    " the whole family is hard to wake up"  .  Glaucoma   . H/O hiatal hernia   . Hyperlipidemia   . Hypertension associated with diabetes ([place])   . Hypothyroidism   . Kidney stones    "passed them"  . Memory loss 06/03/2014  . On home oxygen therapy    "2L oxygen concentrator @ night"  . Osteoporosis   . Oxygen desaturation during sleep    wears 2 liters of oxygen at night   . Palpitations 35years ago   Cardionet monitor - revealed mostly normal sinus rhythm, sinus bradycardia with first-degree A-V block heart rates mostly in the 50s and 60s with some 70s. No arrhythmias, PVCs or PACs noted.  . PE (pulmonary thromboembolism) ([place]) x 3, last one was ~ 2003   history of recurrent RLE DVT with PE - last PE ~>13 yrs; Maintained on Plavix  . Pneumonia   . Presence of permanent cardiac pacemaker   . Restless legs syndrome   . Sleep apnea    cpap disontinued; test done 07/14/2008 ordered per  [place]  . Spinal headache   . Spinal stenosis    L 3, L 4 and L 5, and C 1, C 2 and C3  . Stroke [facility]) 04-19-2013   tia and stroke. Weakness  Rt hand  . 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 s/p [employer] PPM in 03/2015 by Dr. Lovena Le secondary to sinus node dysfunction.   Marland Kitchen TIA (transient ischemic attack) March 2014    Patient Active Problem List   Diagnosis Date Noted  . Orthostatic dizziness 06/12/2017  . Diabetic neuropathy ([place]) 12/13/2016  . 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 Acute renal failure superimposed on chronic kidney disease ([place]) 10/24/2016  . Syncope 10/23/2016  . Chronic anemia 10/11/2016  . Pacemaker 04/26/2015  . Chronic daily headache 04/07/2015  . Symptomatic bradycardia 03/24/2015  . Sinus node dysfunction ([place]) 03/24/2015  . Chronic diastolic heart failure ([place])   . Diabetes mellitus type 2 in obese ([place])   . Normal coronary arteries 01/14/2015  . Normal cardiac stress test 11/30/2014  . Bradycardia 11/28/2014  . Type 2 diabetes mellitus with renal manifestations ([place]) 11/28/2014  . Morbid  obesity ([place]) 11/28/2014  . CKD stage 3 due to type 2 diabetes mellitus ([place]) 11/28/2014  . Memory loss 06/03/2014  . AKI (acute kidney injury) ([place])   . Chest pain 04/19/2014  . Pleuritic chest pain 04/19/2014  . Diabetes mellitus ([place]) 04/19/2014  . Edema of both legs 04/19/2014  . Dyspnea   . SOB (shortness of breath)   . Tremor of right hand 09/20/2013  . Headache(784.0) 09/19/2013  . Restless legs syndrome (RLS) 09/19/2013  . Constipation due to pain medication 08/21/2013  . Ankle edema 08/21/2013  . PVC's (premature ventricular contractions) 02/10/2013  . Diastolic dysfunction, left ventricle   . 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 PULMONARY EMBOLISM, HX OF 10/02/2009  . ALLERGIC RHINITIS 04/07/2009  . Sleep apnea 07/09/2008  . Hyperlipidemia with target LDL less than 130 05/20/2008  . Essential hypertension 05/20/2008    Past Surgical History:  Procedure Laterality Date  . ABDOMINAL HYSTERECTOMY    . APPENDECTOMY    . BACK SURGERY     steroid inj  . CARDIAC CATHETERIZATION  02/14/2012   no evidence of obstructive coronary disease ,low EDP with normal EF  . CATARACT EXTRACTION W/ INTRAOCULAR LENS  IMPLANT, BILATERAL Bilateral 2000s  . CHOLECYSTECTOMY    . DOPPLER ECHOCARDIOGRAPHY  2014; October 2016   a. EF 67-61%; normal diastolic pressures, no regional wall motion motion abnormalities;; b/ F 55-60%. Normal wall motion. GR 1 DD. No valve lesions  . EP IMPLANTABLE DEVICE N/A 03/24/2015   [facility] [employer] PPM, Dr. Lovena Le  . ESOPHAGOGASTRODUODENOSCOPY  08/09/2011   Procedure: ESOPHAGOGASTRODUODENOSCOPY (EGD);  Surgeon: Jeryl Columbia, MD;  Location: [facility];  Service: Endoscopy;  Laterality: N/A;  . ESOPHAGOGASTRODUODENOSCOPY (EGD) WITH PROPOFOL N/A 11/12/2013   Procedure: ESOPHAGOGASTRODUODENOSCOPY (EGD) WITH PROPOFOL;  Surgeon: Jeryl Columbia, MD;  Location: [facility] ENDOSCOPY;  Service: Endoscopy;  Laterality: N/A;  . ESOPHAGOGASTRODUODENOSCOPY (EGD) WITH PROPOFOL N/A 05/05/2016     Procedure: ESOPHAGOGASTRODUODENOSCOPY (EGD) WITH PROPOFOL;  Surgeon: Clarene Essex, MD;  [facility];  Service: Endoscopy;  Laterality: N/A;  . GLAUCOMA SURGERY Bilateral 2000s   "laser"  . HOT HEMOSTASIS  08/09/2011   Procedure: HOT HEMOSTASIS (ARGON PLASMA COAGULATION/BICAP);  Surgeon: Jeryl Columbia, MD;  Location: [facility];  Service: Endoscopy;  Laterality: N/A;  . HOT HEMOSTASIS N/A 11/12/2013   Procedure: HOT HEMOSTASIS (ARGON PLASMA COAGULATION/BICAP);  Surgeon: Jeryl Columbia, MD;  Location: [facility];  Service: Endoscopy;  Laterality: N/A;  . INSERT / REPLACE / REMOVE PACEMAKER  03/24/2015  . JOINT REPLACEMENT    . KNEE SURGERY Left done with 2nd left  knee replacement   spacer bar placement  . LEFT HEART CATHETERIZATION WITH CORONARY ANGIOGRAM N/A 02/14/2012   Procedure: LEFT HEART CATHETERIZATION WITH CORONARY ANGIOGRAM;  Surgeon: Leonie Man, MD;  Location: [facility] CATH LAB;  Service: Cardiovascular;  Laterality: N/A;  . LEV DOPPLER  05/29/2012   RIGHT EXTREM. NORMAL VENOUS DUPLEX  . NM MYOCAR PERF WALL MOTION  01/26/2012; 11/2014   a. EF 79%,LV normal,ST SEGMENT CHANGE SUGGESTIVE OF ISCHEMIA.; LOW RISK, EF 60%. No ischemia or infarction. No wall motion abnormality   . PACEMAKER PLACEMENT    . REVISION TOTAL KNEE ARTHROPLASTY Left   . TONSILLECTOMY    . TOTAL KNEE ARTHROPLASTY Bilateral   . TRANSTHORACIC ECHOCARDIOGRAM  10/2016   EF 65-70%.  Vigorous LV function.  Mild LVH.  Poor acoustic windows.  Cannot assess diastolic function or significant valvular dysfunction.  Marland Kitchen TRIGGER FINGER RELEASE  11/22/2011   Procedure: RELEASE TRIGGER FINGER/A-1 PULLEY;  Surgeon: Meredith Pel, MD;  Location: [facility];  Service: Orthopedics;  Laterality: Left;  Left trigger thumb release  . TRIGGER FINGER RELEASE Right yrs ago     OB History   None      Home Medications    Prior to Admission medications   Medication Sig Start Date End Date Taking? Authorizing Provider   acetaminophen (TYLENOL) 500 MG tablet Take 500 mg by mouth every 6 (six) hours as needed for moderate pain.     [provider]  albuterol (PROVENTIL HFA;VENTOLIN HFA) 108 (90 BASE) MCG/ACT inhaler Inhale 2 puffs into the lungs every 6 (six) hours as needed for wheezing.    [provider]  aspirin-acetaminophen-caffeine (EXCEDRIN MIGRAINE) [phone] MG tablet Take 1 tablet by mouth every 6 (six) hours as needed for headache.    [provider]  Biotin 1 MG CAPS Take 1 mg by mouth daily as needed (supplement).    [provider]  busPIRone (BUSPAR) 10 MG tablet Take 1 tablet (10 mg total) by mouth every morning. 06/03/14   Garvin Fila, MD  Cholecalciferol (VITAMIN D-3 PO) Take 2 tablets by mouth every morning.     [provider]  clonazePAM (KLONOPIN) 0.5 MG tablet Take 0.5-1 mg by mouth 2 (two) times daily. Takes 2 tabs in am and 1 tab in pm    [provider]  clopidogrel (PLAVIX) 75 MG tablet Take 75 mg by mouth every morning.    [provider]  dicyclomine (BENTYL) 10 MG capsule Take 20 mg by mouth daily.     [provider]  esomeprazole (NEXIUM) 40 MG capsule Take 40 mg by mouth daily before breakfast.    [provider]  furosemide (LASIX) 20 MG tablet Take 1 tablet (20 mg total) by mouth daily. 06/12/17 09/10/17  Leonie Man, MD  glipiZIDE (GLUCOTROL) 10 MG tablet Take 20 mg by mouth 2 (two) times daily before a meal.  05/03/14   [provider]  insulin NPH (HUMULIN N,NOVOLIN N) 100 UNIT/ML injection Inject 0-20 Units into the skin See admin instructions. Novolin-N Give 20 unit in morning; give  5 units at 4pm; evening dose as needed.  If less than 150 blood sugar= 0units.  If greater than 200 blood sugar= give 10units to decrease by 100 blood sugar    [provider]  insulin regular (NOVOLIN R,HUMULIN R) 100 units/mL injection Inject 0-25 Units into the skin See admin instructions. Give  25 unit in morning; give 20units at 4pm; evening dose as needed.  If less than 150 blood sugar= 0units.  If greater than 200 blood sugar= give 10units to decrease by 100 blood sugar    [provider]  ipratropium (ATROVENT) 0.03 % nasal spray Place 2 sprays into both nostrils every 12 (twelve) hours. Patient taking differently: Place 2 sprays into both nostrils every 12 (twelve) hours as needed for rhinitis.  10/14/16   Patrecia Pour, MD  irbesartan (AVAPRO) 75 MG tablet Take 1 tablet (75 mg total) by mouth daily. 06/12/17   Leonie Man, MD  levothyroxine (SYNTHROID, LEVOTHROID) 100 MCG tablet Take 100 mcg by mouth every morning.     [provider]  lidocaine (LIDODERM) 5 % Place 3 patches onto the skin daily as needed (pain). Remove & Discard patch within 12 hours or as directed by MD    [provider]  LORazepam (ATIVAN) 0.5 MG tablet Take 0.5 mg by mouth daily as needed for anxiety.    [provider]  magnesium 30 MG tablet Take 30 mg by mouth daily.    [provider]  nortriptyline (PAMELOR) 50 MG capsule TAKE 2 CAPSULES (100MG ) AT BEDTIME 06/02/16   Garvin Fila, MD  Plecanatide (TRULANCE PO) Take 1 capsule by mouth daily as needed (constipation).     [provider]  polyethylene glycol (MIRALAX / GLYCOLAX) packet Take 17 g by mouth every other day.     [provider]  potassium chloride SA (K-DUR,KLOR-CON) 20 MEQ tablet Take 1 tablet (20 mEq total) by mouth 2 (two) times daily. 06/22/15   Davonna Belling, MD  rosuvastatin (CRESTOR) 20 MG tablet  03/13/17   [provider]  solifenacin (VESICARE) 10 MG tablet Take 10 mg by mouth daily as needed (overactive bladder).     [provider]  terconazole (TERAZOL 7) 0.4 % vaginal cream Place 1 applicator vaginally at bedtime as needed (for yeast infection).  04/18/14   [provider]  traMADol (ULTRAM) 50 MG tablet Take 1 tablet (50 mg total) by mouth  every 12 (twelve) hours as needed for moderate pain. 03/27/15   Leanor Kail, PA    Family History Family History  Problem Relation Age of Onset  . Cancer Mother   . Cancer - Prostate Father     Social History Social History   Tobacco Use  . Smoking status: Never Smoker  . Smokeless tobacco: Never Used  Substance Use Topics  . Alcohol use: No    Alcohol/week: 0.0 oz  . Drug use: No     Allergies   Iodine; Iohexol; Metoclopramide hcl; Sulfa antibiotics; Lactose intolerance (gi); and Lansoprazole   Review of Systems Review of Systems  Constitutional: Positive for fatigue. Negative for chills and fever.  HENT: Negative for congestion, rhinorrhea and sore throat.   Eyes: Negative for visual disturbance.  Respiratory: Positive for cough, chest tightness and shortness of breath. Negative for wheezing.   Cardiovascular: Positive for chest pain and leg swelling.  Gastrointestinal: Negative for abdominal pain, diarrhea, nausea and vomiting.  Genitourinary: Negative for dysuria, flank pain, vaginal bleeding and vaginal discharge.  Musculoskeletal: Negative for neck pain.  Skin: Negative for rash.  Allergic/Immunologic: Negative for immunocompromised state.  Neurological: Positive for weakness. Negative for syncope and headaches.  Hematological: Does not bruise/bleed easily.  All other systems reviewed and are negative.    Physical Exam Updated Vital Signs BP (!) 119/40   Pulse (!) 58   Temp 98.2 F (36.8 C) (Oral)   Resp 20  SpO2 100%   Physical Exam  Constitutional: She is oriented to person, place, and time. She appears well-developed and well-nourished. No distress.  HENT:  Head: Normocephalic and atraumatic.  Eyes: Conjunctivae are normal.  Neck: Neck supple.  Cardiovascular: Normal rate, regular rhythm and normal heart sounds. Exam reveals no friction rub.  No murmur heard. Pulmonary/Chest: Effort normal and breath sounds normal. Tachypnea noted. No  respiratory distress. She has no wheezes. She has no rales.  Abdominal: She exhibits no distension.  Musculoskeletal: She exhibits no edema.  Neurological: She is alert and oriented to person, place, and time. She exhibits normal muscle tone.  Skin: Skin is warm. Capillary refill takes less than 2 seconds.  Psychiatric: She has a normal mood and affect.  Nursing note and vitals reviewed.  LOWER EXTREMITY EXAM: RIGHT  INSPECTION & PALPATION: 1+ edema, though comparable to left. Moderate TTP over R femoral bundle, no palpable cords. No erythema or pallor.  SENSORY: sensation is intact to light touch in:  Superficial peroneal nerve distribution (over dorsum of foot) Deep peroneal nerve distribution (over first dorsal web space) Sural nerve distribution (over lateral aspect 5th metatarsal) Saphenous nerve distribution (over medial instep)  MOTOR:  + Motor EHL (great toe dorsiflexion) + FHL (great toe plantar flexion)  + TA (ankle dorsiflexion)  + GSC (ankle plantar flexion)  VASCULAR: 2+ dorsalis pedis and posterior tibialis pulses Capillary refill < 2 sec, toes warm and well-perfused  COMPARTMENTS: Soft, warm, well-perfused No pain with passive extension No parethesias     ED Treatments / Results  Labs (all labs ordered are listed, but only abnormal results are displayed) Labs Reviewed  BASIC METABOLIC PANEL - Abnormal; Notable for the following components:      Result Value   Glucose, Bld 368 (*)    BUN 27 (*)    Creatinine, Ser 1.79 (*)    GFR calc non Af Amer 26 (*)    GFR calc Af Amer 30 (*)    All other components within normal limits  CBC - Abnormal; Notable for the following components:   RBC 3.63 (*)    Hemoglobin 11.5 (*)    HCT 35.6 (*)    All other components within normal limits  HEPATIC FUNCTION PANEL - Abnormal; Notable for the following components:   ALT 11 (*)    All other components within normal limits  PROTIME-INR  BRAIN NATRIURETIC PEPTIDE    I-STAT TROPONIN, ED    EKG EKG Interpretation  Date/Time:  Monday Jun 12 2017 13:38:27 EDT Ventricular Rate:  67 PR Interval:    QRS Duration: 115 QT Interval:  433 QTC Calculation: 458 R Axis:   14 Text Interpretation:  Sinus rhythm Prolonged PR interval Nonspecific intraventricular conduction delay Low voltage, precordial leads Borderline repolarization abnormality Since last EKG, sinus rhythm has replaced paced rhythm TWI are more evident in inferolateral leads, though difficulty to compare given that last tracing was paced Confirmed by Duffy Bruce [phone]) on 06/12/2017 3:42:51 PM   Radiology Dg Chest 2 View  Result Date: 06/12/2017 CLINICAL DATA:  Onset of chest pain, shortness of breath, and left leg pain last night. History of 3 episodes of pulmonary embolism and frequent peripheral thromboses. History of COPD, CHF, hypertension, diabetes, and renal insufficiency. EXAM: CHEST - 2 VIEW COMPARISON:  Chest x-ray of October 23, 2016. FINDINGS: The lungs are adequately inflated. There is no focal infiltrate. There is no pleural effusion. No all edema is observed. The heart is mildly enlarged. The  pulmonary vascularity is prominent centrally. The ICD is in stable position. The bony thorax exhibits no acute abnormality. IMPRESSION: No pneumonia nor alveolar edema. Mild cardiomegaly and central pulmonary vascular prominence consistent with low-grade compensated CHF. Electronically Signed   By: David  Martinique M.D.   On: 06/12/2017 14:30    Procedures Procedures (including critical care time)  Medications Ordered in ED Medications - No data to display   Initial Impression / Assessment and Plan / ED Course  I have reviewed the triage vital signs and the nursing notes.  Pertinent labs & imaging results that were available during my care of the patient were reviewed by me and considered in my medical decision making (see chart for details).     77 yo F here with R femoral leg pain,  chest pain, and SOB. H/o recurrent DVT/PE. EKG non-ischemic here, though somewhat difficult to compare given that she is in NSR now and was paced on last EKG.  Clinically, concern for DVT with PE given her extensive h/o same. Will check vascular U/S, V/Q given her baseline CKD though this is slightly improved today. WBC normal, no PNA on CXR, doubt infectious process. Pt also with h/o CHF and has some edema, so this is consideration - BNP is pending. Trop neg, CP is less c/w ACS - will plan to delta trop. Pain is not consistent with dissection.  Otherwise, hip pain did began after fall - may be 2/2 MSK pain, but will need to eval DVT. Plain films ordered of hip as well. Pt updated and in agreement.  Patient care transferred to Dr. Johnney Killian at the end of my shift. Patient presentation, ED course, and plan of care discussed with review of all pertinent labs and imaging. Please see his/her note for further details regarding further ED course and disposition.   Final Clinical Impressions(s) / ED Diagnoses   Final diagnoses:  Right leg pain  Chest pain, unspecified type    ED Discharge Orders    None       Duffy Bruce, MD 06/12/17 1553

## 2017-06-12 NOTE — Assessment & Plan Note (Signed)
Status post pacemaker placement.  Is due for telephonic evaluation this month

## 2017-06-12 NOTE — ED Notes (Signed)
Vascular at bedside

## 2017-06-12 NOTE — Assessment & Plan Note (Signed)
Seems relatively euvolemic on exam.  Edema seems to be off and on. Plan: With her orthostatic dizziness, I am reducing ARB dose to 75 mg and will cut her baseline diuretic dose down to 20 mg.  And PRN additional doses. She is no longer on propranolol.

## 2017-06-12 NOTE — Assessment & Plan Note (Signed)
Would actually opt for mild permissive hypertension.  Reducing irbesartan down to 75 mg daily

## 2017-06-12 NOTE — Assessment & Plan Note (Signed)
Hard to tell what her episodes truly are, however unlikely to be arrhythmogenic since we are not seeing anything on her pacemaker interrogation. Recommendation at this time will be to allow for more blood pressure and by reducing ARB dose in half.  We will also cut her diuretic dose in half.

## 2017-06-12 NOTE — ED Triage Notes (Signed)
Pt reports "" I am having back and lungs problems, I haven't had a PE in 16 years." Pt reports that she has a HX of PE x 3. Pt reports back pain bilateral to flank and middle back pain 10/10. Pt denies pain and burning with urination, But took AZO  For urinary frequency. Pt reports that she has mid sternal chest pain 8/10 aches that worsens with breathing. X 2 days Pt also reports rt side groin pain 07/1096.  Pt has PM pt is on plavix

## 2017-06-12 NOTE — Patient Instructions (Signed)
MEDICATION INSTRUCTIONS   --DECREASE  MEDICATION AND TAKE FUROSEMIDE  20 MG  ONE TABLET DAILY  --DECREASE MEDICATION AND TAKE  IRBESARTAN 75 MG OME TABLET DAILY.   NEW PRESCRIPTION SENT TO MAIL ORDER.       Your physician recommends that you schedule a follow-up appointment in [place].    If you need a refill on your cardiac medications before your next appointment, please call your pharmacy.

## 2017-06-12 NOTE — Assessment & Plan Note (Signed)
Probably more related to venous stasis.  I recommended support stockings.  She would like to back off on her Lasix dose which is fine by me as long as she is okay using additional 20 mg dose as needed.

## 2017-06-12 NOTE — Progress Notes (Signed)
Bilateral lower extremity venous duplex completed. Preliminary results completed. techincally difficult due to body habitus. No obvious evidence of DVT, superficial thrombosis, or Baker's cyst. No change from exam of 06/12/15. [facility], [place] 06/12/2017 6:29 PM

## 2017-06-12 NOTE — ED Notes (Signed)
Patient being transported to [facility] at this time.

## 2017-06-12 NOTE — Progress Notes (Signed)
PCP: Shon Baton, MD  Clinic Note: Chief Complaint  Patient presents with  . Follow-up    Hypertensive heart disease  . Dizziness    HPI: Tara Jackson is a 77 y.o. female with a PMH below who presents today for 2-year follow-up.  She has a history of pacemaker placement for bradycardia.  Other than for bradycardia, she has been seen for various complaints of shortness of breath and intermittent chest pain with relatively normal cardiac evaluations.  She has had a history of abnormal nuclear stress test with normal coronaries. She has had a history of chronic PEs.   Tara Jackson was last seen on May 2017.  No issues with bradycardia since she has had a pacemaker placed.  Recent Hospitalizations: None since September 2018 for a fall.  (Drop attack) --> no abnormal findings on device interrogation.  Studies Personally Reviewed - (if available, images/films reviewed: From Epic Chart or Care Everywhere)  Echo September 2018: EF 65-70% mild LVH.  Poor acoustic windows.  Vigorous LV function.  Transcranial Dopplers November 2018 -findings suggest diffuse intracranial atherosclerosis.  Carotid Dopplers: No significant disease (<40% bilateral carotids) normal vertebrals and subclavian  Interval History: Tara Jackson may presents today for follow-up noting that she having intermittent episodes of near syncope where she feels like she is passing out and falls.  She says she has had for over the last several months, but her husband only notes one episode recently  that was about 2 weeks ago.  This usually happens when she is been standing for a while and starts to walk, not necessarily immediately with changing position.  She is currently sitting in a wheelchair here, but does indicate that she is up and about at home, but does not use a walker.  (Apparently she had a incident roughly 10 years ago where she was using a walker and fell forward despite having a walker in.  She therefore has a complex  with using a walker). She still has her mild pinprick type chest discomfort throughout but did not mention it to me.  She does have baseline dyspnea, clearly with exertion, but no change.  She is supposed to be using nighttime oxygen as opposed to her CPAP that she could not tolerate.  However she does not use it very often.  She also uses as needed daytime oxygen. She sleeps with 2 pillows and denies any real orthopnea.  Edema seems to be controlled, in fact she feels a little bit dehydrated. She denies any chest tightness or pressure with rest or exertion, only these atypical sharp type chest pains.  She has not had true syncope since her pacemaker was placed, but has had this near syncope type symptoms.  No TIA or amaurosis fugax.  She does note that on occasion her heart rate will go up, but not routinely. No TIA/amaurosis fugax symptoms. No melena, hematochezia, hematuria, or epstaxis. No claudication.  ROS: A comprehensive was performed. Review of Systems  Constitutional: Positive for malaise/fatigue. Negative for chills and fever.  HENT: Negative for congestion.   Respiratory: Positive for cough and shortness of breath. Negative for sputum production and wheezing.   Gastrointestinal: Negative for abdominal pain, blood in stool, constipation, heartburn, melena and nausea.       Poor appetite, but drinks plenty  Genitourinary: Positive for urgency. Negative for dysuria and hematuria.  Musculoskeletal: Positive for back pain (This is really the most limiting feature.) and joint pain.  Neurological: Positive for dizziness. Negative for  focal weakness.  Psychiatric/Behavioral: Negative for depression and memory loss (Does not stay asleep well.). The patient is nervous/anxious. The patient does not have insomnia.   All other systems reviewed and are negative.  I have reviewed and (if needed) personally updated the patient's problem list, medications, allergies, past medical and surgical  history, social and family history.   Past Medical History:  Diagnosis Date  . Anemia   . Anxiety   . Arthritis   . Asthma   . Brittle bone disease   . Cataract   . Complication of anesthesia    hard to wake up after anesthesia, trouble turning head  . Coronary artery disease, non-occlusive 2014   a. minimal CAD by cath in 2014 with 40% RI stenosis. b. low-risk NST in 11/2014.  Marland Kitchen Depression   . Diabetes mellitus type 2, insulin dependent ([place])   . Diabetic neuropathy ([place])   . Diastolic dysfunction, left ventricle 11/30/14   EF 55%, grade 1 DD  . DVT (deep venous thrombosis) ([place])    "18 in RLE; 7 LLE prior to PE" (03/24/2015)  . Family history of adverse reaction to anesthesia    " the whole family is hard to wake up"  . Glaucoma   . H/O hiatal hernia   . Hyperlipidemia   . Hypertension associated with diabetes ([place])   . Hypothyroidism   . Kidney stones    "passed them"  . Memory loss 06/03/2014  . On home oxygen therapy    "2L oxygen concentrator @ night"  . Osteoporosis   . Oxygen desaturation during sleep    wears 2 liters of oxygen at night   . Palpitations 35years ago   Cardionet monitor - revealed mostly normal sinus rhythm, sinus bradycardia with first-degree A-V block heart rates mostly in the 50s and 60s with some 70s. No arrhythmias, PVCs or PACs noted.  . PE (pulmonary thromboembolism) ([place]) x 3, last one was ~ 2003   history of recurrent RLE DVT with PE - last PE ~>13 yrs; Maintained on Plavix  . Pneumonia   . Presence of permanent cardiac pacemaker   . Restless legs syndrome   . Sleep apnea    cpap disontinued; test done 07/14/2008 ordered per  [place]rum  . Spinal headache   . Spinal stenosis    L 3, L 4 and L 5, and C 1, C 2 and C3  . Stroke [facility]) 04-19-2013   tia and stroke. Weakness Rt hand  . 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 s/p [employer] PPM in 03/2015 by Dr. Lovena Le secondary to sinus node dysfunction.   Marland Kitchen TIA (transient ischemic attack)  March 2014    Past Surgical History:  Procedure Laterality Date  . ABDOMINAL HYSTERECTOMY    . APPENDECTOMY    . BACK SURGERY     steroid inj  . CARDIAC CATHETERIZATION  02/14/2012   no evidence of obstructive coronary disease ,low EDP with normal EF  . CATARACT EXTRACTION W/ INTRAOCULAR LENS  IMPLANT, BILATERAL Bilateral 2000s  . CHOLECYSTECTOMY    . DOPPLER ECHOCARDIOGRAPHY  2014; October 2016   a. EF 94-85%; normal diastolic pressures, no regional wall motion motion abnormalities;; b/ F 55-60%. Normal wall motion. GR 1 DD. No valve lesions  . EP IMPLANTABLE DEVICE N/A 03/24/2015   [facility] Scientific PPM, Dr. Lovena Le  . ESOPHAGOGASTRODUODENOSCOPY  08/09/2011   Procedure: ESOPHAGOGASTRODUODENOSCOPY (EGD);  Surgeon: Jeryl Columbia, MD;  Location: [facility];  Service: Endoscopy;  Laterality: N/A;  .  ESOPHAGOGASTRODUODENOSCOPY (EGD) WITH PROPOFOL N/A 11/12/2013   Procedure: ESOPHAGOGASTRODUODENOSCOPY (EGD) WITH PROPOFOL;  Surgeon: Jeryl Columbia, MD;  Location: [facility] ENDOSCOPY;  Service: Endoscopy;  Laterality: N/A;  . ESOPHAGOGASTRODUODENOSCOPY (EGD) WITH PROPOFOL N/A 05/05/2016   Procedure: ESOPHAGOGASTRODUODENOSCOPY (EGD) WITH PROPOFOL;  Surgeon: Clarene Essex, MD;  Location: [facility] ENDOSCOPY;  Service: Endoscopy;  Laterality: N/A;  . GLAUCOMA SURGERY Bilateral 2000s   "laser"  . HOT HEMOSTASIS  08/09/2011   Procedure: HOT HEMOSTASIS (ARGON PLASMA COAGULATION/BICAP);  Surgeon: Jeryl Columbia, MD;  Location: [facility];  Service: Endoscopy;  Laterality: N/A;  . HOT HEMOSTASIS N/A 11/12/2013   Procedure: HOT HEMOSTASIS (ARGON PLASMA COAGULATION/BICAP);  Surgeon: Jeryl Columbia, MD;  Location: [facility];  Service: Endoscopy;  Laterality: N/A;  . INSERT / REPLACE / REMOVE PACEMAKER  03/24/2015  . JOINT REPLACEMENT    . KNEE SURGERY Left done with 2nd left knee replacement   spacer bar placement  . LEFT HEART CATHETERIZATION WITH CORONARY ANGIOGRAM N/A 02/14/2012   Procedure: LEFT HEART CATHETERIZATION WITH  CORONARY ANGIOGRAM;  Surgeon: Leonie Man, MD;  Location[facility] C[facility] LAB;  Service: Cardiovascular;  Laterality: N/A;  . LEV DOPPLER  05/29/2012   RIGHT EXTREM. NORMAL VENOUS DUPLEX  . NM MYOCAR PERF WALL MOTION  01/26/2012; 11/2014   a. EF 79%,LV normal,ST SEGMENT CHANGE SUGGESTIVE OF ISCHEMIA.; LOW RISK, EF 60%. No ischemia or infarction. No wall motion abnormality   . PACEMAKER PLACEMENT    . REVISION TOTAL KNEE ARTHROPLASTY Left   . TONSILLECTOMY    . TOTAL KNEE ARTHROPLASTY Bilateral   . TRANSTHORACIC ECHOCARDIOGRAM  10/2016   EF 65-70%.  Vigorous LV function.  Mild LVH.  Poor acoustic windows.  Cannot assess diastolic function or significant valvular dysfunction.  Marland Kitchen TRIGGER FINGER RELEASE  11/22/2011   Procedure: RELEASE TRIGGER FINGER/A-1 PULLEY;  Surgeon: Meredith Pel, MD;  Location: [place];  Service: Orthopedics;  Laterality: Left;  Left trigger thumb release  . TRIGGER FINGER RELEASE Right yrs ago    Current Meds  Medication Sig  . acetaminophen (TYLENOL) 500 MG tablet Take 500 mg by mouth every 6 (six) hours as needed for moderate pain.   Marland Kitchen albuterol (PROVENTIL HFA;VENTOLIN HFA) 108 (90 BASE) MCG/ACT inhaler Inhale 2 puffs into the lungs every 6 (six) hours as needed for wheezing.  . Biotin 1 MG CAPS Take 1 mg by mouth daily as needed (supplement).  . busPIRone (BUSPAR) 10 MG tablet Take 1 tablet (10 mg total) by mouth every morning.  . Cholecalciferol (VITAMIN D-3 PO) Take 2 tablets by mouth every morning.   . clonazePAM (KLONOPIN) 0.5 MG tablet Take 0.5-1 mg by mouth 2 (two) times daily. Takes 2 tabs in am and 1 tab in pm  . clopidogrel (PLAVIX) 75 MG tablet Take 75 mg by mouth every morning.  . dicyclomine (BENTYL) 10 MG capsule Take 20 mg by mouth daily.   Marland Kitchen esomeprazole (NEXIUM) 40 MG capsule Take 40 mg by mouth daily before breakfast.  . glipiZIDE (GLUCOTROL) 10 MG tablet Take 20 mg by mouth 2 (two) times daily before a meal.   . insulin NPH (HUMULIN N,NOVOLIN N)  100 UNIT/ML injection Inject 0-20 Units into the skin See admin instructions. Novolin-N Give 20 unit in morning; give  5 units at 4pm; evening dose as needed.  If less than 150 blood sugar= 0units.  If greater than 200 blood sugar= give 10units to decrease by 100 blood sugar  . insulin regular (NOVOLIN R,HUMULIN R) 100  units/mL injection Inject 0-25 Units into the skin See admin instructions. Give 25 unit in morning; give 20units at 4pm; evening dose as needed.  If less than 150 blood sugar= 0units.  If greater than 200 blood sugar= give 10units to decrease by 100 blood sugar  . ipratropium (ATROVENT) 0.03 % nasal spray Place 2 sprays into both nostrils every 12 (twelve) hours. (Patient taking differently: Place 2 sprays into both nostrils every 12 (twelve) hours as needed for rhinitis. )  . levothyroxine (SYNTHROID, LEVOTHROID) 100 MCG tablet Take 100 mcg by mouth every morning.   . lidocaine (LIDODERM) 5 % Place 3 patches onto the skin daily as needed (pain). Remove & Discard patch within 12 hours or as directed by MD  . LORazepam (ATIVAN) 0.5 MG tablet Take 0.5 mg by mouth daily as needed for anxiety.  . magnesium 30 MG tablet Take 30 mg by mouth daily.  . nortriptyline (PAMELOR) 50 MG capsule TAKE 2 CAPSULES (100MG ) AT BEDTIME  . polyethylene glycol (MIRALAX / GLYCOLAX) packet Take 17 g by mouth every other day.   . potassium chloride SA (K-DUR,KLOR-CON) 20 MEQ tablet Take 1 tablet (20 mEq total) by mouth 2 (two) times daily.  . rosuvastatin (CRESTOR) 20 MG tablet   . solifenacin (VESICARE) 10 MG tablet Take 10 mg by mouth daily as needed (overactive bladder).   . traMADol (ULTRAM) 50 MG tablet Take 1 tablet (50 mg total) by mouth every 12 (twelve) hours as needed for moderate pain.  . [DISCONTINUED] furosemide (LASIX) 40 MG tablet Take 40 mg by mouth.  . [DISCONTINUED] irbesartan (AVAPRO) 150 MG tablet Take 150 mg by mouth daily.    Allergies  Allergen Reactions  . Iodine Other (See  Comments)    ONLY IV--Makes unconscious  . Iohexol Other (See Comments)     Code: SOB, Desc: cardiac arrest w/ iv contrast, has never used 13 hr prep//alice calhoun, Onset Date: 16109604   . Metoclopramide Hcl Other (See Comments)    suicidal  . Sulfa Antibiotics Hives  . Lactose Intolerance (Gi) Diarrhea  . Lansoprazole Nausea And Vomiting    Social History   Tobacco Use  . Smoking status: Never Smoker  . Smokeless tobacco: Never Used  Substance Use Topics  . Alcohol use: No    Alcohol/week: 0.0 oz  . Drug use: No   Social History   Social History Narrative   Married [name].) married 46 years.   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     family history includes Cancer in her mother; Cancer - Prostate in her father.  Wt Readings from Last 3 Encounters:  06/12/17 266 lb (120.7 kg)  03/07/17 280 lb (127 kg)  01/06/17 282 lb (127.9 kg)    PHYSICAL EXAM BP 134/64   Pulse 70   Ht 5\' 7"  (1.702 m)   Wt 266 lb (120.7 kg)   BMI 41.66 kg/m  Physical Exam  Constitutional: She is oriented to person, place, and time. She appears well-developed and well-nourished. No distress.  Morbidly obese.  Well-groomed.  HENT:  Head: Normocephalic and atraumatic.  Neck: Normal range of motion. Neck supple. No hepatojugular reflux and no JVD present. Carotid bruit is not present. No tracheal deviation present. No thyromegaly present.  Cardiovascular: Normal rate, regular rhythm, S1 normal, S2 normal and intact distal pulses. PMI is not displaced (Unable to palpate). Exam reveals distant heart sounds. Exam reveals no gallop and no friction rub.  No murmur heard. Pulmonary/Chest: Effort normal and breath sounds normal. No respiratory distress. She has no wheezes. She has no rales.  Abdominal: Soft. Bowel sounds are normal. She exhibits no distension. There is no tenderness. There is no rebound.  Musculoskeletal: Normal range of motion. She exhibits  edema (Trivial).  Neurological: She is alert and oriented to person, place, and time. No cranial nerve deficit.  Psychiatric: She has a normal mood and affect. Her behavior is normal. Judgment and thought content normal.  Vitals reviewed.    Adult ECG Report  Rate: 70 ;  Rhythm: normal sinus rhythm and 1 degree AVB.  Poor R wave progression (cannot exclude anterior MI, age undetermined).  Otherwise normal axis, intervals and durations.;   Narrative Interpretation: Stable EKG   Other studies Reviewed:  Additional studies/ records that were reviewed today include:  Recent Labs: Labs from March 2019: A1c 7.7.  Total cholesterol 131 and triglycerides 79, HDL 68 (last LDL from March 30, 1859).  BUN/creatinine 24/1.77.  TSH 4.14.   ASSESSMENT / PLAN: Problem List Items Addressed This Visit    Sinus node dysfunction (HCC) (Chronic)    Status post pacemaker placement.  Is due for telephonic evaluation this month      Relevant Medications   rosuvastatin (CRESTOR) 20 MG tablet   furosemide (LASIX) 20 MG tablet   irbesartan (AVAPRO) 75 MG tablet   Orthostatic dizziness - Primary    Hard to tell what her episodes truly are, however unlikely to be arrhythmogenic since we are not seeing anything on her pacemaker interrogation. Recommendation at this time will be to allow for more blood pressure and by reducing ARB dose in half.  We will also cut her diuretic dose in half.      Essential hypertension (Chronic)    Would actually opt for mild permissive hypertension.  Reducing irbesartan down to 75 mg daily      Relevant Medications   rosuvastatin (CRESTOR) 20 MG tablet   furosemide (LASIX) 20 MG tablet   irbesartan (AVAPRO) 75 MG tablet   Chronic diastolic heart failure (HCC) (Chronic)    Seems relatively euvolemic on exam.  Edema seems to be off and on. Plan: With her orthostatic dizziness, I am reducing ARB dose to 75 mg and will cut her baseline diuretic dose down to 20 mg.  And PRN  additional doses. She is no longer on propranolol.      Relevant Medications   rosuvastatin (CRESTOR) 20 MG tablet   furosemide (LASIX) 20 MG tablet   irbesartan (AVAPRO) 75 MG tablet   Other Relevant Orders   EKG 12-Lead   Ankle edema (Chronic)    Probably more related to venous stasis.  I recommended support stockings.  She would like to back off on her Lasix dose which is fine by me as long as she is okay using additional 20 mg dose as needed.          I spent a total of 35 minutes with the patient and chart review. >  50% of the time was spent in direct patient consultation.   Current medicines are reviewed at length with the patient today.  (+/- concerns) n/a The following changes have been made:  Reduce irbesartan to 75 mg and Lasix standing dose of 20 mg with additional 20 as needed.  Patient Instructions  MEDICATION INSTRUCTIONS   --DECREASE  MEDICATION AND TAKE FUROSEMIDE  20 MG  ONE TABLET DAILY  --DECREASE MEDICATION AND TAKE  IRBESARTAN 75  MG OME TABLET DAILY.   NEW PRESCRIPTION SENT TO MAIL ORDER.       Your physician recommends that you schedule a follow-up appointment in [place].    If you need a refill on your cardiac medications before your next appointment, please call your pharmacy.      Studies Ordered:   Orders Placed This Encounter  Procedures  . EKG 12-Lead      Glenetta Hew, M.D., M.S. Interventional Cardiologist   Pager # [phone] Phone # [phone] [address] [place], [place] [address]   Thank you for choosing [facility] at [facility]!!

## 2017-06-13 DIAGNOSIS — I509 Heart failure, unspecified: Secondary | ICD-10-CM | POA: Diagnosis not present

## 2017-06-13 DIAGNOSIS — I129 Hypertensive chronic kidney disease with stage 1 through stage 4 chronic kidney disease, or unspecified chronic kidney disease: Secondary | ICD-10-CM | POA: Diagnosis not present

## 2017-06-13 DIAGNOSIS — M79604 Pain in right leg: Secondary | ICD-10-CM | POA: Diagnosis not present

## 2017-06-13 DIAGNOSIS — I951 Orthostatic hypotension: Secondary | ICD-10-CM | POA: Diagnosis not present

## 2017-06-13 DIAGNOSIS — Z794 Long term (current) use of insulin: Secondary | ICD-10-CM | POA: Diagnosis not present

## 2017-06-13 DIAGNOSIS — E1140 Type 2 diabetes mellitus with diabetic neuropathy, unspecified: Secondary | ICD-10-CM | POA: Diagnosis not present

## 2017-06-13 DIAGNOSIS — M545 Low back pain: Secondary | ICD-10-CM | POA: Diagnosis not present

## 2017-06-13 DIAGNOSIS — N183 Chronic kidney disease, stage 3 (moderate): Secondary | ICD-10-CM | POA: Diagnosis not present

## 2017-06-13 DIAGNOSIS — Z950 Presence of cardiac pacemaker: Secondary | ICD-10-CM | POA: Diagnosis not present

## 2017-06-15 ENCOUNTER — Ambulatory Visit (INDEPENDENT_AMBULATORY_CARE_PROVIDER_SITE_OTHER): Payer: Medicare HMO | Admitting: *Deleted

## 2017-06-15 DIAGNOSIS — I495 Sick sinus syndrome: Secondary | ICD-10-CM | POA: Diagnosis not present

## 2017-06-15 NOTE — Progress Notes (Signed)
Remote pacemaker transmission.   

## 2017-06-20 ENCOUNTER — Encounter: Payer: Self-pay | Admitting: Cardiology

## 2017-06-21 DIAGNOSIS — I10 Essential (primary) hypertension: Secondary | ICD-10-CM | POA: Diagnosis not present

## 2017-06-21 DIAGNOSIS — I509 Heart failure, unspecified: Secondary | ICD-10-CM | POA: Diagnosis not present

## 2017-07-04 ENCOUNTER — Telehealth (INDEPENDENT_AMBULATORY_CARE_PROVIDER_SITE_OTHER): Payer: Self-pay | Admitting: Orthopedic Surgery

## 2017-07-04 NOTE — Telephone Encounter (Signed)
Patient called asked if there is a cancellation can she be worked into Dr [employer] schedule. Patient said her back is hurting her pretty bad. Patient has an appointment on 07/10/17.  The number to contact patient is [phone]

## 2017-07-04 NOTE — Telephone Encounter (Signed)
Coming at 1045 tomorrow

## 2017-07-05 ENCOUNTER — Ambulatory Visit (INDEPENDENT_AMBULATORY_CARE_PROVIDER_SITE_OTHER): Payer: Medicare HMO

## 2017-07-05 ENCOUNTER — Encounter (INDEPENDENT_AMBULATORY_CARE_PROVIDER_SITE_OTHER): Payer: Self-pay | Admitting: Orthopedic Surgery

## 2017-07-05 ENCOUNTER — Ambulatory Visit (INDEPENDENT_AMBULATORY_CARE_PROVIDER_SITE_OTHER): Payer: Medicare HMO | Admitting: Orthopedic Surgery

## 2017-07-05 DIAGNOSIS — M545 Low back pain: Secondary | ICD-10-CM | POA: Diagnosis not present

## 2017-07-05 DIAGNOSIS — G8929 Other chronic pain: Secondary | ICD-10-CM

## 2017-07-09 ENCOUNTER — Encounter (INDEPENDENT_AMBULATORY_CARE_PROVIDER_SITE_OTHER): Payer: Self-pay | Admitting: Orthopedic Surgery

## 2017-07-09 NOTE — Progress Notes (Signed)
Office Visit Note   Patient: Tara Jackson           Date of Birth: Mar 14, 1940           MRN: [id number] Visit Date: 07/05/2017 Requested by: Shon Baton, MD [address] [place], [place] [address] PCP: Shon Baton, MD  Subjective: Chief Complaint  Patient presents with  . Lower Back - Pain    HPI: Tara Jackson is a patient with low back pain.  Denies any history of injury.  Reports that the pain is been getting worse over the last week.  She states "I have spinal stenosis".  She had MRI scan in 2012 which showed some degenerative changes but no severe spinal stenosis.  She is had no prior surgery in her back.  She states that her bone density is better.  She walks in her house but she has to hold onto things when she is been walking.  She does have a history of epidural steroid injections into her back.  Since she was last seen she has had a pacemaker placed and cannot have MRI scanning.  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              ROS: All systems reviewed are negative as they relate to the chief complaint within the history of present illness.  Patient denies  fevers or chills.   Assessment & Plan: Visit Diagnoses:  1. Chronic low back pain, unspecified back pain laterality, with sciatica presence unspecified     Plan: Impression is low back pain with a little bit of hip flexion weakness on the right compared to the left.  Most of her pain is staying in the back region and not radiating.  I think she may do well to get some facet blocks done.  This would not require her to stop the Plavix.  We will get her in as soon as possible with either Dr. Ernestina Patches or [facility] imaging.  I will see her back as needed.  Follow-Up Instructions: Return if symptoms worsen or fail to improve.   Orders:  Orders Placed This Encounter  Procedures  . XR Lumbar Spine 2-3 Views  . DG FACET JT INJ L /S SINGLE LEVEL LEFT W/FL/CT  .  DG FACET JT INJ L /S SINGLE LEVEL RIGHT W/FL/CT   No orders of the defined types were placed in this encounter.     Procedures: No procedures performed   Clinical Data: No additional findings.  Objective: Vital Signs: There were no vitals taken for this visit.  Physical Exam:   Constitutional: Patient appears well-developed HEENT:  Head: Normocephalic Eyes:EOM are normal Neck: Normal range of motion Cardiovascular: Normal rate Pulmonary/chest: Effort normal Neurologic: Patient is alert Skin: Skin is warm Psychiatric: Patient has normal mood and affect    Ortho Exam: Ortho exam demonstrates that the patient is in a wheelchair.  She has no nerve root tension signs.  Some edema in the bilateral lower extremities.  Pedal pulses palpable.  No groin pain with internal/external rotation of the leg.  No other masses lymphadenopathy or skin changes noted in the back region.  Specialty Comments:  No specialty comments available.  Imaging: No results found.   PMFS History: Patient Active Problem List   Diagnosis Date Noted  . Orthostatic dizziness 06/12/2017  . Diabetic neuropathy ([place]) 12/13/2016  . 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 Acute renal  failure superimposed on chronic kidney disease ([place]) 10/24/2016  . Syncope 10/23/2016  . Chronic anemia 10/11/2016  . Pacemaker 04/26/2015  . Chronic daily headache 04/07/2015  . Symptomatic bradycardia 03/24/2015  . Sinus node dysfunction ([place]) 03/24/2015  . Chronic diastolic heart failure ([place])   . Diabetes mellitus type 2 in obese ([place])   . Normal coronary arteries 01/14/2015  . Normal cardiac stress test 11/30/2014  . Bradycardia 11/28/2014  . 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 Morbid obesity ([place]) 11/28/2014  . CKD stage 3 due to type 2 diabetes mellitus ([place]) 11/28/2014  . Memory loss 06/03/2014  . AKI (acute kidney injury) ([place])   . Chest pain 04/19/2014  . Pleuritic chest  pain 04/19/2014  . Diabetes mellitus ([place]) 04/19/2014  . Edema of both legs 04/19/2014  . Dyspnea   . SOB (shortness of breath)   . Tremor of right hand 09/20/2013  . Headache(784.0) 09/19/2013  . Restless legs syndrome (RLS) 09/19/2013  . Constipation due to pain medication 08/21/2013  . Ankle edema 08/21/2013  . PVC's (premature ventricular contractions) 02/10/2013  . Diastolic dysfunction, left ventricle   . 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 PULMONARY EMBOLISM, HX OF 10/02/2009  . ALLERGIC RHINITIS 04/07/2009  . Sleep apnea 07/09/2008  . Hyperlipidemia with target LDL less than 130 05/20/2008  . Essential hypertension 05/20/2008   Past Medical History:  Diagnosis Date  . Anemia   . Anxiety   . Arthritis   . Asthma   . Brittle bone disease   . Cataract   . CKD (chronic kidney disease)   . Complication of anesthesia    hard to wake up after anesthesia, trouble turning head  . Coronary artery disease, non-occlusive 2014   a. minimal CAD by cath in 2014 with 40% RI stenosis. b. low-risk NST in 11/2014.  Marland Kitchen Depression   . Diabetes mellitus type 2, insulin dependent ([place])   . Diabetic neuropathy ([place])   . Diastolic dysfunction, left ventricle 11/30/14   EF 55%, grade 1 DD  . DVT (deep venous thrombosis) ([place])    "18 in RLE; 7 LLE prior to PE" (03/24/2015)  . Family history of adverse reaction to anesthesia    " the whole family is hard to wake up"  . Glaucoma   . H/O hiatal hernia   . Hyperlipidemia   . Hypertension associated with diabetes ([place])   . Hypothyroidism   . Kidney stones    "passed them"  . Memory loss 06/03/2014  . On home oxygen therapy    "2L oxygen concentrator @ night"  . Osteoporosis   . Oxygen desaturation during sleep    wears 2 liters of oxygen at night   . Palpitations 35years ago   Cardionet monitor - revealed mostly normal sinus rhythm, sinus bradycardia with first-degree A-V block heart rates mostly in the 50s and 60s with some 70s. No  arrhythmias, PVCs or PACs noted.  . PE (pulmonary thromboembolism) ([place]) x 3, last one was ~ 2003   history of recurrent RLE DVT with PE - last PE ~>13 yrs; Maintained on Plavix  . Pneumonia   . Presence of permanent cardiac pacemaker   . Restless legs syndrome   . Sleep apnea    cpap disontinued; test done 07/14/2008 ordered per  [place]  . Spinal headache   . Spinal stenosis    L 3, L 4 and L 5, and C 1, C 2 and C3  . Stroke [facility])  04-19-2013   tia and stroke. Weakness Rt hand  . 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 s/p [employer] PPM in 03/2015 by Dr. Lovena Le secondary to sinus node dysfunction.   Marland Kitchen TIA (transient ischemic attack) March 2014    Family History  Problem Relation Age of Onset  . Cancer Mother   . Cancer - Prostate Father     Past Surgical History:  Procedure Laterality Date  . ABDOMINAL HYSTERECTOMY    . APPENDECTOMY    . BACK SURGERY     steroid inj  . CARDIAC CATHETERIZATION  02/14/2012   no evidence of obstructive coronary disease ,low EDP with normal EF  . CATARACT EXTRACTION W/ INTRAOCULAR LENS  IMPLANT, BILATERAL Bilateral 2000s  . CHOLECYSTECTOMY    . DOPPLER ECHOCARDIOGRAPHY  2014; October 2016   a. EF 16-96%; normal diastolic pressures, no regional wall motion motion abnormalities;; b/ F 55-60%. Normal wall motion. GR 1 DD. No valve lesions  . EP IMPLANTABLE DEVICE N/A 03/24/2015   [facility] [employer] PPM, Dr. Lovena Le  . ESOPHAGOGASTRODUODENOSCOPY  08/09/2011   Procedure: ESOPHAGOGASTRODUODENOSCOPY (EGD);  Surgeon: Jeryl Columbia, MD;  Location: [facility];  Service: Endoscopy;  Laterality: N/A;  . ESOPHAGOGASTRODUODENOSCOPY (EGD) WITH PROPOFOL N/A 11/12/2013   Procedure: ESOPHAGOGASTRODUODENOSCOPY (EGD) WITH PROPOFOL;  Surgeon: Jeryl Columbia, MD;  Location: [facility] ENDOSCOPY;  Service: Endoscopy;  Laterality: N/A;  . ESOPHAGOGASTRODUODENOSCOPY (EGD) WITH PROPOFOL N/A 05/05/2016   Procedure: ESOPHAGOGASTRODUODENOSCOPY (EGD) WITH PROPOFOL;  Surgeon: Clarene Essex, MD;  Location: [facility] ENDOSCOPY;  Service: Endoscopy;  Laterality: N/A;  . GLAUCOMA SURGERY Bilateral 2000s   "laser"  . HOT HEMOSTASIS  08/09/2011   Procedure: HOT HEMOSTASIS (ARGON PLASMA COAGULATION/BICAP);  Surgeon: Jeryl Columbia, MD;  Location: [facility];  Service: Endoscopy;  Laterality: N/A;  . HOT HEMOSTASIS N/A 11/12/2013   Procedure: HOT HEMOSTASIS (ARGON PLASMA COAGULATION/BICAP);  Surgeon: Jeryl Columbia, MD;  Location: [facility];  Service: Endoscopy;  Laterality: N/A;  . INSERT / REPLACE / REMOVE PACEMAKER  03/24/2015  . JOINT REPLACEMENT    . KNEE SURGERY Left done with 2nd left knee replacement   spacer bar placement  . LEFT HEART CATHETERIZATION WITH CORONARY ANGIOGRAM N/A 02/14/2012   Procedure: LEFT HEART CATHETERIZATION WITH CORONARY ANGIOGRAM;  Surgeon: Leonie Man, MD;  [facility] C[facility] LAB;  Service: Cardiovascular;  Laterality: N/A;  . LEV DOPPLER  05/29/2012   RIGHT EXTREM. NORMAL VENOUS DUPLEX  . NM MYOCAR PERF WALL MOTION  01/26/2012; 11/2014   a. EF 79%,LV normal,ST SEGMENT CHANGE SUGGESTIVE OF ISCHEMIA.; LOW RISK, EF 60%. No ischemia or infarction. No wall motion abnormality   . PACEMAKER PLACEMENT    . REVISION TOTAL KNEE ARTHROPLASTY Left   . TONSILLECTOMY    . TOTAL KNEE ARTHROPLASTY Bilateral   . TRANSTHORACIC ECHOCARDIOGRAM  10/2016   EF 65-70%.  Vigorous LV function.  Mild LVH.  Poor acoustic windows.  Cannot assess diastolic function or significant valvular dysfunction.  Marland Kitchen TRIGGER FINGER RELEASE  11/22/2011   Procedure: RELEASE TRIGGER FINGER/A-1 PULLEY;  Surgeon: Meredith Pel, MD;  Location: [facility];  Service: Orthopedics;  Laterality: Left;  Left trigger thumb release  . TRIGGER FINGER RELEASE Right yrs ago   Social History   Occupational History  . Occupation: retired  Tobacco Use  . Smoking status: Never Smoker  . Smokeless tobacco: Never Used  Substance and Sexual Activity  . Alcohol use: No    Alcohol/week: 0.0 oz  . Drug use: No    .  Sexual activity: Yes

## 2017-07-10 ENCOUNTER — Ambulatory Visit (INDEPENDENT_AMBULATORY_CARE_PROVIDER_SITE_OTHER): Payer: Medicare HMO | Admitting: Orthopedic Surgery

## 2017-07-11 DIAGNOSIS — E039 Hypothyroidism, unspecified: Secondary | ICD-10-CM | POA: Diagnosis not present

## 2017-07-11 DIAGNOSIS — I2510 Atherosclerotic heart disease of native coronary artery without angina pectoris: Secondary | ICD-10-CM | POA: Diagnosis not present

## 2017-07-11 DIAGNOSIS — Z950 Presence of cardiac pacemaker: Secondary | ICD-10-CM | POA: Diagnosis not present

## 2017-07-11 DIAGNOSIS — N184 Chronic kidney disease, stage 4 (severe): Secondary | ICD-10-CM | POA: Diagnosis not present

## 2017-07-11 DIAGNOSIS — E785 Hyperlipidemia, unspecified: Secondary | ICD-10-CM | POA: Diagnosis not present

## 2017-07-11 DIAGNOSIS — E1122 Type 2 diabetes mellitus with diabetic chronic kidney disease: Secondary | ICD-10-CM | POA: Diagnosis not present

## 2017-07-11 DIAGNOSIS — D631 Anemia in chronic kidney disease: Secondary | ICD-10-CM | POA: Diagnosis not present

## 2017-07-11 DIAGNOSIS — G459 Transient cerebral ischemic attack, unspecified: Secondary | ICD-10-CM | POA: Diagnosis not present

## 2017-07-11 DIAGNOSIS — I509 Heart failure, unspecified: Secondary | ICD-10-CM | POA: Diagnosis not present

## 2017-07-11 DIAGNOSIS — I129 Hypertensive chronic kidney disease with stage 1 through stage 4 chronic kidney disease, or unspecified chronic kidney disease: Secondary | ICD-10-CM | POA: Diagnosis not present

## 2017-07-12 ENCOUNTER — Telehealth: Payer: Self-pay | Admitting: Cardiology

## 2017-07-12 NOTE — Telephone Encounter (Signed)
Received records from [place] on 07/12/17, Appt 10/19/17 @ 11:20AM. [place]

## 2017-07-13 LAB — CUP PACEART REMOTE DEVICE CHECK
Battery Remaining Longevity: 144 mo
Battery Remaining Percentage: 100 %
Brady Statistic RA Percent Paced: 67 %
Brady Statistic RV Percent Paced: 36 %
Date Time Interrogation Session: 20190508070100
Implantable Lead Implant Date: 20170214
Implantable Lead Implant Date: 20170214
Implantable Lead Location: 753859
Implantable Lead Location: 753860
Implantable Lead Model: 7740
Implantable Lead Model: 7741
Implantable Lead Serial Number: 649859
Implantable Lead Serial Number: 728741
Implantable Pulse Generator Implant Date: 20170214
Lead Channel Impedance Value: 718 Ohm
Lead Channel Impedance Value: 745 Ohm
Lead Channel Pacing Threshold Amplitude: 0.5 V
Lead Channel Pacing Threshold Amplitude: 1.2 V
Lead Channel Pacing Threshold Pulse Width: 0.4 ms
Lead Channel Pacing Threshold Pulse Width: 0.4 ms
Lead Channel Setting Pacing Amplitude: 1.7 V
Lead Channel Setting Pacing Amplitude: 2 V
Lead Channel Setting Pacing Pulse Width: 0.4 ms
Lead Channel Setting Sensing Sensitivity: 2.5 mV
Pulse Gen Serial Number: 718098

## 2017-07-17 DIAGNOSIS — E1142 Type 2 diabetes mellitus with diabetic polyneuropathy: Secondary | ICD-10-CM | POA: Diagnosis not present

## 2017-07-17 DIAGNOSIS — M47816 Spondylosis without myelopathy or radiculopathy, lumbar region: Secondary | ICD-10-CM | POA: Diagnosis not present

## 2017-07-17 DIAGNOSIS — G894 Chronic pain syndrome: Secondary | ICD-10-CM | POA: Diagnosis not present

## 2017-07-17 DIAGNOSIS — Z79891 Long term (current) use of opiate analgesic: Secondary | ICD-10-CM | POA: Diagnosis not present

## 2017-07-17 DIAGNOSIS — M150 Primary generalized (osteo)arthritis: Secondary | ICD-10-CM | POA: Diagnosis not present

## 2017-07-22 DIAGNOSIS — I10 Essential (primary) hypertension: Secondary | ICD-10-CM | POA: Diagnosis not present

## 2017-07-22 DIAGNOSIS — I509 Heart failure, unspecified: Secondary | ICD-10-CM | POA: Diagnosis not present

## 2017-08-04 ENCOUNTER — Emergency Department (HOSPITAL_COMMUNITY)
Admission: EM | Admit: 2017-08-04 | Discharge: 2017-08-04 | Disposition: A | Discharge status: Home / Self Care | Payer: Medicare HMO | Attending: Emergency Medicine | Admitting: Emergency Medicine

## 2017-08-04 ENCOUNTER — Emergency Department (HOSPITAL_COMMUNITY): Payer: Medicare HMO

## 2017-08-04 ENCOUNTER — Encounter (HOSPITAL_COMMUNITY): Payer: Self-pay | Admitting: Nurse Practitioner

## 2017-08-04 DIAGNOSIS — N183 Chronic kidney disease, stage 3 (moderate): Secondary | ICD-10-CM | POA: Insufficient documentation

## 2017-08-04 DIAGNOSIS — W19XXXA Unspecified fall, initial encounter: Secondary | ICD-10-CM | POA: Diagnosis not present

## 2017-08-04 DIAGNOSIS — Y939 Activity, unspecified: Secondary | ICD-10-CM | POA: Diagnosis not present

## 2017-08-04 DIAGNOSIS — I2510 Atherosclerotic heart disease of native coronary artery without angina pectoris: Secondary | ICD-10-CM | POA: Diagnosis not present

## 2017-08-04 DIAGNOSIS — E039 Hypothyroidism, unspecified: Secondary | ICD-10-CM | POA: Diagnosis not present

## 2017-08-04 DIAGNOSIS — Y92129 Unspecified place in nursing home as the place of occurrence of the external cause: Secondary | ICD-10-CM | POA: Insufficient documentation

## 2017-08-04 DIAGNOSIS — Z794 Long term (current) use of insulin: Secondary | ICD-10-CM | POA: Diagnosis not present

## 2017-08-04 DIAGNOSIS — I129 Hypertensive chronic kidney disease with stage 1 through stage 4 chronic kidney disease, or unspecified chronic kidney disease: Secondary | ICD-10-CM | POA: Diagnosis not present

## 2017-08-04 DIAGNOSIS — E1122 Type 2 diabetes mellitus with diabetic chronic kidney disease: Secondary | ICD-10-CM | POA: Insufficient documentation

## 2017-08-04 DIAGNOSIS — Z8673 Personal history of transient ischemic attack (TIA), and cerebral infarction without residual deficits: Secondary | ICD-10-CM | POA: Insufficient documentation

## 2017-08-04 DIAGNOSIS — R55 Syncope and collapse: Secondary | ICD-10-CM

## 2017-08-04 DIAGNOSIS — S20212A Contusion of left front wall of thorax, initial encounter: Secondary | ICD-10-CM | POA: Diagnosis not present

## 2017-08-04 DIAGNOSIS — S299XXA Unspecified injury of thorax, initial encounter: Secondary | ICD-10-CM | POA: Diagnosis present

## 2017-08-04 DIAGNOSIS — Y998 Other external cause status: Secondary | ICD-10-CM | POA: Insufficient documentation

## 2017-08-04 LAB — URINALYSIS, ROUTINE W REFLEX MICROSCOPIC
Bilirubin Urine: NEGATIVE
Glucose, UA: >=500 mg/dL — AB
Hgb urine dipstick: NEGATIVE
Ketones, ur: NEGATIVE mg/dL
Leukocytes, UA: NEGATIVE
Nitrite: NEGATIVE
Protein, ur: NEGATIVE mg/dL
Specific Gravity, Urine: 1.013 (ref 1.005–1.030)
pH: 5 (ref 5.0–8.0)

## 2017-08-04 LAB — BASIC METABOLIC PANEL
Anion gap: 9 (ref 5–15)
BUN: 20 mg/dL (ref 8–23)
CO2: 28 mmol/L (ref 22–32)
Calcium: 9.7 mg/dL (ref 8.9–10.3)
Chloride: 102 mmol/L (ref 98–111)
Creatinine, Ser: 1.72 mg/dL — ABNORMAL HIGH (ref 0.44–1.00)
GFR calc Af Amer: 32 mL/min — ABNORMAL LOW (ref >60)
GFR calc non Af Amer: 27 mL/min — ABNORMAL LOW (ref >60)
Glucose, Bld: 406 mg/dL — ABNORMAL HIGH (ref 70–99)
Potassium: 4.7 mmol/L (ref 3.5–5.1)
Sodium: 139 mmol/L (ref 135–145)

## 2017-08-04 LAB — CBC
HCT: 35.8 % — ABNORMAL LOW (ref 36.0–46.0)
Hemoglobin: 11.3 g/dL — ABNORMAL LOW (ref 12.0–15.0)
MCH: 32.1 pg (ref 26.0–34.0)
MCHC: 31.6 g/dL (ref 30.0–36.0)
MCV: 101.7 fL — ABNORMAL HIGH (ref 78.0–100.0)
Platelets: 267 10*3/uL (ref 150–400)
RBC: 3.52 MIL/uL — ABNORMAL LOW (ref 3.87–5.11)
RDW: 12.7 % (ref 11.5–15.5)
WBC: 5.9 10*3/uL (ref 4.0–10.5)

## 2017-08-04 LAB — I-STAT TROPONIN, ED: Troponin i, poc: 0 ng/mL (ref 0.00–0.08)

## 2017-08-04 LAB — CBG MONITORING, ED: Glucose-Capillary: 244 mg/dL — ABNORMAL HIGH (ref 70–99)

## 2017-08-04 MED ORDER — OXYCODONE-ACETAMINOPHEN 5-325 MG PO TABS
1.0000 | ORAL_TABLET | Freq: Once | ORAL | Status: AC
  Administered 2017-08-04: 1 via ORAL
  Filled 2017-08-04: qty 1

## 2017-08-04 NOTE — ED Notes (Signed)
Patient transported to CT 

## 2017-08-04 NOTE — ED Notes (Signed)
Interrogated Pt's pacemaker

## 2017-08-04 NOTE — ED Triage Notes (Signed)
Pt states she "blacked out," although she is unclear of the the events, she states she fell and her husband collaborates a report that he helped her from the floor to the bed. Also reports she has been experiencing chest pain for the past week or two.

## 2017-08-04 NOTE — ED Provider Notes (Signed)
[facility] DEPT Provider Note   CSN: [id number] Arrival date & time: 08/04/17  1547     History   Chief Complaint Chief Complaint  Patient presents with  . Near Syncope  . Fall    HPI Tara Jackson is a 77 y.o. female.  Patient is a 77 year old female with a history of diabetes, chronic kidney disease, diastolic dysfunction, hypertension and bradycardia status post pacemaker placement who presents with a fall.  She states that over the last year or so she has these episodes where she just falls.  She is not sure what led to this.  She does not report any lightheadedness.  She states that she just falls and she does not know what makes her fall.  She did not actually pass out.  She denies any chest pain or shortness of breath.  No palpitations.  She does report that she fell straight back onto her back and has some pain in her left upper back.  She has some chronic weakness in her right upper and lower extremity which she says is unchanged from her baseline.  She states that she has had prior TIAs.  She denies any speech deficits or vision changes.  She has glaucoma and states that her vision is always bad but she denies any change or double vision.  She does say that she hit her head when she fell but denies any headache.  No neck pain.  She does report that she felt like she had a fever this morning but denies any cough or congestion.  No rashes or wounds.  No urinary symptoms.  She has been seen by her neurologist and cardiologist in the past for these episodes and the etiology has not been determined.  The cardiologist did not feel that it was related to an arrhythmia.  Her pacemaker has been interrogated in the past.  She states that this episode is not any different than her other episodes but the reason she came in tonight was because she was hurting in her upper back.     Past Medical History:  Diagnosis Date  . Anemia   . Anxiety   . Arthritis     . Asthma   . Brittle bone disease   . Cataract   . CKD (chronic kidney disease)   . Complication of anesthesia    hard to wake up after anesthesia, trouble turning head  . Coronary artery disease, non-occlusive 2014   a. minimal CAD by cath in 2014 with 40% RI stenosis. b. low-risk NST in 11/2014.  Marland Kitchen Depression   . Diabetes mellitus type 2, insulin dependent ([place])   . Diabetic neuropathy ([place])   . Diastolic dysfunction, left ventricle 11/30/14   EF 55%, grade 1 DD  . DVT (deep venous thrombosis) ([place])    "18 in RLE; 7 LLE prior to PE" (03/24/2015)  . Family history of adverse reaction to anesthesia    " the whole family is hard to wake up"  . Glaucoma   . H/O hiatal hernia   . Hyperlipidemia   . Hypertension associated with diabetes ([place])   . Hypothyroidism   . Kidney stones    "passed them"  . Memory loss 06/03/2014  . On home oxygen therapy    "2L oxygen concentrator @ night"  . Osteoporosis   . Oxygen desaturation during sleep    wears 2 liters of oxygen at night   . Palpitations 35years ago   Cardionet monitor -  revealed mostly normal sinus rhythm, sinus bradycardia with first-degree A-V block heart rates mostly in the 50s and 60s with some 70s. No arrhythmias, PVCs or PACs noted.  . PE (pulmonary thromboembolism) ([place]) x 3, last one was ~ 2003   history of recurrent RLE DVT with PE - last PE ~>13 yrs; Maintained on Plavix  . Pneumonia   . Presence of permanent cardiac pacemaker   . Restless legs syndrome   . Sleep apnea    cpap disontinued; test done 07/14/2008 ordered per  [place]rum  . Spinal headache   . Spinal stenosis    L 3, L 4 and L 5, and C 1, C 2 and C3  . Stroke [facility]) 04-19-2013   tia and stroke. Weakness Rt hand  . 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 s/p [employer] PPM in 03/2015 by Dr. Lovena Le secondary to sinus node dysfunction.   Marland Kitchen TIA (transient ischemic attack) March 2014    Patient Active Problem List   Diagnosis Date Noted  . Orthostatic  dizziness 06/12/2017  . Diabetic neuropathy ([place]) 12/13/2016  . 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 Acute renal failure superimposed on chronic kidney disease ([place]) 10/24/2016  . Syncope 10/23/2016  . Chronic anemia 10/11/2016  . Pacemaker 04/26/2015  . Chronic daily headache 04/07/2015  . Symptomatic bradycardia 03/24/2015  . Sinus node dysfunction ([place]) 03/24/2015  . Chronic diastolic heart failure ([place])   . Diabetes mellitus type 2 in obese ([place])   . Normal coronary arteries 01/14/2015  . Normal cardiac stress test 11/30/2014  . Bradycardia 11/28/2014  . Type 2 diabetes mellitus with renal manifestations ([place]) 11/28/2014  . Morbid obesity ([place]) 11/28/2014  . CKD stage 3 due to type 2 diabetes mellitus ([place]) 11/28/2014  . Memory loss 06/03/2014  . AKI (acute kidney injury) ([place])   . Chest pain 04/19/2014  . Pleuritic chest pain 04/19/2014  . Diabetes mellitus ([place]) 04/19/2014  . Edema of both legs 04/19/2014  . Dyspnea   . SOB (shortness of breath)   . Tremor of right hand 09/20/2013  . Headache(784.0) 09/19/2013  . Restless legs syndrome (RLS) 09/19/2013  . Constipation due to pain medication 08/21/2013  . Ankle edema 08/21/2013  . PVC's (premature ventricular contractions) 02/10/2013  . Diastolic dysfunction, left ventricle   . 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 PULMONARY EMBOLISM, HX OF 10/02/2009  . ALLERGIC RHINITIS 04/07/2009  . Sleep apnea 07/09/2008  . Hyperlipidemia with target LDL less than 130 05/20/2008  . Essential hypertension 05/20/2008    Past Surgical History:  Procedure Laterality Date  . ABDOMINAL HYSTERECTOMY    . APPENDECTOMY    . BACK SURGERY     steroid inj  . CARDIAC CATHETERIZATION  02/14/2012   no evidence of obstructive coronary disease ,low EDP with normal EF  . CATARACT EXTRACTION W/ INTRAOCULAR LENS  IMPLANT, BILATERAL Bilateral 2000s  . CHOLECYSTECTOMY    . DOPPLER ECHOCARDIOGRAPHY  2014; October 2016   a. EF 37-90%; normal  diastolic pressures, no regional wall motion motion abnormalities;; b/ F 55-60%. Normal wall motion. GR 1 DD. No valve lesions  . EP IMPLANTABLE DEVICE N/A 03/24/2015   [facility] PPM, Dr. Lovena Le  . ESOPHAGOGASTRODUODENOSCOPY  08/09/2011   Procedure: ESOPHAGOGASTRODUODENOSCOPY (EGD);  Surgeon: Jeryl Columbia, MD;  Location: [facility];  Service: Endoscopy;  Laterality: N/A;  . ESOPHAGOGASTRODUODENOSCOPY (EGD) WITH PROPOFOL N/A 11/12/2013   Procedure: ESOPHAGOGASTRODUODENOSCOPY (EGD) WITH PROPOFOL;  Surgeon: Jeryl Columbia, MD;  Location: [facility] ENDOSCOPY;  Service:  Endoscopy;  Laterality: N/A;  . ESOPHAGOGASTRODUODENOSCOPY (EGD) WITH PROPOFOL N/A 05/05/2016   Procedure: ESOPHAGOGASTRODUODENOSCOPY (EGD) WITH PROPOFOL;  Surgeon: Clarene Essex, MD;  Location: [facility] ENDOSCOPY;  Service: Endoscopy;  Laterality: N/A;  . GLAUCOMA SURGERY Bilateral 2000s   "laser"  . HOT HEMOSTASIS  08/09/2011   Procedure: HOT HEMOSTASIS (ARGON PLASMA COAGULATION/BICAP);  Surgeon: Jeryl Columbia, MD;  Location: [facility];  Service: Endoscopy;  Laterality: N/A;  . HOT HEMOSTASIS N/A 11/12/2013   Procedure: HOT HEMOSTASIS (ARGON PLASMA COAGULATION/BICAP);  Surgeon: Jeryl Columbia, MD;  Location: [facility];  Service: Endoscopy;  Laterality: N/A;  . INSERT / REPLACE / REMOVE PACEMAKER  03/24/2015  . JOINT REPLACEMENT    . KNEE SURGERY Left done with 2nd left knee replacement   spacer bar placement  . LEFT HEART CATHETERIZATION WITH CORONARY ANGIOGRAM N/A 02/14/2012   Procedure: LEFT HEART CATHETERIZATION WITH CORONARY ANGIOGRAM;  Surgeon: Leonie Man, MD;  Location: [facility] CATH LAB;  Service: Cardiovascular;  Laterality: N/A;  . LEV DOPPLER  05/29/2012   RIGHT EXTREM. NORMAL VENOUS DUPLEX  . NM MYOCAR PERF WALL MOTION  01/26/2012; 11/2014   a. EF 79%,LV normal,ST SEGMENT CHANGE SUGGESTIVE OF ISCHEMIA.; LOW RISK, EF 60%. No ischemia or infarction. No wall motion abnormality   . PACEMAKER PLACEMENT    . REVISION TOTAL KNEE  ARTHROPLASTY Left   . TONSILLECTOMY    . TOTAL KNEE ARTHROPLASTY Bilateral   . TRANSTHORACIC ECHOCARDIOGRAM  10/2016   EF 65-70%.  Vigorous LV function.  Mild LVH.  Poor acoustic windows.  Cannot assess diastolic function or significant valvular dysfunction.  Marland Kitchen TRIGGER FINGER RELEASE  11/22/2011   Procedure: RELEASE TRIGGER FINGER/A-1 PULLEY;  Surgeon: Meredith Pel, MD;  Location: [facility];  Service: Orthopedics;  Laterality: Left;  Left trigger thumb release  . TRIGGER FINGER RELEASE Right yrs ago     OB History   None      Home Medications    Prior to Admission medications   Medication Sig Start Date End Date Taking? Authorizing Provider  acetaminophen (TYLENOL) 500 MG tablet Take 2 tablets (1,000 mg total) by mouth every 6 (six) hours as needed. 06/12/17  Yes Charlesetta Shanks, MD  albuterol (PROVENTIL HFA;VENTOLIN HFA) 108 (90 BASE) MCG/ACT inhaler Inhale 2 puffs into the lungs every 6 (six) hours as needed for wheezing.   Yes [provider]  busPIRone (BUSPAR) 10 MG tablet Take 1 tablet (10 mg total) by mouth every morning. 06/03/14  Yes Garvin Fila, MD  Cholecalciferol (VITAMIN D-3 PO) Take 2 tablets by mouth every morning.    Yes [provider]  clonazePAM (KLONOPIN) 0.5 MG tablet Take 0.5-1 mg by mouth 2 (two) times daily. Takes 2 tabs in am and 1 tab in pm   Yes [provider]  clopidogrel (PLAVIX) 75 MG tablet Take 75 mg by mouth every morning.   Yes [provider]  furosemide (LASIX) 20 MG tablet Take 1 tablet (20 mg total) by mouth daily. 06/12/17 09/10/17 Yes Leonie Man, MD  glipiZIDE (GLUCOTROL) 10 MG tablet Take 20 mg by mouth 2 (two) times daily before a meal.  05/03/14  Yes [provider]  insulin NPH (HUMULIN N,NOVOLIN N) 100 UNIT/ML injection Inject 0-20 Units into the skin See admin instructions. Novolin-N Give 20 unit in morning; give  5 units at 4pm; evening dose as needed.  If less than 150 blood sugar= 0units.   If greater than 200 blood sugar= give 10units to  decrease by 100 blood sugar   Yes [provider]  insulin regular (NOVOLIN R,HUMULIN R) 100 units/mL injection Inject 0-25 Units into the skin See admin instructions. Give 25 unit in morning; give 20units at 4pm; evening dose as needed.  If less than 150 blood sugar= 0units.  If greater than 200 blood sugar= give 10units to decrease by 100 blood sugar   Yes [provider]  ipratropium (ATROVENT) 0.03 % nasal spray Place 2 sprays into both nostrils every 12 (twelve) hours. Patient taking differently: Place 2 sprays into both nostrils every 12 (twelve) hours as needed for rhinitis.  10/14/16  Yes Patrecia Pour, MD  irbesartan (AVAPRO) 150 MG tablet Take 150 mg by mouth daily. 06/22/17  Yes [provider]  irbesartan (AVAPRO) 75 MG tablet Take 1 tablet (75 mg total) by mouth daily. 06/12/17  Yes Leonie Man, MD  levothyroxine (SYNTHROID, LEVOTHROID) 100 MCG tablet Take 100 mcg by mouth every morning.    Yes [provider]  lidocaine (LIDODERM) 5 % Place 3 patches onto the skin daily as needed (pain). Remove & Discard patch within 12 hours or as directed by MD   Yes [provider]  linaclotide (LINZESS) 290 MCG CAPS capsule Take 290 mcg by mouth daily before breakfast.   Yes [provider]  LORazepam (ATIVAN) 0.5 MG tablet Take 0.5 mg by mouth daily as needed for anxiety.   Yes [provider]  magnesium 30 MG tablet Take 30 mg by mouth daily.   Yes [provider]  nortriptyline (PAMELOR) 50 MG capsule TAKE 2 CAPSULES (100MG ) AT BEDTIME Patient taking differently: TAKE 1 CAPSULES (100MG ) AT two times daily 06/02/16  Yes Garvin Fila, MD  OXYGEN Inhale into the lungs. Used at overnight with a concentrator   Yes [provider]  polyethylene glycol (MIRALAX / GLYCOLAX) packet Take 17 g by mouth every other day.    Yes [provider]  potassium chloride SA  (K-DUR,KLOR-CON) 20 MEQ tablet Take 1 tablet (20 mEq total) by mouth 2 (two) times daily. 06/22/15  Yes Davonna Belling, MD  rosuvastatin (CRESTOR) 20 MG tablet  03/13/17  Yes [provider]  solifenacin (VESICARE) 10 MG tablet Take 10 mg by mouth daily as needed (overactive bladder).    Yes [provider]  terconazole (TERAZOL 7) 0.4 % vaginal cream Place 1 applicator vaginally at bedtime as needed (for yeast infection).  04/18/14  Yes [provider]  traMADol (ULTRAM) 50 MG tablet 1 to 2 tablets every 6 hours as needed for pain.  You may take this with acetaminophen. 06/12/17  Yes Charlesetta Shanks, MD  traMADol (ULTRAM) 50 MG tablet Take 1 tablet (50 mg total) by mouth every 12 (twelve) hours as needed for moderate pain. 03/27/15   Leanor Kail, PA    Family History Family History  Problem Relation Age of Onset  . Cancer Mother   . Cancer - Prostate Father     Social History Social History   Tobacco Use  . Smoking status: Never Smoker  . Smokeless tobacco: Never Used  Substance Use Topics  . Alcohol use: No    Alcohol/week: 0.0 oz  . Drug use: No     Allergies   Iodine; Iohexol; Metoclopramide hcl; Sulfa antibiotics; Lactose intolerance (gi); and Lansoprazole   Review of Systems Review of Systems  Constitutional: Negative for chills, diaphoresis, fatigue and fever.  HENT: Negative for congestion, rhinorrhea and sneezing.   Eyes: Negative.  Respiratory: Negative for cough, chest tightness and shortness of breath.   Cardiovascular: Negative for chest pain and leg swelling.  Gastrointestinal: Negative for abdominal pain, blood in stool, diarrhea, nausea and vomiting.  Genitourinary: Negative for difficulty urinating, flank pain, frequency and hematuria.  Musculoskeletal: Positive for back pain. Negative for arthralgias.  Skin: Negative for rash.  Neurological: Negative for dizziness, speech difficulty, weakness, numbness and headaches.      Physical Exam Updated Vital Signs BP (!) 139/57 (BP Location: Left Arm)   Pulse 60   Temp 98.5 F (36.9 C) (Oral)   Resp 19   Ht 5' 7.5" (1.715 m)   Wt 120.2 kg (265 lb)   SpO2 100%   BMI 40.89 kg/m   Physical Exam  Constitutional: She is oriented to person, place, and time. She appears well-developed and well-nourished.  HENT:  Head: Normocephalic and atraumatic.  Eyes: Pupils are equal, round, and reactive to light.  Neck: Normal range of motion. Neck supple.  Cardiovascular: Normal rate, regular rhythm and normal heart sounds.  Pulmonary/Chest: Effort normal and breath sounds normal. No respiratory distress. She has no wheezes. She has no rales. She exhibits no tenderness.  Abdominal: Soft. Bowel sounds are normal. There is no tenderness. There is no rebound and no guarding.  Musculoskeletal: Normal range of motion. She exhibits no edema.  Patient has tenderness to her left upper back over the lower rib cage.  There is no crepitus or deformity.  No signs of external trauma.  There is no pain along the cervical thoracic or lumbosacral spine.  No pain on palpation or range of motion of the extremities.  Lymphadenopathy:    She has no cervical adenopathy.  Neurological: She is alert and oriented to person, place, and time.  Patient reports some subjective weakness in the right upper and lower extremity but I do not appreciate any difference in strength.  She has normal sensation to light touch in all extremities.  No facial drooping.  No visual field deficits.  No speech deficits.  Skin: Skin is warm and dry. No rash noted.  Psychiatric: She has a normal mood and affect.     ED Treatments / Results  Labs (all labs ordered are listed, but only abnormal results are displayed) Labs Reviewed  BASIC METABOLIC PANEL - Abnormal; Notable for the following components:      Result Value   Glucose, Bld 406 (*)    Creatinine, Ser 1.72 (*)    GFR calc non Af Amer 27 (*)    GFR  calc Af Amer 32 (*)    All other components within normal limits  CBC - Abnormal; Notable for the following components:   RBC 3.52 (*)    Hemoglobin 11.3 (*)    HCT 35.8 (*)    MCV 101.7 (*)    All other components within normal limits  URINALYSIS, ROUTINE W REFLEX MICROSCOPIC - Abnormal; Notable for the following components:   Glucose, UA >=500 (*)    Bacteria, UA MANY (*)    All other components within normal limits  CBG MONITORING, ED - Abnormal; Notable for the following components:   Glucose-Capillary 244 (*)    All other components within normal limits  I-STAT TROPONIN, ED    EKG EKG Interpretation  Date/Time:  Friday August 04 2017 16:18:08 EDT Ventricular Rate:  61 PR Interval:    QRS Duration: 90 QT Interval:  531 QTC Calculation: 535 R Axis:   13 Text Interpretation:  Atrial-paced complexes  Prolonged PR interval Low voltage, precordial leads Borderline T abnormalities, diffuse leads Prolonged QT interval Baseline wander in lead(s) II III aVF Confirmed by Malvin Johns [phone]) on 08/04/2017 6:57:07 PM   Radiology Dg Ribs Unilateral W/chest Left  Result Date: 08/04/2017 CLINICAL DATA:  Post syncopal episode. EXAM: LEFT RIBS AND CHEST - 3+ VIEW COMPARISON:  06/12/2017 FINDINGS: Dual lead cardiac pacemaker in stable position. Left lower posterior rib pain. Mildly enlarged cardiac silhouette. Mediastinal contours appear intact. There is no evidence of focal airspace consolidation, pleural effusion or pneumothorax. Osseous structures are without acute abnormality. No rib fractures seen. Soft tissues are grossly normal. IMPRESSION: Mildly enlarged cardiac silhouette. No radiographic evidence of displaced rib fractures. Electronically Signed   By: Fidela Salisbury M.D.   On: 08/04/2017 20:29   Ct Head Wo Contrast  Result Date: 08/04/2017 CLINICAL DATA:  Status post syncopal episode. EXAM: CT HEAD WITHOUT CONTRAST CT CERVICAL SPINE WITHOUT CONTRAST TECHNIQUE: Multidetector CT  imaging of the head and cervical spine was performed following the standard protocol without intravenous contrast. Multiplanar CT image reconstructions of the cervical spine were also generated. COMPARISON:  10/23/2016 FINDINGS: CT HEAD FINDINGS Brain: No evidence of acute infarction, hemorrhage, hydrocephalus, extra-axial collection or mass lesion/mass effect. 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 Skull: Normal. Negative for fracture or focal lesion. Sinuses/Orbits: No acute finding. Other: None. CT CERVICAL SPINE FINDINGS Alignment: Straightening of the cervical lordosis. Skull base and vertebrae: No acute fracture. No primary bone lesion or focal pathologic process. Soft tissues and spinal canal: No prevertebral fluid or swelling. No visible canal hematoma. Disc levels:  C3-C4 spondylosis. Upper chest: None. Other: None. IMPRESSION: No acute intracranial abnormality. No evidence of acute traumatic injury to cervical spine. C3-C4 spondylosis. Electronically Signed   By: Fidela Salisbury M.D.   On: 08/04/2017 20:12   Ct Cervical Spine Wo Contrast  Result Date: 08/04/2017 CLINICAL DATA:  Status post syncopal episode. EXAM: CT HEAD WITHOUT CONTRAST CT CERVICAL SPINE WITHOUT CONTRAST TECHNIQUE: Multidetector CT imaging of the head and cervical spine was performed following the standard protocol without intravenous contrast. Multiplanar CT image reconstructions of the cervical spine were also generated. COMPARISON:  10/23/2016 FINDINGS: CT HEAD FINDINGS Brain: No evidence of acute infarction, hemorrhage, hydrocephalus, extra-axial collection or mass lesion/mass effect. 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 Skull: Normal. Negative for fracture or focal lesion. Sinuses/Orbits: No acute finding. Other: None. CT CERVICAL SPINE FINDINGS Alignment: Straightening of the cervical lordosis. Skull base and vertebrae: No acute  fracture. No primary bone lesion or focal pathologic process. Soft tissues and spinal canal: No prevertebral fluid or swelling. No visible canal hematoma. Disc levels:  C3-C4 spondylosis. Upper chest: None. Other: None. IMPRESSION: No acute intracranial abnormality. No evidence of acute traumatic injury to cervical spine. C3-C4 spondylosis. Electronically Signed   By: Fidela Salisbury M.D.   On: 08/04/2017 20:12    Procedures Procedures (including critical care time)  Medications Ordered in ED Medications  oxyCODONE-acetaminophen (PERCOCET/ROXICET) 5-325 MG per tablet 1 tablet (1 tablet Oral Given 08/04/17 2023)     Initial Impression / Assessment and Plan / ED Course  I have reviewed the triage vital signs and the nursing notes.  Pertinent labs & imaging results that were available during my care of the patient were reviewed by me and considered in my medical decision making (see chart for details).     Patient is a 77 year old female who presents after a fall at home.  She has had similar falls in the past.  She has near syncopal type episodes.  She is been seen by cardiology and neurology.  She did have her blood pressure medications lowered as it was felt to be related to her blood pressure getting too low.  However she is continuing to have these episodes.  Her labs today are non-concerning.  Her creatinine is mildly elevated but similar to her prior values.  Her hemoglobin is stable.  She does not have any focal neurologic deficits.  She has no chest pain or suggestions of an arrhythmia.  Her pacemaker was interrogated without evidence of arrhythmias.  Given the fall, she had a CT scan of her head and cervical spine which showed no acute abnormalities.  X-rays of her ribs show no acute fracture or pneumothorax.  She has no spinal tenderness.  She was discharged home in good condition.  She was advised to follow-up with her primary care physician for recheck.  She has tramadol at home to  use for pain.  Return precautions were given.  Final Clinical Impressions(s) / ED Diagnoses   Final diagnoses:  Near syncope  Rib contusion, left, initial encounter    ED Discharge Orders    None       Malvin Johns, MD 08/04/17 2224

## 2017-08-04 NOTE — ED Notes (Signed)
ED Provider at bedside. 

## 2017-08-14 DIAGNOSIS — G894 Chronic pain syndrome: Secondary | ICD-10-CM | POA: Diagnosis not present

## 2017-08-14 DIAGNOSIS — M150 Primary generalized (osteo)arthritis: Secondary | ICD-10-CM | POA: Diagnosis not present

## 2017-08-14 DIAGNOSIS — E1142 Type 2 diabetes mellitus with diabetic polyneuropathy: Secondary | ICD-10-CM | POA: Diagnosis not present

## 2017-08-14 DIAGNOSIS — M47816 Spondylosis without myelopathy or radiculopathy, lumbar region: Secondary | ICD-10-CM | POA: Diagnosis not present

## 2017-08-16 DIAGNOSIS — I509 Heart failure, unspecified: Secondary | ICD-10-CM | POA: Diagnosis not present

## 2017-08-16 DIAGNOSIS — W19XXXD Unspecified fall, subsequent encounter: Secondary | ICD-10-CM | POA: Diagnosis not present

## 2017-08-16 DIAGNOSIS — R69 Illness, unspecified: Secondary | ICD-10-CM | POA: Diagnosis not present

## 2017-08-16 DIAGNOSIS — Z6841 Body Mass Index (BMI) 40.0 and over, adult: Secondary | ICD-10-CM | POA: Diagnosis not present

## 2017-08-16 DIAGNOSIS — Z794 Long term (current) use of insulin: Secondary | ICD-10-CM | POA: Diagnosis not present

## 2017-08-16 DIAGNOSIS — E1140 Type 2 diabetes mellitus with diabetic neuropathy, unspecified: Secondary | ICD-10-CM | POA: Diagnosis not present

## 2017-08-16 DIAGNOSIS — I129 Hypertensive chronic kidney disease with stage 1 through stage 4 chronic kidney disease, or unspecified chronic kidney disease: Secondary | ICD-10-CM | POA: Diagnosis not present

## 2017-08-16 DIAGNOSIS — R0602 Shortness of breath: Secondary | ICD-10-CM | POA: Diagnosis not present

## 2017-08-16 DIAGNOSIS — M545 Low back pain: Secondary | ICD-10-CM | POA: Diagnosis not present

## 2017-08-21 DIAGNOSIS — I509 Heart failure, unspecified: Secondary | ICD-10-CM | POA: Diagnosis not present

## 2017-08-21 DIAGNOSIS — I10 Essential (primary) hypertension: Secondary | ICD-10-CM | POA: Diagnosis not present

## 2017-08-23 ENCOUNTER — Emergency Department (HOSPITAL_COMMUNITY): Payer: Medicare HMO

## 2017-08-23 ENCOUNTER — Encounter (HOSPITAL_COMMUNITY): Payer: Self-pay | Admitting: Emergency Medicine

## 2017-08-23 ENCOUNTER — Other Ambulatory Visit: Payer: Self-pay

## 2017-08-23 ENCOUNTER — Emergency Department (HOSPITAL_COMMUNITY)
Admission: EM | Admit: 2017-08-23 | Discharge: 2017-08-24 | Disposition: A | Discharge status: Home / Self Care | Payer: Medicare HMO | Attending: Emergency Medicine | Admitting: Emergency Medicine

## 2017-08-23 DIAGNOSIS — W07XXXA Fall from chair, initial encounter: Secondary | ICD-10-CM | POA: Insufficient documentation

## 2017-08-23 DIAGNOSIS — I5032 Chronic diastolic (congestive) heart failure: Secondary | ICD-10-CM | POA: Insufficient documentation

## 2017-08-23 DIAGNOSIS — N183 Chronic kidney disease, stage 3 (moderate): Secondary | ICD-10-CM | POA: Diagnosis not present

## 2017-08-23 DIAGNOSIS — E119 Type 2 diabetes mellitus without complications: Secondary | ICD-10-CM | POA: Diagnosis not present

## 2017-08-23 DIAGNOSIS — M549 Dorsalgia, unspecified: Secondary | ICD-10-CM | POA: Diagnosis not present

## 2017-08-23 DIAGNOSIS — S79911A Unspecified injury of right hip, initial encounter: Secondary | ICD-10-CM | POA: Diagnosis not present

## 2017-08-23 DIAGNOSIS — Z79899 Other long term (current) drug therapy: Secondary | ICD-10-CM | POA: Diagnosis not present

## 2017-08-23 DIAGNOSIS — R51 Headache: Secondary | ICD-10-CM | POA: Insufficient documentation

## 2017-08-23 DIAGNOSIS — S3992XA Unspecified injury of lower back, initial encounter: Secondary | ICD-10-CM | POA: Diagnosis not present

## 2017-08-23 DIAGNOSIS — I130 Hypertensive heart and chronic kidney disease with heart failure and stage 1 through stage 4 chronic kidney disease, or unspecified chronic kidney disease: Secondary | ICD-10-CM | POA: Insufficient documentation

## 2017-08-23 DIAGNOSIS — R05 Cough: Secondary | ICD-10-CM | POA: Diagnosis not present

## 2017-08-23 DIAGNOSIS — J45909 Unspecified asthma, uncomplicated: Secondary | ICD-10-CM | POA: Diagnosis not present

## 2017-08-23 DIAGNOSIS — E039 Hypothyroidism, unspecified: Secondary | ICD-10-CM | POA: Insufficient documentation

## 2017-08-23 DIAGNOSIS — W19XXXA Unspecified fall, initial encounter: Secondary | ICD-10-CM

## 2017-08-23 DIAGNOSIS — Z794 Long term (current) use of insulin: Secondary | ICD-10-CM | POA: Diagnosis not present

## 2017-08-23 DIAGNOSIS — I959 Hypotension, unspecified: Secondary | ICD-10-CM | POA: Diagnosis not present

## 2017-08-23 DIAGNOSIS — Z96652 Presence of left artificial knee joint: Secondary | ICD-10-CM | POA: Insufficient documentation

## 2017-08-23 DIAGNOSIS — S199XXA Unspecified injury of neck, initial encounter: Secondary | ICD-10-CM | POA: Diagnosis not present

## 2017-08-23 DIAGNOSIS — S0990XA Unspecified injury of head, initial encounter: Secondary | ICD-10-CM | POA: Diagnosis not present

## 2017-08-23 DIAGNOSIS — M25551 Pain in right hip: Secondary | ICD-10-CM | POA: Diagnosis not present

## 2017-08-23 NOTE — ED Notes (Addendum)
Patient transported to X-ray 

## 2017-08-23 NOTE — ED Provider Notes (Signed)
[place] EMERGENCY DEPARTMENT Provider Note   CSN: [id number] Arrival date & time: 08/23/17  1939   History   Chief Complaint Chief Complaint  Patient presents with  . Fall    HPI Tara Jackson is a 77 y.o. female with extensive past medical hx as listed below who presents to the ED s/p fall x 2 today with complaints of headache and back pain. Patient states that she has difficulty with ambulation/balance at baseline, she typically gets around with a wheel chair and chair lift, and is instructed to use a cane. She states she does not feel comfortable using the cane and therefore frequently does not.  She reports that today she had a fall when transferring from lift to chair to the ground, she hit her head on the right side on the floor which is wooden.  She subsequently had another fall later in the day during which she was walking from the bathroom to her bedroom without her cane where she fell backwards.  Each of these fall she states she felt like her legs gave out causing fall, this has happened before.  There was no prodrome of dyspnea, chest pain, lightheadedness, or dizziness, there were no syncopal episodes. Per her husband she has frequent falls at home, this is not a new problem, no home health/PT/OT currently. She did not have any LOC with these falls. First fall family was able to help her get back up, 2nd fall they were unable prompting ER visit. Patient's pain is to R side of head and back. Rates her pain an 8/10, no specific alleviating/aggravating factors. Denies change in vision, numbness, weakness, incontinence, or abdominal pain. Patient is on plavix.   Additionally mentioned during interview: patient with URI sxs including congestion, rhinorrhea, bilateral ear pressure, and productive cough with white mucous sputum. She is concerned about pneumonia. No specific alleviating/aggravating factors. Some trouble breathing through nose, not dyspneic. Reports  subjective fevers, no measured temps.    HPI  Past Medical History:  Diagnosis Date  . Anemia   . Anxiety   . Arthritis   . Asthma   . Brittle bone disease   . Cataract   . CKD (chronic kidney disease)   . Complication of anesthesia    hard to wake up after anesthesia, trouble turning head  . Coronary artery disease, non-occlusive 2014   a. minimal CAD by cath in 2014 with 40% RI stenosis. b. low-risk NST in 11/2014.  Marland Kitchen Depression   . Diabetes mellitus type 2, insulin dependent ([place])   . Diabetic neuropathy ([place])   . Diastolic dysfunction, left ventricle 11/30/14   EF 55%, grade 1 DD  . DVT (deep venous thrombosis) ([place])    "18 in RLE; 7 LLE prior to PE" (03/24/2015)  . Family history of adverse reaction to anesthesia    " the whole family is hard to wake up"  . Glaucoma   . H/O hiatal hernia   . Hyperlipidemia   . Hypertension associated with diabetes ([place])   . Hypothyroidism   . Kidney stones    "passed them"  . Memory loss 06/03/2014  . On home oxygen therapy    "2L oxygen concentrator @ night"  . Osteoporosis   . Oxygen desaturation during sleep    wears 2 liters of oxygen at night   . Palpitations 35years ago   Cardionet monitor - revealed mostly normal sinus rhythm, sinus bradycardia with first-degree A-V block heart rates mostly in the 50s  and 60s with some 70s. No arrhythmias, PVCs or PACs noted.  . PE (pulmonary thromboembolism) ([place]) x 3, last one was ~ 2003   history of recurrent RLE DVT with PE - last PE ~>13 yrs; Maintained on Plavix  . Pneumonia   . Presence of permanent cardiac pacemaker   . Restless legs syndrome   . Sleep apnea    cpap disontinued; test done 07/14/2008 ordered per  [place]rum  . Spinal headache   . Spinal stenosis    L 3, L 4 and L 5, and C 1, C 2 and C3  . Stroke [facility]) 04-19-2013   tia and stroke. Weakness Rt hand  . 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 s/p [employer] PPM in 03/2015 by Dr. Lovena Le secondary to sinus node  dysfunction.   Marland Kitchen TIA (transient ischemic attack) March 2014    Patient Active Problem List   Diagnosis Date Noted  . Orthostatic dizziness 06/12/2017  . Diabetic neuropathy ([place]) 12/13/2016  . 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 Acute renal failure superimposed on chronic kidney disease ([place]) 10/24/2016  . Syncope 10/23/2016  . Chronic anemia 10/11/2016  . Pacemaker 04/26/2015  . Chronic daily headache 04/07/2015  . Symptomatic bradycardia 03/24/2015  . Sinus node dysfunction ([place]) 03/24/2015  . Chronic diastolic heart failure ([place])   . Diabetes mellitus type 2 in obese ([place])   . Normal coronary arteries 01/14/2015  . Normal cardiac stress test 11/30/2014  . Bradycardia 11/28/2014  . Type 2 diabetes mellitus with renal manifestations ([place]) 11/28/2014  . Morbid obesity ([place]) 11/28/2014  . CKD stage 3 due to type 2 diabetes mellitus ([place]) 11/28/2014  . Memory loss 06/03/2014  . AKI (acute kidney injury) ([place])   . Chest pain 04/19/2014  . Pleuritic chest pain 04/19/2014  . Diabetes mellitus ([place]) 04/19/2014  . Edema of both legs 04/19/2014  . Dyspnea   . SOB (shortness of breath)   . Tremor of right hand 09/20/2013  . Headache(784.0) 09/19/2013  . Restless legs syndrome (RLS) 09/19/2013  . Constipation due to pain medication 08/21/2013  . Ankle edema 08/21/2013  . PVC's (premature ventricular contractions) 02/10/2013  . Diastolic dysfunction, left ventricle   . 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 PULMONARY EMBOLISM, HX OF 10/02/2009  . ALLERGIC RHINITIS 04/07/2009  . Sleep apnea 07/09/2008  . Hyperlipidemia with target LDL less than 130 05/20/2008  . Essential hypertension 05/20/2008    Past Surgical History:  Procedure Laterality Date  . ABDOMINAL HYSTERECTOMY    . APPENDECTOMY    . BACK SURGERY     steroid inj  . CARDIAC CATHETERIZATION  02/14/2012   no evidence of obstructive coronary disease ,low EDP with normal EF  . CATARACT EXTRACTION W/ INTRAOCULAR LENS   IMPLANT, BILATERAL Bilateral 2000s  . CHOLECYSTECTOMY    . DOPPLER ECHOCARDIOGRAPHY  2014; October 2016   a. EF 67-89%; normal diastolic pressures, no regional wall motion motion abnormalities;; b/ F 55-60%. Normal wall motion. GR 1 DD. No valve lesions  . EP IMPLANTABLE DEVICE N/A 03/24/2015   [facility] [employer] PPM, Dr. Lovena Le  . ESOPHAGOGASTRODUODENOSCOPY  08/09/2011   Procedure: ESOPHAGOGASTRODUODENOSCOPY (EGD);  Surgeon: Jeryl Columbia, MD;  Location: [facility];  Service: Endoscopy;  Laterality: N/A;  . ESOPHAGOGASTRODUODENOSCOPY (EGD) WITH PROPOFOL N/A 11/12/2013   Procedure: ESOPHAGOGASTRODUODENOSCOPY (EGD) WITH PROPOFOL;  Surgeon: Jeryl Columbia, MD;  Location: [facility] ENDOSCOPY;  Service: Endoscopy;  Laterality: N/A;  . ESOPHAGOGASTRODUODENOSCOPY (EGD) WITH PROPOFOL N/A 05/05/2016   Procedure: ESOPHAGOGASTRODUODENOSCOPY (EGD)  WITH PROPOFOL;  Surgeon: Clarene Essex, MD;  Location: [facility] ENDOSCOPY;  Service: Endoscopy;  Laterality: N/A;  . GLAUCOMA SURGERY Bilateral 2000s   "laser"  . HOT HEMOSTASIS  08/09/2011   Procedure: HOT HEMOSTASIS (ARGON PLASMA COAGULATION/BICAP);  Surgeon: Jeryl Columbia, MD;  Location: [facility];  Service: Endoscopy;  Laterality: N/A;  . HOT HEMOSTASIS N/A 11/12/2013   Procedure: HOT HEMOSTASIS (ARGON PLASMA COAGULATION/BICAP);  Surgeon: Jeryl Columbia, MD;  Location: [facility];  Service: Endoscopy;  Laterality: N/A;  . INSERT / REPLACE / REMOVE PACEMAKER  03/24/2015  . JOINT REPLACEMENT    . KNEE SURGERY Left done with 2nd left knee replacement   spacer bar placement  . LEFT HEART CATHETERIZATION WITH CORONARY ANGIOGRAM N/A 02/14/2012   Procedure: LEFT HEART CATHETERIZATION WITH CORONARY ANGIOGRAM;  Surgeon: Leonie Man, MD;  Location: [facility] CATH LAB;  Service: Cardiovascular;  Laterality: N/A;  . LEV DOPPLER  05/29/2012   RIGHT EXTREM. NORMAL VENOUS DUPLEX  . NM MYOCAR PERF WALL MOTION  01/26/2012; 11/2014   a. EF 79%,LV normal,ST SEGMENT CHANGE SUGGESTIVE OF ISCHEMIA.; LOW  RISK, EF 60%. No ischemia or infarction. No wall motion abnormality   . PACEMAKER PLACEMENT    . REVISION TOTAL KNEE ARTHROPLASTY Left   . TONSILLECTOMY    . TOTAL KNEE ARTHROPLASTY Bilateral   . TRANSTHORACIC ECHOCARDIOGRAM  10/2016   EF 65-70%.  Vigorous LV function.  Mild LVH.  Poor acoustic windows.  Cannot assess diastolic function or significant valvular dysfunction.  Marland Kitchen TRIGGER FINGER RELEASE  11/22/2011   Procedure: RELEASE TRIGGER FINGER/A-1 PULLEY;  Surgeon: Meredith Pel, MD;  Location: [employer] [facility];  Service: Orthopedics;  Laterality: Left;  Left trigger thumb release  . TRIGGER FINGER RELEASE Right yrs ago     OB History   None      Home Medications    Prior to Admission medications   Medication Sig Start Date End Date Taking? Authorizing Provider  acetaminophen (TYLENOL) 500 MG tablet Take 2 tablets (1,000 mg total) by mouth every 6 (six) hours as needed. 06/12/17   Charlesetta Shanks, MD  albuterol (PROVENTIL HFA;VENTOLIN HFA) 108 (90 BASE) MCG/ACT inhaler Inhale 2 puffs into the lungs every 6 (six) hours as needed for wheezing.    [provider]  busPIRone (BUSPAR) 10 MG tablet Take 1 tablet (10 mg total) by mouth every morning. 06/03/14   Garvin Fila, MD  Cholecalciferol (VITAMIN D-3 PO) Take 2 tablets by mouth every morning.     [provider]  clonazePAM (KLONOPIN) 0.5 MG tablet Take 0.5-1 mg by mouth 2 (two) times daily. Takes 2 tabs in am and 1 tab in pm    [provider]  clopidogrel (PLAVIX) 75 MG tablet Take 75 mg by mouth every morning.    [provider]  furosemide (LASIX) 20 MG tablet Take 1 tablet (20 mg total) by mouth daily. 06/12/17 09/10/17  Leonie Man, MD  glipiZIDE (GLUCOTROL) 10 MG tablet Take 20 mg by mouth 2 (two) times daily before a meal.  05/03/14   [provider]  insulin NPH (HUMULIN N,NOVOLIN N) 100 UNIT/ML injection Inject 0-20 Units into the skin See admin instructions. Novolin-N Give 20 unit in  morning; give  5 units at 4pm; evening dose as needed.  If less than 150 blood sugar= 0units.  If greater than 200 blood sugar= give 10units to decrease by 100 blood sugar    [provider]  insulin regular (NOVOLIN R,HUMULIN R)  100 units/mL injection Inject 0-25 Units into the skin See admin instructions. Give 25 unit in morning; give 20units at 4pm; evening dose as needed.  If less than 150 blood sugar= 0units.  If greater than 200 blood sugar= give 10units to decrease by 100 blood sugar    [provider]  ipratropium (ATROVENT) 0.03 % nasal spray Place 2 sprays into both nostrils every 12 (twelve) hours. Patient taking differently: Place 2 sprays into both nostrils every 12 (twelve) hours as needed for rhinitis.  10/14/16   Patrecia Pour, MD  irbesartan (AVAPRO) 150 MG tablet Take 150 mg by mouth daily. 06/22/17   [provider]  irbesartan (AVAPRO) 75 MG tablet Take 1 tablet (75 mg total) by mouth daily. 06/12/17   Leonie Man, MD  levothyroxine (SYNTHROID, LEVOTHROID) 100 MCG tablet Take 100 mcg by mouth every morning.     [provider]  lidocaine (LIDODERM) 5 % Place 3 patches onto the skin daily as needed (pain). Remove & Discard patch within 12 hours or as directed by MD    [provider]  linaclotide (LINZESS) 290 MCG CAPS capsule Take 290 mcg by mouth daily before breakfast.    [provider]  LORazepam (ATIVAN) 0.5 MG tablet Take 0.5 mg by mouth daily as needed for anxiety.    [provider]  magnesium 30 MG tablet Take 30 mg by mouth daily.    [provider]  nortriptyline (PAMELOR) 50 MG capsule TAKE 2 CAPSULES (100MG ) AT BEDTIME Patient taking differently: TAKE 1 CAPSULES (100MG ) AT two times daily 06/02/16   Garvin Fila, MD  OXYGEN Inhale into the lungs. Used at overnight with a Cytogeneticist, Historical, MD  polyethylene glycol (MIRALAX / GLYCOLAX) packet Take 17 g by mouth every other day.      [provider]  potassium chloride SA (K-DUR,KLOR-CON) 20 MEQ tablet Take 1 tablet (20 mEq total) by mouth 2 (two) times daily. 06/22/15   Davonna Belling, MD  rosuvastatin (CRESTOR) 20 MG tablet  03/13/17   [provider]  solifenacin (VESICARE) 10 MG tablet Take 10 mg by mouth daily as needed (overactive bladder).     [provider]  terconazole (TERAZOL 7) 0.4 % vaginal cream Place 1 applicator vaginally at bedtime as needed (for yeast infection).  04/18/14   [provider]  traMADol (ULTRAM) 50 MG tablet Take 1 tablet (50 mg total) by mouth every 12 (twelve) hours as needed for moderate pain. 03/27/15   Bhagat, Crista Luria, PA  traMADol (ULTRAM) 50 MG tablet 1 to 2 tablets every 6 hours as needed for pain.  You may take this with acetaminophen. 06/12/17   Charlesetta Shanks, MD    Family History Family History  Problem Relation Age of Onset  . Cancer Mother   . Cancer - Prostate Father     Social History Social History   Tobacco Use  . Smoking status: Never Smoker  . Smokeless tobacco: Never Used  Substance Use Topics  . Alcohol use: No    Alcohol/week: 0.0 oz  . Drug use: No     Allergies   Iodine; Iohexol; Metoclopramide hcl; Sulfa antibiotics; Lactose intolerance (gi); and Lansoprazole   Review of Systems Review of Systems  Constitutional: Positive for fever (subjective).  HENT: Positive for congestion, ear pain and rhinorrhea. Negative for trouble swallowing and voice change.   Respiratory: Positive for cough. Negative for shortness of breath.   Cardiovascular: Negative for  chest pain, palpitations and leg swelling.  Gastrointestinal: Negative for abdominal pain, nausea and vomiting.  Musculoskeletal: Positive for back pain.  Neurological: Positive for headaches. Negative for dizziness, syncope, speech difficulty, weakness and numbness.  All other systems reviewed and are negative.  Physical Exam Updated Vital Signs BP (!) 133/51    Pulse 70   Temp 98.1 F (36.7 C) (Oral)   Resp 16   Ht 5\' 7"  (1.702 m)   Wt 122.5 kg (270 lb)   SpO2 100%   BMI 42.29 kg/m   Physical Exam  Constitutional: She appears well-developed and well-nourished.  Non-toxic appearance. No distress.  HENT:  Head: Normocephalic and atraumatic. Head is without raccoon's eyes and without Battle's sign.  Right Ear: Tympanic membrane normal. No drainage. Tympanic membrane is not perforated, not erythematous, not retracted and not bulging. No hemotympanum.  Left Ear: Tympanic membrane normal. No drainage. Tympanic membrane is not perforated, not erythematous, not retracted and not bulging. No hemotympanum.  Nose: Mucosal edema present. Right sinus exhibits no maxillary sinus tenderness and no frontal sinus tenderness. Left sinus exhibits no maxillary sinus tenderness and no frontal sinus tenderness.  Mouth/Throat: Uvula is midline and oropharynx is clear and moist.  No palpable facial bony instability.   Eyes: Pupils are equal, round, and reactive to light. Conjunctivae and EOM are normal. Right eye exhibits no discharge. Left eye exhibits no discharge.  Neck: Normal range of motion. Neck supple. Muscular tenderness (Right sided) present. No spinous process tenderness present.  Cardiovascular: Normal rate and regular rhythm.  No murmur heard. Pulses:      Radial pulses are 2+ on the right side, and 2+ on the left side.       Dorsalis pedis pulses are 2+ on the right side, and 2+ on the left side.  Pulmonary/Chest: Effort normal and breath sounds normal. No respiratory distress. She has no wheezes. She has no rhonchi. She has no rales.  No bruising to chest wall, abdomen, or back.  Abdominal: Soft. She exhibits no distension. There is no tenderness. There is no rigidity, no rebound and no guarding.  Musculoskeletal:  No obvious deformity, appreciable swelling, erythema, ecchymosis, or open wounds Upper extremities: Patient has normal range of motion  at all joints.  Her upper extremities are nontender Back: Patient has some upper lumbar diffuse tenderness to palpation, no point/focal vertebral tenderness.  No palpable bony step-off. Lower extremities: Normal range of motion in all joints. Patient has some tenderness to palpation over the right greater trochanter and right posterior pelvis.  Otherwise no bony tenderness to palpation.    Neurological:  Alert.  Clear speech.  CN III through XII grossly intact.  Normal finger-to-nose bilaterally.  Negative pronator drift.  Symmetric 5 out of 5 grip strength.  Symmetric 5 out of 5 strength with plantar dorsiflexion bilaterally.  Patient is able to lift both of her legs off the bed without difficulty.  Skin: Skin is warm and dry. No rash noted.  Psychiatric: She has a normal mood and affect. Her behavior is normal.  Nursing note and vitals reviewed.    ED Treatments / Results  Labs (all labs ordered are listed, but only abnormal results are displayed) Labs Reviewed - No data to display  EKG None  Radiology Dg Chest 2 View  Result Date: 08/23/2017 CLINICAL DATA:  Fall at home.  Productive cough for 2 days. EXAM: CHEST - 2 VIEW COMPARISON:  08/04/2017 FINDINGS: Dual lead pacer remains in place. Mild-to-moderate enlargement of  the cardiopericardial silhouette, without edema. The lungs appear clear. Mild thoracic spondylosis.  No pleural effusion. IMPRESSION: 1. Mild-to-moderate enlargement of the cardiopericardial silhouette, without edema. 2. Dual lead pacer noted. Electronically Signed   By: Van Clines M.D.   On: 08/23/2017 23:41   Dg Lumbar Spine Complete  Result Date: 08/23/2017 CLINICAL DATA:  Fall at home yesterday.  Right lateral hip pain. EXAM: LUMBAR SPINE - COMPLETE 4+ VIEW COMPARISON:  07/05/2017 FINDINGS: Extensive gas throughout the bowel. Bony demineralization. Degenerative facet arthropathy bilaterally at L3-4, L4-5, and L5-S1. 2.5 mm of degenerative anterolisthesis at  L4-5. No fracture or acute subluxation is identified. IMPRESSION: 1. Lower lumbar degenerative facet arthropathy. No acute lumbar spine findings. 2. Bony demineralization. Electronically Signed   By: Van Clines M.D.   On: 08/23/2017 23:36   Ct Head Wo Contrast  Result Date: 08/23/2017 CLINICAL DATA:  Complain of 2 falls today. Right arm pain and right-sided headache. EXAM: CT HEAD WITHOUT CONTRAST CT CERVICAL SPINE WITHOUT CONTRAST TECHNIQUE: Multidetector CT imaging of the head and cervical spine was performed following the standard protocol without intravenous contrast. Multiplanar CT image reconstructions of the cervical spine were also generated. COMPARISON:  08/04/2017 FINDINGS: CT HEAD FINDINGS Brain: Stable involutional changes of the brain in keeping with the patient's age. Chronic minimal small vessel ischemic disease. No acute intracranial hemorrhage, midline shift or edema. No intra-axial mass nor extra-axial fluid collections. No hydrocephalus. Midline fourth ventricle and basal cisterns. Vascular: No hyperdense vessel sign. Skull: Intact Sinuses/Orbits: Intact orbits and globes.  Clear paranasal sinuses. Other: Clear mastoids. CT CERVICAL SPINE FINDINGS Alignment: Normal. Skull base and vertebrae: No acute fracture. No primary bone lesion or focal pathologic process. Soft tissues and spinal canal: No prevertebral fluid or swelling. No visible canal hematoma. Disc levels: No significant central foraminal stenosis. 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 Upper chest: Negative. Other: None IMPRESSION: CT head: 1. No acute intracranial abnormality. 2. Chronic minimal small vessel ischemic disease. 3. Stable involutional changes the brain likely age related. CT cervical spine: No acute cervical spine fracture or posttraumatic listhesis. No prevertebral soft tissue swelling. Electronically Signed   By: Ashley Royalty M.D.   On: 08/23/2017 22:13   Ct Cervical Spine Wo Contrast  Result Date:  08/23/2017 CLINICAL DATA:  Complain of 2 falls today. Right arm pain and right-sided headache. EXAM: CT HEAD WITHOUT CONTRAST CT CERVICAL SPINE WITHOUT CONTRAST TECHNIQUE: Multidetector CT imaging of the head and cervical spine was performed following the standard protocol without intravenous contrast. Multiplanar CT image reconstructions of the cervical spine were also generated. COMPARISON:  08/04/2017 FINDINGS: CT HEAD FINDINGS Brain: Stable involutional changes of the brain in keeping with the patient's age. Chronic minimal small vessel ischemic disease. No acute intracranial hemorrhage, midline shift or edema. No intra-axial mass nor extra-axial fluid collections. No hydrocephalus. Midline fourth ventricle and basal cisterns. Vascular: No hyperdense vessel sign. Skull: Intact Sinuses/Orbits: Intact orbits and globes.  Clear paranasal sinuses. Other: Clear mastoids. CT CERVICAL SPINE FINDINGS Alignment: Normal. Skull base and vertebrae: No acute fracture. No primary bone lesion or focal pathologic process. Soft tissues and spinal canal: No prevertebral fluid or swelling. No visible canal hematoma. Disc levels: No significant central foraminal stenosis. 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 Upper chest: Negative. Other: None IMPRESSION: CT head: 1. No acute intracranial abnormality. 2. Chronic minimal small vessel ischemic disease. 3. Stable involutional changes the brain likely age related. CT cervical spine: No acute cervical spine fracture  or posttraumatic listhesis. No prevertebral soft tissue swelling. Electronically Signed   By: Ashley Royalty M.D.   On: 08/23/2017 22:13   Dg Hip Unilat With Pelvis 2-3 Views Right  Result Date: 08/23/2017 CLINICAL DATA:  Fall.  Right lateral hip pain. EXAM: DG HIP (WITH OR WITHOUT PELVIS) 2-3V RIGHT COMPARISON:  None. FINDINGS: And hearing projects within the right hemipelvis. This is probably in the vicinity of the rectosigmoid. There is mild spurring of both  acetabula. Mild axial loss of articular space in both hips. Vascular calcifications noted. No appreciable fracture or acute bony findings. IMPRESSION: 1. Mild degenerative arthropathy of both hips. No acute bony findings. 2. Once again a foreign body favoring an earring projects over the right hemipelvis. I suspect this could be in the sigmoid colon. A similar appearance was shown on 06/12/2017. Electronically Signed   By: Van Clines M.D.   On: 08/23/2017 23:41    Procedures Procedures (including critical care time)  Medications Ordered in ED Medications - No data to display   Initial Impression / Assessment and Plan / ED Course  I have reviewed the triage vital signs and the nursing notes.  Pertinent labs & imaging results that were available during my care of the patient were reviewed by me and considered in my medical decision making (see chart for details).   Patient presents to the emergency department status post mechanical falls with some right-sided head pain as well as back pain.  Patient nontoxic-appearing, in no apparent distress, vitals without significant abnormality.  No prodrome to raise concern for near syncope type mechanism.  Imaging ordered according to H&P.  CT head negative for acute intracranial abnormality, x-rays of the chest, lumbar spine, and the right hip/pelvis all negative for acute fracture or dislocation.  No evidence of serious head, neck, or back injury.  Patient is neurovascularly intact distal to all areas of discomfort.  She has no focal neurologic deficits.  She is able to walk short distance to use the commode which is baseline. She states she would like to go home and her husband who she lives with is comfortable with this- we discussed option of home health with PT/OT- they would like to contact PCP tomorrow AM for this and I feel it would be helpful given patient's difficulty with mobitliy which does not appear to have significant change today.    Regarding patient's URI type symptoms: Chest x-ray obtained and negative for infiltrate, no evidence of respiratory distress, afebrile, doubt pneumonia.  Afebrile, no sinus tenderness, doubt acute bacterial sinusitis.  Centor score 0, doubt strep pharyngitis.  No known tick exposures to raise concern for tickborne illness.  Suspect viral versus allergic at this time.  Recommended over-the-counter supportive treatments.  I discussed results, treatment plan, need for PCP follow-up, and return precautions with the patient and her husband at bedside. Provided opportunity for questions, patient  And her husband confirmed understanding and are in agreement with plan.    Final Clinical Impressions(s) / ED Diagnoses   Final diagnoses:  Fall, initial encounter    ED Discharge Orders    None       Amaryllis Dyke, PA-C 08/24/17 0117    Fredia Sorrow, MD 09/05/17 [id number]

## 2017-08-23 NOTE — ED Triage Notes (Signed)
Per EMS, pt coming from home with complaints of two falls today. After the second fall pt could not get off the floor. Pt has pain in right arm and right side of head, pt is on plavix. Pt a&o x4.

## 2017-08-24 NOTE — ED Provider Notes (Signed)
Medical screening examination/treatment/procedure(s) were conducted as a shared visit with non-physician practitioner(s) and myself.  I personally evaluated the patient during the encounter.  None   Results for orders placed or performed during the hospital encounter of 93/26/71  Basic metabolic panel  Result Value Ref Range   Sodium 139 135 - 145 mmol/L   Potassium 4.7 3.5 - 5.1 mmol/L   Chloride 102 98 - 111 mmol/L   CO2 28 22 - 32 mmol/L   Glucose, Bld 406 (H) 70 - 99 mg/dL   BUN 20 8 - 23 mg/dL   Creatinine, Ser 1.72 (H) 0.44 - 1.00 mg/dL   Calcium 9.7 8.9 - 10.3 mg/dL   GFR calc non Af Amer 27 (L) >60 mL/min   GFR calc Af Amer 32 (L) >60 mL/min   Anion gap 9 5 - 15  CBC  Result Value Ref Range   WBC 5.9 4.0 - 10.5 K/uL   RBC 3.52 (L) 3.87 - 5.11 MIL/uL   Hemoglobin 11.3 (L) 12.0 - 15.0 g/dL   HCT 35.8 (L) 36.0 - 46.0 %   MCV 101.7 (H) 78.0 - 100.0 fL   MCH 32.1 26.0 - 34.0 pg   MCHC 31.6 30.0 - 36.0 g/dL   RDW 12.7 11.5 - 15.5 %   Platelets 267 150 - 400 K/uL  Urinalysis, Routine w reflex microscopic  Result Value Ref Range   Color, Urine YELLOW YELLOW   APPearance CLEAR CLEAR   Specific Gravity, Urine 1.013 1.005 - 1.030   pH 5.0 5.0 - 8.0   Glucose, UA >=500 (A) NEGATIVE mg/dL   Hgb urine dipstick NEGATIVE NEGATIVE   Bilirubin Urine NEGATIVE NEGATIVE   Ketones, ur NEGATIVE NEGATIVE mg/dL   Protein, ur NEGATIVE NEGATIVE mg/dL   Nitrite NEGATIVE NEGATIVE   Leukocytes, UA NEGATIVE NEGATIVE   RBC / HPF 0-5 0 - 5 RBC/hpf   WBC, UA 0-5 0 - 5 WBC/hpf   Bacteria, UA MANY (A) NONE SEEN   Squamous Epithelial / LPF 0-5 0 - 5   Mucus PRESENT    Hyaline Casts, UA PRESENT   CBG monitoring, ED  Result Value Ref Range   Glucose-Capillary 244 (H) 70 - 99 mg/dL  I-stat troponin, ED  Result Value Ref Range   Troponin i, poc 0.00 0.00 - 0.08 ng/mL   Comment 3           Dg Chest 2 View  Result Date: 08/23/2017 CLINICAL DATA:  Fall at home.  Productive cough for 2 days.  EXAM: CHEST - 2 VIEW COMPARISON:  08/04/2017 FINDINGS: Dual lead pacer remains in place. Mild-to-moderate enlargement of the cardiopericardial silhouette, without edema. The lungs appear clear. Mild thoracic spondylosis.  No pleural effusion. IMPRESSION: 1. Mild-to-moderate enlargement of the cardiopericardial silhouette, without edema. 2. Dual lead pacer noted. Electronically Signed   By: Van Clines M.D.   On: 08/23/2017 23:41   Dg Ribs Unilateral W/chest Left  Result Date: 08/04/2017 CLINICAL DATA:  Post syncopal episode. EXAM: LEFT RIBS AND CHEST - 3+ VIEW COMPARISON:  06/12/2017 FINDINGS: Dual lead cardiac pacemaker in stable position. Left lower posterior rib pain. Mildly enlarged cardiac silhouette. Mediastinal contours appear intact. There is no evidence of focal airspace consolidation, pleural effusion or pneumothorax. Osseous structures are without acute abnormality. No rib fractures seen. Soft tissues are grossly normal. IMPRESSION: Mildly enlarged cardiac silhouette. No radiographic evidence of displaced rib fractures. Electronically Signed   By: Fidela Salisbury M.D.   On: 08/04/2017 20:29  Dg Lumbar Spine Complete  Result Date: 08/23/2017 CLINICAL DATA:  Fall at home yesterday.  Right lateral hip pain. EXAM: LUMBAR SPINE - COMPLETE 4+ VIEW COMPARISON:  07/05/2017 FINDINGS: Extensive gas throughout the bowel. Bony demineralization. Degenerative facet arthropathy bilaterally at L3-4, L4-5, and L5-S1. 2.5 mm of degenerative anterolisthesis at L4-5. No fracture or acute subluxation is identified. IMPRESSION: 1. Lower lumbar degenerative facet arthropathy. No acute lumbar spine findings. 2. Bony demineralization. Electronically Signed   By: Van Clines M.D.   On: 08/23/2017 23:36   Ct Head Wo Contrast  Result Date: 08/23/2017 CLINICAL DATA:  Complain of 2 falls today. Right arm pain and right-sided headache. EXAM: CT HEAD WITHOUT CONTRAST CT CERVICAL SPINE WITHOUT CONTRAST  TECHNIQUE: Multidetector CT imaging of the head and cervical spine was performed following the standard protocol without intravenous contrast. Multiplanar CT image reconstructions of the cervical spine were also generated. COMPARISON:  08/04/2017 FINDINGS: CT HEAD FINDINGS Brain: Stable involutional changes of the brain in keeping with the patient's age. Chronic minimal small vessel ischemic disease. No acute intracranial hemorrhage, midline shift or edema. No intra-axial mass nor extra-axial fluid collections. No hydrocephalus. Midline fourth ventricle and basal cisterns. Vascular: No hyperdense vessel sign. Skull: Intact Sinuses/Orbits: Intact orbits and globes.  Clear paranasal sinuses. Other: Clear mastoids. CT CERVICAL SPINE FINDINGS Alignment: Normal. Skull base and vertebrae: No acute fracture. No primary bone lesion or focal pathologic process. Soft tissues and spinal canal: No prevertebral fluid or swelling. No visible canal hematoma. Disc levels: No significant central foraminal stenosis. 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 Upper chest: Negative. Other: None IMPRESSION: CT head: 1. No acute intracranial abnormality. 2. Chronic minimal small vessel ischemic disease. 3. Stable involutional changes the brain likely age related. CT cervical spine: No acute cervical spine fracture or posttraumatic listhesis. No prevertebral soft tissue swelling. Electronically Signed   By: Ashley Royalty M.D.   On: 08/23/2017 22:13   Ct Head Wo Contrast  Result Date: 08/04/2017 CLINICAL DATA:  Status post syncopal episode. EXAM: CT HEAD WITHOUT CONTRAST CT CERVICAL SPINE WITHOUT CONTRAST TECHNIQUE: Multidetector CT imaging of the head and cervical spine was performed following the standard protocol without intravenous contrast. Multiplanar CT image reconstructions of the cervical spine were also generated. COMPARISON:  10/23/2016 FINDINGS: CT HEAD FINDINGS Brain: No evidence of acute infarction, hemorrhage,  hydrocephalus, extra-axial collection or mass lesion/mass effect. 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 Skull: Normal. Negative for fracture or focal lesion. Sinuses/Orbits: No acute finding. Other: None. CT CERVICAL SPINE FINDINGS Alignment: Straightening of the cervical lordosis. Skull base and vertebrae: No acute fracture. No primary bone lesion or focal pathologic process. Soft tissues and spinal canal: No prevertebral fluid or swelling. No visible canal hematoma. Disc levels:  C3-C4 spondylosis. Upper chest: None. Other: None. IMPRESSION: No acute intracranial abnormality. No evidence of acute traumatic injury to cervical spine. C3-C4 spondylosis. Electronically Signed   By: Fidela Salisbury M.D.   On: 08/04/2017 20:12   Ct Cervical Spine Wo Contrast  Result Date: 08/23/2017 CLINICAL DATA:  Complain of 2 falls today. Right arm pain and right-sided headache. EXAM: CT HEAD WITHOUT CONTRAST CT CERVICAL SPINE WITHOUT CONTRAST TECHNIQUE: Multidetector CT imaging of the head and cervical spine was performed following the standard protocol without intravenous contrast. Multiplanar CT image reconstructions of the cervical spine were also generated. COMPARISON:  08/04/2017 FINDINGS: CT HEAD FINDINGS Brain: Stable involutional changes of the brain in keeping with the patient's age. Chronic  minimal small vessel ischemic disease. No acute intracranial hemorrhage, midline shift or edema. No intra-axial mass nor extra-axial fluid collections. No hydrocephalus. Midline fourth ventricle and basal cisterns. Vascular: No hyperdense vessel sign. Skull: Intact Sinuses/Orbits: Intact orbits and globes.  Clear paranasal sinuses. Other: Clear mastoids. CT CERVICAL SPINE FINDINGS Alignment: Normal. Skull base and vertebrae: No acute fracture. No primary bone lesion or focal pathologic process. Soft tissues and spinal canal: No prevertebral fluid or swelling. No visible canal  hematoma. Disc levels: No significant central foraminal stenosis. 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 Upper chest: Negative. Other: None IMPRESSION: CT head: 1. No acute intracranial abnormality. 2. Chronic minimal small vessel ischemic disease. 3. Stable involutional changes the brain likely age related. CT cervical spine: No acute cervical spine fracture or posttraumatic listhesis. No prevertebral soft tissue swelling. Electronically Signed   By: Ashley Royalty M.D.   On: 08/23/2017 22:13   Ct Cervical Spine Wo Contrast  Result Date: 08/04/2017 CLINICAL DATA:  Status post syncopal episode. EXAM: CT HEAD WITHOUT CONTRAST CT CERVICAL SPINE WITHOUT CONTRAST TECHNIQUE: Multidetector CT imaging of the head and cervical spine was performed following the standard protocol without intravenous contrast. Multiplanar CT image reconstructions of the cervical spine were also generated. COMPARISON:  10/23/2016 FINDINGS: CT HEAD FINDINGS Brain: No evidence of acute infarction, hemorrhage, hydrocephalus, extra-axial collection or mass lesion/mass effect. 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 Skull: Normal. Negative for fracture or focal lesion. Sinuses/Orbits: No acute finding. Other: None. CT CERVICAL SPINE FINDINGS Alignment: Straightening of the cervical lordosis. Skull base and vertebrae: No acute fracture. No primary bone lesion or focal pathologic process. Soft tissues and spinal canal: No prevertebral fluid or swelling. No visible canal hematoma. Disc levels:  C3-C4 spondylosis. Upper chest: None. Other: None. IMPRESSION: No acute intracranial abnormality. No evidence of acute traumatic injury to cervical spine. C3-C4 spondylosis. Electronically Signed   By: Fidela Salisbury M.D.   On: 08/04/2017 20:12   Dg Hip Unilat With Pelvis 2-3 Views Right  Result Date: 08/23/2017 CLINICAL DATA:  Fall.  Right lateral hip pain. EXAM: DG HIP (WITH OR WITHOUT PELVIS) 2-3V  RIGHT COMPARISON:  None. FINDINGS: And hearing projects within the right hemipelvis. This is probably in the vicinity of the rectosigmoid. There is mild spurring of both acetabula. Mild axial loss of articular space in both hips. Vascular calcifications noted. No appreciable fracture or acute bony findings. IMPRESSION: 1. Mild degenerative arthropathy of both hips. No acute bony findings. 2. Once again a foreign body favoring an earring projects over the right hemipelvis. I suspect this could be in the sigmoid colon. A similar appearance was shown on 06/12/2017. Electronically Signed   By: Van Clines M.D.   On: 08/23/2017 23:41    Patient seen by me along with the physician assistant.  By EMS coming from home with complaints of 2 falls today.  Patient after the second fall could not get up off the floor.  Complaining of right arm pain right side of head pain and pain to the right hip and lumbar area.  Patient based on this had CT head neck without any acute findings.  X-rays of the lumbar area and x-rays of the hip and pelvis.  All these were negative.  Patient and family okay with going home.  They are going to contact her primary care doctor in the morning for additional home health assistance.  At baseline patient is not very ambulatory.  And is now  having frequent falls.  Patient not on blood thinners.   Fredia Sorrow, MD 08/24/17 [phone]

## 2017-08-24 NOTE — Discharge Instructions (Addendum)
You were seen in the emergency department today after multiple falls.  The x-rays we obtained did not show any fractures or dislocations.  Your head CT did not show any new problems today.  Regarding your cough and congestion, your chest x-ray did not show signs of pneumonia.   Please call Dr. Virgina Jock tomorrow morning to discuss the possibility of setting up home health.  Would also like you to see Dr. Virgina Jock in the office in the next 3 to 5 days for recheck of all of your symptoms.  Return to the ER anytime for new or worsening symptoms including but not limited to loss of consciousness, numbness, weakness, chest pain, fevers, additional falls, vomiting, or any other concerns that you may have.

## 2017-08-29 DIAGNOSIS — I130 Hypertensive heart and chronic kidney disease with heart failure and stage 1 through stage 4 chronic kidney disease, or unspecified chronic kidney disease: Secondary | ICD-10-CM | POA: Diagnosis not present

## 2017-08-29 DIAGNOSIS — I5042 Chronic combined systolic (congestive) and diastolic (congestive) heart failure: Secondary | ICD-10-CM | POA: Diagnosis not present

## 2017-08-29 DIAGNOSIS — E11319 Type 2 diabetes mellitus with unspecified diabetic retinopathy without macular edema: Secondary | ICD-10-CM | POA: Diagnosis not present

## 2017-08-29 DIAGNOSIS — M48061 Spinal stenosis, lumbar region without neurogenic claudication: Secondary | ICD-10-CM | POA: Diagnosis not present

## 2017-08-29 DIAGNOSIS — D631 Anemia in chronic kidney disease: Secondary | ICD-10-CM | POA: Diagnosis not present

## 2017-08-29 DIAGNOSIS — E1122 Type 2 diabetes mellitus with diabetic chronic kidney disease: Secondary | ICD-10-CM | POA: Diagnosis not present

## 2017-08-29 DIAGNOSIS — N183 Chronic kidney disease, stage 3 (moderate): Secondary | ICD-10-CM | POA: Diagnosis not present

## 2017-08-29 DIAGNOSIS — E1140 Type 2 diabetes mellitus with diabetic neuropathy, unspecified: Secondary | ICD-10-CM | POA: Diagnosis not present

## 2017-08-29 DIAGNOSIS — M1991 Primary osteoarthritis, unspecified site: Secondary | ICD-10-CM | POA: Diagnosis not present

## 2017-08-29 DIAGNOSIS — M47812 Spondylosis without myelopathy or radiculopathy, cervical region: Secondary | ICD-10-CM | POA: Diagnosis not present

## 2017-08-30 DIAGNOSIS — E118 Type 2 diabetes mellitus with unspecified complications: Secondary | ICD-10-CM | POA: Diagnosis not present

## 2017-08-30 DIAGNOSIS — Z794 Long term (current) use of insulin: Secondary | ICD-10-CM | POA: Diagnosis not present

## 2017-08-31 DIAGNOSIS — K5901 Slow transit constipation: Secondary | ICD-10-CM | POA: Diagnosis not present

## 2017-08-31 DIAGNOSIS — I130 Hypertensive heart and chronic kidney disease with heart failure and stage 1 through stage 4 chronic kidney disease, or unspecified chronic kidney disease: Secondary | ICD-10-CM | POA: Diagnosis not present

## 2017-08-31 DIAGNOSIS — M47812 Spondylosis without myelopathy or radiculopathy, cervical region: Secondary | ICD-10-CM | POA: Diagnosis not present

## 2017-08-31 DIAGNOSIS — D132 Benign neoplasm of duodenum: Secondary | ICD-10-CM | POA: Diagnosis not present

## 2017-08-31 DIAGNOSIS — M48061 Spinal stenosis, lumbar region without neurogenic claudication: Secondary | ICD-10-CM | POA: Diagnosis not present

## 2017-08-31 DIAGNOSIS — I5042 Chronic combined systolic (congestive) and diastolic (congestive) heart failure: Secondary | ICD-10-CM | POA: Diagnosis not present

## 2017-08-31 DIAGNOSIS — E1122 Type 2 diabetes mellitus with diabetic chronic kidney disease: Secondary | ICD-10-CM | POA: Diagnosis not present

## 2017-09-05 DIAGNOSIS — M48061 Spinal stenosis, lumbar region without neurogenic claudication: Secondary | ICD-10-CM | POA: Diagnosis not present

## 2017-09-05 DIAGNOSIS — M47812 Spondylosis without myelopathy or radiculopathy, cervical region: Secondary | ICD-10-CM | POA: Diagnosis not present

## 2017-09-05 DIAGNOSIS — E1122 Type 2 diabetes mellitus with diabetic chronic kidney disease: Secondary | ICD-10-CM | POA: Diagnosis not present

## 2017-09-05 DIAGNOSIS — I5042 Chronic combined systolic (congestive) and diastolic (congestive) heart failure: Secondary | ICD-10-CM | POA: Diagnosis not present

## 2017-09-05 DIAGNOSIS — I130 Hypertensive heart and chronic kidney disease with heart failure and stage 1 through stage 4 chronic kidney disease, or unspecified chronic kidney disease: Secondary | ICD-10-CM | POA: Diagnosis not present

## 2017-09-07 DIAGNOSIS — M48061 Spinal stenosis, lumbar region without neurogenic claudication: Secondary | ICD-10-CM | POA: Diagnosis not present

## 2017-09-07 DIAGNOSIS — I5042 Chronic combined systolic (congestive) and diastolic (congestive) heart failure: Secondary | ICD-10-CM | POA: Diagnosis not present

## 2017-09-07 DIAGNOSIS — I130 Hypertensive heart and chronic kidney disease with heart failure and stage 1 through stage 4 chronic kidney disease, or unspecified chronic kidney disease: Secondary | ICD-10-CM | POA: Diagnosis not present

## 2017-09-07 DIAGNOSIS — E1122 Type 2 diabetes mellitus with diabetic chronic kidney disease: Secondary | ICD-10-CM | POA: Diagnosis not present

## 2017-09-07 DIAGNOSIS — M47812 Spondylosis without myelopathy or radiculopathy, cervical region: Secondary | ICD-10-CM | POA: Diagnosis not present

## 2017-09-12 ENCOUNTER — Observation Stay (HOSPITAL_COMMUNITY)
Admission: EM | Admit: 2017-09-12 | Discharge: 2017-09-14 | Disposition: A | Discharge status: Home / Self Care | Payer: Medicare HMO | Attending: Internal Medicine | Admitting: Internal Medicine

## 2017-09-12 ENCOUNTER — Emergency Department (HOSPITAL_COMMUNITY): Payer: Medicare HMO

## 2017-09-12 ENCOUNTER — Encounter (HOSPITAL_COMMUNITY): Payer: Self-pay | Admitting: Emergency Medicine

## 2017-09-12 ENCOUNTER — Other Ambulatory Visit: Payer: Self-pay

## 2017-09-12 DIAGNOSIS — Z9981 Dependence on supplemental oxygen: Secondary | ICD-10-CM | POA: Insufficient documentation

## 2017-09-12 DIAGNOSIS — F329 Major depressive disorder, single episode, unspecified: Secondary | ICD-10-CM | POA: Insufficient documentation

## 2017-09-12 DIAGNOSIS — R001 Bradycardia, unspecified: Secondary | ICD-10-CM | POA: Insufficient documentation

## 2017-09-12 DIAGNOSIS — Z9889 Other specified postprocedural states: Secondary | ICD-10-CM | POA: Insufficient documentation

## 2017-09-12 DIAGNOSIS — Z7901 Long term (current) use of anticoagulants: Secondary | ICD-10-CM | POA: Diagnosis not present

## 2017-09-12 DIAGNOSIS — I2510 Atherosclerotic heart disease of native coronary artery without angina pectoris: Secondary | ICD-10-CM | POA: Diagnosis not present

## 2017-09-12 DIAGNOSIS — Z79899 Other long term (current) drug therapy: Secondary | ICD-10-CM | POA: Insufficient documentation

## 2017-09-12 DIAGNOSIS — R69 Illness, unspecified: Secondary | ICD-10-CM | POA: Diagnosis not present

## 2017-09-12 DIAGNOSIS — I130 Hypertensive heart and chronic kidney disease with heart failure and stage 1 through stage 4 chronic kidney disease, or unspecified chronic kidney disease: Secondary | ICD-10-CM | POA: Diagnosis not present

## 2017-09-12 DIAGNOSIS — I4581 Long QT syndrome: Secondary | ICD-10-CM | POA: Insufficient documentation

## 2017-09-12 DIAGNOSIS — D649 Anemia, unspecified: Secondary | ICD-10-CM | POA: Insufficient documentation

## 2017-09-12 DIAGNOSIS — Z888 Allergy status to other drugs, medicaments and biological substances status: Secondary | ICD-10-CM | POA: Diagnosis not present

## 2017-09-12 DIAGNOSIS — I519 Heart disease, unspecified: Secondary | ICD-10-CM | POA: Diagnosis present

## 2017-09-12 DIAGNOSIS — Z86718 Personal history of other venous thrombosis and embolism: Secondary | ICD-10-CM | POA: Insufficient documentation

## 2017-09-12 DIAGNOSIS — E1129 Type 2 diabetes mellitus with other diabetic kidney complication: Secondary | ICD-10-CM | POA: Diagnosis present

## 2017-09-12 DIAGNOSIS — Z9842 Cataract extraction status, left eye: Secondary | ICD-10-CM | POA: Insufficient documentation

## 2017-09-12 DIAGNOSIS — I959 Hypotension, unspecified: Secondary | ICD-10-CM | POA: Diagnosis not present

## 2017-09-12 DIAGNOSIS — E739 Lactose intolerance, unspecified: Secondary | ICD-10-CM | POA: Insufficient documentation

## 2017-09-12 DIAGNOSIS — I10 Essential (primary) hypertension: Secondary | ICD-10-CM | POA: Diagnosis present

## 2017-09-12 DIAGNOSIS — N184 Chronic kidney disease, stage 4 (severe): Secondary | ICD-10-CM | POA: Insufficient documentation

## 2017-09-12 DIAGNOSIS — Z96652 Presence of left artificial knee joint: Secondary | ICD-10-CM | POA: Insufficient documentation

## 2017-09-12 DIAGNOSIS — Z90710 Acquired absence of both cervix and uterus: Secondary | ICD-10-CM | POA: Insufficient documentation

## 2017-09-12 DIAGNOSIS — M810 Age-related osteoporosis without current pathological fracture: Secondary | ICD-10-CM | POA: Diagnosis not present

## 2017-09-12 DIAGNOSIS — Z955 Presence of coronary angioplasty implant and graft: Secondary | ICD-10-CM | POA: Insufficient documentation

## 2017-09-12 DIAGNOSIS — F419 Anxiety disorder, unspecified: Secondary | ICD-10-CM | POA: Insufficient documentation

## 2017-09-12 DIAGNOSIS — J45909 Unspecified asthma, uncomplicated: Secondary | ICD-10-CM | POA: Insufficient documentation

## 2017-09-12 DIAGNOSIS — Z882 Allergy status to sulfonamides status: Secondary | ICD-10-CM | POA: Diagnosis not present

## 2017-09-12 DIAGNOSIS — G4730 Sleep apnea, unspecified: Secondary | ICD-10-CM | POA: Diagnosis not present

## 2017-09-12 DIAGNOSIS — M47812 Spondylosis without myelopathy or radiculopathy, cervical region: Secondary | ICD-10-CM | POA: Diagnosis not present

## 2017-09-12 DIAGNOSIS — Z7902 Long term (current) use of antithrombotics/antiplatelets: Secondary | ICD-10-CM | POA: Insufficient documentation

## 2017-09-12 DIAGNOSIS — E860 Dehydration: Secondary | ICD-10-CM | POA: Diagnosis not present

## 2017-09-12 DIAGNOSIS — H409 Unspecified glaucoma: Secondary | ICD-10-CM | POA: Diagnosis not present

## 2017-09-12 DIAGNOSIS — G2581 Restless legs syndrome: Secondary | ICD-10-CM | POA: Diagnosis not present

## 2017-09-12 DIAGNOSIS — E039 Hypothyroidism, unspecified: Secondary | ICD-10-CM | POA: Diagnosis not present

## 2017-09-12 DIAGNOSIS — I951 Orthostatic hypotension: Secondary | ICD-10-CM | POA: Diagnosis not present

## 2017-09-12 DIAGNOSIS — M47814 Spondylosis without myelopathy or radiculopathy, thoracic region: Secondary | ICD-10-CM | POA: Diagnosis not present

## 2017-09-12 DIAGNOSIS — E1165 Type 2 diabetes mellitus with hyperglycemia: Secondary | ICD-10-CM | POA: Diagnosis not present

## 2017-09-12 DIAGNOSIS — E1122 Type 2 diabetes mellitus with diabetic chronic kidney disease: Secondary | ICD-10-CM | POA: Insufficient documentation

## 2017-09-12 DIAGNOSIS — E785 Hyperlipidemia, unspecified: Secondary | ICD-10-CM | POA: Diagnosis not present

## 2017-09-12 DIAGNOSIS — R079 Chest pain, unspecified: Secondary | ICD-10-CM

## 2017-09-12 DIAGNOSIS — Z87442 Personal history of urinary calculi: Secondary | ICD-10-CM | POA: Insufficient documentation

## 2017-09-12 DIAGNOSIS — R0602 Shortness of breath: Secondary | ICD-10-CM | POA: Diagnosis not present

## 2017-09-12 DIAGNOSIS — M48061 Spinal stenosis, lumbar region without neurogenic claudication: Secondary | ICD-10-CM | POA: Diagnosis not present

## 2017-09-12 DIAGNOSIS — N1832 Chronic kidney disease, stage 3b: Secondary | ICD-10-CM | POA: Diagnosis present

## 2017-09-12 DIAGNOSIS — Z79890 Hormone replacement therapy (postmenopausal): Secondary | ICD-10-CM | POA: Insufficient documentation

## 2017-09-12 DIAGNOSIS — Z9841 Cataract extraction status, right eye: Secondary | ICD-10-CM | POA: Insufficient documentation

## 2017-09-12 DIAGNOSIS — R42 Dizziness and giddiness: Secondary | ICD-10-CM | POA: Diagnosis not present

## 2017-09-12 DIAGNOSIS — R05 Cough: Secondary | ICD-10-CM | POA: Diagnosis not present

## 2017-09-12 DIAGNOSIS — Z950 Presence of cardiac pacemaker: Secondary | ICD-10-CM | POA: Insufficient documentation

## 2017-09-12 DIAGNOSIS — N183 Chronic kidney disease, stage 3 (moderate): Secondary | ICD-10-CM

## 2017-09-12 DIAGNOSIS — Z8673 Personal history of transient ischemic attack (TIA), and cerebral infarction without residual deficits: Secondary | ICD-10-CM | POA: Insufficient documentation

## 2017-09-12 DIAGNOSIS — Z86711 Personal history of pulmonary embolism: Secondary | ICD-10-CM | POA: Insufficient documentation

## 2017-09-12 DIAGNOSIS — E1140 Type 2 diabetes mellitus with diabetic neuropathy, unspecified: Secondary | ICD-10-CM | POA: Insufficient documentation

## 2017-09-12 DIAGNOSIS — Z9049 Acquired absence of other specified parts of digestive tract: Secondary | ICD-10-CM | POA: Insufficient documentation

## 2017-09-12 DIAGNOSIS — Z794 Long term (current) use of insulin: Secondary | ICD-10-CM | POA: Insufficient documentation

## 2017-09-12 DIAGNOSIS — I5042 Chronic combined systolic (congestive) and diastolic (congestive) heart failure: Secondary | ICD-10-CM | POA: Diagnosis not present

## 2017-09-12 LAB — I-STAT TROPONIN, ED
Troponin i, poc: 0 ng/mL (ref 0.00–0.08)
Troponin i, poc: 0 ng/mL (ref 0.00–0.08)

## 2017-09-12 LAB — CBC WITH DIFFERENTIAL/PLATELET
Abs Immature Granulocytes: 0 10*3/uL (ref 0.0–0.1)
Basophils Absolute: 0.1 10*3/uL (ref 0.0–0.1)
Basophils Relative: 1 %
Eosinophils Absolute: 0.1 10*3/uL (ref 0.0–0.7)
Eosinophils Relative: 1 %
HCT: 27.8 % — ABNORMAL LOW (ref 36.0–46.0)
Hemoglobin: 8.9 g/dL — ABNORMAL LOW (ref 12.0–15.0)
Immature Granulocytes: 0 %
Lymphocytes Relative: 24 %
Lymphs Abs: 2.2 10*3/uL (ref 0.7–4.0)
MCH: 32.1 pg (ref 26.0–34.0)
MCHC: 32 g/dL (ref 30.0–36.0)
MCV: 100.4 fL — ABNORMAL HIGH (ref 78.0–100.0)
Monocytes Absolute: 0.5 10*3/uL (ref 0.1–1.0)
Monocytes Relative: 6 %
Neutro Abs: 6.3 10*3/uL (ref 1.7–7.7)
Neutrophils Relative %: 68 %
Platelets: 290 10*3/uL (ref 150–400)
RBC: 2.77 MIL/uL — ABNORMAL LOW (ref 3.87–5.11)
RDW: 11.8 % (ref 11.5–15.5)
WBC: 9.2 10*3/uL (ref 4.0–10.5)

## 2017-09-12 LAB — BRAIN NATRIURETIC PEPTIDE: B Natriuretic Peptide: 14.3 pg/mL (ref 0.0–100.0)

## 2017-09-12 LAB — COMPREHENSIVE METABOLIC PANEL
ALT: 12 U/L (ref 0–44)
AST: 22 U/L (ref 15–41)
Albumin: 3.4 g/dL — ABNORMAL LOW (ref 3.5–5.0)
Alkaline Phosphatase: 66 U/L (ref 38–126)
Anion gap: 10 (ref 5–15)
BUN: 23 mg/dL (ref 8–23)
CO2: 28 mmol/L (ref 22–32)
Calcium: 9.2 mg/dL (ref 8.9–10.3)
Chloride: 98 mmol/L (ref 98–111)
Creatinine, Ser: 1.88 mg/dL — ABNORMAL HIGH (ref 0.44–1.00)
GFR calc Af Amer: 28 mL/min — ABNORMAL LOW (ref >60)
GFR calc non Af Amer: 24 mL/min — ABNORMAL LOW (ref >60)
Glucose, Bld: 237 mg/dL — ABNORMAL HIGH (ref 70–99)
Potassium: 4.3 mmol/L (ref 3.5–5.1)
Sodium: 136 mmol/L (ref 135–145)
Total Bilirubin: 0.6 mg/dL (ref 0.3–1.2)
Total Protein: 6.4 g/dL — ABNORMAL LOW (ref 6.5–8.1)

## 2017-09-12 LAB — URINALYSIS, ROUTINE W REFLEX MICROSCOPIC
Bilirubin Urine: NEGATIVE
Glucose, UA: NEGATIVE mg/dL
Hgb urine dipstick: NEGATIVE
Ketones, ur: NEGATIVE mg/dL
Leukocytes, UA: NEGATIVE
Nitrite: NEGATIVE
Protein, ur: NEGATIVE mg/dL
Specific Gravity, Urine: 1.011 (ref 1.005–1.030)
pH: 5 (ref 5.0–8.0)

## 2017-09-12 LAB — POC OCCULT BLOOD, ED: Fecal Occult Bld: NEGATIVE

## 2017-09-12 LAB — GLUCOSE, CAPILLARY: Glucose-Capillary: 196 mg/dL — ABNORMAL HIGH (ref 70–99)

## 2017-09-12 LAB — I-STAT CG4 LACTIC ACID, ED
Lactic Acid, Venous: 1.98 mmol/L — ABNORMAL HIGH (ref 0.5–1.9)
Lactic Acid, Venous: 1.32 mmol/L (ref 0.5–1.9)

## 2017-09-12 LAB — TSH: TSH: 4.194 u[IU]/mL (ref 0.350–4.500)

## 2017-09-12 LAB — MAGNESIUM: Magnesium: 1.8 mg/dL (ref 1.7–2.4)

## 2017-09-12 MED ORDER — IPRATROPIUM BROMIDE 0.03 % NA SOLN
2.0000 | Freq: Two times a day (BID) | NASAL | Status: DC
  Filled 2017-09-12: qty 30

## 2017-09-12 MED ORDER — CLOPIDOGREL BISULFATE 75 MG PO TABS
75.0000 mg | ORAL_TABLET | Freq: Every morning | ORAL | Status: DC
  Administered 2017-09-13 – 2017-09-14 (×2): 75 mg via ORAL
  Filled 2017-09-12 (×2): qty 1

## 2017-09-12 MED ORDER — LEVOTHYROXINE SODIUM 100 MCG PO TABS
100.0000 ug | ORAL_TABLET | Freq: Every day | ORAL | Status: DC
  Administered 2017-09-13 – 2017-09-14 (×2): 100 ug via ORAL
  Filled 2017-09-12 (×2): qty 1

## 2017-09-12 MED ORDER — ACETAMINOPHEN 650 MG RE SUPP: 650.0000 mg | Freq: Four times a day (QID) | RECTAL | Status: DC | PRN

## 2017-09-12 MED ORDER — POLYETHYLENE GLYCOL 3350 17 G PO PACK: 17.0000 g | PACK | Freq: Every day | ORAL | Status: DC | PRN

## 2017-09-12 MED ORDER — ONDANSETRON HCL 4 MG PO TABS: 4.0000 mg | ORAL_TABLET | Freq: Four times a day (QID) | ORAL | Status: DC | PRN

## 2017-09-12 MED ORDER — SODIUM CHLORIDE 0.9 % IV BOLUS
500.0000 mL | Freq: Once | INTRAVENOUS | Status: AC
  Administered 2017-09-12: 500 mL via INTRAVENOUS

## 2017-09-12 MED ORDER — INSULIN ASPART 100 UNIT/ML ~~LOC~~ SOLN: 0.0000 [IU] | Freq: Three times a day (TID) | SUBCUTANEOUS | Status: DC

## 2017-09-12 MED ORDER — LINACLOTIDE 145 MCG PO CAPS
290.0000 ug | ORAL_CAPSULE | Freq: Every day | ORAL | Status: DC
  Administered 2017-09-13 – 2017-09-14 (×2): 290 ug via ORAL
  Filled 2017-09-12 (×2): qty 2

## 2017-09-12 MED ORDER — ACETAMINOPHEN 325 MG PO TABS: 650.0000 mg | ORAL_TABLET | Freq: Four times a day (QID) | ORAL | Status: DC | PRN

## 2017-09-12 MED ORDER — ONDANSETRON HCL 4 MG/2ML IJ SOLN
4.0000 mg | Freq: Four times a day (QID) | INTRAMUSCULAR | Status: DC | PRN
Start: 2017-09-12 — End: 2017-09-14

## 2017-09-12 MED ORDER — INSULIN ASPART 100 UNIT/ML ~~LOC~~ SOLN
0.0000 [IU] | Freq: Three times a day (TID) | SUBCUTANEOUS | Status: DC
  Administered 2017-09-13: 2 [IU] via SUBCUTANEOUS
  Administered 2017-09-13: 3 [IU] via SUBCUTANEOUS
  Administered 2017-09-13: 1 [IU] via SUBCUTANEOUS
  Administered 2017-09-14: 5 [IU] via SUBCUTANEOUS
  Administered 2017-09-14: 3 [IU] via SUBCUTANEOUS

## 2017-09-12 MED ORDER — ROSUVASTATIN CALCIUM 20 MG PO TABS
20.0000 mg | ORAL_TABLET | Freq: Every day | ORAL | Status: DC
  Administered 2017-09-13 – 2017-09-14 (×2): 20 mg via ORAL
  Filled 2017-09-12 (×2): qty 1

## 2017-09-12 MED ORDER — IPRATROPIUM BROMIDE 0.06 % NA SOLN
2.0000 | Freq: Two times a day (BID) | NASAL | Status: DC
  Administered 2017-09-13: 2 via NASAL
  Filled 2017-09-12: qty 15

## 2017-09-12 MED ORDER — CLONAZEPAM 0.5 MG PO TABS
0.5000 mg | ORAL_TABLET | Freq: Two times a day (BID) | ORAL | Status: DC
  Administered 2017-09-12 – 2017-09-14 (×4): 0.5 mg via ORAL
  Filled 2017-09-12 (×4): qty 1

## 2017-09-12 MED ORDER — POTASSIUM CHLORIDE CRYS ER 20 MEQ PO TBCR
20.0000 meq | EXTENDED_RELEASE_TABLET | Freq: Two times a day (BID) | ORAL | Status: DC
  Administered 2017-09-12 – 2017-09-14 (×4): 20 meq via ORAL
  Filled 2017-09-12 (×4): qty 1

## 2017-09-12 MED ORDER — ACETAMINOPHEN 325 MG PO TABS
650.0000 mg | ORAL_TABLET | Freq: Once | ORAL | Status: AC
  Administered 2017-09-12: 650 mg via ORAL
  Filled 2017-09-12: qty 2

## 2017-09-12 MED ORDER — BUSPIRONE HCL 10 MG PO TABS
10.0000 mg | ORAL_TABLET | Freq: Every morning | ORAL | Status: DC
Start: 2017-09-13 — End: 2017-09-14
  Administered 2017-09-13 – 2017-09-14 (×2): 10 mg via ORAL
  Filled 2017-09-12 (×2): qty 1

## 2017-09-12 MED ORDER — ALBUTEROL SULFATE (2.5 MG/3ML) 0.083% IN NEBU: 2.5000 mg | INHALATION_SOLUTION | Freq: Four times a day (QID) | RESPIRATORY_TRACT | Status: DC | PRN

## 2017-09-12 MED ORDER — GLIPIZIDE 10 MG PO TABS
20.0000 mg | ORAL_TABLET | Freq: Two times a day (BID) | ORAL | Status: DC
  Administered 2017-09-13 – 2017-09-14 (×3): 20 mg via ORAL
  Filled 2017-09-12 (×4): qty 2

## 2017-09-12 MED ORDER — SODIUM CHLORIDE 0.9 % IV SOLN
INTRAVENOUS | Status: AC
  Administered 2017-09-12: 23:00:00 via INTRAVENOUS

## 2017-09-12 MED ORDER — INSULIN NPH (HUMAN) (ISOPHANE) 100 UNIT/ML ~~LOC~~ SUSP
15.0000 [IU] | Freq: Two times a day (BID) | SUBCUTANEOUS | Status: DC
  Administered 2017-09-12: 15 [IU] via SUBCUTANEOUS
  Filled 2017-09-12: qty 10

## 2017-09-12 NOTE — ED Notes (Signed)
EDP notified of patients chest pain. EKG captured.

## 2017-09-12 NOTE — ED Provider Notes (Signed)
[facility] EMERGENCY DEPARTMENT Provider Note   CSN: [id number] Arrival date & time: 09/12/17  1052     History   Chief Complaint Chief Complaint  Patient presents with  . Hypotension    HPI Tara Jackson is a 77 y.o. female.  The history is provided by the patient, medical records and the spouse. No language interpreter was used.  Near Syncope  This is a new problem. The current episode started 1 to 2 hours ago. The problem occurs constantly. The problem has not changed since onset.Associated symptoms include chest pain. Pertinent negatives include no abdominal pain, no headaches and no shortness of breath. Nothing aggravates the symptoms. Nothing relieves the symptoms. She has tried nothing for the symptoms. The treatment provided no relief.    Past Medical History:  Diagnosis Date  . Anemia   . Anxiety   . Arthritis   . Asthma   . Brittle bone disease   . Cataract   . CKD (chronic kidney disease)   . Complication of anesthesia    hard to wake up after anesthesia, trouble turning head  . Coronary artery disease, non-occlusive 2014   a. minimal CAD by cath in 2014 with 40% RI stenosis. b. low-risk NST in 11/2014.  Marland Kitchen Depression   . Diabetes mellitus type 2, insulin dependent ([place])   . Diabetic neuropathy ([place])   . Diastolic dysfunction, left ventricle 11/30/14   EF 55%, grade 1 DD  . DVT (deep venous thrombosis) ([place])    "18 in RLE; 7 LLE prior to PE" (03/24/2015)  . Family history of adverse reaction to anesthesia    " the whole family is hard to wake up"  . Glaucoma   . H/O hiatal hernia   . Hyperlipidemia   . Hypertension associated with diabetes ([place])   . Hypothyroidism   . Kidney stones    "passed them"  . Memory loss 06/03/2014  . On home oxygen therapy    "2L oxygen concentrator @ night"  . Osteoporosis   . Oxygen desaturation during sleep    wears 2 liters of oxygen at night   . Palpitations 35years ago   Cardionet monitor -  revealed mostly normal sinus rhythm, sinus bradycardia with first-degree A-V block heart rates mostly in the 50s and 60s with some 70s. No arrhythmias, PVCs or PACs noted.  . 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 Pneumonia   . Presence of permanent cardiac pacemaker   . Restless legs syndrome   . Sleep apnea    cpap disontinued; test done 07/14/2008 ordered per  [place]  . Spinal headache   . Spinal stenosis    L 3, L 4 and L 5, and C 1, C 2 and C3  . Stroke [facility]) 04-19-2013   tia and stroke. Weakness Rt hand  . 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 s/p [employer] PPM in 03/2015 by Dr. Lovena Le secondary to sinus node dysfunction.   Marland Kitchen TIA (transient ischemic attack) March 2014    Patient Active Problem List   Diagnosis Date Noted  . Orthostatic dizziness 06/12/2017  . Diabetic neuropathy ([place]) 12/13/2016  . 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 Acute renal failure superimposed on chronic kidney disease ([name]) 10/24/2016  . Syncope 10/23/2016  . Chronic anemia 10/11/2016  . Pacemaker 04/26/2015  . Chronic daily headache 04/07/2015  .  Symptomatic bradycardia 03/24/2015  . Sinus node dysfunction ([place]) 03/24/2015  . Chronic diastolic heart failure ([place])   . Diabetes mellitus type 2 in obese ([place])   . Normal coronary arteries 01/14/2015  . Normal cardiac stress test 11/30/2014  . Bradycardia 11/28/2014  . Type 2 diabetes mellitus with renal manifestations ([place]) 11/28/2014  . Morbid obesity ([place]) 11/28/2014  . CKD stage 3 due to type 2 diabetes mellitus ([place]) 11/28/2014  . Memory loss 06/03/2014  . AKI (acute kidney injury) ([place])   . Chest pain 04/19/2014  . Pleuritic chest pain 04/19/2014  . Diabetes mellitus ([place]) 04/19/2014  . Edema of both legs 04/19/2014  . Dyspnea   . SOB (shortness of breath)   . Tremor of right hand 09/20/2013  . Headache(784.0) 09/19/2013   . Restless legs syndrome (RLS) 09/19/2013  . Constipation due to pain medication 08/21/2013  . Ankle edema 08/21/2013  . PVC's (premature ventricular contractions) 02/10/2013  . Diastolic dysfunction, left ventricle   . 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 PULMONARY EMBOLISM, HX OF 10/02/2009  . ALLERGIC RHINITIS 04/07/2009  . Sleep apnea 07/09/2008  . Hyperlipidemia with target LDL less than 130 05/20/2008  . Essential hypertension 05/20/2008    Past Surgical History:  Procedure Laterality Date  . ABDOMINAL HYSTERECTOMY    . APPENDECTOMY    . BACK SURGERY     steroid inj  . CARDIAC CATHETERIZATION  02/14/2012   no evidence of obstructive coronary disease ,low EDP with normal EF  . CATARACT EXTRACTION W/ INTRAOCULAR LENS  IMPLANT, BILATERAL Bilateral 2000s  . CHOLECYSTECTOMY    . DOPPLER ECHOCARDIOGRAPHY  2014; October 2016   a. EF 41-28%; normal diastolic pressures, no regional wall motion motion abnormalities;; b/ F 55-60%. Normal wall motion. GR 1 DD. No valve lesions  . EP IMPLANTABLE DEVICE N/A 03/24/2015   [facility] Scientific PPM, Dr. Lovena Le  . ESOPHAGOGASTRODUODENOSCOPY  08/09/2011   Procedure: ESOPHAGOGASTRODUODENOSCOPY (EGD);  Surgeon: Jeryl Columbia, MD;  Location: [facility] ENDOS[facility];  Service: Endoscopy;  Laterality: N/A;  . ESOPHAGOGASTRODUODENOSCOPY (EGD) WITH PROPOFOL N/A 11/12/2013   Procedure: ESOPHAGOGASTRODUODENOSCOPY (EGD) WITH PROPOFOL;  Surgeon: Jeryl Columbia, MD;  Location: [facility] ENDOSCOPY;  Service: Endoscopy;  Laterality: N/A;  . ESOPHAGOGASTRODUODENOSCOPY (EGD) WITH PROPOFOL N/A 05/05/2016   Procedure: ESOPHAGOGASTRODUODENOSCOPY (EGD) WITH PROPOFOL;  Surgeon: Clarene Essex, MD;  Location: [facility] ENDOSCOPY;  Service: Endoscopy;  Laterality: N/A;  . GLAUCOMA SURGERY Bilateral 2000s   "laser"  . HOT HEMOSTASIS  08/09/2011   Procedure: HOT HEMOSTASIS (ARGON PLASMA COAGULATION/BICAP);  Surgeon: Jeryl Columbia, MD;  Location: [facility];  Service: Endoscopy;  Laterality: N/A;  . HOT  HEMOSTASIS N/A 11/12/2013   Procedure: HOT HEMOSTASIS (ARGON PLASMA COAGULATION/BICAP);  Surgeon: Jeryl Columbia, MD;  Location: [facility];  Service: Endoscopy;  Laterality: N/A;  . INSERT / REPLACE / REMOVE PACEMAKER  03/24/2015  . JOINT REPLACEMENT    . KNEE SURGERY Left done with 2nd left knee replacement   spacer bar placement  . LEFT HEART CATHETERIZATION WITH CORONARY ANGIOGRAM N/A 02/14/2012   Procedure: LEFT HEART CATHETERIZATION WITH CORONARY ANGIOGRAM;  Surgeon: Leonie Man, MD;  Location: [facility] CATH LAB;  Service: Cardiovascular;  Laterality: N/A;  . LEV DOPPLER  05/29/2012   RIGHT EXTREM. NORMAL VENOUS DUPLEX  . NM MYOCAR PERF WALL MOTION  01/26/2012; 11/2014   a. EF 79%,LV normal,ST SEGMENT CHANGE SUGGESTIVE OF ISCHEMIA.; LOW RISK, EF 60%. No ischemia or infarction. No wall motion abnormality   . PACEMAKER PLACEMENT    .  REVISION TOTAL KNEE ARTHROPLASTY Left   . TONSILLECTOMY    . TOTAL KNEE ARTHROPLASTY Bilateral   . TRANSTHORACIC ECHOCARDIOGRAM  10/2016   EF 65-70%.  Vigorous LV function.  Mild LVH.  Poor acoustic windows.  Cannot assess diastolic function or significant valvular dysfunction.  Marland Kitchen TRIGGER FINGER RELEASE  11/22/2011   Procedure: RELEASE TRIGGER FINGER/A-1 PULLEY;  Surgeon: Meredith Pel, MD;  Location: [facility];  Service: Orthopedics;  Laterality: Left;  Left trigger thumb release  . TRIGGER FINGER RELEASE Right yrs ago     OB History   None      Home Medications    Prior to Admission medications   Medication Sig Start Date End Date Taking? Authorizing Provider  acetaminophen (TYLENOL) 500 MG tablet Take 2 tablets (1,000 mg total) by mouth every 6 (six) hours as needed. 06/12/17  Yes Charlesetta Shanks, MD  albuterol (PROVENTIL HFA;VENTOLIN HFA) 108 (90 BASE) MCG/ACT inhaler Inhale 2 puffs into the lungs every 6 (six) hours as needed for wheezing.   Yes [provider]  busPIRone (BUSPAR) 10 MG tablet Take 1 tablet (10 mg total) by mouth every  morning. 06/03/14  Yes Garvin Fila, MD  Cholecalciferol (VITAMIN D-3 PO) Take 2 tablets by mouth every morning.    Yes [provider]  clonazePAM (KLONOPIN) 0.5 MG tablet Take 0.5-1 mg by mouth See admin instructions. Takes 2 tabs in am and 1 tab in pm.   Yes [provider]  clopidogrel (PLAVIX) 75 MG tablet Take 75 mg by mouth every morning.   Yes [provider]  furosemide (LASIX) 20 MG tablet Take 1 tablet (20 mg total) by mouth daily. 06/12/17 09/12/17 Yes Leonie Man, MD  glipiZIDE (GLUCOTROL) 10 MG tablet Take 20 mg by mouth 2 (two) times daily before a meal.  05/03/14  Yes [provider]  ipratropium (ATROVENT) 0.03 % nasal spray Place 2 sprays into both nostrils every 12 (twelve) hours. Patient taking differently: Place 2 sprays into both nostrils every 12 (twelve) hours as needed for rhinitis.  10/14/16  Yes Patrecia Pour, MD  nortriptyline (PAMELOR) 50 MG capsule TAKE 2 CAPSULES (100MG ) AT BEDTIME Patient taking differently: TAKE 1 CAPSULES (100MG ) AT two times daily 06/02/16  Yes Garvin Fila, MD  potassium chloride SA (K-DUR,KLOR-CON) 20 MEQ tablet Take 1 tablet (20 mEq total) by mouth 2 (two) times daily. 06/22/15  Yes Davonna Belling, MD  traMADol (ULTRAM) 50 MG tablet Take 1 tablet (50 mg total) by mouth every 12 (twelve) hours as needed for moderate pain. Patient taking differently: Take 50 mg by mouth 2 (two) times daily as needed for moderate pain.  03/27/15  Yes Bhagat, Bhavinkumar, PA  insulin NPH (HUMULIN N,NOVOLIN N) 100 UNIT/ML injection Inject 0-20 Units into the skin See admin instructions. Novolin-N Give 20 unit in morning; give  5 units at 4pm; evening dose as needed.  If less than 150 blood sugar= 0units.  If greater than 200 blood sugar= give 10units to decrease by 100 blood sugar    [provider]  insulin regular (NOVOLIN R,HUMULIN R) 100 units/mL injection Inject 0-25 Units into the skin See admin instructions. Give  25 unit in morning; give 20units at 4pm; evening dose as needed.  If less than 150 blood sugar= 0units.  If greater than 200 blood sugar= give 10units to decrease by 100 blood sugar    [provider]  irbesartan (AVAPRO) 150 MG tablet Take 150 mg by mouth  daily. 06/22/17   [provider]  irbesartan (AVAPRO) 75 MG tablet Take 1 tablet (75 mg total) by mouth daily. 06/12/17   Leonie Man, MD  levothyroxine (SYNTHROID, LEVOTHROID) 100 MCG tablet Take 100 mcg by mouth every morning.     [provider]  lidocaine (LIDODERM) 5 % Place 3 patches onto the skin daily as needed (pain). Remove & Discard patch within 12 hours or as directed by MD    [provider]  linaclotide (LINZESS) 290 MCG CAPS capsule Take 290 mcg by mouth daily before breakfast.    [provider]  LORazepam (ATIVAN) 0.5 MG tablet Take 0.5 mg by mouth daily as needed for anxiety.    [provider]  magnesium 30 MG tablet Take 30 mg by mouth daily.    [provider]  OXYGEN Inhale into the lungs. Used at overnight with a Cytogeneticist, Historical, MD  polyethylene glycol (MIRALAX / GLYCOLAX) packet Take 17 g by mouth every other day.     [provider]  rosuvastatin (CRESTOR) 20 MG tablet  03/13/17   [provider]  solifenacin (VESICARE) 10 MG tablet Take 10 mg by mouth daily as needed (overactive bladder).     [provider]  terconazole (TERAZOL 7) 0.4 % vaginal cream Place 1 applicator vaginally at bedtime as needed (for yeast infection).  04/18/14   [provider]  traMADol (ULTRAM) 50 MG tablet 1 to 2 tablets every 6 hours as needed for pain.  You may take this with acetaminophen. Patient not taking: Reported on 09/12/2017 06/12/17   Charlesetta Shanks, MD    Family History Family History  Problem Relation Age of Onset  . Cancer Mother   . Cancer - Prostate Father     Social History Social History   Tobacco Use    . Smoking status: Never Smoker  . Smokeless tobacco: Never Used  Substance Use Topics  . Alcohol use: No    Alcohol/week: 0.0 oz  . Drug use: No     Allergies   Iodine; Iohexol; Metoclopramide hcl; Sulfa antibiotics; Lactose intolerance (gi); and Lansoprazole   Review of Systems Review of Systems  Constitutional: Positive for chills and fatigue. Negative for diaphoresis and fever.  HENT: Negative for congestion.   Eyes: Negative for visual disturbance.  Respiratory: Negative for cough, chest tightness, shortness of breath and wheezing.   Cardiovascular: Positive for chest pain, leg swelling (bilateral) and near-syncope. Negative for palpitations.  Gastrointestinal: Negative for abdominal pain, constipation, diarrhea, nausea and vomiting.  Genitourinary: Positive for dysuria and frequency. Negative for flank pain.  Musculoskeletal: Negative for back pain, neck pain and neck stiffness.  Skin: Negative for rash and wound.  Neurological: Positive for light-headedness. Negative for seizures, syncope, weakness, numbness and headaches.  Psychiatric/Behavioral: Negative for agitation and confusion.  All other systems reviewed and are negative.    Physical Exam Updated Vital Signs BP (!) 105/51 (BP Location: Right Arm)   Pulse 60   Temp 98.4 F (36.9 C) (Oral)   Resp 20   SpO2 98%   Physical Exam  Constitutional: She is oriented to person, place, and time. She appears well-developed and well-nourished. No distress.  HENT:  Head: Normocephalic and atraumatic.  Mouth/Throat: Oropharynx is clear and moist. No oropharyngeal exudate.  Eyes: Pupils are equal, round, and reactive to light. Conjunctivae and EOM are normal.  Neck: Normal range of motion. Neck supple.  Cardiovascular: Normal rate and regular rhythm.  No murmur heard. Pulmonary/Chest: Effort normal and breath sounds normal. No respiratory distress. She has no wheezes. She has no rales. She exhibits no tenderness.   Abdominal: Soft. She exhibits no distension. There is no tenderness. There is no guarding.  Musculoskeletal: She exhibits no edema or tenderness.  Neurological: She is alert and oriented to person, place, and time. No sensory deficit. She exhibits normal muscle tone.  Skin: Skin is warm and dry. Capillary refill takes less than 2 seconds. No rash noted. She is not diaphoretic. No erythema.  Psychiatric: She has a normal mood and affect.  Nursing note and vitals reviewed.    ED Treatments / Results  Labs (all labs ordered are listed, but only abnormal results are displayed) Labs Reviewed  CBC WITH DIFFERENTIAL/PLATELET - Abnormal; Notable for the following components:      Result Value   RBC 2.77 (*)    Hemoglobin 8.9 (*)    HCT 27.8 (*)    MCV 100.4 (*)    All other components within normal limits  COMPREHENSIVE METABOLIC PANEL - Abnormal; Notable for the following components:   Glucose, Bld 237 (*)    Creatinine, Ser 1.88 (*)    Total Protein 6.4 (*)    Albumin 3.4 (*)    GFR calc non Af Amer 24 (*)    GFR calc Af Amer 28 (*)    All other components within normal limits  URINALYSIS, ROUTINE W REFLEX MICROSCOPIC - Abnormal; Notable for the following components:   APPearance HAZY (*)    All other components within normal limits  I-STAT CG4 LACTIC ACID, ED - Abnormal; Notable for the following components:   Lactic Acid, Venous 1.98 (*)    All other components within normal limits  URINE CULTURE  CULTURE, BLOOD (ROUTINE X 2)  CULTURE, BLOOD (ROUTINE X 2)  BRAIN NATRIURETIC PEPTIDE  MAGNESIUM  TSH  OCCULT BLOOD X 1 CARD TO LAB, STOOL  I-STAT TROPONIN, ED  I-STAT CG4 LACTIC ACID, ED  I-STAT TROPONIN, ED    EKG EKG Interpretation  Date/Time:  Tuesday September 12 2017 12:04:18 EDT Ventricular Rate:  60 PR Interval:    QRS Duration: 98 QT Interval:  649 QTC Calculation: 649 R Axis:   28 Text Interpretation:  Sinus rhythm Prolonged PR interval Probable anterior infarct,  age indeterminate Prolonged QT interval When compared to prior, posible sinus rhythm compared to atrial paced. also longer QTc.   No STEMI Confirmed by Antony Blackbird [phone]) on 09/12/2017 12:18:16 PM   Radiology Dg Chest 2 View  Result Date: 09/12/2017 CLINICAL DATA:  Cough.  Chills.  Hypotension. EXAM: CHEST - 2 VIEW COMPARISON:  08/23/2017. FINDINGS: Cardiac pacer with lead tip over the right atrium and right ventricle. Cardiomegaly with normal pulmonary vascularity. Lungs are clear. No pleural effusion or pneumothorax. No acute bony abnormality. Degenerative change thoracic spine. IMPRESSION: 1. Cardiac pacer with lead tips over the right atrium right ventricle. Cardiomegaly. 2.  No acute pulmonary disease. Electronically Signed   By: Marcello Moores  Register   On: 09/12/2017 12:44    Procedures Procedures (including critical care time)  Medications Ordered in ED Medications  sodium chloride 0.9 % bolus 500 mL (has no administration in time range)  sodium chloride 0.9 % bolus 500 mL (0 mLs Intravenous Stopped 09/12/17 1354)     Initial Impression / Assessment and Plan / ED Course  I have reviewed the triage vital signs and the nursing notes.  Pertinent labs & imaging results that were available  during my care of the patient were reviewed by me and considered in my medical decision making (see chart for details).     Tara Jackson is a 77 y.o. female with a past medical history significant for hypertension, hyperlipidemia, CHF with pacemaker , diabetes, prior DVT/PE on Plavix therapy, obesity, CKD, stroke/TIA, and anemia who presents with hypotensive near syncopal episode.  Patient reports that she has had dysuria for the last few days as well as frequency.  She has had some chills and nonproductive cough.  She reports that today while at her physical therapy/rehab session, she began feeling lightheaded and very fatigued.  She reports her blood pressure dropped into the 80s when it was checked.   She was also having chest pain with it.  It was a sharp pain in her chest.  She reports that pain has improved.  She reported no shortness of breath, nausea, or vomiting.  She reports no constipation or diarrhea.  She reports that her legs are more swollen than normal bilaterally.  She denies other complaints on arrival.  On arrival, patient's blood pressure is around 540 systolic.  It was in the 90s on my initial assessment.  Patient still feels lightheaded when she sits up.  Her lungs were clear and chest was tender on the left chest.  Abdomen was nontender.  Patient had mild edema in bilateral lower extremities.  No leg tenderness.  Patient was alert and oriented.  Clinically I am concerned that the patient's blood pressure is been very erratic today.  She reports is in the 150s before he started her training session.  In the setting of her dysuria, cough, and chills we will have work-up to look for infection contributing.  Patient also given a small amount of fluids.  Will be careful and rehydration given her history of CHF, her report of leg edema.  BNP will be included.    Pacemaker will be interrogated to look for arrhythmias or other abnormalities.    Anticipate reassessment after work-up.  Initial lactic acid was elevated at 1.98.  Repeat lactic acid improving.  Troponin negative x2.  CBC shows no leukocytosis the patient does have a worsened anemia from prior.  Patient denied any bleeding.  BNP is not elevated.  TSH was within normal limits.  Chest x-ray revealed no evidence of pneumonia or fluid overload.  Still awaiting urinalysis as I suspect with her urinary symptoms and the low blood pressures, patient may have a UTI.    The patient's pacemaker was interrogated by the pacemaker team and they report patient is in sinus rhythm without being paced and had no episodes over the last few days.  The reported no problems with the pacemaker.  4:26 PM Fecal occult test was ordered.  Patient's  repeat blood pressure was 97 systolic.  Patient still feels short of breath while laying down.  Patient be given 500 cc more fluid and will have orthostatics checked.  I am concerned about the patient's labile blood pressures in the near syncopal episode in the setting of the decreasing hemoglobin.  If the patient is found to have a positive fecal occult or she continues to have low blood pressures, patient will likely need admission for further management of her hypotension and lightheadedness in the setting of CHF and close fluid management.   Final Clinical Impressions(s) / ED Diagnoses   Final diagnoses:  Hypotension, unspecified hypotension type  Chest pain, unspecified type    Clinical Impression: 1. Hypotension, unspecified  hypotension type   2. Chest pain, unspecified type     Disposition: Care transferred to Dr. Regenia Skeeter while awaiting results of fecal occult testing, rehydration, and orthostatic testing.  If patient has any ab normalities with these, she will likely require admission.  This note was prepared with assistance of Systems analyst. Occasional wrong-word or sound-a-like substitutions may have occurred due to the inherent limitations of voice recognition software.      Dayona Shaheen, Gwenyth Allegra, MD 09/12/17 [phone]

## 2017-09-12 NOTE — ED Notes (Signed)
ED Provider at bedside. 

## 2017-09-12 NOTE — ED Provider Notes (Signed)
Patient care transferred to me.  Patient remains hypotensive, blood pressure varying between high 80s and low 90s.  This is while at rest.  Previously after some IV fluid she had orthostatics performed which showed her blood pressure dropped to the 60s.  Unclear where the hypotension is coming from but does not appear to be infectious or sepsis.  She will continue to be given small boluses of fluid and admitted to the hospitalist service.   Sherwood Gambler, MD 09/12/17 [phone]

## 2017-09-12 NOTE — ED Notes (Signed)
Education administrator received and given to [employer] .

## 2017-09-12 NOTE — ED Notes (Signed)
Pt has an implanted Web designer. Interrogated by this RN and awaiting transmittal results.

## 2017-09-12 NOTE — ED Notes (Signed)
Attending at bedside.

## 2017-09-12 NOTE — ED Triage Notes (Signed)
[facility]-  With patient from home who was working with PT this morning. Per therapist pts BPs went from 155/77 to 85/45 and 90/52. Pt reports last night feeling dizziness and stinging with urination. Denies chest pain, n/v/d  Vitals   100/68 60 P 24 resp cbg 346

## 2017-09-12 NOTE — H&P (Addendum)
History and Physical    Tara Jackson MCN:[id number] DOB: 1940-09-18 DOA: 09/12/2017  PCP: Shon Baton, MD   Patient coming from: Home  Chief Complaint: Hypotension  HPI: Tara Jackson is a 77 y.o. female with medical history significant for HTN, DM, CKD 3, diastolic dysfunction.  Today patient was working with physical therapy, when patient was standing up, patient's blood pressure dropped from 155/77-85/45 and 90/52.  She felt dizzy.  Patient reports chest pain here in the ED, none prior to arrival.  Reports central chest pain radiating to her back, described as a tightness, started when she was lying down and self resolved without intervention.  Reports chronic unchanged shortness of breath on home 2 L mostly at night.  No associated nausea or diaphoresis or palpitations. No cough.  Reports intermittent subjective fevers at night for several weeks. Patient reports dysuria of 2 days.  No abdominal pain no nausea vomiting, reports 4 days of loose stools a week ago, she has maintained normal p.o. Intake.  Takes Lasix 20 mg daily. Patient denies NSAID use, denies blood in stools or black stools.  ED Course: Blood pressure down to 89/55 improved with 1 L bolus in ED, but still soft.  Hemoglobin 8.9 down from 11.3, 18-month ago.  Creatinine elevated but at baseline 1.8.  UA clean.  Lactic acid mildly elevated 1.9 >1.3.  EKG sinus rhythm prolonged QTC 649. two-view chest x-ray no acute abnormality.  Blood cultures were obtained.  Hospitalist was called to admit for hypotension and anemia.  Review of Systems: As per HPI all other systems reviewed and negative.  Past Medical History:  Diagnosis Date  . Anemia   . Anxiety   . Arthritis   . Asthma   . Brittle bone disease   . Cataract   . CKD (chronic kidney disease)   . Complication of anesthesia    hard to wake up after anesthesia, trouble turning head  . Coronary artery disease, non-occlusive 2014   a. minimal CAD by cath in 2014 with 40%  RI stenosis. b. low-risk NST in 11/2014.  Marland Kitchen Depression   . Diabetes mellitus type 2, insulin dependent ([place])   . Diabetic neuropathy ([place])   . Diastolic dysfunction, left ventricle 11/30/14   EF 55%, grade 1 DD  . DVT (deep venous thrombosis) ([place])    "18 in RLE; 7 LLE prior to PE" (03/24/2015)  . Family history of adverse reaction to anesthesia    " the whole family is hard to wake up"  . Glaucoma   . H/O hiatal hernia   . Hyperlipidemia   . Hypertension associated with diabetes ([place])   . Hypothyroidism   . Kidney stones    "passed them"  . Memory loss 06/03/2014  . On home oxygen therapy    "2L oxygen concentrator @ night"  . Osteoporosis   . Oxygen desaturation during sleep    wears 2 liters of oxygen at night   . Palpitations 35years ago   Cardionet monitor - revealed mostly normal sinus rhythm, sinus bradycardia with first-degree A-V block heart rates mostly in the 50s and 60s with some 70s. No arrhythmias, PVCs or PACs noted.  . PE (pulmonary thromboembolism) ([place]) x 3, last one was ~ 2003   history of recurrent RLE DVT with PE - last PE ~>13 yrs; Maintained on Plavix  . Pneumonia   . Presence of permanent cardiac pacemaker   . Restless legs syndrome   . Sleep apnea  cpap disontinued; test done 07/14/2008 ordered per  [place]rum  . Spinal headache   . Spinal stenosis    L 3, L 4 and L 5, and C 1, C 2 and C3  . Stroke [facility]) 04-19-2013   tia and stroke. Weakness Rt hand  . 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 s/p [employer] PPM in 03/2015 by Dr. Lovena Le secondary to sinus node dysfunction.   Marland Kitchen TIA (transient ischemic attack) March 2014    Past Surgical History:  Procedure Laterality Date  . ABDOMINAL HYSTERECTOMY    . APPENDECTOMY    . BACK SURGERY     steroid inj  . CARDIAC CATHETERIZATION  02/14/2012   no evidence of obstructive coronary disease ,low EDP with normal EF  . CATARACT EXTRACTION W/ INTRAOCULAR LENS  IMPLANT, BILATERAL Bilateral 2000s  .  CHOLECYSTECTOMY    . DOPPLER ECHOCARDIOGRAPHY  2014; October 2016   a. EF 79-02%; normal diastolic pressures, no regional wall motion motion abnormalities;; b/ F 55-60%. Normal wall motion. GR 1 DD. No valve lesions  . EP IMPLANTABLE DEVICE N/A 03/24/2015   [facility] PPM, Dr. Lovena Le  . ESOPHAGOGASTRODUODENOSCOPY  08/09/2011   Procedure: ESOPHAGOGASTRODUODENOSCOPY (EGD);  Surgeon: Jeryl Columbia, MD;  Location: [facility];  Service: Endoscopy;  Laterality: N/A;  . ESOPHAGOGASTRODUODENOSCOPY (EGD) WITH PROPOFOL N/A 11/12/2013   Procedure: ESOPHAGOGASTRODUODENOSCOPY (EGD) WITH PROPOFOL;  Surgeon: Jeryl Columbia, MD;  Location: [facility] ENDOSCOPY;  Service: Endoscopy;  Laterality: N/A;  . ESOPHAGOGASTRODUODENOSCOPY (EGD) WITH PROPOFOL N/A 05/05/2016   Procedure: ESOPHAGOGASTRODUODENOSCOPY (EGD) WITH PROPOFOL;  Surgeon: Clarene Essex, MD;  Location: [facility] ENDOSCOPY;  Service: Endoscopy;  Laterality: N/A;  . GLAUCOMA SURGERY Bilateral 2000s   "laser"  . HOT HEMOSTASIS  08/09/2011   Procedure: HOT HEMOSTASIS (ARGON PLASMA COAGULATION/BICAP);  Surgeon: Jeryl Columbia, MD;  Location: [facility];  Service: Endoscopy;  Laterality: N/A;  . HOT HEMOSTASIS N/A 11/12/2013   Procedure: HOT HEMOSTASIS (ARGON PLASMA COAGULATION/BICAP);  Surgeon: Jeryl Columbia, MD;  Location: [facility];  Service: Endoscopy;  Laterality: N/A;  . INSERT / REPLACE / REMOVE PACEMAKER  03/24/2015  . JOINT REPLACEMENT    . KNEE SURGERY Left done with 2nd left knee replacement   spacer bar placement  . LEFT HEART CATHETERIZATION WITH CORONARY ANGIOGRAM N/A 02/14/2012   Procedure: LEFT HEART CATHETERIZATION WITH CORONARY ANGIOGRAM;  Surgeon: Leonie Man, MD;  Location: [facility] CATH LAB;  Service: Cardiovascular;  Laterality: N/A;  . LEV DOPPLER  05/29/2012   RIGHT EXTREM. NORMAL VENOUS DUPLEX  . NM MYOCAR PERF WALL MOTION  01/26/2012; 11/2014   a. EF 79%,LV normal,ST SEGMENT CHANGE SUGGESTIVE OF ISCHEMIA.; LOW RISK, EF 60%. No ischemia or  infarction. No wall motion abnormality   . PACEMAKER PLACEMENT    . REVISION TOTAL KNEE ARTHROPLASTY Left   . TONSILLECTOMY    . TOTAL KNEE ARTHROPLASTY Bilateral   . TRANSTHORACIC ECHOCARDIOGRAM  10/2016   EF 65-70%.  Vigorous LV function.  Mild LVH.  Poor acoustic windows.  Cannot assess diastolic function or significant valvular dysfunction.  Marland Kitchen TRIGGER FINGER RELEASE  11/22/2011   Procedure: RELEASE TRIGGER FINGER/A-1 PULLEY;  Surgeon: Meredith Pel, MD;  Location: [facility];  Service: Orthopedics;  Laterality: Left;  Left trigger thumb release  . TRIGGER FINGER RELEASE Right yrs ago     reports that she has never smoked. She has never used smokeless tobacco. She reports that she does not drink alcohol or use drugs.  Allergies  Allergen Reactions  .  Iodine Other (See Comments)    ONLY IV--Makes unconscious  . Iohexol Other (See Comments)     Code: SOB, Desc: cardiac arrest w/ iv contrast, has never used 13 hr prep//alice calhoun, Onset Date: [id number]   . Metoclopramide Hcl Other (See Comments)    suicidal  . Sulfa Antibiotics Hives  . Lactose Intolerance (Gi) Diarrhea  . Lansoprazole Nausea And Vomiting    Family History  Problem Relation Age of Onset  . Cancer Mother   . Cancer - Prostate Father     Prior to Admission medications   Medication Sig Start Date End Date Taking? Authorizing Provider  acetaminophen (TYLENOL) 500 MG tablet Take 2 tablets (1,000 mg total) by mouth every 6 (six) hours as needed. 06/12/17  Yes Charlesetta Shanks, MD  albuterol (PROVENTIL HFA;VENTOLIN HFA) 108 (90 BASE) MCG/ACT inhaler Inhale 2 puffs into the lungs every 6 (six) hours as needed for wheezing.   Yes [provider]  bisacodyl (FLEET) 10 MG/30ML ENEM Place 10 mg rectally once.   Yes [provider]  busPIRone (BUSPAR) 10 MG tablet Take 1 tablet (10 mg total) by mouth every morning. 06/03/14  Yes Garvin Fila, MD  Cholecalciferol (VITAMIN D-3 PO) Take 2 tablets by mouth  every morning.    Yes [provider]  clonazePAM (KLONOPIN) 0.5 MG tablet Take 0.5-1 mg by mouth See admin instructions. Takes 2 tabs in am and 1 tab in pm.   Yes [provider]  clopidogrel (PLAVIX) 75 MG tablet Take 75 mg by mouth every morning.   Yes [provider]  DM-APAP-CPM (VICKS FORMULA 44 COUGH/COLD PM PO) Take by mouth. Used 1/2 cup   Yes [provider]  furosemide (LASIX) 20 MG tablet Take 1 tablet (20 mg total) by mouth daily. 06/12/17 09/12/17 Yes Leonie Man, MD  glipiZIDE (GLUCOTROL) 10 MG tablet Take 20 mg by mouth 2 (two) times daily before a meal.  05/03/14  Yes [provider]  guaiFENesin (MUCINEX) 600 MG 12 hr tablet Take 600 mg by mouth 2 (two) times daily as needed for cough.   Yes [provider]  insulin NPH (HUMULIN N,NOVOLIN N) 100 UNIT/ML injection Inject 0-20 Units into the skin See admin instructions. Novolin-N Give 20 unit in morning; give  5 units at 4pm; evening dose as needed.  If less than 150 blood sugar= 0units.  If greater than 200 blood sugar= give 10units to decrease by 100 blood sugar   Yes [provider]  insulin regular (NOVOLIN R,HUMULIN R) 100 units/mL injection Inject 0-25 Units into the skin See admin instructions. Give 25 unit in morning; give 20units at 4pm; evening dose as needed.  If less than 150 blood sugar= 0units.  If greater than 200 blood sugar= give 10units to decrease by 100 blood sugar   Yes [provider]  ipratropium (ATROVENT) 0.03 % nasal spray Place 2 sprays into both nostrils every 12 (twelve) hours. Patient taking differently: Place 2 sprays into both nostrils every 12 (twelve) hours as needed for rhinitis.  10/14/16  Yes Patrecia Pour, MD  irbesartan (AVAPRO) 150 MG tablet Take 150 mg by mouth daily. 06/22/17  Yes [provider]  levothyroxine (SYNTHROID, LEVOTHROID) 100 MCG tablet Take 100 mcg by mouth every morning.    Yes [provider]    lidocaine (LIDODERM) 5 % Place 3 patches onto the skin daily as needed (pain). Remove & Discard patch within 12 hours or as directed by MD  Yes [provider]  linaclotide (LINZESS) 290 MCG CAPS capsule Take 290 mcg by mouth daily before breakfast.   Yes [provider]  LORazepam (ATIVAN) 0.5 MG tablet Take 0.5 mg by mouth daily as needed for anxiety.   Yes [provider]  magnesium 30 MG tablet Take 30 mg by mouth daily.   Yes [provider]  nortriptyline (PAMELOR) 50 MG capsule TAKE 2 CAPSULES (100MG ) AT BEDTIME Patient taking differently: TAKE 1 CAPSULES (100MG ) AT two times daily 06/02/16  Yes Garvin Fila, MD  ondansetron (ZOFRAN) 4 MG tablet Take 4 mg by mouth as needed. 08/16/17  Yes [provider]  OXYGEN Inhale 2 L into the lungs as needed. Used at overnight with a concentrator    Yes [provider]  polyethylene glycol (MIRALAX / GLYCOLAX) packet Take 17 g by mouth daily as needed for mild constipation.    Yes [provider]  potassium chloride SA (K-DUR,KLOR-CON) 20 MEQ tablet Take 1 tablet (20 mEq total) by mouth 2 (two) times daily. 06/22/15  Yes Davonna Belling, MD  rosuvastatin (CRESTOR) 20 MG tablet Take 20 mg by mouth daily.  03/13/17  Yes [provider]  solifenacin (VESICARE) 10 MG tablet Take 10 mg by mouth daily as needed (overactive bladder).    Yes [provider]  terconazole (TERAZOL 7) 0.4 % vaginal cream Place 1 applicator vaginally at bedtime as needed (for yeast infection).  04/18/14  Yes [provider]  traMADol (ULTRAM) 50 MG tablet Take 1 tablet (50 mg total) by mouth every 12 (twelve) hours as needed for moderate pain. Patient taking differently: Take 50 mg by mouth 2 (two) times daily as needed for moderate pain.  03/27/15  Yes Bhagat, Bhavinkumar, PA  traMADol (ULTRAM) 50 MG tablet 1 to 2 tablets every 6 hours as needed for pain.  You may take this with  acetaminophen. Patient not taking: Reported on 09/12/2017 06/12/17   Charlesetta Shanks, MD    Physical Exam: Vitals:   09/12/17 1700 09/12/17 1715 09/12/17 1730 09/12/17 1745  BP: (!) 95/39 (!) 95/45 (!) 89/55 (!) 95/49  Pulse: (!) 59 (!) 59 (!) 59 60  Resp: (!) 22 (!) 23 18 16   Temp:      TempSrc:      SpO2: 99% 98% 98% 97%    Constitutional: NAD, calm, comfortable Vitals:   09/12/17 1700 09/12/17 1715 09/12/17 1730 09/12/17 1745  BP: (!) 95/39 (!) 95/45 (!) 89/55 (!) 95/49  Pulse: (!) 59 (!) 59 (!) 59 60  Resp: (!) 22 (!) 23 18 16   Temp:      TempSrc:      SpO2: 99% 98% 98% 97%   Eyes: PERRL, lids and conjunctivae normal ENMT: Mucous membranes are moist. Posterior pharynx clear of any exudate or lesions.Normal dentition.  Neck: normal, supple, no masses, no thyromegaly Respiratory: clear to auscultation bilaterally, no wheezing, no crackles. Normal respiratory effort. No accessory muscle use.  Cardiovascular: Regular rate and rhythm, 2/6 systolic murmurs, no / rubs / gallops. No extremity edema. 2+ pedal pulses. No carotid bruits.  Abdomen: no tenderness, no masses palpated. No hepatosplenomegaly. Bowel sounds positive.  Musculoskeletal: no clubbing / cyanosis. No joint deformity upper and lower extremities. Good ROM, no contractures. Normal muscle tone.  Skin: no rashes, lesions, ulcers. No induration Neurologic: CN 2-12 grossly intact. Strength 5/5 in all 4.  Psychiatric: Normal judgment and insight. Alert and oriented x 3. Normal mood.   Labs on Admission:  I have personally reviewed following labs and imaging studies  CBC: Recent Labs  Lab 09/12/17 1215  WBC 9.2  NEUTROABS 6.3  HGB 8.9*  HCT 27.8*  MCV 100.4*  PLT 025   Basic Metabolic Panel: Recent Labs  Lab 09/12/17 1215  NA 136  K 4.3  CL 98  CO2 28  GLUCOSE 237*  BUN 23  CREATININE 1.88*  CALCIUM 9.2  MG 1.8   GFR: CrCl cannot be calculated (Unknown ideal weight.). Liver Function Tests: Recent Labs   Lab 09/12/17 1215  AST 22  ALT 12  ALKPHOS 66  BILITOT 0.6  PROT 6.4*  ALBUMIN 3.4*   Thyroid Function Tests: Recent Labs    09/12/17 1215  TSH 4.194   Anemia Panel: No results for input(s): VITAMINB12, FOLATE, FERRITIN, TIBC, IRON, RETICCTPCT in the last 72 hours. Urine analysis:    Component Value Date/Time   COLORURINE YELLOW 09/12/2017 1215   APPEARANCEUR HAZY (A) 09/12/2017 1215   LABSPEC 1.011 09/12/2017 1215   PHURINE 5.0 09/12/2017 1215   GLUCOSEU NEGATIVE 09/12/2017 1215   HGBUR NEGATIVE 09/12/2017 1215   BILIRUBINUR NEGATIVE 09/12/2017 1215   KETONESUR NEGATIVE 09/12/2017 1215   PROTEINUR NEGATIVE 09/12/2017 1215   UROBILINOGEN 1.0 11/28/2014 1125   NITRITE NEGATIVE 09/12/2017 1215   LEUKOCYTESUR NEGATIVE 09/12/2017 1215    Radiological Exams on Admission: Dg Chest 2 View  Result Date: 09/12/2017 CLINICAL DATA:  Cough.  Chills.  Hypotension. EXAM: CHEST - 2 VIEW COMPARISON:  08/23/2017. FINDINGS: Cardiac pacer with lead tip over the right atrium and right ventricle. Cardiomegaly with normal pulmonary vascularity. Lungs are clear. No pleural effusion or pneumothorax. No acute bony abnormality. Degenerative change thoracic spine. IMPRESSION: 1. Cardiac pacer with lead tips over the right atrium right ventricle. Cardiomegaly. 2.  No acute pulmonary disease. Electronically Signed   By: Marcello Moores  Register   On: 09/12/2017 12:44    EKG: Independently reviewed.  Long QTC 649.   Assessment/Plan Active Problems:   Essential hypertension   Diastolic dysfunction, left ventricle   Type 2 diabetes mellitus with renal manifestations ([place])   CKD stage 3 due to type 2 diabetes mellitus ([facility])   Hypotension   Orthostatic hypotension- s/p 1L bolus, bp improved.  Initial concern for infectious etiology-UA clean despite dysuria, clear chest x-ray. ?relation to hemoglobin drop.  Pacemaker interrogated in the ED-patient is in sinus rhythm without been placed on episodes over the  last few days. -Hydrate normal saline 100 cc/h x 12 hours -F/u blood and urine cultures drawn in ED -Hold home irbesartan Lasix  Chest pain in ED-atypical.  EKG without ST or T wave abnormalities, similar to prior.  History of abnormal nuclear stress test with normal coronaries per cardiology notes 06/2017 -Continue Plavix -Tropes x3 - EKG a.m  Acute anemia-hemoglobin 8.9, baseline 10-11.  Last check 11.3 one-month ago.  Stool occult negative X 1. -Anemia panel a.m - CBC a.m - NPO midnight, in case of significant Hgb drop needing endoscopy.  Prolonged QTC- 649. k- 4.3.  Magnesium 1.8.  -EKG a.m. -Hold QTC prolonging medication nortriptyline  DM- 02/2017- hgba1c- 7.9. -Continue home glimepiride, while NPO - SSI -Continue Novolin at reduced dose  History of diastolic dysfunction, pace maker status-appears stable. On ARB and low-dose Lasix 20 daily.  Follows with cardiology Dr. Ellyn Hack.  06/2017- With complaints of orthostatic hypotension medication doses were reduced, pacemaker was interrogated with no events.  Last echo 2018 EF 65 to 70% mild LVH.  Anxiety- -continue home Klonopin  and buspirone, hold nortriptyline  CKD4- Cr- 1.8, stable.    DVT prophylaxis: SCDs Code Status: full Family Communication: Pulse and friend at bedside Disposition Plan:  Per rounding team,  Consults called: none Admission status: obs, tele   Bethena Roys MD [facility] Pager [phone] From 6PM-2AM.  Otherwise please contact night-coverage [email or web address] Password [facility]  09/12/2017, 7:18 PM

## 2017-09-12 NOTE — ED Notes (Signed)
Patient transported to X-ray 

## 2017-09-13 ENCOUNTER — Other Ambulatory Visit: Payer: Self-pay

## 2017-09-13 ENCOUNTER — Encounter (HOSPITAL_COMMUNITY): Payer: Self-pay

## 2017-09-13 DIAGNOSIS — E1140 Type 2 diabetes mellitus with diabetic neuropathy, unspecified: Secondary | ICD-10-CM | POA: Diagnosis not present

## 2017-09-13 DIAGNOSIS — N184 Chronic kidney disease, stage 4 (severe): Secondary | ICD-10-CM | POA: Diagnosis not present

## 2017-09-13 DIAGNOSIS — E785 Hyperlipidemia, unspecified: Secondary | ICD-10-CM | POA: Diagnosis not present

## 2017-09-13 DIAGNOSIS — N183 Chronic kidney disease, stage 3 (moderate): Secondary | ICD-10-CM | POA: Diagnosis not present

## 2017-09-13 DIAGNOSIS — I10 Essential (primary) hypertension: Secondary | ICD-10-CM | POA: Diagnosis not present

## 2017-09-13 DIAGNOSIS — I130 Hypertensive heart and chronic kidney disease with heart failure and stage 1 through stage 4 chronic kidney disease, or unspecified chronic kidney disease: Secondary | ICD-10-CM | POA: Diagnosis not present

## 2017-09-13 DIAGNOSIS — R69 Illness, unspecified: Secondary | ICD-10-CM | POA: Diagnosis not present

## 2017-09-13 DIAGNOSIS — I951 Orthostatic hypotension: Secondary | ICD-10-CM | POA: Diagnosis not present

## 2017-09-13 DIAGNOSIS — R079 Chest pain, unspecified: Secondary | ICD-10-CM | POA: Diagnosis not present

## 2017-09-13 DIAGNOSIS — E1121 Type 2 diabetes mellitus with diabetic nephropathy: Secondary | ICD-10-CM

## 2017-09-13 DIAGNOSIS — G2581 Restless legs syndrome: Secondary | ICD-10-CM | POA: Diagnosis not present

## 2017-09-13 DIAGNOSIS — E039 Hypothyroidism, unspecified: Secondary | ICD-10-CM | POA: Diagnosis not present

## 2017-09-13 DIAGNOSIS — E1122 Type 2 diabetes mellitus with diabetic chronic kidney disease: Secondary | ICD-10-CM | POA: Diagnosis not present

## 2017-09-13 LAB — TROPONIN I
Troponin I: <0.03 ng/mL (ref <0.03)
Troponin I: <0.03 ng/mL (ref <0.03)

## 2017-09-13 LAB — GLUCOSE, CAPILLARY
Glucose-Capillary: 126 mg/dL — ABNORMAL HIGH (ref 70–99)
Glucose-Capillary: 164 mg/dL — ABNORMAL HIGH (ref 70–99)
Glucose-Capillary: 218 mg/dL — ABNORMAL HIGH (ref 70–99)
Glucose-Capillary: 234 mg/dL — ABNORMAL HIGH (ref 70–99)

## 2017-09-13 LAB — RETICULOCYTES
RBC.: 3.06 MIL/uL — ABNORMAL LOW (ref 3.87–5.11)
Retic Count, Absolute: 52 10*3/uL (ref 19.0–186.0)
Retic Ct Pct: 1.7 % (ref 0.4–3.1)

## 2017-09-13 LAB — BASIC METABOLIC PANEL
Anion gap: 9 (ref 5–15)
BUN: 21 mg/dL (ref 8–23)
CO2: 26 mmol/L (ref 22–32)
Calcium: 8.2 mg/dL — ABNORMAL LOW (ref 8.9–10.3)
Chloride: 102 mmol/L (ref 98–111)
Creatinine, Ser: 1.66 mg/dL — ABNORMAL HIGH (ref 0.44–1.00)
GFR calc Af Amer: 33 mL/min — ABNORMAL LOW (ref >60)
GFR calc non Af Amer: 28 mL/min — ABNORMAL LOW (ref >60)
Glucose, Bld: 136 mg/dL — ABNORMAL HIGH (ref 70–99)
Potassium: 3.9 mmol/L (ref 3.5–5.1)
Sodium: 137 mmol/L (ref 135–145)

## 2017-09-13 LAB — IRON AND TIBC
Iron: 53 ug/dL (ref 28–170)
Saturation Ratios: 17 % (ref 10.4–31.8)
TIBC: 312 ug/dL (ref 250–450)
UIBC: 259 ug/dL

## 2017-09-13 LAB — CBC
HCT: 30.6 % — ABNORMAL LOW (ref 36.0–46.0)
Hemoglobin: 9.8 g/dL — ABNORMAL LOW (ref 12.0–15.0)
MCH: 32 pg (ref 26.0–34.0)
MCHC: 32 g/dL (ref 30.0–36.0)
MCV: 100 fL (ref 78.0–100.0)
Platelets: 213 10*3/uL (ref 150–400)
RBC: 3.06 MIL/uL — ABNORMAL LOW (ref 3.87–5.11)
RDW: 12 % (ref 11.5–15.5)
WBC: 5.8 10*3/uL (ref 4.0–10.5)

## 2017-09-13 LAB — VITAMIN B12: Vitamin B-12: 282 pg/mL (ref 180–914)

## 2017-09-13 LAB — FERRITIN: Ferritin: 175 ng/mL (ref 11–307)

## 2017-09-13 LAB — URINE CULTURE: Culture: <10000 — AB

## 2017-09-13 LAB — FOLATE: Folate: 17.3 ng/mL (ref >5.9)

## 2017-09-13 MED ORDER — VITAMIN B-12 1000 MCG PO TABS
500.0000 ug | ORAL_TABLET | Freq: Every day | ORAL | Status: DC
  Administered 2017-09-13 – 2017-09-14 (×2): 500 ug via ORAL
  Filled 2017-09-13 (×2): qty 1

## 2017-09-13 MED ORDER — SODIUM CHLORIDE 0.9 % IV SOLN
INTRAVENOUS | Status: AC
  Administered 2017-09-13: 17:00:00 via INTRAVENOUS

## 2017-09-13 MED ORDER — DICYCLOMINE HCL 10 MG PO CAPS
10.0000 mg | ORAL_CAPSULE | Freq: Three times a day (TID) | ORAL | Status: DC
  Administered 2017-09-13 – 2017-09-14 (×3): 10 mg via ORAL
  Filled 2017-09-13 (×3): qty 1

## 2017-09-13 MED ORDER — FAMOTIDINE 20 MG PO TABS
20.0000 mg | ORAL_TABLET | Freq: Two times a day (BID) | ORAL | Status: DC
  Administered 2017-09-13 – 2017-09-14 (×3): 20 mg via ORAL
  Filled 2017-09-13 (×3): qty 1

## 2017-09-13 NOTE — Plan of Care (Signed)
  Problem: Activity: Goal: Capacity to carry out activities will improve Outcome: Progressing   Problem: Education: Goal: Ability to verbalize understanding of medication therapies will improve Outcome: Progressing   Problem: Education: Goal: Ability to demonstrate management of disease process will improve Outcome: Progressing   

## 2017-09-13 NOTE — Progress Notes (Signed)
Paged MD regarding family at bedside requesting update

## 2017-09-13 NOTE — Progress Notes (Signed)
   09/13/17 1125  Clinical Encounter Type  Visited With Patient and family together  Visit Type Initial  Referral From Nurse;Patient  Consult/Referral To Chaplain  Spiritual Encounters  Spiritual Needs Literature;Prayer   Responded to a SCC for HCPA.  Patient indicated she would rather discuss this tomorrow as her spouse was leaving and not coming back until later.  We prayed together.  Will pass on to Chaplain tomorrow to follow up. Chaplain Katherene Ponto

## 2017-09-13 NOTE — Plan of Care (Signed)

## 2017-09-13 NOTE — Plan of Care (Signed)
  Problem: Clinical Measurements: Goal: Ability to maintain clinical measurements within normal limits will improve Outcome: Progressing   Problem: Cardiac: Goal: Ability to achieve and maintain adequate cardiopulmonary perfusion will improve Outcome: Progressing   

## 2017-09-13 NOTE — Progress Notes (Addendum)
Progress Note    [name]  QIH:[id number] DOB: [birth year]  DOA: 09/12/2017 PCP: Shon Baton, MD    Brief Narrative:    Medical records reviewed and are as summarized below:  Tara Jackson is an 77 y.o. female with medical history significant for HTN, DM, CKD 3, diastolic dysfunction.  Today patient was working with physical therapy, when patient was standing up, patient's blood pressure dropped from 155/77-85/45 and 90/52.  She felt dizzy.  Patient reports chest pain here in the ED, none prior to arrival.  Reports central chest pain radiating to her back, described as a tightness, started when she was lying down and self resolved without intervention.  Reports chronic unchanged shortness of breath on home 2 L mostly at night.  No associated nausea or diaphoresis or palpitations. No cough.  Reports intermittent subjective fevers at night for several weeks. Patient reports dysuria of 2 days.  No abdominal pain no nausea vomiting, reports 4 days of loose stools a week ago, she has maintained normal p.o. Intake.  Takes Lasix 20 mg daily. Patient denies NSAID use, denies blood in stools or black stools.    Assessment/Plan:   Active Problems:   Essential hypertension   Diastolic dysfunction, left ventricle   Type 2 diabetes mellitus with renal manifestations ([place])   CKD stage 3 due to type 2 diabetes mellitus (HCC)   Hypotension  Orthostatic hypotension- -per chart review by me this appears to be an on-going issue with near syncope/orthostatic hypotension-- seen by Dr. Ellyn Hack and BP meds/lasix was adjusted   Pacemaker interrogated in the ED-patient is in sinus rhythm  -Hydrate normal saline 100 cc/h  -Hold home irbesartan Lasix -repeat orthos positive with standing-- continue IVF and recheck in AM -only occasionally wears TED hose  Chest pain in ED-atypical.  EKG without ST or T wave abnormalities, similar to prior.  History of abnormal nuclear stress test with normal  coronaries per cardiology notes 06/2017 -Continue Plavix -CE negative  Acute anemia-hemoglobin 8.9, baseline 10-11.  Last check 11.3 one-month ago.  Stool occult negative X 1. -h/h stable  Prolonged QTC- 649. k- 4.3.  Magnesium 1.8.  -Hold QTC prolonging medication nortriptyline  DM- 02/2017- hgba1c- 7.9. -SSI  History of diastolic dysfunction, pace maker status-appears stable. -Follows with cardiology Dr. Ellyn Hack.   -06/2017- With complaints of orthostatic hypotension medication doses were reduced, pacemaker was interrogated with no events.  Last echo 2018 EF 65 to 70% mild LVH.  Anxiety- -continue home Klonopin and buspirone, hold nortriptyline  CKD4-  -? Baseline 1.7?  Patient c/o difficultly swallowing food as well as pill Voice appears muffled some-- will get SLP eval and ? DG esophagus     Medical Consultants:    None.    Subjective:   Got dizzy with standing up.  Was ok with sitting up when orthostatics done  Objective:    Vitals:   09/13/17 0923 09/13/17 0925 09/13/17 0929 09/13/17 1142  BP:    (!) 110/56  Pulse: 60 60 63 64  Resp:    20  Temp:    98 F (36.7 C)  TempSrc:    Oral  SpO2:    97%    Intake/Output Summary (Last 24 hours) at 09/13/2017 1308 Last data filed at 09/13/2017 0917 Gross per 24 hour  Intake 2190.92 ml  Output 200 ml  Net 1990.92 ml   There were no vitals filed for this visit.  Exam: Obese female Chronically ill appearing NAD- getting  orthostatics done rrr No increased work of breathing No pitting LE edema No rashes  Data Reviewed:   I have personally reviewed following labs and imaging studies:  Labs: Labs show the following:   Basic Metabolic Panel: Recent Labs  Lab 09/12/17 1215 09/13/17 0704  NA 136 137  K 4.3 3.9  CL 98 102  CO2 28 26  GLUCOSE 237* 136*  BUN 23 21  CREATININE 1.88* 1.66*  CALCIUM 9.2 8.2*  MG 1.8  --    GFR CrCl cannot be calculated (Unknown ideal weight.). Liver Function  Tests: Recent Labs  Lab 09/12/17 1215  AST 22  ALT 12  ALKPHOS 66  BILITOT 0.6  PROT 6.4*  ALBUMIN 3.4*   No results for input(s): LIPASE, AMYLASE in the last 168 hours. No results for input(s): AMMONIA in the last 168 hours. Coagulation profile No results for input(s): INR, PROTIME in the last 168 hours.  CBC: Recent Labs  Lab 09/12/17 1215 09/13/17 0704  WBC 9.2 5.8  NEUTROABS 6.3  --   HGB 8.9* 9.8*  HCT 27.8* 30.6*  MCV 100.4* 100.0  PLT 290 213   Cardiac Enzymes: Recent Labs  Lab 09/12/17 2300 09/13/17 0704  TROPONINI <0.03 <0.03   BNP (last 3 results) No results for input(s): PROBNP in the last 8760 hours. CBG: Recent Labs  Lab 09/12/17 2308 09/13/17 0750 09/13/17 1110  GLUCAP 196* 126* 164*   D-Dimer: No results for input(s): DDIMER in the last 72 hours. Hgb A1c: No results for input(s): HGBA1C in the last 72 hours. Lipid Profile: No results for input(s): CHOL, HDL, LDLCALC, TRIG, CHOLHDL, LDLDIRECT in the last 72 hours. Thyroid function studies: Recent Labs    09/12/17 1215  TSH 4.194   Anemia work up: Recent Labs    09/13/17 0457 09/13/17 0704  VITAMINB12  --  282  FOLATE 17.3  --   FERRITIN  --  175  TIBC  --  312  IRON  --  53  RETICCTPCT  --  1.7   Sepsis Labs: Recent Labs  Lab 09/12/17 1215 09/12/17 1224 09/12/17 1536 09/13/17 0704  WBC 9.2  --   --  5.8  LATICACIDVEN  --  1.98* 1.32  --     Microbiology Recent Results (from the past 240 hour(s))  Urine culture     Status: Abnormal   Collection Time: 09/12/17 12:15 PM  Result Value Ref Range Status   Specimen Description URINE, RANDOM  Final   Special Requests NONE  Final   Culture (A)  Final    <10,000 COLONIES/mL INSIGNIFICANT GROWTH Performed at [facility], [place] [address] [place], [place] [address]    Report Status 09/13/2017 FINAL  Final    Procedures and diagnostic studies:  Dg Chest 2 View  Result Date: 09/12/2017 CLINICAL DATA:  Cough.   Chills.  Hypotension. EXAM: CHEST - 2 VIEW COMPARISON:  08/23/2017. FINDINGS: Cardiac pacer with lead tip over the right atrium and right ventricle. Cardiomegaly with normal pulmonary vascularity. Lungs are clear. No pleural effusion or pneumothorax. No acute bony abnormality. Degenerative change thoracic spine. IMPRESSION: 1. Cardiac pacer with lead tips over the right atrium right ventricle. Cardiomegaly. 2.  No acute pulmonary disease. Electronically Signed   By: Marcello Moores  Register   On: 09/12/2017 12:44    Medications:   . busPIRone  10 mg Oral q morning - 10a  . clonazePAM  0.5 mg Oral BID  . clopidogrel  75 mg Oral q morning -  10a  . famotidine  20 mg Oral BID  . glipiZIDE  20 mg Oral BID AC  . insulin aspart  0-9 Units Subcutaneous TID WC  . ipratropium  2 spray Each Nare BID  . levothyroxine  100 mcg Oral QAC breakfast  . linaclotide  290 mcg Oral QAC breakfast  . potassium chloride SA  20 mEq Oral BID  . rosuvastatin  20 mg Oral Daily   Continuous Infusions:   LOS: 0 days   Geradine Girt  [facility]   *Please refer to [email or web address], password [email or web address] to get updated schedule on who will round on this patient, as hospitalists switch teams weekly. If 7PM-7AM, please contact night-coverage at [email or web address], password [email or web address] for any overnight needs.  09/13/2017, 1:08 PM

## 2017-09-14 ENCOUNTER — Ambulatory Visit (INDEPENDENT_AMBULATORY_CARE_PROVIDER_SITE_OTHER): Payer: Medicare HMO | Admitting: *Deleted

## 2017-09-14 DIAGNOSIS — N184 Chronic kidney disease, stage 4 (severe): Secondary | ICD-10-CM | POA: Diagnosis not present

## 2017-09-14 DIAGNOSIS — E1121 Type 2 diabetes mellitus with diabetic nephropathy: Secondary | ICD-10-CM | POA: Diagnosis not present

## 2017-09-14 DIAGNOSIS — E1140 Type 2 diabetes mellitus with diabetic neuropathy, unspecified: Secondary | ICD-10-CM | POA: Diagnosis not present

## 2017-09-14 DIAGNOSIS — G2581 Restless legs syndrome: Secondary | ICD-10-CM | POA: Diagnosis not present

## 2017-09-14 DIAGNOSIS — E785 Hyperlipidemia, unspecified: Secondary | ICD-10-CM | POA: Diagnosis not present

## 2017-09-14 DIAGNOSIS — N183 Chronic kidney disease, stage 3 (moderate): Secondary | ICD-10-CM | POA: Diagnosis not present

## 2017-09-14 DIAGNOSIS — R079 Chest pain, unspecified: Secondary | ICD-10-CM

## 2017-09-14 DIAGNOSIS — I5032 Chronic diastolic (congestive) heart failure: Secondary | ICD-10-CM

## 2017-09-14 DIAGNOSIS — E039 Hypothyroidism, unspecified: Secondary | ICD-10-CM | POA: Diagnosis not present

## 2017-09-14 DIAGNOSIS — I130 Hypertensive heart and chronic kidney disease with heart failure and stage 1 through stage 4 chronic kidney disease, or unspecified chronic kidney disease: Secondary | ICD-10-CM | POA: Diagnosis not present

## 2017-09-14 DIAGNOSIS — I951 Orthostatic hypotension: Secondary | ICD-10-CM | POA: Diagnosis not present

## 2017-09-14 DIAGNOSIS — I495 Sick sinus syndrome: Secondary | ICD-10-CM

## 2017-09-14 DIAGNOSIS — E1122 Type 2 diabetes mellitus with diabetic chronic kidney disease: Secondary | ICD-10-CM | POA: Diagnosis not present

## 2017-09-14 DIAGNOSIS — I10 Essential (primary) hypertension: Secondary | ICD-10-CM | POA: Diagnosis not present

## 2017-09-14 DIAGNOSIS — R69 Illness, unspecified: Secondary | ICD-10-CM | POA: Diagnosis not present

## 2017-09-14 LAB — CBC
HCT: 30.2 % — ABNORMAL LOW (ref 36.0–46.0)
Hemoglobin: 9.5 g/dL — ABNORMAL LOW (ref 12.0–15.0)
MCH: 31.8 pg (ref 26.0–34.0)
MCHC: 31.5 g/dL (ref 30.0–36.0)
MCV: 101 fL — ABNORMAL HIGH (ref 78.0–100.0)
Platelets: 207 10*3/uL (ref 150–400)
RBC: 2.99 MIL/uL — ABNORMAL LOW (ref 3.87–5.11)
RDW: 11.9 % (ref 11.5–15.5)
WBC: 5.2 10*3/uL (ref 4.0–10.5)

## 2017-09-14 LAB — BASIC METABOLIC PANEL
Anion gap: 12 (ref 5–15)
BUN: 19 mg/dL (ref 8–23)
CO2: 24 mmol/L (ref 22–32)
Calcium: 8.5 mg/dL — ABNORMAL LOW (ref 8.9–10.3)
Chloride: 104 mmol/L (ref 98–111)
Creatinine, Ser: 1.66 mg/dL — ABNORMAL HIGH (ref 0.44–1.00)
GFR calc Af Amer: 33 mL/min — ABNORMAL LOW (ref >60)
GFR calc non Af Amer: 28 mL/min — ABNORMAL LOW (ref >60)
Glucose, Bld: 239 mg/dL — ABNORMAL HIGH (ref 70–99)
Potassium: 4 mmol/L (ref 3.5–5.1)
Sodium: 140 mmol/L (ref 135–145)

## 2017-09-14 LAB — GLUCOSE, CAPILLARY
Glucose-Capillary: 233 mg/dL — ABNORMAL HIGH (ref 70–99)
Glucose-Capillary: 263 mg/dL — ABNORMAL HIGH (ref 70–99)

## 2017-09-14 MED ORDER — INSULIN NPH (HUMAN) (ISOPHANE) 100 UNIT/ML ~~LOC~~ SUSP
0.0000 [IU] | Freq: Every day | SUBCUTANEOUS | Status: DC
Start: 2017-09-14 — End: 2017-09-14

## 2017-09-14 MED ORDER — FUROSEMIDE 20 MG PO TABS: 20.0000 mg | ORAL_TABLET | Freq: Every day | ORAL | 3 refills | Status: DC | PRN

## 2017-09-14 MED ORDER — INSULIN NPH (HUMAN) (ISOPHANE) 100 UNIT/ML ~~LOC~~ SUSP: 5.0000 [IU] | SUBCUTANEOUS | Status: DC

## 2017-09-14 MED ORDER — DICYCLOMINE HCL 10 MG PO CAPS: 10.0000 mg | ORAL_CAPSULE | Freq: Three times a day (TID) | ORAL | Status: DC

## 2017-09-14 MED ORDER — CYANOCOBALAMIN 500 MCG PO TABS: 500.0000 ug | ORAL_TABLET | Freq: Every day | ORAL | 0 refills | Status: DC

## 2017-09-14 MED ORDER — INSULIN NPH (HUMAN) (ISOPHANE) 100 UNIT/ML ~~LOC~~ SUSP: 20.0000 [IU] | Freq: Every day | SUBCUTANEOUS | Status: DC

## 2017-09-14 MED ORDER — INSULIN NPH (HUMAN) (ISOPHANE) 100 UNIT/ML ~~LOC~~ SUSP
15.0000 [IU] | Freq: Two times a day (BID) | SUBCUTANEOUS | Status: DC
  Filled 2017-09-14: qty 10

## 2017-09-14 NOTE — Progress Notes (Signed)
PT Cancellation Note  Patient Details Name: Tara Jackson MRN: [id number] DOB: 1940-04-10   Cancelled Treatment:    Reason Eval/Treat Not Completed: PT screened, no needs identified, will sign off. Per medical team, pt has worked with mobility tech and no PT needs identified. Pt already has HHPT in place. PT signing off.   [place] 09/14/2017, 2:28 PM

## 2017-09-14 NOTE — Care Management Note (Signed)
Case Management Note  Patient Details  Name: Tara Jackson MRN: [id number] Date of Birth: 1940/05/25  Subjective/Objective:   CHF                Action/Plan: Patient lives at home with spouse; has private insurance with [employer] with prescription drug coverage; pharmacy of choice [facility]; patient reports no problem getting her medication. She is active with [facility] for [facility] services as prior to admission; DME - wheelchair, hovaround scooter, walker. CM will continue to follow for progression of care.  Expected Discharge Date:  09/14/17               Expected Discharge Plan:  [place]  Discharge planning Services  CM Consult  Post Acute Care Choice:  Resumption of Svcs/PTA Provider Choice offered to:  Patient  HH Arranged:  RN, Disease Management, PT, Nurse's Aide [place] Agency:  [place]  Status of Service:  In process, will continue to follow  Sherrilyn Rist [phone] 09/14/2017, 11:25 AM

## 2017-09-14 NOTE — Evaluation (Signed)
Clinical/Bedside Swallow Evaluation Patient Details  Name: Tara Jackson MRN: [id number] Date of Birth: [birth year]  Today's Date: 09/14/2017 Time: SLP Start Time (ACUTE ONLY): 5009 SLP Stop Time (ACUTE ONLY): 0951 SLP Time Calculation (min) (ACUTE ONLY): 28 min  Past Medical History:  Past Medical History:  Diagnosis Date  . Anemia   . Anxiety   . Arthritis   . Asthma   . Brittle bone disease   . Cataract   . CKD (chronic kidney disease)   . Complication of anesthesia    hard to wake up after anesthesia, trouble turning head  . Coronary artery disease, non-occlusive 2014   a. minimal CAD by cath in 2014 with 40% RI stenosis. b. low-risk NST in 11/2014.  Marland Kitchen Depression   . Diabetes mellitus type 2, insulin dependent ([place])   . Diabetic neuropathy ([place])   . Diastolic dysfunction, left ventricle 11/30/14   EF 55%, grade 1 DD  . DVT (deep venous thrombosis) ([place])    "18 in RLE; 7 LLE prior to PE" (03/24/2015)  . Family history of adverse reaction to anesthesia    " the whole family is hard to wake up"  . Glaucoma   . H/O hiatal hernia   . Hyperlipidemia   . Hypertension associated with diabetes ([place])   . Hypothyroidism   . Kidney stones    "passed them"  . Memory loss 06/03/2014  . On home oxygen therapy    "2L oxygen concentrator @ night"  . Osteoporosis   . Oxygen desaturation during sleep    wears 2 liters of oxygen at night   . Palpitations 35years ago   Cardionet monitor - revealed mostly normal sinus rhythm, sinus bradycardia with first-degree A-V block heart rates mostly in the 50s and 60s with some 70s. No arrhythmias, PVCs or PACs noted.  . PE (pulmonary thromboembolism) ([place]) x 3, last one was ~ 2003   history of recurrent RLE DVT with PE - last PE ~>13 yrs; Maintained on Plavix  . Pneumonia   . Presence of permanent cardiac pacemaker   . Restless legs syndrome   . Sleep apnea    cpap disontinued; test done 07/14/2008 ordered per  [place]  . Spinal headache   .  Spinal stenosis    L 3, L 4 and L 5, and C 1, C 2 and C3  . Stroke [facility]) 04-19-2013   tia and stroke. Weakness Rt hand  . 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 s/p [employer] PPM in 03/2015 by Dr. Lovena Le secondary to sinus node dysfunction.   Marland Kitchen TIA (transient ischemic attack) March 2014   Past Surgical History:  Past Surgical History:  Procedure Laterality Date  . ABDOMINAL HYSTERECTOMY    . APPENDECTOMY    . BACK SURGERY     steroid inj  . CARDIAC CATHETERIZATION  02/14/2012   no evidence of obstructive coronary disease ,low EDP with normal EF  . CATARACT EXTRACTION W/ INTRAOCULAR LENS  IMPLANT, BILATERAL Bilateral 2000s  . CHOLECYSTECTOMY    . DOPPLER ECHOCARDIOGRAPHY  2014; October 2016   a. EF 38-18%; normal diastolic pressures, no regional wall motion motion abnormalities;; b/ F 55-60%. Normal wall motion. GR 1 DD. No valve lesions  . EP IMPLANTABLE DEVICE N/A 03/24/2015   [facility] PPM, Dr. Lovena Le  . ESOPHAGOGASTRODUODENOSCOPY  08/09/2011   Procedure: ESOPHAGOGASTRODUODENOSCOPY (EGD);  Surgeon: Jeryl Columbia, MD;  Location: [facility];  Service: Endoscopy;  Laterality: N/A;  . ESOPHAGOGASTRODUODENOSCOPY (EGD) WITH PROPOFOL N/A  11/12/2013   Procedure: ESOPHAGOGASTRODUODENOSCOPY (EGD) WITH PROPOFOL;  Surgeon: Jeryl Columbia, MD;  Location: [facility] ENDOSCOPY;  Service: Endoscopy;  Laterality: N/A;  . ESOPHAGOGASTRODUODENOSCOPY (EGD) WITH PROPOFOL N/A 05/05/2016   Procedure: ESOPHAGOGASTRODUODENOSCOPY (EGD) WITH PROPOFOL;  Surgeon: Clarene Essex, MD;  Location[facility] ENDOSCOPY;  Service: Endoscopy;  Laterality: N/A;  . GLAUCOMA SURGERY Bilateral 2000s   "laser"  . HOT HEMOSTASIS  08/09/2011   Procedure: HOT HEMOSTASIS (ARGON PLASMA COAGULATION/BICAP);  Surgeon: Jeryl Columbia, MD;  Location: [facility];  Service: Endoscopy;  Laterality: N/A;  . HOT HEMOSTASIS N/A 11/12/2013   Procedure: HOT HEMOSTASIS (ARGON PLASMA COAGULATION/BICAP);  Surgeon: Jeryl Columbia, MD;  Location: [facility]  ENDOSCOPY;  Service: Endoscopy;  Laterality: N/A;  . INSERT / REPLACE / REMOVE PACEMAKER  03/24/2015  . JOINT REPLACEMENT    . KNEE SURGERY Left done with 2nd left knee replacement   spacer bar placement  . LEFT HEART CATHETERIZATION WITH CORONARY ANGIOGRAM N/A 02/14/2012   Procedure: LEFT HEART CATHETERIZATION WITH CORONARY ANGIOGRAM;  Surgeon: Leonie Man, MD;  [facility] C[facility] LAB;  Service: Cardiovascular;  Laterality: N/A;  . LEV DOPPLER  05/29/2012   RIGHT EXTREM. NORMAL VENOUS DUPLEX  . NM MYOCAR PERF WALL MOTION  01/26/2012; 11/2014   a. EF 79%,LV normal,ST SEGMENT CHANGE SUGGESTIVE OF ISCHEMIA.; LOW RISK, EF 60%. No ischemia or infarction. No wall motion abnormality   . PACEMAKER PLACEMENT    . REVISION TOTAL KNEE ARTHROPLASTY Left   . TONSILLECTOMY    . TOTAL KNEE ARTHROPLASTY Bilateral   . TRANSTHORACIC ECHOCARDIOGRAM  10/2016   EF 65-70%.  Vigorous LV function.  Mild LVH.  Poor acoustic windows.  Cannot assess diastolic function or significant valvular dysfunction.  Marland Kitchen TRIGGER FINGER RELEASE  11/22/2011   Procedure: RELEASE TRIGGER FINGER/A-1 PULLEY;  Surgeon: Meredith Pel, MD;  Location: [place];  Service: Orthopedics;  Laterality: Left;  Left trigger thumb release  . TRIGGER FINGER RELEASE Right yrs ago   HPI:  Pt is an 77 y.o. female with medical history significant for HTN, DM, CKD 3, diastolic dysfunction, CVA, PNA, memory loss, HH, and anxiety who presents with orthostatic hypotension while working with PT. CXR negative for acute infection, but she did report difficulty swallowing certain foods and pills.    Assessment / Plan / Recommendation Clinical Impression  Pt's oropharyngeal swallow appears functional, although her subjective report of solids feeling "stuck" may suggest a more esophageal component. She does report a h/o reflux and HH, and she points toward her midsternum to show where solids feel stuck. She gets some relief by alternating solids with liquids  and taking her meds whole in puree. Would continue current diet, encouraging pt to avoid hard/dry textures. No further SLP f/u needed, but could consider further esophageal w/u if symptoms persist. SLP Visit Diagnosis: Dysphagia, unspecified (R13.10)    Aspiration Risk  Mild aspiration risk    Diet Recommendation Regular;Thin liquid   Liquid Administration via: Cup;Straw Medication Administration: Whole meds with puree Supervision: Patient able to self feed;Intermittent supervision to cue for compensatory strategies Compensations: Slow rate;Small sips/bites;Follow solids with liquid Postural Changes: Seated upright at 90 degrees;Remain upright for at least 30 minutes after po intake    Other  Recommendations Recommended Consults: Consider esophageal assessment Oral Care Recommendations: Oral care BID   Follow up Recommendations None      Frequency and Duration            Prognosis  Swallow Study   General HPI: Pt is an 77 y.o. female with medical history significant for HTN, DM, CKD 3, diastolic dysfunction, CVA, PNA, memory loss, HH, and anxiety who presents with orthostatic hypotension while working with PT. CXR negative for acute infection, but she did report difficulty swallowing certain foods and pills.  Type of Study: Bedside Swallow Evaluation Previous Swallow Assessment: none in chart - says she was evaluated in [place] after a stroke but that she  Diet Prior to this Study: Regular;Thin liquids Temperature Spikes Noted: No Respiratory Status: Room air History of Recent Intubation: No Behavior/Cognition: Alert;Cooperative;Pleasant mood Oral Cavity Assessment: Within Functional Limits Oral Care Completed by SLP: No Oral Cavity - Dentition: Dentures, top;Dentures, bottom Vision: Functional for self-feeding Self-Feeding Abilities: Able to feed self Patient Positioning: Upright in bed Baseline Vocal Quality: Normal    Oral/Motor/Sensory Function Overall Oral  Motor/Sensory Function: Within functional limits   Ice Chips Ice chips: Not tested   Thin Liquid Thin Liquid: Within functional limits Presentation: Cup;Self Fed    Nectar Thick Nectar Thick Liquid: Not tested   Honey Thick Honey Thick Liquid: Not tested   Puree Puree: Within functional limits Presentation: Self Fed;Spoon   Solid     Solid: Within functional limits Presentation: Self Tara Jackson, Tara Jackson 09/14/2017,10:35 AM  Tara Jackson, M.A. CCC-SLP [phone]

## 2017-09-14 NOTE — Discharge Summary (Addendum)
Physician Discharge Summary  Tara Jackson ACZ:[id number] DOB: [birth year] DOA: 09/12/2017  PCP: Shon Baton, MD  Admit date: 09/12/2017 Discharge date: 09/14/2017  Admitted From: home Discharge disposition: home   Recommendations for Outpatient Follow-Up:   1. Resume home health-- needs to increase activity 2. TED hose 3. Have held ARB and made lasix PRN    Discharge Diagnosis:   Active Problems:   Essential hypertension   Diastolic dysfunction, left ventricle   Type 2 diabetes mellitus with renal manifestations (HCC)   CKD stage 3 due to type 2 diabetes mellitus ([place])   Hypotension    Discharge Condition: Improved.  Diet recommendation: Low sodium, heart healthy.  Carbohydrate-modified  Wound care: None.  Code status: Full.   History of Present Illness:   Tara Jackson is a 77 y.o. female with medical history significant for HTN, DM, CKD 3, diastolic dysfunction.  Today patient was working with physical therapy, when patient was standing up, patient's blood pressure dropped from 155/77-85/45 and 90/52.  She felt dizzy.  Patient reports chest pain here in the ED, none prior to arrival.  Reports central chest pain radiating to her back, described as a tightness, started when she was lying down and self resolved without intervention.  Reports chronic unchanged shortness of breath on home 2 L mostly at night.  No associated nausea or diaphoresis or palpitations. No cough.  Reports intermittent subjective fevers at night for several weeks. Patient reports dysuria of 2 days.  No abdominal pain no nausea vomiting, reports 4 days of loose stools a week ago, she has maintained normal p.o. Intake.  Takes Lasix 20 mg daily. Patient denies NSAID use, denies blood in stools or black stools.  ED Course: Blood pressure down to 89/55 improved with 1 L bolus in ED, but still soft.  Hemoglobin 8.9 down from 11.3, 47-month ago.  Creatinine elevated but at baseline 1.8.  UA clean.   Lactic acid mildly elevated 1.9 >1.3.  EKG sinus rhythm prolonged QTC 649. two-view chest x-ray no acute abnormality.  Blood cultures were obtained.  Hospitalist was called to admit for hypotension and anemia.   Hospital Course by Problem:   Orthostatic hypotension- -per chart review by me this appears to be an on-going issue with near syncope/orthostatic hypotension-- seen by Dr. Ellyn Hack and BP meds/lasix was adjusted Pacemaker interrogated in the ED-patient is in sinus rhythm  -Hydrate normal saline 100 cc/h with good response -d/c irbesartan and make lasix PRN -repeat orthos improved with no symptoms -only occasionally wears TED hose-- encouraged regular use -ambulated in hallway with ambulatory specialist, [place] and a walker-- steady  Chest painin ED-atypical.EKG without ST or T wave abnormalities, similar to prior.History of abnormal nuclear stress test with normal coronaries per cardiology notes 06/2017 -Continue Plavix -CE negative  Acute anemia-hemoglobin8.9,baseline 10-11.Last check 11.3 one-month ago.Stool occult negative X 1. -h/h stable  Prolonged QTC- 649. k- 4.3.Magnesium 1.8. -PRN monitoring -replace electrolytes  DM- 02/2017- hgba1c- 7.9. -defer changes to PCP  History of diastolic dysfunction, pace makerstatus-appears stable. -Follows with cardiology Dr. Ellyn Hack.  -06/2017- With complaints of orthostatic hypotension medication doses were reduced,pacemaker was interrogated with no events.Last echo 2018 EF 65 to 70% mild LVH.  Anxiety- -continue home Klonopin and buspirone, nortriptyline  CKD3- -? Baseline1.7? Stable after IVF GFR 33  Patient c/o difficultly swallowing food as well as pill Voice appears muffled some-- SLP eval done-- defer any further work up to PCP    Medical Consultants:  Discharge Exam:   Vitals:   09/14/17 0322 09/14/17 1207  BP: (!) 138/49 (!) 121/48  Pulse: (!) 59 60  Resp: 18 20  Temp:  98.2 F (36.8 C) 97.8 F (36.6 C)  SpO2: 97% 100%   Vitals:   09/13/17 2157 09/13/17 2328 09/14/17 0322 09/14/17 1207  BP: (!) 143/50 (!) 142/60 (!) 138/49 (!) 121/48  Pulse:  61 (!) 59 60  Resp:  18 18 20   Temp:  98.2 F (36.8 C) 98.2 F (36.8 C) 97.8 F (36.6 C)  TempSrc:  Oral Oral Oral  SpO2:  97% 97% 100%  Weight:   120.5 kg   Height:   5\' 7"  (1.702 m)     General exam: Appears calm and comfortable.     The results of significant diagnostics from this hospitalization (including imaging, microbiology, ancillary and laboratory) are listed below for reference.     Procedures and Diagnostic Studies:   Dg Chest 2 View  Result Date: 09/12/2017 CLINICAL DATA:  Cough.  Chills.  Hypotension. EXAM: CHEST - 2 VIEW COMPARISON:  08/23/2017. FINDINGS: Cardiac pacer with lead tip over the right atrium and right ventricle. Cardiomegaly with normal pulmonary vascularity. Lungs are clear. No pleural effusion or pneumothorax. No acute bony abnormality. Degenerative change thoracic spine. IMPRESSION: 1. Cardiac pacer with lead tips over the right atrium right ventricle. Cardiomegaly. 2.  No acute pulmonary disease. Electronically Signed   By: Marcello Moores  Register   On: 09/12/2017 12:44     Labs:   Basic Metabolic Panel: Recent Labs  Lab 09/12/17 1215 09/13/17 0704 09/14/17 0421  NA 136 137 140  K 4.3 3.9 4.0  CL 98 102 104  CO2 28 26 24   GLUCOSE 237* 136* 239*  BUN 23 21 19   CREATININE 1.88* 1.66* 1.66*  CALCIUM 9.2 8.2* 8.5*  MG 1.8  --   --    GFR Estimated Creatinine Clearance: 37.6 mL/min (A) (by C-G formula based on SCr of 1.66 mg/dL (H)). Liver Function Tests: Recent Labs  Lab 09/12/17 1215  AST 22  ALT 12  ALKPHOS 66  BILITOT 0.6  PROT 6.4*  ALBUMIN 3.4*   No results for input(s): LIPASE, AMYLASE in the last 168 hours. No results for input(s): AMMONIA in the last 168 hours. Coagulation profile No results for input(s): INR, PROTIME in the last 168  hours.  CBC: Recent Labs  Lab 09/12/17 1215 09/13/17 0704 09/14/17 0421  WBC 9.2 5.8 5.2  NEUTROABS 6.3  --   --   HGB 8.9* 9.8* 9.5*  HCT 27.8* 30.6* 30.2*  MCV 100.4* 100.0 101.0*  PLT 290 213 207   Cardiac Enzymes: Recent Labs  Lab 09/12/17 2300 09/13/17 0704  TROPONINI <0.03 <0.03   BNP: Invalid input(s): POCBNP CBG: Recent Labs  Lab 09/13/17 1110 09/13/17 1634 09/13/17 2118 09/14/17 0748 09/14/17 1117  GLUCAP 164* 218* 234* 233* 263*   D-Dimer No results for input(s): DDIMER in the last 72 hours. Hgb A1c No results for input(s): HGBA1C in the last 72 hours. Lipid Profile No results for input(s): CHOL, HDL, LDLCALC, TRIG, CHOLHDL, LDLDIRECT in the last 72 hours. Thyroid function studies Recent Labs    09/12/17 1215  TSH 4.194   Anemia work up Recent Labs    09/13/17 0457 09/13/17 0704  VITAMINB12  --  282  FOLATE 17.3  --   FERRITIN  --  175  TIBC  --  312  IRON  --  53  RETICCTPCT  --  1.7  Microbiology Recent Results (from the past 240 hour(s))  Blood culture (routine x 2)     Status: None (Preliminary result)   Collection Time: 09/12/17 12:14 PM  Result Value Ref Range Status   Specimen Description BLOOD RIGHT ARM  Final   Special Requests   Final    BOTTLES DRAWN AEROBIC AND ANAEROBIC Blood Culture adequate volume   Culture   Final    NO GROWTH 2 DAYS Performed at [facility], [address] [place], [place] [address]    Report Status PENDING  Incomplete  Urine culture     Status: Abnormal   Collection Time: 09/12/17 12:15 PM  Result Value Ref Range Status   Specimen Description URINE, RANDOM  Final   Special Requests NONE  Final   Culture (A)  Final    <10,000 COLONIES/mL INSIGNIFICANT GROWTH Performed at [facility], [place] [address], [place], [place] [address]    Report Status 09/13/2017 FINAL  Final  Blood culture (routine x 2)     Status: None (Preliminary result)   Collection Time: 09/13/17  4:55 AM  Result  Value Ref Range Status   Specimen Description BLOOD RIGHT HAND  Final   Special Requests   Final    BOTTLES DRAWN AEROBIC ONLY Blood Culture results may not be optimal due to an inadequate volume of blood received in culture bottles   Culture   Final    NO GROWTH 1 DAY Performed at [facility], [place] [address], [place], [place] [address]    Report Status PENDING  Incomplete     Discharge Instructions:   Discharge Instructions    Diet - low sodium heart healthy   Complete by:  As directed    Discharge instructions   Complete by:  As directed    Resume home health TED hose Get up slowly to allow your BP to stabilize   Increase activity slowly   Complete by:  As directed      Allergies as of 09/14/2017      Reactions   Iodine Other (See Comments)   ONLY IV--Makes unconscious   Iohexol Other (See Comments)    Code: SOB, Desc: cardiac arrest w/ iv contrast, has never used 13 hr prep//alice calhoun, Onset Date: [id number]   Metoclopramide Hcl Other (See Comments)   suicidal   Sulfa Antibiotics Hives   Lactose Intolerance (gi) Diarrhea   Lansoprazole Nausea And Vomiting      Medication List    STOP taking these medications   irbesartan 150 MG tablet Commonly known as:  AVAPRO     TAKE these medications   acetaminophen 500 MG tablet Commonly known as:  TYLENOL Take 2 tablets (1,000 mg total) by mouth every 6 (six) hours as needed.   albuterol 108 (90 Base) MCG/ACT inhaler Commonly known as:  PROVENTIL HFA;VENTOLIN HFA Inhale 2 puffs into the lungs every 6 (six) hours as needed for wheezing.   bisacodyl 10 MG/30ML Enem Commonly known as:  FLEET Place 10 mg rectally once.   busPIRone 10 MG tablet Commonly known as:  BUSPAR Take 1 tablet (10 mg total) by mouth every morning.   clonazePAM 0.5 MG tablet Commonly known as:  KLONOPIN Take 0.5-1 mg by mouth See admin instructions. Takes 2 tabs in am and 1 tab in pm.   clopidogrel 75 MG tablet Commonly known as:   PLAVIX Take 75 mg by mouth every morning.   dicyclomine 10 MG capsule Commonly known as:  BENTYL Take 1 capsule (10 mg total) by mouth 3 (three) times daily before meals.   furosemide 20 MG tablet Commonly known as:  LASIX Take 1 tablet (20 mg total) by mouth daily as needed for fluid or edema. What changed:    when to take this  reasons to take this   glipiZIDE 10 MG tablet Commonly known as:  GLUCOTROL Take 20 mg by mouth 2 (two) times daily before a meal.   guaiFENesin 600 MG 12 hr tablet Commonly known as:  MUCINEX Take 600 mg by mouth 2 (two) times daily as needed for cough.   insulin NPH Human 100 UNIT/ML injection Commonly known as:  HUMULIN N,NOVOLIN N Inject 0-20 Units into the skin See admin instructions. Novolin-N Give 20 unit in morning; give  5 units at 4pm; evening dose as needed.  If less than 150 blood sugar= 0units.  If greater than 200 blood sugar= give 10units to decrease by 100 blood sugar   insulin regular 100 units/mL injection Commonly known as:  NOVOLIN R,HUMULIN R Inject 0-25 Units into the skin See admin instructions. Give 25 unit in morning; give 20units at 4pm; evening dose as needed.  If less than 150 blood sugar= 0units.  If greater than 200 blood sugar= give 10units to decrease by 100 blood sugar   ipratropium 0.03 % nasal spray Commonly known as:  ATROVENT Place 2 sprays into both nostrils every 12 (twelve) hours. What changed:    when to take this  reasons to take this   levothyroxine 100 MCG tablet Commonly known as:  SYNTHROID, LEVOTHROID Take 100 mcg by mouth every morning.   lidocaine 5 % Commonly known as:  LIDODERM Place 3 patches onto the skin daily as needed (pain). Remove & Discard patch within 12 hours or as directed by MD   linaclotide 290 MCG Caps capsule Commonly known as:  LINZESS Take 290 mcg by mouth daily before breakfast.   LORazepam 0.5 MG tablet Commonly known as:  ATIVAN Take 0.5 mg by mouth daily as needed  for anxiety.   magnesium 30 MG tablet Take 30 mg by mouth daily.   nortriptyline 50 MG capsule Commonly known as:  PAMELOR TAKE 2 CAPSULES (100MG ) AT BEDTIME What changed:  See the new instructions.   ondansetron 4 MG tablet Commonly known as:  ZOFRAN Take 4 mg by mouth as needed.   OXYGEN Inhale 2 L into the lungs as needed. Used at overnight with a concentrator   polyethylene glycol packet Commonly known as:  MIRALAX / GLYCOLAX Take 17 g by mouth daily as needed for mild constipation.   potassium chloride SA 20 MEQ tablet Commonly known as:  K-DUR,KLOR-CON Take 1 tablet (20 mEq total) by mouth 2 (two) times daily.   rosuvastatin 20 MG tablet Commonly known as:  CRESTOR Take 20 mg by mouth daily.   solifenacin 10 MG tablet Commonly known as:  VESICARE Take 10 mg by mouth daily as needed (overactive bladder).   terconazole 0.4 % vaginal cream Commonly known as:  TERAZOL 7 Place 1 applicator vaginally at bedtime as needed (for yeast infection).   traMADol 50 MG tablet Commonly known as:  ULTRAM Take 1 tablet (50 mg total) by mouth every 12 (twelve) hours as needed for moderate pain. What changed:    when to take this  Another medication with the same name was removed. Continue taking this medication, and follow the directions you see here.   VICKS FORMULA 44 COUGH/COLD PM  PO Take by mouth. Used 1/2 cup   vitamin B-12 500 MCG tablet Commonly known as:  CYANOCOBALAMIN Take 1 tablet (500 mcg total) by mouth daily. Start taking on:  09/15/2017   VITAMIN D-3 PO Take 2 tablets by mouth every morning.      Follow-up Information    Shon Baton, MD. Schedule an appointment as soon as possible for a visit in 4 week(s).   Specialty:  Internal Medicine Contact information: [place] [address] [phone]        Leonie Man, MD .   Specialty:  Cardiology Contact information: [address] [place]  [address] [phone]            Time coordinating discharge: 25 min  Signed:  Geradine Girt  [facility] 09/14/2017, 3:39 PM

## 2017-09-14 NOTE — Progress Notes (Signed)
Piv dcd. Site unremarkable. Pressure dressing intact. Cath intact upon removal. DC instructions reviewed with patient and spouse. Both verbalized understanding. Belongings given to patient. Education on disease/patient condition provided by primary RN Mikle Bosworth.

## 2017-09-14 NOTE — Progress Notes (Signed)
Chaplain provided a Advance Directive for the patient, she reports she might be discharged on today, Chaplain advised her to call the [facility] for assistance if needed. Chaplain Yaakov Guthrie [phone]

## 2017-09-15 DIAGNOSIS — E1149 Type 2 diabetes mellitus with other diabetic neurological complication: Secondary | ICD-10-CM | POA: Diagnosis not present

## 2017-09-15 DIAGNOSIS — Z9981 Dependence on supplemental oxygen: Secondary | ICD-10-CM | POA: Diagnosis not present

## 2017-09-15 DIAGNOSIS — R69 Illness, unspecified: Secondary | ICD-10-CM | POA: Diagnosis not present

## 2017-09-15 DIAGNOSIS — I951 Orthostatic hypotension: Secondary | ICD-10-CM | POA: Diagnosis not present

## 2017-09-15 DIAGNOSIS — D6489 Other specified anemias: Secondary | ICD-10-CM | POA: Diagnosis not present

## 2017-09-15 DIAGNOSIS — I519 Heart disease, unspecified: Secondary | ICD-10-CM | POA: Diagnosis not present

## 2017-09-15 DIAGNOSIS — N183 Chronic kidney disease, stage 3 (moderate): Secondary | ICD-10-CM | POA: Diagnosis not present

## 2017-09-15 DIAGNOSIS — I4581 Long QT syndrome: Secondary | ICD-10-CM | POA: Diagnosis not present

## 2017-09-15 DIAGNOSIS — R1310 Dysphagia, unspecified: Secondary | ICD-10-CM | POA: Diagnosis not present

## 2017-09-15 DIAGNOSIS — R0789 Other chest pain: Secondary | ICD-10-CM | POA: Diagnosis not present

## 2017-09-15 NOTE — Progress Notes (Signed)
Remote pacemaker transmission.   

## 2017-09-17 LAB — CULTURE, BLOOD (ROUTINE X 2)
Culture: NO GROWTH
Special Requests: ADEQUATE

## 2017-09-18 LAB — CULTURE, BLOOD (ROUTINE X 2): Culture: NO GROWTH

## 2017-09-19 DIAGNOSIS — M48061 Spinal stenosis, lumbar region without neurogenic claudication: Secondary | ICD-10-CM | POA: Diagnosis not present

## 2017-09-19 DIAGNOSIS — I130 Hypertensive heart and chronic kidney disease with heart failure and stage 1 through stage 4 chronic kidney disease, or unspecified chronic kidney disease: Secondary | ICD-10-CM | POA: Diagnosis not present

## 2017-09-19 DIAGNOSIS — E1122 Type 2 diabetes mellitus with diabetic chronic kidney disease: Secondary | ICD-10-CM | POA: Diagnosis not present

## 2017-09-19 DIAGNOSIS — I5042 Chronic combined systolic (congestive) and diastolic (congestive) heart failure: Secondary | ICD-10-CM | POA: Diagnosis not present

## 2017-09-19 DIAGNOSIS — M47812 Spondylosis without myelopathy or radiculopathy, cervical region: Secondary | ICD-10-CM | POA: Diagnosis not present

## 2017-09-21 DIAGNOSIS — I509 Heart failure, unspecified: Secondary | ICD-10-CM | POA: Diagnosis not present

## 2017-09-21 DIAGNOSIS — I10 Essential (primary) hypertension: Secondary | ICD-10-CM | POA: Diagnosis not present

## 2017-09-21 DIAGNOSIS — M47812 Spondylosis without myelopathy or radiculopathy, cervical region: Secondary | ICD-10-CM | POA: Diagnosis not present

## 2017-09-21 DIAGNOSIS — I130 Hypertensive heart and chronic kidney disease with heart failure and stage 1 through stage 4 chronic kidney disease, or unspecified chronic kidney disease: Secondary | ICD-10-CM | POA: Diagnosis not present

## 2017-09-21 DIAGNOSIS — I5042 Chronic combined systolic (congestive) and diastolic (congestive) heart failure: Secondary | ICD-10-CM | POA: Diagnosis not present

## 2017-09-21 DIAGNOSIS — E1122 Type 2 diabetes mellitus with diabetic chronic kidney disease: Secondary | ICD-10-CM | POA: Diagnosis not present

## 2017-09-21 DIAGNOSIS — M48061 Spinal stenosis, lumbar region without neurogenic claudication: Secondary | ICD-10-CM | POA: Diagnosis not present

## 2017-09-22 DIAGNOSIS — M47812 Spondylosis without myelopathy or radiculopathy, cervical region: Secondary | ICD-10-CM | POA: Diagnosis not present

## 2017-09-22 DIAGNOSIS — E1122 Type 2 diabetes mellitus with diabetic chronic kidney disease: Secondary | ICD-10-CM | POA: Diagnosis not present

## 2017-09-22 DIAGNOSIS — I130 Hypertensive heart and chronic kidney disease with heart failure and stage 1 through stage 4 chronic kidney disease, or unspecified chronic kidney disease: Secondary | ICD-10-CM | POA: Diagnosis not present

## 2017-09-22 DIAGNOSIS — M48061 Spinal stenosis, lumbar region without neurogenic claudication: Secondary | ICD-10-CM | POA: Diagnosis not present

## 2017-09-22 DIAGNOSIS — I5042 Chronic combined systolic (congestive) and diastolic (congestive) heart failure: Secondary | ICD-10-CM | POA: Diagnosis not present

## 2017-09-25 DIAGNOSIS — H04123 Dry eye syndrome of bilateral lacrimal glands: Secondary | ICD-10-CM | POA: Diagnosis not present

## 2017-09-25 DIAGNOSIS — H401111 Primary open-angle glaucoma, right eye, mild stage: Secondary | ICD-10-CM | POA: Diagnosis not present

## 2017-09-25 DIAGNOSIS — H401121 Primary open-angle glaucoma, left eye, mild stage: Secondary | ICD-10-CM | POA: Diagnosis not present

## 2017-09-25 DIAGNOSIS — H04213 Epiphora due to excess lacrimation, bilateral lacrimal glands: Secondary | ICD-10-CM | POA: Diagnosis not present

## 2017-09-27 DIAGNOSIS — I5042 Chronic combined systolic (congestive) and diastolic (congestive) heart failure: Secondary | ICD-10-CM | POA: Diagnosis not present

## 2017-09-27 DIAGNOSIS — E1122 Type 2 diabetes mellitus with diabetic chronic kidney disease: Secondary | ICD-10-CM | POA: Diagnosis not present

## 2017-09-27 DIAGNOSIS — I130 Hypertensive heart and chronic kidney disease with heart failure and stage 1 through stage 4 chronic kidney disease, or unspecified chronic kidney disease: Secondary | ICD-10-CM | POA: Diagnosis not present

## 2017-09-27 DIAGNOSIS — M47812 Spondylosis without myelopathy or radiculopathy, cervical region: Secondary | ICD-10-CM | POA: Diagnosis not present

## 2017-09-27 DIAGNOSIS — M48061 Spinal stenosis, lumbar region without neurogenic claudication: Secondary | ICD-10-CM | POA: Diagnosis not present

## 2017-09-29 DIAGNOSIS — M48061 Spinal stenosis, lumbar region without neurogenic claudication: Secondary | ICD-10-CM | POA: Diagnosis not present

## 2017-09-29 DIAGNOSIS — I130 Hypertensive heart and chronic kidney disease with heart failure and stage 1 through stage 4 chronic kidney disease, or unspecified chronic kidney disease: Secondary | ICD-10-CM | POA: Diagnosis not present

## 2017-09-29 DIAGNOSIS — I5042 Chronic combined systolic (congestive) and diastolic (congestive) heart failure: Secondary | ICD-10-CM | POA: Diagnosis not present

## 2017-09-29 DIAGNOSIS — E1122 Type 2 diabetes mellitus with diabetic chronic kidney disease: Secondary | ICD-10-CM | POA: Diagnosis not present

## 2017-09-29 DIAGNOSIS — M47812 Spondylosis without myelopathy or radiculopathy, cervical region: Secondary | ICD-10-CM | POA: Diagnosis not present

## 2017-10-03 DIAGNOSIS — I5042 Chronic combined systolic (congestive) and diastolic (congestive) heart failure: Secondary | ICD-10-CM | POA: Diagnosis not present

## 2017-10-03 DIAGNOSIS — I130 Hypertensive heart and chronic kidney disease with heart failure and stage 1 through stage 4 chronic kidney disease, or unspecified chronic kidney disease: Secondary | ICD-10-CM | POA: Diagnosis not present

## 2017-10-03 DIAGNOSIS — E1122 Type 2 diabetes mellitus with diabetic chronic kidney disease: Secondary | ICD-10-CM | POA: Diagnosis not present

## 2017-10-03 DIAGNOSIS — M47812 Spondylosis without myelopathy or radiculopathy, cervical region: Secondary | ICD-10-CM | POA: Diagnosis not present

## 2017-10-03 DIAGNOSIS — M48061 Spinal stenosis, lumbar region without neurogenic claudication: Secondary | ICD-10-CM | POA: Diagnosis not present

## 2017-10-04 LAB — CUP PACEART REMOTE DEVICE CHECK
Battery Remaining Longevity: 138 mo
Battery Remaining Percentage: 100 %
Brady Statistic RA Percent Paced: 68 %
Brady Statistic RV Percent Paced: 37 %
Date Time Interrogation Session: 20190806165900
Implantable Lead Implant Date: 20170214
Implantable Lead Implant Date: 20170214
Implantable Lead Location: 753859
Implantable Lead Location: 753860
Implantable Lead Model: 7740
Implantable Lead Model: 7741
Implantable Lead Serial Number: 649859
Implantable Lead Serial Number: 728741
Implantable Pulse Generator Implant Date: 20170214
Lead Channel Impedance Value: 708 Ohm
Lead Channel Impedance Value: 773 Ohm
Lead Channel Pacing Threshold Amplitude: 0.5 V
Lead Channel Pacing Threshold Amplitude: 1.3 V
Lead Channel Pacing Threshold Pulse Width: 0.4 ms
Lead Channel Pacing Threshold Pulse Width: 0.4 ms
Lead Channel Setting Pacing Amplitude: 1.8 V
Lead Channel Setting Pacing Amplitude: 2 V
Lead Channel Setting Pacing Pulse Width: 0.4 ms
Lead Channel Setting Sensing Sensitivity: 2.5 mV
Pulse Gen Serial Number: 718098

## 2017-10-05 DIAGNOSIS — M47812 Spondylosis without myelopathy or radiculopathy, cervical region: Secondary | ICD-10-CM | POA: Diagnosis not present

## 2017-10-05 DIAGNOSIS — M48061 Spinal stenosis, lumbar region without neurogenic claudication: Secondary | ICD-10-CM | POA: Diagnosis not present

## 2017-10-05 DIAGNOSIS — I130 Hypertensive heart and chronic kidney disease with heart failure and stage 1 through stage 4 chronic kidney disease, or unspecified chronic kidney disease: Secondary | ICD-10-CM | POA: Diagnosis not present

## 2017-10-05 DIAGNOSIS — I5042 Chronic combined systolic (congestive) and diastolic (congestive) heart failure: Secondary | ICD-10-CM | POA: Diagnosis not present

## 2017-10-05 DIAGNOSIS — E1122 Type 2 diabetes mellitus with diabetic chronic kidney disease: Secondary | ICD-10-CM | POA: Diagnosis not present

## 2017-10-11 DIAGNOSIS — I5042 Chronic combined systolic (congestive) and diastolic (congestive) heart failure: Secondary | ICD-10-CM | POA: Diagnosis not present

## 2017-10-11 DIAGNOSIS — I130 Hypertensive heart and chronic kidney disease with heart failure and stage 1 through stage 4 chronic kidney disease, or unspecified chronic kidney disease: Secondary | ICD-10-CM | POA: Diagnosis not present

## 2017-10-11 DIAGNOSIS — M47812 Spondylosis without myelopathy or radiculopathy, cervical region: Secondary | ICD-10-CM | POA: Diagnosis not present

## 2017-10-11 DIAGNOSIS — M48061 Spinal stenosis, lumbar region without neurogenic claudication: Secondary | ICD-10-CM | POA: Diagnosis not present

## 2017-10-11 DIAGNOSIS — E1122 Type 2 diabetes mellitus with diabetic chronic kidney disease: Secondary | ICD-10-CM | POA: Diagnosis not present

## 2017-10-13 DIAGNOSIS — M47812 Spondylosis without myelopathy or radiculopathy, cervical region: Secondary | ICD-10-CM | POA: Diagnosis not present

## 2017-10-13 DIAGNOSIS — I5042 Chronic combined systolic (congestive) and diastolic (congestive) heart failure: Secondary | ICD-10-CM | POA: Diagnosis not present

## 2017-10-13 DIAGNOSIS — E1122 Type 2 diabetes mellitus with diabetic chronic kidney disease: Secondary | ICD-10-CM | POA: Diagnosis not present

## 2017-10-13 DIAGNOSIS — M48061 Spinal stenosis, lumbar region without neurogenic claudication: Secondary | ICD-10-CM | POA: Diagnosis not present

## 2017-10-13 DIAGNOSIS — I130 Hypertensive heart and chronic kidney disease with heart failure and stage 1 through stage 4 chronic kidney disease, or unspecified chronic kidney disease: Secondary | ICD-10-CM | POA: Diagnosis not present

## 2017-10-17 DIAGNOSIS — M48061 Spinal stenosis, lumbar region without neurogenic claudication: Secondary | ICD-10-CM | POA: Diagnosis not present

## 2017-10-17 DIAGNOSIS — I130 Hypertensive heart and chronic kidney disease with heart failure and stage 1 through stage 4 chronic kidney disease, or unspecified chronic kidney disease: Secondary | ICD-10-CM | POA: Diagnosis not present

## 2017-10-17 DIAGNOSIS — E1140 Type 2 diabetes mellitus with diabetic neuropathy, unspecified: Secondary | ICD-10-CM | POA: Diagnosis not present

## 2017-10-17 DIAGNOSIS — Z6841 Body Mass Index (BMI) 40.0 and over, adult: Secondary | ICD-10-CM | POA: Diagnosis not present

## 2017-10-17 DIAGNOSIS — I951 Orthostatic hypotension: Secondary | ICD-10-CM | POA: Diagnosis not present

## 2017-10-17 DIAGNOSIS — I509 Heart failure, unspecified: Secondary | ICD-10-CM | POA: Diagnosis not present

## 2017-10-17 DIAGNOSIS — Z794 Long term (current) use of insulin: Secondary | ICD-10-CM | POA: Diagnosis not present

## 2017-10-17 DIAGNOSIS — E1122 Type 2 diabetes mellitus with diabetic chronic kidney disease: Secondary | ICD-10-CM | POA: Diagnosis not present

## 2017-10-17 DIAGNOSIS — I129 Hypertensive chronic kidney disease with stage 1 through stage 4 chronic kidney disease, or unspecified chronic kidney disease: Secondary | ICD-10-CM | POA: Diagnosis not present

## 2017-10-17 DIAGNOSIS — R69 Illness, unspecified: Secondary | ICD-10-CM | POA: Diagnosis not present

## 2017-10-17 DIAGNOSIS — M47812 Spondylosis without myelopathy or radiculopathy, cervical region: Secondary | ICD-10-CM | POA: Diagnosis not present

## 2017-10-17 DIAGNOSIS — Z9981 Dependence on supplemental oxygen: Secondary | ICD-10-CM | POA: Diagnosis not present

## 2017-10-17 DIAGNOSIS — I519 Heart disease, unspecified: Secondary | ICD-10-CM | POA: Diagnosis not present

## 2017-10-17 DIAGNOSIS — I5042 Chronic combined systolic (congestive) and diastolic (congestive) heart failure: Secondary | ICD-10-CM | POA: Diagnosis not present

## 2017-10-19 ENCOUNTER — Ambulatory Visit
Admission: RE | Admit: 2017-10-19 | Discharge: 2017-10-19 | Disposition: A | Discharge status: Home / Self Care | Payer: Medicare HMO | Source: Ambulatory Visit | Attending: Internal Medicine | Admitting: Internal Medicine

## 2017-10-19 ENCOUNTER — Encounter: Payer: Self-pay | Admitting: Cardiology

## 2017-10-19 ENCOUNTER — Ambulatory Visit: Payer: Medicare HMO | Admitting: Cardiology

## 2017-10-19 ENCOUNTER — Other Ambulatory Visit: Payer: Self-pay | Admitting: Internal Medicine

## 2017-10-19 VITALS — BP 137/68 | HR 60 | Ht 67.5 in | Wt 260.0 lb

## 2017-10-19 DIAGNOSIS — R0609 Other forms of dyspnea: Secondary | ICD-10-CM | POA: Diagnosis not present

## 2017-10-19 DIAGNOSIS — I5032 Chronic diastolic (congestive) heart failure: Secondary | ICD-10-CM | POA: Diagnosis not present

## 2017-10-19 DIAGNOSIS — I951 Orthostatic hypotension: Secondary | ICD-10-CM | POA: Diagnosis not present

## 2017-10-19 DIAGNOSIS — I10 Essential (primary) hypertension: Secondary | ICD-10-CM | POA: Diagnosis not present

## 2017-10-19 DIAGNOSIS — R55 Syncope and collapse: Secondary | ICD-10-CM

## 2017-10-19 DIAGNOSIS — R600 Localized edema: Secondary | ICD-10-CM

## 2017-10-19 DIAGNOSIS — R42 Dizziness and giddiness: Secondary | ICD-10-CM | POA: Diagnosis not present

## 2017-10-19 DIAGNOSIS — R0600 Dyspnea, unspecified: Secondary | ICD-10-CM

## 2017-10-19 DIAGNOSIS — R0602 Shortness of breath: Secondary | ICD-10-CM

## 2017-10-19 MED ORDER — MIDODRINE HCL 2.5 MG PO TABS: 2.5000 mg | ORAL_TABLET | Freq: Three times a day (TID) | ORAL | 6 refills | Status: DC

## 2017-10-19 NOTE — Progress Notes (Signed)
PCP: Shon Baton, MD  Clinic Note: Chief Complaint  Patient presents with  . Hospitalization Follow-up    multiple falls/near syncope  . Shortness of Breath    activity  . Chest Pain    @ nignht    HPI: Tara Jackson is a 77 y.o. female with a PMH notable for chronic PEs (on long-standing Plavix after prolonged warfarin), symptomatic bradycardia status- post pacemaker as well as significant with static hypotension who presents for today for 5-month/hospital follow-up.    Other than for bradycardia, she has been seen for various complaints of shortness of breath and intermittent chest pain with relatively normal cardiac evaluations.   She has had a history of abnormal nuclear stress test with normal coronaries on cath.  Tara Jackson was last seen on May 2019.  Noted persistent orthostatic dizziness with near syncope and falling spells..  Recent Hospitalizations:   ER visit Jun 12, 2017 for right leg pain --> no evidence of DVT.  ER visit August 04, 2016: Near syncope -> with sudden fall.  No precipitating palpitations or other symptoms.  PPM interrogation revealed no arrhythmia.  Negative head CT.  ER visit August 23, 2017: Fall x 2.  Unable to get up off the floor after a second fall.  Again CT head without acute findings.  No fractures.  ER visit-hospitalization September 12, 2017 -> finally last antihypertensive agent was held and Lasix was made PRN.  Was told to wear TED hose.  Was found to have some renal insufficiency has not been followed up at [facility]  (peak creatinine was roughly 3.5) -->  Lab Results  Component Value Date   CREATININE 1.66 (H) 09/14/2017   CREATININE 1.66 (H) 09/13/2017   CREATININE 1.88 (H) 09/12/2017    Studies Personally Reviewed - (if available, images/films reviewed: From Epic Chart or Care Everywhere)  Lower Extremity Venous Dopplers May 2019: No DVT or evidence of significant superficial venous thrombosis.  Interval History:  Tara Jackson may presents today on no BP medications or diuretic for hospital follow-up status post multiple ER visits for falls --falls occur while standing, while walking, not necessarily associated with just standing up..  This is again a recurrent theme for [name] where she has been having significant amount of falling episodes but still has edema now but she is off of her Lasix.  She is just using it as needed and is scared to use it because of her falls.  She has pretty significant diabetic neuropathy, and has pretty significant bilateral leg and hip weakness that could be one the reason for her falls, but it does sound like her symptoms are related to orthostatic hypotension.  Unfortunately, they have not necessarily improved by backing off on her antihypertensives.  Despite the fact that she is lost about 50 pounds, she still notes having exertional dyspnea, most notably when she is doing her physical therapy.  She is quick to point out that this is probably more exercise than she had been usually doing as per her previous habits.  She thinks it may just be related to deconditioning, but is concerned because of how it may be limiting her.  She still has her sharp pinching type symptoms in her chest that happen at night when she lies down, but not necessarily with any activity.  She is still using nighttime oxygen as opposed to CPAP, and sleeps with 2 pillows but denies any orthopnea or PND.  Edema is present but not overly  significant.  With her near syncopal episodes, she is not noticing any rapid irregular heartbeats or palpitations.  She has not lost consciousness. She also denies any symptoms consistent with TIA or amaurosis fugax.  While she feels her heart rate may be going up sometimes it does not seem to be routine and not associated with her near-pass out spells  No claudication  ROS: A comprehensive was performed. Review of Systems  Constitutional: Positive for malaise/fatigue. Negative  for chills and fever.  HENT: Negative for congestion.   Respiratory: Positive for cough and shortness of breath. Negative for sputum production and wheezing.   Gastrointestinal: Negative for abdominal pain, blood in stool, constipation, heartburn, melena and nausea.       Poor appetite, but drinks plenty  Genitourinary: Positive for urgency. Negative for dysuria and hematuria.  Musculoskeletal: Positive for back pain (This is really the most limiting feature.), falls and joint pain.  Neurological: Positive for dizziness. Negative for focal weakness and loss of consciousness (Per HPI).  Psychiatric/Behavioral: Negative for depression and memory loss (Does not stay asleep well.). The patient is nervous/anxious. The patient does not have insomnia.   All other systems reviewed and are negative.  I have reviewed and (if needed) personally updated the patient's problem list, medications, allergies, past medical and surgical history, social and family history.   Past Medical History:  Diagnosis Date  . Anemia   . Anxiety   . Arthritis   . Asthma   . Brittle bone disease   . Cataract   . CKD (chronic kidney disease)   . Complication of anesthesia    hard to wake up after anesthesia, trouble turning head  . Coronary artery disease, non-occlusive 2014   a. minimal CAD by cath in 2014 with 40% RI stenosis. b. low-risk NST in 11/2014.  Marland Kitchen Depression   . Diabetes mellitus type 2, insulin dependent ([place])   . Diabetic neuropathy ([place])   . Diastolic dysfunction, left ventricle 11/30/14   EF 55%, grade 1 DD  . DVT (deep venous thrombosis) ([place])    "18 in RLE; 7 LLE prior to PE" (03/24/2015)  . Family history of adverse reaction to anesthesia    " the whole family is hard to wake up"  . Glaucoma   . H/O hiatal hernia   . Hyperlipidemia   . Hypertension associated with diabetes ([place])   . Hypothyroidism   . Kidney stones    "passed them"  . Memory loss 06/03/2014  . On home oxygen therapy    "2L  oxygen concentrator @ night"  . Osteoporosis   . Oxygen desaturation during sleep    wears 2 liters of oxygen at night   . Palpitations 35years ago   Cardionet monitor - revealed mostly normal sinus rhythm, sinus bradycardia with first-degree A-V block heart rates mostly in the 50s and 60s with some 70s. No arrhythmias, PVCs or PACs noted.  . PE (pulmonary thromboembolism) ([place]) x 3, last one was ~ 2003   history of recurrent RLE DVT with PE - last PE ~>13 yrs; Maintained on Plavix  . Pneumonia   . Presence of permanent cardiac pacemaker   . Restless legs syndrome   . Sleep apnea    cpap disontinued; test done 07/14/2008 ordered per  Byrum  . Spinal headache   . Spinal stenosis    L 3, L 4 and L 5, and C 1, C 2 and C3  . Stroke [facility]) 04-19-2013   tia and stroke.  Weakness Rt hand  . 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 s/p [employer] PPM in 03/2015 by Dr. Lovena Le secondary to sinus node dysfunction.   Marland Kitchen TIA (transient ischemic attack) March 2014    Past Surgical History:  Procedure Laterality Date  . ABDOMINAL HYSTERECTOMY    . APPENDECTOMY    . BACK SURGERY     steroid inj  . CATARACT EXTRACTION W/ INTRAOCULAR LENS  IMPLANT, BILATERAL Bilateral 2000s  . CHOLECYSTECTOMY    . EP IMPLANTABLE DEVICE N/A 03/24/2015   [facility] Scientific PPM, Dr. Lovena Le  . ESOPHAGOGASTRODUODENOSCOPY  08/09/2011   Procedure: ESOPHAGOGASTRODUODENOSCOPY (EGD);  Surgeon: Jeryl Columbia, MD;  Location: [facility];  Service: Endoscopy;  Laterality: N/A;  . ESOPHAGOGASTRODUODENOSCOPY (EGD) WITH PROPOFOL N/A 11/12/2013   Procedure: ESOPHAGOGASTRODUODENOSCOPY (EGD) WITH PROPOFOL;  Surgeon: Jeryl Columbia, MD;  Location: [facility] ENDOSCOPY;  Service: Endoscopy;  Laterality: N/A;  . ESOPHAGOGASTRODUODENOSCOPY (EGD) WITH PROPOFOL N/A 05/05/2016   Procedure: ESOPHAGOGASTRODUODENOSCOPY (EGD) WITH PROPOFOL;  Surgeon: Clarene Essex, MD;  Location[facility];  Service: Endoscopy;  Laterality: N/A;  . GLAUCOMA SURGERY  Bilateral 2000s   "laser"  . HOT HEMOSTASIS  08/09/2011   Procedure: HOT HEMOSTASIS (ARGON PLASMA COAGULATION/BICAP);  Surgeon: Jeryl Columbia, MD;  Location: [facility];  Service: Endoscopy;  Laterality: N/A;  . HOT HEMOSTASIS N/A 11/12/2013   Procedure: HOT HEMOSTASIS (ARGON PLASMA COAGULATION/BICAP);  Surgeon: Jeryl Columbia, MD;  Location: [facility];  Service: Endoscopy;  Laterality: N/A;  . INSERT / REPLACE / REMOVE PACEMAKER  03/24/2015  . JOINT REPLACEMENT    . KNEE SURGERY Left done with 2nd left knee replacement   spacer bar placement  . LEFT HEART CATHETERIZATION WITH CORONARY ANGIOGRAM N/A 02/14/2012   Procedure: LEFT HEART CATHETERIZATION WITH CORONARY ANGIOGRAM;  Surgeon: Leonie Man, MD;  Location: [facility] CATH LAB;  Service: Cardiovascular:: no evidence of obstructive coronary disease ,low EDP with normal EF  . LEV DOPPLER  05/29/2012   RIGHT EXTREM. NORMAL VENOUS DUPLEX  . NM MYOCAR PERF WALL MOTION  01/26/2012; 11/2014   a. EF 79%,LV normal,ST SEGMENT CHANGE SUGGESTIVE OF ISCHEMIA.; LOW RISK, EF 60%. No ischemia or infarction. No wall motion abnormality   . PACEMAKER PLACEMENT    . REVISION TOTAL KNEE ARTHROPLASTY Left   . TONSILLECTOMY    . TOTAL KNEE ARTHROPLASTY Bilateral   . TRANSTHORACIC ECHOCARDIOGRAM  10/2016   EF 65-70%.  Vigorous LV function.  Mild LVH.  Poor acoustic windows.  Cannot assess diastolic function or significant valvular dysfunction.  Domingo Dimes ECHOCARDIOGRAM  2014; October 2016   a. EF 41-32%; normal diastolic pressures, no regional wall motion motion abnormalities;; b/ F 55-60%. Normal wall motion. GR 1 DD. No valve lesions  . TRIGGER FINGER RELEASE  11/22/2011   Procedure: RELEASE TRIGGER FINGER/A-1 PULLEY;  Surgeon: Meredith Pel, MD;  Location: [place];  Service: Orthopedics;  Laterality: Left;  Left trigger thumb release  . TRIGGER FINGER RELEASE Right yrs ago    Current Meds  Medication Sig  . acetaminophen (TYLENOL) 500 MG tablet Take 2  tablets (1,000 mg total) by mouth every 6 (six) hours as needed.  Marland Kitchen albuterol (PROVENTIL HFA;VENTOLIN HFA) 108 (90 BASE) MCG/ACT inhaler Inhale 2 puffs into the lungs every 6 (six) hours as needed for wheezing.  . bisacodyl (FLEET) 10 MG/30ML ENEM Place 10 mg rectally once.  . busPIRone (BUSPAR) 10 MG tablet Take 1 tablet (10 mg total) by mouth every morning.  . Cholecalciferol (VITAMIN D-3 PO) Take  2 tablets by mouth every morning.   . clonazePAM (KLONOPIN) 0.5 MG tablet Take 0.5-1 mg by mouth See admin instructions. Takes 2 tabs in am and 1 tab in pm.  . clopidogrel (PLAVIX) 75 MG tablet Take 75 mg by mouth every morning.  . dicyclomine (BENTYL) 10 MG capsule Take 1 capsule (10 mg total) by mouth 3 (three) times daily before meals.  Marland Kitchen DM-APAP-CPM (VICKS FORMULA 44 COUGH/COLD PM PO) Take by mouth. Used 1/2 cup  . furosemide (LASIX) 20 MG tablet Take 1 tablet (20 mg total) by mouth daily as needed for fluid or edema.  Marland Kitchen glipiZIDE (GLUCOTROL) 10 MG tablet Take 20 mg by mouth 2 (two) times daily before a meal.   . guaiFENesin (MUCINEX) 600 MG 12 hr tablet Take 600 mg by mouth 2 (two) times daily as needed for cough.  . insulin NPH (HUMULIN N,NOVOLIN N) 100 UNIT/ML injection Inject 0-20 Units into the skin See admin instructions. Novolin-N Give 20 unit in morning; give  5 units at 4pm; evening dose as needed.  If less than 150 blood sugar= 0units.  If greater than 200 blood sugar= give 10units to decrease by 100 blood sugar  . insulin regular (NOVOLIN R,HUMULIN R) 100 units/mL injection Inject 0-25 Units into the skin See admin instructions. Give 25 unit in morning; give 20units at 4pm; evening dose as needed.  If less than 150 blood sugar= 0units.  If greater than 200 blood sugar= give 10units to decrease by 100 blood sugar  . ipratropium (ATROVENT) 0.03 % nasal spray Place 2 sprays into both nostrils every 12 (twelve) hours. (Patient taking differently: Place 2 sprays into both nostrils every 12  (twelve) hours as needed for rhinitis. )  . levothyroxine (SYNTHROID, LEVOTHROID) 100 MCG tablet Take 100 mcg by mouth every morning.   . lidocaine (LIDODERM) 5 % Place 3 patches onto the skin daily as needed (pain). Remove & Discard patch within 12 hours or as directed by MD  . linaclotide (LINZESS) 290 MCG CAPS capsule Take 290 mcg by mouth daily before breakfast.  . LORazepam (ATIVAN) 0.5 MG tablet Take 0.5 mg by mouth daily as needed for anxiety.  . magnesium 30 MG tablet Take 30 mg by mouth daily.  . nortriptyline (PAMELOR) 50 MG capsule TAKE 2 CAPSULES (100MG ) AT BEDTIME (Patient taking differently: TAKE 1 CAPSULES (100MG ) AT two times daily)  . ondansetron (ZOFRAN) 4 MG tablet Take 4 mg by mouth as needed.  . OXYGEN Inhale 2 L into the lungs as needed. Used at overnight with a concentrator   . polyethylene glycol (MIRALAX / GLYCOLAX) packet Take 17 g by mouth daily as needed for mild constipation.   . potassium chloride SA (K-DUR,KLOR-CON) 20 MEQ tablet Take 1 tablet (20 mEq total) by mouth 2 (two) times daily.  . rosuvastatin (CRESTOR) 20 MG tablet Take 20 mg by mouth daily.   . solifenacin (VESICARE) 10 MG tablet Take 10 mg by mouth daily as needed (overactive bladder).   Marland Kitchen terconazole (TERAZOL 7) 0.4 % vaginal cream Place 1 applicator vaginally at bedtime as needed (for yeast infection).   . traMADol (ULTRAM) 50 MG tablet Take 1 tablet (50 mg total) by mouth every 12 (twelve) hours as needed for moderate pain. (Patient taking differently: Take 50 mg by mouth 2 (two) times daily as needed for moderate pain. )  . vitamin B-12 (CYANOCOBALAMIN) 500 MCG tablet Take 1 tablet (500 mcg total) by mouth daily.    Allergies  Allergen  Reactions  . Iodine Other (See Comments)    ONLY IV--Makes unconscious  . Iohexol Other (See Comments)     Code: SOB, Desc: cardiac arrest w/ iv contrast, has never used 13 hr prep//alice calhoun, Onset Date: [id number]   . Metoclopramide Hcl Other (See Comments)      suicidal  . Sulfa Antibiotics Hives  . Lactose Intolerance (Gi) Diarrhea  . Lansoprazole Nausea And Vomiting    Social History   Tobacco Use  . Smoking status: Never Smoker  . Smokeless tobacco: Never Used  Substance Use Topics  . Alcohol use: No    Alcohol/week: 0.0 standard drinks  . Drug use: No   Social History   Social History Narrative   Married [name].) married 74 years.   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     family history includes Cancer in her mother; Cancer - Prostate in her father.  Wt Readings from Last 3 Encounters:  10/19/17 260 lb (117.9 kg)  09/14/17 265 lb 11.2 oz (120.5 kg)  08/23/17 270 lb (122.5 kg)    PHYSICAL EXAM BP 137/68   Pulse 60   Ht 5' 7.5" (1.715 m)   Wt 260 lb (117.9 kg)   SpO2 99%   BMI 40.12 kg/m  Physical Exam  Constitutional: She is oriented to person, place, and time. She appears well-developed and well-nourished. No distress.  Morbidly obese.  Well-groomed.  HENT:  Head: Normocephalic and atraumatic.  Neck: Normal range of motion. Neck supple. No hepatojugular reflux and no JVD present. Carotid bruit is not present. No tracheal deviation present. No thyromegaly present.  Cardiovascular: Normal rate, regular rhythm, S1 normal, S2 normal and intact distal pulses. PMI is not displaced (Unable to palpate). Exam reveals distant heart sounds. Exam reveals no gallop and no friction rub.  No murmur heard. Pulmonary/Chest: Effort normal and breath sounds normal. No respiratory distress. She has no wheezes. She has no rales.  Abdominal: Soft. Bowel sounds are normal. She exhibits no distension. There is no tenderness. There is no rebound.  Musculoskeletal: Normal range of motion. She exhibits edema (Trivial).  Neurological: She is alert and oriented to person, place, and time. No cranial nerve deficit.  Psychiatric: She has a normal mood and affect. Her behavior is normal. Judgment and  thought content normal.  Vitals reviewed.    Adult ECG Report  Rate: 70 ;  Rhythm: normal sinus rhythm and 1 degree AVB.  Poor R wave progression (cannot exclude anterior MI, age undetermined).  Otherwise normal axis, intervals and durations.;   Narrative Interpretation: Stable EKG   Other studies Reviewed:  Additional studies/ records that were reviewed today include:  Recent Labs:  Lab Results  Component Value Date   CREATININE 1.66 (H) 09/14/2017   BUN 19 09/14/2017   NA 140 09/14/2017   K 4.0 09/14/2017   CL 104 09/14/2017   CO2 24 09/14/2017    ASSESSMENT / PLAN: Problem List Items Addressed This Visit    Chronic diastolic heart failure (HCC) (Chronic)    Unfortunately, we are not really able to treat according to recommendations with any type of afterload reduction agent, having just stopped her ARB. On as needed Lasix.  No longer on beta-blocker.  We will recheck 2D echocardiogram given her exertional dyspnea.      Relevant Medications   midodrine (PROAMATINE) 2.5 MG tablet   Other Relevant Orders   ECHOCARDIOGRAM COMPLETE   DOE (dyspnea on exertion) (  Chronic)    Probably related to deconditioning, obesity etc., but will check 2D echocardiogram to exclude any new cardiac etiology.      Relevant Orders   ECHOCARDIOGRAM COMPLETE   Edema of both legs   Relevant Orders   ECHOCARDIOGRAM COMPLETE   Essential hypertension (Chronic)    Still had falling spells on irbesartan 75 mg.  For now all antihypertensives are on hold because of orthostatic hypotension issues. Her blood pressure actually does not look bad despite being off medications.  I think she may do fine without treatment.      Relevant Medications   midodrine (PROAMATINE) 2.5 MG tablet   Morbid obesity ([place])    She is lost quite a bit of weight over the last year.  Husband thinks it may be at least 50 pounds.  BMI down to 40 from 43 in the past.  Unfortunately, she does not have the strength to  exercise.  She is working with physical therapy to help her balance and core strength.  I think she would benefit from water aerobics, but has a fear of water. I do think she needs to walk with a walker, but has a fear of walkers.      Neurocardiogenic syncope    Has not truly had syncope, but is definitely having classic symptoms of neurocardiogenic near syncope with falls associated with hypotension and tachycardia.  Starting Midodrine (proAmatine) - but consider Northera.      Relevant Medications   midodrine (PROAMATINE) 2.5 MG tablet   Other Relevant Orders   ECHOCARDIOGRAM COMPLETE   Orthostatic dizziness - Primary    She has not had any further falls since her last hospitalization when her ARB was discontinued and her Lasix was placed as PRN. For now we are allowing permissive hypertension and avoiding antihypertensive agents.  Most likely this is neurocardiogenic mediated,  Plan: Increase hydration, support stockings, we will initiate midodrine & will consider the possibility of Northera.      Syncope due to orthostatic hypotension (Chronic)    Thankfully, her most recent falls of not really been true syncope but merely near syncope.  I truly think that this is a neurocardiogenic syncope related to her peripheral neuropathy, but also has something to do with her generalized weakness and deconditioning.  Plan for now is to keep her off of her antihypertensives (this does fly straight in the face of the recommendations from nephrology for tight blood pressure control.  I do not want have tight blood pressure control with blood pressures less than 130/80, and do not want to use the ARB because of her frequent falls.  We will simply stay off of the ARB and other antihypertensives for now.  Increase hydration, but reluctantly encourage liberalization of salt.  Continue to encourage foot elevation whenever possible and wearing support stockings daily.  Plan for now will be to treat  with midodrine 2.5 mg 3 times daily and potentially titrate up further.  We will need to consider Northera as an option.  Have discussed with our clinical pharmacists.      Relevant Medications   midodrine (PROAMATINE) 2.5 MG tablet       I spent a total of 35 minutes with the patient and chart review. >  50% of the time was spent in direct patient consultation.   Current medicines are reviewed at length with the patient today.  (+/- concerns) n/a The following changes have been made:  Reduce irbesartan to 75 mg and Lasix standing  dose of 20 mg with additional 20 as needed.  Patient Instructions  MEDICATION INSTRUCTIONS   START MIDODRINE 2.5 MG  ONE TABLET  THREE TIMES A DAY   WEAR SUPPORT STOCKING  OR SOCKS  DAILY AND TAKE OFF AT NOGHT.  KEEP FEET ELEVATED AS MUCH AS POSSIBLE.   SCHEDULE AT [facility]  Your physician recommends that you schedule a follow-up appointment in [place] 2019 [place].   Studies Ordered:   Orders Placed This Encounter  Procedures  . ECHOCARDIOGRAM COMPLETE      Glenetta Hew, M.D., M.S. Interventional Cardiologist   Pager # [phone] Phone # [phone] [address] [place], [place] [address]   Thank you for choosing [facility] at [facility]!!

## 2017-10-19 NOTE — Patient Instructions (Addendum)
MEDICATION INSTRUCTIONS   START MIDODRINE 2.5 MG  ONE TABLET  THREE TIMES A DAY   WEAR SUPPORT STOCKING  OR SOCKS  DAILY AND TAKE OFF AT NOGHT.  KEEP FEET ELEVATED AS MUCH AS POSSIBLE.   SCHEDULE AT [place] has requested that you have an echocardiogram. Echocardiography is a painless test that uses sound waves to create images of your heart. It provides your doctor with information about the size and shape of your heart and how well your heart's chambers and valves are working. This procedure takes approximately one hour. There are no restrictions for this procedure.   Your physician recommends that you schedule a follow-up appointment in [place] 2019 [place].

## 2017-10-20 DIAGNOSIS — I5042 Chronic combined systolic (congestive) and diastolic (congestive) heart failure: Secondary | ICD-10-CM | POA: Diagnosis not present

## 2017-10-20 DIAGNOSIS — M48061 Spinal stenosis, lumbar region without neurogenic claudication: Secondary | ICD-10-CM | POA: Diagnosis not present

## 2017-10-20 DIAGNOSIS — I130 Hypertensive heart and chronic kidney disease with heart failure and stage 1 through stage 4 chronic kidney disease, or unspecified chronic kidney disease: Secondary | ICD-10-CM | POA: Diagnosis not present

## 2017-10-20 DIAGNOSIS — M47812 Spondylosis without myelopathy or radiculopathy, cervical region: Secondary | ICD-10-CM | POA: Diagnosis not present

## 2017-10-20 DIAGNOSIS — E1122 Type 2 diabetes mellitus with diabetic chronic kidney disease: Secondary | ICD-10-CM | POA: Diagnosis not present

## 2017-10-22 ENCOUNTER — Encounter: Payer: Self-pay | Admitting: Cardiology

## 2017-10-22 DIAGNOSIS — I509 Heart failure, unspecified: Secondary | ICD-10-CM | POA: Diagnosis not present

## 2017-10-22 DIAGNOSIS — I10 Essential (primary) hypertension: Secondary | ICD-10-CM | POA: Diagnosis not present

## 2017-10-22 NOTE — Assessment & Plan Note (Signed)
Thankfully, her most recent falls of not really been true syncope but merely near syncope.  I truly think that this is a neurocardiogenic syncope related to her peripheral neuropathy, but also has something to do with her generalized weakness and deconditioning.  Plan for now is to keep her off of her antihypertensives (this does fly straight in the face of the recommendations from nephrology for tight blood pressure control.  I do not want have tight blood pressure control with blood pressures less than 130/80, and do not want to use the ARB because of her frequent falls.  We will simply stay off of the ARB and other antihypertensives for now.  Increase hydration, but reluctantly encourage liberalization of salt.  Continue to encourage foot elevation whenever possible and wearing support stockings daily.  Plan for now will be to treat with midodrine 2.5 mg 3 times daily and potentially titrate up further.  We will need to consider Northera as an option.  Have discussed with our clinical pharmacists.

## 2017-10-22 NOTE — Assessment & Plan Note (Signed)
Has not truly had syncope, but is definitely having classic symptoms of neurocardiogenic near syncope with falls associated with hypotension and tachycardia.  Starting Midodrine (proAmatine) - but consider Northera.

## 2017-10-22 NOTE — Assessment & Plan Note (Signed)
She is lost quite a bit of weight over the last year.  Husband thinks it may be at least 50 pounds.  BMI down to 40 from 43 in the past.  Unfortunately, she does not have the strength to exercise.  She is working with physical therapy to help her balance and core strength.  I think she would benefit from water aerobics, but has a fear of water. I do think she needs to walk with a walker, but has a fear of walkers.

## 2017-10-22 NOTE — Assessment & Plan Note (Signed)
Unfortunately, we are not really able to treat according to recommendations with any type of afterload reduction agent, having just stopped her ARB. On as needed Lasix.  No longer on beta-blocker.  We will recheck 2D echocardiogram given her exertional dyspnea.

## 2017-10-22 NOTE — Assessment & Plan Note (Signed)
Still had falling spells on irbesartan 75 mg.  For now all antihypertensives are on hold because of orthostatic hypotension issues. Her blood pressure actually does not look bad despite being off medications.  I think she may do fine without treatment.

## 2017-10-22 NOTE — Assessment & Plan Note (Signed)
Probably related to deconditioning, obesity etc., but will check 2D echocardiogram to exclude any new cardiac etiology.

## 2017-10-22 NOTE — Assessment & Plan Note (Signed)
She has not had any further falls since her last hospitalization when her ARB was discontinued and her Lasix was placed as PRN. For now we are allowing permissive hypertension and avoiding antihypertensive agents.  Most likely this is neurocardiogenic mediated,  Plan: Increase hydration, support stockings, we will initiate midodrine & will consider the possibility of Northera.

## 2017-10-24 DIAGNOSIS — I509 Heart failure, unspecified: Secondary | ICD-10-CM | POA: Diagnosis not present

## 2017-10-24 DIAGNOSIS — M47812 Spondylosis without myelopathy or radiculopathy, cervical region: Secondary | ICD-10-CM | POA: Diagnosis not present

## 2017-10-24 DIAGNOSIS — N2581 Secondary hyperparathyroidism of renal origin: Secondary | ICD-10-CM | POA: Diagnosis not present

## 2017-10-24 DIAGNOSIS — Z950 Presence of cardiac pacemaker: Secondary | ICD-10-CM | POA: Diagnosis not present

## 2017-10-24 DIAGNOSIS — I5042 Chronic combined systolic (congestive) and diastolic (congestive) heart failure: Secondary | ICD-10-CM | POA: Diagnosis not present

## 2017-10-24 DIAGNOSIS — Z23 Encounter for immunization: Secondary | ICD-10-CM | POA: Diagnosis not present

## 2017-10-24 DIAGNOSIS — E1122 Type 2 diabetes mellitus with diabetic chronic kidney disease: Secondary | ICD-10-CM | POA: Diagnosis not present

## 2017-10-24 DIAGNOSIS — I130 Hypertensive heart and chronic kidney disease with heart failure and stage 1 through stage 4 chronic kidney disease, or unspecified chronic kidney disease: Secondary | ICD-10-CM | POA: Diagnosis not present

## 2017-10-24 DIAGNOSIS — I951 Orthostatic hypotension: Secondary | ICD-10-CM | POA: Diagnosis not present

## 2017-10-24 DIAGNOSIS — I2510 Atherosclerotic heart disease of native coronary artery without angina pectoris: Secondary | ICD-10-CM | POA: Diagnosis not present

## 2017-10-24 DIAGNOSIS — N184 Chronic kidney disease, stage 4 (severe): Secondary | ICD-10-CM | POA: Diagnosis not present

## 2017-10-24 DIAGNOSIS — D631 Anemia in chronic kidney disease: Secondary | ICD-10-CM | POA: Diagnosis not present

## 2017-10-24 DIAGNOSIS — M48061 Spinal stenosis, lumbar region without neurogenic claudication: Secondary | ICD-10-CM | POA: Diagnosis not present

## 2017-10-26 ENCOUNTER — Ambulatory Visit (HOSPITAL_COMMUNITY): Payer: Medicare HMO | Attending: Cardiology

## 2017-10-26 ENCOUNTER — Other Ambulatory Visit: Payer: Self-pay

## 2017-10-26 DIAGNOSIS — R55 Syncope and collapse: Secondary | ICD-10-CM | POA: Diagnosis not present

## 2017-10-26 DIAGNOSIS — R600 Localized edema: Secondary | ICD-10-CM

## 2017-10-26 DIAGNOSIS — R0609 Other forms of dyspnea: Secondary | ICD-10-CM | POA: Diagnosis not present

## 2017-10-26 DIAGNOSIS — R0600 Dyspnea, unspecified: Secondary | ICD-10-CM

## 2017-10-26 DIAGNOSIS — I5032 Chronic diastolic (congestive) heart failure: Secondary | ICD-10-CM | POA: Insufficient documentation

## 2017-11-07 HISTORY — PX: TRANSTHORACIC ECHOCARDIOGRAM: SHX275

## 2017-11-17 ENCOUNTER — Encounter: Payer: Medicare HMO | Admitting: Internal Medicine

## 2017-11-17 ENCOUNTER — Ambulatory Visit (INDEPENDENT_AMBULATORY_CARE_PROVIDER_SITE_OTHER): Payer: Medicare HMO | Admitting: Internal Medicine

## 2017-11-17 VITALS — BP 114/60 | HR 71 | Ht 67.5 in

## 2017-11-17 DIAGNOSIS — Z950 Presence of cardiac pacemaker: Secondary | ICD-10-CM

## 2017-11-17 DIAGNOSIS — I495 Sick sinus syndrome: Secondary | ICD-10-CM

## 2017-11-17 DIAGNOSIS — R42 Dizziness and giddiness: Secondary | ICD-10-CM

## 2017-11-17 LAB — CUP PACEART INCLINIC DEVICE CHECK
Date Time Interrogation Session: 20191011040000
Implantable Lead Implant Date: 20170214
Implantable Lead Implant Date: 20170214
Implantable Lead Location: 753859
Implantable Lead Location: 753860
Implantable Lead Model: 7740
Implantable Lead Model: 7741
Implantable Lead Serial Number: 649859
Implantable Lead Serial Number: 728741
Implantable Pulse Generator Implant Date: 20170214
Lead Channel Impedance Value: 711 Ohm
Lead Channel Impedance Value: 757 Ohm
Lead Channel Pacing Threshold Amplitude: 0.9 V
Lead Channel Pacing Threshold Amplitude: 1.2 V
Lead Channel Pacing Threshold Pulse Width: 0.4 ms
Lead Channel Pacing Threshold Pulse Width: 0.4 ms
Lead Channel Sensing Intrinsic Amplitude: 2.7 mV
Lead Channel Sensing Intrinsic Amplitude: 25 mV
Lead Channel Setting Pacing Amplitude: 1.9 V
Lead Channel Setting Pacing Amplitude: 2 V
Lead Channel Setting Pacing Pulse Width: 0.4 ms
Lead Channel Setting Sensing Sensitivity: 2.5 mV
Pulse Gen Serial Number: 718098

## 2017-11-17 NOTE — Patient Instructions (Signed)
Medication Instructions:  Your physician recommends that you continue on your current medications as directed. Please refer to the Current Medication list given to you today.  Labwork: None ordered.  Testing/Procedures: None ordered.  Follow-Up: Your physician wants you to follow-up in: one year with Dr. Lovena Le.   You will receive a reminder letter in the mail two months in advance. If you don't receive a letter, please call our office to schedule the follow-up appointment.  Remote monitoring is used to monitor your Pacemaker from home. This monitoring reduces the number of office visits required to check your device to one time per year. It allows [place] to keep an eye on the functioning of your device to ensure it is working properly. You are scheduled for a device check from home on 12/14/2017. You may send your transmission at any time that day. If you have a wireless device, the transmission will be sent automatically. After your physician reviews your transmission, you will receive a postcard with your next transmission date.  Any Other Special Instructions Will Be Listed Below (If Applicable).  If you need a refill on your cardiac medications before your next appointment, please call your pharmacy.

## 2017-11-19 NOTE — Progress Notes (Signed)
HPI:  Tara [place] returns today for ongoing evaluation and management of sinus node dysfunction status post pacemaker insertion, hypertension, obesity, and DVTs. In the interim, she has become progressively more sedentary. She has a propensity to fall. She has not had syncope. She has chronic peripheral edema.   Allergies  Allergen Reactions  . Iodine Other (See Comments)    ONLY IV--Makes unconscious  . Iohexol Other (See Comments)     Code: SOB, Desc: cardiac arrest w/ iv contrast, has never used 13 hr prep//alice calhoun, Onset Date: [id number]   . Metoclopramide Hcl Other (See Comments)    suicidal  . Sulfa Antibiotics Hives  . Lactose Intolerance (Gi) Diarrhea  . Lansoprazole Nausea And Vomiting     Current Outpatient Medications  Medication Sig Dispense Refill  . acetaminophen (TYLENOL) 500 MG tablet Take 2 tablets (1,000 mg total) by mouth every 6 (six) hours as needed. 30 tablet 0  . albuterol (PROVENTIL HFA;VENTOLIN HFA) 108 (90 BASE) MCG/ACT inhaler Inhale 2 puffs into the lungs every 6 (six) hours as needed for wheezing.    . bisacodyl (FLEET) 10 MG/30ML ENEM Place 10 mg rectally once.    . busPIRone (BUSPAR) 10 MG tablet Take 1 tablet (10 mg total) by mouth every morning. 60 tablet 1  . Cholecalciferol (VITAMIN D-3 PO) Take 2 tablets by mouth every morning.     . clonazePAM (KLONOPIN) 0.5 MG tablet Take 0.5-1 mg by mouth See admin instructions. Takes 2 tabs in am and 1 tab in pm.    . clopidogrel (PLAVIX) 75 MG tablet Take 75 mg by mouth every morning.    . dicyclomine (BENTYL) 10 MG capsule Take 1 capsule (10 mg total) by mouth 3 (three) times daily before meals.    Marland Kitchen DM-APAP-CPM (VICKS FORMULA 44 COUGH/COLD PM PO) Take by mouth. Used 1/2 cup    . furosemide (LASIX) 20 MG tablet Take 1 tablet (20 mg total) by mouth daily as needed for fluid or edema. 90 tablet 3  . glipiZIDE (GLUCOTROL) 10 MG tablet Take 20 mg by mouth 2 (two) times daily before a meal.     .  guaiFENesin (MUCINEX) 600 MG 12 hr tablet Take 600 mg by mouth 2 (two) times daily as needed for cough.    . insulin NPH (HUMULIN N,NOVOLIN N) 100 UNIT/ML injection Inject 0-20 Units into the skin See admin instructions. Novolin-N Give 20 unit in morning; give  5 units at 4pm; evening dose as needed.  If less than 150 blood sugar= 0units.  If greater than 200 blood sugar= give 10units to decrease by 100 blood sugar    . insulin regular (NOVOLIN R,HUMULIN R) 100 units/mL injection Inject 0-25 Units into the skin See admin instructions. Give 25 unit in morning; give 20units at 4pm; evening dose as needed.  If less than 150 blood sugar= 0units.  If greater than 200 blood sugar= give 10units to decrease by 100 blood sugar    . ipratropium (ATROVENT) 0.03 % nasal spray Place 2 sprays into both nostrils every 12 (twelve) hours. (Patient taking differently: Place 2 sprays into both nostrils every 12 (twelve) hours as needed for rhinitis. ) 30 mL 0  . levothyroxine (SYNTHROID, LEVOTHROID) 100 MCG tablet Take 100 mcg by mouth every morning.     . lidocaine (LIDODERM) 5 % Place 3 patches onto the skin daily as needed (pain). Remove & Discard patch within 12 hours or as directed by MD    .  linaclotide (LINZESS) 290 MCG CAPS capsule Take 290 mcg by mouth daily before breakfast.    . LORazepam (ATIVAN) 0.5 MG tablet Take 0.5 mg by mouth daily as needed for anxiety.    . magnesium 30 MG tablet Take 30 mg by mouth daily.    . midodrine (PROAMATINE) 2.5 MG tablet Take 1 tablet (2.5 mg total) by mouth 3 (three) times daily with meals. 90 tablet 6  . nortriptyline (PAMELOR) 50 MG capsule TAKE 2 CAPSULES (100MG ) AT BEDTIME (Patient taking differently: TAKE 1 CAPSULES (100MG ) AT two times daily) 180 capsule 1  . ondansetron (ZOFRAN) 4 MG tablet Take 4 mg by mouth as needed.  0  . OXYGEN Inhale 2 L into the lungs as needed. Used at overnight with a concentrator     . polyethylene glycol (MIRALAX / GLYCOLAX) packet Take 17  g by mouth daily as needed for mild constipation.     . potassium chloride SA (K-DUR,KLOR-CON) 20 MEQ tablet Take 1 tablet (20 mEq total) by mouth 2 (two) times daily. 10 tablet 0  . rosuvastatin (CRESTOR) 20 MG tablet Take 20 mg by mouth daily.     . solifenacin (VESICARE) 10 MG tablet Take 10 mg by mouth daily as needed (overactive bladder).     Marland Kitchen terconazole (TERAZOL 7) 0.4 % vaginal cream Place 1 applicator vaginally at bedtime as needed (for yeast infection).     . traMADol (ULTRAM) 50 MG tablet Take 1 tablet (50 mg total) by mouth every 12 (twelve) hours as needed for moderate pain. (Patient taking differently: Take 50 mg by mouth 2 (two) times daily as needed for moderate pain. ) 30 tablet 0  . vitamin B-12 (CYANOCOBALAMIN) 500 MCG tablet Take 1 tablet (500 mcg total) by mouth daily. 30 tablet 0   No current facility-administered medications for this visit.      Past Medical History:  Diagnosis Date  . Anemia   . Anxiety   . Arthritis   . Asthma   . Brittle bone disease   . Cataract   . CKD (chronic kidney disease)   . Complication of anesthesia    hard to wake up after anesthesia, trouble turning head  . Coronary artery disease, non-occlusive 2014   a. minimal CAD by cath in 2014 with 40% RI stenosis. b. low-risk NST in 11/2014.  Marland Kitchen Depression   . Diabetes mellitus type 2, insulin dependent ([place])   . Diabetic neuropathy ([place])   . Diastolic dysfunction, left ventricle 11/30/14   EF 55%, grade 1 DD  . DVT (deep venous thrombosis) ([place])    "18 in RLE; 7 LLE prior to PE" (03/24/2015)  . Family history of adverse reaction to anesthesia    " the whole family is hard to wake up"  . Glaucoma   . H/O hiatal hernia   . Hyperlipidemia   . Hypertension associated with diabetes ([place])   . Hypothyroidism   . Kidney stones    "passed them"  . Memory loss 06/03/2014  . On home oxygen therapy    "2L oxygen concentrator @ night"  . Osteoporosis   . Oxygen desaturation during sleep     wears 2 liters of oxygen at night   . Palpitations 35years ago   Cardionet monitor - revealed mostly normal sinus rhythm, sinus bradycardia with first-degree A-V block heart rates mostly in the 50s and 60s with some 70s. No arrhythmias, PVCs or PACs noted.  . PE (pulmonary thromboembolism) ([facility]) x 3, last one  was ~ 2003   history of recurrent RLE DVT with PE - last PE ~>13 yrs; Maintained on Plavix  . Pneumonia   . Presence of permanent cardiac pacemaker   . Restless legs syndrome   . Sleep apnea    cpap disontinued; test done 07/14/2008 ordered per  Byrum  . Spinal headache   . Spinal stenosis    L 3, L 4 and L 5, and C 1, C 2 and C3  . Stroke [facility]) 04-19-2013   tia and stroke. Weakness Rt hand  . 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 s/p [employer] PPM in 03/2015 by Dr. Lovena Le secondary to sinus node dysfunction.   Marland Kitchen TIA (transient ischemic attack) March 2014    ROS:   All systems reviewed and negative except as noted in the HPI.   Past Surgical History:  Procedure Laterality Date  . ABDOMINAL HYSTERECTOMY    . APPENDECTOMY    . BACK SURGERY     steroid inj  . CATARACT EXTRACTION W/ INTRAOCULAR LENS  IMPLANT, BILATERAL Bilateral 2000s  . CHOLECYSTECTOMY    . EP IMPLANTABLE DEVICE N/A 03/24/2015   [facility] PPM, Dr. Lovena Le  . ESOPHAGOGASTRODUODENOSCOPY  08/09/2011   Procedure: ESOPHAGOGASTRODUODENOSCOPY (EGD);  Surgeon: Jeryl Columbia, MD;  Location: [facility];  Service: Endoscopy;  Laterality: N/A;  . ESOPHAGOGASTRODUODENOSCOPY (EGD) WITH PROPOFOL N/A 11/12/2013   Procedure: ESOPHAGOGASTRODUODENOSCOPY (EGD) WITH PROPOFOL;  Surgeon: Jeryl Columbia, MD;  Location: [facility] ENDOSCOPY;  Service: Endoscopy;  Laterality: N/A;  . ESOPHAGOGASTRODUODENOSCOPY (EGD) WITH PROPOFOL N/A 05/05/2016   Procedure: ESOPHAGOGASTRODUODENOSCOPY (EGD) WITH PROPOFOL;  Surgeon: Clarene Essex, MD;  Location: [facility] ENDOSCOPY;  Service: Endoscopy;  Laterality: N/A;  . GLAUCOMA SURGERY Bilateral 2000s    "laser"  . HOT HEMOSTASIS  08/09/2011   Procedure: HOT HEMOSTASIS (ARGON PLASMA COAGULATION/BICAP);  Surgeon: Jeryl Columbia, MD;  Location: [facility];  Service: Endoscopy;  Laterality: N/A;  . HOT HEMOSTASIS N/A 11/12/2013   Procedure: HOT HEMOSTASIS (ARGON PLASMA COAGULATION/BICAP);  Surgeon: Jeryl Columbia, MD;  Location: [facility];  Service: Endoscopy;  Laterality: N/A;  . INSERT / REPLACE / REMOVE PACEMAKER  03/24/2015  . JOINT REPLACEMENT    . KNEE SURGERY Left done with 2nd left knee replacement   spacer bar placement  . LEFT HEART CATHETERIZATION WITH CORONARY ANGIOGRAM N/A 02/14/2012   Procedure: LEFT HEART CATHETERIZATION WITH CORONARY ANGIOGRAM;  Surgeon: Leonie Man, MD;  Location: [facility] CATH LAB;  Service: Cardiovascular:: no evidence of obstructive coronary disease ,low EDP with normal EF  . LEV DOPPLER  05/29/2012   RIGHT EXTREM. NORMAL VENOUS DUPLEX  . NM MYOCAR PERF WALL MOTION  01/26/2012; 11/2014   a. EF 79%,LV normal,ST SEGMENT CHANGE SUGGESTIVE OF ISCHEMIA.; LOW RISK, EF 60%. No ischemia or infarction. No wall motion abnormality   . PACEMAKER PLACEMENT    . REVISION TOTAL KNEE ARTHROPLASTY Left   . TONSILLECTOMY    . TOTAL KNEE ARTHROPLASTY Bilateral   . TRANSTHORACIC ECHOCARDIOGRAM  10/2016   EF 65-70%.  Vigorous LV function.  Mild LVH.  Poor acoustic windows.  Cannot assess diastolic function or significant valvular dysfunction.  Domingo Dimes ECHOCARDIOGRAM  2014; October 2016   a. EF 72-53%; normal diastolic pressures, no regional wall motion motion abnormalities;; b/ F 55-60%. Normal wall motion. GR 1 DD. No valve lesions  . TRIGGER FINGER RELEASE  11/22/2011   Procedure: RELEASE TRIGGER FINGER/A-1 PULLEY;  Surgeon: Meredith Pel, MD;  Location: [place];  Service: Orthopedics;  Laterality: Left;  Left trigger thumb release  . TRIGGER FINGER RELEASE Right yrs ago     Family History  Problem Relation Age of Onset  . Cancer Mother   . Cancer - Prostate Father       Social History   Socioeconomic History  . Marital status: Married    Spouse name: [name]  . Number of children: 2  . Years of education: college  . Highest education level: Not on file  Occupational History  . Occupation: retired  Scientific laboratory technician  . Financial resource strain: Not on file  . Food insecurity:    Worry: Not on file    Inability: Not on file  . Transportation needs:    Medical: Not on file    Non-medical: Not on file  Tobacco Use  . Smoking status: Never Smoker  . Smokeless tobacco: Never Used  Substance and Sexual Activity  . Alcohol use: No    Alcohol/week: 0.0 standard drinks  . Drug use: No  . Sexual activity: Yes  Lifestyle  . Physical activity:    Days per week: Not on file    Minutes per session: Not on file  . Stress: Not on file  Relationships  . Social connections:    Talks on phone: Not on file    Gets together: Not on file    Attends religious service: Not on file    Active member of club or organization: Not on file    Attends meetings of clubs or organizations: Not on file    Relationship status: Not on file  . Intimate partner violence:    Fear of current or ex partner: Not on file    Emotionally abused: Not on file    Physically abused: Not on file    Forced sexual activity: Not on file  Other Topics Concern  . Not on file  Social History Narrative   Married [name].) married 55 years.   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      BP 114/60   Pulse 71   Ht 5' 7.5" (1.715 m)   SpO2 93%   BMI 40.12 kg/m   Physical Exam:  Well appearing NAD HEENT: Unremarkable Neck:  No JVD, no thyromegally Lymphatics:  No adenopathy Back:  No CVA tenderness Lungs:  Clear HEART:  Regular rate rhythm, no murmurs, no rubs, no clicks Abd:  soft, positive bowel sounds, no organomegally, no rebound, no guarding Ext:  2 plus pulses, no edema, no cyanosis, no clubbing Skin:  No rashes no nodules Neuro:   CN II through XII intact, motor grossly intact  EKG - none  DEVICE  Normal device function.  See PaceArt for details.   Assess/Plan: 1. Sinus node dysfunction - she is asymptomatic, s/p PPM 2. PPM - her boston sci DDD PM is working normally and she has over 11 years of battery longevity.  3. HTN - her blood pressure is well controlled.  Mikle Bosworth.D.

## 2017-11-21 DIAGNOSIS — I10 Essential (primary) hypertension: Secondary | ICD-10-CM | POA: Diagnosis not present

## 2017-11-21 DIAGNOSIS — I509 Heart failure, unspecified: Secondary | ICD-10-CM | POA: Diagnosis not present

## 2017-12-05 ENCOUNTER — Ambulatory Visit: Payer: Medicare HMO

## 2017-12-05 NOTE — Progress Notes (Deleted)
12/05/2017 [facility] 11-20-1940 [id number]   HPI:  Tara Jackson is a 77 y.o. female patient of Dr Ellyn Hack, with a PMH below who presents today for hypertension clinic evaluation.  She has been having problems with hypotension  Glipizide 20 mg bid???  Blood Pressure Goal:  130/80  Current Medications:  Midodrine 2.5 mg tid  Family Hx:  Social Hx:  Diet:  Exercise:  Home BP readings:  Intolerances:   Labs:  Wt Readings from Last 3 Encounters:  10/19/17 260 lb (117.9 kg)  09/14/17 265 lb 11.2 oz (120.5 kg)  08/23/17 270 lb (122.5 kg)   BP Readings from Last 3 Encounters:  11/17/17 114/60  10/19/17 137/68  09/14/17 (!) 121/48   Pulse Readings from Last 3 Encounters:  11/17/17 71  10/19/17 60  09/14/17 60    Current Outpatient Medications  Medication Sig Dispense Refill  . acetaminophen (TYLENOL) 500 MG tablet Take 2 tablets (1,000 mg total) by mouth every 6 (six) hours as needed. 30 tablet 0  . albuterol (PROVENTIL HFA;VENTOLIN HFA) 108 (90 BASE) MCG/ACT inhaler Inhale 2 puffs into the lungs every 6 (six) hours as needed for wheezing.    . bisacodyl (FLEET) 10 MG/30ML ENEM Place 10 mg rectally once.    . busPIRone (BUSPAR) 10 MG tablet Take 1 tablet (10 mg total) by mouth every morning. 60 tablet 1  . Cholecalciferol (VITAMIN D-3 PO) Take 2 tablets by mouth every morning.     . clonazePAM (KLONOPIN) 0.5 MG tablet Take 0.5-1 mg by mouth See admin instructions. Takes 2 tabs in am and 1 tab in pm.    . clopidogrel (PLAVIX) 75 MG tablet Take 75 mg by mouth every morning.    . dicyclomine (BENTYL) 10 MG capsule Take 1 capsule (10 mg total) by mouth 3 (three) times daily before meals.    Marland Kitchen DM-APAP-CPM (VICKS FORMULA 44 COUGH/COLD PM PO) Take by mouth. Used 1/2 cup    . furosemide (LASIX) 20 MG tablet Take 1 tablet (20 mg total) by mouth daily as needed for fluid or edema. 90 tablet 3  . glipiZIDE (GLUCOTROL) 10 MG tablet Take 20 mg by mouth 2 (two) times  daily before a meal.     . guaiFENesin (MUCINEX) 600 MG 12 hr tablet Take 600 mg by mouth 2 (two) times daily as needed for cough.    . insulin NPH (HUMULIN N,NOVOLIN N) 100 UNIT/ML injection Inject 0-20 Units into the skin See admin instructions. Novolin-N Give 20 unit in morning; give  5 units at 4pm; evening dose as needed.  If less than 150 blood sugar= 0units.  If greater than 200 blood sugar= give 10units to decrease by 100 blood sugar    . insulin regular (NOVOLIN R,HUMULIN R) 100 units/mL injection Inject 0-25 Units into the skin See admin instructions. Give 25 unit in morning; give 20units at 4pm; evening dose as needed.  If less than 150 blood sugar= 0units.  If greater than 200 blood sugar= give 10units to decrease by 100 blood sugar    . ipratropium (ATROVENT) 0.03 % nasal spray Place 2 sprays into both nostrils every 12 (twelve) hours. (Patient taking differently: Place 2 sprays into both nostrils every 12 (twelve) hours as needed for rhinitis. ) 30 mL 0  . levothyroxine (SYNTHROID, LEVOTHROID) 100 MCG tablet Take 100 mcg by mouth every morning.     . lidocaine (LIDODERM) 5 % Place 3 patches onto the skin daily as needed (  pain). Remove & Discard patch within 12 hours or as directed by MD    . linaclotide (LINZESS) 290 MCG CAPS capsule Take 290 mcg by mouth daily before breakfast.    . LORazepam (ATIVAN) 0.5 MG tablet Take 0.5 mg by mouth daily as needed for anxiety.    . magnesium 30 MG tablet Take 30 mg by mouth daily.    . midodrine (PROAMATINE) 2.5 MG tablet Take 1 tablet (2.5 mg total) by mouth 3 (three) times daily with meals. 90 tablet 6  . nortriptyline (PAMELOR) 50 MG capsule TAKE 2 CAPSULES (100MG ) AT BEDTIME (Patient taking differently: TAKE 1 CAPSULES (100MG ) AT two times daily) 180 capsule 1  . ondansetron (ZOFRAN) 4 MG tablet Take 4 mg by mouth as needed.  0  . OXYGEN Inhale 2 L into the lungs as needed. Used at overnight with a concentrator     . polyethylene glycol (MIRALAX  / GLYCOLAX) packet Take 17 g by mouth daily as needed for mild constipation.     . potassium chloride SA (K-DUR,KLOR-CON) 20 MEQ tablet Take 1 tablet (20 mEq total) by mouth 2 (two) times daily. 10 tablet 0  . rosuvastatin (CRESTOR) 20 MG tablet Take 20 mg by mouth daily.     . solifenacin (VESICARE) 10 MG tablet Take 10 mg by mouth daily as needed (overactive bladder).     Marland Kitchen terconazole (TERAZOL 7) 0.4 % vaginal cream Place 1 applicator vaginally at bedtime as needed (for yeast infection).     . traMADol (ULTRAM) 50 MG tablet Take 1 tablet (50 mg total) by mouth every 12 (twelve) hours as needed for moderate pain. (Patient taking differently: Take 50 mg by mouth 2 (two) times daily as needed for moderate pain. ) 30 tablet 0  . vitamin B-12 (CYANOCOBALAMIN) 500 MCG tablet Take 1 tablet (500 mcg total) by mouth daily. 30 tablet 0   No current facility-administered medications for this visit.     Allergies  Allergen Reactions  . Iodine Other (See Comments)    ONLY IV--Makes unconscious  . Iohexol Other (See Comments)     Code: SOB, Desc: cardiac arrest w/ iv contrast, has never used 13 hr prep//alice calhoun, Onset Date: [id number]   . Metoclopramide Hcl Other (See Comments)    suicidal  . Sulfa Antibiotics Hives  . Lactose Intolerance (Gi) Diarrhea  . Lansoprazole Nausea And Vomiting    Past Medical History:  Diagnosis Date  . Anemia   . Anxiety   . Arthritis   . Asthma   . Brittle bone disease   . Cataract   . CKD (chronic kidney disease)   . Complication of anesthesia    hard to wake up after anesthesia, trouble turning head  . Coronary artery disease, non-occlusive 2014   a. minimal CAD by cath in 2014 with 40% RI stenosis. b. low-risk NST in 11/2014.  Marland Kitchen Depression   . Diabetes mellitus type 2, insulin dependent ([place])   . Diabetic neuropathy ([place])   . Diastolic dysfunction, left ventricle 11/30/14   EF 55%, grade 1 DD  . DVT (deep venous thrombosis) ([place])    "18 in RLE; 7  LLE prior to PE" (03/24/2015)  . Family history of adverse reaction to anesthesia    " the whole family is hard to wake up"  . Glaucoma   . H/O hiatal hernia   . Hyperlipidemia   . Hypertension associated with diabetes ([place])   . Hypothyroidism   . Kidney stones    "  passed them"  . Memory loss 06/03/2014  . On home oxygen therapy    "2L oxygen concentrator @ night"  . Osteoporosis   . Oxygen desaturation during sleep    wears 2 liters of oxygen at night   . Palpitations 35years ago   Cardionet monitor - revealed mostly normal sinus rhythm, sinus bradycardia with first-degree A-V block heart rates mostly in the 50s and 60s with some 70s. No arrhythmias, PVCs or PACs noted.  . PE (pulmonary thromboembolism) ([place]) x 3, last one was ~ 2003   history of recurrent RLE DVT with PE - last PE ~>13 yrs; Maintained on Plavix  . Pneumonia   . Presence of permanent cardiac pacemaker   . Restless legs syndrome   . Sleep apnea    cpap disontinued; test done 07/14/2008 ordered per  Byrum  . Spinal headache   . Spinal stenosis    L 3, L 4 and L 5, and C 1, C 2 and C3  . Stroke [facility]) 04-19-2013   tia and stroke. Weakness Rt hand  . 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 s/p [employer] PPM in 03/2015 by Dr. Lovena Le secondary to sinus node dysfunction.   Marland Kitchen TIA (transient ischemic attack) March 2014    There were no vitals taken for this visit.  No problem-specific Assessment & Plan notes found for this encounter.   Tommy Medal PharmD CPP [facility] [address] [place], [place] [address] [phone]

## 2017-12-08 DIAGNOSIS — E118 Type 2 diabetes mellitus with unspecified complications: Secondary | ICD-10-CM | POA: Diagnosis not present

## 2017-12-08 DIAGNOSIS — Z794 Long term (current) use of insulin: Secondary | ICD-10-CM | POA: Diagnosis not present

## 2017-12-12 ENCOUNTER — Ambulatory Visit: Payer: Medicare HMO | Admitting: Neurology

## 2017-12-12 ENCOUNTER — Encounter: Payer: Self-pay | Admitting: Neurology

## 2017-12-12 ENCOUNTER — Encounter

## 2017-12-12 VITALS — BP 155/73 | HR 65 | Ht 67.5 in | Wt 252.6 lb

## 2017-12-12 DIAGNOSIS — R413 Other amnesia: Secondary | ICD-10-CM | POA: Diagnosis not present

## 2017-12-12 DIAGNOSIS — I509 Heart failure, unspecified: Secondary | ICD-10-CM | POA: Diagnosis not present

## 2017-12-12 DIAGNOSIS — N183 Chronic kidney disease, stage 3 (moderate): Secondary | ICD-10-CM | POA: Diagnosis not present

## 2017-12-12 DIAGNOSIS — R69 Illness, unspecified: Secondary | ICD-10-CM | POA: Diagnosis not present

## 2017-12-12 DIAGNOSIS — Z9981 Dependence on supplemental oxygen: Secondary | ICD-10-CM | POA: Diagnosis not present

## 2017-12-12 DIAGNOSIS — I129 Hypertensive chronic kidney disease with stage 1 through stage 4 chronic kidney disease, or unspecified chronic kidney disease: Secondary | ICD-10-CM | POA: Diagnosis not present

## 2017-12-12 DIAGNOSIS — E1140 Type 2 diabetes mellitus with diabetic neuropathy, unspecified: Secondary | ICD-10-CM | POA: Diagnosis not present

## 2017-12-12 DIAGNOSIS — I519 Heart disease, unspecified: Secondary | ICD-10-CM | POA: Diagnosis not present

## 2017-12-12 DIAGNOSIS — E038 Other specified hypothyroidism: Secondary | ICD-10-CM | POA: Diagnosis not present

## 2017-12-12 NOTE — Patient Instructions (Signed)
I had a long discussion with the patient and her husband regarding her new complaints of subjective memory difficulties which likely represent mild cognitive impairment.  I recommend she discuss with Dr. Virgina Jock reduction in the dose of Synthroid since that TSH appears to be significantly suppressed.  Check vitamin B12, RPR, EEG and MRI scan of the brain to look for other reversible conditions.  If her complaints persists upon follow-up visit in 2 months and if above work-up is negative may consider trial of Aricept-like medications in the future.  We also discussed memory compensation strategies.  Continue Midodrin for her orthostatic hypotension which appears to have responded to it.  May consider Northera in the future if Midodrin is not effective I advised she do orthostatic tolerance exercises before she gets up.  She was advised to continue to use a walker at all times for ambulation for fall and safety precautions.  She will return for follow-up in 2 months or call earlier if necessary. Memory Compensation Strategies  1. Use "WARM" strategy.  W= write it down  A= associate it  R= repeat it  M= make a mental note  2.   You can keep a Social worker.  Use a 3-ring notebook with sections for the following: calendar, important names and phone numbers,  medications, doctors' names/phone numbers, lists/reminders, and a section to journal what you did  each day.   3.    Use a calendar to write appointments down.  4.    Write yourself a schedule for the day.  This can be placed on the calendar or in a separate section of the Memory Notebook.  Keeping a  regular schedule can help memory.  5.    Use medication organizer with sections for each day or morning/evening pills.  You may need help loading it  6.    Keep a basket, or pegboard by the door.  Place items that you need to take out with you in the basket or on the pegboard.  You may also want to  include a message board for reminders.  7.     Use sticky notes.  Place sticky notes with reminders in a place where the task is performed.  For example: " turn off the  stove" placed by the stove, "lock the door" placed on the door at eye level, " take your medications" on  the bathroom mirror or by the place where you normally take your medications.  8.    Use alarms/timers.  Use while cooking to remind yourself to check on food or as a reminder to take your medicine, or as a  reminder to make a call, or as a reminder to perform another task, etc.

## 2017-12-12 NOTE — Progress Notes (Signed)
PATIENT: Tara Jackson DOB: 1940/05/30  REASON FOR VISIT: follow up- headache, memory loss HISTORY FROM: patient  HISTORY OF PRESENT ILLNESS: Tara Jackson is a 77 year old female with a history of headaches and memory loss. She returns today for follow-up. She reports that nortriptyline has been beneficial or her headaches. Although she still has a headache daily. She states that the headache is always on the right side of the head. She does have photophobia but denies phonophobia. Occasionally will have nausea. She states that her headache can get severe and rates her pain as a 10 out of 10 on the pain scale. She uses Excedrin for her headaches. In the past Tylenol No. 3 has been provided however the patient is not clear if she is taking this or not. The patient reports that she has been taking one tablet of nortriptyline however the prescription was written for 2 tablets at bedtime. Patient denies any new symptoms. She returns today for an evaluation.  HISTORY 04/07/15: Tara Jackson is a 77 year old female with a history of headaches, TIA and memory loss. She returns today for follow-up. She reports that today she has a headache. She states that it started approximately 1 month ago and has been daily in nature. She rates her pain a 10 out of 10 today. She describes her discomfort as pulsatile in the temporal region. She does describe a stiff neck. Denies nausea and vomiting. Does have some photophobia. Denies phonophobia. She has tried tramadol 100 mg with no relief. In the past the patient has been on tizanidine (2014) and according to the notes she received good benefit from this medication. The patient does not recall this medication and is unsure why she is no longer taking it-- other than her prescription may have ran out. The patient does feel that her memory is worse. She states that last night she did not remember eating supper. The patient is able to complete most ADLs independently. Although she  requires assistance with putting on her shoes and stockings. Her husband also picks out her clothes for her. She no longer does much cooking because she feels dizzy at times. She does not operate a motor vehicle. She continues to do the finances however last month she had some complications however she is going to try again this month. The patient is not currently on any medication for her memory. Her BuSpar was increased at the last visit however the patient is not aware of this. Her husband is with her today at the visit. 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  HISTORY: Tara Jackson is a 77 year old African lady who developed new-onset chronic daily headaches for the last 4 weeks. She states that she had some dental extractions done of her upper molar teeth about a week ago following which she noticed worsening of her pain which predominantly is in the temporal head regions but does spread all over. She states the pain is constant, severe. At times it is pulsatile. She was seen in the emergency room at [facility] on 07/23/12. She was treated with IV Dilaudid, Phenergan, Zofran and Benadryl in no hopping on the subtotal benefit. CT scan of the maxillary region showed no evidence of abscess associated with a total extraction but showed postoperative changes with communication between the oral cavity and nasal maxillary sinus. There was evidence of sinusitis seen. CT scan of the head showed no acute abnormality. The headache is accompanied by some  nausea and light sensitivity and occasional vomiting. The headache remains unchanged throughout the day. She is currently being treated with antibiotic course for a swollen gums. She has tried oxycodone, Ultram and Tylenol which have not helped. She does also have history of degenerative cervical spine disease she does complain of neck pain and muscle tightness and spasm as well. She has had 4 minor falls since her last visit 2  months ago. She even had a brief episode of blacking out yesterday but her husband caught her. She found that her blood pressure was low and her primary care physician has reduced her blood pressure medication since then. She denies any prior history of migraine headaches of similar headaches in the past. She denies any blurred vision or loss of vision accompanying her headaches. 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   UPDATE 11/15/12 (LL): Tara Jackson comes to office for revisit for daily headache after wisdom tooth extraction. MRI cervical spine shows prominent spondylitic changes mostly at C5-6 and C4-5. with lateral disc osteophyte protrusions resulting in moderate foraminal narrowing on the right and mild canal stenosis without definite compression. MRI brain showed mild changes of microvascular ischemia. Advanced changes of chronic paranasal sinusitis. She states that her headaches are much better, about 84 % better than last visit. She does not have a headache today. She is using Tizanadine 4 mg for neck muscle tension. She had a recent sleep study which showed snoring but AHI within normal limits. Restless legs syndrome was suggested due to frequent leg movements. She states at night sometimes she feels like something is crawling on her lower legs and she feels like she must move them.   UPDATE 09/19/13 (LL): Since last visit Tara Jackson states that she has had some problems with abdominal pain and constipation. She recently went to the ER with abdominal pain and constipation times one week, despite the use of several enemas. Her headaches have not been bad in the last few months. She had a epidural pain injection in May at pain management. She denies any focal TIA or stroke symptoms, but has numerous symptom complaints on review of systems. She is concerned about leg swelling and abdominal swelling,  although her weight is up another 21 pounds since last visit. She states she has been having frequent falls without injury, but when she falls it's difficult for her to get up by herself and even difficult for her husband to get her up. She does not exercise and uses a motorized wheelchair his most of the time. Of particular concern today is an increase in hand tremor right greater than left, which is present at rest but more intense with action. It is causing her embarrassment. Update 06/03/2014 : She returns for follow-up after last visit 6 months ago. She is accompanied by husband. Patient reports new complaint of memory difficulties for the last 3 months. She states that this is mainly short-term memory. She cannot remember recent events and conversations and needs to be reminded multiple times. She often asked the same questions when she was told to answer a short while back. At times she feels that she has trouble completing sentences and can stop in mid sentence and search for what she was trying to say. She has had some episodes of hypoglycemia but feels her memory difficulties and are not related to that. She  was prescribed primidone at last visit for tremor but she did not fill the prescription but she remains on Inderal 40 mg which seems to help her tremor quite well. She was hospitalized about 4-6 weeks ago with chest pain and all workup was negative except heart rate was slow. She has long-standing history of depression and does take BuSpar 7.5 mg daily as well as Klonopin but feels her depression is not adequately treated. She has had no recurrent stroke or TIA symptoms. She remains on clopidogrel which is tolerating well without significant bleeding or bruising. She states her blood pressure and cholesterol control have been good Update 04/06/2015 : She returns for follow-up after last visit 6 months ago. She is accompanied by her husband. Patient states that in memory diminished slightly worse and  she has trouble remembering some recent information. However she can still manage to take care of her daily activities by herself. She was recently hospitalized 2 weeks ago and had a pacemaker implanted by Dr. Lovena Le. She continues to have daily headaches but off and on and not constant. The headaches vary in severity from 6/10-10/10. She takes Tylenol which seems to help. On average needs to 3 tablets a day at least. She needs to take tramadol occasionally which doesn't work as well. She has been taking Pamelor 50 mg capsule at night for her remote history of anxiety attacks. She states her blood pressure is well controlled and today it is low at 103/61. She is having trouble controlling her sugars which have fluctuated a bit she has not had any recurrent stroke or TIA symptoms. She remains on Plavix which is tolerating well without bleeding or bruising.  Update 01/12/2016 : She returns for follow-up after last visit 9 months ago. She is accompanied by husband. Patient states that she had a minor fall about 3 weeks ago and slipped and her head hit a car door. Since then she's been having daily headaches. She describes this as mainly posterior headaches which are mild to moderate in nature. She had been asked to increase her dose of Pamelor to 100 mg at night and last visit but she has not done that. She also admits to having some cognitive difficulties with decreased attention and poor short-term memory and at times having trouble remembering names and completing sentences. She has a lifelong history of depression and the past has tried multiple medications including Zoloft and Wellbutrin and Paxil but currently she is only on Klonopin which she takes for anxiety. She does participate in some stress relaxation activities like meditation daily but is not doing neck stretching exercises.She is on klonopin for anxiety but not on anything for depression. Update 12/01/2016 : she returns for follow-up after last visit  10 months ago. She is accompanied by her husband. Patient is complaining of increasing falls. She's fallen 3 times in gone to the emergency room and once she did not go She states she falls mostly backwards. She falls without warning. One fall she actually fell forwards.she has no warning and this happened suddenly and she cannot stop herself. She does not lose consciousness. She denies any chest pain and palpitations breaking out into sweats or blurred or loss of vision prior to his episodes. She was seen in the emergency room and [id number] and CT scan of the head that showed no acute abnormality. The tingling numbness in her feet which is occasionally they as well. Denies any weakness in her feet. Complains of long-standing tremors in her hands  occasionally. Past has taken propranolol which hashelped quite well.she denies any drooling of saliva, increased facial expressions short stepped gait. Or freezing while walking. Update 03/07/2017 : she returns for follow-up after last visit 3 months ago. She is accompanied by husband. She states she is doing well and she has had no further falls since her last visit with me. She did undergo EMG nerve conduction study done by Dr. 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 Carotid ultrasound done in Dr. Lianne Moris office showed no significant extra-cranial stenosis. Transcranial Doppler study showed low mean flow velocities throughout with increased pulsatility indexes likely due to diffuse intracranial atherosclerosis. Patient states that she does walk a little bit with a walker but needs her husband to be close to her so she does not fall. She does have some burning in her feet. She takes Pamelor at night as well as tramadol when necessary as needed. She remains on Plavix which is tolerating well without bleeding or bruising. She states her blood sugars are under good control. Her blood pressure normally is quite good but today it is  elevated at 151/78. Update 12/11/2017 ; she returns for follow-up after last visit 9 months ago.  She is accompanied by her husband.  Patient states that she has noted subjective worsening of her memory.  She has had trouble remembering names and more recently telephone numbers which were well-known to her.  She denies feeling depressed.  She has had no falls or injuries.  She denies any headaches.  She was having significant symptoms of orthostatic dizziness and Dr. Virgina Jock recently started her on Midodrin which seems to be helping.  She was seen today in his office and had lab work which showed significantly suppressed TSH of level 0.02.  She is on Synthroid 100 mg daily.  She was admitted in August 2019 with a fall to [facility].  She was discharged home with home physical therapy which she finished ~end of September.  She walks mostly endorsed using a walker but needs some help to get up and has to hold onto the husband until she feels better.  Her lab work from today shows serum creatinine of 2.0.  Hemoglobin A1c of 7.5 which actually is down from 7.9 from August.  She complains of diminished appetite and in fact has lost weight.  She has no family history of dementia.   REVIEW OF SYSTEMS: Out of a complete 14 system review of symptoms, the patient complains only of the following symptoms, and all other reviewed systems are negative. Gait difficulty, imbalance, dizziness, leg swelling, palpitations,  , itching, weakness, memory loss, anemiaand all other systems negative ALLERGIES: Allergies  Allergen Reactions  . Iodine Other (See Comments)    ONLY IV--Makes unconscious  . Iohexol Other (See Comments)     Code: SOB, Desc: cardiac arrest w/ iv contrast, has never used 13 hr prep//alice calhoun, Onset Date: [id number]   . Metoclopramide Hcl Other (See Comments)    suicidal  . Sulfa Antibiotics Hives  . Lactose Intolerance (Gi) Diarrhea  . Lansoprazole Nausea And Vomiting    HOME  MEDICATIONS: Outpatient Medications Prior to Visit  Medication Sig Dispense Refill  . acetaminophen (TYLENOL) 500 MG tablet Take 2 tablets (1,000 mg total) by mouth every 6 (six) hours as needed. 30 tablet 0  . albuterol (PROVENTIL HFA;VENTOLIN HFA) 108 (90 BASE) MCG/ACT inhaler Inhale 2 puffs into the lungs every 6 (six) hours as needed for wheezing.    Marland Kitchen  bisacodyl (FLEET) 10 MG/30ML ENEM Place 10 mg rectally once.    . busPIRone (BUSPAR) 10 MG tablet Take 1 tablet (10 mg total) by mouth every morning. 60 tablet 1  . Cholecalciferol (VITAMIN D-3 PO) Take 2 tablets by mouth every morning.     . clonazePAM (KLONOPIN) 0.5 MG tablet Take 0.5-1 mg by mouth See admin instructions. Takes 2 tabs in am and 1 tab in pm.    . clopidogrel (PLAVIX) 75 MG tablet Take 75 mg by mouth every morning.    . dicyclomine (BENTYL) 10 MG capsule Take 1 capsule (10 mg total) by mouth 3 (three) times daily before meals.    Marland Kitchen DM-APAP-CPM (VICKS FORMULA 44 COUGH/COLD PM PO) Take by mouth. Used 1/2 cup    . furosemide (LASIX) 20 MG tablet Take 1 tablet (20 mg total) by mouth daily as needed for fluid or edema. 90 tablet 3  . glipiZIDE (GLUCOTROL) 10 MG tablet Take 20 mg by mouth 2 (two) times daily before a meal.     . guaiFENesin (MUCINEX) 600 MG 12 hr tablet Take 600 mg by mouth 2 (two) times daily as needed for cough.    . insulin NPH (HUMULIN N,NOVOLIN N) 100 UNIT/ML injection Inject 0-20 Units into the skin See admin instructions. Novolin-N Give 20 unit in morning; give  5 units at 4pm; evening dose as needed.  If less than 150 blood sugar= 0units.  If greater than 200 blood sugar= give 10units to decrease by 100 blood sugar    . insulin regular (NOVOLIN R,HUMULIN R) 100 units/mL injection Inject 0-25 Units into the skin See admin instructions. Give 25 unit in morning; give 20units at 4pm; evening dose as needed.  If less than 150 blood sugar= 0units.  If greater than 200 blood sugar= give 10units to decrease by 100 blood  sugar    . ipratropium (ATROVENT) 0.03 % nasal spray Place 2 sprays into both nostrils every 12 (twelve) hours. (Patient taking differently: Place 2 sprays into both nostrils every 12 (twelve) hours as needed for rhinitis. ) 30 mL 0  . levothyroxine (SYNTHROID, LEVOTHROID) 100 MCG tablet Take 100 mcg by mouth every morning.     . lidocaine (LIDODERM) 5 % Place 3 patches onto the skin daily as needed (pain). Remove & Discard patch within 12 hours or as directed by MD    . linaclotide (LINZESS) 290 MCG CAPS capsule Take 290 mcg by mouth daily before breakfast.    . LORazepam (ATIVAN) 0.5 MG tablet Take 0.5 mg by mouth daily as needed for anxiety.    . magnesium 30 MG tablet Take 30 mg by mouth daily.    . midodrine (PROAMATINE) 2.5 MG tablet Take 1 tablet (2.5 mg total) by mouth 3 (three) times daily with meals. 90 tablet 6  . nortriptyline (PAMELOR) 50 MG capsule TAKE 2 CAPSULES (100MG ) AT BEDTIME (Patient taking differently: TAKE 1 CAPSULES (100MG ) AT two times daily) 180 capsule 1  . ondansetron (ZOFRAN) 4 MG tablet Take 4 mg by mouth as needed.  0  . OXYGEN Inhale 2 L into the lungs as needed. Used at overnight with a concentrator     . polyethylene glycol (MIRALAX / GLYCOLAX) packet Take 17 g by mouth daily as needed for mild constipation.     . potassium chloride SA (K-DUR,KLOR-CON) 20 MEQ tablet Take 1 tablet (20 mEq total) by mouth 2 (two) times daily. 10 tablet 0  . rosuvastatin (CRESTOR) 20 MG tablet Take 20  mg by mouth daily.     . solifenacin (VESICARE) 10 MG tablet Take 10 mg by mouth daily as needed (overactive bladder).     Marland Kitchen terconazole (TERAZOL 7) 0.4 % vaginal cream Place 1 applicator vaginally at bedtime as needed (for yeast infection).     . traMADol (ULTRAM) 50 MG tablet Take 1 tablet (50 mg total) by mouth every 12 (twelve) hours as needed for moderate pain. (Patient taking differently: Take 50 mg by mouth 2 (two) times daily as needed for moderate pain. ) 30 tablet 0  . vitamin  B-12 (CYANOCOBALAMIN) 500 MCG tablet Take 1 tablet (500 mcg total) by mouth daily. 30 tablet 0   No facility-administered medications prior to visit.     PAST MEDICAL HISTORY: Past Medical History:  Diagnosis Date  . Anemia   . Anxiety   . Arthritis   . Asthma   . Brittle bone disease   . Cataract   . CKD (chronic kidney disease)   . Complication of anesthesia    hard to wake up after anesthesia, trouble turning head  . Coronary artery disease, non-occlusive 2014   a. minimal CAD by cath in 2014 with 40% RI stenosis. b. low-risk NST in 11/2014.  Marland Kitchen Depression   . Diabetes mellitus type 2, insulin dependent ([place])   . Diabetic neuropathy ([place])   . Diastolic dysfunction, left ventricle 11/30/14   EF 55%, grade 1 DD  . DVT (deep venous thrombosis) ([place])    "18 in RLE; 7 LLE prior to PE" (03/24/2015)  . Family history of adverse reaction to anesthesia    " the whole family is hard to wake up"  . Glaucoma   . H/O hiatal hernia   . Hyperlipidemia   . Hypertension associated with diabetes ([place])   . Hypothyroidism   . Kidney stones    "passed them"  . Memory loss 06/03/2014  . On home oxygen therapy    "2L oxygen concentrator @ night"  . Osteoporosis   . Oxygen desaturation during sleep    wears 2 liters of oxygen at night   . Palpitations 35years ago   Cardionet monitor - revealed mostly normal sinus rhythm, sinus bradycardia with first-degree A-V block heart rates mostly in the 50s and 60s with some 70s. No arrhythmias, PVCs or PACs noted.  . PE (pulmonary thromboembolism) ([place]) x 3, last one was ~ 2003   history of recurrent RLE DVT with PE - last PE ~>13 yrs; Maintained on Plavix  . Pneumonia   . Presence of permanent cardiac pacemaker   . Restless legs syndrome   . Sleep apnea    cpap disontinued; test done 07/14/2008 ordered per  [facility]rum  . Spinal headache   . Spinal stenosis    L 3, L 4 and L 5, and C 1, C 2 and C3  . Stroke [facility]) 04-19-2013   tia and stroke. Weakness Rt  hand  . 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 s/p [employer] PPM in 03/2015 by Dr. Lovena Le secondary to sinus node dysfunction.   Marland Kitchen TIA (transient ischemic attack) March 2014    PAST SURGICAL HISTORY: Past Surgical History:  Procedure Laterality Date  . ABDOMINAL HYSTERECTOMY    . APPENDECTOMY    . BACK SURGERY     steroid inj  . CATARACT EXTRACTION W/ INTRAOCULAR LENS  IMPLANT, BILATERAL Bilateral 2000s  . CHOLECYSTECTOMY    . EP IMPLANTABLE DEVICE N/A 03/24/2015   [facility] PPM, Dr. Lovena Le  .  ESOPHAGOGASTRODUODENOSCOPY  08/09/2011   Procedure: ESOPHAGOGASTRODUODENOSCOPY (EGD);  Surgeon: Jeryl Columbia, MD;  Location: [facility] ENDOSCOPY;  Service: Endoscopy;  Laterality: N/A;  . ESOPHAGOGASTRODUODENOSCOPY (EGD) WITH PROPOFOL N/A 11/12/2013   Procedure: ESOPHAGOGASTRODUODENOSCOPY (EGD) WITH PROPOFOL;  Surgeon: Jeryl Columbia, MD;  Location: [facility] ENDOSCOPY;  Service: Endoscopy;  Laterality: N/A;  . ESOPHAGOGASTRODUODENOSCOPY (EGD) WITH PROPOFOL N/A 05/05/2016   Procedure: ESOPHAGOGASTRODUODENOSCOPY (EGD) WITH PROPOFOL;  Surgeon: Clarene Essex, MD;  Location: [facility] ENDOSCOPY;  Service: Endoscopy;  Laterality: N/A;  . GLAUCOMA SURGERY Bilateral 2000s   "laser"  . HOT HEMOSTASIS  08/09/2011   Procedure: HOT HEMOSTASIS (ARGON PLASMA COAGULATION/BICAP);  Surgeon: Jeryl Columbia, MD;  Location: [facility];  Service: Endoscopy;  Laterality: N/A;  . HOT HEMOSTASIS N/A 11/12/2013   Procedure: HOT HEMOSTASIS (ARGON PLASMA COAGULATION/BICAP);  Surgeon: Jeryl Columbia, MD;  Location: [facility] ENDOS[facility];  Service: Endoscopy;  Laterality: N/A;  . INSERT / REPLACE / REMOVE PACEMAKER  03/24/2015  . JOINT REPLACEMENT    . KNEE SURGERY Left done with 2nd left knee replacement   spacer bar placement  . LEFT HEART CATHETERIZATION WITH CORONARY ANGIOGRAM N/A 02/14/2012   Procedure: LEFT HEART CATHETERIZATION WITH CORONARY ANGIOGRAM;  Surgeon: Leonie Man, MD;  Location: [facility] CATH LAB;  Service: Cardiovascular:: no evidence  of obstructive coronary disease ,low EDP with normal EF  . LEV DOPPLER  05/29/2012   RIGHT EXTREM. NORMAL VENOUS DUPLEX  . NM MYOCAR PERF WALL MOTION  01/26/2012; 11/2014   a. EF 79%,LV normal,ST SEGMENT CHANGE SUGGESTIVE OF ISCHEMIA.; LOW RISK, EF 60%. No ischemia or infarction. No wall motion abnormality   . PACEMAKER PLACEMENT    . REVISION TOTAL KNEE ARTHROPLASTY Left   . TONSILLECTOMY    . TOTAL KNEE ARTHROPLASTY Bilateral   . TRANSTHORACIC ECHOCARDIOGRAM  10/2016   EF 65-70%.  Vigorous LV function.  Mild LVH.  Poor acoustic windows.  Cannot assess diastolic function or significant valvular dysfunction.  Domingo Dimes ECHOCARDIOGRAM  2014; October 2016   a. EF 47-42%; normal diastolic pressures, no regional wall motion motion abnormalities;; b/ F 55-60%. Normal wall motion. GR 1 DD. No valve lesions  . TRIGGER FINGER RELEASE  11/22/2011   Procedure: RELEASE TRIGGER FINGER/A-1 PULLEY;  Surgeon: Meredith Pel, MD;  Location: [facility];  Service: Orthopedics;  Laterality: Left;  Left trigger thumb release  . TRIGGER FINGER RELEASE Right yrs ago    FAMILY HISTORY: Family History  Problem Relation Age of Onset  . Cancer Mother   . Cancer - Prostate Father     SOCIAL HISTORY: Social History   Socioeconomic History  . Marital status: Married    Spouse name: [name]  . Number of children: 2  . Years of education: college  . Highest education level: Not on file  Occupational History  . Occupation: retired  Scientific laboratory technician  . Financial resource strain: Not on file  . Food insecurity:    Worry: Not on file    Inability: Not on file  . Transportation needs:    Medical: Not on file    Non-medical: Not on file  Tobacco Use  . Smoking status: Never Smoker  . Smokeless tobacco: Never Used  Substance and Sexual Activity  . Alcohol use: No    Alcohol/week: 0.0 standard drinks  . Drug use: No  . Sexual activity: Yes  Lifestyle  . Physical activity:    Days per week: Not on file      Minutes per session: Not on file  .  Stress: Not on file  Relationships  . Social connections:    Talks on phone: Not on file    Gets together: Not on file    Attends religious service: Not on file    Active member of club or organization: Not on file    Attends meetings of clubs or organizations: Not on file    Relationship status: Not on file  . Intimate partner violence:    Fear of current or ex partner: Not on file    Emotionally abused: Not on file    Physically abused: Not on file    Forced sexual activity: Not on file  Other Topics Concern  . Not on file  Social History Narrative   Married [name].) married 24 years.   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       PHYSICAL EXAM  Vitals:   12/12/17 1545  BP: (!) 155/73  Pulse: 65  Weight: 252 lb 9.6 oz (114.6 kg)  Height: 5' 7.5" (1.715 m)   Body mass index is 38.98 kg/m. MMSE - Mini Mental State Exam 04/12/2016 07/09/2015 10/07/2014  Orientation to time 5 5 5   Orientation to Place 5 5 5   Registration 3 3 3   Attention/ Calculation 5 5 5   Recall 3 2 3   Language- name 2 objects 2 2 2   Language- repeat 1 1 1   Language- follow 3 step command 3 3 2   Language- read & follow direction 1 1 1   Write a sentence 1 1 1   Copy design 1 1 1   Total score 30 29 29     Generalized: Well developed obese elderly African-American lady, in no acute distress,   Neurological examination  Mentation: Alert oriented to time, place, history taking. Follows all commands speech and language fluent.  Glabellar tap absent.  Diminished recall 2/3.  Animal naming test 6 only.  Clock drawing 3/4. Cranial nerve II-XII: Pupils were equal round reactive to light. Extraocular movements were full, visual field were full on confrontational test. Facial sensation and strength were normal. Uvula tongue midline. Head turning and shoulder shrug  were normal and symmetric. Motor: The motor testing reveals 5 over 5  strength LUE, LLE, RLE. 4/5 in RUE. Good symmetric motor tone is noted throughout.  Sensory: Sensory testing is intact to soft touch on all 4 extremities. No evidence of extinction is noted. Diminished vibration sensation of her ankles and toes bilaterally. Coordination: Cerebellar testing reveals good finger-nose-finger and heel-to-shin bilaterally.  Gait and station: Patient is in a wheelchair.needs two-person help to get up. Walks with a cautious broad based gait Reflexes: Deep tendon reflexes are symmetric and Depressed bilaterally.   DIAGNOSTIC DATA (LABS, IMAGING, TESTING) - I reviewed patient records, labs, notes, testing and imaging myself where available.  Lab Results  Component Value Date   WBC 5.2 09/14/2017   HGB 9.5 (L) 09/14/2017   HCT 30.2 (L) 09/14/2017   MCV 101.0 (H) 09/14/2017   PLT 207 09/14/2017      Component Value Date/Time   NA 140 09/14/2017 0421   K 4.0 09/14/2017 0421   CL 104 09/14/2017 0421   CO2 24 09/14/2017 0421   GLUCOSE 239 (H) 09/14/2017 0421   BUN 19 09/14/2017 0421   CREATININE 1.66 (H) 09/14/2017 0421   CREATININE 1.64 (H) 04/09/2015 1438   CALCIUM 8.5 (L) 09/14/2017 0421   PROT 6.4 (L) 09/12/2017 1215   PROT 7.0 03/07/2017 1531   ALBUMIN 3.4 (  L) 09/12/2017 1215   AST 22 09/12/2017 1215   ALT 12 09/12/2017 1215   ALKPHOS 66 09/12/2017 1215   BILITOT 0.6 09/12/2017 1215   GFRNONAA 28 (L) 09/14/2017 0421   GFRAA 33 (L) 09/14/2017 0421    Lab Results  Component Value Date   HGBA1C 7.9 (H) 03/07/2017   Lab Results  Component Value Date   VITAMINB12 282 09/13/2017   Lab Results  Component Value Date   TSH 4.194 09/12/2017      ASSESSMENT AND PLAN 77 y.o. year old female  has a past medical history of Anemia, Anxiety, Arthritis, Asthma, Brittle bone disease, Cataract, CKD (chronic kidney disease), Complication of anesthesia, Coronary artery disease, non-occlusive (2014), Depression, Diabetes mellitus type 2, insulin dependent  ([place]), Diabetic neuropathy ([place]), Diastolic dysfunction, left ventricle (11/30/14), DVT (deep venous thrombosis) ([place]), Family history of adverse reaction to anesthesia, Glaucoma, H/O hiatal hernia, Hyperlipidemia, Hypertension associated with diabetes ([place]), Hypothyroidism, Kidney stones, Memory loss (06/03/2014), On home oxygen therapy, Osteoporosis, Oxygen desaturation during sleep, Palpitations (35years ago), PE (pulmonary thromboembolism) ([place]) (x 3, last one was ~ 2003), Pneumonia, Presence of permanent cardiac pacemaker, Restless legs syndrome, Sleep apnea, Spinal headache, Spinal stenosis, Stroke ([place]) (04-19-2013), Symptomatic bradycardia (03/24/2015), and TIA (transient ischemic attack) (March 2014). here with: New complaints of memory difficulties due to mild cognitive impairment.  Also persistent orthostasis due to her diabetic peripheral neuropathy  I had a long discussion with the patient and her husband regarding her new complaints of subjective memory difficulties which likely represent mild cognitive impairment.  I recommend she discuss with Dr. Virgina Jock reduction in the dose of Synthroid since that TSH appears to be significantly suppressed.  Check vitamin B12, RPR, EEG and MRI scan of the brain to look for other reversible conditions.  If her complaints persists upon follow-up visit in 2 months and if above work-up is negative may consider trial of Aricept-like medications in the future.  We also discussed memory compensation strategies.  Continue Midodrin for her orthostatic hypotension which appears to have responded to it.  May consider Northera in the future if Midodrin is not effective I advised she do orthostatic tolerance exercises before she gets up.  She was advised to continue to use a walker at all times for ambulation for fall and safety precautions.  She will return for follow-up in 2 months or call earlier if necessary.  Antony Contras, MD 12/12/2017, 5:18 PM [facility] [address] [place], [place] [address] [phone]

## 2017-12-13 ENCOUNTER — Telehealth: Payer: Self-pay | Admitting: Cardiology

## 2017-12-13 ENCOUNTER — Telehealth: Payer: Self-pay | Admitting: Neurology

## 2017-12-13 NOTE — Telephone Encounter (Signed)
[employer] medicare order sent to [facility]. They obtain the auth and will reach out to the pt to schedule.  °

## 2017-12-13 NOTE — Telephone Encounter (Signed)
Received records from [facility] on 12/13/17, Appt 01/26/18 @ 8:00AM. [place]

## 2017-12-14 ENCOUNTER — Ambulatory Visit (INDEPENDENT_AMBULATORY_CARE_PROVIDER_SITE_OTHER): Payer: Medicare HMO | Admitting: *Deleted

## 2017-12-14 DIAGNOSIS — I495 Sick sinus syndrome: Secondary | ICD-10-CM

## 2017-12-14 NOTE — Progress Notes (Signed)
Remote pacemaker transmission.   

## 2017-12-14 NOTE — Telephone Encounter (Signed)
Noted, patient is aware. 

## 2017-12-14 NOTE — Telephone Encounter (Signed)
Patient is schedule to have her CT done on 12/21/17 but she is not able to have the MRI at [facility] due to she has a Psychologist, forensic.. Do you have any information on the pacemaker. Her serial #, model  #, year,and the make.

## 2017-12-14 NOTE — Telephone Encounter (Signed)
Ok to cancel mri and do CT brain alone

## 2017-12-17 ENCOUNTER — Encounter: Payer: Self-pay | Admitting: Cardiology

## 2017-12-18 DIAGNOSIS — H35033 Hypertensive retinopathy, bilateral: Secondary | ICD-10-CM | POA: Diagnosis not present

## 2017-12-18 DIAGNOSIS — H35722 Serous detachment of retinal pigment epithelium, left eye: Secondary | ICD-10-CM | POA: Diagnosis not present

## 2017-12-18 DIAGNOSIS — H401131 Primary open-angle glaucoma, bilateral, mild stage: Secondary | ICD-10-CM | POA: Diagnosis not present

## 2017-12-18 DIAGNOSIS — E113293 Type 2 diabetes mellitus with mild nonproliferative diabetic retinopathy without macular edema, bilateral: Secondary | ICD-10-CM | POA: Diagnosis not present

## 2017-12-19 NOTE — Telephone Encounter (Signed)
Tara Jackson: [id number] (exp. 12/18/17 to 03/18/18) patient is scheduled at [facility] for 12/21/17.

## 2017-12-21 ENCOUNTER — Ambulatory Visit
Admission: RE | Admit: 2017-12-21 | Discharge: 2017-12-21 | Disposition: A | Discharge status: Home / Self Care | Payer: Medicare HMO | Source: Ambulatory Visit | Attending: Neurology | Admitting: Neurology

## 2017-12-21 DIAGNOSIS — R413 Other amnesia: Secondary | ICD-10-CM

## 2017-12-22 DIAGNOSIS — I10 Essential (primary) hypertension: Secondary | ICD-10-CM | POA: Diagnosis not present

## 2017-12-22 DIAGNOSIS — I509 Heart failure, unspecified: Secondary | ICD-10-CM | POA: Diagnosis not present

## 2017-12-25 NOTE — Progress Notes (Addendum)
[facility] Note  12/26/2017     CHIEF COMPLAINT Patient presents for Retina Evaluation   HISTORY OF PRESENT ILLNESS: Tara Jackson is a 77 y.o. female who presents to the clinic today for:   HPI    Retina Evaluation    In left eye.  Onset: unkown.  Duration: unknown.  Associated Symptoms Flashes.  Negative for Blind Spot, Photophobia, Scalp Tenderness, Fever, Floaters, Pain, Glare, Jaw Claudication, Weight Loss, Distortion, Redness, Trauma, Shoulder/Hip pain and Fatigue.  Context:  watching TV.  Treatments tried include no treatments.  Response to treatment was no improvement.  I, the attending physician,  performed the HPI with the patient and updated documentation appropriately.          Comments    Patient referred by Dr. Herbert Deaner for retinal eval. Patient has been diabetic for 37 years. BS was 180 last night and 154 yesterday am. Last A1c was 7.0 on 10/17/2017.  Patient c/o TV blurry at times. Vision is double sometimes but not always. Patient sees flashes OD when watching TV. Denies headache, nausea, or vomiting. History of xalatan use for IOP but D/C'd per Dr. Herbert Deaner.        Last edited by Bernarda Caffey, MD on 12/26/2017 11:11 AM. (History)     Patient c/o intermittent diplopia when watching TV and lying on side. Referring physician: Monna Fam, MD [place], [place] [address]  HISTORICAL INFORMATION:   Selected notes from the MEDICAL RECORD NUMBER Referred by Dr. Monna Fam for concern of unusual large drusenoid, epi detachement LEE: 11.11.19 (K. Hecker) [BCVA: OD: 20/25++ OS: 20/20--] Ocular Hx-cataracts OU, glaucoma OU PMH-anemia, anxiety, arthritis, asthma, CKD, CAD, depression, DM(takes Novolin and glipizide), HLD, HTN,stroke    CURRENT MEDICATIONS: No current outpatient medications on file. (Ophthalmic Drugs)   No current facility-administered medications for this visit.  (Ophthalmic Drugs)   Current  Outpatient Medications (Other)  Medication Sig  . acetaminophen (TYLENOL) 500 MG tablet Take 2 tablets (1,000 mg total) by mouth every 6 (six) hours as needed.  Marland Kitchen albuterol (PROVENTIL HFA;VENTOLIN HFA) 108 (90 BASE) MCG/ACT inhaler Inhale 2 puffs into the lungs every 6 (six) hours as needed for wheezing.  . busPIRone (BUSPAR) 10 MG tablet Take 1 tablet (10 mg total) by mouth every morning.  . Cholecalciferol (VITAMIN D-3 PO) Take 2 tablets by mouth every morning.   . clonazePAM (KLONOPIN) 0.5 MG tablet Take 0.5-1 mg by mouth See admin instructions. Takes 2 tabs in am and 1 tab in pm.  . clopidogrel (PLAVIX) 75 MG tablet Take 75 mg by mouth every morning.  . dicyclomine (BENTYL) 10 MG capsule Take 1 capsule (10 mg total) by mouth 3 (three) times daily before meals.  Marland Kitchen DM-APAP-CPM (VICKS FORMULA 44 COUGH/COLD PM PO) Take by mouth. Used 1/2 cup  . glipiZIDE (GLUCOTROL) 10 MG tablet Take 20 mg by mouth 2 (two) times daily before a meal.   . guaiFENesin (MUCINEX) 600 MG 12 hr tablet Take 600 mg by mouth 2 (two) times daily as needed for cough.  . insulin NPH (HUMULIN N,NOVOLIN N) 100 UNIT/ML injection Inject 0-20 Units into the skin See admin instructions. Novolin-N Give 20 unit in morning; give  5 units at 4pm; evening dose as needed.  If less than 150 blood sugar= 0units.  If greater than 200 blood sugar= give 10units to decrease by 100 blood sugar  . insulin regular (NOVOLIN R,HUMULIN R) 100 units/mL injection Inject 0-25  Units into the skin See admin instructions. Give 25 unit in morning; give 20units at 4pm; evening dose as needed.  If less than 150 blood sugar= 0units.  If greater than 200 blood sugar= give 10units to decrease by 100 blood sugar  . ipratropium (ATROVENT) 0.03 % nasal spray Place 2 sprays into both nostrils every 12 (twelve) hours. (Patient taking differently: Place 2 sprays into both nostrils every 12 (twelve) hours as needed for rhinitis. )  . levothyroxine (SYNTHROID, LEVOTHROID)  100 MCG tablet Take 88 mcg by mouth every morning.   . lidocaine (LIDODERM) 5 % Place 3 patches onto the skin daily as needed (pain). Remove & Discard patch within 12 hours or as directed by MD  . linaclotide (LINZESS) 290 MCG CAPS capsule Take 290 mcg by mouth daily before breakfast.  . LORazepam (ATIVAN) 0.5 MG tablet Take 0.5 mg by mouth daily as needed for anxiety.  . magnesium 30 MG tablet Take 30 mg by mouth daily.  . nortriptyline (PAMELOR) 50 MG capsule TAKE 2 CAPSULES (100MG ) AT BEDTIME (Patient taking differently: TAKE 1 CAPSULES (100MG ) AT two times daily)  . ondansetron (ZOFRAN) 4 MG tablet Take 4 mg by mouth as needed.  . OXYGEN Inhale 2 L into the lungs as needed. Used at overnight with a concentrator   . polyethylene glycol (MIRALAX / GLYCOLAX) packet Take 17 g by mouth daily as needed for mild constipation.   . potassium chloride SA (K-DUR,KLOR-CON) 20 MEQ tablet Take 1 tablet (20 mEq total) by mouth 2 (two) times daily.  . rosuvastatin (CRESTOR) 20 MG tablet Take 20 mg by mouth daily.   . solifenacin (VESICARE) 10 MG tablet Take 10 mg by mouth daily as needed (overactive bladder).   Marland Kitchen terconazole (TERAZOL 7) 0.4 % vaginal cream Place 1 applicator vaginally at bedtime as needed (for yeast infection).   . traMADol (ULTRAM) 50 MG tablet Take 1 tablet (50 mg total) by mouth every 12 (twelve) hours as needed for moderate pain. (Patient taking differently: Take 50 mg by mouth 2 (two) times daily as needed for moderate pain. )  . vitamin B-12 (CYANOCOBALAMIN) 500 MCG tablet Take 1 tablet (500 mcg total) by mouth daily.  . bisacodyl (FLEET) 10 MG/30ML ENEM Place 10 mg rectally once.  . furosemide (LASIX) 20 MG tablet Take 1 tablet (20 mg total) by mouth daily as needed for fluid or edema.  . midodrine (PROAMATINE) 2.5 MG tablet Take 1 tablet (2.5 mg total) by mouth 3 (three) times daily with meals. (Patient not taking: Reported on 12/26/2017)   No current facility-administered medications  for this visit.  (Other)      REVIEW OF SYSTEMS: ROS    Positive for: Gastrointestinal, Neurological, Genitourinary, Musculoskeletal, Endocrine, Cardiovascular, Eyes, Respiratory   Negative for: Constitutional, Skin, HENT, Psychiatric, Allergic/Imm, Heme/Lymph   Last edited by Roselee Nova D on 12/26/2017  9:36 AM. (History)       ALLERGIES Allergies  Allergen Reactions  . Iodine Other (See Comments)    ONLY IV--Makes unconscious  . Iohexol Other (See Comments)     Code: SOB, Desc: cardiac arrest w/ iv contrast, has never used 13 hr prep//alice calhoun, Onset Date: [id number]   . Metoclopramide Hcl Other (See Comments)    suicidal  . Sulfa Antibiotics Hives  . Lactose Intolerance (Gi) Diarrhea  . Lansoprazole Nausea And Vomiting    PAST MEDICAL HISTORY Past Medical History:  Diagnosis Date  . Anemia   . Anxiety   . Arthritis   .  Asthma   . Brittle bone disease   . Cataract   . CKD (chronic kidney disease)   . Complication of anesthesia    hard to wake up after anesthesia, trouble turning head  . Coronary artery disease, non-occlusive 2014   a. minimal CAD by cath in 2014 with 40% RI stenosis. b. low-risk NST in 11/2014.  Marland Kitchen Depression   . Diabetes mellitus type 2, insulin dependent ([place])   . Diabetic neuropathy ([place])   . Diastolic dysfunction, left ventricle 11/30/14   EF 55%, grade 1 DD  . DVT (deep venous thrombosis) ([place])    "18 in RLE; 7 LLE prior to PE" (03/24/2015)  . Family history of adverse reaction to anesthesia    " the whole family is hard to wake up"  . Glaucoma   . H/O hiatal hernia   . Hyperlipidemia   . Hypertension associated with diabetes ([place])   . Hypothyroidism   . Kidney stones    "passed them"  . Memory loss 06/03/2014  . On home oxygen therapy    "2L oxygen concentrator @ night"  . Osteoporosis   . Oxygen desaturation during sleep    wears 2 liters of oxygen at night   . Palpitations 35years ago   Cardionet monitor - revealed mostly  normal sinus rhythm, sinus bradycardia with first-degree A-V block heart rates mostly in the 50s and 60s with some 70s. No arrhythmias, PVCs or PACs noted.  . PE (pulmonary thromboembolism) ([place]) x 3, last one was ~ 2003   history of recurrent RLE DVT with PE - last PE ~>13 yrs; Maintained on Plavix  . Pneumonia   . Presence of permanent cardiac pacemaker   . Restless legs syndrome   . Sleep apnea    cpap disontinued; test done 07/14/2008 ordered per  Byrum  . Spinal headache   . Spinal stenosis    L 3, L 4 and L 5, and C 1, C 2 and C3  . Stroke [facility]) 04-19-2013   tia and stroke. Weakness Rt hand  . 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 s/p [employer] PPM in 03/2015 by Dr. Lovena Le secondary to sinus node dysfunction.   Marland Kitchen TIA (transient ischemic attack) March 2014   Past Surgical History:  Procedure Laterality Date  . ABDOMINAL HYSTERECTOMY    . APPENDECTOMY    . BACK SURGERY     steroid inj  . CATARACT EXTRACTION Bilateral   . CATARACT EXTRACTION W/ INTRAOCULAR LENS  IMPLANT, BILATERAL Bilateral 2000s  . CHOLECYSTECTOMY    . EP IMPLANTABLE DEVICE N/A 03/24/2015   [facility] PPM, Dr. Lovena Le  . ESOPHAGOGASTRODUODENOSCOPY  08/09/2011   Procedure: ESOPHAGOGASTRODUODENOSCOPY (EGD);  Surgeon: Jeryl Columbia, MD;  Location: [facility];  Service: Endoscopy;  Laterality: N/A;  . ESOPHAGOGASTRODUODENOSCOPY (EGD) WITH PROPOFOL N/A 11/12/2013   Procedure: ESOPHAGOGASTRODUODENOSCOPY (EGD) WITH PROPOFOL;  Surgeon: Jeryl Columbia, MD;  Location: [facility] ENDOSCOPY;  Service: Endoscopy;  Laterality: N/A;  . ESOPHAGOGASTRODUODENOSCOPY (EGD) WITH PROPOFOL N/A 05/05/2016   Procedure: ESOPHAGOGASTRODUODENOSCOPY (EGD) WITH PROPOFOL;  Surgeon: Clarene Essex, MD;  Location: [facility] ENDOSCOPY;  Service: Endoscopy;  Laterality: N/A;  . GLAUCOMA SURGERY Bilateral 2000s   "laser"  . HOT HEMOSTASIS  08/09/2011   Procedure: HOT HEMOSTASIS (ARGON PLASMA COAGULATION/BICAP);  Surgeon: Jeryl Columbia, MD;  Location: [facility]  ENDOSCOPY;  Service: Endoscopy;  Laterality: N/A;  . HOT HEMOSTASIS N/A 11/12/2013   Procedure: HOT HEMOSTASIS (ARGON PLASMA COAGULATION/BICAP);  Surgeon: Jeryl Columbia, MD;  Location: [facility]  ENDOSCOPY;  Service: Endoscopy;  Laterality: N/A;  . INSERT / REPLACE / REMOVE PACEMAKER  03/24/2015  . IRIDOTOMY / IRIDECTOMY Bilateral   . JOINT REPLACEMENT    . KNEE SURGERY Left done with 2nd left knee replacement   spacer bar placement  . LEFT HEART CATHETERIZATION WITH CORONARY ANGIOGRAM N/A 02/14/2012   Procedure: LEFT HEART CATHETERIZATION WITH CORONARY ANGIOGRAM;  Surgeon: Leonie Man, MD;  Location: [facility] CATH LAB;  Service: Cardiovascular:: no evidence of obstructive coronary disease ,low EDP with normal EF  . LEV DOPPLER  05/29/2012   RIGHT EXTREM. NORMAL VENOUS DUPLEX  . NM MYOCAR PERF WALL MOTION  01/26/2012; 11/2014   a. EF 79%,LV normal,ST SEGMENT CHANGE SUGGESTIVE OF ISCHEMIA.; LOW RISK, EF 60%. No ischemia or infarction. No wall motion abnormality   . PACEMAKER PLACEMENT    . REVISION TOTAL KNEE ARTHROPLASTY Left   . TONSILLECTOMY    . TOTAL KNEE ARTHROPLASTY Bilateral   . TRANSTHORACIC ECHOCARDIOGRAM  10/2016   EF 65-70%.  Vigorous LV function.  Mild LVH.  Poor acoustic windows.  Cannot assess diastolic function or significant valvular dysfunction.  Domingo Dimes ECHOCARDIOGRAM  2014; October 2016   a. EF 16-10%; normal diastolic pressures, no regional wall motion motion abnormalities;; b/ F 55-60%. Normal wall motion. GR 1 DD. No valve lesions  . TRIGGER FINGER RELEASE  11/22/2011   Procedure: RELEASE TRIGGER FINGER/A-1 PULLEY;  Surgeon: Meredith Pel, MD;  Location: [facility];  Service: Orthopedics;  Laterality: Left;  Left trigger thumb release  . TRIGGER FINGER RELEASE Right yrs ago    FAMILY HISTORY Family History  Problem Relation Age of Onset  . Cancer Mother   . Diabetes Mother   . Cancer - Prostate Father   . Diabetes Father   . Retinal detachment Son   . Diabetes Sister    . Diabetes Brother   . Diabetes Paternal Grandmother     SOCIAL HISTORY Social History   Tobacco Use  . Smoking status: Never Smoker  . Smokeless tobacco: Never Used  Substance Use Topics  . Alcohol use: No    Alcohol/week: 0.0 standard drinks  . Drug use: No         OPHTHALMIC EXAM:  Base Eye Exam    Visual Acuity (Snellen - Linear)      Right Left   Dist [place] 20/25 -1 20/70 +2   Dist ph [place] NI 20/25 -1       Tonometry (Tonopen, 9:50 AM)      Right Left   Pressure 15 14       Pupils      Dark Light Shape React APD   Right 3 2 Round Slow None   Left 3 2 Round Slow None       Visual Fields (Counting fingers)      Left Right    Full Full       Extraocular Movement      Right Left    Full, Ortho Full, Ortho       Neuro/Psych    Oriented x3:  Yes   Mood/Affect:  Normal       Dilation    Both eyes:  1.0% Mydriacyl, 2.5% Phenylephrine @ 9:50 AM        Slit Lamp and Fundus Exam    Slit Lamp Exam      Right Left   Lids/Lashes  Dermatochalasis - lower lid, Dermatochalasis - upper lid, Meibomian gland dysfunction Dermatochalasis - lower lid, Dermatochalasis -  upper lid, Meibomian gland dysfunction   Conjunctiva/Sclera mild melanosis White and quiet   Cornea arcus, 1+ PEE, 2-3+ guttata, no edema arcus, well healed temporal cataract wound, 1+PEE, 2+ guttata   Anterior Chamber Deep and quiet Deep and quiet   Iris round, no NVI Round, mild atrophy inferotemporal   Lens PCIOL PC IOL   Vitreous Vitreous syneresis Vitreous syneresis       Fundus Exam      Right Left   Disc pink and sharp, central cupping pink and sharp   C/D Ratio 0.7 0.6   Macula blunted foveal reflex, RPE motltling and clumping, few MA, PED large PED nasal macula, blunted foveal reflex, scattered MA   Vessels  Vascular attenuation Vascular attenuation   Periphery attached attached        Refraction    Manifest Refraction      Sphere Cylinder Axis Dist VA   Right -0.50 sph   20/20-1    Left -1.75 +0.75 165 20/25          IMAGING AND PROCEDURES  Imaging and Procedures for @TODAY @  OCT, Retina - OU - Both Eyes       Right Eye Quality was good. Central Foveal Thickness: 209. Progression has no prior data. Findings include normal foveal contour, no IRF, no SRF, pigment epithelial detachment, epiretinal membrane, macular pucker, retinal drusen , outer retinal atrophy.   Left Eye Quality was good. Central Foveal Thickness: 204. Progression has no prior data. Findings include pigment epithelial detachment, normal foveal contour, no IRF, no SRF, retinal drusen  (Trace cystic SRF overlying large nasal PED).   Notes Diagnosis / Impression:  OD: NFP, no IRF/SRF, +focal ORA and PED, +ERM with mild pucker OS: large nasal PED with trace cystic SRF overlying PED  Clinical management:  See below  Abbreviations: NFP - Normal foveal profile. CME - cystoid macular edema. PED - pigment epithelial detachment. IRF - intraretinal fluid. SRF - subretinal fluid. EZ - ellipsoid zone. ERM - epiretinal membrane. ORA - outer retinal atrophy. ORT - outer retinal tubulation. SRHM - subretinal hyper-reflective material        Fluorescein Angiography Optos (Transit OS)       Right Eye   Progression has no prior data. Early phase findings include staining, microaneurysm. Mid/Late phase findings include microaneurysm, staining, leakage.   Left Eye   Progression has no prior data. Early phase findings include staining, microaneurysm. Mid/Late phase findings include microaneurysm, staining, leakage.   Notes Images stored on drive;   Impression: OD: mild MA's and inferior patch of hyperfluorescence staining/leakage, ? CNVM OS: scattered MA and focal patch of hyperflourescence corresponding to PED -- +CNVM                ASSESSMENT/PLAN:    ICD-10-CM   1. 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 Retinal edema H35.81 OCT, Retina - OU - Both Eyes  4. Moderate nonproliferative diabetic retinopathy of both eyes without macular edema associated with type 2 diabetes mellitus (HCC) O29.4765 Fluorescein Angiography Optos (Transit OS)  5. Essential hypertension I10   6. Hypertensive retinopathy of both eyes H35.033 Fluorescein Angiography Optos (Transit OS)  7. Pseudophakia of both eyes Z96.1     1,2. Exudative ARMD w/ PED OU (OS > OD)  - The incidence pathology and anatomy of wet AMD discussed   -  The ANCHOR, MARINA, CATT and VIEW trials discussed with patient.    - discussed treatment options including observation vs intravitreal anti-VEGF agents such as Avastin, Lucentis, Eylea.    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 Mild nonproliferative diabetic retinopathy w/o DME, both eyes - The incidence, risk factors for progression, natural history and treatment options for diabetic retinopathy were discussed with patient.   - The need for close monitoring of blood glucose, blood pressure, and serum lipids, avoiding cigarette or any type of tobacco, and the need for long term follow up was also discussed with patient. - exam with scattered MA, IRH OU - FA shows mild MA; no NV - OCT without diabetic macular edema, both eyes   5,6. Hypertensive retinopathy OU - discussed importance of tight BP control - monitor  7. 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      Return in about 4 weeks (around 01/23/2018) for DFE, OCT, ICG.  There are no Patient Instructions on file for this visit.   Explained the diagnoses, plan, and follow up with the patient and they expressed understanding.  Patient expressed understanding of the importance of proper follow up care.   This document serves as a record of services personally performed by Gardiner Sleeper, MD, PhD. It was created on their behalf by Ernest Mallick, OA, an ophthalmic assistant. The creation of this record is the provider's dictation and/or activities during the visit.    Electronically signed by: Ernest Mallick, OA  11.18.19 8:27 AM    Gardiner Sleeper, M.D., Ph.D. Diseases & Surgery of the Retina and Vitreous [facility]  I have reviewed the above documentation for accuracy and completeness, and I agree with the above. Gardiner Sleeper, M.D., Ph.D. 12/28/17 8:27 AM     Abbreviations: M myopia (nearsighted); A astigmatism; H hyperopia (farsighted); P presbyopia; Mrx spectacle prescription;  CTL contact lenses; OD right eye; OS left eye; OU both eyes  XT exotropia; ET esotropia; PEK punctate epithelial keratitis; PEE punctate epithelial erosions; DES dry eye syndrome; MGD meibomian gland dysfunction; ATs artificial tears; PFAT's preservative free artificial tears; [place] nuclear sclerotic cataract; PSC posterior subcapsular cataract; ERM epi-retinal membrane; PVD posterior vitreous detachment; RD retinal detachment; DM diabetes mellitus; DR diabetic retinopathy; NPDR non-proliferative diabetic retinopathy; PDR proliferative diabetic retinopathy; CSME clinically significant macular edema; DME diabetic macular edema; dbh dot blot hemorrhages; CWS cotton wool spot;  POAG primary open angle glaucoma; C/D cup-to-disc ratio; HVF humphrey visual field; GVF goldmann visual field; OCT optical coherence tomography; IOP intraocular pressure; BRVO Branch retinal vein occlusion; CRVO central retinal vein occlusion; CRAO central retinal artery occlusion; BRAO branch retinal artery occlusion; RT retinal tear; SB scleral buckle; PPV pars plana vitrectomy; VH Vitreous hemorrhage; PRP panretinal laser photocoagulation; IVK intravitreal kenalog;  VMT vitreomacular traction; MH Macular hole;  NVD neovascularization of the disc; NVE neovascularization elsewhere; AREDS age related eye disease study; ARMD age related macular degeneration; POAG primary open angle glaucoma; EBMD epithelial/anterior basement membrane dystrophy; ACIOL anterior chamber intraocular lens; IOL intraocular lens; PCIOL posterior chamber intraocular lens; Phaco/IOL phacoemulsification with intraocular lens placement; [place] photorefractive keratectomy; LASIK laser assisted in situ keratomileusis; HTN hypertension; DM diabetes mellitus; COPD chronic obstructive pulmonary disease

## 2017-12-26 ENCOUNTER — Telehealth: Payer: Self-pay

## 2017-12-26 ENCOUNTER — Ambulatory Visit (INDEPENDENT_AMBULATORY_CARE_PROVIDER_SITE_OTHER): Payer: Medicare HMO | Admitting: Ophthalmology

## 2017-12-26 ENCOUNTER — Encounter (INDEPENDENT_AMBULATORY_CARE_PROVIDER_SITE_OTHER): Payer: Self-pay | Admitting: Ophthalmology

## 2017-12-26 DIAGNOSIS — H353232 Exudative age-related macular degeneration, bilateral, with inactive choroidal neovascularization: Secondary | ICD-10-CM

## 2017-12-26 DIAGNOSIS — H3581 Retinal edema: Secondary | ICD-10-CM

## 2017-12-26 DIAGNOSIS — E113393 Type 2 diabetes mellitus with moderate nonproliferative diabetic retinopathy without macular edema, bilateral: Secondary | ICD-10-CM | POA: Diagnosis not present

## 2017-12-26 DIAGNOSIS — Z961 Presence of intraocular lens: Secondary | ICD-10-CM | POA: Diagnosis not present

## 2017-12-26 DIAGNOSIS — I10 Essential (primary) hypertension: Secondary | ICD-10-CM

## 2017-12-26 DIAGNOSIS — H35723 Serous detachment of retinal pigment epithelium, bilateral: Secondary | ICD-10-CM | POA: Diagnosis not present

## 2017-12-26 DIAGNOSIS — H18519 Endothelial corneal dystrophy, unspecified eye: Secondary | ICD-10-CM

## 2017-12-26 DIAGNOSIS — H35033 Hypertensive retinopathy, bilateral: Secondary | ICD-10-CM

## 2017-12-26 DIAGNOSIS — H1851 Endothelial corneal dystrophy: Secondary | ICD-10-CM

## 2017-12-26 NOTE — Telephone Encounter (Signed)
-----   Message from Garvin Fila, MD sent at 12/23/2017 10:52 AM EST ----- Kindly inform patient that CT scan of head shows only age related shrinkage of brain and no worrisome findings

## 2017-12-26 NOTE — Telephone Encounter (Signed)
Rn call patient about the CT scan head. IT shows age related shrinkage of the brain no worrisome findings. PT saw it on my chart. She verbalized understanding. ------

## 2017-12-27 ENCOUNTER — Encounter (INDEPENDENT_AMBULATORY_CARE_PROVIDER_SITE_OTHER): Payer: Self-pay | Admitting: Ophthalmology

## 2018-01-02 NOTE — Progress Notes (Signed)
Recent Labs: 12/12/2017 scanned and reviewed.  (From PCP) Na+ 140, K+ 4.4, Cl- 102, HCO3- 27 , BUN 18, Cr 2.1*, Glu 233, Ca2+ 9.2; AST 23, ALT 13, AlkP 72 CBC: W 7.44, H/H 11.1/35.7, Plt 226  Kidney function is a little bit down compared to last check.  For now will defer to Dr. Virgina Jock.  Glenetta Hew, MD

## 2018-01-09 ENCOUNTER — Ambulatory Visit: Payer: Medicare HMO | Admitting: Neurology

## 2018-01-09 DIAGNOSIS — R410 Disorientation, unspecified: Secondary | ICD-10-CM | POA: Diagnosis not present

## 2018-01-09 DIAGNOSIS — R413 Other amnesia: Secondary | ICD-10-CM

## 2018-01-21 DIAGNOSIS — I10 Essential (primary) hypertension: Secondary | ICD-10-CM | POA: Diagnosis not present

## 2018-01-21 DIAGNOSIS — I509 Heart failure, unspecified: Secondary | ICD-10-CM | POA: Diagnosis not present

## 2018-01-22 ENCOUNTER — Ambulatory Visit: Payer: Medicare HMO | Admitting: Cardiology

## 2018-01-22 NOTE — Progress Notes (Signed)
[facility] Note  01/23/2018     CHIEF COMPLAINT Patient presents for Retina Follow Up   HISTORY OF PRESENT ILLNESS: Tara Jackson is a 77 y.o. female who presents to the clinic today for:   HPI    Retina Follow Up    Patient presents with  Wet AMD.  In both eyes.  Severity is moderate.  Duration of 4 weeks.  Since onset it is stable.  I, the attending physician,  performed the HPI with the patient and updated documentation appropriately.          Comments    4 Week follow up for Exu ARMD with PED OU, Patient here for ICG/OCT. Patient states looking at tv and laying in bed she see's double in the right eye. Everything else seems the same.       Last edited by Bernarda Caffey, MD on 01/24/2018  8:16 AM. (History)     Vision stable OU since last visit.  Noticed intermittent vertical diplopia OS when laying on right side.  Noticed more when watching tv.  Referring physician: Monna Fam, MD [place], [place] [id number]  HISTORICAL INFORMATION:   Selected notes from the MEDICAL RECORD NUMBER Referred by Dr. Monna Fam for concern of unusual large drusenoid, epi detachement LEE: 11.11.19 (K. Hecker) [BCVA: OD: 20/25++ OS: 20/20--] Ocular Hx-cataracts OU, glaucoma OU PMH-anemia, anxiety, arthritis, asthma, CKD, CAD, depression, DM(takes Novolin and glipizide), HLD, HTN,stroke    CURRENT MEDICATIONS: No current outpatient medications on file. (Ophthalmic Drugs)   No current facility-administered medications for this visit.  (Ophthalmic Drugs)   Current Outpatient Medications (Other)  Medication Sig  . acetaminophen (TYLENOL) 500 MG tablet Take 2 tablets (1,000 mg total) by mouth every 6 (six) hours as needed.  Marland Kitchen albuterol (PROVENTIL HFA;VENTOLIN HFA) 108 (90 BASE) MCG/ACT inhaler Inhale 2 puffs into the lungs every 6 (six) hours as needed for wheezing.  . bisacodyl (FLEET) 10 MG/30ML ENEM Place 10 mg rectally once.  .  busPIRone (BUSPAR) 10 MG tablet Take 1 tablet (10 mg total) by mouth every morning.  . Cholecalciferol (VITAMIN D-3 PO) Take 2 tablets by mouth every morning.   . clonazePAM (KLONOPIN) 0.5 MG tablet Take 0.5-1 mg by mouth See admin instructions. Takes 2 tabs in am and 1 tab in pm.  . clopidogrel (PLAVIX) 75 MG tablet Take 75 mg by mouth every morning.  Marland Kitchen DM-APAP-CPM (VICKS FORMULA 44 COUGH/COLD PM PO) Take by mouth. Used 1/2 cup  . glipiZIDE (GLUCOTROL) 10 MG tablet Take 20 mg by mouth 2 (two) times daily before a meal.   . guaiFENesin (MUCINEX) 600 MG 12 hr tablet Take 600 mg by mouth 2 (two) times daily as needed for cough.  . insulin NPH (HUMULIN N,NOVOLIN N) 100 UNIT/ML injection Inject 0-20 Units into the skin See admin instructions. Novolin-N Give 20 unit in morning; give  5 units at 4pm; evening dose as needed.  If less than 150 blood sugar= 0units.  If greater than 200 blood sugar= give 10units to decrease by 100 blood sugar  . insulin regular (NOVOLIN R,HUMULIN R) 100 units/mL injection Inject 0-25 Units into the skin See admin instructions. Give 25 unit in morning; give 20units at 4pm; evening dose as needed.  If less than 150 blood sugar= 0units.  If greater than 200 blood sugar= give 10units to decrease by 100 blood sugar  . ipratropium (ATROVENT) 0.03 % nasal spray Place 2 sprays into  both nostrils every 12 (twelve) hours. (Patient taking differently: Place 2 sprays into both nostrils every 12 (twelve) hours as needed for rhinitis. )  . levothyroxine (SYNTHROID, LEVOTHROID) 100 MCG tablet Take 88 mcg by mouth every morning.   . lidocaine (LIDODERM) 5 % Place 3 patches onto the skin daily as needed (pain). Remove & Discard patch within 12 hours or as directed by MD  . linaclotide (LINZESS) 290 MCG CAPS capsule Take 290 mcg by mouth daily before breakfast.  . LORazepam (ATIVAN) 0.5 MG tablet Take 0.5 mg by mouth daily as needed for anxiety.  . magnesium 30 MG tablet Take 30 mg by mouth  daily.  . midodrine (PROAMATINE) 2.5 MG tablet Take 1 tablet (2.5 mg total) by mouth 3 (three) times daily with meals.  . nortriptyline (PAMELOR) 50 MG capsule TAKE 2 CAPSULES (100MG ) AT BEDTIME (Patient taking differently: TAKE 1 CAPSULES (100MG ) AT two times daily)  . ondansetron (ZOFRAN) 4 MG tablet Take 4 mg by mouth as needed.  . OXYGEN Inhale 2 L into the lungs as needed. Used at overnight with a concentrator   . potassium chloride SA (K-DUR,KLOR-CON) 20 MEQ tablet Take 1 tablet (20 mEq total) by mouth 2 (two) times daily.  . rosuvastatin (CRESTOR) 20 MG tablet Take 20 mg by mouth daily.   . solifenacin (VESICARE) 10 MG tablet Take 10 mg by mouth daily as needed (overactive bladder).   Marland Kitchen terconazole (TERAZOL 7) 0.4 % vaginal cream Place 1 applicator vaginally at bedtime as needed (for yeast infection).   . traMADol (ULTRAM) 50 MG tablet Take 1 tablet (50 mg total) by mouth every 12 (twelve) hours as needed for moderate pain. (Patient taking differently: Take 50 mg by mouth 2 (two) times daily as needed for moderate pain. )  . vitamin B-12 (CYANOCOBALAMIN) 500 MCG tablet Take 1 tablet (500 mcg total) by mouth daily.  Marland Kitchen dicyclomine (BENTYL) 10 MG capsule Take 1 capsule (10 mg total) by mouth 3 (three) times daily before meals.  . furosemide (LASIX) 20 MG tablet Take 1 tablet (20 mg total) by mouth daily as needed for fluid or edema.  . polyethylene glycol (MIRALAX / GLYCOLAX) packet Take 17 g by mouth daily as needed for mild constipation.    No current facility-administered medications for this visit.  (Other)      REVIEW OF SYSTEMS: ROS    Positive for: Gastrointestinal, Neurological, Genitourinary, Musculoskeletal, Endocrine, Cardiovascular, Eyes, Respiratory   Negative for: Constitutional, Skin, HENT, Psychiatric, Allergic/Imm, Heme/Lymph   Last edited by Elmore Guise on 01/23/2018  2:12 PM. (History)       ALLERGIES Allergies  Allergen Reactions  . Iodine Other (See  Comments)    ONLY IV--Makes unconscious  . Iohexol Other (See Comments)     Code: SOB, Desc: cardiac arrest w/ iv contrast, has never used 13 hr prep//alice calhoun, Onset Date: [id number]   . Metoclopramide Hcl Other (See Comments)    suicidal  . Sulfa Antibiotics Hives  . Lactose Intolerance (Gi) Diarrhea  . Lansoprazole Nausea And Vomiting    PAST MEDICAL HISTORY Past Medical History:  Diagnosis Date  . Anemia   . Anxiety   . Arthritis   . Asthma   . Brittle bone disease   . Cataract   . CKD (chronic kidney disease)   . Complication of anesthesia    hard to wake up after anesthesia, trouble turning head  . Coronary artery disease, non-occlusive 2014   a. minimal CAD by  cath in 2014 with 40% RI stenosis. b. low-risk NST in 11/2014.  Marland Kitchen Depression   . Diabetes mellitus type 2, insulin dependent ([place])   . Diabetic neuropathy ([place])   . Diastolic dysfunction, left ventricle 11/30/14   EF 55%, grade 1 DD  . DVT (deep venous thrombosis) ([place])    "18 in RLE; 7 LLE prior to PE" (03/24/2015)  . Family history of adverse reaction to anesthesia    " the whole family is hard to wake up"  . Glaucoma   . H/O hiatal hernia   . Hyperlipidemia   . Hypertension associated with diabetes ([place])   . Hypothyroidism   . Kidney stones    "passed them"  . Memory loss 06/03/2014  . On home oxygen therapy    "2L oxygen concentrator @ night"  . Osteoporosis   . Oxygen desaturation during sleep    wears 2 liters of oxygen at night   . Palpitations 35years ago   Cardionet monitor - revealed mostly normal sinus rhythm, sinus bradycardia with first-degree A-V block heart rates mostly in the 50s and 60s with some 70s. No arrhythmias, PVCs or PACs noted.  . PE (pulmonary thromboembolism) ([place]) x 3, last one was ~ 2003   history of recurrent RLE DVT with PE - last PE ~>13 yrs; Maintained on Plavix  . Pneumonia   . Presence of permanent cardiac pacemaker   . Restless legs syndrome   . Sleep apnea     cpap disontinued; test done 07/14/2008 ordered per  [place]rum  . Spinal headache   . Spinal stenosis    L 3, L 4 and L 5, and C 1, C 2 and C3  . Stroke [facility]) 04-19-2013   tia and stroke. Weakness Rt hand  . 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 s/p [employer] PPM in 03/2015 by Dr. Lovena Le secondary to sinus node dysfunction.   Marland Kitchen TIA (transient ischemic attack) March 2014   Past Surgical History:  Procedure Laterality Date  . ABDOMINAL HYSTERECTOMY    . APPENDECTOMY    . BACK SURGERY     steroid inj  . CATARACT EXTRACTION Bilateral   . CATARACT EXTRACTION W/ INTRAOCULAR LENS  IMPLANT, BILATERAL Bilateral 2000s  . CHOLECYSTECTOMY    . EP IMPLANTABLE DEVICE N/A 03/24/2015   [facility] Scientific PPM, Dr. Lovena Le  . ESOPHAGOGASTRODUODENOSCOPY  08/09/2011   Procedure: ESOPHAGOGASTRODUODENOSCOPY (EGD);  Surgeon: Jeryl Columbia, MD;  Location: [facility];  Service: Endoscopy;  Laterality: N/A;  . ESOPHAGOGASTRODUODENOSCOPY (EGD) WITH PROPOFOL N/A 11/12/2013   Procedure: ESOPHAGOGASTRODUODENOSCOPY (EGD) WITH PROPOFOL;  Surgeon: Jeryl Columbia, MD;  Location: [facility] ENDOSCOPY;  Service: Endoscopy;  Laterality: N/A;  . ESOPHAGOGASTRODUODENOSCOPY (EGD) WITH PROPOFOL N/A 05/05/2016   Procedure: ESOPHAGOGASTRODUODENOSCOPY (EGD) WITH PROPOFOL;  Surgeon: Clarene Essex, MD;  Location: [facility] ENDOSCOPY;  Service: Endoscopy;  Laterality: N/A;  . GLAUCOMA SURGERY Bilateral 2000s   "laser"  . HOT HEMOSTASIS  08/09/2011   Procedure: HOT HEMOSTASIS (ARGON PLASMA COAGULATION/BICAP);  Surgeon: Jeryl Columbia, MD;  Location: [facility];  Service: Endoscopy;  Laterality: N/A;  . HOT HEMOSTASIS N/A 11/12/2013   Procedure: HOT HEMOSTASIS (ARGON PLASMA COAGULATION/BICAP);  Surgeon: Jeryl Columbia, MD;  Location: [facility];  Service: Endoscopy;  Laterality: N/A;  . INSERT / REPLACE / REMOVE PACEMAKER  03/24/2015  . IRIDOTOMY / IRIDECTOMY Bilateral   . JOINT REPLACEMENT    . KNEE SURGERY Left done with 2nd left knee replacement    spacer bar placement  . LEFT  HEART CATHETERIZATION WITH CORONARY ANGIOGRAM N/A 02/14/2012   Procedure: LEFT HEART CATHETERIZATION WITH CORONARY ANGIOGRAM;  Surgeon: Leonie Man, MD;  Location: [facility] CATH LAB;  Service: Cardiovascular:: no evidence of obstructive coronary disease ,low EDP with normal EF  . LEV DOPPLER  05/29/2012   RIGHT EXTREM. NORMAL VENOUS DUPLEX  . NM MYOCAR PERF WALL MOTION  01/26/2012; 11/2014   a. EF 79%,LV normal,ST SEGMENT CHANGE SUGGESTIVE OF ISCHEMIA.; LOW RISK, EF 60%. No ischemia or infarction. No wall motion abnormality   . PACEMAKER PLACEMENT    . REVISION TOTAL KNEE ARTHROPLASTY Left   . TONSILLECTOMY    . TOTAL KNEE ARTHROPLASTY Bilateral   . TRANSTHORACIC ECHOCARDIOGRAM  10/2016   EF 65-70%.  Vigorous LV function.  Mild LVH.  Poor acoustic windows.  Cannot assess diastolic function or significant valvular dysfunction.  Domingo Dimes ECHOCARDIOGRAM  2014; October 2016   a. EF 33-54%; normal diastolic pressures, no regional wall motion motion abnormalities;; b/ F 55-60%. Normal wall motion. GR 1 DD. No valve lesions  . TRIGGER FINGER RELEASE  11/22/2011   Procedure: RELEASE TRIGGER FINGER/A-1 PULLEY;  Surgeon: Meredith Pel, MD;  Location: [facility];  Service: Orthopedics;  Laterality: Left;  Left trigger thumb release  . TRIGGER FINGER RELEASE Right yrs ago    FAMILY HISTORY Family History  Problem Relation Age of Onset  . Cancer Mother   . Diabetes Mother   . Cancer - Prostate Father   . Diabetes Father   . Retinal detachment Son   . Diabetes Sister   . Diabetes Brother   . Diabetes Paternal Grandmother     SOCIAL HISTORY Social History   Tobacco Use  . Smoking status: Never Smoker  . Smokeless tobacco: Never Used  Substance Use Topics  . Alcohol use: No    Alcohol/week: 0.0 standard drinks  . Drug use: No         OPHTHALMIC EXAM:  Base Eye Exam    Visual Acuity (Snellen - Linear)      Right Left   Dist [place] 20/20-2 20/70-1    Dist ph [place]  20/30       Tonometry (Tonopen, 2:19 PM)      Right Left   Pressure 11 12       Pupils      Dark Light Shape React APD   Right 4 3 Round Brisk None   Left 4 3 Round Brisk None       Visual Fields (Counting fingers)      Left Right    Full Full       Extraocular Movement      Right Left    Full, Ortho Full, Ortho       Neuro/Psych    Oriented x3:  Yes   Mood/Affect:  Normal       Dilation    Both eyes:  2.5% Phenylephrine, 1.0% Mydriacyl @ 2:20 PM       Dilation #2    Both eyes:  10%phenylepherine, 1% atropine @ 3:05 PM        Slit Lamp and Fundus Exam    Slit Lamp Exam      Right Left   Lids/Lashes  Dermatochalasis - lower lid, Dermatochalasis - upper lid, Meibomian gland dysfunction Dermatochalasis - lower lid, Dermatochalasis - upper lid, Meibomian gland dysfunction   Conjunctiva/Sclera mild melanosis White and quiet   Cornea arcus, 1+ PEE, 2-3+ guttata, no edema arcus, well healed temporal cataract wound, 1+PEE, 2+  guttata   Anterior Chamber Deep and quiet Deep and quiet   Iris round, no NVI Round, mild atrophy inferotemporal   Lens PCIOL PC IOL   Vitreous Vitreous syneresis Vitreous syneresis       Fundus Exam      Right Left   Disc pink and sharp, central cupping pink and sharp   C/D Ratio 0.7 0.6   Macula blunted foveal reflex, RPE motltling and clumping, few MA, PED large PED nasal macula, blunted foveal reflex, scattered MA   Vessels  Vascular attenuation Vascular attenuation, mildly tortuous.   Periphery attached attached          IMAGING AND PROCEDURES  Imaging and Procedures for @TODAY @  OCT, Retina - OU - Both Eyes       Right Eye Quality was good. Central Foveal Thickness: 211. Progression has been stable. Findings include normal foveal contour, no IRF, no SRF, pigment epithelial detachment, epiretinal membrane, macular pucker, retinal drusen , outer retinal atrophy.   Left Eye Quality was good. Central Foveal  Thickness: 206. Progression has been stable. Findings include pigment epithelial detachment, normal foveal contour, no IRF, no SRF, retinal drusen  (Trace cystic SRF overlying large nasal PED).   Notes Diagnosis / Impression:  OD: NFP, no IRF/SRF, +focal ORA and PED, +ERM with mild pucker OS: large nasal PED with trace cystic SRF overlying PED  Clinical management:  See below  Abbreviations: NFP - Normal foveal profile. CME - cystoid macular edema. PED - pigment epithelial detachment. IRF - intraretinal fluid. SRF - subretinal fluid. EZ - ellipsoid zone. ERM - epiretinal membrane. ORA - outer retinal atrophy. ORT - outer retinal tubulation. SRHM - subretinal hyper-reflective material                 ASSESSMENT/PLAN:    ICD-10-CM   1. 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 Retinal edema H35.81 OCT, Retina - OU - Both Eyes  4. Moderate nonproliferative diabetic retinopathy of both eyes without macular edema associated with type 2 diabetes mellitus ([place]) O75.6433   5. Essential hypertension I10   6. Hypertensive retinopathy of both eyes H35.033   7. Pseudophakia of both eyes Z96.1   8. 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 Exudative ARMD w/ PED OU (OS > OD)  - The incidence pathology and anatomy of wet AMD discussed   - The ANCHOR, MARINA, CATT and VIEW trials discussed with patient.    - discussed treatment options including observation vs intravitreal anti-VEGF agents such as Avastin, Lucentis, Eylea.    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 Mild nonproliferative diabetic retinopathy w/o DME, both eyes - The incidence, risk factors for progression, natural history and treatment options for diabetic retinopathy were discussed  with patient.   - The need for close monitoring of blood glucose, blood pressure, and serum lipids, avoiding cigarette or any type of tobacco, and the need for long term follow up was also discussed with patient. - exam with scattered MA, IRH OU - FA shows mild MA; no NV - OCT without diabetic macular edema, both eyes   5,6. Hypertensive retinopathy OU - discussed importance of tight BP control - monitor  7. Pseudophakia OU  - s/p CE/IOL  - beautiful surgeries, doing well  - monitor  8. Fuch's endothelial dystrophy - mild guttata OU, no edema   Ophthalmic Meds Ordered this visit:  No orders of the defined types were placed in this encounter.      Return in about 6 weeks (around 03/06/2018) for F/u for wet ARMD OU w/DFE OU, OCT OU, FA OU.  There are no Patient Instructions on file for this visit.   Explained the diagnoses, plan, and follow up with the patient and they expressed understanding.  Patient expressed understanding of the importance of proper follow up care.   This document serves as a record of services personally performed by Gardiner Sleeper, MD, PhD. It was created on their behalf by Ernest Mallick, OA, an ophthalmic assistant. The creation of this record is the provider's dictation and/or activities during the visit.    Electronically signed by: Ernest Mallick, OA  12.16.19 8:16 AM     Gardiner Sleeper, M.D., Ph.D. Diseases & Surgery of the Retina and Vitreous [facility]  I have reviewed the above documentation for accuracy and completeness, and I agree with the above. Gardiner Sleeper, M.D., Ph.D. 01/24/18 8:20 AM   Abbreviations: M myopia (nearsighted); A astigmatism; H hyperopia (farsighted); P presbyopia; Mrx spectacle prescription;  CTL contact lenses; OD right eye; OS left eye; OU both eyes  XT exotropia; ET esotropia; PEK punctate epithelial keratitis; PEE punctate epithelial erosions; DES dry eye syndrome; MGD meibomian gland dysfunction; ATs artificial tears; PFAT's preservative free artificial tears; Miranda nuclear sclerotic cataract; PSC posterior subcapsular cataract; ERM epi-retinal membrane; PVD posterior vitreous detachment; RD retinal detachment; DM diabetes mellitus; DR diabetic retinopathy; NPDR non-proliferative diabetic retinopathy; PDR proliferative diabetic retinopathy; CSME clinically significant macular edema; DME diabetic macular edema; dbh dot blot hemorrhages; CWS cotton wool spot; POAG primary open angle glaucoma; C/D cup-to-disc ratio; HVF humphrey visual field; GVF goldmann visual field; OCT optical coherence tomography; IOP intraocular pressure; BRVO Branch retinal vein occlusion; CRVO central retinal vein occlusion; CRAO central retinal artery occlusion; BRAO branch retinal artery occlusion; RT retinal tear; SB scleral buckle; PPV pars plana vitrectomy; VH Vitreous hemorrhage; PRP panretinal laser photocoagulation; IVK intravitreal kenalog; VMT vitreomacular traction; MH Macular hole;  NVD neovascularization of the disc; NVE neovascularization elsewhere; AREDS age related eye disease study; ARMD age related macular degeneration; POAG primary open angle glaucoma; EBMD epithelial/anterior basement membrane dystrophy; ACIOL anterior chamber intraocular lens; IOL intraocular lens; PCIOL posterior chamber intraocular lens; Phaco/IOL phacoemulsification with intraocular lens placement; Pence photorefractive keratectomy; LASIK laser assisted in situ  keratomileusis; HTN hypertension; DM diabetes mellitus; COPD chronic obstructive pulmonary disease

## 2018-01-23 ENCOUNTER — Encounter (INDEPENDENT_AMBULATORY_CARE_PROVIDER_SITE_OTHER): Payer: Self-pay | Admitting: Ophthalmology

## 2018-01-23 ENCOUNTER — Ambulatory Visit: Payer: Medicare HMO | Admitting: Cardiology

## 2018-01-23 ENCOUNTER — Ambulatory Visit (INDEPENDENT_AMBULATORY_CARE_PROVIDER_SITE_OTHER): Payer: Medicare HMO | Admitting: Ophthalmology

## 2018-01-23 DIAGNOSIS — H35033 Hypertensive retinopathy, bilateral: Secondary | ICD-10-CM | POA: Diagnosis not present

## 2018-01-23 DIAGNOSIS — Z961 Presence of intraocular lens: Secondary | ICD-10-CM | POA: Diagnosis not present

## 2018-01-23 DIAGNOSIS — H1851 Endothelial corneal dystrophy: Secondary | ICD-10-CM

## 2018-01-23 DIAGNOSIS — H3581 Retinal edema: Secondary | ICD-10-CM | POA: Diagnosis not present

## 2018-01-23 DIAGNOSIS — E113393 Type 2 diabetes mellitus with moderate nonproliferative diabetic retinopathy without macular edema, bilateral: Secondary | ICD-10-CM

## 2018-01-23 DIAGNOSIS — H35723 Serous detachment of retinal pigment epithelium, bilateral: Secondary | ICD-10-CM | POA: Diagnosis not present

## 2018-01-23 DIAGNOSIS — I10 Essential (primary) hypertension: Secondary | ICD-10-CM

## 2018-01-23 DIAGNOSIS — H353232 Exudative age-related macular degeneration, bilateral, with inactive choroidal neovascularization: Secondary | ICD-10-CM

## 2018-01-23 DIAGNOSIS — H18519 Endothelial corneal dystrophy, unspecified eye: Secondary | ICD-10-CM

## 2018-01-24 ENCOUNTER — Encounter (INDEPENDENT_AMBULATORY_CARE_PROVIDER_SITE_OTHER): Payer: Self-pay | Admitting: Ophthalmology

## 2018-01-26 ENCOUNTER — Ambulatory Visit: Payer: Medicare HMO | Admitting: Cardiology

## 2018-02-13 DIAGNOSIS — Z9981 Dependence on supplemental oxygen: Secondary | ICD-10-CM | POA: Diagnosis not present

## 2018-02-13 DIAGNOSIS — E039 Hypothyroidism, unspecified: Secondary | ICD-10-CM | POA: Diagnosis not present

## 2018-02-13 DIAGNOSIS — E038 Other specified hypothyroidism: Secondary | ICD-10-CM | POA: Diagnosis not present

## 2018-02-13 DIAGNOSIS — H532 Diplopia: Secondary | ICD-10-CM | POA: Diagnosis not present

## 2018-02-13 DIAGNOSIS — I951 Orthostatic hypotension: Secondary | ICD-10-CM | POA: Diagnosis not present

## 2018-02-13 DIAGNOSIS — I129 Hypertensive chronic kidney disease with stage 1 through stage 4 chronic kidney disease, or unspecified chronic kidney disease: Secondary | ICD-10-CM | POA: Diagnosis not present

## 2018-02-13 DIAGNOSIS — N183 Chronic kidney disease, stage 3 (moderate): Secondary | ICD-10-CM | POA: Diagnosis not present

## 2018-02-13 DIAGNOSIS — H3530 Unspecified macular degeneration: Secondary | ICD-10-CM | POA: Diagnosis not present

## 2018-02-13 DIAGNOSIS — R413 Other amnesia: Secondary | ICD-10-CM | POA: Diagnosis not present

## 2018-02-13 DIAGNOSIS — I519 Heart disease, unspecified: Secondary | ICD-10-CM | POA: Diagnosis not present

## 2018-02-17 LAB — CUP PACEART REMOTE DEVICE CHECK
Battery Remaining Longevity: 138 mo
Battery Remaining Percentage: 100 %
Brady Statistic RA Percent Paced: 77 %
Brady Statistic RV Percent Paced: 38 %
Date Time Interrogation Session: 20191107084000
Implantable Lead Implant Date: 20170214
Implantable Lead Implant Date: 20170214
Implantable Lead Location: 753859
Implantable Lead Location: 753860
Implantable Lead Model: 7740
Implantable Lead Model: 7741
Implantable Lead Serial Number: 649859
Implantable Lead Serial Number: 728741
Implantable Pulse Generator Implant Date: 20170214
Lead Channel Impedance Value: 702 Ohm
Lead Channel Impedance Value: 766 Ohm
Lead Channel Pacing Threshold Amplitude: 0.6 V
Lead Channel Pacing Threshold Amplitude: 1.2 V
Lead Channel Pacing Threshold Pulse Width: 0.4 ms
Lead Channel Pacing Threshold Pulse Width: 0.4 ms
Lead Channel Setting Pacing Amplitude: 2 V
Lead Channel Setting Pacing Amplitude: 2 V
Lead Channel Setting Pacing Pulse Width: 0.4 ms
Lead Channel Setting Sensing Sensitivity: 2.5 mV
Pulse Gen Serial Number: 718098

## 2018-02-20 ENCOUNTER — Encounter: Payer: Self-pay | Admitting: Neurology

## 2018-02-20 ENCOUNTER — Ambulatory Visit: Payer: Medicare HMO | Admitting: Cardiology

## 2018-02-20 ENCOUNTER — Encounter: Payer: Self-pay | Admitting: Cardiology

## 2018-02-20 ENCOUNTER — Ambulatory Visit: Payer: Medicare HMO | Admitting: Neurology

## 2018-02-20 VITALS — BP 124/70 | HR 59 | Ht 67.5 in | Wt 252.2 lb

## 2018-02-20 VITALS — BP 132/61 | HR 60 | Ht 67.5 in | Wt 251.2 lb

## 2018-02-20 DIAGNOSIS — I951 Orthostatic hypotension: Secondary | ICD-10-CM

## 2018-02-20 DIAGNOSIS — I5032 Chronic diastolic (congestive) heart failure: Secondary | ICD-10-CM | POA: Diagnosis not present

## 2018-02-20 DIAGNOSIS — R0609 Other forms of dyspnea: Secondary | ICD-10-CM

## 2018-02-20 DIAGNOSIS — R0600 Dyspnea, unspecified: Secondary | ICD-10-CM

## 2018-02-20 DIAGNOSIS — G3184 Mild cognitive impairment, so stated: Secondary | ICD-10-CM

## 2018-02-20 DIAGNOSIS — I495 Sick sinus syndrome: Secondary | ICD-10-CM | POA: Diagnosis not present

## 2018-02-20 DIAGNOSIS — E785 Hyperlipidemia, unspecified: Secondary | ICD-10-CM | POA: Diagnosis not present

## 2018-02-20 MED ORDER — DONEPEZIL HCL 10 MG PO TABS: 10.0000 mg | ORAL_TABLET | Freq: Every day | ORAL | 3 refills | Status: DC

## 2018-02-20 NOTE — Progress Notes (Signed)
PCP: Shon Baton, MD  Clinic Note: Chief Complaint  Patient presents with  . Follow-up  . Dizziness    over exertion  . Shortness of Breath    over exertion  . Edema    in feet     HPI: Tara Jackson is a 78 y.o. female with a PMH notable for chronic PEs (on long-standing Plavix after prolonged warfarin), symptomatic bradycardia status- post pacemaker as well as significant with static hypotension who presents for today for 10-month/hospital follow-up.     Other than for bradycardia, she has been seen for various complaints of shortness of breath and intermittent chest pain with relatively normal cardiac evaluations.   She has had a history of abnormal nuclear stress test with normal coronaries on cath.  Tara Jackson was last seen on Sept 2019 following several hospitalizations& ER visits  Recent Hospitalizations:   ER visit Jun 12, 2017 for right leg pain --> no evidence of DVT.  ER visit August 04, 2016: Near syncope -> with sudden fall.  No precipitating palpitations or other symptoms.  PPM interrogation revealed no arrhythmia.  Negative head CT.  ER visit August 23, 2017: Fall x 2.  Unable to get up off the floor after a second fall.  Again CT head without acute findings.  No fractures.  ER visit-hospitalization September 12, 2017 -> finally last antihypertensive agent was held and Lasix was made PRN.  Was told to wear TED hose.  Was found to have some renal insufficiency has not been followed up at [facility]  (peak creatinine was roughly 3.5) -->  Lab Results  Component Value Date   CREATININE 1.66 (H) 09/14/2017   CREATININE 1.66 (H) 09/13/2017   CREATININE 1.88 (H) 09/12/2017    Studies Personally Reviewed - (if available, images/films reviewed: From Epic Chart or Care Everywhere)  Echo 11/2017: EF 60-65%. Gr1 DD. PAP ~33 mmHg.  Mild TR. PPM leads in RA & RV.   Interval History: Tara Jackson may presents today overall pretty much stable.  She occasionally  notes her heart rate going up and down but usually this is a occurs with stress or activity.  She did have one fall last week.  She notes that the dizziness can occur with either walking or with standing up necessarily.  She rarely uses midodrine.  She has had a couple falls from poor balance, but no syncope or near syncope.  No TIA or amaurosis fugax.  She has a chronic mild lower extremity edema but that she always feels dry mouth.  She always feels as though she needs to have something to drink.  Last time she has episodes of chest discomfort and palpitations that are usually relieved with drinking cold water.  She is down to taking Lasix maybe once every couple weeks.  She no longer is on the ARB that was stopped back in August after her hospitalization for hypotension.  She off-and-on has episodes of dyspnea at night.  Uses nighttime oxygen CPAP.  No real PND or orthopnea.  Although she does note some occasional fast heart spells, she denies any prolonged episodes of rapid irregular heartbeats. She does have episodes which she feels are related to anxiety and asked about Ativan which I did suggest that she discuss with her PCP.  She is somewhat debilitated from arthritis pains and obesity, therefore does have existing exertional dyspnea which is pretty stable.  Chest discomfort she is describing this does not necessarily seem to be exertional.  No claudication  ROS: A comprehensive was performed. Review of Systems  Constitutional: Positive for malaise/fatigue (Chronically low energy) and weight loss (Roughly 10 pounds). Negative for chills and fever.  HENT: Negative for congestion.   Respiratory: Positive for cough and shortness of breath (Baseline). Negative for sputum production and wheezing.   Gastrointestinal: Positive for abdominal pain. Negative for blood in stool, constipation, heartburn, melena and nausea.       Poor appetite, but drinks plenty  Genitourinary: Positive for urgency.  Negative for dysuria and hematuria.  Musculoskeletal: Positive for back pain (This is really the most limiting feature.), falls and joint pain.  Neurological: Positive for dizziness and headaches. Negative for focal weakness and loss of consciousness (Per HPI).  Psychiatric/Behavioral: Positive for depression. Negative for memory loss (Does not stay asleep well.). The patient is nervous/anxious (Asked questions about medicines.). The patient does not have insomnia.   All other systems reviewed and are negative.  I have reviewed and (if needed) personally updated the patient's problem list, medications, allergies, past medical and surgical history, social and family history.   Past Medical History:  Diagnosis Date  . Anemia   . Anxiety   . Arthritis   . Asthma   . Brittle bone disease   . Cataract   . CKD (chronic kidney disease)   . Complication of anesthesia    hard to wake up after anesthesia, trouble turning head  . Coronary artery disease, non-occlusive 2014   a. minimal CAD by cath in 2014 with 40% RI stenosis. b. low-risk NST in 11/2014.  Marland Kitchen Depression   . Diabetes mellitus type 2, insulin dependent ([place])   . Diabetic neuropathy ([place])   . Diastolic dysfunction, left ventricle 11/30/14   EF 55%, grade 1 DD  . DVT (deep venous thrombosis) ([place])    "18 in RLE; 7 LLE prior to PE" (03/24/2015)  . Family history of adverse reaction to anesthesia    " the whole family is hard to wake up"  . Glaucoma   . H/O hiatal hernia   . Hyperlipidemia   . Hypertension associated with diabetes ([place])   . Hypothyroidism   . Kidney stones    "passed them"  . Memory loss 06/03/2014  . On home oxygen therapy    "2L oxygen concentrator @ night"  . Osteoporosis   . Oxygen desaturation during sleep    wears 2 liters of oxygen at night   . Palpitations 35years ago   Cardionet monitor - revealed mostly normal sinus rhythm, sinus bradycardia with first-degree A-V block heart rates mostly in the 50s  and 60s with some 70s. No arrhythmias, PVCs or PACs noted.  . PE (pulmonary thromboembolism) ([place]) x 3, last one was ~ 2003   history of recurrent RLE DVT with PE - last PE ~>13 yrs; Maintained on Plavix  . Pneumonia   . Presence of permanent cardiac pacemaker   . Restless legs syndrome   . Sleep apnea    cpap disontinued; test done 07/14/2008 ordered per  Byrum  . Spinal headache   . Spinal stenosis    L 3, L 4 and L 5, and C 1, C 2 and C3  . Stroke [facility]) 04-19-2013   tia and stroke. Weakness Rt hand  . 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 s/p [employer] PPM in 03/2015 by Dr. Lovena Le secondary to sinus node dysfunction.   Marland Kitchen TIA (transient ischemic attack) March 2014    Past Surgical History:  Procedure Laterality  Date  . ABDOMINAL HYSTERECTOMY    . APPENDECTOMY    . BACK SURGERY     steroid inj  . CATARACT EXTRACTION Bilateral   . CATARACT EXTRACTION W/ INTRAOCULAR LENS  IMPLANT, BILATERAL Bilateral 2000s  . CHOLECYSTECTOMY    . EP IMPLANTABLE DEVICE N/A 03/24/2015   [facility] [employer] PPM, Dr. Lovena Le  . ESOPHAGOGASTRODUODENOSCOPY  08/09/2011   Procedure: ESOPHAGOGASTRODUODENOSCOPY (EGD);  Surgeon: Jeryl Columbia, MD;  Location: [facility];  Service: Endoscopy;  Laterality: N/A;  . ESOPHAGOGASTRODUODENOSCOPY (EGD) WITH PROPOFOL N/A 11/12/2013   Procedure: ESOPHAGOGASTRODUODENOSCOPY (EGD) WITH PROPOFOL;  Surgeon: Jeryl Columbia, MD;  Location: [facility];  Service: Endoscopy;  Laterality: N/A;  . ESOPHAGOGASTRODUODENOSCOPY (EGD) WITH PROPOFOL N/A 05/05/2016   Procedure: ESOPHAGOGASTRODUODENOSCOPY (EGD) WITH PROPOFOL;  Surgeon: Clarene Essex, MD;  Location: [facility] ENDOSCOPY;  Service: Endoscopy;  Laterality: N/A;  . GLAUCOMA SURGERY Bilateral 2000s   "laser"  . HOT HEMOSTASIS  08/09/2011   Procedure: HOT HEMOSTASIS (ARGON PLASMA COAGULATION/BICAP);  Surgeon: Jeryl Columbia, MD;  Location: [facility];  Service: Endoscopy;  Laterality: N/A;  . HOT HEMOSTASIS N/A 11/12/2013   Procedure:  HOT HEMOSTASIS (ARGON PLASMA COAGULATION/BICAP);  Surgeon: Jeryl Columbia, MD;  Location: [facility];  Service: Endoscopy;  Laterality: N/A;  . INSERT / REPLACE / REMOVE PACEMAKER  03/24/2015  . IRIDOTOMY / IRIDECTOMY Bilateral   . JOINT REPLACEMENT    . KNEE SURGERY Left done with 2nd left knee replacement   spacer bar placement  . LEFT HEART CATHETERIZATION WITH CORONARY ANGIOGRAM N/A 02/14/2012   Procedure: LEFT HEART CATHETERIZATION WITH CORONARY ANGIOGRAM;  Surgeon: Leonie Man, MD;  Location: [facility] CATH LAB;  Service: Cardiovascular:: no evidence of obstructive coronary disease ,low EDP with normal EF  . LEV DOPPLER  05/29/2012   RIGHT EXTREM. NORMAL VENOUS DUPLEX  . NM MYOCAR PERF WALL MOTION  01/26/2012; 11/2014   a. EF 79%,LV normal,ST SEGMENT CHANGE SUGGESTIVE OF ISCHEMIA.; LOW RISK, EF 60%. No ischemia or infarction. No wall motion abnormality   . PACEMAKER PLACEMENT    . REVISION TOTAL KNEE ARTHROPLASTY Left   . TONSILLECTOMY    . TOTAL KNEE ARTHROPLASTY Bilateral   . TRANSTHORACIC ECHOCARDIOGRAM  11/2017   11/2017: EF 60-65%. Gr1 DD. PAP ~33 mmHg.  Mild TR. PPM leads in RA & RV.   Marland Kitchen TRANSTHORACIC ECHOCARDIOGRAM  2014; October 2016   a. EF 02-54%; normal diastolic pressures, no regional wall motion motion abnormalities;; b/ F 55-60%. Normal wall motion. GR 1 DD. No valve lesions  . TRIGGER FINGER RELEASE  11/22/2011   Procedure: RELEASE TRIGGER FINGER/A-1 PULLEY;  Surgeon: Meredith Pel, MD;  Location: [place];  Service: Orthopedics;  Laterality: Left;  Left trigger thumb release  . TRIGGER FINGER RELEASE Right yrs ago    Current Meds  Medication Sig  . acetaminophen (TYLENOL) 500 MG tablet Take 2 tablets (1,000 mg total) by mouth every 6 (six) hours as needed.  Marland Kitchen albuterol (PROVENTIL HFA;VENTOLIN HFA) 108 (90 BASE) MCG/ACT inhaler Inhale 2 puffs into the lungs every 6 (six) hours as needed for wheezing.  . bisacodyl (FLEET) 10 MG/30ML ENEM Place 10 mg rectally once.  .  busPIRone (BUSPAR) 10 MG tablet Take 1 tablet (10 mg total) by mouth every morning.  . Cholecalciferol (VITAMIN D-3 PO) Take 2 tablets by mouth every morning.   . clonazePAM (KLONOPIN) 0.5 MG tablet Take 0.5-1 mg by mouth See admin instructions. Takes 2 tabs in am and 1 tab in pm.  .  clopidogrel (PLAVIX) 75 MG tablet Take 75 mg by mouth every morning.  . dicyclomine (BENTYL) 10 MG capsule Take 1 capsule (10 mg total) by mouth 3 (three) times daily before meals.  Marland Kitchen DM-APAP-CPM (VICKS FORMULA 44 COUGH/COLD PM PO) Take by mouth. Used 1/2 cup  . furosemide (LASIX) 20 MG tablet Take 1 tablet (20 mg total) by mouth daily as needed for fluid or edema.  Marland Kitchen glipiZIDE (GLUCOTROL) 10 MG tablet Take 20 mg by mouth 2 (two) times daily before a meal.   . guaiFENesin (MUCINEX) 600 MG 12 hr tablet Take 600 mg by mouth 2 (two) times daily as needed for cough.  . insulin NPH (HUMULIN N,NOVOLIN N) 100 UNIT/ML injection Inject 0-20 Units into the skin See admin instructions. Novolin-N Give 20 unit in morning; give  5 units at 4pm; evening dose as needed.  If less than 150 blood sugar= 0units.  If greater than 200 blood sugar= give 10units to decrease by 100 blood sugar  . insulin regular (NOVOLIN R,HUMULIN R) 100 units/mL injection Inject 0-25 Units into the skin See admin instructions. Give 25 unit in morning; give 20units at 4pm; evening dose as needed.  If less than 150 blood sugar= 0units.  If greater than 200 blood sugar= give 10units to decrease by 100 blood sugar  . ipratropium (ATROVENT) 0.03 % nasal spray Place 2 sprays into both nostrils every 12 (twelve) hours. (Patient taking differently: Place 2 sprays into both nostrils every 12 (twelve) hours as needed for rhinitis. )  . levothyroxine (SYNTHROID, LEVOTHROID) 100 MCG tablet Take 88 mcg by mouth every morning.   . lidocaine (LIDODERM) 5 % Place 3 patches onto the skin daily as needed (pain). Remove & Discard patch within 12 hours or as directed by MD  .  linaclotide (LINZESS) 290 MCG CAPS capsule Take 290 mcg by mouth daily before breakfast.  . LORazepam (ATIVAN) 0.5 MG tablet Take 0.5 mg by mouth daily as needed for anxiety.  . magnesium 30 MG tablet Take 30 mg by mouth daily.  . midodrine (PROAMATINE) 2.5 MG tablet Take 1 tablet (2.5 mg total) by mouth 3 (three) times daily with meals.  . nortriptyline (PAMELOR) 50 MG capsule TAKE 2 CAPSULES (100MG ) AT BEDTIME (Patient taking differently: TAKE 1 CAPSULES (100MG ) AT two times daily)  . ondansetron (ZOFRAN) 4 MG tablet Take 4 mg by mouth as needed.  . OXYGEN Inhale 2 L into the lungs as needed. Used at overnight with a concentrator   . polyethylene glycol (MIRALAX / GLYCOLAX) packet Take 17 g by mouth daily as needed for mild constipation.   . potassium chloride SA (K-DUR,KLOR-CON) 20 MEQ tablet Take 1 tablet (20 mEq total) by mouth 2 (two) times daily.  . rosuvastatin (CRESTOR) 20 MG tablet Take 20 mg by mouth daily.   . solifenacin (VESICARE) 10 MG tablet Take 10 mg by mouth daily as needed (overactive bladder).   Marland Kitchen terconazole (TERAZOL 7) 0.4 % vaginal cream Place 1 applicator vaginally at bedtime as needed (for yeast infection).   . traMADol (ULTRAM) 50 MG tablet Take 1 tablet (50 mg total) by mouth every 12 (twelve) hours as needed for moderate pain. (Patient taking differently: Take 50 mg by mouth 2 (two) times daily as needed for moderate pain. )  . vitamin B-12 (CYANOCOBALAMIN) 500 MCG tablet Take 1 tablet (500 mcg total) by mouth daily.    Allergies  Allergen Reactions  . Iodine Other (See Comments)    ONLY IV--Makes unconscious  .  Iohexol Other (See Comments)     Code: SOB, Desc: cardiac arrest w/ iv contrast, has never used 13 hr prep//alice calhoun, Onset Date: 32440102   . Metoclopramide Hcl Other (See Comments)    suicidal  . Sulfa Antibiotics Hives  . Lactose Intolerance (Gi) Diarrhea  . Lansoprazole Nausea And Vomiting    Social History   Tobacco Use  . Smoking  status: Never Smoker  . Smokeless tobacco: Never Used  Substance Use Topics  . Alcohol use: No    Alcohol/week: 0.0 standard drinks  . Drug use: No   Social History   Social History Narrative   Married Tara Jackson.) married 52 years.   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     family history includes Cancer in her mother; Cancer - Prostate in her father; Diabetes in her brother, father, mother, paternal grandmother, and sister; Retinal detachment in her son.  Wt Readings from Last 3 Encounters:  02/20/18 252 lb 3.2 oz (114.4 kg)  02/20/18 251 lb 3.2 oz (113.9 kg)  12/12/17 252 lb 9.6 oz (114.6 kg)    PHYSICAL EXAM BP 132/61   Pulse 60   Ht 5' 7.5" (1.715 m)   Wt 251 lb 3.2 oz (113.9 kg)   BMI 38.76 kg/m  Physical Exam  Constitutional: She is oriented to person, place, and time. She appears well-developed and well-nourished. No distress.  Morbidly obese.  Well-groomed.  HENT:  Head: Normocephalic and atraumatic.  Neck: Normal range of motion. Neck supple. No hepatojugular reflux and no JVD present. Carotid bruit is not present.  Cardiovascular: Normal rate, regular rhythm, S1 normal, S2 normal and intact distal pulses. PMI is not displaced (Unable to palpate). Exam reveals distant heart sounds. Exam reveals no gallop and no friction rub.  No murmur heard. Pulmonary/Chest: Effort normal and breath sounds normal. No respiratory distress. She has no wheezes. She has no rales.  Musculoskeletal: Normal range of motion.        General: Edema (Trivial) present.  Neurological: She is alert and oriented to person, place, and time. No cranial nerve deficit.  Psychiatric: Her behavior is normal. Judgment and thought content normal.  Her mood does seem to be a little bit depressed  Vitals reviewed.    Adult ECG Report Not checked  Other studies Reviewed:  Additional studies/ records that were reviewed today include:  Recent Labs:  Lab Results   Component Value Date   CREATININE 1.66 (H) 09/14/2017   BUN 19 09/14/2017   NA 140 09/14/2017   K 4.0 09/14/2017   CL 104 09/14/2017   CO2 24 09/14/2017   Lipid panel from [facility] March 2018: TC 131, TG 79, HDL 68 (LDL not recorded but from February 2018 was 104).  A1c 7.5  ASSESSMENT / PLAN: Problem List Items Addressed This Visit    Chronic diastolic heart failure ([place]) - Primary (Chronic)    Much better echo results from recent check.  Normal EF and only grade 1 diastolic dysfunction.  As long as her blood pressures are stable, she is doing well.  Remains euvolemic on exam rarely used as needed Lasix.  Allowing for mild permissive hypertension, but blood pressure looks pretty good.      DOE (dyspnea on exertion) (Chronic)    Multifactorial but mostly related to obesity and deconditioning.      Hyperlipidemia with target LDL less than 130 (Chronic)    Followed by PCP.  She asked  about possibly stopping statin.  I will defer to PCP, but based on her recent labs I think would not be unreasonable if it would help with some of her weakness and fatigue.      Morbid obesity ([place]) (Chronic)    Losing some weight, but needs to continue staying physically active with physical therapy.      Sinus node dysfunction ([facility]) (Chronic)    Doing well status post pacemaker.  Being followed by EP.      Syncope due to orthostatic hypotension (Chronic)    Allowing for permissive hypertension.  No longer on ARB.  I do think there is component of neurogenic factors from peripheral neuropathy. Most recent falls were not related to syncope or orthostasis.  More related to balance issues.  Continue to hold antihypertensive agents.  Lasix only use PRN.          I spent a total of 50minutes with the patient and chart review. >  50% of the time was spent in direct patient consultation.   Current medicines are reviewed at length with the patient today.  (+/- concerns) n/a The following changes have  been made:    None  Patient Instructions  Medication Instructions:  NOT NEEDED If you need a refill on your cardiac medications before your next appointment, please call your pharmacy.   Lab work: NOT NEEDED If you have labs (blood work) drawn today and your tests are completely normal, you will receive your results only by: Marland Kitchen MyChart Message (if you have MyChart) OR . A paper copy in the mail If you have any lab test that is abnormal or we need to change your treatment, we will call you to review the results.  Testing/Procedures: NOT NEEDED  Follow-Up: At [facility], you and your health needs are our priority.  As part of our continuing mission to provide you with exceptional heart care, we have created designated Provider Care Teams.  These Care Teams include your primary Cardiologist (physician) and Advanced Practice Providers (APPs -  Physician Assistants and Nurse Practitioners) who all work together to provide you with the care you need, when you need it. You will need a follow up appointment in 6 months July 2020.  Please call our office 2 months in advance to schedule this appointment.  You may see Glenetta Hew, MD or one of the following Advanced Practice Providers on your designated Care Team:   Rosaria Ferries, PA-C . Jory Sims, DNP, ANP  Any Other Special Instructions Will Be Listed Below (If Applicable). DR Jeris Roser RECOMMEND YOU PURCHASE "ZIPPERED" SUPPORT KNEE-HI SOCKS OR HOSE.  -- ELEVATED YOUR FEET WHILE SITTING.  -- DISCUSS WITH PRIMARY- DR Virgina Jock ABOUT YOUR MEDICATION DOSING OF Tara Jackson, M.D., M.S. Interventional Cardiologist   Pager # [phone] Phone # [phone] [address] [place], [place] [address]   Thank you for choosing Heartcare at [facility]!!

## 2018-02-20 NOTE — Patient Instructions (Signed)
I had a long discussion with the patient and her husband regarding her memory loss and mild cognitive impairment and recommend trial of Aricept for improvement as her deficit persists despite correction of thyroid status and recent work-up for reversible causes otherwise has been unyielding..  I have discussed possible side effects with the patient and the husband and asked him to call me if needed.  Start 5 mg daily with food for a month increase if tolerated to 10 mg daily.  She was also encouraged to increase participation in cognitively challenging activities like solving crossword puzzles, playing bridge and sodoku.  We also discussed memory compensation strategies.  She may also consider taking Prevagen daily if she wants.  She was advised to continue Midodrin for orthostatic hypotension and to follow-up with the primary physician for the same.  She will return for follow-up in 3 months or call earlier if necessary.

## 2018-02-20 NOTE — Patient Instructions (Signed)
Medication Instructions:  NOT NEEDED If you need a refill on your cardiac medications before your next appointment, please call your pharmacy.   Lab work: NOT NEEDED If you have labs (blood work) drawn today and your tests are completely normal, you will receive your results only by: Marland Kitchen MyChart Message (if you have MyChart) OR . A paper copy in the mail If you have any lab test that is abnormal or we need to change your treatment, we will call you to review the results.  Testing/Procedures: NOT NEEDED  Follow-Up: At [facility], you and your health needs are our priority.  As part of our continuing mission to provide you with exceptional heart care, we have created designated Provider Care Teams.  These Care Teams include your primary Cardiologist (physician) and Advanced Practice Providers (APPs -  Physician Assistants and Nurse Practitioners) who all work together to provide you with the care you need, when you need it. You will need a follow up appointment in 6 months July 2020.  Please call our office 2 months in advance to schedule this appointment.  You may see Glenetta Hew, MD or one of the following Advanced Practice Providers on your designated Care Team:   Rosaria Ferries, PA-C . Jory Sims, DNP, ANP  Any Other Special Instructions Will Be Listed Below (If Applicable). DR HARDING RECOMMEND YOU PURCHASE "ZIPPERED" SUPPORT KNEE-HI SOCKS OR HOSE.  -- ELEVATED YOUR FEET WHILE SITTING.  -- DISCUSS WITH PRIMARY- DR Virgina Jock ABOUT YOUR MEDICATION DOSING OF Darnelle Going

## 2018-02-20 NOTE — Progress Notes (Signed)
PATIENT: Seryna Marek DOB: 1940/06/05  REASON FOR VISIT: follow up- headache, memory loss HISTORY FROM: patient  HISTORY OF PRESENT ILLNESS: [name] is a 78 year old female with a history of headaches and memory loss. She returns today for follow-up. She reports that nortriptyline has been beneficial or her headaches. Although she still has a headache daily. She states that the headache is always on the right side of the head. She does have photophobia but denies phonophobia. Occasionally will have nausea. She states that her headache can get severe and rates her pain as a 10 out of 10 on the pain scale. She uses Excedrin for her headaches. In the past Tylenol No. 3 has been provided however the patient is not clear if she is taking this or not. The patient reports that she has been taking one tablet of nortriptyline however the prescription was written for 2 tablets at bedtime. Patient denies any new symptoms. She returns today for an evaluation.  HISTORY 04/07/15: [name] is a 78 year old female with a history of headaches, TIA and memory loss. She returns today for follow-up. She reports that today she has a headache. She states that it started approximately 1 month ago and has been daily in nature. She rates her pain a 10 out of 10 today. She describes her discomfort as pulsatile in the temporal region. She does describe a stiff neck. Denies nausea and vomiting. Does have some photophobia. Denies phonophobia. She has tried tramadol 100 mg with no relief. In the past the patient has been on tizanidine (2014) and according to the notes she received good benefit from this medication. The patient does not recall this medication and is unsure why she is no longer taking it-- other than her prescription may have ran out. The patient does feel that her memory is worse. She states that last night she did not remember eating supper. The patient is able to complete most ADLs independently. Although she  requires assistance with putting on her shoes and stockings. Her husband also picks out her clothes for her. She no longer does much cooking because she feels dizzy at times. She does not operate a motor vehicle. She continues to do the finances however last month she had some complications however she is going to try again this month. The patient is not currently on any medication for her memory. Her BuSpar was increased at the last visit however the patient is not aware of this. Her husband is with her today at the visit. 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  HISTORY: [name] is a 78 year old African lady who developed new-onset chronic daily headaches for the last 4 weeks. She states that she had some dental extractions done of her upper molar teeth about a week ago following which she noticed worsening of her pain which predominantly is in the temporal head regions but does spread all over. She states the pain is constant, severe. At times it is pulsatile. She was seen in the emergency room at [facility] on 07/23/12. She was treated with IV Dilaudid, Phenergan, Zofran and Benadryl in no hopping on the subtotal benefit. CT scan of the maxillary region showed no evidence of abscess associated with a total extraction but showed postoperative changes with communication between the oral cavity and nasal maxillary sinus. There was evidence of sinusitis seen. CT scan of the head showed no acute abnormality. The headache is accompanied by some  nausea and light sensitivity and occasional vomiting. The headache remains unchanged throughout the day. She is currently being treated with antibiotic course for a swollen gums. She has tried oxycodone, Ultram and Tylenol which have not helped. She does also have history of degenerative cervical spine disease she does complain of neck pain and muscle tightness and spasm as well. She has had 4 minor falls since her last visit 2  months ago. She even had a brief episode of blacking out yesterday but her husband caught her. She found that her blood pressure was low and her primary care physician has reduced her blood pressure medication since then. She denies any prior history of migraine headaches of similar headaches in the past. She denies any blurred vision or loss of vision accompanying her headaches. 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   UPDATE 11/15/12 (LL): Mrs. Dix comes to office for revisit for daily headache after wisdom tooth extraction. MRI cervical spine shows prominent spondylitic changes mostly at C5-6 and C4-5. with lateral disc osteophyte protrusions resulting in moderate foraminal narrowing on the right and mild canal stenosis without definite compression. MRI brain showed mild changes of microvascular ischemia. Advanced changes of chronic paranasal sinusitis. She states that her headaches are much better, about 84 % better than last visit. She does not have a headache today. She is using Tizanadine 4 mg for neck muscle tension. She had a recent sleep study which showed snoring but AHI within normal limits. Restless legs syndrome was suggested due to frequent leg movements. She states at night sometimes she feels like something is crawling on her lower legs and she feels like she must move them.   UPDATE 09/19/13 (LL): Since last visit Ms. Baack states that she has had some problems with abdominal pain and constipation. She recently went to the ER with abdominal pain and constipation times one week, despite the use of several enemas. Her headaches have not been bad in the last few months. She had a epidural pain injection in May at pain management. She denies any focal TIA or stroke symptoms, but has numerous symptom complaints on review of systems. She is concerned about leg swelling and abdominal swelling,  although her weight is up another 21 pounds since last visit. She states she has been having frequent falls without injury, but when she falls it's difficult for her to get up by herself and even difficult for her husband to get her up. She does not exercise and uses a motorized wheelchair his most of the time. Of particular concern today is an increase in hand tremor right greater than left, which is present at rest but more intense with action. It is causing her embarrassment. Update 06/03/2014 : She returns for follow-up after last visit 6 months ago. She is accompanied by husband. Patient reports new complaint of memory difficulties for the last 3 months. She states that this is mainly short-term memory. She cannot remember recent events and conversations and needs to be reminded multiple times. She often asked the same questions when she was told to answer a short while back. At times she feels that she has trouble completing sentences and can stop in mid sentence and search for what she was trying to say. She has had some episodes of hypoglycemia but feels her memory difficulties and are not related to that. She  was prescribed primidone at last visit for tremor but she did not fill the prescription but she remains on Inderal 40 mg which seems to help her tremor quite well. She was hospitalized about 4-6 weeks ago with chest pain and all workup was negative except heart rate was slow. She has long-standing history of depression and does take BuSpar 7.5 mg daily as well as Klonopin but feels her depression is not adequately treated. She has had no recurrent stroke or TIA symptoms. She remains on clopidogrel which is tolerating well without significant bleeding or bruising. She states her blood pressure and cholesterol control have been good Update 04/06/2015 : She returns for follow-up after last visit 6 months ago. She is accompanied by her husband. Patient states that in memory diminished slightly worse and  she has trouble remembering some recent information. However she can still manage to take care of her daily activities by herself. She was recently hospitalized 2 weeks ago and had a pacemaker implanted by Dr. Lovena Le. She continues to have daily headaches but off and on and not constant. The headaches vary in severity from 6/10-10/10. She takes Tylenol which seems to help. On average needs to 3 tablets a day at least. She needs to take tramadol occasionally which doesn't work as well. She has been taking Pamelor 50 mg capsule at night for her remote history of anxiety attacks. She states her blood pressure is well controlled and today it is low at 103/61. She is having trouble controlling her sugars which have fluctuated a bit she has not had any recurrent stroke or TIA symptoms. She remains on Plavix which is tolerating well without bleeding or bruising.  Update 01/12/2016 : She returns for follow-up after last visit 9 months ago. She is accompanied by husband. Patient states that she had a minor fall about 3 weeks ago and slipped and her head hit a car door. Since then she's been having daily headaches. She describes this as mainly posterior headaches which are mild to moderate in nature. She had been asked to increase her dose of Pamelor to 100 mg at night and last visit but she has not done that. She also admits to having some cognitive difficulties with decreased attention and poor short-term memory and at times having trouble remembering names and completing sentences. She has a lifelong history of depression and the past has tried multiple medications including Zoloft and Wellbutrin and Paxil but currently she is only on Klonopin which she takes for anxiety. She does participate in some stress relaxation activities like meditation daily but is not doing neck stretching exercises.She is on klonopin for anxiety but not on anything for depression. Update 12/01/2016 : she returns for follow-up after last visit  10 months ago. She is accompanied by her husband. Patient is complaining of increasing falls. She's fallen 3 times in gone to the emergency room and once she did not go She states she falls mostly backwards. She falls without warning. One fall she actually fell forwards.she has no warning and this happened suddenly and she cannot stop herself. She does not lose consciousness. She denies any chest pain and palpitations breaking out into sweats or blurred or loss of vision prior to his episodes. She was seen in the emergency room and [id number] and CT scan of the head that showed no acute abnormality. The tingling numbness in her feet which is occasionally they as well. Denies any weakness in her feet. Complains of long-standing tremors in her hands  occasionally. Past has taken propranolol which hashelped quite well.she denies any drooling of saliva, increased facial expressions short stepped gait. Or freezing while walking. Update 03/07/2017 : she returns for follow-up after last visit 3 months ago. She is accompanied by husband. She states she is doing well and she has had no further falls since her last visit with me. She did undergo EMG nerve conduction study done by Dr. 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 Carotid ultrasound done in Dr. Lianne Moris office showed no significant extra-cranial stenosis. Transcranial Doppler study showed low mean flow velocities throughout with increased pulsatility indexes likely due to diffuse intracranial atherosclerosis. Patient states that she does walk a little bit with a walker but needs her husband to be close to her so she does not fall. She does have some burning in her feet. She takes Pamelor at night as well as tramadol when necessary as needed. She remains on Plavix which is tolerating well without bleeding or bruising. She states her blood sugars are under good control. Her blood pressure normally is quite good but today it is  elevated at 151/78. Update 12/11/2017 ; she returns for follow-up after last visit 9 months ago.  She is accompanied by her husband.  Patient states that she has noted subjective worsening of her memory.  She has had trouble remembering names and more recently telephone numbers which were well-known to her.  She denies feeling depressed.  She has had no falls or injuries.  She denies any headaches.  She was having significant symptoms of orthostatic dizziness and Dr. Virgina Jock recently started her on Midodrin which seems to be helping.  She was seen today in his office and had lab work which showed significantly suppressed TSH of level 0.02.  She is on Synthroid 100 mg daily.  She was admitted in August 2019 with a fall to [facility].  She was discharged home with home physical therapy which she finished ~end of September.  She walks mostly endorsed using a walker but needs some help to get up and has to hold onto the husband until she feels better.  Her lab work from today shows serum creatinine of 2.0.  Hemoglobin A1c of 7.5 which actually is down from 7.9 from August.  She complains of diminished appetite and in fact has lost weight.  She has no family history of dementia.  Update 02/20/2018 : She returns for follow-up after last visit 2 months ago.  She is accompanied by her husband.  She continues to have memory difficulties and cognitive impairment which husband feels may have improved with the patient disagrees.  She had lab work for reversible causes of cognitive impairment done at her primary physician's office on 1//7/20 which was fairly unremarkable except for slightly elevated TSH.  She has seen a primary care physician and dose of Synthroid has been reduced to 75 mcg.Marland Kitchen  She was unable to obtain an MRI due to pacemaker but had CT scan of the head done instead on 12/21/2017.wWhich showed mild generalized atrophy and no acute findings.  EEG done on 01/12/2018 was normal.  She has difficulty ambulating  and uses a walker and a cane occasionally but mostly uses a scooter.  She is high risk for falls but has had no major injuries.  On Mini-Mental status score testing in the office today she scored 19 out of 30 which is significant decline from 30 out of 30 from a year ago.  REVIEW OF  SYSTEMS: Out of a complete 14 system review of symptoms, the patient complains only of the following symptoms, double vision, blurred vision, excessive sweating, leg swelling, incontinence of bladder, memory loss, headache, dizziness, weakness.  and all other systems negative ALLERGIES: Allergies  Allergen Reactions  . Iodine Other (See Comments)    ONLY IV--Makes unconscious  . Iohexol Other (See Comments)     Code: SOB, Desc: cardiac arrest w/ iv contrast, has never used 13 hr prep//alice calhoun, Onset Date: [id number]   . Metoclopramide Hcl Other (See Comments)    suicidal  . Sulfa Antibiotics Hives  . Lactose Intolerance (Gi) Diarrhea  . Lansoprazole Nausea And Vomiting    HOME MEDICATIONS: Outpatient Medications Prior to Visit  Medication Sig Dispense Refill  . acetaminophen (TYLENOL) 500 MG tablet Take 2 tablets (1,000 mg total) by mouth every 6 (six) hours as needed. 30 tablet 0  . albuterol (PROVENTIL HFA;VENTOLIN HFA) 108 (90 BASE) MCG/ACT inhaler Inhale 2 puffs into the lungs every 6 (six) hours as needed for wheezing.    . bisacodyl (FLEET) 10 MG/30ML ENEM Place 10 mg rectally once.    . busPIRone (BUSPAR) 10 MG tablet Take 1 tablet (10 mg total) by mouth every morning. 60 tablet 1  . Cholecalciferol (VITAMIN D-3 PO) Take 2 tablets by mouth every morning.     . clonazePAM (KLONOPIN) 0.5 MG tablet Take 0.5-1 mg by mouth See admin instructions. Takes 2 tabs in am and 1 tab in pm.    . clopidogrel (PLAVIX) 75 MG tablet Take 75 mg by mouth every morning.    . dicyclomine (BENTYL) 10 MG capsule Take 1 capsule (10 mg total) by mouth 3 (three) times daily before meals.    Marland Kitchen DM-APAP-CPM (VICKS FORMULA 44  COUGH/COLD PM PO) Take by mouth. Used 1/2 cup    . furosemide (LASIX) 20 MG tablet Take 1 tablet (20 mg total) by mouth daily as needed for fluid or edema. 90 tablet 3  . glipiZIDE (GLUCOTROL) 10 MG tablet Take 20 mg by mouth 2 (two) times daily before a meal.     . guaiFENesin (MUCINEX) 600 MG 12 hr tablet Take 600 mg by mouth 2 (two) times daily as needed for cough.    . insulin NPH (HUMULIN N,NOVOLIN N) 100 UNIT/ML injection Inject 0-20 Units into the skin See admin instructions. Novolin-N Give 20 unit in morning; give  5 units at 4pm; evening dose as needed.  If less than 150 blood sugar= 0units.  If greater than 200 blood sugar= give 10units to decrease by 100 blood sugar    . insulin regular (NOVOLIN R,HUMULIN R) 100 units/mL injection Inject 0-25 Units into the skin See admin instructions. Give 25 unit in morning; give 20units at 4pm; evening dose as needed.  If less than 150 blood sugar= 0units.  If greater than 200 blood sugar= give 10units to decrease by 100 blood sugar    . ipratropium (ATROVENT) 0.03 % nasal spray Place 2 sprays into both nostrils every 12 (twelve) hours. (Patient taking differently: Place 2 sprays into both nostrils every 12 (twelve) hours as needed for rhinitis. ) 30 mL 0  . levothyroxine (SYNTHROID, LEVOTHROID) 100 MCG tablet Take 88 mcg by mouth every morning.     . lidocaine (LIDODERM) 5 % Place 3 patches onto the skin daily as needed (pain). Remove & Discard patch within 12 hours or as directed by MD    . linaclotide (LINZESS) 290 MCG CAPS capsule Take 290 mcg  by mouth daily before breakfast.    . LORazepam (ATIVAN) 0.5 MG tablet Take 0.5 mg by mouth daily as needed for anxiety.    . magnesium 30 MG tablet Take 30 mg by mouth daily.    . midodrine (PROAMATINE) 2.5 MG tablet Take 1 tablet (2.5 mg total) by mouth 3 (three) times daily with meals. 90 tablet 6  . nortriptyline (PAMELOR) 50 MG capsule TAKE 2 CAPSULES (100MG ) AT BEDTIME (Patient taking differently: TAKE 1  CAPSULES (100MG ) AT two times daily) 180 capsule 1  . ondansetron (ZOFRAN) 4 MG tablet Take 4 mg by mouth as needed.  0  . OXYGEN Inhale 2 L into the lungs as needed. Used at overnight with a concentrator     . polyethylene glycol (MIRALAX / GLYCOLAX) packet Take 17 g by mouth daily as needed for mild constipation.     . potassium chloride SA (K-DUR,KLOR-CON) 20 MEQ tablet Take 1 tablet (20 mEq total) by mouth 2 (two) times daily. 10 tablet 0  . rosuvastatin (CRESTOR) 20 MG tablet Take 20 mg by mouth daily.     Marland Kitchen terconazole (TERAZOL 7) 0.4 % vaginal cream Place 1 applicator vaginally at bedtime as needed (for yeast infection).     . traMADol (ULTRAM) 50 MG tablet Take 1 tablet (50 mg total) by mouth every 12 (twelve) hours as needed for moderate pain. (Patient taking differently: Take 50 mg by mouth 2 (two) times daily as needed for moderate pain. ) 30 tablet 0  . vitamin B-12 (CYANOCOBALAMIN) 500 MCG tablet Take 1 tablet (500 mcg total) by mouth daily. 30 tablet 0  . solifenacin (VESICARE) 10 MG tablet Take 10 mg by mouth daily as needed (overactive bladder).      No facility-administered medications prior to visit.     PAST MEDICAL HISTORY: Past Medical History:  Diagnosis Date  . Anemia   . Anxiety   . Arthritis   . Asthma   . Brittle bone disease   . Cataract   . CKD (chronic kidney disease)   . Complication of anesthesia    hard to wake up after anesthesia, trouble turning head  . Coronary artery disease, non-occlusive 2014   a. minimal CAD by cath in 2014 with 40% RI stenosis. b. low-risk NST in 11/2014.  Marland Kitchen Depression   . Diabetes mellitus type 2, insulin dependent ([place])   . Diabetic neuropathy ([place])   . Diastolic dysfunction, left ventricle 11/30/14   EF 55%, grade 1 DD  . DVT (deep venous thrombosis) ([place])    "18 in RLE; 7 LLE prior to PE" (03/24/2015)  . Family history of adverse reaction to anesthesia    " the whole family is hard to wake up"  . Glaucoma   . H/O hiatal  hernia   . Hyperlipidemia   . Hypertension associated with diabetes ([place])   . Hypothyroidism   . Kidney stones    "passed them"  . Memory loss 06/03/2014  . On home oxygen therapy    "2L oxygen concentrator @ night"  . Osteoporosis   . Oxygen desaturation during sleep    wears 2 liters of oxygen at night   . Palpitations 35years ago   Cardionet monitor - revealed mostly normal sinus rhythm, sinus bradycardia with first-degree A-V block heart rates mostly in the 50s and 60s with some 70s. No arrhythmias, PVCs or PACs noted.  . 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 Pneumonia   . Presence of permanent cardiac pacemaker   . Restless legs syndrome   . Sleep apnea    cpap disontinued; test done 07/14/2008 ordered per  Byrum  . Spinal headache   . Spinal stenosis    L 3, L 4 and L 5, and C 1, C 2 and C3  . Stroke [facility]) 04-19-2013   tia and stroke. Weakness Rt hand  . 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 s/p [employer] PPM in 03/2015 by Dr. Lovena Le secondary to sinus node dysfunction.   Marland Kitchen TIA (transient ischemic attack) March 2014    PAST SURGICAL HISTORY: Past Surgical History:  Procedure Laterality Date  . ABDOMINAL HYSTERECTOMY    . APPENDECTOMY    . BACK SURGERY     steroid inj  . CATARACT EXTRACTION Bilateral   . CATARACT EXTRACTION W/ INTRAOCULAR LENS  IMPLANT, BILATERAL Bilateral 2000s  . CHOLECYSTECTOMY    . EP IMPLANTABLE DEVICE N/A 03/24/2015   [facility] [employer] PPM, Dr. Lovena Le  . ESOPHAGOGASTRODUODENOSCOPY  08/09/2011   Procedure: ESOPHAGOGASTRODUODENOSCOPY (EGD);  Surgeon: Jeryl Columbia, MD;  Location: [facility];  Service: Endoscopy;  Laterality: N/A;  . ESOPHAGOGASTRODUODENOSCOPY (EGD) WITH PROPOFOL N/A 11/12/2013   Procedure: ESOPHAGOGASTRODUODENOSCOPY (EGD) WITH PROPOFOL;  Surgeon: Jeryl Columbia, MD;  Location: [facility] ENDOSCOPY;  Service: Endoscopy;   Laterality: N/A;  . ESOPHAGOGASTRODUODENOSCOPY (EGD) WITH PROPOFOL N/A 05/05/2016   Procedure: ESOPHAGOGASTRODUODENOSCOPY (EGD) WITH PROPOFOL;  Surgeon: Clarene Essex, MD;  Location: [facility] ENDOSCOPY;  Service: Endoscopy;  Laterality: N/A;  . GLAUCOMA SURGERY Bilateral 2000s   "laser"  . HOT HEMOSTASIS  08/09/2011   Procedure: HOT HEMOSTASIS (ARGON PLASMA COAGULATION/BICAP);  Surgeon: Jeryl Columbia, MD;  Location: [facility];  Service: Endoscopy;  Laterality: N/A;  . HOT HEMOSTASIS N/A 11/12/2013   Procedure: HOT HEMOSTASIS (ARGON PLASMA COAGULATION/BICAP);  Surgeon: Jeryl Columbia, MD;  Location: [facility];  Service: Endoscopy;  Laterality: N/A;  . INSERT / REPLACE / REMOVE PACEMAKER  03/24/2015  . IRIDOTOMY / IRIDECTOMY Bilateral   . JOINT REPLACEMENT    . KNEE SURGERY Left done with 2nd left knee replacement   spacer bar placement  . LEFT HEART CATHETERIZATION WITH CORONARY ANGIOGRAM N/A 02/14/2012   Procedure: LEFT HEART CATHETERIZATION WITH CORONARY ANGIOGRAM;  Surgeon: Leonie Man, MD;  Location: [facility] CATH LAB;  Service: Cardiovascular:: no evidence of obstructive coronary disease ,low EDP with normal EF  . LEV DOPPLER  05/29/2012   RIGHT EXTREM. NORMAL VENOUS DUPLEX  . NM MYOCAR PERF WALL MOTION  01/26/2012; 11/2014   a. EF 79%,LV normal,ST SEGMENT CHANGE SUGGESTIVE OF ISCHEMIA.; LOW RISK, EF 60%. No ischemia or infarction. No wall motion abnormality   . PACEMAKER PLACEMENT    . REVISION TOTAL KNEE ARTHROPLASTY Left   . TONSILLECTOMY    . TOTAL KNEE ARTHROPLASTY Bilateral   . TRANSTHORACIC ECHOCARDIOGRAM  10/2016   EF 65-70%.  Vigorous LV function.  Mild LVH.  Poor acoustic windows.  Cannot assess diastolic function or significant valvular dysfunction.  Domingo Dimes ECHOCARDIOGRAM  2014; October 2016   a. EF 58-52%; normal diastolic pressures, no regional wall motion motion abnormalities;; b/ F 55-60%. Normal wall motion. GR 1 DD. No valve lesions  . TRIGGER FINGER RELEASE  11/22/2011    Procedure: RELEASE TRIGGER FINGER/A-1 PULLEY;  Surgeon: Meredith Pel, MD;  Location: [place];  Service: Orthopedics;  Laterality: Left;  Left trigger thumb release  .  TRIGGER FINGER RELEASE Right yrs ago    FAMILY HISTORY: Family History  Problem Relation Age of Onset  . Cancer Mother   . Diabetes Mother   . Cancer - Prostate Father   . Diabetes Father   . Retinal detachment Son   . Diabetes Sister   . Diabetes Brother   . Diabetes Paternal Grandmother     SOCIAL HISTORY: Social History   Socioeconomic History  . Marital status: Married    Spouse name: [name]  . Number of children: 2  . Years of education: college  . Highest education level: Not on file  Occupational History  . Occupation: retired  Scientific laboratory technician  . Financial resource strain: Not on file  . Food insecurity:    Worry: Not on file    Inability: Not on file  . Transportation needs:    Medical: Not on file    Non-medical: Not on file  Tobacco Use  . Smoking status: Never Smoker  . Smokeless tobacco: Never Used  Substance and Sexual Activity  . Alcohol use: No    Alcohol/week: 0.0 standard drinks  . Drug use: No  . Sexual activity: Yes  Lifestyle  . Physical activity:    Days per week: Not on file    Minutes per session: Not on file  . Stress: Not on file  Relationships  . Social connections:    Talks on phone: Not on file    Gets together: Not on file    Attends religious service: Not on file    Active member of club or organization: Not on file    Attends meetings of clubs or organizations: Not on file    Relationship status: Not on file  . Intimate partner violence:    Fear of current or ex partner: Not on file    Emotionally abused: Not on file    Physically abused: Not on file    Forced sexual activity: Not on file  Other Topics Concern  . Not on file  Social History Narrative   Married [name].) married 31 years.   Disabled.   Arboriculturist.   Right handed.   Caffeine two  sodas daily.       PHYSICAL EXAM  Vitals:   02/20/18 1508  BP: 124/70  Pulse: (!) 59  Weight: 252 lb 3.2 oz (114.4 kg)  Height: 5' 7.5" (1.715 m)   Body mass index is 38.92 kg/m. MMSE - Mini Mental State Exam 02/20/2018 04/12/2016 07/09/2015  Orientation to time 1 5 5   Orientation to Place 5 5 5   Registration 3 3 3   Attention/ Calculation 1 5 5   Recall 0 3 2  Language- name 2 objects 2 2 2   Language- repeat 1 1 1   Language- follow 3 step command 3 3 3   Language- read & follow direction 1 1 1   Write a sentence 1 1 1   Copy design 1 1 1   Total score 19 30 29     Generalized: Well developed obese elderly African-American lady, in no acute distress,   Neurological examination  Mentation: Alert oriented to time, place, history taking. Follows all commands speech and language fluent.  Glabellar tap absent.  Diminished recall 0/3.  Animal naming test 6 only.  Clock drawing 3/4. Cranial nerve II-XII: Pupils were equal round reactive to light. Extraocular movements were full, visual field were full on confrontational test. Facial sensation and strength were normal. Uvula tongue midline. Head turning and shoulder shrug  were normal and symmetric.  Motor: The motor testing reveals 5 over 5 strength LUE, LLE, RLE. 4/5 in RUE. Good symmetric motor tone is noted throughout.  Sensory: Sensory testing is intact to soft touch on all 4 extremities. No evidence of extinction is noted. Diminished vibration sensation of her ankles and toes bilaterally. Coordination: Cerebellar testing reveals good finger-nose-finger and heel-to-shin bilaterally.  Gait and station: Patient is in a wheelchair.needs two-person help to get up. Hence deferred Reflexes: Deep tendon reflexes are symmetric and Depressed bilaterally.   DIAGNOSTIC DATA (LABS, IMAGING, TESTING) - I reviewed patient records, labs, notes, testing and imaging myself where available.  Lab Results  Component Value Date   WBC 5.2 09/14/2017   HGB  9.5 (L) 09/14/2017   HCT 30.2 (L) 09/14/2017   MCV 101.0 (H) 09/14/2017   PLT 207 09/14/2017      Component Value Date/Time   NA 140 09/14/2017 0421   K 4.0 09/14/2017 0421   CL 104 09/14/2017 0421   CO2 24 09/14/2017 0421   GLUCOSE 239 (H) 09/14/2017 0421   BUN 19 09/14/2017 0421   CREATININE 1.66 (H) 09/14/2017 0421   CREATININE 1.64 (H) 04/09/2015 1438   CALCIUM 8.5 (L) 09/14/2017 0421   PROT 6.4 (L) 09/12/2017 1215   PROT 7.0 03/07/2017 1531   ALBUMIN 3.4 (L) 09/12/2017 1215   AST 22 09/12/2017 1215   ALT 12 09/12/2017 1215   ALKPHOS 66 09/12/2017 1215   BILITOT 0.6 09/12/2017 1215   GFRNONAA 28 (L) 09/14/2017 0421   GFRAA 33 (L) 09/14/2017 0421    Lab Results  Component Value Date   HGBA1C 7.9 (H) 03/07/2017   Lab Results  Component Value Date   VITAMINB12 282 09/13/2017   Lab Results  Component Value Date   TSH 4.194 09/12/2017      ASSESSMENT AND PLAN 78 y.o. year old female  has a past medical history of Anemia, Anxiety, Arthritis, Asthma, Brittle bone disease, Cataract, CKD (chronic kidney disease), Complication of anesthesia, Coronary artery disease, non-occlusive (2014), Depression, Diabetes mellitus type 2, insulin dependent ([place]), Diabetic neuropathy ([place]), Diastolic dysfunction, left ventricle (11/30/14), DVT (deep venous thrombosis) ([place]), Family history of adverse reaction to anesthesia, Glaucoma, H/O hiatal hernia, Hyperlipidemia, Hypertension associated with diabetes ([place]), Hypothyroidism, Kidney stones, Memory loss (06/03/2014), On home oxygen therapy, Osteoporosis, Oxygen desaturation during sleep, Palpitations (35years ago), PE (pulmonary thromboembolism) ([place]) (x 3, last one was ~ 2003), Pneumonia, Presence of permanent cardiac pacemaker, Restless legs syndrome, Sleep apnea, Spinal headache, Spinal stenosis, Stroke ([place]) (04-19-2013), Symptomatic bradycardia (03/24/2015), and TIA (transient ischemic attack) (March 2014). here with: New complaints of  memory difficulties due to mild cognitive impairment versus early dementia I had a long discussion with the patient and her husband regarding her memory loss and mild cognitive impairment and recommend trial of Aricept for improvement as her deficit persists despite correction of thyroid status and recent work-up for reversible causes otherwise has been unyielding..  I have discussed possible side effects with the patient and the husband and asked him to call me if needed.  Start 5 mg daily with food for a month increase if tolerated to 10 mg daily.  She was also encouraged to increase participation in cognitively challenging activities like solving crossword puzzles, playing bridge and sodoku.  We also discussed memory compensation strategies.  She may also consider taking Prevagen daily if she wants.  She was advised to continue Midodrin for orthostatic hypotension and to follow-up with the primary physician for the same.  She will  return for follow-up in 3 months or call earlier if necessary.Antony Contras, MD 02/20/2018, 4:40 PM [facility] [address] [place], [place] [address] [phone]

## 2018-02-21 DIAGNOSIS — I509 Heart failure, unspecified: Secondary | ICD-10-CM | POA: Diagnosis not present

## 2018-02-21 DIAGNOSIS — I10 Essential (primary) hypertension: Secondary | ICD-10-CM | POA: Diagnosis not present

## 2018-02-27 ENCOUNTER — Encounter: Payer: Self-pay | Admitting: Cardiology

## 2018-02-27 NOTE — Assessment & Plan Note (Signed)
Much better echo results from recent check.  Normal EF and only grade 1 diastolic dysfunction.  As long as her blood pressures are stable, she is doing well.  Remains euvolemic on exam rarely used as needed Lasix.  Allowing for mild permissive hypertension, but blood pressure looks pretty good.

## 2018-02-27 NOTE — Assessment & Plan Note (Signed)
Allowing for permissive hypertension.  No longer on ARB.  I do think there is component of neurogenic factors from peripheral neuropathy. Most recent falls were not related to syncope or orthostasis.  More related to balance issues.  Continue to hold antihypertensive agents.  Lasix only use PRN.

## 2018-02-27 NOTE — Assessment & Plan Note (Signed)
Followed by PCP.  She asked about possibly stopping statin.  I will defer to PCP, but based on her recent labs I think would not be unreasonable if it would help with some of her weakness and fatigue.

## 2018-02-27 NOTE — Assessment & Plan Note (Signed)
Multifactorial but mostly related to obesity and deconditioning.

## 2018-02-27 NOTE — Assessment & Plan Note (Signed)
Doing well status post pacemaker.  Being followed by EP.

## 2018-02-27 NOTE — Assessment & Plan Note (Signed)
Losing some weight, but needs to continue staying physically active with physical therapy.

## 2018-03-04 NOTE — Progress Notes (Signed)
[facility] Note  03/06/2018     CHIEF COMPLAINT Patient presents for Retina Follow Up   HISTORY OF PRESENT ILLNESS: Tara Jackson is a 78 y.o. female who presents to the clinic today for:   HPI    Retina Follow Up    Patient presents with  Retinal Break/Detachment.  In both eyes.  This started 3 months ago.  Severity is mild.  Since onset it is stable.  I, the attending physician,  performed the HPI with the patient and updated documentation appropriately.          Comments    F/U EXU ARMD W/PED OU. Patient states her vision remain the same as last ov, denies new visual onsets/issues.Bs 212(03/05/18) @ Hs, Denies hypo/hyperglycemic episodes. Pt reports she had neurological eval 02/20/18, with 'good results" per patient .       Last edited by Bernarda Caffey, MD on 03/06/2018  3:26 PM. (History)     Vision stable OU since last visit.  Noticed intermittent vertical diplopia OS when laying on right side.  Noticed more when watching tv.  Referring physician: Shon Baton, MD [address] [place], [place] [address]  HISTORICAL INFORMATION:   Selected notes from the MEDICAL RECORD NUMBER Referred by Dr. Monna Fam for concern of unusual large drusenoid, epi detachement LEE: 11.11.19 (K. Hecker) [BCVA: OD: 20/25++ OS: 20/20--] Ocular Hx-cataracts OU, glaucoma OU PMH-anemia, anxiety, arthritis, asthma, CKD, CAD, depression, DM(takes Novolin and glipizide), HLD, HTN,stroke    CURRENT MEDICATIONS: No current outpatient medications on file. (Ophthalmic Drugs)   No current facility-administered medications for this visit.  (Ophthalmic Drugs)   Current Outpatient Medications (Other)  Medication Sig  . acetaminophen (TYLENOL) 500 MG tablet Take 2 tablets (1,000 mg total) by mouth every 6 (six) hours as needed.  Marland Kitchen albuterol (PROVENTIL HFA;VENTOLIN HFA) 108 (90 BASE) MCG/ACT inhaler Inhale 2 puffs into the lungs every 6 (six) hours as needed for wheezing.   . bisacodyl (FLEET) 10 MG/30ML ENEM Place 10 mg rectally once.  . busPIRone (BUSPAR) 10 MG tablet Take 1 tablet (10 mg total) by mouth every morning.  . Cholecalciferol (VITAMIN D-3 PO) Take 2 tablets by mouth every morning.   . clonazePAM (KLONOPIN) 0.5 MG tablet Take 0.5-1 mg by mouth See admin instructions. Takes 2 tabs in am and 1 tab in pm.  . clopidogrel (PLAVIX) 75 MG tablet Take 75 mg by mouth every morning.  . dicyclomine (BENTYL) 10 MG capsule Take 1 capsule (10 mg total) by mouth 3 (three) times daily before meals.  Marland Kitchen DM-APAP-CPM (VICKS FORMULA 44 COUGH/COLD PM PO) Take by mouth. Used 1/2 cup  . donepezil (ARICEPT) 10 MG tablet Take 1 tablet (10 mg total) by mouth at bedtime. Start 1/2 tablet daily with food x 4 weeks then 1 tablet daily  . glipiZIDE (GLUCOTROL) 10 MG tablet Take 20 mg by mouth 2 (two) times daily before a meal.   . guaiFENesin (MUCINEX) 600 MG 12 hr tablet Take 600 mg by mouth 2 (two) times daily as needed for cough.  . insulin NPH (HUMULIN N,NOVOLIN N) 100 UNIT/ML injection Inject 0-20 Units into the skin See admin instructions. Novolin-N Give 20 unit in morning; give  5 units at 4pm; evening dose as needed.  If less than 150 blood sugar= 0units.  If greater than 200 blood sugar= give 10units to decrease by 100 blood sugar  . insulin regular (NOVOLIN R,HUMULIN R) 100 units/mL injection Inject 0-25 Units  into the skin See admin instructions. Give 25 unit in morning; give 20units at 4pm; evening dose as needed.  If less than 150 blood sugar= 0units.  If greater than 200 blood sugar= give 10units to decrease by 100 blood sugar  . ipratropium (ATROVENT) 0.03 % nasal spray Place 2 sprays into both nostrils every 12 (twelve) hours. (Patient taking differently: Place 2 sprays into both nostrils every 12 (twelve) hours as needed for rhinitis. )  . levothyroxine (SYNTHROID, LEVOTHROID) 100 MCG tablet Take 88 mcg by mouth every morning.   . lidocaine (LIDODERM) 5 % Place 3  patches onto the skin daily as needed (pain). Remove & Discard patch within 12 hours or as directed by MD  . linaclotide (LINZESS) 290 MCG CAPS capsule Take 290 mcg by mouth daily before breakfast.  . LORazepam (ATIVAN) 0.5 MG tablet Take 0.5 mg by mouth daily as needed for anxiety.  . magnesium 30 MG tablet Take 30 mg by mouth daily.  . midodrine (PROAMATINE) 2.5 MG tablet Take 1 tablet (2.5 mg total) by mouth 3 (three) times daily with meals.  . nortriptyline (PAMELOR) 50 MG capsule TAKE 2 CAPSULES (100MG ) AT BEDTIME (Patient taking differently: TAKE 1 CAPSULES (100MG ) AT two times daily)  . ondansetron (ZOFRAN) 4 MG tablet Take 4 mg by mouth as needed.  . OXYGEN Inhale 2 L into the lungs as needed. Used at overnight with a concentrator   . polyethylene glycol (MIRALAX / GLYCOLAX) packet Take 17 g by mouth daily as needed for mild constipation.   . potassium chloride SA (K-DUR,KLOR-CON) 20 MEQ tablet Take 1 tablet (20 mEq total) by mouth 2 (two) times daily.  . rosuvastatin (CRESTOR) 20 MG tablet Take 20 mg by mouth daily.   . solifenacin (VESICARE) 10 MG tablet Take 10 mg by mouth daily as needed (overactive bladder).   Marland Kitchen terconazole (TERAZOL 7) 0.4 % vaginal cream Place 1 applicator vaginally at bedtime as needed (for yeast infection).   . traMADol (ULTRAM) 50 MG tablet Take 1 tablet (50 mg total) by mouth every 12 (twelve) hours as needed for moderate pain. (Patient taking differently: Take 50 mg by mouth 2 (two) times daily as needed for moderate pain. )  . vitamin B-12 (CYANOCOBALAMIN) 500 MCG tablet Take 1 tablet (500 mcg total) by mouth daily.  . furosemide (LASIX) 20 MG tablet Take 1 tablet (20 mg total) by mouth daily as needed for fluid or edema.   No current facility-administered medications for this visit.  (Other)      REVIEW OF SYSTEMS: ROS    Positive for: Endocrine, Eyes   Negative for: Constitutional, Gastrointestinal, Neurological, Skin, Genitourinary, Musculoskeletal,  HENT, Cardiovascular, Respiratory, Psychiatric, Allergic/Imm, Heme/Lymph   Last edited by Zenovia Jordan, LPN on 1/94/1740  8:14 PM. (History)       ALLERGIES Allergies  Allergen Reactions  . Iodine Other (See Comments)    ONLY IV--Makes unconscious  . Iohexol Other (See Comments)     Code: SOB, Desc: cardiac arrest w/ iv contrast, has never used 13 hr prep//alice calhoun, Onset Date: [id number]   . Metoclopramide Hcl Other (See Comments)    suicidal  . Sulfa Antibiotics Hives  . Lactose Intolerance (Gi) Diarrhea  . Lansoprazole Nausea And Vomiting    PAST MEDICAL HISTORY Past Medical History:  Diagnosis Date  . Anemia   . Anxiety   . Arthritis   . Asthma   . Brittle bone disease   . Cataract   . CKD (chronic  kidney disease)   . Complication of anesthesia    hard to wake up after anesthesia, trouble turning head  . Coronary artery disease, non-occlusive 2014   a. minimal CAD by cath in 2014 with 40% RI stenosis. b. low-risk NST in 11/2014.  Marland Kitchen Depression   . Diabetes mellitus type 2, insulin dependent ([place])   . Diabetic neuropathy ([place])   . Diastolic dysfunction, left ventricle 11/30/14   EF 55%, grade 1 DD  . DVT (deep venous thrombosis) ([place])    "18 in RLE; 7 LLE prior to PE" (03/24/2015)  . Family history of adverse reaction to anesthesia    " the whole family is hard to wake up"  . Glaucoma   . H/O hiatal hernia   . Hyperlipidemia   . Hypertension associated with diabetes ([place])   . Hypothyroidism   . Kidney stones    "passed them"  . Memory loss 06/03/2014  . On home oxygen therapy    "2L oxygen concentrator @ night"  . Osteoporosis   . Oxygen desaturation during sleep    wears 2 liters of oxygen at night   . Palpitations 35years ago   Cardionet monitor - revealed mostly normal sinus rhythm, sinus bradycardia with first-degree A-V block heart rates mostly in the 50s and 60s with some 70s. No arrhythmias, PVCs or PACs noted.  . PE (pulmonary thromboembolism)  ([place]) x 3, last one was ~ 2003   history of recurrent RLE DVT with PE - last PE ~>13 yrs; Maintained on Plavix  . Pneumonia   . Presence of permanent cardiac pacemaker   . Restless legs syndrome   . Sleep apnea    cpap disontinued; test done 07/14/2008 ordered per  Byrum  . Spinal headache   . Spinal stenosis    L 3, L 4 and L 5, and C 1, C 2 and C3  . Stroke [facility]) 04-19-2013   tia and stroke. Weakness Rt hand  . 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 s/p [employer] PPM in 03/2015 by Dr. Lovena Le secondary to sinus node dysfunction.   Marland Kitchen TIA (transient ischemic attack) March 2014   Past Surgical History:  Procedure Laterality Date  . ABDOMINAL HYSTERECTOMY    . APPENDECTOMY    . BACK SURGERY     steroid inj  . CATARACT EXTRACTION Bilateral   . CATARACT EXTRACTION W/ INTRAOCULAR LENS  IMPLANT, BILATERAL Bilateral 2000s  . CHOLECYSTECTOMY    . EP IMPLANTABLE DEVICE N/A 03/24/2015   [facility] PPM, Dr. Lovena Le  . ESOPHAGOGASTRODUODENOSCOPY  08/09/2011   Procedure: ESOPHAGOGASTRODUODENOSCOPY (EGD);  Surgeon: Jeryl Columbia, MD;  Location: [facility];  Service: Endoscopy;  Laterality: N/A;  . ESOPHAGOGASTRODUODENOSCOPY (EGD) WITH PROPOFOL N/A 11/12/2013   Procedure: ESOPHAGOGASTRODUODENOSCOPY (EGD) WITH PROPOFOL;  Surgeon: Jeryl Columbia, MD;  Location: [facility] ENDOSCOPY;  Service: Endoscopy;  Laterality: N/A;  . ESOPHAGOGASTRODUODENOSCOPY (EGD) WITH PROPOFOL N/A 05/05/2016   Procedure: ESOPHAGOGASTRODUODENOSCOPY (EGD) WITH PROPOFOL;  Surgeon: Clarene Essex, MD;  Location: [facility] ENDOSCOPY;  Service: Endoscopy;  Laterality: N/A;  . GLAUCOMA SURGERY Bilateral 2000s   "laser"  . HOT HEMOSTASIS  08/09/2011   Procedure: HOT HEMOSTASIS (ARGON PLASMA COAGULATION/BICAP);  Surgeon: Jeryl Columbia, MD;  Location: [facility];  Service: Endoscopy;  Laterality: N/A;  . HOT HEMOSTASIS N/A 11/12/2013   Procedure: HOT HEMOSTASIS (ARGON PLASMA COAGULATION/BICAP);  Surgeon: Jeryl Columbia, MD;  Location: [facility]  ENDOSCOPY;  Service: Endoscopy;  Laterality: N/A;  . INSERT / REPLACE / REMOVE PACEMAKER  03/24/2015  . IRIDOTOMY / IRIDECTOMY Bilateral   . JOINT REPLACEMENT    . KNEE SURGERY Left done with 2nd left knee replacement   spacer bar placement  . LEFT HEART CATHETERIZATION WITH CORONARY ANGIOGRAM N/A 02/14/2012   Procedure: LEFT HEART CATHETERIZATION WITH CORONARY ANGIOGRAM;  Surgeon: Leonie Man, MD;  Location[facility] CATH LAB;  Service: Cardiovascular:: no evidence of obstructive coronary disease ,low EDP with normal EF  . LEV DOPPLER  05/29/2012   RIGHT EXTREM. NORMAL VENOUS DUPLEX  . NM MYOCAR PERF WALL MOTION  01/26/2012; 11/2014   a. EF 79%,LV normal,ST SEGMENT CHANGE SUGGESTIVE OF ISCHEMIA.; LOW RISK, EF 60%. No ischemia or infarction. No wall motion abnormality   . PACEMAKER PLACEMENT    . REVISION TOTAL KNEE ARTHROPLASTY Left   . TONSILLECTOMY    . TOTAL KNEE ARTHROPLASTY Bilateral   . TRANSTHORACIC ECHOCARDIOGRAM  11/2017   11/2017: EF 60-65%. Gr1 DD. PAP ~33 mmHg.  Mild TR. PPM leads in RA & RV.   Marland Kitchen TRANSTHORACIC ECHOCARDIOGRAM  2014; October 2016   a. EF 06-30%; normal diastolic pressures, no regional wall motion motion abnormalities;; b/ F 55-60%. Normal wall motion. GR 1 DD. No valve lesions  . TRIGGER FINGER RELEASE  11/22/2011   Procedure: RELEASE TRIGGER FINGER/A-1 PULLEY;  Surgeon: Meredith Pel, MD;  Location: [place];  Service: Orthopedics;  Laterality: Left;  Left trigger thumb release  . TRIGGER FINGER RELEASE Right yrs ago    FAMILY HISTORY Family History  Problem Relation Age of Onset  . Cancer Mother   . Diabetes Mother   . Cancer - Prostate Father   . Diabetes Father   . Retinal detachment Son   . Diabetes Sister   . Diabetes Brother   . Diabetes Paternal Grandmother     SOCIAL HISTORY Social History   Tobacco Use  . Smoking status: Never Smoker  . Smokeless tobacco: Never Used  Substance Use Topics  . Alcohol use: No    Alcohol/week: 0.0 standard  drinks  . Drug use: No         OPHTHALMIC EXAM:  Base Eye Exam    Visual Acuity (Snellen - Linear)      Right Left   Dist [place] 20/25 -1 20/100 -1   Dist ph [place] NI 20/40       Tonometry (Tonopen, 2:18 PM)      Right Left   Pressure 16 14       Pupils      Dark Light Shape React APD   Right 3 2 Round Brisk None   Left 3 2 Round Brisk None       Visual Fields      Left Right    Full Full       Extraocular Movement      Right Left    Full, Ortho Full, Ortho       Neuro/Psych    Oriented x3:  Yes   Mood/Affect:  Normal       Dilation    Both eyes:  1.0% Mydriacyl, 2.5% Phenylephrine @ 2:12 PM        Slit Lamp and Fundus Exam    Slit Lamp Exam      Right Left   Lids/Lashes  Dermatochalasis - lower lid, Dermatochalasis - upper lid, Meibomian gland dysfunction Dermatochalasis - lower lid, Dermatochalasis - upper lid, Meibomian gland dysfunction   Conjunctiva/Sclera mild melanosis White and quiet   Cornea arcus, 1+ PEE, 2-3+ guttata, no  edema arcus, well healed temporal cataract wound, 1+PEE, 2+ guttata   Anterior Chamber Deep and quiet Deep and quiet   Iris round, no NVI, moderately dilated Round, mild atrophy inferotemporal, mildly dilated   Lens PCIOL PC IOL   Vitreous Vitreous syneresis Vitreous syneresis       Fundus Exam      Right Left   Disc pink and sharp, central cupping pink and sharp   C/D Ratio 0.7 0.6   Macula blunted foveal reflex, RPE motltling and clumping, few MA, PED, RPE atrophy large PED nasal macula, blunted foveal reflex, scattered MA   Vessels  Vascular attenuation Vascular attenuation, mildly tortuous.   Periphery attached attached          IMAGING AND PROCEDURES  Imaging and Procedures for @TODAY @  OCT, Retina - OU - Both Eyes       Right Eye Quality was good. Central Foveal Thickness: 212. Progression has been stable. Findings include normal foveal contour, no IRF, no SRF, pigment epithelial detachment, epiretinal membrane,  macular pucker, retinal drusen , outer retinal atrophy (stable).   Left Eye Quality was good. Central Foveal Thickness: 206. Progression has been stable. Findings include pigment epithelial detachment, normal foveal contour, no IRF, no SRF, retinal drusen  (large nasal PED -- no IRF/SRF).   Notes Diagnosis / Impression:  OD: NFP, no IRF/SRF, +focal ORA and PED, +ERM with mild pucker--stable OS: large nasal PED without IRF/SRF--stable  Clinical management:  See below  Abbreviations: NFP - Normal foveal profile. CME - cystoid macular edema. PED - pigment epithelial detachment. IRF - intraretinal fluid. SRF - subretinal fluid. EZ - ellipsoid zone. ERM - epiretinal membrane. ORA - outer retinal atrophy. ORT - outer retinal tubulation. SRHM - subretinal hyper-reflective material                 ASSESSMENT/PLAN:    ICD-10-CM   1. 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 Retinal edema H35.81 OCT, Retina - OU - Both Eyes  4. Moderate nonproliferative diabetic retinopathy of both eyes without macular edema associated with type 2 diabetes mellitus ([place]) Y60.6301   5. Essential hypertension I10   6. Hypertensive retinopathy of both eyes H35.033   7. Pseudophakia of both eyes Z96.1   8. 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 Exudative ARMD w/ PED OU (OS > OD)  - The incidence pathology and anatomy of wet AMD discussed   - The ANCHOR, MARINA, CATT and VIEW trials discussed with patient.    - discussed treatment options including observation vs intravitreal anti-VEGF agents such as Avastin, Lucentis, Eylea.    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 Mild nonproliferative diabetic retinopathy w/o DME, both eyes - The incidence, risk factors for progression, natural history and treatment options for diabetic retinopathy were discussed with patient.   - The need for close monitoring of blood glucose, blood pressure, and serum lipids, avoiding cigarette or any type of tobacco, and the need for long term follow up was also discussed with patient. - exam with scattered MA, IRH OU - FA shows mild MA; no NV - OCT without diabetic macular edema, both eyes   5,6. Hypertensive retinopathy OU - discussed importance of tight BP control - monitor  7. Pseudophakia OU  - s/p CE/IOL  - beautiful surgeries, doing well  - monitor  8. Fuch's endothelial dystrophy - mild guttata OU, no edema   Ophthalmic Meds Ordered this visit:  No orders of the defined types were placed in this encounter.      Return 8 weeks, for Dilated exam, OCT, FA transit OS.  There are no Patient Instructions on file for this visit.   Explained the diagnoses, plan, and follow up with the patient and they expressed understanding.  Patient expressed understanding of the importance of proper follow up care.   This document serves as a record of services personally performed by Gardiner Sleeper, MD, PhD. It was created on their behalf by Ernest Mallick, OA, an ophthalmic assistant. The creation of this record is the provider's dictation and/or activities during the visit.     Electronically signed by: Ernest Mallick, OA  01.26.2020 11:39 AM    Gardiner Sleeper, M.D., Ph.D. Diseases & Surgery of the Retina and Vitreous [facility]  I have reviewed the above documentation for accuracy and completeness, and I agree with the above. Gardiner Sleeper, M.D., Ph.D. 03/07/18 11:40 AM     Abbreviations: M myopia (nearsighted); A astigmatism; H hyperopia (farsighted); P presbyopia; Mrx spectacle prescription;  CTL contact lenses; OD right eye; OS left eye; OU both eyes  XT exotropia; ET esotropia; PEK punctate epithelial keratitis; PEE punctate epithelial erosions; DES dry eye syndrome; MGD meibomian gland dysfunction; ATs artificial tears; PFAT's preservative free artificial tears; [place]eno nuclear sclerotic cataract; PSC posterior subcapsular cataract; ERM epi-retinal membrane; PVD posterior vitreous detachment; RD retinal detachment; DM diabetes mellitus; DR diabetic retinopathy; NPDR non-proliferative diabetic retinopathy; PDR proliferative diabetic retinopathy; CSME clinically significant macular edema; DME diabetic macular edema; dbh dot blot hemorrhages; CWS cotton wool spot; POAG primary open angle glaucoma; C/D cup-to-disc ratio; HVF humphrey visual field; GVF goldmann visual field; OCT optical coherence tomography; IOP intraocular pressure; BRVO Branch retinal vein occlusion; CRVO central retinal vein occlusion; CRAO central retinal artery occlusion; BRAO branch retinal artery occlusion; RT retinal tear; SB scleral buckle; PPV pars plana vitrectomy; VH Vitreous hemorrhage; PRP panretinal laser photocoagulation; IVK intravitreal kenalog; VMT vitreomacular traction; MH Macular hole;  NVD neovascularization of the disc; NVE neovascularization elsewhere; AREDS age related eye disease study; ARMD age related macular degeneration; POAG primary open angle glaucoma; EBMD epithelial/anterior basement membrane dystrophy; ACIOL anterior chamber intraocular lens; IOL  intraocular lens; PCIOL posterior chamber intraocular lens; Phaco/IOL phacoemulsification with intraocular lens placement; Dix photorefractive keratectomy; LASIK laser assisted in situ keratomileusis; HTN hypertension; DM diabetes mellitus; COPD chronic obstructive pulmonary disease

## 2018-03-06 ENCOUNTER — Encounter (INDEPENDENT_AMBULATORY_CARE_PROVIDER_SITE_OTHER): Payer: Self-pay | Admitting: Ophthalmology

## 2018-03-06 ENCOUNTER — Ambulatory Visit (INDEPENDENT_AMBULATORY_CARE_PROVIDER_SITE_OTHER): Payer: Medicare HMO | Admitting: Ophthalmology

## 2018-03-06 DIAGNOSIS — H3581 Retinal edema: Secondary | ICD-10-CM | POA: Diagnosis not present

## 2018-03-06 DIAGNOSIS — Z961 Presence of intraocular lens: Secondary | ICD-10-CM

## 2018-03-06 DIAGNOSIS — H1851 Endothelial corneal dystrophy: Secondary | ICD-10-CM

## 2018-03-06 DIAGNOSIS — H35033 Hypertensive retinopathy, bilateral: Secondary | ICD-10-CM

## 2018-03-06 DIAGNOSIS — I10 Essential (primary) hypertension: Secondary | ICD-10-CM | POA: Diagnosis not present

## 2018-03-06 DIAGNOSIS — H353232 Exudative age-related macular degeneration, bilateral, with inactive choroidal neovascularization: Secondary | ICD-10-CM

## 2018-03-06 DIAGNOSIS — E113393 Type 2 diabetes mellitus with moderate nonproliferative diabetic retinopathy without macular edema, bilateral: Secondary | ICD-10-CM | POA: Diagnosis not present

## 2018-03-06 DIAGNOSIS — H35723 Serous detachment of retinal pigment epithelium, bilateral: Secondary | ICD-10-CM

## 2018-03-06 DIAGNOSIS — Z794 Long term (current) use of insulin: Secondary | ICD-10-CM | POA: Diagnosis not present

## 2018-03-06 DIAGNOSIS — E118 Type 2 diabetes mellitus with unspecified complications: Secondary | ICD-10-CM | POA: Diagnosis not present

## 2018-03-06 DIAGNOSIS — H18519 Endothelial corneal dystrophy, unspecified eye: Secondary | ICD-10-CM

## 2018-03-07 ENCOUNTER — Encounter (INDEPENDENT_AMBULATORY_CARE_PROVIDER_SITE_OTHER): Payer: Self-pay | Admitting: Ophthalmology

## 2018-03-15 ENCOUNTER — Ambulatory Visit (INDEPENDENT_AMBULATORY_CARE_PROVIDER_SITE_OTHER): Payer: Medicare HMO

## 2018-03-15 DIAGNOSIS — I495 Sick sinus syndrome: Secondary | ICD-10-CM | POA: Diagnosis not present

## 2018-03-15 DIAGNOSIS — I5032 Chronic diastolic (congestive) heart failure: Secondary | ICD-10-CM

## 2018-03-16 LAB — CUP PACEART REMOTE DEVICE CHECK
Battery Remaining Longevity: 132 mo
Battery Remaining Percentage: 100 %
Brady Statistic RA Percent Paced: 75 %
Brady Statistic RV Percent Paced: 57 %
Date Time Interrogation Session: 20200206085300
Implantable Lead Implant Date: 20170214
Implantable Lead Implant Date: 20170214
Implantable Lead Location: 753859
Implantable Lead Location: 753860
Implantable Lead Model: 7740
Implantable Lead Model: 7741
Implantable Lead Serial Number: 649859
Implantable Lead Serial Number: 728741
Implantable Pulse Generator Implant Date: 20170214
Lead Channel Impedance Value: 644 Ohm
Lead Channel Impedance Value: 669 Ohm
Lead Channel Pacing Threshold Amplitude: 0.6 V
Lead Channel Pacing Threshold Amplitude: 1.2 V
Lead Channel Pacing Threshold Pulse Width: 0.4 ms
Lead Channel Pacing Threshold Pulse Width: 0.4 ms
Lead Channel Setting Pacing Amplitude: 1.8 V
Lead Channel Setting Pacing Amplitude: 2 V
Lead Channel Setting Pacing Pulse Width: 0.4 ms
Lead Channel Setting Sensing Sensitivity: 2.5 mV
Pulse Gen Serial Number: 718098

## 2018-03-22 DIAGNOSIS — G4733 Obstructive sleep apnea (adult) (pediatric): Secondary | ICD-10-CM | POA: Diagnosis not present

## 2018-03-22 DIAGNOSIS — Z950 Presence of cardiac pacemaker: Secondary | ICD-10-CM | POA: Diagnosis not present

## 2018-03-22 DIAGNOSIS — E1122 Type 2 diabetes mellitus with diabetic chronic kidney disease: Secondary | ICD-10-CM | POA: Diagnosis not present

## 2018-03-22 DIAGNOSIS — N189 Chronic kidney disease, unspecified: Secondary | ICD-10-CM | POA: Diagnosis not present

## 2018-03-22 DIAGNOSIS — I509 Heart failure, unspecified: Secondary | ICD-10-CM | POA: Diagnosis not present

## 2018-03-22 DIAGNOSIS — I129 Hypertensive chronic kidney disease with stage 1 through stage 4 chronic kidney disease, or unspecified chronic kidney disease: Secondary | ICD-10-CM | POA: Diagnosis not present

## 2018-03-22 DIAGNOSIS — E785 Hyperlipidemia, unspecified: Secondary | ICD-10-CM | POA: Diagnosis not present

## 2018-03-22 DIAGNOSIS — N184 Chronic kidney disease, stage 4 (severe): Secondary | ICD-10-CM | POA: Diagnosis not present

## 2018-03-22 DIAGNOSIS — I2510 Atherosclerotic heart disease of native coronary artery without angina pectoris: Secondary | ICD-10-CM | POA: Diagnosis not present

## 2018-03-22 DIAGNOSIS — D631 Anemia in chronic kidney disease: Secondary | ICD-10-CM | POA: Diagnosis not present

## 2018-03-22 DIAGNOSIS — E039 Hypothyroidism, unspecified: Secondary | ICD-10-CM | POA: Diagnosis not present

## 2018-03-24 DIAGNOSIS — I10 Essential (primary) hypertension: Secondary | ICD-10-CM | POA: Diagnosis not present

## 2018-03-24 DIAGNOSIS — I509 Heart failure, unspecified: Secondary | ICD-10-CM | POA: Diagnosis not present

## 2018-03-27 NOTE — Progress Notes (Signed)
Remote pacemaker transmission.   

## 2018-04-03 ENCOUNTER — Ambulatory Visit
Admission: RE | Admit: 2018-04-03 | Discharge: 2018-04-03 | Disposition: A | Discharge status: Home / Self Care | Payer: Medicare HMO | Source: Ambulatory Visit | Attending: Gastroenterology | Admitting: Gastroenterology

## 2018-04-03 ENCOUNTER — Other Ambulatory Visit: Payer: Self-pay | Admitting: Gastroenterology

## 2018-04-03 DIAGNOSIS — R1084 Generalized abdominal pain: Secondary | ICD-10-CM

## 2018-04-03 DIAGNOSIS — K5900 Constipation, unspecified: Secondary | ICD-10-CM | POA: Diagnosis not present

## 2018-04-04 ENCOUNTER — Other Ambulatory Visit: Payer: Self-pay | Admitting: Gastroenterology

## 2018-04-04 DIAGNOSIS — R935 Abnormal findings on diagnostic imaging of other abdominal regions, including retroperitoneum: Secondary | ICD-10-CM

## 2018-04-10 ENCOUNTER — Inpatient Hospital Stay: Admission: RE | Admit: 2018-04-10 | Payer: Medicare HMO | Source: Ambulatory Visit

## 2018-04-10 ENCOUNTER — Ambulatory Visit
Admission: RE | Admit: 2018-04-10 | Discharge: 2018-04-10 | Disposition: A | Discharge status: Home / Self Care | Payer: Medicare HMO | Source: Ambulatory Visit | Attending: Gastroenterology | Admitting: Gastroenterology

## 2018-04-10 DIAGNOSIS — R935 Abnormal findings on diagnostic imaging of other abdominal regions, including retroperitoneum: Secondary | ICD-10-CM

## 2018-04-17 DIAGNOSIS — E1140 Type 2 diabetes mellitus with diabetic neuropathy, unspecified: Secondary | ICD-10-CM | POA: Diagnosis not present

## 2018-04-17 DIAGNOSIS — M810 Age-related osteoporosis without current pathological fracture: Secondary | ICD-10-CM | POA: Diagnosis not present

## 2018-04-17 DIAGNOSIS — E038 Other specified hypothyroidism: Secondary | ICD-10-CM | POA: Diagnosis not present

## 2018-04-18 DIAGNOSIS — R82998 Other abnormal findings in urine: Secondary | ICD-10-CM | POA: Diagnosis not present

## 2018-04-22 DIAGNOSIS — I10 Essential (primary) hypertension: Secondary | ICD-10-CM | POA: Diagnosis not present

## 2018-04-22 DIAGNOSIS — I509 Heart failure, unspecified: Secondary | ICD-10-CM | POA: Diagnosis not present

## 2018-04-24 DIAGNOSIS — E11319 Type 2 diabetes mellitus with unspecified diabetic retinopathy without macular edema: Secondary | ICD-10-CM | POA: Diagnosis not present

## 2018-04-24 DIAGNOSIS — H3530 Unspecified macular degeneration: Secondary | ICD-10-CM | POA: Diagnosis not present

## 2018-04-24 DIAGNOSIS — Z9981 Dependence on supplemental oxygen: Secondary | ICD-10-CM | POA: Diagnosis not present

## 2018-04-24 DIAGNOSIS — R1319 Other dysphagia: Secondary | ICD-10-CM | POA: Diagnosis not present

## 2018-04-24 DIAGNOSIS — Z0000 Encounter for general adult medical examination without abnormal findings: Secondary | ICD-10-CM | POA: Diagnosis not present

## 2018-04-24 DIAGNOSIS — H532 Diplopia: Secondary | ICD-10-CM | POA: Diagnosis not present

## 2018-04-24 DIAGNOSIS — J40 Bronchitis, not specified as acute or chronic: Secondary | ICD-10-CM | POA: Diagnosis not present

## 2018-04-24 DIAGNOSIS — E1140 Type 2 diabetes mellitus with diabetic neuropathy, unspecified: Secondary | ICD-10-CM | POA: Diagnosis not present

## 2018-04-24 DIAGNOSIS — I951 Orthostatic hypotension: Secondary | ICD-10-CM | POA: Diagnosis not present

## 2018-04-24 DIAGNOSIS — I519 Heart disease, unspecified: Secondary | ICD-10-CM | POA: Diagnosis not present

## 2018-05-01 ENCOUNTER — Encounter (INDEPENDENT_AMBULATORY_CARE_PROVIDER_SITE_OTHER): Payer: Medicare HMO | Admitting: Ophthalmology

## 2018-05-14 ENCOUNTER — Other Ambulatory Visit: Payer: Self-pay | Admitting: Neurology

## 2018-05-23 ENCOUNTER — Telehealth: Payer: Self-pay

## 2018-05-23 DIAGNOSIS — G4733 Obstructive sleep apnea (adult) (pediatric): Secondary | ICD-10-CM | POA: Diagnosis not present

## 2018-05-23 DIAGNOSIS — I509 Heart failure, unspecified: Secondary | ICD-10-CM | POA: Diagnosis not present

## 2018-05-23 DIAGNOSIS — I10 Essential (primary) hypertension: Secondary | ICD-10-CM | POA: Diagnosis not present

## 2018-05-23 NOTE — Telephone Encounter (Signed)
I called pt and husband about appt 4/21 will be change to video visit due to the COVID 19.I stated to pt and husband we are not doing in office visit due to COVID 19. PT verbalized understanding. The pts husband he had a smart phone and a email he checks every other day. I explain he will need to download web ex on his cell phone. THe pt stated he is sketchy about downloading things on his phone. I explain to the husband the appt is protected appt that most employers use. He than stated he was receiving a incoming call and had to take it. I will call pt and husband back.

## 2018-05-23 NOTE — Telephone Encounter (Signed)
I called the husband back about doing video visit with pt and Janett Billow NP.I explain download webex on his cell phone.The husband was given directions on how to download. He was sketchy about it saying how secure is the appt. I explain the is secure and its what [employer] recommend. HE stated the visit is not urgent and wanted to r/s when the office is doing face to face visit. I stated if I r/s for May and June we maybe still doing video visit due to [place] 19 based on our leadership team. The husband verbalized understanding and r/s for Sep 2020.

## 2018-05-29 ENCOUNTER — Ambulatory Visit: Payer: Medicare HMO | Admitting: Adult Health

## 2018-06-05 DIAGNOSIS — Z794 Long term (current) use of insulin: Secondary | ICD-10-CM | POA: Diagnosis not present

## 2018-06-05 DIAGNOSIS — E118 Type 2 diabetes mellitus with unspecified complications: Secondary | ICD-10-CM | POA: Diagnosis not present

## 2018-06-21 ENCOUNTER — Other Ambulatory Visit: Payer: Self-pay

## 2018-06-21 ENCOUNTER — Ambulatory Visit (INDEPENDENT_AMBULATORY_CARE_PROVIDER_SITE_OTHER): Payer: Medicare HMO | Admitting: *Deleted

## 2018-06-21 DIAGNOSIS — I495 Sick sinus syndrome: Secondary | ICD-10-CM | POA: Diagnosis not present

## 2018-06-22 DIAGNOSIS — I509 Heart failure, unspecified: Secondary | ICD-10-CM | POA: Diagnosis not present

## 2018-06-22 DIAGNOSIS — I10 Essential (primary) hypertension: Secondary | ICD-10-CM | POA: Diagnosis not present

## 2018-06-22 DIAGNOSIS — G4733 Obstructive sleep apnea (adult) (pediatric): Secondary | ICD-10-CM | POA: Diagnosis not present

## 2018-06-23 LAB — CUP PACEART REMOTE DEVICE CHECK
Battery Remaining Longevity: 126 mo
Battery Remaining Percentage: 100 %
Brady Statistic RA Percent Paced: 80 %
Brady Statistic RV Percent Paced: 60 %
Date Time Interrogation Session: 20200507072600
Implantable Lead Implant Date: 20170214
Implantable Lead Implant Date: 20170214
Implantable Lead Location: 753859
Implantable Lead Location: 753860
Implantable Lead Model: 7740
Implantable Lead Model: 7741
Implantable Lead Serial Number: 649859
Implantable Lead Serial Number: 728741
Implantable Pulse Generator Implant Date: 20170214
Lead Channel Impedance Value: 670 Ohm
Lead Channel Impedance Value: 736 Ohm
Lead Channel Pacing Threshold Amplitude: 0.5 V
Lead Channel Pacing Threshold Amplitude: 1.4 V
Lead Channel Pacing Threshold Pulse Width: 0.4 ms
Lead Channel Pacing Threshold Pulse Width: 0.4 ms
Lead Channel Setting Pacing Amplitude: 1.7 V
Lead Channel Setting Pacing Amplitude: 2 V
Lead Channel Setting Pacing Pulse Width: 0.4 ms
Lead Channel Setting Sensing Sensitivity: 2.5 mV
Pulse Gen Serial Number: 718098

## 2018-06-28 ENCOUNTER — Encounter: Payer: Self-pay | Admitting: Cardiology

## 2018-06-28 NOTE — Progress Notes (Signed)
Remote pacemaker transmission.   

## 2018-07-03 DIAGNOSIS — D631 Anemia in chronic kidney disease: Secondary | ICD-10-CM | POA: Diagnosis not present

## 2018-07-03 DIAGNOSIS — E785 Hyperlipidemia, unspecified: Secondary | ICD-10-CM | POA: Diagnosis not present

## 2018-07-03 DIAGNOSIS — N189 Chronic kidney disease, unspecified: Secondary | ICD-10-CM | POA: Diagnosis not present

## 2018-07-03 DIAGNOSIS — E1122 Type 2 diabetes mellitus with diabetic chronic kidney disease: Secondary | ICD-10-CM | POA: Diagnosis not present

## 2018-07-03 DIAGNOSIS — G4733 Obstructive sleep apnea (adult) (pediatric): Secondary | ICD-10-CM | POA: Diagnosis not present

## 2018-07-03 DIAGNOSIS — I129 Hypertensive chronic kidney disease with stage 1 through stage 4 chronic kidney disease, or unspecified chronic kidney disease: Secondary | ICD-10-CM | POA: Diagnosis not present

## 2018-07-03 DIAGNOSIS — Z950 Presence of cardiac pacemaker: Secondary | ICD-10-CM | POA: Diagnosis not present

## 2018-07-03 DIAGNOSIS — I2510 Atherosclerotic heart disease of native coronary artery without angina pectoris: Secondary | ICD-10-CM | POA: Diagnosis not present

## 2018-07-03 DIAGNOSIS — E039 Hypothyroidism, unspecified: Secondary | ICD-10-CM | POA: Diagnosis not present

## 2018-07-03 DIAGNOSIS — N184 Chronic kidney disease, stage 4 (severe): Secondary | ICD-10-CM | POA: Diagnosis not present

## 2018-07-04 DIAGNOSIS — N390 Urinary tract infection, site not specified: Secondary | ICD-10-CM | POA: Diagnosis not present

## 2018-07-12 DIAGNOSIS — E1140 Type 2 diabetes mellitus with diabetic neuropathy, unspecified: Secondary | ICD-10-CM | POA: Diagnosis not present

## 2018-07-12 DIAGNOSIS — N183 Chronic kidney disease, stage 3 (moderate): Secondary | ICD-10-CM | POA: Diagnosis not present

## 2018-07-12 DIAGNOSIS — Z950 Presence of cardiac pacemaker: Secondary | ICD-10-CM | POA: Diagnosis not present

## 2018-07-12 DIAGNOSIS — I129 Hypertensive chronic kidney disease with stage 1 through stage 4 chronic kidney disease, or unspecified chronic kidney disease: Secondary | ICD-10-CM | POA: Diagnosis not present

## 2018-07-12 DIAGNOSIS — Z1331 Encounter for screening for depression: Secondary | ICD-10-CM | POA: Diagnosis not present

## 2018-07-12 DIAGNOSIS — Z9981 Dependence on supplemental oxygen: Secondary | ICD-10-CM | POA: Diagnosis not present

## 2018-07-12 DIAGNOSIS — R05 Cough: Secondary | ICD-10-CM | POA: Diagnosis not present

## 2018-07-13 DIAGNOSIS — Z20828 Contact with and (suspected) exposure to other viral communicable diseases: Secondary | ICD-10-CM | POA: Diagnosis not present

## 2018-07-23 DIAGNOSIS — I509 Heart failure, unspecified: Secondary | ICD-10-CM | POA: Diagnosis not present

## 2018-07-23 DIAGNOSIS — G4733 Obstructive sleep apnea (adult) (pediatric): Secondary | ICD-10-CM | POA: Diagnosis not present

## 2018-07-23 DIAGNOSIS — I10 Essential (primary) hypertension: Secondary | ICD-10-CM | POA: Diagnosis not present

## 2018-07-25 ENCOUNTER — Ambulatory Visit (INDEPENDENT_AMBULATORY_CARE_PROVIDER_SITE_OTHER): Payer: Medicare HMO

## 2018-07-25 ENCOUNTER — Ambulatory Visit (INDEPENDENT_AMBULATORY_CARE_PROVIDER_SITE_OTHER): Payer: Medicare HMO | Admitting: Internal Medicine

## 2018-07-25 ENCOUNTER — Encounter: Payer: Self-pay | Admitting: Internal Medicine

## 2018-07-25 DIAGNOSIS — J45991 Cough variant asthma: Secondary | ICD-10-CM

## 2018-07-25 DIAGNOSIS — J9611 Chronic respiratory failure with hypoxia: Secondary | ICD-10-CM | POA: Diagnosis not present

## 2018-07-25 DIAGNOSIS — R05 Cough: Secondary | ICD-10-CM | POA: Diagnosis not present

## 2018-07-25 MED ORDER — FAMOTIDINE 20 MG PO TABS: ORAL_TABLET | ORAL | 11 refills | Status: DC

## 2018-07-25 MED ORDER — ESOMEPRAZOLE MAGNESIUM 40 MG PO CPDR: DELAYED_RELEASE_CAPSULE | ORAL | 11 refills | Status: DC

## 2018-07-25 MED ORDER — MOMETASONE FURO-FORMOTEROL FUM 100-5 MCG/ACT IN AERO: 2.0000 | INHALATION_SPRAY | Freq: Two times a day (BID) | RESPIRATORY_TRACT | 0 refills | Status: DC

## 2018-07-25 MED ORDER — MOMETASONE FURO-FORMOTEROL FUM 100-5 MCG/ACT IN AERO: 2.0000 | INHALATION_SPRAY | Freq: Two times a day (BID) | RESPIRATORY_TRACT | 11 refills | Status: DC

## 2018-07-25 NOTE — Patient Instructions (Signed)
Plan A = Automatic = dulera 100 Take 2 puffs first thing in am and then another 2 puffs about 12 hours later.  - do not refill if not helping  Work on inhaler technique:  relax and gently blow all the way out then take a nice smooth deep breath back in, triggering the inhaler at same time you start breathing in.  Hold for up to 5 seconds if you can. Blow out [employer]  thru nose. Rinse and gargle with water when done    Plan B = Backup Only use your albuterol inhaler as a rescue medication to be used if you can't catch your breath by resting or doing a relaxed purse lip breathing pattern.  - The less you use it, the better it will work when you need it. - Ok to use the inhaler up to 2 puffs  every 4 hours if you must but call for appointment if use goes up over your usual need - Don't leave home without it !!  (think of it like the spare tire for your car)    Nexium  40 mg   Take  30-60 min before first meal of the day and Pepcid (famotidine)  20 mg one after supper  until return to office - this is the best way to tell whether stomach acid is contributing to your problem.     GERD (REFLUX)  is an extremely common cause of respiratory symptoms just like yours , many times with no obvious heartburn at all.    It can be treated with medication, but also with lifestyle changes including elevation of the head of your bed (ideally with 6 -8inch blocks under the headboard of your bed),  Smoking cessation, avoidance of late meals, excessive alcohol, and avoid fatty foods, chocolate, peppermint, colas, red wine, and acidic juices such as orange juice.  NO MINT OR MENTHOL PRODUCTS SO NO COUGH DROPS  USE SUGARLESS CANDY INSTEAD (Jolley ranchers or Stover's or Life Savers) or even ice chips will also do - the key is to swallow to prevent all throat clearing. NO OIL BASED VITAMINS - use powdered substitutes.  Avoid fish oil when coughing.    Please remember to go to the lab and x-ray department   for your  tests - we will call you with the results when they are available.     Please schedule a follow up office visit in 4 weeks, sooner if needed

## 2018-07-25 NOTE — Progress Notes (Signed)
Tara Jackson, female    DOB: 1940-08-27,     MRN: [id number]   Brief patient profile:  48 yobf RD with h/o dvt/pe p developing knee problems but Breathing never got back 100% and worse since around 2014 and requiring 02 hs assoc  freq cough/ sense of pnds Nathaniel Man x spring /summer of 2019 so referred to pulmonary clinic 07/25/2018 by Dr  Timothy Lasso      History of Present Illness  07/25/2018  Pulmonary/ 1st office eval/Chris Cripps  Chief Complaint  Patient presents with  . Consult    DME-[employer] on O2@2L  at night. has been having alot of "colds"coug, sinus drainage since 06-11-2018 on average using rescue inh. 2 time a day 4 days a week.  Dyspnea:  Better p albuterol / better p prednisone  Cough: since may 4th worse than usual  assoc with pnds /clear  Mucus/ not noct but severe to  point of gag/vomit Sleep: on side bed is flat/ 2 pillows variably cough/sob  SABA use: some better albuterol  Already tried 3 abx/ tessalon no better per office notes  Note flare off gerd rx which prev took per Dr Ewing Schlein    No obvious day to day or daytime variability or assoc  purulent sputum or mucus plugs or hemoptysis or cp or chest tightness, subjective wheeze or overt sinus or hb symptoms.    Also denies any obvious fluctuation of symptoms with weather or environmental changes or other aggravating or alleviating factors except as outlined above   No unusual exposure hx or h/o childhood pna/ asthma or knowledge of premature birth.  Current Allergies, Complete Past Medical History, Past Surgical History, Family History, and Social History were reviewed in [employer] record.  ROS  The following are not active complaints unless bolded Hoarseness, sore throat, dysphagia, dental problems, itching, sneezing,  nasal congestion or discharge of excess mucus or purulent secretions, ear ache,   fever, chills, sweats, unintended wt loss or wt gain, classically pleuritic or exertional cp,  orthopnea  pnd or arm/hand swelling  or leg swelling, presyncope, palpitations, abdominal pain, anorexia, nausea, vomiting, diarrhea  or change in bowel habits or change in bladder habits, change in stools or change in urine, dysuria, hematuria,  rash, arthralgias, visual complaints, headache, numbness, weakness or ataxia or problems with walking or coordination,  change in mood or  memory.           Past Medical History:  Diagnosis Date  . Anemia   . Anxiety   . Arthritis   . Asthma   . Brittle bone disease   . Cataract   . CKD (chronic kidney disease)   . Complication of anesthesia    hard to wake up after anesthesia, trouble turning head  . Coronary artery disease, non-occlusive 2014   a. minimal CAD by cath in 2014 with 40% RI stenosis. b. low-risk NST in 11/2014.  Marland Kitchen Depression   . Diabetes mellitus type 2, insulin dependent (HCC)   . Diabetic neuropathy (HCC)   . Diastolic dysfunction, left ventricle 11/30/14   EF 55%, grade 1 DD  . DVT (deep venous thrombosis) (HCC)    "18 in RLE; 7 LLE prior to PE" (03/24/2015)  . Family history of adverse reaction to anesthesia    " the whole family is hard to wake up"  . Glaucoma   . H/O hiatal hernia   . Hyperlipidemia   . Hypertension associated with diabetes (HCC)   . Hypothyroidism   .  Kidney stones    "passed them"  . Memory loss 06/03/2014  . On home oxygen therapy    "2L oxygen concentrator @ night"  . Osteoporosis   . Oxygen desaturation during sleep    wears 2 liters of oxygen at night   . Palpitations 35years ago   Cardionet monitor - revealed mostly normal sinus rhythm, sinus bradycardia with first-degree A-V block heart rates mostly in the 50s and 60s with some 70s. No arrhythmias, PVCs or PACs noted.  . PE (pulmonary thromboembolism) (HCC) x 3, last one was ~ 2003   history of recurrent RLE DVT with PE - last PE ~>13 yrs; Maintained on Plavix  . Pneumonia   . Presence of permanent cardiac pacemaker   . Restless legs syndrome   .  Sleep apnea    cpap disontinued; test done 07/14/2008 ordered per  Byrum  . Spinal headache   . Spinal stenosis    L 3, L 4 and L 5, and C 1, C 2 and C3  . Stroke [facility]) 04-19-2013   tia and stroke. Weakness Rt hand  . 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 s/p [employer] PPM in 03/2015 by Dr. Ladona Ridgel secondary to sinus node dysfunction.   Marland Kitchen TIA (transient ischemic attack) March 2014    Outpatient Medications Prior to Visit  Medication Sig Dispense Refill  . acetaminophen (TYLENOL) 500 MG tablet Take 2 tablets (1,000 mg total) by mouth every 6 (six) hours as needed. 30 tablet 0  . albuterol (PROVENTIL HFA;VENTOLIN HFA) 108 (90 BASE) MCG/ACT inhaler Inhale 2 puffs into the lungs every 6 (six) hours as needed for wheezing.    . bisacodyl (FLEET) 10 MG/30ML ENEM Place 10 mg rectally once.    . busPIRone (BUSPAR) 10 MG tablet Take 1 tablet (10 mg total) by mouth every morning. 60 tablet 1  . Cholecalciferol (VITAMIN D-3 PO) Take 2 tablets by mouth every morning.     . clonazePAM (KLONOPIN) 0.5 MG tablet Take 0.5-1 mg by mouth See admin instructions. Takes 2 tabs in am and 1 tab in pm.    . clopidogrel (PLAVIX) 75 MG tablet Take 75 mg by mouth every morning.    . dicyclomine (BENTYL) 10 MG capsule Take 1 capsule (10 mg total) by mouth 3 (three) times daily before meals.    Marland Kitchen DM-APAP-CPM (VICKS FORMULA 44 COUGH/COLD PM PO) Take by mouth. Used 1/2 cup    . donepezil (ARICEPT) 10 MG tablet Take 1 tablet (10 mg total) by mouth at bedtime. 90 tablet 1  . glipiZIDE (GLUCOTROL) 10 MG tablet Take 20 mg by mouth 2 (two) times daily before a meal.     . guaiFENesin (MUCINEX) 600 MG 12 hr tablet Take 600 mg by mouth 2 (two) times daily as needed for cough.    . insulin NPH (HUMULIN N,NOVOLIN N) 100 UNIT/ML injection Inject 0-20 Units into the skin See admin instructions. Novolin-N Give 20 unit in morning; give  5 units at 4pm; evening dose as needed.  If less than 150 blood sugar= 0units.  If greater  than 200 blood sugar= give 10units to decrease by 100 blood sugar    . insulin regular (NOVOLIN R,HUMULIN R) 100 units/mL injection Inject 0-25 Units into the skin See admin instructions. Give 25 unit in morning; give 20units at 4pm; evening dose as needed.  If less than 150 blood sugar= 0units.  If greater than 200 blood sugar= give 10units to decrease by 100 blood sugar    .  ipratropium (ATROVENT) 0.03 % nasal spray Place 2 sprays into both nostrils every 12 (twelve) hours. (Patient taking differently: Place 2 sprays into both nostrils every 12 (twelve) hours as needed for rhinitis. ) 30 mL 0  . levothyroxine (SYNTHROID, LEVOTHROID) 100 MCG tablet Take 88 mcg by mouth every morning.     . lidocaine (LIDODERM) 5 % Place 3 patches onto the skin daily as needed (pain). Remove & Discard patch within 12 hours or as directed by MD    . linaclotide (LINZESS) 290 MCG CAPS capsule Take 290 mcg by mouth daily before breakfast.    . LORazepam (ATIVAN) 0.5 MG tablet Take 0.5 mg by mouth daily as needed for anxiety.    . magnesium 30 MG tablet Take 30 mg by mouth daily.    . midodrine (PROAMATINE) 2.5 MG tablet Take 1 tablet (2.5 mg total) by mouth 3 (three) times daily with meals. 90 tablet 6  . nortriptyline (PAMELOR) 50 MG capsule TAKE 2 CAPSULES (100MG ) AT BEDTIME (Patient taking differently: TAKE 1 CAPSULES (100MG ) AT two times daily) 180 capsule 1  . ondansetron (ZOFRAN) 4 MG tablet Take 4 mg by mouth as needed.  0  . OXYGEN Inhale 2 L into the lungs as needed. Used at overnight with a concentrator     . polyethylene glycol (MIRALAX / GLYCOLAX) packet Take 17 g by mouth daily as needed for mild constipation.     . potassium chloride SA (K-DUR,KLOR-CON) 20 MEQ tablet Take 1 tablet (20 mEq total) by mouth 2 (two) times daily. 10 tablet 0  . rosuvastatin (CRESTOR) 20 MG tablet Take 20 mg by mouth daily.     . solifenacin (VESICARE) 10 MG tablet Take 10 mg by mouth daily as needed (overactive bladder).     Marland Kitchen  terconazole (TERAZOL 7) 0.4 % vaginal cream Place 1 applicator vaginally at bedtime as needed (for yeast infection).     . traMADol (ULTRAM) 50 MG tablet Take 1 tablet (50 mg total) by mouth every 12 (twelve) hours as needed for moderate pain. (Patient taking differently: Take 50 mg by mouth 2 (two) times daily as needed for moderate pain. ) 30 tablet 0  . furosemide (LASIX) 20 MG tablet Take 1 tablet (20 mg total) by mouth daily as needed for fluid or edema. 90 tablet 3  . vitamin B-12 (CYANOCOBALAMIN) 500 MCG tablet Take 1 tablet (500 mcg total) by mouth daily. (Patient not taking: Reported on 07/25/2018) 30 tablet 0      Objective:     BP 140/78 (BP Location: Right Arm, Cuff Size: Large)   Pulse 60   Temp 98.2 F (36.8 C) (Oral)   Ht 5' 7.5" (1.715 m)   Wt 258 lb 3.2 oz (117.1 kg)   SpO2 98%   BMI 39.84 kg/m   SpO2: 98 % RA  hoarse w/c bound pleasant bf nad   HEENT: nl dentition, turbinates bilaterally, and oropharynx. Nl external ear canals without cough reflex   NECK :  without JVD/Nodes/TM/ nl carotid upstrokes bilaterally   LUNGS: no acc muscle use,    slt kyhotic contour chest which is clear to A and P bilaterally without cough on insp or exp maneuvers   CV:  RRR  no s3 or murmur or increase in P2, and 1+ pitting both LEs   ABD:  Quite obese soft and nontender with nl inspiratory excursion in the supine position. No bruits or organomegaly appreciated, bowel sounds nl  MS:    ext  warm without deformities, calf tenderness, cyanosis or clubbing    SKIN: warm and dry without lesions    NEURO:  alert, approp, nl sensorium with  no motor or cerebellar deficits apparent.    CXR PA and Lateral:   07/25/2018 :    I personally reviewed images /  impression as follows:   No acute findings/ mild kyphosis   Labs ordered 07/25/2018  Allergy profile         Assessment   Cough variant asthma vs uacs  Onset spring 2019  - Allergy profile 07/25/2018 >  Eos 0. /  IgE   -  07/25/2018  After extensive coaching inhaler device,  effectiveness =  50% at best from a baseline of 0 > try dulera 100 2bid x 2 weeks only refill if helping    DDX of  difficult airways management almost all start with A and  include Adherence, Ace Inhibitors, Acid Reflux, Active Sinus Disease, Alpha 1 Antitripsin deficiency, Anxiety masquerading as Airways dz,  ABPA,  Allergy(esp in young), Aspiration (esp in elderly), Adverse effects of meds,  Active smoking or vaping, A bunch of PE's (a small clot burden can't cause this syndrome unless there is already severe underlying pulm or vascular dz with poor reserve) plus two Bs  = Bronchiectasis and Beta blocker use..and one C= CHF   Adherence is always the initial "prime suspect" and is a multilayered concern that requires a "trust but verify" approach in every patient - starting with knowing how to use medications, especially inhalers, correctly, keeping up with refills and understanding the fundamental difference between maintenance and prns vs those medications only taken for a very short course and then stopped and not refilled.  - see hfa teaching - return with all meds in hand using a trust but verify approach to confirm accurate Medication  Reconciliation The principal here is that until we are certain that the  patients are doing what we've asked, it makes no sense to ask them to do more.    ? Acid (or non-acid) GERD > always difficult to exclude as up to 75% of pts in some series report no assoc GI/ Heartburn symptoms> rec max (24h)  acid suppression and diet restrictions/ reviewed and instructions given in writing.  - Of the three most common causes of  Sub-acute / recurrent or chronic cough, only one (GERD)  can actually contribute to/ trigger  the other two (asthma and post nasal drip syndrome)  and perpetuate the cylce of cough.  While not intuitively obvious, many patients with chronic low grade reflux do not cough until there is a primary  insult that disturbs the protective epithelial barrier and exposes sensitive nerve endings.   This is typically viral but can due to PNDS and  either may apply here.     >>>The point is that once this occurs, it is difficult to eliminate the cycle  using anything but a maximally effective acid suppression regimen at least in the short run, accompanied by an appropriate diet to address non acid GERD     ? Allergy/asthma > prednisone response noted but she's not able to inhale at all at baseline so hard to believe the saba has been helping and only need low dose ics here as higher doses may aggravate uacs so if not better on dulera 100 2 bid samples should d/c  ? Active sinus dz > last sinus CT neg as part of head ct 12/21/2017   ? A bunch  of PE's > note h/o dvt/ pe but nothing to suggest here  ? Anxiety/depression > usually at the bottom of this list of usual suspects but should be included  on this pt's since already on psychotropics and this problem may interfere with adherence and also interpretation of response or lack thereof to symptom management which can be quite subjective.   ? chf > cxr does not support    >>> f/u in 6 weeks sooner if needed     Chronic respiratory failure with hypoxia (HCC) sats are fine at rest r/a  rec continue 02 2lpm hs for now      Morbid obesity (HCC) Body mass index is 39.84 kg/m.    Lab Results  Component Value Date   TSH 4.194 09/12/2017    Contributing to gerd risk/ doe/reviewed the need and the process to achieve and maintain neg calorie balance > defer f/u primary care including intermittently monitoring thyroid status       Total time devoted to counseling  > 50 % of initial 60 min office visit:  review case with pt/spouse/  device teaching which extended face to face time for this visit  discussion of options/alternatives/ personally creating written customized instructions  in presence of pt  then going over those specific   Instructions directly with the pt including how to use all of the meds but in particular covering each new medication in detail and the difference between the maintenance= "automatic" meds and the prns using an action plan format for the latter (If this problem/symptom => do that organization reading Left to right).  Please see AVS from this visit for a full list of these instructions which I personally wrote for this pt and  are unique to this visit.      Sandrea Hughs, MD 07/25/2018

## 2018-07-26 ENCOUNTER — Encounter: Payer: Self-pay | Admitting: Internal Medicine

## 2018-07-26 ENCOUNTER — Telehealth: Payer: Self-pay | Admitting: Internal Medicine

## 2018-07-26 DIAGNOSIS — J9611 Chronic respiratory failure with hypoxia: Secondary | ICD-10-CM | POA: Insufficient documentation

## 2018-07-26 LAB — RESPIRATORY ALLERGY PROFILE REGION II ~~LOC~~

## 2018-07-26 LAB — CBC WITH DIFFERENTIAL/PLATELET
Basophils Absolute: 0.1 10*3/uL (ref 0.0–0.1)
Basophils Relative: 1.2 % (ref 0.0–3.0)
Eosinophils Absolute: 0.1 10*3/uL (ref 0.0–0.7)
Eosinophils Relative: 2.2 % (ref 0.0–5.0)
HCT: 36.3 % (ref 36.0–46.0)
Hemoglobin: 11.7 g/dL — ABNORMAL LOW (ref 12.0–15.0)
Lymphocytes Relative: 30.4 % (ref 12.0–46.0)
Lymphs Abs: 1.7 10*3/uL (ref 0.7–4.0)
MCHC: 32.4 g/dL (ref 30.0–36.0)
MCV: 97.8 fl (ref 78.0–100.0)
Monocytes Absolute: 0.4 10*3/uL (ref 0.1–1.0)
Monocytes Relative: 6.4 % (ref 3.0–12.0)
Neutro Abs: 3.4 10*3/uL (ref 1.4–7.7)
Neutrophils Relative %: 59.8 % (ref 43.0–77.0)
Platelets: 245 10*3/uL (ref 150.0–400.0)
RBC: 3.71 Mil/uL — ABNORMAL LOW (ref 3.87–5.11)
RDW: 13.4 % (ref 11.5–15.5)
WBC: 5.7 10*3/uL (ref 4.0–10.5)

## 2018-07-26 LAB — INTERPRETATION:

## 2018-07-26 NOTE — Telephone Encounter (Signed)
Advised pt of results. Pt understood and nothing further is needed.   

## 2018-07-26 NOTE — Progress Notes (Signed)
Spoke with pt and notified of results per Dr. Wert. Pt verbalized understanding and denied any questions. 

## 2018-07-26 NOTE — Assessment & Plan Note (Signed)
Body mass index is 39.84 kg/m.    Lab Results  Component Value Date   TSH 4.194 09/12/2017     Contributing to gerd risk/ doe/reviewed the need and the process to achieve and maintain neg calorie balance > defer f/u primary care including intermittently monitoring thyroid status

## 2018-07-26 NOTE — Assessment & Plan Note (Signed)
sats are fine at rest r/a  rec continue 02 2lpm hs for now     Total time devoted to counseling  > 50 % of initial 60 min office visit:  review case with pt/spouse/  device teaching which extended face to face time for this visit  discussion of options/alternatives/ personally creating written customized instructions  in presence of pt  then going over those specific  Instructions directly with the pt including how to use all of the meds but in particular covering each new medication in detail and the difference between the maintenance= "automatic" meds and the prns using an action plan format for the latter (If this problem/symptom => do that organization reading Left to right).  Please see AVS from this visit for a full list of these instructions which I personally wrote for this pt and  are unique to this visit.

## 2018-07-26 NOTE — Progress Notes (Signed)
LMTCB

## 2018-07-26 NOTE — Assessment & Plan Note (Addendum)
Onset spring 2019  - Allergy profile 07/25/2018 >  Eos 0. /  IgE   - 07/25/2018  After extensive coaching inhaler device,  effectiveness =  50% at best from a baseline of 0 > try dulera 100 2bid x 2 weeks only refill if helping    DDX of  difficult airways management almost all start with A and  include Adherence, Ace Inhibitors, Acid Reflux, Active Sinus Disease, Alpha 1 Antitripsin deficiency, Anxiety masquerading as Airways dz,  ABPA,  Allergy(esp in young), Aspiration (esp in elderly), Adverse effects of meds,  Active smoking or vaping, A bunch of PE's (a small clot burden can't cause this syndrome unless there is already severe underlying pulm or vascular dz with poor reserve) plus two Bs  = Bronchiectasis and Beta blocker use..and one C= CHF   Adherence is always the initial "prime suspect" and is a multilayered concern that requires a "trust but verify" approach in every patient - starting with knowing how to use medications, especially inhalers, correctly, keeping up with refills and understanding the fundamental difference between maintenance and prns vs those medications only taken for a very short course and then stopped and not refilled.  - see hfa teaching - return with all meds in hand using a trust but verify approach to confirm accurate Medication  Reconciliation The principal here is that until we are certain that the  patients are doing what we've asked, it makes no sense to ask them to do more.    ? Acid (or non-acid) GERD > always difficult to exclude as up to 75% of pts in some series report no assoc GI/ Heartburn symptoms> rec max (24h)  acid suppression and diet restrictions/ reviewed and instructions given in writing.  - Of the three most common causes of  Sub-acute / recurrent or chronic cough, only one (GERD)  can actually contribute to/ trigger  the other two (asthma and post nasal drip syndrome)  and perpetuate the cylce of cough.  While not intuitively obvious, many  patients with chronic low grade reflux do not cough until there is a primary insult that disturbs the protective epithelial barrier and exposes sensitive nerve endings.   This is typically viral but can due to PNDS and  either may apply here.     >>>The point is that once this occurs, it is difficult to eliminate the cycle  using anything but a maximally effective acid suppression regimen at least in the short run, accompanied by an appropriate diet to address non acid GERD     ? Allergy/asthma > prednisone response noted but she's not able to inhale at all at baseline so hard to believe the saba has been helping and only need low dose ics here as higher doses may aggravate uacs so if not better on dulera 100 2 bid samples should d/c  ? Active sinus dz > last sinus CT neg as part of head ct 12/21/2017   ? A bunch of PE's > note h/o dvt/ pe but nothing to suggest here  ? Anxiety/depression > usually at the bottom of this list of usual suspects but should be included  on this pt's since already on psychotropics and this problem may interfere with adherence and also interpretation of response or lack thereof to symptom management which can be quite subjective.   ? chf > cxr does not support    >>> f/u in 6 weeks sooner if needed

## 2018-08-22 ENCOUNTER — Telehealth: Payer: Self-pay | Admitting: Internal Medicine

## 2018-08-22 DIAGNOSIS — I509 Heart failure, unspecified: Secondary | ICD-10-CM | POA: Diagnosis not present

## 2018-08-22 DIAGNOSIS — I10 Essential (primary) hypertension: Secondary | ICD-10-CM | POA: Diagnosis not present

## 2018-08-22 DIAGNOSIS — G4733 Obstructive sleep apnea (adult) (pediatric): Secondary | ICD-10-CM | POA: Diagnosis not present

## 2018-08-22 DIAGNOSIS — R509 Fever, unspecified: Secondary | ICD-10-CM

## 2018-08-22 NOTE — Telephone Encounter (Signed)
Would get the covid testing before ov and if condition deteriorates while awaiting result then go to ER

## 2018-08-22 NOTE — Telephone Encounter (Signed)
Spoke with pts spouse. They were both tested last Mon and are awaiting results of Covid test. Made aware if results do not come back by this afternoon we would need to reschedule ov. He verbalized understanding

## 2018-08-22 NOTE — Telephone Encounter (Signed)
Returned call to patient. Explained MW would like appt rescheduled after results of COVID test come back. Appt for tomorrow has been cancelled. Pt will call back when she gets results from outside source. Nothing further needed.

## 2018-08-22 NOTE — Telephone Encounter (Signed)
Pt is calling back stated that she had covid test last week and still has not received any results would like to come to office tomorrow has appointment I took tele visit off of her appointment notes could someone call or look into this if it needs to be changed back to televisit please let pt know

## 2018-08-22 NOTE — Telephone Encounter (Signed)
Pt has appt tomorrow. Pt thinks she may have had a fever. She did not have a thermometer. Pt has a head cold last ov. Pt had a fall a few days ago. She is achy. Pt c/o cough with thick mucus. She ran out of [facility] sample 3 days ago and only using Albuterol inhaler bid. She wants to know if she should still come in tomorrow?   07/25/18 Instructions  Plan A = Automatic = dulera 100 Take 2 puffs first thing in am and then another 2 puffs about 12 hours later.  - do not refill if not helping  Work on inhaler technique:  relax and gently blow all the way out then take a nice smooth deep breath back in, triggering the inhaler at same time you start breathing in.  Hold for up to 5 seconds if you can. Blow out [employer]  thru nose. Rinse and gargle with water when done    Plan B = Backup Only use your albuterol inhaler as a rescue medication to be used if you can't catch your breath by resting or doing a relaxed purse lip breathing pattern.  - The less you use it, the better it will work when you need it. - Ok to use the inhaler up to 2 puffs  every 4 hours if you must but call for appointment if use goes up over your usual need - Don't leave home without it !!  (think of it like the spare tire for your car)    Nexium  40 mg   Take  30-60 min before first meal of the day and Pepcid (famotidine)  20 mg one after supper  until return to office - this is the best way to tell whether stomach acid is contributing to your problem.     GERD (REFLUX)  is an extremely common cause of respiratory symptoms just like yours , many times with no obvious heartburn at all.    It can be treated with medication, but also with lifestyle changes including elevation of the head of your bed (ideally with 6 -8inch blocks under the headboard of your bed),  Smoking cessation, avoidance of late meals, excessive alcohol, and avoid fatty foods, chocolate, peppermint, colas, red wine, and acidic juices such as orange  juice.  NO MINT OR MENTHOL PRODUCTS SO NO COUGH DROPS  USE SUGARLESS CANDY INSTEAD (Jolley ranchers or Stover's or Life Savers) or even ice chips will also do - the key is to swallow to prevent all throat clearing. NO OIL BASED VITAMINS - use powdered substitutes.  Avoid fish oil when coughing.    Please remember to go to the lab and x-ray department   for your tests - we will call you with the results when they are available.     Please schedule a follow up office visit in 4 weeks, sooner if needed

## 2018-08-23 ENCOUNTER — Ambulatory Visit: Payer: Medicare HMO | Admitting: Internal Medicine

## 2018-09-04 DIAGNOSIS — Z794 Long term (current) use of insulin: Secondary | ICD-10-CM | POA: Diagnosis not present

## 2018-09-04 DIAGNOSIS — E118 Type 2 diabetes mellitus with unspecified complications: Secondary | ICD-10-CM | POA: Diagnosis not present

## 2018-09-11 ENCOUNTER — Telehealth: Payer: Self-pay | Admitting: Internal Medicine

## 2018-09-11 ENCOUNTER — Other Ambulatory Visit: Payer: Self-pay | Admitting: Internal Medicine

## 2018-09-11 MED ORDER — MOMETASONE FURO-FORMOTEROL FUM 100-5 MCG/ACT IN AERO: 2.0000 | INHALATION_SPRAY | Freq: Two times a day (BID) | RESPIRATORY_TRACT | 11 refills | Status: DC

## 2018-09-11 NOTE — Telephone Encounter (Signed)
Refill of dulera has been sent to pt's preferred pharmacy. Called and spoke with pt letting her know this had been done. Asked pt if she had received the results of the covid test she had done and pt said that she has had two covid tests performed, the last one being 3 weeks ago. Both of the covid tests have come back negative.  Asked pt if I could schedule a f/u appt with MW and pt had nothing to be able to write or look at a calendar to get appt scheduled and said she would call back to get the appt scheduled.

## 2018-09-15 ENCOUNTER — Other Ambulatory Visit: Payer: Self-pay | Admitting: Internal Medicine

## 2018-09-17 DIAGNOSIS — I951 Orthostatic hypotension: Secondary | ICD-10-CM | POA: Diagnosis not present

## 2018-09-17 DIAGNOSIS — R05 Cough: Secondary | ICD-10-CM | POA: Diagnosis not present

## 2018-09-17 DIAGNOSIS — W19XXXD Unspecified fall, subsequent encounter: Secondary | ICD-10-CM | POA: Diagnosis not present

## 2018-09-17 DIAGNOSIS — H3530 Unspecified macular degeneration: Secondary | ICD-10-CM | POA: Diagnosis not present

## 2018-09-17 DIAGNOSIS — Z794 Long term (current) use of insulin: Secondary | ICD-10-CM | POA: Diagnosis not present

## 2018-09-17 DIAGNOSIS — E1140 Type 2 diabetes mellitus with diabetic neuropathy, unspecified: Secondary | ICD-10-CM | POA: Diagnosis not present

## 2018-09-17 DIAGNOSIS — Z9981 Dependence on supplemental oxygen: Secondary | ICD-10-CM | POA: Diagnosis not present

## 2018-09-17 DIAGNOSIS — Z20828 Contact with and (suspected) exposure to other viral communicable diseases: Secondary | ICD-10-CM | POA: Diagnosis not present

## 2018-09-17 DIAGNOSIS — Z950 Presence of cardiac pacemaker: Secondary | ICD-10-CM | POA: Diagnosis not present

## 2018-09-20 ENCOUNTER — Ambulatory Visit (INDEPENDENT_AMBULATORY_CARE_PROVIDER_SITE_OTHER): Payer: Medicare HMO | Admitting: *Deleted

## 2018-09-20 DIAGNOSIS — I495 Sick sinus syndrome: Secondary | ICD-10-CM

## 2018-09-21 LAB — CUP PACEART REMOTE DEVICE CHECK
Battery Remaining Longevity: 126 mo
Battery Remaining Percentage: 100 %
Brady Statistic RA Percent Paced: 83 %
Brady Statistic RV Percent Paced: 62 %
Date Time Interrogation Session: 20200813072800
Implantable Lead Implant Date: 20170214
Implantable Lead Implant Date: 20170214
Implantable Lead Location: 753859
Implantable Lead Location: 753860
Implantable Lead Model: 7740
Implantable Lead Model: 7741
Implantable Lead Serial Number: 649859
Implantable Lead Serial Number: 728741
Implantable Pulse Generator Implant Date: 20170214
Lead Channel Impedance Value: 668 Ohm
Lead Channel Impedance Value: 714 Ohm
Lead Channel Pacing Threshold Amplitude: 0.6 V
Lead Channel Pacing Threshold Amplitude: 1.3 V
Lead Channel Pacing Threshold Pulse Width: 0.4 ms
Lead Channel Pacing Threshold Pulse Width: 0.4 ms
Lead Channel Setting Pacing Amplitude: 1.7 V
Lead Channel Setting Pacing Amplitude: 2 V
Lead Channel Setting Pacing Pulse Width: 0.4 ms
Lead Channel Setting Sensing Sensitivity: 2.5 mV
Pulse Gen Serial Number: 718098

## 2018-09-22 DIAGNOSIS — G4733 Obstructive sleep apnea (adult) (pediatric): Secondary | ICD-10-CM | POA: Diagnosis not present

## 2018-09-22 DIAGNOSIS — I509 Heart failure, unspecified: Secondary | ICD-10-CM | POA: Diagnosis not present

## 2018-09-22 DIAGNOSIS — I10 Essential (primary) hypertension: Secondary | ICD-10-CM | POA: Diagnosis not present

## 2018-09-28 ENCOUNTER — Other Ambulatory Visit: Payer: Self-pay | Admitting: Neurology

## 2018-09-28 ENCOUNTER — Ambulatory Visit (INDEPENDENT_AMBULATORY_CARE_PROVIDER_SITE_OTHER): Payer: Medicare HMO | Admitting: Internal Medicine

## 2018-09-28 ENCOUNTER — Other Ambulatory Visit: Payer: Self-pay

## 2018-09-28 DIAGNOSIS — J45991 Cough variant asthma: Secondary | ICD-10-CM | POA: Diagnosis not present

## 2018-09-28 DIAGNOSIS — R7989 Other specified abnormal findings of blood chemistry: Secondary | ICD-10-CM | POA: Diagnosis not present

## 2018-09-28 DIAGNOSIS — D649 Anemia, unspecified: Secondary | ICD-10-CM | POA: Diagnosis not present

## 2018-09-28 DIAGNOSIS — Z23 Encounter for immunization: Secondary | ICD-10-CM | POA: Diagnosis not present

## 2018-09-28 MED ORDER — BUDESONIDE-FORMOTEROL FUMARATE 80-4.5 MCG/ACT IN AERO: INHALATION_SPRAY | RESPIRATORY_TRACT | 12 refills | Status: DC

## 2018-09-28 MED ORDER — PREDNISONE 10 MG PO TABS: ORAL_TABLET | ORAL | 0 refills | Status: DC

## 2018-09-28 NOTE — Progress Notes (Signed)
Tara Jackson, female    DOB: 01-21-1941,     MRN: [id number]   Brief patient profile:  20 yobf RD with h/o dvt/pe p developing knee problems but Breathing never got back 100% and worse since around 2014 and requiring 02 hs assoc  freq cough/ sense of pnds Nathaniel Man x spring /summer of 2019 so referred to pulmonary clinic 07/25/2018 by Dr  Timothy Lasso      History of Present Illness  07/25/2018  Pulmonary/ 1st office eval/Adalyn Pennock  Chief Complaint  Patient presents with  . Consult    DME-[employer] on O2@2L  at night. has been having alot of "colds"coug, sinus drainage since 06-11-2018 on average using rescue inh. 2 time a day 4 days a week.  Dyspnea:  Better p albuterol / better p prednisone  Cough: since may 4th worse than usual  assoc with pnds /clear  Mucus/ not noct but severe to  point of gag/vomit Sleep: on side bed is flat/ 2 pillows variably cough/sob  SABA use: some better albuterol  Already tried 3 abx/ tessalon no better per office notes  Note flare off gerd rx which prev took per Dr Ewing Schlein  rec Plan A = Automatic = dulera 100 Take 2 puffs first thing in am and then another 2 puffs about 12 hours later - do not refill if not helping Work on inhaler technique:  Plan B = Backup Only use your albuterol inhaler as a rescue medication  Nexium  40 mg   Take  30-60 min before first meal of the day and Pepcid (famotidine)  20 mg one after supper  until return to office - this is the best way to tell whether stomach acid is contributing to your problem.   GERD diet    Virtual Visit via Telephone Note 09/28/2018   I connected with Terah Giaimo on 09/28/18 at  4:15 PM EDT by telephone and verified that I am speaking with the correct person using two identifiers.   I discussed the limitations, risks, security and privacy concerns of performing an evaluation and management service by telephone and the availability of in person appointments. I also discussed with the patient that there may be a  patient responsible charge related to this service. The patient expressed understanding and agreed to proceed.   History of Present Illness: Dyspnea:  Much better while on dulera / less need albuterol / worse since ran out of sample and can't afford dulera on her plan Cough: more hoarse with sense of PNDS  but no real cough  Sleeping: bothered by nose running  SABA use: twice daily  02: 2lpm hs    No obvious day to day or daytime variability or assoc excess/ purulent sputum or mucus plugs or hemoptysis or cp or chest tightness, subjective wheeze or overt  hb symptoms.    Also denies any obvious fluctuation of symptoms with weather or environmental changes or other aggravating or alleviating factors except as outlined above.   Meds reviewed/ med reconciliation completed         Observations/Objective: Mildly hoarse, no coughing or apparent wob appreciated over the phone    Assessment and Plan: See problem list for active a/p's   Follow Up Instructions: See avs for instructions unique to this ov which includes revised/ updated med list     I discussed the assessment and treatment plan with the patient. The patient was provided an opportunity to ask questions and all were answered. The patient agreed with the  plan and demonstrated an understanding of the instructions.   The patient was advised to call back or seek an in-person evaluation if the symptoms worsen or if the condition fails to improve as anticipated.  I provided 22  minutes of non-face-to-face time during this encounter.   Sandrea Hughs, MD           Past Medical History:  Diagnosis Date  . Anemia   . Anxiety   . Arthritis   . Asthma   . Brittle bone disease   . Cataract   . CKD (chronic kidney disease)   . Complication of anesthesia    hard to wake up after anesthesia, trouble turning head  . Coronary artery disease, non-occlusive 2014   a. minimal CAD by cath in 2014 with 40% RI stenosis. b.  low-risk NST in 11/2014.  Marland Kitchen Depression   . Diabetes mellitus type 2, insulin dependent (HCC)   . Diabetic neuropathy (HCC)   . Diastolic dysfunction, left ventricle 11/30/14   EF 55%, grade 1 DD  . DVT (deep venous thrombosis) (HCC)    "18 in RLE; 7 LLE prior to PE" (03/24/2015)  . Family history of adverse reaction to anesthesia    " the whole family is hard to wake up"  . Glaucoma   . H/O hiatal hernia   . Hyperlipidemia   . Hypertension associated with diabetes (HCC)   . Hypothyroidism   . Kidney stones    "passed them"  . Memory loss 06/03/2014  . On home oxygen therapy    "2L oxygen concentrator @ night"  . Osteoporosis   . Oxygen desaturation during sleep    wears 2 liters of oxygen at night   . Palpitations 35years ago   Cardionet monitor - revealed mostly normal sinus rhythm, sinus bradycardia with first-degree A-V block heart rates mostly in the 50s and 60s with some 70s. No arrhythmias, PVCs or PACs noted.  . PE (pulmonary thromboembolism) (HCC) x 3, last one was ~ 2003   history of recurrent RLE DVT with PE - last PE ~>13 yrs; Maintained on Plavix  . Pneumonia   . Presence of permanent cardiac pacemaker   . Restless legs syndrome   . Sleep apnea    cpap disontinued; test done 07/14/2008 ordered per  Byrum  . Spinal headache   . Spinal stenosis    L 3, L 4 and L 5, and C 1, C 2 and C3  . Stroke [facility]) 04-19-2013   tia and stroke. Weakness Rt hand  . 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 s/p [employer] PPM in 03/2015 by Dr. Ladona Ridgel secondary to sinus node dysfunction.   Marland Kitchen TIA (transient ischemic attack) March 2014

## 2018-09-28 NOTE — Patient Instructions (Signed)
Plan A = Automatic =start symbicort 80 Take 2 puffs first thing in am and then another 2 puffs about 12 hours later.   And take Prednisone 10 mg take  4 each am x 2 days,   2 each am x 2 days,  1 each am x 2 days and stop    Work on inhaler technique:  relax and gently blow all the way out then take a nice smooth deep breath back in, triggering the inhaler at same time you start breathing in.  Hold for up to 5 seconds if you can. Blow out [employer]  thru nose. Rinse and gargle with water when done    Plan B = Backup Only use your albuterol inhaler as a rescue medication to be used if you can't catch your breath by resting or doing a relaxed purse lip breathing pattern.  - The less you use it, the better it will work when you need it. - Ok to use the inhaler up to 2 puffs  every 4 hours if you must but call for appointment if use goes up over your usual need - Don't leave home without it !!  (think of it like the spare tire for your car)    Please schedule a follow up office visit in 4 weeks, sooner if needed  with all medications /inhalers/ solutions in hand so we can verify exactly what you are taking. This includes all medications from all doctors and over the counters

## 2018-09-29 ENCOUNTER — Encounter: Payer: Self-pay | Admitting: Internal Medicine

## 2018-09-29 NOTE — Assessment & Plan Note (Addendum)
Onset spring 2019  - Allergy profile 07/25/2018 >  Eos 0.1 /  IgE 15 RAST neg   - 07/25/2018  After extensive coaching inhaler device,  effectiveness =  50% at best from a baseline of 0 > try dulera 100 2bid x 2 weeks > helped but could not afford it   Much better with less need for saba while on low dose lama/ics certainly supports dx of cough variant asthma over uacs but likely has elements of each with ongoing pnds s allergy identified  Rec: rechallenge with ics/laba in form of low dose symb 80 2bid   Advised:  formulary restrictions will be an ongoing challenge for the forseable future and I would be happy to pick an alternative if the pt will first  provide me a list of them -  pt  will need to return here for training for any new device that is required eg dpi vs hfa vs respimat.    In the meantime we can always provide samples so that the patient never runs out of any needed respiratory medications.    Each maintenance medication was reviewed in detail including most importantly the difference between maintenance and as needed and under what circumstances the prns are to be used.  Please see AVS for specific  Instructions which are unique to this visit and I personally typed out  which were reviewed in detail  Over the phone  with the patient and a copy provided via MyChart  F/u in 4 weeks with all meds in hand using a trust but verify approach to confirm accurate Medication  Reconciliation The principal here is that until we are certain that the  patients are doing what we've asked, it makes no sense to ask them to do more.

## 2018-10-01 ENCOUNTER — Encounter: Payer: Self-pay | Admitting: Cardiology

## 2018-10-01 DIAGNOSIS — E1140 Type 2 diabetes mellitus with diabetic neuropathy, unspecified: Secondary | ICD-10-CM | POA: Diagnosis not present

## 2018-10-01 NOTE — Progress Notes (Signed)
Remote pacemaker transmission.   

## 2018-10-16 ENCOUNTER — Ambulatory Visit: Payer: Self-pay | Admitting: Adult Health

## 2018-10-23 DIAGNOSIS — I509 Heart failure, unspecified: Secondary | ICD-10-CM | POA: Diagnosis not present

## 2018-10-23 DIAGNOSIS — G4733 Obstructive sleep apnea (adult) (pediatric): Secondary | ICD-10-CM | POA: Diagnosis not present

## 2018-10-23 DIAGNOSIS — I10 Essential (primary) hypertension: Secondary | ICD-10-CM | POA: Diagnosis not present

## 2018-10-30 ENCOUNTER — Other Ambulatory Visit: Payer: Self-pay

## 2018-10-30 ENCOUNTER — Emergency Department (HOSPITAL_COMMUNITY)
Admission: EM | Admit: 2018-10-30 | Discharge: 2018-10-30 | Disposition: A | Discharge status: Home / Self Care | Payer: Medicare HMO | Attending: Emergency Medicine | Admitting: Emergency Medicine

## 2018-10-30 ENCOUNTER — Emergency Department (HOSPITAL_COMMUNITY): Payer: Medicare HMO

## 2018-10-30 ENCOUNTER — Telehealth: Payer: Self-pay | Admitting: Internal Medicine

## 2018-10-30 DIAGNOSIS — Z794 Long term (current) use of insulin: Secondary | ICD-10-CM | POA: Diagnosis not present

## 2018-10-30 DIAGNOSIS — R438 Other disturbances of smell and taste: Secondary | ICD-10-CM | POA: Insufficient documentation

## 2018-10-30 DIAGNOSIS — E1122 Type 2 diabetes mellitus with diabetic chronic kidney disease: Secondary | ICD-10-CM | POA: Diagnosis not present

## 2018-10-30 DIAGNOSIS — E039 Hypothyroidism, unspecified: Secondary | ICD-10-CM | POA: Diagnosis not present

## 2018-10-30 DIAGNOSIS — Z79899 Other long term (current) drug therapy: Secondary | ICD-10-CM | POA: Insufficient documentation

## 2018-10-30 DIAGNOSIS — Z96653 Presence of artificial knee joint, bilateral: Secondary | ICD-10-CM | POA: Diagnosis not present

## 2018-10-30 DIAGNOSIS — R51 Headache: Secondary | ICD-10-CM | POA: Diagnosis not present

## 2018-10-30 DIAGNOSIS — I130 Hypertensive heart and chronic kidney disease with heart failure and stage 1 through stage 4 chronic kidney disease, or unspecified chronic kidney disease: Secondary | ICD-10-CM | POA: Insufficient documentation

## 2018-10-30 DIAGNOSIS — N183 Chronic kidney disease, stage 3 (moderate): Secondary | ICD-10-CM | POA: Diagnosis not present

## 2018-10-30 DIAGNOSIS — R0981 Nasal congestion: Secondary | ICD-10-CM | POA: Diagnosis not present

## 2018-10-30 DIAGNOSIS — J45909 Unspecified asthma, uncomplicated: Secondary | ICD-10-CM | POA: Diagnosis not present

## 2018-10-30 DIAGNOSIS — I5032 Chronic diastolic (congestive) heart failure: Secondary | ICD-10-CM | POA: Insufficient documentation

## 2018-10-30 DIAGNOSIS — Z950 Presence of cardiac pacemaker: Secondary | ICD-10-CM | POA: Diagnosis not present

## 2018-10-30 DIAGNOSIS — Z20828 Contact with and (suspected) exposure to other viral communicable diseases: Secondary | ICD-10-CM | POA: Insufficient documentation

## 2018-10-30 DIAGNOSIS — Z7902 Long term (current) use of antithrombotics/antiplatelets: Secondary | ICD-10-CM | POA: Diagnosis not present

## 2018-10-30 DIAGNOSIS — R05 Cough: Secondary | ICD-10-CM | POA: Diagnosis not present

## 2018-10-30 NOTE — ED Triage Notes (Signed)
Per patient, PCP sent her to ED for new symptoms of cough, runny nose, loss of smell x 2 days.

## 2018-10-30 NOTE — ED Provider Notes (Signed)
[facility] DEPT Provider Note   CSN: [id number] Arrival date & time: 10/30/18  1409     History   Chief Complaint Chief Complaint  Patient presents with   Cough    loss of smell    Nasal Congestion    HPI Tara Jackson is a 78 y.o. female.     HPI Patient has several weeks of nasal and sinus congestion.  States over the last few days she has had the congestion moved into her chest.  She is had productive cough of yellow sputum.  No definite fevers or chills.  Patient just recently finished course of amoxicillin.  Was advised by her pulmonologist, Dr. Melvyn Novas to come to the emergency department for COVID testing and chest x-ray.  Patient currently denying any chest pain or shortness of breath. Past Medical History:  Diagnosis Date   Anemia    Anxiety    Arthritis    Asthma    Brittle bone disease    Cataract    CKD (chronic kidney disease)    Complication of anesthesia    hard to wake up after anesthesia, trouble turning head   Coronary artery disease, non-occlusive 2014   a. minimal CAD by cath in 2014 with 40% RI stenosis. b. low-risk NST in 11/2014.   Depression    Diabetes mellitus type 2, insulin dependent (HCC)    Diabetic neuropathy (HCC)    Diastolic dysfunction, left ventricle 11/30/14   EF 55%, grade 1 DD   DVT (deep venous thrombosis) ([place])    "18 in RLE; 7 LLE prior to PE" (03/24/2015)   Family history of adverse reaction to anesthesia    " the whole family is hard to wake up"   Glaucoma    H/O hiatal hernia    Hyperlipidemia    Hypertension associated with diabetes ([place])    Hypothyroidism    Kidney stones    "passed them"   Memory loss 06/03/2014   On home oxygen therapy    "2L oxygen concentrator @ night"   Osteoporosis    Oxygen desaturation during sleep    wears 2 liters of oxygen at night    Palpitations 35years ago   Cardionet monitor - revealed mostly normal sinus rhythm, sinus  bradycardia with first-degree A-V block heart rates mostly in the 50s and 60s with some 70s. No arrhythmias, PVCs or PACs noted.   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 s/p [employer] PPM in 03/2015 by Dr. Lovena Le secondary to sinus node dysfunction.    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/2016   Acute renal failure superimposed on chronic kidney disease ([place]) 10/24/2016   Neurocardiogenic syncope 10/23/2016   Chronic anemia 10/11/2016   Pacemaker 04/26/2015   Chronic daily headache 04/07/2015   Symptomatic bradycardia 03/24/2015   Sinus node dysfunction ([place]) 03/24/2015   Chronic diastolic heart failure ([place])    Diabetes mellitus type 2 in obese ([place])    Normal coronary arteries 01/14/2015   Normal cardiac stress test 11/30/2014   Bradycardia 11/28/2014   Type 2 diabetes mellitus with renal manifestations ([place]) 11/28/2014   Morbid obesity ([place]) 11/28/2014   CKD stage 3 due to type 2 diabetes mellitus ([place]) 11/28/2014   Memory loss 06/03/2014   AKI (acute kidney injury) ([place])    Chest pain 04/19/2014   Pleuritic chest pain 04/19/2014   Diabetes mellitus ([place]) 04/19/2014   Edema  of both legs 04/19/2014   Dyspnea    DOE (dyspnea on exertion)    Tremor of right hand 09/20/2013   Headache(784.0) 09/19/2013   Restless legs syndrome (RLS) 09/19/2013   Constipation due to pain medication 08/21/2013   Ankle edema 08/21/2013   PVC's (premature ventricular contractions) 16/11/9602   Diastolic dysfunction, left ventricle    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 Lovena Le   ESOPHAGOGASTRODUODENOSCOPY  08/09/2011   Procedure: ESOPHAGOGASTRODUODENOSCOPY (EGD);  Surgeon: Jeryl Columbia, MD;  Location: [facility] ENDOSCOPY;  Service: Endoscopy;  Laterality: N/A;   ESOPHAGOGASTRODUODENOSCOPY (EGD) WITH PROPOFOL N/A 11/12/2013   Procedure: ESOPHAGOGASTRODUODENOSCOPY (EGD) WITH PROPOFOL;  Surgeon: Jeryl Columbia, MD;  Location: [facility] ENDOSCOPY;  Service: Endoscopy;  Laterality: N/A;   ESOPHAGOGASTRODUODENOSCOPY (EGD) WITH PROPOFOL N/A 05/05/2016   Procedure: ESOPHAGOGASTRODUODENOSCOPY (EGD) WITH PROPOFOL;  Surgeon: Clarene Essex, MD;  Location: [facility] ENDOSCOPY;  Service: Endoscopy;  Laterality: N/A;   GLAUCOMA SURGERY Bilateral 2000s   "laser"   HOT HEMOSTASIS  08/09/2011   Procedure: HOT HEMOSTASIS (ARGON PLASMA COAGULATION/BICAP);  Surgeon: Jeryl Columbia, MD;  Location: [facility];  Service: Endoscopy;  Laterality: N/A;   HOT HEMOSTASIS N/A 11/12/2013   Procedure: HOT HEMOSTASIS (ARGON PLASMA COAGULATION/BICAP);  Surgeon: Jeryl Columbia, MD;  Location: [facility];  Service:  Endoscopy;  Laterality: N/A;   INSERT / REPLACE / REMOVE PACEMAKER  03/24/2015   IRIDOTOMY / IRIDECTOMY Bilateral    JOINT REPLACEMENT     KNEE SURGERY Left done with 2nd left knee replacement   spacer bar placement   LEFT HEART CATHETERIZATION WITH CORONARY ANGIOGRAM N/A 02/14/2012   Procedure: LEFT HEART CATHETERIZATION WITH CORONARY ANGIOGRAM;  Surgeon: Leonie Man, MD;  Location: [facility] CATH LAB;  Service: Cardiovascular:: no evidence of obstructive coronary disease ,low EDP with normal EF   LEV DOPPLER  05/29/2012   RIGHT EXTREM. NORMAL VENOUS DUPLEX   NM MYOCAR PERF WALL MOTION  01/26/2012; 11/2014   a. EF 79%,LV normal,ST SEGMENT CHANGE SUGGESTIVE OF ISCHEMIA.; LOW RISK, EF 60%. No ischemia or infarction. No wall motion abnormality    PACEMAKER  PLACEMENT     REVISION TOTAL KNEE ARTHROPLASTY Left    TONSILLECTOMY     TOTAL KNEE ARTHROPLASTY Bilateral    TRANSTHORACIC ECHOCARDIOGRAM  11/2017   11/2017: EF 60-65%. Gr1 DD. PAP ~33 mmHg.  Mild TR. PPM leads in RA & RV.    TRANSTHORACIC ECHOCARDIOGRAM  2014; October 2016   a. EF 69-45%; normal diastolic pressures, no regional wall motion motion abnormalities;; b/ F 55-60%. Normal wall motion. GR 1 DD. No valve lesions   TRIGGER FINGER RELEASE  11/22/2011   Procedure: RELEASE TRIGGER FINGER/A-1 PULLEY;  Surgeon: Meredith Pel, MD;  Location: [facility];  Service: Orthopedics;  Laterality: Left;  Left trigger thumb release   TRIGGER FINGER RELEASE Right yrs ago     OB History   No obstetric history on file.      Home Medications    Prior to Admission medications   Medication Sig Start Date End Date Taking? Authorizing Provider  acetaminophen (TYLENOL) 500 MG tablet Take 2 tablets (1,000 mg total) by mouth every 6 (six) hours as needed. 06/12/17   Charlesetta Shanks, MD  albuterol (PROVENTIL HFA;VENTOLIN HFA) 108 (90 BASE) MCG/ACT inhaler Inhale 2 puffs into the lungs every 6 (six) hours as needed for wheezing.    [provider]  bisacodyl (FLEET) 10 MG/30ML ENEM Place 10 mg rectally once.    [provider]  budesonide-formoterol (SYMBICORT) 80-4.5 MCG/ACT inhaler Take 2 puffs first thing in am and then another 2 puffs about 12 hours later. 09/28/18   Tanda Rockers, MD  busPIRone (BUSPAR) 10 MG tablet Take 1 tablet (10 mg total) by mouth every morning. 06/03/14   Garvin Fila, MD  Cholecalciferol (VITAMIN D-3 PO) Take 2 tablets by mouth every morning.     [provider]  clonazePAM (KLONOPIN) 0.5 MG tablet Take 0.5-1 mg by mouth See admin instructions. Takes 2 tabs in am and 1 tab in pm.    [provider]  clopidogrel (PLAVIX) 75 MG tablet Take 75 mg by mouth every morning.    [provider]  dicyclomine (BENTYL) 10 MG capsule Take 1 capsule (10 mg total) by mouth 3 (three) times daily before meals. 09/14/17   Geradine Girt, DO  DM-APAP-CPM (VICKS FORMULA 44 COUGH/COLD PM PO) Take by mouth. Used 1/2 cup    [provider]  donepezil (ARICEPT) 10 MG tablet TAKE 1 TABLET BY MOUTH EVERYDAY AT BEDTIME 10/01/18   Garvin Fila, MD  esomeprazole (NEXIUM) 40 MG capsule Take 30-60 min before first meal of the day 07/25/18   Tanda Rockers, MD  famotidine (PEPCID) 20 MG tablet One after supper 07/25/18   Tanda Rockers, MD  furosemide (LASIX) 20 MG tablet Take 1 tablet (20 mg total) by mouth daily as needed for fluid or edema. 09/14/17 02/20/18  Geradine Girt, DO  glipiZIDE (GLUCOTROL) 10 MG tablet Take 20 mg by mouth 2 (two) times daily before a meal.  05/03/14   [provider]  guaiFENesin (MUCINEX) 600 MG 12 hr tablet Take 600 mg by mouth 2 (two) times daily as needed for cough.    [provider]  insulin NPH (HUMULIN N,NOVOLIN N) 100 UNIT/ML injection Inject 0-20 Units into the skin See admin instructions. Novolin-N Give 20 unit in morning; give  5 units at 4pm; evening dose as needed.  If less than 150 blood sugar= 0units.  If greater than 200  blood sugar= give 10units to decrease by 100  blood sugar    [provider]  insulin regular (NOVOLIN R,HUMULIN R) 100 units/mL injection Inject 0-25 Units into the skin See admin instructions. Give 25 unit in morning; give 20units at 4pm; evening dose as needed.  If less than 150 blood sugar= 0units.  If greater than 200 blood sugar= give 10units to decrease by 100 blood sugar    [provider]  ipratropium (ATROVENT) 0.03 % nasal spray Place 2 sprays into both nostrils every 12 (twelve) hours. Patient taking differently: Place 2 sprays into both nostrils every 12 (twelve) hours as needed for rhinitis.  10/14/16   Patrecia Pour, MD  levothyroxine (SYNTHROID, LEVOTHROID) 100 MCG tablet Take 88 mcg by mouth every morning.     [provider]  lidocaine (LIDODERM) 5 % Place 3 patches onto the skin daily as needed (pain). Remove & Discard patch within 12 hours or as directed by MD    [provider]  linaclotide (LINZESS) 290 MCG CAPS capsule Take 290 mcg by mouth daily before breakfast.    [provider]  LORazepam (ATIVAN) 0.5 MG tablet Take 0.5 mg by mouth daily as needed for anxiety.    [provider]  magnesium 30 MG tablet Take 30 mg by mouth daily.    [provider]  midodrine (PROAMATINE) 2.5 MG tablet Take 1 tablet (2.5 mg total) by mouth 3 (three) times daily with meals. 10/19/17   Leonie Man, MD  nortriptyline (PAMELOR) 50 MG capsule TAKE 2 CAPSULES (100MG ) AT BEDTIME Patient taking differently: TAKE 1 CAPSULES (100MG ) AT two times daily 06/02/16   Garvin Fila, MD  ondansetron (ZOFRAN) 4 MG tablet Take 4 mg by mouth as needed. 08/16/17   [provider]  OXYGEN Inhale 2 L into the lungs as needed. Used at overnight with a Estate manager/land agent, Historical, MD  polyethylene glycol (MIRALAX / GLYCOLAX) packet Take 17 g by mouth daily as needed for mild constipation.     [provider]  potassium  chloride SA (K-DUR,KLOR-CON) 20 MEQ tablet Take 1 tablet (20 mEq total) by mouth 2 (two) times daily. 06/22/15   Davonna Belling, MD  predniSONE (DELTASONE) 10 MG tablet Take  4 each am x 2 days,   2 each am x 2 days,  1 each am x 2 days and stop 09/28/18   Tanda Rockers, MD  rosuvastatin (CRESTOR) 20 MG tablet Take 20 mg by mouth daily.  03/13/17   [provider]  solifenacin (VESICARE) 10 MG tablet Take 10 mg by mouth daily as needed (overactive bladder).     [provider]  terconazole (TERAZOL 7) 0.4 % vaginal cream Place 1 applicator vaginally at bedtime as needed (for yeast infection).  04/18/14   [provider]  traMADol (ULTRAM) 50 MG tablet Take 1 tablet (50 mg total) by mouth every 12 (twelve) hours as needed for moderate pain. Patient taking differently: Take 50 mg by mouth 2 (two) times daily as needed for moderate pain.  03/27/15   Leanor Kail, PA    Family History Family History  Problem Relation Age of Onset   Cancer Mother    Diabetes Mother    Cancer - Prostate Father    Diabetes Father    Retinal detachment Son    Diabetes Sister    Diabetes Brother    Diabetes Paternal Grandmother     Social History Social History   Tobacco Use   Smoking status:  Never Smoker   Smokeless tobacco: Never Used  Substance Use Topics   Alcohol use: No    Alcohol/week: 0.0 standard drinks   Drug use: No     Allergies   Iodine, Iohexol, Metoclopramide hcl, Sulfa antibiotics, Lactose intolerance (gi), and Lansoprazole   Review of Systems Review of Systems  Constitutional: Negative for appetite change, chills and fever.  HENT: Positive for congestion, postnasal drip and sinus pressure. Negative for sore throat and trouble swallowing.   Eyes: Negative for pain and visual disturbance.  Respiratory: Positive for cough and wheezing. Negative for shortness of breath.   Cardiovascular: Negative for chest pain, palpitations and leg  swelling.  Gastrointestinal: Negative for abdominal pain, constipation, diarrhea, nausea and vomiting.  Musculoskeletal: Negative for back pain, myalgias and neck pain.  Skin: Negative for rash and wound.  Neurological: Negative for dizziness, weakness, light-headedness, numbness and headaches.  All other systems reviewed and are negative.    Physical Exam Updated Vital Signs BP (!) 166/70 (BP Location: Right Arm)    Pulse 60    Temp 98 F (36.7 C) (Oral)    Resp 18    Ht 5\' 4"  (1.626 m)    Wt 117.1 kg    SpO2 98%    BMI 44.31 kg/m   Physical Exam Vitals signs and nursing note reviewed.  Constitutional:      Appearance: Normal appearance. She is well-developed.  HENT:     Head: Normocephalic and atraumatic.     Nose: Congestion present.     Comments: Bilateral nasal mucosa with edema.  Oropharynx is clear. Eyes:     Extraocular Movements: Extraocular movements intact.     Pupils: Pupils are equal, round, and reactive to light.  Neck:     Musculoskeletal: Normal range of motion and neck supple. No neck rigidity or muscular tenderness.  Cardiovascular:     Rate and Rhythm: Normal rate and regular rhythm.     Heart sounds: No murmur. No friction rub. No gallop.   Pulmonary:     Effort: Pulmonary effort is normal. No respiratory distress.     Breath sounds: Normal breath sounds. No stridor. No wheezing, rhonchi or rales.  Chest:     Chest wall: No tenderness.  Abdominal:     General: Bowel sounds are normal.     Palpations: Abdomen is soft.     Tenderness: There is no abdominal tenderness. There is no guarding or rebound.  Musculoskeletal: Normal range of motion.        General: No swelling, tenderness, deformity or signs of injury.     Right lower leg: No edema.     Left lower leg: No edema.  Lymphadenopathy:     Cervical: No cervical adenopathy.  Skin:    General: Skin is warm and dry.     Capillary Refill: Capillary refill takes less than 2 seconds.     Findings: No  erythema or rash.  Neurological:     General: No focal deficit present.     Mental Status: She is alert and oriented to person, place, and time.  Psychiatric:        Behavior: Behavior normal.      ED Treatments / Results  Labs (all labs ordered are listed, but only abnormal results are displayed) Labs Reviewed  SARS CORONAVIRUS 2 (TAT 6-24 HRS)    EKG None  Radiology Dg Chest Port 1 View  Result Date: 10/30/2018 CLINICAL DATA:  Cough EXAM: PORTABLE CHEST 1 VIEW COMPARISON:  10/19/2017 FINDINGS: Left-sided implanted cardiac device. The heart size and mediastinal contours are stable. No focal airspace consolidation, pleural effusion, or pneumothorax. IMPRESSION: No acute cardiopulmonary findings. Electronically Signed   By: Davina Poke M.D.   On: 10/30/2018 16:30    Procedures Procedures (including critical care time)  Medications Ordered in ED Medications - No data to display   Initial Impression / Assessment and Plan / ED Course  I have reviewed the triage vital signs and the nursing notes.  Pertinent labs & imaging results that were available during my care of the patient were reviewed by me and considered in my medical decision making (see chart for details).        Patient is very well-appearing.  No respiratory distress.  Will screen with chest x-ray and COVID testing. X-ray without acute findings.  Vital signs are stable.  Patient is in no respiratory distress.  Lungs are clear.  Advised to follow-up closely with her pulmonologist.  Return precautions given. Final Clinical Impressions(s) / ED Diagnoses   Final diagnoses:  Nasal sinus congestion    ED Discharge Orders    None       Julianne Waybright, MD 10/30/18 1720

## 2018-10-30 NOTE — ED Notes (Signed)
Patient walking to door, opening door and hanging head out into hallway. Educated patient with current symptoms she must keep door closed.

## 2018-10-30 NOTE — Telephone Encounter (Signed)
Called and spoke to pt. Informed her of the recs per MW. Pt verbalized understanding and denied any further questions or concerns at this time.   

## 2018-10-30 NOTE — Telephone Encounter (Signed)
Called and spoke to pt. Pt c/o sinus congestion, headache, rhinorrhea x 4 weeks that has now progressed to her chest. Pt c/o chest congestion, prod cough with yellow mucus, chest tightness x 3-4 days. Pt denies increase in SOB, f/c/s, or known COVID contact. Pt states her PCP prescribed Amoxicillin (14 tabs, to take BID), pt states she is taking the last one tomorrow but it hasnt helped at all. Pt states she is also taking the AlkaSeltzer Cold and Flu. She also requests for a CXR as she thinks she may have pna as this is how she felt the last time she had pna.   Dr. Melvyn Novas please advise. Thanks.    Instructions from 8/21:  Plan A = Automatic =start symbicort 80 Take 2 puffs first thing in am and then another 2 puffs about 12 hours later.   And take Prednisone 10 mg take  4 each am x 2 days,   2 each am x 2 days,  1 each am x 2 days and stop    Work on inhaler technique:  relax and gently blow all the way out then take a nice smooth deep breath back in, triggering the inhaler at same time you start breathing in.  Hold for up to 5 seconds if you can. Blow out [employer]  thru nose. Rinse and gargle with water when done    Plan B = Backup Only use your albuterol inhaler as a rescue medication to be used if you can't catch your breath by resting or doing a relaxed purse lip breathing pattern.  - The less you use it, the better it will work when you need it. - Ok to use the inhaler up to 2 puffs  every 4 hours if you must but call for appointment if use goes up over your usual need - Don't leave home without it !!  (think of it like the spare tire for your car)    Please schedule a follow up office visit in 4 weeks, sooner if needed  with all medications /inhalers/ solutions in hand so we can verify exactly what you are taking. This includes all medications from all doctors and over the counters

## 2018-10-30 NOTE — Telephone Encounter (Signed)
Amoxicillin should have helped even if she does have pneumonia  The problem is that the worsening symptoms esp the chest tightness are non-specific and can just as likely  be due to covid which we can't dx here so best bet is to go the ER and have them do the w/u there (since they can do the cxr and the covid test)   If the covid is neg then we can address her problem here the next day

## 2018-10-31 LAB — SARS CORONAVIRUS 2 (TAT 6-24 HRS): SARS Coronavirus 2: NEGATIVE

## 2018-11-01 ENCOUNTER — Telehealth: Payer: Self-pay | Admitting: Internal Medicine

## 2018-11-01 NOTE — Telephone Encounter (Signed)
Called and spoke to patient.  Patient stated that she went to the ED due to productive cough (clear), head cold she can't get rid of with runny nose, sore throat, and voice is hoarse. Patient stated she had her flu shot 3 weeks ago and hasn't felt well since.  Patient was tested in ED for COVID and was negative.  Patient would like something sent to [facility] on [address].  Dr. Melvyn Novas please advise! Thank you!

## 2018-11-01 NOTE — Telephone Encounter (Signed)
Ov with all meds in hand asap to see me or NP if I don't have availability

## 2018-11-01 NOTE — Telephone Encounter (Signed)
Called spoke with patient, got her scheduled to see Dr. Melvyn Novas at [phone] for all meds in hand. Nothing further needed

## 2018-11-02 ENCOUNTER — Ambulatory Visit: Payer: Medicare HMO | Admitting: Internal Medicine

## 2018-11-02 ENCOUNTER — Other Ambulatory Visit: Payer: Self-pay

## 2018-11-02 ENCOUNTER — Encounter: Payer: Self-pay | Admitting: Internal Medicine

## 2018-11-02 VITALS — BP 130/70 | HR 60 | Temp 97.1°F | Ht 67.5 in | Wt 265.2 lb

## 2018-11-02 DIAGNOSIS — J9611 Chronic respiratory failure with hypoxia: Secondary | ICD-10-CM | POA: Diagnosis not present

## 2018-11-02 DIAGNOSIS — J45991 Cough variant asthma: Secondary | ICD-10-CM

## 2018-11-02 DIAGNOSIS — I493 Ventricular premature depolarization: Secondary | ICD-10-CM | POA: Diagnosis not present

## 2018-11-02 MED ORDER — PREDNISONE 10 MG PO TABS: ORAL_TABLET | ORAL | 0 refills | Status: DC

## 2018-11-02 MED ORDER — BUDESONIDE-FORMOTEROL FUMARATE 80-4.5 MCG/ACT IN AERO: 2.0000 | INHALATION_SPRAY | Freq: Two times a day (BID) | RESPIRATORY_TRACT | 0 refills | Status: DC

## 2018-11-02 NOTE — Patient Instructions (Addendum)
For drainage / throat tickle try take CHLORPHENIRAMINE  4 mg  (Chlortab 4mg   at [employer] should be easiest to find in the green box)  take one every 4 hours as needed - available over the counter- may cause drowsiness so start with just a bedtime dose or two and see how you tolerate it before trying in daytime    Prednisone 10 mg take  4 each am x 2 days,   2 each am x 2 days,  1 each am x 2 days and stop   Plan A = Automatic = Always=    Symbicort 80 Take 2 puffs first thing in am and then another 2 puffs about 12 hours later.    Work on inhaler technique:  relax and gently blow all the way out then take a nice smooth deep breath back in, triggering the inhaler at same time you start breathing in.  Hold for up to 5 seconds if you can. Blow symbicort out thru nose. Rinse and gargle with water when done     Plan B = Backup (to supplement plan A, not to replace it) Only use your albuterol inhaler as a rescue medication to be used if you can't catch your breath by resting or doing a relaxed purse lip breathing pattern.  - The less you use it, the better it will work when you need it. - Ok to use the inhaler up to 2 puffs  every 4 hours if you must but call for appointment if use goes up over your usual need - Don't leave home without it !!  (think of it like the spare tire for your car)      Please schedule a follow up office visit in 4 weeks, sooner if needed - bring your respiratory medications

## 2018-11-02 NOTE — Progress Notes (Signed)
Tara Jackson, female    DOB: 1940/05/13      MRN: [id number]   Brief patient profile:  58  yobf RD with h/o dvt/pe p developing knee problems but Breathing never got back 100% and worse since around 2014 and requiring 02 hs assoc  freq cough/ sense of pnds Nathaniel Man x spring /summer of 2019 so referred to pulmonary clinic 07/25/2018 by Dr  Timothy Lasso      History of Present Illness  07/25/2018  Pulmonary/ 1st office eval/Tara Jackson  Chief Complaint  Patient presents with   Consult    DME-[employer] on O2@2L  at night. has been having alot of "colds"coug, sinus drainage since 06-11-2018 on average using rescue inh. 2 time a day 4 days a week.  Dyspnea:  Better p albuterol / better p prednisone  Cough: since may 4th worse than usual  assoc with pnds /clear  Mucus/ not noct but severe to  point of gag/vomit Sleep: on side bed is flat/ 2 pillows variably cough/sob  SABA use: some better albuterol  Already tried 3 abx/ tessalon no better per office notes  Note flare off gerd rx which prev took per Dr Ewing Schlein  rec rec Plan A = Automatic = dulera 100 Take 2 puffs first thing in am and then another 2 puffs about 12 hours later - do not refill if not helping Work on inhaler technique:  Plan B = Backup Only use your albuterol inhaler as a rescue medication  Nexium  40 mg   Take  30-60 min before first meal of the day and Pepcid (famotidine)  20 mg one after supper  until return to office - this is the best way to tell whether stomach acid is contributing to your problem.   GERD diet      09/28/18  History of Present Illness: Dyspnea:  Much better while on dulera / less need albuterol / worse since ran out of sample and can't afford dulera on her plan Cough: more hoarse with sense of PNDS  but no real cough  Sleeping: bothered by nose running  SABA use: twice daily  02: 2lpm hs  rec Plan A = Automatic =start symbicort 80 Take 2 puffs first thing in am and then another 2 puffs about 12 hours later.    And take Prednisone 10 mg take  4 each am x 2 days,   2 each am x 2 days,  1 each am x 2 days and stop   Work on inhaler technique:  Plan B = Backup Only use your albuterol inhaler as a rescue medication     11/02/2018  Acute post ER f/u ov  ov/Tara Jackson re: cough variant asthma vs uacs  Chief Complaint  Patient presents with   Sinus drainage, out of breath    Does not feel any better since the hospital visit on 10/30/18 for sinus infection.  Dyspnea:  Room to room holding onto wall - poor balance and strength Cough: assoc with rhinorrhea, watery, some pnds no resp to atrovent ns  Sleeping: bed is flat disturbed by nasal symptoms > cough wheeze or sob    SABA use: none at all  02: 2lpm hs sometimes    No obvious day to day or daytime variability or assoc excess/ purulent sputum or mucus plugs or hemoptysis or cp or chest tightness, subjective wheeze or overt   hb symptoms.     Also denies any obvious fluctuation of symptoms with weather or environmental changes or other  aggravating or alleviating factors except as outlined above   No unusual exposure hx or h/o childhood pna/ asthma or knowledge of premature birth.  Current Allergies, Complete Past Medical History, Past Surgical History, Family History, and Social History were reviewed in [employer] record.  ROS  The following are not active complaints unless bolded Hoarseness, sore throat, dysphagia, dental problems, itching, sneezing,  nasal congestion or discharge of excess mucus or purulent secretions, ear ache,   fever, chills, sweats, unintended wt loss or wt gain, classically pleuritic or exertional cp,  orthopnea pnd or arm/hand swelling  or leg swelling, presyncope, palpitations, abdominal pain, anorexia, nausea, vomiting, diarrhea  or change in bowel habits or change in bladder habits, change in stools or change in urine, dysuria, hematuria,  rash, arthralgias, visual complaints, headache, numbness, weakness  or ataxia or problems with walking or coordination,  change in mood or  memory.        Current Meds  Medication Sig   acetaminophen (TYLENOL) 500 MG tablet Take 2 tablets (1,000 mg total) by mouth every 6 (six) hours as needed.   amoxicillin (AMOXIL) 875 MG tablet Take 875 mg by mouth 2 (two) times daily.   benzonatate (TESSALON) 100 MG capsule Take 100 mg by mouth 3 (three) times daily as needed for cough.   budesonide-formoterol (SYMBICORT) 80-4.5 MCG/ACT inhaler Take 2 puffs first thing in am and then another 2 puffs about 12 hours later.   busPIRone (BUSPAR) 10 MG tablet Take 1 tablet (10 mg total) by mouth every morning.   Cholecalciferol (VITAMIN D-3 PO) Take 2 tablets by mouth every morning.    clonazePAM (KLONOPIN) 0.5 MG tablet Take 0.5-1 mg by mouth See admin instructions. Takes 2 tabs in am and 1 tab in pm.   clopidogrel (PLAVIX) 75 MG tablet Take 75 mg by mouth every morning.   cyanocobalamin 100 MCG tablet Take 100 mcg by mouth daily.   dicyclomine (BENTYL) 10 MG capsule Take 1 capsule (10 mg total) by mouth 3 (three) times daily before meals.   DM-APAP-CPM (VICKS FORMULA 44 COUGH/COLD PM PO) Take by mouth. Used 1/2 cup   donepezil (ARICEPT) 10 MG tablet TAKE 1 TABLET BY MOUTH EVERYDAY AT BEDTIME   esomeprazole (NEXIUM) 40 MG capsule Take 30-60 min before first meal of the day   famotidine (PEPCID) 20 MG tablet One after supper   glipiZIDE (GLUCOTROL) 10 MG tablet Take 20 mg by mouth 2 (two) times daily before a meal.    guaiFENesin (MUCINEX) 600 MG 12 hr tablet Take 600 mg by mouth 2 (two) times daily as needed for cough.   insulin NPH (HUMULIN N,NOVOLIN N) 100 UNIT/ML injection Inject 0-20 Units into the skin See admin instructions. Novolin-N Give 20 unit in morning; give  5 units at 4pm; evening dose as needed.  If less than 150 blood sugar= 0units.  If greater than 200 blood sugar= give 10units to decrease by 100 blood sugar   insulin regular (NOVOLIN  R,HUMULIN R) 100 units/mL injection Inject 0-25 Units into the skin See admin instructions. Give 25 unit in morning; give 20units at 4pm; evening dose as needed.  If less than 150 blood sugar= 0units.  If greater than 200 blood sugar= give 10units to decrease by 100 blood sugar   ipratropium (ATROVENT) 0.03 % nasal spray Place 2 sprays into both nostrils every 12 (twelve) hours. (Patient taking differently: Place 2 sprays into both nostrils every 12 (twelve) hours as needed for rhinitis. )  levothyroxine (SYNTHROID, LEVOTHROID) 100 MCG tablet Take 88 mcg by mouth every morning.    lidocaine (LIDODERM) 5 % Place 3 patches onto the skin daily as needed (pain). Remove & Discard patch within 12 hours or as directed by MD   linaclotide (LINZESS) 290 MCG CAPS capsule Take 290 mcg by mouth daily before breakfast.   LORazepam (ATIVAN) 0.5 MG tablet Take 0.5 mg by mouth daily as needed for anxiety.   Menthol-Methyl Salicylate (MUSCLE RUB) 10-15 % CREA Apply 1 application topically as needed for muscle pain.   midodrine (PROAMATINE) 2.5 MG tablet Take 1 tablet (2.5 mg total) by mouth 3 (three) times daily with meals. (Patient taking differently: Take 2.5 mg by mouth 3 (three) times daily with meals. As needed if blood pressure goes to low.)   nortriptyline (PAMELOR) 50 MG capsule TAKE 2 CAPSULES (100MG ) AT BEDTIME (Patient taking differently: TAKE 1 CAPSULES (100MG ) AT two times daily)   ondansetron (ZOFRAN) 4 MG tablet Take 4 mg by mouth as needed.   OXYGEN Inhale 2 L into the lungs as needed. Used at overnight with a concentrator    polyethylene glycol (MIRALAX / GLYCOLAX) packet Take 17 g by mouth daily as needed for mild constipation.    potassium chloride SA (K-DUR,KLOR-CON) 20 MEQ tablet Take 1 tablet (20 mEq total) by mouth 2 (two) times daily.   Pumpkin Seed-Soy Germ (AZO BLADDER CONTROL/GO-LESS PO) Take by mouth daily as needed.   rosuvastatin (CRESTOR) 20 MG tablet Take 20 mg by mouth  daily.    sodium chloride 0.9 % SOLN 100 mL with phenylephrine 10 MG/ML SOLN 0.2 mL    terconazole (TERAZOL 7) 0.4 % vaginal cream Place 1 applicator vaginally at bedtime as needed (for yeast infection).                Past Medical History:  Diagnosis Date   Anemia    Anxiety    Arthritis    Asthma    Brittle bone disease    Cataract    CKD (chronic kidney disease)    Complication of anesthesia    hard to wake up after anesthesia, trouble turning head   Coronary artery disease, non-occlusive 2014   a. minimal CAD by cath in 2014 with 40% RI stenosis. b. low-risk NST in 11/2014.   Depression    Diabetes mellitus type 2, insulin dependent (HCC)    Diabetic neuropathy (HCC)    Diastolic dysfunction, left ventricle 11/30/14   EF 55%, grade 1 DD   DVT (deep venous thrombosis) (HCC)    "16 in RLE; 7 LLE prior to PE" (03/24/2015)   Family history of adverse reaction to anesthesia    " the whole family is hard to wake up"   Glaucoma    H/O hiatal hernia    Hyperlipidemia    Hypertension associated with diabetes (HCC)    Hypothyroidism    Kidney stones    "passed them"   Memory loss 06/03/2014   On home oxygen therapy    "2L oxygen concentrator @ night"   Osteoporosis    Oxygen desaturation during sleep    wears 2 liters of oxygen at night    Palpitations 35years ago   Cardionet monitor - revealed mostly normal sinus rhythm, sinus bradycardia with first-degree A-V block heart rates mostly in the 50s and 60s with some 70s. No arrhythmias, PVCs or PACs noted.   PE (pulmonary thromboembolism) (HCC) x 3, last one was ~ 2003   history of  recurrent RLE DVT with PE - last PE ~>13 yrs; Maintained on Plavix   Pneumonia    Presence of permanent cardiac pacemaker    Restless legs syndrome    Sleep apnea    cpap disontinued; test done 07/14/2008 ordered per  Byrum   Spinal headache    Spinal stenosis    L 3, L 4 and L 5, and C 1, C 2 and C3    Stroke (HCC) 04-19-2013   tia and stroke. 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 s/p [employer] PPM in 03/2015 by Dr. Ladona Ridgel secondary to sinus node dysfunction.    TIA (transient ischemic attack) March 2014        Objective:    Wt Readings from Last 3 Encounters:  11/02/18 265 lb 3.2 oz (120.3 kg)  10/30/18 258 lb 2.5 oz (117.1 kg)  07/25/18 258 lb 3.2 oz (117.1 kg)    W/c bound elderly bf nad/ raspy voice  Vital signs reviewed - Note on arrival 02 sats  100% on Ra      Reports:Full dentures      HEENT : pt wearing mask not removed for exam due to covid -19 concerns.    NECK :  without JVD/Nodes/TM/ nl carotid upstrokes bilaterally   LUNGS: no acc muscle use,  Nl contour chest with mostly upper airway"wheeze"  without cough on insp or exp maneuvers   CV:  RRR  no s3 or murmur or increase in P2, and no edema   ABD:  soft and nontender with nl inspiratory excursion in the supine position. No bruits or organomegaly appreciated, bowel sounds nl  MS: ext warm without deformities, calf tenderness, cyanosis or clubbing No obvious joint restrictions   SKIN: warm and dry without lesions    NEURO:  alert, approp, nl sensorium with  no motor or cerebellar deficits apparent.    I personally reviewed images and agree with radiology impression as follows:  CXR:   10/30/18  No acute cardiopulmonary findings.   Assessment

## 2018-11-03 ENCOUNTER — Encounter: Payer: Self-pay | Admitting: Internal Medicine

## 2018-11-03 NOTE — Assessment & Plan Note (Addendum)
Using 2lpm at rest and prn   Advised: Make sure you check your oxygen saturations at highest level of activity to be sure it stays over 90% and adjust upward to maintain this level if needed

## 2018-11-03 NOTE — Assessment & Plan Note (Addendum)
Onset spring 2019  - CT sinus 12/21/17 wnl (as seen on head ct) - Allergy profile 07/25/2018 >  Eos 0.1 /  IgE 15 RAST neg   - 07/25/2018   try dulera 100 2bid x 2 weeks > helped but could not afford it  - 11/02/2018  After extensive coaching inhaler device,  effectiveness =    75% but no consistently at this level > symbicort 80 2bid with samples   Still not clear how much is asthma vs uacs = Upper airway cough syndrome (previously labeled PNDS),  is so named because it's frequently impossible to sort out how much is  CR/sinusitis with freq throat clearing (which can be related to primary GERD)   vs  causing  secondary (" extra esophageal")  GERD from wide swings in gastric pressure that occur with throat clearing, often  promoting self use of mint and menthol lozenges that reduce the lower esophageal sphincter tone and exacerbate the problem further in a cyclical fashion.   These are the same pts (now being labeled as having "irritable larynx syndrome" by some cough centers) who not infrequently have a history of having failed to tolerate ace inhibitors,  dry powder inhalers or biphosphonates or report having atypical/extraesophageal reflux symptoms that don't respond to standard doses of PPI  and are easily confused as having aecopd or asthma flares by even experienced allergists/ pulmonologists (myself included).   rec add 1st gen H1 blockers per guidelines  And   Also added 6 days of Prednisone in case of component of Th-2 driven upper or lower airways inflammation (if cough responds short term only to relapse befor return while will on rx for uacs that would point to allergic rhinitis/ asthma or eos bronchitis)

## 2018-11-03 NOTE — Assessment & Plan Note (Signed)
Body mass index is 40.92 kg/m.  -  trending up Lab Results  Component Value Date   TSH 4.194 09/12/2017     Contributing to gerd risk/ doe/reviewed the need and the process to achieve and maintain neg calorie balance > defer f/u primary care including intermittently monitoring thyroid status     I had an extended discussion with the patient/husband reviewing all relevant studies completed to date and  lasting 25 minutes of a 40  minute acute office  visit post -ER     re  severe non-specific but potentially very serious refractory respiratory symptoms of uncertain and potentially multiple  etiologies.   Each maintenance medication was reviewed in detail including most importantly the difference between maintenance and prns and under what circumstances the prns are to be triggered using an action plan format that is not reflected in the computer generated alphabetically organized AVS.    I performed device teaching  using a teach back technique which also  extended face to face time for this visit (see above)   I also attempted to do medication reconciliation but discovered pt uses "her own system" which does not identify meds by names or organize them as maint vs prn or time of day and made very little headway with severe polypharmacy with multiple redundant meds identified eg protonix an nexium) and she showed no interest in learning any new approach as she is convinced hers works fine.   Please see AVS for specific instructions unique to this office visit that I personally wrote and verbalized to the the pt in detail and then reviewed with pt  by my nurse highlighting any changes in therapy/plan of care  recommended at today's visit.

## 2018-11-20 ENCOUNTER — Telehealth (INDEPENDENT_AMBULATORY_CARE_PROVIDER_SITE_OTHER): Payer: Self-pay

## 2018-11-22 DIAGNOSIS — I509 Heart failure, unspecified: Secondary | ICD-10-CM | POA: Diagnosis not present

## 2018-11-22 DIAGNOSIS — I10 Essential (primary) hypertension: Secondary | ICD-10-CM | POA: Diagnosis not present

## 2018-11-22 DIAGNOSIS — G4733 Obstructive sleep apnea (adult) (pediatric): Secondary | ICD-10-CM | POA: Diagnosis not present

## 2018-11-28 ENCOUNTER — Encounter (HOSPITAL_COMMUNITY): Payer: Self-pay

## 2018-11-28 ENCOUNTER — Emergency Department (HOSPITAL_COMMUNITY)
Admission: EM | Admit: 2018-11-28 | Discharge: 2018-11-28 | Disposition: A | Discharge status: Home / Self Care | Payer: Medicare HMO | Attending: Emergency Medicine | Admitting: Emergency Medicine

## 2018-11-28 ENCOUNTER — Other Ambulatory Visit: Payer: Self-pay

## 2018-11-28 DIAGNOSIS — N189 Chronic kidney disease, unspecified: Secondary | ICD-10-CM | POA: Insufficient documentation

## 2018-11-28 DIAGNOSIS — E860 Dehydration: Secondary | ICD-10-CM | POA: Insufficient documentation

## 2018-11-28 DIAGNOSIS — Z794 Long term (current) use of insulin: Secondary | ICD-10-CM | POA: Diagnosis not present

## 2018-11-28 DIAGNOSIS — Z950 Presence of cardiac pacemaker: Secondary | ICD-10-CM | POA: Insufficient documentation

## 2018-11-28 DIAGNOSIS — Z8673 Personal history of transient ischemic attack (TIA), and cerebral infarction without residual deficits: Secondary | ICD-10-CM | POA: Diagnosis not present

## 2018-11-28 DIAGNOSIS — I2510 Atherosclerotic heart disease of native coronary artery without angina pectoris: Secondary | ICD-10-CM | POA: Diagnosis not present

## 2018-11-28 DIAGNOSIS — I5030 Unspecified diastolic (congestive) heart failure: Secondary | ICD-10-CM | POA: Diagnosis not present

## 2018-11-28 DIAGNOSIS — I130 Hypertensive heart and chronic kidney disease with heart failure and stage 1 through stage 4 chronic kidney disease, or unspecified chronic kidney disease: Secondary | ICD-10-CM | POA: Insufficient documentation

## 2018-11-28 DIAGNOSIS — Z79899 Other long term (current) drug therapy: Secondary | ICD-10-CM | POA: Insufficient documentation

## 2018-11-28 DIAGNOSIS — E1122 Type 2 diabetes mellitus with diabetic chronic kidney disease: Secondary | ICD-10-CM | POA: Diagnosis not present

## 2018-11-28 DIAGNOSIS — Z7902 Long term (current) use of antithrombotics/antiplatelets: Secondary | ICD-10-CM | POA: Diagnosis not present

## 2018-11-28 DIAGNOSIS — E039 Hypothyroidism, unspecified: Secondary | ICD-10-CM | POA: Insufficient documentation

## 2018-11-28 DIAGNOSIS — Z96653 Presence of artificial knee joint, bilateral: Secondary | ICD-10-CM | POA: Diagnosis not present

## 2018-11-28 DIAGNOSIS — R238 Other skin changes: Secondary | ICD-10-CM | POA: Diagnosis present

## 2018-11-28 DIAGNOSIS — R739 Hyperglycemia, unspecified: Secondary | ICD-10-CM

## 2018-11-28 DIAGNOSIS — E1165 Type 2 diabetes mellitus with hyperglycemia: Secondary | ICD-10-CM | POA: Insufficient documentation

## 2018-11-28 LAB — CBC WITH DIFFERENTIAL/PLATELET
Abs Immature Granulocytes: 0.05 10*3/uL (ref 0.00–0.07)
Basophils Absolute: 0.1 10*3/uL (ref 0.0–0.1)
Basophils Relative: 1 %
Eosinophils Absolute: 0.1 10*3/uL (ref 0.0–0.5)
Eosinophils Relative: 1 %
HCT: 40.2 % (ref 36.0–46.0)
Hemoglobin: 12.7 g/dL (ref 12.0–15.0)
Immature Granulocytes: 1 %
Lymphocytes Relative: 27 %
Lymphs Abs: 2.1 10*3/uL (ref 0.7–4.0)
MCH: 32.3 pg (ref 26.0–34.0)
MCHC: 31.6 g/dL (ref 30.0–36.0)
MCV: 102.3 fL — ABNORMAL HIGH (ref 80.0–100.0)
Monocytes Absolute: 0.4 10*3/uL (ref 0.1–1.0)
Monocytes Relative: 5 %
Neutro Abs: 5.1 10*3/uL (ref 1.7–7.7)
Neutrophils Relative %: 65 %
Platelets: 237 10*3/uL (ref 150–400)
RBC: 3.93 MIL/uL (ref 3.87–5.11)
RDW: 11.9 % (ref 11.5–15.5)
WBC: 7.8 10*3/uL (ref 4.0–10.5)
nRBC: 0 % (ref 0.0–0.2)

## 2018-11-28 LAB — URINALYSIS, ROUTINE W REFLEX MICROSCOPIC
Bilirubin Urine: NEGATIVE
Glucose, UA: 100 mg/dL — AB
Hgb urine dipstick: NEGATIVE
Ketones, ur: NEGATIVE mg/dL
Leukocytes,Ua: NEGATIVE
Nitrite: NEGATIVE
Protein, ur: NEGATIVE mg/dL
Specific Gravity, Urine: >1.03 — ABNORMAL HIGH (ref 1.005–1.030)
pH: 5.5 (ref 5.0–8.0)

## 2018-11-28 LAB — COMPREHENSIVE METABOLIC PANEL
ALT: 14 U/L (ref 0–44)
AST: 20 U/L (ref 15–41)
Albumin: 4.1 g/dL (ref 3.5–5.0)
Alkaline Phosphatase: 64 U/L (ref 38–126)
Anion gap: 14 (ref 5–15)
BUN: 22 mg/dL (ref 8–23)
CO2: 22 mmol/L (ref 22–32)
Calcium: 9.2 mg/dL (ref 8.9–10.3)
Chloride: 99 mmol/L (ref 98–111)
Creatinine, Ser: 1.64 mg/dL — ABNORMAL HIGH (ref 0.44–1.00)
GFR calc Af Amer: 34 mL/min — ABNORMAL LOW (ref >60)
GFR calc non Af Amer: 29 mL/min — ABNORMAL LOW (ref >60)
Glucose, Bld: 331 mg/dL — ABNORMAL HIGH (ref 70–99)
Potassium: 4.2 mmol/L (ref 3.5–5.1)
Sodium: 135 mmol/L (ref 135–145)
Total Bilirubin: 1.2 mg/dL (ref 0.3–1.2)
Total Protein: 7.5 g/dL (ref 6.5–8.1)

## 2018-11-28 LAB — CBG MONITORING, ED: Glucose-Capillary: 268 mg/dL — ABNORMAL HIGH (ref 70–99)

## 2018-11-28 MED ORDER — SODIUM CHLORIDE 0.9 % IV BOLUS
500.0000 mL | Freq: Once | INTRAVENOUS | Status: AC
  Administered 2018-11-28: 500 mL via INTRAVENOUS

## 2018-11-28 NOTE — ED Triage Notes (Signed)
Patient states, "I am dehydrated. My skin is dry and doing like this. (Patient pulls skin up on hand and it tents). Patient asked if her physician sent her for dehydration. Patient stated "no. I think that is what is wrong with me. I'm suppose to have wrinkles but this is more than a wrinkle."  Patient is drinking [facility] in triage.

## 2018-11-28 NOTE — ED Provider Notes (Signed)
[facility] DEPT Provider Note   CSN: [id number] Arrival date & time: 11/28/18  4970     History   Chief Complaint Chief Complaint  Patient presents with  . skin on hands tenting    HPI Tara Jackson is a 78 y.o. female.     HPI Patient presents to the ED for evaluation of possible dehydration.  Patient states she has noticed over the last 3 days that the hand on her skin is tenting.  Patient knows this can be a sign of dehydration.  She has a history of chronic kidney disease.  Patient has noticed some urinary frequency.  She has had this issues in the past I think some of it is related to her medications that she takes.  Patient has become dehydrated in the past without having issues with nausea and vomiting and this has been attributed to her kidney issues.  Patient denies any issues with vomiting or diarrhea.  She has not had any weakness but she has noticed the urinary issues.  She thinks this may have caused her to become dehydrated. Past Medical History:  Diagnosis Date  . Anemia   . Anxiety   . Arthritis   . Asthma   . Brittle bone disease   . Cataract   . CKD (chronic kidney disease)   . Complication of anesthesia    hard to wake up after anesthesia, trouble turning head  . Coronary artery disease, non-occlusive 2014   a. minimal CAD by cath in 2014 with 40% RI stenosis. b. low-risk NST in 11/2014.  Marland Kitchen Depression   . Diabetes mellitus type 2, insulin dependent ([place])   . Diabetic neuropathy ([place])   . Diastolic dysfunction, left ventricle 11/30/14   EF 55%, grade 1 DD  . DVT (deep venous thrombosis) ([place])    "18 in RLE; 7 LLE prior to PE" (03/24/2015)  . Family history of adverse reaction to anesthesia    " the whole family is hard to wake up"  . Glaucoma   . H/O hiatal hernia   . Hyperlipidemia   . Hypertension associated with diabetes ([place])   . Hypothyroidism   . Kidney stones    "passed them"  . Memory loss 06/03/2014  . On  home oxygen therapy    "2L oxygen concentrator @ night"  . Osteoporosis   . Oxygen desaturation during sleep    wears 2 liters of oxygen at night   . Palpitations 35years ago   Cardionet monitor - revealed mostly normal sinus rhythm, sinus bradycardia with first-degree A-V block heart rates mostly in the 50s and 60s with some 70s. No arrhythmias, PVCs or PACs noted.  . 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 Pneumonia   . Presence of permanent cardiac pacemaker   . Restless legs syndrome   . Sleep apnea    cpap disontinued; test done 07/14/2008 ordered per  [place]rum  . Spinal headache   . Spinal stenosis    L 3, L 4 and L 5, and C 1, C 2 and C3  . Stroke [facility]) 04-19-2013   tia and stroke. Weakness Rt hand  . 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 s/p [employer] PPM in 03/2015 by Dr. Lovena Le secondary to sinus node dysfunction.   Marland Kitchen TIA (transient ischemic attack) March 2014      Past  Surgical History:  Procedure Laterality Date  . ABDOMINAL HYSTERECTOMY    . APPENDECTOMY    . BACK SURGERY     steroid inj  . CATARACT EXTRACTION Bilateral   . CATARACT EXTRACTION W/ INTRAOCULAR LENS  IMPLANT, BILATERAL Bilateral 2000s  . CHOLECYSTECTOMY    . EP IMPLANTABLE DEVICE N/A 03/24/2015   [facility] [employer] PPM, Dr. Lovena Le  . ESOPHAGOGASTRODUODENOSCOPY  08/09/2011   Procedure: ESOPHAGOGASTRODUODENOSCOPY (EGD);  Surgeon: Jeryl Columbia, MD;  Location: [facility];  Service: Endoscopy;  Laterality: N/A;  . ESOPHAGOGASTRODUODENOSCOPY (EGD) WITH PROPOFOL N/A 11/12/2013   Procedure: ESOPHAGOGASTRODUODENOSCOPY (EGD) WITH PROPOFOL;  Surgeon: Jeryl Columbia, MD;  Location: [facility] ENDOSCOPY;  Service: Endoscopy;  Laterality: N/A;  . ESOPHAGOGASTRODUODENOSCOPY (EGD) WITH PROPOFOL N/A 05/05/2016   Procedure: ESOPHAGOGASTRODUODENOSCOPY (EGD) WITH PROPOFOL;  Surgeon: Clarene Essex, MD;  Location: [facility] ENDOSCOPY;   Service: Endoscopy;  Laterality: N/A;  . GLAUCOMA SURGERY Bilateral 2000s   "laser"  . HOT HEMOSTASIS  08/09/2011   Procedure: HOT HEMOSTASIS (ARGON PLASMA COAGULATION/BICAP);  Surgeon: Jeryl Columbia, MD;  Location: [facility]OS[facility];  Service: Endoscopy;  Laterality: N/A;  . HOT HEMOSTASIS N/A 11/12/2013   Procedure: HOT HEMOSTASIS (ARGON PLASMA COAGULATION/BICAP);  Surgeon: Jeryl Columbia, MD;  Location: [facility];  Service: Endoscopy;  Laterality: N/A;  . INSERT / REPLACE / REMOVE PACEMAKER  03/24/2015  . IRIDOTOMY / IRIDECTOMY Bilateral   . JOINT REPLACEMENT    . KNEE SURGERY Left done with 2nd left knee replacement   spacer bar placement  . LEFT HEART CATHETERIZATION WITH CORONARY ANGIOGRAM N/A 02/14/2012   Procedure: LEFT HEART CATHETERIZATION WITH CORONARY ANGIOGRAM;  Surgeon: Leonie Man, MD;  Location: [facility] CATH LAB;  Service: Cardiovascular:: no evidence of obstructive coronary disease ,low EDP with normal EF  . LEV DOPPLER  05/29/2012   RIGHT EXTREM. NORMAL VENOUS DUPLEX  . NM MYOCAR PERF WALL MOTION  01/26/2012; 11/2014   a. EF 79%,LV normal,ST SEGMENT CHANGE SUGGESTIVE OF ISCHEMIA.; LOW RISK, EF 60%. No ischemia or infarction. No wall motion abnormality   . PACEMAKER PLACEMENT    . REVISION TOTAL KNEE ARTHROPLASTY Left   . TONSILLECTOMY    . TOTAL KNEE ARTHROPLASTY Bilateral   . TRANSTHORACIC ECHOCARDIOGRAM  11/2017   11/2017: EF 60-65%. Gr1 DD. PAP ~33 mmHg.  Mild TR. PPM leads in RA & RV.   Marland Kitchen TRANSTHORACIC ECHOCARDIOGRAM  2014; October 2016   a. EF 45-80%; normal diastolic pressures, no regional wall motion motion abnormalities;; b/ F 55-60%. Normal wall motion. GR 1 DD. No valve lesions  . TRIGGER FINGER RELEASE  11/22/2011   Procedure: RELEASE TRIGGER FINGER/A-1 PULLEY;  Surgeon: Meredith Pel, MD;  Location: [facility];  Service: Orthopedics;  Laterality: Left;  Left trigger thumb release  . TRIGGER FINGER RELEASE Right yrs ago     OB History   No obstetric history on file.       Home Medications    Prior to Admission medications   Medication Sig Start Date End Date Taking? Authorizing Provider  acetaminophen (TYLENOL) 500 MG tablet Take 2 tablets (1,000 mg total) by mouth every 6 (six) hours as needed. 06/12/17   Charlesetta Shanks, MD  albuterol (PROVENTIL HFA;VENTOLIN HFA) 108 (90 BASE) MCG/ACT inhaler Inhale 2 puffs into the lungs every 6 (six) hours as needed for wheezing.    [provider]  amoxicillin (AMOXIL) 875 MG tablet Take 875 mg by mouth 2 (two) times daily.    [provider]  benzonatate (TESSALON) 100  MG capsule Take 100 mg by mouth 3 (three) times daily as needed for cough.    [provider]  bisacodyl (FLEET) 10 MG/30ML ENEM Place 10 mg rectally once.    [provider]  budesonide-formoterol (SYMBICORT) 80-4.5 MCG/ACT inhaler Take 2 puffs first thing in am and then another 2 puffs about 12 hours later. 09/28/18   Tanda Rockers, MD  budesonide-formoterol (SYMBICORT) 80-4.5 MCG/ACT inhaler Inhale 2 puffs into the lungs 2 (two) times daily. 11/02/18   Tanda Rockers, MD  busPIRone (BUSPAR) 10 MG tablet Take 1 tablet (10 mg total) by mouth every morning. 06/03/14   Garvin Fila, MD  Cholecalciferol (VITAMIN D-3 PO) Take 2 tablets by mouth every morning.     [provider]  clonazePAM (KLONOPIN) 0.5 MG tablet Take 0.5-1 mg by mouth See admin instructions. Takes 2 tabs in am and 1 tab in pm.    [provider]  clopidogrel (PLAVIX) 75 MG tablet Take 75 mg by mouth every morning.    [provider]  cyanocobalamin 100 MCG tablet Take 100 mcg by mouth daily.    [provider]  dicyclomine (BENTYL) 10 MG capsule Take 1 capsule (10 mg total) by mouth 3 (three) times daily before meals. 09/14/17   Geradine Girt, DO  DM-APAP-CPM (VICKS FORMULA 44 COUGH/COLD PM PO) Take by mouth. Used 1/2 cup    [provider]  donepezil (ARICEPT) 10 MG tablet TAKE 1 TABLET BY MOUTH EVERYDAY  AT BEDTIME 10/01/18   Garvin Fila, MD  esomeprazole (NEXIUM) 40 MG capsule Take 30-60 min before first meal of the day 07/25/18   Tanda Rockers, MD  famotidine (PEPCID) 20 MG tablet One after supper 07/25/18   Tanda Rockers, MD  furosemide (LASIX) 20 MG tablet Take 1 tablet (20 mg total) by mouth daily as needed for fluid or edema. 09/14/17 02/20/18  Geradine Girt, DO  glipiZIDE (GLUCOTROL) 10 MG tablet Take 20 mg by mouth 2 (two) times daily before a meal.  05/03/14   [provider]  guaiFENesin (MUCINEX) 600 MG 12 hr tablet Take 600 mg by mouth 2 (two) times daily as needed for cough.    [provider]  insulin NPH (HUMULIN N,NOVOLIN N) 100 UNIT/ML injection Inject 0-20 Units into the skin See admin instructions. Novolin-N Give 20 unit in morning; give  5 units at 4pm; evening dose as needed.  If less than 150 blood sugar= 0units.  If greater than 200 blood sugar= give 10units to decrease by 100 blood sugar    [provider]  insulin regular (NOVOLIN R,HUMULIN R) 100 units/mL injection Inject 0-25 Units into the skin See admin instructions. Give 25 unit in morning; give 20units at 4pm; evening dose as needed.  If less than 150 blood sugar= 0units.  If greater than 200 blood sugar= give 10units to decrease by 100 blood sugar    [provider]  levothyroxine (SYNTHROID, LEVOTHROID) 100 MCG tablet Take 88 mcg by mouth every morning.     [provider]  lidocaine (LIDODERM) 5 % Place 3 patches onto the skin daily as needed (pain). Remove & Discard patch within 12 hours or as directed by MD    [provider]  linaclotide (LINZESS) 290 MCG CAPS capsule Take 290 mcg by mouth daily before breakfast.    [provider]  LORazepam (ATIVAN) 0.5 MG tablet Take 0.5 mg by mouth daily as needed for anxiety.  [provider]  magnesium 30 MG tablet Take 30 mg by mouth daily.    [provider]  Menthol-Methyl Salicylate  (MUSCLE RUB) 10-15 % CREA Apply 1 application topically as needed for muscle pain.    [provider]  midodrine (PROAMATINE) 2.5 MG tablet Take 1 tablet (2.5 mg total) by mouth 3 (three) times daily with meals. Patient taking differently: Take 2.5 mg by mouth 3 (three) times daily with meals. As needed if blood pressure goes to low. 10/19/17   Leonie Man, MD  nortriptyline (PAMELOR) 50 MG capsule TAKE 2 CAPSULES (100MG ) AT BEDTIME Patient taking differently: TAKE 1 CAPSULES (100MG ) AT two times daily 06/02/16   Garvin Fila, MD  ondansetron (ZOFRAN) 4 MG tablet Take 4 mg by mouth as needed. 08/16/17   [provider]  OXYGEN Inhale 2 L into the lungs as needed. Used at overnight with a Estate manager/land agent, Historical, MD  polyethylene glycol (MIRALAX / GLYCOLAX) packet Take 17 g by mouth daily as needed for mild constipation.     [provider]  potassium chloride SA (K-DUR,KLOR-CON) 20 MEQ tablet Take 1 tablet (20 mEq total) by mouth 2 (two) times daily. 06/22/15   Davonna Belling, MD  predniSONE (DELTASONE) 10 MG tablet Take  4 each am x 2 days,   2 each am x 2 days,  1 each am x 2 days and stop 11/02/18   Tanda Rockers, MD  Pumpkin Seed-Soy Germ (AZO BLADDER CONTROL/GO-LESS PO) Take by mouth daily as needed.    [provider]  rosuvastatin (CRESTOR) 20 MG tablet Take 20 mg by mouth daily.  03/13/17   [provider]  sodium chloride 0.9 % SOLN 100 mL with phenylephrine 10 MG/ML SOLN 0.2 mL     [provider]  solifenacin (VESICARE) 10 MG tablet Take 10 mg by mouth daily as needed (overactive bladder).     [provider]  terconazole (TERAZOL 7) 0.4 % vaginal cream Place 1 applicator vaginally at bedtime as needed (for yeast infection).  04/18/14   [provider]  traMADol (ULTRAM) 50 MG tablet Take 1 tablet (50 mg total) by mouth every 12 (twelve) hours as needed for moderate pain. Patient not taking: Reported  on 11/02/2018 03/27/15   Leanor Kail, PA    Family History Family History  Problem Relation Age of Onset  . Cancer Mother   . Diabetes Mother   . Cancer - Prostate Father   . Diabetes Father   . Retinal detachment Son   . Diabetes Sister   . Diabetes Brother   . Diabetes Paternal Grandmother     Social History Social History   Tobacco Use  . Smoking status: Never Smoker  . Smokeless tobacco: Never Used  Substance Use Topics  . Alcohol use: No    Alcohol/week: 0.0 standard drinks  . Drug use: No     Allergies   Iodine, Iohexol, Metoclopramide hcl, Sulfa antibiotics, Lactose intolerance (gi), and Lansoprazole   Review of Systems Review of Systems  Constitutional: Negative for fever.  Respiratory: Negative for choking and shortness of breath.   Cardiovascular: Negative for chest pain.  Gastrointestinal: Negative for abdominal pain.  All other systems reviewed and are negative.    Physical Exam Updated Vital Signs BP (!) 169/84   Pulse (!) 59   Temp 98.4 F (36.9 C) (Oral)   Resp 18   Ht 1.626 m (5\' 4" )   Wt  117.1 kg   SpO2 99%   BMI 44.31 kg/m   Physical Exam Vitals signs and nursing note reviewed.  Constitutional:      General: She is not in acute distress.    Appearance: She is well-developed.  HENT:     Head: Normocephalic and atraumatic.     Right Ear: External ear normal.     Left Ear: External ear normal.  Eyes:     General: No scleral icterus.       Right eye: No discharge.        Left eye: No discharge.     Conjunctiva/sclera: Conjunctivae normal.  Neck:     Musculoskeletal: Neck supple.     Trachea: No tracheal deviation.  Cardiovascular:     Rate and Rhythm: Normal rate and regular rhythm.  Pulmonary:     Effort: Pulmonary effort is normal. No respiratory distress.     Breath sounds: Normal breath sounds. No stridor. No wheezing or rales.  Abdominal:     General: Bowel sounds are normal. There is no distension.      Palpations: Abdomen is soft.     Tenderness: There is no abdominal tenderness. There is no guarding or rebound.  Musculoskeletal:        General: No tenderness.  Skin:    General: Skin is warm and dry.     Findings: No rash.     Comments: Tenting of the skin noted when the patient pinched the skin over her hand  Neurological:     Mental Status: She is alert.     Cranial Nerves: No cranial nerve deficit (no facial droop, extraocular movements intact, no slurred speech).     Sensory: No sensory deficit.     Motor: No abnormal muscle tone or seizure activity.     Coordination: Coordination normal.      ED Treatments / Results  Labs (all labs ordered are listed, but only abnormal results are displayed) Labs Reviewed  COMPREHENSIVE METABOLIC PANEL - Abnormal; Notable for the following components:      Result Value   Glucose, Bld 331 (*)    Creatinine, Ser 1.64 (*)    GFR calc non Af Amer 29 (*)    GFR calc Af Amer 34 (*)    All other components within normal limits  CBC WITH DIFFERENTIAL/PLATELET - Abnormal; Notable for the following components:   MCV 102.3 (*)    All other components within normal limits  URINALYSIS, ROUTINE W REFLEX MICROSCOPIC - Abnormal; Notable for the following components:   Specific Gravity, Urine >1.030 (*)    Glucose, UA 100 (*)    All other components within normal limits  CBG MONITORING, ED - Abnormal; Notable for the following components:   Glucose-Capillary 268 (*)    All other components within normal limits    EKG None  Radiology No results found.  Procedures Procedures (including critical care time)  Medications Ordered in ED Medications  sodium chloride 0.9 % bolus 500 mL (500 mLs Intravenous Bolus from Bag 11/28/18 1313)     Initial Impression / Assessment and Plan / ED Course  I have reviewed the triage vital signs and the nursing notes.  Pertinent labs & imaging results that were available during my care of the patient were  reviewed by me and considered in my medical decision making (see chart for details).  Clinical Course as of Nov 28 1438  Wed Nov 28, 2018  1301 Creatinine is stable.  Blood sugar is  elevated.  Specific gravity of urine is elevated.  I will order patient a small fluid bolus   [JK]    Clinical Course User Index [JK] Dorie Rank, MD     Patient was concerned about dehydration because of the tenting of her skin.  Patient overall clinically appears well-hydrated.  Her laboratory tests were notable for hyperglycemia but no signs of any acute kidney injury.  Suspect the patient could have some mild dehydration based on the urine specific gravity associated with her hyperglycemia but she overall appears well.  Some of her skin appearance of certainly associated with decreased elasticity of her skin with aging.  Encourage patient continue your current medications.  Follow-up with your primary care doctor.  Final Clinical Impressions(s) / ED Diagnoses   Final diagnoses:  Hyperglycemia  Dehydration    ED Discharge Orders    None       Dorie Rank, MD 11/28/18 1441

## 2018-11-28 NOTE — ED Notes (Signed)
ED Provider at bedside. 

## 2018-11-28 NOTE — ED Notes (Signed)
An After Visit Summary was printed and given to the patient. Discharge instructions given and no further questions at this time. Pt states husband is driving her home, pt A&Ox4.

## 2018-11-30 ENCOUNTER — Encounter (INDEPENDENT_AMBULATORY_CARE_PROVIDER_SITE_OTHER): Payer: Self-pay | Admitting: Ophthalmology

## 2018-11-30 ENCOUNTER — Ambulatory Visit (INDEPENDENT_AMBULATORY_CARE_PROVIDER_SITE_OTHER): Payer: Medicare HMO | Admitting: Ophthalmology

## 2018-11-30 ENCOUNTER — Other Ambulatory Visit: Payer: Self-pay

## 2018-11-30 DIAGNOSIS — Z961 Presence of intraocular lens: Secondary | ICD-10-CM

## 2018-11-30 DIAGNOSIS — H35723 Serous detachment of retinal pigment epithelium, bilateral: Secondary | ICD-10-CM

## 2018-11-30 DIAGNOSIS — H3581 Retinal edema: Secondary | ICD-10-CM | POA: Diagnosis not present

## 2018-11-30 DIAGNOSIS — E113393 Type 2 diabetes mellitus with moderate nonproliferative diabetic retinopathy without macular edema, bilateral: Secondary | ICD-10-CM | POA: Diagnosis not present

## 2018-11-30 DIAGNOSIS — H35033 Hypertensive retinopathy, bilateral: Secondary | ICD-10-CM | POA: Diagnosis not present

## 2018-11-30 DIAGNOSIS — I10 Essential (primary) hypertension: Secondary | ICD-10-CM | POA: Diagnosis not present

## 2018-11-30 DIAGNOSIS — H353232 Exudative age-related macular degeneration, bilateral, with inactive choroidal neovascularization: Secondary | ICD-10-CM | POA: Diagnosis not present

## 2018-11-30 DIAGNOSIS — H18519 Endothelial corneal dystrophy, unspecified eye: Secondary | ICD-10-CM

## 2018-11-30 NOTE — Progress Notes (Signed)
[facility] Note  11/30/2018     CHIEF COMPLAINT Patient presents for Retina Follow Up   HISTORY OF PRESENT ILLNESS: Tara Jackson is a 78 y.o. female who presents to the clinic today for:   HPI    Retina Follow Up    Patient presents with  Wet AMD.  In both eyes.  This started months ago.  Severity is moderate.  Duration of 9 months.  Since onset it is stable.  I, the attending physician,  performed the HPI with the patient and updated documentation appropriately.          Comments    78 y/o female pt here for 9 mo f/u for exu ARMD w/PED OU.  No change in overall VA OU, but has noticed having horizontal diplopia, only while laying on her side watching tv since suffering a bad fall about 2 wks ago and hitting the back of her head fairly hard.  Denies pain, flashes, floaters.  No gtts.  Recently had BS and A1C taken, but doesn't yet know results.       Last edited by Bernarda Caffey, MD on 12/02/2018 12:42 AM. (History)     Pt states she has vertigo and has had several falls recently, she states the last couple of falls were pretty hard, pt states she is seeing double vision when she lays down on her side and watches TV  Referring physician: Monna Fam, MD [address],  [place] [address]  HISTORICAL INFORMATION:   Selected notes from the MEDICAL RECORD NUMBER Referred by Dr. Monna Fam for concern of unusual large drusenoid, epi detachement LEE: 11.11.19 (K. Hecker) [BCVA: OD: 20/25++ OS: 20/20--] Ocular Hx-cataracts OU, glaucoma OU PMH-anemia, anxiety, arthritis, asthma, CKD, CAD, depression, DM(takes Novolin and glipizide), HLD, HTN,stroke    CURRENT MEDICATIONS: No current outpatient medications on file. (Ophthalmic Drugs)   No current facility-administered medications for this visit.  (Ophthalmic Drugs)   Current Outpatient Medications (Other)  Medication Sig  . acetaminophen (TYLENOL) 500 MG tablet Take 2 tablets  (1,000 mg total) by mouth every 6 (six) hours as needed.  Marland Kitchen albuterol (PROVENTIL HFA;VENTOLIN HFA) 108 (90 BASE) MCG/ACT inhaler Inhale 2 puffs into the lungs every 6 (six) hours as needed for wheezing.  Marland Kitchen amoxicillin (AMOXIL) 875 MG tablet Take 875 mg by mouth 2 (two) times daily.  . benzonatate (TESSALON) 100 MG capsule Take 100 mg by mouth 3 (three) times daily as needed for cough.  . bisacodyl (FLEET) 10 MG/30ML ENEM Place 10 mg rectally once.  . budesonide-formoterol (SYMBICORT) 80-4.5 MCG/ACT inhaler Take 2 puffs first thing in am and then another 2 puffs about 12 hours later.  . budesonide-formoterol (SYMBICORT) 80-4.5 MCG/ACT inhaler Inhale 2 puffs into the lungs 2 (two) times daily.  . busPIRone (BUSPAR) 10 MG tablet Take 1 tablet (10 mg total) by mouth every morning.  . Cholecalciferol (VITAMIN D-3 PO) Take 2 tablets by mouth every morning.   . clonazePAM (KLONOPIN) 0.5 MG tablet Take 0.5-1 mg by mouth See admin instructions. Takes 2 tabs in am and 1 tab in pm.  . clopidogrel (PLAVIX) 75 MG tablet Take 75 mg by mouth every morning.  . cyanocobalamin 100 MCG tablet Take 100 mcg by mouth daily.  Marland Kitchen dicyclomine (BENTYL) 10 MG capsule Take 1 capsule (10 mg total) by mouth 3 (three) times daily before meals.  Marland Kitchen DM-APAP-CPM (VICKS FORMULA 44 COUGH/COLD PM PO) Take by mouth. Used 1/2 cup  .  donepezil (ARICEPT) 10 MG tablet TAKE 1 TABLET BY MOUTH EVERYDAY AT BEDTIME  . esomeprazole (NEXIUM) 40 MG capsule Take 30-60 min before first meal of the day  . famotidine (PEPCID) 20 MG tablet One after supper  . glipiZIDE (GLUCOTROL) 10 MG tablet Take 20 mg by mouth 2 (two) times daily before a meal.   . guaiFENesin (MUCINEX) 600 MG 12 hr tablet Take 600 mg by mouth 2 (two) times daily as needed for cough.  . insulin NPH (HUMULIN N,NOVOLIN N) 100 UNIT/ML injection Inject 0-20 Units into the skin See admin instructions. Novolin-N Give 20 unit in morning; give  5 units at 4pm; evening dose as needed.  If  less than 150 blood sugar= 0units.  If greater than 200 blood sugar= give 10units to decrease by 100 blood sugar  . insulin regular (NOVOLIN R,HUMULIN R) 100 units/mL injection Inject 0-25 Units into the skin See admin instructions. Give 25 unit in morning; give 20units at 4pm; evening dose as needed.  If less than 150 blood sugar= 0units.  If greater than 200 blood sugar= give 10units to decrease by 100 blood sugar  . levothyroxine (SYNTHROID, LEVOTHROID) 100 MCG tablet Take 88 mcg by mouth every morning.   . lidocaine (LIDODERM) 5 % Place 3 patches onto the skin daily as needed (pain). Remove & Discard patch within 12 hours or as directed by MD  . linaclotide (LINZESS) 290 MCG CAPS capsule Take 290 mcg by mouth daily before breakfast.  . LORazepam (ATIVAN) 0.5 MG tablet Take 0.5 mg by mouth daily as needed for anxiety.  . magnesium 30 MG tablet Take 30 mg by mouth daily.  . Menthol-Methyl Salicylate (MUSCLE RUB) 10-15 % CREA Apply 1 application topically as needed for muscle pain.  . midodrine (PROAMATINE) 2.5 MG tablet Take 1 tablet (2.5 mg total) by mouth 3 (three) times daily with meals. (Patient taking differently: Take 2.5 mg by mouth 3 (three) times daily with meals. As needed if blood pressure goes to low.)  . nortriptyline (PAMELOR) 50 MG capsule TAKE 2 CAPSULES (100MG ) AT BEDTIME (Patient taking differently: TAKE 1 CAPSULES (100MG ) AT two times daily)  . ondansetron (ZOFRAN) 4 MG tablet Take 4 mg by mouth as needed.  . OXYGEN Inhale 2 L into the lungs as needed. Used at overnight with a concentrator   . polyethylene glycol (MIRALAX / GLYCOLAX) packet Take 17 g by mouth daily as needed for mild constipation.   . potassium chloride SA (K-DUR,KLOR-CON) 20 MEQ tablet Take 1 tablet (20 mEq total) by mouth 2 (two) times daily.  . predniSONE (DELTASONE) 10 MG tablet Take  4 each am x 2 days,   2 each am x 2 days,  1 each am x 2 days and stop  . Pumpkin Seed-Soy Germ (AZO BLADDER CONTROL/GO-LESS  PO) Take by mouth daily as needed.  . rosuvastatin (CRESTOR) 20 MG tablet Take 20 mg by mouth daily.   . sodium chloride 0.9 % SOLN 100 mL with phenylephrine 10 MG/ML SOLN 0.2 mL   . solifenacin (VESICARE) 10 MG tablet Take 10 mg by mouth daily as needed (overactive bladder).   Marland Kitchen terconazole (TERAZOL 7) 0.4 % vaginal cream Place 1 applicator vaginally at bedtime as needed (for yeast infection).   . traMADol (ULTRAM) 50 MG tablet Take 1 tablet (50 mg total) by mouth every 12 (twelve) hours as needed for moderate pain.  . furosemide (LASIX) 20 MG tablet Take 1 tablet (20 mg total) by mouth daily  as needed for fluid or edema.   No current facility-administered medications for this visit.  (Other)      REVIEW OF SYSTEMS: ROS    Positive for: Neurological, Genitourinary, Endocrine, Cardiovascular, Eyes, Psychiatric   Negative for: Constitutional, Gastrointestinal, Skin, Musculoskeletal, HENT, Respiratory, Allergic/Imm, Heme/Lymph   Last edited by Matthew Folks, COA on 11/30/2018  1:53 PM. (History)       ALLERGIES Allergies  Allergen Reactions  . Iodine Other (See Comments)    ONLY IV--Makes unconscious  . Iohexol Other (See Comments)     Code: SOB, Desc: cardiac arrest w/ iv contrast, has never used 13 hr prep//alice calhoun, Onset Date: [id number]   . Metoclopramide Hcl Other (See Comments)    suicidal  . Sulfa Antibiotics Hives  . Lactose Intolerance (Gi) Diarrhea  . Lansoprazole Nausea And Vomiting    PAST MEDICAL HISTORY Past Medical History:  Diagnosis Date  . Anemia   . Anxiety   . Arthritis   . Asthma   . Brittle bone disease   . CKD (chronic kidney disease)   . Complication of anesthesia    hard to wake up after anesthesia, trouble turning head  . Coronary artery disease, non-occlusive 2014   a. minimal CAD by cath in 2014 with 40% RI stenosis. b. low-risk NST in 11/2014.  Marland Kitchen Depression   . Diabetes mellitus type 2, insulin dependent ([place])   . Diabetic  neuropathy ([place])   . Diabetic retinopathy ([place])    NPDR OU  . Diastolic dysfunction, left ventricle 11/30/14   EF 55%, grade 1 DD  . DVT (deep venous thrombosis) ([place])    "18 in RLE; 7 LLE prior to PE" (03/24/2015)  . Family history of adverse reaction to anesthesia    " the whole family is hard to wake up"  . Glaucoma   . H/O hiatal hernia   . Hyperlipidemia   . Hypertension associated with diabetes ([place])   . Hypertensive retinopathy    OU  . Hypothyroidism   . Kidney stones    "passed them"  . Macular degeneration    Exu OU  . Memory loss 06/03/2014  . On home oxygen therapy    "2L oxygen concentrator @ night"  . Osteoporosis   . Oxygen desaturation during sleep    wears 2 liters of oxygen at night   . Palpitations 35years ago   Cardionet monitor - revealed mostly normal sinus rhythm, sinus bradycardia with first-degree A-V block heart rates mostly in the 50s and 60s with some 70s. No arrhythmias, PVCs or PACs noted.  . PE (pulmonary thromboembolism) ([place]) x 3, last one was ~ 2003   history of recurrent RLE DVT with PE - last PE ~>13 yrs; Maintained on Plavix  . Pneumonia   . Presence of permanent cardiac pacemaker   . Restless legs syndrome   . Sleep apnea    cpap disontinued; test done 07/14/2008 ordered per  Byrum  . Spinal headache   . Spinal stenosis    L 3, L 4 and L 5, and C 1, C 2 and C3  . Stroke [facility]) 04-19-2013   tia and stroke. Weakness Rt hand  . 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 s/p [employer] PPM in 03/2015 by Dr. Lovena Le secondary to sinus node dysfunction.   Marland Kitchen TIA (transient ischemic attack) March 2014   Past Surgical History:  Procedure Laterality Date  . ABDOMINAL HYSTERECTOMY    . APPENDECTOMY    . BACK SURGERY  steroid inj  . CATARACT EXTRACTION Bilateral   . CATARACT EXTRACTION W/ INTRAOCULAR LENS  IMPLANT, BILATERAL Bilateral 2000s  . CHOLECYSTECTOMY    . EP IMPLANTABLE DEVICE N/A 03/24/2015   [facility] [employer] PPM, Dr. Lovena Le   . ESOPHAGOGASTRODUODENOSCOPY  08/09/2011   Procedure: ESOPHAGOGASTRODUODENOSCOPY (EGD);  Surgeon: Jeryl Columbia, MD;  Location: [facility];  Service: Endoscopy;  Laterality: N/A;  . ESOPHAGOGASTRODUODENOSCOPY (EGD) WITH PROPOFOL N/A 11/12/2013   Procedure: ESOPHAGOGASTRODUODENOSCOPY (EGD) WITH PROPOFOL;  Surgeon: Jeryl Columbia, MD;  Location: [facility]OSCOPY;  Service: Endoscopy;  Laterality: N/A;  . ESOPHAGOGASTRODUODENOSCOPY (EGD) WITH PROPOFOL N/A 05/05/2016   Procedure: ESOPHAGOGASTRODUODENOSCOPY (EGD) WITH PROPOFOL;  Surgeon: Clarene Essex, MD;  Location: [facility] ENDOSCOPY;  Service: Endoscopy;  Laterality: N/A;  . EYE SURGERY Bilateral    Cat Sx OU  . GLAUCOMA SURGERY Bilateral 2000s   "laser"  . HOT HEMOSTASIS  08/09/2011   Procedure: HOT HEMOSTASIS (ARGON PLASMA COAGULATION/BICAP);  Surgeon: Jeryl Columbia, MD;  Location: [facility];  Service: Endoscopy;  Laterality: N/A;  . HOT HEMOSTASIS N/A 11/12/2013   Procedure: HOT HEMOSTASIS (ARGON PLASMA COAGULATION/BICAP);  Surgeon: Jeryl Columbia, MD;  Location: [facility];  Service: Endoscopy;  Laterality: N/A;  . INSERT / REPLACE / REMOVE PACEMAKER  03/24/2015  . IRIDOTOMY / IRIDECTOMY Bilateral   . JOINT REPLACEMENT    . KNEE SURGERY Left done with 2nd left knee replacement   spacer bar placement  . LEFT HEART CATHETERIZATION WITH CORONARY ANGIOGRAM N/A 02/14/2012   Procedure: LEFT HEART CATHETERIZATION WITH CORONARY ANGIOGRAM;  Surgeon: Leonie Man, MD;  Location: [facility] CATH LAB;  Service: Cardiovascular:: no evidence of obstructive coronary disease ,low EDP with normal EF  . LEV DOPPLER  05/29/2012   RIGHT EXTREM. NORMAL VENOUS DUPLEX  . NM MYOCAR PERF WALL MOTION  01/26/2012; 11/2014   a. EF 79%,LV normal,ST SEGMENT CHANGE SUGGESTIVE OF ISCHEMIA.; LOW RISK, EF 60%. No ischemia or infarction. No wall motion abnormality   . PACEMAKER PLACEMENT    . REVISION TOTAL KNEE ARTHROPLASTY Left   . TONSILLECTOMY    . TOTAL KNEE ARTHROPLASTY Bilateral   .  TRANSTHORACIC ECHOCARDIOGRAM  11/2017   11/2017: EF 60-65%. Gr1 DD. PAP ~33 mmHg.  Mild TR. PPM leads in RA & RV.   Marland Kitchen TRANSTHORACIC ECHOCARDIOGRAM  2014; October 2016   a. EF 80-99%; normal diastolic pressures, no regional wall motion motion abnormalities;; b/ F 55-60%. Normal wall motion. GR 1 DD. No valve lesions  . TRIGGER FINGER RELEASE  11/22/2011   Procedure: RELEASE TRIGGER FINGER/A-1 PULLEY;  Surgeon: Meredith Pel, MD;  Location: [facility];  Service: Orthopedics;  Laterality: Left;  Left trigger thumb release  . TRIGGER FINGER RELEASE Right yrs ago    FAMILY HISTORY Family History  Problem Relation Age of Onset  . Cancer Mother   . Diabetes Mother   . Cancer - Prostate Father   . Diabetes Father   . Retinal detachment Son   . Diabetes Sister   . Diabetes Brother   . Diabetes Paternal Grandmother     SOCIAL HISTORY Social History   Tobacco Use  . Smoking status: Never Smoker  . Smokeless tobacco: Never Used  Substance Use Topics  . Alcohol use: No    Alcohol/week: 0.0 standard drinks  . Drug use: No         OPHTHALMIC EXAM:  Base Eye Exam    Visual Acuity (Snellen - Linear)      Right Left   Dist  [place] 20/30 20/70 -2   Dist ph [place] 20/25 NI       Tonometry (Tonopen, 1:55 PM)      Right Left   Pressure 13 13       Pupils      Dark Light Shape React APD   Right 3 2 Round Brisk None   Left 3 2 Round Brisk None       Visual Fields (Counting fingers)      Left Right    Full Full       Extraocular Movement      Right Left    Full, Ortho Full, Ortho       Neuro/Psych    Oriented x3: Yes   Mood/Affect: Normal       Dilation    Both eyes: 1.0% Mydriacyl, 2.5% Phenylephrine @ 1:56 PM        Slit Lamp and Fundus Exam    Slit Lamp Exam      Right Left   Lids/Lashes  Dermatochalasis - lower lid, Dermatochalasis - upper lid, Meibomian gland dysfunction Dermatochalasis - lower lid, Dermatochalasis - upper lid, Meibomian gland dysfunction    Conjunctiva/Sclera mild melanosis White and quiet   Cornea arcus, 1+ PEE, 2-3+ guttata, no edema arcus, well healed temporal cataract wound, 1+PEE, 2+ guttata   Anterior Chamber Deep and quiet Deep and quiet   Iris round, no NVI, moderately dilated Round, mild atrophy inferotemporal, mildly dilated   Lens PCIOL PC IOL   Vitreous Vitreous syneresis Vitreous syneresis       Fundus Exam      Right Left   Disc pink and sharp, central cupping, Thin inferior rim pink and sharp   C/D Ratio 0.7 0.6   Macula good foveal reflex, RPE motltling and clumping, few MA, PED, RPE atrophy, no heme large PED nasal macula, blunted foveal reflex, scattered MA   Vessels  Vascular attenuation Vascular attenuation, mildly tortuous.   Periphery attached attached        Refraction    Manifest Refraction      Sphere Cylinder Axis Dist VA   Right -0.50 +0.25 015 20/30-2   Left -1.75 +0.75 015 20/30-          IMAGING AND PROCEDURES  Imaging and Procedures for @TODAY @  OCT, Retina - OU - Both Eyes       Right Eye Quality was good. Central Foveal Thickness: 200. Progression has been stable. Findings include normal foveal contour, no IRF, no SRF, pigment epithelial detachment, epiretinal membrane, macular pucker, retinal drusen , outer retinal atrophy, vitreomacular adhesion  (stable).   Left Eye Quality was good. Central Foveal Thickness: 212. Progression has been stable. Findings include pigment epithelial detachment, normal foveal contour, no IRF, no SRF, retinal drusen  (large nasal PED -- no IRF/SRF -- stable from prior).   Notes **Images stored on drive**  Diagnosis / Impression:  OD: NFP, no IRF/SRF, +focal ORA and PED, +ERM with mild pucker--stable OS: large nasal PED without IRF/SRF--stable  Clinical management:  See below  Abbreviations: NFP - Normal foveal profile. CME - cystoid macular edema. PED - pigment epithelial detachment. IRF - intraretinal fluid. SRF - subretinal fluid. EZ -  ellipsoid zone. ERM - epiretinal membrane. ORA - outer retinal atrophy. ORT - outer retinal tubulation. SRHM - subretinal hyper-reflective material                 ASSESSMENT/PLAN:    ICD-10-CM   1. 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 Retinal edema  H35.81 OCT, Retina - OU - Both Eyes  4. 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 Essential hypertension  I10   6. Hypertensive retinopathy of both eyes  H35.033   7. Pseudophakia of both eyes  Z96.1   8. Fuchs' endothelial dystrophy  H18.519     1,2. 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 Mild nonproliferative diabetic retinopathy w/o DME, both eyes  - The incidence, risk factors for progression, natural history and treatment options for diabetic retinopathy were discussed with patient.     - The need for close monitoring of blood glucose, blood pressure, and serum lipids, avoiding cigarette or any type of tobacco, and the need for long term follow up was also discussed with patient.  - exam with scattered MA, IRH OU  - FA shows mild MA; no NV  - OCT without diabetic macular edema, both eyes   5,6. Hypertensive retinopathy  OU  - discussed importance of tight BP control  - monitor  7. Pseudophakia OU  - s/p CE/IOL  - beautiful surgeries, doing well  - monitor  8. Fuch's endothelial dystrophy  - mild guttata OU, no edema   Ophthalmic Meds Ordered this visit:  No orders of the defined types were placed in this encounter.      Return for f/u 6-9 months, PED OS, DFE, OCT.  There are no Patient Instructions on file for this visit.   Explained the diagnoses, plan, and follow up with the patient and they expressed understanding.  Patient expressed understanding of the importance of proper follow up care.   This document serves as a record of services personally performed by Gardiner Sleeper, MD, PhD. It was created on their behalf by Ernest Mallick, OA, an ophthalmic assistant. The creation of this record is the provider's dictation and/or activities during the visit.    Electronically signed by: Ernest Mallick, OA 10.23.2020 12:43 AM   Gardiner Sleeper, M.D., Ph.D. Diseases & Surgery of the Retina and Vitreous [facility]  I have reviewed the above documentation for accuracy and completeness, and I agree with the above. Gardiner Sleeper, M.D., Ph.D. 12/02/18 12:45 AM    Abbreviations: M myopia (nearsighted); A astigmatism; H hyperopia (farsighted); P presbyopia; Mrx spectacle prescription;  CTL contact lenses; OD right eye; OS left eye; OU both eyes  XT exotropia; ET esotropia; PEK punctate epithelial keratitis; PEE punctate epithelial erosions; DES dry eye syndrome; MGD meibomian gland dysfunction; ATs artificial tears; PFAT's preservative free artificial tears; [place] nuclear sclerotic cataract; PSC posterior subcapsular cataract; ERM epi-retinal membrane; PVD  posterior vitreous detachment; RD retinal detachment; DM diabetes mellitus; DR diabetic retinopathy; NPDR non-proliferative diabetic retinopathy; PDR proliferative diabetic retinopathy; CSME clinically significant macular edema;  DME diabetic macular edema; dbh dot blot hemorrhages; CWS cotton wool spot; POAG primary open angle glaucoma; C/D cup-to-disc ratio; HVF humphrey visual field; GVF goldmann visual field; OCT optical coherence tomography; IOP intraocular pressure; BRVO Branch retinal vein occlusion; CRVO central retinal vein occlusion; CRAO central retinal artery occlusion; BRAO branch retinal artery occlusion; RT retinal tear; SB scleral buckle; PPV pars plana vitrectomy; VH Vitreous hemorrhage; PRP panretinal laser photocoagulation; IVK intravitreal kenalog; VMT vitreomacular traction; MH Macular hole;  NVD neovascularization of the disc; NVE neovascularization elsewhere; AREDS age related eye disease study; ARMD age related macular degeneration; POAG primary open angle glaucoma; EBMD epithelial/anterior basement membrane dystrophy; ACIOL anterior chamber intraocular lens; IOL intraocular lens; PCIOL posterior chamber intraocular lens; Phaco/IOL phacoemulsification with intraocular lens placement; [place] photorefractive keratectomy; LASIK laser assisted in situ keratomileusis; HTN hypertension; DM diabetes mellitus; COPD chronic obstructive pulmonary disease

## 2018-12-02 ENCOUNTER — Encounter (INDEPENDENT_AMBULATORY_CARE_PROVIDER_SITE_OTHER): Payer: Self-pay | Admitting: Ophthalmology

## 2018-12-04 ENCOUNTER — Telehealth: Payer: Self-pay | Admitting: Internal Medicine

## 2018-12-04 ENCOUNTER — Ambulatory Visit (INDEPENDENT_AMBULATORY_CARE_PROVIDER_SITE_OTHER): Payer: Medicare HMO | Admitting: Internal Medicine

## 2018-12-04 ENCOUNTER — Encounter: Payer: Self-pay | Admitting: Internal Medicine

## 2018-12-04 ENCOUNTER — Other Ambulatory Visit: Payer: Self-pay

## 2018-12-04 DIAGNOSIS — J45991 Cough variant asthma: Secondary | ICD-10-CM | POA: Diagnosis not present

## 2018-12-04 MED ORDER — PREDNISONE 10 MG PO TABS: ORAL_TABLET | ORAL | 0 refills | Status: DC

## 2018-12-04 NOTE — Progress Notes (Signed)
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 Consult    DME-[employer] on O2@2L  at night. has been having alot of "colds"coug, sinus drainage since 06-11-2018 on average using rescue inh. 2 time a day 4 days a week.  Dyspnea:  Better p albuterol / better p prednisone  Cough: since may 4th worse than usual  assoc with pnds /clear  Mucus/ not noct but severe to  point of gag/vomit Sleep: on side bed is flat/ 2 pillows variably cough/sob  SABA use: some better albuterol  Already tried 3 abx/ tessalon no better per office notes  Note flare off gerd rx which prev took per Dr Ewing Schlein  rec rec Plan A = Automatic = dulera 100 Take 2 puffs first thing in am and then another 2 puffs about 12 hours later - do not refill if not helping Work on inhaler technique:  Plan B = Backup Only use your albuterol inhaler as a rescue medication  Nexium  40 mg   Take  30-60 min before first meal of the day and Pepcid (famotidine)  20 mg one after supper  until return to office - this is the best way to tell whether stomach acid is contributing to your problem.   GERD diet      09/28/18  History of Present Illness: Dyspnea:  Much better while on dulera / less need albuterol / worse since ran out of sample and can't afford dulera on her plan Cough: more hoarse with sense of PNDS  but no real cough  Sleeping: bothered by nose running  SABA use: twice daily  02: 2lpm hs  rec Plan A = Automatic =start symbicort 80 Take 2 puffs first thing in am and then  another 2 puffs about 12 hours later.  And take Prednisone 10 mg take  4 each am x 2 days,   2 each am x 2 days,  1 each am x 2 days and stop   Work on inhaler technique:  Plan B = Backup Only use your albuterol inhaler as a rescue medication     11/02/2018  Acute post ER f/u ov  ov/Aneth Schlagel re: cough variant asthma vs uacs  Chief Complaint  Patient presents with  . Sinus drainage, out of breath    Does not feel any better since the hospital visit on 10/30/18 for sinus infection.  Dyspnea:  Room to room holding onto wall - poor balance and strength Cough: assoc with rhinorrhea, watery, some pnds no resp to atrovent ns  Sleeping: bed is flat disturbed by nasal symptoms > cough wheeze or sob    SABA use: none at all  02: 2lpm hs sometimes  rec For drainage / throat tickle try take CHLORPHENIRAMINE  4 mg  (Chlortab 4mg   at [employer] should be easiest to find in the green box)  take one every 4 hours as needed - available over the counter- may cause drowsiness so  start with just a bedtime dose or two and see how you tolerate it before trying in daytime   Prednisone 10 mg take  4 each am x 2 days,   2 each am x 2 days,  1 each am x 2 days and stop  Plan A = Automatic = Always=    Symbicort 80 Take 2 puffs first thing in am and then another 2 puffs about 12 hours later.  Work on inhaler technique:   Plan B = Backup (to supplement plan A, not to replace it) Only use your albuterol inhaler as a rescue medication rec For drainage / throat tickle try take CHLORPHENIRAMINE  4 mg   Prednisone 10 mg take  4 each am x 2 days,   2 each am x 2 days,  1 each am x 2 days and stop  Plan A = Automatic = Always=    Symbicort 80 Take 2 puffs first thing in am and then another 2 puffs about 12 hours later.  Work on inhaler technique:     Plan B = Backup (to supplement plan A, not to replace it) Only use your albuterol inhaler as a rescue medication   Virtual Visit via Telephone Note 12/04/2018    I  connected with Jeanetta Terzo on 12/04/18 at  1310 by telephone and verified that I am speaking with the correct person using two identifiers.   I discussed the limitations, risks, security and privacy concerns of performing an evaluation and management service by telephone and the availability of in person appointments. I also discussed with the patient that there may be a patient responsible charge related to this service. The patient expressed understanding and agreed to proceed.   History of Present Illness:  Worse cough assoc with pnds only better with prednisone / did not get 1st gen H1 blockers per recs  Dyspnea:  Walking limited by balance  Cough: pnds esp hs  Sleeping: extra pillow drainage keeps her up  SABA use: symbicort and saba hs  02: 2lpm hs    No obvious day to day or daytime variability or assoc excess/ purulent sputum or mucus plugs or hemoptysis or cp or chest tightness, subjective wheeze or overt  hb symptoms.    Also denies any obvious fluctuation of symptoms with weather or environmental changes or other aggravating or alleviating factors except as outlined above.   Meds reviewed/ med reconciliation completed         Observations/Objective: Hoarse wf no increased wob    Assessment and Plan: See problem list for active a/p's   Follow Up Instructions: See avs for instructions unique to this ov which includes revised/ updated med list     I discussed the assessment and treatment plan with the patient. The patient was provided an opportunity to ask questions and all were answered. The patient agreed with the plan and demonstrated an understanding of the instructions.   The patient was advised to call back or seek an in-person evaluation if the symptoms worsen or if the condition fails to improve as anticipated.  I provided 25 minutes of non-face-to-face time during this encounter.   Sandrea Hughs, MD                Past Medical History:   Diagnosis Date  . Anemia   . Anxiety   . Arthritis   . Asthma   . Brittle bone disease   . Cataract   . CKD (chronic kidney disease)   .  Complication of anesthesia    hard to wake up after anesthesia, trouble turning head  . Coronary artery disease, non-occlusive 2014   a. minimal CAD by cath in 2014 with 40% RI stenosis. b. low-risk NST in 11/2014.  Marland Kitchen Depression   . Diabetes mellitus type 2, insulin dependent (HCC)   . Diabetic neuropathy (HCC)   . Diastolic dysfunction, left ventricle 11/30/14   EF 55%, grade 1 DD  . DVT (deep venous thrombosis) (HCC)    "18 in RLE; 7 LLE prior to PE" (03/24/2015)  . Family history of adverse reaction to anesthesia    " the whole family is hard to wake up"  . Glaucoma   . H/O hiatal hernia   . Hyperlipidemia   . Hypertension associated with diabetes (HCC)   . Hypothyroidism   . Kidney stones    "passed them"  . Memory loss 06/03/2014  . On home oxygen therapy    "2L oxygen concentrator @ night"  . Osteoporosis   . Oxygen desaturation during sleep    wears 2 liters of oxygen at night   . Palpitations 35years ago   Cardionet monitor - revealed mostly normal sinus rhythm, sinus bradycardia with first-degree A-V block heart rates mostly in the 50s and 60s with some 70s. No arrhythmias, PVCs or PACs noted.  . PE (pulmonary thromboembolism) (HCC) x 3, last one was ~ 2003   history of recurrent RLE DVT with PE - last PE ~>13 yrs; Maintained on Plavix  . Pneumonia   . Presence of permanent cardiac pacemaker   . Restless legs syndrome   . Sleep apnea    cpap disontinued; test done 07/14/2008 ordered per  Byrum  . Spinal headache   . Spinal stenosis    L 3, L 4 and L 5, and C 1, C 2 and C3  . Stroke [facility]) 04-19-2013   tia and stroke. Weakness Rt hand  . 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 s/p [employer] PPM in 03/2015 by Dr. Ladona Ridgel secondary to sinus node dysfunction.   Marland Kitchen TIA (transient ischemic attack) March 2014

## 2018-12-04 NOTE — Telephone Encounter (Signed)
ATC, NA and no option to leave msg    Instructions  Prednisone 10 mg  2 each am until 100% better then one daily  X one week and then one half daily until seen    For drainage / throat tickle try take CHLORPHENIRAMINE  4 mg  (Chlortab 4mg   at [employer] should be easiest to find in the green box)  take one every 4 hours as needed - available over the counter- may cause drowsiness so start with just a bedtime dose or two and see how you tolerate it before trying in daytime      Please schedule a follow up office visit in 2 weeks, sooner if needed  with all medications /inhalers/ solutions in hand so we can verify exactly what you are taking. This includes all medications from all doctors and over the counters     Pt has my chart

## 2018-12-04 NOTE — Assessment & Plan Note (Addendum)
Onset spring 2019  - CT sinus 12/21/17 wnl (as seen on head ct) - Allergy profile 07/25/2018 >  Eos 0.1 /  IgE 15 RAST neg   - 07/25/2018   try dulera 100 2bid x 2 weeks > helped but could not afford it  - 11/02/2018  After extensive coaching inhaler device,  effectiveness =    75% but no consistently at this level > symbicort 80 2bid with samples  - 12/04/2018 Prednisone 10 mg  2 each am until 100% better then one daily  X one week and then one half daily until seen   Clearly jmuych better on prednisone and worse off with documented poor hfa  The goal with a chronic steroid dependent illness is always arriving at the lowest effective dose that controls the disease/symptoms and not accepting a set "formula" which is based on statistics or guidelines that don't always take into account patient  variability or the natural hx of the dz in every individual patient, which may well vary over time.  For now therefore I recommend the patient maintain  Prednisone 10 mg  2 each am until 100% better then one daily  X one week and then one half daily until seen  And return with all meds in hand using a trust but verify approach to confirm accurate Medication  Reconciliation The principal here is that until we are certain that the  patients are doing what we've asked, it makes no sense to ask them to do more.     Each maintenance medication was reviewed in detail including most importantly the difference between maintenance and as needed and under what circumstances the prns are to be used.  Please see AVS for specific  Instructions which are unique to this visit and I personally typed out  which were reviewed in detail over the phone with the patient and a copy provided via my chart

## 2018-12-04 NOTE — Patient Instructions (Addendum)
Prednisone 10 mg  2 each am until 100% better then one daily  X one week and then one half daily until seen   For drainage / throat tickle try take CHLORPHENIRAMINE  4 mg  (Chlortab 4mg   at [employer] should be easiest to find in the green box)  take one every 4 hours as needed - available over the counter- may cause drowsiness so start with just a bedtime dose or two and see how you tolerate it before trying in daytime    Please schedule a follow up office visit in 2 weeks, sooner if needed  with all medications /inhalers/ solutions in hand so we can verify exactly what you are taking. This includes all medications from all doctors and over the counters   Pt has my chart

## 2018-12-05 DIAGNOSIS — E118 Type 2 diabetes mellitus with unspecified complications: Secondary | ICD-10-CM | POA: Diagnosis not present

## 2018-12-05 DIAGNOSIS — Z794 Long term (current) use of insulin: Secondary | ICD-10-CM | POA: Diagnosis not present

## 2018-12-05 NOTE — Telephone Encounter (Signed)
Pt called back - gave her name of the OTC drug - nothing else needed-pr

## 2018-12-13 DIAGNOSIS — Z9981 Dependence on supplemental oxygen: Secondary | ICD-10-CM | POA: Diagnosis not present

## 2018-12-13 DIAGNOSIS — R05 Cough: Secondary | ICD-10-CM | POA: Diagnosis not present

## 2018-12-13 DIAGNOSIS — Z794 Long term (current) use of insulin: Secondary | ICD-10-CM | POA: Diagnosis not present

## 2018-12-13 DIAGNOSIS — R69 Illness, unspecified: Secondary | ICD-10-CM | POA: Diagnosis not present

## 2018-12-13 DIAGNOSIS — N3281 Overactive bladder: Secondary | ICD-10-CM | POA: Diagnosis not present

## 2018-12-13 DIAGNOSIS — I129 Hypertensive chronic kidney disease with stage 1 through stage 4 chronic kidney disease, or unspecified chronic kidney disease: Secondary | ICD-10-CM | POA: Diagnosis not present

## 2018-12-13 DIAGNOSIS — E039 Hypothyroidism, unspecified: Secondary | ICD-10-CM | POA: Diagnosis not present

## 2018-12-13 DIAGNOSIS — E1140 Type 2 diabetes mellitus with diabetic neuropathy, unspecified: Secondary | ICD-10-CM | POA: Diagnosis not present

## 2018-12-13 DIAGNOSIS — R413 Other amnesia: Secondary | ICD-10-CM | POA: Diagnosis not present

## 2018-12-18 ENCOUNTER — Encounter: Payer: Self-pay | Admitting: Internal Medicine

## 2018-12-18 ENCOUNTER — Ambulatory Visit: Payer: Medicare HMO | Admitting: Internal Medicine

## 2018-12-18 ENCOUNTER — Other Ambulatory Visit: Payer: Self-pay

## 2018-12-18 VITALS — BP 112/60 | HR 60 | Temp 97.0°F | Ht 67.0 in | Wt 259.0 lb

## 2018-12-18 DIAGNOSIS — J45991 Cough variant asthma: Secondary | ICD-10-CM | POA: Diagnosis not present

## 2018-12-18 MED ORDER — FORMOTEROL FUMARATE 20 MCG/2ML IN NEBU: 20.0000 ug | INHALATION_SOLUTION | Freq: Two times a day (BID) | RESPIRATORY_TRACT | 11 refills | Status: DC

## 2018-12-18 MED ORDER — BUDESONIDE 0.25 MG/2ML IN SUSP: RESPIRATORY_TRACT | 12 refills | Status: DC

## 2018-12-18 NOTE — Progress Notes (Signed)
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 Consult    DME-[facility] on O2@2L  at night. has been having alot of "colds"coug, sinus drainage since 06-11-2018 on average using rescue inh. 2 time a day 4 days a week.  Dyspnea:  Better p albuterol / better p prednisone  Cough: since may 4th worse than usual  assoc with pnds /clear  Mucus/ not noct but severe to  point of gag/vomit Sleep: on side bed is flat/ 2 pillows variably cough/sob  SABA use: some better albuterol  Already tried 3 abx/ tessalon no better per office notes  Note flare off gerd rx which prev took per Dr Ewing Schlein  rec rec Plan A = Automatic = dulera 100 Take 2 puffs first thing in am and then another 2 puffs about 12 hours later - do not refill if not helping Work on inhaler technique:  Plan B = Backup Only use your albuterol inhaler as a rescue medication  Nexium  40 mg   Take  30-60 min before first meal of the day and Pepcid (famotidine)  20 mg one after supper  until return to office - this is the best way to tell whether stomach acid is contributing to your problem.   GERD diet      09/28/18  History of Present Illness: Dyspnea:  Much better while on dulera / less need albuterol / worse since ran out of sample and can't afford dulera on her plan Cough: more hoarse with sense of PNDS  but no real cough  Sleeping: bothered by nose running  SABA use: twice daily  02: 2lpm hs  rec Plan A = Automatic =start symbicort 80 Take 2 puffs first thing in am and then another 2  puffs about 12 hours later.  And take Prednisone 10 mg take  4 each am x 2 days,   2 each am x 2 days,  1 each am x 2 days and stop   Work on inhaler technique:  Plan B = Backup Only use your albuterol inhaler as a rescue medication     11/02/2018  Acute post ER f/u ov  ov/Tara Jackson re: cough variant asthma+/-  uacs Chief Complaint  Patient presents with  . Sinus drainage, out of breath    Does not feel any better since the hospital visit on 10/30/18 for sinus infection.  Dyspnea:  Room to room holding onto wall - poor balance and strength Cough: assoc with rhinorrhea, watery, some pnds no resp to atrovent ns  Sleeping: bed is flat disturbed by nasal symptoms > cough wheeze or sob    SABA use: none at all  02: 2lpm hs sometimes  rec For drainage / throat tickle try take CHLORPHENIRAMINE  4 mg  (Chlortab 4mg   at [employer] should be easiest to find in the green box)  take one every 4 hours as needed - available over the counter- may cause drowsiness so  start with just a bedtime dose or two and see how you tolerate it before trying in daytime   Prednisone 10 mg take  4 each am x 2 days,   2 each am x 2 days,  1 each am x 2 days and stop  Plan A = Automatic = Always=    Symbicort 80 Take 2 puffs first thing in am and then another 2 puffs about 12 hours later.  Work on inhaler technique:   Plan B = Backup (to supplement plan A, not to replace it)   televisit 12/04/18 recs Prednisone 10 mg  2 each am until 100% better then one daily  X one week and then one half daily until seen  For drainage / throat tickle try take CHLORPHENIRAMINE  4 mg  (Chlortab 4mg   at [employer] should be easiest to find in the green box)  take one every 4 hours as needed -     12/18/2018  f/u ov/Tara Jackson re: cough variant asthma  Vs uacs  improves on pred only and can't use hfa (exhales ever time)  Chief Complaint  Patient presents with  . Follow-up    Breathing has improved some.   Dyspnea:  Room to room   Baseline holds on to wall / balance and strength issues  Cough: not much still hoarse  Sleeping: bed is flat / two pillows baseline SABA uses too much and not effective technique  02: 2lpm sometimes    No obvious day to day or daytime variability or assoc excess/ purulent sputum or mucus plugs or hemoptysis or cp or chest tightness, subjective wheeze or overt sinus or hb symptoms.   Sleeping as above bette  without nocturnal  or early am exacerbation  of respiratory  c/o's or need for noct saba. Also denies any obvious fluctuation of symptoms with weather or environmental changes or other aggravating or alleviating factors except as outlined above   No unusual exposure hx or h/o childhood pna/ asthma or knowledge of premature birth.  Current Allergies, Complete Past Medical History, Past Surgical History, Family History, and Social History were reviewed in [employer] [facility] record.  ROS  The following are not active complaints unless bolded Hoarseness, sore throat, dysphagia, dental problems, itching, sneezing,  nasal congestion or discharge of excess mucus or purulent secretions, ear ache,   fever, chills, sweats, unintended wt loss or wt gain, classically pleuritic or exertional cp,  orthopnea pnd or arm/hand swelling  or leg ankle swelling, presyncope, palpitations, abdominal pain, anorexia, nausea, vomiting, diarrhea  or change in bowel habits or change in bladder habits, change in stools or change in urine, dysuria, hematuria,  rash, arthralgias, visual complaints, headache, numbness, weakness or ataxia or problems with walking or coordination,  change in mood or  memory.        Current Meds  Medication Sig  . acetaminophen (TYLENOL) 650 MG CR tablet Take 650 mg by mouth every 8 (eight) hours as needed for pain.  Marland Kitchen albuterol (PROVENTIL HFA;VENTOLIN HFA) 108 (90 BASE) MCG/ACT inhaler Inhale 2 puffs into the lungs every 6 (six) hours as needed for wheezing.  . benzonatate  (TESSALON) 100 MG capsule Take 100 mg by mouth 3 (three) times daily as needed for cough.  . Bisacodyl (LAXATIVE PO) Take by mouth as needed.  . budesonide-formoterol (SYMBICORT) 80-4.5 MCG/ACT inhaler Take 2 puffs first thing in am and then another 2 puffs about 12 hours later.  . busPIRone (BUSPAR) 10 MG tablet Take  1 tablet (10 mg total) by mouth every morning.  . chlorpheniramine (CHLOR-TRIMETON) 4 MG tablet Take 4 mg by mouth every 4 (four) hours as needed for allergies.  . Cholecalciferol (VITAMIN D-3 PO) Take 2 tablets by mouth every morning.   . clonazePAM (KLONOPIN) 0.5 MG tablet Take 0.5-1 mg by mouth See admin instructions. Takes 2 tabs in am and 1 tab in pm.  . clopidogrel (PLAVIX) 75 MG tablet Take 75 mg by mouth every morning.  . cyanocobalamin 100 MCG tablet Take 100 mcg by mouth daily.  Marland Kitchen dicyclomine (BENTYL) 10 MG capsule Take 1 capsule (10 mg total) by mouth 3 (three) times daily before meals.  . donepezil (ARICEPT) 10 MG tablet TAKE 1 TABLET BY MOUTH EVERYDAY AT BEDTIME  . esomeprazole (NEXIUM) 40 MG capsule Take 30-60 min before first meal of the day  . famotidine (PEPCID) 20 MG tablet One after supper  . glipiZIDE (GLUCOTROL) 10 MG tablet Take 20 mg by mouth 2 (two) times daily before a meal.   . guaiFENesin (MUCINEX) 600 MG 12 hr tablet Take 600 mg by mouth 2 (two) times daily as needed for cough.  . insulin NPH (HUMULIN N,NOVOLIN N) 100 UNIT/ML injection Inject 0-20 Units into the skin See admin instructions. Novolin-N Give 20 unit in morning; give  5 units at 4pm; evening dose as needed.  If less than 150 blood sugar= 0units.  If greater than 200 blood sugar= give 10units to decrease by 100 blood sugar  . insulin regular (NOVOLIN R,HUMULIN R) 100 units/mL injection Inject 0-25 Units into the skin See admin instructions. Give 25 unit in morning; give 20units at 4pm; evening dose as needed.  If less than 150 blood sugar= 0units.  If greater than 200 blood sugar= give 10units to  decrease by 100 blood sugar  . levothyroxine (SYNTHROID, LEVOTHROID) 100 MCG tablet Take 88 mcg by mouth every morning.   . lidocaine (LIDODERM) 5 % Place 3 patches onto the skin daily as needed (pain). Remove & Discard patch within 12 hours or as directed by MD  . linaclotide (LINZESS) 290 MCG CAPS capsule Take 290 mcg by mouth daily before breakfast.  . LORazepam (ATIVAN) 0.5 MG tablet Take 0.5 mg by mouth daily as needed for anxiety.  . magnesium 30 MG tablet Take 30 mg by mouth daily.  . Menthol-Methyl Salicylate (MUSCLE RUB) 10-15 % CREA Apply 1 application topically as needed for muscle pain.  . midodrine (PROAMATINE) 2.5 MG tablet Take 1 tablet (2.5 mg total) by mouth 3 (three) times daily with meals. (Patient taking differently: Take 2.5 mg by mouth 3 (three) times daily with meals. As needed if blood pressure goes to low.)  . nortriptyline (PAMELOR) 50 MG capsule TAKE 2 CAPSULES (100MG ) AT BEDTIME (Patient taking differently: TAKE 1 CAPSULES (100MG ) AT two times daily)  . ondansetron (ZOFRAN) 4 MG tablet Take 4 mg by mouth as needed.  . OXYGEN Inhale 2 L into the lungs as needed. Used at overnight with a concentrator   . polyethylene glycol (MIRALAX / GLYCOLAX) packet Take 17 g by mouth daily as needed for mild constipation.   . potassium chloride SA (K-DUR,KLOR-CON) 20 MEQ tablet Take 1 tablet (20 mEq total) by mouth 2 (two) times daily.  . predniSONE (DELTASONE) 10 MG tablet Prednisone 10 mg  2 each am until 100% better then one daily  X one week and then one half daily until seen  . Probiotic Product (ALIGN) 4 MG CAPS Take 1  capsule by mouth daily.  . rosuvastatin (CRESTOR) 20 MG tablet Take 20 mg by mouth daily.   . sodium chloride 0.9 % SOLN 100 mL with phenylephrine 10 MG/ML SOLN 0.2 mL   . solifenacin (VESICARE) 10 MG tablet Take 10 mg by mouth daily as needed (overactive bladder).                 Past Medical History:  Diagnosis Date  . Anemia   . Anxiety   . Arthritis    . Asthma   . Brittle bone disease   . Cataract   . CKD (chronic kidney disease)   . Complication of anesthesia    hard to wake up after anesthesia, trouble turning head  . Coronary artery disease, non-occlusive 2014   a. minimal CAD by cath in 2014 with 40% RI stenosis. b. low-risk NST in 11/2014.  Marland Kitchen Depression   . Diabetes mellitus type 2, insulin dependent (HCC)   . Diabetic neuropathy (HCC)   . Diastolic dysfunction, left ventricle 11/30/14   EF 55%, grade 1 DD  . DVT (deep venous thrombosis) (HCC)    "18 in RLE; 7 LLE prior to PE" (03/24/2015)  . Family history of adverse reaction to anesthesia    " the whole family is hard to wake up"  . Glaucoma   . H/O hiatal hernia   . Hyperlipidemia   . Hypertension associated with diabetes (HCC)   . Hypothyroidism   . Kidney stones    "passed them"  . Memory loss 06/03/2014  . On home oxygen therapy    "2L oxygen concentrator @ night"  . Osteoporosis   . Oxygen desaturation during sleep    wears 2 liters of oxygen at night   . Palpitations 35years ago   Cardionet monitor - revealed mostly normal sinus rhythm, sinus bradycardia with first-degree A-V block heart rates mostly in the 50s and 60s with some 70s. No arrhythmias, PVCs or PACs noted.  . PE (pulmonary thromboembolism) (HCC) x 3, last one was ~ 2003   history of recurrent RLE DVT with PE - last PE ~>13 yrs; Maintained on Plavix  . Pneumonia   . Presence of permanent cardiac pacemaker   . Restless legs syndrome   . Sleep apnea    cpap disontinued; test done 07/14/2008 ordered per  Byrum  . Spinal headache   . Spinal stenosis    L 3, L 4 and L 5, and C 1, C 2 and C3  . Stroke [facility]) 04-19-2013   tia and stroke. Weakness Rt hand  . 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 s/p [employer] PPM in 03/2015 by Dr. Ladona Ridgel secondary to sinus node dysfunction.   Marland Kitchen TIA (transient ischemic attack) March 2014        Objective:    12/18/2018     259   11/02/18 265 lb 3.2 oz  (120.3 kg)  10/30/18 258 lb 2.5 oz (117.1 kg)  07/25/18 258 lb 3.2 oz (117.1 kg)     BP 112/60 (BP Location: Left Arm, Cuff Size: Normal)   Pulse 60   Temp (!) 97 F (36.1 C) (Temporal)   Ht 5\' 7"  (1.702 m)   Wt 259 lb (117.5 kg)   SpO2 98% Comment: on RA  BMI 40.57 kg/m    W/c bound MO (by bmi) bf nad   Reports:Full dentures   HEENT : pt wearing mask not removed for exam due to covid -19 concerns.    NECK :  without JVD/Nodes/TM/ nl carotid upstrokes bilaterally   LUNGS: no acc muscle use,  Nl contour chest which is clear to A and P bilaterally without cough on insp or exp maneuvers   CV:  RRR  no s3 or murmur or increase in P2, and   trace L > R ankle pitting edema    ABD: quite obese/  soft and nontender with nl inspiratory excursion in the supine position. No bruits or organomegaly appreciated, bowel sounds nl  MS:   ext warm without deformities, calf tenderness, cyanosis or clubbing No obvious joint restrictions   SKIN: warm and dry without lesions    NEURO:  alert, approp, nl sensorium with  no motor or cerebellar deficits apparent.         I personally reviewed images and agree with radiology impression as follows:  CXR:   10/30/2018 No acute cardiopulmonary findings.   Assessment

## 2018-12-18 NOTE — Assessment & Plan Note (Signed)
Onset spring 2019  - CT sinus 12/21/17 wnl (as seen on head ct) - Allergy profile 07/25/2018 >  Eos 0.1 /  IgE 15 RAST neg   - 07/25/2018   try dulera 100 2bid x 2 weeks > helped but could not afford it  - 11/02/2018  After extensive coaching inhaler device,  effectiveness =    75% but no consistently at this level > symbicort 80 2bid with samples  - 12/04/2018 Prednisone 10 mg  2 each am until 100% better then one daily  X one week and then one half daily until seen  - 12/18/2018  After extensive coaching inhaler device,  effectiveness =    0% (exhales repeatedly)  DDX of  difficult airways management almost all start with A and  include Adherence, Ace Inhibitors, Acid Reflux, Active Sinus Disease, Alpha 1 Antitripsin deficiency, Anxiety masquerading as Airways dz,  ABPA,  Allergy(esp in young), Aspiration (esp in elderly), Adverse effects of meds,  Active smoking or vaping, A bunch of PE's (a small clot burden can't cause this syndrome unless there is already severe underlying pulm or vascular dz with poor reserve) plus two Bs  = Bronchiectasis and Beta blocker use..and one C= CHF   Adherence is always the initial "prime suspect" and is a multilayered concern that requires a "trust but verify" approach in every patient - starting with knowing how to use medications, especially inhalers, correctly, keeping up with refills and understanding the fundamental difference between maintenance and prns vs those medications only taken for a very short course and then stopped and not refilled.  - see hfa teaching > need to change to neb   ? Allergy >  The goal with a chronic steroid dependent illness is always arriving at the lowest effective dose that controls the disease/symptoms and not accepting a set "formula" which is based on statistics or guidelines that don't always take into account patient  variability or the natural hx of the dz in every individual patient, which may well vary over time.  For now  therefore I recommend the patient maintain  Ceiling of 20 mg and floor of 5 mg daily until seen   ? Acid (or non-acid) GERD > always difficult to exclude as up to 75% of pts in some series report no assoc GI/ Heartburn symptoms> rec continue max (24h)  acid suppression and diet restrictions/ reviewed      ? Anxiety/depression/ cognitive decline  > usually at the bottom of this list of usual suspects but should be   on this pt's based on H and P and note already on psychotropics and may interfere with adherence and also interpretation of response or lack thereof to symptom management which can be quite subjective.   ? chf / cardiac asthma . Able to lie flat ok, cxr ok    >>>>  Change to neb laba/ics and f/u in 4 weeks, sooner if needed   I had an extended discussion with the patient reviewing all relevant studies completed to date and  lasting 15 to 20 minutes of a 25 minute visit    I performed detailed device teaching using a teach back method which extended face to face time for this visit (see above)  Each maintenance medication was reviewed in detail including emphasizing most importantly the difference between maintenance and prns and under what circumstances the prns are to be triggered using an action plan format that is not reflected in the computer generated alphabetically organized AVS  which I have not found useful in most complex patients, especially with respiratory illnesses  Please see AVS for specific instructions unique to this visit that I personally wrote and verbalized to the the pt in detail and then reviewed with pt  by my nurse highlighting any  changes in therapy recommended at today's visit to their plan of care.

## 2018-12-18 NOTE — Patient Instructions (Signed)
When breathing is bad start back at Prednisone 10 mg  2 each am until 100% better then one daily  X one week and then one half daily threfore    For drainage / throat tickle try take CHLORPHENIRAMINE  4 mg  (Chlortab 4mg   at [employer] should be easiest to find in the green box)  take one every 4 hours as needed - available over the counter- may cause drowsiness so start with just a bedtime dose or two and see how you tolerate it before trying in daytime    New maintenance therapy = Automatic = Always = nebulizer with budesonide and performist first thing in am and 12 hours later.   Please schedule a follow up office visit in 4 weeks, sooner if needed

## 2018-12-19 ENCOUNTER — Other Ambulatory Visit: Payer: Self-pay | Admitting: Internal Medicine

## 2018-12-19 DIAGNOSIS — G4452 New daily persistent headache (NDPH): Secondary | ICD-10-CM

## 2018-12-19 DIAGNOSIS — J449 Chronic obstructive pulmonary disease, unspecified: Secondary | ICD-10-CM | POA: Diagnosis not present

## 2018-12-20 ENCOUNTER — Ambulatory Visit (INDEPENDENT_AMBULATORY_CARE_PROVIDER_SITE_OTHER): Payer: Medicare HMO | Admitting: *Deleted

## 2018-12-20 DIAGNOSIS — I5032 Chronic diastolic (congestive) heart failure: Secondary | ICD-10-CM

## 2018-12-20 DIAGNOSIS — I495 Sick sinus syndrome: Secondary | ICD-10-CM | POA: Diagnosis not present

## 2018-12-20 LAB — CUP PACEART REMOTE DEVICE CHECK
Battery Remaining Longevity: 126 mo
Battery Remaining Percentage: 100 %
Brady Statistic RA Percent Paced: 83 %
Brady Statistic RV Percent Paced: 63 %
Date Time Interrogation Session: 20201112111800
Implantable Lead Implant Date: 20170214
Implantable Lead Implant Date: 20170214
Implantable Lead Location: 753859
Implantable Lead Location: 753860
Implantable Lead Model: 7740
Implantable Lead Model: 7741
Implantable Lead Serial Number: 649859
Implantable Lead Serial Number: 728741
Implantable Pulse Generator Implant Date: 20170214
Lead Channel Impedance Value: 698 Ohm
Lead Channel Impedance Value: 761 Ohm
Lead Channel Pacing Threshold Amplitude: 0.5 V
Lead Channel Pacing Threshold Amplitude: 1.3 V
Lead Channel Pacing Threshold Pulse Width: 0.4 ms
Lead Channel Pacing Threshold Pulse Width: 0.4 ms
Lead Channel Setting Pacing Amplitude: 1.9 V
Lead Channel Setting Pacing Amplitude: 2 V
Lead Channel Setting Pacing Pulse Width: 0.4 ms
Lead Channel Setting Sensing Sensitivity: 2.5 mV
Pulse Gen Serial Number: 718098

## 2018-12-21 ENCOUNTER — Telehealth: Payer: Self-pay | Admitting: Internal Medicine

## 2018-12-21 DIAGNOSIS — J449 Chronic obstructive pulmonary disease, unspecified: Secondary | ICD-10-CM | POA: Diagnosis not present

## 2018-12-21 DIAGNOSIS — J45909 Unspecified asthma, uncomplicated: Secondary | ICD-10-CM | POA: Diagnosis not present

## 2018-12-21 DIAGNOSIS — R69 Illness, unspecified: Secondary | ICD-10-CM | POA: Diagnosis not present

## 2018-12-21 MED ORDER — BUDESONIDE 0.25 MG/2ML IN SUSP: RESPIRATORY_TRACT | 12 refills | Status: DC

## 2018-12-21 MED ORDER — FORMOTEROL FUMARATE 20 MCG/2ML IN NEBU: 20.0000 ug | INHALATION_SOLUTION | Freq: Two times a day (BID) | RESPIRATORY_TRACT | 11 refills | Status: DC

## 2018-12-21 NOTE — Telephone Encounter (Signed)
Called and spoke with pt. Pt said when [facility] came to bring neb machine, they told her the sol needed to come from her local pharmacy.stated to pt that I would send Rx for both meds to pharmacy for her and pt verbalized understanding. Nothing further needed.

## 2018-12-23 DIAGNOSIS — G4733 Obstructive sleep apnea (adult) (pediatric): Secondary | ICD-10-CM | POA: Diagnosis not present

## 2018-12-23 DIAGNOSIS — I509 Heart failure, unspecified: Secondary | ICD-10-CM | POA: Diagnosis not present

## 2018-12-23 DIAGNOSIS — I10 Essential (primary) hypertension: Secondary | ICD-10-CM | POA: Diagnosis not present

## 2018-12-24 DIAGNOSIS — R69 Illness, unspecified: Secondary | ICD-10-CM | POA: Diagnosis not present

## 2019-01-11 NOTE — Progress Notes (Signed)
Remote pacemaker transmission.   

## 2019-01-15 ENCOUNTER — Encounter: Payer: Self-pay | Admitting: Internal Medicine

## 2019-01-15 ENCOUNTER — Ambulatory Visit: Payer: Medicare HMO | Admitting: Internal Medicine

## 2019-01-15 ENCOUNTER — Other Ambulatory Visit: Payer: Self-pay

## 2019-01-15 DIAGNOSIS — J45991 Cough variant asthma: Secondary | ICD-10-CM | POA: Diagnosis not present

## 2019-01-15 DIAGNOSIS — J9611 Chronic respiratory failure with hypoxia: Secondary | ICD-10-CM | POA: Diagnosis not present

## 2019-01-15 NOTE — Patient Instructions (Addendum)
Let the nebulizer medicine medication go out thru your nose and use baking soda based toothpaste after use to reduce your mouth irritation   Ceiling on prednisone(10 mg)  is 20 mg and floor is 5 mg every day    Please schedule a follow up visit in 6  months but call sooner if needed

## 2019-01-15 NOTE — Progress Notes (Signed)
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 has been having alot of "colds"coug, sinus drainage since 06-11-2018 on average using rescue inh. 2 time a day 4 days a week.  Dyspnea:  Better p albuterol / better p prednisone  Cough: since may 4th worse than usual  assoc with pnds /clear  Mucus/ not noct but severe to  point of gag/vomit Sleep: on side bed is flat/ 2 pillows variably cough/sob  SABA use: some better albuterol  Already tried 3 abx/ tessalon no better per office notes  Note flare off gerd rx which prev took per Dr Ewing Schlein  rec rec Plan A = Automatic = dulera 100 Take 2 puffs first thing in am and then another 2 puffs about 12 hours later - do not refill if not helping Work on inhaler technique:  Plan B = Backup Only use your albuterol inhaler as a rescue medication  Nexium  40 mg   Take  30-60 min before first meal of the day and Pepcid (famotidine)  20 mg one after supper  until return to office - this is the best way to tell whether stomach acid is contributing to your problem.   GERD diet      09/28/18  History of Present Illness: Dyspnea:  Much better while on dulera / less need albuterol / worse since ran out of sample and can't afford dulera on her plan Cough: more hoarse with sense of PNDS  but no real cough  Sleeping: bothered by nose running  SABA use: twice daily  02: 2lpm hs  rec Plan A = Automatic =start symbicort 80 Take 2 puffs first thing in am and then another 2  puffs about 12 hours later.  And take Prednisone 10 mg take  4 each am x 2 days,   2 each am x 2 days,  1 each am x 2 days and stop   Work on inhaler technique:  Plan B = Backup Only use your albuterol inhaler as a rescue medication     11/02/2018  Acute post ER f/u ov  ov/Kazue Cerro re: cough variant asthma+/-  uacs Chief Complaint  Patient presents with   Sinus drainage, out of breath    Does not feel any better since the hospital visit on 10/30/18 for sinus infection.  Dyspnea:  Room to room holding onto wall - poor balance and strength Cough: assoc with rhinorrhea, watery, some pnds no resp to atrovent ns  Sleeping: bed is flat disturbed by nasal symptoms > cough wheeze or sob    SABA use: none at all  02: 2lpm hs sometimes  rec For drainage / throat tickle try take CHLORPHENIRAMINE  4 mg  (Chlortab 4mg   at [employer] [employer] should be easiest to find in the green box)  take one every 4 hours as needed - available over the counter- may cause drowsiness so  start with just a bedtime dose or two and see how you tolerate it before trying in daytime   Prednisone 10 mg take  4 each am x 2 days,   2 each am x 2 days,  1 each am x 2 days and stop  Plan A = Automatic = Always=    Symbicort 80 Take 2 puffs first thing in am and then another 2 puffs about 12 hours later.  Work on inhaler technique:   Plan B = Backup (to supplement plan A, not to replace it)   Televisit 12/04/18 recs Prednisone 10 mg  2 each am until 100% better then one daily  X one week and then one half daily until seen  For drainage / throat tickle try take CHLORPHENIRAMINE  4 mg  (Chlortab 4mg   at [employer] [employer] should be easiest to find in the green box)  take one every 4 hours as needed -     12/18/2018  f/u ov/Tara Jackson re: cough variant asthma  Vs uacs  improves on pred only and can't use hfa (exhales ever time)  Chief Complaint  Patient presents with   Follow-up    Breathing has improved some.   Dyspnea:  Room to room   Baseline holds on to wall / balance and strength issues  Cough: not much still hoarse  Sleeping: bed is flat / two pillows baseline SABA uses too much and not effective technique  02: 2lpm sometimes  rec When breathing is bad start back at Prednisone 10 mg  2 each am until 100% better then one daily  X one week and then one half daily threfore  For drainage / throat tickle try take CHLORPHENIRAMINE  4 mg  New maintenance therapy = Automatic = Always = nebulizer with budesonide and performist first thing in am and 12 hours later.   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   Dyspnea:  Limited by balance not sob  Cough: minimal/ some clear nasal drainage  Sleeping: flat bed / 2 pillows  SABA use: minimal  02: 2lpm prn minimal use    No obvious day to day or daytime variability or assoc excess/ purulent sputum or mucus plugs or hemoptysis or cp or chest tightness, subjective wheeze or overt sinus or hb symptoms.   Sleeping as above  without nocturnal  or early am exacerbation  of respiratory  c/o's or need for noct saba. Also denies any obvious fluctuation of symptoms with weather or environmental changes or other aggravating or alleviating factors except as outlined above   No unusual exposure hx or h/o childhood pna/ asthma or knowledge of premature birth.  Current Allergies, Complete Past Medical History, Past Surgical History, Family History, and Social History were reviewed in [facility] record.  ROS  The following are not active complaints unless bolded Hoarseness, sore throat, dysphagia, dental problems, itching, sneezing,  nasal congestion or discharge of excess mucus or purulent secretions, ear ache,   fever, chills, sweats, unintended wt loss or wt gain, classically pleuritic or exertional cp,  orthopnea  pnd or arm/hand swelling  or leg swelling, presyncope, palpitations, abdominal pain, anorexia, nausea, vomiting, diarrhea  or change in bowel habits or change in bladder habits, change in stools or change in urine, dysuria, hematuria,  rash, arthralgias,  visual complaints, headache, numbness, weakness or ataxia or problems with walking or coordination,  change in mood or  memory.        Current Meds  Medication Sig   acetaminophen (TYLENOL) 650 MG CR tablet Take 650 mg by mouth every 8 (eight) hours as needed for pain.   albuterol (PROVENTIL HFA;VENTOLIN HFA) 108 (90 BASE) MCG/ACT inhaler Inhale 2 puffs into the lungs every 6 (six) hours as needed for wheezing.   benzonatate (TESSALON) 100 MG capsule Take 100 mg by mouth 3 (three) times daily as needed for cough.   Bisacodyl (LAXATIVE PO) Take by mouth as needed.   budesonide (PULMICORT) 0.25 MG/2ML nebulizer solution One vial twice daily        busPIRone (BUSPAR) 10 MG tablet Take 1 tablet (10 mg total) by mouth every morning.   chlorpheniramine (CHLOR-TRIMETON) 4 MG tablet Take 4 mg by mouth every 4 (four) hours as needed for allergies.   Cholecalciferol (VITAMIN D-3 PO) Take 2 tablets by mouth every morning.    clonazePAM (KLONOPIN) 0.5 MG tablet Take 0.5-1 mg by mouth See admin instructions. Takes 2 tabs in am and 1 tab in pm.   clopidogrel (PLAVIX) 75 MG tablet Take 75 mg by mouth every morning.   cyanocobalamin 100 MCG tablet Take 100 mcg by mouth daily.   dicyclomine (BENTYL) 10 MG capsule Take 1 capsule (10 mg total) by mouth 3 (three) times daily before meals.   donepezil (ARICEPT) 10 MG tablet TAKE 1 TABLET BY MOUTH EVERYDAY AT BEDTIME   esomeprazole (NEXIUM) 40 MG capsule Take 30-60 min before first meal of the day   famotidine (PEPCID) 20 MG tablet One after supper   formoterol (PERFOROMIST) 20 MCG/2ML nebulizer solution Take 2 mLs (20 mcg total) by nebulization 2 (two) times daily. Use in nebulizer twice daily  perfectly regularly   glipiZIDE (GLUCOTROL) 10 MG tablet Take 20 mg by mouth 2 (two) times daily before a meal.    guaiFENesin (MUCINEX) 600 MG 12 hr tablet Take 600 mg by mouth 2 (two) times daily as needed for cough.   insulin NPH (HUMULIN N,NOVOLIN N) 100 UNIT/ML injection Inject 0-20 Units into the skin See admin instructions. Novolin-N Give 20 unit in morning; give  5 units at 4pm; evening dose as needed.  If less than 150 blood sugar= 0units.  If greater than 200 blood sugar= give 10units to decrease by 100 blood sugar   insulin regular (NOVOLIN R,HUMULIN R) 100 units/mL injection Inject 0-25 Units into the skin See admin instructions. Give 25 unit in morning; give 20units at 4pm; evening dose as needed.  If less than 150 blood sugar= 0units.  If greater than 200 blood sugar= give 10units to decrease by 100 blood sugar   levothyroxine (SYNTHROID, LEVOTHROID) 100 MCG tablet Take 88 mcg by mouth every morning.    lidocaine (LIDODERM) 5 % Place 3 patches onto the skin daily as needed (pain). Remove & Discard patch within 12 hours or as directed by MD   linaclotide (LINZESS) 290 MCG CAPS capsule Take 290 mcg by mouth daily before breakfast.   LORazepam (ATIVAN) 0.5 MG tablet Take 0.5 mg by mouth daily as needed for anxiety.   magnesium 30 MG tablet Take 30 mg by mouth daily.   Menthol-Methyl Salicylate (MUSCLE RUB) 10-15 % CREA Apply 1 application topically as needed for muscle pain.   midodrine (PROAMATINE) 2.5 MG tablet Take 1 tablet (2.5 mg total) by mouth 3 (three) times daily with meals. (  Patient taking differently: Take 2.5 mg by mouth 3 (three) times daily with meals. As needed if blood pressure goes to low.)   nortriptyline (PAMELOR) 50 MG capsule TAKE 2 CAPSULES (100MG ) AT BEDTIME (Patient taking differently: TAKE 1 CAPSULES (100MG ) AT two times daily)   ondansetron (ZOFRAN) 4 MG tablet Take 4 mg by mouth as needed.   OXYGEN Inhale 2 L into the lungs as needed. Used at  overnight with a concentrator    polyethylene glycol (MIRALAX / GLYCOLAX) packet Take 17 g by mouth daily as needed for mild constipation.    potassium chloride SA (K-DUR,KLOR-CON) 20 MEQ tablet Take 1 tablet (20 mEq total) by mouth 2 (two) times daily.   predniSONE (DELTASONE) 10 MG tablet Prednisone 10 mg  2 each am until 100% better then one daily  X one week and then one half daily until seen   Probiotic Product (ALIGN) 4 MG CAPS Take 1 capsule by mouth daily.   rosuvastatin (CRESTOR) 20 MG tablet Take 20 mg by mouth daily.    sodium chloride 0.9 % SOLN 100 mL with phenylephrine 10 MG/ML SOLN 0.2 mL    solifenacin (VESICARE) 10 MG tablet Take 10 mg by mouth daily as needed (overactive bladder).                Past Medical History:  Diagnosis Date   Anemia    Anxiety    Arthritis    Asthma    Brittle bone disease    Cataract    CKD (chronic kidney disease)    Complication of anesthesia    hard to wake up after anesthesia, trouble turning head   Coronary artery disease, non-occlusive 2014   a. minimal CAD by cath in 2014 with 40% RI stenosis. b. low-risk NST in 11/2014.   Depression    Diabetes mellitus type 2, insulin dependent (HCC)    Diabetic neuropathy (HCC)    Diastolic dysfunction, left ventricle 11/30/14   EF 55%, grade 1 DD   DVT (deep venous thrombosis) (HCC)    "18 in RLE; 7 LLE prior to PE" (03/24/2015)   Family history of adverse reaction to anesthesia    " the whole family is hard to wake up"   Glaucoma    H/O hiatal hernia    Hyperlipidemia    Hypertension associated with diabetes (HCC)    Hypothyroidism    Kidney stones    "passed them"   Memory loss 06/03/2014   On home oxygen therapy    "2L oxygen concentrator @ night"   Osteoporosis    Oxygen desaturation during sleep    wears 2 liters of oxygen at night    Palpitations 35years ago   Cardionet monitor - revealed mostly normal sinus rhythm, sinus bradycardia with  first-degree A-V block heart rates mostly in the 50s and 60s with some 70s. No arrhythmias, PVCs or PACs noted.   PE (pulmonary thromboembolism) (HCC) x 3, last one was ~ 2003   history of recurrent RLE DVT with PE - last PE ~>13 yrs; Maintained on Plavix   Pneumonia    Presence of permanent cardiac pacemaker    Restless legs syndrome    Sleep apnea    cpap disontinued; test done 07/14/2008 ordered per  Byrum   Spinal headache    Spinal stenosis    L 3, L 4 and L 5, and C 1, C 2 and C3   Stroke (HCC) 04-19-2013   tia and stroke. Weakness Rt hand  Symptomatic bradycardia 03/24/2015   a. s/p [employer] PPM in 03/2015 by Dr. Ladona Ridgel secondary to sinus node dysfunction.    TIA (transient ischemic attack) March 2014        Objective:     01/15/2019       262  12/18/2018     259   11/02/18 265 lb 3.2 oz (120.3 kg)  10/30/18 258 lb 2.5 oz (117.1 kg)  07/25/18 258 lb 3.2 oz (117.1 kg)     w/c bound obese bf nad   Vital signs reviewed - Note on arrival 02 sats  99% on RA     Reports:Full dentures   HEENT : pt wearing mask not removed for exam due to covid -19 concerns.    NECK :  without JVD/Nodes/TM/ nl carotid upstrokes bilaterally   LUNGS: no acc muscle use,  Nl contour chest which is clear to A and P bilaterally without cough on insp or exp maneuvers   CV:  RRR  no s3 or murmur or increase in P2, and trace sym bil  LE edema  ABD:  soft and nontender with nl inspiratory excursion in the supine position. No bruits or organomegaly appreciated, bowel sounds nl  MS:    ext warm without deformities, calf tenderness, cyanosis or clubbing No obvious joint restrictions   SKIN: warm and dry without lesions    NEURO:  alert, approp, nl sensorium with  no motor or cerebellar deficits apparent.   .     Assessment

## 2019-01-16 ENCOUNTER — Encounter: Payer: Self-pay | Admitting: Internal Medicine

## 2019-01-16 NOTE — Assessment & Plan Note (Signed)
Minimal use now / advised to monitor sats with activity with goal  > 90%

## 2019-01-16 NOTE — Assessment & Plan Note (Signed)
Body mass index is 41.04 kg/m.  -  Trending up Lab Results  Component Value Date   TSH 4.194 09/12/2017     Contributing to gerd risk/ doe/reviewed the need and the process to achieve and maintain neg calorie balance > defer f/u primary care including intermittently monitoring thyroid status

## 2019-01-16 NOTE — Assessment & Plan Note (Signed)
Onset spring 2019  - CT sinus 12/21/17 wnl (as seen on head ct) - Allergy profile 07/25/2018 >  Eos 0.1 /  IgE 15 RAST neg   - 07/25/2018   try dulera 100 2bid x 2 weeks > helped but could not afford it  - 11/02/2018  After extensive coaching inhaler device,  effectiveness =    75% but no consistently at this level > symbicort 80 2bid with samples  - 12/04/2018 Prednisone 10 mg  2 each am until 100% better then one daily  X one week and then one half daily until seen  - 12/18/2018  After extensive coaching inhaler device,  effectiveness =    0% (exhales repeatedly) > rec perfomist/ bud neb > 01/15/2019 improved   Adequate control on present rx, reviewed in detail with pt/husband  The goal with a chronic steroid dependent illness is always arriving at the lowest effective dose that controls the disease/symptoms and not accepting a set "formula" which is based on statistics or guidelines that don't always take into account patient  variability or the natural hx of the dz in every individual patient, which may well vary over time.  For now therefore I recommend the patient maintain  20 mg ceiling and 5 mg floor with goal of 5 mg qod (very slow taper off)   I had an extended discussion with the patient reviewing all relevant studies completed to date and  lasting 15 to 20 minutes of a 25 minute visit    Each maintenance medication was reviewed in detail including most importantly the difference between maintenance and prns and under what circumstances the prns are to be triggered using an action plan format that is not reflected in the computer generated alphabetically organized AVS.     Please see AVS for specific instructions unique to this visit that I personally wrote and verbalized to the the pt in detail and then reviewed with pt  by my nurse highlighting any  changes in therapy recommended at today's visit to their plan of care.

## 2019-01-20 DIAGNOSIS — J449 Chronic obstructive pulmonary disease, unspecified: Secondary | ICD-10-CM | POA: Diagnosis not present

## 2019-01-22 DIAGNOSIS — I10 Essential (primary) hypertension: Secondary | ICD-10-CM | POA: Diagnosis not present

## 2019-01-22 DIAGNOSIS — I509 Heart failure, unspecified: Secondary | ICD-10-CM | POA: Diagnosis not present

## 2019-01-22 DIAGNOSIS — G4733 Obstructive sleep apnea (adult) (pediatric): Secondary | ICD-10-CM | POA: Diagnosis not present

## 2019-01-24 DIAGNOSIS — R69 Illness, unspecified: Secondary | ICD-10-CM | POA: Diagnosis not present

## 2019-02-20 DIAGNOSIS — J449 Chronic obstructive pulmonary disease, unspecified: Secondary | ICD-10-CM | POA: Diagnosis not present

## 2019-02-22 DIAGNOSIS — G4733 Obstructive sleep apnea (adult) (pediatric): Secondary | ICD-10-CM | POA: Diagnosis not present

## 2019-02-22 DIAGNOSIS — I509 Heart failure, unspecified: Secondary | ICD-10-CM | POA: Diagnosis not present

## 2019-02-22 DIAGNOSIS — I10 Essential (primary) hypertension: Secondary | ICD-10-CM | POA: Diagnosis not present

## 2019-02-24 ENCOUNTER — Other Ambulatory Visit: Payer: Self-pay | Admitting: Internal Medicine

## 2019-03-19 ENCOUNTER — Emergency Department (HOSPITAL_COMMUNITY)
Admission: EM | Admit: 2019-03-19 | Discharge: 2019-03-19 | Disposition: A | Discharge status: Home / Self Care | Payer: Medicare HMO | Attending: Emergency Medicine | Admitting: Emergency Medicine

## 2019-03-19 ENCOUNTER — Encounter (HOSPITAL_COMMUNITY): Payer: Self-pay | Admitting: Emergency Medicine

## 2019-03-19 ENCOUNTER — Other Ambulatory Visit: Payer: Self-pay

## 2019-03-19 ENCOUNTER — Emergency Department (HOSPITAL_COMMUNITY): Payer: Medicare HMO

## 2019-03-19 DIAGNOSIS — W19XXXA Unspecified fall, initial encounter: Secondary | ICD-10-CM

## 2019-03-19 DIAGNOSIS — S0990XA Unspecified injury of head, initial encounter: Secondary | ICD-10-CM | POA: Diagnosis present

## 2019-03-19 DIAGNOSIS — I129 Hypertensive chronic kidney disease with stage 1 through stage 4 chronic kidney disease, or unspecified chronic kidney disease: Secondary | ICD-10-CM | POA: Diagnosis not present

## 2019-03-19 DIAGNOSIS — R531 Weakness: Secondary | ICD-10-CM | POA: Diagnosis not present

## 2019-03-19 DIAGNOSIS — Z7901 Long term (current) use of anticoagulants: Secondary | ICD-10-CM | POA: Diagnosis not present

## 2019-03-19 DIAGNOSIS — Z950 Presence of cardiac pacemaker: Secondary | ICD-10-CM | POA: Diagnosis not present

## 2019-03-19 DIAGNOSIS — N182 Chronic kidney disease, stage 2 (mild): Secondary | ICD-10-CM | POA: Diagnosis not present

## 2019-03-19 DIAGNOSIS — J45909 Unspecified asthma, uncomplicated: Secondary | ICD-10-CM | POA: Insufficient documentation

## 2019-03-19 DIAGNOSIS — E039 Hypothyroidism, unspecified: Secondary | ICD-10-CM | POA: Diagnosis not present

## 2019-03-19 DIAGNOSIS — Y999 Unspecified external cause status: Secondary | ICD-10-CM | POA: Diagnosis not present

## 2019-03-19 DIAGNOSIS — Y929 Unspecified place or not applicable: Secondary | ICD-10-CM | POA: Diagnosis not present

## 2019-03-19 DIAGNOSIS — Z794 Long term (current) use of insulin: Secondary | ICD-10-CM | POA: Diagnosis not present

## 2019-03-19 DIAGNOSIS — R0789 Other chest pain: Secondary | ICD-10-CM | POA: Diagnosis not present

## 2019-03-19 DIAGNOSIS — E1122 Type 2 diabetes mellitus with diabetic chronic kidney disease: Secondary | ICD-10-CM | POA: Diagnosis not present

## 2019-03-19 DIAGNOSIS — W010XXA Fall on same level from slipping, tripping and stumbling without subsequent striking against object, initial encounter: Secondary | ICD-10-CM | POA: Diagnosis not present

## 2019-03-19 DIAGNOSIS — Z96653 Presence of artificial knee joint, bilateral: Secondary | ICD-10-CM | POA: Diagnosis not present

## 2019-03-19 DIAGNOSIS — S0083XA Contusion of other part of head, initial encounter: Secondary | ICD-10-CM | POA: Diagnosis not present

## 2019-03-19 DIAGNOSIS — Y939 Activity, unspecified: Secondary | ICD-10-CM | POA: Insufficient documentation

## 2019-03-19 DIAGNOSIS — Z79899 Other long term (current) drug therapy: Secondary | ICD-10-CM | POA: Diagnosis not present

## 2019-03-19 LAB — URINALYSIS, ROUTINE W REFLEX MICROSCOPIC
Bilirubin Urine: NEGATIVE
Glucose, UA: >=500 mg/dL — AB
Hgb urine dipstick: NEGATIVE
Ketones, ur: 5 mg/dL — AB
Leukocytes,Ua: NEGATIVE
Nitrite: NEGATIVE
Protein, ur: 30 mg/dL — AB
Specific Gravity, Urine: 1.024 (ref 1.005–1.030)
pH: 5 (ref 5.0–8.0)

## 2019-03-19 LAB — BLOOD GAS, VENOUS
Acid-base deficit: 0.8 mmol/L (ref 0.0–2.0)
Bicarbonate: 26.3 mmol/L (ref 20.0–28.0)
O2 Saturation: 36.8 %
Patient temperature: 98.6
pCO2, Ven: 54.9 mmHg (ref 44.0–60.0)
pH, Ven: 7.301 (ref 7.250–7.430)
pO2, Ven: <31 mmHg — CL (ref 32.0–45.0)

## 2019-03-19 LAB — CBC
HCT: 40.1 % (ref 36.0–46.0)
Hemoglobin: 12.7 g/dL (ref 12.0–15.0)
MCH: 32 pg (ref 26.0–34.0)
MCHC: 31.7 g/dL (ref 30.0–36.0)
MCV: 101 fL — ABNORMAL HIGH (ref 80.0–100.0)
Platelets: 249 10*3/uL (ref 150–400)
RBC: 3.97 MIL/uL (ref 3.87–5.11)
RDW: 12.1 % (ref 11.5–15.5)
WBC: 5.8 10*3/uL (ref 4.0–10.5)
nRBC: 0 % (ref 0.0–0.2)

## 2019-03-19 LAB — DIFFERENTIAL
Abs Immature Granulocytes: 0.02 10*3/uL (ref 0.00–0.07)
Basophils Absolute: 0.1 10*3/uL (ref 0.0–0.1)
Basophils Relative: 1 %
Eosinophils Absolute: 0.1 10*3/uL (ref 0.0–0.5)
Eosinophils Relative: 2 %
Immature Granulocytes: 0 %
Lymphocytes Relative: 21 %
Lymphs Abs: 1.2 10*3/uL (ref 0.7–4.0)
Monocytes Absolute: 0.2 10*3/uL (ref 0.1–1.0)
Monocytes Relative: 4 %
Neutro Abs: 4.2 10*3/uL (ref 1.7–7.7)
Neutrophils Relative %: 72 %

## 2019-03-19 LAB — BRAIN NATRIURETIC PEPTIDE: B Natriuretic Peptide: 86.3 pg/mL (ref 0.0–100.0)

## 2019-03-19 LAB — COMPREHENSIVE METABOLIC PANEL
ALT: 12 U/L (ref 0–44)
AST: 19 U/L (ref 15–41)
Albumin: 3.8 g/dL (ref 3.5–5.0)
Alkaline Phosphatase: 46 U/L (ref 38–126)
Anion gap: 11 (ref 5–15)
BUN: 21 mg/dL (ref 8–23)
CO2: 23 mmol/L (ref 22–32)
Calcium: 9.4 mg/dL (ref 8.9–10.3)
Chloride: 102 mmol/L (ref 98–111)
Creatinine, Ser: 1.64 mg/dL — ABNORMAL HIGH (ref 0.44–1.00)
GFR calc Af Amer: 34 mL/min — ABNORMAL LOW (ref >60)
GFR calc non Af Amer: 29 mL/min — ABNORMAL LOW (ref >60)
Glucose, Bld: 383 mg/dL — ABNORMAL HIGH (ref 70–99)
Potassium: 4 mmol/L (ref 3.5–5.1)
Sodium: 136 mmol/L (ref 135–145)
Total Bilirubin: 0.5 mg/dL (ref 0.3–1.2)
Total Protein: 6.8 g/dL (ref 6.5–8.1)

## 2019-03-19 LAB — TROPONIN I (HIGH SENSITIVITY): Troponin I (High Sensitivity): 6 ng/L (ref <18)

## 2019-03-19 LAB — PROTIME-INR
INR: 1 (ref 0.8–1.2)
Prothrombin Time: 12.6 seconds (ref 11.4–15.2)

## 2019-03-19 LAB — CBG MONITORING, ED: Glucose-Capillary: 361 mg/dL — ABNORMAL HIGH (ref 70–99)

## 2019-03-19 LAB — APTT: aPTT: 27 seconds (ref 24–36)

## 2019-03-19 MED ORDER — INSULIN ASPART 100 UNIT/ML ~~LOC~~ SOLN
10.0000 [IU] | Freq: Once | SUBCUTANEOUS | Status: AC
  Administered 2019-03-19: 10 [IU] via SUBCUTANEOUS
  Filled 2019-03-19: qty 0.1

## 2019-03-19 NOTE — Progress Notes (Signed)
Arrived to ED, pt has 20g PIV in R AC. Cancel consult.

## 2019-03-19 NOTE — ED Notes (Signed)
Pt verbalizes understanding of DC instructions. Pt belongings returned and is ambulatory out of ED.  

## 2019-03-19 NOTE — ED Triage Notes (Signed)
Patient fell yesterday and landed face-forward around 4pm. Pateint states she feels very shaky, she has had a stroke in the past and feels like this is similar. "I do not feel clear."

## 2019-03-19 NOTE — ED Provider Notes (Signed)
[facility] DEPT Provider Note   CSN: [id number] Arrival date & time: 03/19/19  1236     History Chief Complaint  Patient presents with   Tara Jackson is a 79 y.o. female.  HPI Patient reports she fell yesterday at 4 PM.  She reports it was as if she was "thrust forward ".  She did not really feel like she had lost her balance exactly.  She landed face first and hit her forehead.  She reports she had a big swollen area on her forehead.  She denies that there was a change in her vision.  She did not have a focal weakness of upper or lower extremity.  She reports however she could not get up and was on the floor for about an hour until her husband helped her back into bed.  She reports she has not walked since then.  However, she reports at baseline she is typically in a wheelchair or sometimes using a rolling walker or a seated scooter.  She does not walk independently at baseline.  She reports she does not feel exactly dizzy but really does not feel like herself.  She reports that she takes Plavix daily due to history of multiple DVT with PE.  She has not had any increased swelling or pain in her legs.  She denies she has pain in the chest but does endorse some degree of "tightness" today.  She also describes "nodules" that she identified today that were tender and she attributed them to having fallen yesterday.  She is putting some Lidoderm patches on them.  She reports she sometimes has pain with deep inspiration but not right now during the exam.  No abdominal pain.  She reports she wears nighttime oxygen.  She does not wear oxygen regularly.  She does take inhalers and sees a pulmonologist regularly.  No recent fevers or chills.    Past Medical History:  Diagnosis Date   Anemia    Anxiety    Arthritis    Asthma    Brittle bone disease    CKD (chronic kidney disease)    Complication of anesthesia    hard to wake up after anesthesia,  trouble turning head   Coronary artery disease, non-occlusive 2014   a. minimal CAD by cath in 2014 with 40% RI stenosis. b. low-risk NST in 11/2014.   Depression    Diabetes mellitus type 2, insulin dependent (HCC)    Diabetic neuropathy (HCC)    Diabetic retinopathy ([place])    NPDR OU   Diastolic dysfunction, left ventricle 11/30/14   EF 55%, grade 1 DD   DVT (deep venous thrombosis) ([place])    "18 in RLE; 7 LLE prior to PE" (03/24/2015)   Family history of adverse reaction to anesthesia    " the whole family is hard to wake up"   Glaucoma    H/O hiatal hernia    Hyperlipidemia    Hypertension associated with diabetes ([place])    Hypertensive retinopathy    OU   Hypothyroidism    Kidney stones    "passed them"   Macular degeneration    Exu OU   Memory loss 06/03/2014   On home oxygen therapy    "2L oxygen concentrator @ night"   Osteoporosis    Oxygen desaturation during sleep    wears 2 liters of oxygen at night    Palpitations 35years ago   Cardionet monitor - revealed mostly normal  sinus rhythm, sinus bradycardia with first-degree A-V block heart rates mostly in the 50s and 60s with some 70s. No arrhythmias, PVCs or PACs noted.   PE (pulmonary thromboembolism) ([place]) x 3, last one was ~ 2003   history of recurrent RLE DVT with PE - last PE ~>13 yrs; Maintained on Plavix   Pneumonia    Presence of permanent cardiac pacemaker    Restless legs syndrome    Sleep apnea    cpap disontinued; test done 07/14/2008 ordered per  [place]   Spinal headache    Spinal stenosis    L 3, L 4 and L 5, and C 1, C 2 and C3   Stroke ([place]) 04-19-2013   tia and stroke. 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 s/p [employer] PPM in 03/2015 by Dr. Lovena Le secondary to sinus node dysfunction.    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   Acute renal failure superimposed on chronic kidney disease ([place]) 10/24/2016   Neurocardiogenic syncope 10/23/2016   Chronic anemia 10/11/2016   Pacemaker 04/26/2015   Chronic daily headache 04/07/2015   Symptomatic bradycardia 03/24/2015   Sinus node dysfunction (HCC) 03/24/2015   Chronic diastolic heart failure ([place])    Diabetes mellitus type 2 in obese ([place])    Normal coronary arteries 01/14/2015   Normal cardiac stress test 11/30/2014   Bradycardia 11/28/2014   Type 2 diabetes mellitus with renal manifestations ([place]) 11/28/2014   Morbid obesity ([place]) 11/28/2014   CKD stage 3 due to type 2 diabetes mellitus ([place]) 11/28/2014   Memory loss 06/03/2014   AKI (acute kidney injury) ([place])    Chest pain 04/19/2014   Pleuritic chest pain 04/19/2014   Diabetes mellitus ([place]) 04/19/2014   Edema of both legs 04/19/2014   Dyspnea    DOE (dyspnea on exertion)    Tremor of right hand 09/20/2013   Headache(784.0) 09/19/2013   Restless legs syndrome (RLS) 09/19/2013   Constipation due to pain medication 08/21/2013   Ankle edema 08/21/2013   PVC's (premature ventricular contractions) 46/56/8127   Diastolic dysfunction, left ventricle    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 Lovena Le   ESOPHAGOGASTRODUODENOSCOPY  08/09/2011   Procedure: ESOPHAGOGASTRODUODENOSCOPY (EGD);  Surgeon: Jeryl Columbia, MD;  Location: [facility];  Service: Endoscopy;  Laterality: N/A;   ESOPHAGOGASTRODUODENOSCOPY (EGD) WITH PROPOFOL N/A 11/12/2013   Procedure: ESOPHAGOGASTRODUODENOSCOPY (EGD) WITH PROPOFOL;  Surgeon: Jeryl Columbia, MD;  Location: [facility]OS[facility];  Service: Endoscopy;  Laterality: N/A;   ESOPHAGOGASTRODUODENOSCOPY (EGD) WITH PROPOFOL N/A 05/05/2016   Procedure: ESOPHAGOGASTRODUODENOSCOPY (EGD) WITH PROPOFOL;  Surgeon: Clarene Essex,  MD;  Location: [facility];  Service: Endoscopy;  Laterality: N/A;   EYE SURGERY Bilateral    Cat Sx OU   GLAUCOMA SURGERY Bilateral 2000s   "laser"   HOT HEMOSTASIS  08/09/2011   Procedure: HOT HEMOSTASIS (ARGON PLASMA COAGULATION/BICAP);  Surgeon: Jeryl Columbia, MD;  Location: [facility];  Service: Endoscopy;  Laterality: N/A;   HOT HEMOSTASIS N/A 11/12/2013   Procedure: HOT HEMOSTASIS (ARGON PLASMA COAGULATION/BICAP);  Surgeon: Jeryl Columbia, MD;  Location: [facility];  Service: Endoscopy;  Laterality: N/A;   INSERT / REPLACE / REMOVE PACEMAKER  03/24/2015   IRIDOTOMY / IRIDECTOMY Bilateral    JOINT REPLACEMENT     KNEE SURGERY Left done with 2nd left knee replacement   spacer bar placement   LEFT HEART CATHETERIZATION WITH CORONARY ANGIOGRAM N/A 02/14/2012   Procedure: LEFT HEART CATHETERIZATION WITH CORONARY ANGIOGRAM;  Surgeon: Leonie Man, MD;  Location: [facility] CATH LAB;  Service: Cardiovascular:: no evidence of obstructive coronary disease ,low EDP with normal EF   LEV DOPPLER  05/29/2012   RIGHT EXTREM. NORMAL VENOUS DUPLEX   NM MYOCAR PERF WALL MOTION  01/26/2012; 11/2014   a. EF 79%,LV normal,ST SEGMENT CHANGE SUGGESTIVE OF ISCHEMIA.; LOW RISK, EF 60%. No ischemia or infarction. No wall motion abnormality    PACEMAKER PLACEMENT     REVISION TOTAL KNEE ARTHROPLASTY  Left    TONSILLECTOMY     TOTAL KNEE ARTHROPLASTY Bilateral    TRANSTHORACIC ECHOCARDIOGRAM  11/2017   11/2017: EF 60-65%. Gr1 DD. PAP ~33 mmHg.  Mild TR. PPM leads in RA & RV.    TRANSTHORACIC ECHOCARDIOGRAM  2014; October 2016   a. EF 82-95%; normal diastolic pressures, no regional wall motion motion abnormalities;; b/ F 55-60%. Normal wall motion. GR 1 DD. No valve lesions   TRIGGER FINGER RELEASE  11/22/2011   Procedure: RELEASE TRIGGER FINGER/A-1 PULLEY;  Surgeon: Meredith Pel, MD;  Location: [facility];  Service: Orthopedics;  Laterality: Left;  Left trigger thumb release   TRIGGER FINGER RELEASE Right yrs ago     OB History   No obstetric history on file.     Family History  Problem Relation Age of Onset   Cancer Mother    Diabetes Mother    Cancer - Prostate Father    Diabetes Father    Retinal detachment Son    Diabetes Sister    Diabetes Brother    Diabetes Paternal Grandmother     Social History   Tobacco Use   Smoking status: Never Smoker   Smokeless tobacco: Never Used  Substance Use Topics   Alcohol use: No    Alcohol/week: 0.0 standard drinks   Drug use: No    Home Medications Prior to Admission medications   Medication Sig Start Date End Date Taking? Authorizing Provider  acetaminophen (TYLENOL) 650 MG CR tablet Take 650 mg by mouth every 8 (eight) hours as needed for pain.   Yes [provider]  albuterol (PROVENTIL HFA;VENTOLIN HFA) 108 (90 BASE) MCG/ACT inhaler Inhale 2 puffs into the lungs every 6 (six) hours as needed for wheezing.   Yes [provider]  budesonide (PULMICORT) 0.25 MG/2ML nebulizer solution One vial twice daily Patient taking differently: Take 0.25 mg by nebulization 2 (two) times daily as needed (shortness of breath).  12/21/18  Yes Tanda Rockers, MD  busPIRone (BUSPAR) 10 MG tablet Take 1 tablet (10 mg total) by mouth every morning. 06/03/14  Yes Garvin Fila, MD  Cholecalciferol (VITAMIN  D-3 PO) Take 2 tablets by mouth every morning.    Yes [provider]  clonazePAM (KLONOPIN) 0.5 MG tablet Take 0.5-1 mg by mouth See admin instructions. Takes 2 tabs in am and 1 tab in pm.   Yes [provider]  clopidogrel (PLAVIX) 75 MG tablet Take 75 mg by mouth every morning.   Yes [provider]  cyanocobalamin 100 MCG tablet Take 100 mcg by mouth daily.   Yes [provider]  dicyclomine (BENTYL) 10 MG capsule Take 1 capsule (10 mg total) by mouth 3 (three) times daily before meals. 09/14/17  Yes Vann, Jessica U, DO  donepezil (ARICEPT) 10 MG tablet TAKE 1 TABLET BY MOUTH EVERYDAY AT BEDTIME Patient taking differently: Take 10 mg by mouth at bedtime.  10/01/18  Yes Garvin Fila, MD  esomeprazole (NEXIUM) 40 MG capsule Take 30-60 min before first meal of the day Patient taking differently: Take 40 mg by mouth daily before breakfast.  07/25/18  Yes Tanda Rockers, MD  formoterol (PERFOROMIST) 20 MCG/2ML nebulizer solution Take 2 mLs (20 mcg total) by nebulization 2 (two) times daily. Use in nebulizer twice daily perfectly regularly 12/21/18  Yes Tanda Rockers, MD  glipiZIDE (GLUCOTROL) 10 MG tablet Take 20 mg by mouth 2 (two) times daily before a meal.  05/03/14  Yes [provider]  guaiFENesin (MUCINEX) 600 MG 12 hr tablet Take 600 mg by mouth 2 (two) times daily as needed for cough.   Yes [provider]  insulin NPH (HUMULIN N,NOVOLIN N) 100 UNIT/ML injection Inject 0-20 Units into the skin See admin instructions. Novolin-N Give 20 unit in morning; give  5 units at 4pm; evening dose as needed.  If less than 150 blood sugar= 0units.  If greater than 200 blood sugar= give 10units to decrease by 100 blood sugar   Yes [provider]  insulin regular (NOVOLIN R,HUMULIN R) 100 units/mL injection Inject 0-25 Units into the skin See admin instructions. Give 25 unit in morning; give 20units at 4pm; evening dose as needed.  If less than  150 blood sugar= 0units.  If greater than 200 blood sugar= give 10units to decrease by 100 blood sugar   Yes [provider]  LEVOXYL 75 MCG tablet Take 75 mcg by mouth daily. 02/12/19  Yes [provider]  lidocaine (LIDODERM) 5 % Place 3 patches onto the skin daily as needed (pain). Remove & Discard patch within 12 hours or as directed by MD   Yes [provider]  linaclotide (LINZESS) 290 MCG CAPS capsule Take 290 mcg by mouth daily before breakfast.   Yes [provider]  LORazepam (ATIVAN) 0.5 MG tablet Take 0.5 mg by mouth daily as needed for anxiety.   Yes [provider]  Menthol-Methyl Salicylate (MUSCLE RUB) 10-15 % CREA Apply 1 application topically as needed for muscle pain.   Yes [provider]  midodrine (PROAMATINE) 2.5 MG tablet Take 1 tablet (2.5 mg total) by mouth 3 (three) times daily with meals. Patient taking differently: Take 2.5 mg by mouth 3 (three) times daily with meals. As needed if blood pressure goes to low. 10/19/17  Yes Leonie Man, MD  nortriptyline (PAMELOR) 50 MG capsule TAKE 2 CAPSULES (100MG ) AT BEDTIME Patient taking differently: Take 50 mg by mouth 2 (two) times daily.  06/02/16  Yes Garvin Fila, MD  ondansetron (ZOFRAN) 4 MG tablet Take 4 mg by mouth as needed for nausea or vomiting.  08/16/17  Yes [provider]  OXYGEN Inhale 2 L into the lungs as needed. Used at overnight with a concentrator    Yes [provider]  polyethylene glycol (MIRALAX / GLYCOLAX) packet Take 17 g by mouth daily as needed for mild constipation.    Yes [provider]  potassium chloride SA (K-DUR,KLOR-CON) 20 MEQ tablet Take 1 tablet (20 mEq total) by mouth 2 (two) times daily. 06/22/15  Yes Davonna Belling, MD  Probiotic Product (ALIGN) 4 MG CAPS Take 1 capsule by mouth daily.   Yes [provider]  rosuvastatin (CRESTOR) 20 MG tablet Take 20 mg by mouth daily.  03/13/17  Yes [provider]  solifenacin (VESICARE) 5 MG tablet Take 5 mg by mouth daily. 02/24/19  Yes [provider]  terconazole (TERAZOL 7) 0.4 % vaginal cream Place 1 applicator vaginally daily as needed for itching. 02/12/19  Yes [provider]  famotidine (PEPCID) 20 MG tablet One after supper Patient not taking: Reported on 03/19/2019 07/25/18   Tanda Rockers, MD  furosemide (LASIX) 20 MG tablet Take 1 tablet (20 mg total) by mouth daily as needed for fluid or edema. 09/14/17 03/19/19  Geradine Girt, DO  predniSONE (DELTASONE) 10 MG tablet TAKE 2 TABS EVERY MORNING UNTIL 100% BETTER THEN TAKE 1 TAB EVERY DAY FOR 1 WEEK THEN 1/2 TAB DAILY Patient not taking: Reported on 03/19/2019 02/25/19   Tanda Rockers, MD    Allergies    Iodine, Iohexol, Metoclopramide hcl, Sulfa antibiotics, Lactose intolerance (gi), and Lansoprazole  Review of Systems   Review of Systems 10 Systems reviewed and are negative for acute change except as noted in the HPI.  Physical Exam Updated Vital Signs BP (!) 151/74    Pulse (!) 59    Temp (!) 96.3 F (35.7 C) (Oral)    Resp 18    Ht 5\' 7"  (1.702 m)    Wt 117.9 kg    SpO2 100%    BMI 40.72 kg/m   Physical Exam Constitutional:      Comments: Patient is alert and appropriate.  She is giving full history.  No respiratory distress at rest.  Nontoxic.  HENT:     Head:     Comments: Patient has a diffuse soft hematoma to the upper forehead.  No superficial skin injury or abrasion.  No other facial swelling or abrasions.    Nose: Nose normal.     Mouth/Throat:     Mouth: Mucous membranes are dry.     Pharynx: Oropharynx is clear.  Eyes:     Extraocular Movements: Extraocular movements intact.     Conjunctiva/sclera: Conjunctivae normal.     Pupils: Pupils are equal, round, and reactive to light.  Cardiovascular:     Rate and Rhythm: Normal rate and regular rhythm.  Pulmonary:     Comments: Lungs are clear.  No respiratory distress.  Patient has a  Lidoderm patch on her right anterior lateral chest at the upper breast.  Reports there is a nodule in this area.  I cannot perceive any real specific nodules, areas of distinct swelling or skin changes. Abdominal:     General: There is no distension.     Palpations: Abdomen is soft.     Tenderness: There is no abdominal tenderness. There is no guarding.  Musculoskeletal:     Cervical back: Neck supple.     Comments: Bilateral calves are soft and nontender.  No significant peripheral edema.  Right foot  has moderate swelling of the forefoot and ecchymotic bruising.  No swelling at the ankle.  No ankle deformity.  Feet are warm and dry.  No other extremity injuries or deformities.  Bilateral old healed scars on the knees from knee replacements.  Skin:    General: Skin is warm and dry.  Neurological:     Comments: Ental status is alert with normal cognitive function.  Patient is a good historian.  ECS is 15.  Beach is clear.  No visual field cuts.  Ocular motions intact.  Cranial nerves intact.  Patient can grip with bilateral upper extremities with symmetric grips.  Symmetric push pull.  Patient does seem to be generally weak and deconditioned in terms of muscular bulk and tone.  He can elevate each leg off of the bed independently and hold against resistance.  She seems slightly tremulous.  No localizing motor deficits.  Psychiatric:        Mood and Affect: Mood normal.     ED Results / Procedures / Treatments   Labs (all labs ordered are listed, but only abnormal results are displayed) Labs Reviewed  CBC - Abnormal; Notable for the following components:      Result Value   MCV 101.0 (*)    All other components within normal limits  COMPREHENSIVE METABOLIC PANEL - Abnormal; Notable for the following components:   Glucose, Bld 383 (*)    Creatinine, Ser 1.64 (*)    GFR calc non Af Amer 29 (*)    GFR calc Af Amer 34 (*)    All other components within normal limits  BLOOD GAS, VENOUS -  Abnormal; Notable for the following components:   pO2, Ven <31.0 (*)    All other components within normal limits  URINALYSIS, ROUTINE W REFLEX MICROSCOPIC - Abnormal; Notable for the following components:   Color, Urine AMBER (*)    APPearance CLOUDY (*)    Glucose, UA >=500 (*)    Ketones, ur 5 (*)    Protein, ur 30 (*)    Bacteria, UA MANY (*)    All other components within normal limits  CBG MONITORING, ED - Abnormal; Notable for the following components:   Glucose-Capillary 361 (*)    All other components within normal limits  PROTIME-INR  APTT  DIFFERENTIAL  BRAIN NATRIURETIC PEPTIDE  TROPONIN I (HIGH SENSITIVITY)  TROPONIN I (HIGH SENSITIVITY)    EKG EKG Interpretation  Date/Time:  Tuesday March 19 2019 13:01:50 EST Ventricular Rate:  64 PR Interval:    QRS Duration: 110 QT Interval:  459 QTC Calculation: 478 R Axis:   2 Text Interpretation: Sinus rhythm Prolonged PR interval Inferior infarct, age indeterminate Abnrm T, consider ischemia, anterolateral lds agree, difuse t wave inversions since last tracing Confirmed by Charlesetta Shanks [id number]) on 03/19/2019 3:02:20 PM   Radiology CT Head Wo Contrast  Result Date: 03/19/2019 CLINICAL DATA:  Facial trauma fall Fall yesterday landing face first. EXAM: CT HEAD WITHOUT CONTRAST TECHNIQUE: Contiguous axial images were obtained from the base of the skull through the vertex without intravenous contrast. COMPARISON:  Head CT 12/21/2017 FINDINGS: Brain: No intracranial hemorrhage, mass effect, or midline shift. Stable age related atrophy. No hydrocephalus. The basilar cisterns are patent. Mild for age chronic small vessel ischemia. No evidence of territorial infarct or acute ischemia. No extra-axial or intracranial fluid collection. Vascular: Atherosclerosis of skullbase vasculature without hyperdense vessel or abnormal calcification. Skull: No fracture or focal lesion. Sinuses/Orbits: No acute findings or evidence of fracture.  Bilateral cataract resection. Mastoid air cells are clear. Other: None. IMPRESSION: No acute intracranial abnormality. No skull fracture. Electronically Signed   By: Keith Rake M.D.   On: 03/19/2019 16:14   CT Cervical Spine Wo Contrast  Result Date: 03/19/2019 CLINICAL DATA:  Neck trauma (Age >= 65y) Fall yesterday. Patient reports neck pain and swelling. EXAM: CT CERVICAL SPINE WITHOUT CONTRAST TECHNIQUE: Multidetector CT imaging of the cervical spine was performed without intravenous contrast. Multiplanar CT image reconstructions were also generated. COMPARISON:  CT 08/04/2017 FINDINGS: Alignment: Normal. Skull base and vertebrae: No acute fracture. Vertebral body heights are maintained. The dens and skull base are intact. Chronic fragment 2 shin about the spinous processes of C5 and C6. Soft tissues and spinal canal: No prevertebral fluid or swelling. No visible canal hematoma. Disc levels: Degenerative disc disease is most prominent at C5-C6. Prominent multifocal facet hypertrophy. Upper chest: Negative. Other: None. IMPRESSION: Degenerative change without acute fracture or subluxation of the cervical spine. Electronically Signed   By: Keith Rake M.D.   On: 03/19/2019 16:17   DG Chest Port 1 View  Result Date: 03/19/2019 CLINICAL DATA:  79 year old female with fall. EXAM: PORTABLE CHEST 1 VIEW COMPARISON:  Chest radiograph dated 10/30/2018. FINDINGS: No focal consolidation, pleural effusion, pneumothorax. Stable mild cardiomegaly. Left pectoral pacemaker device. No acute osseous pathology. IMPRESSION: No active disease. Electronically Signed   By: Anner Crete M.D.   On: 03/19/2019 16:02   DG Foot Complete Right  Result Date: 03/19/2019 CLINICAL DATA:  Fall yesterday with right foot/heel pain. EXAM: RIGHT FOOT COMPLETE - 3+ VIEW COMPARISON:  Radiograph 10/05/2012 FINDINGS: There is no evidence of fracture or dislocation. Soft tissue calcifications lateral to the fifth toe proximal  interphalangeal joint appear chronic. Mild degenerative change at the first and fifth metatarsal phalangeal joints. Small accessory os navicular. Plantar calcaneal spur. There are vascular calcifications. Dorsal soft tissue edema primarily over the metatarsals. IMPRESSION: No acute fracture or dislocation of the right foot. Dorsal soft tissue edema. Plantar calcaneal spur. Electronically Signed   By: Keith Rake M.D.   On: 03/19/2019 16:04    Procedures Procedures (including critical care time)  Medications Ordered in ED Medications  insulin aspart (novoLOG) injection 10 Units (10 Units Subcutaneous Given 03/19/19 1810)    ED Course  I have reviewed the triage vital signs and the nursing notes.  Pertinent labs & imaging results that were available during my care of the patient were reviewed by me and considered in my medical decision making (see chart for details).    MDM Rules/Calculators/A&P                     Patient presents after a fall yesterday.  She is concerned because she does have history of stroke.  She would like to make sure that she did not have another stroke.  She however does not have any focal neurologic deficits.  Patient does take Plavix routinely.  She cannot get an MRI due to her pacemaker.  CT does not show any acute findings.  Patient's mental status is alert and interactive.  She follows all commands for neurologic testing.  At baseline, she describes very limited mobility.  Mobility is basically for transfers to wheelchair or sometimes rolling walker.  No acute findings today.  Patient's urinalysis is somewhat contaminated but no obvious infection.  She does not have fever or constitutional symptoms.  No significant trauma identified.  This time, I feel patient is stable to  return home.  She lives with her husband.  Return precautions provided. Final Clinical Impression(s) / ED Diagnoses Final diagnoses:  Fall, initial encounter  Contusion of face, initial  encounter  General weakness    Rx / DC Orders ED Discharge Orders    None       Charlesetta Shanks, MD 03/19/19 1945

## 2019-03-19 NOTE — ED Notes (Signed)
Pt to CT

## 2019-03-19 NOTE — Discharge Instructions (Addendum)
1.  Schedule recheck with your doctor within the next 2 to 3 days. 2.  Return to the emergency department if you have concerning or worsening symptoms. 3.  Continue your regularly prescribed medications.

## 2019-03-19 NOTE — ED Notes (Signed)
Staff requested patient to ambulate with staff as ordered by MD. Patient stated she only uses a wheelchair to get around at home and does not ambulate. Dr. Johnney Killian aware.

## 2019-03-21 ENCOUNTER — Ambulatory Visit (INDEPENDENT_AMBULATORY_CARE_PROVIDER_SITE_OTHER): Payer: Medicare HMO | Admitting: *Deleted

## 2019-03-21 DIAGNOSIS — I495 Sick sinus syndrome: Secondary | ICD-10-CM | POA: Diagnosis not present

## 2019-03-21 LAB — CUP PACEART REMOTE DEVICE CHECK
Battery Remaining Longevity: 120 mo
Battery Remaining Percentage: 100 %
Brady Statistic RA Percent Paced: 81 %
Brady Statistic RV Percent Paced: 61 %
Date Time Interrogation Session: 20210211044600
Implantable Lead Implant Date: 20170214
Implantable Lead Implant Date: 20170214
Implantable Lead Location: 753859
Implantable Lead Location: 753860
Implantable Lead Model: 7740
Implantable Lead Model: 7741
Implantable Lead Serial Number: 649859
Implantable Lead Serial Number: 728741
Implantable Pulse Generator Implant Date: 20170214
Lead Channel Impedance Value: 699 Ohm
Lead Channel Impedance Value: 729 Ohm
Lead Channel Pacing Threshold Amplitude: 0.5 V
Lead Channel Pacing Threshold Amplitude: 1 V
Lead Channel Pacing Threshold Pulse Width: 0.4 ms
Lead Channel Pacing Threshold Pulse Width: 0.4 ms
Lead Channel Setting Pacing Amplitude: 1.8 V
Lead Channel Setting Pacing Amplitude: 2 V
Lead Channel Setting Pacing Pulse Width: 0.4 ms
Lead Channel Setting Sensing Sensitivity: 2.5 mV
Pulse Gen Serial Number: 718098

## 2019-03-22 ENCOUNTER — Encounter (HOSPITAL_COMMUNITY): Payer: Self-pay | Admitting: Emergency Medicine

## 2019-03-22 ENCOUNTER — Emergency Department (HOSPITAL_COMMUNITY): Payer: Medicare HMO

## 2019-03-22 ENCOUNTER — Emergency Department (HOSPITAL_COMMUNITY)
Admission: EM | Admit: 2019-03-22 | Discharge: 2019-03-22 | Disposition: A | Discharge status: Home / Self Care | Payer: Medicare HMO | Attending: Emergency Medicine | Admitting: Emergency Medicine

## 2019-03-22 ENCOUNTER — Telehealth: Payer: Self-pay | Admitting: Cardiology

## 2019-03-22 DIAGNOSIS — R05 Cough: Secondary | ICD-10-CM | POA: Diagnosis not present

## 2019-03-22 DIAGNOSIS — Z950 Presence of cardiac pacemaker: Secondary | ICD-10-CM | POA: Diagnosis not present

## 2019-03-22 DIAGNOSIS — I2510 Atherosclerotic heart disease of native coronary artery without angina pectoris: Secondary | ICD-10-CM | POA: Insufficient documentation

## 2019-03-22 DIAGNOSIS — E1122 Type 2 diabetes mellitus with diabetic chronic kidney disease: Secondary | ICD-10-CM | POA: Diagnosis not present

## 2019-03-22 DIAGNOSIS — R0789 Other chest pain: Secondary | ICD-10-CM

## 2019-03-22 DIAGNOSIS — I5033 Acute on chronic diastolic (congestive) heart failure: Secondary | ICD-10-CM | POA: Insufficient documentation

## 2019-03-22 DIAGNOSIS — R0602 Shortness of breath: Secondary | ICD-10-CM | POA: Insufficient documentation

## 2019-03-22 DIAGNOSIS — I130 Hypertensive heart and chronic kidney disease with heart failure and stage 1 through stage 4 chronic kidney disease, or unspecified chronic kidney disease: Secondary | ICD-10-CM | POA: Diagnosis not present

## 2019-03-22 DIAGNOSIS — N183 Chronic kidney disease, stage 3 unspecified: Secondary | ICD-10-CM | POA: Insufficient documentation

## 2019-03-22 DIAGNOSIS — Z7901 Long term (current) use of anticoagulants: Secondary | ICD-10-CM | POA: Insufficient documentation

## 2019-03-22 DIAGNOSIS — Z7984 Long term (current) use of oral hypoglycemic drugs: Secondary | ICD-10-CM | POA: Diagnosis not present

## 2019-03-22 DIAGNOSIS — Z79899 Other long term (current) drug therapy: Secondary | ICD-10-CM | POA: Insufficient documentation

## 2019-03-22 LAB — BASIC METABOLIC PANEL
Anion gap: 13 (ref 5–15)
BUN: 14 mg/dL (ref 8–23)
CO2: 24 mmol/L (ref 22–32)
Calcium: 9.2 mg/dL (ref 8.9–10.3)
Chloride: 103 mmol/L (ref 98–111)
Creatinine, Ser: 1.53 mg/dL — ABNORMAL HIGH (ref 0.44–1.00)
GFR calc Af Amer: 37 mL/min — ABNORMAL LOW (ref >60)
GFR calc non Af Amer: 32 mL/min — ABNORMAL LOW (ref >60)
Glucose, Bld: 279 mg/dL — ABNORMAL HIGH (ref 70–99)
Potassium: 4.1 mmol/L (ref 3.5–5.1)
Sodium: 140 mmol/L (ref 135–145)

## 2019-03-22 LAB — CBC
HCT: 39.9 % (ref 36.0–46.0)
Hemoglobin: 12.3 g/dL (ref 12.0–15.0)
MCH: 31.4 pg (ref 26.0–34.0)
MCHC: 30.8 g/dL (ref 30.0–36.0)
MCV: 101.8 fL — ABNORMAL HIGH (ref 80.0–100.0)
Platelets: 248 10*3/uL (ref 150–400)
RBC: 3.92 MIL/uL (ref 3.87–5.11)
RDW: 12.1 % (ref 11.5–15.5)
WBC: 5.9 10*3/uL (ref 4.0–10.5)
nRBC: 0 % (ref 0.0–0.2)

## 2019-03-22 LAB — TROPONIN I (HIGH SENSITIVITY)
Troponin I (High Sensitivity): 5 ng/L (ref <18)
Troponin I (High Sensitivity): 4 ng/L (ref <18)

## 2019-03-22 LAB — BRAIN NATRIURETIC PEPTIDE: B Natriuretic Peptide: 28.1 pg/mL (ref 0.0–100.0)

## 2019-03-22 MED ORDER — ACETAMINOPHEN 325 MG PO TABS
650.0000 mg | ORAL_TABLET | Freq: Once | ORAL | Status: AC
  Administered 2019-03-22: 650 mg via ORAL
  Filled 2019-03-22: qty 2

## 2019-03-22 MED ORDER — SODIUM CHLORIDE 0.9% FLUSH: 3.0000 mL | Freq: Once | INTRAVENOUS | Status: DC

## 2019-03-22 MED ORDER — SODIUM CHLORIDE 0.9 % IV BOLUS: 500.0000 mL | Freq: Once | INTRAVENOUS | Status: DC

## 2019-03-22 MED ORDER — ALUM & MAG HYDROXIDE-SIMETH 200-200-20 MG/5ML PO SUSP
30.0000 mL | Freq: Once | ORAL | Status: AC
  Administered 2019-03-22: 30 mL via ORAL
  Filled 2019-03-22: qty 30

## 2019-03-22 NOTE — Telephone Encounter (Signed)
Spoke with patient and patient's son. Patient reports she was in the ER on 2/9 due to a fall.   Last night patient developed chest pain and heaviness. She reports SOB and dizziness along with the chest pain.   Today patient is having reflux symptoms, a burning in her chest.   Due to patient's cardiac history and reports of recent CP and ongoing "reflux" patient was advised to seek medical attention at the nearest emergency room.   Wannetta Sender and Santiago Glad at [facility] notified that patient will be coming for evaluation.

## 2019-03-22 NOTE — Telephone Encounter (Signed)
New message  Pt c/o of Chest Pain: STAT if CP now or developed within 24 hours  1. Are you having CP right now? Yes   2. Are you experiencing any other symptoms (ex. SOB, nausea, vomiting, sweating)? Has heartburn  3. How long have you been experiencing CP? Per patient chest pain started on yesterday   4. Is your CP continuous or coming and going? continuos  5. Have you taken Nitroglycerin?no  ?

## 2019-03-22 NOTE — Discharge Instructions (Addendum)
Please follow-up with your cardiologist regarding the chest pain that you are experiencing today.  Return to ER if you develop worsening pain, difficulty breathing or other new concerning symptom.

## 2019-03-22 NOTE — Progress Notes (Signed)
PPM Remote  

## 2019-03-22 NOTE — ED Triage Notes (Signed)
Pt fell on the 8th, was seen at [facility] on the 9th-- states "I told [facility] that I thought I had a stroke, but they did not check me for that" has bruises on head--  Hx of pacemaker,  Started having chest pain 2 days ago-- shoulder pain, and neck pain.

## 2019-03-22 NOTE — ED Notes (Signed)
Spoke with the rep from [facility] about pt's pacemaker. Rep stated there was nothing to report and that pt was in sinus rhythm with no recent episode where pacemaker was used.

## 2019-03-22 NOTE — ED Provider Notes (Signed)
[place] EMERGENCY DEPARTMENT Provider Note   CSN: [id number] Arrival date & time: 03/22/19  1401     History Chief Complaint  Patient presents with  . Chest Pain  . recent fall    Tara Jackson is a 79 y.o. female.  The history is provided by the patient and medical records. No language interpreter was used.  Chest Pain  Tara Jackson is a 79 y.o. female who presents to the Emergency Department complaining of chest pain. She presents the emergency department complaining of chest pain located across her entire chest that began this morning. She has difficulty describing the pain but states that it comes and goes. Pain is better when she takes a deep breath. She did have a fall several days ago and was evaluated at that time in the emergency department with lab work and imaging, that was negative for acute disease process. She denies any fevers. She does have cough and chronic shortness of breath. Shortness of breath is slightly worse compared to baseline. She denies any nausea, vomiting, diarrhea. Symptoms are moderate and constant nature.    Past Medical History:  Diagnosis Date  . Anemia   . Anxiety   . Arthritis   . Asthma   . Brittle bone disease   . CKD (chronic kidney disease)   . Complication of anesthesia    hard to wake up after anesthesia, trouble turning head  . Coronary artery disease, non-occlusive 2014   a. minimal CAD by cath in 2014 with 40% RI stenosis. b. low-risk NST in 11/2014.  Marland Kitchen Depression   . Diabetes mellitus type 2, insulin dependent ([place])   . Diabetic neuropathy ([place])   . Diabetic retinopathy ([place])    NPDR OU  . Diastolic dysfunction, left ventricle 11/30/14   EF 55%, grade 1 DD  . DVT (deep venous thrombosis) ([place])    "18 in RLE; 7 LLE prior to PE" (03/24/2015)  . Family history of adverse reaction to anesthesia    " the whole family is hard to wake up"  . Glaucoma   . H/O hiatal hernia   . Hyperlipidemia   .  Hypertension associated with diabetes ([place])   . Hypertensive retinopathy    OU  . Hypothyroidism   . Kidney stones    "passed them"  . Macular degeneration    Exu OU  . Memory loss 06/03/2014  . On home oxygen therapy    "2L oxygen concentrator @ night"  . Osteoporosis   . Oxygen desaturation during sleep    wears 2 liters of oxygen at night   . Palpitations 35years ago   Cardionet monitor - revealed mostly normal sinus rhythm, sinus bradycardia with first-degree A-V block heart rates mostly in the 50s and 60s with some 70s. No arrhythmias, PVCs or PACs noted.  . PE (pulmonary thromboembolism) ([place]) x 3, last one was ~ 2003   history of recurrent RLE DVT with PE - last PE ~>13 yrs; Maintained on Plavix  . Pneumonia   . Presence of permanent cardiac pacemaker   . Restless legs syndrome   . Sleep apnea    cpap disontinued; test done 07/14/2008 ordered per  [place]  . Spinal headache   . Spinal stenosis    L 3, L 4 and L 5, and C 1, C 2 and C3  . Stroke [facility]) 04-19-2013   tia and stroke. Weakness Rt hand  . 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 s/p [employer]  PPM in 03/2015 by Dr. Lovena Le secondary to sinus node dysfunction.   Marland Kitchen TIA (transient ischemic attack) March 2014    Patient Active Problem List   Diagnosis Date Noted  . Chronic respiratory failure with hypoxia ([place]) 07/26/2018  . Cough variant asthma vs uacs  07/25/2018  . Syncope due to orthostatic hypotension 09/12/2017  . Orthostatic dizziness 06/12/2017  . Diabetic neuropathy ([place]) 12/13/2016  . 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 Acute renal failure superimposed on chronic kidney disease ([place]) 10/24/2016  . Neurocardiogenic syncope 10/23/2016  . Chronic anemia 10/11/2016  . Pacemaker 04/26/2015  . Chronic daily headache 04/07/2015  . Symptomatic bradycardia 03/24/2015  . Sinus node dysfunction ([place]) 03/24/2015  . Chronic diastolic heart failure ([place])   . Diabetes mellitus type 2 in obese ([place])   .  Normal coronary arteries 01/14/2015  . Normal cardiac stress test 11/30/2014  . Bradycardia 11/28/2014  . Type 2 diabetes mellitus with renal manifestations ([place]) 11/28/2014  . Morbid obesity ([place]) 11/28/2014  . CKD stage 3 due to type 2 diabetes mellitus ([place]) 11/28/2014  . Memory loss 06/03/2014  . AKI (acute kidney injury) ([place])   . Chest pain 04/19/2014  . Pleuritic chest pain 04/19/2014  . Diabetes mellitus ([place]) 04/19/2014  . Edema of both legs 04/19/2014  . Dyspnea   . DOE (dyspnea on exertion)   . Tremor of right hand 09/20/2013  . Headache(784.0) 09/19/2013  . Restless legs syndrome (RLS) 09/19/2013  . Constipation due to pain medication 08/21/2013  . Ankle edema 08/21/2013  . PVC's (premature ventricular contractions) 02/10/2013  . Diastolic dysfunction, left ventricle   . 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 PULMONARY EMBOLISM, HX OF 10/02/2009  . ALLERGIC RHINITIS 04/07/2009  . Sleep apnea 07/09/2008  . Hyperlipidemia with target LDL less than 130 05/20/2008  . Essential hypertension 05/20/2008    Past Surgical History:  Procedure Laterality Date  . ABDOMINAL HYSTERECTOMY    . APPENDECTOMY    . BACK SURGERY     steroid inj  . CATARACT EXTRACTION Bilateral   . CATARACT EXTRACTION W/ INTRAOCULAR LENS  IMPLANT, BILATERAL Bilateral 2000s  . CHOLECYSTECTOMY    . EP IMPLANTABLE DEVICE N/A 03/24/2015   [facility] PPM, Dr. Lovena Le  . ESOPHAGOGASTRODUODENOSCOPY  08/09/2011   Procedure: ESOPHAGOGASTRODUODENOSCOPY (EGD);  Surgeon: Jeryl Columbia, MD;  Location: [facility];  Service: Endoscopy;  Laterality: N/A;  . ESOPHAGOGASTRODUODENOSCOPY (EGD) WITH PROPOFOL N/A 11/12/2013   Procedure: ESOPHAGOGASTRODUODENOSCOPY (EGD) WITH PROPOFOL;  Surgeon: Jeryl Columbia, MD;  Location: [facility];  Service: Endoscopy;  Laterality: N/A;  . ESOPHAGOGASTRODUODENOSCOPY (EGD) WITH PROPOFOL N/A 05/05/2016   Procedure: ESOPHAGOGASTRODUODENOSCOPY (EGD) WITH PROPOFOL;  Surgeon: Clarene Essex, MD;   Location: [facility] ENDOSCOPY;  Service: Endoscopy;  Laterality: N/A;  . EYE SURGERY Bilateral    Cat Sx OU  . GLAUCOMA SURGERY Bilateral 2000s   "laser"  . HOT HEMOSTASIS  08/09/2011   Procedure: HOT HEMOSTASIS (ARGON PLASMA COAGULATION/BICAP);  Surgeon: Jeryl Columbia, MD;  Location: [facility];  Service: Endoscopy;  Laterality: N/A;  . HOT HEMOSTASIS N/A 11/12/2013   Procedure: HOT HEMOSTASIS (ARGON PLASMA COAGULATION/BICAP);  Surgeon: Jeryl Columbia, MD;  Location: [facility];  Service: Endoscopy;  Laterality: N/A;  . INSERT / REPLACE / REMOVE PACEMAKER  03/24/2015  . IRIDOTOMY / IRIDECTOMY Bilateral   . JOINT REPLACEMENT    . KNEE SURGERY Left done with 2nd left knee replacement   spacer bar placement  . LEFT HEART CATHETERIZATION WITH CORONARY ANGIOGRAM N/A  02/14/2012   Procedure: LEFT HEART CATHETERIZATION WITH CORONARY ANGIOGRAM;  Surgeon: Leonie Man, MD;  Location: [facility] CATH LAB;  Service: Cardiovascular:: no evidence of obstructive coronary disease ,low EDP with normal EF  . LEV DOPPLER  05/29/2012   RIGHT EXTREM. NORMAL VENOUS DUPLEX  . NM MYOCAR PERF WALL MOTION  01/26/2012; 11/2014   a. EF 79%,LV normal,ST SEGMENT CHANGE SUGGESTIVE OF ISCHEMIA.; LOW RISK, EF 60%. No ischemia or infarction. No wall motion abnormality   . PACEMAKER PLACEMENT    . REVISION TOTAL KNEE ARTHROPLASTY Left   . TONSILLECTOMY    . TOTAL KNEE ARTHROPLASTY Bilateral   . TRANSTHORACIC ECHOCARDIOGRAM  11/2017   11/2017: EF 60-65%. Gr1 DD. PAP ~33 mmHg.  Mild TR. PPM leads in RA & RV.   Marland Kitchen TRANSTHORACIC ECHOCARDIOGRAM  2014; October 2016   a. EF 47-09%; normal diastolic pressures, no regional wall motion motion abnormalities;; b/ F 55-60%. Normal wall motion. GR 1 DD. No valve lesions  . TRIGGER FINGER RELEASE  11/22/2011   Procedure: RELEASE TRIGGER FINGER/A-1 PULLEY;  Surgeon: Meredith Pel, MD;  Location: [facility];  Service: Orthopedics;  Laterality: Left;  Left trigger thumb release  . TRIGGER FINGER RELEASE  Right yrs ago     OB History   No obstetric history on file.     Family History  Problem Relation Age of Onset  . Cancer Mother   . Diabetes Mother   . Cancer - Prostate Father   . Diabetes Father   . Retinal detachment Son   . Diabetes Sister   . Diabetes Brother   . Diabetes Paternal Grandmother     Social History   Tobacco Use  . Smoking status: Never Smoker  . Smokeless tobacco: Never Used  Substance Use Topics  . Alcohol use: No    Alcohol/week: 0.0 standard drinks  . Drug use: No    Home Medications Prior to Admission medications   Medication Sig Start Date End Date Taking? Authorizing Provider  acetaminophen (TYLENOL) 650 MG CR tablet Take 650 mg by mouth every 8 (eight) hours as needed for pain.   Yes [provider]  albuterol (PROVENTIL HFA;VENTOLIN HFA) 108 (90 BASE) MCG/ACT inhaler Inhale 2 puffs into the lungs every 6 (six) hours as needed for wheezing.   Yes [provider]  budesonide (PULMICORT) 0.25 MG/2ML nebulizer solution One vial twice daily Patient taking differently: Take 0.25 mg by nebulization 2 (two) times daily as needed (shortness of breath).  12/21/18  Yes Tanda Rockers, MD  busPIRone (BUSPAR) 10 MG tablet Take 1 tablet (10 mg total) by mouth every morning. 06/03/14  Yes Garvin Fila, MD  Cholecalciferol (VITAMIN D-3 PO) Take 2 tablets by mouth every morning.    Yes [provider]  clonazePAM (KLONOPIN) 0.5 MG tablet Take 0.5-1 mg by mouth See admin instructions. Takes 2 tabs in am and 1 tab in pm.   Yes [provider]  clopidogrel (PLAVIX) 75 MG tablet Take 75 mg by mouth every morning.   Yes [provider]  cyanocobalamin 100 MCG tablet Take 100 mcg by mouth daily.   Yes [provider]  dicyclomine (BENTYL) 10 MG capsule Take 1 capsule (10 mg total) by mouth 3 (three) times daily before meals. 09/14/17  Yes Vann, Jessica U, DO  donepezil (ARICEPT) 10 MG tablet TAKE 1 TABLET BY MOUTH  EVERYDAY AT BEDTIME Patient taking differently: Take 10 mg by mouth at bedtime.  10/01/18  Yes Leonie Man,  Lucy [place], MD  formoterol (PERFOROMIST) 20 MCG/2ML nebulizer solution Take 2 mLs (20 mcg total) by nebulization 2 (two) times daily. Use in nebulizer twice daily perfectly regularly 12/21/18  Yes Tanda Rockers, MD  glipiZIDE (GLUCOTROL) 10 MG tablet Take 20 mg by mouth 2 (two) times daily before a meal.  05/03/14  Yes [provider]  guaiFENesin (MUCINEX) 600 MG 12 hr tablet Take 600 mg by mouth 2 (two) times daily as needed for cough.   Yes [provider]  insulin NPH (HUMULIN N,NOVOLIN N) 100 UNIT/ML injection Inject 0-20 Units into the skin See admin instructions. Novolin-N Give 20 unit in morning; give  5 units at 4pm; evening dose as needed.  If less than 150 blood sugar= 0units.  If greater than 200 blood sugar= give 10units to decrease by 100 blood sugar   Yes [provider]  insulin regular (NOVOLIN R,HUMULIN R) 100 units/mL injection Inject 0-25 Units into the skin See admin instructions. Give 25 unit in morning; give 20units at 4pm; evening dose as needed.  If less than 150 blood sugar= 0units.  If greater than 200 blood sugar= give 10units to decrease by 100 blood sugar   Yes [provider]  LEVOXYL 75 MCG tablet Take 75 mcg by mouth daily. 02/12/19  Yes [provider]  lidocaine (LIDODERM) 5 % Place 3 patches onto the skin daily as needed (pain). Remove & Discard patch within 12 hours or as directed by MD   Yes [provider]  linaclotide (LINZESS) 290 MCG CAPS capsule Take 290 mcg by mouth daily before breakfast.   Yes [provider]  LORazepam (ATIVAN) 0.5 MG tablet Take 0.5 mg by mouth daily as needed for anxiety.   Yes [provider]  Menthol-Methyl Salicylate (MUSCLE RUB) 10-15 % CREA Apply 1 application topically as needed for muscle pain.   Yes [provider]  midodrine (PROAMATINE) 2.5 MG tablet  Take 1 tablet (2.5 mg total) by mouth 3 (three) times daily with meals. Patient taking differently: Take 2.5 mg by mouth 3 (three) times daily with meals. As needed if blood pressure goes to low. 10/19/17  Yes Leonie Man, MD  nortriptyline (PAMELOR) 50 MG capsule TAKE 2 CAPSULES (100MG ) AT BEDTIME Patient taking differently: Take 50 mg by mouth 2 (two) times daily.  06/02/16  Yes Garvin Fila, MD  ondansetron (ZOFRAN) 4 MG tablet Take 4 mg by mouth as needed for nausea or vomiting.  08/16/17  Yes [provider]  OXYGEN Inhale 2 L into the lungs as needed. Used at overnight with a concentrator    Yes [provider]  polyethylene glycol (MIRALAX / GLYCOLAX) packet Take 17 g by mouth daily as needed for mild constipation.    Yes [provider]  potassium chloride SA (K-DUR,KLOR-CON) 20 MEQ tablet Take 1 tablet (20 mEq total) by mouth 2 (two) times daily. 06/22/15  Yes Davonna Belling, MD  predniSONE (DELTASONE) 10 MG tablet TAKE 2 TABS EVERY MORNING UNTIL 100% BETTER THEN TAKE 1 TAB EVERY DAY FOR 1 WEEK THEN 1/2 TAB DAILY 02/25/19  Yes Tanda Rockers, MD  Probiotic Product (ALIGN) 4 MG CAPS Take 1 capsule by mouth daily.   Yes [provider]  rosuvastatin (CRESTOR) 20 MG tablet Take 20 mg by mouth daily.  03/13/17  Yes [provider]  solifenacin (VESICARE) 5 MG tablet Take 5 mg by mouth daily. 02/24/19  Yes [provider]  terconazole [place]  7) 0.4 % vaginal cream Place 1 applicator vaginally daily as needed for itching. 02/12/19  Yes [provider]  esomeprazole (NEXIUM) 40 MG capsule Take 30-60 min before first meal of the day Patient not taking: Reported on 03/22/2019 07/25/18   Tanda Rockers, MD  famotidine (PEPCID) 20 MG tablet One after supper Patient not taking: Reported on 03/19/2019 07/25/18   Tanda Rockers, MD  furosemide (LASIX) 20 MG tablet Take 1 tablet (20 mg total) by mouth daily as needed for fluid or edema.  09/14/17 03/19/19  Geradine Girt, DO    Allergies    Iodine, Iohexol, Metoclopramide hcl, Sulfa antibiotics, Lactose intolerance (gi), and Lansoprazole  Review of Systems   Review of Systems  Cardiovascular: Positive for chest pain.  All other systems reviewed and are negative.   Physical Exam Updated Vital Signs BP (!) 130/46 (BP Location: Right Arm)   Pulse 60   Temp 97.6 F (36.4 C) (Oral)   Resp (!) 22   SpO2 99%   Physical Exam Vitals and nursing note reviewed.  Constitutional:      Appearance: She is well-developed.  HENT:     Head: Normocephalic and atraumatic.  Cardiovascular:     Rate and Rhythm: Normal rate and regular rhythm.     Heart sounds: No murmur.  Pulmonary:     Effort: Pulmonary effort is normal. No respiratory distress.     Breath sounds: Normal breath sounds.  Abdominal:     Palpations: Abdomen is soft.     Tenderness: There is no abdominal tenderness. There is no guarding or rebound.  Musculoskeletal:     Comments: 2+ DP pulses bilaterally. There is ecchymosis to the right thumb without any significant swelling. There is moderate edema with mild ecchymosis to the right dorsal foot. 2+ DP pulses bilaterally. There is mild tenderness to the dorsal foot. There is trace bilateral ankle edema.  Skin:    General: Skin is warm and dry.  Neurological:     Mental Status: She is alert and oriented to person, place, and time.     Comments: Five out of five strength in all four extremities with sensation to light touch intact in all four extremities.  Psychiatric:        Behavior: Behavior normal.     ED Results / Procedures / Treatments   Labs (all labs ordered are listed, but only abnormal results are displayed) Labs Reviewed  BASIC METABOLIC PANEL - Abnormal; Notable for the following components:      Result Value   Glucose, Bld 279 (*)    Creatinine, Ser 1.53 (*)    GFR calc non Af Amer 32 (*)    GFR calc Af Amer 37 (*)    All other components  within normal limits  CBC - Abnormal; Notable for the following components:   MCV 101.8 (*)    All other components within normal limits  BRAIN NATRIURETIC PEPTIDE  TROPONIN I (HIGH SENSITIVITY)  TROPONIN I (HIGH SENSITIVITY)    EKG EKG Interpretation  Date/Time:  Friday March 22 2019 14:06:37 EST Ventricular Rate:  65 PR Interval:  222 QRS Duration: 94 QT Interval:  434 QTC Calculation: 451 R Axis:   80 Text Interpretation: Sinus rhythm with 1st degree A-V block Low voltage QRS Cannot rule out Anterior infarct , age undetermined T wave abnormality, consider inferolateral ischemia Abnormal ECG since 03/19/19, no changes Confirmed by Noemi Chapel [phone]) on 03/22/2019 2:14:21 PM   Radiology DG Chest  2 View  Result Date: 03/22/2019 CLINICAL DATA:  Chest pain EXAM: CHEST - 2 VIEW COMPARISON:  03/19/2019 FINDINGS: There is a dual lead cardiac pacemaker. There is no focal consolidation. There is no pleural effusion or pneumothorax. The heart and mediastinal contours are unremarkable. There is no acute osseous abnormality. IMPRESSION: No active cardiopulmonary disease. Electronically Signed   By: Kathreen Devoid   On: 03/22/2019 14:58   CUP PACEART REMOTE DEVICE CHECK  Result Date: 03/21/2019 Scheduled remote reviewed.  Normal device function.  Next remote 91 days. Kathy Breach, RN, CCDS, [employer]   Procedures Procedures (including critical care time)  Medications Ordered in ED Medications  sodium chloride flush (NS) 0.9 % injection 3 mL (has no administration in time range)  alum & mag hydroxide-simeth (MAALOX/MYLANTA) 200-200-20 MG/5ML suspension 30 mL (has no administration in time range)  acetaminophen (TYLENOL) tablet 650 mg (has no administration in time range)    ED Course  I have reviewed the triage vital signs and the nursing notes.  Pertinent labs & imaging results that were available during my care of the patient were reviewed by me and considered in my  medical decision making (see chart for details).    MDM Rules/Calculators/A&P                     Patient here for evaluation of chest pain that began today, no change with activity. She is anxious but non-toxic appearing on evaluation. Feel that ACS is very unlikely in setting of her symptoms. Doubt PE. EKG unchanged when compared to priors. Patient did have a low presenting blood pressure that is not consistent with her current clinical picture. Will check orthostatic vital signs. Patient care transferred pending repeat troponin, orthostatics.  Final Clinical Impression(s) / ED Diagnoses Final diagnoses:  None    Rx / DC Orders ED Discharge Orders    None       Quintella Reichert, MD 03/22/19 1707

## 2019-03-25 NOTE — Progress Notes (Deleted)
Cardiology Office Note   Date:  03/25/2019   ID:  [name], [name] 1940-08-18, MRN [id number]  PCP:  Shon Baton, MD  Cardiologist:  Dr. Ellyn Hack  No chief complaint on file.    History of Present Illness: Tara Jackson is a 79 y.o. female who presents for ongoing assessment and management of bradycardia, status post permanent pacemaker implantation, ([employer] implanted 03/2015 by Dr. Lovena Le), history of PEs on longstanding Plavix after prolonged warfarin, hypertension, history of hypothyroidism, hyperlipidemia, O2 dependent at night.  Frequent palpitations, history of TIA..  Was last seen by Dr. Ellyn Hack on 02/20/2018, with complaints of dizziness which can occur with either walking or standing up quickly.  She rarely uses midodrine.  She has had a couple of falls from poor balance.  She also complained of excessive thirst.  She only takes Lasix every couple of weeks, she is no longer on antihypertensive medications due to hypotension after admission in August 2020.  She was not very active as she has significant arthritis and obesity, and therefore does have some exertional dyspnea at times.  On last office visit, no medication changes were made.  He did not wish to add blood pressure medication to allow for permissive hypertension and avoidance of orthostatic hypotension.  Past Medical History:  Diagnosis Date  . Anemia   . Anxiety   . Arthritis   . Asthma   . Brittle bone disease   . CKD (chronic kidney disease)   . Complication of anesthesia    hard to wake up after anesthesia, trouble turning head  . Coronary artery disease, non-occlusive 2014   a. minimal CAD by cath in 2014 with 40% RI stenosis. b. low-risk NST in 11/2014.  Marland Kitchen Depression   . Diabetes mellitus type 2, insulin dependent ([place])   . Diabetic neuropathy ([place])   . Diabetic retinopathy ([place])    NPDR OU  . Diastolic dysfunction, left ventricle 11/30/14   EF 55%, grade 1 DD  . DVT (deep venous thrombosis)  ([place])    "18 in RLE; 7 LLE prior to PE" (03/24/2015)  . Family history of adverse reaction to anesthesia    " the whole family is hard to wake up"  . Glaucoma   . H/O hiatal hernia   . Hyperlipidemia   . Hypertension associated with diabetes ([place])   . Hypertensive retinopathy    OU  . Hypothyroidism   . Kidney stones    "passed them"  . Macular degeneration    Exu OU  . Memory loss 06/03/2014  . On home oxygen therapy    "2L oxygen concentrator @ night"  . Osteoporosis   . Oxygen desaturation during sleep    wears 2 liters of oxygen at night   . Palpitations 35years ago   Cardionet monitor - revealed mostly normal sinus rhythm, sinus bradycardia with first-degree A-V block heart rates mostly in the 50s and 60s with some 70s. No arrhythmias, PVCs or PACs noted.  . PE (pulmonary thromboembolism) ([place]) x 3, last one was ~ 2003   history of recurrent RLE DVT with PE - last PE ~>13 yrs; Maintained on Plavix  . Pneumonia   . Presence of permanent cardiac pacemaker   . Restless legs syndrome   . Sleep apnea    cpap disontinued; test done 07/14/2008 ordered per  [place]  . Spinal headache   . Spinal stenosis    L 3, L 4 and L 5, and C 1, C 2 and  C3  . Stroke ([place]) 04-19-2013   tia and stroke. Weakness Rt hand  . 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 s/p [employer] PPM in 03/2015 by Dr. Lovena Le secondary to sinus node dysfunction.   Marland Kitchen TIA (transient ischemic attack) March 2014    Past Surgical History:  Procedure Laterality Date  . ABDOMINAL HYSTERECTOMY    . APPENDECTOMY    . BACK SURGERY     steroid inj  . CATARACT EXTRACTION Bilateral   . CATARACT EXTRACTION W/ INTRAOCULAR LENS  IMPLANT, BILATERAL Bilateral 2000s  . CHOLECYSTECTOMY    . EP IMPLANTABLE DEVICE N/A 03/24/2015   [facility] [employer] PPM, Dr. Lovena Le  . ESOPHAGOGASTRODUODENOSCOPY  08/09/2011   Procedure: ESOPHAGOGASTRODUODENOSCOPY (EGD);  Surgeon: Jeryl Columbia, MD;  Location: [facility];  Service: Endoscopy;   Laterality: N/A;  . ESOPHAGOGASTRODUODENOSCOPY (EGD) WITH PROPOFOL N/A 11/12/2013   Procedure: ESOPHAGOGASTRODUODENOSCOPY (EGD) WITH PROPOFOL;  Surgeon: Jeryl Columbia, MD;  Location: [facility] ENDOSCOPY;  Service: Endoscopy;  Laterality: N/A;  . ESOPHAGOGASTRODUODENOSCOPY (EGD) WITH PROPOFOL N/A 05/05/2016   Procedure: ESOPHAGOGASTRODUODENOSCOPY (EGD) WITH PROPOFOL;  Surgeon: Clarene Essex, MD;  Location: [facility] ENDOSCOPY;  Service: Endoscopy;  Laterality: N/A;  . EYE SURGERY Bilateral    Cat Sx OU  . GLAUCOMA SURGERY Bilateral 2000s   "laser"  . HOT HEMOSTASIS  08/09/2011   Procedure: HOT HEMOSTASIS (ARGON PLASMA COAGULATION/BICAP);  Surgeon: Jeryl Columbia, MD;  Location: [facility];  Service: Endoscopy;  Laterality: N/A;  . HOT HEMOSTASIS N/A 11/12/2013   Procedure: HOT HEMOSTASIS (ARGON PLASMA COAGULATION/BICAP);  Surgeon: Jeryl Columbia, MD;  Location: [facility] ENDOS[facility];  Service: Endoscopy;  Laterality: N/A;  . INSERT / REPLACE / REMOVE PACEMAKER  03/24/2015  . IRIDOTOMY / IRIDECTOMY Bilateral   . JOINT REPLACEMENT    . KNEE SURGERY Left done with 2nd left knee replacement   spacer bar placement  . LEFT HEART CATHETERIZATION WITH CORONARY ANGIOGRAM N/A 02/14/2012   Procedure: LEFT HEART CATHETERIZATION WITH CORONARY ANGIOGRAM;  Surgeon: Leonie Man, MD;  Location[facility] C[facility] LAB;  Service: Cardiovascular:: no evidence of obstructive coronary disease ,low EDP with normal EF  . LEV DOPPLER  05/29/2012   RIGHT EXTREM. NORMAL VENOUS DUPLEX  . NM MYOCAR PERF WALL MOTION  01/26/2012; 11/2014   a. EF 79%,LV normal,ST SEGMENT CHANGE SUGGESTIVE OF ISCHEMIA.; LOW RISK, EF 60%. No ischemia or infarction. No wall motion abnormality   . PACEMAKER PLACEMENT    . REVISION TOTAL KNEE ARTHROPLASTY Left   . TONSILLECTOMY    . TOTAL KNEE ARTHROPLASTY Bilateral   . TRANSTHORACIC ECHOCARDIOGRAM  11/2017   11/2017: EF 60-65%. Gr1 DD. PAP ~33 mmHg.  Mild TR. PPM leads in RA & RV.   Marland Kitchen TRANSTHORACIC ECHOCARDIOGRAM  2014; October 2016    a. EF 82-99%; normal diastolic pressures, no regional wall motion motion abnormalities;; b/ F 55-60%. Normal wall motion. GR 1 DD. No valve lesions  . TRIGGER FINGER RELEASE  11/22/2011   Procedure: RELEASE TRIGGER FINGER/A-1 PULLEY;  Surgeon: Meredith Pel, MD;  Location: [facility];  Service: Orthopedics;  Laterality: Left;  Left trigger thumb release  . TRIGGER FINGER RELEASE Right yrs ago     Current Outpatient Medications  Medication Sig Dispense Refill  . acetaminophen (TYLENOL) 650 MG CR tablet Take 650 mg by mouth every 8 (eight) hours as needed for pain.    Marland Kitchen albuterol (PROVENTIL HFA;VENTOLIN HFA) 108 (90 BASE) MCG/ACT inhaler Inhale 2 puffs into the lungs every 6 (six) hours as needed for  wheezing.    . budesonide (PULMICORT) 0.25 MG/2ML nebulizer solution One vial twice daily (Patient taking differently: Take 0.25 mg by nebulization 2 (two) times daily as needed (shortness of breath). ) 120 mL 12  . busPIRone (BUSPAR) 10 MG tablet Take 1 tablet (10 mg total) by mouth every morning. 60 tablet 1  . Cholecalciferol (VITAMIN D-3 PO) Take 2 tablets by mouth every morning.     . clonazePAM (KLONOPIN) 0.5 MG tablet Take 0.5-1 mg by mouth See admin instructions. Takes 2 tabs in am and 1 tab in pm.    . clopidogrel (PLAVIX) 75 MG tablet Take 75 mg by mouth every morning.    . cyanocobalamin 100 MCG tablet Take 100 mcg by mouth daily.    Marland Kitchen dicyclomine (BENTYL) 10 MG capsule Take 1 capsule (10 mg total) by mouth 3 (three) times daily before meals.    . donepezil (ARICEPT) 10 MG tablet TAKE 1 TABLET BY MOUTH EVERYDAY AT BEDTIME (Patient taking differently: Take 10 mg by mouth at bedtime. ) 90 tablet 1  . esomeprazole (NEXIUM) 40 MG capsule Take 30-60 min before first meal of the day (Patient not taking: Reported on 03/22/2019) 30 capsule 11  . famotidine (PEPCID) 20 MG tablet One after supper (Patient not taking: Reported on 03/19/2019) 30 tablet 11  . formoterol (PERFOROMIST) 20 MCG/2ML  nebulizer solution Take 2 mLs (20 mcg total) by nebulization 2 (two) times daily. Use in nebulizer twice daily perfectly regularly 120 mL 11  . furosemide (LASIX) 20 MG tablet Take 1 tablet (20 mg total) by mouth daily as needed for fluid or edema. 90 tablet 3  . glipiZIDE (GLUCOTROL) 10 MG tablet Take 20 mg by mouth 2 (two) times daily before a meal.     . guaiFENesin (MUCINEX) 600 MG 12 hr tablet Take 600 mg by mouth 2 (two) times daily as needed for cough.    . insulin NPH (HUMULIN N,NOVOLIN N) 100 UNIT/ML injection Inject 0-20 Units into the skin See admin instructions. Novolin-N Give 20 unit in morning; give  5 units at 4pm; evening dose as needed.  If less than 150 blood sugar= 0units.  If greater than 200 blood sugar= give 10units to decrease by 100 blood sugar    . insulin regular (NOVOLIN R,HUMULIN R) 100 units/mL injection Inject 0-25 Units into the skin See admin instructions. Give 25 unit in morning; give 20units at 4pm; evening dose as needed.  If less than 150 blood sugar= 0units.  If greater than 200 blood sugar= give 10units to decrease by 100 blood sugar    . LEVOXYL 75 MCG tablet Take 75 mcg by mouth daily.    Marland Kitchen lidocaine (LIDODERM) 5 % Place 3 patches onto the skin daily as needed (pain). Remove & Discard patch within 12 hours or as directed by MD    . linaclotide (LINZESS) 290 MCG CAPS capsule Take 290 mcg by mouth daily before breakfast.    . LORazepam (ATIVAN) 0.5 MG tablet Take 0.5 mg by mouth daily as needed for anxiety.    . Menthol-Methyl Salicylate (MUSCLE RUB) 10-15 % CREA Apply 1 application topically as needed for muscle pain.    . midodrine (PROAMATINE) 2.5 MG tablet Take 1 tablet (2.5 mg total) by mouth 3 (three) times daily with meals. (Patient taking differently: Take 2.5 mg by mouth 3 (three) times daily with meals. As needed if blood pressure goes to low.) 90 tablet 6  . nortriptyline (PAMELOR) 50 MG capsule TAKE 2  CAPSULES (100MG ) AT BEDTIME (Patient taking  differently: Take 50 mg by mouth 2 (two) times daily. ) 180 capsule 1  . ondansetron (ZOFRAN) 4 MG tablet Take 4 mg by mouth as needed for nausea or vomiting.   0  . OXYGEN Inhale 2 L into the lungs as needed. Used at overnight with a concentrator     . polyethylene glycol (MIRALAX / GLYCOLAX) packet Take 17 g by mouth daily as needed for mild constipation.     . potassium chloride SA (K-DUR,KLOR-CON) 20 MEQ tablet Take 1 tablet (20 mEq total) by mouth 2 (two) times daily. 10 tablet 0  . predniSONE (DELTASONE) 10 MG tablet TAKE 2 TABS EVERY MORNING UNTIL 100% BETTER THEN TAKE 1 TAB EVERY DAY FOR 1 WEEK THEN 1/2 TAB DAILY 100 tablet 1  . Probiotic Product (ALIGN) 4 MG CAPS Take 1 capsule by mouth daily.    . rosuvastatin (CRESTOR) 20 MG tablet Take 20 mg by mouth daily.     . solifenacin (VESICARE) 5 MG tablet Take 5 mg by mouth daily.    Marland Kitchen terconazole (TERAZOL 7) 0.4 % vaginal cream Place 1 applicator vaginally daily as needed for itching.     No current facility-administered medications for this visit.    Allergies:   Iodine, Iohexol, Metoclopramide hcl, Sulfa antibiotics, Lactose intolerance (gi), and Lansoprazole    Social History:  The patient  reports that she has never smoked. She has never used smokeless tobacco. She reports that she does not drink alcohol or use drugs.   Family History:  The patient's family history includes Cancer in her mother; Cancer - Prostate in her father; Diabetes in her brother, father, mother, paternal grandmother, and sister; Retinal detachment in her son.    ROS: All other systems are reviewed and negative. Unless otherwise mentioned in H&P    PHYSICAL EXAM: VS:  There were no vitals taken for this visit. , BMI There is no height or weight on file to calculate BMI. GEN: Well nourished, well developed, in no acute distress HEENT: normal Neck: no JVD, carotid bruits, or masses Cardiac: ***RRR; no murmurs, rubs, or gallops,no edema  Respiratory:  Clear  to auscultation bilaterally, normal work of breathing GI: soft, nontender, nondistended, + BS MS: no deformity or atrophy Skin: warm and dry, no rash Neuro:  Strength and sensation are intact Psych: euthymic mood, full affect   EKG:  EKG {ACTION; IS/IS VCB:[id number]} ordered today. The ekg ordered today demonstrates ***   Recent Labs: 03/19/2019: ALT 12 03/22/2019: B Natriuretic Peptide 28.1; BUN 14; Creatinine, Ser 1.53; Hemoglobin 12.3; Platelets 248; Potassium 4.1; Sodium 140    Lipid Panel    Component Value Date/Time   CHOL 125 04/20/2012 0514   TRIG 168 (H) 04/20/2012 0514   HDL 49 04/20/2012 0514   CHOLHDL 2.6 04/20/2012 0514   VLDL 34 04/20/2012 0514   LDLCALC 42 04/20/2012 0514      Wt Readings from Last 3 Encounters:  03/19/19 260 lb (117.9 kg)  01/15/19 262 lb (118.8 kg)  12/18/18 259 lb (117.5 kg)      Other studies Reviewed: Additional studies/ records that were reviewed today include: ***. Review of the above records demonstrates: ***   ASSESSMENT AND PLAN:  1.  ***   Current medicines are reviewed at length with the patient today.    Labs/ tests ordered today include: *** Phill Myron. [place] Pugh, ANP, AACC   03/25/2019 10:02 AM    [facility]  [place] Office [phone] Fax [phone]  Notice: This dictation was prepared with Dragon dictation along with smaller phrase technology. Any transcriptional errors that result from this process are unintentional and may not be corrected upon review.

## 2019-03-27 ENCOUNTER — Ambulatory Visit: Payer: Medicare HMO | Admitting: Adult Health

## 2019-04-03 ENCOUNTER — Ambulatory Visit: Payer: Medicare HMO | Attending: Internal Medicine

## 2019-04-03 DIAGNOSIS — Z20822 Contact with and (suspected) exposure to covid-19: Secondary | ICD-10-CM

## 2019-04-04 LAB — NOVEL CORONAVIRUS, NAA: SARS-CoV-2, NAA: NOT DETECTED

## 2019-04-10 ENCOUNTER — Other Ambulatory Visit: Payer: Self-pay | Admitting: Neurology

## 2019-04-17 ENCOUNTER — Encounter: Payer: Self-pay | Admitting: Adult Health

## 2019-04-17 ENCOUNTER — Ambulatory Visit: Payer: Medicare HMO | Admitting: Adult Health

## 2019-04-17 ENCOUNTER — Other Ambulatory Visit: Payer: Self-pay

## 2019-04-17 VITALS — BP 141/69 | HR 61 | Ht 67.0 in | Wt 255.8 lb

## 2019-04-17 DIAGNOSIS — E1121 Type 2 diabetes mellitus with diabetic nephropathy: Secondary | ICD-10-CM

## 2019-04-17 DIAGNOSIS — I63139 Cerebral infarction due to embolism of unspecified carotid artery: Secondary | ICD-10-CM

## 2019-04-17 DIAGNOSIS — I10 Essential (primary) hypertension: Secondary | ICD-10-CM

## 2019-04-17 DIAGNOSIS — R072 Precordial pain: Secondary | ICD-10-CM

## 2019-04-17 DIAGNOSIS — E785 Hyperlipidemia, unspecified: Secondary | ICD-10-CM

## 2019-04-17 DIAGNOSIS — R001 Bradycardia, unspecified: Secondary | ICD-10-CM | POA: Diagnosis not present

## 2019-04-17 DIAGNOSIS — Z950 Presence of cardiac pacemaker: Secondary | ICD-10-CM

## 2019-04-17 NOTE — Patient Instructions (Addendum)
Medication Instructions:  NONE ordered at this time of appointment   *If you need a refill on your cardiac medications before your next appointment, please call your pharmacy*  Lab Work: NONE ordered at this time of appointment   If you have labs (blood work) drawn today and your tests are completely normal, you will receive your results only by: Marland Kitchen MyChart Message (if you have MyChart) OR . A paper copy in the mail If you have any lab test that is abnormal or we need to change your treatment, we will call you to review the results.  Testing/Procedures: NONE ordered at this time of appointment   Follow-Up: At [facility], you and your health needs are our priority.  As part of our continuing mission to provide you with exceptional heart care, we have created designated Provider Care Teams.  These Care Teams include your primary Cardiologist (physician) and Advanced Practice Providers (APPs -  Physician Assistants and Nurse Practitioners) who all work together to provide you with the care you need, when you need it.  Your next appointment:   6 month(s)  The format for your next appointment:   In Person  Provider:   Glenetta Hew, MD  Other Instructions

## 2019-04-17 NOTE — Progress Notes (Signed)
Cardiology Office Note   Date:  04/17/2019   ID:  Tara Jackson, Tara Jackson 04-14-1940, MRN [id number]  PCP:  Shon Baton, MD  Cardiologist:   No chief complaint on file.    History of Present Illness: Tara Jackson is a 79 y.o. 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 Other history includes Type II diabetes with neuropathy, hypothyroidism,  anxiety, arthritis, asthma, depression,   She was seen in the ED on 03/22/2019 complaining of dyspnea and chest pain across her entire chest. Pain was coming and going. Better when she takes a deep breath.  She comes today with multiple somatic complaints from sticking chest pain to muscle aches and pains, feeling her heart irregular, lower extremity edema, back pain, unsteadiness on her feet.  Her husband accompanies her.  She feels like her pacemaker site is to sore.  She states that her blood pressure machine is telling her that her heart rate is irregular.  She is evasive when I asked her about low-sodium diet.   Past Medical History:  Diagnosis Date  . Anemia   . Anxiety   . Arthritis   . Asthma   . Brittle bone disease   . CKD (chronic kidney disease)   . Complication of anesthesia    hard to wake up after anesthesia, trouble turning head  . Coronary artery disease, non-occlusive 2014   a. minimal CAD by cath in 2014 with 40% RI stenosis. b. low-risk NST in 11/2014.  Marland Kitchen Depression   . Diabetes mellitus type 2, insulin dependent ([place])   . Diabetic neuropathy ([place])   . Diabetic retinopathy ([place])    NPDR OU  . Diastolic dysfunction, left ventricle 11/30/14   EF 55%, grade 1 DD  . DVT (deep venous thrombosis) ([place])    "18 in RLE; 7 LLE prior to PE" (03/24/2015)  . Family history of adverse reaction to anesthesia    " the whole family is hard to wake up"  . Glaucoma   . H/O hiatal  hernia   . Hyperlipidemia   . Hypertension associated with diabetes ([place])   . Hypertensive retinopathy    OU  . Hypothyroidism   . Kidney stones    "passed them"  . Macular degeneration    Exu OU  . Memory loss 06/03/2014  . On home oxygen therapy    "2L oxygen concentrator @ night"  . Osteoporosis   . Oxygen desaturation during sleep    wears 2 liters of oxygen at night   . Palpitations 35years ago   Cardionet monitor - revealed mostly normal sinus rhythm, sinus bradycardia with first-degree A-V block heart rates mostly in the 50s and 60s with some 70s. No arrhythmias, PVCs or PACs noted.  . PE (pulmonary thromboembolism) ([place]) x 3, last one was ~ 2003   history of recurrent RLE DVT with PE - last PE ~>13 yrs; Maintained on Plavix  . Pneumonia   . Presence of permanent cardiac pacemaker   . Restless legs syndrome   . Sleep apnea    cpap disontinued; test done 07/14/2008 ordered per  [place]  . Spinal headache   . Spinal stenosis    L 3, L 4 and L 5, and C 1, C 2 and C3  . Stroke [facility]) 04-19-2013   tia and stroke. Weakness Rt hand  . Symptomatic bradycardia 03/24/2015  a. s/p [employer] PPM in 03/2015 by Dr. Lovena Le secondary to sinus node dysfunction.   Marland Kitchen TIA (transient ischemic attack) March 2014    Past Surgical History:  Procedure Laterality Date  . ABDOMINAL HYSTERECTOMY    . APPENDECTOMY    . BACK SURGERY     steroid inj  . CATARACT EXTRACTION Bilateral   . CATARACT EXTRACTION W/ INTRAOCULAR LENS  IMPLANT, BILATERAL Bilateral 2000s  . CHOLECYSTECTOMY    . EP IMPLANTABLE DEVICE N/A 03/24/2015   [facility] PPM, Dr. Lovena Le  . ESOPHAGOGASTRODUODENOSCOPY  08/09/2011   Procedure: ESOPHAGOGASTRODUODENOSCOPY (EGD);  Surgeon: Jeryl Columbia, MD;  Location: [facility];  Service: Endoscopy;  Laterality: N/A;  . ESOPHAGOGASTRODUODENOSCOPY (EGD) WITH PROPOFOL N/A 11/12/2013   Procedure: ESOPHAGOGASTRODUODENOSCOPY (EGD) WITH PROPOFOL;  Surgeon: Jeryl Columbia, MD;   Location: [facility] ENDOSCOPY;  Service: Endoscopy;  Laterality: N/A;  . ESOPHAGOGASTRODUODENOSCOPY (EGD) WITH PROPOFOL N/A 05/05/2016   Procedure: ESOPHAGOGASTRODUODENOSCOPY (EGD) WITH PROPOFOL;  Surgeon: Clarene Essex, MD;  Location: [facility] ENDOSCOPY;  Service: Endoscopy;  Laterality: N/A;  . EYE SURGERY Bilateral    Cat Sx OU  . GLAUCOMA SURGERY Bilateral 2000s   "laser"  . HOT HEMOSTASIS  08/09/2011   Procedure: HOT HEMOSTASIS (ARGON PLASMA COAGULATION/BICAP);  Surgeon: Jeryl Columbia, MD;  Location: [facility];  Service: Endoscopy;  Laterality: N/A;  . HOT HEMOSTASIS N/A 11/12/2013   Procedure: HOT HEMOSTASIS (ARGON PLASMA COAGULATION/BICAP);  Surgeon: Jeryl Columbia, MD;  Location: [facility];  Service: Endoscopy;  Laterality: N/A;  . INSERT / REPLACE / REMOVE PACEMAKER  03/24/2015  . IRIDOTOMY / IRIDECTOMY Bilateral   . JOINT REPLACEMENT    . KNEE SURGERY Left done with 2nd left knee replacement   spacer bar placement  . LEFT HEART CATHETERIZATION WITH CORONARY ANGIOGRAM N/A 02/14/2012   Procedure: LEFT HEART CATHETERIZATION WITH CORONARY ANGIOGRAM;  Surgeon: Leonie Man, MD;  Location: [facility] CATH LAB;  Service: Cardiovascular:: no evidence of obstructive coronary disease ,low EDP with normal EF  . LEV DOPPLER  05/29/2012   RIGHT EXTREM. NORMAL VENOUS DUPLEX  . NM MYOCAR PERF WALL MOTION  01/26/2012; 11/2014   a. EF 79%,LV normal,ST SEGMENT CHANGE SUGGESTIVE OF ISCHEMIA.; LOW RISK, EF 60%. No ischemia or infarction. No wall motion abnormality   . PACEMAKER PLACEMENT    . REVISION TOTAL KNEE ARTHROPLASTY Left   . TONSILLECTOMY    . TOTAL KNEE ARTHROPLASTY Bilateral   . TRANSTHORACIC ECHOCARDIOGRAM  11/2017   11/2017: EF 60-65%. Gr1 DD. PAP ~33 mmHg.  Mild TR. PPM leads in RA & RV.   Marland Kitchen TRANSTHORACIC ECHOCARDIOGRAM  2014; October 2016   a. EF 30-16%; normal diastolic pressures, no regional wall motion motion abnormalities;; b/ F 55-60%. Normal wall motion. GR 1 DD. No valve lesions  . TRIGGER FINGER  RELEASE  11/22/2011   Procedure: RELEASE TRIGGER FINGER/A-1 PULLEY;  Surgeon: Meredith Pel, MD;  Location: [place];  Service: Orthopedics;  Laterality: Left;  Left trigger thumb release  . TRIGGER FINGER RELEASE Right yrs ago     Current Outpatient Medications  Medication Sig Dispense Refill  . acetaminophen (TYLENOL) 650 MG CR tablet Take 650 mg by mouth every 8 (eight) hours as needed for pain.    Marland Kitchen albuterol (PROVENTIL HFA;VENTOLIN HFA) 108 (90 BASE) MCG/ACT inhaler Inhale 2 puffs into the lungs every 6 (six) hours as needed for wheezing.    . budesonide (PULMICORT) 0.25 MG/2ML nebulizer solution One vial twice daily (Patient taking differently: Take 0.25 mg  by nebulization 2 (two) times daily as needed (shortness of breath). ) 120 mL 12  . busPIRone (BUSPAR) 10 MG tablet Take 1 tablet (10 mg total) by mouth every morning. 60 tablet 1  . Cholecalciferol (VITAMIN D-3 PO) Take 2 tablets by mouth every morning.     . clonazePAM (KLONOPIN) 0.5 MG tablet Take 0.5-1 mg by mouth See admin instructions. Takes 2 tabs in am and 1 tab in pm.    . clopidogrel (PLAVIX) 75 MG tablet Take 75 mg by mouth every morning.    . cyanocobalamin 100 MCG tablet Take 100 mcg by mouth daily.    Marland Kitchen dicyclomine (BENTYL) 10 MG capsule Take 1 capsule (10 mg total) by mouth 3 (three) times daily before meals.    . donepezil (ARICEPT) 10 MG tablet TAKE 1 TABLET BY MOUTH EVERYDAY AT BEDTIME (Patient taking differently: Take 10 mg by mouth at bedtime. ) 90 tablet 1  . esomeprazole (NEXIUM) 40 MG capsule Take 30-60 min before first meal of the day 30 capsule 11  . famotidine (PEPCID) 20 MG tablet One after supper 30 tablet 11  . formoterol (PERFOROMIST) 20 MCG/2ML nebulizer solution Take 2 mLs (20 mcg total) by nebulization 2 (two) times daily. Use in nebulizer twice daily perfectly regularly 120 mL 11  . glipiZIDE (GLUCOTROL) 10 MG tablet Take 20 mg by mouth 2 (two) times daily before a meal.     . guaiFENesin (MUCINEX)  600 MG 12 hr tablet Take 600 mg by mouth 2 (two) times daily as needed for cough.    . insulin NPH (HUMULIN N,NOVOLIN N) 100 UNIT/ML injection Inject 0-20 Units into the skin See admin instructions. Novolin-N Give 20 unit in morning; give  5 units at 4pm; evening dose as needed.  If less than 150 blood sugar= 0units.  If greater than 200 blood sugar= give 10units to decrease by 100 blood sugar    . insulin regular (NOVOLIN R,HUMULIN R) 100 units/mL injection Inject 0-25 Units into the skin See admin instructions. Give 25 unit in morning; give 20units at 4pm; evening dose as needed.  If less than 150 blood sugar= 0units.  If greater than 200 blood sugar= give 10units to decrease by 100 blood sugar    . LEVOXYL 75 MCG tablet Take 75 mcg by mouth daily.    Marland Kitchen lidocaine (LIDODERM) 5 % Place 3 patches onto the skin daily as needed (pain). Remove & Discard patch within 12 hours or as directed by MD    . linaclotide (LINZESS) 290 MCG CAPS capsule Take 290 mcg by mouth daily before breakfast.    . LORazepam (ATIVAN) 0.5 MG tablet Take 0.5 mg by mouth daily as needed for anxiety.    . Menthol-Methyl Salicylate (MUSCLE RUB) 10-15 % CREA Apply 1 application topically as needed for muscle pain.    . midodrine (PROAMATINE) 2.5 MG tablet Take 1 tablet (2.5 mg total) by mouth 3 (three) times daily with meals. (Patient taking differently: Take 2.5 mg by mouth 3 (three) times daily with meals. As needed if blood pressure goes to low.) 90 tablet 6  . nortriptyline (PAMELOR) 50 MG capsule TAKE 2 CAPSULES (100MG ) AT BEDTIME (Patient taking differently: Take 50 mg by mouth 2 (two) times daily. ) 180 capsule 1  . ondansetron (ZOFRAN) 4 MG tablet Take 4 mg by mouth as needed for nausea or vomiting.   0  . OXYGEN Inhale 2 L into the lungs as needed. Used at overnight with a concentrator     .  polyethylene glycol (MIRALAX / GLYCOLAX) packet Take 17 g by mouth daily as needed for mild constipation.     . potassium chloride SA  (K-DUR,KLOR-CON) 20 MEQ tablet Take 1 tablet (20 mEq total) by mouth 2 (two) times daily. 10 tablet 0  . predniSONE (DELTASONE) 10 MG tablet TAKE 2 TABS EVERY MORNING UNTIL 100% BETTER THEN TAKE 1 TAB EVERY DAY FOR 1 WEEK THEN 1/2 TAB DAILY 100 tablet 1  . Probiotic Product (ALIGN) 4 MG CAPS Take 1 capsule by mouth daily.    . rosuvastatin (CRESTOR) 20 MG tablet Take 20 mg by mouth daily.     . solifenacin (VESICARE) 5 MG tablet Take 5 mg by mouth daily.    Marland Kitchen terconazole (TERAZOL 7) 0.4 % vaginal cream Place 1 applicator vaginally daily as needed for itching.    . furosemide (LASIX) 20 MG tablet Take 1 tablet (20 mg total) by mouth daily as needed for fluid or edema. 90 tablet 3   No current facility-administered medications for this visit.    Allergies:   Iodine, Iohexol, Metoclopramide hcl, Sulfa antibiotics, Lactose intolerance (gi), and Lansoprazole    Social History:  The patient  reports that she has never smoked. She has never used smokeless tobacco. She reports that she does not drink alcohol or use drugs.   Family History:  The patient's family history includes Cancer in her mother; Cancer - Prostate in her father; Diabetes in her brother, father, mother, paternal grandmother, and sister; Retinal detachment in her son.    ROS: All other systems are reviewed and negative. Unless otherwise mentioned in H&P    PHYSICAL EXAM: VS:  BP (!) 141/69   Pulse 61   Ht 5\' 7"  (1.702 m)   Wt 255 lb 12.8 oz (116 kg)   SpO2 96%   BMI 40.06 kg/m  , BMI Body mass index is 40.06 kg/m. GEN: Well nourished, well developed, in no acute distress HEENT: normal Neck: no JVD, carotid bruits, or masses Cardiac: RRR; no murmurs, rubs, or gallops,no edema  Respiratory:  Clear to auscultation bilaterally, normal work of breathing GI: soft, nontender, nondistended, + BS MS: no deformity or atrophy, pacemaker is noted left upper chest.  Pain is reproducible with palpation of her chest wall, arms and  shoulders. Skin: warm and dry, no rash Neuro:  Strength and sensation are intact Psych: euthymic mood, easily confused, poor memory   EKG: Not completed this office visit     Recent Labs: 03/19/2019: ALT 12 03/22/2019: B Natriuretic Peptide 28.1; BUN 14; Creatinine, Ser 1.53; Hemoglobin 12.3; Platelets 248; Potassium 4.1; Sodium 140    Lipid Panel    Component Value Date/Time   CHOL 125 04/20/2012 0514   TRIG 168 (H) 04/20/2012 0514   HDL 49 04/20/2012 0514   CHOLHDL 2.6 04/20/2012 0514   VLDL 34 04/20/2012 0514   LDLCALC 42 04/20/2012 0514      Wt Readings from Last 3 Encounters:  04/17/19 255 lb 12.8 oz (116 kg)  03/19/19 260 lb (117.9 kg)  01/15/19 262 lb (118.8 kg)      Other studies Reviewed: Echocardiogram November 19, 2017  Left ventricle: The cavity size was normal. There was mild  concentric hypertrophy. Systolic function was normal. The  estimated ejection fraction was in the range of 60% to 65%. Wall  motion was normal; there were no regional wall motion  abnormalities. Doppler parameters are consistent with abnormal  left ventricular relaxation (grade 1 diastolic dysfunction).  Doppler parameters are consistent  with elevated ventricular  end-diastolic filling pressure.  - Mitral valve: There was no regurgitation.  - Right ventricle: Pacer wire or catheter noted in right ventricle.  Systolic function was normal.  - Right atrium: Pacer wire or catheter noted in right atrium.  - Tricuspid valve: There was mild regurgitation.  - Pulmonary arteries: Systolic pressure was mildly increased. PA  peak pressure: 33 mm Hg (S).  - Inferior vena cava: The vessel was normal in size. The  respirophasic diameter changes were in the normal range (= 50%),  consistent with normal central venous pressure.  - Pericardium, extracardiac: There was no pericardial effusion.    ASSESSMENT AND PLAN:  1.  Atypical chest pain: Appears to be more musculoskeletal in  etiology and is reproducible with palpation.  No planned cardiac testing.  She was seen in the ED and had been ruled out for ACS as well.  Continue current medication regimen.  2.  Hypertension: Blood pressure slightly elevated today.  She has some multiple somatic complaints of pain and has low-level anxiety.  No changes in her medications at this time.  Reassurance is given that her chest discomfort is not cardiac in etiology.  3. Insulin-dependent diabetes: Followed by PCP.  She does have neuropathy with discomfort.  I believe this is also playing into her chronic pain.  She is to follow-up with neurologist for management.  4.  History of CVA.  Continue on clopidogrel.  5.  Hyperlipidemia: Continue on rosuvastatin 20 mg daily.  Goal LDL less than 70.  6.  Chronic anxiety with low level dementia.  May be related to CVA versus other etiology.  She is on multiple antianxiety medications at this time. to CVA vs other etiology. Defer to PCP or neurology.   7.  Pacemaker in situ: Continue pacemaker interrogation remotely per protocol and follow-up with Dr. Lovena Le on annual visit.   Current medicines are reviewed at length with the patient today.  I have spent 40 minutes  dedicated to the care of this patient on the date of this encounter to include pre-visit review of records, assessment, management and diagnostic testing,with shared decision making.  Labs/ tests ordered today include: None  Phill Myron. West Pugh, ANP, AACC   04/17/2019 5:16 PM    [facility] [place] [address] Office [phone] Fax [phone]  Notice: This dictation was prepared with Dragon dictation along with smaller phrase technology. Any transcriptional errors that result from this process are unintentional and may not be corrected upon review.

## 2019-05-07 ENCOUNTER — Other Ambulatory Visit: Payer: Self-pay | Admitting: Internal Medicine

## 2019-05-07 DIAGNOSIS — N644 Mastodynia: Secondary | ICD-10-CM

## 2019-05-11 IMAGING — CR DG ABDOMEN 2V
6 series · 6 of 6 positions shown · non-contrast
Comparison: Abdominal and pelvic CT scan August 05, 2013

CLINICAL DATA: Abdominal pain and bloating.

EXAM:
ABDOMEN - 2 VIEW

[w abdomen upright * (1 of 2)]
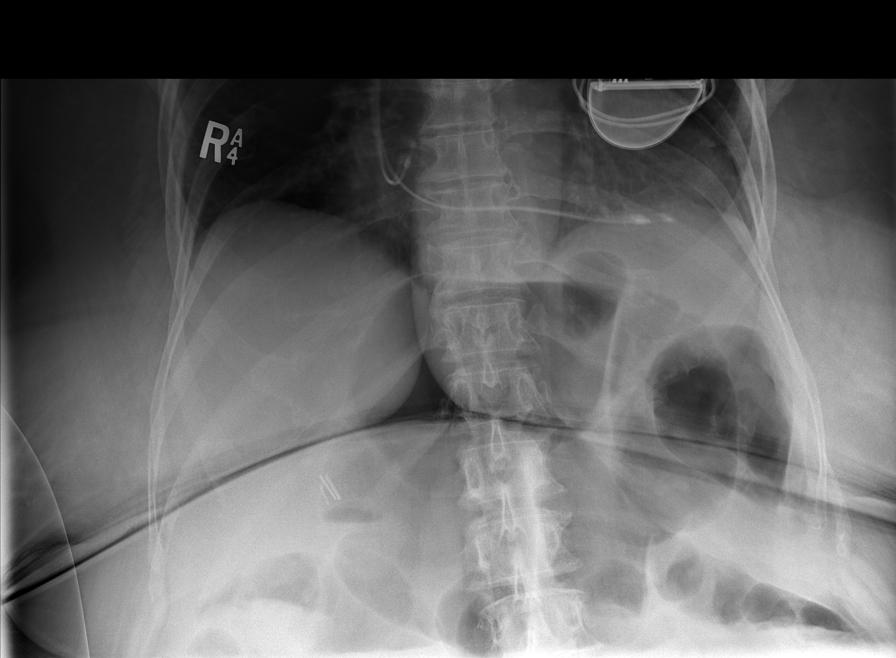

[w abdomen upright * (2 of 2)]
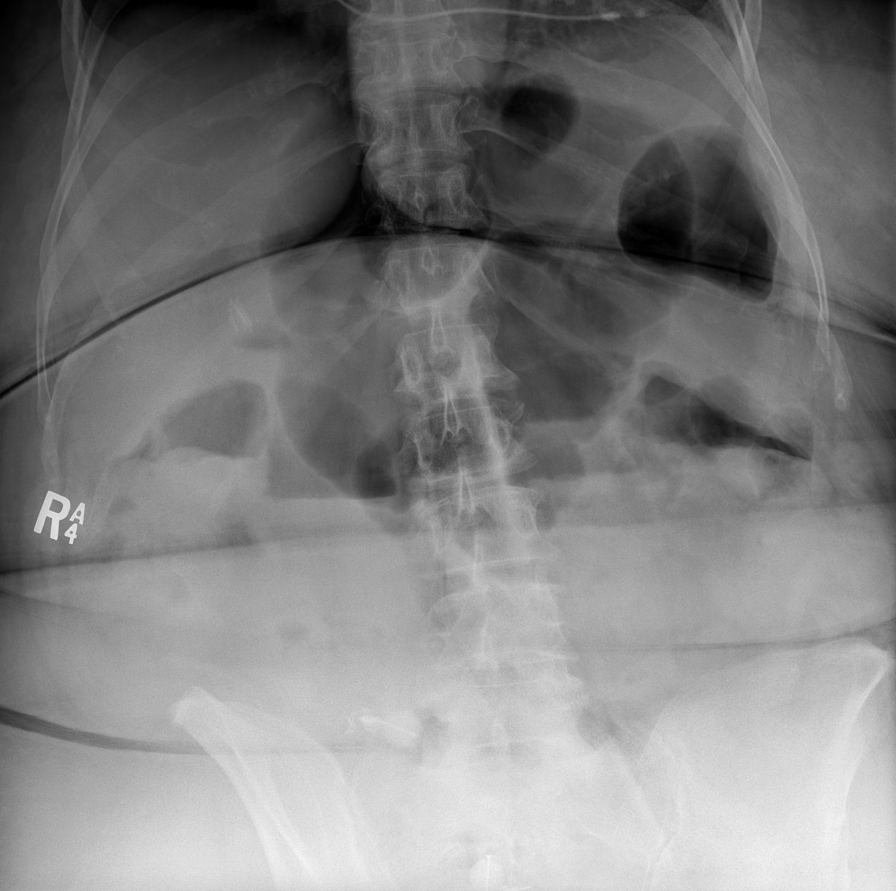

[t abdomen supine * (1 of 4)]
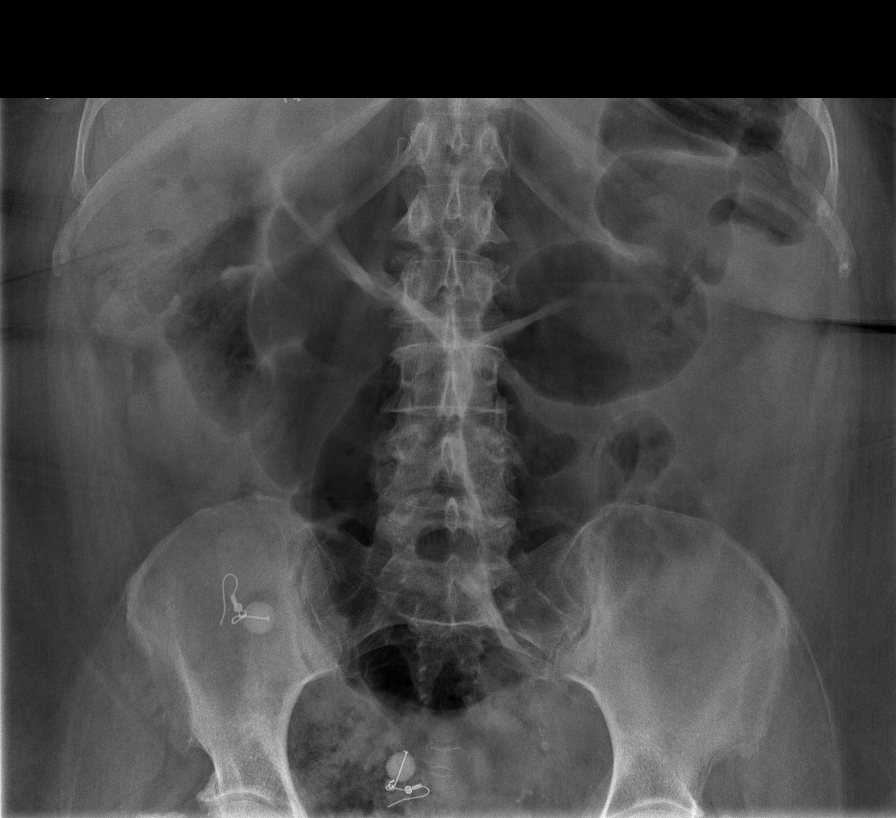

[t abdomen supine * (2 of 4)]
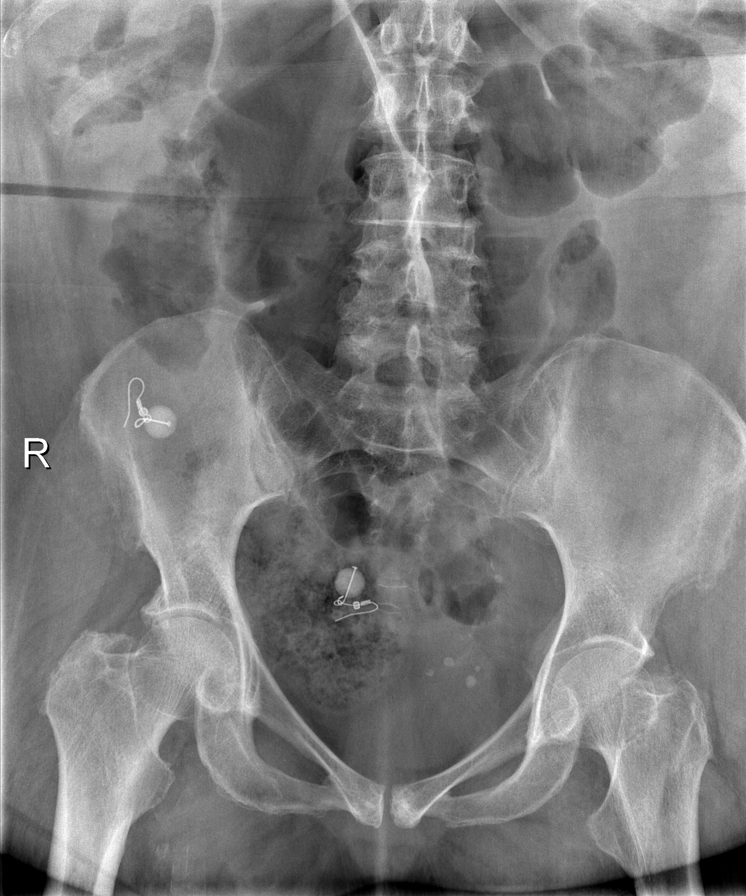

[t abdomen supine * (3 of 4)]
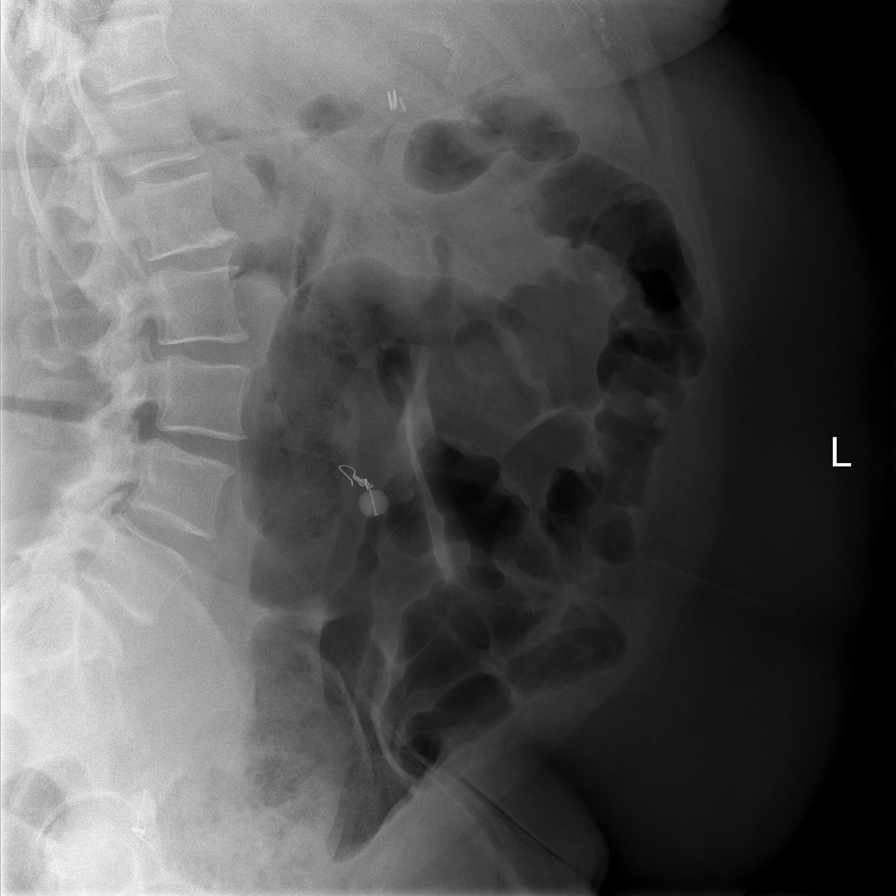

[t abdomen supine * (4 of 4)]
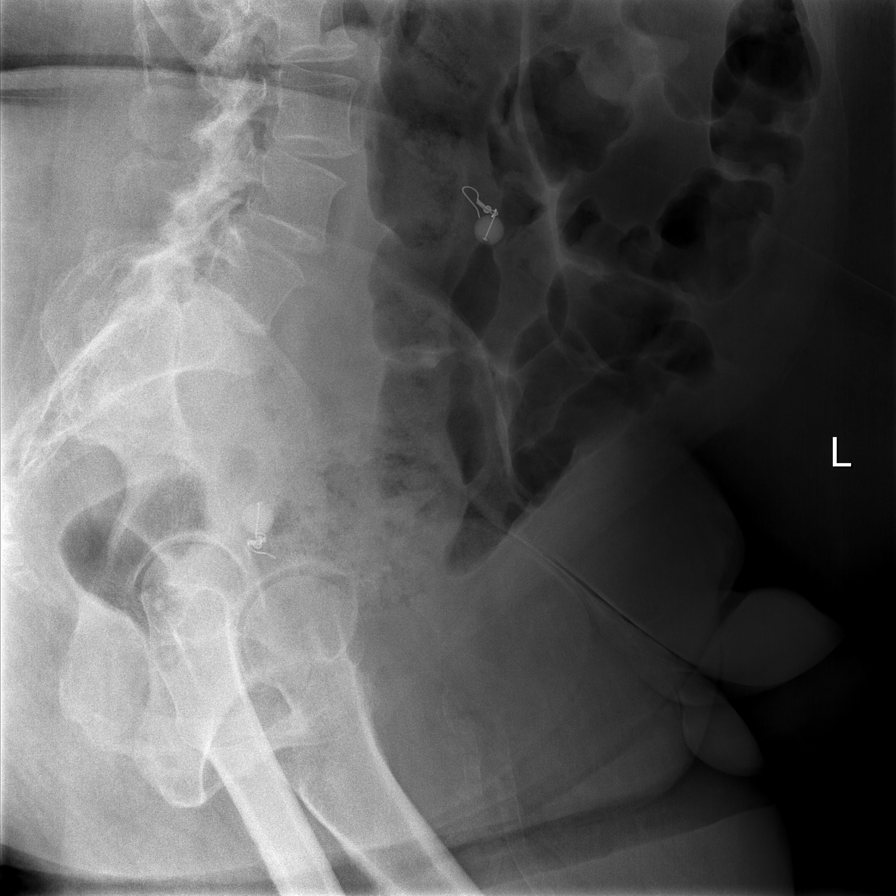

[6 of 6 positions shown; findings below may reference images not displayed]

FINDINGS: Metallic earrings project over the pelvis and were found to lie
within the pelvic cavity on on a supplemental lateral view. There is
a moderate amount of gas within the colon. The pattern is not
clearly obstructed but may reflect a mild ileus. A few air-fluid
levels are observed. There is no free extraluminal gas. The there is
gas and stool in the rectum. There surgical clips in the gallbladder
fossa. No abnormal soft tissue calcifications are observed. The bony
structures exhibit no acute abnormality.
IMPRESSION: The patient has likely ingested earrings which overlie the pelvis
likely in the right colon or sigmoid colon. No evidence of small
bowel obstruction. Moderate amount of colonic gas and some fluid
which may reflect an ileus or colitis type pattern. No evidence of
perforation.

This report was called to Dr. [REDACTED] at [DATE] p.m. on 17 November, 2016.

## 2019-05-21 ENCOUNTER — Ambulatory Visit
Admission: RE | Admit: 2019-05-21 | Discharge: 2019-05-21 | Disposition: A | Discharge status: Home / Self Care | Payer: Medicare HMO | Source: Ambulatory Visit | Attending: Internal Medicine | Admitting: Internal Medicine

## 2019-05-21 ENCOUNTER — Ambulatory Visit: Payer: Medicare HMO

## 2019-05-21 ENCOUNTER — Other Ambulatory Visit: Payer: Self-pay | Admitting: Internal Medicine

## 2019-05-21 ENCOUNTER — Other Ambulatory Visit: Payer: Self-pay

## 2019-05-21 DIAGNOSIS — R921 Mammographic calcification found on diagnostic imaging of breast: Secondary | ICD-10-CM

## 2019-05-21 DIAGNOSIS — N644 Mastodynia: Secondary | ICD-10-CM

## 2019-05-29 ENCOUNTER — Ambulatory Visit
Admission: RE | Admit: 2019-05-29 | Discharge: 2019-05-29 | Disposition: A | Discharge status: Home / Self Care | Payer: Medicare HMO | Source: Ambulatory Visit | Attending: Internal Medicine | Admitting: Internal Medicine

## 2019-05-29 ENCOUNTER — Other Ambulatory Visit: Payer: Self-pay

## 2019-05-29 DIAGNOSIS — R921 Mammographic calcification found on diagnostic imaging of breast: Secondary | ICD-10-CM

## 2019-06-13 ENCOUNTER — Encounter (HOSPITAL_COMMUNITY): Payer: Self-pay | Admitting: Emergency Medicine

## 2019-06-13 ENCOUNTER — Emergency Department (HOSPITAL_COMMUNITY): Payer: Medicare HMO

## 2019-06-13 ENCOUNTER — Emergency Department (HOSPITAL_COMMUNITY)
Admission: EM | Admit: 2019-06-13 | Discharge: 2019-06-13 | Disposition: A | Discharge status: Home / Self Care | Payer: Medicare HMO | Attending: Emergency Medicine | Admitting: Emergency Medicine

## 2019-06-13 ENCOUNTER — Emergency Department (HOSPITAL_COMMUNITY)
Admission: EM | Admit: 2019-06-13 | Discharge: 2019-06-13 | Disposition: A | Discharge status: Home / Self Care | Payer: Medicare HMO | Source: Home / Self Care

## 2019-06-13 ENCOUNTER — Other Ambulatory Visit: Payer: Self-pay

## 2019-06-13 DIAGNOSIS — R079 Chest pain, unspecified: Secondary | ICD-10-CM | POA: Insufficient documentation

## 2019-06-13 DIAGNOSIS — Z5321 Procedure and treatment not carried out due to patient leaving prior to being seen by health care provider: Secondary | ICD-10-CM | POA: Diagnosis not present

## 2019-06-13 LAB — BASIC METABOLIC PANEL
Anion gap: 13 (ref 5–15)
BUN: 21 mg/dL (ref 8–23)
CO2: 25 mmol/L (ref 22–32)
Calcium: 9.3 mg/dL (ref 8.9–10.3)
Chloride: 100 mmol/L (ref 98–111)
Creatinine, Ser: 1.63 mg/dL — ABNORMAL HIGH (ref 0.44–1.00)
GFR calc Af Amer: 34 mL/min — ABNORMAL LOW (ref >60)
GFR calc non Af Amer: 29 mL/min — ABNORMAL LOW (ref >60)
Glucose, Bld: 345 mg/dL — ABNORMAL HIGH (ref 70–99)
Potassium: 4.1 mmol/L (ref 3.5–5.1)
Sodium: 138 mmol/L (ref 135–145)

## 2019-06-13 LAB — CBC
HCT: 39.7 % (ref 36.0–46.0)
Hemoglobin: 12.4 g/dL (ref 12.0–15.0)
MCH: 32 pg (ref 26.0–34.0)
MCHC: 31.2 g/dL (ref 30.0–36.0)
MCV: 102.3 fL — ABNORMAL HIGH (ref 80.0–100.0)
Platelets: 228 10*3/uL (ref 150–400)
RBC: 3.88 MIL/uL (ref 3.87–5.11)
RDW: 12.2 % (ref 11.5–15.5)
WBC: 6.3 10*3/uL (ref 4.0–10.5)
nRBC: 0 % (ref 0.0–0.2)

## 2019-06-13 LAB — TROPONIN I (HIGH SENSITIVITY): Troponin I (High Sensitivity): 6 ng/L (ref <18)

## 2019-06-13 MED ORDER — SODIUM CHLORIDE 0.9% FLUSH: 3.0000 mL | Freq: Once | INTRAVENOUS | Status: DC

## 2019-06-13 NOTE — ED Triage Notes (Signed)
Pt states she started having CP this morning. Pain in center of her chest and radiates to her back. Pt has pacemaker

## 2019-06-13 NOTE — ED Notes (Signed)
Secretary contacted this RN to advised that a nurse from [facility] called stating patient is now there and requested that we discharge patient out of our system.

## 2019-06-13 NOTE — ED Notes (Signed)
Blood results from [facility] noted to result at 1321.

## 2019-06-13 NOTE — ED Triage Notes (Signed)
Per pt, states she has been having mid/central CP that started this am-states she has a pacemaker and has had it interrogated recently-states she took a Lasix which has not helped-went to [facility] and they her it would be 15 hours till she would be seen

## 2019-06-13 NOTE — ED Notes (Signed)
Pt states she is leaving and going to [facility]. Informed pt there still may be a wait. Pt husband states he going to just take her and see

## 2019-06-14 ENCOUNTER — Telehealth: Payer: Self-pay | Admitting: Cardiology

## 2019-06-14 NOTE — Telephone Encounter (Signed)
Returned call to patient.Spoke to husband he stated wife has been having chest pain off and on since yesterday.Chest pain radiates into back. Stated she went to [facility] ED and [facility] ED last night but left did not want to wait any longer.Stated she has had chest pain again this morning.No chest pain at present.Appointment scheduled with Roby Lofts PA 5/12 at 10:45 am.[place] encouraged if chest pain returns she needs to go to [facility] ED.

## 2019-06-14 NOTE — Telephone Encounter (Signed)
Pt c/o of Chest Pain: STAT if CP now or developed within 24 hours  1. Are you having CP right now? no  2. Are you experiencing any other symptoms (ex. SOB, nausea, vomiting, sweating)? No   3. How long have you been experiencing CP? Started yesterday around [place] to ED, but wasn't seen was only triaged.  The wait was too long, went to both [facility] and [facility]  4. Is your CP continuous or coming and going? Comes and goes.   5. Have you taken Nitroglycerin? No, doesn't take it. It makes her pass out.  ?

## 2019-06-18 NOTE — Progress Notes (Signed)
Cardiology Office Note   Date:  06/19/2019   ID:  Tara Jackson, Tara Jackson [birth year], MRN [id number]  PCP:  Shon Baton, MD  Cardiologist:  Glenetta Hew, MD EP: Cristopher Peru, MD  Chief Complaint  Patient presents with  . Chest Pain      History of Present Illness: Tara Jackson is a 79 y.o. female with PMH of SSS s/p PPM, chronic DOE, HTN c/b orthostatic hypotension, DM type 2, CVA, hypothyroidism, asthma, anxiety, depression, who presents for the evaluation of chest pain.  She was last evaluated by cardiology at an outpatient visit with Jory Sims, NP 04/17/19 at which time she had multiple somatic complaints including sticking chest pain,muscle aches/pains, palpitations, LE edema, back pain, soreness at her PPM site, and unsteadiness on her feet. Her check pain was reproducible with palpation and no further cardiac work-up was recommended.   Her last echocardiogram in 2019 showed EF 60-65%, G1DD, no RWMA, mild TR, and mildly elevated PA pressures. Her last ischemic evaluation was a NST in 2018 which was without ischemia.   She called the office 06/14/19 with complaints of intermittent chest pain, for which she sought care at [facility] and [facility] ED, though left prior to evaluation due to the wait. EKG was non-ischemic and trop was negative at that time.  She presents today for follow-up of her chest pain. She reports intermittent sharp lower chest pain/epigastric pain, occasionally under left breast and radiating to her back. Pain is worse when laying down at night. No exertional component. She reported some improvement with taking pepto bismol. She has also had a couple episodes of nausea with NBNB vomiting. She thinks her stool was a little dark yesterday but no hematochezia or melena. She had some abdominal fullness yesterday which improved with her prn lasix. She needs a refill of this medication today. She has chronic DOE/SOB and LE edema which are unchanged. She has occasional dizziness  with 1 fall in the past 2 weeks. She attributes falls to weakness. No syncopal episodes. Suggested reaching out to her PCP to coordinate home PT.    Past Medical History:  Diagnosis Date  . Anemia   . Anxiety   . Arthritis   . Asthma   . Brittle bone disease   . CKD (chronic kidney disease)   . Complication of anesthesia    hard to wake up after anesthesia, trouble turning head  . Coronary artery disease, non-occlusive 2014   a. minimal CAD by cath in 2014 with 40% RI stenosis. b. low-risk NST in 11/2014.  Marland Kitchen Depression   . Diabetes mellitus type 2, insulin dependent ([place])   . Diabetic neuropathy ([place])   . Diabetic retinopathy ([place])    NPDR OU  . Diastolic dysfunction, left ventricle 11/30/14   EF 55%, grade 1 DD  . DVT (deep venous thrombosis) ([place])    "18 in RLE; 7 LLE prior to PE" (03/24/2015)  . Family history of adverse reaction to anesthesia    " the whole family is hard to wake up"  . Glaucoma   . H/O hiatal hernia   . Hyperlipidemia   . Hypertension associated with diabetes ([place])   . Hypertensive retinopathy    OU  . Hypothyroidism   . Kidney stones    "passed them"  . Macular degeneration    Exu OU  . Memory loss 06/03/2014  . On home oxygen therapy    "2L oxygen concentrator @ night"  . Osteoporosis   .  Oxygen desaturation during sleep    wears 2 liters of oxygen at night   . Palpitations 35years ago   Cardionet monitor - revealed mostly normal sinus rhythm, sinus bradycardia with first-degree A-V block heart rates mostly in the 50s and 60s with some 70s. No arrhythmias, PVCs or PACs noted.  . PE (pulmonary thromboembolism) ([place]) x 3, last one was ~ 2003   history of recurrent RLE DVT with PE - last PE ~>13 yrs; Maintained on Plavix  . Pneumonia   . Presence of permanent cardiac pacemaker   . Restless legs syndrome   . Sleep apnea    cpap disontinued; test done 07/14/2008 ordered per  [place]rum  . Spinal headache   . Spinal stenosis    L 3, L 4 and L 5, and C  1, C 2 and C3  . Stroke [facility]) 04-19-2013   tia and stroke. Weakness Rt hand  . 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 s/p [employer] PPM in 03/2015 by Dr. Lovena Le secondary to sinus node dysfunction.   Marland Kitchen TIA (transient ischemic attack) March 2014    Past Surgical History:  Procedure Laterality Date  . ABDOMINAL HYSTERECTOMY    . APPENDECTOMY    . BACK SURGERY     steroid inj  . CATARACT EXTRACTION Bilateral   . CATARACT EXTRACTION W/ INTRAOCULAR LENS  IMPLANT, BILATERAL Bilateral 2000s  . CHOLECYSTECTOMY    . EP IMPLANTABLE DEVICE N/A 03/24/2015   [facility] Scientific PPM, Dr. Lovena Le  . ESOPHAGOGASTRODUODENOSCOPY  08/09/2011   Procedure: ESOPHAGOGASTRODUODENOSCOPY (EGD);  Surgeon: Jeryl Columbia, MD;  Location: [facility];  Service: Endoscopy;  Laterality: N/A;  . ESOPHAGOGASTRODUODENOSCOPY (EGD) WITH PROPOFOL N/A 11/12/2013   Procedure: ESOPHAGOGASTRODUODENOSCOPY (EGD) WITH PROPOFOL;  Surgeon: Jeryl Columbia, MD;  Location: [facility] ENDOSCOPY;  Service: Endoscopy;  Laterality: N/A;  . ESOPHAGOGASTRODUODENOSCOPY (EGD) WITH PROPOFOL N/A 05/05/2016   Procedure: ESOPHAGOGASTRODUODENOSCOPY (EGD) WITH PROPOFOL;  Surgeon: Clarene Essex, MD;  Location: [facility] ENDOSCOPY;  Service: Endoscopy;  Laterality: N/A;  . EYE SURGERY Bilateral    Cat Sx OU  . GLAUCOMA SURGERY Bilateral 2000s   "laser"  . HOT HEMOSTASIS  08/09/2011   Procedure: HOT HEMOSTASIS (ARGON PLASMA COAGULATION/BICAP);  Surgeon: Jeryl Columbia, MD;  Location: [facility];  Service: Endoscopy;  Laterality: N/A;  . HOT HEMOSTASIS N/A 11/12/2013   Procedure: HOT HEMOSTASIS (ARGON PLASMA COAGULATION/BICAP);  Surgeon: Jeryl Columbia, MD;  Location: [facility];  Service: Endoscopy;  Laterality: N/A;  . INSERT / REPLACE / REMOVE PACEMAKER  03/24/2015  . IRIDOTOMY / IRIDECTOMY Bilateral   . JOINT REPLACEMENT    . KNEE SURGERY Left done with 2nd left knee replacement   spacer bar placement  . LEFT HEART CATHETERIZATION WITH CORONARY ANGIOGRAM N/A  02/14/2012   Procedure: LEFT HEART CATHETERIZATION WITH CORONARY ANGIOGRAM;  Surgeon: Leonie Man, MD;  Location: [facility] CATH LAB;  Service: Cardiovascular:: no evidence of obstructive coronary disease ,low EDP with normal EF  . LEV DOPPLER  05/29/2012   RIGHT EXTREM. NORMAL VENOUS DUPLEX  . NM MYOCAR PERF WALL MOTION  01/26/2012; 11/2014   a. EF 79%,LV normal,ST SEGMENT CHANGE SUGGESTIVE OF ISCHEMIA.; LOW RISK, EF 60%. No ischemia or infarction. No wall motion abnormality   . PACEMAKER PLACEMENT    . REVISION TOTAL KNEE ARTHROPLASTY Left   . TONSILLECTOMY    . TOTAL KNEE ARTHROPLASTY Bilateral   . TRANSTHORACIC ECHOCARDIOGRAM  11/2017   11/2017: EF 60-65%. Gr1 DD. PAP ~33 mmHg.  Mild TR. PPM  leads in RA & RV.   Marland Kitchen TRANSTHORACIC ECHOCARDIOGRAM  2014; October 2016   a. EF 21-30%; normal diastolic pressures, no regional wall motion motion abnormalities;; b/ F 55-60%. Normal wall motion. GR 1 DD. No valve lesions  . TRIGGER FINGER RELEASE  11/22/2011   Procedure: RELEASE TRIGGER FINGER/A-1 PULLEY;  Surgeon: Meredith Pel, MD;  Location: [place] [facility];  Service: Orthopedics;  Laterality: Left;  Left trigger thumb release  . TRIGGER FINGER RELEASE Right yrs ago     Current Outpatient Medications  Medication Sig Dispense Refill  . acetaminophen (TYLENOL) 650 MG CR tablet Take 650 mg by mouth every 8 (eight) hours as needed for pain.    Marland Kitchen albuterol (PROVENTIL HFA;VENTOLIN HFA) 108 (90 BASE) MCG/ACT inhaler Inhale 2 puffs into the lungs every 6 (six) hours as needed for wheezing.    . budesonide (PULMICORT) 0.25 MG/2ML nebulizer solution One vial twice daily (Patient taking differently: Take 0.25 mg by nebulization 2 (two) times daily as needed (shortness of breath). ) 120 mL 12  . busPIRone (BUSPAR) 10 MG tablet Take 1 tablet (10 mg total) by mouth every morning. 60 tablet 1  . Cholecalciferol (VITAMIN D-3 PO) Take 2 tablets by mouth every morning.     . clonazePAM (KLONOPIN) 0.5 MG tablet Take 0.5-1  mg by mouth See admin instructions. Takes 2 tabs in am and 1 tab in pm.    . clopidogrel (PLAVIX) 75 MG tablet Take 75 mg by mouth every morning.    . cyanocobalamin 100 MCG tablet Take 100 mcg by mouth daily.    Marland Kitchen dicyclomine (BENTYL) 10 MG capsule Take 1 capsule (10 mg total) by mouth 3 (three) times daily before meals.    . dicyclomine (BENTYL) 20 MG tablet Take 20 mg by mouth 4 (four) times daily.    Marland Kitchen donepezil (ARICEPT) 10 MG tablet TAKE 1 TABLET BY MOUTH EVERYDAY AT BEDTIME (Patient taking differently: Take 10 mg by mouth at bedtime. ) 90 tablet 1  . famotidine (PEPCID) 20 MG tablet One after supper 30 tablet 11  . formoterol (PERFOROMIST) 20 MCG/2ML nebulizer solution Take 2 mLs (20 mcg total) by nebulization 2 (two) times daily. Use in nebulizer twice daily perfectly regularly 120 mL 11  . glipiZIDE (GLUCOTROL) 10 MG tablet Take 20 mg by mouth 2 (two) times daily before a meal.     . guaiFENesin (MUCINEX) 600 MG 12 hr tablet Take 600 mg by mouth 2 (two) times daily as needed for cough.    . insulin NPH (HUMULIN N,NOVOLIN N) 100 UNIT/ML injection Inject 0-20 Units into the skin See admin instructions. Novolin-N Give 20 unit in morning; give  5 units at 4pm; evening dose as needed.  If less than 150 blood sugar= 0units.  If greater than 200 blood sugar= give 10units to decrease by 100 blood sugar    . insulin regular (NOVOLIN R,HUMULIN R) 100 units/mL injection Inject 0-25 Units into the skin See admin instructions. Give 25 unit in morning; give 20units at 4pm; evening dose as needed.  If less than 150 blood sugar= 0units.  If greater than 200 blood sugar= give 10units to decrease by 100 blood sugar    . levothyroxine (SYNTHROID) 100 MCG tablet Take 100 mcg by mouth daily.    Marland Kitchen LEVOXYL 75 MCG tablet Take 75 mcg by mouth daily.    Marland Kitchen lidocaine (LIDODERM) 5 % Place 3 patches onto the skin daily as needed (pain). Remove & Discard patch within 12 hours  or as directed by MD    . linaclotide  (LINZESS) 290 MCG CAPS capsule Take 290 mcg by mouth daily before breakfast.    . LORazepam (ATIVAN) 0.5 MG tablet Take 0.5 mg by mouth daily as needed for anxiety.    . Menthol-Methyl Salicylate (MUSCLE RUB) 10-15 % CREA Apply 1 application topically as needed for muscle pain.    . midodrine (PROAMATINE) 2.5 MG tablet Take 1 tablet (2.5 mg total) by mouth 3 (three) times daily with meals. (Patient taking differently: Take 2.5 mg by mouth 3 (three) times daily with meals. As needed if blood pressure goes to low.) 90 tablet 6  . nortriptyline (PAMELOR) 50 MG capsule TAKE 2 CAPSULES (100MG ) AT BEDTIME (Patient taking differently: Take 50 mg by mouth 2 (two) times daily. ) 180 capsule 1  . ondansetron (ZOFRAN) 4 MG tablet Take 4 mg by mouth as needed for nausea or vomiting.   0  . OXYGEN Inhale 2 L into the lungs as needed. Used at overnight with a concentrator     . pantoprazole (PROTONIX) 40 MG tablet Take 1 tablet (40 mg total) by mouth daily. 90 tablet 3  . polyethylene glycol (MIRALAX / GLYCOLAX) packet Take 17 g by mouth daily as needed for mild constipation.     . potassium chloride SA (K-DUR,KLOR-CON) 20 MEQ tablet Take 1 tablet (20 mEq total) by mouth 2 (two) times daily. 10 tablet 0  . predniSONE (DELTASONE) 10 MG tablet TAKE 2 TABS EVERY MORNING UNTIL 100% BETTER THEN TAKE 1 TAB EVERY DAY FOR 1 WEEK THEN 1/2 TAB DAILY 100 tablet 1  . Probiotic Product (ALIGN) 4 MG CAPS Take 1 capsule by mouth daily.    . rosuvastatin (CRESTOR) 20 MG tablet Take 20 mg by mouth daily.     . solifenacin (VESICARE) 5 MG tablet Take 5 mg by mouth daily.    Marland Kitchen terconazole (TERAZOL 7) 0.4 % vaginal cream Place 1 applicator vaginally daily as needed for itching.    . furosemide (LASIX) 20 MG tablet Take 1 tablet (20 mg total) by mouth daily as needed for fluid or edema. 90 tablet 1   No current facility-administered medications for this visit.    Allergies:   Iodine, Iohexol, Metoclopramide hcl, Sulfa antibiotics,  Lactose intolerance (gi), and Lansoprazole    Social History:  The patient  reports that she has never smoked. She has never used smokeless tobacco. She reports that she does not drink alcohol or use drugs.   Family History:  The patient's family history includes Cancer in her mother; Cancer - Prostate in her father; Diabetes in her brother, father, mother, paternal grandmother, and sister; Retinal detachment in her son.    ROS:  Please see the history of present illness.   Otherwise, review of systems are positive for none.   All other systems are reviewed and negative.    PHYSICAL EXAM: VS:  BP 134/70   Pulse 61   Temp (!) 96.4 F (35.8 C)   Ht 5' 7.5" (1.715 m)   Wt 246 lb (111.6 kg)   SpO2 98%   BMI 37.96 kg/m  , BMI Body mass index is 37.96 kg/m. GEN: Well nourished, well developed, in no acute distress HEENT: sclera anicteric Neck: no JVD, carotid bruits, or masses Cardiac: RRR; no murmurs, rubs, or gallops, trace LE edema; TTP of chest wall Respiratory:  clear to auscultation bilaterally, normal work of breathing GI: soft, obese, mild epigastric TTP, nondistended, + BS MS:  no deformity or atrophy Skin: warm and dry, no rash Neuro:  Strength and sensation are intact Psych: euthymic mood, full affect   EKG:  EKG is ordered today. The ekg ordered today demonstrates A-paced rhythm with 1st degree AV block, chronic non-specific ST-T wave abnormalities; no significant change from previous   Recent Labs: 03/19/2019: ALT 12 03/22/2019: B Natriuretic Peptide 28.1 06/13/2019: BUN 21; Creatinine, Ser 1.63; Hemoglobin 12.4; Platelets 228; Potassium 4.1; Sodium 138    Lipid Panel    Component Value Date/Time   CHOL 125 04/20/2012 0514   TRIG 168 (H) 04/20/2012 0514   HDL 49 04/20/2012 0514   CHOLHDL 2.6 04/20/2012 0514   VLDL 34 04/20/2012 0514   LDLCALC 42 04/20/2012 0514      Wt Readings from Last 3 Encounters:  06/19/19 246 lb (111.6 kg)  06/13/19 250 lb (113.4 kg)    04/17/19 255 lb 12.8 oz (116 kg)      Other studies Reviewed: Additional studies/ records that were reviewed today include:   Echocardiogram 10/26/2017  Left ventricle: The cavity size was normal. There was mild  concentric hypertrophy. Systolic function was normal. The  estimated ejection fraction was in the range of 60% to 65%. Wall  motion was normal; there were no regional wall motion  abnormalities. Doppler parameters are consistent with abnormal  left ventricular relaxation (grade 1 diastolic dysfunction).  Doppler parameters are consistent with elevated ventricular  end-diastolic filling pressure.  - Mitral valve: There was no regurgitation.  - Right ventricle: Pacer wire or catheter noted in right ventricle.  Systolic function was normal.  - Right atrium: Pacer wire or catheter noted in right atrium.  - Tricuspid valve: There was mild regurgitation.  - Pulmonary arteries: Systolic pressure was mildly increased. PA  peak pressure: 33 mm Hg (S).  - Inferior vena cava: The vessel was normal in size. The  respirophasic diameter changes were in the normal range (= 50%),  consistent with normal central venous pressure.  - Pericardium, extracardiac: There was no pericardial effusion.   NST 2018:  There was no ST segment deviation noted during stress.  No T wave inversion was noted during stress.  There is a very small mild intensity defect present in the apical inferior and apical lateral location that is fixed and likely represents diaphragmatic and large breast attenuation. No ischemia noted.  This is a low risk study.  The left ventricular ejection fraction is normal (55-65%).  Nuclear stress EF: 61%.  The study is normal.    ASSESSMENT AND PLAN:  1. Atypical chest pain: non-exertional chest pain with associated epigastric discomfort and nausea/vomiting. She reported improvement with pepto. Suspect GI etiology. She reported a darker stool  yesterday but no clear melena, hematochezia, or hematemesis. She thinks she's been taking pantoprazole QOD.  - Recommend pantoprazole 40mg  daily - Encouraged close monitoring of stools to evaluate for recurrent dark stools - Possible CHF component given recent abdominal bloating. Suggested lasix 20mg  daily x3 days, then resume prn dosing - Will follow-up in 1 month and if chest pain persists, could consider a repeat NST.   2. HTN: BP 134/70 today. She is on prn lasix - Continue prn lasix  3. HLD: LDL 50 04/2019 - Continue crestor  4. SSS s/p PPM: Followed by Dr. Lovena Le - Continue routine monitoring per Dr. Lovena Le  5. DM type 2: A1C 8.5 04/2019 - Continue management per PCP  6. CVA:  - Continue plavix   Current medicines are reviewed at  length with the patient today.  The patient does not have concerns regarding medicines.  The following changes have been made:  As above  Labs/ tests ordered today include:   Orders Placed This Encounter  Procedures  . EKG 12-Lead     Disposition:   FU with myself in 1 month for a virtual visit.  Signed, Abigail Butts, PA-C  06/19/2019 2:01 PM

## 2019-06-19 ENCOUNTER — Ambulatory Visit: Payer: Medicare HMO | Admitting: Medical

## 2019-06-19 ENCOUNTER — Encounter: Payer: Self-pay | Admitting: Medical

## 2019-06-19 ENCOUNTER — Other Ambulatory Visit: Payer: Self-pay

## 2019-06-19 VITALS — BP 134/70 | HR 61 | Temp 96.4°F | Ht 67.5 in | Wt 246.0 lb

## 2019-06-19 DIAGNOSIS — R0789 Other chest pain: Secondary | ICD-10-CM

## 2019-06-19 DIAGNOSIS — Z950 Presence of cardiac pacemaker: Secondary | ICD-10-CM

## 2019-06-19 DIAGNOSIS — I10 Essential (primary) hypertension: Secondary | ICD-10-CM | POA: Diagnosis not present

## 2019-06-19 DIAGNOSIS — E1121 Type 2 diabetes mellitus with diabetic nephropathy: Secondary | ICD-10-CM | POA: Diagnosis not present

## 2019-06-19 DIAGNOSIS — E785 Hyperlipidemia, unspecified: Secondary | ICD-10-CM

## 2019-06-19 DIAGNOSIS — I63139 Cerebral infarction due to embolism of unspecified carotid artery: Secondary | ICD-10-CM

## 2019-06-19 MED ORDER — FUROSEMIDE 20 MG PO TABS: 20.0000 mg | ORAL_TABLET | Freq: Every day | ORAL | 3 refills | Status: DC | PRN

## 2019-06-19 MED ORDER — PANTOPRAZOLE SODIUM 40 MG PO TBEC: 40.0000 mg | DELAYED_RELEASE_TABLET | Freq: Every day | ORAL | 3 refills | Status: DC

## 2019-06-19 MED ORDER — FUROSEMIDE 20 MG PO TABS: 20.0000 mg | ORAL_TABLET | Freq: Every day | ORAL | 1 refills | Status: DC | PRN

## 2019-06-19 NOTE — Patient Instructions (Addendum)
Medication Instructions:   Take Lasix 20 mg daily for 3 days, then resume as needed for swelling, weight gain of 3 lbs overnight 5 lbs in a week or shortness of breath   Take pantoprazole 40 mg daily  Continue to use Pepto for nausea and/or vomiting   *If you need a refill on your cardiac medications before your next appointment, please call your pharmacy*  Lab Work: NONE ordered at this time of appointment   If you have labs (blood work) drawn today and your tests are completely normal, you will receive your results only by: Marland Kitchen MyChart Message (if you have MyChart) OR . A paper copy in the mail If you have any lab test that is abnormal or we need to change your treatment, we will call you to review the results.  Testing/Procedures: NONE ordered at this time of appointment   Follow-Up: At [facility], you and your health needs are our priority.  As part of our continuing mission to provide you with exceptional heart care, we have created designated Provider Care Teams.  These Care Teams include your primary Cardiologist (physician) and Advanced Practice Providers (APPs -  Physician Assistants and Nurse Practitioners) who all work together to provide you with the care you need, when you need it.  Your next appointment:   1 month(s)  The format for your next appointment:   Virtual Visit   Provider:   Roby Lofts, PA-C  Other Instructions

## 2019-06-20 ENCOUNTER — Ambulatory Visit (INDEPENDENT_AMBULATORY_CARE_PROVIDER_SITE_OTHER): Payer: Medicare HMO | Admitting: *Deleted

## 2019-06-20 DIAGNOSIS — I495 Sick sinus syndrome: Secondary | ICD-10-CM | POA: Diagnosis not present

## 2019-06-20 LAB — CUP PACEART REMOTE DEVICE CHECK
Battery Remaining Longevity: 120 mo
Battery Remaining Percentage: 100 %
Brady Statistic RA Percent Paced: 82 %
Brady Statistic RV Percent Paced: 62 %
Date Time Interrogation Session: 20210513031400
Implantable Lead Implant Date: 20170214
Implantable Lead Implant Date: 20170214
Implantable Lead Location: 753859
Implantable Lead Location: 753860
Implantable Lead Model: 7740
Implantable Lead Model: 7741
Implantable Lead Serial Number: 649859
Implantable Lead Serial Number: 728741
Implantable Pulse Generator Implant Date: 20170214
Lead Channel Impedance Value: 681 Ohm
Lead Channel Impedance Value: 697 Ohm
Lead Channel Pacing Threshold Amplitude: 0.6 V
Lead Channel Pacing Threshold Amplitude: 1.3 V
Lead Channel Pacing Threshold Pulse Width: 0.4 ms
Lead Channel Pacing Threshold Pulse Width: 0.4 ms
Lead Channel Setting Pacing Amplitude: 1.7 V
Lead Channel Setting Pacing Amplitude: 2 V
Lead Channel Setting Pacing Pulse Width: 0.4 ms
Lead Channel Setting Sensing Sensitivity: 2.5 mV
Pulse Gen Serial Number: 718098

## 2019-06-24 NOTE — Progress Notes (Signed)
Remote pacemaker transmission.   

## 2019-07-16 ENCOUNTER — Ambulatory Visit (INDEPENDENT_AMBULATORY_CARE_PROVIDER_SITE_OTHER): Payer: Medicare HMO | Admitting: Internal Medicine

## 2019-07-16 ENCOUNTER — Other Ambulatory Visit: Payer: Self-pay

## 2019-07-16 ENCOUNTER — Encounter: Payer: Self-pay | Admitting: Internal Medicine

## 2019-07-16 DIAGNOSIS — J45991 Cough variant asthma: Secondary | ICD-10-CM | POA: Diagnosis not present

## 2019-07-16 NOTE — Progress Notes (Signed)
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 Consult    DME-[employer] on O2@2L  at night. has been having alot of "colds"coug, sinus drainage since 06-11-2018 on average using rescue inh. 2 time a day 4 days a week.  Dyspnea:  Better p albuterol / better p prednisone  Cough: since may 4th worse than usual  assoc with pnds /clear  Mucus/ not noct but severe to  point of gag/vomit Sleep: on side bed is flat/ 2 pillows variably cough/sob  SABA use: some better albuterol  Already tried 3 abx/ tessalon no better per office notes  Note flare off gerd rx which prev took per Dr Ewing Schlein  rec rec Plan A = Automatic = dulera 100 Take 2 puffs first thing in am and then another 2 puffs about 12 hours later - do not refill if not helping Work on inhaler technique:  Plan B = Backup Only use your albuterol inhaler as a rescue medication  Nexium  40 mg   Take  30-60 min before first meal of the day and Pepcid (famotidine)  20 mg one after supper  until return to office - this is the best way to tell whether stomach acid is contributing to your problem.   GERD diet      09/28/18  History of Present Illness: Dyspnea:  Much better while on dulera / less need albuterol / worse since ran out of sample and can't afford dulera on her plan Cough: more hoarse with sense of PNDS  but no real cough  Sleeping: bothered by nose running  SABA use: twice daily  02: 2lpm hs  rec Plan A = Automatic =start symbicort 80 Take 2 puffs first thing in am and then another 2  puffs about 12 hours later.  And take Prednisone 10 mg take  4 each am x 2 days,   2 each am x 2 days,  1 each am x 2 days and stop   Work on inhaler technique:  Plan B = Backup Only use your albuterol inhaler as a rescue medication     11/02/2018  Acute post ER f/u ov  ov/Dane Kopke re: cough variant asthma+/-  uacs Chief Complaint  Patient presents with  . Sinus drainage, out of breath    Does not feel any better since the hospital visit on 10/30/18 for sinus infection.  Dyspnea:  Room to room holding onto wall - poor balance and strength Cough: assoc with rhinorrhea, watery, some pnds no resp to atrovent ns  Sleeping: bed is flat disturbed by nasal symptoms > cough wheeze or sob    SABA use: none at all  02: 2lpm hs sometimes  rec For drainage / throat tickle try take CHLORPHENIRAMINE  4 mg  (Chlortab 4mg   at [employer] should be easiest to find in the green box)  take one every 4 hours as needed - available over the counter- may cause drowsiness so  start with just a bedtime dose or two and see how you tolerate it before trying in daytime   Prednisone 10 mg take  4 each am x 2 days,   2 each am x 2 days,  1 each am x 2 days and stop  Plan A = Automatic = Always=    Symbicort 80 Take 2 puffs first thing in am and then another 2 puffs about 12 hours later.  Work on inhaler technique:   Plan B = Backup (to supplement plan A, not to replace it)   Televisit 12/04/18 recs Prednisone 10 mg  2 each am until 100% better then one daily  X one week and then one half daily until seen  For drainage / throat tickle try take CHLORPHENIRAMINE  4 mg  (Chlortab 4mg   at [employer] should be easiest to find in the green box)  take one every 4 hours as needed -     12/18/2018  f/u ov/Dereon Williamsen re: cough variant asthma  Vs uacs  improves on pred only and can't use hfa (exhales ever time)  Chief Complaint  Patient presents with  . Follow-up    Breathing has improved some.   Dyspnea:  Room to room   Baseline holds on to wall / balance and strength issues  Cough: not much still hoarse  Sleeping: bed is flat / two pillows baseline SABA uses too much and not effective technique  02: 2lpm sometimes  rec When breathing is bad start back at Prednisone 10 mg  2 each am until 100% better then one daily  X one week and then one half daily threfore  For drainage / throat tickle try take CHLORPHENIRAMINE  4 mg  New maintenance therapy = Automatic = Always = nebulizer with budesonide and performist first thing in am and 12 hours later.   01/15/2019  f/u ov/Kerisha Goughnour re:  Cough variant asthma on prednisone 10 mg - 5 - 10  Chief Complaint  Patient presents with  . Follow-up    Breathing is much improved and she is no longer wheezing.   Dyspnea:  Limited by balance not sob  Cough: minimal/ some clear nasal drainage  Sleeping: flat bed / 2 pillows  SABA use: minimal  02: 2lpm prn minimal use  rec Let the nebulizer medicine medication go out thru your nose and use baking soda based toothpaste after use to reduce your mouth irritation  Ceiling on prednisone(10 mg)  is 20 mg and floor is 5 mg every day    Virtual Visit via Telephone Note 07/16/2019 s/p both covid shots  I connected with Bayley Dexheimer on 07/16/19 at  2:00 PM EDT by telephone and verified that I am speaking with the correct person using two identifiers.   I discussed the limitations, risks, security and privacy concerns of performing an evaluation and management service by telephone and the availability of in person appointments. I also discussed with the patient that there may be a patient responsible charge related to this service. The patient expressed understanding and agreed to proceed.   History of Present Illness: Dyspnea:  Was doing better on prednisone but ran out 5 d prior to the office  Cough: mucus is clear but having chills and back pain and ? Dysuria    No obvious day to day or daytime variability or assoc excess/  purulent sputum or mucus plugs or hemoptysis or cp or chest tightness, subjective wheeze or overt sinus or hb symptoms.  Also denies any obvious fluctuation of symptoms with weather or environmental changes or other aggravating or alleviating factors except as outlined above.   Meds reviewed/ med reconciliation completed         Observations/Objective: Talking in phrases very hoarse harsh cough     Assessment and Plan: See problem list for active a/p's    Follow Up Instructions: See avs for instructions unique to this ov which includes revised/ updated med list     I discussed the assessment and treatment plan with the patient. The patient was provided an opportunity to ask questions and all were answered. The patient agreed with the plan and demonstrated an understanding of the instructions.   The patient was advised to call back or seek an in-person evaluation if the symptoms worsen or if the condition fails to improve as anticipated.  I provided 15  minutes of non-face-to-face time during this encounter.   Sandrea Hughs, MD      Past Medical History:  Diagnosis Date  . Anemia   . Anxiety   . Arthritis   . Asthma   . Brittle bone disease   . Cataract   . CKD (chronic kidney disease)   . Complication of anesthesia    hard to wake up after anesthesia, trouble turning head  . Coronary artery disease, non-occlusive 2014   a. minimal CAD by cath in 2014 with 40% RI stenosis. b. low-risk NST in 11/2014.  Marland Kitchen Depression   . Diabetes mellitus type 2, insulin dependent (HCC)   . Diabetic neuropathy (HCC)   . Diastolic dysfunction, left ventricle 11/30/14   EF 55%, grade 1 DD  . DVT (deep venous thrombosis) (HCC)    "18 in RLE; 7 LLE prior to PE" (03/24/2015)  . Family history of adverse reaction to anesthesia    " the whole family is hard to wake up"  . Glaucoma   . H/O hiatal hernia   . Hyperlipidemia   . Hypertension associated with diabetes (HCC)   .  Hypothyroidism   . Kidney stones    "passed them"  . Memory loss 06/03/2014  . On home oxygen therapy    "2L oxygen concentrator @ night"  . Osteoporosis   . Oxygen desaturation during sleep    wears 2 liters of oxygen at night   . Palpitations 35years ago   Cardionet monitor - revealed mostly normal sinus rhythm, sinus bradycardia with first-degree A-V block heart rates mostly in the 50s and 60s with some 70s. No arrhythmias, PVCs or PACs noted.  . PE (pulmonary thromboembolism) (HCC) x 3, last one was ~ 2003   history of recurrent RLE DVT with PE - last PE ~>13 yrs; Maintained on Plavix  . Pneumonia   . Presence of permanent cardiac pacemaker   . Restless legs syndrome   . Sleep apnea    cpap disontinued; test done 07/14/2008 ordered per  Byrum  . Spinal headache   . Spinal stenosis    L 3, L 4 and L 5, and C 1, C 2 and C3  . Stroke [facility]) 04-19-2013   tia and stroke. Weakness Rt hand  . 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 s/p [employer] PPM in 03/2015 by Dr. Ladona Ridgel secondary to sinus node dysfunction.   Marland Kitchen TIA (transient ischemic attack) March 2014

## 2019-07-16 NOTE — Assessment & Plan Note (Signed)
Onset spring 2019  - CT sinus 12/21/17 wnl (as seen on head ct) - Allergy profile 07/25/2018 >  Eos 0.1 /  IgE 15 RAST neg   - 07/25/2018   try dulera 100 2bid x 2 weeks > helped but could not afford it  - 11/02/2018  After extensive coaching inhaler device,  effectiveness =    75% but no consistently at this level > symbicort 80 2bid with samples  - 12/04/2018 Prednisone 10 mg  2 each am until 100% better then one daily  X one week and then one half daily until seen  - 12/18/2018  After extensive coaching inhaler device,  effectiveness =    0% (exhales repeatedly) > rec perfomist/ bud neb > 01/15/2019 improved   Acute flare off prednisone  The goal with a chronic steroid dependent illness is always arriving at the lowest effective dose that controls the disease/symptoms and not accepting a set "formula" which is based on statistics or guidelines that don't always take into account patient  variability or the natural hx of the dz in every individual patient, which may well vary over time.  For now therefore I recommend the patient maintain  20 mg until better then wean to 5 mg daily   Acutely worse since stopped prednisone abruptly and now fever to 102 and "kidney pain" so rec go to ER asap.   Each maintenance medication was reviewed in detail including most importantly the difference between maintenance and as needed and under what circumstances the prns are to be used.  Please see AVS for specific  Instructions which are unique to this visit and I personally typed out  which were reviewed in detail over the phone with the patient and a copy provided via mychart

## 2019-07-16 NOTE — Patient Instructions (Addendum)
Prednisone 10 mg 2 daily until better then 1 daily x 5 days then one half daily   Go to ER asap   Keep appt to see Eustaquio Maize our NP on June 23 but you must bring all meds and inhalers/ neb solutions with you

## 2019-07-19 ENCOUNTER — Emergency Department (HOSPITAL_COMMUNITY): Payer: Medicare HMO

## 2019-07-19 ENCOUNTER — Emergency Department (HOSPITAL_COMMUNITY)
Admission: EM | Admit: 2019-07-19 | Discharge: 2019-07-19 | Disposition: A | Discharge status: Home / Self Care | Payer: Medicare HMO | Attending: Emergency Medicine | Admitting: Emergency Medicine

## 2019-07-19 ENCOUNTER — Other Ambulatory Visit: Payer: Self-pay

## 2019-07-19 ENCOUNTER — Encounter (HOSPITAL_COMMUNITY): Payer: Self-pay

## 2019-07-19 DIAGNOSIS — Z794 Long term (current) use of insulin: Secondary | ICD-10-CM | POA: Diagnosis not present

## 2019-07-19 DIAGNOSIS — Z79899 Other long term (current) drug therapy: Secondary | ICD-10-CM | POA: Insufficient documentation

## 2019-07-19 DIAGNOSIS — Z9861 Coronary angioplasty status: Secondary | ICD-10-CM | POA: Diagnosis not present

## 2019-07-19 DIAGNOSIS — Z950 Presence of cardiac pacemaker: Secondary | ICD-10-CM | POA: Diagnosis not present

## 2019-07-19 DIAGNOSIS — R509 Fever, unspecified: Secondary | ICD-10-CM | POA: Insufficient documentation

## 2019-07-19 DIAGNOSIS — I5032 Chronic diastolic (congestive) heart failure: Secondary | ICD-10-CM | POA: Insufficient documentation

## 2019-07-19 DIAGNOSIS — I2510 Atherosclerotic heart disease of native coronary artery without angina pectoris: Secondary | ICD-10-CM | POA: Insufficient documentation

## 2019-07-19 DIAGNOSIS — J45909 Unspecified asthma, uncomplicated: Secondary | ICD-10-CM | POA: Insufficient documentation

## 2019-07-19 DIAGNOSIS — R112 Nausea with vomiting, unspecified: Secondary | ICD-10-CM

## 2019-07-19 DIAGNOSIS — E039 Hypothyroidism, unspecified: Secondary | ICD-10-CM | POA: Diagnosis not present

## 2019-07-19 DIAGNOSIS — E1122 Type 2 diabetes mellitus with diabetic chronic kidney disease: Secondary | ICD-10-CM | POA: Insufficient documentation

## 2019-07-19 DIAGNOSIS — N1831 Chronic kidney disease, stage 3a: Secondary | ICD-10-CM | POA: Diagnosis not present

## 2019-07-19 DIAGNOSIS — I130 Hypertensive heart and chronic kidney disease with heart failure and stage 1 through stage 4 chronic kidney disease, or unspecified chronic kidney disease: Secondary | ICD-10-CM | POA: Diagnosis not present

## 2019-07-19 DIAGNOSIS — R1110 Vomiting, unspecified: Secondary | ICD-10-CM | POA: Diagnosis present

## 2019-07-19 LAB — CBC
HCT: 38.3 % (ref 36.0–46.0)
Hemoglobin: 12.3 g/dL (ref 12.0–15.0)
MCH: 32.2 pg (ref 26.0–34.0)
MCHC: 32.1 g/dL (ref 30.0–36.0)
MCV: 100.3 fL — ABNORMAL HIGH (ref 80.0–100.0)
Platelets: 237 10*3/uL (ref 150–400)
RBC: 3.82 MIL/uL — ABNORMAL LOW (ref 3.87–5.11)
RDW: 11.9 % (ref 11.5–15.5)
WBC: 6.6 10*3/uL (ref 4.0–10.5)
nRBC: 0 % (ref 0.0–0.2)

## 2019-07-19 LAB — COMPREHENSIVE METABOLIC PANEL
ALT: 12 U/L (ref 0–44)
AST: 19 U/L (ref 15–41)
Albumin: 3.9 g/dL (ref 3.5–5.0)
Alkaline Phosphatase: 62 U/L (ref 38–126)
Anion gap: 13 (ref 5–15)
BUN: 26 mg/dL — ABNORMAL HIGH (ref 8–23)
CO2: 28 mmol/L (ref 22–32)
Calcium: 9.2 mg/dL (ref 8.9–10.3)
Chloride: 97 mmol/L — ABNORMAL LOW (ref 98–111)
Creatinine, Ser: 1.73 mg/dL — ABNORMAL HIGH (ref 0.44–1.00)
GFR calc Af Amer: 32 mL/min — ABNORMAL LOW (ref >60)
GFR calc non Af Amer: 27 mL/min — ABNORMAL LOW (ref >60)
Glucose, Bld: 257 mg/dL — ABNORMAL HIGH (ref 70–99)
Potassium: 3.9 mmol/L (ref 3.5–5.1)
Sodium: 138 mmol/L (ref 135–145)
Total Bilirubin: 0.7 mg/dL (ref 0.3–1.2)
Total Protein: 7.4 g/dL (ref 6.5–8.1)

## 2019-07-19 LAB — TROPONIN I (HIGH SENSITIVITY)
Troponin I (High Sensitivity): 5 ng/L (ref <18)
Troponin I (High Sensitivity): 5 ng/L (ref <18)

## 2019-07-19 LAB — URINALYSIS, ROUTINE W REFLEX MICROSCOPIC
Bilirubin Urine: NEGATIVE
Glucose, UA: NEGATIVE mg/dL
Hgb urine dipstick: NEGATIVE
Ketones, ur: NEGATIVE mg/dL
Leukocytes,Ua: NEGATIVE
Nitrite: NEGATIVE
Protein, ur: NEGATIVE mg/dL
Specific Gravity, Urine: 1.009 (ref 1.005–1.030)
pH: 5 (ref 5.0–8.0)

## 2019-07-19 LAB — CBG MONITORING, ED: Glucose-Capillary: 235 mg/dL — ABNORMAL HIGH (ref 70–99)

## 2019-07-19 LAB — LIPASE, BLOOD: Lipase: 21 U/L (ref 11–51)

## 2019-07-19 MED ORDER — CLOPIDOGREL BISULFATE 75 MG PO TABS
75.0000 mg | ORAL_TABLET | Freq: Once | ORAL | Status: AC
  Administered 2019-07-19: 75 mg via ORAL
  Filled 2019-07-19: qty 1

## 2019-07-19 MED ORDER — SODIUM CHLORIDE 0.9% FLUSH: 3.0000 mL | Freq: Once | INTRAVENOUS | Status: DC

## 2019-07-19 MED ORDER — SODIUM CHLORIDE 0.9 % IV BOLUS
500.0000 mL | Freq: Once | INTRAVENOUS | Status: AC
  Administered 2019-07-19: 500 mL via INTRAVENOUS

## 2019-07-19 NOTE — Discharge Instructions (Addendum)
At this time there does not appear to be the presence of an emergent medical condition, however there is always the potential for conditions to change. Please read and follow the below instructions.  Please return to the Emergency Department immediately for any new or worsening symptoms. Please be sure to follow up with your Primary Care Provider within one week regarding your visit today; please call their office to schedule an appointment even if you are feeling better for a follow-up visit. Your chest x-ray today showed some pulmonary vascular congestion and bronchiectatic changes.  Please discuss this with your primary care doctor, your cardiologist and your pulmonologist at your follow-up visits.  We recommend that you have a repeat x-ray for reevaluation.  Return immediately to the ER if you develop any chest pain or trouble breathing.  Get help right away if: You have pain in your chest, neck, arm, or jaw. You feel very weak or you pass out (faint). You throw up again and again. You have throw up that is bright red or looks like black coffee grounds. You have bloody or black poop (stools) or poop that looks like tar. You have a very bad headache, a stiff neck, or both. You have very bad pain, cramping, or bloating in your belly (abdomen). You have trouble breathing. You are breathing very quickly. Your heart is beating very quickly. Your skin feels cold and clammy. You feel confused. You have signs of losing too much water in your body, such as: Dark pee, very little pee, or no pee. Cracked lips. Dry mouth. Sunken eyes. Sleepiness. Weakness. You have any new/concerning or worsening of symptoms  Please read the additional information packets attached to your discharge summary.  Do not take your medicine if  develop an itchy rash, swelling in your mouth or lips, or difficulty breathing; call 911 and seek immediate emergency medical attention if this occurs.  Note: Portions of  this text may have been transcribed using voice recognition software. Every effort was made to ensure accuracy; however, inadvertent computerized transcription errors may still be present.

## 2019-07-19 NOTE — ED Provider Notes (Signed)
[facility] DEPT Provider Note   CSN: [id number] Arrival date & time: 07/19/19  7017     History Chief Complaint  Patient presents with  . Emesis  . Fever    Tara Jackson is a 79 y.o. female history of obesity, asthma, CKD, diabetes, hyperlipidemia, hypertension, CVA, DVT/PE.  Patient presents today for concern of dehydration.  Tara Jackson reports that yesterday morning around 9 AM Tara Jackson had a fever of 101 F, Tara Jackson took 650 mg of Tylenol with relief of her symptoms.  Tara Jackson reports later on that day Tara Jackson had one episode of nonbloody/nonbilious emesis without associated abdominal pain.  This has not reoccurred.  Tara Jackson reports that Tara Jackson has been feeling well since yesterday and has not taken any additional antipyretics.  Tara Jackson reports that her only complaint at this time is that Tara Jackson is concerned for dehydration.  On review of symptoms patient does report increased urinary frequency.  Tara Jackson denies continued fever, Tara Jackson denies sore throat, neck stiffness, chest pain, shortness of breath, cough/hemoptysis, abdominal pain, recurrence of vomiting, Tara Jackson denies diarrhea, dysuria/hematuria, fall/injury or any additional concerns.  Of note patient reports Tara Jackson has had both of her Covid vaccines.  HPI     Past Medical History:  Diagnosis Date  . Anemia   . Anxiety   . Arthritis   . Asthma   . Brittle bone disease   . CKD (chronic kidney disease)   . Complication of anesthesia    hard to wake up after anesthesia, trouble turning head  . Coronary artery disease, non-occlusive 2014   a. minimal CAD by cath in 2014 with 40% RI stenosis. b. low-risk NST in 11/2014.  Marland Kitchen Depression   . Diabetes mellitus type 2, insulin dependent ([place])   . Diabetic neuropathy ([place])   . Diabetic retinopathy ([place])    NPDR OU  . Diastolic dysfunction, left ventricle 11/30/14   EF 55%, grade 1 DD  . DVT (deep venous thrombosis) ([place])    "18 in RLE; 7 LLE prior to PE" (03/24/2015)  . Family history of  adverse reaction to anesthesia    " the whole family is hard to wake up"  . Glaucoma   . H/O hiatal hernia   . Hyperlipidemia   . Hypertension associated with diabetes ([place])   . Hypertensive retinopathy    OU  . Hypothyroidism   . Kidney stones    "passed them"  . Macular degeneration    Exu OU  . Memory loss 06/03/2014  . On home oxygen therapy    "2L oxygen concentrator @ night"  . Osteoporosis   . Oxygen desaturation during sleep    wears 2 liters of oxygen at night   . Palpitations 35years ago   Cardionet monitor - revealed mostly normal sinus rhythm, sinus bradycardia with first-degree A-V block heart rates mostly in the 50s and 60s with some 70s. No arrhythmias, PVCs or PACs noted.  . 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 Pneumonia   . Presence of permanent cardiac pacemaker   . Restless legs syndrome   . Sleep apnea    cpap disontinued; test done 07/14/2008 ordered per  [place]  . Spinal headache   . Spinal stenosis    L 3, L 4 and L 5, and C 1, C 2 and C3  . Stroke [facility]) 04-19-2013   tia  and stroke. Weakness Rt hand  . 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 s/p [employer] PPM in 03/2015 by Dr. Lovena Le secondary to sinus node dysfunction.   Marland Kitchen TIA (transient ischemic attack) March 2014    Patient Active Problem List   Diagnosis Date Noted  . Chronic respiratory failure with hypoxia ([place]) 07/26/2018  . Cough variant asthma vs uacs  07/25/2018  . Syncope due to orthostatic hypotension 09/12/2017  . Orthostatic dizziness 06/12/2017  . Diabetic neuropathy ([place]) 12/13/2016  . 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 Acute renal failure superimposed on chronic kidney disease ([place]) 10/24/2016  . Neurocardiogenic syncope 10/23/2016  . Chronic anemia 10/11/2016  . Pacemaker 04/26/2015  . Chronic daily headache 04/07/2015  . Symptomatic bradycardia 03/24/2015    . Sinus node dysfunction ([place]) 03/24/2015  . Chronic diastolic heart failure ([place])   . Diabetes mellitus type 2 in obese ([place])   . Normal coronary arteries 01/14/2015  . Normal cardiac stress test 11/30/2014  . Bradycardia 11/28/2014  . Type 2 diabetes mellitus with renal manifestations ([place]) 11/28/2014  . Morbid obesity ([place]) 11/28/2014  . CKD stage 3 due to type 2 diabetes mellitus ([place]) 11/28/2014  . Memory loss 06/03/2014  . AKI (acute kidney injury) ([place])   . Chest pain 04/19/2014  . Pleuritic chest pain 04/19/2014  . Diabetes mellitus ([place]) 04/19/2014  . Edema of both legs 04/19/2014  . Dyspnea   . DOE (dyspnea on exertion)   . Tremor of right hand 09/20/2013  . Headache(784.0) 09/19/2013  . Restless legs syndrome (RLS) 09/19/2013  . Constipation due to pain medication 08/21/2013  . Ankle edema 08/21/2013  . PVC's (premature ventricular contractions) 02/10/2013  . Diastolic dysfunction, left ventricle   . 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 PULMONARY EMBOLISM, HX OF 10/02/2009  . ALLERGIC RHINITIS 04/07/2009  . Sleep apnea 07/09/2008  . Hyperlipidemia with target LDL less than 130 05/20/2008  . Essential hypertension 05/20/2008    Past Surgical History:  Procedure Laterality Date  . ABDOMINAL HYSTERECTOMY    . APPENDECTOMY    . BACK SURGERY     steroid inj  . CATARACT EXTRACTION Bilateral   . CATARACT EXTRACTION W/ INTRAOCULAR LENS  IMPLANT, BILATERAL Bilateral 2000s  . CHOLECYSTECTOMY    . EP IMPLANTABLE DEVICE N/A 03/24/2015   [facility] [employer] PPM, Dr. Lovena Le  . ESOPHAGOGASTRODUODENOSCOPY  08/09/2011   Procedure: ESOPHAGOGASTRODUODENOSCOPY (EGD);  Surgeon: Jeryl Columbia, MD;  Location: [facility];  Service: Endoscopy;  Laterality: N/A;  . ESOPHAGOGASTRODUODENOSCOPY (EGD) WITH PROPOFOL N/A 11/12/2013   Procedure: ESOPHAGOGASTRODUODENOSCOPY (EGD) WITH PROPOFOL;  Surgeon: Jeryl Columbia, MD;  Location: [facility] ENDOSCOPY;  Service: Endoscopy;  Laterality: N/A;  .  ESOPHAGOGASTRODUODENOSCOPY (EGD) WITH PROPOFOL N/A 05/05/2016   Procedure: ESOPHAGOGASTRODUODENOSCOPY (EGD) WITH PROPOFOL;  Surgeon: Clarene Essex, MD;  Location: [facility] ENDOSCOPY;  Service: Endoscopy;  Laterality: N/A;  . EYE SURGERY Bilateral    Cat Sx OU  . GLAUCOMA SURGERY Bilateral 2000s   "laser"  . HOT HEMOSTASIS  08/09/2011   Procedure: HOT HEMOSTASIS (ARGON PLASMA COAGULATION/BICAP);  Surgeon: Jeryl Columbia, MD;  Location: [facility];  Service: Endoscopy;  Laterality: N/A;  . HOT HEMOSTASIS N/A 11/12/2013   Procedure: HOT HEMOSTASIS (ARGON PLASMA COAGULATION/BICAP);  Surgeon: Jeryl Columbia, MD;  Location: [facility];  Service: Endoscopy;  Laterality: N/A;  . INSERT / REPLACE / REMOVE PACEMAKER  03/24/2015  . IRIDOTOMY / IRIDECTOMY Bilateral   . JOINT REPLACEMENT    . KNEE SURGERY Left done with 2nd  left knee replacement   spacer bar placement  . LEFT HEART CATHETERIZATION WITH CORONARY ANGIOGRAM N/A 02/14/2012   Procedure: LEFT HEART CATHETERIZATION WITH CORONARY ANGIOGRAM;  Surgeon: Leonie Man, MD;  Location[facility] C[facility] LAB;  Service: Cardiovascular:: no evidence of obstructive coronary disease ,low EDP with normal EF  . LEV DOPPLER  05/29/2012   RIGHT EXTREM. NORMAL VENOUS DUPLEX  . NM MYOCAR PERF WALL MOTION  01/26/2012; 11/2014   a. EF 79%,LV normal,ST SEGMENT CHANGE SUGGESTIVE OF ISCHEMIA.; LOW RISK, EF 60%. No ischemia or infarction. No wall motion abnormality   . PACEMAKER PLACEMENT    . REVISION TOTAL KNEE ARTHROPLASTY Left   . TONSILLECTOMY    . TOTAL KNEE ARTHROPLASTY Bilateral   . TRANSTHORACIC ECHOCARDIOGRAM  11/2017   11/2017: EF 60-65%. Gr1 DD. PAP ~33 mmHg.  Mild TR. PPM leads in RA & RV.   Marland Kitchen TRANSTHORACIC ECHOCARDIOGRAM  2014; October 2016   a. EF 40-97%; normal diastolic pressures, no regional wall motion motion abnormalities;; b/ F 55-60%. Normal wall motion. GR 1 DD. No valve lesions  . TRIGGER FINGER RELEASE  11/22/2011   Procedure: RELEASE TRIGGER FINGER/A-1 PULLEY;   Surgeon: Meredith Pel, MD;  Location: [facility];  Service: Orthopedics;  Laterality: Left;  Left trigger thumb release  . TRIGGER FINGER RELEASE Right yrs ago     OB History   No obstetric history on file.     Family History  Problem Relation Age of Onset  . Cancer Mother   . Diabetes Mother   . Cancer - Prostate Father   . Diabetes Father   . Retinal detachment Son   . Diabetes Sister   . Diabetes Brother   . Diabetes Paternal Grandmother     Social History   Tobacco Use  . Smoking status: Never Smoker  . Smokeless tobacco: Never Used  Vaping Use  . Vaping Use: Never used  Substance Use Topics  . Alcohol use: No    Alcohol/week: 0.0 standard drinks  . Drug use: No    Home Medications Prior to Admission medications   Medication Sig Start Date End Date Taking? Authorizing Provider  acetaminophen (TYLENOL) 650 MG CR tablet Take 650 mg by mouth every 8 (eight) hours as needed for pain.   Yes [provider]  albuterol (PROVENTIL HFA;VENTOLIN HFA) 108 (90 BASE) MCG/ACT inhaler Inhale 2 puffs into the lungs every 6 (six) hours as needed for wheezing.   Yes [provider]  budesonide (PULMICORT) 0.25 MG/2ML nebulizer solution One vial twice daily Patient taking differently: Take 0.25 mg by nebulization 2 (two) times daily as needed (shortness of breath).  12/21/18  Yes Tanda Rockers, MD  busPIRone (BUSPAR) 10 MG tablet Take 1 tablet (10 mg total) by mouth every morning. 06/03/14  Yes Garvin Fila, MD  Cholecalciferol (VITAMIN D-3 PO) Take 2 tablets by mouth every morning.    Yes [provider]  clonazePAM (KLONOPIN) 0.5 MG tablet Take 0.5-1 mg by mouth See admin instructions. Takes 1mg  in am and 0.5mg  in pm.   Yes [provider]  clopidogrel (PLAVIX) 75 MG tablet Take 75 mg by mouth every morning.   Yes [provider]  cyanocobalamin 100 MCG tablet Take 100 mcg by mouth daily.   Yes [provider]  dicyclomine  (BENTYL) 10 MG capsule Take 1 capsule (10 mg total) by mouth 3 (three) times daily before meals. Patient taking differently: Take 10 mg by mouth in the morning and  at bedtime.  09/14/17  Yes Vann, Jessica U, DO  donepezil (ARICEPT) 10 MG tablet TAKE 1 TABLET BY MOUTH EVERYDAY AT BEDTIME Patient taking differently: Take 10 mg by mouth at bedtime.  10/01/18  Yes Garvin Fila, MD  famotidine (PEPCID) 20 MG tablet One after supper Patient taking differently: Take 20 mg by mouth daily. One after supper 07/25/18  Yes Tanda Rockers, MD  formoterol (PERFOROMIST) 20 MCG/2ML nebulizer solution Take 2 mLs (20 mcg total) by nebulization 2 (two) times daily. Use in nebulizer twice daily perfectly regularly 12/21/18  Yes Tanda Rockers, MD  furosemide (LASIX) 20 MG tablet Take 1 tablet (20 mg total) by mouth daily as needed for fluid or edema. 06/19/19  Yes Kroeger, Daleen Snook M., PA-C  glipiZIDE (GLUCOTROL) 10 MG tablet Take 20 mg by mouth 2 (two) times daily before a meal.  05/03/14  Yes [provider]  guaiFENesin (MUCINEX) 600 MG 12 hr tablet Take 600 mg by mouth 2 (two) times daily as needed for cough.   Yes [provider]  insulin NPH (HUMULIN N,NOVOLIN N) 100 UNIT/ML injection Inject 0-20 Units into the skin See admin instructions. Novolin-N Give 20 unit in morning; give  5 units at 4pm; evening dose as needed.  If less than 150 blood sugar= 0units.  If greater than 200 blood sugar= give 10units to decrease by 100 blood sugar   Yes [provider]  insulin regular (NOVOLIN R,HUMULIN R) 100 units/mL injection Inject 0-25 Units into the skin See admin instructions. Give 25 unit in morning; give 20units at 4pm; evening dose as needed.  If less than 150 blood sugar= 0units.  If greater than 200 blood sugar= give 10units to decrease by 100 blood sugar   Yes [provider]  levothyroxine (SYNTHROID) 100 MCG tablet Take 100 mcg by mouth daily before breakfast.   Yes [provider]  lidocaine (LIDODERM) 5 % Place 3 patches onto the skin daily as needed (pain). Remove & Discard patch within 12 hours or as directed by MD   Yes [provider]  linaclotide (LINZESS) 290 MCG CAPS capsule Take 290 mcg by mouth daily before breakfast.   Yes [provider]  LORazepam (ATIVAN) 0.5 MG tablet Take 0.5 mg by mouth daily as needed for anxiety.   Yes [provider]  Menthol-Methyl Salicylate (MUSCLE RUB) 10-15 % CREA Apply 1 application topically as needed for muscle pain.   Yes [provider]  midodrine (PROAMATINE) 2.5 MG tablet Take 1 tablet (2.5 mg total) by mouth 3 (three) times daily with meals. Patient taking differently: Take 2.5 mg by mouth as needed. As needed if blood pressure goes to low. 10/19/17  Yes Leonie Man, MD  nortriptyline (PAMELOR) 50 MG capsule TAKE 2 CAPSULES (100MG ) AT BEDTIME Patient taking differently: Take 50 mg by mouth 2 (two) times daily.  06/02/16  Yes Garvin Fila, MD  ondansetron (ZOFRAN) 4 MG tablet Take 4 mg by mouth as needed for nausea or vomiting.  08/16/17  Yes [provider]  pantoprazole (PROTONIX) 40 MG tablet Take 1 tablet (40 mg total) by mouth daily. 06/19/19  Yes Kroeger, Daleen Snook M., PA-C  polyethylene glycol (MIRALAX / GLYCOLAX) packet Take 17 g by mouth daily as needed for mild constipation.    Yes [provider]  potassium chloride SA (K-DUR,KLOR-CON) 20 MEQ tablet Take 1 tablet (20 mEq total) by mouth 2 (two) times daily. 06/22/15  Yes Davonna Belling, MD  Probiotic Product (ALIGN) 4 MG CAPS Take 1 capsule by mouth daily.   Yes [provider]  Pumpkin Seed-Soy Germ (AZO BLADDER CONTROL/GO-LESS PO) Take 1 tablet by mouth daily as needed (bladder control).   Yes [provider]  rosuvastatin (CRESTOR) 20 MG tablet Take 20 mg by mouth daily.  03/13/17  Yes [provider]  solifenacin (VESICARE) 5 MG tablet Take 5 mg by mouth daily. 02/24/19   Yes [provider]  terconazole (TERAZOL 7) 0.4 % vaginal cream Place 1 applicator vaginally daily as needed for itching. 02/12/19  Yes [provider]  OXYGEN Inhale 2 L into the lungs as needed. Used at overnight with a Estate manager/land agent, Historical, MD  predniSONE (DELTASONE) 10 MG tablet TAKE 2 TABS EVERY MORNING UNTIL 100% BETTER THEN TAKE 1 TAB EVERY DAY FOR 1 WEEK THEN 1/2 TAB DAILY Patient not taking: Reported on 07/19/2019 02/25/19   Tanda Rockers, MD    Allergies    Iodine, Iohexol, Metoclopramide hcl, Sulfa antibiotics, Lactose intolerance (gi), and Lansoprazole  Review of Systems   Review of Systems Ten systems are reviewed and are negative for acute change except as noted in the HPI  Physical Exam Updated Vital Signs BP (!) 151/60   Pulse 60   Temp 97.9 F (36.6 C) (Oral)   Resp 20   Ht 5' 7.5" (1.715 m)   Wt 102.1 kg   SpO2 99%   BMI 34.72 kg/m   Physical Exam Constitutional:      General: Tara Jackson is not in acute distress.    Appearance: Normal appearance. Tara Jackson is well-developed. Tara Jackson is obese. Tara Jackson is not ill-appearing or diaphoretic.  HENT:     Head: Normocephalic and atraumatic.  Eyes:     General: Vision grossly intact. Gaze aligned appropriately.     Pupils: Pupils are equal, round, and reactive to light.  Neck:     Trachea: Trachea and phonation normal.  Pulmonary:     Effort: Pulmonary effort is normal. No respiratory distress.  Abdominal:     General: There is no distension.     Palpations: Abdomen is soft.     Tenderness: There is no abdominal tenderness. There is no guarding or rebound.  Musculoskeletal:        General: Normal range of motion.     Cervical back: Normal range of motion.  Skin:    General: Skin is warm and dry.  Neurological:     Mental Status: Tara Jackson is alert.     GCS: GCS eye subscore is 4. GCS verbal subscore is 5. GCS motor subscore is 6.     Comments: Speech is clear and goal oriented, follows  commands Major Cranial nerves without deficit, no facial droop Moves extremities without ataxia, coordination intact  Psychiatric:        Behavior: Behavior normal.     ED Results / Procedures / Treatments   Labs (all labs ordered are listed, but only abnormal results are displayed) Labs Reviewed  COMPREHENSIVE METABOLIC PANEL - Abnormal; Notable for the following components:      Result Value   Chloride 97 (*)    Glucose, Bld 257 (*)    BUN 26 (*)    Creatinine, Ser 1.73 (*)    GFR calc non Af Amer 27 (*)    GFR calc Af Amer 32 (*)    All other components within normal limits  CBC - Abnormal; Notable for the following components:  RBC 3.82 (*)    MCV 100.3 (*)    All other components within normal limits  CBG MONITORING, ED - Abnormal; Notable for the following components:   Glucose-Capillary 235 (*)    All other components within normal limits  LIPASE, BLOOD  URINALYSIS, ROUTINE W REFLEX MICROSCOPIC  TROPONIN I (HIGH SENSITIVITY)  TROPONIN I (HIGH SENSITIVITY)    EKG EKG Interpretation  Date/Time:  Friday July 19 2019 13:12:17 EDT Ventricular Rate:  60 PR Interval:    QRS Duration: 192 QT Interval:  533 QTC Calculation: 533 R Axis:   -81 Text Interpretation: paced rhythm Prolonged PR interval Nonspecific IVCD with LAD Left ventricular hypertrophy Confirmed by Madalyn Rob [phone]) on 07/19/2019 3:38:40 PM   Radiology DG Chest Portable 1 View  Result Date: 07/19/2019 CLINICAL DATA:  Vomiting and fever yesterday, chest pain EXAM: PORTABLE CHEST 1 VIEW COMPARISON:  Portable exam 1303 hours compared to 06/13/2019 FINDINGS: LEFT subclavian sequential transvenous pacemaker leads project at RIGHT atrium and RIGHT ventricle. Enlargement of cardiac silhouette with pulmonary vascular congestion. No definite infiltrate, pleural effusion or pneumothorax. Minimal chronic peribronchial thickening centrally. Osseous structures unremarkable. IMPRESSION: Enlargement of cardiac  silhouette with pulmonary vascular congestion post pacemaker. Bronchitic changes without infiltrate. Electronically Signed   By: Lavonia Dana M.D.   On: 07/19/2019 14:18    Procedures Procedures (including critical care time)  Medications Ordered in ED Medications  sodium chloride flush (NS) 0.9 % injection 3 mL (3 mLs Intravenous Not Given 07/19/19 0958)  sodium chloride 0.9 % bolus 500 mL (0 mLs Intravenous Stopped 07/19/19 1221)  clopidogrel (PLAVIX) tablet 75 mg (75 mg Oral Given 07/19/19 1404)    ED Course  I have reviewed the triage vital signs and the nursing notes.  Pertinent labs & imaging results that were available during my care of the patient were reviewed by me and considered in my medical decision making (see chart for details).    MDM Rules/Calculators/A&P                         Additional History Obtained: 1. Nursing notes from this visit. 2. Previous records within EMR system.  Patient had an pulmonology visit on July 16, 2019, diagnosis was cough variant asthma versus uacs. ----------- Patient arrived with complaint of fever that occurred yesterday morning along with one episode of emesis yesterday.  Her symptoms have been completely resolved since yesterday and Tara Jackson denies use of any antipyretic since yesterday as well.  Tara Jackson has no abdominal pain.  On exam Tara Jackson is well-appearing no acute distress abdomen is soft nontender without peritoneal signs.  Tara Jackson does endorse urinary frequency question possible UTI.  Will obtain abdominal labs and give fluid bolus as Tara Jackson is concerned for dehydration which is reasonable.  Most recent ejection fraction on review of patient's chart shows ejection fraction of 60-65% on October 26, 2017. - I ordered, reviewed and interpreted labs which include: Urinalysis within normal limits no evidence of UTI. CBG of 235. CBC shows no leukocytosis to suggest infection and no evidence of anemia. CMP shows minimal elevation of creatinine today, 1.73  prior 1.6.  No emergent electrolyte derangement evidence of acute kidney injury, emergent elevation of LFTs or anion gap.  Patient does not appear to be in DKA. Lipase within normal limits doubt pancreatitis. - Patient had reassuring work-up received fluid bolus.  Plan of care was discharged back to home, patient was seen and evaluated by Dr. Roslynn Amble who agrees.  I was reviewed patient's labs with her and preparing for discharge when Tara Jackson mentioned chest pain that occurred this a.m.Marland Kitchen  Tara Jackson had previously denied chest pain.  Tara Jackson reports a mild left-sided ache which is reproducible to palpation.  Tara Jackson reports that Tara Jackson believes it is because Tara Jackson not take her Plavix today.  Will obtain troponin, EKG and chest x-ray and give her her home dose of medication. - Initial and delta troponin is within normal limits, 5. EKG: paced rhythm Prolonged PR interval Nonspecific IVCD with LAD Left ventricular hypertrophy Confirmed by Madalyn Rob [phone]) on 07/19/2019 3:38:40 PM CXR:  IMPRESSION:  Enlargement of cardiac silhouette with pulmonary vascular congestion  post pacemaker.    Bronchitic changes without infiltrate.  - Patient was reassessed, Tara Jackson is resting comfortably in bed no acute distress vital signs stable on room air.  Tara Jackson is requesting discharge denies any pain, trouble breathing or concerns at this time. Patient has no shortness of breath, hypoxia or tachycardia to suggest CHF exacerbation, pulmonary embolism or other emergent pathologies at this time.  Tara Jackson is resting comfortably with SPO2 of 99% on room air and no tachycardia.  Discussed case with Dr. Roslynn Amble who agrees with discharge and outpatient PCP follow-up. Her husband is at bedside, they both understand work-up as above and plan to follow-up with PCP for reassessment.   At this time there does not appear to be any evidence of an acute emergency medical condition and the patient appears stable for discharge with appropriate outpatient follow up.  Diagnosis was discussed with patient who verbalizes understanding of care plan and is agreeable to discharge. I have discussed return precautions with patient and husband who verbalizes understanding. Patient encouraged to follow-up with their PCP. All questions answered.   Note: Portions of this report may have been transcribed using voice recognition software. Every effort was made to ensure accuracy; however, inadvertent computerized transcription errors may still be present. Final Clinical Impression(s) / ED Diagnoses Final diagnoses:  Non-intractable vomiting with nausea, unspecified vomiting type    Rx / DC Orders ED Discharge Orders    None       Gari Crown 07/19/19 1548    Lucrezia Starch, MD 07/23/19 [phone]

## 2019-07-19 NOTE — ED Notes (Signed)
Pt unable to void; will continue to monitor.

## 2019-07-19 NOTE — ED Notes (Signed)
ED Provider at bedside. 

## 2019-07-19 NOTE — ED Triage Notes (Signed)
Patient reports that she had emesis and fever yesterday, but none today. Patient states, "I just need some fluid."

## 2019-07-30 ENCOUNTER — Telehealth: Payer: Medicare HMO | Admitting: Medical

## 2019-07-30 ENCOUNTER — Telehealth (INDEPENDENT_AMBULATORY_CARE_PROVIDER_SITE_OTHER): Payer: Medicare HMO | Admitting: Physician Assistant

## 2019-07-30 ENCOUNTER — Encounter: Payer: Self-pay | Admitting: Cardiology

## 2019-07-30 VITALS — BP 130/67 | HR 57 | Ht 67.0 in | Wt 250.0 lb

## 2019-07-30 DIAGNOSIS — Z950 Presence of cardiac pacemaker: Secondary | ICD-10-CM | POA: Diagnosis not present

## 2019-07-30 DIAGNOSIS — R079 Chest pain, unspecified: Secondary | ICD-10-CM | POA: Diagnosis not present

## 2019-07-30 DIAGNOSIS — E119 Type 2 diabetes mellitus without complications: Secondary | ICD-10-CM

## 2019-07-30 DIAGNOSIS — I10 Essential (primary) hypertension: Secondary | ICD-10-CM

## 2019-07-30 DIAGNOSIS — E039 Hypothyroidism, unspecified: Secondary | ICD-10-CM

## 2019-07-30 DIAGNOSIS — N183 Chronic kidney disease, stage 3 unspecified: Secondary | ICD-10-CM

## 2019-07-30 DIAGNOSIS — E1122 Type 2 diabetes mellitus with diabetic chronic kidney disease: Secondary | ICD-10-CM

## 2019-07-30 NOTE — Patient Instructions (Signed)
Medication Instructions:  Your physician recommends that you continue on your current medications as directed. Please refer to the Current Medication list given to you today.  *If you need a refill on your cardiac medications before your next appointment, please call your pharmacy*  Lab Work: NONE ordered at this time of appointment   If you have labs (blood work) drawn today and your tests are completely normal, you will receive your results only by: Marland Kitchen MyChart Message (if you have MyChart) OR . A paper copy in the mail If you have any lab test that is abnormal or we need to change your treatment, we will call you to review the results.  Testing/Procedures: Your physician has requested that you have a lexiscan myoview. For further information please visit [email or web address]. Please follow instruction sheet, as given.   Please schedule for 1-2 weeks   Follow-Up: At [facility], you and your health needs are our priority.  As part of our continuing mission to provide you with exceptional heart care, we have created designated Provider Care Teams.  These Care Teams include your primary Cardiologist (physician) and Advanced Practice Providers (APPs -  Physician Assistants and Nurse Practitioners) who all work together to provide you with the care you need, when you need it.  Your next appointment:   6 month(s)  The format for your next appointment:   In Person  Provider:   Glenetta Hew, MD  Other Instructions

## 2019-07-30 NOTE — Progress Notes (Signed)
Virtual Visit via Telephone Note   This visit type was conducted due to national recommendations for restrictions regarding the COVID-19 Pandemic (e.g. social distancing) in an effort to limit this patient's exposure and mitigate transmission in our community.  Due to her co-morbid illnesses, this patient is at least at moderate risk for complications without adequate follow up.  This format is felt to be most appropriate for this patient at this time.  The patient did not have access to video technology/had technical difficulties with video requiring transitioning to audio format only (telephone).  All issues noted in this document were discussed and addressed.  No physical exam could be performed with this format.  Please refer to the patient's chart for her  consent to telehealth for [facility].   The patient was identified using 2 identifiers.  Date:  08/01/2019   ID:  Tara, Jackson 24-Dec-1940, MRN [id number]  Patient Location: Home Provider Location: Office  PCP:  Shon Baton, MD  Cardiologist:  Glenetta Hew, MD  Electrophysiologist:  Cristopher Peru, MD   Evaluation Performed:  Follow-Up Visit  Chief Complaint:  followup  History of Present Illness:    Tara Jackson is a 79 y.o. female with PMH of SSS s/p PPM, chronic DOE, HTN with orthostatic hypotension, DM II, CKD stage III, CVA, hypothyroidism, anxiety and depression.  Stress test in 2018 was negative.  Echocardiogram obtained in 2019 showed EF 60 to 65%, grade 1 DD, moderate TR, mildly elevated PA pressure.  She has complained of intermittent chest discomfort since earlier this year.  Her last appointment was Roby Lofts on 06/19/2019 who felt her chest pain was quite atypical.  It was associated with epigastric discomfort, nausea and vomiting.  Therefore it was suspected her symptom was GI etiology.  Previously, she was seen in the ED multiple times for similar issue.  EKG shows no ischemic changes.  More recently,  she went to the emergency room again on 07/19/2019 was fever and vomiting.  Urinalysis was negative for UTI.  Lipase was normal.  Creatinine was mildly elevated at 1.73, where his previous creatinine was 1.6.  Patient was hydrated with IV fluid and was discharged.  She presented for cardiology virtual visit today.  She is quite pleasant.  Her family members are coming from [place] to visit her for July 4th.  Unfortunately she continued to have this left-sided chest pain.  This occurs about twice a month.  She does feel that the ache her chest pain is getting more frequent.  The duration of the chest pain can last from a few minutes to hour.  She also has been noticing increasing dyspnea as well.  I recommended proceeding with Myoview.  If Myoview is negative, she can see her primary care doctor to rule out GI etiology.  She can follow-up with Dr. Ellyn Hack in 6 months if the tests come back negative, we plan to bring her back earlier if her test came back positive.  The patient does not have symptoms concerning for COVID-19 infection (fever, chills, cough, or new shortness of breath).    Past Medical History:  Diagnosis Date  . Anemia   . Anxiety   . Arthritis   . Asthma   . Brittle bone disease   . CKD (chronic kidney disease)   . Complication of anesthesia    hard to wake up after anesthesia, trouble turning head  . Coronary artery disease, non-occlusive 2014   a. minimal CAD by  cath in 2014 with 40% RI stenosis. b. low-risk NST in 11/2014.  Marland Kitchen Depression   . Diabetes mellitus type 2, insulin dependent ([place])   . Diabetic neuropathy ([place])   . Diabetic retinopathy ([place])    NPDR OU  . Diastolic dysfunction, left ventricle 11/30/14   EF 55%, grade 1 DD  . DVT (deep venous thrombosis) ([place])    "18 in RLE; 7 LLE prior to PE" (03/24/2015)  . Family history of adverse reaction to anesthesia    " the whole family is hard to wake up"  . Glaucoma   . H/O hiatal hernia   .  Hyperlipidemia   . Hypertension associated with diabetes ([place])   . Hypertensive retinopathy    OU  . Hypothyroidism   . Kidney stones    "passed them"  . Macular degeneration    Exu OU  . Memory loss 06/03/2014  . On home oxygen therapy    "2L oxygen concentrator @ night"  . Osteoporosis   . Oxygen desaturation during sleep    wears 2 liters of oxygen at night   . Palpitations 35years ago   Cardionet monitor - revealed mostly normal sinus rhythm, sinus bradycardia with first-degree A-V block heart rates mostly in the 50s and 60s with some 70s. No arrhythmias, PVCs or PACs noted.  . PE (pulmonary thromboembolism) ([place]) x 3, last one was ~ 2003   history of recurrent RLE DVT with PE - last PE ~>13 yrs; Maintained on Plavix  . Pneumonia   . Presence of permanent cardiac pacemaker   . Restless legs syndrome   . Sleep apnea    cpap disontinued; test done 07/14/2008 ordered per  [place]  . Spinal headache   . Spinal stenosis    L 3, L 4 and L 5, and C 1, C 2 and C3  . Stroke [facility]) 04-19-2013   tia and stroke. Weakness Rt hand  . 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 s/p [employer] PPM in 03/2015 by Dr. Lovena Le secondary to sinus node dysfunction.   Marland Kitchen TIA (transient ischemic attack) March 2014   Past Surgical History:  Procedure Laterality Date  . ABDOMINAL HYSTERECTOMY    . APPENDECTOMY    . BACK SURGERY     steroid inj  . CATARACT EXTRACTION Bilateral   . CATARACT EXTRACTION W/ INTRAOCULAR LENS  IMPLANT, BILATERAL Bilateral 2000s  . CHOLECYSTECTOMY    . EP IMPLANTABLE DEVICE N/A 03/24/2015   [facility] Scientific PPM, Dr. Lovena Le  . ESOPHAGOGASTRODUODENOSCOPY  08/09/2011   Procedure: ESOPHAGOGASTRODUODENOSCOPY (EGD);  Surgeon: Jeryl Columbia, MD;  Location: [facility] ENDOSCOPY;  Service: Endoscopy;  Laterality: N/A;  . ESOPHAGOGASTRODUODENOSCOPY (EGD) WITH PROPOFOL N/A 11/12/2013   Procedure: ESOPHAGOGASTRODUODENOSCOPY (EGD) WITH PROPOFOL;  Surgeon: Jeryl Columbia, MD;  Location: [facility]  ENDOSCOPY;  Service: Endoscopy;  Laterality: N/A;  . ESOPHAGOGASTRODUODENOSCOPY (EGD) WITH PROPOFOL N/A 05/05/2016   Procedure: ESOPHAGOGASTRODUODENOSCOPY (EGD) WITH PROPOFOL;  Surgeon: Clarene Essex, MD;  Location: [facility] ENDOSCOPY;  Service: Endoscopy;  Laterality: N/A;  . EYE SURGERY Bilateral    Cat Sx OU  . GLAUCOMA SURGERY Bilateral 2000s   "laser"  . HOT HEMOSTASIS  08/09/2011   Procedure: HOT HEMOSTASIS (ARGON PLASMA COAGULATION/BICAP);  Surgeon: Jeryl Columbia, MD;  Location: [facility] ENDOSCOPY;  Service: Endoscopy;  Laterality: N/A;  . HOT HEMOSTASIS N/A 11/12/2013   Procedure: HOT HEMOSTASIS (ARGON PLASMA COAGULATION/BICAP);  Surgeon: Jeryl Columbia, MD;  Location: [facility] ENDOSCOPY;  Service: Endoscopy;  Laterality: N/A;  . INSERT /  REPLACE / REMOVE PACEMAKER  03/24/2015  . IRIDOTOMY / IRIDECTOMY Bilateral   . JOINT REPLACEMENT    . KNEE SURGERY Left done with 2nd left knee replacement   spacer bar placement  . LEFT HEART CATHETERIZATION WITH CORONARY ANGIOGRAM N/A 02/14/2012   Procedure: LEFT HEART CATHETERIZATION WITH CORONARY ANGIOGRAM;  Surgeon: Leonie Man, MD;  Location: [facility] CATH LAB;  Service: Cardiovascular:: no evidence of obstructive coronary disease ,low EDP with normal EF  . LEV DOPPLER  05/29/2012   RIGHT EXTREM. NORMAL VENOUS DUPLEX  . NM MYOCAR PERF WALL MOTION  01/26/2012; 11/2014   a. EF 79%,LV normal,ST SEGMENT CHANGE SUGGESTIVE OF ISCHEMIA.; LOW RISK, EF 60%. No ischemia or infarction. No wall motion abnormality   . PACEMAKER PLACEMENT    . REVISION TOTAL KNEE ARTHROPLASTY Left   . TONSILLECTOMY    . TOTAL KNEE ARTHROPLASTY Bilateral   . TRANSTHORACIC ECHOCARDIOGRAM  11/2017   11/2017: EF 60-65%. Gr1 DD. PAP ~33 mmHg.  Mild TR. PPM leads in RA & RV.   Marland Kitchen TRANSTHORACIC ECHOCARDIOGRAM  2014; October 2016   a. EF 81-01%; normal diastolic pressures, no regional wall motion motion abnormalities;; b/ F 55-60%. Normal wall motion. GR 1 DD. No valve lesions  . TRIGGER FINGER RELEASE   11/22/2011   Procedure: RELEASE TRIGGER FINGER/A-1 PULLEY;  Surgeon: Meredith Pel, MD;  Location: [facility];  Service: Orthopedics;  Laterality: Left;  Left trigger thumb release  . TRIGGER FINGER RELEASE Right yrs ago     Current Meds  Medication Sig  . acetaminophen (TYLENOL) 650 MG CR tablet Take 650 mg by mouth every 8 (eight) hours as needed for pain.  Marland Kitchen albuterol (PROVENTIL HFA;VENTOLIN HFA) 108 (90 BASE) MCG/ACT inhaler Inhale 2 puffs into the lungs every 6 (six) hours as needed for wheezing.  . budesonide (PULMICORT) 0.25 MG/2ML nebulizer solution One vial twice daily (Patient taking differently: Take 0.25 mg by nebulization 2 (two) times daily as needed (shortness of breath). )  . busPIRone (BUSPAR) 10 MG tablet Take 1 tablet (10 mg total) by mouth every morning.  . Cholecalciferol (VITAMIN D-3 PO) Take 2 tablets by mouth every morning.   . clonazePAM (KLONOPIN) 0.5 MG tablet Take 0.5-1 mg by mouth See admin instructions. Takes 1mg  in am and 0.5mg  in pm.  . clopidogrel (PLAVIX) 75 MG tablet Take 75 mg by mouth every morning.  . cyanocobalamin 100 MCG tablet Take 100 mcg by mouth daily.  Marland Kitchen dicyclomine (BENTYL) 10 MG capsule Take 1 capsule (10 mg total) by mouth 3 (three) times daily before meals. (Patient taking differently: Take 10 mg by mouth in the morning and at bedtime. )  . donepezil (ARICEPT) 10 MG tablet TAKE 1 TABLET BY MOUTH EVERYDAY AT BEDTIME (Patient taking differently: Take 10 mg by mouth at bedtime. )  . famotidine (PEPCID) 20 MG tablet One after supper (Patient taking differently: Take 20 mg by mouth daily. One after supper)  . formoterol (PERFOROMIST) 20 MCG/2ML nebulizer solution Take 2 mLs (20 mcg total) by nebulization 2 (two) times daily. Use in nebulizer twice daily perfectly regularly  . glipiZIDE (GLUCOTROL) 10 MG tablet Take 20 mg by mouth 2 (two) times daily before a meal.   . guaiFENesin (MUCINEX) 600 MG 12 hr tablet Take 600 mg by mouth 2 (two) times daily as  needed for cough.  . insulin NPH (HUMULIN N,NOVOLIN N) 100 UNIT/ML injection Inject 0-20 Units into the skin See admin instructions. Novolin-N Give 20 unit in morning;  give  5 units at 4pm; evening dose as needed.  If less than 150 blood sugar= 0units.  If greater than 200 blood sugar= give 10units to decrease by 100 blood sugar  . insulin regular (NOVOLIN R,HUMULIN R) 100 units/mL injection Inject 0-25 Units into the skin See admin instructions. Give 25 unit in morning; give 20units at 4pm; evening dose as needed.  If less than 150 blood sugar= 0units.  If greater than 200 blood sugar= give 10units to decrease by 100 blood sugar  . levothyroxine (SYNTHROID) 100 MCG tablet Take 100 mcg by mouth daily before breakfast.  . lidocaine (LIDODERM) 5 % Place 3 patches onto the skin daily as needed (pain). Remove & Discard patch within 12 hours or as directed by MD  . linaclotide (LINZESS) 290 MCG CAPS capsule Take 290 mcg by mouth daily before breakfast.  . LORazepam (ATIVAN) 0.5 MG tablet Take 0.5 mg by mouth daily as needed for anxiety.  . Menthol-Methyl Salicylate (MUSCLE RUB) 10-15 % CREA Apply 1 application topically as needed for muscle pain.  . midodrine (PROAMATINE) 2.5 MG tablet Take 1 tablet (2.5 mg total) by mouth 3 (three) times daily with meals. (Patient taking differently: Take 2.5 mg by mouth as needed. As needed if blood pressure goes to low.)  . nortriptyline (PAMELOR) 50 MG capsule TAKE 2 CAPSULES (100MG ) AT BEDTIME (Patient taking differently: Take 50 mg by mouth 2 (two) times daily. )  . ondansetron (ZOFRAN) 4 MG tablet Take 4 mg by mouth as needed for nausea or vomiting.   . OXYGEN Inhale 2 L into the lungs as needed. Used at overnight with a concentrator   . pantoprazole (PROTONIX) 40 MG tablet Take 1 tablet (40 mg total) by mouth daily.  . polyethylene glycol (MIRALAX / GLYCOLAX) packet Take 17 g by mouth daily as needed for mild constipation.   . potassium chloride SA (K-DUR,KLOR-CON)  20 MEQ tablet Take 1 tablet (20 mEq total) by mouth 2 (two) times daily.  . Probiotic Product (ALIGN) 4 MG CAPS Take 1 capsule by mouth daily.  . Pumpkin Seed-Soy Germ (AZO BLADDER CONTROL/GO-LESS PO) Take 1 tablet by mouth daily as needed (bladder control).  . rosuvastatin (CRESTOR) 20 MG tablet Take 20 mg by mouth daily.   . solifenacin (VESICARE) 5 MG tablet Take 5 mg by mouth daily.  Marland Kitchen terconazole (TERAZOL 7) 0.4 % vaginal cream Place 1 applicator vaginally daily as needed for itching.     Allergies:   Iodine, Iohexol, Metoclopramide hcl, Sulfa antibiotics, Lactose intolerance (gi), and Lansoprazole   Social History   Tobacco Use  . Smoking status: Never Smoker  . Smokeless tobacco: Never Used  Vaping Use  . Vaping Use: Never used  Substance Use Topics  . Alcohol use: No    Alcohol/week: 0.0 standard drinks  . Drug use: No     Family Hx: The patient's family history includes Cancer in her mother; Cancer - Prostate in her father; Diabetes in her brother, father, mother, paternal grandmother, and sister; Retinal detachment in her son.  ROS:   Please see the history of present illness.     All other systems reviewed and are negative.   Prior CV studies:   The following studies were reviewed today:  Echo 10/26/2017 LV EF: 60% -  65%   -------------------------------------------------------------------  Indications:   CHF (I50).   -------------------------------------------------------------------  History:  PMH: Edema. Dyspnea.   -------------------------------------------------------------------  Study Conclusions   - Left ventricle: The cavity size  was normal. There was mild  concentric hypertrophy. Systolic function was normal. The  estimated ejection fraction was in the range of 60% to 65%. Wall  motion was normal; there were no regional wall motion  abnormalities. Doppler parameters are consistent with abnormal  left ventricular relaxation  (grade 1 diastolic dysfunction).  Doppler parameters are consistent with elevated ventricular  end-diastolic filling pressure.  - Mitral valve: There was no regurgitation.  - Right ventricle: Pacer wire or catheter noted in right ventricle.  Systolic function was normal.  - Right atrium: Pacer wire or catheter noted in right atrium.  - Tricuspid valve: There was mild regurgitation.  - Pulmonary arteries: Systolic pressure was mildly increased. PA  peak pressure: 33 mm Hg (S).  - Inferior vena cava: The vessel was normal in size. The  respirophasic diameter changes were in the normal range (= 50%),  consistent with normal central venous pressure.  - Pericardium, extracardiac: There was no pericardial effusion.  Labs/Other Tests and Data Reviewed:    EKG:  An ECG dated 07/19/2019 was personally reviewed today and demonstrated:  Paced rhythm  Recent Labs: 03/22/2019: B Natriuretic Peptide 28.1 07/19/2019: ALT 12; Hemoglobin 12.3; Platelets 237 07/31/2019: BUN 19; Creatinine, Ser 1.42; Potassium 4.0; Pro B Natriuretic peptide (BNP) 30.0; Sodium 136   Recent Lipid Panel Lab Results  Component Value Date/Time   CHOL 125 04/20/2012 05:14 AM   TRIG 168 (H) 04/20/2012 05:14 AM   HDL 49 04/20/2012 05:14 AM   CHOLHDL 2.6 04/20/2012 05:14 AM   LDLCALC 42 04/20/2012 05:14 AM    Wt Readings from Last 3 Encounters:  07/31/19 260 lb (117.9 kg)  07/30/19 250 lb (113.4 kg)  07/19/19 225 lb (102.1 kg)     Objective:    Vital Signs:  BP 130/67   Pulse (!) 57   Ht 5\' 7"  (1.702 m)   Wt 250 lb (113.4 kg)   BMI 39.16 kg/m    VITAL SIGNS:  reviewed  ASSESSMENT & PLAN:    1. Chest pain: We will proceed with Myoview.  If Myoview is negative, patient can follow-up with Dr. Ellyn Hack in 6 months.  2. SSS s/p pacemaker: Followed by EP service  3. Orthostatic hypotension: On as needed dose of midodrine  4. DM 2: Managed by primary care provider  5. CKD stage III: Stable on last  lab work.  6. Hypothyroidism: On levothyroxine.  COVID-19 Education: The signs and symptoms of COVID-19 were discussed with the patient and how to seek care for testing (follow up with PCP or arrange E-visit).  The importance of social distancing was discussed today.  Time:   Today, I have spent 14 minutes with the patient with telehealth technology discussing the above problems.     Medication Adjustments/Labs and Tests Ordered: Current medicines are reviewed at length with the patient today.  Concerns regarding medicines are outlined above.   Tests Ordered: Orders Placed This Encounter  Procedures  . MYOCARDIAL PERFUSION IMAGING    Medication Changes: No orders of the defined types were placed in this encounter.   Follow Up:  Either In Person or Virtual in 6 month(s)  Signed, Almyra Deforest, [place]  08/01/2019 11:26 PM    [facility]

## 2019-07-31 ENCOUNTER — Telehealth (HOSPITAL_COMMUNITY): Payer: Self-pay | Admitting: *Deleted

## 2019-07-31 ENCOUNTER — Encounter: Payer: Self-pay | Admitting: Primary Care

## 2019-07-31 ENCOUNTER — Ambulatory Visit: Payer: Medicare HMO | Admitting: Primary Care

## 2019-07-31 ENCOUNTER — Other Ambulatory Visit: Payer: Self-pay

## 2019-07-31 ENCOUNTER — Encounter (HOSPITAL_COMMUNITY): Payer: Self-pay | Admitting: *Deleted

## 2019-07-31 VITALS — BP 140/70 | HR 62 | Temp 97.3°F | Ht 67.5 in | Wt 260.0 lb

## 2019-07-31 DIAGNOSIS — R0609 Other forms of dyspnea: Secondary | ICD-10-CM

## 2019-07-31 DIAGNOSIS — J45991 Cough variant asthma: Secondary | ICD-10-CM | POA: Diagnosis not present

## 2019-07-31 DIAGNOSIS — R0600 Dyspnea, unspecified: Secondary | ICD-10-CM

## 2019-07-31 DIAGNOSIS — R682 Dry mouth, unspecified: Secondary | ICD-10-CM | POA: Diagnosis not present

## 2019-07-31 DIAGNOSIS — I5032 Chronic diastolic (congestive) heart failure: Secondary | ICD-10-CM | POA: Diagnosis not present

## 2019-07-31 LAB — BASIC METABOLIC PANEL
BUN: 19 mg/dL (ref 6–23)
CO2: 29 mEq/L (ref 19–32)
Calcium: 9.1 mg/dL (ref 8.4–10.5)
Chloride: 101 mEq/L (ref 96–112)
Creatinine, Ser: 1.42 mg/dL — ABNORMAL HIGH (ref 0.40–1.20)
GFR: 42.98 mL/min — ABNORMAL LOW (ref >60.00)
Glucose, Bld: 249 mg/dL — ABNORMAL HIGH (ref 70–99)
Potassium: 4 mEq/L (ref 3.5–5.1)
Sodium: 136 mEq/L (ref 135–145)

## 2019-07-31 LAB — BRAIN NATRIURETIC PEPTIDE: Pro B Natriuretic peptide (BNP): 30 pg/mL (ref 0.0–100.0)

## 2019-07-31 MED ORDER — PREDNISONE 10 MG PO TABS: ORAL_TABLET | ORAL | 0 refills | Status: DC

## 2019-07-31 NOTE — Progress Notes (Signed)
@Patient  ID: Tara Jackson, female    DOB: 18-Apr-1940, 79 y.o.   MRN: [id number]  Chief Complaint  Patient presents with  . Follow-up    cough/asthma, cough is better, still having nasal drip    Referring provider: Shon Baton, MD  HPI: 79 year old female, never smoked.  Past medical history significant for cough variant asthma, chronic respiratory failure with hypoxia, sleep apnea, allergic rhinitis, chronic diastolic heart failure, hypertension, syncope, chronic kidney disease stage III, diabetes mellitus type 2.  Patient of Dr. Melvyn Novas.   07/31/2019  Patient presents today for regular follow-up. She is doing ok, states that her cough is some better. She is still experiencing post nasal drip symptoms during the day. Her cough is worse at night.  She is having some memory issues, recently started Aricept. She takes clonazepam 0.5mg  twice a day and has only used ativan once this past year. She is requesting a refill of medication, they were prescribed by Dr. Virgina Jock and she will contact his office for refill.   07/19/19 CXR: Enlargement of cardiac silhouette with pulmonary vascular congestion post pacemaker.Bronchitic changes without infiltrate.  Allergies  Allergen Reactions  . Iodine Other (See Comments)    ONLY IV--Makes unconscious  . Iohexol Other (See Comments)     Code: SOB, Desc: cardiac arrest w/ iv contrast, has never used 13 hr prep//alice calhoun, Onset Date: [id number]   . Metoclopramide Hcl Other (See Comments)    suicidal  . Sulfa Antibiotics Hives  . Lactose Intolerance (Gi) Diarrhea  . Lansoprazole Nausea And Vomiting    Immunization History  Administered Date(s) Administered  . Influenza Whole 10/22/2009  . Influenza, High Dose Seasonal PF 10/24/2016, 11/07/2017  . Influenza-Unspecified 12/09/2014, 11/08/2018  . PFIZER SARS-COV-2 Vaccination 03/12/2019, 04/02/2019  . Pneumococcal Polysaccharide-23 02/07/2005    Past Medical History:  Diagnosis Date  . Anemia    . Anxiety   . Arthritis   . Asthma   . Brittle bone disease   . CKD (chronic kidney disease)   . Complication of anesthesia    hard to wake up after anesthesia, trouble turning head  . Coronary artery disease, non-occlusive 2014   a. minimal CAD by cath in 2014 with 40% RI stenosis. b. low-risk NST in 11/2014.  Marland Kitchen Depression   . Diabetes mellitus type 2, insulin dependent ([place])   . Diabetic neuropathy ([place])   . Diabetic retinopathy ([place])    NPDR OU  . Diastolic dysfunction, left ventricle 11/30/14   EF 55%, grade 1 DD  . DVT (deep venous thrombosis) ([place])    "18 in RLE; 7 LLE prior to PE" (03/24/2015)  . Family history of adverse reaction to anesthesia    " the whole family is hard to wake up"  . Glaucoma   . H/O hiatal hernia   . Hyperlipidemia   . Hypertension associated with diabetes ([place])   . Hypertensive retinopathy    OU  . Hypothyroidism   . Kidney stones    "passed them"  . Macular degeneration    Exu OU  . Memory loss 06/03/2014  . On home oxygen therapy    "2L oxygen concentrator @ night"  . Osteoporosis   . Oxygen desaturation during sleep    wears 2 liters of oxygen at night   . Palpitations 35years ago   Cardionet monitor - revealed mostly normal sinus rhythm, sinus bradycardia with first-degree A-V block heart rates mostly in the 50s and 60s with some 70s. No arrhythmias, PVCs or  PACs noted.  . PE (pulmonary thromboembolism) ([place]) x 3, last one was ~ 2003   history of recurrent RLE DVT with PE - last PE ~>13 yrs; Maintained on Plavix  . Pneumonia   . Presence of permanent cardiac pacemaker   . Restless legs syndrome   . Sleep apnea    cpap disontinued; test done 07/14/2008 ordered per  [facility]  . Spinal headache   . Spinal stenosis    L 3, L 4 and L 5, and C 1, C 2 and C3  . Stroke [facility]) 04-19-2013   tia and stroke. Weakness Rt hand  . 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 s/p [employer] PPM in 03/2015 by Dr. Lovena Le secondary to sinus node  dysfunction.   Marland Kitchen TIA (transient ischemic attack) March 2014    Tobacco History: Social History   Tobacco Use  Smoking Status Never Smoker  Smokeless Tobacco Never Used   Counseling given: Not Answered   Outpatient Medications Prior to Visit  Medication Sig Dispense Refill  . acetaminophen (TYLENOL) 650 MG CR tablet Take 650 mg by mouth every 8 (eight) hours as needed for pain.    Marland Kitchen albuterol (PROVENTIL HFA;VENTOLIN HFA) 108 (90 BASE) MCG/ACT inhaler Inhale 2 puffs into the lungs every 6 (six) hours as needed for wheezing.    . budesonide (PULMICORT) 0.25 MG/2ML nebulizer solution One vial twice daily (Patient taking differently: Take 0.25 mg by nebulization 2 (two) times daily as needed (shortness of breath). ) 120 mL 12  . busPIRone (BUSPAR) 10 MG tablet Take 1 tablet (10 mg total) by mouth every morning. 60 tablet 1  . Cholecalciferol (VITAMIN D-3 PO) Take 2 tablets by mouth every morning.     . clonazePAM (KLONOPIN) 0.5 MG tablet Take 0.5-1 mg by mouth See admin instructions. Takes 1mg  in am and 0.5mg  in pm.    . clopidogrel (PLAVIX) 75 MG tablet Take 75 mg by mouth every morning.    . cyanocobalamin 100 MCG tablet Take 100 mcg by mouth daily.    Marland Kitchen dicyclomine (BENTYL) 10 MG capsule Take 1 capsule (10 mg total) by mouth 3 (three) times daily before meals. (Patient taking differently: Take 10 mg by mouth in the morning and at bedtime. )    . donepezil (ARICEPT) 10 MG tablet TAKE 1 TABLET BY MOUTH EVERYDAY AT BEDTIME (Patient taking differently: Take 10 mg by mouth at bedtime. ) 90 tablet 1  . famotidine (PEPCID) 20 MG tablet One after supper (Patient taking differently: Take 20 mg by mouth daily. One after supper) 30 tablet 11  . formoterol (PERFOROMIST) 20 MCG/2ML nebulizer solution Take 2 mLs (20 mcg total) by nebulization 2 (two) times daily. Use in nebulizer twice daily perfectly regularly 120 mL 11  . furosemide (LASIX) 20 MG tablet Take 1 tablet (20 mg total) by mouth daily as  needed for fluid or edema. 90 tablet 1  . glipiZIDE (GLUCOTROL) 10 MG tablet Take 20 mg by mouth 2 (two) times daily before a meal.     . guaiFENesin (MUCINEX) 600 MG 12 hr tablet Take 600 mg by mouth 2 (two) times daily as needed for cough.    . insulin NPH (HUMULIN N,NOVOLIN N) 100 UNIT/ML injection Inject 0-20 Units into the skin See admin instructions. Novolin-N Give 20 unit in morning; give  5 units at 4pm; evening dose as needed.  If less than 150 blood sugar= 0units.  If greater than 200 blood sugar= give 10units to decrease by 100  blood sugar    . insulin regular (NOVOLIN R,HUMULIN R) 100 units/mL injection Inject 0-25 Units into the skin See admin instructions. Give 25 unit in morning; give 20units at 4pm; evening dose as needed.  If less than 150 blood sugar= 0units.  If greater than 200 blood sugar= give 10units to decrease by 100 blood sugar    . levothyroxine (SYNTHROID) 100 MCG tablet Take 100 mcg by mouth daily before breakfast.    . lidocaine (LIDODERM) 5 % Place 3 patches onto the skin daily as needed (pain). Remove & Discard patch within 12 hours or as directed by MD    . linaclotide (LINZESS) 290 MCG CAPS capsule Take 290 mcg by mouth daily before breakfast.    . LORazepam (ATIVAN) 0.5 MG tablet Take 0.5 mg by mouth daily as needed for anxiety.    . Menthol-Methyl Salicylate (MUSCLE RUB) 10-15 % CREA Apply 1 application topically as needed for muscle pain.    . midodrine (PROAMATINE) 2.5 MG tablet Take 1 tablet (2.5 mg total) by mouth 3 (three) times daily with meals. (Patient taking differently: Take 2.5 mg by mouth as needed. As needed if blood pressure goes to low.) 90 tablet 6  . nortriptyline (PAMELOR) 50 MG capsule TAKE 2 CAPSULES (100MG ) AT BEDTIME (Patient taking differently: Take 50 mg by mouth 2 (two) times daily. ) 180 capsule 1  . ondansetron (ZOFRAN) 4 MG tablet Take 4 mg by mouth as needed for nausea or vomiting.   0  . OXYGEN Inhale 2 L into the lungs as needed. Used  at overnight with a concentrator     . pantoprazole (PROTONIX) 40 MG tablet Take 1 tablet (40 mg total) by mouth daily. 90 tablet 3  . polyethylene glycol (MIRALAX / GLYCOLAX) packet Take 17 g by mouth daily as needed for mild constipation.     . potassium chloride SA (K-DUR,KLOR-CON) 20 MEQ tablet Take 1 tablet (20 mEq total) by mouth 2 (two) times daily. 10 tablet 0  . Probiotic Product (ALIGN) 4 MG CAPS Take 1 capsule by mouth daily.    . Pumpkin Seed-Soy Germ (AZO BLADDER CONTROL/GO-LESS PO) Take 1 tablet by mouth daily as needed (bladder control).    . rosuvastatin (CRESTOR) 20 MG tablet Take 20 mg by mouth daily.     . solifenacin (VESICARE) 5 MG tablet Take 5 mg by mouth daily.    Marland Kitchen terconazole (TERAZOL 7) 0.4 % vaginal cream Place 1 applicator vaginally daily as needed for itching.     No facility-administered medications prior to visit.    Review of Systems  Review of Systems  HENT: Positive for postnasal drip.   Respiratory: Positive for cough.   Psychiatric/Behavioral:       Memory issues   Physical Exam  BP 140/70 (BP Location: Left Arm, Cuff Size: Large)   Pulse 62   Temp (!) 97.3 F (36.3 C) (Oral)   Ht 5' 7.5" (1.715 m)   Wt 260 lb (117.9 kg)   SpO2 100%   BMI 40.12 kg/m  Physical Exam Constitutional:      Appearance: Normal appearance.  HENT:     Head: Normocephalic and atraumatic.  Cardiovascular:     Rate and Rhythm: Normal rate and regular rhythm.  Pulmonary:     Effort: Pulmonary effort is normal.  Neurological:     General: No focal deficit present.     Mental Status: She is alert.  Psychiatric:        Mood and  Affect: Mood normal.        Behavior: Behavior normal.      Lab Results:  CBC    Component Value Date/Time   WBC 6.6 07/19/2019 0929   RBC 3.82 (L) 07/19/2019 0929   HGB 12.3 07/19/2019 0929   HCT 38.3 07/19/2019 0929   PLT 237 07/19/2019 0929   MCV 100.3 (H) 07/19/2019 0929   MCH 32.2 07/19/2019 0929   MCHC 32.1 07/19/2019  0929   RDW 11.9 07/19/2019 0929   LYMPHSABS 1.2 03/19/2019 1311   MONOABS 0.2 03/19/2019 1311   EOSABS 0.1 03/19/2019 1311   BASOSABS 0.1 03/19/2019 1311    BMET    Component Value Date/Time   NA 136 07/31/2019 1226   K 4.0 07/31/2019 1226   CL 101 07/31/2019 1226   CO2 29 07/31/2019 1226   GLUCOSE 249 (H) 07/31/2019 1226   BUN 19 07/31/2019 1226   CREATININE 1.42 (H) 07/31/2019 1226   CREATININE 1.64 (H) 04/09/2015 1438   CALCIUM 9.1 07/31/2019 1226   GFRNONAA 27 (L) 07/19/2019 0929   GFRAA 32 (L) 07/19/2019 0929    BNP    Component Value Date/Time   BNP 28.1 03/22/2019 1502    ProBNP    Component Value Date/Time   PROBNP 30.0 07/31/2019 1226    Imaging: DG Chest Portable 1 View  Result Date: 07/19/2019 CLINICAL DATA:  Vomiting and fever yesterday, chest pain EXAM: PORTABLE CHEST 1 VIEW COMPARISON:  Portable exam 1303 hours compared to 06/13/2019 FINDINGS: LEFT subclavian sequential transvenous pacemaker leads project at RIGHT atrium and RIGHT ventricle. Enlargement of cardiac silhouette with pulmonary vascular congestion. No definite infiltrate, pleural effusion or pneumothorax. Minimal chronic peribronchial thickening centrally. Osseous structures unremarkable. IMPRESSION: Enlargement of cardiac silhouette with pulmonary vascular congestion post pacemaker. Bronchitic changes without infiltrate. Electronically Signed   By: Lavonia Dana M.D.   On: 07/19/2019 14:18     Assessment & Plan:   Cough variant asthma vs uacs  - Cough is some better but continues to be significant at night - Recommend patient take prednisone as instructed below - Try  chlorphenirmine 4mg  tablet every 4-6 hours for cough (chlor-a-tabs)  Prednisone: - TAKE 2 tablets daily until better - Then decrease to 1 tablet daily x 5 days - Then decrease to 1/2 tablet daily   Chronic diastolic heart failure (HCC) - Recommend patient take prn lasix 1-2 times over the next week for leg swelling/edema    -Checking BNP and BMET  Dry mouth - Recommend patient use sugar free candy lozenges, take sips of water throughout the day and try biotene mouth wash       Martyn Ehrich, NP 08/12/2019

## 2019-07-31 NOTE — Patient Instructions (Addendum)
Cough: - Take prednisone as instructed below - You can get over the counter medication called chlorphenirmine 4mg  tablet every 4-6 hours for cough (chlor-a-tabs)  Prednisone: - TAKE 2 tablets daily until better - Then decrease to 1 tablet daily x 5 days - Then decrease to 1/2 tablet daily   Dry mouth: - Use sugar free candy lozenges - Take sips of water throughout the day - You can look at getting mouth wash from drug store for dry mouth (Biotene)  Leg swelling: - Take lasix 1-2 times over the next week for swelling/edema   Hypothyroidism: - Make sure you are taking Synthroid 116mcg   Orders: Labs today (BNP/ BMET)  Follow-up: 3 months or sooner if needed

## 2019-07-31 NOTE — Telephone Encounter (Signed)
Left message on voicemail per DPR in reference to upcoming appointment scheduled on 08/05/2019 at 100pm with detailed instructions given per Myocardial Perfusion Study Information Sheet for the test. LM to arrive 15 minutes early, and that it is imperative to arrive on time for appointment to keep from having the test rescheduled. If you need to cancel or reschedule your appointment, please call the office within 24 hours of your appointment. Failure to do so may result in a cancellation of your appointment, and a $50 no show fee. Phone number given for call back for any questions.   Mychart letter sent with instructions. Breindel Collier, Ranae Palms

## 2019-08-01 NOTE — Progress Notes (Signed)
Please let patient know her BNP was normal. The rest of her labs were baseline.

## 2019-08-05 ENCOUNTER — Ambulatory Visit (HOSPITAL_COMMUNITY): Payer: Medicare HMO

## 2019-08-06 ENCOUNTER — Ambulatory Visit (HOSPITAL_COMMUNITY): Payer: Medicare HMO

## 2019-08-08 HISTORY — PX: NM MYOCAR PERF WALL MOTION: HXRAD629

## 2019-08-12 DIAGNOSIS — R682 Dry mouth, unspecified: Secondary | ICD-10-CM | POA: Insufficient documentation

## 2019-08-12 NOTE — Assessment & Plan Note (Signed)
-   Recommend patient use sugar free candy lozenges, take sips of water throughout the day and try biotene mouth wash

## 2019-08-12 NOTE — Assessment & Plan Note (Signed)
-   Cough is some better but continues to be significant at night - Recommend patient take prednisone as instructed below - Try  chlorphenirmine 4mg  tablet every 4-6 hours for cough (chlor-a-tabs)  Prednisone: - TAKE 2 tablets daily until better - Then decrease to 1 tablet daily x 5 days - Then decrease to 1/2 tablet daily

## 2019-08-12 NOTE — Assessment & Plan Note (Signed)
-   Recommend patient take prn lasix 1-2 times over the next week for leg swelling/edema  -Checking BNP and BMET

## 2019-08-15 ENCOUNTER — Telehealth (HOSPITAL_COMMUNITY): Payer: Self-pay | Admitting: *Deleted

## 2019-08-15 NOTE — Telephone Encounter (Signed)
Left message on voicemail per DPR in reference to upcoming appointment scheduled on 08/21/19 with detailed instructions given per Myocardial Perfusion Study Information Sheet for the test. LM to arrive 15 minutes early, and that it is imperative to arrive on time for appointment to keep from having the test rescheduled. If you need to cancel or reschedule your appointment, please call the office within 24 hours of your appointment. Failure to do so may result in a cancellation of your appointment, and a $50 no show fee. Phone number given for call back for any questions. Kirstie Peri

## 2019-08-16 ENCOUNTER — Ambulatory Visit (HOSPITAL_COMMUNITY): Payer: Medicare HMO

## 2019-08-18 ENCOUNTER — Other Ambulatory Visit: Payer: Self-pay | Admitting: Internal Medicine

## 2019-08-18 DIAGNOSIS — J45991 Cough variant asthma: Secondary | ICD-10-CM

## 2019-08-20 ENCOUNTER — Telehealth: Payer: Self-pay | Admitting: Cardiology

## 2019-08-20 NOTE — Telephone Encounter (Signed)
Gabriella from [facility] calling stating the patient contacted them to verify whether her 2 day stress test will be covered. She states the prior authorization was approved for a 1 day stress test. She states to contact their evicore department at [phone] to verify.

## 2019-08-21 ENCOUNTER — Ambulatory Visit (HOSPITAL_COMMUNITY): Payer: Medicare HMO | Attending: Cardiovascular Disease

## 2019-08-21 ENCOUNTER — Other Ambulatory Visit: Payer: Self-pay

## 2019-08-21 DIAGNOSIS — R079 Chest pain, unspecified: Secondary | ICD-10-CM | POA: Insufficient documentation

## 2019-08-21 MED ORDER — TECHNETIUM TC 99M TETROFOSMIN IV KIT
32.7000 | PACK | Freq: Once | INTRAVENOUS | Status: AC | PRN
  Administered 2019-08-21: 32.7 via INTRAVENOUS
  Filled 2019-08-21: qty 33

## 2019-08-21 MED ORDER — REGADENOSON 0.4 MG/5ML IV SOLN
0.4000 mg | Freq: Once | INTRAVENOUS | Status: AC
  Administered 2019-08-21: 0.4 mg via INTRAVENOUS

## 2019-08-22 ENCOUNTER — Ambulatory Visit (HOSPITAL_COMMUNITY): Payer: Medicare HMO | Attending: Cardiovascular Disease

## 2019-08-22 LAB — MYOCARDIAL PERFUSION IMAGING
LV dias vol: 81 mL (ref 46–106)
LV sys vol: 34 mL
Peak HR: 64 {beats}/min
Rest HR: 60 {beats}/min
SDS: 1
SRS: 0
SSS: 1
TID: 1.05

## 2019-08-22 MED ORDER — TECHNETIUM TC 99M TETROFOSMIN IV KIT
31.7000 | PACK | Freq: Once | INTRAVENOUS | Status: AC | PRN
  Administered 2019-08-22: 31.7 via INTRAVENOUS
  Filled 2019-08-22: qty 32

## 2019-08-27 NOTE — Progress Notes (Signed)
[facility] Note  08/28/2019     CHIEF COMPLAINT Patient presents for Retina Follow Up   HISTORY OF PRESENT ILLNESS: Tara Jackson is a 79 y.o. female who presents to the clinic today for:   HPI    Retina Follow Up    Patient presents with  Other.  In left eye.  This started 9 months ago.  Severity is moderate.  I, the attending physician,  performed the HPI with the patient and updated documentation appropriately.          Comments    Patient here for 9 months retina follow up for PED OS. Patient states vision doing the same. Sees double. Wants to find out the reason why has double vision. No eye pain. Has a recent neuclear scan of heart. Has an enlarged heart and has a pacemaker.       Last edited by Bernarda Caffey, MD on 08/28/2019  1:46 PM. (History)    pt states nothing has changed with her double vision, she states she "always" sees double when looking at the TV, she has not been back to see Dr. Herbert Deaner since she started coming here, she wears glasses, but they do not help with the double vision  Referring physician: Monna Fam, MD [place],  [place] [address]  HISTORICAL INFORMATION:   Selected notes from the MEDICAL RECORD NUMBER Referred by Dr. Monna Fam for concern of unusual large drusenoid, epi detachement LEE: 11.11.19 (K. Hecker) [BCVA: OD: 20/25++ OS: 20/20--] Ocular Hx-cataracts OU, glaucoma OU PMH-anemia, anxiety, arthritis, asthma, CKD, CAD, depression, DM(takes Novolin and glipizide), HLD, HTN,stroke    CURRENT MEDICATIONS: No current outpatient medications on file. (Ophthalmic Drugs)   No current facility-administered medications for this visit. (Ophthalmic Drugs)   Current Outpatient Medications (Other)  Medication Sig  . acetaminophen (TYLENOL) 650 MG CR tablet Take 650 mg by mouth every 8 (eight) hours as needed for pain.  Marland Kitchen albuterol (PROVENTIL HFA;VENTOLIN HFA) 108 (90 BASE) MCG/ACT inhaler  Inhale 2 puffs into the lungs every 6 (six) hours as needed for wheezing.  . budesonide (PULMICORT) 0.25 MG/2ML nebulizer solution One vial twice daily (Patient taking differently: Take 0.25 mg by nebulization 2 (two) times daily as needed (shortness of breath). )  . busPIRone (BUSPAR) 10 MG tablet Take 1 tablet (10 mg total) by mouth every morning.  . Cholecalciferol (VITAMIN D-3 PO) Take 2 tablets by mouth every morning.   . clonazePAM (KLONOPIN) 0.5 MG tablet Take 0.5-1 mg by mouth See admin instructions. Takes 1mg  in am and 0.5mg  in pm.  . clopidogrel (PLAVIX) 75 MG tablet Take 75 mg by mouth every morning.  . cyanocobalamin 100 MCG tablet Take 100 mcg by mouth daily.  Marland Kitchen dicyclomine (BENTYL) 10 MG capsule Take 1 capsule (10 mg total) by mouth 3 (three) times daily before meals. (Patient taking differently: Take 10 mg by mouth in the morning and at bedtime. )  . donepezil (ARICEPT) 10 MG tablet TAKE 1 TABLET BY MOUTH EVERYDAY AT BEDTIME (Patient taking differently: Take 10 mg by mouth at bedtime. )  . famotidine (PEPCID) 20 MG tablet TAKE 1 TABLET BY MOUTH EVERY DAY AFTER SUPPER  . formoterol (PERFOROMIST) 20 MCG/2ML nebulizer solution Take 2 mLs (20 mcg total) by nebulization 2 (two) times daily. Use in nebulizer twice daily perfectly regularly  . furosemide (LASIX) 20 MG tablet Take 1 tablet (20 mg total) by mouth daily as needed for fluid or  edema.  . glipiZIDE (GLUCOTROL) 10 MG tablet Take 20 mg by mouth 2 (two) times daily before a meal.   . guaiFENesin (MUCINEX) 600 MG 12 hr tablet Take 600 mg by mouth 2 (two) times daily as needed for cough.  . insulin NPH (HUMULIN N,NOVOLIN N) 100 UNIT/ML injection Inject 0-20 Units into the skin See admin instructions. Novolin-N Give 20 unit in morning; give  5 units at 4pm; evening dose as needed.  If less than 150 blood sugar= 0units.  If greater than 200 blood sugar= give 10units to decrease by 100 blood sugar  . insulin regular (NOVOLIN R,HUMULIN R)  100 units/mL injection Inject 0-25 Units into the skin See admin instructions. Give 25 unit in morning; give 20units at 4pm; evening dose as needed.  If less than 150 blood sugar= 0units.  If greater than 200 blood sugar= give 10units to decrease by 100 blood sugar  . levothyroxine (SYNTHROID) 100 MCG tablet Take 100 mcg by mouth daily before breakfast.  . lidocaine (LIDODERM) 5 % Place 3 patches onto the skin daily as needed (pain). Remove & Discard patch within 12 hours or as directed by MD  . linaclotide (LINZESS) 290 MCG CAPS capsule Take 290 mcg by mouth daily before breakfast.  . LORazepam (ATIVAN) 0.5 MG tablet Take 0.5 mg by mouth daily as needed for anxiety.  . Menthol-Methyl Salicylate (MUSCLE RUB) 10-15 % CREA Apply 1 application topically as needed for muscle pain.  . midodrine (PROAMATINE) 2.5 MG tablet Take 1 tablet (2.5 mg total) by mouth 3 (three) times daily with meals. (Patient taking differently: Take 2.5 mg by mouth as needed. As needed if blood pressure goes to low.)  . nortriptyline (PAMELOR) 50 MG capsule TAKE 2 CAPSULES (100MG ) AT BEDTIME (Patient taking differently: Take 50 mg by mouth 2 (two) times daily. )  . ondansetron (ZOFRAN) 4 MG tablet Take 4 mg by mouth as needed for nausea or vomiting.   . OXYGEN Inhale 2 L into the lungs as needed. Used at overnight with a concentrator   . pantoprazole (PROTONIX) 40 MG tablet Take 1 tablet (40 mg total) by mouth daily.  . polyethylene glycol (MIRALAX / GLYCOLAX) packet Take 17 g by mouth daily as needed for mild constipation.   . potassium chloride SA (K-DUR,KLOR-CON) 20 MEQ tablet Take 1 tablet (20 mEq total) by mouth 2 (two) times daily.  . predniSONE (DELTASONE) 10 MG tablet Take 2 tabs daily until better; then 1 tablet daily x 5 days; then 1/2 tablet daily  . Probiotic Product (ALIGN) 4 MG CAPS Take 1 capsule by mouth daily.  . Pumpkin Seed-Soy Germ (AZO BLADDER CONTROL/GO-LESS PO) Take 1 tablet by mouth daily as needed (bladder  control).  . rosuvastatin (CRESTOR) 20 MG tablet Take 20 mg by mouth daily.   . solifenacin (VESICARE) 5 MG tablet Take 5 mg by mouth daily.  Marland Kitchen terconazole (TERAZOL 7) 0.4 % vaginal cream Place 1 applicator vaginally daily as needed for itching.   No current facility-administered medications for this visit. (Other)      REVIEW OF SYSTEMS: ROS    Positive for: Neurological, Genitourinary, Endocrine, Cardiovascular, Eyes, Psychiatric   Negative for: Constitutional, Gastrointestinal, Skin, Musculoskeletal, HENT, Respiratory, Allergic/Imm, Heme/Lymph   Last edited by Theodore Demark, COA on 08/28/2019  1:19 PM. (History)       ALLERGIES Allergies  Allergen Reactions  . Iodine Other (See Comments)    ONLY IV--Makes unconscious  . Iohexol Other (See Comments)  Code: SOB, Desc: cardiac arrest w/ iv contrast, has never used 13 hr prep//alice calhoun, Onset Date: [id number]   . Metoclopramide Hcl Other (See Comments)    suicidal  . Sulfa Antibiotics Hives  . Lactose Intolerance (Gi) Diarrhea  . Lansoprazole Nausea And Vomiting    PAST MEDICAL HISTORY Past Medical History:  Diagnosis Date  . Anemia   . Anxiety   . Arthritis   . Asthma   . Brittle bone disease   . CKD (chronic kidney disease)   . Complication of anesthesia    hard to wake up after anesthesia, trouble turning head  . Coronary artery disease, non-occlusive 2014   a. minimal CAD by cath in 2014 with 40% RI stenosis. b. low-risk NST in 11/2014.  Marland Kitchen Depression   . Diabetes mellitus type 2, insulin dependent ([place])   . Diabetic neuropathy ([place])   . Diabetic retinopathy ([place])    NPDR OU  . Diastolic dysfunction, left ventricle 11/30/14   EF 55%, grade 1 DD  . DVT (deep venous thrombosis) ([place])    "18 in RLE; 7 LLE prior to PE" (03/24/2015)  . Family history of adverse reaction to anesthesia    " the whole family is hard to wake up"  . Glaucoma   . H/O hiatal hernia   . Hyperlipidemia   . Hypertension  associated with diabetes ([place])   . Hypertensive retinopathy    OU  . Hypothyroidism   . Kidney stones    "passed them"  . Macular degeneration    Exu OU  . Memory loss 06/03/2014  . On home oxygen therapy    "2L oxygen concentrator @ night"  . Osteoporosis   . Oxygen desaturation during sleep    wears 2 liters of oxygen at night   . Palpitations 35years ago   Cardionet monitor - revealed mostly normal sinus rhythm, sinus bradycardia with first-degree A-V block heart rates mostly in the 50s and 60s with some 70s. No arrhythmias, PVCs or PACs noted.  . PE (pulmonary thromboembolism) ([place]) x 3, last one was ~ 2003   history of recurrent RLE DVT with PE - last PE ~>13 yrs; Maintained on Plavix  . Pneumonia   . Presence of permanent cardiac pacemaker   . Restless legs syndrome   . Sleep apnea    cpap disontinued; test done 07/14/2008 ordered per  [place]rum  . Spinal headache   . Spinal stenosis    L 3, L 4 and L 5, and C 1, C 2 and C3  . Stroke [facility]) 04-19-2013   tia and stroke. Weakness Rt hand  . 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 s/p [employer] PPM in 03/2015 by Dr. Lovena Le secondary to sinus node dysfunction.   Marland Kitchen TIA (transient ischemic attack) March 2014   Past Surgical History:  Procedure Laterality Date  . ABDOMINAL HYSTERECTOMY    . APPENDECTOMY    . BACK SURGERY     steroid inj  . CATARACT EXTRACTION Bilateral   . CATARACT EXTRACTION W/ INTRAOCULAR LENS  IMPLANT, BILATERAL Bilateral 2000s  . CHOLECYSTECTOMY    . EP IMPLANTABLE DEVICE N/A 03/24/2015   [facility] [employer] PPM, Dr. Lovena Le  . ESOPHAGOGASTRODUODENOSCOPY  08/09/2011   Procedure: ESOPHAGOGASTRODUODENOSCOPY (EGD);  Surgeon: Jeryl Columbia, MD;  Location: [facility];  Service: Endoscopy;  Laterality: N/A;  . ESOPHAGOGASTRODUODENOSCOPY (EGD) WITH PROPOFOL N/A 11/12/2013   Procedure: ESOPHAGOGASTRODUODENOSCOPY (EGD) WITH PROPOFOL;  Surgeon: Jeryl Columbia, MD;  Location: [facility] ENDOSCOPY;  Service: Endoscopy;  Laterality: N/A;  . ESOPHAGOGASTRODUODENOSCOPY (EGD) WITH PROPOFOL N/A 05/05/2016   Procedure: ESOPHAGOGASTRODUODENOSCOPY (EGD) WITH PROPOFOL;  Surgeon: Clarene Essex, MD;  Location[facility] ENDOSCOPY;  Service: Endoscopy;  Laterality: N/A;  . EYE SURGERY Bilateral    Cat Sx OU  . GLAUCOMA SURGERY Bilateral 2000s   "laser"  . HOT HEMOSTASIS  08/09/2011   Procedure: HOT HEMOSTASIS (ARGON PLASMA COAGULATION/BICAP);  Surgeon: Jeryl Columbia, MD;  Location: [facility];  Service: Endoscopy;  Laterality: N/A;  . HOT HEMOSTASIS N/A 11/12/2013   Procedure: HOT HEMOSTASIS (ARGON PLASMA COAGULATION/BICAP);  Surgeon: Jeryl Columbia, MD;  Location: [facility];  Service: Endoscopy;  Laterality: N/A;  . INSERT / REPLACE / REMOVE PACEMAKER  03/24/2015  . IRIDOTOMY / IRIDECTOMY Bilateral   . JOINT REPLACEMENT    . KNEE SURGERY Left done with 2nd left knee replacement   spacer bar placement  . LEFT HEART CATHETERIZATION WITH CORONARY ANGIOGRAM N/A 02/14/2012   Procedure: LEFT HEART CATHETERIZATION WITH CORONARY ANGIOGRAM;  Surgeon: Leonie Man, MD;  Location: [facility] CATH LAB;  Service: Cardiovascular:: no evidence of obstructive coronary disease ,low EDP with normal EF  . LEV DOPPLER  05/29/2012   RIGHT EXTREM. NORMAL VENOUS DUPLEX  . NM MYOCAR PERF WALL MOTION  01/26/2012; 11/2014   a. EF 79%,LV normal,ST SEGMENT CHANGE SUGGESTIVE OF ISCHEMIA.; LOW RISK, EF 60%. No ischemia or infarction. No wall motion abnormality   . PACEMAKER PLACEMENT    . REVISION TOTAL KNEE ARTHROPLASTY Left   . TONSILLECTOMY    . TOTAL KNEE ARTHROPLASTY Bilateral   . TRANSTHORACIC ECHOCARDIOGRAM  11/2017   11/2017: EF 60-65%. Gr1 DD. PAP ~33 mmHg.  Mild TR. PPM leads in RA & RV.   Marland Kitchen TRANSTHORACIC ECHOCARDIOGRAM  2014; October 2016   a. EF 75-64%; normal diastolic pressures, no regional wall motion motion abnormalities;; b/ F 55-60%. Normal wall motion. GR 1 DD. No valve lesions  . TRIGGER FINGER RELEASE  11/22/2011   Procedure: RELEASE  TRIGGER FINGER/A-1 PULLEY;  Surgeon: Meredith Pel, MD;  Location: [facility];  Service: Orthopedics;  Laterality: Left;  Left trigger thumb release  . TRIGGER FINGER RELEASE Right yrs ago    FAMILY HISTORY Family History  Problem Relation Age of Onset  . Cancer Mother   . Diabetes Mother   . Cancer - Prostate Father   . Diabetes Father   . Retinal detachment Son   . Diabetes Sister   . Diabetes Brother   . Diabetes Paternal Grandmother     SOCIAL HISTORY Social History   Tobacco Use  . Smoking status: Never Smoker  . Smokeless tobacco: Never Used  Vaping Use  . Vaping Use: Never used  Substance Use Topics  . Alcohol use: No    Alcohol/week: 0.0 standard drinks  . Drug use: No         OPHTHALMIC EXAM:  Base Eye Exam    Visual Acuity (Snellen - Linear)      Right Left   Dist [place] 20/30 -1 20/40   Dist ph  20/25 -2 NI       Tonometry (Tonopen, 1:15 PM)      Right Left   Pressure 13 12       Pupils      Dark Light Shape React APD   Right 3 2 Round Brisk None   Left 3 2 Round Brisk None       Visual Fields (Counting fingers)      Left Right  Full        Extraocular Movement      Right Left    Full, Ortho Full, Ortho  Mild XT       Neuro/Psych    Oriented x3: Yes   Mood/Affect: Normal       Dilation    Both eyes: 1.0% Mydriacyl, 2.5% Phenylephrine @ 1:14 PM        Slit Lamp and Fundus Exam    Slit Lamp Exam      Right Left   Lids/Lashes Dermatochalasis - upper lid, Meibomian gland dysfunction Dermatochalasis - lower lid, Meibomian gland dysfunction   Conjunctiva/Sclera mild melanosis White and quiet   Cornea arcus, 1+ PEE, no edema arcus, well healed temporal cataract wound, 1+PEE   Anterior Chamber Deep and quiet Deep and quiet   Iris round, no NVI, poorly dilated Round, mild atrophy inferotemporal, poorly dilated   Lens PCIOL PC IOL   Vitreous Vitreous syneresis Vitreous syneresis       Fundus Exam      Right Left   Disc pink  and sharp, central cupping, Thin inferior rim pink and sharp, central Pallor, +cupping   C/D Ratio 0.7 0.6   Macula good foveal reflex, RPE motltling and clumping, few MA, PED, RPE atrophy, no heme large PED nasal macula -- shrinking, blunted foveal reflex, scattered MA, RPE mottling   Vessels  Vascular attenuation, mild tortuousity Vascular attenuation, mildly tortuous.   Periphery attached attached          IMAGING AND PROCEDURES  Imaging and Procedures for @TODAY @  OCT, Retina - OU - Both Eyes       Right Eye Quality was good. Central Foveal Thickness: 206. Progression has been stable. Findings include normal foveal contour, no IRF, no SRF, pigment epithelial detachment, epiretinal membrane, macular pucker, retinal drusen , outer retinal atrophy, vitreomacular adhesion  (stable).   Left Eye Quality was good. Central Foveal Thickness: 213. Progression has improved. Findings include pigment epithelial detachment, normal foveal contour, no IRF, no SRF, retinal drusen , vitreomacular adhesion  (Interval decrease in nasal PED height ).   Notes **Images stored on drive**  Diagnosis / Impression:  OD: NFP, no IRF/SRF, +focal ORA and PED, +ERM with mild pucker--stable OS: Interval decrease in nasal PED height   Clinical management:  See below  Abbreviations: NFP - Normal foveal profile. CME - cystoid macular edema. PED - pigment epithelial detachment. IRF - intraretinal fluid. SRF - subretinal fluid. EZ - ellipsoid zone. ERM - epiretinal membrane. ORA - outer retinal atrophy. ORT - outer retinal tubulation. SRHM - subretinal hyper-reflective material        Fluorescein Angiography Optos (Transit OS)       Right Eye   Progression has been stable. Early phase findings include staining (Very low fluorescein signal). Mid/Late phase findings include staining (Very low fluorescein signal; inferior patch of hyperfluorescent staining).   Left Eye   Progression has been stable.  Early phase findings include staining (Very low fluorescein signal). Mid/Late phase findings include staining (Very low fluorescein signal, mild peripapillary staining nasal macula).   Notes Images stored on drive;   Impression: Very fluorescein signal OU OD: inferior patch of hyperfluorescence staining OS: focal patch of hyperflourescent staining nasal macula corresponding to PED                ASSESSMENT/PLAN:    ICD-10-CM   1. 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 Retinal edema  H35.81 OCT, Retina - OU - Both Eyes  4. Mild nonproliferative diabetic retinopathy of both eyes without macular edema associated with type 2 diabetes mellitus ([place])  Z99.3570   5. Essential hypertension  I10   6. Hypertensive retinopathy of both eyes  H35.033 Fluorescein Angiography Optos (Transit OS)  7. Pseudophakia of both eyes  Z96.1   8. Fuchs' endothelial dystrophy  H18.519   9. Diplopia  H53.2     1,2. Exudative ARMD w/ PED OU (OS > OD)  - OCT shows scattered PED OU, OS with interval decrease in PED height nasal macula, and still no significant IRF/SRF overlying  - FA shows staining/mild late leakage -- ?inactive CNVM  - possible polypoidal choroidal vaculopathy  - unable to do ICG angiography due to severe allergy to iodine -- pt coded with IV contrast for CT scan  - BCVA  20/25-1 OD, 20/40 OS-stable from prior  - OCT without IRF or SRF OU  - recommend monitoring for now -- pt in agreement  - f/u in 6 months, sooner prn -- DFE / OCT  3,4. Mild nonproliferative diabetic retinopathy w/o DME, both eyes  - exam with scattered MA, IRH OU  - FA shows mild MA; no NV  - OCT without macular edema, both eyes  5,6. Hypertensive retinopathy OU  - discussed importance of tight BP control  - monitor  7. Pseudophakia OU  - s/p CE/IOL  - beautiful  surgeries, doing well  - monitor  8. Fuch's endothelial dystrophy  - mild guttata OU, no edema  9. Horizontal binocular diplopia  - mild XT noted on exam  - may benefit from prism  - recommend evaluation with Dr. Herbert Deaner   Ophthalmic Meds Ordered this visit:  No orders of the defined types were placed in this encounter.      Return in about 6 months (around 02/28/2020) for f/u 6 months, exu ARMD OU, DFE, OCT.  There are no Patient Instructions on file for this visit.   Explained the diagnoses, plan, and follow up with the patient and they expressed understanding.  Patient expressed understanding of the importance of proper follow up care.   This document serves as a record of services personally performed by Gardiner Sleeper, MD, PhD. It was created on their behalf by Roselee Nova, COMT. The creation of this record is the provider's dictation and/or activities during the visit.  Electronically signed by: Roselee Nova, COMT 08/29/19 1:54 PM   This document serves as a record of services personally performed by Gardiner Sleeper, MD, PhD. It was created on their behalf by San Jetty. Owens Shark, OA an ophthalmic technician. The creation of this record is the provider's dictation and/or activities during the visit.    Electronically signed by: San Jetty. Owens Shark, [place] 07.21.2021 1:54 PM  Gardiner Sleeper, M.D., Ph.D. Diseases & Surgery of the Retina and Vitreous [facility]  I have reviewed the above documentation for accuracy and completeness, and I agree with the above. Gardiner Sleeper, M.D., Ph.D. 08/29/19 1:54 PM   Abbreviations: M myopia (nearsighted); A astigmatism; H hyperopia (farsighted); P presbyopia; Mrx spectacle prescription;  CTL contact lenses; OD right eye; OS left eye; OU both eyes  XT exotropia; ET esotropia; PEK punctate epithelial keratitis; PEE punctate epithelial erosions; DES dry eye syndrome; MGD meibomian gland dysfunction; ATs artificial tears;  PFAT's preservative free artificial  tears; [place] nuclear sclerotic cataract; PSC posterior subcapsular cataract; ERM epi-retinal membrane; PVD posterior vitreous detachment; RD retinal detachment; DM diabetes mellitus; DR diabetic retinopathy; NPDR non-proliferative diabetic retinopathy; PDR proliferative diabetic retinopathy; CSME clinically significant macular edema; DME diabetic macular edema; dbh dot blot hemorrhages; CWS cotton wool spot; POAG primary open angle glaucoma; C/D cup-to-disc ratio; HVF humphrey visual field; GVF goldmann visual field; OCT optical coherence tomography; IOP intraocular pressure; BRVO Branch retinal vein occlusion; CRVO central retinal vein occlusion; CRAO central retinal artery occlusion; BRAO branch retinal artery occlusion; RT retinal tear; SB scleral buckle; PPV pars plana vitrectomy; VH Vitreous hemorrhage; PRP panretinal laser photocoagulation; IVK intravitreal kenalog; VMT vitreomacular traction; MH Macular hole;  NVD neovascularization of the disc; NVE neovascularization elsewhere; AREDS age related eye disease study; ARMD age related macular degeneration; POAG primary open angle glaucoma; EBMD epithelial/anterior basement membrane dystrophy; ACIOL anterior chamber intraocular lens; IOL intraocular lens; PCIOL posterior chamber intraocular lens; Phaco/IOL phacoemulsification with intraocular lens placement; [place] photorefractive keratectomy; LASIK laser assisted in situ keratomileusis; HTN hypertension; DM diabetes mellitus; COPD chronic obstructive pulmonary disease

## 2019-08-28 ENCOUNTER — Ambulatory Visit (INDEPENDENT_AMBULATORY_CARE_PROVIDER_SITE_OTHER): Payer: Medicare HMO | Admitting: Ophthalmology

## 2019-08-28 ENCOUNTER — Other Ambulatory Visit: Payer: Self-pay

## 2019-08-28 ENCOUNTER — Encounter (INDEPENDENT_AMBULATORY_CARE_PROVIDER_SITE_OTHER): Payer: Self-pay | Admitting: Ophthalmology

## 2019-08-28 DIAGNOSIS — E113293 Type 2 diabetes mellitus with mild nonproliferative diabetic retinopathy without macular edema, bilateral: Secondary | ICD-10-CM | POA: Diagnosis not present

## 2019-08-28 DIAGNOSIS — H35033 Hypertensive retinopathy, bilateral: Secondary | ICD-10-CM | POA: Diagnosis not present

## 2019-08-28 DIAGNOSIS — Z961 Presence of intraocular lens: Secondary | ICD-10-CM

## 2019-08-28 DIAGNOSIS — H35723 Serous detachment of retinal pigment epithelium, bilateral: Secondary | ICD-10-CM | POA: Diagnosis not present

## 2019-08-28 DIAGNOSIS — H353232 Exudative age-related macular degeneration, bilateral, with inactive choroidal neovascularization: Secondary | ICD-10-CM | POA: Diagnosis not present

## 2019-08-28 DIAGNOSIS — H532 Diplopia: Secondary | ICD-10-CM

## 2019-08-28 DIAGNOSIS — H18519 Endothelial corneal dystrophy, unspecified eye: Secondary | ICD-10-CM

## 2019-08-28 DIAGNOSIS — H3581 Retinal edema: Secondary | ICD-10-CM

## 2019-08-28 DIAGNOSIS — I10 Essential (primary) hypertension: Secondary | ICD-10-CM

## 2019-09-19 ENCOUNTER — Ambulatory Visit (INDEPENDENT_AMBULATORY_CARE_PROVIDER_SITE_OTHER): Payer: Medicare HMO | Admitting: *Deleted

## 2019-09-19 DIAGNOSIS — Z950 Presence of cardiac pacemaker: Secondary | ICD-10-CM | POA: Diagnosis not present

## 2019-09-20 ENCOUNTER — Other Ambulatory Visit: Payer: Self-pay | Admitting: Neurology

## 2019-09-20 LAB — CUP PACEART REMOTE DEVICE CHECK
Battery Remaining Longevity: 114 mo
Battery Remaining Percentage: 100 %
Brady Statistic RA Percent Paced: 82 %
Brady Statistic RV Percent Paced: 64 %
Date Time Interrogation Session: 20210812030100
Implantable Lead Implant Date: 20170214
Implantable Lead Implant Date: 20170214
Implantable Lead Location: 753859
Implantable Lead Location: 753860
Implantable Lead Model: 7740
Implantable Lead Model: 7741
Implantable Lead Serial Number: 649859
Implantable Lead Serial Number: 728741
Implantable Pulse Generator Implant Date: 20170214
Lead Channel Impedance Value: 669 Ohm
Lead Channel Impedance Value: 757 Ohm
Lead Channel Pacing Threshold Amplitude: 0.5 V
Lead Channel Pacing Threshold Amplitude: 1.3 V
Lead Channel Pacing Threshold Pulse Width: 0.4 ms
Lead Channel Pacing Threshold Pulse Width: 0.4 ms
Lead Channel Setting Pacing Amplitude: 1.7 V
Lead Channel Setting Pacing Amplitude: 2 V
Lead Channel Setting Pacing Pulse Width: 0.4 ms
Lead Channel Setting Sensing Sensitivity: 2.5 mV
Pulse Gen Serial Number: 718098

## 2019-09-23 NOTE — Progress Notes (Signed)
Remote pacemaker transmission.   

## 2019-09-24 ENCOUNTER — Telehealth: Payer: Self-pay | Admitting: Neurology

## 2019-09-24 NOTE — Telephone Encounter (Signed)
Pt's husband called stating that the pharmacy called in for a refill for the pt's donepezil (ARICEPT) 10 MG tablet and he is wanting to know the update on this. Please advise.

## 2019-09-24 NOTE — Telephone Encounter (Signed)
LVM for husband advising him the patient needs a follow up. Advised to call and schedule FU, and we will be glad to refill Aricept.

## 2019-09-25 ENCOUNTER — Other Ambulatory Visit: Payer: Self-pay | Admitting: Medical

## 2019-09-25 ENCOUNTER — Other Ambulatory Visit: Payer: Self-pay | Admitting: *Deleted

## 2019-09-25 MED ORDER — DONEPEZIL HCL 10 MG PO TABS: ORAL_TABLET | ORAL | 0 refills | Status: DC

## 2019-09-25 NOTE — Telephone Encounter (Signed)
Pt's husband called and scheduled appt for pt on 11/16. MD nor NP had anything sooner. Pt is needing her Aricept filled till her appt date. Please advise.

## 2019-09-25 NOTE — Telephone Encounter (Signed)
E-scribed refill. Pt has appt 12/24/19

## 2019-09-25 NOTE — Telephone Encounter (Signed)
Rx has been sent to the pharmacy electronically. ° °

## 2019-09-25 NOTE — Telephone Encounter (Signed)
This is Dr. Harding's pt. °

## 2019-10-10 ENCOUNTER — Other Ambulatory Visit: Payer: Self-pay | Admitting: Neurology

## 2019-10-11 ENCOUNTER — Other Ambulatory Visit: Payer: Self-pay | Admitting: Internal Medicine

## 2019-10-21 ENCOUNTER — Other Ambulatory Visit: Payer: Self-pay | Admitting: Internal Medicine

## 2019-10-31 ENCOUNTER — Encounter: Payer: Self-pay | Admitting: Primary Care

## 2019-10-31 ENCOUNTER — Other Ambulatory Visit: Payer: Self-pay

## 2019-10-31 ENCOUNTER — Ambulatory Visit: Payer: Medicare HMO | Admitting: Primary Care

## 2019-10-31 VITALS — BP 128/70 | HR 64 | Temp 97.2°F | Ht 67.5 in | Wt 261.2 lb

## 2019-10-31 DIAGNOSIS — J9611 Chronic respiratory failure with hypoxia: Secondary | ICD-10-CM | POA: Diagnosis not present

## 2019-10-31 DIAGNOSIS — J45991 Cough variant asthma: Secondary | ICD-10-CM | POA: Diagnosis not present

## 2019-10-31 NOTE — Progress Notes (Signed)
@Patient  ID: Tara Jackson, female    DOB: [birth year], 79 y.o.   MRN: [id number]  Chief Complaint  Patient presents with  . Follow-up    Referring provider: Shon Baton, MD  HPI: 79 year old female, never smoked.  Past medical history significant for cough variant asthma, chronic respiratory failure with hypoxia, sleep apnea, allergic rhinitis, chronic diastolic heart failure, hypertension, syncope, chronic kidney disease stage III, diabetes mellitus type 2.  Patient of Dr. Melvyn Novas.   Previous [facility] pulmonary encounters: 07/31/2019  Patient presents today for regular follow-up. She is doing ok, states that her cough is some better. She is still experiencing post nasal drip symptoms during the day. Her cough is worse at night.  She is having some memory issues, recently started Aricept. She takes clonazepam 0.5mg  twice a day and has only used ativan once this past year. She is requesting a refill of medication, they were prescribed by Dr. Virgina Jock and she will contact his office for refill.   10/31/2019-interim history Patient presents today for 51-month follow-up cough variant asthma/sleep apnea/chronic respiratory failure.  During last visit patient reported that her cough was some better but continues to be significant at night.  Patient was recommended to take prednisone as instructed; 2 tablets daily until better then decrease to 1 tablet every 5 days then decrease to half tablet daily.  Advised her to take as needed Lasix 1-2 times over that particular week due to swelling/edema.  BNP was normal.  She is feeling better. She uses her Albuterol rescue inhaler twice a week.  Currently on 1/2 tab prednisone daily and planning on tapering off. Her blood glucose levels have been elevated. Breathing is more limited by her walking d/t legs. She is following with neurology for memory difficulties. Husbands goal is for her to come off some of her medications.   Imaging : 07/19/19 CXR: Enlargement of cardiac  silhouette with pulmonary vascular congestion post pacemaker.Bronchitic changes without infiltrate.  Allergies  Allergen Reactions  . Iodine Other (See Comments)    ONLY IV--Makes unconscious  . Iohexol Other (See Comments)     Code: SOB, Desc: cardiac arrest w/ iv contrast, has never used 13 hr prep//alice calhoun, Onset Date: [id number]   . Metoclopramide Hcl Other (See Comments)    suicidal  . Sulfa Antibiotics Hives  . Lactose Intolerance (Gi) Diarrhea  . Lansoprazole Nausea And Vomiting    Immunization History  Administered Date(s) Administered  . Influenza Whole 10/22/2009  . Influenza, High Dose Seasonal PF 10/24/2016, 11/07/2017, 10/22/2019  . Influenza-Unspecified 12/09/2014, 11/08/2018  . PFIZER SARS-COV-2 Vaccination 03/12/2019, 04/02/2019  . Pneumococcal Polysaccharide-23 02/07/2005    Past Medical History:  Diagnosis Date  . Anemia   . Anxiety   . Arthritis   . Asthma   . Brittle bone disease   . CKD (chronic kidney disease)   . Complication of anesthesia    hard to wake up after anesthesia, trouble turning head  . Coronary artery disease, non-occlusive 2014   a. minimal CAD by cath in 2014 with 40% RI stenosis. b. low-risk NST in 11/2014.  Marland Kitchen Depression   . Diabetes mellitus type 2, insulin dependent ([place])   . Diabetic neuropathy ([place])   . Diabetic retinopathy ([place])    NPDR OU  . Diastolic dysfunction, left ventricle 11/30/14   EF 55%, grade 1 DD  . DVT (deep venous thrombosis) ([place])    "18 in RLE; 7 LLE prior to PE" (03/24/2015)  . Family history of adverse reaction  to anesthesia    " the whole family is hard to wake up"  . Glaucoma   . H/O hiatal hernia   . Hyperlipidemia   . Hypertension associated with diabetes ([place])   . Hypertensive retinopathy    OU  . Hypothyroidism   . Kidney stones    "passed them"  . Macular degeneration    Exu OU  . Memory loss 06/03/2014  . On home oxygen therapy    "2L oxygen concentrator @ night"  . Osteoporosis    . Oxygen desaturation during sleep    wears 2 liters of oxygen at night   . Palpitations 35years ago   Cardionet monitor - revealed mostly normal sinus rhythm, sinus bradycardia with first-degree A-V block heart rates mostly in the 50s and 60s with some 70s. No arrhythmias, PVCs or PACs noted.  . PE (pulmonary thromboembolism) ([place]) x 3, last one was ~ 2003   history of recurrent RLE DVT with PE - last PE ~>13 yrs; Maintained on Plavix  . Pneumonia   . Presence of permanent cardiac pacemaker   . Restless legs syndrome   . Sleep apnea    cpap disontinued; test done 07/14/2008 ordered per  [place]  . Spinal headache   . Spinal stenosis    L 3, L 4 and L 5, and C 1, C 2 and C3  . Stroke [facility]) 04-19-2013   tia and stroke. Weakness Rt hand  . 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 s/p [employer] PPM in 03/2015 by Dr. Lovena Le secondary to sinus node dysfunction.   Marland Kitchen TIA (transient ischemic attack) March 2014    Tobacco History: Social History   Tobacco Use  Smoking Status Passive Smoke Exposure - Never Smoker  Smokeless Tobacco Never Used   Counseling given: Not Answered   Outpatient Medications Prior to Visit  Medication Sig Dispense Refill  . acetaminophen (TYLENOL) 650 MG CR tablet Take 650 mg by mouth every 8 (eight) hours as needed for pain.    Marland Kitchen albuterol (PROVENTIL HFA;VENTOLIN HFA) 108 (90 BASE) MCG/ACT inhaler Inhale 2 puffs into the lungs every 6 (six) hours as needed for wheezing.    . budesonide-formoterol (SYMBICORT) 80-4.5 MCG/ACT inhaler Inhale 2 puffs into the lungs 2 (two) times daily.    . busPIRone (BUSPAR) 10 MG tablet Take 1 tablet (10 mg total) by mouth every morning. 60 tablet 1  . Cholecalciferol (VITAMIN D-3 PO) Take 2 tablets by mouth every morning.     . clonazePAM (KLONOPIN) 0.5 MG tablet Take 0.5-1 mg by mouth See admin instructions. Takes 1mg  in am and 0.5mg  in pm.    . clopidogrel (PLAVIX) 75 MG tablet Take 75 mg by mouth every morning.    .  cyanocobalamin 100 MCG tablet Take 100 mcg by mouth daily.    Marland Kitchen dicyclomine (BENTYL) 10 MG capsule Take 1 capsule (10 mg total) by mouth 3 (three) times daily before meals. (Patient taking differently: Take 10 mg by mouth in the morning and at bedtime. )    . donepezil (ARICEPT) 10 MG tablet TAKE 1 TABLET BY MOUTH EVERYDAY AT BEDTIME 90 tablet 0  . famotidine (PEPCID) 20 MG tablet TAKE 1 TABLET BY MOUTH EVERY DAY AFTER SUPPER 90 tablet 3  . furosemide (LASIX) 20 MG tablet TAKE 1 TABLET (20 MG TOTAL) BY MOUTH DAILY AS NEEDED FOR FLUID OR EDEMA. 90 tablet 1  . glipiZIDE (GLUCOTROL) 10 MG tablet Take 20 mg by mouth 2 (two) times daily before  a meal.     . guaiFENesin (MUCINEX) 600 MG 12 hr tablet Take 600 mg by mouth 2 (two) times daily as needed for cough.    . insulin NPH (HUMULIN N,NOVOLIN N) 100 UNIT/ML injection Inject 0-20 Units into the skin See admin instructions. Novolin-N Give 20 unit in morning; give  5 units at 4pm; evening dose as needed.  If less than 150 blood sugar= 0units.  If greater than 200 blood sugar= give 10units to decrease by 100 blood sugar    . insulin regular (NOVOLIN R,HUMULIN R) 100 units/mL injection Inject 0-25 Units into the skin See admin instructions. Give 25 unit in morning; give 20units at 4pm; evening dose as needed.  If less than 150 blood sugar= 0units.  If greater than 200 blood sugar= give 10units to decrease by 100 blood sugar    . levothyroxine (SYNTHROID) 100 MCG tablet Take 100 mcg by mouth daily before breakfast.    . lidocaine (LIDODERM) 5 % Place 3 patches onto the skin daily as needed (pain). Remove & Discard patch within 12 hours or as directed by MD    . linaclotide (LINZESS) 290 MCG CAPS capsule Take 290 mcg by mouth daily before breakfast.    . LORazepam (ATIVAN) 0.5 MG tablet Take 0.5 mg by mouth daily as needed for anxiety.    . Menthol-Methyl Salicylate (MUSCLE RUB) 10-15 % CREA Apply 1 application topically as needed for muscle pain.    .  midodrine (PROAMATINE) 2.5 MG tablet Take 1 tablet (2.5 mg total) by mouth 3 (three) times daily with meals. (Patient taking differently: Take 2.5 mg by mouth as needed. As needed if blood pressure goes to low.) 90 tablet 6  . nortriptyline (PAMELOR) 50 MG capsule TAKE 2 CAPSULES (100MG ) AT BEDTIME (Patient taking differently: Take 50 mg by mouth 2 (two) times daily. ) 180 capsule 1  . ondansetron (ZOFRAN) 4 MG tablet Take 4 mg by mouth as needed for nausea or vomiting.   0  . OXYGEN Inhale 2 L into the lungs as needed. Used at overnight with a concentrator     . pantoprazole (PROTONIX) 40 MG tablet Take 1 tablet (40 mg total) by mouth daily. 90 tablet 3  . polyethylene glycol (MIRALAX / GLYCOLAX) packet Take 17 g by mouth daily as needed for mild constipation.     . potassium chloride SA (K-DUR,KLOR-CON) 20 MEQ tablet Take 1 tablet (20 mEq total) by mouth 2 (two) times daily. 10 tablet 0  . Probiotic Product (ALIGN) 4 MG CAPS Take 1 capsule by mouth daily.    . Pumpkin Seed-Soy Germ (AZO BLADDER CONTROL/GO-LESS PO) Take 1 tablet by mouth daily as needed (bladder control).    . rosuvastatin (CRESTOR) 20 MG tablet Take 20 mg by mouth daily.     . solifenacin (VESICARE) 5 MG tablet Take 5 mg by mouth daily.    Marland Kitchen terconazole (TERAZOL 7) 0.4 % vaginal cream Place 1 applicator vaginally daily as needed for itching.    . budesonide (PULMICORT) 0.25 MG/2ML nebulizer solution USE 1 VIAL  IN  NEBULIZER TWICE  DAILY - Rinse mouth after each use 60 mL 0  . PERFOROMIST 20 MCG/2ML nebulizer solution USE 1 VIAL  IN  NEBULIZER TWICE  DAILY - Morning and evening 60 mL 0  . predniSONE (DELTASONE) 10 MG tablet TAKE 2 TABS EVERY MORNING UNTIL 100% BETTER THEN TAKE 1 TAB EVERY DAY FOR 1 WEEK THEN 1/2 TAB DAILY 100 tablet 1  No facility-administered medications prior to visit.    Review of Systems  Review of Systems  Constitutional: Negative.   Respiratory: Negative for apnea, cough, shortness of breath and  wheezing.     Physical Exam  BP 128/70 (BP Location: Left Wrist, Cuff Size: Normal)   Pulse 64   Temp (!) 97.2 F (36.2 C) (Temporal)   Ht 5' 7.5" (1.715 m)   Wt 261 lb 3.2 oz (118.5 kg)   SpO2 99%   BMI 40.31 kg/m  Physical Exam Constitutional:      Appearance: Normal appearance. She is obese.  HENT:     Mouth/Throat:     Comments: Deferred due to masking Cardiovascular:     Rate and Rhythm: Normal rate and regular rhythm.  Pulmonary:     Effort: Pulmonary effort is normal.     Breath sounds: Normal breath sounds.  Musculoskeletal:     Comments: In wheelchair  Neurological:     General: No focal deficit present.     Mental Status: She is alert and oriented to person, place, and time. Mental status is at baseline.  Psychiatric:        Mood and Affect: Mood normal.        Behavior: Behavior normal.        Thought Content: Thought content normal.        Judgment: Judgment normal.      Lab Results:  CBC    Component Value Date/Time   WBC 6.6 07/19/2019 0929   RBC 3.82 (L) 07/19/2019 0929   HGB 12.3 07/19/2019 0929   HCT 38.3 07/19/2019 0929   PLT 237 07/19/2019 0929   MCV 100.3 (H) 07/19/2019 0929   MCH 32.2 07/19/2019 0929   MCHC 32.1 07/19/2019 0929   RDW 11.9 07/19/2019 0929   LYMPHSABS 1.2 03/19/2019 1311   MONOABS 0.2 03/19/2019 1311   EOSABS 0.1 03/19/2019 1311   BASOSABS 0.1 03/19/2019 1311    BMET    Component Value Date/Time   NA 136 07/31/2019 1226   K 4.0 07/31/2019 1226   CL 101 07/31/2019 1226   CO2 29 07/31/2019 1226   GLUCOSE 249 (H) 07/31/2019 1226   BUN 19 07/31/2019 1226   CREATININE 1.42 (H) 07/31/2019 1226   CREATININE 1.64 (H) 04/09/2015 1438   CALCIUM 9.1 07/31/2019 1226   GFRNONAA 27 (L) 07/19/2019 0929   GFRAA 32 (L) 07/19/2019 0929    BNP    Component Value Date/Time   BNP 28.1 03/22/2019 1502    ProBNP    Component Value Date/Time   PROBNP 30.0 07/31/2019 1226    Imaging: No results found.   Assessment &  Plan:   Cough variant asthma vs uacs  - Cough has improved; uses SABA twice a week - Continue Symbicort 80mcg 1 puff twice daily (rinse mouth after use) and PPI daily - Continue prednisone 5mg  x 5 days; then take prednisone 5mg  every other day x 5 doses; then stop  - FU in 3 months with Dr. Melvyn Novas (please bring all medications in hand to next visit)     Martyn Ehrich, NP 11/04/2019

## 2019-10-31 NOTE — Patient Instructions (Addendum)
Recommendations: - Stop nebulizer's (ok to have supplies renewed for back up) - Continue Symbicort 1 puff twice daily (rinse mouth after use) - Continue prednisone 5mg  (half tab) x 5 days; then take prednisone 5mg  (half tab) every other day x 5 doses; then stop  - Continue Protonix 40mg  at bedtime - You can get over the counter medication called chlorphenirmine 4mg  tablet every 4-6 hours for cough (chlor-a-tabs) - Take Furosemide (lasix) as needed for leg swelling   Orders: - Needs nebulizer supplies   Follow-up: - 3 MONTHS (please bring all medications in hand to next visit)

## 2019-11-01 ENCOUNTER — Telehealth: Payer: Self-pay | Admitting: Primary Care

## 2019-11-01 ENCOUNTER — Encounter: Payer: Self-pay | Admitting: Primary Care

## 2019-11-01 NOTE — Telephone Encounter (Signed)
Response from pt's DME, [facility]:  Tara Jackson sent to Tara Jackson Patient is currently billing for a nebulizer.

## 2019-11-01 NOTE — Telephone Encounter (Signed)
Tara Jackson sent to Tara Jackson I spoke with patient on yesterday. The patient is just needing neb kits and this went out via [employer] on today.

## 2019-11-01 NOTE — Telephone Encounter (Signed)
Forwarding back to [facility] for further clarification

## 2019-11-02 ENCOUNTER — Other Ambulatory Visit: Payer: Self-pay | Admitting: Neurology

## 2019-11-04 NOTE — Assessment & Plan Note (Addendum)
-   Cough has improved; uses SABA twice a week - Continue Symbicort 64mcg 1 puff twice daily (rinse mouth after use) and PPI daily - Continue prednisone 5mg  x 5 days; then take prednisone 5mg  every other day x 5 doses; then stop  - FU in 3 months with Dr. Melvyn Novas (please bring all medications in hand to next visit)

## 2019-12-12 ENCOUNTER — Other Ambulatory Visit: Payer: Self-pay | Admitting: Internal Medicine

## 2019-12-19 ENCOUNTER — Ambulatory Visit (INDEPENDENT_AMBULATORY_CARE_PROVIDER_SITE_OTHER): Payer: Medicare HMO

## 2019-12-19 DIAGNOSIS — I495 Sick sinus syndrome: Secondary | ICD-10-CM | POA: Diagnosis not present

## 2019-12-19 LAB — CUP PACEART REMOTE DEVICE CHECK
Battery Remaining Longevity: 108 mo
Battery Remaining Percentage: 100 %
Brady Statistic RA Percent Paced: 84 %
Brady Statistic RV Percent Paced: 66 %
Date Time Interrogation Session: 20211111042100
Implantable Lead Implant Date: 20170214
Implantable Lead Implant Date: 20170214
Implantable Lead Location: 753859
Implantable Lead Location: 753860
Implantable Lead Model: 7740
Implantable Lead Model: 7741
Implantable Lead Serial Number: 649859
Implantable Lead Serial Number: 728741
Implantable Pulse Generator Implant Date: 20170214
Lead Channel Impedance Value: 679 Ohm
Lead Channel Impedance Value: 760 Ohm
Lead Channel Pacing Threshold Amplitude: 0.5 V
Lead Channel Pacing Threshold Amplitude: 1.3 V
Lead Channel Pacing Threshold Pulse Width: 0.4 ms
Lead Channel Pacing Threshold Pulse Width: 0.4 ms
Lead Channel Setting Pacing Amplitude: 1.8 V
Lead Channel Setting Pacing Amplitude: 2 V
Lead Channel Setting Pacing Pulse Width: 0.4 ms
Lead Channel Setting Sensing Sensitivity: 2.5 mV
Pulse Gen Serial Number: 718098

## 2019-12-23 NOTE — Progress Notes (Signed)
Remote pacemaker transmission.   

## 2019-12-24 ENCOUNTER — Ambulatory Visit: Payer: Medicare HMO | Admitting: Neurology

## 2019-12-26 ENCOUNTER — Ambulatory Visit: Payer: Medicare HMO | Admitting: Adult Health

## 2019-12-26 ENCOUNTER — Encounter: Payer: Self-pay | Admitting: Adult Health

## 2019-12-26 VITALS — BP 148/84 | HR 62 | Ht 67.5 in | Wt 231.0 lb

## 2019-12-26 DIAGNOSIS — G3184 Mild cognitive impairment, so stated: Secondary | ICD-10-CM

## 2019-12-26 MED ORDER — DONEPEZIL HCL 10 MG PO TABS: 10.0000 mg | ORAL_TABLET | Freq: Every day | ORAL | 3 refills | Status: DC

## 2019-12-26 NOTE — Progress Notes (Signed)
PATIENT: Tara Jackson DOB: November 16, 1940  REASON FOR VISIT: follow up- headache, memory loss HISTORY FROM: patient and husband  Chief Complaint  Patient presents with  . Annual Exam    rm 9, with husband, refills needed      HISTORY OF PRESENT ILLNESS: Tara Jackson is a 79 year old female with a history of headaches and memory loss. She returns today for follow-up. She reports that nortriptyline has been beneficial or her headaches. Although she still has a headache daily. She states that the headache is always on the right side of the head. She does have photophobia but denies phonophobia. Occasionally will have nausea. She states that her headache can get severe and rates her pain as a 10 out of 10 on the pain scale. She uses Excedrin for her headaches. In the past Tylenol No. 3 has been provided however the patient is not clear if she is taking this or not. The patient reports that she has been taking one tablet of nortriptyline however the prescription was written for 2 tablets at bedtime. Patient denies any new symptoms. She returns today for an evaluation.  HISTORY 04/07/15: Tara Jackson is a 79 year old female with a history of headaches, TIA and memory loss. She returns today for follow-up. She reports that today she has a headache. She states that it started approximately 1 month ago and has been daily in nature. She rates her pain a 10 out of 10 today. She describes her discomfort as pulsatile in the temporal region. She does describe a stiff neck. Denies nausea and vomiting. Does have some photophobia. Denies phonophobia. She has tried tramadol 100 mg with no relief. In the past the patient has been on tizanidine (2014) and according to the notes she received good benefit from this medication. The patient does not recall this medication and is unsure why she is no longer taking it-- other than her prescription may have ran out. The patient does feel that her memory is worse. She states that  last night she did not remember eating supper. The patient is able to complete most ADLs independently. Although she requires assistance with putting on her shoes and stockings. Her husband also picks out her clothes for her. She no longer does much cooking because she feels dizzy at times. She does not operate a motor vehicle. She continues to do the finances however last month she had some complications however she is going to try again this month. The patient is not currently on any medication for her memory. Her BuSpar was increased at the last visit however the patient is not aware of this. Her husband is with her today at the visit. 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  HISTORY: Tara Jackson is a 79 year old African lady who developed new-onset chronic daily headaches for the last 4 weeks. She states that she had some dental extractions done of her upper molar teeth about a week ago following which she noticed worsening of her pain which predominantly is in the temporal head regions but does spread all over. She states the pain is constant, severe. At times it is pulsatile. She was seen in the emergency room at [facility] on 07/23/12. She was treated with IV Dilaudid, Phenergan, Zofran and Benadryl in no hopping on the subtotal benefit. CT scan of the maxillary region showed no evidence of abscess associated with a total extraction but showed postoperative changes with communication between the oral  cavity and nasal maxillary sinus. There was evidence of sinusitis seen. CT scan of the head showed no acute abnormality. The headache is accompanied by some nausea and light sensitivity and occasional vomiting. The headache remains unchanged throughout the day. She is currently being treated with antibiotic course for a swollen gums. She has tried oxycodone, Ultram and Tylenol which have not helped. She does also have history of degenerative cervical spine disease  she does complain of neck pain and muscle tightness and spasm as well. She has had 4 minor falls since her last visit 2 months ago. She even had a brief episode of blacking out yesterday but her husband caught her. She found that her blood pressure was low and her primary care physician has reduced her blood pressure medication since then. She denies any prior history of migraine headaches of similar headaches in the past. She denies any blurred vision or loss of vision accompanying her headaches. 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   UPDATE 11/15/12 (LL): Tara Jackson comes to office for revisit for daily headache after wisdom tooth extraction. MRI cervical spine shows prominent spondylitic changes mostly at C5-6 and C4-5. with lateral disc osteophyte protrusions resulting in moderate foraminal narrowing on the right and mild canal stenosis without definite compression. MRI brain showed mild changes of microvascular ischemia. Advanced changes of chronic paranasal sinusitis. She states that her headaches are much better, about 84 % better than last visit. She does not have a headache today. She is using Tizanadine 4 mg for neck muscle tension. She had a recent sleep study which showed snoring but AHI within normal limits. Restless legs syndrome was suggested due to frequent leg movements. She states at night sometimes she feels like something is crawling on her lower legs and she feels like she must move them.   UPDATE 09/19/13 (LL): Since last visit Tara Jackson states that she has had some problems with abdominal pain and constipation. She recently went to the ER with abdominal pain and constipation times one week, despite the use of several enemas. Her headaches have not been bad in the last few months. She had a epidural pain injection in May at pain management. She denies any focal TIA or stroke symptoms,  but has numerous symptom complaints on review of systems. She is concerned about leg swelling and abdominal swelling, although her weight is up another 21 pounds since last visit. She states she has been having frequent falls without injury, but when she falls it's difficult for her to get up by herself and even difficult for her husband to get her up. She does not exercise and uses a motorized wheelchair his most of the time. Of particular concern today is an increase in hand tremor right greater than left, which is present at rest but more intense with action. It is causing her embarrassment. Update 06/03/2014 : She returns for follow-up after last visit 6 months ago. She is accompanied by husband. Patient reports new complaint of memory difficulties for the last 3 months. She states that this is mainly short-term memory. She cannot remember recent events and conversations and needs to be reminded multiple times. She often asked the same questions when she was told to answer a short while back. At times she feels that she has trouble completing sentences and can stop in mid sentence and search  for what she was trying to say. She has had some episodes of hypoglycemia but feels her memory difficulties and are not related to that. She was prescribed primidone at last visit for tremor but she did not fill the prescription but she remains on Inderal 40 mg which seems to help her tremor quite well. She was hospitalized about 4-6 weeks ago with chest pain and all workup was negative except heart rate was slow. She has long-standing history of depression and does take BuSpar 7.5 mg daily as well as Klonopin but feels her depression is not adequately treated. She has had no recurrent stroke or TIA symptoms. She remains on clopidogrel which is tolerating well without significant bleeding or bruising. She states her blood pressure and cholesterol control have been good Update 04/06/2015 : She returns for follow-up after last  visit 6 months ago. She is accompanied by her husband. Patient states that in memory diminished slightly worse and she has trouble remembering some recent information. However she can still manage to take care of her daily activities by herself. She was recently hospitalized 2 weeks ago and had a pacemaker implanted by Dr. Lovena Le. She continues to have daily headaches but off and on and not constant. The headaches vary in severity from 6/10-10/10. She takes Tylenol which seems to help. On average needs to 3 tablets a day at least. She needs to take tramadol occasionally which doesn't work as well. She has been taking Pamelor 50 mg capsule at night for her remote history of anxiety attacks. She states her blood pressure is well controlled and today it is low at 103/61. She is having trouble controlling her sugars which have fluctuated a bit she has not had any recurrent stroke or TIA symptoms. She remains on Plavix which is tolerating well without bleeding or bruising.  Update 01/12/2016 : She returns for follow-up after last visit 9 months ago. She is accompanied by husband. Patient states that she had a minor fall about 3 weeks ago and slipped and her head hit a car door. Since then she's been having daily headaches. She describes this as mainly posterior headaches which are mild to moderate in nature. She had been asked to increase her dose of Pamelor to 100 mg at night and last visit but she has not done that. She also admits to having some cognitive difficulties with decreased attention and poor short-term memory and at times having trouble remembering names and completing sentences. She has a lifelong history of depression and the past has tried multiple medications including Zoloft and Wellbutrin and Paxil but currently she is only on Klonopin which she takes for anxiety. She does participate in some stress relaxation activities like meditation daily but is not doing neck stretching exercises.She is on  klonopin for anxiety but not on anything for depression. Update 12/01/2016 : she returns for follow-up after last visit 10 months ago. She is accompanied by her husband. Patient is complaining of increasing falls. She's fallen 3 times in gone to the emergency room and once she did not go She states she falls mostly backwards. She falls without warning. One fall she actually fell forwards.she has no warning and this happened suddenly and she cannot stop herself. She does not lose consciousness. She denies any chest pain and palpitations breaking out into sweats or blurred or loss of vision prior to his episodes. She was seen in the emergency room and [id number] and CT scan of the head that showed no acute  abnormality. The tingling numbness in her feet which is occasionally they as well. Denies any weakness in her feet. Complains of long-standing tremors in her hands occasionally. Past has taken propranolol which hashelped quite well.she denies any drooling of saliva, increased facial expressions short stepped gait. Or freezing while walking. Update 03/07/2017 : she returns for follow-up after last visit 3 months ago. She is accompanied by husband. She states she is doing well and she has had no further falls since her last visit with me. She did undergo EMG nerve conduction study done by Dr. 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 Carotid ultrasound done in Dr. Lianne Moris office showed no significant extra-cranial stenosis. Transcranial Doppler study showed low mean flow velocities throughout with increased pulsatility indexes likely due to diffuse intracranial atherosclerosis. Patient states that she does walk a little bit with a walker but needs her husband to be close to her so she does not fall. She does have some burning in her feet. She takes Pamelor at night as well as tramadol when necessary as needed. She remains on Plavix which is tolerating well without bleeding or  bruising. She states her blood sugars are under good control. Her blood pressure normally is quite good but today it is elevated at 151/78. Update 12/11/2017 ; she returns for follow-up after last visit 9 months ago.  She is accompanied by her husband.  Patient states that she has noted subjective worsening of her memory.  She has had trouble remembering names and more recently telephone numbers which were well-known to her.  She denies feeling depressed.  She has had no falls or injuries.  She denies any headaches.  She was having significant symptoms of orthostatic dizziness and Dr. Virgina Jock recently started her on Midodrin which seems to be helping.  She was seen today in his office and had lab work which showed significantly suppressed TSH of level 0.02.  She is on Synthroid 100 mg daily.  She was admitted in August 2019 with a fall to [facility].  She was discharged home with home physical therapy which she finished ~end of September.  She walks mostly endorsed using a walker but needs some help to get up and has to hold onto the husband until she feels better.  Her lab work from today shows serum creatinine of 2.0.  Hemoglobin A1c of 7.5 which actually is down from 7.9 from August.  She complains of diminished appetite and in fact has lost weight.  She has no family history of dementia.  Update 02/20/2018 : She returns for follow-up after last visit 2 months ago.  She is accompanied by her husband.  She continues to have memory difficulties and cognitive impairment which husband feels may have improved with the patient disagrees.  She had lab work for reversible causes of cognitive impairment done at her primary physician's office on 1//7/20 which was fairly unremarkable except for slightly elevated TSH.  She has seen a primary care physician and dose of Synthroid has been reduced to 75 mcg.Marland Kitchen  She was unable to obtain an MRI due to pacemaker but had CT scan of the head done instead on 12/21/2017.wWhich  showed mild generalized atrophy and no acute findings.  EEG done on 01/12/2018 was normal.  She has difficulty ambulating and uses a walker and a cane occasionally but mostly uses a scooter.  She is high risk for falls but has had no major injuries.  On Mini-Mental status score testing  in the office today she scored 19 out of 30 which is significant decline from 30 out of 30 from a year ago.  Update 12/27/2019 JM: Tara Jackson returns for medication refill of Aricept as she has not been seen since 02/2018.  At that time, evaluated by Dr. Leonie Man for complaints of memory and cognitive difficulties.  She has tolerated Aricept 10 mg nightly without side effects.  Subjectively, she reports worsening of her cognition per husband reports that has been stable.  MMSE today 19/30 (prior 19/30).  She does report ongoing depression/anxiety currently treated with buspirone, clonazepam twice daily and lorazepam as needed.  She is sedentary primarily transferring via wheelchair and will occasionally ambulate short distances.  She does not participate in any type of routine activity or exercise.  She returns today for evaluation.        REVIEW OF SYSTEMS: Out of a complete 14 system review of symptoms, the patient complains of those listed in HPI and all other systems negative    ALLERGIES: Allergies  Allergen Reactions  . Iodine Other (See Comments)    ONLY IV--Makes unconscious  . Iohexol Other (See Comments)     Code: SOB, Desc: cardiac arrest w/ iv contrast, has never used 13 hr prep//alice calhoun, Onset Date: [id number]   . Metoclopramide Hcl Other (See Comments)    suicidal  . Sulfa Antibiotics Hives  . Lactose Intolerance (Gi) Diarrhea  . Lansoprazole Nausea And Vomiting    HOME MEDICATIONS: Outpatient Medications Prior to Visit  Medication Sig Dispense Refill  . acetaminophen (TYLENOL) 650 MG CR tablet Take 650 mg by mouth every 8 (eight) hours as needed for pain.    Marland Kitchen albuterol (VENTOLIN HFA) 108  (90 Base) MCG/ACT inhaler INHALE 2 INHALATIONS 3 TIMES A DAY AS NEEDED FOR COUGH/WHEEZE 18 each PRN  . budesonide-formoterol (SYMBICORT) 80-4.5 MCG/ACT inhaler Inhale 2 puffs into the lungs 2 (two) times daily.    . busPIRone (BUSPAR) 10 MG tablet Take 1 tablet (10 mg total) by mouth every morning. 60 tablet 1  . Cholecalciferol (VITAMIN D-3 PO) Take 2 tablets by mouth every morning.     . clonazePAM (KLONOPIN) 0.5 MG tablet Take 0.5-1 mg by mouth See admin instructions. Takes 1mg  in am and 0.5mg  in pm.    . clopidogrel (PLAVIX) 75 MG tablet Take 75 mg by mouth every morning.    . cyanocobalamin 100 MCG tablet Take 100 mcg by mouth daily.    Marland Kitchen dicyclomine (BENTYL) 10 MG capsule Take 1 capsule (10 mg total) by mouth 3 (three) times daily before meals. (Patient taking differently: Take 10 mg by mouth in the morning and at bedtime. )    . famotidine (PEPCID) 20 MG tablet TAKE 1 TABLET BY MOUTH EVERY DAY AFTER SUPPER 90 tablet 3  . furosemide (LASIX) 20 MG tablet TAKE 1 TABLET (20 MG TOTAL) BY MOUTH DAILY AS NEEDED FOR FLUID OR EDEMA. 90 tablet 1  . glipiZIDE (GLUCOTROL) 10 MG tablet Take 20 mg by mouth 2 (two) times daily before a meal.     . guaiFENesin (MUCINEX) 600 MG 12 hr tablet Take 600 mg by mouth 2 (two) times daily as needed for cough.    . insulin NPH (HUMULIN N,NOVOLIN N) 100 UNIT/ML injection Inject 0-20 Units into the skin See admin instructions. Novolin-N Give 20 unit in morning; give  5 units at 4pm; evening dose as needed.  If less than 150 blood sugar= 0units.  If greater than 200 blood sugar=  give 10units to decrease by 100 blood sugar    . insulin regular (NOVOLIN R,HUMULIN R) 100 units/mL injection Inject 0-25 Units into the skin See admin instructions. Give 25 unit in morning; give 20units at 4pm; evening dose as needed.  If less than 150 blood sugar= 0units.  If greater than 200 blood sugar= give 10units to decrease by 100 blood sugar    . levothyroxine (SYNTHROID) 100 MCG tablet  Take 100 mcg by mouth daily before breakfast.    . lidocaine (LIDODERM) 5 % Place 3 patches onto the skin daily as needed (pain). Remove & Discard patch within 12 hours or as directed by MD    . linaclotide (LINZESS) 290 MCG CAPS capsule Take 290 mcg by mouth daily before breakfast.    . LORazepam (ATIVAN) 0.5 MG tablet Take 0.5 mg by mouth daily as needed for anxiety.    . Menthol-Methyl Salicylate (MUSCLE RUB) 10-15 % CREA Apply 1 application topically as needed for muscle pain.    . midodrine (PROAMATINE) 2.5 MG tablet Take 1 tablet (2.5 mg total) by mouth 3 (three) times daily with meals. (Patient taking differently: Take 2.5 mg by mouth as needed. As needed if blood pressure goes to low.) 90 tablet 6  . nortriptyline (PAMELOR) 50 MG capsule TAKE 2 CAPSULES (100MG ) AT BEDTIME (Patient taking differently: Take 50 mg by mouth 2 (two) times daily. ) 180 capsule 1  . ondansetron (ZOFRAN) 4 MG tablet Take 4 mg by mouth as needed for nausea or vomiting.   0  . OXYGEN Inhale 2 L into the lungs as needed. Used at overnight with a concentrator     . pantoprazole (PROTONIX) 40 MG tablet Take 1 tablet (40 mg total) by mouth daily. 90 tablet 3  . polyethylene glycol (MIRALAX / GLYCOLAX) packet Take 17 g by mouth daily as needed for mild constipation.     . potassium chloride SA (K-DUR,KLOR-CON) 20 MEQ tablet Take 1 tablet (20 mEq total) by mouth 2 (two) times daily. 10 tablet 0  . Probiotic Product (ALIGN) 4 MG CAPS Take 1 capsule by mouth daily.    . Pumpkin Seed-Soy Germ (AZO BLADDER CONTROL/GO-LESS PO) Take 1 tablet by mouth daily as needed (bladder control).    . rosuvastatin (CRESTOR) 20 MG tablet Take 20 mg by mouth daily.     . solifenacin (VESICARE) 5 MG tablet Take 5 mg by mouth daily.    Marland Kitchen terconazole (TERAZOL 7) 0.4 % vaginal cream Place 1 applicator vaginally daily as needed for itching.    . donepezil (ARICEPT) 10 MG tablet TAKE 1 TABLET BY MOUTH EVERYDAY AT BEDTIME 90 tablet 0   No  facility-administered medications prior to visit.    PAST MEDICAL HISTORY: Past Medical History:  Diagnosis Date  . Anemia   . Anxiety   . Arthritis   . Asthma   . Brittle bone disease   . CKD (chronic kidney disease)   . Complication of anesthesia    hard to wake up after anesthesia, trouble turning head  . Coronary artery disease, non-occlusive 2014   a. minimal CAD by cath in 2014 with 40% RI stenosis. b. low-risk NST in 11/2014.  Marland Kitchen Depression   . Diabetes mellitus type 2, insulin dependent ([place])   . Diabetic neuropathy ([place])   . Diabetic retinopathy ([place])    NPDR OU  . Diastolic dysfunction, left ventricle 11/30/14   EF 55%, grade 1 DD  . DVT (deep venous thrombosis) ([place])    "  18 in RLE; 7 LLE prior to PE" (03/24/2015)  . Family history of adverse reaction to anesthesia    " the whole family is hard to wake up"  . Glaucoma   . H/O hiatal hernia   . Hyperlipidemia   . Hypertension associated with diabetes ([place])   . Hypertensive retinopathy    OU  . Hypothyroidism   . Kidney stones    "passed them"  . Macular degeneration    Exu OU  . Memory loss 06/03/2014  . On home oxygen therapy    "2L oxygen concentrator @ night"  . Osteoporosis   . Oxygen desaturation during sleep    wears 2 liters of oxygen at night   . Palpitations 35years ago   Cardionet monitor - revealed mostly normal sinus rhythm, sinus bradycardia with first-degree A-V block heart rates mostly in the 50s and 60s with some 70s. No arrhythmias, PVCs or PACs noted.  . PE (pulmonary thromboembolism) ([place]) x 3, last one was ~ 2003   history of recurrent RLE DVT with PE - last PE ~>13 yrs; Maintained on Plavix  . Pneumonia   . Presence of permanent cardiac pacemaker   . Restless legs syndrome   . Sleep apnea    cpap disontinued; test done 07/14/2008 ordered per  Byrum  . Spinal headache   . Spinal stenosis    L 3, L 4 and L 5, and C 1, C 2 and C3  . Stroke [facility]) 04-19-2013   tia and stroke. Weakness Rt  hand  . 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 s/p [employer] PPM in 03/2015 by Dr. Lovena Le secondary to sinus node dysfunction.   Marland Kitchen TIA (transient ischemic attack) March 2014    PAST SURGICAL HISTORY: Past Surgical History:  Procedure Laterality Date  . ABDOMINAL HYSTERECTOMY    . APPENDECTOMY    . BACK SURGERY     steroid inj  . CATARACT EXTRACTION Bilateral   . CATARACT EXTRACTION W/ INTRAOCULAR LENS  IMPLANT, BILATERAL Bilateral 2000s  . CHOLECYSTECTOMY    . EP IMPLANTABLE DEVICE N/A 03/24/2015   [employer] PPM, Dr. Lovena Le  . ESOPHAGOGASTRODUODENOSCOPY  08/09/2011   Procedure: ESOPHAGOGASTRODUODENOSCOPY (EGD);  Surgeon: Jeryl Columbia, MD;  Location: [facility] ENDOSCOPY;  Service: Endoscopy;  Laterality: N/A;  . ESOPHAGOGASTRODUODENOSCOPY (EGD) WITH PROPOFOL N/A 11/12/2013   Procedure: ESOPHAGOGASTRODUODENOSCOPY (EGD) WITH PROPOFOL;  Surgeon: Jeryl Columbia, MD;  Location: [facility] ENDOSCOPY;  Service: Endoscopy;  Laterality: N/A;  . ESOPHAGOGASTRODUODENOSCOPY (EGD) WITH PROPOFOL N/A 05/05/2016   Procedure: ESOPHAGOGASTRODUODENOSCOPY (EGD) WITH PROPOFOL;  Surgeon: Clarene Essex, MD;  Location: [facility] ENDOSCOPY;  Service: Endoscopy;  Laterality: N/A;  . EYE SURGERY Bilateral    Cat Sx OU  . GLAUCOMA SURGERY Bilateral 2000s   "laser"  . HOT HEMOSTASIS  08/09/2011   Procedure: HOT HEMOSTASIS (ARGON PLASMA COAGULATION/BICAP);  Surgeon: Jeryl Columbia, MD;  Location: [facility] ENDOSCOPY;  Service: Endoscopy;  Laterality: N/A;  . HOT HEMOSTASIS N/A 11/12/2013   Procedure: HOT HEMOSTASIS (ARGON PLASMA COAGULATION/BICAP);  Surgeon: Jeryl Columbia, MD;  Location: [facility] ENDOSCOPY;  Service: Endoscopy;  Laterality: N/A;  . INSERT / REPLACE / REMOVE PACEMAKER  03/24/2015  . IRIDOTOMY / IRIDECTOMY Bilateral   . JOINT REPLACEMENT    . KNEE SURGERY Left done with 2nd left knee replacement   spacer bar placement  . LEFT HEART CATHETERIZATION WITH CORONARY ANGIOGRAM N/A 02/14/2012   Procedure: LEFT HEART CATHETERIZATION WITH  CORONARY ANGIOGRAM;  Surgeon: Leonie Man, MD;  Location:  [place] CATH LAB;  Service: Cardiovascular:: no evidence of obstructive coronary disease ,low EDP with normal EF  . LEV DOPPLER  05/29/2012   RIGHT EXTREM. NORMAL VENOUS DUPLEX  . NM MYOCAR PERF WALL MOTION  01/26/2012; 11/2014   a. EF 79%,LV normal,ST SEGMENT CHANGE SUGGESTIVE OF ISCHEMIA.; LOW RISK, EF 60%. No ischemia or infarction. No wall motion abnormality   . PACEMAKER PLACEMENT    . REVISION TOTAL KNEE ARTHROPLASTY Left   . TONSILLECTOMY    . TOTAL KNEE ARTHROPLASTY Bilateral   . TRANSTHORACIC ECHOCARDIOGRAM  11/2017   11/2017: EF 60-65%. Gr1 DD. PAP ~33 mmHg.  Mild TR. PPM leads in RA & RV.   Marland Kitchen TRANSTHORACIC ECHOCARDIOGRAM  2014; October 2016   a. EF 01-02%; normal diastolic pressures, no regional wall motion motion abnormalities;; b/ F 55-60%. Normal wall motion. GR 1 DD. No valve lesions  . TRIGGER FINGER RELEASE  11/22/2011   Procedure: RELEASE TRIGGER FINGER/A-1 PULLEY;  Surgeon: Meredith Pel, MD;  Location: [place];  Service: Orthopedics;  Laterality: Left;  Left trigger thumb release  . TRIGGER FINGER RELEASE Right yrs ago    FAMILY HISTORY: Family History  Problem Relation Age of Onset  . Cancer Mother   . Diabetes Mother   . Cancer - Prostate Father   . Diabetes Father   . Retinal detachment Son   . Diabetes Sister   . Diabetes Brother   . Diabetes Paternal Grandmother     SOCIAL HISTORY: Social History   Socioeconomic History  . Marital status: Married    Spouse name: [name]  . Number of children: 2  . Years of education: college  . Highest education level: Not on file  Occupational History  . Occupation: retired  Tobacco Use  . Smoking status: Passive Smoke Exposure - Never Smoker  . Smokeless tobacco: Never Used  Vaping Use  . Vaping Use: Never used  Substance and Sexual Activity  . Alcohol use: No    Alcohol/week: 0.0 standard drinks  . Drug use: No  . Sexual activity: Yes  Other  Topics Concern  . Not on file  Social History Narrative   Married [name].) married 53 years.   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    Social Determinants of Health   Financial Resource Strain:   . Difficulty of Paying Living Expenses: Not on file  Food Insecurity:   . Worried About Charity fundraiser in the Last Year: Not on file  . Ran Out of Food in the Last Year: Not on file  Transportation Needs:   . Lack of Transportation (Medical): Not on file  . Lack of Transportation (Non-Medical): Not on file  Physical Activity:   . Days of Exercise per Week: Not on file  . Minutes of Exercise per Session: Not on file  Stress:   . Feeling of Stress : Not on file  Social Connections:   . Frequency of Communication with Friends and Family: Not on file  . Frequency of Social Gatherings with Friends and Family: Not on file  . Attends Religious Services: Not on file  . Active Member of Clubs or Organizations: Not on file  . Attends Archivist Meetings: Not on file  . Marital Status: Not on file  Intimate Partner Violence:   . Fear of Current or Ex-Partner: Not on file  . Emotionally Abused: Not on file  . Physically Abused: Not on file  .  Sexually Abused: Not on file      PHYSICAL EXAM  Vitals:   12/26/19 0917  BP: (!) 148/84  Pulse: 62  Weight: 231 lb (104.8 kg)  Height: 5' 7.5" (1.715 m)   Body mass index is 35.65 kg/m. MMSE - Mini Mental State Exam 12/26/2019 02/20/2018 04/12/2016  Orientation to time 3 1 5   Orientation to Place 4 5 5   Registration 3 3 3   Attention/ Calculation 1 1 5   Recall 1 0 3  Language- name 2 objects 2 2 2   Language- repeat 1 1 1   Language- follow 3 step command 2 3 3   Language- read & follow direction 1 1 1   Write a sentence 1 1 1   Copy design 0 1 1  Total score 19 19 30     Generalized: Well developed obese elderly African-American lady, in no acute distress,   Neurological examination    Mentation: Alert oriented to time, place, history taking. Follows all commands speech and language fluent.   Cranial nerve II-XII: Pupils were equal round reactive to light. Extraocular movements were full, visual field were full on confrontational test. Facial sensation and strength were normal. Uvula tongue midline. Head turning and shoulder shrug  were normal and symmetric. Motor: The motor testing reveals 5 over 5 strength LUE, LLE, RLE. 4/5 in RUE. Good symmetric motor tone is noted throughout.  Sensory: Sensory testing is intact to soft touch on all 4 extremities. No evidence of extinction is noted. Diminished vibration sensation of her ankles and toes bilaterally. Coordination: Cerebellar testing reveals good finger-nose-finger and heel-to-shin bilaterally.  Gait and station: Deferred Reflexes: Deep tendon reflexes are symmetric and Depressed bilaterally.        ASSESSMENT AND PLAN 79 y.o. year old female  has a past medical history of Anemia, Anxiety, Arthritis, Asthma, Brittle bone disease, CKD (chronic kidney disease), Complication of anesthesia, Coronary artery disease, non-occlusive (2014), Depression, Diabetes mellitus type 2, insulin dependent ([place]), Diabetic neuropathy ([place]), Diabetic retinopathy ([place]), Diastolic dysfunction, left ventricle (11/30/14), DVT (deep venous thrombosis) ([place]), Family history of adverse reaction to anesthesia, Glaucoma, H/O hiatal hernia, Hyperlipidemia, Hypertension associated with diabetes ([place]), Hypertensive retinopathy, Hypothyroidism, Kidney stones, Macular degeneration, Memory loss (06/03/2014), On home oxygen therapy, Osteoporosis, Oxygen desaturation during sleep, Palpitations (35years ago), PE (pulmonary thromboembolism) ([place]) (x 3, last one was ~ 2003), Pneumonia, Presence of permanent cardiac pacemaker, Restless legs syndrome, Sleep apnea, Spinal headache, Spinal stenosis, Stroke ([place]) (04-19-2013), Symptomatic bradycardia (03/24/2015), and TIA  (transient ischemic attack) (March 2014). here with: complaints of memory difficulties due to mild cognitive impairment versus early dementia  Subjectively reports memory worsening likely multifactorial in setting of possibly worsening MCI vs early dementia (although MMSE stable over the past 2 years), deconditioning with limited activity and exercise, polypharmacy, multiple comorbidities and underlying depression/anxiety.   Encouraged follow-up with PCP regarding depression and anxiety complaints Offered formal neurocognitive evaluation as she is highly concerned of having "Alzheimer's" per husband declines at this time -advised to call office if interested in the future Continue Aricept 10 mg nightly -refill provided She was also encouraged to increase participation in cognitively challenging activities like solving crossword puzzles, playing bridge and sodoku as well as increasing physical activity and exercise, maintaining healthy diet, and adequate sleep   Follow-up in 1 year or call earlier if needed   CC:  [place] provider: Dr. Rhea Bleacher, Jenny Reichmann, MD    I spent 30 minutes of face-to-face and non-face-to-face time with patient and husband.  This included previsit  chart review, lab review, study review, order entry, electronic health record documentation, patient education and discussion regarding MCI vs early dementia, completion and review of MMSE, subjective memory worsening and answered all related questions, continuation of Aricept and answered all other questions to patient and husband satisfaction   Frann Rider, AGNP-BC  [facility] [address] [place], [place] [address]  Phone [phone] Fax [phone] Note: This document was prepared with digital dictation and possible smart phrase technology. Any transcriptional errors that result from this process are unintentional.

## 2019-12-26 NOTE — Patient Instructions (Signed)
Your Plan:  Continue Aricept 10 mg nightly -refill provided    Follow-up in 1 year or call earlier if needed     Thank you for coming to see [place] at [facility]. I hope we have been able to provide you high quality care today.  You may receive a patient satisfaction survey over the next few weeks. We would appreciate your feedback and comments so that we may continue to improve ourselves and the health of our patients.

## 2019-12-30 NOTE — Progress Notes (Signed)
I agree with the above plan 

## 2020-01-21 ENCOUNTER — Ambulatory Visit: Payer: Medicare HMO | Admitting: Cardiology

## 2020-01-21 ENCOUNTER — Encounter: Payer: Self-pay | Admitting: Cardiology

## 2020-01-21 ENCOUNTER — Other Ambulatory Visit: Payer: Self-pay

## 2020-01-21 DIAGNOSIS — G4733 Obstructive sleep apnea (adult) (pediatric): Secondary | ICD-10-CM

## 2020-01-21 DIAGNOSIS — I495 Sick sinus syndrome: Secondary | ICD-10-CM | POA: Diagnosis not present

## 2020-01-21 DIAGNOSIS — I5032 Chronic diastolic (congestive) heart failure: Secondary | ICD-10-CM | POA: Diagnosis not present

## 2020-01-21 DIAGNOSIS — I10 Essential (primary) hypertension: Secondary | ICD-10-CM

## 2020-01-21 DIAGNOSIS — R600 Localized edema: Secondary | ICD-10-CM

## 2020-01-21 DIAGNOSIS — E785 Hyperlipidemia, unspecified: Secondary | ICD-10-CM

## 2020-01-21 DIAGNOSIS — I951 Orthostatic hypotension: Secondary | ICD-10-CM

## 2020-01-21 DIAGNOSIS — Z950 Presence of cardiac pacemaker: Secondary | ICD-10-CM

## 2020-01-21 MED ORDER — FUROSEMIDE 20 MG PO TABS: 20.0000 mg | ORAL_TABLET | Freq: Every day | ORAL | 2 refills | Status: DC | PRN

## 2020-01-21 NOTE — Progress Notes (Signed)
Primary Care Provider: Shon Baton, MD Cardiologist: Glenetta Hew, MD Electrophysiologist: Cristopher Peru, MD  Nephrology: Dr. Arty Baumgartner   Clinic Note: Chief Complaint  Patient presents with  . Leg Swelling    She has noted increased edema over the last 2 to 3 days.  . Follow-up   HPI:    Tara Jackson is a 79 y.o. female with a PMH notable for history of SSS s/p PPM, history of bilateral DVT/PE (on long-term Plavix due to issues with warfarin), chronic LE edema (venous stasis), CKD-3, DM-2 and orthostatic hypotension (PRN midodrine) who presents today for routine follow-up.  Intermittent chest pain episodes have been evaluated with negative ischemic evaluations over past several years.  Problem List Items Addressed This Visit    Pacemaker (Chronic)    Followed by EP.      Syncope due to orthostatic hypotension (Chronic)    No further episodes since antibiotics were discontinued with exception of propranolol.  Allow permissive hypertension Adequate hydration and avoiding dehydration with diuretics. Continue midodrine      Hyperlipidemia with target LDL less than 130 (Chronic)    As of March, lipids look great.   She tried stopping rosuvastatin, but it did make a difference with her fatigue and weakness.      Relevant Medications   propranolol (INDERAL) 60 MG tablet   furosemide (LASIX) 20 MG tablet   Essential hypertension (Chronic)    Blood pressure looks pretty good.  We are allowing for mild permissive hypertension.  She is now on midodrine to try to keep her blood pressure up.   She is on Inderal for tremor/headache and rate control.      Relevant Medications   propranolol (INDERAL) 60 MG tablet   furosemide (LASIX) 20 MG tablet   Sleep apnea (Chronic)    On nighttime oxygen.  Not sure if she is still using CPAP.      Edema of both legs (Chronic)    We discussed doing leg exercises to avoid clots.  Increasing Lasix dosing (see patient  instructions) Support stockings possible, otherwise with the Ace wraps. Foot elevation      Chronic diastolic heart failure (HCC) (Chronic)    She clearly has diastolic dysfunction, but I do not know how much of her symptoms are truly related to diastolic heart failure.  Edema seems to be more like the venous stasis and OHS.  No longer on afterload reduction because of issues with orthostatic hypotension and falls. Only on Inderal which is not necessarily for CHF or hypertension.  We talked about sliding scale Lasix for worsening edema. ->  She is currently taking every other day Lasix.  Colodonato weight goes back down and edema goes back down to baseline, to take daily Lasix with an extra dose today.      Relevant Medications   propranolol (INDERAL) 60 MG tablet   furosemide (LASIX) 20 MG tablet   Sinus node dysfunction (HCC) (Chronic)    Syncope after pacemaker.  Followed by EP  No arrhythmias noted on pacemaker interrogation.      Relevant Medications   propranolol (INDERAL) 60 MG tablet   furosemide (LASIX) 20 MG tablet      Tara Jackson was last seen by me in January 2020 for hospital follow-up.  She is pretty stable on again feeling heart rate going up and down depending on stress or activity.  Still having intermittent falls because of poor balance.  No syncope or near syncope.  Has  been on CPAP, with nighttime oxygen.   Seen April 17, 2019 by Bunnie Domino, NP  April 19, 2019 by Roby Lofts, PA -> intermittent chest pain since earlier in the year.  Felt to be atypical chest pain.  Thought GI.  July 30, 2019 by Almyra Deforest, PA via telemedicine (following ER on June 11): Noted still having chest pain which was a month.  Recommended Myoview.  Recent Hospitalizations:   July 19, 2019-ER:  Reviewed  CV studies:    The following studies were reviewed today: (if available, images/films reviewed: From Epic Chart or Care Everywhere) . Myoview 08/22/2019: EF 55 to  65%.  No ischemia or infarction.  LOW RISK:   Interval History:   Tara Jackson returns for routine f/u doing fairly well.  No major issues besides noting that her LE edema has "picked up" over the last 2-3 days -- she has not yet taken additional lasix.  Still taking QOD.   Very limited by knee/ hip pain & weakness -- very poor balance with tendency to fall.    Usually wheelchair, but can transfer to & from chair - bed or commode.  Can walk from bed to bathroom with cane - bu is bery unsteady.    She is scared to walk further - b/c scared of falling.    Despite edema, has issues with dry skin & skin tears.    Can note "fast HR" sensation - pulse check in high 80s-90s.  Has not noted true tachycardia (HR >100).  Sleeps with O2 2-3 L & not CPAP.  Mild orthopnea, but no PND.    Still has MSK-like diffuse CP off & on - associated with movements & @ rest > exertion.  CV Review of Symptoms (Summary) positive for - chest pain, dyspnea on exertion, edema, orthopnea, palpitations and CP - is same atypical Sx; edema up over last ~3 d.  can get lightheaded & dizzy - positional & @ rest negative for - paroxysmal nocturnal dyspnea, rapid heart rate, shortness of breath or syncope/near syncope; TIA/amaurosis fugax, -- does not walk enough to assess "claudication"  The patient does not have symptoms concerning for COVID-19 infection (fever, chills, cough, or new shortness of breath).   REVIEWED OF SYSTEMS   Review of Systems  Constitutional: Positive for malaise/fatigue (chronically low Energy). Negative for weight loss (gain).  HENT: Negative for congestion and nosebleeds.   Respiratory: Positive for cough and shortness of breath.        Baseline  Cardiovascular: Positive for leg swelling.  Gastrointestinal: Positive for abdominal pain. Negative for blood in stool and melena.       Appetite is up & down  Genitourinary: Negative for hematuria.  Musculoskeletal: Positive for back pain, falls (none  in last ~2-3 months) and joint pain.       Activity limited by Back Pain & Leg weakness - otw, has tendency to fall  Neurological: Positive for dizziness and weakness (Knees /hips with associated muscles). Negative for focal weakness and headaches.       Poor balance  Psychiatric/Behavioral: Negative for depression and memory loss. The patient is nervous/anxious and has insomnia.    I have reviewed and (if needed) personally updated the patient's problem list, medications, allergies, past medical and surgical history, social and family history.   PAST MEDICAL HISTORY   Past Medical History:  Diagnosis Date  . Anemia   . Anxiety   . Arthritis   . Asthma   . Brittle bone  disease   . CKD (chronic kidney disease)   . Complication of anesthesia    hard to wake up after anesthesia, trouble turning head  . Coronary artery disease, non-occlusive 2014   a. minimal CAD by cath in 2014 with 40% RI stenosis. b. low-risk NST in 11/2014.  Marland Kitchen Depression   . Diabetes mellitus type 2, insulin dependent ([place])   . Diabetic neuropathy ([place])   . Diabetic retinopathy ([place])    NPDR OU  . Diastolic dysfunction, left ventricle 11/30/14   EF 55%, grade 1 DD  . DVT (deep venous thrombosis) ([place])    "18 in RLE; 7 LLE prior to PE" (03/24/2015)  . Family history of adverse reaction to anesthesia    " the whole family is hard to wake up"  . Glaucoma   . H/O hiatal hernia   . Hyperlipidemia   . Hypertension associated with diabetes ([place])   . Hypertensive retinopathy    OU  . Hypothyroidism   . Kidney stones    "passed them"  . Macular degeneration    Exu OU  . Memory loss 06/03/2014  . On home oxygen therapy    "2L oxygen concentrator @ night"  . Osteoporosis   . Oxygen desaturation during sleep    wears 2 liters of oxygen at night   . Palpitations 35years ago   Cardionet monitor - revealed mostly normal sinus rhythm, sinus bradycardia with first-degree A-V block heart rates mostly in the 50s and 60s  with some 70s. No arrhythmias, PVCs or PACs noted.  . PE (pulmonary thromboembolism) ([place]) x 3, last one was ~ 2003   history of recurrent RLE DVT with PE - last PE ~>13 yrs; Maintained on Plavix  . Pneumonia   . Presence of permanent cardiac pacemaker   . Restless legs syndrome   . Sleep apnea    cpap disontinued; test done 07/14/2008 ordered per  [place]  . Spinal headache   . Spinal stenosis    L 3, L 4 and L 5, and C 1, C 2 and C3  . Stroke [facility]) 04-19-2013   tia and stroke. Weakness Rt hand  . 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 s/p [employer] PPM in 03/2015 by Dr. Lovena Le secondary to sinus node dysfunction.   Marland Kitchen TIA (transient ischemic attack) March 2014    PAST SURGICAL HISTORY   Past Surgical History:  Procedure Laterality Date  . ABDOMINAL HYSTERECTOMY    . APPENDECTOMY    . BACK SURGERY     steroid inj  . CATARACT EXTRACTION Bilateral   . CATARACT EXTRACTION W/ INTRAOCULAR LENS  IMPLANT, BILATERAL Bilateral 2000s  . CHOLECYSTECTOMY    . EP IMPLANTABLE DEVICE N/A 03/24/2015   [facility] PPM, Dr. Lovena Le  . ESOPHAGOGASTRODUODENOSCOPY  08/09/2011   Procedure: ESOPHAGOGASTRODUODENOSCOPY (EGD);  Surgeon: Jeryl Columbia, MD;  Location: [facility];  Service: Endoscopy;  Laterality: N/A;  . ESOPHAGOGASTRODUODENOSCOPY (EGD) WITH PROPOFOL N/A 11/12/2013   Procedure: ESOPHAGOGASTRODUODENOSCOPY (EGD) WITH PROPOFOL;  Surgeon: Jeryl Columbia, MD;  Location: [facility] ENDOSCOPY;  Service: Endoscopy;  Laterality: N/A;  . ESOPHAGOGASTRODUODENOSCOPY (EGD) WITH PROPOFOL N/A 05/05/2016   Procedure: ESOPHAGOGASTRODUODENOSCOPY (EGD) WITH PROPOFOL;  Surgeon: Clarene Essex, MD;  Location: [facility] ENDOSCOPY;  Service: Endoscopy;  Laterality: N/A;  . EYE SURGERY Bilateral    Cat Sx OU  . GLAUCOMA SURGERY Bilateral 2000s   "laser"  . HOT HEMOSTASIS  08/09/2011   Procedure: HOT HEMOSTASIS (ARGON PLASMA COAGULATION/BICAP);  Surgeon: Jeryl Columbia, MD;  Location: [facility] ENDOSCOPY;  Service: Endoscopy;   Laterality: N/A;  . HOT HEMOSTASIS N/A 11/12/2013   Procedure: HOT HEMOSTASIS (ARGON PLASMA COAGULATION/BICAP);  Surgeon: Jeryl Columbia, MD;  Location: [facility];  Service: Endoscopy;  Laterality: N/A;  . INSERT / REPLACE / REMOVE PACEMAKER  03/24/2015  . IRIDOTOMY / IRIDECTOMY Bilateral   . JOINT REPLACEMENT    . KNEE SURGERY Left done with 2nd left knee replacement   spacer bar placement  . LEFT HEART CATHETERIZATION WITH CORONARY ANGIOGRAM N/A 02/14/2012   Procedure: LEFT HEART CATHETERIZATION WITH CORONARY ANGIOGRAM;  Surgeon: Leonie Man, MD;  Location[facility] C[facility] LAB;  Service: Cardiovascular:: no evidence of obstructive coronary disease ,low EDP with normal EF  . LEV DOPPLER  05/29/2012   RIGHT EXTREM. NORMAL VENOUS DUPLEX  . NM MYOCAR PERF WALL MOTION  08/2019   EF 55 to 65%.  No ischemia or infarction.  LOW RISK:  . PACEMAKER PLACEMENT    . REVISION TOTAL KNEE ARTHROPLASTY Left   . TONSILLECTOMY    . TOTAL KNEE ARTHROPLASTY Bilateral   . TRANSTHORACIC ECHOCARDIOGRAM  11/2017   11/2017: EF 60-65%. Gr1 DD. PAP ~33 mmHg.  Mild TR. PPM leads in RA & RV.   Marland Kitchen TRIGGER FINGER RELEASE  11/22/2011   Procedure: RELEASE TRIGGER FINGER/A-1 PULLEY;  Surgeon: Meredith Pel, MD;  Location: [place];  Service: Orthopedics;  Laterality: Left;  Left trigger thumb release  . TRIGGER FINGER RELEASE Right yrs ago    Immunization History  Administered Date(s) Administered  . Influenza Whole 10/22/2009  . Influenza, High Dose Seasonal PF 10/24/2016, 11/07/2017, 10/22/2019  . Influenza-Unspecified 12/09/2014, 11/08/2018  . PFIZER SARS-COV-2 Vaccination 03/12/2019, 04/02/2019  . Pneumococcal Polysaccharide-23 02/07/2005    MEDICATIONS/ALLERGIES   Current Meds  Medication Sig  . acetaminophen (TYLENOL) 650 MG CR tablet Take 650 mg by mouth every 8 (eight) hours as needed for pain.  Marland Kitchen albuterol (VENTOLIN HFA) 108 (90 Base) MCG/ACT inhaler INHALE 2 INHALATIONS 3 TIMES A DAY AS NEEDED FOR  COUGH/WHEEZE  . budesonide-formoterol (SYMBICORT) 80-4.5 MCG/ACT inhaler Inhale 2 puffs into the lungs 2 (two) times daily.  . busPIRone (BUSPAR) 10 MG tablet Take 1 tablet (10 mg total) by mouth every morning.  . Cholecalciferol (VITAMIN D-3 PO) Take 2 tablets by mouth every morning.   . clonazePAM (KLONOPIN) 0.5 MG tablet Take 0.5-1 mg by mouth See admin instructions. Takes 1mg  in am and 0.5mg  in pm.  . clopidogrel (PLAVIX) 75 MG tablet Take 75 mg by mouth every morning.  . cyanocobalamin 100 MCG tablet Take 100 mcg by mouth daily.  Marland Kitchen dicyclomine (BENTYL) 10 MG capsule Take 1 capsule (10 mg total) by mouth 3 (three) times daily before meals. (Patient taking differently: Take 10 mg by mouth in the morning and at bedtime.)  . donepezil (ARICEPT) 10 MG tablet Take 1 tablet (10 mg total) by mouth at bedtime.  . famotidine (PEPCID) 20 MG tablet TAKE 1 TABLET BY MOUTH EVERY DAY AFTER SUPPER  . glipiZIDE (GLUCOTROL) 10 MG tablet Take 20 mg by mouth 2 (two) times daily before a meal.   . guaiFENesin (MUCINEX) 600 MG 12 hr tablet Take 600 mg by mouth 2 (two) times daily as needed for cough.  . insulin NPH (HUMULIN N,NOVOLIN N) 100 UNIT/ML injection Inject 0-20 Units into the skin See admin instructions. Novolin-N Give 20 unit in morning; give  5 units at 4pm; evening dose as needed.  If less than 150 blood sugar= 0units.  If greater than 200 blood sugar= give 10units to decrease by 100 blood sugar  . insulin regular (NOVOLIN R,HUMULIN R) 100 units/mL injection Inject 0-25 Units into the skin See admin instructions. Give 25 unit in morning; give 20units at 4pm; evening dose as needed.  If less than 150 blood sugar= 0units.  If greater than 200 blood sugar= give 10units to decrease by 100 blood sugar  . levothyroxine (SYNTHROID) 100 MCG tablet Take 100 mcg by mouth daily before breakfast.  . lidocaine (LIDODERM) 5 % Place 3 patches onto the skin daily as needed (pain). Remove & Discard patch within 12 hours  or as directed by MD  . linaclotide (LINZESS) 290 MCG CAPS capsule Take 290 mcg by mouth daily before breakfast.  . LORazepam (ATIVAN) 0.5 MG tablet Take 0.5 mg by mouth daily as needed for anxiety.  . Menthol-Methyl Salicylate (MUSCLE RUB) 10-15 % CREA Apply 1 application topically as needed for muscle pain.  . midodrine (PROAMATINE) 2.5 MG tablet Take 1 tablet (2.5 mg total) by mouth 3 (three) times daily with meals. (Patient taking differently: Take 2.5 mg by mouth as needed. As needed if blood pressure goes to low.)  . nortriptyline (PAMELOR) 50 MG capsule TAKE 2 CAPSULES (100MG ) AT BEDTIME (Patient taking differently: Take 50 mg by mouth 2 (two) times daily.)  . ondansetron (ZOFRAN) 4 MG tablet Take 4 mg by mouth as needed for nausea or vomiting.   . OXYGEN Inhale 2 L into the lungs as needed. Used at overnight with a concentrator  . pantoprazole (PROTONIX) 40 MG tablet Take 1 tablet (40 mg total) by mouth daily.  . polyethylene glycol (MIRALAX / GLYCOLAX) packet Take 17 g by mouth daily as needed for mild constipation.   . potassium chloride SA (K-DUR,KLOR-CON) 20 MEQ tablet Take 1 tablet (20 mEq total) by mouth 2 (two) times daily.  . Probiotic Product (ALIGN) 4 MG CAPS Take 1 capsule by mouth daily.  . propranolol (INDERAL) 60 MG tablet 1 tablet  . Pumpkin Seed-Soy Germ (AZO BLADDER CONTROL/GO-LESS PO) Take 1 tablet by mouth daily as needed (bladder control).  . rosuvastatin (CRESTOR) 20 MG tablet Take 20 mg by mouth daily.   . solifenacin (VESICARE) 5 MG tablet Take 5 mg by mouth daily.  Marland Kitchen terconazole (TERAZOL 7) 0.4 % vaginal cream Place 1 applicator vaginally daily as needed for itching.  . [DISCONTINUED] furosemide (LASIX) 20 MG tablet TAKE 1 TABLET (20 MG TOTAL) BY MOUTH DAILY AS NEEDED FOR FLUID OR EDEMA.    Allergies  Allergen Reactions  . Iodine Other (See Comments)    ONLY IV--Makes unconscious  . Iohexol Other (See Comments)     Code: SOB, Desc: cardiac arrest w/ iv  contrast, has never used 13 hr prep//alice calhoun, Onset Date: 11914782   . Metoclopramide Hcl Other (See Comments)    suicidal  . Sulfa Antibiotics Hives  . Lactose Intolerance (Gi) Diarrhea  . Lansoprazole Nausea And Vomiting    SOCIAL HISTORY/FAMILY HISTORY   Reviewed in Epic:  Pertinent findings: no change  OBJCTIVE -PE, EKG, labs   Wt Readings from Last 3 Encounters:  01/21/20 261 lb (118.4 kg)  12/26/19 231 lb (104.8 kg)  10/31/19 261 lb 3.2 oz (118.5 kg)  06/19/2019: 246 lb  Physical Exam: BP 130/72   Pulse (!) 59   Ht 5\' 7"  (1.702 m)   Wt 261 lb (118.4 kg)   SpO2 94%   BMI 40.88 kg/m  Physical Exam Vitals reviewed.  Constitutional:      General: She is not in acute distress.    Appearance: She is ill-appearing (Chronically ill, debilitated). She is not toxic-appearing or diaphoretic.     Comments: Morbidly obese.  Well-groomed.  Sitting in wheelchair.  HENT:     Head: Normocephalic and atraumatic.  Neck:     Vascular: No carotid bruit or JVD.  Cardiovascular:     Rate and Rhythm: Normal rate and regular rhythm. Occasional extrasystoles are present.    Chest Wall: PMI is not displaced (Unable to assess).     Pulses: Decreased pulses (Decreased with palpable pedal pulses).     Heart sounds: S1 normal and S2 normal. Heart sounds are distant. No murmur heard. No friction rub. No gallop.   Pulmonary:     Effort: Pulmonary effort is normal. No respiratory distress.     Breath sounds: Normal breath sounds.  Chest:     Chest wall: Tenderness (Costochondral/parasternal) present.  Musculoskeletal:     Cervical back: Normal range of motion and neck supple.     Right lower leg: Edema present.     Left lower leg: Edema (1-2+ bilateral LE edema.) present.  Skin:    General: Skin is warm and dry.  Neurological:     General: No focal deficit present.     Mental Status: She is oriented to person, place, and time. Mental status is at baseline.  Psychiatric:         Mood and Affect: Mood normal.        Behavior: Behavior normal.        Thought Content: Thought content normal.        Judgment: Judgment normal.      Adult ECG Report  n/a  Recent Labs:  Reviewed (Epic & KPN)  04/25/2019: TC 145, TG 120, HDL 71 LDL 50.  08/27/2019: A1c 8.6, Cr 1.59  07/19/2019: Hgb 12.3  Lab Results  Component Value Date   CREATININE 1.42 (H) 07/31/2019   BUN 19 07/31/2019   NA 136 07/31/2019   K 4.0 07/31/2019   CL 101 07/31/2019   CO2 29 07/31/2019   Lab Results  Component Value Date   TSH 4.194 09/12/2017    ASSESSMENT/PLAN    Problem List Items Addressed This Visit    Pacemaker (Chronic)    Followed by EP.      Syncope due to orthostatic hypotension (Chronic)    No further episodes since antibiotics were discontinued with exception of propranolol.  Allow permissive hypertension Adequate hydration and avoiding dehydration with diuretics. Continue midodrine      Hyperlipidemia with target LDL less than 130 (Chronic)    As of March, lipids look great.   She tried stopping rosuvastatin, but it did make a difference with her fatigue and weakness.      Relevant Medications   propranolol (INDERAL) 60 MG tablet   furosemide (LASIX) 20 MG tablet   Essential hypertension (Chronic)    Blood pressure looks pretty good.  We are allowing for mild permissive hypertension.  She is now on midodrine to try to keep her blood pressure up.   She is on Inderal for tremor/headache and rate control.      Relevant Medications   propranolol (INDERAL) 60 MG tablet   furosemide (LASIX) 20 MG tablet   Sleep apnea (Chronic)    On nighttime oxygen.  Not sure if she is still using CPAP.      Edema of both legs (Chronic)  We discussed doing leg exercises to avoid clots.  Increasing Lasix dosing (see patient instructions) Support stockings possible, otherwise with the Ace wraps. Foot elevation      Chronic diastolic heart failure (HCC) (Chronic)    She  clearly has diastolic dysfunction, but I do not know how much of her symptoms are truly related to diastolic heart failure.  Edema seems to be more like the venous stasis and OHS.  No longer on afterload reduction because of issues with orthostatic hypotension and falls. Only on Inderal which is not necessarily for CHF or hypertension.  We talked about sliding scale Lasix for worsening edema. ->  She is currently taking every other day Lasix.  Colodonato weight goes back down and edema goes back down to baseline, to take daily Lasix with an extra dose today.      Relevant Medications   propranolol (INDERAL) 60 MG tablet   furosemide (LASIX) 20 MG tablet   Sinus node dysfunction (HCC) (Chronic)    Syncope after pacemaker.  Followed by EP  No arrhythmias noted on pacemaker interrogation.      Relevant Medications   propranolol (INDERAL) 60 MG tablet   furosemide (LASIX) 20 MG tablet       COVID-19 Education: The signs and symptoms of COVID-19 were discussed with the patient and how to seek care for testing (follow up with PCP or arrange E-visit).   The importance of social distancing and COVID-19 vaccination was discussed today. 1 min The patient is practicing social distancing & Masking.   I spent a total of 63minutes with the patient spent in direct patient consultation.  Additional time spent with chart review  / charting (studies, outside notes, etc): 9 Total Time: 41 min   Current medicines are reviewed at length with the patient today.  (+/- concerns) n/a  This visit occurred during the SARS-CoV-2 public health emergency.  Safety protocols were in place, including screening questions prior to the visit, additional usage of staff PPE, and extensive cleaning of exam room while observing appropriate contact time as indicated for disinfecting solutions.  Notice: This dictation was prepared with Dragon dictation along with smaller phrase technology. Any transcriptional errors  that result from this process are unintentional and may not be corrected upon review.  Patient Instructions / Medication Changes & Studies & Tests Ordered   Patient Instructions  Medication Instructions:    Take 20 mg of Lasix  Every day until your swelling is done. Weigh your daily - your weight  at this time will be your Dry weight  This weight the weight you should be at most of the time,  if you ar 3 lbs up over night - take 20 mg daily until  You are back to dry weight. If you are 5 lbs over then take 2 tablets of 20 mg Lasix the first day ,ten 20 mg each day until you get your dry weight.   if every day is keeping you at your dry weight  You can change to taking lasix at least 5 days week.  *If you need a refill on your cardiac medications before your next appointment, please call your pharmacy*   Lab Work: Not needed   Testing/Procedures: Not needed   Follow-Up: At [facility], you and your health needs are our priority.  As part of our continuing mission to provide you with exceptional heart care, we have created designated Provider Care Teams.  These Care Teams include your primary Cardiologist (physician) and  Advanced Practice Providers (APPs -  Physician Assistants and Nurse Practitioners) who all work together to provide you with the care you need, when you need it.  .    Your next appointment:   12 month(s)  The format for your next appointment:   In Person  Provider:   Glenetta Hew, MD  Studies Ordered:   No orders of the defined types were placed in this encounter.    Glenetta Hew, M.D., M.S. Interventional Cardiologist   Pager # [phone] Phone # [phone] [address] [place], [place] [address]   Thank you for choosing [facility] at [facility]!!

## 2020-01-21 NOTE — Patient Instructions (Signed)
Medication Instructions:    Take 20 mg of Lasix  Every day until your swelling is done. Weigh your daily - your weight  at this time will be your Dry weight  This weight the weight you should be at most of the time,  if you ar 3 lbs up over night - take 20 mg daily until  You are back to dry weight. If you are 5 lbs over then take 2 tablets of 20 mg Lasix the first day ,ten 20 mg each day until you get your dry weight.   if every day is keeping you at your dry weight  You can change to taking lasix at least 5 days week.  *If you need a refill on your cardiac medications before your next appointment, please call your pharmacy*   Lab Work: Not needed   Testing/Procedures: Not needed   Follow-Up: At [facility], you and your health needs are our priority.  As part of our continuing mission to provide you with exceptional heart care, we have created designated Provider Care Teams.  These Care Teams include your primary Cardiologist (physician) and Advanced Practice Providers (APPs -  Physician Assistants and Nurse Practitioners) who all work together to provide you with the care you need, when you need it.  .    Your next appointment:   12 month(s)  The format for your next appointment:   In Person  Provider:   Glenetta Hew, MD

## 2020-01-30 ENCOUNTER — Ambulatory Visit: Payer: Medicare HMO | Admitting: Internal Medicine

## 2020-02-05 ENCOUNTER — Encounter: Payer: Self-pay | Admitting: Cardiology

## 2020-02-05 NOTE — Assessment & Plan Note (Signed)
On nighttime oxygen.  Not sure if she is still using CPAP.

## 2020-02-05 NOTE — Assessment & Plan Note (Signed)
Blood pressure looks pretty good.  We are allowing for mild permissive hypertension.  She is now on midodrine to try to keep her blood pressure up.   She is on Inderal for tremor/headache and rate control.

## 2020-02-05 NOTE — Assessment & Plan Note (Signed)
As of March, lipids look great.   She tried stopping rosuvastatin, but it did make a difference with her fatigue and weakness.

## 2020-02-05 NOTE — Assessment & Plan Note (Signed)
No further episodes since antibiotics were discontinued with exception of propranolol.  Allow permissive hypertension Adequate hydration and avoiding dehydration with diuretics. Continue midodrine

## 2020-02-05 NOTE — Assessment & Plan Note (Signed)
Syncope after pacemaker.  Followed by EP  No arrhythmias noted on pacemaker interrogation.

## 2020-02-05 NOTE — Assessment & Plan Note (Signed)
She clearly has diastolic dysfunction, but I do not know how much of her symptoms are truly related to diastolic heart failure.  Edema seems to be more like the venous stasis and OHS.  No longer on afterload reduction because of issues with orthostatic hypotension and falls. Only on Inderal which is not necessarily for CHF or hypertension.  We talked about sliding scale Lasix for worsening edema. ->  She is currently taking every other day Lasix.  [place] weight goes back down and edema goes back down to baseline, to take daily Lasix with an extra dose today.

## 2020-02-05 NOTE — Assessment & Plan Note (Signed)
Followed by EP 

## 2020-02-05 NOTE — Assessment & Plan Note (Signed)
We discussed doing leg exercises to avoid clots.  Increasing Lasix dosing (see patient instructions) Support stockings possible, otherwise with the Ace wraps. Foot elevation

## 2020-02-11 ENCOUNTER — Ambulatory Visit: Payer: Medicare HMO | Admitting: Internal Medicine

## 2020-02-25 ENCOUNTER — Ambulatory Visit: Payer: Medicare HMO | Admitting: Primary Care

## 2020-02-28 ENCOUNTER — Encounter (INDEPENDENT_AMBULATORY_CARE_PROVIDER_SITE_OTHER): Payer: Medicare HMO | Admitting: Ophthalmology

## 2020-03-10 ENCOUNTER — Other Ambulatory Visit: Payer: Self-pay

## 2020-03-10 ENCOUNTER — Ambulatory Visit: Payer: Medicare HMO | Admitting: Primary Care

## 2020-03-10 ENCOUNTER — Encounter: Payer: Self-pay | Admitting: Primary Care

## 2020-03-10 DIAGNOSIS — J310 Chronic rhinitis: Secondary | ICD-10-CM

## 2020-03-10 DIAGNOSIS — J9611 Chronic respiratory failure with hypoxia: Secondary | ICD-10-CM

## 2020-03-10 DIAGNOSIS — J45991 Cough variant asthma: Secondary | ICD-10-CM | POA: Diagnosis not present

## 2020-03-10 MED ORDER — AFRIN NASAL SPRAY 0.05 % NA SOLN: 1.0000 | Freq: Two times a day (BID) | NASAL | 0 refills | Status: DC | PRN

## 2020-03-10 NOTE — Patient Instructions (Addendum)
Recommendations: - Continue Symbicort 1-2 puffs  - Try afrin nasal spray x 3 days for rhinitis   Follow-up - 6-9 months with Dr. Melvyn Novas or sooner if needed

## 2020-03-10 NOTE — Progress Notes (Signed)
@Patient  ID: Tara Jackson, female    DOB: 10-24-1940, 80 y.o.   MRN: [id number]  Chief Complaint  Patient presents with  . Follow-up    No complaints    Referring provider: Shon Baton, MD  HPI: 80 year old female, never smoked.  Past medical history significant for cough variant asthma, chronic respiratory failure with hypoxia, sleep apnea, allergic rhinitis, chronic diastolic heart failure, hypertension, syncope, chronic kidney disease stage III, diabetes mellitus type 2.  Patient of Dr. Melvyn Novas.   Previous [facility] pulmonary encounters: 07/31/2019  Patient presents today for regular follow-up. She is doing ok, states that her cough is some better. She is still experiencing post nasal drip symptoms during the day. Her cough is worse at night.  She is having some memory issues, recently started Aricept. She takes clonazepam 0.5mg  twice a day and has only used ativan once this past year. She is requesting a refill of medication, they were prescribed by Dr. Virgina Jock and she will contact his office for refill.   10/31/2019 Patient presents today for 61-month follow-up cough variant asthma/sleep apnea/chronic respiratory failure.  During last visit patient reported that her cough was some better but continues to be significant at night.  Patient was recommended to take prednisone as instructed; 2 tablets daily until better then decrease to 1 tablet every 5 days then decrease to half tablet daily.  Advised her to take as needed Lasix 1-2 times over that particular week due to swelling/edema.  BNP was normal.  She is feeling better. She uses her Albuterol rescue inhaler twice a week.  Currently on 1/2 tab prednisone daily and planning on tapering off. Her blood glucose levels have been elevated. Breathing is more limited by her walking d/t legs. She is following with neurology for memory difficulties. Husbands goal is for her to come off some of her medications.   03/10/2020- Interim hx  Patient presents  today for 4 month follow-up cough variant asthma. She feels she is back to her baseline. No acute complaints. States that her breathing has been fine. She does not have a cough. She does have post nasal drip symptoms which started 3 days ago. She is compliant with Symbicort 80, she takes 2 puffs once a day. She does not require Albuterol rescue inhaler. She wears oxygen at night as needed.   Imaging : 07/19/19 CXR: Enlargement of cardiac silhouette with pulmonary vascular congestion post pacemaker.Bronchitic changes without infiltrate.  Allergies  Allergen Reactions  . Iodine Other (See Comments)    ONLY IV--Makes unconscious  . Iohexol Other (See Comments)     Code: SOB, Desc: cardiac arrest w/ iv contrast, has never used 13 hr prep//alice calhoun, Onset Date: [id number]   . Metoclopramide Hcl Other (See Comments)    suicidal  . Sulfa Antibiotics Hives  . Lactose Intolerance (Gi) Diarrhea  . Lansoprazole Nausea And Vomiting    Immunization History  Administered Date(s) Administered  . Influenza Whole 10/22/2009  . Influenza, High Dose Seasonal PF 10/24/2016, 11/07/2017, 10/22/2019  . Influenza-Unspecified 12/09/2014, 11/08/2018  . PFIZER(Purple Top)SARS-COV-2 Vaccination 03/12/2019, 04/02/2019, 11/21/2019  . Pneumococcal Polysaccharide-23 02/07/2005    Past Medical History:  Diagnosis Date  . Anemia   . Anxiety   . Arthritis   . Asthma   . Brittle bone disease   . CKD (chronic kidney disease)   . Complication of anesthesia    hard to wake up after anesthesia, trouble turning head  . Coronary artery disease, non-occlusive 2014   a. minimal  CAD by cath in 2014 with 40% RI stenosis. b. low-risk NST in 11/2014.  Marland Kitchen Depression   . Diabetes mellitus type 2, insulin dependent ([place])   . Diabetic neuropathy ([place])   . Diabetic retinopathy ([place])    NPDR OU  . Diastolic dysfunction, left ventricle 11/30/14   EF 55%, grade 1 DD  . DVT (deep venous thrombosis) ([place])    "18 in RLE; 7  LLE prior to PE" (03/24/2015)  . Family history of adverse reaction to anesthesia    " the whole family is hard to wake up"  . Glaucoma   . H/O hiatal hernia   . Hyperlipidemia   . Hypertension associated with diabetes ([place])   . Hypertensive retinopathy    OU  . Hypothyroidism   . Kidney stones    "passed them"  . Macular degeneration    Exu OU  . Memory loss 06/03/2014  . On home oxygen therapy    "2L oxygen concentrator @ night"  . Osteoporosis   . Oxygen desaturation during sleep    wears 2 liters of oxygen at night   . Palpitations 35years ago   Cardionet monitor - revealed mostly normal sinus rhythm, sinus bradycardia with first-degree A-V block heart rates mostly in the 50s and 60s with some 70s. No arrhythmias, PVCs or PACs noted.  . PE (pulmonary thromboembolism) ([place]) x 3, last one was ~ 2003   history of recurrent RLE DVT with PE - last PE ~>13 yrs; Maintained on Plavix  . Pneumonia   . Presence of permanent cardiac pacemaker   . Restless legs syndrome   . Sleep apnea    cpap disontinued; test done 07/14/2008 ordered per  Byrum  . Spinal headache   . Spinal stenosis    L 3, L 4 and L 5, and C 1, C 2 and C3  . Stroke [facility] 04-19-2013   tia and stroke. Weakness Rt hand  . 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 s/p [employer] PPM in 03/2015 by Dr. Lovena Le secondary to sinus node dysfunction.   Marland Kitchen TIA (transient ischemic attack) March 2014    Tobacco History: Social History   Tobacco Use  Smoking Status Passive Smoke Exposure - Never Smoker  Smokeless Tobacco Never Used   Counseling given: Not Answered   Outpatient Medications Prior to Visit  Medication Sig Dispense Refill  . acetaminophen (TYLENOL) 650 MG CR tablet Take 650 mg by mouth every 8 (eight) hours as needed for pain.    Marland Kitchen albuterol (VENTOLIN HFA) 108 (90 Base) MCG/ACT inhaler INHALE 2 INHALATIONS 3 TIMES A DAY AS NEEDED FOR COUGH/WHEEZE 18 each PRN  . busPIRone (BUSPAR) 10 MG tablet Take 1  tablet (10 mg total) by mouth every morning. 60 tablet 1  . Cholecalciferol (VITAMIN D-3 PO) Take 2 tablets by mouth every morning.     . clonazePAM (KLONOPIN) 0.5 MG tablet Take 0.5-1 mg by mouth See admin instructions. Takes 1mg  in am and 0.5mg  in pm.    . clopidogrel (PLAVIX) 75 MG tablet Take 75 mg by mouth every morning.    . cyanocobalamin 100 MCG tablet Take 100 mcg by mouth daily.    Marland Kitchen dicyclomine (BENTYL) 10 MG capsule Take 1 capsule (10 mg total) by mouth 3 (three) times daily before meals. (Patient taking differently: Take 10 mg by mouth in the morning and at bedtime.)    . donepezil (ARICEPT) 10 MG tablet Take 1 tablet (10 mg total) by mouth at bedtime. [place]  tablet 3  . famotidine (PEPCID) 20 MG tablet TAKE 1 TABLET BY MOUTH EVERY DAY AFTER SUPPER 90 tablet 3  . furosemide (LASIX) 20 MG tablet Take 1 tablet (20 mg total) by mouth daily as needed for fluid or edema. As directed 90 tablet 2  . glipiZIDE (GLUCOTROL) 10 MG tablet Take 20 mg by mouth 2 (two) times daily before a meal.     . guaiFENesin (MUCINEX) 600 MG 12 hr tablet Take 600 mg by mouth 2 (two) times daily as needed for cough.    . insulin NPH (HUMULIN N,NOVOLIN N) 100 UNIT/ML injection Inject 0-20 Units into the skin See admin instructions. Novolin-N Give 20 unit in morning; give  5 units at 4pm; evening dose as needed.  If less than 150 blood sugar= 0units.  If greater than 200 blood sugar= give 10units to decrease by 100 blood sugar    . insulin regular (NOVOLIN R,HUMULIN R) 100 units/mL injection Inject 0-25 Units into the skin See admin instructions. Give 25 unit in morning; give 20units at 4pm; evening dose as needed.  If less than 150 blood sugar= 0units.  If greater than 200 blood sugar= give 10units to decrease by 100 blood sugar    . levothyroxine (SYNTHROID) 100 MCG tablet Take 100 mcg by mouth daily before breakfast.    . lidocaine (LIDODERM) 5 % Place 3 patches onto the skin daily as needed (pain). Remove & Discard  patch within 12 hours or as directed by MD    . linaclotide (LINZESS) 290 MCG CAPS capsule Take 290 mcg by mouth daily before breakfast.    . LORazepam (ATIVAN) 0.5 MG tablet Take 0.5 mg by mouth daily as needed for anxiety.    . Menthol-Methyl Salicylate (MUSCLE RUB) 10-15 % CREA Apply 1 application topically as needed for muscle pain.    . midodrine (PROAMATINE) 2.5 MG tablet Take 1 tablet (2.5 mg total) by mouth 3 (three) times daily with meals. (Patient taking differently: Take 2.5 mg by mouth as needed. As needed if blood pressure goes to low.) 90 tablet 6  . nortriptyline (PAMELOR) 50 MG capsule TAKE 2 CAPSULES (100MG ) AT BEDTIME (Patient taking differently: Take 50 mg by mouth 2 (two) times daily.) 180 capsule 1  . ondansetron (ZOFRAN) 4 MG tablet Take 4 mg by mouth as needed for nausea or vomiting.   0  . OXYGEN Inhale 2 L into the lungs as needed. Used at overnight with a concentrator    . pantoprazole (PROTONIX) 40 MG tablet Take 1 tablet (40 mg total) by mouth daily. 90 tablet 3  . polyethylene glycol (MIRALAX / GLYCOLAX) packet Take 17 g by mouth daily as needed for mild constipation.     . potassium chloride SA (K-DUR,KLOR-CON) 20 MEQ tablet Take 1 tablet (20 mEq total) by mouth 2 (two) times daily. 10 tablet 0  . Probiotic Product (ALIGN) 4 MG CAPS Take 1 capsule by mouth daily.    . propranolol (INDERAL) 60 MG tablet 1 tablet    . Pumpkin Seed-Soy Germ (AZO BLADDER CONTROL/GO-LESS PO) Take 1 tablet by mouth daily as needed (bladder control).    . rosuvastatin (CRESTOR) 20 MG tablet Take 20 mg by mouth daily.     . solifenacin (VESICARE) 5 MG tablet Take 5 mg by mouth daily.    Marland Kitchen terconazole (TERAZOL 7) 0.4 % vaginal cream Place 1 applicator vaginally daily as needed for itching.    . budesonide-formoterol (SYMBICORT) 80-4.5 MCG/ACT inhaler Inhale 2 puffs  into the lungs 2 (two) times daily. (Patient not taking: Reported on 03/10/2020)     No facility-administered medications prior to  visit.   Review of Systems  Review of Systems  HENT: Positive for postnasal drip.   Respiratory: Negative for cough, chest tightness, shortness of breath and wheezing.    Physical Exam  BP (!) 148/84 (BP Location: Left Arm, Cuff Size: Large)   Pulse 65   Temp 97.7 F (36.5 C) (Oral)   SpO2 93%  Physical Exam Constitutional:      Appearance: Normal appearance.  HENT:     Head: Normocephalic and atraumatic.  Cardiovascular:     Rate and Rhythm: Normal rate and regular rhythm.     Comments: +1 BLE edema Pulmonary:     Effort: Pulmonary effort is normal.     Breath sounds: Normal breath sounds. No wheezing or rhonchi.  Musculoskeletal:        General: Normal range of motion.  Neurological:     General: No focal deficit present.     Mental Status: She is alert and oriented to person, place, and time. Mental status is at baseline.  Psychiatric:        Mood and Affect: Mood normal.        Behavior: Behavior normal.        Thought Content: Thought content normal.        Judgment: Judgment normal.      Lab Results:  CBC    Component Value Date/Time   WBC 6.6 07/19/2019 0929   RBC 3.82 (L) 07/19/2019 0929   HGB 12.3 07/19/2019 0929   HCT 38.3 07/19/2019 0929   PLT 237 07/19/2019 0929   MCV 100.3 (H) 07/19/2019 0929   MCH 32.2 07/19/2019 0929   MCHC 32.1 07/19/2019 0929   RDW 11.9 07/19/2019 0929   LYMPHSABS 1.2 03/19/2019 1311   MONOABS 0.2 03/19/2019 1311   EOSABS 0.1 03/19/2019 1311   BASOSABS 0.1 03/19/2019 1311    BMET    Component Value Date/Time   NA 136 07/31/2019 1226   K 4.0 07/31/2019 1226   CL 101 07/31/2019 1226   CO2 29 07/31/2019 1226   GLUCOSE 249 (H) 07/31/2019 1226   BUN 19 07/31/2019 1226   CREATININE 1.42 (H) 07/31/2019 1226   CREATININE 1.64 (H) 04/09/2015 1438   CALCIUM 9.1 07/31/2019 1226   GFRNONAA 27 (L) 07/19/2019 0929   GFRAA 32 (L) 07/19/2019 0929    BNP    Component Value Date/Time   BNP 28.1 03/22/2019 1502     ProBNP    Component Value Date/Time   PROBNP 30.0 07/31/2019 1226    Imaging: No results found.   Assessment & Plan:   Cough variant asthma vs uacs  - Symptoms seem to have calmed down. She does not currently have a cough or dyspnea. No SABA use.  - Continue Symbicort 69mcg 1-2 puffs twice daily   Rhinitis - RX afrin nasal spray 1 puff per nostril daily as needed (do not use more than 3 consecutive days in a row)  Chronic respiratory failure with hypoxia (HCC) - Minimal oxygen use now; wears prn at night    Martyn Ehrich, NP 03/10/2020

## 2020-03-10 NOTE — Assessment & Plan Note (Signed)
-   Minimal oxygen use now; wears prn at night

## 2020-03-10 NOTE — Assessment & Plan Note (Addendum)
-   Symptoms seem to have calmed down. She does not currently have a cough or dyspnea. No SABA use.  - Continue Symbicort 23mcg 1-2 puffs twice daily

## 2020-03-10 NOTE — Assessment & Plan Note (Addendum)
-   RX afrin nasal spray 1 puff per nostril daily as needed (do not use more than 3 consecutive days in a row)

## 2020-03-19 ENCOUNTER — Ambulatory Visit (INDEPENDENT_AMBULATORY_CARE_PROVIDER_SITE_OTHER): Payer: Medicare HMO

## 2020-03-19 DIAGNOSIS — I495 Sick sinus syndrome: Secondary | ICD-10-CM | POA: Diagnosis not present

## 2020-03-19 LAB — CUP PACEART REMOTE DEVICE CHECK
Battery Remaining Longevity: 108 mo
Battery Remaining Percentage: 100 %
Brady Statistic RA Percent Paced: 84 %
Brady Statistic RV Percent Paced: 68 %
Date Time Interrogation Session: 20220210030100
Implantable Lead Implant Date: 20170214
Implantable Lead Implant Date: 20170214
Implantable Lead Location: 753859
Implantable Lead Location: 753860
Implantable Lead Model: 7740
Implantable Lead Model: 7741
Implantable Lead Serial Number: 649859
Implantable Lead Serial Number: 728741
Implantable Pulse Generator Implant Date: 20170214
Lead Channel Impedance Value: 668 Ohm
Lead Channel Impedance Value: 741 Ohm
Lead Channel Pacing Threshold Amplitude: 0.5 V
Lead Channel Pacing Threshold Amplitude: 1.3 V
Lead Channel Pacing Threshold Pulse Width: 0.4 ms
Lead Channel Pacing Threshold Pulse Width: 0.4 ms
Lead Channel Setting Pacing Amplitude: 1.7 V
Lead Channel Setting Pacing Amplitude: 2 V
Lead Channel Setting Pacing Pulse Width: 0.4 ms
Lead Channel Setting Sensing Sensitivity: 2.5 mV
Pulse Gen Serial Number: 718098

## 2020-03-26 NOTE — Progress Notes (Signed)
Remote pacemaker transmission.   

## 2020-03-30 NOTE — Progress Notes (Signed)
[facility] Note  04/03/2020     CHIEF COMPLAINT Patient presents for Retina Follow Up   HISTORY OF PRESENT ILLNESS: Tara Jackson is a 80 y.o. female who presents to the clinic today for:   HPI    Retina Follow Up    Patient presents with  Dry AMD.  In both eyes.  This started years ago.  Severity is moderate.  Duration of 7 months.  Since onset it is gradually improving.  I, the attending physician,  performed the HPI with the patient and updated documentation appropriately.          Comments    80 y/o female pt here for 7 mo f/u for exu ARMD w/PED OU.  No change in [place] OU.  Denies pain, FOL, floaters.  Systane prn OU.  BS last night was 149.  A1C unknown.  Still c/o constant horizontal diplopia in OS only.       Last edited by Bernarda Caffey, MD on 04/03/2020  3:42 PM. (History)    pt states she has not seen Dr. Herbert Deaner since she was here last, she states she is still seeing double vision out of her left eye temporal side only, no new health complaints  Referring physician: Shon Baton, MD [place],  [place] [id number]  HISTORICAL INFORMATION:   Selected notes from the MEDICAL RECORD NUMBER Referred by Dr. Monna Fam for concern of unusual large drusenoid, epi detachement LEE: 11.11.19 (K. Hecker) [BCVA: OD: 20/25++ OS: 20/20--] Ocular Hx-cataracts OU, glaucoma OU PMH-anemia, anxiety, arthritis, asthma, CKD, CAD, depression, DM(takes Novolin and glipizide), HLD, HTN,stroke    CURRENT MEDICATIONS: No current outpatient medications on file. (Ophthalmic Drugs)   No current facility-administered medications for this visit. (Ophthalmic Drugs)   Current Outpatient Medications (Other)  Medication Sig  . acetaminophen (TYLENOL) 650 MG CR tablet Take 650 mg by mouth every 8 (eight) hours as needed for pain.  Marland Kitchen albuterol (VENTOLIN HFA) 108 (90 Base) MCG/ACT inhaler INHALE 2 INHALATIONS 3 TIMES A DAY AS NEEDED FOR COUGH/WHEEZE  .  budesonide-formoterol (SYMBICORT) 80-4.5 MCG/ACT inhaler Inhale 2 puffs into the lungs 2 (two) times daily.  . busPIRone (BUSPAR) 10 MG tablet Take 1 tablet (10 mg total) by mouth every morning.  . Cholecalciferol (VITAMIN D-3 PO) Take 2 tablets by mouth every morning.   . clonazePAM (KLONOPIN) 0.5 MG tablet Take 0.5-1 mg by mouth See admin instructions. Takes 1mg  in am and 0.5mg  in pm.  . clopidogrel (PLAVIX) 75 MG tablet Take 75 mg by mouth every morning.  . cyanocobalamin 100 MCG tablet Take 100 mcg by mouth daily.  Marland Kitchen dicyclomine (BENTYL) 10 MG capsule Take 1 capsule (10 mg total) by mouth 3 (three) times daily before meals. (Patient taking differently: Take 10 mg by mouth in the morning and at bedtime.)  . donepezil (ARICEPT) 10 MG tablet Take 1 tablet (10 mg total) by mouth at bedtime.  Marland Kitchen esomeprazole (NEXIUM) 40 MG capsule Take 40 mg by mouth daily.  . famotidine (PEPCID) 20 MG tablet TAKE 1 TABLET BY MOUTH EVERY DAY AFTER SUPPER  . furosemide (LASIX) 20 MG tablet Take 1 tablet (20 mg total) by mouth daily as needed for fluid or edema. As directed  . glipiZIDE (GLUCOTROL) 10 MG tablet Take 20 mg by mouth 2 (two) times daily before a meal.   . guaiFENesin (MUCINEX) 600 MG 12 hr tablet Take 600 mg by mouth 2 (two) times daily as needed  for cough.  . insulin NPH (HUMULIN N,NOVOLIN N) 100 UNIT/ML injection Inject 0-20 Units into the skin See admin instructions. Novolin-N Give 20 unit in morning; give  5 units at 4pm; evening dose as needed.  If less than 150 blood sugar= 0units.  If greater than 200 blood sugar= give 10units to decrease by 100 blood sugar  . levothyroxine (SYNTHROID) 100 MCG tablet Take 100 mcg by mouth daily before breakfast.  . lidocaine (LIDODERM) 5 % Place 3 patches onto the skin daily as needed (pain). Remove & Discard patch within 12 hours or as directed by MD  . linaclotide (LINZESS) 290 MCG CAPS capsule Take 290 mcg by mouth daily before breakfast.  . LORazepam (ATIVAN)  0.5 MG tablet Take 0.5 mg by mouth daily as needed for anxiety.  . Menthol-Methyl Salicylate (MUSCLE RUB) 10-15 % CREA Apply 1 application topically as needed for muscle pain.  . midodrine (PROAMATINE) 2.5 MG tablet Take 1 tablet (2.5 mg total) by mouth 3 (three) times daily with meals. (Patient taking differently: Take 2.5 mg by mouth as needed. As needed if blood pressure goes to low.)  . nortriptyline (PAMELOR) 50 MG capsule TAKE 2 CAPSULES (100MG ) AT BEDTIME (Patient taking differently: Take 50 mg by mouth 2 (two) times daily.)  . ondansetron (ZOFRAN) 4 MG tablet Take 4 mg by mouth as needed for nausea or vomiting.   . OXYGEN Inhale 2 L into the lungs as needed. Used at overnight with a concentrator  . oxymetazoline (AFRIN NASAL SPRAY) 0.05 % nasal spray Place 1 spray into both nostrils 2 (two) times daily as needed for congestion.  . pantoprazole (PROTONIX) 40 MG tablet Take 1 tablet (40 mg total) by mouth daily.  . polyethylene glycol (MIRALAX / GLYCOLAX) packet Take 17 g by mouth daily as needed for mild constipation.   . potassium chloride SA (K-DUR,KLOR-CON) 20 MEQ tablet Take 1 tablet (20 mEq total) by mouth 2 (two) times daily.  . Probiotic Product (ALIGN) 4 MG CAPS Take 1 capsule by mouth daily.  . propranolol (INDERAL) 60 MG tablet 1 tablet  . Pumpkin Seed-Soy Germ (AZO BLADDER CONTROL/GO-LESS PO) Take 1 tablet by mouth daily as needed (bladder control).  . rosuvastatin (CRESTOR) 20 MG tablet Take 20 mg by mouth daily.   . solifenacin (VESICARE) 5 MG tablet Take 5 mg by mouth daily.  Marland Kitchen terconazole (TERAZOL 7) 0.4 % vaginal cream Place 1 applicator vaginally daily as needed for itching.  . insulin regular (NOVOLIN R,HUMULIN R) 100 units/mL injection Inject 0-25 Units into the skin See admin instructions. Give 25 unit in morning; give 20units at 4pm; evening dose as needed.  If less than 150 blood sugar= 0units.  If greater than 200 blood sugar= give 10units to decrease by 100 blood sugar  (Patient not taking: Reported on 04/03/2020)   No current facility-administered medications for this visit. (Other)      REVIEW OF SYSTEMS: ROS    Positive for: Neurological, Genitourinary, Endocrine, Cardiovascular, Eyes, Respiratory   Negative for: Constitutional, Gastrointestinal, Skin, Musculoskeletal, HENT, Psychiatric, Allergic/Imm, Heme/Lymph   Last edited by Matthew Folks, COA on 04/03/2020  1:39 PM. (History)       ALLERGIES Allergies  Allergen Reactions  . Iodine Other (See Comments)    ONLY IV--Makes unconscious  . Iohexol Other (See Comments)     Code: SOB, Desc: cardiac arrest w/ iv contrast, has never used 13 hr prep//alice calhoun, Onset Date: [id number]   . Metoclopramide Hcl Other (See  Comments)    suicidal  . Sulfa Antibiotics Hives  . Lactose Intolerance (Gi) Diarrhea  . Lansoprazole Nausea And Vomiting    PAST MEDICAL HISTORY Past Medical History:  Diagnosis Date  . Anemia   . Anxiety   . Arthritis   . Asthma   . Brittle bone disease   . CKD (chronic kidney disease)   . Complication of anesthesia    hard to wake up after anesthesia, trouble turning head  . Coronary artery disease, non-occlusive 2014   a. minimal CAD by cath in 2014 with 40% RI stenosis. b. low-risk NST in 11/2014.  Marland Kitchen Depression   . Diabetes mellitus type 2, insulin dependent ([place])   . Diabetic neuropathy ([place])   . Diabetic retinopathy ([place])    NPDR OU  . Diastolic dysfunction, left ventricle 11/30/14   EF 55%, grade 1 DD  . DVT (deep venous thrombosis) ([place])    "18 in RLE; 7 LLE prior to PE" (03/24/2015)  . Family history of adverse reaction to anesthesia    " the whole family is hard to wake up"  . Glaucoma   . H/O hiatal hernia   . Hyperlipidemia   . Hypertension associated with diabetes ([place])   . Hypertensive retinopathy    OU  . Hypothyroidism   . Kidney stones    "passed them"  . Macular degeneration    Exu OU  . Memory loss 06/03/2014  . On home oxygen therapy     "2L oxygen concentrator @ night"  . Osteoporosis   . Oxygen desaturation during sleep    wears 2 liters of oxygen at night   . Palpitations 35years ago   Cardionet monitor - revealed mostly normal sinus rhythm, sinus bradycardia with first-degree A-V block heart rates mostly in the 50s and 60s with some 70s. No arrhythmias, PVCs or PACs noted.  . PE (pulmonary thromboembolism) ([place]) x 3, last one was ~ 2003   history of recurrent RLE DVT with PE - last PE ~>13 yrs; Maintained on Plavix  . Pneumonia   . Presence of permanent cardiac pacemaker   . Restless legs syndrome   . Sleep apnea    cpap disontinued; test done 07/14/2008 ordered per  [place]  . Spinal headache   . Spinal stenosis    L 3, L 4 and L 5, and C 1, C 2 and C3  . Stroke [facility]) 04-19-2013   tia and stroke. Weakness Rt hand  . 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 s/p [employer] PPM in 03/2015 by Dr. Lovena Le secondary to sinus node dysfunction.   Marland Kitchen TIA (transient ischemic attack) March 2014   Past Surgical History:  Procedure Laterality Date  . ABDOMINAL HYSTERECTOMY    . APPENDECTOMY    . BACK SURGERY     steroid inj  . CATARACT EXTRACTION Bilateral   . CATARACT EXTRACTION W/ INTRAOCULAR LENS  IMPLANT, BILATERAL Bilateral 2000s  . CHOLECYSTECTOMY    . EP IMPLANTABLE DEVICE N/A 03/24/2015   [facility] PPM, Dr. Lovena Le  . ESOPHAGOGASTRODUODENOSCOPY  08/09/2011   Procedure: ESOPHAGOGASTRODUODENOSCOPY (EGD);  Surgeon: Jeryl Columbia, MD;  Location: [facility];  Service: Endoscopy;  Laterality: N/A;  . ESOPHAGOGASTRODUODENOSCOPY (EGD) WITH PROPOFOL N/A 11/12/2013   Procedure: ESOPHAGOGASTRODUODENOSCOPY (EGD) WITH PROPOFOL;  Surgeon: Jeryl Columbia, MD;  Location: [facility];  Service: Endoscopy;  Laterality: N/A;  . ESOPHAGOGASTRODUODENOSCOPY (EGD) WITH PROPOFOL N/A 05/05/2016   Procedure: ESOPHAGOGASTRODUODENOSCOPY (EGD) WITH PROPOFOL;  Surgeon: Clarene Essex, MD;  Location:  [facility];  Service: Endoscopy;   Laterality: N/A;  . EYE SURGERY Bilateral    Cat Sx OU  . GLAUCOMA SURGERY Bilateral 2000s   "laser"  . HOT HEMOSTASIS  08/09/2011   Procedure: HOT HEMOSTASIS (ARGON PLASMA COAGULATION/BICAP);  Surgeon: Jeryl Columbia, MD;  Location: [facility];  Service: Endoscopy;  Laterality: N/A;  . HOT HEMOSTASIS N/A 11/12/2013   Procedure: HOT HEMOSTASIS (ARGON PLASMA COAGULATION/BICAP);  Surgeon: Jeryl Columbia, MD;  Location: [facility];  Service: Endoscopy;  Laterality: N/A;  . INSERT / REPLACE / REMOVE PACEMAKER  03/24/2015  . IRIDOTOMY / IRIDECTOMY Bilateral   . JOINT REPLACEMENT    . KNEE SURGERY Left done with 2nd left knee replacement   spacer bar placement  . LEFT HEART CATHETERIZATION WITH CORONARY ANGIOGRAM N/A 02/14/2012   Procedure: LEFT HEART CATHETERIZATION WITH CORONARY ANGIOGRAM;  Surgeon: Leonie Man, MD;  Location: [facility] C[facility] LAB;  Service: Cardiovascular:: no evidence of obstructive coronary disease ,low EDP with normal EF  . LEV DOPPLER  05/29/2012   RIGHT EXTREM. NORMAL VENOUS DUPLEX  . NM MYOCAR PERF WALL MOTION  08/2019   EF 55 to 65%.  No ischemia or infarction.  LOW RISK:  . PACEMAKER PLACEMENT    . REVISION TOTAL KNEE ARTHROPLASTY Left   . TONSILLECTOMY    . TOTAL KNEE ARTHROPLASTY Bilateral   . TRANSTHORACIC ECHOCARDIOGRAM  11/2017   11/2017: EF 60-65%. Gr1 DD. PAP ~33 mmHg.  Mild TR. PPM leads in RA & RV.   Marland Kitchen TRIGGER FINGER RELEASE  11/22/2011   Procedure: RELEASE TRIGGER FINGER/A-1 PULLEY;  Surgeon: Meredith Pel, MD;  Location: [facility];  Service: Orthopedics;  Laterality: Left;  Left trigger thumb release  . TRIGGER FINGER RELEASE Right yrs ago    FAMILY HISTORY Family History  Problem Relation Age of Onset  . Cancer Mother   . Diabetes Mother   . Cancer - Prostate Father   . Diabetes Father   . Retinal detachment Son   . Diabetes Sister   . Diabetes Brother   . Diabetes Paternal Grandmother     SOCIAL HISTORY Social History   Tobacco Use  . Smoking  status: Passive Smoke Exposure - Never Smoker  . Smokeless tobacco: Never Used  Vaping Use  . Vaping Use: Never used  Substance Use Topics  . Alcohol use: No    Alcohol/week: 0.0 standard drinks  . Drug use: No         OPHTHALMIC EXAM:  Base Eye Exam    Visual Acuity (Snellen - Linear)      Right Left   Dist [place] 20/25 20/30   Dist ph [place] NI NI       Tonometry (Tonopen, 1:42 PM)      Right Left   Pressure 14 12       Pupils      Dark Light Shape React APD   Right 3 2 Round Brisk None   Left 3 2 Round Brisk None       Visual Fields (Counting fingers)      Left Right    Full Full       Extraocular Movement      Right Left    Full, Ortho Full, Ortho       Neuro/Psych    Oriented x3: Yes   Mood/Affect: Normal       Dilation    Both eyes: 1.0% Mydriacyl, 2.5% Phenylephrine @ 1:42 PM  Slit Lamp and Fundus Exam    Slit Lamp Exam      Right Left   Lids/Lashes Dermatochalasis - upper lid, Meibomian gland dysfunction Dermatochalasis - lower lid, Meibomian gland dysfunction   Conjunctiva/Sclera mild melanosis White and quiet   Cornea arcus, 1+ PEE, no edema arcus, well healed temporal cataract wound, 1+PEE   Anterior Chamber Deep and quiet Deep and quiet   Iris round, no NVI, poorly dilated Round, mild atrophy inferotemporal, poorly dilated   Lens PCIOL PC IOL   Vitreous Vitreous syneresis Vitreous syneresis       Fundus Exam      Right Left   Disc pink and sharp, central cupping, Thin inferior rim pink and sharp, central Pallor, +cupping   C/D Ratio 0.7 0.6   Macula good foveal reflex, RPE motltling and clumping, PED, RPE atrophy, no heme large PED nasal macula, blunted foveal reflex, scattered MA, RPE mottling   Vessels  Vascular attenuation, mild tortuousity Vascular attenuation, mildly tortuous.   Periphery attached attached          IMAGING AND PROCEDURES  Imaging and Procedures for @TODAY @  OCT, Retina - OU - Both Eyes       Right  Eye Quality was good. Central Foveal Thickness: 203. Progression has been stable. Findings include normal foveal contour, no IRF, no SRF, pigment epithelial detachment, epiretinal membrane, macular pucker, retinal drusen , outer retinal atrophy, vitreomacular adhesion  (stable).   Left Eye Quality was good. Central Foveal Thickness: 211. Progression has been stable. Findings include pigment epithelial detachment, normal foveal contour, no IRF, no SRF, retinal drusen , vitreomacular adhesion .   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 CME - cystoid macular edema. PED - pigment epithelial detachment. IRF - intraretinal fluid. SRF - subretinal fluid. EZ - ellipsoid zone. ERM - epiretinal membrane. ORA - outer retinal atrophy. ORT - outer retinal tubulation. SRHM - subretinal hyper-reflective material                 ASSESSMENT/PLAN:    ICD-10-CM   1. 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 Intermediate stage nonexudative age-related macular degeneration of both eyes  H35.3132   3. Retinal edema  H35.81 OCT, Retina - OU - Both Eyes  4. 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 Essential hypertension  I10   6. Hypertensive retinopathy of both eyes  H35.033   7. Pseudophakia of both eyes  Z96.1   8. Fuchs' corneal dystrophy of both eyes  H18.513   9. Diplopia  H53.2     1-3. 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 Mild nonproliferative diabetic retinopathy w/o DME, both eyes  - exam with scattered MA, IRH OU  - FA shows mild MA; no NV  - OCT without macular edema, both eyes  5,6. Hypertensive retinopathy OU  - discussed importance of tight BP control  - monitor  7. Pseudophakia OU  - s/p CE/IOL  - beautiful surgeries, doing well  - monitor  8. Fuch's endothelial dystrophy  - mild guttata OU, no edema  9. Horizontal binocular diplopia  - mild XT noted on exam  - managed by Dr. Herbert Deaner -- prism prescribed  - pt reports improvement in diplopia w/ glasses but states that she doesn't wear them that much   Ophthalmic Meds Ordered this visit:  No orders of the defined types were placed in this encounter.      Return in about 6 months (around 10/01/2020) for f/u exu ARMD OU, DFE, OCT.  There are no Patient Instructions on file for this visit.   Explained the diagnoses, plan, and follow up with the patient and they expressed understanding.  Patient expressed understanding of the importance of proper follow up care.   This document serves as a record of services personally performed by Gardiner Sleeper, MD, PhD. It was created on their behalf by San Jetty. Owens Shark, OA an ophthalmic technician. The creation of this record is the provider's dictation and/or activities during the visit.    Electronically signed by: San Jetty. Owens Shark, [place] 02.21.2022 3:47 PM  Gardiner Sleeper, M.D., Ph.D. Diseases & Surgery of the Retina and Vitreous [facility]  I have reviewed the above documentation for accuracy and completeness, and I agree with the above. Gardiner Sleeper, M.D.,  Ph.D. 04/03/20 3:47 PM   Abbreviations: M myopia (nearsighted); A astigmatism; H hyperopia (farsighted); P presbyopia; Mrx spectacle prescription;  CTL contact lenses; OD right eye; OS left eye; OU both eyes  XT exotropia; ET esotropia; PEK punctate epithelial keratitis; PEE punctate epithelial erosions; DES dry eye syndrome; MGD meibomian gland dysfunction; ATs artificial tears; PFAT's preservative free artificial tears; [place] nuclear sclerotic cataract; PSC posterior subcapsular cataract; ERM epi-retinal membrane; PVD posterior vitreous detachment; RD retinal detachment; DM diabetes mellitus; DR diabetic retinopathy; NPDR non-proliferative diabetic retinopathy; PDR proliferative diabetic retinopathy; CSME clinically significant macular edema; DME diabetic macular edema; dbh dot blot hemorrhages; CWS cotton wool spot; POAG primary open angle glaucoma; C/D cup-to-disc ratio; HVF humphrey visual field; GVF goldmann visual field; OCT optical coherence tomography; IOP intraocular pressure; BRVO Branch retinal vein occlusion; CRVO central retinal vein occlusion; CRAO central retinal artery occlusion; BRAO branch retinal artery occlusion; RT retinal tear; SB scleral buckle; PPV pars plana vitrectomy; VH Vitreous hemorrhage; PRP panretinal laser photocoagulation; IVK intravitreal kenalog; VMT vitreomacular traction; MH Macular hole;  NVD neovascularization of the disc; NVE neovascularization elsewhere; AREDS age related eye disease study; ARMD age related macular degeneration; POAG primary open angle glaucoma; EBMD epithelial/anterior basement membrane dystrophy; ACIOL anterior chamber intraocular lens; IOL intraocular lens; PCIOL posterior chamber intraocular lens; Phaco/IOL phacoemulsification with intraocular lens placement; [place] photorefractive keratectomy; LASIK laser assisted in situ keratomileusis; HTN hypertension; DM diabetes mellitus; COPD chronic obstructive pulmonary disease

## 2020-04-03 ENCOUNTER — Other Ambulatory Visit: Payer: Self-pay

## 2020-04-03 ENCOUNTER — Ambulatory Visit (INDEPENDENT_AMBULATORY_CARE_PROVIDER_SITE_OTHER): Payer: Medicare HMO | Admitting: Ophthalmology

## 2020-04-03 ENCOUNTER — Encounter (INDEPENDENT_AMBULATORY_CARE_PROVIDER_SITE_OTHER): Payer: Self-pay | Admitting: Ophthalmology

## 2020-04-03 DIAGNOSIS — H353232 Exudative age-related macular degeneration, bilateral, with inactive choroidal neovascularization: Secondary | ICD-10-CM

## 2020-04-03 DIAGNOSIS — E113293 Type 2 diabetes mellitus with mild nonproliferative diabetic retinopathy without macular edema, bilateral: Secondary | ICD-10-CM | POA: Diagnosis not present

## 2020-04-03 DIAGNOSIS — H3581 Retinal edema: Secondary | ICD-10-CM | POA: Diagnosis not present

## 2020-04-03 DIAGNOSIS — H18513 Endothelial corneal dystrophy, bilateral: Secondary | ICD-10-CM

## 2020-04-03 DIAGNOSIS — H35033 Hypertensive retinopathy, bilateral: Secondary | ICD-10-CM

## 2020-04-03 DIAGNOSIS — Z961 Presence of intraocular lens: Secondary | ICD-10-CM

## 2020-04-03 DIAGNOSIS — H35723 Serous detachment of retinal pigment epithelium, bilateral: Secondary | ICD-10-CM | POA: Diagnosis not present

## 2020-04-03 DIAGNOSIS — H353132 Nonexudative age-related macular degeneration, bilateral, intermediate dry stage: Secondary | ICD-10-CM

## 2020-04-03 DIAGNOSIS — H532 Diplopia: Secondary | ICD-10-CM

## 2020-04-03 DIAGNOSIS — I10 Essential (primary) hypertension: Secondary | ICD-10-CM

## 2020-04-03 DIAGNOSIS — E113393 Type 2 diabetes mellitus with moderate nonproliferative diabetic retinopathy without macular edema, bilateral: Secondary | ICD-10-CM

## 2020-04-08 ENCOUNTER — Telehealth: Payer: Self-pay | Admitting: Adult Health

## 2020-04-08 NOTE — Telephone Encounter (Signed)
At baseline, she is sedentary but ambulating only very short distances otherwise transfers via wheelchair but if this is a noticeable decline compared to her baseline, I would agree to proceed to ED for further evaluation.  Thank you.

## 2020-04-08 NOTE — Telephone Encounter (Signed)
Called patient's husband back.  He said he believes patient has had a stroke as she is having trouble walking and leg weakness she has never had before. Discussed multiple reasons why patient needs to be evaluated by ED to determine cause.  Patient's husband [name]) stated he would take her to the ED for evaluation.

## 2020-04-08 NOTE — Telephone Encounter (Signed)
Pt called stating that the pt thinks she may have had a stroke about a week ago and her legs are feeling week. He states that she has not been taken to the hospital to be checked out. Husband would like to discuss with RN. Please advise.

## 2020-04-08 NOTE — Telephone Encounter (Signed)
Agree with plan 

## 2020-04-09 ENCOUNTER — Encounter (HOSPITAL_COMMUNITY): Payer: Self-pay

## 2020-04-09 ENCOUNTER — Other Ambulatory Visit: Payer: Self-pay

## 2020-04-09 ENCOUNTER — Emergency Department (HOSPITAL_COMMUNITY): Payer: Medicare HMO

## 2020-04-09 ENCOUNTER — Observation Stay (HOSPITAL_COMMUNITY)
Admission: EM | Admit: 2020-04-09 | Discharge: 2020-04-11 | Disposition: A | Discharge status: Home / Self Care | Payer: Medicare HMO | Attending: Internal Medicine | Admitting: Internal Medicine

## 2020-04-09 ENCOUNTER — Observation Stay (HOSPITAL_COMMUNITY): Payer: Medicare HMO

## 2020-04-09 DIAGNOSIS — E1129 Type 2 diabetes mellitus with other diabetic kidney complication: Secondary | ICD-10-CM | POA: Diagnosis present

## 2020-04-09 DIAGNOSIS — Z7722 Contact with and (suspected) exposure to environmental tobacco smoke (acute) (chronic): Secondary | ICD-10-CM | POA: Insufficient documentation

## 2020-04-09 DIAGNOSIS — E1122 Type 2 diabetes mellitus with diabetic chronic kidney disease: Secondary | ICD-10-CM | POA: Insufficient documentation

## 2020-04-09 DIAGNOSIS — Z794 Long term (current) use of insulin: Secondary | ICD-10-CM | POA: Diagnosis not present

## 2020-04-09 DIAGNOSIS — Z9981 Dependence on supplemental oxygen: Secondary | ICD-10-CM | POA: Insufficient documentation

## 2020-04-09 DIAGNOSIS — E113559 Type 2 diabetes mellitus with stable proliferative diabetic retinopathy, unspecified eye: Secondary | ICD-10-CM | POA: Diagnosis not present

## 2020-04-09 DIAGNOSIS — Z7902 Long term (current) use of antithrombotics/antiplatelets: Secondary | ICD-10-CM | POA: Insufficient documentation

## 2020-04-09 DIAGNOSIS — N183 Chronic kidney disease, stage 3 unspecified: Secondary | ICD-10-CM

## 2020-04-09 DIAGNOSIS — Z8673 Personal history of transient ischemic attack (TIA), and cerebral infarction without residual deficits: Secondary | ICD-10-CM | POA: Diagnosis not present

## 2020-04-09 DIAGNOSIS — E039 Hypothyroidism, unspecified: Secondary | ICD-10-CM | POA: Diagnosis not present

## 2020-04-09 DIAGNOSIS — F32A Depression, unspecified: Secondary | ICD-10-CM | POA: Diagnosis not present

## 2020-04-09 DIAGNOSIS — R531 Weakness: Secondary | ICD-10-CM | POA: Diagnosis present

## 2020-04-09 DIAGNOSIS — R29898 Other symptoms and signs involving the musculoskeletal system: Secondary | ICD-10-CM | POA: Diagnosis not present

## 2020-04-09 DIAGNOSIS — I5030 Unspecified diastolic (congestive) heart failure: Secondary | ICD-10-CM | POA: Insufficient documentation

## 2020-04-09 DIAGNOSIS — Z950 Presence of cardiac pacemaker: Secondary | ICD-10-CM | POA: Diagnosis present

## 2020-04-09 DIAGNOSIS — Z7984 Long term (current) use of oral hypoglycemic drugs: Secondary | ICD-10-CM | POA: Diagnosis not present

## 2020-04-09 DIAGNOSIS — E785 Hyperlipidemia, unspecified: Secondary | ICD-10-CM | POA: Diagnosis not present

## 2020-04-09 DIAGNOSIS — I10 Essential (primary) hypertension: Secondary | ICD-10-CM | POA: Diagnosis present

## 2020-04-09 DIAGNOSIS — Z20822 Contact with and (suspected) exposure to covid-19: Secondary | ICD-10-CM | POA: Diagnosis not present

## 2020-04-09 DIAGNOSIS — J45909 Unspecified asthma, uncomplicated: Secondary | ICD-10-CM | POA: Diagnosis not present

## 2020-04-09 DIAGNOSIS — E1121 Type 2 diabetes mellitus with diabetic nephropathy: Secondary | ICD-10-CM | POA: Insufficient documentation

## 2020-04-09 DIAGNOSIS — F419 Anxiety disorder, unspecified: Secondary | ICD-10-CM | POA: Diagnosis not present

## 2020-04-09 DIAGNOSIS — Z79899 Other long term (current) drug therapy: Secondary | ICD-10-CM | POA: Insufficient documentation

## 2020-04-09 DIAGNOSIS — N1832 Chronic kidney disease, stage 3b: Secondary | ICD-10-CM | POA: Diagnosis not present

## 2020-04-09 DIAGNOSIS — I130 Hypertensive heart and chronic kidney disease with heart failure and stage 1 through stage 4 chronic kidney disease, or unspecified chronic kidney disease: Secondary | ICD-10-CM | POA: Diagnosis not present

## 2020-04-09 LAB — COMPREHENSIVE METABOLIC PANEL
ALT: 13 U/L (ref 0–44)
AST: 20 U/L (ref 15–41)
Albumin: 3.7 g/dL (ref 3.5–5.0)
Alkaline Phosphatase: 63 U/L (ref 38–126)
Anion gap: 11 (ref 5–15)
BUN: 18 mg/dL (ref 8–23)
CO2: 25 mmol/L (ref 22–32)
Calcium: 9.4 mg/dL (ref 8.9–10.3)
Chloride: 102 mmol/L (ref 98–111)
Creatinine, Ser: 1.64 mg/dL — ABNORMAL HIGH (ref 0.44–1.00)
GFR, Estimated: 31 mL/min — ABNORMAL LOW (ref >60)
Glucose, Bld: 227 mg/dL — ABNORMAL HIGH (ref 70–99)
Potassium: 4 mmol/L (ref 3.5–5.1)
Sodium: 138 mmol/L (ref 135–145)
Total Bilirubin: 0.6 mg/dL (ref 0.3–1.2)
Total Protein: 7.1 g/dL (ref 6.5–8.1)

## 2020-04-09 LAB — CBC WITH DIFFERENTIAL/PLATELET
Abs Immature Granulocytes: 0.02 10*3/uL (ref 0.00–0.07)
Basophils Absolute: 0.1 10*3/uL (ref 0.0–0.1)
Basophils Relative: 1 %
Eosinophils Absolute: 0.1 10*3/uL (ref 0.0–0.5)
Eosinophils Relative: 2 %
HCT: 39.1 % (ref 36.0–46.0)
Hemoglobin: 12.2 g/dL (ref 12.0–15.0)
Immature Granulocytes: 0 %
Lymphocytes Relative: 20 %
Lymphs Abs: 1.3 10*3/uL (ref 0.7–4.0)
MCH: 31.2 pg (ref 26.0–34.0)
MCHC: 31.2 g/dL (ref 30.0–36.0)
MCV: 100 fL (ref 80.0–100.0)
Monocytes Absolute: 0.4 10*3/uL (ref 0.1–1.0)
Monocytes Relative: 7 %
Neutro Abs: 4.5 10*3/uL (ref 1.7–7.7)
Neutrophils Relative %: 70 %
Platelets: 210 10*3/uL (ref 150–400)
RBC: 3.91 MIL/uL (ref 3.87–5.11)
RDW: 12.3 % (ref 11.5–15.5)
WBC: 6.4 10*3/uL (ref 4.0–10.5)
nRBC: 0 % (ref 0.0–0.2)

## 2020-04-09 LAB — URINALYSIS, ROUTINE W REFLEX MICROSCOPIC
Bacteria, UA: NONE SEEN
Bilirubin Urine: NEGATIVE
Glucose, UA: NEGATIVE mg/dL
Hgb urine dipstick: NEGATIVE
Ketones, ur: NEGATIVE mg/dL
Leukocytes,Ua: NEGATIVE
Nitrite: NEGATIVE
Protein, ur: NEGATIVE mg/dL
Specific Gravity, Urine: 1.013 (ref 1.005–1.030)
pH: 5 (ref 5.0–8.0)

## 2020-04-09 LAB — CBG MONITORING, ED
Glucose-Capillary: 128 mg/dL — ABNORMAL HIGH (ref 70–99)
Glucose-Capillary: 169 mg/dL — ABNORMAL HIGH (ref 70–99)

## 2020-04-09 LAB — MAGNESIUM: Magnesium: 1.9 mg/dL (ref 1.7–2.4)

## 2020-04-09 LAB — LACTIC ACID, PLASMA: Lactic Acid, Venous: 1.5 mmol/L (ref 0.5–1.9)

## 2020-04-09 LAB — TROPONIN I (HIGH SENSITIVITY)
Troponin I (High Sensitivity): 5 ng/L (ref <18)
Troponin I (High Sensitivity): 5 ng/L (ref <18)

## 2020-04-09 MED ORDER — DICYCLOMINE HCL 20 MG PO TABS
20.0000 mg | ORAL_TABLET | Freq: Every day | ORAL | Status: DC
  Administered 2020-04-10: 20 mg via ORAL
  Filled 2020-04-09 (×3): qty 1

## 2020-04-09 MED ORDER — INSULIN ASPART 100 UNIT/ML ~~LOC~~ SOLN
0.0000 [IU] | Freq: Three times a day (TID) | SUBCUTANEOUS | Status: DC
  Administered 2020-04-10: 5 [IU] via SUBCUTANEOUS
  Administered 2020-04-10: 2 [IU] via SUBCUTANEOUS
  Administered 2020-04-10 – 2020-04-11 (×2): 8 [IU] via SUBCUTANEOUS
  Administered 2020-04-11: 3 [IU] via SUBCUTANEOUS
  Filled 2020-04-09: qty 0.15

## 2020-04-09 MED ORDER — CLOPIDOGREL BISULFATE 75 MG PO TABS
75.0000 mg | ORAL_TABLET | Freq: Every morning | ORAL | Status: DC
  Administered 2020-04-10 – 2020-04-11 (×2): 75 mg via ORAL
  Filled 2020-04-09: qty 1

## 2020-04-09 MED ORDER — PANTOPRAZOLE SODIUM 40 MG PO TBEC
40.0000 mg | DELAYED_RELEASE_TABLET | Freq: Every day | ORAL | Status: DC
  Administered 2020-04-10 – 2020-04-11 (×2): 40 mg via ORAL
  Filled 2020-04-09 (×2): qty 1

## 2020-04-09 MED ORDER — INSULIN NPH (HUMAN) (ISOPHANE) 100 UNIT/ML ~~LOC~~ SUSP
20.0000 [IU] | Freq: Every day | SUBCUTANEOUS | Status: DC
  Administered 2020-04-10 – 2020-04-11 (×2): 20 [IU] via SUBCUTANEOUS
  Filled 2020-04-09: qty 10

## 2020-04-09 MED ORDER — CLONAZEPAM 0.5 MG PO TABS
0.5000 mg | ORAL_TABLET | Freq: Every day | ORAL | Status: DC
  Administered 2020-04-09 – 2020-04-10 (×2): 0.5 mg via ORAL
  Filled 2020-04-09 (×2): qty 1

## 2020-04-09 MED ORDER — DONEPEZIL HCL 5 MG PO TABS
10.0000 mg | ORAL_TABLET | Freq: Every day | ORAL | Status: DC
Start: 2020-04-10 — End: 2020-04-11
  Administered 2020-04-10 (×2): 10 mg via ORAL
  Filled 2020-04-09 (×2): qty 2

## 2020-04-09 MED ORDER — ALBUTEROL SULFATE HFA 108 (90 BASE) MCG/ACT IN AERS
2.0000 | INHALATION_SPRAY | Freq: Four times a day (QID) | RESPIRATORY_TRACT | Status: DC | PRN
  Filled 2020-04-09: qty 6.7

## 2020-04-09 MED ORDER — ACETAMINOPHEN 650 MG RE SUPP: 650.0000 mg | RECTAL | Status: DC | PRN

## 2020-04-09 MED ORDER — LEVOTHYROXINE SODIUM 100 MCG PO TABS
100.0000 ug | ORAL_TABLET | Freq: Every day | ORAL | Status: DC
  Administered 2020-04-10 – 2020-04-11 (×2): 100 ug via ORAL
  Filled 2020-04-09 (×2): qty 1

## 2020-04-09 MED ORDER — INSULIN ASPART 100 UNIT/ML ~~LOC~~ SOLN
0.0000 [IU] | Freq: Every day | SUBCUTANEOUS | Status: DC
  Administered 2020-04-10: 2 [IU] via SUBCUTANEOUS
  Filled 2020-04-09: qty 0.05

## 2020-04-09 MED ORDER — VITAMIN B-12 100 MCG PO TABS
100.0000 ug | ORAL_TABLET | Freq: Every day | ORAL | Status: DC
  Filled 2020-04-09: qty 1

## 2020-04-09 MED ORDER — NORTRIPTYLINE HCL 25 MG PO CAPS
50.0000 mg | ORAL_CAPSULE | Freq: Two times a day (BID) | ORAL | Status: DC
Start: 2020-04-10 — End: 2020-04-11
  Administered 2020-04-10 – 2020-04-11 (×4): 50 mg via ORAL
  Filled 2020-04-09 (×5): qty 2

## 2020-04-09 MED ORDER — STROKE: EARLY STAGES OF RECOVERY BOOK
Freq: Once | Status: AC
  Filled 2020-04-09 (×2): qty 1

## 2020-04-09 MED ORDER — ENOXAPARIN SODIUM 60 MG/0.6ML ~~LOC~~ SOLN
60.0000 mg | SUBCUTANEOUS | Status: DC
  Administered 2020-04-09 – 2020-04-10 (×2): 60 mg via SUBCUTANEOUS
  Filled 2020-04-09 (×2): qty 0.6

## 2020-04-09 MED ORDER — MOMETASONE FURO-FORMOTEROL FUM 100-5 MCG/ACT IN AERO
2.0000 | INHALATION_SPRAY | Freq: Two times a day (BID) | RESPIRATORY_TRACT | Status: DC
  Administered 2020-04-10 (×3): 2 via RESPIRATORY_TRACT
  Filled 2020-04-09: qty 8.8

## 2020-04-09 MED ORDER — ROSUVASTATIN CALCIUM 20 MG PO TABS
20.0000 mg | ORAL_TABLET | Freq: Every day | ORAL | Status: DC
  Administered 2020-04-10 – 2020-04-11 (×2): 20 mg via ORAL
  Filled 2020-04-09 (×2): qty 1

## 2020-04-09 MED ORDER — POLYETHYLENE GLYCOL 3350 17 G PO PACK: 17.0000 g | PACK | Freq: Every day | ORAL | Status: DC | PRN

## 2020-04-09 MED ORDER — GUAIFENESIN ER 600 MG PO TB12: 600.0000 mg | ORAL_TABLET | Freq: Two times a day (BID) | ORAL | Status: DC | PRN

## 2020-04-09 MED ORDER — ACETAMINOPHEN 160 MG/5ML PO SOLN: 650.0000 mg | ORAL | Status: DC | PRN

## 2020-04-09 MED ORDER — BUSPIRONE HCL 5 MG PO TABS
10.0000 mg | ORAL_TABLET | Freq: Every morning | ORAL | Status: DC
  Administered 2020-04-10 – 2020-04-11 (×2): 10 mg via ORAL
  Filled 2020-04-09: qty 2

## 2020-04-09 MED ORDER — RISAQUAD PO CAPS
1.0000 | ORAL_CAPSULE | Freq: Every day | ORAL | Status: DC
  Administered 2020-04-10 – 2020-04-11 (×2): 1 via ORAL
  Filled 2020-04-09 (×2): qty 1

## 2020-04-09 MED ORDER — LINACLOTIDE 145 MCG PO CAPS
290.0000 ug | ORAL_CAPSULE | Freq: Every day | ORAL | Status: DC
  Administered 2020-04-10 – 2020-04-11 (×2): 290 ug via ORAL
  Filled 2020-04-09 (×2): qty 2

## 2020-04-09 MED ORDER — CLONAZEPAM 0.5 MG PO TABS: 0.5000 mg | ORAL_TABLET | ORAL | Status: DC

## 2020-04-09 MED ORDER — CLONAZEPAM 1 MG PO TABS
1.0000 mg | ORAL_TABLET | Freq: Every day | ORAL | Status: DC
  Administered 2020-04-10 – 2020-04-11 (×2): 1 mg via ORAL
  Filled 2020-04-09 (×2): qty 1

## 2020-04-09 MED ORDER — ACETAMINOPHEN 325 MG PO TABS: 650.0000 mg | ORAL_TABLET | ORAL | Status: DC | PRN

## 2020-04-09 MED ORDER — DICYCLOMINE HCL 20 MG PO TABS
40.0000 mg | ORAL_TABLET | Freq: Every day | ORAL | Status: DC
  Administered 2020-04-10: 20 mg via ORAL
  Administered 2020-04-11: 40 mg via ORAL
  Filled 2020-04-09 (×2): qty 2

## 2020-04-09 MED ORDER — DARIFENACIN HYDROBROMIDE ER 7.5 MG PO TB24
7.5000 mg | ORAL_TABLET | Freq: Every day | ORAL | Status: DC
  Administered 2020-04-10 – 2020-04-11 (×2): 7.5 mg via ORAL
  Filled 2020-04-09 (×2): qty 1

## 2020-04-09 NOTE — ED Notes (Signed)
ED TO INPATIENT HANDOFF REPORT  Name/Age/Gender Tara Jackson 80 y.o. female  Code Status    Code Status Orders  (From admission, onward)         Start     Ordered   04/09/20 2057  Full code  Continuous        04/09/20 2100        Code Status History    Date Active Date Inactive Code Status Order ID Comments User Context   09/12/2017 2248 09/14/2017 2003 Full Code [id number]  Bethena Roys, MD Inpatient   10/23/2016 1948 10/26/2016 1540 Full Code [id number]  Norval Morton, MD ED   10/11/2016 0341 10/14/2016 2043 Full Code [id number]  Rise Patience, MD ED   03/24/2015 1602 03/25/2015 1603 Full Code [id number]  Skeet Latch, MD Inpatient   11/28/2014 1537 11/30/2014 1811 Full Code [id number]  Debbe Odea, MD Inpatient   04/19/2014 0306 04/21/2014 1424 Full Code [id number]  Theressa Millard, MD Inpatient   04/19/2012 2134 04/20/2012 1600 Full Code [id number]  [name], MD Inpatient   Advance Care Planning Activity    Advance Directive Documentation   Flowsheet Row Most Recent Value  Type of Advance Directive Healthcare Power of Attorney, Living will  Pre-existing out of facility DNR order (yellow form or pink MOST form) --  "MOST" Form in Place? --      Home/SNF/Other Home  Chief Complaint Lower extremity weakness [R29.898]  Level of Care/Admitting Diagnosis ED Disposition    ED Disposition Condition [place]: [place] [100100]  Level of Care: Telemetry Medical [104]  I expect the patient will be discharged within 24 hours: No (not a candidate for 5C-Observation unit)  Covid Evaluation: Asymptomatic Screening Protocol (No Symptoms)  Diagnosis: Lower extremity weakness [275170]  Admitting Physician: Etta Quill [[id number]]  Attending Physician: Etta Quill [phone]       Medical History Past Medical History:  Diagnosis Date  . Anemia   . Anxiety   . Arthritis   . Asthma   . Brittle bone disease   .  CKD (chronic kidney disease)   . Complication of anesthesia    hard to wake up after anesthesia, trouble turning head  . Coronary artery disease, non-occlusive 2014   a. minimal CAD by cath in 2014 with 40% RI stenosis. b. low-risk NST in 11/2014.  Marland Kitchen Depression   . Diabetes mellitus type 2, insulin dependent ([place])   . Diabetic neuropathy ([place])   . Diabetic retinopathy ([place])    NPDR OU  . Diastolic dysfunction, left ventricle 11/30/14   EF 55%, grade 1 DD  . DVT (deep venous thrombosis) ([place])    "18 in RLE; 7 LLE prior to PE" (03/24/2015)  . Family history of adverse reaction to anesthesia    " the whole family is hard to wake up"  . Glaucoma   . H/O hiatal hernia   . Hyperlipidemia   . Hypertension associated with diabetes ([place])   . Hypertensive retinopathy    OU  . Hypothyroidism   . Kidney stones    "passed them"  . Macular degeneration    Exu OU  . Memory loss 06/03/2014  . On home oxygen therapy    "2L oxygen concentrator @ night"  . Osteoporosis   . Oxygen desaturation during sleep    wears 2 liters of oxygen at night   . Palpitations 35years ago   Cardionet monitor - revealed  mostly normal sinus rhythm, sinus bradycardia with first-degree A-V block heart rates mostly in the 50s and 60s with some 70s. No arrhythmias, PVCs or PACs noted.  . 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 Pneumonia   . Presence of permanent cardiac pacemaker   . Restless legs syndrome   . Sleep apnea    cpap disontinued; test done 07/14/2008 ordered per  [place]  . Spinal headache   . Spinal stenosis    L 3, L 4 and L 5, and C 1, C 2 and C3  . Stroke [facility]) 04-19-2013   tia and stroke. Weakness Rt hand  . 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 s/p [employer] PPM in 03/2015 by Dr. Lovena Le secondary to sinus node dysfunction.   Marland Kitchen TIA (transient ischemic attack) March 2014     Allergies Allergies  Allergen Reactions  . Iodine Other (See Comments)    ONLY IV--Makes unconscious  . Iohexol Other (See Comments)     Code: SOB, Desc: cardiac arrest w/ iv contrast, has never used 13 hr prep//alice calhoun, Onset Date: [id number]   . Metoclopramide Hcl Other (See Comments)    suicidal  . Sulfa Antibiotics Hives  . Lactose Intolerance (Gi) Diarrhea  . Lansoprazole Nausea And Vomiting    IV Location/Drains/Wounds Patient Lines/Drains/Airways Status    Active Line/Drains/Airways    Name Placement date Placement time Site Days   Peripheral IV 04/09/20 Left Antecubital 04/09/20  1703  Antecubital  less than 1   External Urinary Catheter 04/09/20  --  --  less than 1          Labs/Imaging Results for orders placed or performed during the hospital encounter of 04/09/20 (from the past 48 hour(s))  Comprehensive metabolic panel     Status: Abnormal   Collection Time: 04/09/20  5:15 PM  Result Value Ref Range   Sodium 138 135 - 145 mmol/L   Potassium 4.0 3.5 - 5.1 mmol/L   Chloride 102 98 - 111 mmol/L   CO2 25 22 - 32 mmol/L   Glucose, Bld 227 (H) 70 - 99 mg/dL    Comment: Glucose reference range applies only to samples taken after fasting for at least 8 hours.   BUN 18 8 - 23 mg/dL   Creatinine, Ser 1.64 (H) 0.44 - 1.00 mg/dL   Calcium 9.4 8.9 - 10.3 mg/dL   Total Protein 7.1 6.5 - 8.1 g/dL   Albumin 3.7 3.5 - 5.0 g/dL   AST 20 15 - 41 U/L   ALT 13 0 - 44 U/L   Alkaline Phosphatase 63 38 - 126 U/L   Total Bilirubin 0.6 0.3 - 1.2 mg/dL   GFR, Estimated 31 (L) >60 mL/min    Comment: (NOTE) Calculated using the CKD-EPI Creatinine Equation (2021)    Anion gap 11 5 - 15    Comment: Performed at [facility], [place] [address] [place], [place] [address]  CBC with Differential     Status: None   Collection Time: 04/09/20  5:15 PM  Result Value Ref Range   WBC 6.4 4.0 - 10.5 K/uL   RBC 3.91 3.87 - 5.11 MIL/uL   Hemoglobin 12.2 12.0 -  15.0 g/dL   HCT 39.1 36.0 - 46.0 %   MCV 100.0 80.0 - 100.0 fL   MCH 31.2 26.0 - 34.0 pg  MCHC 31.2 30.0 - 36.0 g/dL   RDW 12.3 11.5 - 15.5 %   Platelets 210 150 - 400 K/uL   nRBC 0.0 0.0 - 0.2 %   Neutrophils Relative % 70 %   Neutro Abs 4.5 1.7 - 7.7 K/uL   Lymphocytes Relative 20 %   Lymphs Abs 1.3 0.7 - 4.0 K/uL   Monocytes Relative 7 %   Monocytes Absolute 0.4 0.1 - 1.0 K/uL   Eosinophils Relative 2 %   Eosinophils Absolute 0.1 0.0 - 0.5 K/uL   Basophils Relative 1 %   Basophils Absolute 0.1 0.0 - 0.1 K/uL   Immature Granulocytes 0 %   Abs Immature Granulocytes 0.02 0.00 - 0.07 K/uL    Comment: Performed at [facility], [place] [address] [place], [place] [address]  Troponin I (High Sensitivity)     Status: None   Collection Time: 04/09/20  5:15 PM  Result Value Ref Range   Troponin I (High Sensitivity) 5 <18 ng/L    Comment: (NOTE) Elevated high sensitivity troponin I (hsTnI) values and significant  changes across serial measurements may suggest ACS but many other  chronic and acute conditions are known to elevate hsTnI results.  Refer to the "Links" section for chest pain algorithms and additional  guidance. Performed at [facility], [place] [address], [place], [place] [address]   Lactic acid, plasma     Status: None   Collection Time: 04/09/20  5:15 PM  Result Value Ref Range   Lactic Acid, Venous 1.5 0.5 - 1.9 mmol/L    Comment: Performed at [facility], [place] [address] [place], [place] [address]  Magnesium     Status: None   Collection Time: 04/09/20  5:15 PM  Result Value Ref Range   Magnesium 1.9 1.7 - 2.4 mg/dL    Comment: Performed at [facility], [place] [address] [place], [place] [address]  Troponin I (High Sensitivity)     Status: None   Collection Time: 04/09/20  7:13 PM  Result Value Ref Range   Troponin I (High Sensitivity) 5 <18 ng/L    Comment: (NOTE) Elevated high  sensitivity troponin I (hsTnI) values and significant  changes across serial measurements may suggest ACS but many other  chronic and acute conditions are known to elevate hsTnI results.  Refer to the "Links" section for chest pain algorithms and additional  guidance. Performed at [facility], [place] [address] [place], [place] [address]   CBG monitoring, ED     Status: Abnormal   Collection Time: 04/09/20  7:20 PM  Result Value Ref Range   Glucose-Capillary 128 (H) 70 - 99 mg/dL    Comment: Glucose reference range applies only to samples taken after fasting for at least 8 hours.  Urinalysis, Routine w reflex microscopic     Status: None   Collection Time: 04/09/20  7:47 PM  Result Value Ref Range   Color, Urine YELLOW YELLOW   APPearance CLEAR CLEAR   Specific Gravity, Urine 1.013 1.005 - 1.030   pH 5.0 5.0 - 8.0   Glucose, UA NEGATIVE NEGATIVE mg/dL   Hgb urine dipstick NEGATIVE NEGATIVE   Bilirubin Urine NEGATIVE NEGATIVE   Ketones, ur NEGATIVE NEGATIVE mg/dL   Protein, ur NEGATIVE NEGATIVE mg/dL   Nitrite NEGATIVE NEGATIVE   Leukocytes,Ua NEGATIVE NEGATIVE   RBC / HPF 0-5 0 - 5 RBC/hpf   WBC, UA 0-5 0 - 5 WBC/hpf   Bacteria, UA  NONE SEEN NONE SEEN   Squamous Epithelial / LPF 0-5 0 - 5    Comment: Performed at [facility], [place] [address] [place], [place] [address]   CT Head Wo Contrast  Result Date: 04/09/2020 CLINICAL DATA:  Headache and frequent falls. EXAM: CT HEAD WITHOUT CONTRAST TECHNIQUE: Contiguous axial images were obtained from the base of the skull through the vertex without intravenous contrast. COMPARISON:  March 19, 2019 FINDINGS: Brain: There is mild cerebral atrophy with widening of the extra-axial spaces and ventricular dilatation. There are areas of decreased attenuation within the white matter tracts of the supratentorial brain, consistent with microvascular disease changes. Vascular: No hyperdense vessel or unexpected  calcification. Skull: Negative for an acute fracture or focal lesion. Sinuses/Orbits: There is marked severity right maxillary sinus mucosal thickening. Other: None. IMPRESSION: 1. No acute intracranial abnormality. 2. 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 3. Marked severity right maxillary sinus disease. Electronically Signed   By: Virgina Norfolk M.D.   On: 04/09/2020 18:24   CT Cervical Spine Wo Contrast  Result Date: 04/09/2020 CLINICAL DATA:  Bilateral leg pain and weakness with frequent falls. EXAM: CT CERVICAL SPINE WITHOUT CONTRAST TECHNIQUE: Multidetector CT imaging of the cervical spine was performed without intravenous contrast. Multiplanar CT image reconstructions were also generated. COMPARISON:  March 19, 2019 FINDINGS: Alignment: Normal. Skull base and vertebrae: No acute fracture. No primary bone lesion or focal pathologic process. Soft tissues and spinal canal: No prevertebral fluid or swelling. No visible canal hematoma. Disc levels: Moderate severity endplate sclerosis is seen at the levels of C3-C4 and C5-C6. Mild endplate sclerosis is seen at the level of C6-C7. There is marked severity narrowing of the anterior atlantoaxial articulation. Moderate severity intervertebral disc space narrowing is seen at the levels of C3-C4 and C5-C6. Mild to moderate severity bilateral multilevel facet joint hypertrophy is noted. Upper chest: Negative. Other: There is marked severity right maxillary sinus mucosal thickening. IMPRESSION: 1. No acute osseous abnormality. 2. Moderate severity multilevel degenerative changes, most prominent at the levels of C3-C4 and C5-C6. 3. Marked severity right maxillary sinus disease. Electronically Signed   By: Virgina Norfolk M.D.   On: 04/09/2020 18:26   DG Knee Complete 4 Views Left  Result Date: 04/09/2020 CLINICAL DATA:  Bilateral leg pain and weakness x4 days. EXAM: LEFT KNEE - COMPLETE 4+ VIEW COMPARISON:  None.  FINDINGS: A left knee replacement is seen without evidence of surrounding lucency to suggest the presence of hardware loosening or infection. No evidence of an acute fracture or dislocation. There is moderate severity vascular calcification. A small joint effusion is noted. IMPRESSION: 1. Left knee replacement without evidence of hardware loosening or infection. 2. Small joint effusion. Electronically Signed   By: Virgina Norfolk M.D.   On: 04/09/2020 18:10   DG Hip Unilat W or Wo Pelvis 2-3 Views Left  Result Date: 04/09/2020 CLINICAL DATA:  Bilateral leg pain and weakness x4 days. EXAM: DG HIP (WITH OR WITHOUT PELVIS) 2-3V LEFT COMPARISON:  October 27, 2004 FINDINGS: There is no evidence of hip fracture or dislocation. Mild degenerative changes are seen in the form of joint space narrowing and acetabular sclerosis. Marked severity vascular calcification is noted. IMPRESSION: No acute osseous abnormality. Electronically Signed   By: Virgina Norfolk M.D.   On: 04/09/2020 18:08    Pending Labs [employer] (From admission, onward)          Start     Ordered  04/10/20 0500  Hemoglobin A1c  (Labs)  Tomorrow morning,   R        04/09/20 2100   04/10/20 0500  Lipid panel  (Labs)  Tomorrow morning,   R       Comments: Fasting    04/09/20 2100   04/09/20 2201  Resp Panel by RT-PCR (Flu A&B, Covid) Nasopharyngeal Swab  (Tier 2 - Symptomatic/asymptomatic with Precautions )  Once,   STAT       Question Answer Comment  Is this test for diagnosis or screening Screening   Symptomatic for COVID-19 as defined by CDC No   Hospitalized for COVID-19 No   Admitted to ICU for COVID-19 No   Previously tested for COVID-19 Yes   Resident in a congregate (group) care setting No   Employed in healthcare setting No   Pregnant No   Has patient completed COVID vaccination(s) (2 doses of Pfizer/Moderna 1 dose of [employer]) Unknown      04/09/20 2202   04/09/20 2044  SARS CORONAVIRUS 2 (TAT 6-24  HRS) Nasopharyngeal Nasopharyngeal Swab  (Tier 3 - Symptomatic/asymptomatic with Precautions)  Once,   STAT       Question Answer Comment  Is this test for diagnosis or screening Screening   Symptomatic for COVID-19 as defined by CDC No   Hospitalized for COVID-19 No   Admitted to ICU for COVID-19 No   Previously tested for COVID-19 Yes   Resident in a congregate (group) care setting No   Employed in healthcare setting No   Pregnant No   Has patient completed COVID vaccination(s) (2 doses of Pfizer/Moderna 1 dose of [employer]) Yes   Has patient completed COVID Booster / 3rd dose Yes      04/09/20 2044   04/09/20 1644  Lactic acid, plasma  Now then every 2 hours,   STAT      04/09/20 1643   04/09/20 1643  Urine culture  ONCE - STAT,   STAT        04/09/20 1643          Vitals/Pain Today's Vitals   04/09/20 1830 04/09/20 1915 04/09/20 2003 04/09/20 2030  BP: (!) 144/69 (!) 151/75 (!) 143/55 (!) 145/74  Pulse: (!) 59 60 60 60  Resp: (!) 23 (!) 21 (!) 23 15  Temp:      TempSrc:      SpO2: 100% 100% 94% 98%  Weight:      Height:      PainSc:        Isolation Precautions Airborne and Contact precautions  Medications Medications   stroke: mapping our early stages of recovery book (has no administration in time range)  acetaminophen (TYLENOL) tablet 650 mg (has no administration in time range)    Or  acetaminophen (TYLENOL) 160 MG/5ML solution 650 mg (has no administration in time range)    Or  acetaminophen (TYLENOL) suppository 650 mg (has no administration in time range)  enoxaparin (LOVENOX) injection 60 mg (has no administration in time range)  albuterol (VENTOLIN HFA) 108 (90 Base) MCG/ACT inhaler 2 puff (has no administration in time range)  mometasone-formoterol (DULERA) 100-5 MCG/ACT inhaler 2 puff (has no administration in time range)  busPIRone (BUSPAR) tablet 10 mg (has no administration in time range)  clopidogrel (PLAVIX) tablet 75 mg (has no  administration in time range)  vitamin B-12 (CYANOCOBALAMIN) tablet 100 mcg (has no administration in time range)  dicyclomine (BENTYL) tablet 20-40 mg (has no administration  in time range)  donepezil (ARICEPT) tablet 10 mg (has no administration in time range)  guaiFENesin (MUCINEX) 12 hr tablet 600 mg (has no administration in time range)  rosuvastatin (CRESTOR) tablet 20 mg (has no administration in time range)  polyethylene glycol (MIRALAX / GLYCOLAX) packet 17 g (has no administration in time range)  darifenacin (ENABLEX) 24 hr tablet 7.5 mg (has no administration in time range)  pantoprazole (PROTONIX) EC tablet 40 mg (has no administration in time range)  Align CAPS 4 mg (has no administration in time range)  nortriptyline (PAMELOR) capsule 50 mg (has no administration in time range)  levothyroxine (SYNTHROID) tablet 100 mcg (has no administration in time range)  linaclotide (LINZESS) capsule 290 mcg (has no administration in time range)  insulin aspart (novoLOG) injection 0-15 Units (has no administration in time range)  insulin NPH Human (NOVOLIN N) injection 20 Units (has no administration in time range)  insulin aspart (novoLOG) injection 0-5 Units (has no administration in time range)  clonazePAM (KLONOPIN) tablet 1 mg (has no administration in time range)    And  clonazePAM (KLONOPIN) tablet 0.5 mg (has no administration in time range)    Mobility non-ambulatory

## 2020-04-09 NOTE — H&P (Addendum)
History and Physical    Tara Jackson FAO:[id number] DOB: 1940-05-25 DOA: 04/09/2020  PCP: Creola Corn, MD  Patient coming from: Home  I have personally briefly reviewed patient's old medical records in [facility]  Chief Complaint: Leg pain, weakness  HPI: Tara Jackson is a 80 y.o. female with medical history significant of prior strokes, CKD 3, DM2, PPM.  Pt presents to ED with BLE pain and weakness for past 1 week.  Intermittent headache for past 4 days.  On Plavix.  L leg started hurting a few days ago, progressed to right leg.  No longer able to stand on her own.  Multiple falls at home.  No saddle anesthesia, urinary retention, nor incontinence.   ED Course: work up in ED unremarkable thus far.  EDP wants admit for MRI as pt has PPM this will need to be done during day at [facility].   Review of Systems: As per HPI, otherwise all review of systems negative.  Past Medical History:  Diagnosis Date  . Anemia   . Anxiety   . Arthritis   . Asthma   . Brittle bone disease   . CKD (chronic kidney disease)   . Complication of anesthesia    hard to wake up after anesthesia, trouble turning head  . Coronary artery disease, non-occlusive 2014   a. minimal CAD by cath in 2014 with 40% RI stenosis. b. low-risk NST in 11/2014.  Marland Kitchen Depression   . Diabetes mellitus type 2, insulin dependent (HCC)   . Diabetic neuropathy (HCC)   . Diabetic retinopathy (HCC)    NPDR OU  . Diastolic dysfunction, left ventricle 11/30/14   EF 55%, grade 1 DD  . DVT (deep venous thrombosis) (HCC)    "18 in RLE; 7 LLE prior to PE" (03/24/2015)  . Family history of adverse reaction to anesthesia    " the whole family is hard to wake up"  . Glaucoma   . H/O hiatal hernia   . Hyperlipidemia   . Hypertension associated with diabetes (HCC)   . Hypertensive retinopathy    OU  . Hypothyroidism   . Kidney stones    "passed them"  . Macular degeneration    Exu OU  . Memory loss 06/03/2014  . On home  oxygen therapy    "2L oxygen concentrator @ night"  . Osteoporosis   . Oxygen desaturation during sleep    wears 2 liters of oxygen at night   . Palpitations 35years ago   Cardionet monitor - revealed mostly normal sinus rhythm, sinus bradycardia with first-degree A-V block heart rates mostly in the 50s and 60s with some 70s. No arrhythmias, PVCs or PACs noted.  . PE (pulmonary thromboembolism) (HCC) x 3, last one was ~ 2003   history of recurrent RLE DVT with PE - last PE ~>13 yrs; Maintained on Plavix  . Pneumonia   . Presence of permanent cardiac pacemaker   . Restless legs syndrome   . Sleep apnea    cpap disontinued; test done 07/14/2008 ordered per  Byrum  . Spinal headache   . Spinal stenosis    L 3, L 4 and L 5, and C 1, C 2 and C3  . Stroke [facility]) 04-19-2013   tia and stroke. Weakness Rt hand  . 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 s/p [employer] PPM in 03/2015 by Dr. Ladona Ridgel secondary to sinus node dysfunction.   Marland Kitchen TIA (transient ischemic attack) March 2014    Past  Surgical History:  Procedure Laterality Date  . ABDOMINAL HYSTERECTOMY    . APPENDECTOMY    . BACK SURGERY     steroid inj  . CATARACT EXTRACTION Bilateral   . CATARACT EXTRACTION W/ INTRAOCULAR LENS  IMPLANT, BILATERAL Bilateral 2000s  . CHOLECYSTECTOMY    . EP IMPLANTABLE DEVICE N/A 03/24/2015   [facility] [employer] PPM, Dr. Ladona Ridgel  . ESOPHAGOGASTRODUODENOSCOPY  08/09/2011   Procedure: ESOPHAGOGASTRODUODENOSCOPY (EGD);  Surgeon: Petra Kuba, MD;  Location: [facility] ENDOSCOPY;  Service: Endoscopy;  Laterality: N/A;  . ESOPHAGOGASTRODUODENOSCOPY (EGD) WITH PROPOFOL N/A 11/12/2013   Procedure: ESOPHAGOGASTRODUODENOSCOPY (EGD) WITH PROPOFOL;  Surgeon: Petra Kuba, MD;  Location: [facility] ENDOSCOPY;  Service: Endoscopy;  Laterality: N/A;  . ESOPHAGOGASTRODUODENOSCOPY (EGD) WITH PROPOFOL N/A 05/05/2016   Procedure: ESOPHAGOGASTRODUODENOSCOPY (EGD) WITH PROPOFOL;  Surgeon: Vida Rigger, MD;  Location: [facility] ENDOSCOPY;   Service: Endoscopy;  Laterality: N/A;  . EYE SURGERY Bilateral    Cat Sx OU  . GLAUCOMA SURGERY Bilateral 2000s   "laser"  . HOT HEMOSTASIS  08/09/2011   Procedure: HOT HEMOSTASIS (ARGON PLASMA COAGULATION/BICAP);  Surgeon: Petra Kuba, MD;  Location: [facility] ENDOSCOPY;  Service: Endoscopy;  Laterality: N/A;  . HOT HEMOSTASIS N/A 11/12/2013   Procedure: HOT HEMOSTASIS (ARGON PLASMA COAGULATION/BICAP);  Surgeon: Petra Kuba, MD;  Location: [facility] ENDOSCOPY;  Service: Endoscopy;  Laterality: N/A;  . INSERT / REPLACE / REMOVE PACEMAKER  03/24/2015  . IRIDOTOMY / IRIDECTOMY Bilateral   . JOINT REPLACEMENT    . KNEE SURGERY Left done with 2nd left knee replacement   spacer bar placement  . LEFT HEART CATHETERIZATION WITH CORONARY ANGIOGRAM N/A 02/14/2012   Procedure: LEFT HEART CATHETERIZATION WITH CORONARY ANGIOGRAM;  Surgeon: Marykay Lex, MD;  Location: [facility] CATH LAB;  Service: Cardiovascular:: no evidence of obstructive coronary disease ,low EDP with normal EF  . LEV DOPPLER  05/29/2012   RIGHT EXTREM. NORMAL VENOUS DUPLEX  . NM MYOCAR PERF WALL MOTION  08/2019   EF 55 to 65%.  No ischemia or infarction.  LOW RISK:  . PACEMAKER PLACEMENT    . REVISION TOTAL KNEE ARTHROPLASTY Left   . TONSILLECTOMY    . TOTAL KNEE ARTHROPLASTY Bilateral   . TRANSTHORACIC ECHOCARDIOGRAM  11/2017   11/2017: EF 60-65%. Gr1 DD. PAP ~33 mmHg.  Mild TR. PPM leads in RA & RV.   Marland Kitchen TRIGGER FINGER RELEASE  11/22/2011   Procedure: RELEASE TRIGGER FINGER/A-1 PULLEY;  Surgeon: Cammy Copa, MD;  Location: [facility] OR;  Service: Orthopedics;  Laterality: Left;  Left trigger thumb release  . TRIGGER FINGER RELEASE Right yrs ago     reports that she is a non-smoker but has been exposed to tobacco smoke. She has never used smokeless tobacco. She reports that she does not drink alcohol and does not use drugs.  Allergies  Allergen Reactions  . Iodine Other (See Comments)    ONLY IV--Makes unconscious  . Iohexol Other (See  Comments)     Code: SOB, Desc: cardiac arrest w/ iv contrast, has never used 13 hr prep//alice calhoun, Onset Date: [id number]   . Metoclopramide Hcl Other (See Comments)    suicidal  . Sulfa Antibiotics Hives  . Lactose Intolerance (Gi) Diarrhea  . Lansoprazole Nausea And Vomiting    Family History  Problem Relation Age of Onset  . Cancer Mother   . Diabetes Mother   . Cancer - Prostate Father   . Diabetes Father   . Retinal detachment Son   . Diabetes Sister   .  Diabetes Brother   . Diabetes Paternal Grandmother      Prior to Admission medications   Medication Sig Start Date End Date Taking? Authorizing Provider  albuterol (VENTOLIN HFA) 108 (90 Base) MCG/ACT inhaler INHALE 2 INHALATIONS 3 TIMES A DAY AS NEEDED FOR COUGH/WHEEZE 12/12/19  Yes Nyoka Cowden, MD  budesonide-formoterol (SYMBICORT) 80-4.5 MCG/ACT inhaler Inhale 2 puffs into the lungs 2 (two) times daily.   Yes [provider]  busPIRone (BUSPAR) 10 MG tablet Take 1 tablet (10 mg total) by mouth every morning. 06/03/14  Yes Micki Riley, MD  Cholecalciferol (VITAMIN D-3 PO) Take 2 tablets by mouth every morning.    Yes [provider]  clonazePAM (KLONOPIN) 0.5 MG tablet Take 0.5-1 mg by mouth See admin instructions. Takes 1mg  in am and 0.5mg  in pm.   Yes [provider]  clopidogrel (PLAVIX) 75 MG tablet Take 75 mg by mouth every morning.   Yes [provider]  cyanocobalamin 100 MCG tablet Take 100 mcg by mouth daily.   Yes [provider]  dicyclomine (BENTYL) 20 MG tablet Take 20-40 mg by mouth in the morning and at bedtime. Take 2 tablets (40 mg) in the morning and Take 1 tablet (20 mg) at bedtime   Yes [provider]  donepezil (ARICEPT) 10 MG tablet Take 1 tablet (10 mg total) by mouth at bedtime. 12/26/19  Yes McCue, Shanda Bumps, NP  furosemide (LASIX) 20 MG tablet Take 1 tablet (20 mg total) by mouth daily as needed for fluid or edema. As directed Patient  taking differently: Take 20 mg by mouth daily. As directed 01/21/20  Yes Marykay Lex, MD  glipiZIDE (GLUCOTROL) 10 MG tablet Take 20 mg by mouth 2 (two) times daily before a meal.  05/03/14  Yes [provider]  insulin NPH (HUMULIN N,NOVOLIN N) 100 UNIT/ML injection Inject 0-20 Units into the skin See admin instructions. Novolin-N Give 20 unit in morning; give  5 units at 4pm; evening dose as needed.  If less than 150 blood sugar= 0units.  If greater than 200 blood sugar= give 10units to decrease by 100 blood sugar   Yes [provider]  insulin regular (NOVOLIN R,HUMULIN R) 100 units/mL injection Inject 0-30 Units into the skin See admin instructions. Give 25 unit in morning; give 20units at 4pm; evening dose as needed.  If less than 150 blood sugar= 0units.  If greater than 200 blood sugar= give 10units to decrease by 100 blood sugar   Yes [provider]  levothyroxine (SYNTHROID) 100 MCG tablet Take 100 mcg by mouth daily before breakfast.   Yes [provider]  linaclotide (LINZESS) 290 MCG CAPS capsule Take 290 mcg by mouth daily before breakfast.   Yes [provider]  nortriptyline (PAMELOR) 50 MG capsule TAKE 2 CAPSULES (100MG ) AT BEDTIME Patient taking differently: Take 50 mg by mouth 2 (two) times daily. 06/02/16  Yes Micki Riley, MD  pantoprazole (PROTONIX) 40 MG tablet Take 1 tablet (40 mg total) by mouth daily. 06/19/19  Yes Kroeger, Dot Lanes M., PA-C  potassium chloride SA (K-DUR,KLOR-CON) 20 MEQ tablet Take 1 tablet (20 mEq total) by mouth 2 (two) times daily. 06/22/15  Yes Benjiman Core, MD  Probiotic Product (ALIGN) 4 MG CAPS Take 1 capsule by mouth daily.   Yes [provider]  rosuvastatin (CRESTOR) 20 MG tablet Take 20 mg by mouth daily.  03/13/17  Yes [provider]  solifenacin (VESICARE) 5 MG tablet Take 5  mg by mouth daily. 02/24/19  Yes [provider]  guaiFENesin (MUCINEX) 600 MG 12 hr tablet Take  600 mg by mouth 2 (two) times daily as needed for cough.    [provider]  lidocaine (LIDODERM) 5 % Place 3 patches onto the skin daily as needed (pain). Remove & Discard patch within 12 hours or as directed by MD    [provider]  Menthol-Methyl Salicylate (MUSCLE RUB) 10-15 % CREA Apply 1 application topically as needed for muscle pain.    [provider]  OXYGEN Inhale 2 L into the lungs as needed. Used at overnight with a Engineer, manufacturing, Historical, MD  oxymetazoline (AFRIN NASAL SPRAY) 0.05 % nasal spray Place 1 spray into both nostrils 2 (two) times daily as needed for congestion. 03/10/20   Glenford Bayley, NP  polyethylene glycol [facility] Hal) packet Take 17 g by mouth daily as needed for mild constipation.     [provider]  terconazole (TERAZOL 7) 0.4 % vaginal cream Place 1 applicator vaginally daily as needed for itching. 02/12/19   [provider]    Physical Exam: Vitals:   04/09/20 1830 04/09/20 1915 04/09/20 2003 04/09/20 2030  BP: (!) 144/69 (!) 151/75 (!) 143/55 (!) 145/74  Pulse: (!) 59 60 60 60  Resp: (!) 23 (!) 21 (!) 23 15  Temp:      TempSrc:      SpO2: 100% 100% 94% 98%  Weight:      Height:        Constitutional: NAD, calm, comfortable Eyes: PERRL, lids and conjunctivae normal ENMT: Mucous membranes are moist. Posterior pharynx clear of any exudate or lesions.Normal dentition.  Neck: normal, supple, no masses, no thyromegaly Respiratory: clear to auscultation bilaterally, no wheezing, no crackles. Normal respiratory effort. No accessory muscle use.  Cardiovascular: Regular rate and rhythm, no murmurs / rubs / gallops. No extremity edema. 2+ pedal pulses. No carotid bruits.  Abdomen: no tenderness, no masses palpated. No hepatosplenomegaly. Bowel sounds positive.  Musculoskeletal: no clubbing / cyanosis. No joint deformity upper and lower extremities. Good ROM, no contractures. Normal muscle tone.   Skin: no rashes, lesions, ulcers. No induration Neurologic: 4/5 strength BLE. Psychiatric: Normal judgment and insight. Alert and oriented x 3. Normal mood.    Labs on Admission: I have personally reviewed following labs and imaging studies  CBC: Recent Labs  Lab 04/09/20 1715  WBC 6.4  NEUTROABS 4.5  HGB 12.2  HCT 39.1  MCV 100.0  PLT 210   Basic Metabolic Panel: Recent Labs  Lab 04/09/20 1715  NA 138  K 4.0  CL 102  CO2 25  GLUCOSE 227*  BUN 18  CREATININE 1.64*  CALCIUM 9.4  MG 1.9   GFR: Estimated Creatinine Clearance: 35.7 mL/min (A) (by C-G formula based on SCr of 1.64 mg/dL (H)). Liver Function Tests: Recent Labs  Lab 04/09/20 1715  AST 20  ALT 13  ALKPHOS 63  BILITOT 0.6  PROT 7.1  ALBUMIN 3.7   No results for input(s): LIPASE, AMYLASE in the last 168 hours. No results for input(s): AMMONIA in the last 168 hours. Coagulation Profile: No results for input(s): INR, PROTIME in the last 168 hours. Cardiac Enzymes: No results for input(s): CKTOTAL, CKMB, CKMBINDEX, TROPONINI in the last 168 hours. BNP (last 3 results) Recent Labs    07/31/19 1226  PROBNP 30.0   HbA1C: No results for input(s): HGBA1C in the last 72 hours. CBG: Recent  Labs  Lab 04/09/20 1920  GLUCAP 128*   Lipid Profile: No results for input(s): CHOL, HDL, LDLCALC, TRIG, CHOLHDL, LDLDIRECT in the last 72 hours. Thyroid Function Tests: No results for input(s): TSH, T4TOTAL, FREET4, T3FREE, THYROIDAB in the last 72 hours. Anemia Panel: No results for input(s): VITAMINB12, FOLATE, FERRITIN, TIBC, IRON, RETICCTPCT in the last 72 hours. Urine analysis:    Component Value Date/Time   COLORURINE YELLOW 04/09/2020 1947   APPEARANCEUR CLEAR 04/09/2020 1947   LABSPEC 1.013 04/09/2020 1947   PHURINE 5.0 04/09/2020 1947   GLUCOSEU NEGATIVE 04/09/2020 1947   HGBUR NEGATIVE 04/09/2020 1947   BILIRUBINUR NEGATIVE 04/09/2020 1947   KETONESUR NEGATIVE 04/09/2020 1947   PROTEINUR  NEGATIVE 04/09/2020 1947   UROBILINOGEN 1.0 11/28/2014 1125   NITRITE NEGATIVE 04/09/2020 1947   LEUKOCYTESUR NEGATIVE 04/09/2020 1947    Radiological Exams on Admission: CT Head Wo Contrast  Result Date: 04/09/2020 CLINICAL DATA:  Headache and frequent falls. EXAM: CT HEAD WITHOUT CONTRAST TECHNIQUE: Contiguous axial images were obtained from the base of the skull through the vertex without intravenous contrast. COMPARISON:  March 19, 2019 FINDINGS: Brain: There is mild cerebral atrophy with widening of the extra-axial spaces and ventricular dilatation. There are areas of decreased attenuation within the white matter tracts of the supratentorial brain, consistent with microvascular disease changes. Vascular: No hyperdense vessel or unexpected calcification. Skull: Negative for an acute fracture or focal lesion. Sinuses/Orbits: There is marked severity right maxillary sinus mucosal thickening. Other: None. IMPRESSION: 1. No acute intracranial abnormality. 2. 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 3. Marked severity right maxillary sinus disease. Electronically Signed   By: Aram Candela M.D.   On: 04/09/2020 18:24   CT Cervical Spine Wo Contrast  Result Date: 04/09/2020 CLINICAL DATA:  Bilateral leg pain and weakness with frequent falls. EXAM: CT CERVICAL SPINE WITHOUT CONTRAST TECHNIQUE: Multidetector CT imaging of the cervical spine was performed without intravenous contrast. Multiplanar CT image reconstructions were also generated. COMPARISON:  March 19, 2019 FINDINGS: Alignment: Normal. Skull base and vertebrae: No acute fracture. No primary bone lesion or focal pathologic process. Soft tissues and spinal canal: No prevertebral fluid or swelling. No visible canal hematoma. Disc levels: Moderate severity endplate sclerosis is seen at the levels of C3-C4 and C5-C6. Mild endplate sclerosis is seen at the level of C6-C7. There is marked severity narrowing  of the anterior atlantoaxial articulation. Moderate severity intervertebral disc space narrowing is seen at the levels of C3-C4 and C5-C6. Mild to moderate severity bilateral multilevel facet joint hypertrophy is noted. Upper chest: Negative. Other: There is marked severity right maxillary sinus mucosal thickening. IMPRESSION: 1. No acute osseous abnormality. 2. Moderate severity multilevel degenerative changes, most prominent at the levels of C3-C4 and C5-C6. 3. Marked severity right maxillary sinus disease. Electronically Signed   By: Aram Candela M.D.   On: 04/09/2020 18:26   DG Knee Complete 4 Views Left  Result Date: 04/09/2020 CLINICAL DATA:  Bilateral leg pain and weakness x4 days. EXAM: LEFT KNEE - COMPLETE 4+ VIEW COMPARISON:  None. FINDINGS: A left knee replacement is seen without evidence of surrounding lucency to suggest the presence of hardware loosening or infection. No evidence of an acute fracture or dislocation. There is moderate severity vascular calcification. A small joint effusion is noted. IMPRESSION: 1. Left knee replacement without evidence of hardware loosening or infection. 2. Small joint effusion. Electronically Signed   By: Aram Candela M.D.   On: 04/09/2020 18:10  DG Hip Unilat W or Wo Pelvis 2-3 Views Left  Result Date: 04/09/2020 CLINICAL DATA:  Bilateral leg pain and weakness x4 days. EXAM: DG HIP (WITH OR WITHOUT PELVIS) 2-3V LEFT COMPARISON:  October 27, 2004 FINDINGS: There is no evidence of hip fracture or dislocation. Mild degenerative changes are seen in the form of joint space narrowing and acetabular sclerosis. Marked severity vascular calcification is noted. IMPRESSION: No acute osseous abnormality. Electronically Signed   By: Aram Candela M.D.   On: 04/09/2020 18:08    EKG: Independently reviewed.  Assessment/Plan Principal Problem:   Lower extremity weakness Active Problems:   Essential hypertension   Type 2 diabetes mellitus with renal  manifestations (HCC)   CKD stage 3 due to type 2 diabetes mellitus (HCC)   Pacemaker    1. LE weakness - 1. R/o stroke 2. Stroke pathway 3. MRI brain and L spine 4. PT/OT/SLP 5. Order remainder of stroke work up if stroke positive. 2. DM2 - 1. Hold home meds 2. NPH 20u QAC breakfast 3. Mod scale SSI AC/HS 3. CKD 3 - 1. Chronic and stable 4. HTN - 1. Not on any HTN meds at baseline it appears  DVT prophylaxis: Lovenox Code Status: Full Family Communication: Husband at bedside Disposition Plan: Home after admit Consults called: None Admission status: Place in 39  Harrold Fitchett M. DO [facility]  How to contact the [facility] Attending or Consulting provider 7A - 7P or covering provider during after hours 7P -7A, for this patient?  1. Check the care team in [facility] and look for a) attending/consulting [facility] provider listed and b) the [facility] team listed 2. Log into [email or web address]  Amion Physician Scheduling and messaging for groups and whole hospitals  On call and physician scheduling software for group practices, residents, hospitalists and other medical providers for call, clinic, rotation and shift schedules. On[employer] is a hospital-wide system for scheduling doctors and paging doctors on call. EasyPlot is for scientific plotting and data analysis.  [email or web address]  and use [place]'s universal password to access. If you do not have the password, please contact the hospital operator.  3. Locate the [facility] provider you are looking for under [facility] and page to a number that you can be directly reached. 4. If you still have difficulty reaching the provider, please page the [facility] (Director on Call) for the Hospitalists listed on amion for assistance.  04/09/2020, 9:29 PM

## 2020-04-09 NOTE — ED Notes (Signed)
[facility] notified for pt transfer 

## 2020-04-09 NOTE — Progress Notes (Signed)
Spoke with RN & PA. Patient has pacemaker and will need to be transferred to [facility] for her MRI.

## 2020-04-09 NOTE — ED Provider Notes (Signed)
[facility] DEPT Provider Note   CSN: [id number] Arrival date & time: 04/09/20  1548     History Chief Complaint  Patient presents with  . Leg Pain  . Headache  . Fall    Tara Jackson is a 80 y.o. female who presents to the emergency department with concern for progressively worsening bilateral leg pain and weakness x1 week.  She also endorses intermittent headache for the last 4 days.  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  Most recent CVA was approximately 12 years ago.  She states that her left leg started hurting a few days ago and progressively her right leg began hurting and she feels like she is weak in her lower extremities.  She states she is no longer able to stand on her own, and has had multiple falls at home, but she states is not new for her.  Her husband is at the bedside. Denies any saddle anesthesia, urinary retention or incontinence.  He states that she had a fall today while trying to move from bed to the chair.  She states that she hit her head at that time and has had a headache since that time, but denies any blurry vision, or new double vision.  She denies any difficulty speaking, and her husband states she has been behaving normally for her.  I personally reviewed this patient's medical records.  She history of hyperlipidemia, hypertension, type 2 diabetes, CKD, CVAs, history of PE and DVT, history of spinal stenosis at L3-5. Patient has a pacemaker in place.   HPI     Past Medical History:  Diagnosis Date  . Anemia   . Anxiety   . Arthritis   . Asthma   . Brittle bone disease   . CKD (chronic kidney disease)   . Complication of anesthesia    hard to wake up after anesthesia, trouble turning head  . Coronary artery disease, non-occlusive 2014   a. minimal CAD by cath in 2014 with 40% RI stenosis. b. low-risk NST in 11/2014.  Marland Kitchen Depression   .  Diabetes mellitus type 2, insulin dependent ([place])   . Diabetic neuropathy ([place])   . Diabetic retinopathy ([place])    NPDR OU  . Diastolic dysfunction, left ventricle 11/30/14   EF 55%, grade 1 DD  . DVT (deep venous thrombosis) ([place])    "18 in RLE; 7 LLE prior to PE" (03/24/2015)  . Family history of adverse reaction to anesthesia    " the whole family is hard to wake up"  . Glaucoma   . H/O hiatal hernia   . Hyperlipidemia   . Hypertension associated with diabetes ([place])   . Hypertensive retinopathy    OU  . Hypothyroidism   . Kidney stones    "passed them"  . Macular degeneration    Exu OU  . Memory loss 06/03/2014  . On home oxygen therapy    "2L oxygen concentrator @ night"  . Osteoporosis   . Oxygen desaturation during sleep    wears 2 liters of oxygen at night   . Palpitations 35years ago   Cardionet monitor - revealed mostly normal sinus rhythm, sinus bradycardia with first-degree A-V block heart rates mostly in the 50s and 60s with some 70s. No arrhythmias, PVCs or PACs noted.  . PE (pulmonary thromboembolism) ([place]) x 3, last one was ~ 2003   history of recurrent RLE  DVT with PE - last PE ~>13 yrs; Maintained on Plavix  . Pneumonia   . Presence of permanent cardiac pacemaker   . Restless legs syndrome   . Sleep apnea    cpap disontinued; test done 07/14/2008 ordered per  [place]  . Spinal headache   . Spinal stenosis    L 3, L 4 and L 5, and C 1, C 2 and C3  . Stroke [facility]) 04-19-2013   tia and stroke. Weakness Rt hand  . 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 s/p [employer] PPM in 03/2015 by Dr. Lovena Le secondary to sinus node dysfunction.   Marland Kitchen TIA (transient ischemic attack) March 2014    Patient Active Problem List   Diagnosis Date Noted  . Dry mouth 08/12/2019  . Chronic respiratory failure with hypoxia ([place]) 07/26/2018  . Cough variant asthma vs uacs  07/25/2018  . Syncope due to orthostatic hypotension 09/12/2017  . Orthostatic dizziness 06/12/2017  .  Diabetic neuropathy ([place]) 12/13/2016  . 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 Acute renal failure superimposed on chronic kidney disease ([place]) 10/24/2016  . Neurocardiogenic syncope 10/23/2016  . Chronic anemia 10/11/2016  . Pacemaker 04/26/2015  . Chronic daily headache 04/07/2015  . Symptomatic bradycardia 03/24/2015  . Sinus node dysfunction ([place]) 03/24/2015  . Chronic diastolic heart failure ([place])   . Diabetes mellitus type 2 in obese ([place])   . Normal coronary arteries 01/14/2015  . Normal cardiac stress test 11/30/2014  . Bradycardia 11/28/2014  . Type 2 diabetes mellitus with renal manifestations ([place]) 11/28/2014  . Morbid obesity ([place]) 11/28/2014  . CKD stage 3 due to type 2 diabetes mellitus ([place]) 11/28/2014  . Memory loss 06/03/2014  . AKI (acute kidney injury) ([place])   . Chest pain 04/19/2014  . Pleuritic chest pain 04/19/2014  . Diabetes mellitus ([place]) 04/19/2014  . Edema of both legs 04/19/2014  . Dyspnea   . DOE (dyspnea on exertion)   . Tremor of right hand 09/20/2013  . Headache(784.0) 09/19/2013  . Restless legs syndrome (RLS) 09/19/2013  . Constipation due to pain medication 08/21/2013  . Ankle edema 08/21/2013  . PVC's (premature ventricular contractions) 02/10/2013  . Diastolic dysfunction, left ventricle   . 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 PULMONARY EMBOLISM, HX OF 10/02/2009  . Rhinitis 04/07/2009  . Sleep apnea 07/09/2008  . Hyperlipidemia with target LDL less than 130 05/20/2008  . Essential hypertension 05/20/2008    Past Surgical History:  Procedure Laterality Date  . ABDOMINAL HYSTERECTOMY    . APPENDECTOMY    . BACK SURGERY     steroid inj  . CATARACT EXTRACTION Bilateral   . CATARACT EXTRACTION W/ INTRAOCULAR LENS  IMPLANT, BILATERAL Bilateral 2000s  . CHOLECYSTECTOMY    . EP IMPLANTABLE DEVICE N/A 03/24/2015   [facility] [employer] PPM, Dr. Lovena Le  . ESOPHAGOGASTRODUODENOSCOPY  08/09/2011   Procedure: ESOPHAGOGASTRODUODENOSCOPY (EGD);   Surgeon: Jeryl Columbia, MD;  Location: [facility];  Service: Endoscopy;  Laterality: N/A;  . ESOPHAGOGASTRODUODENOSCOPY (EGD) WITH PROPOFOL N/A 11/12/2013   Procedure: ESOPHAGOGASTRODUODENOSCOPY (EGD) WITH PROPOFOL;  Surgeon: Jeryl Columbia, MD;  Location: [facility] ENDOSCOPY;  Service: Endoscopy;  Laterality: N/A;  . ESOPHAGOGASTRODUODENOSCOPY (EGD) WITH PROPOFOL N/A 05/05/2016   Procedure: ESOPHAGOGASTRODUODENOSCOPY (EGD) WITH PROPOFOL;  Surgeon: Clarene Essex, MD;  Location: [facility] ENDOSCOPY;  Service: Endoscopy;  Laterality: N/A;  . EYE SURGERY Bilateral    Cat Sx OU  . GLAUCOMA SURGERY Bilateral 2000s   "laser"  . HOT HEMOSTASIS  08/09/2011   Procedure:  HOT HEMOSTASIS (ARGON PLASMA COAGULATION/BICAP);  Surgeon: Jeryl Columbia, MD;  Location: [facility];  Service: Endoscopy;  Laterality: N/A;  . HOT HEMOSTASIS N/A 11/12/2013   Procedure: HOT HEMOSTASIS (ARGON PLASMA COAGULATION/BICAP);  Surgeon: Jeryl Columbia, MD;  Location: [facility];  Service: Endoscopy;  Laterality: N/A;  . INSERT / REPLACE / REMOVE PACEMAKER  03/24/2015  . IRIDOTOMY / IRIDECTOMY Bilateral   . JOINT REPLACEMENT    . KNEE SURGERY Left done with 2nd left knee replacement   spacer bar placement  . LEFT HEART CATHETERIZATION WITH CORONARY ANGIOGRAM N/A 02/14/2012   Procedure: LEFT HEART CATHETERIZATION WITH CORONARY ANGIOGRAM;  Surgeon: Leonie Man, MD;  Location: [facility] CATH LAB;  Service: Cardiovascular:: no evidence of obstructive coronary disease ,low EDP with normal EF  . LEV DOPPLER  05/29/2012   RIGHT EXTREM. NORMAL VENOUS DUPLEX  . NM MYOCAR PERF WALL MOTION  08/2019   EF 55 to 65%.  No ischemia or infarction.  LOW RISK:  . PACEMAKER PLACEMENT    . REVISION TOTAL KNEE ARTHROPLASTY Left   . TONSILLECTOMY    . TOTAL KNEE ARTHROPLASTY Bilateral   . TRANSTHORACIC ECHOCARDIOGRAM  11/2017   11/2017: EF 60-65%. Gr1 DD. PAP ~33 mmHg.  Mild TR. PPM leads in RA & RV.   Marland Kitchen TRIGGER FINGER RELEASE  11/22/2011   Procedure: RELEASE TRIGGER  FINGER/A-1 PULLEY;  Surgeon: Meredith Pel, MD;  Location: [facility];  Service: Orthopedics;  Laterality: Left;  Left trigger thumb release  . TRIGGER FINGER RELEASE Right yrs ago     OB History   No obstetric history on file.     Family History  Problem Relation Age of Onset  . Cancer Mother   . Diabetes Mother   . Cancer - Prostate Father   . Diabetes Father   . Retinal detachment Son   . Diabetes Sister   . Diabetes Brother   . Diabetes Paternal Grandmother     Social History   Tobacco Use  . Smoking status: Passive Smoke Exposure - Never Smoker  . Smokeless tobacco: Never Used  Vaping Use  . Vaping Use: Never used  Substance Use Topics  . Alcohol use: No    Alcohol/week: 0.0 standard drinks  . Drug use: No    Home Medications Prior to Admission medications   Medication Sig Start Date End Date Taking? Authorizing Provider  acetaminophen (TYLENOL) 650 MG CR tablet Take 650 mg by mouth every 8 (eight) hours as needed for pain.    [provider]  albuterol (VENTOLIN HFA) 108 (90 Base) MCG/ACT inhaler INHALE 2 INHALATIONS 3 TIMES A DAY AS NEEDED FOR COUGH/WHEEZE 12/12/19   Tanda Rockers, MD  budesonide-formoterol (SYMBICORT) 80-4.5 MCG/ACT inhaler Inhale 2 puffs into the lungs 2 (two) times daily.    [provider]  busPIRone (BUSPAR) 10 MG tablet Take 1 tablet (10 mg total) by mouth every morning. 06/03/14   Garvin Fila, MD  Cholecalciferol (VITAMIN D-3 PO) Take 2 tablets by mouth every morning.     [provider]  clonazePAM (KLONOPIN) 0.5 MG tablet Take 0.5-1 mg by mouth See admin instructions. Takes 1mg  in am and 0.5mg  in pm.    [provider]  clopidogrel (PLAVIX) 75 MG tablet Take 75 mg by mouth every morning.    [provider]  cyanocobalamin 100 MCG tablet Take 100 mcg by mouth daily.    [provider]  dicyclomine (BENTYL) 10 MG capsule Take 1 capsule (  10 mg total) by mouth 3 (three) times daily  before meals. Patient taking differently: Take 10 mg by mouth in the morning and at bedtime. 09/14/17   Geradine Girt, DO  donepezil (ARICEPT) 10 MG tablet Take 1 tablet (10 mg total) by mouth at bedtime. 12/26/19   Frann Rider, NP  esomeprazole (NEXIUM) 40 MG capsule Take 40 mg by mouth daily. 03/10/20   [provider]  famotidine (PEPCID) 20 MG tablet TAKE 1 TABLET BY MOUTH EVERY DAY AFTER SUPPER 08/19/19   Tanda Rockers, MD  furosemide (LASIX) 20 MG tablet Take 1 tablet (20 mg total) by mouth daily as needed for fluid or edema. As directed 01/21/20   Leonie Man, MD  glipiZIDE (GLUCOTROL) 10 MG tablet Take 20 mg by mouth 2 (two) times daily before a meal.  05/03/14   [provider]  guaiFENesin (MUCINEX) 600 MG 12 hr tablet Take 600 mg by mouth 2 (two) times daily as needed for cough.    [provider]  insulin NPH (HUMULIN N,NOVOLIN N) 100 UNIT/ML injection Inject 0-20 Units into the skin See admin instructions. Novolin-N Give 20 unit in morning; give  5 units at 4pm; evening dose as needed.  If less than 150 blood sugar= 0units.  If greater than 200 blood sugar= give 10units to decrease by 100 blood sugar    [provider]  insulin regular (NOVOLIN R,HUMULIN R) 100 units/mL injection Inject 0-25 Units into the skin See admin instructions. Give 25 unit in morning; give 20units at 4pm; evening dose as needed.  If less than 150 blood sugar= 0units.  If greater than 200 blood sugar= give 10units to decrease by 100 blood sugar Patient not taking: Reported on 04/03/2020    [provider]  levothyroxine (SYNTHROID) 100 MCG tablet Take 100 mcg by mouth daily before breakfast.    [provider]  lidocaine (LIDODERM) 5 % Place 3 patches onto the skin daily as needed (pain). Remove & Discard patch within 12 hours or as directed by MD    [provider]  linaclotide (LINZESS) 290 MCG CAPS capsule Take 290 mcg by mouth daily before  breakfast.    [provider]  LORazepam (ATIVAN) 0.5 MG tablet Take 0.5 mg by mouth daily as needed for anxiety.    [provider]  Menthol-Methyl Salicylate (MUSCLE RUB) 10-15 % CREA Apply 1 application topically as needed for muscle pain.    [provider]  midodrine (PROAMATINE) 2.5 MG tablet Take 1 tablet (2.5 mg total) by mouth 3 (three) times daily with meals. Patient taking differently: Take 2.5 mg by mouth as needed. As needed if blood pressure goes to low. 10/19/17   Leonie Man, MD  nortriptyline (PAMELOR) 50 MG capsule TAKE 2 CAPSULES (100MG ) AT BEDTIME Patient taking differently: Take 50 mg by mouth 2 (two) times daily. 06/02/16   Garvin Fila, MD  ondansetron (ZOFRAN) 4 MG tablet Take 4 mg by mouth as needed for nausea or vomiting.  08/16/17   [provider]  OXYGEN Inhale 2 L into the lungs as needed. Used at overnight with a Cytogeneticist, Historical, MD  oxymetazoline (AFRIN NASAL SPRAY) 0.05 % nasal spray Place 1 spray into both nostrils 2 (two) times daily as needed for congestion. 03/10/20   Martyn Ehrich, NP  pantoprazole (PROTONIX) 40 MG tablet Take 1 tablet (40 mg total) by mouth daily. 06/19/19   Kroeger, Lorelee Cover., PA-C  polyethylene glycol (MIRALAX / GLYCOLAX) packet Take 17 g by mouth daily as needed for mild constipation.     [provider]  potassium chloride SA (K-DUR,KLOR-CON) 20 MEQ tablet Take 1 tablet (20 mEq total) by mouth 2 (two) times daily. 06/22/15   Davonna Belling, MD  Probiotic Product (ALIGN) 4 MG CAPS Take 1 capsule by mouth daily.    [provider]  propranolol (INDERAL) 60 MG tablet 1 tablet    [provider]  Pumpkin Seed-Soy Germ (AZO BLADDER CONTROL/GO-LESS PO) Take 1 tablet by mouth daily as needed (bladder control).    [provider]  rosuvastatin (CRESTOR) 20 MG tablet Take 20 mg by mouth daily.  03/13/17   [provider]  solifenacin  (VESICARE) 5 MG tablet Take 5 mg by mouth daily. 02/24/19   [provider]  terconazole (TERAZOL 7) 0.4 % vaginal cream Place 1 applicator vaginally daily as needed for itching. 02/12/19   [provider]    Allergies    Iodine, Iohexol, Metoclopramide hcl, Sulfa antibiotics, Lactose intolerance (gi), and Lansoprazole  Review of Systems   Review of Systems  Constitutional: Negative for activity change, appetite change, chills, diaphoresis, fatigue and fever.  HENT: Negative.   Respiratory: Negative.   Cardiovascular: Negative.   Gastrointestinal: Negative.   Genitourinary: Negative.   Musculoskeletal: Positive for arthralgias and myalgias. Negative for neck pain and neck stiffness.  Skin: Negative.   Neurological: Positive for weakness. Negative for dizziness, tremors, syncope, facial asymmetry, numbness and headaches.  Psychiatric/Behavioral: Negative.     Physical Exam Updated Vital Signs BP (!) 154/62   Pulse 60   Temp 98.2 F (36.8 C) (Oral)   Resp (!) 22   Ht 5' 7.5" (1.715 m)   Wt 115.7 kg   SpO2 98%   BMI 39.35 kg/m   Physical Exam Vitals and nursing note reviewed.  Constitutional:      Appearance: She is obese. She is not ill-appearing or toxic-appearing.  HENT:     Head: Normocephalic and atraumatic.     Nose: Nose normal.     Mouth/Throat:     Mouth: Mucous membranes are moist.     Dentition: Has dentures.     Pharynx: Oropharynx is clear. Uvula midline. No oropharyngeal exudate or posterior oropharyngeal erythema.     Tonsils: No tonsillar exudate.  Eyes:     General: Lids are normal. Vision grossly intact.        Right eye: No discharge.        Left eye: No discharge.     Extraocular Movements: Extraocular movements intact.     Conjunctiva/sclera: Conjunctivae normal.     Pupils: Pupils are equal, round, and reactive to light.  Neck:     Trachea: Trachea and phonation normal.  Cardiovascular:     Rate and Rhythm: Normal rate and  regular rhythm.     Pulses:          Radial pulses are 2+ on the right side and 2+ on the left side.       Dorsalis pedis pulses are 1+ on the right side and 1+ on the left side.     Heart sounds: Murmur heard.   Systolic murmur is present with a grade of 2/6.   Pulmonary:     Effort: Pulmonary effort is normal. No respiratory distress.     Breath sounds: Normal breath sounds. No wheezing or rales.  Chest:     Chest wall: No lacerations, deformity,  swelling, tenderness or crepitus.  Abdominal:     General: Bowel sounds are normal. There is no distension.     Palpations: Abdomen is soft.     Tenderness: There is abdominal tenderness in the suprapubic area. There is no right CVA tenderness, left CVA tenderness, guarding or rebound.  Musculoskeletal:        General: No swelling or deformity. Normal range of motion.     Right shoulder: Normal.     Left shoulder: Normal.     Right upper arm: Normal.     Left upper arm: Normal.     Right elbow: Normal.     Left elbow: Normal.     Right forearm: Normal.     Left forearm: Normal.     Right wrist: Normal.     Left wrist: Normal.     Right hand: Normal.     Left hand: Normal.     Cervical back: Normal range of motion and neck supple. Tenderness and bony tenderness present. No rigidity, spasms or crepitus. Spinous process tenderness and muscular tenderness present. No pain with movement. Normal range of motion.     Thoracic back: Normal. No bony tenderness.     Lumbar back: Normal. No bony tenderness.     Right hip: Normal.     Left hip: Tenderness and bony tenderness present. No crepitus.     Right upper leg: Normal.     Left upper leg: Normal.     Right knee: Normal.     Left knee: Bony tenderness present. No swelling or deformity. Tenderness present.     Right lower leg: 2+ Edema present.     Left lower leg: 2+ Edema present.     Right ankle: Normal.     Left ankle: Normal.     Right foot: Normal.     Left foot: Normal.      Comments: 4/5 grip strength bilaterally 4/5 plantar dorsiflexion bilaterally 4/5 strength elbow and flexion extension bilaterally  Lymphadenopathy:     Cervical: No cervical adenopathy.  Skin:    General: Skin is warm and dry.     Capillary Refill: Capillary refill takes less than 2 seconds.  Neurological:     General: No focal deficit present.     Mental Status: She is alert and oriented to person, place, and time. Mental status is at baseline.     Cranial Nerves: Cranial nerves are intact.     Sensory: Sensation is intact.     Motor: Motor function is intact.     Coordination: Coordination is intact.  Psychiatric:        Mood and Affect: Mood normal.     ED Results / Procedures / Treatments   Labs (all labs ordered are listed, but only abnormal results are displayed) Labs Reviewed  URINE CULTURE  CBC WITH DIFFERENTIAL/PLATELET  LACTIC ACID, PLASMA  URINALYSIS, ROUTINE W REFLEX MICROSCOPIC  COMPREHENSIVE METABOLIC PANEL  LACTIC ACID, PLASMA  MAGNESIUM  TROPONIN I (HIGH SENSITIVITY)    EKG None  Radiology No results found.  Procedures Procedures   Medications Ordered in ED Medications - No data to display  ED Course  I have reviewed the triage vital signs and the nursing notes.  Pertinent labs & imaging results that were available during my care of the patient were reviewed by me and considered in my medical decision making (see chart for details).    MDM Rules/Calculators/A&P  80 year old female who presents with concern of 1 week of progressive bilateral lower extremity pain and weakness.  The differential diagnosis of weakness includes but is not limited to: Marland Kitchen Neurologic causes: GBS, myasthenia gravis, CVA, MS, ALS, transverse myelitis, spinal cord injury, CVA, botulism . Other causes: ACS, Arrhythmia, syncope, orthostatic hypotension, sepsis, hypoglycemia, electrolyte disturbance, hypothyroidism, respiratory failure, symptomatic  anemia, dehydration, heat injury, polypharmacy, malignancy.  The emergent differential diagnosis for low back pain includes acute ligamentous and muscular injury, cord compression syndrome, pathologic fracture, transverse myelitis, vertebral osteomyelitis, discitis, and epidural abscess.  Hypertensive on intake and vital signs are normal.  Cardiac exam revealed known mild systolic murmur, pulmonary exam is unremarkable, abdominal exam is benign.  No focal deficit on neurologic exam. Will proceed with broad work-up given patient's vague symptoms of weakness.  Concerned that patient may need lumbar MRI given history of lumbar spinal stenosis and new bilateral leg pain and weakness, however patient has indwelling pacemaker therefore MRI would not be an option at this facility given patient is evaluated at [facility].  Care of this patient signed out to oncoming ED provider, Benedetto Goad, PA-C, at time of shift change.  All pertinent HPI and physical exam findings were reviewed with her prior to my departure.  No laboratory imaging studies were performed prior to my departure.  I appreciate her collaboration in the care of this patient.   This chart was dictated using voice recognition software, Dragon. Despite the best efforts of this provider to proofread and correct errors, errors may still occur which can change documentation meaning.  Final Clinical Impression(s) / ED Diagnoses Final diagnoses:  None    Rx / DC Orders ED Discharge Orders    None       Emeline Darling, PA-C 04/09/20 1809    Lorelle Gibbs, DO 04/09/20 2312

## 2020-04-09 NOTE — ED Notes (Signed)
Unable to provide urine specimen. External catheter in place. Declines in-and-out catheter.

## 2020-04-09 NOTE — ED Triage Notes (Signed)
Patient c/o bilateral leg pain and weakness x 4 days. Patient also c/o headache x 4 days.  Patient reports that she has had stroke x 2. Patient states that she fall frequently, but that is not unusual for her.

## 2020-04-10 DIAGNOSIS — F32A Depression, unspecified: Secondary | ICD-10-CM

## 2020-04-10 DIAGNOSIS — E785 Hyperlipidemia, unspecified: Secondary | ICD-10-CM

## 2020-04-10 DIAGNOSIS — R531 Weakness: Secondary | ICD-10-CM | POA: Diagnosis not present

## 2020-04-10 DIAGNOSIS — R29898 Other symptoms and signs involving the musculoskeletal system: Secondary | ICD-10-CM | POA: Diagnosis not present

## 2020-04-10 DIAGNOSIS — F419 Anxiety disorder, unspecified: Secondary | ICD-10-CM

## 2020-04-10 LAB — LIPID PANEL
Cholesterol: 119 mg/dL (ref 0–200)
HDL: 67 mg/dL (ref >40)
LDL Cholesterol: 38 mg/dL (ref 0–99)
Total CHOL/HDL Ratio: 1.8 RATIO
Triglycerides: 71 mg/dL (ref <150)
VLDL: 14 mg/dL (ref 0–40)

## 2020-04-10 LAB — TSH: TSH: 1.238 u[IU]/mL (ref 0.350–4.500)

## 2020-04-10 LAB — HEMOGLOBIN A1C
Hgb A1c MFr Bld: 8.4 % — ABNORMAL HIGH (ref 4.8–5.6)
Mean Plasma Glucose: 194.38 mg/dL

## 2020-04-10 LAB — GLUCOSE, CAPILLARY
Glucose-Capillary: 204 mg/dL — ABNORMAL HIGH (ref 70–99)
Glucose-Capillary: 239 mg/dL — ABNORMAL HIGH (ref 70–99)
Glucose-Capillary: 276 mg/dL — ABNORMAL HIGH (ref 70–99)
Glucose-Capillary: 145 mg/dL — ABNORMAL HIGH (ref 70–99)
Glucose-Capillary: 226 mg/dL — ABNORMAL HIGH (ref 70–99)

## 2020-04-10 LAB — SARS CORONAVIRUS 2 (TAT 6-24 HRS): SARS Coronavirus 2: NEGATIVE

## 2020-04-10 LAB — FOLATE: Folate: 32.4 ng/mL (ref >5.9)

## 2020-04-10 LAB — LACTIC ACID, PLASMA: Lactic Acid, Venous: 1 mmol/L (ref 0.5–1.9)

## 2020-04-10 LAB — VITAMIN B12: Vitamin B-12: 285 pg/mL (ref 180–914)

## 2020-04-10 MED ORDER — CYANOCOBALAMIN 1000 MCG/ML IJ SOLN
1000.0000 ug | Freq: Once | INTRAMUSCULAR | Status: DC
  Filled 2020-04-10: qty 1

## 2020-04-10 MED ORDER — CYANOCOBALAMIN 500 MCG PO TABS: 250.0000 ug | ORAL_TABLET | Freq: Every day | ORAL | Status: DC

## 2020-04-10 MED ORDER — CYANOCOBALAMIN 1000 MCG/ML IJ SOLN
1000.0000 ug | Freq: Every day | INTRAMUSCULAR | Status: DC
  Administered 2020-04-11: 1000 ug via SUBCUTANEOUS
  Filled 2020-04-10: qty 1

## 2020-04-10 NOTE — Assessment & Plan Note (Signed)
-  patient has history of CKD3b. Baseline creat ~ 1.4 - 1.6, eGFR 32-34 - creat 1.64 on admission

## 2020-04-10 NOTE — Assessment & Plan Note (Addendum)
-   currently at goal with no meds (SBP 140s - 150s)

## 2020-04-10 NOTE — Hospital Course (Addendum)
Tara Jackson is an 80 yo female with PMH CVA, spinal stenosis, RLS, osteoporosis, hypothyroidism, hypertension, hyperlipidemia, glaucoma, DVT/PE, diabetes, depression/anxiety, CKD 3B who presented to the hospital with lower extremity weakness.  There was concern for possible stroke and she was transferred from [place] to [facility] for MRI due to underlying pacemaker.  Unfortunately, after evaluation by radiology, her pacemaker was not MRI compatible.  She underwent further work-up regarding her weakness and was found to have low vitamin B12 which may be contributing.  She was placed on high-dose replacement during hospitalization.  Neurology was also consulted for further recommendations. She underwent CT head on admission which was negative for acute abnormalities.  Did show cerebral atrophy and microvascular disease.  She was continued on higher dose oral vitamin B12 at discharge.  Home health was also continued.  She was evaluated by neurology and also recommended to continue with further B12 repletion, diabetes control, physical therapy.  Stroke suspicion also still low and no further imaging studies warranted at this time.  She was discharged home in stable condition with ongoing outpatient home health.

## 2020-04-10 NOTE — Evaluation (Signed)
Speech Language Pathology Evaluation Patient Details Name: Tara Jackson MRN: [id number] DOB: 09-02-1940 Today's Date: 04/10/2020 Time: 6644-0347 SLP Time Calculation (min) (ACUTE ONLY): 45 min  Problem List:  Patient Active Problem List   Diagnosis Date Noted  . Lower extremity weakness 04/09/2020  . Dry mouth 08/12/2019  . Chronic respiratory failure with hypoxia (HCC) 07/26/2018  . Cough variant asthma vs uacs  07/25/2018  . Syncope due to orthostatic hypotension 09/12/2017  . Orthostatic dizziness 06/12/2017  . Diabetic neuropathy (HCC) 12/13/2016  . 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  . Acute renal failure superimposed on chronic kidney disease (HCC) 10/24/2016  . Neurocardiogenic syncope 10/23/2016  . Chronic anemia 10/11/2016  . Pacemaker 04/26/2015  . Chronic daily headache 04/07/2015  . Symptomatic bradycardia 03/24/2015  . Sinus node dysfunction (HCC) 03/24/2015  . Chronic diastolic heart failure (HCC)   . Diabetes mellitus type 2 in obese (HCC)   . Normal coronary arteries 01/14/2015  . Normal cardiac stress test 11/30/2014  . Bradycardia 11/28/2014  . 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  . Morbid obesity (HCC) 11/28/2014  . CKD stage 3 due to type 2 diabetes mellitus (HCC) 11/28/2014  . Memory loss 06/03/2014  . AKI (acute kidney injury) (HCC)   . Chest pain 04/19/2014  . Pleuritic chest pain 04/19/2014  . Diabetes mellitus (HCC) 04/19/2014  . Edema of both legs 04/19/2014  . Dyspnea   . DOE (dyspnea on exertion)   . Tremor of right hand 09/20/2013  . Headache(784.0) 09/19/2013  . Restless legs syndrome (RLS) 09/19/2013  . Constipation due to pain medication 08/21/2013  . Ankle edema 08/21/2013  . PVC's (premature ventricular contractions) 02/10/2013  . Diastolic dysfunction, left ventricle   . 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 PULMONARY EMBOLISM, HX OF 10/02/2009  . Rhinitis 04/07/2009  . Sleep apnea 07/09/2008  .  Hyperlipidemia with target LDL less than 130 05/20/2008  . Essential hypertension 05/20/2008   Past Medical History:  Past Medical History:  Diagnosis Date  . Anemia   . Anxiety   . Arthritis   . Asthma   . Brittle bone disease   . CKD (chronic kidney disease)   . Complication of anesthesia    hard to wake up after anesthesia, trouble turning head  . Coronary artery disease, non-occlusive 2014   a. minimal CAD by cath in 2014 with 40% RI stenosis. b. low-risk NST in 11/2014.  Marland Kitchen Depression   . Diabetes mellitus type 2, insulin dependent (HCC)   . Diabetic neuropathy (HCC)   . Diabetic retinopathy (HCC)    NPDR OU  . Diastolic dysfunction, left ventricle 11/30/14   EF 55%, grade 1 DD  . DVT (deep venous thrombosis) (HCC)    "18 in RLE; 7 LLE prior to PE" (03/24/2015)  . Family history of adverse reaction to anesthesia    " the whole family is hard to wake up"  . Glaucoma   . H/O hiatal hernia   . Hyperlipidemia   . Hypertension associated with diabetes (HCC)   . Hypertensive retinopathy    OU  . Hypothyroidism   . Kidney stones    "passed them"  . Macular degeneration    Exu OU  . Memory loss 06/03/2014  . On home oxygen therapy    "2L oxygen concentrator @ night"  . Osteoporosis   . Oxygen desaturation during sleep    wears 2 liters of oxygen at night   . Palpitations 35years ago  Cardionet monitor - revealed mostly normal sinus rhythm, sinus bradycardia with first-degree A-V block heart rates mostly in the 50s and 60s with some 70s. No arrhythmias, PVCs or PACs noted.  . PE (pulmonary thromboembolism) (HCC) x 3, last one was ~ 2003   history of recurrent RLE DVT with PE - last PE ~>13 yrs; Maintained on Plavix  . Pneumonia   . Presence of permanent cardiac pacemaker   . Restless legs syndrome   . Sleep apnea    cpap disontinued; test done 07/14/2008 ordered per  Byrum  . Spinal headache   . Spinal stenosis    L 3, L 4 and L 5, and C 1, C 2 and C3  . Stroke [facility])  04-19-2013   tia and stroke. Weakness Rt hand  . 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 s/p [employer] PPM in 03/2015 by Dr. Ladona Ridgel secondary to sinus node dysfunction.   Marland Kitchen TIA (transient ischemic attack) March 2014   Past Surgical History:  Past Surgical History:  Procedure Laterality Date  . ABDOMINAL HYSTERECTOMY    . APPENDECTOMY    . BACK SURGERY     steroid inj  . CATARACT EXTRACTION Bilateral   . CATARACT EXTRACTION W/ INTRAOCULAR LENS  IMPLANT, BILATERAL Bilateral 2000s  . CHOLECYSTECTOMY    . EP IMPLANTABLE DEVICE N/A 03/24/2015   [facility] [employer] PPM, Dr. Ladona Ridgel  . ESOPHAGOGASTRODUODENOSCOPY  08/09/2011   Procedure: ESOPHAGOGASTRODUODENOSCOPY (EGD);  Surgeon: Petra Kuba, MD;  Location: [facility] ENDOSCOPY;  Service: Endoscopy;  Laterality: N/A;  . ESOPHAGOGASTRODUODENOSCOPY (EGD) WITH PROPOFOL N/A 11/12/2013   Procedure: ESOPHAGOGASTRODUODENOSCOPY (EGD) WITH PROPOFOL;  Surgeon: Petra Kuba, MD;  Location: [facility] ENDOSCOPY;  Service: Endoscopy;  Laterality: N/A;  . ESOPHAGOGASTRODUODENOSCOPY (EGD) WITH PROPOFOL N/A 05/05/2016   Procedure: ESOPHAGOGASTRODUODENOSCOPY (EGD) WITH PROPOFOL;  Surgeon: Vida Rigger, MD;  Location: [facility] ENDOSCOPY;  Service: Endoscopy;  Laterality: N/A;  . EYE SURGERY Bilateral    Cat Sx OU  . GLAUCOMA SURGERY Bilateral 2000s   "laser"  . HOT HEMOSTASIS  08/09/2011   Procedure: HOT HEMOSTASIS (ARGON PLASMA COAGULATION/BICAP);  Surgeon: Petra Kuba, MD;  Location: [facility] ENDOSCOPY;  Service: Endoscopy;  Laterality: N/A;  . HOT HEMOSTASIS N/A 11/12/2013   Procedure: HOT HEMOSTASIS (ARGON PLASMA COAGULATION/BICAP);  Surgeon: Petra Kuba, MD;  Location: [facility] ENDOSCOPY;  Service: Endoscopy;  Laterality: N/A;  . INSERT / REPLACE / REMOVE PACEMAKER  03/24/2015  . IRIDOTOMY / IRIDECTOMY Bilateral   . JOINT REPLACEMENT    . KNEE SURGERY Left done with 2nd left knee replacement   spacer bar placement  . LEFT HEART CATHETERIZATION WITH CORONARY ANGIOGRAM N/A 02/14/2012    Procedure: LEFT HEART CATHETERIZATION WITH CORONARY ANGIOGRAM;  Surgeon: Marykay Lex, MD;  Location: [facility] CATH LAB;  Service: Cardiovascular:: no evidence of obstructive coronary disease ,low EDP with normal EF  . LEV DOPPLER  05/29/2012   RIGHT EXTREM. NORMAL VENOUS DUPLEX  . NM MYOCAR PERF WALL MOTION  08/2019   EF 55 to 65%.  No ischemia or infarction.  LOW RISK:  . PACEMAKER PLACEMENT    . REVISION TOTAL KNEE ARTHROPLASTY Left   . TONSILLECTOMY    . TOTAL KNEE ARTHROPLASTY Bilateral   . TRANSTHORACIC ECHOCARDIOGRAM  11/2017   11/2017: EF 60-65%. Gr1 DD. PAP ~33 mmHg.  Mild TR. PPM leads in RA & RV.   Marland Kitchen TRIGGER FINGER RELEASE  11/22/2011   Procedure: RELEASE TRIGGER FINGER/A-1 PULLEY;  Surgeon: Cammy Copa, MD;  Location: [facility] OR;  Service: Orthopedics;  Laterality: Left;  Left trigger thumb release  . TRIGGER FINGER RELEASE Right yrs ago   HPI:  80 yo female with h/o cognitive deficits, prior cva, admitted with LE weakness.  Pt CT brain negative and pt unable to have MRI due to her pacemaker.  Pt resides with her spouse and he manages all of the bills, cooking, etc.  She states she places her own medication in cup but he gives her the cup daily.   Assessment / Plan / Recommendation Clinical Impression  SLP administered SLUMS - with pt scoring 11/30 - scoring consistent with dementia.  Pt is on Aricept at baseline and scored 19/30 on prior MMSE x2 at neuro office.  Strengths included sustained attention and functional basic math.  Pt with most difficulty with lengther attention tasks including recall of information from paragraph, memory and clock drawing.  No dysarthria noted but pt's voice is low in phonatory strength.  Spouse arrived during session and SLP spoke to him and pt regarding recommendations for home support.  As pt enjoys writing, advised she obtain a planner to write happenings, feelings, appts, etc.  Pt reports anxiety re: her difficulties - and SlP encouraged her to  understand the change in her brain is not her fault and to focus on compensations to maximize autonomy.  She puts her own medications in cups at home and spouse gives medication to her.  Advised pt to have spouse to check and assure pt is takine medication properly due to high risk.  Spouse also reports he "can't hear" the pt well at home but voice is baseline.  Reviewed option for consideration for voice amplifier and encouraged to turn off all background noise in attempting communication.  Thanks for this consult.    SLP Assessment  SLP Recommendation/Assessment: Patient does not need any further Speech Lanaguage Pathology Services    Follow Up Recommendations  None    Frequency and Duration   n/a        SLP Evaluation Cognition  Overall Cognitive Status: Within Functional Limits for tasks assessed Arousal/Alertness: Awake/alert Orientation Level: Oriented to person;Oriented to place (disoriented to time - 7829) Attention: Sustained;Focused Focused Attention: Appears intact Sustained Attention: Appears intact Memory: Impaired Memory Impairment: Retrieval deficit;Storage deficit (0/5 words I, 4/5 with cues, did not id one word from choice of 5) Awareness: Impaired Problem Solving: Impaired (correcting clock) Safety/Judgment: Impaired Comments: SlP encouraged pt to have her spouse perform her medication placement in box - or at least double check to assure taking correctly given pt's progressive memory loss and high risk with medication management.  She advised that she feels fine with how she is managing currently.  Fortunately spouse arrived and SLP was able to relay concerns to him during session.       Comprehension  Auditory Comprehension Overall Auditory Comprehension: Appears within functional limits for tasks assessed Yes/No Questions: Not tested Commands: Within Functional Limits (for basic tasks) Visual Recognition/Discrimination Discrimination: Not tested Reading  Comprehension Reading Status: Not tested    Expression Expression Primary Mode of Expression: Verbal Verbal Expression Overall Verbal Expression: Appears within functional limits for tasks assessed Initiation: No impairment Written Expression Dominant Hand: Right Written Expression: Exceptions to [facility] (pt with difficulty drawing clock - did not test writing, she enjoys writing per her statement)   Oral / Motor  Motor Speech Overall Motor Speech: Other (comment) (right facial asymmetry - baseline per pt) Respiration: Within functional limits Resonance: Within functional limits Articulation: Within functional  limitis Intelligibility: Intelligible Motor Planning: Witnin functional limits   GO                    Chales Abrahams 04/10/2020, 11:07 AM  Rolena Infante, MS [facility] SLP Acute Rehab Services Office [phone] Pager [phone]

## 2020-04-10 NOTE — Assessment & Plan Note (Addendum)
-  Continue NPH and SSI -Continue CBG monitoring -A1c 8.4%

## 2020-04-10 NOTE — Evaluation (Addendum)
Physical Therapy Evaluation Patient Details Name: Tara Jackson MRN: [id number] DOB: 06-Jul-1940 Today's Date: 04/10/2020   History of Present Illness  Pt is an 80 y.o. female admitted 04/09/20 with BLE weakness, headaches and multiple falls. Head CT negative for acute abnormality. Cervical CT negative for acute change; noted multilevel degenerative changes, prominent C3-4, C5-6. Unable to do MRI due to pacemaker. PMH includes TIA, CVA x2, pacemaker, HTN, CKD 3, DM2, neuropathy, retinopathy.    Clinical Impression  Pt presents with an overall decrease in functional mobility secondary to above. PTA, pt requires assist from husband for mobility and ADL tasks, pt walks short distances with assist, but typically uses electric scooter for household and community distances. Today, pt required min-modA for mobility, limited by generalized weakness and fatigue. Pt's BLE strength >3/5 throughout, no focal weakness noted. Pt would benefit from continued acute PT services to maximize functional mobility and independence prior to d/c with HHPT services as long as husband able to continue providing necessary assist at home.     Follow Up Recommendations Home health PT;Supervision for mobility/OOB    Equipment Recommendations  None recommended by PT    Recommendations for Other Services       Precautions / Restrictions Precautions Precautions: Fall;Other (comment) Precaution Comments: Patient reports feeling unsteady on her feet with average of 4 falls per month; typically wears Depends/diapers at home Restrictions Weight Bearing Restrictions: No      Mobility  Bed Mobility Overal bed mobility: Needs Assistance Bed Mobility: Supine to Sit     Supine to sit: Min assist;HOB elevated     General bed mobility comments: HHA +1 and Min A to elevated trunk with heavy use of rail. Cues for hand placement. Patient requires increased time.    Transfers Overall transfer level: Needs  assistance Equipment used: 1 person hand held assist;2 person hand held assist Transfers: Sit to/from [facility] Sit to Stand: Mod assist Stand pivot transfers: Min guard;Min assist;+2 physical assistance       General transfer comment: Sit to stand from EOB with modA for HHA to assist trunk elevation; pt with initial wide BOS and reports fearful of falling. Pivotal steps to recliner with minA+2 for bilateral HHA, min guard physical assist to maintain balance; minA for eccentric control into sitting; cues for hand placement  Ambulation/Gait                Stairs            Wheelchair Mobility    Modified Rankin (Stroke Patients Only)       Balance Overall balance assessment: Needs assistance Sitting-balance support: Bilateral upper extremity supported;Single extremity supported;Feet supported Sitting balance-Leahy Scale: Fair Sitting balance - Comments: Patient able to maintain static sitting balance at EOB with at least unilateral UE support on bed surface. Mild slow posterior LOB with LE MMT but patient able to recover without external assist.   Standing balance support: Bilateral upper extremity supported;During functional activity Standing balance-Leahy Scale: Poor Standing balance comment: Reliant on UE support                             Pertinent Vitals/Pain Pain Assessment: No/denies pain    Home Living Family/patient expects to be discharged to:: Private residence Living Arrangements: Spouse/significant other Available Help at Discharge: Family Type of Home: House Home Access: Stairs to enter Entrance Stairs-Rails:  (post pt holds onto) Technical brewer of Steps: 1 Home Layout:  One level Home Equipment: [place] - 2 wheels;Wheelchair - Brewing technologist;Wheelchair - power      Prior Function Level of Independence: Needs assistance   Gait / Transfers Assistance Needed: Uses hoverround (electric scooter) around  home, especially since she has fallen (~4x/month); supervision for mobility - sounds like husband brings electric w/c behind pt when she walks to bathroom  ADL's / Homemaking Assistance Needed: Sits down on commode for sponge bathing at sink, use of sink to stand up from raised toilet seat; husband assists with bathing, dressing; wears diapers/briefs at home; husband assists with medication management        Hand Dominance   Dominant Hand: Right    Extremity/Trunk Assessment   Upper Extremity Assessment Upper Extremity Assessment: Generalized weakness    Lower Extremity Assessment Lower Extremity Assessment: Generalized weakness    Cervical / Trunk Assessment Cervical / Trunk Assessment: Normal  Communication   Communication: No difficulties  Cognition Arousal/Alertness: Awake/alert Behavior During Therapy: WFL for tasks assessed/performed Overall Cognitive Status: No family/caregiver present to determine baseline cognitive functioning                                 General Comments: Pt answering questions appropriately, following commands with increased time; reports husband assists with med management. Not formally assessed      General Comments General comments (skin integrity, edema, etc.): VSS on RA. Attempted to call pt's husband at end of session but no answer, left voicemail to discuss pt's current functional mobility and d/c planning    Exercises     Assessment/Plan    PT Assessment Patient needs continued PT services  PT Problem List Decreased strength;Decreased activity tolerance;Decreased balance;Decreased mobility       PT Treatment Interventions DME instruction;Gait training;Functional mobility training;Stair training;Therapeutic activities;Therapeutic exercise;Balance training;Patient/family education;Wheelchair mobility training    PT Goals (Current goals can be found in the Care Plan section)  Acute Rehab PT Goals Patient Stated Goal:  To return home with husband's help PT Goal Formulation: With patient Time For Goal Achievement: 04/24/20 Potential to Achieve Goals: Good    Frequency Min 3X/week   Barriers to discharge        Co-evaluation PT/OT/SLP Co-Evaluation/Treatment: Yes Reason for Co-Treatment: Complexity of the patient's impairments (multi-system involvement);For patient/therapist safety;To address functional/ADL transfers PT goals addressed during session: Mobility/safety with mobility;Balance OT goals addressed during session: ADL's and self-care       AM-PAC PT "6 Clicks" Mobility  Outcome Measure Help needed turning from your back to your side while in a flat bed without using bedrails?: A Little Help needed moving from lying on your back to sitting on the side of a flat bed without using bedrails?: A Little Help needed moving to and from a bed to a chair (including a wheelchair)?: A Little Help needed standing up from a chair using your arms (e.g., wheelchair or bedside chair)?: A Lot Help needed to walk in hospital room?: A Little Help needed climbing 3-5 steps with a railing? : A Lot 6 Click Score: 16    End of Session Equipment Utilized During Treatment: Gait belt Activity Tolerance: Patient tolerated treatment well;Patient limited by fatigue Patient left: in chair;with call bell/phone within reach;with chair alarm set Nurse Communication: Mobility status PT Visit Diagnosis: Other abnormalities of gait and mobility (R26.89);Muscle weakness (generalized) (M62.81)    Time: 6010-9323 PT Time Calculation (min) (ACUTE ONLY): 24 min   Charges:  PT Evaluation $PT Eval Moderate Complexity: Brooks, PT, DPT Acute Rehabilitation Services  Pager [phone] Office [place] 04/10/2020, 11:24 AM

## 2020-04-10 NOTE — Consult Note (Signed)
Neurology Consultation  Reason for Consult: BLE weakness Referring Physician: Ardeen Jourdain, MD  CC: weak legs x one month  History is obtained from: patient  HPI: Tara Jackson is a 80 y.o. female with PMHx of IDDM II with neuropathy and retinopathy, CKD, CAD, CHF grade I diastolic dysfunction, glaucoma, HTN, macular degeneration, morbid obesity, sick sinus syndrome s/p PPM, OSA with night time O2,  hypothyroidism, PE/DVT, stroke 15 yrs ago, and spinal stenosis. Tara Jackson presented to [facility] ED on 04/09/20 with c/o lower extremity weakness and intermittent HAs. States she had multiple falls at home. Per H&P, she had weakness x one week, but on NP exam, she says x 1 month.   Nortriptyline was prescribed for neuropathy symptoms years ago. In review of neurology out patient chart notes, it is documented that she has had HAs since 2014. Also, prescribed Aricept for memory loss. Falls are documented by outpatient neurology visits as far back as 2018. 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 Use of walker for LE weakness and assistance from her husband is documented as far back as 2019. In 2020, it was documented that she had a gait abnormality with use of a scooter.   She says about a month ago, she started having LE weakness and some falls. States weakness was not associated with any specific event. States she needs a lot more help with ambulation, ADLs, getting OOB, or pivoting from bed to chair. States she is requiring husband standby to get on and off the toilet. She has a higher commode with handles and states she can ambulate to the bathroom with husband's assist and can use handles on toilet seat to sit and stand. She does not to use a walker because she is afraid she will fail. She uses an Transport planner most of the time.   She denies numbness or tingling, ascending weakness, dysphagia, dysarthria, aphasia, facial droop during this time and had no sequelae from prior stroke. She does  have a history of diabetic neuropathy. No urinary incontinence. + history of lumbar and cervical spinal stenosis. She has not been able to have spinal MRIs due to PPM  ROS: A 14 point ROS was performed and is negative except as noted in the HPI.   Past Medical History:  Diagnosis Date  . Anemia   . Anxiety   . Arthritis   . Asthma   . Brittle bone disease   . CKD (chronic kidney disease)   . Complication of anesthesia    hard to wake up after anesthesia, trouble turning head  . Coronary artery disease, non-occlusive 2014   a. minimal CAD by cath in 2014 with 40% RI stenosis. b. low-risk NST in 11/2014.  Marland Kitchen Depression   . Diabetes mellitus type 2, insulin dependent ([place])   . Diabetic neuropathy ([place])   . Diabetic retinopathy ([place])    NPDR OU  . Diastolic dysfunction, left ventricle 11/30/14   EF 55%, grade 1 DD  . DVT (deep venous thrombosis) ([place])    "18 in RLE; 7 LLE prior to PE" (03/24/2015)  . Family history of adverse reaction to anesthesia    " the whole family is hard to wake up"  . Glaucoma   . H/O hiatal hernia   . Hyperlipidemia   . Hypertension associated with diabetes ([place])   . Hypertensive retinopathy    OU  . Hypothyroidism   . Kidney stones    "passed them"  . Macular degeneration  Exu OU  . Memory loss 06/03/2014  . On home oxygen therapy    "2L oxygen concentrator @ night"  . Osteoporosis   . Oxygen desaturation during sleep    wears 2 liters of oxygen at night   . Palpitations 35years ago   Cardionet monitor - revealed mostly normal sinus rhythm, sinus bradycardia with first-degree A-V block heart rates mostly in the 50s and 60s with some 70s. No arrhythmias, PVCs or PACs noted.  . PE (pulmonary thromboembolism) ([place]) x 3, last one was ~ 2003   history of recurrent RLE DVT with PE - last PE ~>13 yrs; Maintained on Plavix  . Pneumonia   . Presence of permanent cardiac pacemaker   . Restless legs syndrome   . Sleep apnea    cpap disontinued; test done  07/14/2008 ordered per  Byrum  . Spinal headache   . Spinal stenosis    L 3, L 4 and L 5, and C 1, C 2 and C3  . Stroke [facility]) 04-19-2013   tia and stroke. Weakness Rt hand  . 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 s/p [employer] PPM in 03/2015 by Dr. Lovena Le secondary to sinus node dysfunction.   Marland Kitchen TIA (transient ischemic attack) March 2014    Family History  Problem Relation Age of Onset  . Cancer Mother   . Diabetes Mother   . Cancer - Prostate Father   . Diabetes Father   . Retinal detachment Son   . Diabetes Sister   . Diabetes Brother   . Diabetes Paternal Grandmother     Social History:   reports that she is a non-smoker but has been exposed to tobacco smoke. She has never used smokeless tobacco. She reports that she does not drink alcohol and does not use drugs.  Medications  Current Facility-Administered Medications:  .  acetaminophen (TYLENOL) tablet 650 mg, 650 mg, Oral, Q4H PRN **OR** acetaminophen (TYLENOL) 160 MG/5ML solution 650 mg, 650 mg, Per Tube, Q4H PRN **OR** acetaminophen (TYLENOL) suppository 650 mg, 650 mg, Rectal, Q4H PRN, Etta Quill, DO .  acidophilus (RISAQUAD) capsule 1 capsule, 1 capsule, Oral, Daily, Alcario Drought, Jared M, DO, 1 capsule at 04/10/20 0930 .  albuterol (VENTOLIN HFA) 108 (90 Base) MCG/ACT inhaler 2 puff, 2 puff, Inhalation, Q6H PRN, Etta Quill, DO .  busPIRone (BUSPAR) tablet 10 mg, 10 mg, Oral, q morning, Jennette Kettle M, DO, 10 mg at 04/10/20 0943 .  clonazePAM (KLONOPIN) tablet 1 mg, 1 mg, Oral, Daily, 1 mg at 04/10/20 0930 **AND** clonazePAM (KLONOPIN) tablet 0.5 mg, 0.5 mg, Oral, QHS, Alcario Drought, Jared M, DO, 0.5 mg at 04/09/20 2306 .  clopidogrel (PLAVIX) tablet 75 mg, 75 mg, Oral, q morning, Etta Quill, DO, 75 mg at 04/10/20 0929 .  darifenacin (ENABLEX) 24 hr tablet 7.5 mg, 7.5 mg, Oral, Daily, Jennette Kettle M, DO, 7.5 mg at 04/10/20 0931 .  dicyclomine (BENTYL) tablet 20 mg, 20 mg, Oral, QHS, Gardner, Jared M,  DO .  dicyclomine (BENTYL) tablet 40 mg, 40 mg, Oral, Daily, Jennette Kettle M, DO, 20 mg at 04/10/20 0931 .  donepezil (ARICEPT) tablet 10 mg, 10 mg, Oral, QHS, Gardner, Jared M, DO, 10 mg at 04/10/20 0054 .  enoxaparin (LOVENOX) injection 60 mg, 60 mg, Subcutaneous, Q24H, Gardner, Jared M, DO, 60 mg at 04/09/20 2307 .  guaiFENesin (MUCINEX) 12 hr tablet 600 mg, 600 mg, Oral, BID PRN, Etta Quill, DO .  insulin aspart (novoLOG) injection 0-15 Units, 0-15  Units, Subcutaneous, TID WC, Etta Quill, DO, 5 Units at 04/10/20 [phone] .  insulin aspart (novoLOG) injection 0-5 Units, 0-5 Units, Subcutaneous, QHS, Gardner, Jared M, DO .  insulin NPH Human (NOVOLIN N) injection 20 Units, 20 Units, Subcutaneous, QAC breakfast, Etta Quill, DO, 20 Units at 04/10/20 0932 .  levothyroxine (SYNTHROID) tablet 100 mcg, 100 mcg, Oral, Q0600, Etta Quill, DO, 100 mcg at 04/10/20 0518 .  linaclotide (LINZESS) capsule 290 mcg, 290 mcg, Oral, QAC breakfast, Etta Quill, DO, 290 mcg at 04/10/20 0930 .  mometasone-formoterol (DULERA) 100-5 MCG/ACT inhaler 2 puff, 2 puff, Inhalation, BID, Etta Quill, DO, 2 puff at 04/10/20 0901 .  nortriptyline (PAMELOR) capsule 50 mg, 50 mg, Oral, BID, Etta Quill, DO, 50 mg at 04/10/20 0943 .  pantoprazole (PROTONIX) EC tablet 40 mg, 40 mg, Oral, Daily, Etta Quill, DO, 40 mg at 04/10/20 0943 .  polyethylene glycol (MIRALAX / GLYCOLAX) packet 17 g, 17 g, Oral, Daily PRN, Alcario Drought, Jared M, DO .  rosuvastatin (CRESTOR) tablet 20 mg, 20 mg, Oral, Daily, Alcario Drought, Jared M, DO, 20 mg at 04/10/20 0930 .  vitamin B-12 (CYANOCOBALAMIN) tablet 100 mcg, 100 mcg, Oral, Daily, Etta Quill, DO   Exam: Current vital signs: BP (!) 123/51 (BP Location: Right Arm)   Pulse 60   Temp (!) 97.2 F (36.2 C) (Oral)   Resp 13   Ht 5' 7.5" (1.715 m)   Wt 115.6 kg   SpO2 95%   BMI 39.33 kg/m  Vital signs in last 24 hours: Temp:  [97.2 F (36.2 C)-98.2 F  (36.8 C)] 97.2 F (36.2 C) (03/04 0736) Pulse Rate:  [59-65] 60 (03/04 0901) Resp:  [13-23] 13 (03/04 0901) BP: (123-157)/(51-75) 123/51 (03/04 0736) SpO2:  [94 %-100 %] 95 % (03/04 0901) Weight:  [115.6 kg-115.7 kg] 115.6 kg (03/03 2359)  GENERAL: Awake, alert in NAD. Obese.  HEENT: - Normocephalic and atraumatic. No ptosis on upward gaze, dry mm, no LN++, no thyromegaly LUNGS - Normal respiratory effort.  CV - RRR on tele. 1+ edema to feet.  ABDOMEN - Soft, nontender Ext: warm, well perfused. Well healed scars to bilateral knees.  Psych: Affect is appropriate to situation.   NEURO:  Mental Status: AA&Ox3  Speech/Language: speech is without dysarthria or aphasia. Naming, repetition, fluency, and comprehension intact. Cranial Nerves:  II: PERRL 29mm/brisk. visual fields full.  III, IV, VI: EOMI. Lid elevation symmetric and full.  V: sensation is intact and symmetrical to face. Blinks to threat. Moves jaw back and forth.  VII: Smile is symmetrical. Able to puff cheeks and raise eyebrows.  VIII:hearing intact to voice IX, X: palate elevation is symmetric. Phonation normal.  XI: normal sternocleidomastoid and trapezius muscle strength FUX:NATFTD is symmetrical without fasciculations.   Motor: 4+/5 strength is present to BLEs and 5/5 BUEs.  Tone is normal. Bulk is normal. Obese.  Sensation-she has decreased to light touch below the knees bilaterally extinction absent to light touch to DSS. Her light touch sensation is decreased to the right V2 distribution. Vibration is absent to left great toe and decreased to bilateral LEs from feet to knees. Cold is decreased to BLE from feet to knees.  Coordination: FTN intact bilaterally. No pronator drift.  DTRs: 2+RUE, 1+LUE. 0 BLE. .  Gait- deferred  Labs I have reviewed labs in epic and the results pertinent to this consultation are: Glucose 194, LDL 38, TSH 1.238, HbA1c 8.4, B12 285  CBC  Component Value Date/Time   WBC 6.4  04/09/2020 1715   RBC 3.91 04/09/2020 1715   HGB 12.2 04/09/2020 1715   HCT 39.1 04/09/2020 1715   PLT 210 04/09/2020 1715   MCV 100.0 04/09/2020 1715   MCH 31.2 04/09/2020 1715   MCHC 31.2 04/09/2020 1715   RDW 12.3 04/09/2020 1715   LYMPHSABS 1.3 04/09/2020 1715   MONOABS 0.4 04/09/2020 1715   EOSABS 0.1 04/09/2020 1715   BASOSABS 0.1 04/09/2020 1715    CMP     Component Value Date/Time   NA 138 04/09/2020 1715   K 4.0 04/09/2020 1715   CL 102 04/09/2020 1715   CO2 25 04/09/2020 1715   GLUCOSE 227 (H) 04/09/2020 1715   BUN 18 04/09/2020 1715   CREATININE 1.64 (H) 04/09/2020 1715   CREATININE 1.64 (H) 04/09/2015 1438   CALCIUM 9.4 04/09/2020 1715   PROT 7.1 04/09/2020 1715   PROT 7.0 03/07/2017 1531   ALBUMIN 3.7 04/09/2020 1715   AST 20 04/09/2020 1715   ALT 13 04/09/2020 1715   ALKPHOS 63 04/09/2020 1715   BILITOT 0.6 04/09/2020 1715   GFRNONAA 31 (L) 04/09/2020 1715   GFRAA 32 (L) 07/19/2019 0929    Lipid Panel     Component Value Date/Time   CHOL 119 04/10/2020 0214   TRIG 71 04/10/2020 0214   HDL 67 04/10/2020 0214   CHOLHDL 1.8 04/10/2020 0214   VLDL 14 04/10/2020 0214   LDLCALC 38 04/10/2020 0214     Imaging CT CSpine 1. No acute osseous abnormality. 2. Moderate severity multilevel degenerative changes, most prominent at the levels of C3-C4 and C5-C6. 3. Marked severity right maxillary sinus disease.  CTH 1. No acute intracranial abnormality. 2. 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 3. Marked severity right maxillary sinus disease.  Assessment: 80 yo obese female who states she has had increased LE weakness for one month. States she needs more help performing daily tasks than before. She was diagnosed with axonal polyneuropathy by EMG in 2019. She has well documented history on neurology out patient notes of gait disturbance, falls, and weakness as far back as 2018. She also has documented Diabetic  neuropathy.  Today, she is non focal on exam except for weakness which is likely chronic, given history.   Impression: 1. History of axonal polyneuropathy and diabetic neuropathy.  2. Obesity with severe deconditioning.  3. History of PPM and can not have MRIs.  4. CT cspine does show severe multilevel degenerative changes which patient says she was told she is not a surgical candidate 5. Well documented history of HAs, gait disturbance, sedentary lifestyle, diabetic and axonal polyneuropathy.   Recommendations: -Tighter DM control - Increase B12 repletion-ordered. Would like to see level over 500 -PT/OT -will check methylmalonic acid-ordered  Pt seen by Clance Boll, NP/Neuro and later by MD. Note/plan to be edited by MD as needed.  Pager: [phone]  I have seen the patient and made appropriate changes to the above note.  80 year old female with slowly progressive gait dysfunction who has finally lost the ability to compensate.  I do not think there is any acute component to this progression, it is simply that she has reached the point where she is no longer able to do her regular activities which prompted her to seek care.  Certainly her Z66 is borderline, and this could be contributing and therefore I agree with repletion.  Other than this, I think that diabetes control and physical therapy  would be the mainstay of treatments.  No other acute diagnostics or recommendations, please call if further questions or concerns.  Roland Rack, MD [facility] [phone]  If 7pm- 7am, please page neurology on call as listed in [place].

## 2020-04-10 NOTE — Assessment & Plan Note (Signed)
-   flat affect noted - continue buspar, klonopin, nortriptyline

## 2020-04-10 NOTE — Assessment & Plan Note (Signed)
-   due to pacer, not MRI compatible, patient unable to undergo MRI

## 2020-04-10 NOTE — Assessment & Plan Note (Signed)
-  Continue Crestor -LDL 38

## 2020-04-10 NOTE — Progress Notes (Addendum)
Inpatient Diabetes Program Recommendations  AACE/ADA: New Consensus Statement on Inpatient Glycemic Control (2015)  Target Ranges:  Prepandial:   less than 140 mg/dL      Peak postprandial:   less than 180 mg/dL (1-2 hours)      Critically ill patients:  140 - 180 mg/dL   Lab Results  Component Value Date   GLUCAP 276 (H) 04/10/2020   HGBA1C 8.4 (H) 04/10/2020    Review of Glycemic Control Results for [name], [name] (MRN [id number]) as of 04/10/2020 12:08  Ref. Range 04/09/2020 19:20 04/09/2020 22:39 04/10/2020 02:57 04/10/2020 07:27 04/10/2020 11:45  Glucose-Capillary Latest Ref Range: 70 - 99 mg/dL 128 (H) 169 (H) 204 (H) 239 (H) 276 (H)   Diabetes history: DM 2 Outpatient Diabetes medications:  NPH 20 units q AM and NPH 5 units q PM Regular insulin- 25 units q AM and and 20 units at 4 pm Glucotrol 10 mg bid Current orders for Inpatient glycemic control:  Novolog moderate tid with meals and HS NPH 20 units q AM Inpatient Diabetes Program Recommendations:   May consider adding NPH 10 units q HS.    Thanks  Adah Perl, RN, BC-ADM Inpatient Diabetes Coordinator Pager [phone] (8a-5p)

## 2020-04-10 NOTE — Progress Notes (Signed)
Sent chat message to Dr. Sabino Gasser:  MRI just called and they will NOT be able to complete the MRI as the patient's pacer is not compatible.

## 2020-04-10 NOTE — Assessment & Plan Note (Addendum)
-  Weakness is bilateral, slightly worse than the left which she says is typical for her.  Unable to undergo MRI due to incompatibility of pacer - checking B12, 285 (actually considered low and could be playing a significant role in her weakness, especially as stroke suspicion is not extremely high at this time); patient reports her weakness has been progressive over several weeks and is typically dependent on a hoverround at baseline but lately has been need husband to help assist her into her WC - check MMA level: pending at discharge  - TSH normal, 1.238 - start on daily SQ B12 and will need ongoing replacement at d/c (only on 100 mcg daily at home, likely not enough) - neurology also consulted: agree with B12 repletion, HHPT, risk factor modification as able; no further imaging studies recommended

## 2020-04-10 NOTE — Evaluation (Signed)
Occupational Therapy Evaluation Patient Details Name: Tara Jackson MRN: [id number] DOB: [birth year] Today's Date: 04/10/2020    History of Present Illness 80 y.o. female presenting with BLE weakness x1 week reporting multiple falls and intermittent headaches x4 days. CT cervical spine (-) for acute osseous abnormality. Noted moderate severity multilevel degenerative changes, most prominent at the levels of C3-C4 and C5-C6. CT head (-) acute intracranial abnormality. Further work-up pending. PMHx significant for previous TIA, CVA x2, cardiac pacemaker, HTN, CKD III, DMII w/ neuropathy and retinopathy, and PPM.   Clinical Impression   PTA patient was living with her husband in a private residence and was requiring assist with ADLs/IADLs and functional transfers from her husband. Patient reports hired help for housekeeping. Husband takes care of finances. Patient reports managing meds independently and use of Hoveround vs. RW for functional mobility in the home. Patient currently limited by deficits listed below including high fall risk with patient reporting an average of 4 falls monthly. Patient currently requires Mod A grossly for ADLs and ADL transfers with Min A +2 and HHA. Patient would benefit from continued acute OT services in prep for safe d/c to next level of care with recommendation for HHOT pending families ability to provide necessary level of supervision/assist.     Follow Up Recommendations  Home health OT;Supervision/Assistance - 24 hour    Equipment Recommendations  None recommended by OT (Patient has necessary DME.)    Recommendations for Other Services       Precautions / Restrictions Precautions Precautions: Fall Precaution Comments: Patient reports feeling unsteady on her feet with average of 4 falls per month. Restrictions Weight Bearing Restrictions: No      Mobility Bed Mobility Overal bed mobility: Needs Assistance Bed Mobility: Supine to Sit     Supine to  sit: Min assist;HOB elevated     General bed mobility comments: HHA +1 and Min A to elevated trunk with heavy use of rail. Cues for hand placement. Patient requires more than increased time.    Transfers Overall transfer level: Needs assistance Equipment used: 2 person hand held assist Transfers: Sit to/from [employer] Sit to Stand: Mod assist Stand pivot transfers: Min guard;Min assist;+2 physical assistance       General transfer comment: Sit to stand from EOB with heavy Mod A and R knee block. Patient initially with wide BOS. Patient with fear of falling. Min A +2 with bilateral HHA for stand pivot to recliner. Min A for controlled descent and cues for hand placement.    Balance Overall balance assessment: Needs assistance Sitting-balance support: Bilateral upper extremity supported;Single extremity supported;Feet supported Sitting balance-Leahy Scale: Fair Sitting balance - Comments: Patient able to maintain static sitting balance at EOB with at least unilateral UE support on bed surface. Mild slow posterior LOB with LE MMT but patient able to recover without external assist.   Standing balance support: Bilateral upper extremity supported;During functional activity Standing balance-Leahy Scale: Poor Standing balance comment: Reliant on +2 external assist to maintain static standing balance.                           ADL either performed or assessed with clinical judgement   ADL Overall ADL's : Needs assistance/impaired Eating/Feeding: Set up;Sitting               Upper Body Dressing : Set up;Sitting Upper Body Dressing Details (indicate cue type and reason): Donned posterior hospital gown seated EOB. Lower  Body Dressing: Minimal assistance;Sit to/from stand Lower Body Dressing Details (indicate cue type and reason): Min A to don slip on shoes seated EOB. Patient states that she does not wear socks. Toilet Transfer: Minimal assistance;+2 for  physical assistance Toilet Transfer Details (indicate cue type and reason): Simulated with transfer to recliner with HHA +2 and no AD.         Functional mobility during ADLs: Minimal assistance;+2 for physical assistance General ADL Comments: Patient limited by generalized weakness greatest in BLE and fear of falling. Patient advanced to recliner with HHA +2 for steadying/safety.     Vision Baseline Vision/History: Wears glasses Wears Glasses: At all times Patient Visual Report: No change from baseline       Perception     Praxis      Pertinent Vitals/Pain Pain Assessment: No/denies pain     Hand Dominance Right   Extremity/Trunk Assessment     Lower Extremity Assessment Lower Extremity Assessment: Defer to PT evaluation   Cervical / Trunk Assessment Cervical / Trunk Assessment: Normal   Communication Communication Communication: No difficulties   Cognition Arousal/Alertness: Awake/alert Behavior During Therapy: WFL for tasks assessed/performed Overall Cognitive Status: Within Functional Limits for tasks assessed                                     General Comments  VSS on RA.    Exercises     Shoulder Instructions      Home Living Family/patient expects to be discharged to:: Private residence Living Arrangements: Spouse/significant other Available Help at Discharge: Family;Available PRN/intermittently Type of Home: House Home Access: Stairs to enter [employer] of Steps: 1 Entrance Stairs-Rails:  (post to hold onto) Home Layout: One level     Bathroom Shower/Tub: Producer, television/film/video: Handicapped height     Home Equipment: Environmental consultant - 2 wheels;Wheelchair - manual;Shower seat          Prior Functioning/Environment Level of Independence: Needs assistance  Gait / Transfers Assistance Needed: Uses hoverround (electric scooter) around home, especially since she has fallen (~4x/month); supervision for  mobility ADL's / Homemaking Assistance Needed: Sits down on commode for sponge bathing at sink, use of sink to stand up from raised toilet seat; husband assists with bathing, dressing. Wears diapers/briefs at home.            OT Problem List: Decreased strength;Decreased activity tolerance;Impaired balance (sitting and/or standing)      OT Treatment/Interventions: Self-care/ADL training;Therapeutic exercise;Energy conservation;DME and/or AE instruction;Therapeutic activities;Patient/family education;Balance training    OT Goals(Current goals can be found in the care plan section) Acute Rehab OT Goals Patient Stated Goal: To return home. OT Goal Formulation: With patient Time For Goal Achievement: 04/24/20 Potential to Achieve Goals: Good ADL Goals Pt Will Perform Grooming: with supervision;standing Pt Will Perform Upper Body Dressing: sitting;with set-up Pt Will Perform Lower Body Dressing: with supervision;sit to/from stand;with adaptive equipment Pt Will Transfer to Toilet: with supervision;ambulating Pt Will Perform Toileting - Clothing Manipulation and hygiene: with supervision;sit to/from stand Pt/caregiver will Perform Home Exercise Program: Both right and left upper extremity;With written HEP provided;Increased strength;Increased ROM  OT Frequency: Min 2X/week   Barriers to D/C:            Co-evaluation PT/OT/SLP Co-Evaluation/Treatment: Yes Reason for Co-Treatment: Complexity of the patient's impairments (multi-system involvement)   OT goals addressed during session: ADL's and self-care  AM-PAC OT "6 Clicks" Daily Activity     Outcome Measure Help from another person eating meals?: A Little Help from another person taking care of personal grooming?: A Little Help from another person toileting, which includes using toliet, bedpan, or urinal?: A Little Help from another person bathing (including washing, rinsing, drying)?: A Lot Help from another person to put  on and taking off regular upper body clothing?: A Little Help from another person to put on and taking off regular lower body clothing?: A Lot 6 Click Score: 16   End of Session Equipment Utilized During Treatment: Gait belt Nurse Communication: Mobility status  Activity Tolerance: Patient tolerated treatment well Patient left: in chair;with call bell/phone within reach;with chair alarm set  OT Visit Diagnosis: Unsteadiness on feet (R26.81);Other abnormalities of gait and mobility (R26.89);Repeated falls (R29.6);Muscle weakness (generalized) (M62.81)                Time: 9147-8295 OT Time Calculation (min): 20 min Charges:  OT General Charges $OT Visit: 1 Visit OT Evaluation $OT Eval Moderate Complexity: 1 Mod  Abrielle Finck H. OTR/L Supplemental OT, Department of rehab services [phone]   Sinan Tuch R H. 04/10/2020, 9:36 AM

## 2020-04-10 NOTE — ED Provider Notes (Signed)
Care assumed from Metcalfe at shift change, please see her note for full details, but in brief Tara Jackson is a 80 y.o. female who presents with progressively worsening weakness of bilateral lower extremities over the past week, associated with some pain in her left leg initially now progressing to the right.  No longer able to stand on her own and has had multiple falls including one today.  Care signed out pending the rest of patient's lab evaluation and imaging.  Anticipate admission  Labs Reviewed  COMPREHENSIVE METABOLIC PANEL - Abnormal; Notable for the following components:      Result Value   Glucose, Bld 227 (*)    Creatinine, Ser 1.64 (*)    GFR, Estimated 31 (*)    All other components within normal limits  CBG MONITORING, ED - Abnormal; Notable for the following components:   Glucose-Capillary 128 (*)    All other components within normal limits  CBG MONITORING, ED - Abnormal; Notable for the following components:   Glucose-Capillary 169 (*)    All other components within normal limits  URINE CULTURE  SARS CORONAVIRUS 2 (TAT 6-24 HRS)  URINALYSIS, ROUTINE W REFLEX MICROSCOPIC  CBC WITH DIFFERENTIAL/PLATELET  LACTIC ACID, PLASMA  MAGNESIUM  LACTIC ACID, PLASMA  HEMOGLOBIN A1C  LIPID PANEL  TROPONIN I (HIGH SENSITIVITY)  TROPONIN I (HIGH SENSITIVITY)   CT Head Wo Contrast  Result Date: 04/09/2020 CLINICAL DATA:  Headache and frequent falls. EXAM: CT HEAD WITHOUT CONTRAST TECHNIQUE: Contiguous axial images were obtained from the base of the skull through the vertex without intravenous contrast. COMPARISON:  March 19, 2019 FINDINGS: Brain: There is mild cerebral atrophy with widening of the extra-axial spaces and ventricular dilatation. There are areas of decreased attenuation within the white matter tracts of the supratentorial brain, consistent with microvascular disease changes. Vascular: No hyperdense vessel or unexpected calcification. Skull: Negative for  an acute fracture or focal lesion. Sinuses/Orbits: There is marked severity right maxillary sinus mucosal thickening. Other: None. IMPRESSION: 1. No acute intracranial abnormality. 2. 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 3. Marked severity right maxillary sinus disease. Electronically Signed   By: Virgina Norfolk M.D.   On: 04/09/2020 18:24   CT Cervical Spine Wo Contrast  Result Date: 04/09/2020 CLINICAL DATA:  Bilateral leg pain and weakness with frequent falls. EXAM: CT CERVICAL SPINE WITHOUT CONTRAST TECHNIQUE: Multidetector CT imaging of the cervical spine was performed without intravenous contrast. Multiplanar CT image reconstructions were also generated. COMPARISON:  March 19, 2019 FINDINGS: Alignment: Normal. Skull base and vertebrae: No acute fracture. No primary bone lesion or focal pathologic process. Soft tissues and spinal canal: No prevertebral fluid or swelling. No visible canal hematoma. Disc levels: Moderate severity endplate sclerosis is seen at the levels of C3-C4 and C5-C6. Mild endplate sclerosis is seen at the level of C6-C7. There is marked severity narrowing of the anterior atlantoaxial articulation. Moderate severity intervertebral disc space narrowing is seen at the levels of C3-C4 and C5-C6. Mild to moderate severity bilateral multilevel facet joint hypertrophy is noted. Upper chest: Negative. Other: There is marked severity right maxillary sinus mucosal thickening. IMPRESSION: 1. No acute osseous abnormality. 2. Moderate severity multilevel degenerative changes, most prominent at the levels of C3-C4 and C5-C6. 3. Marked severity right maxillary sinus disease. Electronically Signed   By: Virgina Norfolk M.D.   On: 04/09/2020 18:26   DG Knee Complete 4 Views Left  Result Date: 04/09/2020 CLINICAL DATA:  Bilateral leg  pain and weakness x4 days. EXAM: LEFT KNEE - COMPLETE 4+ VIEW COMPARISON:  None. FINDINGS: A left knee replacement is  seen without evidence of surrounding lucency to suggest the presence of hardware loosening or infection. No evidence of an acute fracture or dislocation. There is moderate severity vascular calcification. A small joint effusion is noted. IMPRESSION: 1. Left knee replacement without evidence of hardware loosening or infection. 2. Small joint effusion. Electronically Signed   By: Virgina Norfolk M.D.   On: 04/09/2020 18:10   DG Hip Unilat W or Wo Pelvis 2-3 Views Left  Result Date: 04/09/2020 CLINICAL DATA:  Bilateral leg pain and weakness x4 days. EXAM: DG HIP (WITH OR WITHOUT PELVIS) 2-3V LEFT COMPARISON:  October 27, 2004 FINDINGS: There is no evidence of hip fracture or dislocation. Mild degenerative changes are seen in the form of joint space narrowing and acetabular sclerosis. Marked severity vascular calcification is noted. IMPRESSION: No acute osseous abnormality. Electronically Signed   By: Virgina Norfolk M.D.   On: 04/09/2020 18:08    Patient's laboratory evaluations overall been reassuring, no significant electrolyte derangements, renal function at baseline, no anemia, lactic acid is normal, troponin normal, no evidence of UTI.  Patient's imaging has been reassuring as well.  X-rays of the hip and knee are unremarkable and CTs of the head and cervical spine with no acute injury.  Given patient's weakness with multiple falls, and known history of spinal stenosis, feel she will need MRIs of the lumbar spine as well as the brain for further evaluation, unfortunately because patient has pacemaker this will need to be done at [facility] and cannot be done until tomorrow morning when pacemaker team is available.  Patient will need hospital admission for further evaluation of weakness.  Case discussed with Dr. Jennette Kettle with [facility] hospitalist who will see and admit the patient.   Jacqlyn Larsen, PA-C 04/10/20 [id number]    Lorelle Gibbs, DO 04/10/20 1320

## 2020-04-10 NOTE — Progress Notes (Signed)
PROGRESS NOTE    Tara Jackson   JXB:[id number]  DOB: 05/19/1940  DOA: 04/09/2020     0  PCP: Shon Baton, MD  CC: LE weakness  Hospital Course: Tara Jackson is an 80 yo female with PMH CVA, spinal stenosis, RLS, osteoporosis, hypothyroidism, hypertension, hyperlipidemia, glaucoma, DVT/PE, diabetes, depression/anxiety, CKD 3B who presented to the hospital with lower extremity weakness.  There was concern for possible stroke and she was transferred from [place] to [facility] for MRI due to underlying pacemaker.  Unfortunately, after evaluation by radiology, her pacemaker was not MRI compatible.  She underwent further work-up regarding her weakness and was found to have low vitamin B12 which may be contributing.  She was placed on high-dose replacement during hospitalization.  Neurology was also consulted for further recommendations. She underwent CT head on admission which was negative for acute abnormalities.  Did show cerebral atrophy and microvascular disease.   Interval History:  Seen bedside this morning, husband present also.  Explained further work-up needs to continue regarding her weakness especially with inability to obtain MRI.  She was slightly anxious to go home and was declining rehab but understood need for work-up.  ROS: Constitutional: negative for chills and fevers, Respiratory: negative for cough, Cardiovascular: negative for chest pain and Gastrointestinal: negative for abdominal pain  Assessment & Plan: * Lower extremity weakness -Weakness is bilateral, slightly worse than the left which she says is typical for her.  Unable to undergo MRI due to incompatibility of pacer - checking B12, 285 (actually considered low and could be playing a significant role in her weakness, especially as stroke suspicion is not extremely high at this time); patient reports her weakness has been progressive over several weeks and is typically dependent on a hoverround at baseline but lately  has been need husband to help assist her into her WC - check MMA level  - TSH normal, 1.238 - start on daily SQ B12 and will need ongoing replacement at d/c (only on 100 mcg daily at home, likely not enough) - neurology also consulted; await further rec's  Hyperlipidemia -Continue Crestor -LDL 38  Anxiety and depression - flat affect noted - continue buspar, klonopin, nortriptyline  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 Baseline creat ~ 1.4 - 1.6, eGFR 32-34 - creat 1.64 on admission   Type 2 diabetes mellitus with renal manifestations (HCC) -Continue NPH and SSI -Continue CBG monitoring -A1c 8.4%  Essential hypertension - currently at goal with no meds (SBP 140s - 150s)   Old records reviewed in assessment of this patient  Antimicrobials: N/A  DVT prophylaxis: Lovenox   Code Status:   Code Status: Full Code Family Communication: Husband bedside  Disposition Plan: Status is: Observation  The patient remains OBS appropriate and will d/c before 2 midnights.  Dispo: The patient is from: Home              Anticipated d/c is to: Home              Patient currently is not medically stable to d/c.   Difficult to place patient No  Risk of unplanned readmission score:     Objective: Blood pressure (!) 145/59, pulse 60, temperature 98 F (36.7 C), temperature source Oral, resp. rate 13, height 5' 7.5" (1.715 m), weight 115.6 kg, SpO2 99 %.  Examination: General appearance: alert, cooperative and no  distress Head: Normocephalic, without obvious abnormality, atraumatic Eyes: EOMI Lungs: clear to auscultation bilaterally Heart: regular rate and rhythm and S1, S2 normal Abdomen: normal findings: bowel sounds normal and soft, non-tender Extremities: No edema Skin: mobility and turgor normal Neurologic: could not appreciate any focal weakness on exam; overall  symmetric and essentially 5/5. Some subjective paresthesia in RUE Psych: flat affect  Consultants:   Neurology  Procedures:   n/a  Data Reviewed: I have personally reviewed following labs and imaging studies Results for orders placed or performed during the hospital encounter of 04/09/20 (from the past 24 hour(s))  Comprehensive metabolic panel     Status: Abnormal   Collection Time: 04/09/20  5:15 PM  Result Value Ref Range   Sodium 138 135 - 145 mmol/L   Potassium 4.0 3.5 - 5.1 mmol/L   Chloride 102 98 - 111 mmol/L   CO2 25 22 - 32 mmol/L   Glucose, Bld 227 (H) 70 - 99 mg/dL   BUN 18 8 - 23 mg/dL   Creatinine, Ser 1.64 (H) 0.44 - 1.00 mg/dL   Calcium 9.4 8.9 - 10.3 mg/dL   Total Protein 7.1 6.5 - 8.1 g/dL   Albumin 3.7 3.5 - 5.0 g/dL   AST 20 15 - 41 U/L   ALT 13 0 - 44 U/L   Alkaline Phosphatase 63 38 - 126 U/L   Total Bilirubin 0.6 0.3 - 1.2 mg/dL   GFR, Estimated 31 (L) >60 mL/min   Anion gap 11 5 - 15  CBC with Differential     Status: None   Collection Time: 04/09/20  5:15 PM  Result Value Ref Range   WBC 6.4 4.0 - 10.5 K/uL   RBC 3.91 3.87 - 5.11 MIL/uL   Hemoglobin 12.2 12.0 - 15.0 g/dL   HCT 39.1 36.0 - 46.0 %   MCV 100.0 80.0 - 100.0 fL   MCH 31.2 26.0 - 34.0 pg   MCHC 31.2 30.0 - 36.0 g/dL   RDW 12.3 11.5 - 15.5 %   Platelets 210 150 - 400 K/uL   nRBC 0.0 0.0 - 0.2 %   Neutrophils Relative % 70 %   Neutro Abs 4.5 1.7 - 7.7 K/uL   Lymphocytes Relative 20 %   Lymphs Abs 1.3 0.7 - 4.0 K/uL   Monocytes Relative 7 %   Monocytes Absolute 0.4 0.1 - 1.0 K/uL   Eosinophils Relative 2 %   Eosinophils Absolute 0.1 0.0 - 0.5 K/uL   Basophils Relative 1 %   Basophils Absolute 0.1 0.0 - 0.1 K/uL   Immature Granulocytes 0 %   Abs Immature Granulocytes 0.02 0.00 - 0.07 K/uL  Troponin I (High Sensitivity)     Status: None   Collection Time: 04/09/20  5:15 PM  Result Value Ref Range   Troponin I (High Sensitivity) 5 <18 ng/L  Lactic acid, plasma     Status: None    Collection Time: 04/09/20  5:15 PM  Result Value Ref Range   Lactic Acid, Venous 1.5 0.5 - 1.9 mmol/L  Magnesium     Status: None   Collection Time: 04/09/20  5:15 PM  Result Value Ref Range   Magnesium 1.9 1.7 - 2.4 mg/dL  Troponin I (High Sensitivity)     Status: None   Collection Time: 04/09/20  7:13 PM  Result Value Ref Range   Troponin I (High Sensitivity) 5 <18 ng/L  CBG monitoring, ED     Status: Abnormal   Collection Time: 04/09/20  7:20 PM  Result Value Ref Range   Glucose-Capillary 128 (H) 70 - 99 mg/dL  Urinalysis, Routine w reflex microscopic     Status: None   Collection Time: 04/09/20  7:47 PM  Result Value Ref Range   Color, Urine YELLOW YELLOW   APPearance CLEAR CLEAR   Specific Gravity, Urine 1.013 1.005 - 1.030   pH 5.0 5.0 - 8.0   Glucose, UA NEGATIVE NEGATIVE mg/dL   Hgb urine dipstick NEGATIVE NEGATIVE   Bilirubin Urine NEGATIVE NEGATIVE   Ketones, ur NEGATIVE NEGATIVE mg/dL   Protein, ur NEGATIVE NEGATIVE mg/dL   Nitrite NEGATIVE NEGATIVE   Leukocytes,Ua NEGATIVE NEGATIVE   RBC / HPF 0-5 0 - 5 RBC/hpf   WBC, UA 0-5 0 - 5 WBC/hpf   Bacteria, UA NONE SEEN NONE SEEN   Squamous Epithelial / LPF 0-5 0 - 5  SARS CORONAVIRUS 2 (TAT 6-24 HRS) Nasopharyngeal Nasopharyngeal Swab     Status: None   Collection Time: 04/09/20  8:50 PM   Specimen: Nasopharyngeal Swab  Result Value Ref Range   SARS Coronavirus 2 NEGATIVE NEGATIVE  CBG monitoring, ED     Status: Abnormal   Collection Time: 04/09/20 10:39 PM  Result Value Ref Range   Glucose-Capillary 169 (H) 70 - 99 mg/dL   Comment 1 Notify RN   Lactic acid, plasma     Status: None   Collection Time: 04/10/20  2:14 AM  Result Value Ref Range   Lactic Acid, Venous 1.0 0.5 - 1.9 mmol/L  Hemoglobin A1c     Status: Abnormal   Collection Time: 04/10/20  2:14 AM  Result Value Ref Range   Hgb A1c MFr Bld 8.4 (H) 4.8 - 5.6 %   Mean Plasma Glucose 194.38 mg/dL  Lipid panel     Status: None   Collection Time:  04/10/20  2:14 AM  Result Value Ref Range   Cholesterol 119 0 - 200 mg/dL   Triglycerides 71 <150 mg/dL   HDL 67 >40 mg/dL   Total CHOL/HDL Ratio 1.8 RATIO   VLDL 14 0 - 40 mg/dL   LDL Cholesterol 38 0 - 99 mg/dL  Glucose, capillary     Status: Abnormal   Collection Time: 04/10/20  2:57 AM  Result Value Ref Range   Glucose-Capillary 204 (H) 70 - 99 mg/dL  Glucose, capillary     Status: Abnormal   Collection Time: 04/10/20  7:27 AM  Result Value Ref Range   Glucose-Capillary 239 (H) 70 - 99 mg/dL  Vitamin B12     Status: None   Collection Time: 04/10/20  9:11 AM  Result Value Ref Range   Vitamin B-12 285 180 - 914 pg/mL  TSH     Status: None   Collection Time: 04/10/20  9:11 AM  Result Value Ref Range   TSH 1.238 0.350 - 4.500 uIU/mL  Folate     Status: None   Collection Time: 04/10/20  9:11 AM  Result Value Ref Range   Folate 32.4 >5.9 ng/mL  Glucose, capillary     Status: Abnormal   Collection Time: 04/10/20 11:45 AM  Result Value Ref Range   Glucose-Capillary 276 (H) 70 - 99 mg/dL    Recent Results (from the past 240 hour(s))  SARS CORONAVIRUS 2 (TAT 6-24 HRS) Nasopharyngeal Nasopharyngeal Swab     Status: None   Collection Time: 04/09/20  8:50 PM   Specimen: Nasopharyngeal Swab  Result Value Ref Range Status   SARS Coronavirus 2 NEGATIVE NEGATIVE  Final    Comment: (NOTE) SARS-CoV-2 target nucleic acids are NOT DETECTED.  The SARS-CoV-2 RNA is generally detectable in upper and lower respiratory specimens during the acute phase of infection. Negative results do not preclude SARS-CoV-2 infection, do not rule out co-infections with other pathogens, and should not be used as the sole basis for treatment or other patient management decisions. Negative results must be combined with clinical observations, patient history, and epidemiological information. The expected result is Negative.  Fact Sheet for Patients: SugarRoll.be  Fact Sheet  for Healthcare Providers: https://www.woods-mathews.com/  This test is not yet approved or cleared by the [place] FDA and  has been authorized for detection and/or diagnosis of SARS-CoV-2 by FDA under an Emergency Use Authorization (EUA). This EUA will remain  in effect (meaning this test can be used) for the duration of the COVID-19 declaration under Se ction 564(b)(1) of the Act, 21 U.S.C. section 360bbb-3(b)(1), unless the authorization is terminated or revoked sooner.  Performed at [facility], [place] [address] [place], [place] [address]      Radiology Studies: CT Head Wo Contrast  Result Date: 04/09/2020 CLINICAL DATA:  Headache and frequent falls. EXAM: CT HEAD WITHOUT CONTRAST TECHNIQUE: Contiguous axial images were obtained from the base of the skull through the vertex without intravenous contrast. COMPARISON:  March 19, 2019 FINDINGS: Brain: There is mild cerebral atrophy with widening of the extra-axial spaces and ventricular dilatation. There are areas of decreased attenuation within the white matter tracts of the supratentorial brain, consistent with microvascular disease changes. Vascular: No hyperdense vessel or unexpected calcification. Skull: Negative for an acute fracture or focal lesion. Sinuses/Orbits: There is marked severity right maxillary sinus mucosal thickening. Other: None. IMPRESSION: 1. No acute intracranial abnormality. 2. 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 3. Marked severity right maxillary sinus disease. Electronically Signed   By: Virgina Norfolk M.D.   On: 04/09/2020 18:24   CT Cervical Spine Wo Contrast  Result Date: 04/09/2020 CLINICAL DATA:  Bilateral leg pain and weakness with frequent falls. EXAM: CT CERVICAL SPINE WITHOUT CONTRAST TECHNIQUE: Multidetector CT imaging of the cervical spine was performed without intravenous contrast. Multiplanar CT image reconstructions were also  generated. COMPARISON:  March 19, 2019 FINDINGS: Alignment: Normal. Skull base and vertebrae: No acute fracture. No primary bone lesion or focal pathologic process. Soft tissues and spinal canal: No prevertebral fluid or swelling. No visible canal hematoma. Disc levels: Moderate severity endplate sclerosis is seen at the levels of C3-C4 and C5-C6. Mild endplate sclerosis is seen at the level of C6-C7. There is marked severity narrowing of the anterior atlantoaxial articulation. Moderate severity intervertebral disc space narrowing is seen at the levels of C3-C4 and C5-C6. Mild to moderate severity bilateral multilevel facet joint hypertrophy is noted. Upper chest: Negative. Other: There is marked severity right maxillary sinus mucosal thickening. IMPRESSION: 1. No acute osseous abnormality. 2. Moderate severity multilevel degenerative changes, most prominent at the levels of C3-C4 and C5-C6. 3. Marked severity right maxillary sinus disease. Electronically Signed   By: Virgina Norfolk M.D.   On: 04/09/2020 18:26   DG Knee Complete 4 Views Left  Result Date: 04/09/2020 CLINICAL DATA:  Bilateral leg pain and weakness x4 days. EXAM: LEFT KNEE - COMPLETE 4+ VIEW COMPARISON:  None. FINDINGS: A left knee replacement is seen without evidence of surrounding lucency to suggest the presence of hardware loosening or infection. No evidence of an acute fracture or dislocation. There is moderate severity  vascular calcification. A small joint effusion is noted. IMPRESSION: 1. Left knee replacement without evidence of hardware loosening or infection. 2. Small joint effusion. Electronically Signed   By: Virgina Norfolk M.D.   On: 04/09/2020 18:10   DG Hip Unilat W or Wo Pelvis 2-3 Views Left  Result Date: 04/09/2020 CLINICAL DATA:  Bilateral leg pain and weakness x4 days. EXAM: DG HIP (WITH OR WITHOUT PELVIS) 2-3V LEFT COMPARISON:  October 27, 2004 FINDINGS: There is no evidence of hip fracture or dislocation. Mild  degenerative changes are seen in the form of joint space narrowing and acetabular sclerosis. Marked severity vascular calcification is noted. IMPRESSION: No acute osseous abnormality. Electronically Signed   By: Virgina Norfolk M.D.   On: 04/09/2020 18:08   CT Head Wo Contrast  Final Result    CT Cervical Spine Wo Contrast  Final Result    DG Hip Unilat W or Wo Pelvis 2-3 Views Left  Final Result    DG Knee Complete 4 Views Left  Final Result      Scheduled Meds: . acidophilus  1 capsule Oral Daily  . busPIRone  10 mg Oral q morning  . clonazePAM  1 mg Oral Daily   And  . clonazePAM  0.5 mg Oral QHS  . clopidogrel  75 mg Oral q morning  . cyanocobalamin  1,000 mcg Intramuscular Once  . [START ON 04/11/2020] cyanocobalamin  1,000 mcg Subcutaneous Daily  . darifenacin  7.5 mg Oral Daily  . dicyclomine  20 mg Oral QHS  . dicyclomine  40 mg Oral Daily  . donepezil  10 mg Oral QHS  . enoxaparin (LOVENOX) injection  60 mg Subcutaneous Q24H  . insulin aspart  0-15 Units Subcutaneous TID WC  . insulin aspart  0-5 Units Subcutaneous QHS  . insulin NPH Human  20 Units Subcutaneous QAC breakfast  . levothyroxine  100 mcg Oral Q0600  . linaclotide  290 mcg Oral QAC breakfast  . mometasone-formoterol  2 puff Inhalation BID  . nortriptyline  50 mg Oral BID  . pantoprazole  40 mg Oral Daily  . rosuvastatin  20 mg Oral Daily   PRN Meds: acetaminophen **OR** acetaminophen (TYLENOL) oral liquid 160 mg/5 mL **OR** acetaminophen, albuterol, guaiFENesin, polyethylene glycol Continuous Infusions:   LOS: 0 days  Time spent: Greater than 50% of the 35 minute visit was spent in counseling/coordination of care for the patient as laid out in the A&P.   Dwyane Dee, MD [facility] 04/10/2020, 3:28 PM

## 2020-04-11 DIAGNOSIS — R29898 Other symptoms and signs involving the musculoskeletal system: Secondary | ICD-10-CM | POA: Diagnosis not present

## 2020-04-11 DIAGNOSIS — E538 Deficiency of other specified B group vitamins: Secondary | ICD-10-CM | POA: Diagnosis not present

## 2020-04-11 LAB — CBC WITH DIFFERENTIAL/PLATELET
Abs Immature Granulocytes: 0.01 10*3/uL (ref 0.00–0.07)
Basophils Absolute: 0 10*3/uL (ref 0.0–0.1)
Basophils Relative: 1 %
Eosinophils Absolute: 0.1 10*3/uL (ref 0.0–0.5)
Eosinophils Relative: 3 %
HCT: 35.3 % — ABNORMAL LOW (ref 36.0–46.0)
Hemoglobin: 11.2 g/dL — ABNORMAL LOW (ref 12.0–15.0)
Immature Granulocytes: 0 %
Lymphocytes Relative: 36 %
Lymphs Abs: 1.8 10*3/uL (ref 0.7–4.0)
MCH: 31.4 pg (ref 26.0–34.0)
MCHC: 31.7 g/dL (ref 30.0–36.0)
MCV: 98.9 fL (ref 80.0–100.0)
Monocytes Absolute: 0.4 10*3/uL (ref 0.1–1.0)
Monocytes Relative: 7 %
Neutro Abs: 2.7 10*3/uL (ref 1.7–7.7)
Neutrophils Relative %: 53 %
Platelets: 210 10*3/uL (ref 150–400)
RBC: 3.57 MIL/uL — ABNORMAL LOW (ref 3.87–5.11)
RDW: 12.4 % (ref 11.5–15.5)
WBC: 5 10*3/uL (ref 4.0–10.5)
nRBC: 0 % (ref 0.0–0.2)

## 2020-04-11 LAB — BASIC METABOLIC PANEL
Anion gap: 11 (ref 5–15)
BUN: 15 mg/dL (ref 8–23)
CO2: 25 mmol/L (ref 22–32)
Calcium: 8.9 mg/dL (ref 8.9–10.3)
Chloride: 100 mmol/L (ref 98–111)
Creatinine, Ser: 1.49 mg/dL — ABNORMAL HIGH (ref 0.44–1.00)
GFR, Estimated: 35 mL/min — ABNORMAL LOW (ref >60)
Glucose, Bld: 191 mg/dL — ABNORMAL HIGH (ref 70–99)
Potassium: 3.8 mmol/L (ref 3.5–5.1)
Sodium: 136 mmol/L (ref 135–145)

## 2020-04-11 LAB — MAGNESIUM: Magnesium: 1.8 mg/dL (ref 1.7–2.4)

## 2020-04-11 LAB — URINE CULTURE

## 2020-04-11 LAB — GLUCOSE, CAPILLARY
Glucose-Capillary: 184 mg/dL — ABNORMAL HIGH (ref 70–99)
Glucose-Capillary: 190 mg/dL — ABNORMAL HIGH (ref 70–99)
Glucose-Capillary: 278 mg/dL — ABNORMAL HIGH (ref 70–99)

## 2020-04-11 MED ORDER — VITAMIN B-12 1000 MCG PO TABS: 1000.0000 ug | ORAL_TABLET | Freq: Every day | ORAL | Status: DC

## 2020-04-11 NOTE — Discharge Summary (Signed)
Physician Discharge Summary   Tara Jackson ELF:[id number] DOB: 1940-03-21 DOA: 04/09/2020  PCP: Shon Baton, MD  Admit date: 04/09/2020 Discharge date:  04/11/2020  Admitted From: home Disposition:  home Discharging physician: Dwyane Dee, MD  Recommendations for Outpatient Follow-up:  1. Repeat B12 level   Patient discharged to home in Discharge Condition: stable Risk of unplanned readmission score:    CODE STATUS: Full Diet recommendation:  Diet Orders (From admission, onward)    Start     Ordered   04/11/20 0000  Diet - low sodium heart healthy        04/11/20 1157   04/09/20 2106  Diet Carb Modified Fluid consistency: Thin; Room service appropriate? Yes  Diet effective now       Question Answer Comment  Diet-HS Snack? Nothing   Calorie Level Medium 1600-2000   Fluid consistency: Thin   Room service appropriate? Yes      04/09/20 2106          Hospital Course: Tara Jackson is an 80 yo female with PMH CVA, spinal stenosis, RLS, osteoporosis, hypothyroidism, hypertension, hyperlipidemia, glaucoma, DVT/PE, diabetes, depression/anxiety, CKD 3B who presented to the hospital with lower extremity weakness.  There was concern for possible stroke and she was transferred from [place] to [facility] for MRI due to underlying pacemaker.  Unfortunately, after evaluation by radiology, her pacemaker was not MRI compatible.  She underwent further work-up regarding her weakness and was found to have low vitamin B12 which may be contributing.  She was placed on high-dose replacement during hospitalization.  Neurology was also consulted for further recommendations. She underwent CT head on admission which was negative for acute abnormalities.  Did show cerebral atrophy and microvascular disease.  She was continued on higher dose oral vitamin B12 at discharge.  Home health was also continued.  She was evaluated by neurology and also recommended to continue with further B12 repletion, diabetes  control, physical therapy.  Stroke suspicion also still low and no further imaging studies warranted at this time.  She was discharged home in stable condition with ongoing outpatient home health.   * Lower extremity weakness -Weakness is bilateral, slightly worse than the left which she says is typical for her.  Unable to undergo MRI due to incompatibility of pacer - checking B12, 285 (actually considered low and could be playing a significant role in her weakness, especially as stroke suspicion is not extremely high at this time); patient reports her weakness has been progressive over several weeks and is typically dependent on a hoverround at baseline but lately has been need husband to help assist her into her WC - check MMA level: pending at discharge  - TSH normal, 1.238 - start on daily SQ B12 and will need ongoing replacement at d/c (only on 100 mcg daily at home, likely not enough) - neurology also consulted: agree with B12 repletion, HHPT, risk factor modification as able; no further imaging studies recommended  Hyperlipidemia -Continue Crestor -LDL 38  Anxiety and depression - flat affect noted - continue buspar, klonopin, nortriptyline  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 Baseline creat ~ 1.4 - 1.6, eGFR 32-34 - creat 1.64 on admission   Type 2 diabetes mellitus with renal manifestations (HCC) -Continue NPH and SSI -Continue CBG monitoring -A1c 8.4%  Essential hypertension - currently at goal with no meds (SBP 140s - 150s)  The patient's chronic medical conditions were treated accordingly per the patient's home medication regimen except as noted.  On day of discharge, patient was felt deemed stable for discharge. Patient/family member advised to call PCP or come back to ER if needed.   Principal Diagnosis: Lower extremity weakness  Discharge  Diagnoses: Active Hospital Problems   Diagnosis Date Noted  . Lower extremity weakness 04/09/2020    Priority: High  . Anxiety and depression 04/10/2020  . Hyperlipidemia 04/10/2020  . Pacemaker 04/26/2015  . Chronic kidney disease, stage 3b ([place]) 11/28/2014  . Type 2 diabetes mellitus with renal manifestations ([place]) 11/28/2014  . Essential hypertension 05/20/2008    Resolved Hospital Problems  No resolved problems to display.    Discharge Instructions    Diet - low sodium heart healthy   Complete by: As directed    Increase activity slowly   Complete by: As directed      Allergies as of 04/11/2020      Reactions   Iodine Other (See Comments)   ONLY IV--Makes unconscious   Iohexol Other (See Comments)    Code: SOB, Desc: cardiac arrest w/ iv contrast, has never used 13 hr prep//alice calhoun, Onset Date: [id number]   Metoclopramide Hcl Other (See Comments)   suicidal   Sulfa Antibiotics Hives   Lactose Intolerance (gi) Diarrhea   Lansoprazole Nausea And Vomiting      Medication List    TAKE these medications   Afrin Nasal Spray 0.05 % nasal spray Generic drug: oxymetazoline Place 1 spray into both nostrils 2 (two) times daily as needed for congestion.   albuterol 108 (90 Base) MCG/ACT inhaler Commonly known as: VENTOLIN HFA INHALE 2 INHALATIONS 3 TIMES A DAY AS NEEDED FOR COUGH/WHEEZE   Align 4 MG Caps Take 1 capsule by mouth daily.   budesonide-formoterol 80-4.5 MCG/ACT inhaler Commonly known as: SYMBICORT Inhale 2 puffs into the lungs 2 (two) times daily.   busPIRone 10 MG tablet Commonly known as: BUSPAR Take 1 tablet (10 mg total) by mouth every morning.   clonazePAM 0.5 MG tablet Commonly known as: KLONOPIN Take 0.5-1 mg by mouth See admin instructions. Takes 51m in am and 0.552min pm.   clopidogrel 75 MG tablet Commonly known as: PLAVIX Take 75 mg by mouth every morning.   dicyclomine 20 MG tablet Commonly known as: BENTYL Take 20-40 mg by mouth  in the morning and at bedtime. Take 2 tablets (40 mg) in the morning and Take 1 tablet (20 mg) at bedtime   donepezil 10 MG tablet Commonly known as: ARICEPT Take 1 tablet (10 mg total) by mouth at bedtime.   furosemide 20 MG tablet Commonly known as: LASIX Take 1 tablet (20 mg total) by mouth daily as needed for fluid or edema. As directed What changed: when to take this   glipiZIDE 10 MG tablet Commonly known as: GLUCOTROL Take 20 mg by mouth 2 (two) times daily before a meal.   guaiFENesin 600 MG 12 hr tablet Commonly known as: MUCINEX Take 600 mg by mouth 2 (two) times daily as needed for cough.   insulin NPH Human 100 UNIT/ML injection Commonly known as: NOVOLIN N Inject 0-20 Units into the skin See admin instructions. Novolin-N Give 20 unit in morning; give  5 units at 4pm; evening dose as needed.  If less than 150 blood sugar= 0units.  If greater than 200 blood sugar= give 10units to decrease by 100 blood sugar   insulin regular 100 units/mL injection Commonly  known as: NOVOLIN R Inject 0-30 Units into the skin See admin instructions. Give 25 unit in morning; give 20units at 4pm; evening dose as needed.  If less than 150 blood sugar= 0units.  If greater than 200 blood sugar= give 10units to decrease by 100 blood sugar   levothyroxine 100 MCG tablet Commonly known as: SYNTHROID Take 100 mcg by mouth daily before breakfast.   lidocaine 5 % Commonly known as: LIDODERM Place 3 patches onto the skin daily as needed (pain). Remove & Discard patch within 12 hours or as directed by MD   linaclotide 290 MCG Caps capsule Commonly known as: LINZESS Take 290 mcg by mouth daily before breakfast.   Muscle Rub 10-15 % Crea Apply 1 application topically as needed for muscle pain.   nortriptyline 50 MG capsule Commonly known as: PAMELOR TAKE 2 CAPSULES (100MG) AT BEDTIME What changed: See the new instructions.   OXYGEN Inhale 2 L into the lungs as needed. Used at overnight with  a concentrator   pantoprazole 40 MG tablet Commonly known as: PROTONIX Take 1 tablet (40 mg total) by mouth daily.   polyethylene glycol 17 g packet Commonly known as: MIRALAX / GLYCOLAX Take 17 g by mouth daily as needed for mild constipation.   potassium chloride SA 20 MEQ tablet Commonly known as: KLOR-CON Take 1 tablet (20 mEq total) by mouth 2 (two) times daily.   rosuvastatin 20 MG tablet Commonly known as: CRESTOR Take 20 mg by mouth daily.   solifenacin 5 MG tablet Commonly known as: VESICARE Take 5 mg by mouth daily.   terconazole 0.4 % vaginal cream Commonly known as: TERAZOL 7 Place 1 applicator vaginally daily as needed for itching.   vitamin B-12 1000 MCG tablet Commonly known as: CYANOCOBALAMIN Take 1 tablet (1,000 mcg total) by mouth daily. What changed:   medication strength  how much to take   VITAMIN D-3 PO Take 2 tablets by mouth every morning.       Allergies  Allergen Reactions  . Iodine Other (See Comments)    ONLY IV--Makes unconscious  . Iohexol Other (See Comments)     Code: SOB, Desc: cardiac arrest w/ iv contrast, has never used 13 hr prep//alice calhoun, Onset Date: 96789381   . Metoclopramide Hcl Other (See Comments)    suicidal  . Sulfa Antibiotics Hives  . Lactose Intolerance (Gi) Diarrhea  . Lansoprazole Nausea And Vomiting    Consultations: Neurology   Discharge Exam: BP (!) 154/61 (BP Location: Right Arm)   Pulse 60   Temp 97.9 F (36.6 C) (Axillary)   Resp 17   Ht 5' 7.5" (1.715 m)   Wt 116.6 kg   SpO2 96%   BMI 39.67 kg/m  General appearance: alert, cooperative and no distress Head: Normocephalic, without obvious abnormality, atraumatic Eyes: EOMI Lungs: clear to auscultation bilaterally Heart: regular rate and rhythm and S1, S2 normal Abdomen: normal findings: bowel sounds normal and soft, non-tender Extremities: No edema Skin: mobility and turgor normal Neurologic: could not appreciate any focal  weakness on exam; overall symmetric and essentially 5/5. Some subjective paresthesia in RUE and LLE Psych: flat affect  The results of significant diagnostics from this hospitalization (including imaging, microbiology, ancillary and laboratory) are listed below for reference.   Microbiology: Recent Results (from the past 240 hour(s))  Urine culture     Status: Abnormal   Collection Time: 04/09/20  7:47 PM   Specimen: Urine, Random  Result Value Ref Range Status  Specimen Description   Final    URINE, RANDOM Performed at [facility], [place] [address], [place], [place] [address]    Special Requests   Final    NONE Performed at [facility], [place] [address] [place], [place] [address]    Culture MULTIPLE SPECIES PRESENT, SUGGEST RECOLLECTION (A)  Final   Report Status 04/11/2020 FINAL  Final  SARS CORONAVIRUS 2 (TAT 6-24 HRS) Nasopharyngeal Nasopharyngeal Swab     Status: None   Collection Time: 04/09/20  8:50 PM   Specimen: Nasopharyngeal Swab  Result Value Ref Range Status   SARS Coronavirus 2 NEGATIVE NEGATIVE Final    Comment: (NOTE) SARS-CoV-2 target nucleic acids are NOT DETECTED.  The SARS-CoV-2 RNA is generally detectable in upper and lower respiratory specimens during the acute phase of infection. Negative results do not preclude SARS-CoV-2 infection, do not rule out co-infections with other pathogens, and should not be used as the sole basis for treatment or other patient management decisions. Negative results must be combined with clinical observations, patient history, and epidemiological information. The expected result is Negative.  Fact Sheet for Patients: SugarRoll.be  Fact Sheet for Healthcare Providers: https://www.woods-mathews.com/  This test is not yet approved or cleared by the [place] FDA and  has been authorized for detection and/or diagnosis of SARS-CoV-2 by FDA under  an Emergency Use Authorization (EUA). This EUA will remain  in effect (meaning this test can be used) for the duration of the COVID-19 declaration under Se ction 564(b)(1) of the Act, 21 U.S.C. section 360bbb-3(b)(1), unless the authorization is terminated or revoked sooner.  Performed at [facility], [place] [address], [place], [place] [address]      Labs: BNP (last 3 results) No results for input(s): BNP in the last 8760 hours. Basic Metabolic Panel: Recent Labs  Lab 04/09/20 1715 04/11/20 0328  NA 138 136  K 4.0 3.8  CL 102 100  CO2 25 25  GLUCOSE 227* 191*  BUN 18 15  CREATININE 1.64* 1.49*  CALCIUM 9.4 8.9  MG 1.9 1.8   Liver Function Tests: Recent Labs  Lab 04/09/20 1715  AST 20  ALT 13  ALKPHOS 63  BILITOT 0.6  PROT 7.1  ALBUMIN 3.7   No results for input(s): LIPASE, AMYLASE in the last 168 hours. No results for input(s): AMMONIA in the last 168 hours. CBC: Recent Labs  Lab 04/09/20 1715 04/11/20 0328  WBC 6.4 5.0  NEUTROABS 4.5 2.7  HGB 12.2 11.2*  HCT 39.1 35.3*  MCV 100.0 98.9  PLT 210 210   Cardiac Enzymes: No results for input(s): CKTOTAL, CKMB, CKMBINDEX, TROPONINI in the last 168 hours. BNP: Invalid input(s): POCBNP CBG: Recent Labs  Lab 04/10/20 1708 04/10/20 2054 04/11/20 0345 04/11/20 0734 04/11/20 1151  GLUCAP 145* 226* 184* 190* 278*   D-Dimer No results for input(s): DDIMER in the last 72 hours. Hgb A1c Recent Labs    04/10/20 0214  HGBA1C 8.4*   Lipid Profile Recent Labs    04/10/20 0214  CHOL 119  HDL 67  LDLCALC 38  TRIG 71  CHOLHDL 1.8   Thyroid function studies Recent Labs    04/10/20 0911  TSH 1.238   Anemia work up Recent Labs    04/10/20 0911  VITAMINB12 285  FOLATE 32.4   Urinalysis    Component Value Date/Time   COLORURINE YELLOW 04/09/2020 [place] 04/09/2020 1947   LABSPEC 1.013 04/09/2020 [place]  5.0 04/09/2020 1947   GLUCOSEU NEGATIVE 04/09/2020 1947    HGBUR NEGATIVE 04/09/2020 [place] NEGATIVE 04/09/2020 [place] 04/09/2020 1947   PROTEINUR NEGATIVE 04/09/2020 1947   UROBILINOGEN 1.0 11/28/2014 1125   NITRITE NEGATIVE 04/09/2020 1947   LEUKOCYTESUR NEGATIVE 04/09/2020 1947   Sepsis Labs Invalid input(s): PROCALCITONIN,  WBC,  LACTICIDVEN Microbiology Recent Results (from the past 240 hour(s))  Urine culture     Status: Abnormal   Collection Time: 04/09/20  7:47 PM   Specimen: Urine, Random  Result Value Ref Range Status   Specimen Description   Final    URINE, RANDOM Performed at [facility], [place] [address], [place], [place] [address]    Special Requests   Final    NONE Performed at [facility], [place] [address] [place], [place] [address]    Culture MULTIPLE SPECIES PRESENT, SUGGEST RECOLLECTION (A)  Final   Report Status 04/11/2020 FINAL  Final  SARS CORONAVIRUS 2 (TAT 6-24 HRS) Nasopharyngeal Nasopharyngeal Swab     Status: None   Collection Time: 04/09/20  8:50 PM   Specimen: Nasopharyngeal Swab  Result Value Ref Range Status   SARS Coronavirus 2 NEGATIVE NEGATIVE Final    Comment: (NOTE) SARS-CoV-2 target nucleic acids are NOT DETECTED.  The SARS-CoV-2 RNA is generally detectable in upper and lower respiratory specimens during the acute phase of infection. Negative results do not preclude SARS-CoV-2 infection, do not rule out co-infections with other pathogens, and should not be used as the sole basis for treatment or other patient management decisions. Negative results must be combined with clinical observations, patient history, and epidemiological information. The expected result is Negative.  Fact Sheet for Patients: SugarRoll.be  Fact Sheet for Healthcare Providers: https://www.woods-mathews.com/  This test is not yet approved or cleared by the [place] FDA and  has been authorized for  detection and/or diagnosis of SARS-CoV-2 by FDA under an Emergency Use Authorization (EUA). This EUA will remain  in effect (meaning this test can be used) for the duration of the COVID-19 declaration under Se ction 564(b)(1) of the Act, 21 U.S.C. section 360bbb-3(b)(1), unless the authorization is terminated or revoked sooner.  Performed at [facility], [place] [address], [place], [place] [address]     Procedures/Studies: CT Head Wo Contrast  Result Date: 04/09/2020 CLINICAL DATA:  Headache and frequent falls. EXAM: CT HEAD WITHOUT CONTRAST TECHNIQUE: Contiguous axial images were obtained from the base of the skull through the vertex without intravenous contrast. COMPARISON:  March 19, 2019 FINDINGS: Brain: There is mild cerebral atrophy with widening of the extra-axial spaces and ventricular dilatation. There are areas of decreased attenuation within the white matter tracts of the supratentorial brain, consistent with microvascular disease changes. Vascular: No hyperdense vessel or unexpected calcification. Skull: Negative for an acute fracture or focal lesion. Sinuses/Orbits: There is marked severity right maxillary sinus mucosal thickening. Other: None. IMPRESSION: 1. No acute intracranial abnormality. 2. 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 3. Marked severity right maxillary sinus disease. Electronically Signed   By: Virgina Norfolk M.D.   On: 04/09/2020 18:24   CT Cervical Spine Wo Contrast  Result Date: 04/09/2020 CLINICAL DATA:  Bilateral leg pain and weakness with frequent falls. EXAM: CT CERVICAL SPINE WITHOUT CONTRAST TECHNIQUE: Multidetector CT imaging of the cervical spine was performed without intravenous contrast. Multiplanar CT image reconstructions were also generated. COMPARISON:  March 19, 2019 FINDINGS: Alignment: Normal. Skull base  and vertebrae: No acute fracture. No primary bone lesion or focal pathologic process. Soft  tissues and spinal canal: No prevertebral fluid or swelling. No visible canal hematoma. Disc levels: Moderate severity endplate sclerosis is seen at the levels of C3-C4 and C5-C6. Mild endplate sclerosis is seen at the level of C6-C7. There is marked severity narrowing of the anterior atlantoaxial articulation. Moderate severity intervertebral disc space narrowing is seen at the levels of C3-C4 and C5-C6. Mild to moderate severity bilateral multilevel facet joint hypertrophy is noted. Upper chest: Negative. Other: There is marked severity right maxillary sinus mucosal thickening. IMPRESSION: 1. No acute osseous abnormality. 2. Moderate severity multilevel degenerative changes, most prominent at the levels of C3-C4 and C5-C6. 3. Marked severity right maxillary sinus disease. Electronically Signed   By: Virgina Norfolk M.D.   On: 04/09/2020 18:26   DG Knee Complete 4 Views Left  Result Date: 04/09/2020 CLINICAL DATA:  Bilateral leg pain and weakness x4 days. EXAM: LEFT KNEE - COMPLETE 4+ VIEW COMPARISON:  None. FINDINGS: A left knee replacement is seen without evidence of surrounding lucency to suggest the presence of hardware loosening or infection. No evidence of an acute fracture or dislocation. There is moderate severity vascular calcification. A small joint effusion is noted. IMPRESSION: 1. Left knee replacement without evidence of hardware loosening or infection. 2. Small joint effusion. Electronically Signed   By: Virgina Norfolk M.D.   On: 04/09/2020 18:10   CUP PACEART REMOTE DEVICE CHECK  Result Date: 03/19/2020 Scheduled remote reviewed. Normal device function.  Next remote 91 days. Kathy Breach, RN, CCDS, [employer]  OCT, Retina - [place] - Both Eyes  Result Date: 04/03/2020 Right Eye Quality was good. Central Foveal Thickness: 203. Progression has been stable. Findings include normal foveal contour, no IRF, no SRF, pigment epithelial detachment, epiretinal membrane, macular pucker,  retinal drusen , outer retinal atrophy, vitreomacular adhesion  (stable). Left Eye Quality was good. Central Foveal Thickness: 211. Progression has been stable. Findings include pigment epithelial detachment, normal foveal contour, no IRF, no SRF, retinal drusen , vitreomacular adhesion . 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 CME - cystoid macular edema. PED - pigment epithelial detachment. IRF - intraretinal fluid. SRF - subretinal fluid. EZ - ellipsoid zone. ERM - epiretinal membrane. ORA - outer retinal atrophy. ORT - outer retinal tubulation. SRHM - subretinal hyper-reflective material   DG Hip Unilat W or Wo Pelvis 2-3 Views Left  Result Date: 04/09/2020 CLINICAL DATA:  Bilateral leg pain and weakness x4 days. EXAM: DG HIP (WITH OR WITHOUT PELVIS) 2-3V LEFT COMPARISON:  October 27, 2004 FINDINGS: There is no evidence of hip fracture or dislocation. Mild degenerative changes are seen in the form of joint space narrowing and acetabular sclerosis. Marked severity vascular calcification is noted. IMPRESSION: No acute osseous abnormality. Electronically Signed   By: Virgina Norfolk M.D.   On: 04/09/2020 18:08     Time coordinating discharge: Over 30 minutes    Dwyane Dee, MD  [facility] 04/11/2020, 2:31 PM

## 2020-04-11 NOTE — Plan of Care (Signed)
  Problem: Education: Goal: Knowledge of General Education information will improve Description: Including pain rating scale, medication(s)/side effects and non-pharmacologic comfort measures Outcome: Adequate for Discharge   Problem: Nutrition: Goal: Adequate nutrition will be maintained Outcome: Adequate for Discharge   Problem: Coping: Goal: Level of anxiety will decrease Outcome: Adequate for Discharge   Problem: Elimination: Goal: Will not experience complications related to bowel motility Outcome: Adequate for Discharge Goal: Will not experience complications related to urinary retention Outcome: Adequate for Discharge   Problem: Pain Managment: Goal: General experience of comfort will improve Outcome: Adequate for Discharge   Problem: Safety: Goal: Ability to remain free from injury will improve Outcome: Adequate for Discharge   Problem: Skin Integrity: Goal: Risk for impaired skin integrity will decrease Outcome: Adequate for Discharge   Problem: Education: Goal: Knowledge of disease or condition will improve Outcome: Adequate for Discharge Goal: Knowledge of secondary prevention will improve Outcome: Adequate for Discharge Goal: Knowledge of patient specific risk factors addressed and post discharge goals established will improve Outcome: Adequate for Discharge Goal: Individualized Educational Video(s) Outcome: Adequate for Discharge   Problem: Coping: Goal: Will verbalize positive feelings about self Outcome: Adequate for Discharge Goal: Will identify appropriate support needs Outcome: Adequate for Discharge   Problem: Health Behavior/Discharge Planning: Goal: Ability to manage health-related needs will improve Outcome: Adequate for Discharge

## 2020-04-11 NOTE — TOC Transition Note (Signed)
Transition of Care [facility]) - CM/SW Discharge Note   Patient Details  Name: Tara Jackson MRN: [id number] Date of Birth: [birth year]  [facility] CM/SW Contact:  Carles Collet, RN Phone Number: 04/11/2020, 12:32 PM   Clinical Narrative:   Damaris Schooner w patient, discussed [place]. [facility] Lemmings unable to take, referral placed to [facility] who could accept with start date Friday/ Saturday with a chance it could be earlier. Patient made aware. No DME needs, patient has RW WC and oxygen at home, spouse to provide transportation home    Final next level of care: [place] Barriers to Discharge: No Barriers Identified   Patient Goals and CMS Choice Patient states their goals for this hospitalization and ongoing recovery are:: to go home CMS Medicare.gov Compare Post Acute Care list provided to:: Patient Choice offered to / list presented to : Patient  Discharge Placement                       Discharge Plan and Services                          HH Arranged: PT,OT [place]: [facility]now [facility]) Date [place]: 04/11/20 Time [place]: 1232 Representative spoke with at [place]: [place]  Social Determinants of Health ([place]) Interventions     Readmission Risk Interventions No flowsheet data found.

## 2020-04-16 LAB — METHYLMALONIC ACID, SERUM: Methylmalonic Acid, Quantitative: 418 nmol/L — ABNORMAL HIGH (ref 0–378)

## 2020-06-16 ENCOUNTER — Encounter (HOSPITAL_COMMUNITY): Payer: Self-pay | Admitting: Emergency Medicine

## 2020-06-16 ENCOUNTER — Emergency Department (HOSPITAL_COMMUNITY)
Admission: EM | Admit: 2020-06-16 | Discharge: 2020-06-17 | Disposition: A | Discharge status: Home / Self Care | Payer: Medicare HMO | Attending: Emergency Medicine | Admitting: Emergency Medicine

## 2020-06-16 ENCOUNTER — Emergency Department (HOSPITAL_COMMUNITY): Payer: Medicare HMO

## 2020-06-16 DIAGNOSIS — E1122 Type 2 diabetes mellitus with diabetic chronic kidney disease: Secondary | ICD-10-CM | POA: Diagnosis not present

## 2020-06-16 DIAGNOSIS — I2510 Atherosclerotic heart disease of native coronary artery without angina pectoris: Secondary | ICD-10-CM | POA: Insufficient documentation

## 2020-06-16 DIAGNOSIS — Z20822 Contact with and (suspected) exposure to covid-19: Secondary | ICD-10-CM | POA: Diagnosis not present

## 2020-06-16 DIAGNOSIS — Z794 Long term (current) use of insulin: Secondary | ICD-10-CM | POA: Diagnosis not present

## 2020-06-16 DIAGNOSIS — Z7902 Long term (current) use of antithrombotics/antiplatelets: Secondary | ICD-10-CM | POA: Diagnosis not present

## 2020-06-16 DIAGNOSIS — R0602 Shortness of breath: Secondary | ICD-10-CM | POA: Insufficient documentation

## 2020-06-16 DIAGNOSIS — Z7722 Contact with and (suspected) exposure to environmental tobacco smoke (acute) (chronic): Secondary | ICD-10-CM | POA: Insufficient documentation

## 2020-06-16 DIAGNOSIS — I5032 Chronic diastolic (congestive) heart failure: Secondary | ICD-10-CM | POA: Insufficient documentation

## 2020-06-16 DIAGNOSIS — I130 Hypertensive heart and chronic kidney disease with heart failure and stage 1 through stage 4 chronic kidney disease, or unspecified chronic kidney disease: Secondary | ICD-10-CM | POA: Diagnosis not present

## 2020-06-16 DIAGNOSIS — Z950 Presence of cardiac pacemaker: Secondary | ICD-10-CM | POA: Diagnosis not present

## 2020-06-16 DIAGNOSIS — Z96652 Presence of left artificial knee joint: Secondary | ICD-10-CM | POA: Diagnosis not present

## 2020-06-16 DIAGNOSIS — Z96651 Presence of right artificial knee joint: Secondary | ICD-10-CM | POA: Insufficient documentation

## 2020-06-16 DIAGNOSIS — Z7984 Long term (current) use of oral hypoglycemic drugs: Secondary | ICD-10-CM | POA: Insufficient documentation

## 2020-06-16 DIAGNOSIS — N183 Chronic kidney disease, stage 3 unspecified: Secondary | ICD-10-CM | POA: Insufficient documentation

## 2020-06-16 DIAGNOSIS — Z79899 Other long term (current) drug therapy: Secondary | ICD-10-CM | POA: Diagnosis not present

## 2020-06-16 DIAGNOSIS — E039 Hypothyroidism, unspecified: Secondary | ICD-10-CM | POA: Diagnosis not present

## 2020-06-16 DIAGNOSIS — R059 Cough, unspecified: Secondary | ICD-10-CM | POA: Diagnosis present

## 2020-06-16 LAB — CBC
HCT: 39.6 % (ref 36.0–46.0)
Hemoglobin: 12.3 g/dL (ref 12.0–15.0)
MCH: 31.7 pg (ref 26.0–34.0)
MCHC: 31.1 g/dL (ref 30.0–36.0)
MCV: 102.1 fL — ABNORMAL HIGH (ref 80.0–100.0)
Platelets: 228 10*3/uL (ref 150–400)
RBC: 3.88 MIL/uL (ref 3.87–5.11)
RDW: 12.2 % (ref 11.5–15.5)
WBC: 11.4 10*3/uL — ABNORMAL HIGH (ref 4.0–10.5)
nRBC: 0 % (ref 0.0–0.2)

## 2020-06-16 LAB — BASIC METABOLIC PANEL
Anion gap: 10 (ref 5–15)
BUN: 20 mg/dL (ref 8–23)
CO2: 26 mmol/L (ref 22–32)
Calcium: 8.8 mg/dL — ABNORMAL LOW (ref 8.9–10.3)
Chloride: 98 mmol/L (ref 98–111)
Creatinine, Ser: 1.72 mg/dL — ABNORMAL HIGH (ref 0.44–1.00)
GFR, Estimated: 30 mL/min — ABNORMAL LOW (ref >60)
Glucose, Bld: 151 mg/dL — ABNORMAL HIGH (ref 70–99)
Potassium: 3.7 mmol/L (ref 3.5–5.1)
Sodium: 134 mmol/L — ABNORMAL LOW (ref 135–145)

## 2020-06-16 LAB — RESP PANEL BY RT-PCR (FLU A&B, COVID) ARPGX2
Influenza A by PCR: NEGATIVE
Influenza B by PCR: NEGATIVE
SARS Coronavirus 2 by RT PCR: NEGATIVE

## 2020-06-16 LAB — TROPONIN I (HIGH SENSITIVITY)
Troponin I (High Sensitivity): 10 ng/L (ref <18)
Troponin I (High Sensitivity): 9 ng/L (ref <18)

## 2020-06-16 LAB — CBG MONITORING, ED: Glucose-Capillary: 162 mg/dL — ABNORMAL HIGH (ref 70–99)

## 2020-06-16 NOTE — ED Triage Notes (Signed)
Pt c/o fever, cough and shortness of breath x 1 day. States she had 2 covid exposures.

## 2020-06-16 NOTE — ED Provider Notes (Signed)
Emergency Medicine Provider Triage Evaluation Note  Tara Jackson , a 80 y.o. female  was evaluated in triage.  Pt complains of cough, fever, shortness of breath, neck pain.  Recent exposure to COVID x 2. States she has had 5 COVID vaccines.   Review of Systems  Positive: Cough fever SOB neck pain Negative: Vomiting diarrhea  Physical Exam  BP 124/68 (BP Location: Right Arm)   Pulse 66   Temp 98.2 F (36.8 C) (Oral)   Resp 16   SpO2 95%  Gen:   Awake, no distress   Resp:  Normal effort  MSK:   Moves extremities without difficulty  Pulm:  No increased WOB. Mild rhonchi/wheezing lower lobes.     Medical Decision Making  Medically screening exam initiated at 8:08 PM.  Appropriate orders placed.  Tara Jackson was informed that the remainder of the evaluation will be completed by another provider, this initial triage assessment does not replace that evaluation, and the importance of remaining in the ED until their evaluation is complete.   Kinnie Feil, PA-C 06/16/20 2009    Drenda Freeze, MD 06/16/20 [phone]

## 2020-06-17 LAB — CBG MONITORING, ED: Glucose-Capillary: 122 mg/dL — ABNORMAL HIGH (ref 70–99)

## 2020-06-17 NOTE — ED Provider Notes (Signed)
[facility] EMERGENCY DEPARTMENT Provider Note   CSN: [id number] Arrival date & time: 06/16/20  1943     History Chief Complaint  Patient presents with  . Cough    Tara Jackson is a 80 y.o. female.  The history is provided by the patient.  Cough Severity:  Moderate Onset quality:  Gradual Duration:  1 day Timing:  Intermittent Progression:  Worsening Chronicity:  New Smoker: no   Relieved by:  Nothing Worsened by:  Nothing Associated symptoms: no chest pain   Patient with extensive history including CAD, pacemaker in place, obesity, diabetes presents with cough. Patient reports over the past day she had increasing cough.  No hemoptysis.  No active chest pain.  She does report mild shortness of breath.  She had mentioned fevers as well.  No sore throat.  She reports having COVID exposures.    Past Medical History:  Diagnosis Date  . Anemia   . Anxiety   . Arthritis   . Asthma   . Brittle bone disease   . CKD (chronic kidney disease)   . Complication of anesthesia    hard to wake up after anesthesia, trouble turning head  . Coronary artery disease, non-occlusive 2014   a. minimal CAD by cath in 2014 with 40% RI stenosis. b. low-risk NST in 11/2014.  Marland Kitchen Depression   . Diabetes mellitus type 2, insulin dependent ([place])   . Diabetic neuropathy ([place])   . Diabetic retinopathy ([place])    NPDR OU  . Diastolic dysfunction, left ventricle 11/30/14   EF 55%, grade 1 DD  . DVT (deep venous thrombosis) ([place])    "18 in RLE; 7 LLE prior to PE" (03/24/2015)  . Family history of adverse reaction to anesthesia    " the whole family is hard to wake up"  . Glaucoma   . H/O hiatal hernia   . Hyperlipidemia   . Hypertension associated with diabetes ([place])   . Hypertensive retinopathy    OU  . Hypothyroidism   . Kidney stones    "passed them"  . Macular degeneration    Exu OU  . Memory loss 06/03/2014  . On home oxygen therapy    "2L oxygen concentrator @  night"  . Osteoporosis   . Oxygen desaturation during sleep    wears 2 liters of oxygen at night   . Palpitations 35years ago   Cardionet monitor - revealed mostly normal sinus rhythm, sinus bradycardia with first-degree A-V block heart rates mostly in the 50s and 60s with some 70s. No arrhythmias, PVCs or PACs noted.  . PE (pulmonary thromboembolism) ([place]) x 3, last one was ~ 2003   history of recurrent RLE DVT with PE - last PE ~>13 yrs; Maintained on Plavix  . Pneumonia   . Presence of permanent cardiac pacemaker   . Restless legs syndrome   . Sleep apnea    cpap disontinued; test done 07/14/2008 ordered per  [place]  . Spinal headache   . Spinal stenosis    L 3, L 4 and L 5, and C 1, C 2 and C3  . Stroke [facility]) 04-19-2013   tia and stroke. Weakness Rt hand  . 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 s/p [employer] PPM in 03/2015 by Dr. Lovena Le secondary to sinus node dysfunction.   Marland Kitchen TIA (transient ischemic attack) March 2014    Patient Active Problem List   Diagnosis Date Noted  . Anxiety and depression 04/10/2020  . Hyperlipidemia  04/10/2020  . Lower extremity weakness 04/09/2020  . Dry mouth 08/12/2019  . Chronic respiratory failure with hypoxia ([place]) 07/26/2018  . Cough variant asthma vs uacs  07/25/2018  . Syncope due to orthostatic hypotension 09/12/2017  . Orthostatic dizziness 06/12/2017  . Diabetic neuropathy ([place]) 12/13/2016  . 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 Acute renal failure superimposed on chronic kidney disease ([place]) 10/24/2016  . Neurocardiogenic syncope 10/23/2016  . Chronic anemia 10/11/2016  . Pacemaker 04/26/2015  . Chronic daily headache 04/07/2015  . Symptomatic bradycardia 03/24/2015  . Sinus node dysfunction ([place]) 03/24/2015  . Chronic diastolic heart failure ([place])   . Diabetes mellitus type 2 in obese ([place])   . Normal coronary arteries 01/14/2015  . Normal cardiac stress test 11/30/2014  . Bradycardia 11/28/2014  . Type 2  diabetes mellitus with renal manifestations ([place]) 11/28/2014  . Morbid obesity ([place]) 11/28/2014  . Chronic kidney disease, stage 3b ([place]) 11/28/2014  . Memory loss 06/03/2014  . AKI (acute kidney injury) ([place])   . Chest pain 04/19/2014  . Pleuritic chest pain 04/19/2014  . Diabetes mellitus ([place]) 04/19/2014  . Edema of both legs 04/19/2014  . Dyspnea   . DOE (dyspnea on exertion)   . Tremor of right hand 09/20/2013  . Headache(784.0) 09/19/2013  . Restless legs syndrome (RLS) 09/19/2013  . Constipation due to pain medication 08/21/2013  . Ankle edema 08/21/2013  . PVC's (premature ventricular contractions) 02/10/2013  . Diastolic dysfunction, left ventricle   . 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 PULMONARY EMBOLISM, HX OF 10/02/2009  . Rhinitis 04/07/2009  . Sleep apnea 07/09/2008  . Hyperlipidemia with target LDL less than 130 05/20/2008  . Essential hypertension 05/20/2008    Past Surgical History:  Procedure Laterality Date  . ABDOMINAL HYSTERECTOMY    . APPENDECTOMY    . BACK SURGERY     steroid inj  . CATARACT EXTRACTION Bilateral   . CATARACT EXTRACTION W/ INTRAOCULAR LENS  IMPLANT, BILATERAL Bilateral 2000s  . CHOLECYSTECTOMY    . EP IMPLANTABLE DEVICE N/A 03/24/2015   [facility] Scientific PPM, Dr. Lovena Le  . ESOPHAGOGASTRODUODENOSCOPY  08/09/2011   Procedure: ESOPHAGOGASTRODUODENOSCOPY (EGD);  Surgeon: Jeryl Columbia, MD;  Location: [facility];  Service: Endoscopy;  Laterality: N/A;  . ESOPHAGOGASTRODUODENOSCOPY (EGD) WITH PROPOFOL N/A 11/12/2013   Procedure: ESOPHAGOGASTRODUODENOSCOPY (EGD) WITH PROPOFOL;  Surgeon: Jeryl Columbia, MD;  Location: [facility] ENDOSCOPY;  Service: Endoscopy;  Laterality: N/A;  . ESOPHAGOGASTRODUODENOSCOPY (EGD) WITH PROPOFOL N/A 05/05/2016   Procedure: ESOPHAGOGASTRODUODENOSCOPY (EGD) WITH PROPOFOL;  Surgeon: Clarene Essex, MD;  Location: [facility] ENDOSCOPY;  Service: Endoscopy;  Laterality: N/A;  . EYE SURGERY Bilateral    Cat Sx OU  . GLAUCOMA SURGERY Bilateral  2000s   "laser"  . HOT HEMOSTASIS  08/09/2011   Procedure: HOT HEMOSTASIS (ARGON PLASMA COAGULATION/BICAP);  Surgeon: Jeryl Columbia, MD;  Location: [facility];  Service: Endoscopy;  Laterality: N/A;  . HOT HEMOSTASIS N/A 11/12/2013   Procedure: HOT HEMOSTASIS (ARGON PLASMA COAGULATION/BICAP);  Surgeon: Jeryl Columbia, MD;  Location: [facility];  Service: Endoscopy;  Laterality: N/A;  . INSERT / REPLACE / REMOVE PACEMAKER  03/24/2015  . IRIDOTOMY / IRIDECTOMY Bilateral   . JOINT REPLACEMENT    . KNEE SURGERY Left done with 2nd left knee replacement   spacer bar placement  . LEFT HEART CATHETERIZATION WITH CORONARY ANGIOGRAM N/A 02/14/2012   Procedure: LEFT HEART CATHETERIZATION WITH CORONARY ANGIOGRAM;  Surgeon: Leonie Man, MD;  Location: [facility] CATH LAB;  Service: Cardiovascular:: no  evidence of obstructive coronary disease ,low EDP with normal EF  . LEV DOPPLER  05/29/2012   RIGHT EXTREM. NORMAL VENOUS DUPLEX  . NM MYOCAR PERF WALL MOTION  08/2019   EF 55 to 65%.  No ischemia or infarction.  LOW RISK:  . PACEMAKER PLACEMENT    . REVISION TOTAL KNEE ARTHROPLASTY Left   . TONSILLECTOMY    . TOTAL KNEE ARTHROPLASTY Bilateral   . TRANSTHORACIC ECHOCARDIOGRAM  11/2017   11/2017: EF 60-65%. Gr1 DD. PAP ~33 mmHg.  Mild TR. PPM leads in RA & RV.   Marland Kitchen TRIGGER FINGER RELEASE  11/22/2011   Procedure: RELEASE TRIGGER FINGER/A-1 PULLEY;  Surgeon: Meredith Pel, MD;  Location: [facility];  Service: Orthopedics;  Laterality: Left;  Left trigger thumb release  . TRIGGER FINGER RELEASE Right yrs ago     OB History   No obstetric history on file.     Family History  Problem Relation Age of Onset  . Cancer Mother   . Diabetes Mother   . Cancer - Prostate Father   . Diabetes Father   . Retinal detachment Son   . Diabetes Sister   . Diabetes Brother   . Diabetes Paternal Grandmother     Social History   Tobacco Use  . Smoking status: Passive Smoke Exposure - Never Smoker  . Smokeless tobacco:  Never Used  Vaping Use  . Vaping Use: Never used  Substance Use Topics  . Alcohol use: No    Alcohol/week: 0.0 standard drinks  . Drug use: No    Home Medications Prior to Admission medications   Medication Sig Start Date End Date Taking? Authorizing Provider  albuterol (VENTOLIN HFA) 108 (90 Base) MCG/ACT inhaler INHALE 2 INHALATIONS 3 TIMES A DAY AS NEEDED FOR COUGH/WHEEZE 12/12/19   Tanda Rockers, MD  budesonide-formoterol (SYMBICORT) 80-4.5 MCG/ACT inhaler Inhale 2 puffs into the lungs 2 (two) times daily.    [provider]  busPIRone (BUSPAR) 10 MG tablet Take 1 tablet (10 mg total) by mouth every morning. 06/03/14   Garvin Fila, MD  Cholecalciferol (VITAMIN D-3 PO) Take 2 tablets by mouth every morning.     [provider]  clonazePAM (KLONOPIN) 0.5 MG tablet Take 0.5-1 mg by mouth See admin instructions. Takes 1mg  in am and 0.5mg  in pm.    [provider]  clopidogrel (PLAVIX) 75 MG tablet Take 75 mg by mouth every morning.    [provider]  dicyclomine (BENTYL) 20 MG tablet Take 20-40 mg by mouth in the morning and at bedtime. Take 2 tablets (40 mg) in the morning and Take 1 tablet (20 mg) at bedtime    [provider]  donepezil (ARICEPT) 10 MG tablet Take 1 tablet (10 mg total) by mouth at bedtime. 12/26/19   Frann Rider, NP  furosemide (LASIX) 20 MG tablet Take 1 tablet (20 mg total) by mouth daily as needed for fluid or edema. As directed Patient taking differently: Take 20 mg by mouth daily. As directed 01/21/20   Leonie Man, MD  glipiZIDE (GLUCOTROL) 10 MG tablet Take 20 mg by mouth 2 (two) times daily before a meal.  05/03/14   [provider]  guaiFENesin (MUCINEX) 600 MG 12 hr tablet Take 600 mg by mouth 2 (two) times daily as needed for cough.    [provider]  insulin NPH (HUMULIN N,NOVOLIN N) 100 UNIT/ML injection Inject 0-20 Units into the skin See admin instructions. Novolin-N Give 20 unit  in morning; give  5 units at 4pm; evening dose as needed.  If less than 150 blood sugar= 0units.  If greater than 200 blood sugar= give 10units to decrease by 100 blood sugar    [provider]  insulin regular (NOVOLIN R,HUMULIN R) 100 units/mL injection Inject 0-30 Units into the skin See admin instructions. Give 25 unit in morning; give 20units at 4pm; evening dose as needed.  If less than 150 blood sugar= 0units.  If greater than 200 blood sugar= give 10units to decrease by 100 blood sugar    [provider]  levothyroxine (SYNTHROID) 100 MCG tablet Take 100 mcg by mouth daily before breakfast.    [provider]  lidocaine (LIDODERM) 5 % Place 3 patches onto the skin daily as needed (pain). Remove & Discard patch within 12 hours or as directed by MD    [provider]  linaclotide (LINZESS) 290 MCG CAPS capsule Take 290 mcg by mouth daily before breakfast.    [provider]  Menthol-Methyl Salicylate (MUSCLE RUB) 10-15 % CREA Apply 1 application topically as needed for muscle pain.    [provider]  nortriptyline (PAMELOR) 50 MG capsule TAKE 2 CAPSULES (100MG ) AT BEDTIME Patient taking differently: Take 50 mg by mouth 2 (two) times daily. 06/02/16   Garvin Fila, MD  OXYGEN Inhale 2 L into the lungs as needed. Used at overnight with a Cytogeneticist, Historical, MD  oxymetazoline (AFRIN NASAL SPRAY) 0.05 % nasal spray Place 1 spray into both nostrils 2 (two) times daily as needed for congestion. 03/10/20   Martyn Ehrich, NP  pantoprazole (PROTONIX) 40 MG tablet Take 1 tablet (40 mg total) by mouth daily. 06/19/19   Kroeger, Lorelee Cover., PA-C  polyethylene glycol (MIRALAX / GLYCOLAX) packet Take 17 g by mouth daily as needed for mild constipation.     [provider]  potassium chloride SA (K-DUR,KLOR-CON) 20 MEQ tablet Take 1 tablet (20 mEq total) by mouth 2 (two) times daily. 06/22/15   Davonna Belling, MD  Probiotic  Product (ALIGN) 4 MG CAPS Take 1 capsule by mouth daily.    [provider]  rosuvastatin (CRESTOR) 20 MG tablet Take 20 mg by mouth daily.  03/13/17   [provider]  solifenacin (VESICARE) 5 MG tablet Take 5 mg by mouth daily. 02/24/19   [provider]  terconazole (TERAZOL 7) 0.4 % vaginal cream Place 1 applicator vaginally daily as needed for itching. 02/12/19   [provider]  vitamin B-12 (CYANOCOBALAMIN) 1000 MCG tablet Take 1 tablet (1,000 mcg total) by mouth daily. 04/11/20   Dwyane Dee, MD    Allergies    Iodine, Iohexol, Metoclopramide hcl, Sulfa antibiotics, Lactose intolerance (gi), and Lansoprazole  Review of Systems   Review of Systems  Constitutional: Positive for fatigue.  Respiratory: Positive for cough.   Cardiovascular: Negative for chest pain.  All other systems reviewed and are negative.   Physical Exam Updated Vital Signs BP 134/62 (BP Location: Right Arm)   Pulse 67   Temp 98.2 F (36.8 C) (Oral)   Resp 18   SpO2 96%   Physical Exam CONSTITUTIONAL: Elderly, no acute distress HEAD: Normocephalic/atraumatic EYES: EOMI ENMT:  right-sided cervical adenopathy NECK: supple no meningeal signs SPINE/BACK:entire spine nontender CV: S1/S2 noted, no murmurs/rubs/gallops noted LUNGS: Lungs are clear to auscultation bilaterally, no apparent distress ABDOMEN: soft, nontender, obese GU:no cva tenderness NEURO: Pt is awake/alert/appropriate, moves all extremitiesx4.  No facial  droop.   EXTREMITIES: pulses normal/equal, full ROM SKIN: warm, color normal PSYCH: no abnormalities of mood noted, alert and oriented to situation  ED Results / Procedures / Treatments   Labs (all labs ordered are listed, but only abnormal results are displayed) Labs Reviewed  CBC - Abnormal; Notable for the following components:      Result Value   WBC 11.4 (*)    MCV 102.1 (*)    All other components within normal limits  BASIC METABOLIC PANEL -  Abnormal; Notable for the following components:   Sodium 134 (*)    Glucose, Bld 151 (*)    Creatinine, Ser 1.72 (*)    Calcium 8.8 (*)    GFR, Estimated 30 (*)    All other components within normal limits  CBG MONITORING, ED - Abnormal; Notable for the following components:   Glucose-Capillary 162 (*)    All other components within normal limits  RESP PANEL BY RT-PCR (FLU A&B, COVID) ARPGX2  TROPONIN I (HIGH SENSITIVITY)  TROPONIN I (HIGH SENSITIVITY)    EKG EKG Interpretation  Date/Time:  Wednesday Jun 17 2020 00:06:00 EDT Ventricular Rate:  64 PR Interval:  249 QRS Duration: 112 QT Interval:  522 QTC Calculation: 539 R Axis:   143 Text Interpretation: Sinus or ectopic atrial rhythm Prolonged PR interval Incomplete right bundle branch block Low voltage, precordial leads Confirmed by Ripley Fraise [phone]) on 06/17/2020 12:16:38 AM   Radiology DG Chest 2 View  Result Date: 06/16/2020 CLINICAL DATA:  Cough and shortness of breath EXAM: CHEST - 2 VIEW COMPARISON:  07/19/2019 FINDINGS: Cardiac shadow is enlarged but stable. Pacing device is again seen. Lungs are clear. No bony abnormality is noted. IMPRESSION: No active cardiopulmonary disease. Electronically Signed   By: Inez Catalina M.D.   On: 06/16/2020 20:47    Procedures Procedures   Medications Ordered in ED Medications - No data to display  ED Course  I have reviewed the triage vital signs and the nursing notes.  Pertinent labs & imaging results that were available during my care of the patient were reviewed by me and considered in my medical decision making (see chart for details).    MDM Rules/Calculators/A&P                          Patient with multiple medical conditions presents with cough.  Patient reports recent COVID exposure was concerned that she had not.  COVID testing is negative.  X-ray was reviewed and is negative.  Lung sounds are clear on my exam.  No hypoxia. No chest pains reported, no acute  EKG changes Mild renal insufficiency noted but this is been seen previously. Patient is requesting discharge home.  At this point patient is appropriate for outpatient management.  Suspect either allergies versus viral trigger of her cough Final Clinical Impression(s) / ED Diagnoses Final diagnoses:  Cough    Rx / DC Orders ED Discharge Orders    None       Ripley Fraise, MD 06/17/20 0025

## 2020-06-18 ENCOUNTER — Ambulatory Visit (INDEPENDENT_AMBULATORY_CARE_PROVIDER_SITE_OTHER): Payer: Medicare HMO

## 2020-06-18 DIAGNOSIS — I63139 Cerebral infarction due to embolism of unspecified carotid artery: Secondary | ICD-10-CM | POA: Diagnosis not present

## 2020-06-18 LAB — CUP PACEART REMOTE DEVICE CHECK
Battery Remaining Longevity: 102 mo
Battery Remaining Percentage: 100 %
Brady Statistic RA Percent Paced: 84 %
Brady Statistic RV Percent Paced: 69 %
Date Time Interrogation Session: 20220512031500
Implantable Lead Implant Date: 20170214
Implantable Lead Implant Date: 20170214
Implantable Lead Location: 753859
Implantable Lead Location: 753860
Implantable Lead Model: 7740
Implantable Lead Model: 7741
Implantable Lead Serial Number: 649859
Implantable Lead Serial Number: 728741
Implantable Pulse Generator Implant Date: 20170214
Lead Channel Impedance Value: 677 Ohm
Lead Channel Impedance Value: 762 Ohm
Lead Channel Pacing Threshold Amplitude: 0.5 V
Lead Channel Pacing Threshold Amplitude: 1.3 V
Lead Channel Pacing Threshold Pulse Width: 0.4 ms
Lead Channel Pacing Threshold Pulse Width: 0.4 ms
Lead Channel Setting Pacing Amplitude: 1.9 V
Lead Channel Setting Pacing Amplitude: 2 V
Lead Channel Setting Pacing Pulse Width: 0.4 ms
Lead Channel Setting Sensing Sensitivity: 2.5 mV
Pulse Gen Serial Number: 718098

## 2020-07-10 NOTE — Progress Notes (Signed)
Remote pacemaker transmission.   

## 2020-07-16 ENCOUNTER — Telehealth: Payer: Self-pay

## 2020-07-16 NOTE — Telephone Encounter (Signed)
Spoke with patient and scheduled an in-person Palliative Consult for 08/07/20 @ 9AM.   COVID screening was negative. No pets in home. Patient lives with husband.  Consent obtained; updated Outlook/Netsmart/Team List and Epic.   Patient is aware she may be receiving a call from NP the day before or day of to confirm appointment.

## 2020-07-16 NOTE — Telephone Encounter (Signed)
Attempted to contact patient's husband [name] to schedule a Palliative Care consult appointment. No answer left a message to return call.

## 2020-07-21 ENCOUNTER — Emergency Department (HOSPITAL_COMMUNITY): Payer: Medicare HMO

## 2020-07-21 ENCOUNTER — Encounter (HOSPITAL_COMMUNITY): Payer: Self-pay

## 2020-07-21 ENCOUNTER — Emergency Department (HOSPITAL_COMMUNITY)
Admission: EM | Admit: 2020-07-21 | Discharge: 2020-07-24 | Disposition: A | Discharge status: Home / Self Care | Payer: Medicare HMO | Attending: Emergency Medicine | Admitting: Emergency Medicine

## 2020-07-21 DIAGNOSIS — I130 Hypertensive heart and chronic kidney disease with heart failure and stage 1 through stage 4 chronic kidney disease, or unspecified chronic kidney disease: Secondary | ICD-10-CM | POA: Insufficient documentation

## 2020-07-21 DIAGNOSIS — W010XXA Fall on same level from slipping, tripping and stumbling without subsequent striking against object, initial encounter: Secondary | ICD-10-CM | POA: Diagnosis not present

## 2020-07-21 DIAGNOSIS — R5383 Other fatigue: Secondary | ICD-10-CM | POA: Insufficient documentation

## 2020-07-21 DIAGNOSIS — J45909 Unspecified asthma, uncomplicated: Secondary | ICD-10-CM | POA: Insufficient documentation

## 2020-07-21 DIAGNOSIS — Z955 Presence of coronary angioplasty implant and graft: Secondary | ICD-10-CM | POA: Diagnosis not present

## 2020-07-21 DIAGNOSIS — Z96653 Presence of artificial knee joint, bilateral: Secondary | ICD-10-CM | POA: Insufficient documentation

## 2020-07-21 DIAGNOSIS — Z7984 Long term (current) use of oral hypoglycemic drugs: Secondary | ICD-10-CM | POA: Diagnosis not present

## 2020-07-21 DIAGNOSIS — S82892A Other fracture of left lower leg, initial encounter for closed fracture: Secondary | ICD-10-CM

## 2020-07-21 DIAGNOSIS — E11319 Type 2 diabetes mellitus with unspecified diabetic retinopathy without macular edema: Secondary | ICD-10-CM | POA: Insufficient documentation

## 2020-07-21 DIAGNOSIS — Z7951 Long term (current) use of inhaled steroids: Secondary | ICD-10-CM | POA: Insufficient documentation

## 2020-07-21 DIAGNOSIS — Z950 Presence of cardiac pacemaker: Secondary | ICD-10-CM | POA: Diagnosis not present

## 2020-07-21 DIAGNOSIS — Y92009 Unspecified place in unspecified non-institutional (private) residence as the place of occurrence of the external cause: Secondary | ICD-10-CM | POA: Insufficient documentation

## 2020-07-21 DIAGNOSIS — E039 Hypothyroidism, unspecified: Secondary | ICD-10-CM | POA: Insufficient documentation

## 2020-07-21 DIAGNOSIS — I2510 Atherosclerotic heart disease of native coronary artery without angina pectoris: Secondary | ICD-10-CM | POA: Diagnosis not present

## 2020-07-21 DIAGNOSIS — S8262XA Displaced fracture of lateral malleolus of left fibula, initial encounter for closed fracture: Secondary | ICD-10-CM | POA: Insufficient documentation

## 2020-07-21 DIAGNOSIS — N1832 Chronic kidney disease, stage 3b: Secondary | ICD-10-CM | POA: Insufficient documentation

## 2020-07-21 DIAGNOSIS — E1129 Type 2 diabetes mellitus with other diabetic kidney complication: Secondary | ICD-10-CM | POA: Insufficient documentation

## 2020-07-21 DIAGNOSIS — Z794 Long term (current) use of insulin: Secondary | ICD-10-CM | POA: Insufficient documentation

## 2020-07-21 DIAGNOSIS — Z20822 Contact with and (suspected) exposure to covid-19: Secondary | ICD-10-CM | POA: Insufficient documentation

## 2020-07-21 DIAGNOSIS — Z7722 Contact with and (suspected) exposure to environmental tobacco smoke (acute) (chronic): Secondary | ICD-10-CM | POA: Diagnosis not present

## 2020-07-21 DIAGNOSIS — E1140 Type 2 diabetes mellitus with diabetic neuropathy, unspecified: Secondary | ICD-10-CM | POA: Diagnosis not present

## 2020-07-21 DIAGNOSIS — I5032 Chronic diastolic (congestive) heart failure: Secondary | ICD-10-CM | POA: Diagnosis not present

## 2020-07-21 DIAGNOSIS — Z79899 Other long term (current) drug therapy: Secondary | ICD-10-CM | POA: Insufficient documentation

## 2020-07-21 DIAGNOSIS — R262 Difficulty in walking, not elsewhere classified: Secondary | ICD-10-CM

## 2020-07-21 DIAGNOSIS — M79605 Pain in left leg: Secondary | ICD-10-CM

## 2020-07-21 DIAGNOSIS — S99912A Unspecified injury of left ankle, initial encounter: Secondary | ICD-10-CM | POA: Diagnosis present

## 2020-07-21 DIAGNOSIS — W19XXXA Unspecified fall, initial encounter: Secondary | ICD-10-CM

## 2020-07-21 LAB — CBC
HCT: 37 % (ref 36.0–46.0)
Hemoglobin: 11.6 g/dL — ABNORMAL LOW (ref 12.0–15.0)
MCH: 31.3 pg (ref 26.0–34.0)
MCHC: 31.4 g/dL (ref 30.0–36.0)
MCV: 99.7 fL (ref 80.0–100.0)
Platelets: 186 10*3/uL (ref 150–400)
RBC: 3.71 MIL/uL — ABNORMAL LOW (ref 3.87–5.11)
RDW: 12.3 % (ref 11.5–15.5)
WBC: 5.9 10*3/uL (ref 4.0–10.5)
nRBC: 0 % (ref 0.0–0.2)

## 2020-07-21 LAB — BASIC METABOLIC PANEL
Anion gap: 10 (ref 5–15)
BUN: 20 mg/dL (ref 8–23)
CO2: 26 mmol/L (ref 22–32)
Calcium: 8.9 mg/dL (ref 8.9–10.3)
Chloride: 99 mmol/L (ref 98–111)
Creatinine, Ser: 1.52 mg/dL — ABNORMAL HIGH (ref 0.44–1.00)
GFR, Estimated: 34 mL/min — ABNORMAL LOW (ref >60)
Glucose, Bld: 367 mg/dL — ABNORMAL HIGH (ref 70–99)
Potassium: 4.7 mmol/L (ref 3.5–5.1)
Sodium: 135 mmol/L (ref 135–145)

## 2020-07-21 LAB — CBG MONITORING, ED: Glucose-Capillary: 200 mg/dL — ABNORMAL HIGH (ref 70–99)

## 2020-07-21 MED ORDER — BUSPIRONE HCL 10 MG PO TABS
10.0000 mg | ORAL_TABLET | Freq: Every morning | ORAL | Status: DC
  Administered 2020-07-22 – 2020-07-24 (×3): 10 mg via ORAL
  Filled 2020-07-21 (×4): qty 1

## 2020-07-21 MED ORDER — TRAMADOL HCL 50 MG PO TABS: 50.0000 mg | ORAL_TABLET | Freq: Two times a day (BID) | ORAL | 0 refills | Status: DC | PRN

## 2020-07-21 MED ORDER — ROSUVASTATIN CALCIUM 20 MG PO TABS
20.0000 mg | ORAL_TABLET | Freq: Every day | ORAL | Status: DC
  Administered 2020-07-22 – 2020-07-24 (×3): 20 mg via ORAL
  Filled 2020-07-21 (×3): qty 1

## 2020-07-21 MED ORDER — DONEPEZIL HCL 5 MG PO TABS
10.0000 mg | ORAL_TABLET | Freq: Every day | ORAL | Status: DC
  Administered 2020-07-21 – 2020-07-23 (×3): 10 mg via ORAL
  Filled 2020-07-21 (×3): qty 2

## 2020-07-21 MED ORDER — LEVOTHYROXINE SODIUM 100 MCG PO TABS: 100.0000 ug | ORAL_TABLET | Freq: Every day | ORAL | Status: DC

## 2020-07-21 MED ORDER — CLONAZEPAM 0.5 MG PO TABS: 0.5000 mg | ORAL_TABLET | ORAL | Status: DC

## 2020-07-21 MED ORDER — ACETAMINOPHEN 325 MG PO TABS: 650.0000 mg | ORAL_TABLET | Freq: Four times a day (QID) | ORAL | 0 refills | Status: AC | PRN

## 2020-07-21 MED ORDER — ACETAMINOPHEN 325 MG PO TABS
650.0000 mg | ORAL_TABLET | Freq: Four times a day (QID) | ORAL | Status: DC | PRN
  Administered 2020-07-22 – 2020-07-24 (×6): 650 mg via ORAL
  Filled 2020-07-21 (×6): qty 2

## 2020-07-21 MED ORDER — CLOPIDOGREL BISULFATE 75 MG PO TABS
75.0000 mg | ORAL_TABLET | Freq: Every morning | ORAL | Status: DC
  Administered 2020-07-22 – 2020-07-24 (×3): 75 mg via ORAL
  Filled 2020-07-21 (×3): qty 1

## 2020-07-21 MED ORDER — CLONAZEPAM 0.5 MG PO TABS
0.5000 mg | ORAL_TABLET | Freq: Every day | ORAL | Status: DC
  Administered 2020-07-21 – 2020-07-23 (×3): 0.5 mg via ORAL
  Filled 2020-07-21 (×3): qty 1

## 2020-07-21 MED ORDER — MOMETASONE FURO-FORMOTEROL FUM 100-5 MCG/ACT IN AERO
2.0000 | INHALATION_SPRAY | Freq: Two times a day (BID) | RESPIRATORY_TRACT | Status: DC
  Administered 2020-07-22 (×2): 2 via RESPIRATORY_TRACT
  Filled 2020-07-21: qty 8.8

## 2020-07-21 MED ORDER — CLONAZEPAM 0.5 MG PO TABS
1.0000 mg | ORAL_TABLET | Freq: Every day | ORAL | Status: DC
  Administered 2020-07-22 – 2020-07-24 (×3): 1 mg via ORAL
  Filled 2020-07-21 (×3): qty 2

## 2020-07-21 MED ORDER — ALBUTEROL SULFATE HFA 108 (90 BASE) MCG/ACT IN AERS: 2.0000 | INHALATION_SPRAY | Freq: Four times a day (QID) | RESPIRATORY_TRACT | Status: DC | PRN

## 2020-07-21 MED ORDER — ACETAMINOPHEN 325 MG PO TABS
650.0000 mg | ORAL_TABLET | Freq: Once | ORAL | Status: AC
  Administered 2020-07-21: 650 mg via ORAL
  Filled 2020-07-21: qty 2

## 2020-07-21 MED ORDER — LEVOTHYROXINE SODIUM 100 MCG PO TABS
100.0000 ug | ORAL_TABLET | Freq: Every day | ORAL | Status: DC
  Administered 2020-07-22 – 2020-07-24 (×3): 100 ug via ORAL
  Filled 2020-07-21 (×3): qty 1

## 2020-07-21 MED ORDER — TRAMADOL HCL 50 MG PO TABS
50.0000 mg | ORAL_TABLET | Freq: Once | ORAL | Status: AC
  Administered 2020-07-21: 50 mg via ORAL
  Filled 2020-07-21: qty 1

## 2020-07-21 MED ORDER — INSULIN ASPART 100 UNIT/ML IJ SOLN
10.0000 [IU] | Freq: Once | INTRAMUSCULAR | Status: AC
  Administered 2020-07-22: 10 [IU] via SUBCUTANEOUS
  Filled 2020-07-21: qty 0.1

## 2020-07-21 MED ORDER — NORTRIPTYLINE HCL 25 MG PO CAPS
50.0000 mg | ORAL_CAPSULE | Freq: Two times a day (BID) | ORAL | Status: DC
  Administered 2020-07-21 – 2020-07-24 (×6): 50 mg via ORAL
  Filled 2020-07-21 (×7): qty 2

## 2020-07-21 NOTE — ED Triage Notes (Signed)
Pt BIB [facility] from home. Pt has carpeted steps for her bed and she tripped over them. Pt is c/o left knee and and lower leg pain and right shin pain. Pt denies hitting her head, LOC, neck pain or back pain. Pt is on blood thinners. Pt is A&Ox4.

## 2020-07-21 NOTE — Progress Notes (Signed)
TOC CM referral will work for SNF, pt lives with husband. Will need [employer] Medicare auth for SNF. Waiting PT evaluation. [place], [place] ED TOC CM [phone]

## 2020-07-21 NOTE — ED Provider Notes (Signed)
[facility] DEPT Provider Note   CSN: [id number] Arrival date & time: 07/21/20  1542     History Chief Complaint  Patient presents with   Tara Jackson is a 80 y.o. female on Plavix, with a history of diabetes, bilateral knee replacements, presenting to the ED with a fall.  She reports that she slipped in her room today on a carpet and landed on her left knee.  She feels like she hyperflexed her left knee.  She felt a pop and had pain bearing weight afterwards.  She adamantly denies striking her head, injuring any other part of body, or loss of consciousness.  She is here with her husband.  She reports some general fatigue for the past several days, and wonders whether she may be mildly dehydrated.  She does try to drink water but also drinks diet soda regularly.  HPI     Past Medical History:  Diagnosis Date   Anemia    Anxiety    Arthritis    Asthma    Brittle bone disease    CKD (chronic kidney disease)    Complication of anesthesia    hard to wake up after anesthesia, trouble turning head   Coronary artery disease, non-occlusive 2014   a. minimal CAD by cath in 2014 with 40% RI stenosis. b. low-risk NST in 11/2014.   Depression    Diabetes mellitus type 2, insulin dependent (HCC)    Diabetic neuropathy (HCC)    Diabetic retinopathy ([place])    NPDR OU   Diastolic dysfunction, left ventricle 11/30/14   EF 55%, grade 1 DD   DVT (deep venous thrombosis) ([place])    "18 in RLE; 7 LLE prior to PE" (03/24/2015)   Family history of adverse reaction to anesthesia    " the whole family is hard to wake up"   Glaucoma    H/O hiatal hernia    Hyperlipidemia    Hypertension associated with diabetes ([place])    Hypertensive retinopathy    OU   Hypothyroidism    Kidney stones    "passed them"   Macular degeneration    Exu OU   Memory loss 06/03/2014   On home oxygen therapy    "2L oxygen concentrator @ night"   Osteoporosis    Oxygen  desaturation during sleep    wears 2 liters of oxygen at night    Palpitations 35years ago   Cardionet monitor - revealed mostly normal sinus rhythm, sinus bradycardia with first-degree A-V block heart rates mostly in the 50s and 60s with some 70s. No arrhythmias, PVCs or PACs noted.   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 s/p [employer] PPM in 03/2015 by Dr. Lovena Le secondary to sinus node dysfunction.    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 Lovena Le   ESOPHAGOGASTRODUODENOSCOPY  08/09/2011   Procedure: ESOPHAGOGASTRODUODENOSCOPY (EGD);  Surgeon: Jeryl Columbia, MD;  Location: [facility];  Service: Endoscopy;  Laterality: N/A;   ESOPHAGOGASTRODUODENOSCOPY (EGD) WITH PROPOFOL N/A 11/12/2013   Procedure: ESOPHAGOGASTRODUODENOSCOPY (EGD) WITH PROPOFOL;  Surgeon: Jeryl Columbia, MD;  Location: [facility] ENDOSCOPY;  Service: Endoscopy;  Laterality: N/A;   ESOPHAGOGASTRODUODENOSCOPY (EGD) WITH PROPOFOL N/A 05/05/2016   Procedure: ESOPHAGOGASTRODUODENOSCOPY (EGD) WITH PROPOFOL;  Surgeon: Clarene Essex, MD;  Location: [facility] ENDOSCOPY;  Service: Endoscopy;  Laterality: N/A;   EYE SURGERY Bilateral    Cat Sx OU   GLAUCOMA SURGERY Bilateral 2000s   "laser"   HOT HEMOSTASIS  08/09/2011   Procedure: HOT HEMOSTASIS (ARGON PLASMA COAGULATION/BICAP);   Surgeon: Jeryl Columbia, MD;  Location: [facility];  Service: Endoscopy;  Laterality: N/A;   HOT HEMOSTASIS N/A 11/12/2013   Procedure: HOT HEMOSTASIS (ARGON PLASMA COAGULATION/BICAP);  Surgeon: Jeryl Columbia, MD;  Location: [facility];  Service: Endoscopy;  Laterality: N/A;   INSERT / REPLACE / REMOVE PACEMAKER  03/24/2015   IRIDOTOMY / IRIDECTOMY Bilateral    JOINT REPLACEMENT     KNEE SURGERY Left done with 2nd left knee replacement   spacer bar placement   LEFT HEART CATHETERIZATION WITH CORONARY ANGIOGRAM N/A 02/14/2012   Procedure: LEFT HEART CATHETERIZATION WITH CORONARY ANGIOGRAM;  Surgeon: Leonie Man, MD;  Location:  [facility] CATH LAB;  Service: Cardiovascular:: no evidence of obstructive coronary disease ,low EDP with normal EF   LEV DOPPLER  05/29/2012   RIGHT EXTREM. NORMAL VENOUS DUPLEX   NM MYOCAR PERF WALL MOTION  08/2019   EF 55 to 65%.  No ischemia or infarction.  LOW RISK:   PACEMAKER PLACEMENT     REVISION TOTAL KNEE ARTHROPLASTY Left    TONSILLECTOMY     TOTAL KNEE ARTHROPLASTY Bilateral    TRANSTHORACIC ECHOCARDIOGRAM  11/2017   11/2017: EF 60-65%. Gr1 DD. PAP ~33 mmHg.  Mild TR. PPM leads in RA & RV.    TRIGGER FINGER RELEASE  11/22/2011   Procedure: RELEASE TRIGGER FINGER/A-1 PULLEY;  Surgeon: Meredith Pel, MD;  Location: [facility];  Service: Orthopedics;  Laterality: Left;  Left trigger thumb release   TRIGGER FINGER RELEASE Right yrs ago     OB History   No obstetric history on file.     Family History  Problem Relation Age of Onset   Cancer Mother    Diabetes Mother    Cancer - Prostate Father    Diabetes Father    Retinal detachment Son    Diabetes Sister    Diabetes Brother    Diabetes Paternal Grandmother     Social History   Tobacco Use   Smoking status: Passive Smoke Exposure - Never Smoker   Smokeless tobacco: Never  Vaping Use   Vaping Use: Never used  Substance Use Topics   Alcohol use: No    Alcohol/week: 0.0 standard drinks   Drug  use: No    Home Medications Prior to Admission medications   Medication Sig Start Date End Date Taking? Authorizing Provider  acetaminophen (TYLENOL) 325 MG tablet Take 2 tablets (650 mg total) by mouth every 6 (six) hours as needed for up to 30 doses for mild pain or moderate pain. 07/21/20  Yes Carlota Philley, Carola Rhine, MD  traMADol (ULTRAM) 50 MG tablet Take 1 tablet (50 mg total) by mouth every 12 (twelve) hours as needed for up to 20 doses. 07/21/20  Yes Wyvonnia Dusky, MD  albuterol (VENTOLIN HFA) 108 (90 Base) MCG/ACT inhaler INHALE 2 INHALATIONS 3 TIMES A DAY AS NEEDED FOR COUGH/WHEEZE 12/12/19   Tanda Rockers, MD  budesonide-formoterol (SYMBICORT) 80-4.5 MCG/ACT inhaler Inhale 2 puffs into the lungs 2 (two) times daily.    [provider]  busPIRone (BUSPAR) 10 MG tablet Take 1 tablet (10 mg total) by mouth every morning. 06/03/14   Garvin Fila, MD  Cholecalciferol (VITAMIN D-3 PO) Take 2 tablets by mouth every morning.     [provider]  clonazePAM (KLONOPIN) 0.5 MG tablet Take 0.5-1 mg by mouth See admin instructions. Takes 1mg  in am and 0.5mg  in pm.    [provider]  clopidogrel (PLAVIX) 75 MG tablet Take 75 mg by mouth every morning.    [provider]  dicyclomine (BENTYL) 20 MG tablet Take 20-40 mg by mouth in the morning and at bedtime. Take 2 tablets (40 mg) in the morning and Take 1 tablet (20 mg) at bedtime    [provider]  donepezil (ARICEPT) 10 MG tablet Take 1 tablet (10 mg total) by mouth at bedtime. 12/26/19   Frann Rider, NP  furosemide (LASIX) 20 MG tablet Take 1 tablet (20 mg total) by mouth daily as needed for fluid or edema. As directed Patient taking differently: Take 20 mg by mouth daily. As directed 01/21/20   Glenetta Hew  W, MD  glipiZIDE (GLUCOTROL) 10 MG tablet Take 20 mg by mouth 2 (two) times daily before a meal.  05/03/14   [provider]  guaiFENesin (MUCINEX) 600 MG 12 hr tablet Take 600 mg by  mouth 2 (two) times daily as needed for cough.    [provider]  insulin NPH (HUMULIN N,NOVOLIN N) 100 UNIT/ML injection Inject 0-20 Units into the skin See admin instructions. Novolin-N Give 20 unit in morning; give  5 units at 4pm; evening dose as needed.  If less than 150 blood sugar= 0units.  If greater than 200 blood sugar= give 10units to decrease by 100 blood sugar    [provider]  insulin regular (NOVOLIN R,HUMULIN R) 100 units/mL injection Inject 0-30 Units into the skin See admin instructions. Give 25 unit in morning; give 20units at 4pm; evening dose as needed.  If less than 150 blood sugar= 0units.  If greater than 200 blood sugar= give 10units to decrease by 100 blood sugar    [provider]  levothyroxine (SYNTHROID) 100 MCG tablet Take 100 mcg by mouth daily before breakfast.    [provider]  lidocaine (LIDODERM) 5 % Place 3 patches onto the skin daily as needed (pain). Remove & Discard patch within 12 hours or as directed by MD    [provider]  linaclotide (LINZESS) 290 MCG CAPS capsule Take 290 mcg by mouth daily before breakfast.    [provider]  Menthol-Methyl Salicylate (MUSCLE RUB) 10-15 % CREA Apply 1 application topically as needed for muscle pain.    [provider]  nortriptyline (PAMELOR) 50 MG capsule TAKE 2 CAPSULES (100MG ) AT BEDTIME Patient taking differently: Take 50 mg by mouth 2 (two) times daily. 06/02/16   Garvin Fila, MD  OXYGEN Inhale 2 L into the lungs as needed. Used at overnight with a Cytogeneticist, Historical, MD  oxymetazoline (AFRIN NASAL SPRAY) 0.05 % nasal spray Place 1 spray into both nostrils 2 (two) times daily as needed for congestion. 03/10/20   Martyn Ehrich, NP  pantoprazole (PROTONIX) 40 MG tablet Take 1 tablet (40 mg total) by mouth daily. 06/19/19   Kroeger, Lorelee Cover., PA-C  polyethylene glycol (MIRALAX / GLYCOLAX) packet Take 17 g by mouth daily as needed  for mild constipation.     [provider]  potassium chloride SA (K-DUR,KLOR-CON) 20 MEQ tablet Take 1 tablet (20 mEq total) by mouth 2 (two) times daily. 06/22/15   Davonna Belling, MD  Probiotic Product (ALIGN) 4 MG CAPS Take 1 capsule by mouth daily.    [provider]  rosuvastatin (CRESTOR) 20 MG tablet Take 20 mg by mouth daily.  03/13/17   [provider]  solifenacin (VESICARE) 5 MG tablet Take 5 mg by mouth daily. 02/24/19   [provider]  terconazole (TERAZOL 7) 0.4 % vaginal cream Place 1 applicator vaginally daily as needed for itching. 02/12/19   [provider]  vitamin B-12 (CYANOCOBALAMIN) 1000 MCG tablet Take 1 tablet (1,000 mcg total) by mouth daily. 04/11/20   Dwyane Dee, MD    Allergies    Iodine, Iohexol, Metoclopramide hcl, Sulfa antibiotics, Lactose intolerance (gi), and Lansoprazole  Review of Systems   Review of Systems  Constitutional:  Negative for chills and fever.  Respiratory:  Negative for cough and shortness of breath.   Cardiovascular:  Negative for chest pain and palpitations.  Gastrointestinal:  Negative for abdominal pain and vomiting.  Genitourinary:  Negative for dysuria and hematuria.  Musculoskeletal:  Positive for arthralgias and myalgias.  Skin:  Negative for color change and rash.  Neurological:  Negative for seizures and syncope.  All other systems reviewed and are negative.  Physical Exam Updated Vital Signs BP 132/89   Pulse 77   Temp (!) 97.5 F (36.4 C)   Resp 20   Ht 5\' 7"  (1.702 m)   Wt 108.9 kg   SpO2 100%   BMI 37.59 kg/m   Physical Exam Constitutional:      General: She is not in acute distress.    Appearance: She is obese.  HENT:     Head: Normocephalic and atraumatic.  Eyes:     Conjunctiva/sclera: Conjunctivae normal.     Pupils: Pupils are equal, round, and reactive to light.  Cardiovascular:     Rate and Rhythm: Normal rate and regular rhythm.     Pulses: Normal  pulses.  Pulmonary:     Effort: Pulmonary effort is normal. No respiratory distress.  Abdominal:     General: There is no distension.     Tenderness: There is no abdominal tenderness.  Musculoskeletal:     Comments: Medial malleolar tenderness  Skin:    General: Skin is warm and dry.  Neurological:     General: No focal deficit present.     Mental Status: She is alert and oriented to person, place, and time.  Psychiatric:        Mood and Affect: Mood normal.        Behavior: Behavior normal.    ED Results / Procedures / Treatments   Labs (all labs ordered are listed, but only abnormal results are displayed) Labs Reviewed  BASIC METABOLIC PANEL - Abnormal; Notable for the following components:      Result Value   Glucose, Bld 367 (*)    Creatinine, Ser 1.52 (*)    GFR, Estimated 34 (*)    All other components within normal limits  CBC - Abnormal; Notable for the following components:   RBC 3.71 (*)    Hemoglobin 11.6 (*)    All other components within normal limits  CBG MONITORING, ED - Abnormal; Notable for the following components:   Glucose-Capillary 200 (*)    All other components within normal limits    EKG None  Radiology DG Knee 2 Views Left  Result Date: 07/21/2020 CLINICAL DATA:  80 year old female with fall and trauma to the left lower extremity. EXAM: LEFT KNEE - 1-2 VIEW; LEFT TIBIA AND FIBULA - 2 VIEW; LEFT ANKLE COMPLETE - 3+ VIEW COMPARISON:  Left knee radiograph dated 04/09/2020. FINDINGS: A small bone fragment adjacent to the tip of the medial malleolus may be chronic or represent an acute avulsion fracture. Correlation with point tenderness recommended. No other acute fracture identified. The bones are osteopenic. There is no dislocation. There is a total left knee arthroplasty. The arthroplasty components appear intact and in anatomic alignment. The soft tissues are grossly unremarkable. IMPRESSION: Chronic changes versus possible acute avulsion fracture  of the tip of the medial malleolus. Correlation with point tenderness recommended. Electronically Signed   By: Anner Crete M.D.   On: 07/21/2020 16:37   DG Tibia/Fibula Left  Result Date: 07/21/2020 CLINICAL DATA:  80 year old female with fall and trauma to the left lower extremity. EXAM: LEFT KNEE - 1-2 VIEW; LEFT TIBIA AND FIBULA - 2 VIEW; LEFT ANKLE COMPLETE - 3+ VIEW COMPARISON:  Left knee radiograph dated 04/09/2020. FINDINGS: A small bone  fragment adjacent to the tip of the medial malleolus may be chronic or represent an acute avulsion fracture. Correlation with point tenderness recommended. No other acute fracture identified. The bones are osteopenic. There is no dislocation. There is a total left knee arthroplasty. The arthroplasty components appear intact and in anatomic alignment. The soft tissues are grossly unremarkable. IMPRESSION: Chronic changes versus possible acute avulsion fracture of the tip of the medial malleolus. Correlation with point tenderness recommended. Electronically Signed   By: Anner Crete M.D.   On: 07/21/2020 16:37   DG Ankle Complete Left  Result Date: 07/21/2020 CLINICAL DATA:  80 year old female with fall and trauma to the left lower extremity. EXAM: LEFT KNEE - 1-2 VIEW; LEFT TIBIA AND FIBULA - 2 VIEW; LEFT ANKLE COMPLETE - 3+ VIEW COMPARISON:  Left knee radiograph dated 04/09/2020. FINDINGS: A small bone fragment adjacent to the tip of the medial malleolus may be chronic or represent an acute avulsion fracture. Correlation with point tenderness recommended. No other acute fracture identified. The bones are osteopenic. There is no dislocation. There is a total left knee arthroplasty. The arthroplasty components appear intact and in anatomic alignment. The soft tissues are grossly unremarkable. IMPRESSION: Chronic changes versus possible acute avulsion fracture of the tip of the medial malleolus. Correlation with point tenderness recommended. Electronically  Signed   By: Anner Crete M.D.   On: 07/21/2020 16:37    Procedures Procedures   Medications Ordered in ED Medications  mometasone-formoterol (DULERA) 100-5 MCG/ACT inhaler 2 puff (has no administration in time range)  busPIRone (BUSPAR) tablet 10 mg (has no administration in time range)  albuterol (VENTOLIN HFA) 108 (90 Base) MCG/ACT inhaler 2 puff (has no administration in time range)  clopidogrel (PLAVIX) tablet 75 mg (has no administration in time range)  donepezil (ARICEPT) tablet 10 mg (10 mg Oral Given 07/21/20 2330)  nortriptyline (PAMELOR) capsule 50 mg (50 mg Oral Given 07/21/20 2331)  rosuvastatin (CRESTOR) tablet 20 mg (has no administration in time range)  clonazePAM (KLONOPIN) tablet 0.5 mg (0.5 mg Oral Given 07/21/20 2330)  clonazePAM (KLONOPIN) tablet 1 mg (has no administration in time range)  levothyroxine (SYNTHROID) tablet 100 mcg (has no administration in time range)  acetaminophen (TYLENOL) tablet 650 mg (650 mg Oral Given 07/21/20 1628)  traMADol (ULTRAM) tablet 50 mg (50 mg Oral Given 07/21/20 1628)    ED Course  I have reviewed the triage vital signs and the nursing notes.  Pertinent labs & imaging results that were available during my care of the patient were reviewed by me and considered in my medical decision making (see chart for details).  80 yo female here with mechanical fall, low impact, with primary pain and tenderness around her ankle, left sided.  She is neurovascularly intact.  There are no clinical history or signs of head trauma.  Have a low suspicion intracranial bleed.  No other injuries noted on exam.  X-rays were obtained and personally reviewed.  She likely has a minimally displaced malleolar tip avulsion fracture.  I gave her Tylenol and tramadol for pain.  We will place her in a cam boot.  Given her habitus she likely need to bear some small amount of weight on this foot, but I do think that is reasonable she can follow-up with Ortho.  She did  feel that she might be mildly dehydrated.  Her vital signs are normal.  However did agree to check a CBC and BMP.  I reviewed his labs does not show any  signs of significant dehydration or any significant variation from her recent baseline.  I encouraged her to keep drinking water.  Clinical Course as of 07/21/20 2343  Tue Jul 21, 2020  2132 On repeated attempts to ambulate, the patient was unable to bear any weight at all on her left leg.  She reports that both the boot and a knee immobilizer are too heavy.  She states that she is essentially wheelchair-bound at baseline, and has not been ambulating for some time, but at baseline can pivot from the wheelchair to the toilet into the bed.  Her husband has been helping her at home but he is also elderly and cannot lift her entire weight.  She has physical therapy that comes out once a week.  They think they need additional resources.  She does want to go home if possible.  However, at this point, given that she is not able to bear any weight or pivot out of the wheelchair, and that she is on A/C and a high fall risk, I discussed with her the option of staying for PT evaluation in the morning.  She and her husband agree to this.  I will also place a transition of care and social work consult [MT]  2133 She follows with Dr Marlou Sa from ortho and can see him in 1-2 weeks as f/u for her foot fracture [MT]    Clinical Course User Index [MT] Michiah Masse, Carola Rhine, MD   Update - I've ordered all of the patient's home medications except for her insulin.  We cannot verify the correct dosing so far with the patient (she thought it was 200 units, but this seems alarmingly high).  We are having our staff try to perform a med rec - she will need insulin appropriately dosed tomorrow.  For now BS has come down to 200.   Final Clinical Impression(s) / ED Diagnoses Final diagnoses:  Closed fracture of malleolus of left ankle, initial encounter  Fall, initial encounter  Left  leg pain    Rx / DC Orders ED Discharge Orders          Ordered    acetaminophen (TYLENOL) 325 MG tablet  Every 6 hours PRN        07/21/20 1858    traMADol (ULTRAM) 50 MG tablet  Every 12 hours PRN        07/21/20 1858             Wyvonnia Dusky, MD 07/21/20 2344

## 2020-07-21 NOTE — Progress Notes (Signed)
Orthopedic Tech Progress Note Patient Details:  Tara Jackson [facility] 09/02/1940 [id number]  Ortho Devices Type of Ortho Device: CAM walker Ortho Device/Splint Location: left Ortho Device/Splint Interventions: Application   Post Interventions Patient Tolerated: Well Instructions Provided: Care of device  [name] 07/21/2020, 6:34 PM

## 2020-07-21 NOTE — Progress Notes (Signed)
Orthopedic Tech Progress Note Patient Details:  Tara Jackson [facility] Oct 11, 1940 [id number]  Ortho Devices Type of Ortho Device: Knee Immobilizer Ortho Device/Splint Location: left Ortho Device/Splint Interventions: Application   Post Interventions Patient Tolerated: Well Instructions Provided: Care of device  Tara Jackson 07/21/2020, 7:34 PM

## 2020-07-21 NOTE — Discharge Instructions (Addendum)
Please call to schedule a follow up appointment with your orthopedic doctor in 1-2 weeks.  Use your walker at ALL times. Wear your boot at all times.

## 2020-07-21 NOTE — ED Notes (Signed)
Pt placed on purewick 

## 2020-07-22 LAB — RESP PANEL BY RT-PCR (FLU A&B, COVID) ARPGX2
Influenza A by PCR: NEGATIVE
Influenza B by PCR: NEGATIVE
SARS Coronavirus 2 by RT PCR: NEGATIVE

## 2020-07-22 LAB — CBG MONITORING, ED
Glucose-Capillary: 222 mg/dL — ABNORMAL HIGH (ref 70–99)
Glucose-Capillary: 295 mg/dL — ABNORMAL HIGH (ref 70–99)

## 2020-07-22 MED ORDER — INSULIN ASPART 100 UNIT/ML IJ SOLN
10.0000 [IU] | Freq: Once | INTRAMUSCULAR | Status: AC
  Administered 2020-07-23: 10 [IU] via SUBCUTANEOUS
  Filled 2020-07-22: qty 0.1

## 2020-07-22 MED ORDER — TRAMADOL HCL 50 MG PO TABS
50.0000 mg | ORAL_TABLET | Freq: Once | ORAL | Status: AC
  Administered 2020-07-22: 50 mg via ORAL
  Filled 2020-07-22: qty 1

## 2020-07-22 NOTE — [place] FL2 (Deleted)
[place] MEDICAID FL2 LEVEL OF CARE SCREENING TOOL     IDENTIFICATION  Patient Name: Tara Jackson Birthdate: 10-03-1940 Sex: female Admission Date (Current Location): 07/21/2020  [facility] [place] Number:  Producer, television/film/video and Address:  [facility],  [address] [place], [place] [address]      Provider Number: [id number]  Attending Physician Name and Address:  Default, Provider, MD  Relative Name and Phone Number:  Tara Jackson # [phone]    Current Level of Care: Hospital Recommended Level of Care: Skilled Nursing Facility Prior Approval Number:    Date Approved/Denied:   PASRR Number: [id number]  Discharge Plan: SNF    Current Diagnoses: Patient Active Problem List   Diagnosis Date Noted   Anxiety and depression 04/10/2020   Hyperlipidemia 04/10/2020   Lower extremity weakness 04/09/2020   Dry mouth 08/12/2019   Chronic respiratory failure with hypoxia (HCC) 07/26/2018   Cough variant asthma vs uacs  07/25/2018   Syncope due to orthostatic hypotension 09/12/2017   Orthostatic dizziness 06/12/2017   Diabetic neuropathy (HCC) 12/13/2016   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   Acute renal failure superimposed on chronic kidney disease (HCC) 10/24/2016   Neurocardiogenic syncope 10/23/2016   Chronic anemia 10/11/2016   Pacemaker 04/26/2015   Chronic daily headache 04/07/2015   Symptomatic bradycardia 03/24/2015   Sinus node dysfunction (HCC) 03/24/2015   Chronic diastolic heart failure (HCC)    Diabetes mellitus type 2 in obese (HCC)    Normal coronary arteries 01/14/2015   Normal cardiac stress test 11/30/2014   Bradycardia 11/28/2014   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   Morbid obesity (HCC) 11/28/2014   Chronic kidney disease, stage 3b (HCC) 11/28/2014   Memory loss 06/03/2014   AKI (acute kidney injury) (HCC)    Chest pain 04/19/2014   Pleuritic chest pain 04/19/2014   Diabetes mellitus (HCC)  04/19/2014   Edema of both legs 04/19/2014   Dyspnea    DOE (dyspnea on exertion)    Tremor of right hand 09/20/2013   Headache(784.0) 09/19/2013   Restless legs syndrome (RLS) 09/19/2013   Constipation due to pain medication 08/21/2013   Ankle edema 08/21/2013   PVC's (premature ventricular contractions) 02/10/2013   Diastolic dysfunction, left ventricle    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   PULMONARY EMBOLISM, HX OF 10/02/2009   Rhinitis 04/07/2009   Sleep apnea 07/09/2008   Hyperlipidemia with target LDL less than 130 05/20/2008   Essential hypertension 05/20/2008    Orientation RESPIRATION BLADDER Height & Weight     Self, Time, Situation, Place  Normal Incontinent Weight: 108.9 kg Height:  5\' 7"  (170.2 cm)  BEHAVIORAL SYMPTOMS/MOOD NEUROLOGICAL BOWEL NUTRITION STATUS      Continent Diet (Cardiac/Carb modified diet)  AMBULATORY STATUS COMMUNICATION OF NEEDS Skin   Extensive Assist Verbally Normal                       Personal Care Assistance Level of Assistance  Bathing, Feeding, Dressing Bathing Assistance: Limited assistance Feeding assistance: Independent Dressing Assistance: Maximum assistance     Functional Limitations Info  Sight, Hearing, Speech Sight Info: Adequate Hearing Info: Adequate Speech Info: Adequate    SPECIAL CARE FACTORS FREQUENCY  PT (By licensed PT), OT (By licensed OT)     PT Frequency: 5x per week OT Frequency: 5x per week            Contractures Contractures Info: Not present  Additional Factors Info  Code Status, Allergies Code Status Info: Full Code Allergies Info: Iodine, Iohexol, Metoclopramide, Sulfa antibiotics, Lactose Intolerance, Lansoprazole           Current Medications (07/22/2020):  This is the current hospital active medication list Current Facility-Administered Medications  Medication Dose Route Frequency Provider Last Rate Last Admin   acetaminophen (TYLENOL) tablet 650 mg  650 mg Oral Q6H PRN Terald Sleeper, MD   650 mg at 07/22/20 0925   albuterol (VENTOLIN HFA) 108 (90 Base) MCG/ACT inhaler 2 puff  2 puff Inhalation Q6H PRN Terald Sleeper, MD       busPIRone (BUSPAR) tablet 10 mg  10 mg Oral q morning Terald Sleeper, MD   10 mg at 07/22/20 1610   clonazePAM (KLONOPIN) tablet 0.5 mg  0.5 mg Oral QHS Terald Sleeper, MD   0.5 mg at 07/21/20 2330   clonazePAM (KLONOPIN) tablet 1 mg  1 mg Oral Daily Terald Sleeper, MD   1 mg at 07/22/20 9604   clopidogrel (PLAVIX) tablet 75 mg  75 mg Oral q morning Terald Sleeper, MD   75 mg at 07/22/20 0915   donepezil (ARICEPT) tablet 10 mg  10 mg Oral QHS Terald Sleeper, MD   10 mg at 07/21/20 2330   levothyroxine (SYNTHROID) tablet 100 mcg  100 mcg Oral Q0600 Terald Sleeper, MD   100 mcg at 07/22/20 0544   mometasone-formoterol (DULERA) 100-5 MCG/ACT inhaler 2 puff  2 puff Inhalation BID Terald Sleeper, MD   2 puff at 07/22/20 0757   nortriptyline (PAMELOR) capsule 50 mg  50 mg Oral BID Terald Sleeper, MD   50 mg at 07/22/20 0915   rosuvastatin (CRESTOR) tablet 20 mg  20 mg Oral Daily Terald Sleeper, MD   20 mg at 07/22/20 0915   Current Outpatient Medications  Medication Sig Dispense Refill   acetaminophen (TYLENOL) 325 MG tablet Take 2 tablets (650 mg total) by mouth every 6 (six) hours as needed for up to 30 doses for mild pain or moderate pain. 30 tablet 0   acetaminophen (TYLENOL) 650 MG CR tablet Take 1,300 mg by mouth every 8 (eight) hours as needed for pain (arthritis).     albuterol (VENTOLIN HFA) 108 (90 Base) MCG/ACT inhaler INHALE 2 INHALATIONS 3 TIMES A DAY AS NEEDED FOR COUGH/WHEEZE (Patient taking differently: Inhale 2 puffs into the lungs every 8 (eight) hours as needed for wheezing or shortness of breath.) 18 each PRN   budesonide-formoterol (SYMBICORT) 80-4.5 MCG/ACT inhaler Inhale 2 puffs into the lungs 2 (two) times daily.     busPIRone (BUSPAR) 10 MG tablet Take 1 tablet (10 mg total) by mouth every  morning. 60 tablet 1   Cholecalciferol (VITAMIN D-3) 25 MCG (1000 UT) CAPS Take 2,000 Units by mouth every morning.     clonazePAM (KLONOPIN) 0.5 MG tablet Take 0.5-1 mg by mouth See admin instructions. Takes 1mg  in am and 0.5mg  in pm.     clopidogrel (PLAVIX) 75 MG tablet Take 75 mg by mouth every morning.     dicyclomine (BENTYL) 20 MG tablet Take 40 mg by mouth in the morning and at bedtime.     donepezil (ARICEPT) 10 MG tablet Take 1 tablet (10 mg total) by mouth at bedtime. 90 tablet 3   esomeprazole (NEXIUM) 40 MG capsule Take 40 mg by mouth daily.     furosemide (LASIX) 20 MG tablet Take 1 tablet (20 mg  total) by mouth daily as needed for fluid or edema. As directed (Patient taking differently: Take 20 mg by mouth daily. As directed) 90 tablet 2   glipiZIDE (GLUCOTROL) 10 MG tablet Take 20 mg by mouth 2 (two) times daily before a meal.      guaiFENesin (MUCINEX) 600 MG 12 hr tablet Take 600 mg by mouth 2 (two) times daily as needed for cough.     insulin NPH (HUMULIN N,NOVOLIN N) 100 UNIT/ML injection Inject 2-15 Units into the skin See admin instructions. Novolin-N Give 20 unit in morning; give  5 units at 4pm; evening dose as needed.  If less than 150 blood sugar= 0units.  If greater than 200 blood sugar= give 10units to decrease by 100 blood sugar     insulin regular (NOVOLIN R,HUMULIN R) 100 units/mL injection Inject 0-30 Units into the skin See admin instructions. Give 25 unit in morning; give 20units at 4pm; evening dose as needed.  If less than 150 blood sugar= 0units.  If greater than 200 blood sugar= give 10units to decrease by 100 blood sugar     levothyroxine (SYNTHROID) 100 MCG tablet Take 100 mcg by mouth daily before breakfast.     lidocaine (LIDODERM) 5 % Place 2 patches onto the skin daily as needed (pain). Remove & Discard patch within 12 hours or as directed by MD     linaclotide (LINZESS) 290 MCG CAPS capsule Take 290 mcg by mouth daily before breakfast.     Menthol-Methyl  Salicylate (MUSCLE RUB) 10-15 % CREA Apply 1 application topically as needed for muscle pain.     nortriptyline (PAMELOR) 50 MG capsule TAKE 2 CAPSULES (100MG ) AT BEDTIME (Patient taking differently: Take 50 mg by mouth daily.) 180 capsule 1   OXYGEN Inhale 2 L into the lungs as needed. Used at overnight with a concentrator     oxymetazoline (AFRIN NASAL SPRAY) 0.05 % nasal spray Place 1 spray into both nostrils 2 (two) times daily as needed for congestion. 30 mL 0   polyethylene glycol (MIRALAX / GLYCOLAX) packet Take 17 g by mouth daily as needed for mild constipation.      potassium chloride SA (K-DUR,KLOR-CON) 20 MEQ tablet Take 1 tablet (20 mEq total) by mouth 2 (two) times daily. 10 tablet 0   rosuvastatin (CRESTOR) 20 MG tablet Take 20 mg by mouth daily.      solifenacin (VESICARE) 5 MG tablet Take 5 mg by mouth daily as needed (to prevent UTI).     terconazole (TERAZOL 7) 0.4 % vaginal cream Place 1 applicator vaginally daily as needed for itching.     traMADol (ULTRAM) 50 MG tablet Take 1 tablet (50 mg total) by mouth every 12 (twelve) hours as needed for up to 20 doses. 20 tablet 0   pantoprazole (PROTONIX) 40 MG tablet Take 1 tablet (40 mg total) by mouth daily. (Patient not taking: Reported on 07/22/2020) 90 tablet 3   vitamin B-12 (CYANOCOBALAMIN) 1000 MCG tablet Take 1 tablet (1,000 mcg total) by mouth daily. (Patient not taking: Reported on 07/22/2020)       Discharge Medications: Please see discharge summary for a list of discharge medications.  Relevant Imaging Results:  Relevant Lab Results:   Additional Information [id number] vaccinated and boosted  Ainsleigh Kakos, Davene Costain, RN

## 2020-07-22 NOTE — ED Notes (Signed)
Physical Therapy is at the bedside

## 2020-07-22 NOTE — Evaluation (Signed)
Physical Therapy Evaluation Patient Details Name: Tara Jackson MRN: [id number] DOB: [birth year] Today's Date: 07/22/2020   History of Present Illness   Patient is a 80 y.o. female presenting to the ED after a fall.  She reports that she slipped in her room today on a carpet and landed on her left knee.  She feels like she hyperflexed her left knee, felt a pop, and had pain bearing weight. X-rays in ED revealed avulsion fractureof Lt medial malleolus, no acute injury to knee. PMH significant for OA, CKD, anemia, anxiety, DM, nuropathy, DVT, HLD, HTN, macular degeneration, osteoporois, PE, CVA in 2015, Bil TKA, ack surgery, pacemaker placement.    Clinical Impression  Tara Jackson is 80 y.o. female admitted with above HPI and diagnosis. Patient is currently limited by functional impairments below (see PT problem list). Patient lives with her husband and is uses hover-round scooter for mobility and requires assist from husband for ADL's and trasners at baseline. Currently pt is limited by Lt knee and ankle pain and requires Mod-Max assist for bed mobility and sit<>stand transfers. Patient was unable to take small steps today with RW and 2+ assist and would be unsafe to transfer bed<>wheelchair with one person assist of husband at home. Patient will benefit from continued skilled PT interventions to address impairments and progress independence with mobility, recommending SNF follow up for therapy. Acute PT will follow and progress as able.      PT Recommendation  Recommendations for Other Services OT consult  Follow Up Recommendations SNF  PT equipment None recommended by PT  Individuals Consulted  Consulted and Agree with Results and Recommendations Patient   Precautions / Restrictions Precautions  Precautions Fall  Precaution Comments CAM boot and immobilizer given to pt, RN states more to assist with pain relief, pt does not want to wear eithter.  Restrictions  Weight Bearing  Restrictions Yes  LLE Weight Bearing PWB  LLE Partial Weight Bearing Percentage or Pounds order from EDP: "can be weight bearing, but patient refusing due to pain"      07/22/20 1100  PT Visit Information  Last PT Received On 07/22/20  Assistance Needed +2  History of Present Illness Patient is a 80 y.o. female presenting to the ED after a fall.  She reports that she slipped in her room today on a carpet and landed on her left knee.  She feels like she hyperflexed her left knee, felt a pop, and had pain bearing weight. X-rays in ED revealed avulsion fractureof Lt medial malleolus, no acute injury to knee. PMH significant for OA, CKD, anemia, anxiety, DM, nuropathy, DVT, HLD, HTN, macular degeneration, osteoporois, PE, CVA in 2015, Bil TKA, ack surgery, pacemaker placement.  Home Living  Family/patient expects to be discharged to: Private residence  Living Arrangements Spouse/significant other  Available Help at Discharge Family  Type of [place] One level  Bathroom Shower/Tub Walk-in shower  [place] height  Bathroom Accessibility Yes  Tax adviser scooter;Wheelchair - [employer] - 2 wheels (platform walker, hoverround)  Prior Function  Level of Independence Needs assistance  Gait / Transfers Assistance Needed Uses hoverround (electric scooter) around home, has used for last ~3 years with increased use in last year. husband provides supervision for mobility and assists with transfers to/from wheelchair  ADL's / [place] Sits down on commode for sponge bathing at sink, use of sink to stand up from raised toilet seat; husband assists  with bathing, dressing; wears diapers/briefs at home; husband assists with medication management  Communication  Communication No difficulties  Pain Assessment  Pain Assessment Faces  Faces Pain Scale 6  Pain Location Lt knee  Pain Descriptors / Indicators  Aching;Discomfort;Grimacing  Pain Intervention(s) Limited activity within patient's tolerance;Monitored during session;Repositioned  Cognition  Arousal/Alertness Awake/alert  Behavior During Therapy [facility] for tasks assessed/performed  Overall Cognitive Status No family/caregiver present to determine baseline cognitive functioning  General Comments pt is A&Ox4 and pleasant following cues.  Upper Extremity Assessment  Upper Extremity Assessment Generalized weakness  Lower Extremity Assessment  Lower Extremity Assessment Generalized weakness  Cervical / Trunk Assessment  Cervical / Trunk Assessment Other exceptions;Kyphotic  Cervical / Trunk Exceptions significnat trunk weakness, pt requires UE use to maintain upright sitting at EOB.  Bed Mobility  Overal bed mobility Needs Assistance  Bed Mobility Supine to Sit;Sit to Supine  Supine to sit Mod assist;HOB elevated  Sit to supine Max assist;+2 for physical assistance;+2 for safety/equipment  General bed mobility comments Mod Assist for raising trunk upright, pt able to walk LE's off EOB and use bed rail to begin raising trunk, unable to completely bring hest and chest forward to sit upright. 2+ Max assist to return to supine on stretcher.  Transfers  Overall transfer level Needs assistance  Equipment used Rolling walker (2 wheeled)  Transfers Sit to/from Stand  Sit to Stand Mod assist;+2 safety/equipment;+2 physical assistance  General transfer comment Mod+2 assist for sit<>stand from EOB(stretcher) and pt limited WB on Lt LE due to pain. Unable to take small side steps towards [facility] due to Lt knee pain.  Balance  Overall balance assessment Needs assistance  Sitting-balance support Feet supported;Bilateral upper extremity supported  Sitting balance-Leahy Scale Poor  Sitting balance - Comments min guard but assist required to obtain balance and prevent posterior lean sitting EOB.  Postural control Posterior lean  Standing balance support  Bilateral upper extremity supported  Standing balance-Leahy Scale Poor  PT - End of Session  Equipment Utilized During Treatment Gait belt  Activity Tolerance Patient limited by pain  Patient left in bed;with call bell/phone within reach  Nurse Communication Mobility status  PT Assessment  PT Recommendation/Assessment Patient needs continued PT services  PT Visit Diagnosis Unsteadiness on feet (R26.81);Muscle weakness (generalized) (M62.81);Pain  Pain - Right/Left Left  Pain - part of body Knee;Ankle and joints of foot  PT Problem List Decreased strength;Decreased range of motion;Decreased activity tolerance;Decreased balance;Decreased mobility;Decreased knowledge of use of DME;Decreased safety awareness;Pain;Obesity  PT Plan  PT Frequency (ACUTE ONLY) Min 2X/week  PT Treatment/Interventions (ACUTE ONLY) DME instruction;Gait training;Functional mobility training;Therapeutic activities;Balance training;Therapeutic exercise;Patient/family education  AM-PAC PT "6 Clicks" Mobility Outcome Measure (Version 2)  Help needed turning from your back to your side while in a flat bed without using bedrails? 2  Help needed moving from lying on your back to sitting on the side of a flat bed without using bedrails? 2  Help needed moving to and from a bed to a chair (including a wheelchair)? 1  Help needed standing up from a chair using your arms (e.g., wheelchair or bedside chair)? 1  Help needed to walk in hospital room? 1  Help needed climbing 3-5 steps with a railing?  1  6 Click Score 8  Consider Recommendation of Discharge To: CIR/SNF/LTACH  Acute Rehab PT Goals  Patient Stated Goal knee to stop hurting and to be able to transfer and return home  PT Goal Formulation With patient  Time For Goal Achievement 08/05/20  Potential to Achieve Goals Fair  PT Time Calculation  PT Start Time (ACUTE ONLY) 1116  PT Stop Time (ACUTE ONLY) 1143  PT Time Calculation (min) (ACUTE ONLY) 27 min  PT General  Charges  $$ ACUTE PT VISIT 1 Visit  PT Evaluation  $PT Eval Moderate Complexity 1 Mod  PT Treatments  $Therapeutic Activity 8-22 mins  Written Expression  Dominant Hand Right     Verner Mould, DPT Acute Rehabilitation Services Office [phone] Pager [phone]   Jacques Navy 07/22/2020, 3:12 PM

## 2020-07-22 NOTE — Progress Notes (Signed)
.  Transition of Care [facility]) - Emergency Department Mini Assessment   Patient Details  Name: Tara Jackson MRN: [id number] Date of Birth: [birth year]  Transition of Care [facility]) CM/SW Contact:    Erenest Rasher, RN Phone Number: [phone] 07/22/2020, 2:39 PM   Clinical Narrative:  TOC CM spoke to pt and husband at bedside. PT recommended SNF. Pt agreeable to SNF rehab, gave permission to create FL2 and fax referral to SNF rehab. Pt report she had PT coming out once per week at home. She has RW, wheelchair and lift chair. States they have bedside commode ordered.   ED Mini Assessment: What brought you to the Emergency Department? : ankle fracture  Barriers to Discharge: SNF Pending bed offer  Barrier interventions: faxed referral to SNF     Interventions which prevented an admission or readmission: SNF Placement    Patient Contact and Communications Key Contact 1: Hilliard Clark   Spoke with: husband Contact Date: 07/22/20,   Contact time: [place] Contact Phone Number: [phone]    Patient states their goals for this hospitalization and ongoing recovery are:: requesting pt go to rehab CMS Medicare.gov Compare Post Acute Care list provided to:: Patient Choice offered to / list presented to : Patient  Admission diagnosis:  Fall, Leg pain, Blood thinner Patient Active Problem List   Diagnosis Date Noted   Anxiety and depression 04/10/2020   Hyperlipidemia 04/10/2020   Lower extremity weakness 04/09/2020   Dry mouth 08/12/2019   Chronic respiratory failure with hypoxia ([place]) 07/26/2018   Cough variant asthma vs uacs  07/25/2018   Syncope due to orthostatic hypotension 09/12/2017   Orthostatic dizziness 06/12/2017   Diabetic neuropathy ([place]) 12/13/2016   Epileptic drop attack ([place]) 12/01/2016   Acute renal failure superimposed on chronic kidney disease ([place]) 10/24/2016   Neurocardiogenic syncope 10/23/2016   Chronic anemia 10/11/2016   Pacemaker 04/26/2015   Chronic  daily headache 04/07/2015   Symptomatic bradycardia 03/24/2015   Sinus node dysfunction (HCC) 03/24/2015   Chronic diastolic heart failure ([place])    Diabetes mellitus type 2 in obese ([place])    Normal coronary arteries 01/14/2015   Normal cardiac stress test 11/30/2014   Bradycardia 11/28/2014   Type 2 diabetes mellitus with renal manifestations ([place]) 11/28/2014   Morbid obesity ([place]) 11/28/2014   Chronic kidney disease, stage 3b ([place]) 11/28/2014   Memory loss 06/03/2014   AKI (acute kidney injury) ([place])    Chest pain 04/19/2014   Pleuritic chest pain 04/19/2014   Diabetes mellitus ([place]) 04/19/2014   Edema of both legs 04/19/2014   Dyspnea    DOE (dyspnea on exertion)    Tremor of right hand 09/20/2013   Headache(784.0) 09/19/2013   Restless legs syndrome (RLS) 09/19/2013   Constipation due to pain medication 08/21/2013   Ankle edema 08/21/2013   PVC's (premature ventricular contractions) 30/08/6224   Diastolic dysfunction, left ventricle    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 [place] [address] Phone: [phone] Fax: [phone]  [place], [address]. [address]. [place] [address] Phone: [phone] Fax: [phone]

## 2020-07-22 NOTE — ED Notes (Signed)
Patient was given her lunch tray and a diet soda. Patients husband was given a sandwich and a soda.

## 2020-07-22 NOTE — ED Notes (Signed)
No pharmacy tech is available for a med rec tonight; one is supposed to be coming on shift in the morning.

## 2020-07-22 NOTE — ED Notes (Signed)
Patient was given a warm blanket.

## 2020-07-22 NOTE — NC FL2 (Signed)
[place] MEDICAID FL2 LEVEL OF CARE SCREENING TOOL     IDENTIFICATION  Patient Name: Tara Jackson Birthdate: 10-03-1940 Sex: female Admission Date (Current Location): 07/21/2020  [facility] [place] Number:  Producer, television/film/video and Address:  [facility],  [address] [place], [place] [address]      Provider Number: [id number]  Attending Physician Name and Address:  Default, Provider, MD  Relative Name and Phone Number:  Humna Stuckwisch # [phone]    Current Level of Care: Hospital Recommended Level of Care: Skilled Nursing Facility Prior Approval Number:    Date Approved/Denied:   PASRR Number: [id number]  Discharge Plan: SNF    Current Diagnoses: Patient Active Problem List   Diagnosis Date Noted   Anxiety and depression 04/10/2020   Hyperlipidemia 04/10/2020   Lower extremity weakness 04/09/2020   Dry mouth 08/12/2019   Chronic respiratory failure with hypoxia (HCC) 07/26/2018   Cough variant asthma vs uacs  07/25/2018   Syncope due to orthostatic hypotension 09/12/2017   Orthostatic dizziness 06/12/2017   Diabetic neuropathy (HCC) 12/13/2016   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   Acute renal failure superimposed on chronic kidney disease (HCC) 10/24/2016   Neurocardiogenic syncope 10/23/2016   Chronic anemia 10/11/2016   Pacemaker 04/26/2015   Chronic daily headache 04/07/2015   Symptomatic bradycardia 03/24/2015   Sinus node dysfunction (HCC) 03/24/2015   Chronic diastolic heart failure (HCC)    Diabetes mellitus type 2 in obese (HCC)    Normal coronary arteries 01/14/2015   Normal cardiac stress test 11/30/2014   Bradycardia 11/28/2014   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   Morbid obesity (HCC) 11/28/2014   Chronic kidney disease, stage 3b (HCC) 11/28/2014   Memory loss 06/03/2014   AKI (acute kidney injury) (HCC)    Chest pain 04/19/2014   Pleuritic chest pain 04/19/2014   Diabetes mellitus (HCC)  04/19/2014   Edema of both legs 04/19/2014   Dyspnea    DOE (dyspnea on exertion)    Tremor of right hand 09/20/2013   Headache(784.0) 09/19/2013   Restless legs syndrome (RLS) 09/19/2013   Constipation due to pain medication 08/21/2013   Ankle edema 08/21/2013   PVC's (premature ventricular contractions) 02/10/2013   Diastolic dysfunction, left ventricle    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   PULMONARY EMBOLISM, HX OF 10/02/2009   Rhinitis 04/07/2009   Sleep apnea 07/09/2008   Hyperlipidemia with target LDL less than 130 05/20/2008   Essential hypertension 05/20/2008    Orientation RESPIRATION BLADDER Height & Weight     Self, Time, Situation, Place  Normal Incontinent Weight: 108.9 kg Height:  5\' 7"  (170.2 cm)  BEHAVIORAL SYMPTOMS/MOOD NEUROLOGICAL BOWEL NUTRITION STATUS      Continent Diet (Cardiac/Carb modified diet)  AMBULATORY STATUS COMMUNICATION OF NEEDS Skin   Extensive Assist Verbally Normal                       Personal Care Assistance Level of Assistance  Bathing, Feeding, Dressing Bathing Assistance: Limited assistance Feeding assistance: Independent Dressing Assistance: Maximum assistance     Functional Limitations Info  Sight, Hearing, Speech Sight Info: Adequate Hearing Info: Adequate Speech Info: Adequate    SPECIAL CARE FACTORS FREQUENCY  PT (By licensed PT), OT (By licensed OT)     PT Frequency: 5x per week OT Frequency: 5x per week            Contractures Contractures Info: Not present  Additional Factors Info  Code Status, Allergies Code Status Info: Full Code Allergies Info: Iodine, Iohexol, Metoclopramide, Sulfa antibiotics, Lactose Intolerance, Lansoprazole           Current Medications (07/22/2020):  This is the current hospital active medication list Current Facility-Administered Medications  Medication Dose Route Frequency Provider Last Rate Last Admin   acetaminophen (TYLENOL) tablet 650 mg  650 mg Oral Q6H PRN Terald Sleeper, MD   650 mg at 07/22/20 0925   albuterol (VENTOLIN HFA) 108 (90 Base) MCG/ACT inhaler 2 puff  2 puff Inhalation Q6H PRN Terald Sleeper, MD       busPIRone (BUSPAR) tablet 10 mg  10 mg Oral q morning Terald Sleeper, MD   10 mg at 07/22/20 1610   clonazePAM (KLONOPIN) tablet 0.5 mg  0.5 mg Oral QHS Terald Sleeper, MD   0.5 mg at 07/21/20 2330   clonazePAM (KLONOPIN) tablet 1 mg  1 mg Oral Daily Terald Sleeper, MD   1 mg at 07/22/20 9604   clopidogrel (PLAVIX) tablet 75 mg  75 mg Oral q morning Terald Sleeper, MD   75 mg at 07/22/20 0915   donepezil (ARICEPT) tablet 10 mg  10 mg Oral QHS Terald Sleeper, MD   10 mg at 07/21/20 2330   levothyroxine (SYNTHROID) tablet 100 mcg  100 mcg Oral Q0600 Terald Sleeper, MD   100 mcg at 07/22/20 0544   mometasone-formoterol (DULERA) 100-5 MCG/ACT inhaler 2 puff  2 puff Inhalation BID Terald Sleeper, MD   2 puff at 07/22/20 0757   nortriptyline (PAMELOR) capsule 50 mg  50 mg Oral BID Terald Sleeper, MD   50 mg at 07/22/20 0915   rosuvastatin (CRESTOR) tablet 20 mg  20 mg Oral Daily Terald Sleeper, MD   20 mg at 07/22/20 0915   Current Outpatient Medications  Medication Sig Dispense Refill   acetaminophen (TYLENOL) 325 MG tablet Take 2 tablets (650 mg total) by mouth every 6 (six) hours as needed for up to 30 doses for mild pain or moderate pain. 30 tablet 0   acetaminophen (TYLENOL) 650 MG CR tablet Take 1,300 mg by mouth every 8 (eight) hours as needed for pain (arthritis).     albuterol (VENTOLIN HFA) 108 (90 Base) MCG/ACT inhaler INHALE 2 INHALATIONS 3 TIMES A DAY AS NEEDED FOR COUGH/WHEEZE (Patient taking differently: Inhale 2 puffs into the lungs every 8 (eight) hours as needed for wheezing or shortness of breath.) 18 each PRN   budesonide-formoterol (SYMBICORT) 80-4.5 MCG/ACT inhaler Inhale 2 puffs into the lungs 2 (two) times daily.     busPIRone (BUSPAR) 10 MG tablet Take 1 tablet (10 mg total) by mouth every  morning. 60 tablet 1   Cholecalciferol (VITAMIN D-3) 25 MCG (1000 UT) CAPS Take 2,000 Units by mouth every morning.     clonazePAM (KLONOPIN) 0.5 MG tablet Take 0.5-1 mg by mouth See admin instructions. Takes 1mg  in am and 0.5mg  in pm.     clopidogrel (PLAVIX) 75 MG tablet Take 75 mg by mouth every morning.     dicyclomine (BENTYL) 20 MG tablet Take 40 mg by mouth in the morning and at bedtime.     donepezil (ARICEPT) 10 MG tablet Take 1 tablet (10 mg total) by mouth at bedtime. 90 tablet 3   esomeprazole (NEXIUM) 40 MG capsule Take 40 mg by mouth daily.     furosemide (LASIX) 20 MG tablet Take 1 tablet (20 mg  total) by mouth daily as needed for fluid or edema. As directed (Patient taking differently: Take 20 mg by mouth daily. As directed) 90 tablet 2   glipiZIDE (GLUCOTROL) 10 MG tablet Take 20 mg by mouth 2 (two) times daily before a meal.      guaiFENesin (MUCINEX) 600 MG 12 hr tablet Take 600 mg by mouth 2 (two) times daily as needed for cough.     insulin NPH (HUMULIN N,NOVOLIN N) 100 UNIT/ML injection Inject 2-15 Units into the skin See admin instructions. Novolin-N Give 20 unit in morning; give  5 units at 4pm; evening dose as needed.  If less than 150 blood sugar= 0units.  If greater than 200 blood sugar= give 10units to decrease by 100 blood sugar     insulin regular (NOVOLIN R,HUMULIN R) 100 units/mL injection Inject 0-30 Units into the skin See admin instructions. Give 25 unit in morning; give 20units at 4pm; evening dose as needed.  If less than 150 blood sugar= 0units.  If greater than 200 blood sugar= give 10units to decrease by 100 blood sugar     levothyroxine (SYNTHROID) 100 MCG tablet Take 100 mcg by mouth daily before breakfast.     lidocaine (LIDODERM) 5 % Place 2 patches onto the skin daily as needed (pain). Remove & Discard patch within 12 hours or as directed by MD     linaclotide (LINZESS) 290 MCG CAPS capsule Take 290 mcg by mouth daily before breakfast.     Menthol-Methyl  Salicylate (MUSCLE RUB) 10-15 % CREA Apply 1 application topically as needed for muscle pain.     nortriptyline (PAMELOR) 50 MG capsule TAKE 2 CAPSULES (100MG ) AT BEDTIME (Patient taking differently: Take 50 mg by mouth daily.) 180 capsule 1   OXYGEN Inhale 2 L into the lungs as needed. Used at overnight with a concentrator     oxymetazoline (AFRIN NASAL SPRAY) 0.05 % nasal spray Place 1 spray into both nostrils 2 (two) times daily as needed for congestion. 30 mL 0   polyethylene glycol (MIRALAX / GLYCOLAX) packet Take 17 g by mouth daily as needed for mild constipation.      potassium chloride SA (K-DUR,KLOR-CON) 20 MEQ tablet Take 1 tablet (20 mEq total) by mouth 2 (two) times daily. 10 tablet 0   rosuvastatin (CRESTOR) 20 MG tablet Take 20 mg by mouth daily.      solifenacin (VESICARE) 5 MG tablet Take 5 mg by mouth daily as needed (to prevent UTI).     terconazole (TERAZOL 7) 0.4 % vaginal cream Place 1 applicator vaginally daily as needed for itching.     traMADol (ULTRAM) 50 MG tablet Take 1 tablet (50 mg total) by mouth every 12 (twelve) hours as needed for up to 20 doses. 20 tablet 0   pantoprazole (PROTONIX) 40 MG tablet Take 1 tablet (40 mg total) by mouth daily. (Patient not taking: Reported on 07/22/2020) 90 tablet 3   vitamin B-12 (CYANOCOBALAMIN) 1000 MCG tablet Take 1 tablet (1,000 mcg total) by mouth daily. (Patient not taking: Reported on 07/22/2020)       Discharge Medications: Please see discharge summary for a list of discharge medications.  Relevant Imaging Results:  Relevant Lab Results:   Additional Information [id number] vaccinated and boosted  Ainsleigh Kakos, Davene Costain, RN

## 2020-07-22 NOTE — ED Notes (Signed)
The patient understands that she is waiting for rehab facility

## 2020-07-22 NOTE — Progress Notes (Signed)
TOC CM spoke to husband and he wants a [place] facility. [facility] accepted. [place] accepted referral. [facility] did not have bed available tomorrow. Holli Humbles started, reference # [id number]. [place], [place] ED TOC CM [phone]

## 2020-07-22 NOTE — Progress Notes (Addendum)
Faxed COVID vaccine to facility [facility]. [place], [place] ED TOC CM [phone]

## 2020-07-22 NOTE — ED Notes (Signed)
Social Worker is at the bedside discussing plan of care and possible rehab

## 2020-07-23 LAB — CBG MONITORING, ED
Glucose-Capillary: 343 mg/dL — ABNORMAL HIGH (ref 70–99)
Glucose-Capillary: 228 mg/dL — ABNORMAL HIGH (ref 70–99)
Glucose-Capillary: 314 mg/dL — ABNORMAL HIGH (ref 70–99)

## 2020-07-23 MED ORDER — INSULIN NPH (HUMAN) (ISOPHANE) 100 UNIT/ML ~~LOC~~ SUSP
5.0000 [IU] | Freq: Every day | SUBCUTANEOUS | Status: DC
  Administered 2020-07-23: 5 [IU] via SUBCUTANEOUS
  Filled 2020-07-23: qty 10

## 2020-07-23 MED ORDER — INSULIN NPH (HUMAN) (ISOPHANE) 100 UNIT/ML ~~LOC~~ SUSP
2.0000 [IU] | SUBCUTANEOUS | Status: DC
Start: 2020-07-23 — End: 2020-07-23

## 2020-07-23 MED ORDER — INSULIN NPH (HUMAN) (ISOPHANE) 100 UNIT/ML ~~LOC~~ SUSP
15.0000 [IU] | Freq: Every day | SUBCUTANEOUS | Status: DC
  Administered 2020-07-24: 15 [IU] via SUBCUTANEOUS

## 2020-07-23 NOTE — Progress Notes (Signed)
1230 pm TOC CM contacted [employer] for update on insurance auth. Josem Kaufmann is pending. [place], [place] ED TOC CM [phone]

## 2020-07-23 NOTE — Progress Notes (Addendum)
TOC CM provided pt and husband with choice for SNF. Pt and husband agreeable to [facility]. Jonnie Finner RN CCM, [facility] ED [facility] CM [phone]   Destination  Service Provider Request Status Selected Services Address Phone Fax Patient Preferred  [facility] SNF  Accepted N/A [address], [place] [address] [phone] --  [place] Preferred SNF  Accepted N/A [address] [place] [address] [phone] --  [facility]  Accepted N/A [place], [place] [address] [phone] --  [facility]  Accepted N/A [address] [place] [address] [phone] --  [place] Preferred SNF  Accepted N/A [address] [place] [address] [phone] --  [facility] SN[facility]/ALF  Accepted N/A [address] [place] [address] [phone] --  [facility]  Accepted N/A [address] [place] [address] [phone] --  [facility] Preferred SNF  Accepted N/A [address], [place] [address] [phone] --  [facility] SNF  Accepted N/A [address] [place] [address] [phone] --  [facility] SNF  Accepted N/A [address] [place] [address] [phone] --  [place] SNF  Pending - Request Sent N/A [address] [place] [address] [phone] --  [facility]  Pending - Request Sent N/A [address] [place] [address] [place] --  [facility] SNF  Pending - Request Sent N/A [address] [place] [address] [phone] --  Yetta Barre RESOURCES [facility] Preferred SNF  Pending - Request Sent N/A [address], [place] [address] [phone] --  [facility]  RESOURCES [facility]  Pending - Request Sent N/A [address] [place] [address] [phone] --  [place] SNF  Pending - Request Sent N/A [address] [place] [address] [phone] --  [place] Preferred SNF  Pending - Request Sent N/A [address], [place] [address] [phone] --  [place] SNF  Pending - Request Sent N/A [address], [place] [address] [phone] --  [facility] SNF  Pending - Request Sent N/A [address], [place] [address] [phone] --  [facility] SNF  Pending - Request [facility] N/A [address] [place] [address] [place] [address] [place] [address] [phone] --  [place] SNF  Pending - Request Sent N/A [address], [place] [address] [phone] --  [facility] SN[facility]  Pending - Request Sent N/A [address] [place] [address] [place] --  [facility] Preferred SNF  Pending - Request Sent N/A [address], [place] [address] [phone] --  [facility]  Pending - Request Sent N/A [address] [place] [address] [phone] --  [place] SNF  Pending - No Request Sent N/A [address], [place] [address] [phone] --  Andee Poles Preferred SNF  Declined  Insurance not in network N/A [address], [place] [address] [phone] --  [place] SNF  Declined  Bed not  available N/A [place] [address] [phone] --  [facility] SN[facility]  Declined  Bed not available N/A [address], [place] [address] [phone] --  [facility] SNF  Declined  Insurance not in network N/A [address], [place] [address] [phone] --  [facility]  Declined  Care Needs exceeding faciltity capability N/A [address], [place] [address] [phone] --  [facility] SNF  Declined  Insurance not in network N/A [address] [place] [address] [phone] --  [facility] Preferred SNF  Declined  Insurance not in [address], [place] [address] [phone] --  [facility] Preferred SNF  Declined  Insurance not in network N/A [address], [place] [address] [phone] --  [place] Preferred SNF  [place] not in [place], [place] [address] [phone] --  [place] Preferred SNF  Declined  Out of network N/A [address] [place] [address] [phone] --  [facility] Preferred SNF  Declined  No payer/insurance N/A [address]  [place] [address] [phone] --  [facility] Preferred SNF  [place] not in [place], [place] [address] [phone] --  [facility] [place] Preferred SNF  [place] not in network N/A [address], [place] [address] [phone] --  [place] Preferred SNF  [place] not in network N/A [address], [place] [address] [phone] --  [facility] Preferred SNF  [place], [place] [address] [phone] --  [place] Preferred SNF  [place] not in [place] [address] [place] [address] [phone] --  [facility] Preferred SNF  Declined N/A [address] [place] [address] [phone] --  [facility] Preferred SNF  Declined  No bed availablity N/A [address], [place] [address] [phone] --  [facility] PREFERRED SNF/ALF  Declined  Bed not available N/A [address], [place] [address] [phone] --

## 2020-07-23 NOTE — ED Provider Notes (Signed)
Pt has been waiting for SNF.  She saw PT yesterday who recommended SNF for rehab.  The pt has been accepted to [facility].  When I went to speak to pt about this, she said no one has told her anything.  She denies that she saw PT.  There are multiple messages that various nurses and SW have discussed plan with patient.  PT did a full eval yesterday at 1512.  She may have some dementia.  [facility] is [facility] to [facility] to [facility].  AVS will be faxed to the facility.   Isla Pence, MD 07/23/20 1153

## 2020-07-23 NOTE — Progress Notes (Signed)
TOC CSW spoke with [place].  Pt has been admitted to [facility].  Currently awaiting AVS to be faxed.  CSW needs to contact Jolene Provost at facility for arrangements and faxing information to.  Drevion Offord Tarpley-Carter, MSW, LCSW-A Pronouns:  She, Her, Hers                  [place] ED Transitions of CareClinical Social Worker [email or web address] [phone]

## 2020-07-23 NOTE — Progress Notes (Signed)
TOC CSW spoke with [facility] in regards to pt.  Debbie provided CSW with admit information and this information has been shared with I-Li, Therapist, sports.  Room #:  [address], Bed B  Call Report#:  [phone], Ask for nurse on [place] Tarpley-Carter, MSW, LCSW-A Pronouns:  She, Her, Hers                  [place] ED Transitions of CareClinical Social Worker [email or web address] [phone]

## 2020-07-24 LAB — RESP PANEL BY RT-PCR (FLU A&B, COVID) ARPGX2
Influenza A by PCR: NEGATIVE
Influenza B by PCR: NEGATIVE
SARS Coronavirus 2 by RT PCR: NEGATIVE

## 2020-07-24 LAB — CBG MONITORING, ED: Glucose-Capillary: 230 mg/dL — ABNORMAL HIGH (ref 70–99)

## 2020-07-24 NOTE — ED Notes (Signed)
Assumed care of pt at this time. Pt resting in stretcher,sleeping,  no s/sx of acute distress at this time.  

## 2020-07-24 NOTE — Progress Notes (Signed)
TOC CSW attempted to contact Tara Jackson in an attempt to obtain room # and call report #.  CSW left HIPPA compliant message with my contact information.   Tara Jackson, MSW, LCSW-A Pronouns:  She, Her, [place] ED Transitions of CareClinical Social Worker [email or web address] [phone]

## 2020-07-24 NOTE — ED Provider Notes (Signed)
I spoke with the patient on rounds this morning.  She is resting comfortably in bed.  She has no complaints.  Insurance authorization is pending for SNF placement.   Arnaldo Natal, MD 07/24/20 [phone]

## 2020-07-24 NOTE — Progress Notes (Signed)
TOC CSW faxed information requested by [facility].  CSW will attempt to contact in regards to receipt of that information.    [place] Tarpley-Carter, MSW, LCSW-A Pronouns:  She, Her, Hers                  [place] ED Transitions of CareClinical Social Worker [email or web address] [phone]

## 2020-07-24 NOTE — Progress Notes (Signed)
TOC CSW received admission information from [facility].  This information has been shared with Barnetta Chapel, Therapist, sports.  Room #:  [address]  Call Report #:  [phone], Ask for [place] Tarpley-Carter, MSW, LCSW-A Pronouns:  She, Her, Hers                  [place] ED Transitions of CareClinical Social Worker [email or web address] [phone]

## 2020-07-24 NOTE — ED Notes (Addendum)
PTAR has been dispatched. Paper work in #24 bin.

## 2020-07-24 NOTE — ED Notes (Signed)
Attempted to call [facility] to speak to the RN. On hold for 5 minutes with no answer. [place] called back again and Tiffany at the front desk notified that transports would be called soon and for RN to call me back at [phone].

## 2020-07-24 NOTE — Progress Notes (Signed)
TOC CSW received a call from [facility].  Pt has been approved information provided will be listed below.  Start Date:  07/24/2020  Review Date:  07/29/2019  Reviewer:  Alexus  Phone #:  [phone]  Fax#:  [phone]  Approval Auth #:  [id number]  [place] Tarpley-Carter, MSW, LCSW-A Pronouns:  She, Her, [place] ED Transitions of CareClinical Social Worker [email or web address] [phone]

## 2020-07-24 NOTE — ED Notes (Signed)
Attempted to call report at [employer]. RN did not answer.

## 2020-07-28 ENCOUNTER — Telehealth: Payer: Self-pay

## 2020-07-28 NOTE — Telephone Encounter (Signed)
Received message to call [facility] with Dr. Keane Police office. Spoke with Caryl Pina who shared patient is having increased weakness with subsequent falls causing injury. Patient was recently in ED due to fall with injury of fx ankle. Patient was d/c to SNF but did not want to stay and requested husband to pick her up. Patient is at home now. PCP office requesting patient to be seen by Palliative care before 08/07/20 and requested assistance in setting up [place]. Will speak with PC team to provide update

## 2020-07-29 ENCOUNTER — Other Ambulatory Visit: Payer: Self-pay

## 2020-07-29 ENCOUNTER — Other Ambulatory Visit: Payer: Medicare HMO | Admitting: Nurse Practitioner

## 2020-07-29 DIAGNOSIS — Z515 Encounter for palliative care: Secondary | ICD-10-CM

## 2020-07-29 DIAGNOSIS — R531 Weakness: Secondary | ICD-10-CM

## 2020-07-29 DIAGNOSIS — M25572 Pain in left ankle and joints of left foot: Secondary | ICD-10-CM

## 2020-07-29 MED ORDER — TRAMADOL HCL 50 MG PO TABS: 50.0000 mg | ORAL_TABLET | Freq: Two times a day (BID) | ORAL | 0 refills | Status: DC | PRN

## 2020-07-29 NOTE — Progress Notes (Signed)
Tara Jackson Note Telephone: [phone]  Fax: [phone]    Date of encounter: 07/29/20 PATIENT NAME: Tara Jackson [address] [place] [address]   [phone] (home)  DOB: 10/21/1940 MRN: [id number]  PRIMARY CARE PROVIDER:    Shon Baton, MD,  [place] [address] [phone]  REFERRING PROVIDER:   Shon Jackson, [place],  [place] [address] [phone]  RESPONSIBLE PARTY:    Contact Information     Name Relation Home Work [place] [phone]  [phone]   Tara Jackson Sister [phone]     Tara Jackson [phone]       I met face to face with patient in home. Patient's husband Tara Jackson present during visit. Palliative Care was asked to follow this patient by consultation request of  Tara Baton, MD to address advance care planning and complex medical decision making. This is the initial visit.                                     ASSESSMENT AND PLAN / RECOMMENDATIONS:   Advance Care Planning/Goals of Care: Goals include to maximize quality of life and symptom management.  Visit consisted of building trust and discussions on Palliative care medicine as specialized medical care for people living with serious illness, aimed at facilitating improved quality of life through symptoms relief, assisting with advance care planning and establishing goals of care. Family expressed appreciation for education provided on Palliative care and how it differs from Hospice service. Our advance care planning conversation included a discussion about:    The value and importance of advance care planning  Experiences with loved ones who have been seriously ill or have died  Exploration of personal, cultural or spiritual beliefs that might influence medical decisions  Exploration of goals of care in the event of a sudden injury or illness  Review and  updating or creation of an  advance directive document . Decision not to resuscitate or to de-escalate disease focused treatments due to poor prognosis. CODE STATUS: DNR Goals of care: Patient's goal of care is function. Patient verbalized desire to regain strength and be independent with her ADLs and be able to maneuver from place to place.  Directives: Patient decided to not be resuscitated in the event of cardiac or respiratory arrest. DNR form signed for patient today. Patient and her husband advised to place form in a readily accessible area in home for easy assess when needed.  Copy of DNR form uploaded to [facility] EMR.  Palliative care will continue to provide support to patient, family and medical team.  I spent 20 minutes providing this consultation. More than 50% of the time in this consultation was spent in counseling and care coordination. --------------------------------------------------------------------------------------------------  Symptom Management/Plan: Left ankle pain: report pain of 10/10 today not relieved with Tylenol ER 630m three times a day. Patient was on Tramadol 546mevery 12hr as need in the past, report good pain relieve with Tramadol. Reordered Tramadol 5063mvery 12hrs as needed today, quantity #20, no refill. tablets. Substance control database reviewed. Generalized weakness: Condition is ongoing and worsened since last fall a week ago on 07/21/2020. Patient's husband verbalized being overwhelmed with patient's care. We discussed need for patient to continue physical therapy either at home or in rehabilitation facility. Patient's husband said patient will do better in a  facility as he is struggling with caring for patient in home by himself, report inability to afford the cost of a paid personal caregiver. Patient verbalized interest in returning to different Rehab facility. Patient asked if she could be accepted in the facility near her home.  Plan: NP will  coordinate care with palliative care social work to seek placement in another Rehab facility, if unsuccessful with consider in home PT/OT. Provided general support and encouragement. Questions and concerns were addressed. Patient/ and her husband was encouraged to call with questions and/or concerns.   Meds ordered this encounter  Medications   traMADol (ULTRAM) 50 MG tablet    Sig: Take 1 tablet (50 mg total) by mouth every 12 (twelve) hours as needed for up to 20 doses.    Dispense:  20 tablet    Refill:  0    Follow up Palliative Care Visit: Palliative care will continue to follow for complex medical decision making, advance care planning, and clarification of goals. Return in 4-8 weeks or prn.  PPS: 40%  HOSPICE ELIGIBILITY/DIAGNOSIS: TBD  Chief Complaint: left ankle pain  History obtained from review of Epic EMR, discussion with [name] and her husband.  HISTORY OF PRESENT ILLNESS:  Tara Jackson is a 80 y.o. year old female with multiple medical problem including Type 2 diabetes, CKD, CAD, HF s/p pace maker pace maker,bilateral knee replacements. Patient report left ankle pain, report pain as 10/10. Described pain as constant ache in her left ankle non radiating. She is s/p hospitalization on 07/21/2020 for left ankle fracture from a mechanical fall sustained in her home. Patient was discharged to a rehab facility but left against medical advise the next day saying she was not receiving good care. Patient at home now, totally dependent on her frail husband for all of her ADLs except feeding. She report receiving physical therapy from [facility] agency before her last fall. Of note, patient has history of of frequent falls due to weakness of her lower extremities.    I reviewed available labs, medications, imaging, studies and related documents from the EMR.  Records reviewed and summarized above.   ROS Constitutional: denied fever, denied chills General: NAD EYES:  denies acute vision changes ENMT: denies dysphagia Cardiovascular: denies chest pain, denies DOE Pulmonary: denies cough, denies increased SOB Abdomen: endorses poor appetite, denies constipation, endorses continence of bowel GU: denies dysuria, endorses incontinence of urine MSK: endorsed weakness Skin: denies rashes or wounds Neurological: endorsed acute pain, denies insomnia Psych: Endorses positive mood Heme/lymph/immuno: denies bruises, abnormal bleeding  Physical Exam: Current and past weights: 239.58lb, Ht 62f7", BMI 37.59kg/m2 Constitutional: NAD General: frail appearing, morbidly obese EYES: anicteric sclera, no discharge  ENMT: intact hearing, oral mucous membranes moist CV: RRR, trace BLE edema Pulmonary: LCTA, no increased work of breathing, no cough, room air Abdomen: no ascites GU: deferred MSK: mild sarcopenia, moves all extremities, non-ambulatory Skin: warm and dry, no rashes or wounds on visible skin Neuro: generalized weakness, mild cognitive impairment Psych: non-anxious affect, A and O x 3 Hem/lymph/immuno: no widespread bruising  CURRENT PROBLEM LIST:  Patient Active Problem List   Diagnosis Date Noted   Anxiety and depression 04/10/2020   Hyperlipidemia 04/10/2020   Lower extremity weakness 04/09/2020   Dry mouth 08/12/2019   Chronic respiratory failure with hypoxia (HCC) 07/26/2018   Cough variant asthma vs uacs  07/25/2018   Syncope due to orthostatic hypotension 09/12/2017   Orthostatic dizziness 06/12/2017   Diabetic neuropathy (HCC)  12/13/2016   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   Acute renal failure superimposed on chronic kidney disease ([place]) 10/24/2016   Neurocardiogenic syncope 10/23/2016   Chronic anemia 10/11/2016   Pacemaker 04/26/2015   Chronic daily headache 04/07/2015   Symptomatic bradycardia 03/24/2015   Sinus node dysfunction (HCC) 03/24/2015   Chronic diastolic heart failure (HCC)    Diabetes mellitus type 2 in obese  ([place])    Normal coronary arteries 01/14/2015   Normal cardiac stress test 11/30/2014   Bradycardia 11/28/2014   Type 2 diabetes mellitus with renal manifestations ([place]) 11/28/2014   Morbid obesity ([place]) 11/28/2014   Chronic kidney disease, stage 3b ([place]) 11/28/2014   Memory loss 06/03/2014   AKI (acute kidney injury) ([place])    Chest pain 04/19/2014   Pleuritic chest pain 04/19/2014   Diabetes mellitus (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   PULMONARY EMBOLISM, HX OF 10/02/2009   Rhinitis 04/07/2009   Sleep apnea 07/09/2008   Hyperlipidemia with target LDL less than 130 05/20/2008   Essential hypertension 05/20/2008   PAST MEDICAL HISTORY:  Active Ambulatory Problems    Diagnosis Date Noted   Hyperlipidemia with target LDL less than 130 05/20/2008   Essential hypertension 05/20/2008   Rhinitis 04/07/2009   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   Sleep apnea 07/09/2008   PULMONARY EMBOLISM, HX OF 47/10/6281   Diastolic dysfunction, left ventricle    PVC's (premature ventricular contractions) 02/10/2013   Constipation due to pain medication 08/21/2013   Ankle edema 08/21/2013   Headache(784.0) 09/19/2013   Restless legs syndrome (RLS) 09/19/2013   Tremor of right hand 09/20/2013   Chest pain 04/19/2014   Pleuritic chest pain 04/19/2014   Diabetes mellitus ([place]) 04/19/2014   Edema of both legs 04/19/2014   Dyspnea    DOE (dyspnea on exertion)    AKI (acute kidney injury) ([place])    Memory loss 06/03/2014   Bradycardia 11/28/2014   Type 2 diabetes mellitus with renal manifestations ([place]) 11/28/2014   Morbid obesity ([place])  11/28/2014   Chronic kidney disease, stage 3b ([place]) 11/28/2014   Normal cardiac stress test 11/30/2014   Normal coronary arteries 01/14/2015   Symptomatic bradycardia 03/24/2015   Chronic diastolic heart failure (HCC)    Diabetes mellitus type 2 in obese (HCC)    Sinus node dysfunction ([place]) 03/24/2015   Chronic daily headache 04/07/2015   Pacemaker 04/26/2015   Chronic anemia 10/11/2016   Neurocardiogenic syncope 10/23/2016   Acute renal failure superimposed on chronic kidney disease ([place]) 10/24/2016   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   Diabetic neuropathy ([place]) 12/13/2016   Orthostatic dizziness 06/12/2017   Syncope due to orthostatic hypotension 09/12/2017   Cough variant asthma vs uacs  07/25/2018   Chronic respiratory failure with hypoxia ([place]) 07/26/2018   Dry mouth 08/12/2019   Lower extremity weakness 04/09/2020   Anxiety and depression 04/10/2020   Hyperlipidemia 04/10/2020   Resolved Ambulatory Problems    Diagnosis Date Noted   DYSPNEA -- worsening exertional shortness of breath 05/30/2008   Chest pain, exertional -- with shortness of Breath; 02/09/2012   CHF (congestive heart failure) ([place]) 11/28/2014  Past Medical History:  Diagnosis Date   Anemia    Anxiety    Arthritis    Asthma    Brittle bone disease    CKD (chronic kidney disease)    Complication of anesthesia    Coronary artery disease, non-occlusive 2014   Depression    Diabetes mellitus type 2, insulin dependent ([place])    Diabetic retinopathy ([place])    DVT (deep venous thrombosis) ([place])    Family history of adverse reaction to anesthesia    Glaucoma    H/O hiatal hernia    Hypertension associated with diabetes ([place])    Hypertensive retinopathy    Hypothyroidism    Kidney stones    Macular degeneration    On home oxygen therapy    Osteoporosis    Oxygen desaturation during sleep    Palpitations 35years ago   PE (pulmonary thromboembolism) ([place]) x 3, last one was ~ 2003   Pneumonia     Presence of permanent cardiac pacemaker    Restless legs syndrome    Spinal headache    Spinal stenosis    Stroke ([place]) 04-19-2013   TIA (transient ischemic attack) March 2014   SOCIAL HX:  Social History   Tobacco Use   Smoking status: Passive Smoke Exposure - Never Smoker   Smokeless tobacco: Never  Substance Use Topics   Alcohol use: No    Alcohol/week: 0.0 standard drinks   FAMILY HX:  Family History  Problem Relation Age of Onset   Cancer Mother    Diabetes Mother    Cancer - Prostate Father    Diabetes Father    Retinal detachment Son    Diabetes Sister    Diabetes Brother    Diabetes Paternal Grandmother      ALLERGIES:  Allergies  Allergen Reactions   Iodine Other (See Comments)    ONLY IV--Makes unconscious   Iohexol Other (See Comments)     Code: SOB, Desc: cardiac arrest w/ iv contrast, has never used 13 hr prep//alice calhoun, Onset Date: 97948016    Metoclopramide Hcl Other (See Comments)    suicidal   Sulfa Antibiotics Hives   Lactose Intolerance (Gi) Diarrhea   Lansoprazole Nausea And Vomiting     PERTINENT MEDICATIONS:  Outpatient Encounter Medications as of 07/29/2020  Medication Sig   acetaminophen (TYLENOL) 325 MG tablet Take 2 tablets (650 mg total) by mouth every 6 (six) hours as needed for up to 30 doses for mild pain or moderate pain.   acetaminophen (TYLENOL) 650 MG CR tablet Take 1,300 mg by mouth every 8 (eight) hours as needed for pain (arthritis).   albuterol (VENTOLIN HFA) 108 (90 Base) MCG/ACT inhaler INHALE 2 INHALATIONS 3 TIMES A DAY AS NEEDED FOR COUGH/WHEEZE (Patient taking differently: Inhale 2 puffs into the lungs every 8 (eight) hours as needed for wheezing or shortness of breath.)   budesonide-formoterol (SYMBICORT) 80-4.5 MCG/ACT inhaler Inhale 2 puffs into the lungs 2 (two) times daily.   busPIRone (BUSPAR) 10 MG tablet Take 1 tablet (10 mg total) by mouth every morning.   Cholecalciferol (VITAMIN D-3) 25 MCG (1000 UT) CAPS Take  2,000 Units by mouth every morning.   clonazePAM (KLONOPIN) 0.5 MG tablet Take 0.5-1 mg by mouth See admin instructions. Takes 24m in am and 0.539min pm.   clopidogrel (PLAVIX) 75 MG tablet Take 75 mg by mouth every morning.   dicyclomine (BENTYL) 20 MG tablet Take 40 mg by mouth in the morning and at bedtime.  donepezil (ARICEPT) 10 MG tablet Take 1 tablet (10 mg total) by mouth at bedtime.   esomeprazole (NEXIUM) 40 MG capsule Take 40 mg by mouth daily.   furosemide (LASIX) 20 MG tablet Take 1 tablet (20 mg total) by mouth daily as needed for fluid or edema. As directed (Patient taking differently: Take 20 mg by mouth daily. As directed)   glipiZIDE (GLUCOTROL) 10 MG tablet Take 20 mg by mouth 2 (two) times daily before a meal.    guaiFENesin (MUCINEX) 600 MG 12 hr tablet Take 600 mg by mouth 2 (two) times daily as needed for cough.   insulin NPH (HUMULIN N,NOVOLIN N) 100 UNIT/ML injection Inject 5-20 Units into the skin See admin instructions. In the morning:  If cbg >300, then give 20 units. If cbg < or = 300, then give 15 unit At night: If cbg >250, then give 5 units. If cbg < or = 250, then give no insulin   insulin regular (NOVOLIN R,HUMULIN R) 100 units/mL injection Inject 0-30 Units into the skin See admin instructions. At breakfast: if cbg < 300, then give 25 units. If cbg > or = 300, then give 30 units. After dinner: goal cbgs is 150 and give insulin dose to reach this. For every 100 cbg over 150, then give 10 units of insulin.   levothyroxine (SYNTHROID) 100 MCG tablet Take 100 mcg by mouth daily before breakfast.   lidocaine (LIDODERM) 5 % Place 2 patches onto the skin daily as needed (pain). Remove & Discard patch within 12 hours or as directed by MD   linaclotide (LINZESS) 290 MCG CAPS capsule Take 290 mcg by mouth daily before breakfast.   Menthol-Methyl Salicylate (MUSCLE RUB) 10-15 % CREA Apply 1 application topically as needed for muscle pain.   nortriptyline (PAMELOR) 50 MG  capsule TAKE 2 CAPSULES (100MG) AT BEDTIME (Patient taking differently: Take 50 mg by mouth daily.)   OXYGEN Inhale 2 L into the lungs as needed. Used at overnight with a concentrator   oxymetazoline (AFRIN NASAL SPRAY) 0.05 % nasal spray Place 1 spray into both nostrils 2 (two) times daily as needed for congestion.   pantoprazole (PROTONIX) 40 MG tablet Take 1 tablet (40 mg total) by mouth daily. (Patient not taking: Reported on 07/22/2020)   polyethylene glycol (MIRALAX / GLYCOLAX) packet Take 17 g by mouth daily as needed for mild constipation.    potassium chloride SA (K-DUR,KLOR-CON) 20 MEQ tablet Take 1 tablet (20 mEq total) by mouth 2 (two) times daily.   rosuvastatin (CRESTOR) 20 MG tablet Take 20 mg by mouth daily.    solifenacin (VESICARE) 5 MG tablet Take 5 mg by mouth daily as needed (to prevent UTI).   terconazole (TERAZOL 7) 0.4 % vaginal cream Place 1 applicator vaginally daily as needed for itching.   traMADol (ULTRAM) 50 MG tablet Take 1 tablet (50 mg total) by mouth every 12 (twelve) hours as needed for up to 20 doses.   vitamin B-12 (CYANOCOBALAMIN) 1000 MCG tablet Take 1 tablet (1,000 mcg total) by mouth daily. (Patient not taking: Reported on 07/22/2020)   No facility-administered encounter medications on file as of 07/29/2020.   Thank you for the opportunity to participate in the care of Ms. Mahan.  The palliative care team will continue to follow. Please call our office at [phone] if we can be of additional assistance.   Jari Favre, DNP, AGPCNP-BC  COVID-19 PATIENT SCREENING TOOL Asked and negative response unless otherwise noted:   Have  you had symptoms of covid, tested positive or been in contact with someone with symptoms/positive test in the past 5-10 days?

## 2020-07-30 ENCOUNTER — Telehealth: Payer: Self-pay

## 2020-07-30 NOTE — Telephone Encounter (Signed)
At the direction of Loyal Gambler NP, order for PT eval and treat sent to [facility]

## 2020-07-31 ENCOUNTER — Telehealth: Payer: Self-pay | Admitting: Nurse Practitioner

## 2020-08-01 ENCOUNTER — Other Ambulatory Visit: Payer: Self-pay | Admitting: Medical

## 2020-08-07 ENCOUNTER — Other Ambulatory Visit: Payer: Self-pay

## 2020-08-07 ENCOUNTER — Other Ambulatory Visit: Payer: Medicare HMO | Admitting: Nurse Practitioner

## 2020-09-07 ENCOUNTER — Encounter: Payer: Self-pay | Admitting: Internal Medicine

## 2020-09-07 ENCOUNTER — Other Ambulatory Visit: Payer: Self-pay

## 2020-09-07 ENCOUNTER — Ambulatory Visit: Payer: Medicare HMO | Admitting: Internal Medicine

## 2020-09-07 DIAGNOSIS — J45991 Cough variant asthma: Secondary | ICD-10-CM | POA: Diagnosis not present

## 2020-09-07 NOTE — Patient Instructions (Signed)
Plan A = Automatic = Always=    symbicort 80 or dulera 100 Take 2 puffs first thing in am and then another 2 puffs about 12 hours later.    Work on inhaler technique:  relax and gently blow all the way out then take a nice smooth full deep breath back in, triggering the inhaler at same time you start breathing in.  Hold for up to 5 seconds if you can. Blow out thru nose. Rinse and gargle with water when done.  If mouth or throat bother you at all,  try brushing teeth/gums/tongue with arm and hammer toothpaste/ make a slurry and gargle and spit out.       Plan B = Backup (to supplement plan A, not to replace it) Only use your albuterol inhaler as a rescue medication to be used if you can't catch your breath by resting or doing a relaxed purse lip breathing pattern.  - The less you use it, the better it will work when you need it. - Ok to use the inhaler up to 2 puffs  every 4 hours if you must but call for appointment if use goes up over your usual need - Don't leave home without it !!  (think of it like the spare tire for your car)    Please schedule a follow up visit in 12 months but call sooner if needed

## 2020-09-07 NOTE — Progress Notes (Signed)
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 has been having alot of "colds"coug, sinus drainage since 06-11-2018 on average using rescue inh. 2 time a day 4 days a week.  Dyspnea:  Better p albuterol / better p prednisone  Cough: since may 4th worse than usual  assoc with pnds /clear  Mucus/ not noct but severe to  point of gag/vomit Sleep: on side bed is flat/ 2 pillows variably cough/sob  SABA use: some better albuterol  Already tried 3 abx/ tessalon no better per office notes  Note flare off gerd rx which prev took per Dr Ewing Schlein  rec rec Plan A = Automatic = dulera 100 Take 2 puffs first thing in am and then another 2 puffs about 12 hours later - do not refill if not helping Work on inhaler technique:  Plan B = Backup Only use your albuterol inhaler as a rescue medication  Nexium  40 mg   Take  30-60 min before first meal of the day and Pepcid (famotidine)  20 mg one after supper  until return to office - this is the best way to tell whether stomach acid is contributing to your problem.   GERD diet      09/28/18  History of Present Illness: Dyspnea:  Much better while on dulera / less need albuterol / worse since ran out of sample and can't afford dulera on her plan Cough: more hoarse with sense of PNDS  but no real cough  Sleeping: bothered by nose running  SABA use: twice daily  02: 2lpm hs  rec Plan A = Automatic =start symbicort 80 Take 2 puffs first thing in am and then another 2  puffs about 12 hours later.  And take Prednisone 10 mg take  4 each am x 2 days,   2 each am x 2 days,  1 each am x 2 days and stop   Work on inhaler technique:  Plan B = Backup Only use your albuterol inhaler as a rescue medication     11/02/2018  Acute post ER f/u ov  ov/Tara Jackson re: cough variant asthma+/-  uacs Chief Complaint  Patient presents with   Sinus drainage, out of breath    Does not feel any better since the hospital visit on 10/30/18 for sinus infection.  Dyspnea:  Room to room holding onto wall - poor balance and strength Cough: assoc with rhinorrhea, watery, some pnds no resp to atrovent ns  Sleeping: bed is flat disturbed by nasal symptoms > cough wheeze or sob    SABA use: none at all  02: 2lpm hs sometimes  rec For drainage / throat tickle try take CHLORPHENIRAMINE  4 mg  (Chlortab 4mg   at [employer] should be easiest to find in the green box)  take one every 4 hours as needed - available over the counter- may cause drowsiness so start  with just a bedtime dose or two and see how you tolerate it before trying in daytime   Prednisone 10 mg take  4 each am x 2 days,   2 each am x 2 days,  1 each am x 2 days and stop  Plan A = Automatic = Always=    Symbicort 80 Take 2 puffs first thing in am and then another 2 puffs about 12 hours later.  Work on inhaler technique:   Plan B = Backup (to supplement plan A, not to replace it)   Televisit 12/04/18 recs Prednisone 10 mg  2 each am until 100% better then one daily  X one week and then one half daily until seen  For drainage / throat tickle try take CHLORPHENIRAMINE  4 mg  (Chlortab 4mg   at [employer] should be easiest to find in the green box)  take one every 4 hours as needed -     12/18/2018  f/u ov/Tara Jackson re: cough variant asthma  Vs uacs  improves on pred only and can't use hfa (exhales ever time)  Chief Complaint  Patient presents with   Follow-up    Breathing has improved some.   Dyspnea:  Room to room   Baseline holds on to wall / balance and strength issues  Cough: not much still hoarse  Sleeping: bed is flat / two pillows baseline SABA uses too much and not effective technique  02: 2lpm sometimes  rec When breathing is bad start back at Prednisone 10 mg  2 each am until 100% better then one daily  X one week and then one half daily threfore  For drainage / throat tickle try take CHLORPHENIRAMINE  4 mg  New maintenance therapy = Automatic = Always = nebulizer with budesonide and performist first thing in am and 12 hours later.   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   Dyspnea:  Limited by balance not sob  Cough: minimal/ some clear nasal drainage  Sleeping: flat bed / 2 pillows  SABA use: minimal  02: 2lpm prn minimal use  Rec Let the nebulizer medicine medication go out thru your nose and use baking soda based toothpaste after use to reduce your mouth irritation  Ceiling on prednisone(10 mg)  is 20 mg and floor is 5 mg every day    09/07/2020  f/u ov/Tara Jackson re:  cough variant asthma maint on symbicort 80 2bid  Chief Complaint  Patient presents with   Follow-up    Breathing is doing well. She states not using her o2 or her albuterol inhaler.    Dyspnea:  room to room  Cough: better  Sleeping: on side/ bed is flat  SABA use: none now  02: none  Covid status:   vax x 4   No obvious day to day or daytime variability or assoc excess/ purulent sputum or mucus plugs or hemoptysis or cp or chest tightness, subjective wheeze or overt sinus or hb symptoms.   Sleeping as above  without nocturnal  or early am exacerbation  of respiratory  c/o's or need for noct saba. Also denies any obvious fluctuation of symptoms with weather or environmental changes or other aggravating or alleviating factors except as outlined above   No unusual  exposure hx or h/o childhood pna/ asthma  or knowledge of premature birth.  Current Allergies, Complete Past Medical History, Past Surgical History, Family History, and Social History were reviewed in [employer] [facility] record.  ROS  The following are not active complaints unless bolded Hoarseness, sore throat, dysphagia, dental problems, itching, sneezing,  nasal congestion or discharge of excess mucus or purulent secretions, ear ache,   fever, chills, sweats, unintended wt loss or wt gain, classically pleuritic or exertional cp,  orthopnea pnd or arm/hand swelling  or leg swelling, presyncope, palpitations, abdominal pain, anorexia, nausea, vomiting, diarrhea  or change in bowel habits or change in bladder habits, change in stools or change in urine, dysuria, hematuria,  rash, arthralgias, visual complaints, headache, numbness, weakness or ataxia or problems with walking or coordination,  change in mood or  memory.        Current Meds  Medication Sig   acetaminophen (TYLENOL) 325 MG tablet Take 2 tablets (650 mg total) by mouth every 6 (six) hours as needed for up to 30 doses for mild pain or moderate pain.   albuterol (VENTOLIN HFA) 108 (90 Base) MCG/ACT inhaler INHALE 2 INHALATIONS 3 TIMES A DAY AS NEEDED FOR COUGH/WHEEZE (Patient taking differently: Inhale 2 puffs into the lungs every 8 (eight) hours as needed for wheezing or shortness of breath.)   budesonide-formoterol (SYMBICORT) 80-4.5 MCG/ACT inhaler Inhale 2 puffs into the lungs 2 (two) times daily.   busPIRone (BUSPAR) 10 MG tablet Take 1 tablet (10 mg total) by mouth every morning.   Cholecalciferol (VITAMIN D-3) 25 MCG (1000 UT) CAPS Take 2,000 Units by mouth every morning.   clonazePAM (KLONOPIN) 0.5 MG tablet Take 0.5-1 mg by mouth See admin instructions. Takes 1mg  in am and 0.5mg  in pm.   clopidogrel (PLAVIX) 75 MG tablet Take 75 mg by mouth every morning.   dicyclomine (BENTYL) 20 MG tablet Take 40 mg by mouth in the  morning and at bedtime.   esomeprazole (NEXIUM) 40 MG capsule Take 40 mg by mouth daily.   furosemide (LASIX) 20 MG tablet Take 1 tablet (20 mg total) by mouth daily as needed for fluid or edema. As directed (Patient taking differently: Take 20 mg by mouth daily. As directed)   glipiZIDE (GLUCOTROL) 10 MG tablet Take 20 mg by mouth 2 (two) times daily before a meal.    guaiFENesin (MUCINEX) 600 MG 12 hr tablet Take 600 mg by mouth 2 (two) times daily as needed for cough.   insulin NPH (HUMULIN N,NOVOLIN N) 100 UNIT/ML injection Inject 5-20 Units into the skin See admin instructions. In the morning:  If cbg >300, then give 20 units. If cbg < or = 300, then give 15 unit At night: If cbg >250, then give 5 units. If cbg < or = 250, then give no insulin   insulin regular (NOVOLIN R,HUMULIN R) 100 units/mL injection Inject 0-30 Units into the skin See admin instructions. At breakfast: if cbg < 300, then give 25 units. If cbg > or = 300, then give 30 units. After dinner: goal cbgs is 150 and give insulin dose to reach this. For every 100 cbg over 150, then give 10 units of insulin.   levothyroxine (SYNTHROID) 100 MCG tablet Take 100 mcg by mouth daily before breakfast.   lidocaine (LIDODERM) 5 % Place 2 patches onto the skin daily as needed (pain). Remove & Discard patch within 12 hours or as directed by MD   linaclotide (LINZESS) 290 MCG CAPS capsule Take 290 mcg by mouth daily  before breakfast.   Menthol-Methyl Salicylate (MUSCLE RUB) 10-15 % CREA Apply 1 application topically as needed for muscle pain.   nortriptyline (PAMELOR) 50 MG capsule TAKE 2 CAPSULES (100MG ) AT BEDTIME (Patient taking differently: Take 50 mg by mouth daily.)   OXYGEN Inhale 2 L into the lungs as needed. Used at overnight with a concentrator   pantoprazole (PROTONIX) 40 MG tablet TAKE 1 TABLET BY MOUTH EVERY DAY   potassium chloride SA (K-DUR,KLOR-CON) 20 MEQ tablet Take 1 tablet (20 mEq total) by mouth 2 (two) times daily.    rosuvastatin (CRESTOR) 20 MG tablet Take 20 mg by mouth daily.    solifenacin (VESICARE) 5 MG tablet Take 5 mg by mouth daily as needed (to prevent UTI).   terconazole (TERAZOL 7) 0.4 % vaginal cream Place 1 applicator vaginally daily as needed for itching.   traMADol (ULTRAM) 50 MG tablet Take 1 tablet (50 mg total) by mouth every 12 (twelve) hours as needed for up to 20 doses.   vitamin B-12 (CYANOCOBALAMIN) 1000 MCG tablet Take 1 tablet (1,000 mcg total) by mouth daily.                Past Medical History:  Diagnosis Date   Anemia    Anxiety    Arthritis    Asthma    Brittle bone disease    Cataract    CKD (chronic kidney disease)    Complication of anesthesia    hard to wake up after anesthesia, trouble turning head   Coronary artery disease, non-occlusive 2014   a. minimal CAD by cath in 2014 with 40% RI stenosis. b. low-risk NST in 11/2014.   Depression    Diabetes mellitus type 2, insulin dependent (HCC)    Diabetic neuropathy (HCC)    Diastolic dysfunction, left ventricle 11/30/14   EF 55%, grade 1 DD   DVT (deep venous thrombosis) (HCC)    "18 in RLE; 7 LLE prior to PE" (03/24/2015)   Family history of adverse reaction to anesthesia    " the whole family is hard to wake up"   Glaucoma    H/O hiatal hernia    Hyperlipidemia    Hypertension associated with diabetes (HCC)    Hypothyroidism    Kidney stones    "passed them"   Memory loss 06/03/2014   On home oxygen therapy    "2L oxygen concentrator @ night"   Osteoporosis    Oxygen desaturation during sleep    wears 2 liters of oxygen at night    Palpitations 35years ago   Cardionet monitor - revealed mostly normal sinus rhythm, sinus bradycardia with first-degree A-V block heart rates mostly in the 50s and 60s with some 70s. No arrhythmias, PVCs or PACs noted.   PE (pulmonary thromboembolism) (HCC) x 3, last one was ~ 2003   history of recurrent RLE DVT with PE - last PE ~>13 yrs; Maintained on Plavix    Pneumonia    Presence of permanent cardiac pacemaker    Restless legs syndrome    Sleep apnea    cpap disontinued; test done 07/14/2008 ordered per  Byrum   Spinal headache    Spinal stenosis    L 3, L 4 and L 5, and C 1, C 2 and C3   Stroke (HCC) 04-19-2013   tia and stroke. 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 s/p [employer] PPM in 03/2015 by Dr. Ladona Ridgel secondary to sinus node dysfunction.  TIA (transient ischemic attack) March 2014        Objective:      01/15/2019       262  12/18/2018     259   11/02/18 265 lb 3.2 oz (120.3 kg)  10/30/18 258 lb 2.5 oz (117.1 kg)  07/25/18 258 lb 3.2 oz (117.1 kg)    Vital signs reviewed  09/07/2020  - Note at rest 02 sats  96% on RA   General appearance:    slt hoarse w/c bound elderly bf nad    Reports:Full dentures      HEENT : pt wearing mask not removed for exam due to covid -19 concerns.    NECK :  without JVD/Nodes/TM/ nl carotid upstrokes bilaterally   LUNGS: no acc muscle use,  Nl contour chest which is clear to A and P bilaterally without cough on insp or exp maneuvers   CV:  RRR  no s3 or murmur or increase in P2, and no edema   ABD:  soft and nontender with nl inspiratory excursion in the supine position. No bruits or organomegaly appreciated, bowel sounds nl  MS:  ext warm with boot L foot  calf tenderness, cyanosis or clubbing No obvious joint restrictions   SKIN: warm and dry without lesions    NEURO:  alert, approp, nl sensorium with  no motor or cerebellar deficits apparent.         Assessment

## 2020-09-07 NOTE — Assessment & Plan Note (Signed)
Onset spring 2019  - CT sinus 12/21/17 wnl (as seen on head ct) - Allergy profile 07/25/2018 >  Eos 0.1 /  IgE 15 RAST neg   - 07/25/2018   try dulera 100 2bid x 2 weeks > helped but could not afford it  - 11/02/2018  After extensive coaching inhaler device,  effectiveness =    75% but no consistently at this level > symbicort 80 2bid with samples  - 12/04/2018 Prednisone 10 mg  2 each am until 100% better then one daily  X one week and then one half daily until seen  - 12/18/2018  After extensive coaching inhaler device,  effectiveness =    0% (exhales repeatedly) > rec perfomist/ bud neb > 01/15/2019 improved but could not afford it - 09/07/2020  After extensive coaching inhaler device,  effectiveness =    75% > continue symb 80 2bid   All goals of chronic asthma control met including optimal function and elimination of symptoms with minimal need for rescue therapy.  Contingencies discussed in full including contacting this office immediately if not controlling the symptoms using the rule of two's.     F/u can be yearly, sooner if needed          Each maintenance medication was reviewed in detail including emphasizing most importantly the difference between maintenance and prns and under what circumstances the prns are to be triggered using an action plan format where appropriate.  Total time for H and P, chart review, counseling, reviewing hfa  device(s) and generating customized AVS unique to this office visit / same day charting = 21 min

## 2020-09-17 ENCOUNTER — Ambulatory Visit: Payer: Medicare HMO

## 2020-10-02 ENCOUNTER — Other Ambulatory Visit: Payer: Self-pay

## 2020-10-02 ENCOUNTER — Encounter (INDEPENDENT_AMBULATORY_CARE_PROVIDER_SITE_OTHER): Payer: Medicare HMO | Admitting: Ophthalmology

## 2020-10-02 ENCOUNTER — Encounter (INDEPENDENT_AMBULATORY_CARE_PROVIDER_SITE_OTHER): Payer: Self-pay | Admitting: Ophthalmology

## 2020-10-02 ENCOUNTER — Ambulatory Visit (INDEPENDENT_AMBULATORY_CARE_PROVIDER_SITE_OTHER): Payer: Medicare HMO | Admitting: Ophthalmology

## 2020-10-02 DIAGNOSIS — H3581 Retinal edema: Secondary | ICD-10-CM

## 2020-10-02 DIAGNOSIS — H353132 Nonexudative age-related macular degeneration, bilateral, intermediate dry stage: Secondary | ICD-10-CM | POA: Diagnosis not present

## 2020-10-02 DIAGNOSIS — H532 Diplopia: Secondary | ICD-10-CM

## 2020-10-02 DIAGNOSIS — H18513 Endothelial corneal dystrophy, bilateral: Secondary | ICD-10-CM

## 2020-10-02 DIAGNOSIS — H35723 Serous detachment of retinal pigment epithelium, bilateral: Secondary | ICD-10-CM

## 2020-10-02 DIAGNOSIS — I10 Essential (primary) hypertension: Secondary | ICD-10-CM | POA: Diagnosis not present

## 2020-10-02 DIAGNOSIS — E113293 Type 2 diabetes mellitus with mild nonproliferative diabetic retinopathy without macular edema, bilateral: Secondary | ICD-10-CM

## 2020-10-02 DIAGNOSIS — H35033 Hypertensive retinopathy, bilateral: Secondary | ICD-10-CM

## 2020-10-02 DIAGNOSIS — Z961 Presence of intraocular lens: Secondary | ICD-10-CM

## 2020-10-02 NOTE — Progress Notes (Signed)
[facility] Note  10/02/2020     CHIEF COMPLAINT Patient presents for Retina Follow Up   HISTORY OF PRESENT ILLNESS: Tara Jackson is a 80 y.o. female who presents to the clinic today for:   HPI     Retina Follow Up   Patient presents with  Other.  In both eyes.  This started 6 months ago.  I, the attending physician,  performed the HPI with the patient and updated documentation appropriately.        Comments   Patient here for 6 months retina follow up for NPDR OU, ARMD OU. Patient states vision about the same. Sees double at times. No eye pain. Has had some falls since last visit here.       Last edited by Bernarda Caffey, MD on 10/07/2020 11:07 AM.     pt states her health is not very good right now, she was in the hospital for a fall where she broke her ankle  Referring physician: Monna Fam, MD [place],  [place] [address]  HISTORICAL INFORMATION:   Selected notes from the MEDICAL RECORD NUMBER Referred by Dr. Monna Fam for concern of unusual large drusenoid, epi detachement LEE: 11.11.19 (K. Hecker) [BCVA: OD: 20/25++ OS: 20/20--] Ocular Hx-cataracts OU, glaucoma OU PMH-anemia, anxiety, arthritis, asthma, CKD, CAD, depression, DM(takes Novolin and glipizide), HLD, HTN,stroke    CURRENT MEDICATIONS: No current outpatient medications on file. (Ophthalmic Drugs)   No current facility-administered medications for this visit. (Ophthalmic Drugs)   Current Outpatient Medications (Other)  Medication Sig   acetaminophen (TYLENOL) 325 MG tablet Take 2 tablets (650 mg total) by mouth every 6 (six) hours as needed for up to 30 doses for mild pain or moderate pain.   albuterol (VENTOLIN HFA) 108 (90 Base) MCG/ACT inhaler INHALE 2 INHALATIONS 3 TIMES A DAY AS NEEDED FOR COUGH/WHEEZE (Patient taking differently: Inhale 2 puffs into the lungs every 8 (eight) hours as needed for wheezing or shortness of breath.)    budesonide-formoterol (SYMBICORT) 80-4.5 MCG/ACT inhaler Inhale 2 puffs into the lungs 2 (two) times daily.   busPIRone (BUSPAR) 10 MG tablet Take 1 tablet (10 mg total) by mouth every morning.   Cholecalciferol (VITAMIN D-3) 25 MCG (1000 UT) CAPS Take 2,000 Units by mouth every morning.   clonazePAM (KLONOPIN) 0.5 MG tablet Take 0.5-1 mg by mouth See admin instructions. Takes 1mg  in am and 0.5mg  in pm.   clopidogrel (PLAVIX) 75 MG tablet Take 75 mg by mouth every morning.   dicyclomine (BENTYL) 20 MG tablet Take 40 mg by mouth in the morning and at bedtime.   esomeprazole (NEXIUM) 40 MG capsule Take 40 mg by mouth daily.   furosemide (LASIX) 20 MG tablet Take 1 tablet (20 mg total) by mouth daily as needed for fluid or edema. As directed (Patient taking differently: Take 20 mg by mouth daily. As directed)   glipiZIDE (GLUCOTROL) 10 MG tablet Take 20 mg by mouth 2 (two) times daily before a meal.    guaiFENesin (MUCINEX) 600 MG 12 hr tablet Take 600 mg by mouth 2 (two) times daily as needed for cough.   insulin NPH (HUMULIN N,NOVOLIN N) 100 UNIT/ML injection Inject 5-20 Units into the skin See admin instructions. In the morning:  If cbg >300, then give 20 units. If cbg < or = 300, then give 15 unit At night: If cbg >250, then give 5 units. If cbg < or = 250, then  give no insulin   insulin regular (NOVOLIN R,HUMULIN R) 100 units/mL injection Inject 0-30 Units into the skin See admin instructions. At breakfast: if cbg < 300, then give 25 units. If cbg > or = 300, then give 30 units. After dinner: goal cbgs is 150 and give insulin dose to reach this. For every 100 cbg over 150, then give 10 units of insulin.   levothyroxine (SYNTHROID) 100 MCG tablet Take 100 mcg by mouth daily before breakfast.   lidocaine (LIDODERM) 5 % Place 2 patches onto the skin daily as needed (pain). Remove & Discard patch within 12 hours or as directed by MD   linaclotide (LINZESS) 290 MCG CAPS capsule Take 290 mcg by mouth  daily before breakfast.   Menthol-Methyl Salicylate (MUSCLE RUB) 10-15 % CREA Apply 1 application topically as needed for muscle pain.   nortriptyline (PAMELOR) 50 MG capsule TAKE 2 CAPSULES (100MG ) AT BEDTIME (Patient taking differently: Take 50 mg by mouth daily.)   OXYGEN Inhale 2 L into the lungs as needed. Used at overnight with a concentrator   pantoprazole (PROTONIX) 40 MG tablet TAKE 1 TABLET BY MOUTH EVERY DAY   potassium chloride SA (K-DUR,KLOR-CON) 20 MEQ tablet Take 1 tablet (20 mEq total) by mouth 2 (two) times daily.   rosuvastatin (CRESTOR) 20 MG tablet Take 20 mg by mouth daily.    solifenacin (VESICARE) 5 MG tablet Take 5 mg by mouth daily as needed (to prevent UTI).   terconazole (TERAZOL 7) 0.4 % vaginal cream Place 1 applicator vaginally daily as needed for itching.   traMADol (ULTRAM) 50 MG tablet Take 1 tablet (50 mg total) by mouth every 12 (twelve) hours as needed for up to 20 doses.   vitamin B-12 (CYANOCOBALAMIN) 1000 MCG tablet Take 1 tablet (1,000 mcg total) by mouth daily.   No current facility-administered medications for this visit. (Other)    REVIEW OF SYSTEMS: ROS   Positive for: Neurological, Genitourinary, Endocrine, Cardiovascular, Eyes, Respiratory Negative for: Constitutional, Gastrointestinal, Skin, Musculoskeletal, HENT, Psychiatric, Allergic/Imm, Heme/Lymph Last edited by Theodore Demark, COA on 10/02/2020  1:10 PM.      ALLERGIES Allergies  Allergen Reactions   Iodine Other (See Comments)    ONLY IV--Makes unconscious   Iohexol Other (See Comments)     Code: SOB, Desc: cardiac arrest w/ iv contrast, has never used 13 hr prep//alice calhoun, Onset Date: [id number]    Metoclopramide Hcl Other (See Comments)    suicidal   Sulfa Antibiotics Hives   Lactose Intolerance (Gi) Diarrhea   Lansoprazole Nausea And Vomiting    PAST MEDICAL HISTORY Past Medical History:  Diagnosis Date   Anemia    Anxiety    Arthritis    Asthma    Brittle bone  disease    CKD (chronic kidney disease)    Complication of anesthesia    hard to wake up after anesthesia, trouble turning head   Coronary artery disease, non-occlusive 2014   a. minimal CAD by cath in 2014 with 40% RI stenosis. b. low-risk NST in 11/2014.   Depression    Diabetes mellitus type 2, insulin dependent (HCC)    Diabetic neuropathy (HCC)    Diabetic retinopathy ([place])    NPDR OU   Diastolic dysfunction, left ventricle 11/30/14   EF 55%, grade 1 DD   DVT (deep venous thrombosis) ([place])    "18 in RLE; 7 LLE prior to PE" (03/24/2015)   Family history of adverse reaction to anesthesia    "  the whole family is hard to wake up"   Glaucoma    H/O hiatal hernia    Hyperlipidemia    Hypertension associated with diabetes ([place])    Hypertensive retinopathy    OU   Hypothyroidism    Kidney stones    "passed them"   Macular degeneration    Exu OU   Memory loss 06/03/2014   On home oxygen therapy    "2L oxygen concentrator @ night"   Osteoporosis    Oxygen desaturation during sleep    wears 2 liters of oxygen at night    Palpitations 35years ago   Cardionet monitor - revealed mostly normal sinus rhythm, sinus bradycardia with first-degree A-V block heart rates mostly in the 50s and 60s with some 70s. No arrhythmias, PVCs or PACs noted.   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 s/p [employer] PPM in 03/2015 by Dr. Lovena Le secondary to sinus node dysfunction.    TIA (transient ischemic attack) March 2014   Past Surgical History:   Procedure Laterality Date   ABDOMINAL HYSTERECTOMY     APPENDECTOMY     BACK SURGERY     steroid inj   CATARACT EXTRACTION Bilateral    CATARACT EXTRACTION W/ INTRAOCULAR LENS  IMPLANT, BILATERAL Bilateral 2000s   CHOLECYSTECTOMY     EP IMPLANTABLE DEVICE N/A 03/24/2015   [facility] [employer] PPM, Dr. Lovena Le   ESOPHAGOGASTRODUODENOSCOPY  08/09/2011   Procedure: ESOPHAGOGASTRODUODENOSCOPY (EGD);  Surgeon: Jeryl Columbia, MD;  Location: [facility];  Service: Endoscopy;  Laterality: N/A;   ESOPHAGOGASTRODUODENOSCOPY (EGD) WITH PROPOFOL N/A 11/12/2013   Procedure: ESOPHAGOGASTRODUODENOSCOPY (EGD) WITH PROPOFOL;  Surgeon: Jeryl Columbia, MD;  Location: [facility];  Service: Endoscopy;  Laterality: N/A;   ESOPHAGOGASTRODUODENOSCOPY (EGD) WITH PROPOFOL N/A 05/05/2016   Procedure: ESOPHAGOGASTRODUODENOSCOPY (EGD) WITH PROPOFOL;  Surgeon: Clarene Essex, MD;  Location: [facility] ENDOSCOPY;  Service: Endoscopy;  Laterality: N/A;   EYE SURGERY Bilateral    Cat Sx OU   GLAUCOMA SURGERY Bilateral 2000s   "laser"   HOT HEMOSTASIS  08/09/2011   Procedure: HOT HEMOSTASIS (ARGON PLASMA COAGULATION/BICAP);  Surgeon: Jeryl Columbia, MD;  Location: [facility];  Service: Endoscopy;  Laterality: N/A;   HOT HEMOSTASIS N/A 11/12/2013   Procedure: HOT HEMOSTASIS (ARGON PLASMA COAGULATION/BICAP);  Surgeon: Jeryl Columbia, MD;  Location: [facility];  Service: Endoscopy;  Laterality: N/A;   INSERT / REPLACE / REMOVE PACEMAKER  03/24/2015   IRIDOTOMY / IRIDECTOMY Bilateral    JOINT REPLACEMENT     KNEE SURGERY Left done with 2nd left knee replacement   spacer bar placement   LEFT HEART CATHETERIZATION WITH CORONARY ANGIOGRAM N/A 02/14/2012   Procedure: LEFT HEART CATHETERIZATION WITH CORONARY ANGIOGRAM;  Surgeon: Leonie Man, MD;  Location: [facility] CATH LAB;  Service: Cardiovascular:: no evidence of obstructive coronary disease ,low EDP with normal EF   LEV DOPPLER  05/29/2012   RIGHT EXTREM.  NORMAL VENOUS DUPLEX   NM MYOCAR PERF WALL  MOTION  08/2019   EF 55 to 65%.  No ischemia or infarction.  LOW RISK:   PACEMAKER PLACEMENT     REVISION TOTAL KNEE ARTHROPLASTY Left    TONSILLECTOMY     TOTAL KNEE ARTHROPLASTY Bilateral    TRANSTHORACIC ECHOCARDIOGRAM  11/2017   11/2017: EF 60-65%. Gr1 DD. PAP ~33 mmHg.  Mild TR. PPM leads in RA & RV.    TRIGGER FINGER RELEASE  11/22/2011   Procedure: RELEASE TRIGGER FINGER/A-1 PULLEY;  Surgeon: Meredith Pel, MD;  Location: [facility];  Service: Orthopedics;  Laterality: Left;  Left trigger thumb release   TRIGGER FINGER RELEASE Right yrs ago    FAMILY HISTORY Family History  Problem Relation Age of Onset   Cancer Mother    Diabetes Mother    Cancer - Prostate Father    Diabetes Father    Retinal detachment Son    Diabetes Sister    Diabetes Brother    Diabetes Paternal Grandmother     SOCIAL HISTORY Social History   Tobacco Use   Smoking status: Never    Passive exposure: Yes   Smokeless tobacco: Never  Vaping Use   Vaping Use: Never used  Substance Use Topics   Alcohol use: No    Alcohol/week: 0.0 standard drinks   Drug use: No         OPHTHALMIC EXAM:  Base Eye Exam     Visual Acuity (Snellen - Linear)       Right Left   Dist [place] 20/20 20/30 -2   Dist ph [place]  20/20 -2         Tonometry (Tonopen, 1:07 PM)       Right Left   Pressure 12 10         Pupils       Dark Light Shape React APD   Right 3 2 Round Brisk None   Left 3 2 Round Brisk None         Visual Fields (Counting fingers)       Left Right    Full Full         Extraocular Movement       Right Left    Full, Ortho Full, Ortho         Neuro/Psych     Oriented x3: Yes   Mood/Affect: Normal         Dilation     Both eyes: 1.0% Mydriacyl, 2.5% Phenylephrine @ 1:07 PM           Slit Lamp and Fundus Exam     Slit Lamp Exam       Right Left   Lids/Lashes Dermatochalasis - upper lid, Meibomian gland dysfunction Dermatochalasis - lower lid, Meibomian  gland dysfunction   Conjunctiva/Sclera mild melanosis White and quiet   Cornea arcus, 1+ PEE arcus, well healed temporal cataract wound, 1+PEE   Anterior Chamber Deep and quiet Deep and quiet   Iris round, no NVI, poorly dilated Round, mild atrophy inferotemporal, poorly dilated to 48mm   Lens PCIOL PC IOL   Vitreous Vitreous syneresis Vitreous syneresis         Fundus Exam       Right Left   Disc mild pallor, central cupping, Thin inferior rim Mild pallor, sharp rim, +cupping   C/D Ratio 0.7 0.6   Macula good foveal reflex, RPE motltling, clumping and atrophy, PED, no heme large PED nasal macula,  blunted foveal reflex, scattered MA, RPE mottling   Vessels  Vascular attenuation, mild tortuousity Vascular attenuation, mildly tortuous.   Periphery Attached, difficult view (poor dilation), No heme  Attached, difficult view (poor dilation), No heme             IMAGING AND PROCEDURES  Imaging and Procedures for @TODAY @  OCT, Retina - OU - Both Eyes       Right Eye Quality was good. Central Foveal Thickness: 211. Progression has been stable. Findings include normal foveal contour, no IRF, no SRF, pigment epithelial detachment, epiretinal membrane, retinal drusen , outer retinal atrophy, vitreomacular adhesion (stable).   Left Eye Quality was good. Central Foveal Thickness: 219. Progression has been stable. Findings include pigment epithelial detachment, normal foveal contour, no IRF, no SRF, retinal drusen , vitreomacular adhesion (No DME).   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 CME - cystoid macular edema. PED - pigment epithelial detachment. IRF - intraretinal fluid. SRF - subretinal fluid. EZ - ellipsoid zone. ERM - epiretinal membrane.  ORA - outer retinal atrophy. ORT - outer retinal tubulation. SRHM - subretinal hyper-reflective material            ASSESSMENT/PLAN:    ICD-10-CM   1. 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 Intermediate stage nonexudative age-related macular degeneration of both eyes  H35.3132     3. Retinal edema  H35.81 OCT, Retina - OU - Both Eyes    4. 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 Essential hypertension  I10     6. Hypertensive retinopathy of both eyes  H35.033     7. Pseudophakia of both eyes  Z96.1     8. Fuchs' corneal dystrophy of both eyes  H18.513     9. Diplopia  H53.2        1-3. 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 Mild nonproliferative diabetic retinopathy w/o DME, both eyes  - exam with scattered MA, IRH OU  - FA shows mild MA; no NV  - OCT without macular edema, both eyes  5,6. Hypertensive retinopathy OU  - discussed importance of tight BP control  - monitor  7. Pseudophakia OU  - s/p CE/IOL  - beautiful surgeries, doing well  - monitor  8. Fuch's endothelial dystrophy  - mild guttata OU, no edema  9. Horizontal binocular diplopia  - mild XT noted on exam  - managed by Dr. Herbert Deaner -- prism prescribed  - pt reports improvement in diplopia w/ glasses but states that  she doesn't wear them that  much   Ophthalmic Meds Ordered this visit:  No orders of the defined types were placed in this encounter.      Return in about 9 months (around 07/02/2021) for f/u non-exu ARMD OU, DFE, OCT.  There are no Patient Instructions on file for this visit.   This document serves as a record of services personally performed by Gardiner Sleeper, MD, PhD. It was created on their behalf by Orvan Falconer, an ophthalmic technician. The creation of this record is the provider's dictation and/or activities during the visit.    Electronically signed by: Orvan Falconer, OA, 10/07/20  11:09 AM   This document serves as a record of services personally performed by Gardiner Sleeper, MD, PhD. It was created on their behalf by San Jetty. Owens Shark, OA an ophthalmic technician. The creation of this record is the provider's dictation and/or activities during the visit.    Electronically signed by: San Jetty. Owens Shark, [place] 08.26.2022 11:09 AM  Gardiner Sleeper, M.D., Ph.D. Diseases & Surgery of the Retina and [place] 10/02/2020   I have reviewed the above documentation for accuracy and completeness, and I agree with the above. Gardiner Sleeper, M.D., Ph.D. 10/07/20 11:10 AM  Abbreviations: M myopia (nearsighted); A astigmatism; H hyperopia (farsighted); P presbyopia; Mrx spectacle prescription;  CTL contact lenses; OD right eye; OS left eye; OU both eyes  XT exotropia; ET esotropia; PEK punctate epithelial keratitis; PEE punctate epithelial erosions; DES dry eye syndrome; MGD meibomian gland dysfunction; ATs artificial tears; PFAT's preservative free artificial tears; [place] nuclear sclerotic cataract; PSC posterior subcapsular cataract; ERM epi-retinal membrane; PVD posterior vitreous detachment; RD retinal detachment; DM diabetes mellitus; DR diabetic retinopathy; NPDR non-proliferative diabetic retinopathy; PDR proliferative diabetic retinopathy; CSME clinically significant macular  edema; DME diabetic macular edema; dbh dot blot hemorrhages; CWS cotton wool spot; POAG primary open angle glaucoma; C/D cup-to-disc ratio; HVF humphrey visual field; GVF goldmann visual field; OCT optical coherence tomography; IOP intraocular pressure; BRVO Branch retinal vein occlusion; CRVO central retinal vein occlusion; CRAO central retinal artery occlusion; BRAO branch retinal artery occlusion; RT retinal tear; SB scleral buckle; PPV pars plana vitrectomy; VH Vitreous hemorrhage; PRP panretinal laser photocoagulation; IVK intravitreal kenalog; VMT vitreomacular traction; MH Macular hole;  NVD neovascularization of the disc; NVE neovascularization elsewhere; AREDS age related eye disease study; ARMD age related macular degeneration; POAG primary open angle glaucoma; EBMD epithelial/anterior basement membrane dystrophy; ACIOL anterior chamber intraocular lens; IOL intraocular lens; PCIOL posterior chamber intraocular lens; Phaco/IOL phacoemulsification with intraocular lens placement; [place] photorefractive keratectomy; LASIK laser assisted in situ keratomileusis; HTN hypertension; DM diabetes mellitus; COPD chronic obstructive pulmonary disease

## 2020-10-07 ENCOUNTER — Encounter (INDEPENDENT_AMBULATORY_CARE_PROVIDER_SITE_OTHER): Payer: Self-pay | Admitting: Ophthalmology

## 2020-10-16 ENCOUNTER — Other Ambulatory Visit: Payer: Self-pay | Admitting: Adult Health

## 2020-10-20 ENCOUNTER — Encounter: Payer: Self-pay | Admitting: *Deleted

## 2020-10-27 ENCOUNTER — Other Ambulatory Visit: Payer: Self-pay | Admitting: Internal Medicine

## 2020-11-01 LAB — CUP PACEART REMOTE DEVICE CHECK
Battery Remaining Longevity: 102 mo
Battery Remaining Percentage: 98 %
Brady Statistic RA Percent Paced: 85 %
Brady Statistic RV Percent Paced: 70 %
Date Time Interrogation Session: 20220811030100
Implantable Lead Implant Date: 20170214
Implantable Lead Implant Date: 20170214
Implantable Lead Location: 753859
Implantable Lead Location: 753860
Implantable Lead Model: 7740
Implantable Lead Model: 7741
Implantable Lead Serial Number: 649859
Implantable Lead Serial Number: 728741
Implantable Pulse Generator Implant Date: 20170214
Lead Channel Impedance Value: 655 Ohm
Lead Channel Impedance Value: 745 Ohm
Lead Channel Pacing Threshold Amplitude: 0.5 V
Lead Channel Pacing Threshold Amplitude: 1.4 V
Lead Channel Pacing Threshold Pulse Width: 0.4 ms
Lead Channel Pacing Threshold Pulse Width: 0.4 ms
Lead Channel Setting Pacing Amplitude: 1.9 V
Lead Channel Setting Pacing Amplitude: 2 V
Lead Channel Setting Pacing Pulse Width: 0.4 ms
Lead Channel Setting Sensing Sensitivity: 2.5 mV
Pulse Gen Serial Number: 718098

## 2020-11-17 ENCOUNTER — Encounter: Payer: Medicare HMO | Admitting: Internal Medicine

## 2020-11-24 ENCOUNTER — Ambulatory Visit: Payer: Medicare HMO | Admitting: Neurology

## 2020-11-24 VITALS — BP 145/79 | HR 60

## 2020-11-24 DIAGNOSIS — R269 Unspecified abnormalities of gait and mobility: Secondary | ICD-10-CM | POA: Diagnosis not present

## 2020-11-24 DIAGNOSIS — G6289 Other specified polyneuropathies: Secondary | ICD-10-CM

## 2020-11-24 DIAGNOSIS — R413 Other amnesia: Secondary | ICD-10-CM

## 2020-11-24 MED ORDER — NORTRIPTYLINE HCL 50 MG PO CAPS: 50.0000 mg | ORAL_CAPSULE | Freq: Two times a day (BID) | ORAL | 1 refills | Status: DC

## 2020-11-24 NOTE — Patient Instructions (Signed)
I had a long discussion with the patient and her husband regarding her longstanding gait difficulties which appear to be multifactorial due to underlying severe diabetic axonal neuropathy as well as degenerative lumbar spine disease and deconditioning.  Recommend she use a walker at all times and we discussed fall safety precautions.  I recommend we increase nortriptyline to 50 mg twice daily to help with her paresthesias.  We also discussed memory compensation strategies and encouraged her to increase participation in cognitively challenging activities like solving crossword puzzles, playing bridge and sodoku.  She will return for follow-up in the future in 3 months with my nurse practitioner Janett Billow or call earlier if necessary. Memory Compensation Strategies  Use "WARM" strategy.  W= write it down  A= associate it  R= repeat it  M= make a mental note  2.   You can keep a Social worker.  Use a 3-ring notebook with sections for the following: calendar, important names and phone numbers,  medications, doctors' names/phone numbers, lists/reminders, and a section to journal what you did  each day.   3.    Use a calendar to write appointments down.  4.    Write yourself a schedule for the day.  This can be placed on the calendar or in a separate section of the Memory Notebook.  Keeping a  regular schedule can help memory.  5.    Use medication organizer with sections for each day or morning/evening pills.  You may need help loading it  6.    Keep a basket, or pegboard by the door.  Place items that you need to take out with you in the basket or on the pegboard.  You may also want to  include a message board for reminders.  7.    Use sticky notes.  Place sticky notes with reminders in a place where the task is performed.  For example: " turn off the  stove" placed by the stove, "lock the door" placed on the door at eye level, " take your medications" on  the bathroom mirror or by the place  where you normally take your medications.  8.    Use alarms/timers.  Use while cooking to remind yourself to check on food or as a reminder to take your medicine, or as a  reminder to make a call, or as a reminder to perform another task, etc. Fall Prevention in the Home, Adult Falls can cause injuries and can happen to people of all ages. There are many things you can do to make your home safe and to help prevent falls. Ask for help when making these changes. What actions can I take to prevent falls? General Instructions Use good lighting in all rooms. Replace any light bulbs that burn out. Turn on the lights in dark areas. Use night-lights. Keep items that you use often in easy-to-reach places. Lower the shelves around your home if needed. Set up your furniture so you have a clear path. Avoid moving your furniture around. Do not have throw rugs or other things on the floor that can make you trip. Avoid walking on wet floors. If any of your floors are uneven, fix them. Add color or contrast paint or tape to clearly mark and help you see: Grab bars or handrails. First and last steps of staircases. Where the edge of each step is. If you use a stepladder: Make sure that it is fully opened. Do not climb a closed stepladder. Make sure the sides  of the stepladder are locked in place. Ask someone to hold the stepladder while you use it. Know where your pets are when moving through your home. What can I do in the bathroom?   Keep the floor dry. Clean up any water on the floor right away. Remove soap buildup in the tub or shower. Use nonskid mats or decals on the floor of the tub or shower. Attach bath mats securely with double-sided, nonslip rug tape. If you need to sit down in the shower, use a plastic, nonslip stool. Install grab bars by the toilet and in the tub and shower. Do not use towel bars as grab bars. What can I do in the bedroom? Make sure that you have a light by your bed that  is easy to reach. Do not use any sheets or blankets for your bed that hang to the floor. Have a firm chair with side arms that you can use for support when you get dressed. What can I do in the kitchen? Clean up any spills right away. If you need to reach something above you, use a step stool with a grab bar. Keep electrical cords out of the way. Do not use floor polish or wax that makes floors slippery. What can I do with my stairs? Do not leave any items on the stairs. Make sure that you have a light switch at the top and the bottom of the stairs. Make sure that there are handrails on both sides of the stairs. Fix handrails that are broken or loose. Install nonslip stair treads on all your stairs. Avoid having throw rugs at the top or bottom of the stairs. Choose a carpet that does not hide the edge of the steps on the stairs. Check carpeting to make sure that it is firmly attached to the stairs. Fix carpet that is loose or worn. What can I do on the outside of my home? Use bright outdoor lighting. Fix the edges of walkways and driveways and fix any cracks. Remove anything that might make you trip as you walk through a door, such as a raised step or threshold. Trim any bushes or trees on paths to your home. Check to see if handrails are loose or broken and that both sides of all steps have handrails. Install guardrails along the edges of any raised decks and porches. Clear paths of anything that can make you trip, such as tools or rocks. Have leaves, snow, or ice cleared regularly. Use sand or salt on paths during winter. Clean up any spills in your garage right away. This includes grease or oil spills. What other actions can I take? Wear shoes that: Have a low heel. Do not wear high heels. Have rubber bottoms. Feel good on your feet and fit well. Are closed at the toe. Do not wear open-toe sandals. Use tools that help you move around if needed. These  include: Canes. Walkers. Scooters. Crutches. Review your medicines with your doctor. Some medicines can make you feel dizzy. This can increase your chance of falling. Ask your doctor what else you can do to help prevent falls. Where to find more information Centers for Disease Control and Prevention, [employer]: http://www.wolf.info/ [employer]ging: http://kim-miller.com/ Contact a doctor if: You are afraid of falling at home. You feel weak, drowsy, or dizzy at home. You fall at home. Summary There are many simple things that you can do to make your home safe and to help prevent falls. Ways to make  your home safe include removing things that can make you trip and installing grab bars in the bathroom. Ask for help when making these changes in your home. This information is not intended to replace advice given to you by your health care provider. Make sure you discuss any questions you have with your health care provider. Document Revised: 08/28/2019 Document Reviewed: 08/28/2019 [employer] Patient Education  [place].

## 2020-11-24 NOTE — Progress Notes (Signed)
PATIENT: Tara Jackson DOB: Oct 03, 1940  REASON FOR VISIT: follow up- headache, memory loss HISTORY FROM: patient and husband  Chief Complaint  Patient presents with   New Patient (Initial Visit)    Rm 15, with husband,Worsening memory, possible CVA, worsening function, leg weakness, states she fall last Saturday      HISTORY OF PRESENT ILLNESS: Tara Jackson is a 80 year old female with a history of headaches and memory loss. She returns today for follow-up. She reports that nortriptyline has been beneficial or her headaches. Although she still has a headache daily. She states that the headache is always on the right side of the head. She does have photophobia but denies phonophobia. Occasionally will have nausea. She states that her headache can get severe and rates her pain as a 10 out of 10 on the pain scale. She uses Excedrin for her headaches. In the past Tylenol No. 3 has been provided however the patient is not clear if she is taking this or not. The patient reports that she has been taking one tablet of nortriptyline however the prescription was written for 2 tablets at bedtime. Patient denies any new symptoms. She returns today for an evaluation.  HISTORY 04/07/15: Tara Jackson is a 80 year old female with a history of headaches, TIA and memory loss. She returns today for follow-up. She reports that today she has a headache. She states that it started approximately 1 month ago and has been daily in nature. She rates her pain a 10 out of 10 today. She describes her discomfort as pulsatile in the temporal region. She does describe a stiff neck. Denies nausea and vomiting. Does have some photophobia. Denies phonophobia. She has tried tramadol 100 mg with no relief. In the past the patient has been on tizanidine (2014) and according to the notes she received good benefit from this medication. The patient does not recall this medication and is unsure why she is no longer taking it-- other than her  prescription may have ran out. The patient does feel that her memory is worse. She states that last night she did not remember eating supper. The patient is able to complete most ADLs independently. Although she requires assistance with putting on her shoes and stockings. Her husband also picks out her clothes for her. She no longer does much cooking because she feels dizzy at times. She does not operate a motor vehicle. She continues to do the finances however last month she had some complications however she is going to try again this month. The patient is not currently on any medication for her memory. Her BuSpar was increased at the last visit however the patient is not aware of this. Her husband is with her today at the visit. 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  HISTORY: Tara Jackson is a 80 year old African lady who developed new-onset chronic daily headaches for the last 4 weeks. She states that she had some dental extractions done of her upper molar teeth about a week ago following which she noticed worsening of her pain which predominantly is in the temporal head regions but does spread all over. She states the pain is constant, severe. At times it is pulsatile. She was seen in the emergency room at [facility] on 07/23/12. She was treated with IV Dilaudid, Phenergan, Zofran and Benadryl in no hopping on the subtotal benefit. CT scan of the maxillary region showed no evidence of abscess associated with  a total extraction but showed postoperative changes with communication between the oral cavity and nasal maxillary sinus. There was evidence of sinusitis seen. CT scan of the head showed no acute abnormality. The headache is accompanied by some nausea and light sensitivity and occasional vomiting. The headache remains unchanged throughout the day. She is currently being treated with antibiotic course for a swollen gums. She has tried oxycodone, Ultram and  Tylenol which have not helped. She does also have history of degenerative cervical spine disease she does complain of neck pain and muscle tightness and spasm as well. She has had 4 minor falls since her last visit 2 months ago. She even had a brief episode of blacking out yesterday but her husband caught her. She found that her blood pressure was low and her primary care physician has reduced her blood pressure medication since then. She denies any prior history of migraine headaches of similar headaches in the past. She denies any blurred vision or loss of vision accompanying her headaches. 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   UPDATE 11/15/12 (LL): Tara Jackson comes to office for revisit for daily headache after wisdom tooth extraction. MRI cervical spine shows prominent spondylitic changes mostly at C5-6 and C4-5. with lateral disc osteophyte protrusions resulting in moderate foraminal narrowing on the right and mild canal stenosis without definite compression. MRI brain showed mild changes of microvascular ischemia. Advanced changes of chronic paranasal sinusitis. She states that her headaches are much better, about 84 % better than last visit. She does not have a headache today. She is using Tizanadine 4 mg for neck muscle tension. She had a recent sleep study which showed snoring but AHI within normal limits. Restless legs syndrome was suggested due to frequent leg movements. She states at night sometimes she feels like something is crawling on her lower legs and she feels like she must move them.   UPDATE 09/19/13 (LL): Since last visit Tara Jackson states that she has had some problems with abdominal pain and constipation. She recently went to the ER with abdominal pain and constipation times one week, despite the use of several enemas. Her headaches have not been bad in the last few months. She had a  epidural pain injection in May at pain management. She denies any focal TIA or stroke symptoms, but has numerous symptom complaints on review of systems. She is concerned about leg swelling and abdominal swelling, although her weight is up another 21 pounds since last visit. She states she has been having frequent falls without injury, but when she falls it's difficult for her to get up by herself and even difficult for her husband to get her up. She does not exercise and uses a motorized wheelchair his most of the time. Of particular concern today is an increase in hand tremor right greater than left, which is present at rest but more intense with action. It is causing her embarrassment. Update 06/03/2014 : She returns for follow-up after last visit 6 months ago. She is accompanied by husband. Patient reports new complaint of memory difficulties for the last 3 months. She states that this is mainly short-term memory. She cannot remember recent events and conversations and needs to be reminded multiple times. She often asked the same questions when she was told to answer a short while back. At times she feels that she  has trouble completing sentences and can stop in mid sentence and search for what she was trying to say. She has had some episodes of hypoglycemia but feels her memory difficulties and are not related to that. She was prescribed primidone at last visit for tremor but she did not fill the prescription but she remains on Inderal 40 mg which seems to help her tremor quite well. She was hospitalized about 4-6 weeks ago with chest pain and all workup was negative except heart rate was slow. She has long-standing history of depression and does take BuSpar 7.5 mg daily as well as Klonopin but feels her depression is not adequately treated. She has had no recurrent stroke or TIA symptoms. She remains on clopidogrel which is tolerating well without significant bleeding or bruising. She states her blood pressure  and cholesterol control have been good Update 04/06/2015 : She returns for follow-up after last visit 6 months ago. She is accompanied by her husband. Patient states that in memory diminished slightly worse and she has trouble remembering some recent information. However she can still manage to take care of her daily activities by herself. She was recently hospitalized 2 weeks ago and had a pacemaker implanted by Dr. Lovena Le. She continues to have daily headaches but off and on and not constant. The headaches vary in severity from 6/10-10/10. She takes Tylenol which seems to help. On average needs to 3 tablets a day at least. She needs to take tramadol occasionally which doesn't work as well. She has been taking Pamelor 50 mg capsule at night for her remote history of anxiety attacks. She states her blood pressure is well controlled and today it is low at 103/61. She is having trouble controlling her sugars which have fluctuated a bit she has not had any recurrent stroke or TIA symptoms. She remains on Plavix which is tolerating well without bleeding or bruising.  Update 01/12/2016 : She returns for follow-up after last visit 9 months ago. She is accompanied by husband. Patient states that she had a minor fall about 3 weeks ago and slipped and her head hit a car door. Since then she's been having daily headaches. She describes this as mainly posterior headaches which are mild to moderate in nature. She had been asked to increase her dose of Pamelor to 100 mg at night and last visit but she has not done that. She also admits to having some cognitive difficulties with decreased attention and poor short-term memory and at times having trouble remembering names and completing sentences. She has a lifelong history of depression and the past has tried multiple medications including Zoloft and Wellbutrin and Paxil but currently she is only on Klonopin which she takes for anxiety. She does participate in some stress  relaxation activities like meditation daily but is not doing neck stretching exercises.She is on klonopin for anxiety but not on anything for depression. Update 12/01/2016 : she returns for follow-up after last visit 10 months ago. She is accompanied by her husband. Patient is complaining of increasing falls. She's fallen 3 times in gone to the emergency room and once she did not go She states she falls mostly backwards. She falls without warning. One fall she actually fell forwards.she has no warning and this happened suddenly and she cannot stop herself. She does not lose consciousness. She denies any chest pain and palpitations breaking out into sweats or blurred or loss of vision prior to his episodes. She was seen in the emergency room and  [id number] and CT scan of the head that showed no acute abnormality. The tingling numbness in her feet which is occasionally they as well. Denies any weakness in her feet. Complains of long-standing tremors in her hands occasionally. Past has taken propranolol which hashelped quite well.she denies any drooling of saliva, increased facial expressions short stepped gait. Or freezing while walking. Update 03/07/2017 : she returns for follow-up after last visit 3 months ago. She is accompanied by husband. She states she is doing well and she has had no further falls since her last visit with me. She did undergo EMG nerve conduction study done by Dr. 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 Carotid ultrasound done in Dr. Lianne Moris office showed no significant extra-cranial stenosis. Transcranial Doppler study showed low mean flow velocities throughout with increased pulsatility indexes likely due to diffuse intracranial atherosclerosis. Patient states that she does walk a little bit with a walker but needs her husband to be close to her so she does not fall. She does have some burning in her feet. She takes Pamelor at night as well as tramadol  when necessary as needed. She remains on Plavix which is tolerating well without bleeding or bruising. She states her blood sugars are under good control. Her blood pressure normally is quite good but today it is elevated at 151/78. Update 12/11/2017 ; she returns for follow-up after last visit 9 months ago.  She is accompanied by her husband.  Patient states that she has noted subjective worsening of her memory.  She has had trouble remembering names and more recently telephone numbers which were well-known to her.  She denies feeling depressed.  She has had no falls or injuries.  She denies any headaches.  She was having significant symptoms of orthostatic dizziness and Dr. Virgina Jock recently started her on Midodrin which seems to be helping.  She was seen today in his office and had lab work which showed significantly suppressed TSH of level 0.02.  She is on Synthroid 100 mg daily.  She was admitted in August 2019 with a fall to [facility].  She was discharged home with home physical therapy which she finished ~end of September.  She walks mostly endorsed using a walker but needs some help to get up and has to hold onto the husband until she feels better.  Her lab work from today shows serum creatinine of 2.0.  Hemoglobin A1c of 7.5 which actually is down from 7.9 from August.  She complains of diminished appetite and in fact has lost weight.  She has no family history of dementia.  Update 02/20/2018 : She returns for follow-up after last visit 2 months ago.  She is accompanied by her husband.  She continues to have memory difficulties and cognitive impairment which husband feels may have improved with the patient disagrees.  She had lab work for reversible causes of cognitive impairment done at her primary physician's office on 1//7/20 which was fairly unremarkable except for slightly elevated TSH.  She has seen a primary care physician and dose of Synthroid has been reduced to 75 mcg.Marland Kitchen  She was unable to  obtain an MRI due to pacemaker but had CT scan of the head done instead on 12/21/2017.wWhich showed mild generalized atrophy and no acute findings.  EEG done on 01/12/2018 was normal.  She has difficulty ambulating and uses a walker and a cane occasionally but mostly uses a scooter.  She is high risk for falls  but has had no major injuries.  On Mini-Mental status score testing in the office today she scored 19 out of 30 which is significant decline from 30 out of 30 from a year ago.  Update 12/27/2019 JM: Tara Jackson returns for medication refill of Aricept as she has not been seen since 02/2018.  At that time, evaluated by Dr. Leonie Man for complaints of memory and cognitive difficulties.  She has tolerated Aricept 10 mg nightly without side effects.  Subjectively, she reports worsening of her cognition per husband reports that has been stable.  MMSE today 19/30 (prior 19/30).  She does report ongoing depression/anxiety currently treated with buspirone, clonazepam twice daily and lorazepam as needed.  She is sedentary primarily transferring via wheelchair and will occasionally ambulate short distances.  She does not participate in any type of routine activity or exercise.  She returns today for evaluation.   Update 11/24/2020: She returns for follow-up after last visit 01/05/2020.  Patient had a fall in June and fractured her left ankle and had to wear a boot for several months.  Now she is able to bear some weight and walk without the boot.  She is finished home physical and occupational therapy.  She is able to walk a little bit with a walker but uses mostly a wheelchair for long distances.  She has had no further falls.  She was seen in the ER on 04/10/2020 for worsening gait and balance difficulties for a month.  CT scan of the head showed no acute abnormality and changes of small vessel disease and mild atrophy. CT scan of the cervical spine showed moderate to severe multilevel degenerative changes at C3-4 and  C5-6.  MRI cannot be obtained as she has a pacemaker.  Patient states her memory difficulties are about unchanged.  She did better today on the Mini-Mental status exam and scored 21/30 which was improvement from 19/30 from a year ago.  She continues to have some tingling numbness in her feet bilaterally which are worse.  At times she is unable to sleep at night because of this.  She takes nortriptyline 50 mg but only in the morning.  She has not tried  taking the nighttime dose yet.    REVIEW OF SYSTEMS: Out of a complete 14 system review of symptoms, the patient complains of those listed in HPI and all other systems negative    ALLERGIES: Allergies  Allergen Reactions   Iodine Other (See Comments)    ONLY IV--Makes unconscious   Iohexol Other (See Comments)     Code: SOB, Desc: cardiac arrest w/ iv contrast, has never used 13 hr prep//alice calhoun, Onset Date: [id number]    Metoclopramide Hcl Other (See Comments)    suicidal   Sulfa Antibiotics Hives   Lactose Intolerance (Gi) Diarrhea   Lansoprazole Nausea And Vomiting    HOME MEDICATIONS: Outpatient Medications Prior to Visit  Medication Sig Dispense Refill   acetaminophen (TYLENOL) 325 MG tablet Take 2 tablets (650 mg total) by mouth every 6 (six) hours as needed for up to 30 doses for mild pain or moderate pain. 30 tablet 0   albuterol (VENTOLIN HFA) 108 (90 Base) MCG/ACT inhaler INHALE 2 INHALATIONS 3 TIMES A DAY AS NEEDED FOR COUGH/WHEEZE (Patient taking differently: Inhale 2 puffs into the lungs every 8 (eight) hours as needed for wheezing or shortness of breath.) 18 each PRN   busPIRone (BUSPAR) 10 MG tablet Take 1 tablet (10 mg total) by mouth every morning. 60 tablet  1   Cholecalciferol (VITAMIN D-3) 25 MCG (1000 UT) CAPS Take 2,000 Units by mouth every morning.     clonazePAM (KLONOPIN) 0.5 MG tablet Take 0.5-1 mg by mouth See admin instructions. Takes 1mg  in am and 0.5mg  in pm.     clopidogrel (PLAVIX) 75 MG tablet Take 75  mg by mouth every morning.     dicyclomine (BENTYL) 20 MG tablet Take 40 mg by mouth in the morning and at bedtime.     donepezil (ARICEPT) 10 MG tablet TAKE 1 TABLET BY MOUTH EVERYDAY AT BEDTIME 90 tablet 0   esomeprazole (NEXIUM) 40 MG capsule Take 40 mg by mouth daily.     furosemide (LASIX) 20 MG tablet Take 1 tablet (20 mg total) by mouth daily as needed for fluid or edema. As directed (Patient taking differently: Take 20 mg by mouth daily. As directed) 90 tablet 2   glipiZIDE (GLUCOTROL) 10 MG tablet Take 20 mg by mouth 2 (two) times daily before a meal.      guaiFENesin (MUCINEX) 600 MG 12 hr tablet Take 600 mg by mouth 2 (two) times daily as needed for cough.     insulin NPH (HUMULIN N,NOVOLIN N) 100 UNIT/ML injection Inject 5-20 Units into the skin See admin instructions. In the morning:  If cbg >300, then give 20 units. If cbg < or = 300, then give 15 unit At night: If cbg >250, then give 5 units. If cbg < or = 250, then give no insulin     insulin regular (NOVOLIN R,HUMULIN R) 100 units/mL injection Inject 0-30 Units into the skin See admin instructions. At breakfast: if cbg < 300, then give 25 units. If cbg > or = 300, then give 30 units. After dinner: goal cbgs is 150 and give insulin dose to reach this. For every 100 cbg over 150, then give 10 units of insulin.     levothyroxine (SYNTHROID) 100 MCG tablet Take 100 mcg by mouth daily before breakfast.     lidocaine (LIDODERM) 5 % Place 2 patches onto the skin daily as needed (pain). Remove & Discard patch within 12 hours or as directed by MD     linaclotide (LINZESS) 290 MCG CAPS capsule Take 290 mcg by mouth daily before breakfast.     Menthol-Methyl Salicylate (MUSCLE RUB) 10-15 % CREA Apply 1 application topically as needed for muscle pain.     OXYGEN Inhale 2 L into the lungs as needed. Used at overnight with a concentrator     pantoprazole (PROTONIX) 40 MG tablet TAKE 1 TABLET BY MOUTH EVERY DAY 90 tablet 3   potassium chloride SA  (K-DUR,KLOR-CON) 20 MEQ tablet Take 1 tablet (20 mEq total) by mouth 2 (two) times daily. 10 tablet 0   rosuvastatin (CRESTOR) 20 MG tablet Take 20 mg by mouth daily.      solifenacin (VESICARE) 5 MG tablet Take 5 mg by mouth daily as needed (to prevent UTI).     SYMBICORT 80-4.5 MCG/ACT inhaler INHALE 2 PUFFS FIRST THING IN THE MORNING THEN ANOTHER 2 PUFFS ABOUT 12 HOURS LATER 30.6 each 4   terconazole (TERAZOL 7) 0.4 % vaginal cream Place 1 applicator vaginally daily as needed for itching.     traMADol (ULTRAM) 50 MG tablet Take 1 tablet (50 mg total) by mouth every 12 (twelve) hours as needed for up to 20 doses. 20 tablet 0   vitamin B-12 (CYANOCOBALAMIN) 1000 MCG tablet Take 1 tablet (1,000 mcg total) by mouth daily.  nortriptyline (PAMELOR) 50 MG capsule TAKE 2 CAPSULES (100MG ) AT BEDTIME (Patient taking differently: Take 50 mg by mouth daily.) 180 capsule 1   No facility-administered medications prior to visit.    PAST MEDICAL HISTORY: Past Medical History:  Diagnosis Date   Anemia    Anxiety    Arthritis    Asthma    Brittle bone disease    CKD (chronic kidney disease)    Complication of anesthesia    hard to wake up after anesthesia, trouble turning head   Coronary artery disease, non-occlusive 2014   a. minimal CAD by cath in 2014 with 40% RI stenosis. b. low-risk NST in 11/2014.   Depression    Diabetes mellitus type 2, insulin dependent (HCC)    Diabetic neuropathy (HCC)    Diabetic retinopathy ([place])    NPDR OU   Diastolic dysfunction, left ventricle 11/30/14   EF 55%, grade 1 DD   DVT (deep venous thrombosis) ([place])    "18 in RLE; 7 LLE prior to PE" (03/24/2015)   Family history of adverse reaction to anesthesia    " the whole family is hard to wake up"   Glaucoma    H/O hiatal hernia    Hyperlipidemia    Hypertension associated with diabetes ([place])    Hypertensive retinopathy    OU   Hypothyroidism    Kidney stones    "passed them"   Macular degeneration     Exu OU   Memory loss 06/03/2014   On home oxygen therapy    "2L oxygen concentrator @ night"   Osteoporosis    Oxygen desaturation during sleep    wears 2 liters of oxygen at night    Palpitations 35years ago   Cardionet monitor - revealed mostly normal sinus rhythm, sinus bradycardia with first-degree A-V block heart rates mostly in the 50s and 60s with some 70s. No arrhythmias, PVCs or PACs noted.   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 s/p [employer] PPM in 03/2015 by Dr. Lovena Le secondary to sinus node dysfunction.    TIA (transient ischemic attack) March 2014    PAST SURGICAL HISTORY: Past Surgical History:  Procedure Laterality Date   ABDOMINAL HYSTERECTOMY     APPENDECTOMY     BACK SURGERY     steroid inj   CATARACT EXTRACTION Bilateral    CATARACT EXTRACTION W/ INTRAOCULAR LENS  IMPLANT, BILATERAL Bilateral 2000s   CHOLECYSTECTOMY     EP IMPLANTABLE DEVICE N/A 03/24/2015   [facility] [employer] PPM, Dr. Lovena Le   ESOPHAGOGASTRODUODENOSCOPY  08/09/2011   Procedure: ESOPHAGOGASTRODUODENOSCOPY (EGD);  Surgeon: Jeryl Columbia, MD;  Location: [facility];  Service: Endoscopy;  Laterality: N/A;   ESOPHAGOGASTRODUODENOSCOPY (EGD) WITH PROPOFOL N/A 11/12/2013   Procedure: ESOPHAGOGASTRODUODENOSCOPY (EGD) WITH PROPOFOL;  Surgeon: Jeryl Columbia, MD;  Location: [facility] ENDOSCOPY;  Service: Endoscopy;  Laterality: N/A;   ESOPHAGOGASTRODUODENOSCOPY (EGD) WITH PROPOFOL N/A 05/05/2016  Procedure: ESOPHAGOGASTRODUODENOSCOPY (EGD) WITH PROPOFOL;  Surgeon: Clarene Essex, MD;  Location: [facility] ENDOSCOPY;  Service: Endoscopy;  Laterality: N/A;   EYE SURGERY Bilateral    Cat Sx OU   GLAUCOMA SURGERY Bilateral 2000s   "laser"   HOT HEMOSTASIS  08/09/2011   Procedure: HOT HEMOSTASIS (ARGON PLASMA COAGULATION/BICAP);  Surgeon: Jeryl Columbia, MD;  Location: [facility];  Service: Endoscopy;  Laterality: N/A;   HOT HEMOSTASIS N/A 11/12/2013   Procedure: HOT HEMOSTASIS (ARGON PLASMA COAGULATION/BICAP);  Surgeon: Jeryl Columbia, MD;  Location: [facility];  Service: Endoscopy;  Laterality: N/A;   INSERT / REPLACE / REMOVE PACEMAKER  03/24/2015   IRIDOTOMY / IRIDECTOMY Bilateral    JOINT REPLACEMENT     KNEE SURGERY Left done with 2nd left knee replacement   spacer bar placement   LEFT HEART CATHETERIZATION WITH CORONARY ANGIOGRAM N/A 02/14/2012   Procedure: LEFT HEART CATHETERIZATION WITH CORONARY ANGIOGRAM;  Surgeon: Leonie Man, MD;  Location: [facility] CATH LAB;  Service: Cardiovascular:: no evidence of obstructive coronary disease ,low EDP with normal EF   LEV DOPPLER  05/29/2012   RIGHT EXTREM. NORMAL VENOUS DUPLEX   NM MYOCAR PERF WALL MOTION  08/2019   EF 55 to 65%.  No ischemia or infarction.  LOW RISK:   PACEMAKER PLACEMENT     REVISION TOTAL KNEE ARTHROPLASTY Left    TONSILLECTOMY     TOTAL KNEE ARTHROPLASTY Bilateral    TRANSTHORACIC ECHOCARDIOGRAM  11/2017   11/2017: EF 60-65%. Gr1 DD. PAP ~33 mmHg.  Mild TR. PPM leads in RA & RV.    TRIGGER FINGER RELEASE  11/22/2011   Procedure: RELEASE TRIGGER FINGER/A-1 PULLEY;  Surgeon: Meredith Pel, MD;  Location: [place];  Service: Orthopedics;  Laterality: Left;  Left trigger thumb release   TRIGGER FINGER RELEASE Right yrs ago    FAMILY HISTORY: Family History  Problem Relation Age of Onset   Cancer Mother    Diabetes Mother    Cancer - Prostate Father    Diabetes Father    Retinal detachment Son    Diabetes Sister    Diabetes Brother    Diabetes Paternal Grandmother     SOCIAL HISTORY: Social  History   Socioeconomic History   Marital status: Married    Spouse name: james   Number of children: 2   Years of education: college   Highest education level: Not on file  Occupational History   Occupation: retired  Tobacco Use   Smoking status: Never    Passive exposure: Yes   Smokeless tobacco: Never  Vaping Use   Vaping Use: Never used  Substance and Sexual Activity   Alcohol use: No    Alcohol/week: 0.0 standard drinks   Drug use: No   Sexual activity: Yes  Other Topics Concern   Not on file  Social History Narrative   Married [name].) married 24 years.   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    Social Determinants of Health   Financial Resource Strain: Not on file  Food Insecurity: Not on file  Transportation Needs: Not on file  Physical Activity: Not on file  Stress: Not on file  Social Connections: Not on file  Intimate Partner Violence: Not on file      PHYSICAL EXAM  Vitals:   11/24/20 1019  BP: (!) 145/79  Pulse: 60   There is no height or weight on file to calculate BMI. MMSE -  Mini Mental State Exam 11/24/2020 12/26/2019 02/20/2018  Orientation to time 3 3 1   Orientation to Place 5 4 5   Registration 3 3 3   Attention/ Calculation 1 1 1   Recall 1 1 0  Language- name 2 objects 2 2 2   Language- repeat 1 1 1   Language- follow 3 step command 3 2 3   Language- read & follow direction 1 1 1   Write a sentence 1 1 1   Copy design 0 0 1  Total score 21 19 19     Generalized: obese elderly African-American lady, in no acute distress, sitting in a wheelchair. Neurological examination  Mentation: Alert oriented to time, place, history taking. Follows all commands speech and language fluent.  Mini-Mental status exam score 21/30 Cranial nerve II-XII: Pupils were equal round reactive to light. Extraocular movements were full, visual field were full on confrontational test. Facial sensation and strength were normal.  Uvula tongue midline. Head turning and shoulder shrug  were normal and symmetric. Motor: The motor testing reveals 5 over 5 strength LUE, LLE, RLE. 4/5 in RUE. Good symmetric motor tone is noted throughout.  Sensory: Diminished touch pinprick sensation in both lower extremities from below the knees.  Diminished position and vibration sensation from ankle down bilaterally.   Coordination: Cerebellar testing reveals good finger-nose-finger and heel-to-shin bilaterally.  Gait and station: Deferred as patient did not bring her walker. Reflexes: Deep tendon reflexes are symmetric and Depressed knee and ankle jerks bilaterally.    MMSE - Mini Mental State Exam 11/24/2020 12/26/2019 02/20/2018  Orientation to time 3 3 1   Orientation to Place 5 4 5   Registration 3 3 3   Attention/ Calculation 1 1 1   Recall 1 1 0  Language- name 2 objects 2 2 2   Language- repeat 1 1 1   Language- follow 3 step command 3 2 3   Language- read & follow direction 1 1 1   Write a sentence 1 1 1   Copy design 0 0 1  Total score 21 19 19        ASSESSMENT AND PLAN 80 y.o. year old female  has a past medical history of Anemia, Anxiety, Arthritis, Asthma, Brittle bone disease, CKD (chronic kidney disease), Complication of anesthesia, Coronary artery disease, non-occlusive (2014), Depression, Diabetes mellitus type 2, insulin dependent ([place]), Diabetic neuropathy ([place]), Diabetic retinopathy ([place]), Diastolic dysfunction, left ventricle (11/30/14), DVT (deep venous thrombosis) ([place]), Family history of adverse reaction to anesthesia, Glaucoma, H/O hiatal hernia, Hyperlipidemia, Hypertension associated with diabetes ([place]), Hypertensive retinopathy, Hypothyroidism, Kidney stones, Macular degeneration, Memory loss (06/03/2014), On home oxygen therapy, Osteoporosis, Oxygen desaturation during sleep, Palpitations (35years ago), 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  Patient has had increased difficulty with walking and frequent falls in the last 6 months likely due to combination of diabetic axonal polyneuropathy, degenerative lumbar spine disease and deconditioning.  She also has mild dementia and memory difficulties which appear stable.  I had a long discussion with the patient and her husband regarding her longstanding gait difficulties which appear to be multifactorial due to underlying severe diabetic axonal neuropathy as well as degenerative lumbar spine disease and deconditioning.  Recommend she use a walker at all times and we discussed fall safety precautions.  I recommend we increase nortriptyline to 50 mg twice daily to help with her nocturnal paresthesias.  And sleep we also discussed memory compensation strategies and encouraged her to increase participation in cognitively challenging activities like solving crossword puzzles, playing bridge and sodoku.  She will return for follow-up in the future in 3 months with my nurse practitioner Janett Billow or call earlier if necessary. I spent 30 minutes of face-to-face and non-face-to-face time with patient and husband.  This included previsit chart review, lab review, study review, order entry, electronic health record documentation, patient education and discussion regarding MCI vs early dementia, completion and review of MMSE, subjective memory worsening and answered all related questions, continuation of Aricept and answered all other questions to patient and husband satisfaction   Antony Contras , MD  [facility] [address] [place], [place] [address]  Phone [phone] Fax [phone] Note: This document was prepared with digital dictation and possible smart phrase technology. Any  transcriptional errors that result from this process are unintentional.

## 2020-11-30 NOTE — Progress Notes (Signed)
Electrophysiology Office Note Date: 12/01/2020  ID:  Tara, Jackson 08/09/1940, MRN [id number]  PCP: Shon Baton, MD Primary Cardiologist: Glenetta Hew, MD Electrophysiologist: Tara Peru, MD   CC: Pacemaker follow-up  Tara Jackson is a 80 y.o. female seen today for Tara Peru, MD for routine electrophysiology followup.  Since last being seen in our clinic the patient reports doing well overall. She feels like her device moves around freely in her chest. Sometimes feels like it's "down in her breast" or when she lies flat it "floats up to her shoulder".  she denies chest pain, palpitations, dyspnea, PND, orthopnea, nausea, vomiting, dizziness, syncope, edema, weight gain, or early satiety.  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 minimal CAD by cath in 2014 with 40% RI stenosis. b. low-risk NST in 11/2014.   Depression    Diabetes mellitus type 2, insulin dependent (HCC)    Diabetic neuropathy (HCC)    Diabetic retinopathy ([place])    NPDR OU   Diastolic dysfunction, left ventricle 11/30/14   EF 55%, grade 1 DD   DVT (deep venous thrombosis) ([place])    "18 in RLE; 7 LLE prior to PE" (03/24/2015)   Family history of adverse reaction to anesthesia    " the whole family is hard to wake up"   Glaucoma    H/O hiatal hernia    Hyperlipidemia    Hypertension associated with diabetes ([place])    Hypertensive retinopathy    OU   Hypothyroidism    Kidney stones    "passed them"   Macular degeneration    Exu OU   Memory loss 06/03/2014   On home oxygen therapy    "2L oxygen concentrator @ night"   Osteoporosis    Oxygen desaturation during sleep    wears 2  liters of oxygen at night    Palpitations 35years ago   Cardionet monitor - revealed mostly normal sinus rhythm, sinus bradycardia with first-degree A-V block heart rates mostly in the 50s and 60s with some 70s. No arrhythmias, PVCs or PACs noted.   PE (pulmonary thromboembolism) ([place]) x 3, last one was ~ 2003   history of recurrent RLE DVT with PE - last PE ~>13 yrs; Maintained on Plavix   Pneumonia    Presence of permanent cardiac pacemaker    Restless legs syndrome    Sleep apnea    cpap disontinued; test done 07/14/2008 ordered per  [place]rum   Spinal headache    Spinal stenosis    L 3, L 4 and L 5, and C 1, C 2 and C3   Stroke ([place]) 04-19-2013   tia and stroke. 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 s/p [employer] PPM in 03/2015 by Dr. Lovena Le secondary to sinus node dysfunction.    TIA (transient ischemic attack) March 2014   Past Surgical History:  Procedure Laterality Date   ABDOMINAL HYSTERECTOMY     APPENDECTOMY     BACK SURGERY     steroid inj   CATARACT EXTRACTION Bilateral    CATARACT EXTRACTION W/ INTRAOCULAR LENS  IMPLANT, BILATERAL Bilateral 2000s   CHOLECYSTECTOMY     EP IMPLANTABLE DEVICE N/A 03/24/2015   [facility] PPM, Dr. Lovena Le   ESOPHAGOGASTRODUODENOSCOPY  08/09/2011   Procedure: ESOPHAGOGASTRODUODENOSCOPY (EGD);  Surgeon: Tara Columbia, MD;  [facility];  Service: Endoscopy;  Laterality: N/A;   ESOPHAGOGASTRODUODENOSCOPY (EGD) WITH PROPOFOL N/A 11/12/2013   Procedure: ESOPHAGOGASTRODUODENOSCOPY (EGD) WITH PROPOFOL;  Surgeon: Tara Columbia, MD;  Location: [facility];  Service: Endoscopy;  Laterality: N/A;   ESOPHAGOGASTRODUODENOSCOPY (EGD) WITH PROPOFOL N/A 05/05/2016   Procedure: ESOPHAGOGASTRODUODENOSCOPY (EGD) WITH PROPOFOL;  Surgeon: Clarene Essex, MD;  Location: [facility] ENDOSCOPY;  Service: Endoscopy;  Laterality: N/A;   EYE SURGERY Bilateral    Cat Sx OU   GLAUCOMA SURGERY Bilateral 2000s   "laser"   HOT HEMOSTASIS   08/09/2011   Procedure: HOT HEMOSTASIS (ARGON PLASMA COAGULATION/BICAP);  Surgeon: Tara Columbia, MD;  [facility];  Service: Endoscopy;  Laterality: N/A;   HOT HEMOSTASIS N/A 11/12/2013   Procedure: HOT HEMOSTASIS (ARGON PLASMA COAGULATION/BICAP);  Surgeon: Tara Columbia, MD;  [facility];  Service: Endoscopy;  Laterality: N/A;   INSERT / REPLACE / REMOVE PACEMAKER  03/24/2015   IRIDOTOMY / IRIDECTOMY Bilateral    JOINT REPLACEMENT     KNEE SURGERY Left done with 2nd left knee replacement   spacer bar placement   LEFT HEART CATHETERIZATION WITH CORONARY ANGIOGRAM N/A 02/14/2012   Procedure: LEFT HEART CATHETERIZATION WITH CORONARY ANGIOGRAM;  Surgeon: Tara Man, MD;  Location: [facility] CATH LAB;  Service: Cardiovascular:: no evidence of obstructive coronary disease ,low EDP with normal EF   LEV DOPPLER  05/29/2012   RIGHT EXTREM. NORMAL VENOUS DUPLEX   NM MYOCAR PERF WALL MOTION  08/2019   EF 55 to 65%.  No ischemia or infarction.  LOW RISK:   PACEMAKER PLACEMENT     REVISION TOTAL KNEE ARTHROPLASTY Left    TONSILLECTOMY     TOTAL KNEE ARTHROPLASTY Bilateral    TRANSTHORACIC ECHOCARDIOGRAM  11/2017   11/2017: EF 60-65%. Gr1 DD. PAP ~33 mmHg.  Mild TR. PPM leads in RA & RV.    TRIGGER FINGER RELEASE  11/22/2011   Procedure: RELEASE TRIGGER FINGER/A-1 PULLEY;  Surgeon: Tara Pel, MD;  Location: [place];  Service: Orthopedics;  Laterality: Left;  Left trigger thumb release   TRIGGER FINGER RELEASE Right yrs ago    Current Outpatient Medications  Medication Sig Dispense Refill   acetaminophen (TYLENOL) 325 MG tablet Take 2 tablets (650 mg total) by mouth every 6 (six) hours as needed for up to 30 doses for mild pain or moderate pain. 30 tablet 0   albuterol (VENTOLIN HFA) 108 (90 Base) MCG/ACT inhaler INHALE 2 INHALATIONS 3 TIMES A DAY AS NEEDED FOR COUGH/WHEEZE 18 each PRN   busPIRone (BUSPAR) 10 MG tablet Take 1 tablet (10 mg total) by mouth every morning. 60 tablet  1   Cholecalciferol (VITAMIN D-3) 25 MCG (1000 UT) CAPS Take 2,000 Units by mouth every morning.     clonazePAM (KLONOPIN) 0.5 MG tablet Take 0.5-1 mg by mouth See admin instructions. Takes 1mg  in am and 0.5mg  in pm.     clopidogrel (PLAVIX) 75 MG tablet Take 75 mg by mouth every morning.     dicyclomine (BENTYL) 20 MG tablet Take 40 mg by mouth in the morning and at bedtime.     donepezil (ARICEPT) 10 MG tablet TAKE 1 TABLET BY MOUTH EVERYDAY AT BEDTIME 90 tablet 0   esomeprazole (NEXIUM) 40 MG  capsule Take 40 mg by mouth daily.     furosemide (LASIX) 20 MG tablet Take 1 tablet (20 mg total) by mouth daily as needed for fluid or edema. As directed 90 tablet 2   glipiZIDE (GLUCOTROL) 10 MG tablet Take 20 mg by mouth 2 (two) times daily before a meal.      guaiFENesin (MUCINEX) 600 MG 12 hr tablet Take 600 mg by mouth 2 (two) times daily as needed for cough.     insulin NPH (HUMULIN N,NOVOLIN N) 100 UNIT/ML injection Inject 5-20 Units into the skin See admin instructions. In the morning:  If cbg >300, then give 20 units. If cbg < or = 300, then give 15 unit At night: If cbg >250, then give 5 units. If cbg < or = 250, then give no insulin     insulin regular (NOVOLIN R,HUMULIN R) 100 units/mL injection Inject 0-30 Units into the skin See admin instructions. At breakfast: if cbg < 300, then give 25 units. If cbg > or = 300, then give 30 units. After dinner: goal cbgs is 150 and give insulin dose to reach this. For every 100 cbg over 150, then give 10 units of insulin.     levothyroxine (SYNTHROID) 100 MCG tablet Take 100 mcg by mouth daily before breakfast.     lidocaine (LIDODERM) 5 % Place 2 patches onto the skin daily as needed (pain). Remove & Discard patch within 12 hours or as directed by MD     linaclotide (LINZESS) 290 MCG CAPS capsule Take 290 mcg by mouth daily before breakfast.     Menthol-Methyl Salicylate (MUSCLE RUB) 10-15 % CREA Apply 1 application topically as needed for muscle pain.      nortriptyline (PAMELOR) 50 MG capsule Take 1 capsule (50 mg total) by mouth 2 (two) times daily. 180 capsule 1   OXYGEN Inhale 2 L into the lungs as needed. Used at overnight with a concentrator     pantoprazole (PROTONIX) 40 MG tablet TAKE 1 TABLET BY MOUTH EVERY DAY 90 tablet 3   potassium chloride SA (K-DUR,KLOR-CON) 20 MEQ tablet Take 1 tablet (20 mEq total) by mouth 2 (two) times daily. 10 tablet 0   rosuvastatin (CRESTOR) 20 MG tablet Take 20 mg by mouth daily.      solifenacin (VESICARE) 5 MG tablet Take 5 mg by mouth daily as needed (to prevent UTI).     SYMBICORT 80-4.5 MCG/ACT inhaler INHALE 2 PUFFS FIRST THING IN THE MORNING THEN ANOTHER 2 PUFFS ABOUT 12 HOURS LATER 30.6 each 4   terconazole (TERAZOL 7) 0.4 % vaginal cream Place 1 applicator vaginally daily as needed for itching.     traMADol (ULTRAM) 50 MG tablet Take 1 tablet (50 mg total) by mouth every 12 (twelve) hours as needed for up to 20 doses. 20 tablet 0   vitamin B-12 (CYANOCOBALAMIN) 1000 MCG tablet Take 1 tablet (1,000 mcg total) by mouth daily.     No current facility-administered medications for this visit.    Allergies:   Iodine, Iohexol, Metoclopramide hcl, Sulfa antibiotics, Lactose intolerance (gi), and Lansoprazole   Social History: Social History   Socioeconomic History   Marital status: Married    Spouse name: james   Number of children: 2   Years of education: college   Highest education level: Not on file  Occupational History   Occupation: retired  Tobacco Use   Smoking status: Never    Passive exposure: Yes   Smokeless tobacco: Never  Vaping Use   Vaping Use: Never used  Substance and Sexual Activity   Alcohol use: No    Alcohol/week: 0.0 standard drinks   Drug use: No   Sexual activity: Yes  Other Topics Concern   Not on file  Social History Narrative   Married [name].) married 31 years.   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    Social  Determinants of Health   Financial Resource Strain: Not on file  Food Insecurity: Not on file  Transportation Needs: Not on file  Physical Activity: Not on file  Stress: Not on file  Social Connections: Not on file  Intimate Partner Violence: Not on file    Family History: Family History  Problem Relation Age of Onset   Cancer Mother    Diabetes Mother    Cancer - Prostate Father    Diabetes Father    Retinal detachment Son    Diabetes Sister    Diabetes Brother    Diabetes Paternal Grandmother      Review of Systems: All other systems reviewed and are otherwise negative except as noted above.  Physical Exam: Vitals:   12/01/20 1021  BP: 130/70  Pulse: 60  SpO2: 98%  Weight: 242 lb (109.8 kg)  Height: 5' 7.5" (1.715 m)     GEN- The patient is well appearing, alert and oriented x 3 today.   HEENT: normocephalic, atraumatic; sclera clear, conjunctiva pink; hearing intact; oropharynx clear; neck supple  Lungs- Clear to ausculation bilaterally, normal work of breathing.  No wheezes, rales, rhonchi Heart- Regular rate and rhythm, no murmurs, rubs or gallops  GI- soft, non-tender, non-distended, bowel sounds present  Extremities- no clubbing or cyanosis. No edema MS- no significant deformity or atrophy Skin- warm and dry, no rash or lesion; PPM pocket well healed Psych- euthymic mood, full affect Neuro- strength and sensation are intact  PPM Interrogation- reviewed in detail today,  See PACEART report  EKG:  EKG is not ordered today. EKG 06/2020 NSR 64 bpm  Recent Labs: 04/09/2020: ALT 13 04/10/2020: TSH 1.238 04/11/2020: Magnesium 1.8 07/21/2020: BUN 20; Creatinine, Ser 1.52; Hemoglobin 11.6; Platelets 186; Potassium 4.7; Sodium 135   Wt Readings from Last 3 Encounters:  12/01/20 242 lb (109.8 kg)  07/21/20 240 lb (108.9 kg)  04/11/20 257 lb 0.9 oz (116.6 kg)    Other studies Reviewed: Additional studies/ records that were reviewed today include: Previous EP  office notes, Previous remote checks, Most recent labwork.   Assessment and Plan:  1. SND s/p [employer] PPM  Normal PPM function See Claudia Desanctis Art report No changes today  2. HTN Stable on current regimen   3. Obesity Body mass index is 37.34 kg/m.    Current medicines are reviewed at length with the patient today.    Labs/ tests ordered today include:  Orders Placed This Encounter  Procedures   DG Chest 2 View     Disposition:   Follow up with Dr. Lovena Le in 6 months. Sooner if CXR abnormal.     Signed, Shirley Friar, PA-C  12/01/2020 10:51 AM  [facility] [address] [place] [address] [phone] (office) [phone] (fax)

## 2020-12-01 ENCOUNTER — Encounter: Payer: Self-pay | Admitting: Student

## 2020-12-01 ENCOUNTER — Ambulatory Visit
Admission: RE | Admit: 2020-12-01 | Discharge: 2020-12-01 | Disposition: A | Discharge status: Home / Self Care | Payer: Medicare HMO | Source: Ambulatory Visit | Attending: Student | Admitting: Student

## 2020-12-01 ENCOUNTER — Other Ambulatory Visit: Payer: Self-pay

## 2020-12-01 ENCOUNTER — Ambulatory Visit: Payer: Medicare HMO | Admitting: Student

## 2020-12-01 VITALS — BP 130/70 | HR 60 | Ht 67.5 in | Wt 242.0 lb

## 2020-12-01 DIAGNOSIS — I495 Sick sinus syndrome: Secondary | ICD-10-CM

## 2020-12-01 DIAGNOSIS — I10 Essential (primary) hypertension: Secondary | ICD-10-CM

## 2020-12-01 LAB — CUP PACEART INCLINIC DEVICE CHECK
Date Time Interrogation Session: 20221025112907
Implantable Lead Implant Date: 20170214
Implantable Lead Implant Date: 20170214
Implantable Lead Location: 753859
Implantable Lead Location: 753860
Implantable Lead Model: 7740
Implantable Lead Model: 7741
Implantable Lead Serial Number: 649859
Implantable Lead Serial Number: 728741
Implantable Pulse Generator Implant Date: 20170214
Lead Channel Impedance Value: 718 Ohm
Lead Channel Impedance Value: 973 Ohm
Lead Channel Pacing Threshold Amplitude: 0.8 V
Lead Channel Pacing Threshold Amplitude: 1.6 V
Lead Channel Pacing Threshold Pulse Width: 0.4 ms
Lead Channel Pacing Threshold Pulse Width: 0.4 ms
Lead Channel Sensing Intrinsic Amplitude: 1.7 mV
Lead Channel Sensing Intrinsic Amplitude: 25 mV
Lead Channel Setting Pacing Amplitude: 2 V
Lead Channel Setting Pacing Amplitude: 2.5 V
Lead Channel Setting Pacing Pulse Width: 0.4 ms
Lead Channel Setting Sensing Sensitivity: 2.5 mV
Pulse Gen Serial Number: 718098

## 2020-12-01 NOTE — Patient Instructions (Signed)
Medication Instructions:  Your physician recommends that you continue on your current medications as directed. Please refer to the Current Medication list given to you today.  *If you need a refill on your cardiac medications before your next appointment, please call your pharmacy*   Lab Work: None If you have labs (blood work) drawn today and your tests are completely normal, you will receive your results only by: [place] (if you have MyChart) OR A paper copy in the mail If you have any lab test that is abnormal or we need to change your treatment, we will call you to review the results.   Testing/Procedures: Chest X-ray Instructions:    1. You may have this done at the [facility], located in the [place] on the 1st floor.    2. You do no have to have an appointment.    3. [place], [place] [address]        [phone]        Monday - Friday  8:00 am - 5:00 pm    Follow-Up: At [facility], you and your health needs are our priority.  As part of our continuing mission to provide you with exceptional heart care, we have created designated Provider Care Teams.  These Care Teams include your primary Cardiologist (physician) and Advanced Practice Providers (APPs -  Physician Assistants and Nurse Practitioners) who all work together to provide you with the care you need, when you need it.  We recommend signing up for the patient portal called "MyChart".  Sign up information is provided on this After Visit Summary.  MyChart is used to connect with patients for Virtual Visits (Telemedicine).  Patients are able to view lab/test results, encounter notes, upcoming appointments, etc.  Non-urgent messages can be sent to your provider as well.   To learn more about what you can do with MyChart, go to [email or web address].    Your next appointment:   6 month(s)  The format for your next appointment:   In Person  Provider:    You may see Cristopher Peru, MD or one of the following Advanced Practice Providers on your designated Care Team:   Tommye Standard, [place] "[facility]" [place], [place]

## 2020-12-17 ENCOUNTER — Ambulatory Visit (INDEPENDENT_AMBULATORY_CARE_PROVIDER_SITE_OTHER): Payer: Medicare HMO

## 2020-12-17 DIAGNOSIS — I495 Sick sinus syndrome: Secondary | ICD-10-CM | POA: Diagnosis not present

## 2020-12-22 ENCOUNTER — Ambulatory Visit: Payer: Medicare HMO | Admitting: Adult Health

## 2020-12-23 ENCOUNTER — Other Ambulatory Visit: Payer: Self-pay | Admitting: Internal Medicine

## 2020-12-24 LAB — CUP PACEART REMOTE DEVICE CHECK
Battery Remaining Longevity: 96 mo
Battery Remaining Percentage: 93 %
Brady Statistic RA Percent Paced: 98 %
Brady Statistic RV Percent Paced: 90 %
Date Time Interrogation Session: 20221110030000
Implantable Lead Implant Date: 20170214
Implantable Lead Implant Date: 20170214
Implantable Lead Location: 753859
Implantable Lead Location: 753860
Implantable Lead Model: 7740
Implantable Lead Model: 7741
Implantable Lead Serial Number: 649859
Implantable Lead Serial Number: 728741
Implantable Pulse Generator Implant Date: 20170214
Lead Channel Impedance Value: 692 Ohm
Lead Channel Impedance Value: 981 Ohm
Lead Channel Pacing Threshold Amplitude: 0.6 V
Lead Channel Pacing Threshold Amplitude: 1.9 V
Lead Channel Pacing Threshold Pulse Width: 0.4 ms
Lead Channel Pacing Threshold Pulse Width: 0.4 ms
Lead Channel Setting Pacing Amplitude: 2 V
Lead Channel Setting Pacing Amplitude: 2.5 V
Lead Channel Setting Pacing Pulse Width: 0.4 ms
Lead Channel Setting Sensing Sensitivity: 2.5 mV
Pulse Gen Serial Number: 718098

## 2020-12-28 NOTE — Progress Notes (Signed)
Remote pacemaker transmission.   

## 2021-01-16 ENCOUNTER — Other Ambulatory Visit: Payer: Self-pay | Admitting: Adult Health

## 2021-02-23 NOTE — Progress Notes (Addendum)
Primary Care Provider: Shon Baton, MD Cardiologist: Glenetta Hew, MD Electrophysiologist: Cristopher Peru, MD  Clinic Note: Chief Complaint  Patient presents with   Follow-up    Annual.  Noting fatigue.  Frequent falls.  Poor balance.   ===================================  ASSESSMENT/PLAN   Problem List Items Addressed This Visit       Cardiology Problems   Sick sinus syndrome s/p PPM (Chronic)   Relevant Medications   furosemide (LASIX) 20 MG tablet   Hyperlipidemia with target LDL less than 130 (Chronic)    HLD: Chronic, at goal  LDL goal < 130. Last LDL was 42 in March 2022.  -Continue Rosuvastatin 20 mg   Labs as of March 2022 look great. ->  No changes to meds      Relevant Medications   furosemide (LASIX) 20 MG tablet   Essential hypertension (Chronic)    HTN: Chronic, stable Normotensive today. Has history of hypotension, previously on midodrine.  -Off medications, monitor    We have gradually weaned off of all medications to allow for permissive hypertension due to orthostatic hypotension.  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  Not currently on midodrine, but maintaining stable blood pressures.  Only using diuretic PRN.      Relevant Medications   furosemide (LASIX) 20 MG tablet   Other Relevant Orders   EKG 12-Lead (Completed)   Venous stasis of both lower extremities with edema - Primary (Chronic)    Venous stasis:  -Off lasix, will restart PRN for edema -Encouraged SupportStockings and foot elevation.   She also needs to exercise -> talked about doing plantarflexion dorsiflexion exercises, the pedal bike will very much help for this  With her having orthostatic hypotension, trying to avoid diuretic.  Use only for PRN.      Relevant Medications   furosemide (LASIX) 20 MG tablet   Left ventricular diastolic dysfunction (Chronic)    Diastolic function noted on echo, but not really true diastolic heart failure.   Currently not on any medications other than as needed diuretic.  She has no real PND or orthopnea, and edema is probably not cardiac in nature.  Has had multiple different evaluations of her heart EF and ischemic evaluation that have been normal.      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 On insulin and glipizide.  -F/u with PCP       Cardiac pacemaker in situ (Chronic)    SSS s/p PPM Followed by EP. Last visit 12/01/2020, no changes were made. CXR obtained given patient concern about pacemaker moving around- seemed to be in stable position.    A sensed V paced rhythm on EKG.  This was placed because of a syncopal episode.  Functioning well.      OSA (obstructive sleep apnea) (Chronic)    Sleep apnea:  Sleeps with 2-3L [place] as needed.    Previously followed by Dr. Baltazar Apo; did not tolerate CPAP      Morbid obesity ([place]) (Chronic)    Extremely deconditioned, her sleep apnea, diabetes, hypertension and venous stasis all revolve around her being obese.  She is very reluctant to exercise.  We talked about using a pedal machine.  She really needs home health.  We talked about dietary modification.  She really would benefit from nutritional consultation, but is reluctant to do that as well.      Orthostatic dizziness (Chronic)    History of orthostatic hypotension   Falls  Recurrent falls, could be due to orthostasis vs. Deconditioning -Would likely benefit from home PT; patient declines today -Encouraged use of walker     100% agree with home PT.  She was adamant about not doing it.  We talked for quite a while about different types of exercise.  I recommended that she use a stationary pedal machine.  Provided a website with different options and different choices.  This allows her to sit in a chair or couch  and pedal the machine without having to leave early the house or do anything excessive.  She can be sitting comfortably.      ===================================  HPI:    Tara Jackson is a 81 y.o. female with a PMH below who presents today for annual follow-up.  [name] was last seen on January 21, 2020: She is doing well.  Had noted off and on edema, using occasional PRN additional Lasix.  Limited by knee and hip pain weakness.  Poor balance and fall tendency.  Occasional fast heart rate spells but no further syncope.  Heart rates were only into the 80s and 90s.  Not using CPAP using nightly O2.  Lots of musculoskeletal chest pain but not cardiac.  Noted poor energy level of fatigue. -> Plan was to allow for permissive hypertension.  Avoiding diuretics.  Was only on propranolol that has subsequently been stopped.  At that time she was on midodrine which has been stopped.  Rosuvastatin restarted.  Recent Hospitalizations: none   She was seen in October by Mr. Tillery from EP - no changes made.  Reviewed  CV studies:    The following studies were reviewed today: (if available, images/films reviewed: From Epic Chart or Care Everywhere) CXR 12/04/20: Stable positioning of pacemaker    Interval History:   [name] presents here today for follow-up along with her husband.  She was wheeled in a wheelchair. States that she has been "falling a lot". She feels it is related to her balance. She walks with a walker, but not all the time. States that she recently fractured her foot and was placed in a skilled nursing facility- states she only stated one night because "I thought they were trying to kill me". States they were giving her insulin without checking her sugars. She has been doing well at home, was receiving physical therapy. States "I'm not ready for physical therapy right now".   In regards, to her heart, she states that she believes her pacemaker is moving around.  States that she saw EP and they did an x-ray which looked fine. Checks BP at home, systolic pressures range 846-962.  Has chest pain "sometimes", sometimes it lasts for hours. Feels like central chest pressure. No SOB. Admits to foot swelling. Occasionally lightheaded/dizzy when getting up from sitting position.   CV Review of Symptoms (Summary) Cardiovascular ROS: positive for - chest pain, edema, and shortness of breath. Negative for syncope, pre-syncope  As usual, chest pain seems to be musculoskeletal in nature.  Last hours.  Very similar to her chronic pain.  REVIEWED OF SYSTEMS   Review of Systems  Constitutional:  Negative for fever.  Respiratory:  Positive for shortness of breath.   Cardiovascular:  Positive for chest pain and leg swelling.  Gastrointestinal:  Positive for  abdominal pain and diarrhea. Negative for nausea.  Genitourinary:  Negative for dysuria.  Neurological:  Positive for weakness and headaches.   I have reviewed and (if needed) personally updated the patient's problem list, medications, allergies, past medical and surgical history, social and family history.   PAST MEDICAL HISTORY   Past Medical History:  Diagnosis Date   Anemia    Anxiety    Arthritis    Asthma    Brittle bone disease    CKD (chronic kidney disease)    Complication of anesthesia    hard to wake up after anesthesia, trouble turning head   Coronary artery disease, non-occlusive 2014   a. minimal CAD by cath in 2014 with 40% RI stenosis. b. low-risk NST in 11/2014.   Depression    Diabetes mellitus type 2, insulin dependent (HCC)    Diabetic neuropathy (HCC)    Diabetic retinopathy ([place])    NPDR OU   Diastolic dysfunction, left ventricle 11/30/14   EF 55%, grade 1 DD   DVT (deep venous thrombosis) ([place])    "18 in RLE; 7 LLE prior to PE" (03/24/2015)   Family history of adverse reaction to anesthesia    " the whole family is hard to wake up"   Glaucoma    H/O hiatal hernia     Hyperlipidemia    Hypertension associated with diabetes ([place])    Hypertensive retinopathy    OU   Hypothyroidism    Kidney stones    "passed them"   Macular degeneration    Exu OU   Memory loss 06/03/2014   On home oxygen therapy    "2L oxygen concentrator @ night"   Osteoporosis    Oxygen desaturation during sleep    wears 2 liters of oxygen at night    Palpitations 35years ago   Cardionet monitor - revealed mostly normal sinus rhythm, sinus bradycardia with first-degree A-V block heart rates mostly in the 50s and 60s with some 70s. No arrhythmias, PVCs or PACs noted.   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 s/p [employer] PPM in 03/2015 by Dr. Lovena Le secondary to sinus node dysfunction.    TIA (transient ischemic attack) March 2014    PAST SURGICAL HISTORY   Past Surgical History:  Procedure Laterality Date   ABDOMINAL HYSTERECTOMY     APPENDECTOMY     BACK SURGERY     steroid inj   CATARACT EXTRACTION Bilateral    CATARACT EXTRACTION W/ INTRAOCULAR LENS  IMPLANT, BILATERAL Bilateral 2000s   CHOLECYSTECTOMY     EP IMPLANTABLE DEVICE N/A 03/24/2015   [facility] Scientific PPM, Dr. Lovena Le   ESOPHAGOGASTRODUODENOSCOPY  08/09/2011   Procedure: ESOPHAGOGASTRODUODENOSCOPY (EGD);  Surgeon: Jeryl Columbia, MD;  Location: [facility] ENDOSCOPY;  Service: Endoscopy;  Laterality: N/A;   ESOPHAGOGASTRODUODENOSCOPY (EGD) WITH PROPOFOL N/A 11/12/2013   Procedure: ESOPHAGOGASTRODUODENOSCOPY (EGD) WITH PROPOFOL;  Surgeon: Jeryl Columbia, MD;  Location: [facility]  ENDOSCOPY;  Service: Endoscopy;  Laterality: N/A;   ESOPHAGOGASTRODUODENOSCOPY (EGD) WITH PROPOFOL N/A 05/05/2016   Procedure: ESOPHAGOGASTRODUODENOSCOPY (EGD) WITH PROPOFOL;  Surgeon: Clarene Essex, MD;  Location: [facility] ENDOSCOPY;  Service: Endoscopy;  Laterality: N/A;   EYE SURGERY Bilateral    Cat Sx OU   GLAUCOMA SURGERY Bilateral 2000s   "laser"   HOT HEMOSTASIS  08/09/2011   Procedure: HOT HEMOSTASIS (ARGON PLASMA COAGULATION/BICAP);  Surgeon: Jeryl Columbia, MD;  Location: [facility] ENDOSCOPY;  Service: Endoscopy;  Laterality: N/A;   HOT HEMOSTASIS N/A 11/12/2013   Procedure: HOT HEMOSTASIS (ARGON PLASMA COAGULATION/BICAP);  Surgeon: Jeryl Columbia, MD;  Location: [facility] ENDOSCOPY;  Service: Endoscopy;  Laterality: N/A;   INSERT / REPLACE / REMOVE PACEMAKER  03/24/2015   IRIDOTOMY / IRIDECTOMY Bilateral    JOINT REPLACEMENT     KNEE SURGERY Left done with 2nd left knee replacement   spacer bar placement   LEFT HEART CATHETERIZATION WITH CORONARY ANGIOGRAM N/A 02/14/2012   Procedure: LEFT HEART CATHETERIZATION WITH CORONARY ANGIOGRAM;  Surgeon: Leonie Man, MD;  Location: [facility] CATH LAB;  Service: Cardiovascular:: no evidence of obstructive coronary disease ,low EDP with normal EF   LEV DOPPLER  05/29/2012   RIGHT EXTREM. NORMAL VENOUS DUPLEX   NM MYOCAR PERF WALL MOTION  08/2019   EF 55 to 65%.  No ischemia or infarction.  LOW RISK:   PACEMAKER PLACEMENT     REVISION TOTAL KNEE ARTHROPLASTY Left    TONSILLECTOMY     TOTAL KNEE ARTHROPLASTY Bilateral    TRANSTHORACIC ECHOCARDIOGRAM  11/2017   11/2017: EF 60-65%. Gr1 DD. PAP ~33 mmHg.  Mild TR. PPM leads in RA & RV.    TRIGGER FINGER RELEASE  11/22/2011   Procedure: RELEASE TRIGGER FINGER/A-1 PULLEY;  Surgeon: Meredith Pel, MD;  Location: [facility];  Service: Orthopedics;  Laterality: Left;  Left trigger thumb release   TRIGGER FINGER RELEASE Right yrs ago    Immunization History  Administered Date(s) Administered   Influenza Whole 10/22/2009    Influenza, High Dose Seasonal PF 10/24/2016, 11/07/2017, 10/22/2019   Influenza-Unspecified 12/09/2014, 11/08/2018   PFIZER(Purple Top)SARS-COV-2 Vaccination 03/12/2019, 04/02/2019, 11/21/2019   Pneumococcal Polysaccharide-23 02/07/2005    MEDICATIONS/ALLERGIES   Current Meds  Medication Sig   acetaminophen (TYLENOL) 325 MG tablet Take 2 tablets (650 mg total) by mouth every 6 (six) hours as needed for up to 30 doses for mild pain or moderate pain.   albuterol (VENTOLIN HFA) 108 (90 Base) MCG/ACT inhaler INHALE 2 INHALATIONS 3 TIMES A DAY AS NEEDED FOR COUGH/WHEEZE   busPIRone (BUSPAR) 10 MG tablet Take 1 tablet (10 mg total) by mouth every morning.   Cholecalciferol (VITAMIN D-3) 25 MCG (1000 UT) CAPS Take 2,000 Units by mouth every morning.   clonazePAM (KLONOPIN) 0.5 MG tablet Take 0.5-1 mg by mouth See admin instructions. Takes 1mg  in am and 0.5mg  in pm.   clopidogrel (PLAVIX) 75 MG tablet Take 75 mg by mouth every morning.   dicyclomine (BENTYL) 20 MG tablet Take 40 mg by mouth in the morning and at bedtime.   donepezil (ARICEPT) 10 MG tablet TAKE 1 TABLET BY MOUTH EVERYDAY AT BEDTIME   esomeprazole (NEXIUM) 40 MG capsule Take 40 mg by mouth daily.   furosemide (LASIX) 20 MG tablet Take 1 tablet (20 mg total) by mouth daily as needed. For swelling or shortness of breath   glipiZIDE (GLUCOTROL) 10 MG tablet Take 20 mg by mouth 2 (two) times daily before a  meal.    guaiFENesin (MUCINEX) 600 MG 12 hr tablet Take 600 mg by mouth 2 (two) times daily as needed for cough.   insulin NPH (HUMULIN N,NOVOLIN N) 100 UNIT/ML injection Inject 5-20 Units into the skin See admin instructions. In the morning:  If cbg >300, then give 20 units. If cbg < or = 300, then give 15 unit At night: If cbg >250, then give 5 units. If cbg < or = 250, then give no insulin   insulin regular (NOVOLIN R,HUMULIN R) 100 units/mL injection Inject 0-30 Units into the skin See admin instructions. At breakfast: if cbg < 300,  then give 25 units. If cbg > or = 300, then give 30 units. After dinner: goal cbgs is 150 and give insulin dose to reach this. For every 100 cbg over 150, then give 10 units of insulin.   levothyroxine (SYNTHROID) 100 MCG tablet Take 100 mcg by mouth daily before breakfast.   lidocaine (LIDODERM) 5 % Place 2 patches onto the skin daily as needed (pain). Remove & Discard patch within 12 hours or as directed by MD   linaclotide (LINZESS) 290 MCG CAPS capsule Take 290 mcg by mouth daily before breakfast.   Menthol-Methyl Salicylate (MUSCLE RUB) 10-15 % CREA Apply 1 application topically as needed for muscle pain.   nortriptyline (PAMELOR) 50 MG capsule Take 1 capsule (50 mg total) by mouth 2 (two) times daily.   OXYGEN Inhale 2 L into the lungs as needed. Used at overnight with a concentrator   pantoprazole (PROTONIX) 40 MG tablet TAKE 1 TABLET BY MOUTH EVERY DAY   potassium chloride SA (KLOR-CON M) 20 MEQ tablet Take 20 mEq by mouth 2 (two) times daily. Take 1/2 tablet twice daily.   rosuvastatin (CRESTOR) 20 MG tablet Take 20 mg by mouth daily.    solifenacin (VESICARE) 5 MG tablet Take 5 mg by mouth daily as needed (to prevent UTI).   SYMBICORT 80-4.5 MCG/ACT inhaler INHALE 2 PUFFS FIRST THING IN THE MORNING THEN ANOTHER 2 PUFFS ABOUT 12 HOURS LATER   terconazole (TERAZOL 7) 0.4 % vaginal cream Place 1 applicator vaginally daily as needed for itching.   traMADol (ULTRAM) 50 MG tablet Take 1 tablet (50 mg total) by mouth every 12 (twelve) hours as needed for up to 20 doses.    Allergies  Allergen Reactions   Iodine Other (See Comments)    ONLY IV--Makes unconscious   Iohexol Other (See Comments)     Code: SOB, Desc: cardiac arrest w/ iv contrast, has never used 13 hr prep//alice calhoun, Onset Date: [id number]    Metoclopramide Hcl Other (See Comments)    suicidal   Sulfa Antibiotics Hives   Lactose Intolerance (Gi) Diarrhea   Lansoprazole Nausea And Vomiting    SOCIAL HISTORY/FAMILY  HISTORY   Reviewed in Epic:  Pertinent findings:  Social History   Tobacco Use   Smoking status: Never    Passive exposure: Yes   Smokeless tobacco: Never  Vaping Use   Vaping Use: Never used  Substance Use Topics   Alcohol use: No    Alcohol/week: 0.0 standard drinks   Drug use: No   Social History   Social History Narrative   Married [name].) married 10 years.   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     OBJCTIVE -PE, EKG, labs   Wt Readings from Last 3 Encounters:  02/24/21 227 lb (103 kg)  12/01/20 242 lb (  109.8 kg)  07/21/20 240 lb (108.9 kg)    Physical Exam: BP 136/71    Pulse 62    Ht 5' 7.75" (1.721 m)    Wt 227 lb (103 kg)    SpO2 100%    BMI 34.77 kg/m  Physical Exam Constitutional:      Appearance: She is obese. She is not ill-appearing.  HENT:     Head: Normocephalic.  Eyes:     Conjunctiva/sclera: Conjunctivae normal.  Cardiovascular:     Rate and Rhythm: Normal rate.     Pulses: Normal pulses.     Comments: Trace non-pitting edema b/l legs  Pulmonary:     Effort: Pulmonary effort is normal. No respiratory distress.     Breath sounds: Normal breath sounds.  Abdominal:     Palpations: Abdomen is soft.     Tenderness: There is no abdominal tenderness.  Musculoskeletal:     Cervical back: Normal range of motion and neck supple.  Skin:    General: Skin is warm and dry.  Neurological:     General: No focal deficit present.     Mental Status: She is alert. Mental status is at baseline.  Psychiatric:        Mood and Affect: Mood normal.        Behavior: Behavior normal.  In a wheelchair.  Seems chronically ill and morbidly obese.  Very sedentary.  Reluctant to walk.  As usual, she is well groomed.    Adult ECG Report  Rate: 62 ;  Rhythm:  Atrial sensed ventricular pacing ;   Narrative Interpretation: Paced rhythm, prolonged PR interval  - Personally Reviewed & Signed  Recent Labs:    Lab Results   Component Value Date   CHOL 119 04/10/2020   HDL 67 04/10/2020   LDLCALC 38 04/10/2020   TRIG 71 04/10/2020   CHOLHDL 1.8 04/10/2020   Lab Results  Component Value Date   CREATININE 1.52 (H) 07/21/2020   BUN 20 07/21/2020   NA 135 07/21/2020   K 4.7 07/21/2020   CL 99 07/21/2020   CO2 26 07/21/2020   CBC Latest Ref Rng & Units 07/21/2020 06/16/2020 04/11/2020  WBC 4.0 - 10.5 K/uL 5.9 11.4(H) 5.0  Hemoglobin 12.0 - 15.0 g/dL 11.6(L) 12.3 11.2(L)  Hematocrit 36.0 - 46.0 % 37.0 39.6 35.3(L)  Platelets 150 - 400 K/uL 186 228 210    Lab Results  Component Value Date   HGBA1C 8.4 (H) 04/10/2020   Lab Results  Component Value Date   TSH 1.238 04/10/2020    =================================================  COVID-19 Education: The signs and symptoms of COVID-19 were discussed with the patient and how to seek care for testing (follow up with PCP or arrange E-visit).    Time spent in direct patient contact/consultation by resident: 15  Time spent in direct patient contact/consultation by attending: 5  Resident charting:10 min  Additional time spent with chart review  / charting (studies, outside notes, etc): 12 min Total Time: 42 min  This visit occurred during the SARS-CoV-2 public health emergency.  Safety protocols were in place, including screening questions prior to the visit, additional usage of staff PPE, and extensive cleaning of exam room while observing appropriate contact time as indicated for disinfecting solutions.  Notice: This dictation was prepared with Dragon dictation along with smart phrase technology. Any transcriptional errors that result from this process are unintentional and may not be corrected upon review.  Studies Ordered:   Orders Placed This Encounter  Procedures   EKG 12-Lead    Patient Instructions / Medication Changes & Studies & Tests Ordered   Patient Instructions  Medication Instructions:   No changes  Lasix 20 mg - may take a tablet  as needed daily  for swelling or shortness of breath  *If you need a refill on your cardiac medications before your next appointment, please call your pharmacy*   Lab Work: Not needed    Testing/Procedures:  Not needed  Follow-Up: At [facility], you and your health needs are our priority.  As part of our continuing mission to provide you with exceptional heart care, we have created designated Provider Care Teams.  These Care Teams include your primary Cardiologist (physician) and Advanced Practice Providers (APPs -  Physician Assistants and Nurse Practitioners) who all work together to provide you with the care you need, when you need it.     Your next appointment:   12 month(s)  The format for your next appointment:   In Person  Provider:   Glenetta Hew, MD      Signed.  Sharion Settler PGY-2 Family Medicine    ATTENDING ATTESTATION  I have seen, examined and evaluated the patient in clinic along with Dr. Nita Sells.  After reviewing all the available data and chart, we discussed the patients laboratory, study & physical findings as well as symptoms in detail. I agree with her findings, examination as well as impression recommendations as per our discussion.    Attending adjustments noted in italics.   Overall relatively stable.  No changes made.  We talked a lot about trying to increase her level of exercise.  Gave her several options.  Thankfully, blood pressures are much better now no longer on midodrine-also no longer on propranolol.  Switching to as needed Lasix.      Glenetta Hew, M.D., M.S. Interventional Cardiologist   Pager # [phone] Phone # [phone] [address] [place], [place] [address]      Thank you for choosing Heartcare at [facility]!!

## 2021-02-24 ENCOUNTER — Encounter: Payer: Self-pay | Admitting: Cardiology

## 2021-02-24 ENCOUNTER — Other Ambulatory Visit: Payer: Self-pay

## 2021-02-24 ENCOUNTER — Ambulatory Visit: Payer: Medicare HMO | Admitting: Cardiology

## 2021-02-24 VITALS — BP 136/71 | HR 62 | Ht 67.75 in | Wt 227.0 lb

## 2021-02-24 DIAGNOSIS — I878 Other specified disorders of veins: Secondary | ICD-10-CM | POA: Diagnosis not present

## 2021-02-24 DIAGNOSIS — E1129 Type 2 diabetes mellitus with other diabetic kidney complication: Secondary | ICD-10-CM

## 2021-02-24 DIAGNOSIS — Z950 Presence of cardiac pacemaker: Secondary | ICD-10-CM

## 2021-02-24 DIAGNOSIS — I5032 Chronic diastolic (congestive) heart failure: Secondary | ICD-10-CM

## 2021-02-24 DIAGNOSIS — I519 Heart disease, unspecified: Secondary | ICD-10-CM

## 2021-02-24 DIAGNOSIS — I10 Essential (primary) hypertension: Secondary | ICD-10-CM

## 2021-02-24 DIAGNOSIS — E785 Hyperlipidemia, unspecified: Secondary | ICD-10-CM

## 2021-02-24 DIAGNOSIS — I495 Sick sinus syndrome: Secondary | ICD-10-CM | POA: Diagnosis not present

## 2021-02-24 DIAGNOSIS — G4733 Obstructive sleep apnea (adult) (pediatric): Secondary | ICD-10-CM

## 2021-02-24 DIAGNOSIS — R42 Dizziness and giddiness: Secondary | ICD-10-CM

## 2021-02-24 DIAGNOSIS — R600 Localized edema: Secondary | ICD-10-CM

## 2021-02-24 MED ORDER — FUROSEMIDE 20 MG PO TABS: 20.0000 mg | ORAL_TABLET | Freq: Every day | ORAL | 6 refills | Status: DC | PRN

## 2021-02-24 NOTE — Patient Instructions (Addendum)
Medication Instructions:   No changes  Lasix 20 mg - may take a tablet as needed daily  for swelling or shortness of breath  *If you need a refill on your cardiac medications before your next appointment, please call your pharmacy*   Lab Work: Not needed    Testing/Procedures:  Not needed  Follow-Up: At [facility], you and your health needs are our priority.  As part of our continuing mission to provide you with exceptional heart care, we have created designated Provider Care Teams.  These Care Teams include your primary Cardiologist (physician) and Advanced Practice Providers (APPs -  Physician Assistants and Nurse Practitioners) who all work together to provide you with the care you need, when you need it.     Your next appointment:   12 month(s)  The format for your next appointment:   In Person  Provider:   Glenetta Hew, MD

## 2021-03-05 ENCOUNTER — Encounter: Payer: Self-pay | Admitting: Cardiology

## 2021-03-05 NOTE — Assessment & Plan Note (Signed)
Extremely deconditioned, her sleep apnea, diabetes, hypertension and venous stasis all revolve around her being obese.  She is very reluctant to exercise.  We talked about using a pedal machine.  She really needs home health.  We talked about dietary modification.  She really would benefit from nutritional consultation, but is reluctant to do that as well.

## 2021-03-05 NOTE — Assessment & Plan Note (Signed)
History of orthostatic hypotension   Falls  Recurrent falls, could be due to orthostasis vs. Deconditioning -Would likely benefit from home PT; patient declines today -Encouraged use of walker     100% agree with home PT.  She was adamant about not doing it.  We talked for quite a while about different types of exercise.  I recommended that she use a stationary pedal machine.  Provided a website with different options and different choices.  This allows her to sit in a chair or couch and pedal the machine without having to leave early the house or do anything excessive.  She can be sitting comfortably.

## 2021-03-05 NOTE — Assessment & Plan Note (Signed)
Diastolic function noted on echo, but not really true diastolic heart failure.  Currently not on any medications other than as needed diuretic.  She has no real PND or orthopnea, and edema is probably not cardiac in nature.  Has had multiple different evaluations of her heart EF and ischemic evaluation that have been normal.

## 2021-03-05 NOTE — Assessment & Plan Note (Addendum)
SSS s/p PPM Followed by EP. Last visit 12/01/2020, no changes were made. CXR obtained given patient concern about pacemaker moving around- seemed to be in stable position.    A sensed V paced rhythm on EKG.  This was placed because of a syncopal episode.  Functioning well.

## 2021-03-05 NOTE — Progress Notes (Signed)
ATTENDING ATTESTATION  I have seen, examined and evaluated the patient along with the Resident Physician in clinic today.  I personally performed my own interview & exanimation.  After reviewing all the available data and chart, we discussed the patients laboratory, study & physical findings as well as symptoms in detail. I agree with her findings, examination as well as impression recommendations as per our discussion.    Attending adjustments int the full clinic noted annotated in [place].   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 Last visit 12/01/2020, no changes were made. CXR obtained given patient concern about pacemaker moving around- seemed to be in stable position.    A sensed V paced rhythm on EKG.  This was placed because of a syncopal episode.  Functioning well.  Orthostatic dizziness History of orthostatic hypotension   Falls  Recurrent falls, could be due to orthostasis vs. Deconditioning -Would likely benefit from home PT; patient declines today -Encouraged use of walker     100% agree with home PT.  She was adamant about not doing it.  We talked for quite a while about different types of exercise.  I recommended that she use a stationary pedal machine.  Provided a website with different options and different choices.  This allows her to sit in a chair or couch and pedal the machine without having to leave early the house or do anything excessive.  She can be sitting comfortably.  Morbid obesity ([place]) Extremely deconditioned, her sleep apnea, diabetes, hypertension and venous stasis all revolve around her being obese.  She is very reluctant to exercise.  We talked about using a pedal machine.  She really needs home health.  We talked about dietary modification.  She really would benefit from nutritional consultation, but is reluctant to do that as well.  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 On insulin and glipizide.  -F/u with PCP   OSA (obstructive sleep apnea) Sleep apnea:  Sleeps with 2-3L [place] as needed.    Previously followed by Dr. Baltazar Apo; did not tolerate CPAP  Venous stasis of both lower extremities with edema Venous stasis:  -Off lasix, will restart PRN for edema -Encouraged SupportStockings and foot elevation.   She also needs to exercise -> talked about doing plantarflexion dorsiflexion exercises, the pedal bike will very much help for this  With her having orthostatic hypotension, trying to avoid diuretic.  Use only for PRN.  Hyperlipidemia with target LDL less than 130 HLD: Chronic, at goal  LDL goal < 130. Last LDL was 42 in March 2022.  -Continue Rosuvastatin 20 mg   Labs as of March 2022 look great. ->  No changes to meds  Essential hypertension HTN: Chronic, stable Normotensive today. Has history of hypotension, previously on midodrine.  -Off medications, monitor    We have gradually weaned off of all medications to allow for permissive hypertension due to orthostatic hypotension.  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  Not currently on midodrine, but maintaining stable blood pressures.  Only using diuretic PRN.  Left ventricular diastolic dysfunction Diastolic function noted on echo, but not really true diastolic heart failure.  Currently not on any medications other than as needed diuretic.  She has no real PND or orthopnea, and edema is probably not cardiac in nature.  Has had multiple different evaluations  of her heart EF and ischemic evaluation that have been normal.     Glenetta Hew, M.D., M.S. Interventional Cardiologist   Pager # [phone] Phone # [phone] [address] [place], [place] [address]

## 2021-03-05 NOTE — Assessment & Plan Note (Addendum)
Sleep apnea:  Sleeps with 2-3L [place] as needed.    Previously followed by Dr. Baltazar Apo; did not tolerate CPAP

## 2021-03-05 NOTE — Assessment & Plan Note (Signed)
Type 2 DM  Last A1c was 8.4, 10 months ago, she is overdue. On insulin and glipizide.  -F/u with PCP

## 2021-03-05 NOTE — Assessment & Plan Note (Addendum)
HLD: Chronic, at goal  LDL goal < 130. Last LDL was 42 in March 2022.  -Continue Rosuvastatin 20 mg   Labs as of March 2022 look great. ->  No changes to meds

## 2021-03-05 NOTE — Assessment & Plan Note (Signed)
Venous stasis:  -Off lasix, will restart PRN for edema -Encouraged SupportStockings and foot elevation.   She also needs to exercise -> talked about doing plantarflexion dorsiflexion exercises, the pedal bike will very much help for this  With her having orthostatic hypotension, trying to avoid diuretic.  Use only for PRN.

## 2021-03-05 NOTE — Assessment & Plan Note (Signed)
HTN: Chronic, stable Normotensive today. Has history of hypotension, previously on midodrine.  -Off medications, monitor    We have gradually weaned off of all medications to allow for permissive hypertension due to orthostatic hypotension.  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  Not currently on midodrine, but maintaining stable blood pressures.  Only using diuretic PRN.

## 2021-03-18 ENCOUNTER — Ambulatory Visit (INDEPENDENT_AMBULATORY_CARE_PROVIDER_SITE_OTHER): Payer: Medicare HMO

## 2021-03-18 DIAGNOSIS — I495 Sick sinus syndrome: Secondary | ICD-10-CM

## 2021-03-18 LAB — CUP PACEART REMOTE DEVICE CHECK
Battery Remaining Longevity: 96 mo
Battery Remaining Percentage: 92 %
Brady Statistic RA Percent Paced: 95 %
Brady Statistic RV Percent Paced: 87 %
Date Time Interrogation Session: 20230209030100
Implantable Lead Implant Date: 20170214
Implantable Lead Implant Date: 20170214
Implantable Lead Location: 753859
Implantable Lead Location: 753860
Implantable Lead Model: 7740
Implantable Lead Model: 7741
Implantable Lead Serial Number: 649859
Implantable Lead Serial Number: 728741
Implantable Pulse Generator Implant Date: 20170214
Lead Channel Impedance Value: 730 Ohm
Lead Channel Impedance Value: 978 Ohm
Lead Channel Pacing Threshold Amplitude: 0.4 V
Lead Channel Pacing Threshold Amplitude: 2 V
Lead Channel Pacing Threshold Pulse Width: 0.4 ms
Lead Channel Pacing Threshold Pulse Width: 0.4 ms
Lead Channel Setting Pacing Amplitude: 2 V
Lead Channel Setting Pacing Amplitude: 2.2 V
Lead Channel Setting Pacing Pulse Width: 0.4 ms
Lead Channel Setting Sensing Sensitivity: 2.5 mV
Pulse Gen Serial Number: 718098

## 2021-03-23 NOTE — Progress Notes (Signed)
Remote pacemaker transmission.   

## 2021-03-24 ENCOUNTER — Other Ambulatory Visit: Payer: Self-pay | Admitting: Adult Health

## 2021-04-05 ENCOUNTER — Ambulatory Visit: Payer: Medicare HMO | Admitting: Adult Health

## 2021-04-05 NOTE — Progress Notes (Unsigned)
PATIENT: Tara Jackson DOB: 1940/08/28  REASON FOR VISIT: follow up- headache, memory loss, gait impairment  HISTORY FROM: patient and husband  No chief complaint on file.    HISTORY OF PRESENT ILLNESS:  Tara Jackson is a 81 y.o. female with extensive prior medical history and has been followed in this office since 2014 for headaches and memory loss which has been stable throughout the years.  Eval by Dr. Leonie Man 11/2020 for increased difficulty with walking and frequent falls in the last 6 months likely due to combination of diabetic axonal polyneuropathy, degenerative lumbar spine disease and deconditioning.  She also has mild dementia and memory difficulties which appear stable.    Update 04/05/2021 JM: Returns for follow-up visit after prior visit 4 months ago.    Continues to ambulate short distances with RW and w/c for long distance. Reports continued falls but thankfully without significant injury or hitting head. Routinely followed by cardiology for venous stasis BLE and history of orthostatic hypotension - per note review, recurrent falls possibly in setting of orthostasis with dizziness vs deconditioning.  Was offered PT but patient declined. Prior c/o BLE dysesthesias therefore nortriptyline dosage increased to 50 mg twice daily.   Memory loss complaints unchanged.  Prior MMSE 21/30 (completed 11/2020).  Remains on Aricept 10 mg nightly without side effects.      History provided for reference purposes only Update 11/24/2020 Dr. Leonie Man: She returns for follow-up after last visit 01/05/2020.  Patient had a fall in June and fractured her left ankle and had to wear a boot for several months.  Now she is able to bear some weight and walk without the boot.  She is finished home physical and occupational therapy.  She is able to walk a little bit with a walker but uses mostly a wheelchair for long distances.  She has had no further falls.  She was seen in the ER on 04/10/2020 for  worsening gait and balance difficulties for a month.  CT scan of the head showed no acute abnormality and changes of small vessel disease and mild atrophy. CT scan of the cervical spine showed moderate to severe multilevel degenerative changes at C3-4 and C5-6.  MRI cannot be obtained as she has a pacemaker.  Patient states her memory difficulties are about unchanged.  She did better today on the Mini-Mental status exam and scored 21/30 which was improvement from 19/30 from a year ago.  She continues to have some tingling numbness in her feet bilaterally which are worse.  At times she is unable to sleep at night because of this.  She takes nortriptyline 50 mg but only in the morning.  She has not tried  taking the nighttime dose yet.  Update 12/26/2019 JM: Tara Jackson returns for medication refill of Aricept as she has not been seen since 02/2018.  At that time, evaluated by Dr. Leonie Man for complaints of memory and cognitive difficulties.  She has tolerated Aricept 10 mg nightly without side effects.  Subjectively, she reports worsening of her cognition per husband reports that has been stable.  MMSE today 19/30 (prior 19/30).  She does report ongoing depression/anxiety currently treated with buspirone, clonazepam twice daily and lorazepam as needed.  She is sedentary primarily transferring via wheelchair and will occasionally ambulate short distances.  She does not participate in any type of routine activity or exercise.  She returns today for evaluation.        REVIEW OF SYSTEMS: Out of a  complete 14 system review of symptoms, the patient complains of those listed in HPI and all other systems negative    ALLERGIES: Allergies  Allergen Reactions   Iodine Other (See Comments)    ONLY IV--Makes unconscious   Iohexol Other (See Comments)     Code: SOB, Desc: cardiac arrest w/ iv contrast, has never used 13 hr prep//alice calhoun, Onset Date: [id number]    Metoclopramide Hcl Other (See Comments)     suicidal   Sulfa Antibiotics Hives   Lactose Intolerance (Gi) Diarrhea   Lansoprazole Nausea And Vomiting    HOME MEDICATIONS: Outpatient Medications Prior to Visit  Medication Sig Dispense Refill   acetaminophen (TYLENOL) 325 MG tablet Take 2 tablets (650 mg total) by mouth every 6 (six) hours as needed for up to 30 doses for mild pain or moderate pain. 30 tablet 0   albuterol (VENTOLIN HFA) 108 (90 Base) MCG/ACT inhaler INHALE 2 INHALATIONS 3 TIMES A DAY AS NEEDED FOR COUGH/WHEEZE 18 each PRN   busPIRone (BUSPAR) 10 MG tablet Take 1 tablet (10 mg total) by mouth every morning. 60 tablet 1   Cholecalciferol (VITAMIN D-3) 25 MCG (1000 UT) CAPS Take 2,000 Units by mouth every morning.     clonazePAM (KLONOPIN) 0.5 MG tablet Take 0.5-1 mg by mouth See admin instructions. Takes 1mg  in am and 0.5mg  in pm.     clopidogrel (PLAVIX) 75 MG tablet Take 75 mg by mouth every morning.     dicyclomine (BENTYL) 20 MG tablet Take 40 mg by mouth in the morning and at bedtime.     donepezil (ARICEPT) 10 MG tablet TAKE 1 TABLET BY MOUTH EVERYDAY AT BEDTIME 90 tablet 0   esomeprazole (NEXIUM) 40 MG capsule Take 40 mg by mouth daily.     furosemide (LASIX) 20 MG tablet Take 1 tablet (20 mg total) by mouth daily as needed. For swelling or shortness of breath 30 tablet 6   glipiZIDE (GLUCOTROL) 10 MG tablet Take 20 mg by mouth 2 (two) times daily before a meal.      guaiFENesin (MUCINEX) 600 MG 12 hr tablet Take 600 mg by mouth 2 (two) times daily as needed for cough.     insulin NPH (HUMULIN N,NOVOLIN N) 100 UNIT/ML injection Inject 5-20 Units into the skin See admin instructions. In the morning:  If cbg >300, then give 20 units. If cbg < or = 300, then give 15 unit At night: If cbg >250, then give 5 units. If cbg < or = 250, then give no insulin     insulin regular (NOVOLIN R,HUMULIN R) 100 units/mL injection Inject 0-30 Units into the skin See admin instructions. At breakfast: if cbg < 300, then give 25 units.  If cbg > or = 300, then give 30 units. After dinner: goal cbgs is 150 and give insulin dose to reach this. For every 100 cbg over 150, then give 10 units of insulin.     levothyroxine (SYNTHROID) 100 MCG tablet Take 100 mcg by mouth daily before breakfast.     lidocaine (LIDODERM) 5 % Place 2 patches onto the skin daily as needed (pain). Remove & Discard patch within 12 hours or as directed by MD     linaclotide (LINZESS) 290 MCG CAPS capsule Take 290 mcg by mouth daily before breakfast.     Menthol-Methyl Salicylate (MUSCLE RUB) 10-15 % CREA Apply 1 application topically as needed for muscle pain.     nortriptyline (PAMELOR) 50 MG capsule Take 1 capsule (  50 mg total) by mouth 2 (two) times daily. 180 capsule 1   OXYGEN Inhale 2 L into the lungs as needed. Used at overnight with a concentrator     pantoprazole (PROTONIX) 40 MG tablet TAKE 1 TABLET BY MOUTH EVERY DAY 90 tablet 3   potassium chloride SA (KLOR-CON M) 20 MEQ tablet Take 20 mEq by mouth 2 (two) times daily. Take 1/2 tablet twice daily.     rosuvastatin (CRESTOR) 20 MG tablet Take 20 mg by mouth daily.      solifenacin (VESICARE) 5 MG tablet Take 5 mg by mouth daily as needed (to prevent UTI).     SYMBICORT 80-4.5 MCG/ACT inhaler INHALE 2 PUFFS FIRST THING IN THE MORNING THEN ANOTHER 2 PUFFS ABOUT 12 HOURS LATER 30.6 each 4   terconazole (TERAZOL 7) 0.4 % vaginal cream Place 1 applicator vaginally daily as needed for itching.     traMADol (ULTRAM) 50 MG tablet Take 1 tablet (50 mg total) by mouth every 12 (twelve) hours as needed for up to 20 doses. 20 tablet 0   No facility-administered medications prior to visit.    PAST MEDICAL HISTORY: Past Medical History:  Diagnosis Date   Anemia    Anxiety    Arthritis    Asthma    Brittle bone disease    CKD (chronic kidney disease)    Complication of anesthesia    hard to wake up after anesthesia, trouble turning head   Coronary artery disease, non-occlusive 2014   a. minimal CAD by  cath in 2014 with 40% RI stenosis. b. low-risk NST in 11/2014.   Depression    Diabetes mellitus type 2, insulin dependent (HCC)    Diabetic neuropathy (HCC)    Diabetic retinopathy ([place])    NPDR OU   Diastolic dysfunction, left ventricle 11/30/14   EF 55%, grade 1 DD   DVT (deep venous thrombosis) ([place])    "18 in RLE; 7 LLE prior to PE" (03/24/2015)   Family history of adverse reaction to anesthesia    " the whole family is hard to wake up"   Glaucoma    H/O hiatal hernia    Hyperlipidemia    Hypertension associated with diabetes ([place])    Hypertensive retinopathy    OU   Hypothyroidism    Kidney stones    "passed them"   Macular degeneration    Exu OU   Memory loss 06/03/2014   On home oxygen therapy    "2L oxygen concentrator @ night"   Osteoporosis    Oxygen desaturation during sleep    wears 2 liters of oxygen at night    Palpitations 35years ago   Cardionet monitor - revealed mostly normal sinus rhythm, sinus bradycardia with first-degree A-V block heart rates mostly in the 50s and 60s with some 70s. No arrhythmias, PVCs or PACs noted.   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 Weakness Rt hand   Symptomatic bradycardia 03/24/2015  a. s/p [employer] PPM in 03/2015 by Dr. Lovena Le secondary to sinus node dysfunction.    TIA (transient ischemic attack) March 2014    PAST SURGICAL HISTORY: Past Surgical History:  Procedure Laterality Date   ABDOMINAL HYSTERECTOMY     APPENDECTOMY     BACK SURGERY     steroid inj   CATARACT EXTRACTION Bilateral    CATARACT EXTRACTION W/ INTRAOCULAR  LENS  IMPLANT, BILATERAL Bilateral 2000s   CHOLECYSTECTOMY     EP IMPLANTABLE DEVICE N/A 03/24/2015   [facility] PPM, Dr. Lovena Le   ESOPHAGOGASTRODUODENOSCOPY  08/09/2011   Procedure: ESOPHAGOGASTRODUODENOSCOPY (EGD);  Surgeon: Jeryl Columbia, MD;  Location: [facility];  Service: Endoscopy;  Laterality: N/A;   ESOPHAGOGASTRODUODENOSCOPY (EGD) WITH PROPOFOL N/A 11/12/2013   Procedure: ESOPHAGOGASTRODUODENOSCOPY (EGD) WITH PROPOFOL;  Surgeon: Jeryl Columbia, MD;  Location: [facility] ENDOSCOPY;  Service: Endoscopy;  Laterality: N/A;   ESOPHAGOGASTRODUODENOSCOPY (EGD) WITH PROPOFOL N/A 05/05/2016   Procedure: ESOPHAGOGASTRODUODENOSCOPY (EGD) WITH PROPOFOL;  Surgeon: Clarene Essex, MD;  Location: [facility] ENDOSCOPY;  Service: Endoscopy;  Laterality: N/A;   EYE SURGERY Bilateral    Cat Sx OU   GLAUCOMA SURGERY Bilateral 2000s   "laser"   HOT HEMOSTASIS  08/09/2011   Procedure: HOT HEMOSTASIS (ARGON PLASMA COAGULATION/BICAP);  Surgeon: Jeryl Columbia, MD;  Location: [facility];  Service: Endoscopy;  Laterality: N/A;   HOT HEMOSTASIS N/A 11/12/2013   Procedure: HOT HEMOSTASIS (ARGON PLASMA COAGULATION/BICAP);  Surgeon: Jeryl Columbia, MD;  Location: [facility];  Service: Endoscopy;  Laterality: N/A;   INSERT / REPLACE / REMOVE PACEMAKER  03/24/2015   IRIDOTOMY / IRIDECTOMY Bilateral    JOINT REPLACEMENT     KNEE SURGERY Left done with 2nd left knee replacement   spacer bar placement   LEFT HEART CATHETERIZATION WITH CORONARY ANGIOGRAM N/A 02/14/2012   Procedure: LEFT HEART CATHETERIZATION WITH CORONARY ANGIOGRAM;  Surgeon: Leonie Man, MD;  Location: [facility] CATH LAB;  Service: Cardiovascular:: no evidence of obstructive coronary disease ,low EDP with normal EF   LEV DOPPLER  05/29/2012   RIGHT EXTREM. NORMAL VENOUS DUPLEX   NM MYOCAR PERF WALL MOTION  08/2019   EF 55 to 65%.  No ischemia or infarction.  LOW RISK:   PACEMAKER PLACEMENT     REVISION TOTAL KNEE ARTHROPLASTY Left    TONSILLECTOMY     TOTAL KNEE  ARTHROPLASTY Bilateral    TRANSTHORACIC ECHOCARDIOGRAM  11/2017   11/2017: EF 60-65%. Gr1 DD. PAP ~33 mmHg.  Mild TR. PPM leads in RA & RV.    TRIGGER FINGER RELEASE  11/22/2011   Procedure: RELEASE TRIGGER FINGER/A-1 PULLEY;  Surgeon: Meredith Pel, MD;  Location: [facility];  Service: Orthopedics;  Laterality: Left;  Left trigger thumb release   TRIGGER FINGER RELEASE Right yrs ago    FAMILY HISTORY: Family History  Problem Relation Age of Onset   Cancer Mother    Diabetes Mother    Cancer - Prostate Father    Diabetes Father    Retinal detachment Son    Diabetes Sister    Diabetes Brother    Diabetes Paternal Grandmother     SOCIAL HISTORY: Social History   Socioeconomic History   Marital status: Married    Spouse name: james   Number of children: 2   Years of education: college   Highest education level: Not on file  Occupational History   Occupation: retired  Tobacco Use   Smoking status: Never    Passive exposure: Yes  Smokeless tobacco: Never  Vaping Use   Vaping Use: Never used  Substance and Sexual Activity   Alcohol use: No    Alcohol/week: 0.0 standard drinks   Drug use: No   Sexual activity: Yes  Other Topics Concern   Not on file  Social History Narrative   Married [name].) married 96 years.   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    Social Determinants of Health   Financial Resource Strain: Not on file  Food Insecurity: Not on file  Transportation Needs: Not on file  Physical Activity: Not on file  Stress: Not on file  Social Connections: Not on file  Intimate Partner Violence: Not on file      PHYSICAL EXAM  There were no vitals filed for this visit.  There is no height or weight on file to calculate BMI. MMSE - Mini Mental State Exam 11/24/2020 12/26/2019 02/20/2018  Orientation to time 3 3 1   Orientation to Place 5 4 5   Registration 3 3 3   Attention/ Calculation 1 1 1   Recall 1 1 0   Language- name 2 objects 2 2 2   Language- repeat 1 1 1   Language- follow 3 step command 3 2 3   Language- read & follow direction 1 1 1   Write a sentence 1 1 1   Copy design 0 0 1  Total score 21 19 19     Generalized: Well developed obese elderly African-American lady, in no acute distress,   Neurological examination  Mentation: Alert oriented to time, place, history taking. Follows all commands speech and language fluent.   Cranial nerve II-XII: Pupils were equal round reactive to light. Extraocular movements were full, visual field were full on confrontational test. Facial sensation and strength were normal. Uvula tongue midline. Head turning and shoulder shrug  were normal and symmetric. Motor: The motor testing reveals 5 over 5 strength LUE, LLE, RLE. 4/5 in RUE. Good symmetric motor tone is noted throughout.  Sensory: Sensory testing is intact to soft touch on all 4 extremities. No evidence of extinction is noted. Diminished vibration sensation of her ankles and toes bilaterally. Coordination: Cerebellar testing reveals good finger-nose-finger and heel-to-shin bilaterally.  Gait and station: Deferred Reflexes: Deep tendon reflexes are symmetric and Depressed bilaterally.        ASSESSMENT AND PLAN 81 y.o. year old female here with complaints of memory difficulties due to mild cognitive impairment versus early dementia and eval by Dr. Leonie Man in 11/2020 for increased difficulty with walking and frequent falls in the last 6 months likely due to combination of diabetic axonal polyneuropathy, degenerative lumbar spine disease and deconditioning.     Continue nortriptyline 50 mg twice daily for headaches, nocturnal dysesthesias and sleep (initially on by PCP for anxiety) Continue Aricept 10 mg nightly for MCI She was also encouraged to increase participation in cognitively challenging activities like solving crossword puzzles, playing bridge and sodoku as well as increasing physical  activity and exercise, maintaining healthy diet, and adequate sleep      Follow-up in 1 year or call earlier if needed   CC:  [place] provider: Dr. Rhea Bleacher, Jenny Reichmann, MD    I spent 30 minutes of face-to-face and non-face-to-face time with patient and husband.  This included previsit chart review, lab review, study review, order entry, electronic health record documentation, patient education and discussion regarding MCI vs early dementia, completion and review of MMSE, subjective memory worsening and answered all related questions,  continuation of Aricept and answered all other questions to patient and husband satisfaction   Frann Rider, AGNP-BC  [facility] [address] [place], [place] [address]  Phone [phone] Fax [phone] Note: This document was prepared with digital dictation and possible smart phrase technology. Any transcriptional errors that result from this process are unintentional.

## 2021-04-25 ENCOUNTER — Other Ambulatory Visit: Payer: Self-pay | Admitting: Adult Health

## 2021-05-13 ENCOUNTER — Ambulatory Visit (INDEPENDENT_AMBULATORY_CARE_PROVIDER_SITE_OTHER): Payer: Medicare HMO

## 2021-05-13 DIAGNOSIS — I495 Sick sinus syndrome: Secondary | ICD-10-CM

## 2021-05-13 NOTE — Patient Instructions (Signed)
You pacemaker checked out fine per Dr. Lovena Le.  He recommends taking tylenol as needed for pain. If you were to see any redness, drainage or swelling please call the device clinic. ? ?[facility] [phone] ?

## 2021-05-20 ENCOUNTER — Other Ambulatory Visit: Payer: Self-pay | Admitting: Cardiology

## 2021-05-25 ENCOUNTER — Ambulatory Visit: Payer: Medicare HMO | Admitting: Adult Health

## 2021-05-27 ENCOUNTER — Encounter: Payer: Self-pay | Admitting: Adult Health

## 2021-05-27 ENCOUNTER — Ambulatory Visit: Payer: Medicare HMO | Admitting: Adult Health

## 2021-05-27 VITALS — BP 139/72 | HR 60

## 2021-05-27 DIAGNOSIS — G3184 Mild cognitive impairment, so stated: Secondary | ICD-10-CM | POA: Diagnosis not present

## 2021-05-27 DIAGNOSIS — R269 Unspecified abnormalities of gait and mobility: Secondary | ICD-10-CM | POA: Diagnosis not present

## 2021-05-27 NOTE — Progress Notes (Signed)
? ? ?PATIENT: Tara Jackson ?DOB: [birth year] ? ?REASON FOR VISIT: follow up- headache, memory loss ?HISTORY FROM: patient and husband ? ?Chief Complaint  ?Patient presents with  ? Follow-up  ?  RM 3 with [name] ?Pt is well, still having memory issues, thinks she has had another stroke and has had a few falls since last visit   ?  ? ?HISTORY OF PRESENT ILLNESS: ? ?Tara Jackson is a 81 y.o. female who has been followed in this office over the past 10 years for chronic headaches, memory loss and gait difficulties ? ?Update 05/27/2021 JM: Patient was previously seen 6 months ago by Dr. Leonie Man for concern of increased falls and worsening gait.  He felt this was likely multifactorial in setting of diabetic axonal polyneuropathy, degenerative lumbar spine disease and deconditioning. She complains of further gait difficulty and more so complains of left leg weakness.  She believes she has had a stroke because of this left leg weakness.  Initially reports onset of weakness over the past week but then reports it can fluctuate with at times stronger than others which has been going on over the past few months.  She does note type of exercise. She only stands to transfer into her wheelchair.  She is greatly against working with PT.  She reports she limits her movement and does not exercise due to pain. ? ?Reports 2-3 headaches per month. She has remained on nortriptyline 50 mg twice daily, denies side effects. ? ?Reports memory stable.  MMSE today 17/30 (prior 21/30).  She has continued on Aricept 10 mg nightly. ? ?She has remained on Plavix and Crestor.  Blood pressure today 139/72. ? ?Throughout entirety of visit she was upset that she was not seeing Dr. Leonie Man.  When she was asked to clarify, she was upset because she feels like she had a stroke and this needed to be evaluated by a doctor. Discussed low suspicion for a stroke but did offer imaging to ensure, she declined interest. She stated she did not go to the emergency  room when she started having these concerns as she is sick of going to the hospital.  ? ? ? ? ?History provided for reference purposes only ?Update 11/24/2020 Dr. Leonie Man: She returns for follow-up after last visit 01/05/2020.  Patient had a fall in June and fractured her left ankle and had to wear a boot for several months.  Now she is able to bear some weight and walk without the boot.  She is finished home physical and occupational therapy.  She is able to walk a little bit with a walker but uses mostly a wheelchair for long distances.  She has had no further falls.  She was seen in the ER on 04/10/2020 for worsening gait and balance difficulties for a month.  CT scan of the head showed no acute abnormality and changes of small vessel disease and mild atrophy. ?CT scan of the cervical spine showed moderate to severe multilevel degenerative changes at C3-4 and C5-6.  MRI cannot be obtained as she has a pacemaker.  Patient states her memory difficulties are about unchanged.  She did better today on the Mini-Mental status exam and scored 21/30 which was improvement from 19/30 from a year ago.  She continues to have some tingling numbness in her feet bilaterally which are worse.  At times she is unable to sleep at night because of this.  She takes nortriptyline 50 mg but only in the morning.  She  has not tried  taking the nighttime dose yet. ? ? ? ? REVIEW OF SYSTEMS: Out of a complete 14 system review of symptoms, the patient complains of those listed in HPI and all other systems negative ? ? ? ?ALLERGIES: ?Allergies  ?Allergen Reactions  ? Iodine Other (See Comments)  ?  ONLY IV--Makes unconscious  ? Iohexol Other (See Comments)  ?   Code: SOB, Desc: cardiac arrest w/ iv contrast, has never used 13 hr prep//alice calhoun, Onset Date: [id number] ?  ? Metoclopramide Hcl Other (See Comments)  ?  suicidal  ? Sulfa Antibiotics Hives  ? Lactose Intolerance (Gi) Diarrhea  ? Lansoprazole Nausea And Vomiting  ? ? ?HOME  MEDICATIONS: ?Outpatient Medications Prior to Visit  ?Medication Sig Dispense Refill  ? acetaminophen (TYLENOL) 325 MG tablet Take 2 tablets (650 mg total) by mouth every 6 (six) hours as needed for up to 30 doses for mild pain or moderate pain. 30 tablet 0  ? albuterol (VENTOLIN HFA) 108 (90 Base) MCG/ACT inhaler INHALE 2 INHALATIONS 3 TIMES A DAY AS NEEDED FOR COUGH/WHEEZE 18 each PRN  ? busPIRone (BUSPAR) 10 MG tablet Take 1 tablet (10 mg total) by mouth every morning. 60 tablet 1  ? Cholecalciferol (VITAMIN D-3) 25 MCG (1000 UT) CAPS Take 2,000 Units by mouth every morning.    ? clonazePAM (KLONOPIN) 0.5 MG tablet Take 0.5-1 mg by mouth See admin instructions. Takes '1mg'$  in am and 0.'5mg'$  in pm.    ? clopidogrel (PLAVIX) 75 MG tablet Take 75 mg by mouth every morning.    ? dicyclomine (BENTYL) 20 MG tablet Take 40 mg by mouth in the morning and at bedtime.    ? donepezil (ARICEPT) 10 MG tablet TAKE 1 TABLET BY MOUTH EVERYDAY AT BEDTIME 90 tablet 0  ? esomeprazole (NEXIUM) 40 MG capsule Take 40 mg by mouth daily.    ? furosemide (LASIX) 20 MG tablet Take 1 tablet (20 mg total) by mouth daily as needed. For swelling or shortness of breath 30 tablet 6  ? glipiZIDE (GLUCOTROL) 10 MG tablet Take 20 mg by mouth 2 (two) times daily before a meal.     ? guaiFENesin (MUCINEX) 600 MG 12 hr tablet Take 600 mg by mouth 2 (two) times daily as needed for cough.    ? insulin NPH (HUMULIN N,NOVOLIN N) 100 UNIT/ML injection Inject 5-20 Units into the skin See admin instructions. In the morning:  If cbg >300, then give 20 units. If cbg < or = 300, then give 15 unit ?At night: If cbg >250, then give 5 units. If cbg < or = 250, then give no insulin    ? insulin regular (NOVOLIN R,HUMULIN R) 100 units/mL injection Inject 0-30 Units into the skin See admin instructions. At breakfast: if cbg < 300, then give 25 units. If cbg > or = 300, then give 30 units. ?After dinner: goal cbgs is 150 and give insulin dose to reach this. For every 100  cbg over 150, then give 10 units of insulin.    ? levothyroxine (SYNTHROID) 100 MCG tablet Take 100 mcg by mouth daily before breakfast.    ? lidocaine (LIDODERM) 5 % Place 2 patches onto the skin daily as needed (pain). Remove & Discard patch within 12 hours or as directed by MD    ? linaclotide (LINZESS) 290 MCG CAPS capsule Take 290 mcg by mouth daily before breakfast.    ? Menthol-Methyl Salicylate (MUSCLE RUB) 10-15 % CREA Apply 1  application topically as needed for muscle pain.    ? nortriptyline (PAMELOR) 50 MG capsule Take 1 capsule (50 mg total) by mouth 2 (two) times daily. 180 capsule 1  ? OXYGEN Inhale 2 L into the lungs as needed. Used at overnight with a concentrator    ? pantoprazole (PROTONIX) 40 MG tablet TAKE 1 TABLET BY MOUTH EVERY DAY 90 tablet 3  ? potassium chloride SA (KLOR-CON M) 20 MEQ tablet Take 20 mEq by mouth 2 (two) times daily. Take 1/2 tablet twice daily.    ? rosuvastatin (CRESTOR) 20 MG tablet Take 20 mg by mouth daily.     ? solifenacin (VESICARE) 5 MG tablet Take 5 mg by mouth daily as needed (to prevent UTI).    ? SYMBICORT 80-4.5 MCG/ACT inhaler INHALE 2 PUFFS FIRST THING IN THE MORNING THEN ANOTHER 2 PUFFS ABOUT 12 HOURS LATER 30.6 each 4  ? terconazole (TERAZOL 7) 0.4 % vaginal cream Place 1 applicator vaginally daily as needed for itching.    ? traMADol (ULTRAM) 50 MG tablet Take 1 tablet (50 mg total) by mouth every 12 (twelve) hours as needed for up to 20 doses. 20 tablet 0  ? ?No facility-administered medications prior to visit.  ? ? ?PAST MEDICAL HISTORY: ?Past Medical History:  ?Diagnosis Date  ? Anemia   ? Anxiety   ? Arthritis   ? Asthma   ? Brittle bone disease   ? CKD (chronic kidney disease)   ? Complication of anesthesia   ? hard to wake up after anesthesia, trouble turning head  ? Coronary artery disease, non-occlusive 2014  ? a. minimal CAD by cath in 2014 with 40% RI stenosis. b. low-risk NST in 11/2014.  ? Depression   ? Diabetes mellitus type 2, insulin  dependent ([place])   ? Diabetic neuropathy ([place])   ? Diabetic retinopathy ([place])   ? NPDR OU  ? Diastolic dysfunction, left ventricle 11/30/14  ? EF 55%, grade 1 DD  ? DVT (deep venous thrombosis) (HCC)   ? "18 in RLE;

## 2021-06-01 ENCOUNTER — Telehealth: Payer: Self-pay

## 2021-06-01 NOTE — Telephone Encounter (Signed)
PC check in call, no answer ?

## 2021-06-04 ENCOUNTER — Other Ambulatory Visit: Payer: Self-pay | Admitting: Cardiology

## 2021-06-04 ENCOUNTER — Other Ambulatory Visit: Payer: Self-pay | Admitting: Neurology

## 2021-06-16 ENCOUNTER — Telehealth: Payer: Self-pay

## 2021-06-16 NOTE — Telephone Encounter (Signed)
(  1:50 pm) PC SW left a message for patient and her husband requesting a call back to discuss patient status and needs.  ? ? ?

## 2021-06-17 ENCOUNTER — Ambulatory Visit (INDEPENDENT_AMBULATORY_CARE_PROVIDER_SITE_OTHER): Payer: Medicare HMO

## 2021-06-17 DIAGNOSIS — I495 Sick sinus syndrome: Secondary | ICD-10-CM | POA: Diagnosis not present

## 2021-06-18 ENCOUNTER — Telehealth: Payer: Self-pay

## 2021-06-18 LAB — CUP PACEART REMOTE DEVICE CHECK
Battery Remaining Longevity: 90 mo
Battery Remaining Percentage: 86 %
Brady Statistic RA Percent Paced: 96 %
Brady Statistic RV Percent Paced: 90 %
Date Time Interrogation Session: 20230511030100
Implantable Lead Implant Date: 20170214
Implantable Lead Implant Date: 20170214
Implantable Lead Location: 753859
Implantable Lead Location: 753860
Implantable Lead Model: 7740
Implantable Lead Model: 7741
Implantable Lead Serial Number: 649859
Implantable Lead Serial Number: 728741
Implantable Pulse Generator Implant Date: 20170214
Lead Channel Impedance Value: 712 Ohm
Lead Channel Impedance Value: 941 Ohm
Lead Channel Pacing Threshold Amplitude: 0.5 V
Lead Channel Pacing Threshold Amplitude: 1.8 V
Lead Channel Pacing Threshold Pulse Width: 0.4 ms
Lead Channel Pacing Threshold Pulse Width: 0.4 ms
Lead Channel Setting Pacing Amplitude: 2 V
Lead Channel Setting Pacing Amplitude: 2.2 V
Lead Channel Setting Pacing Pulse Width: 0.4 ms
Lead Channel Setting Sensing Sensitivity: 2.5 mV
Pulse Gen Serial Number: 718098

## 2021-06-18 NOTE — Telephone Encounter (Signed)
(  1:55 pm) SW attempted to contact patient and her spouse to assess patient's needs and status. SW did not receive an answer and was unable to leave a message.  ?

## 2021-06-24 NOTE — Progress Notes (Signed)
Remote pacemaker transmission.   

## 2021-06-30 NOTE — Progress Notes (Shared)
[facility] Note  07/02/2021     CHIEF COMPLAINT Patient presents for No chief complaint on file.    HISTORY OF PRESENT ILLNESS: Tara Jackson is a 81 y.o. female who presents to the clinic today for:     pt states her health is not very good right now, she was in the hospital for a fall where she broke her ankle  Referring physician: Shon Baton, MD [place],  [place] [address]  HISTORICAL INFORMATION:   Selected notes from the [facility] Referred by Dr. Monna Fam for concern of unusual large drusenoid, epi detachement LEE: 11.11.19 (K. Hecker) [BCVA: OD: 20/25++ OS: 20/20--] Ocular Hx-cataracts OU, glaucoma OU PMH-anemia, anxiety, arthritis, asthma, CKD, CAD, depression, DM(takes Novolin and glipizide), HLD, HTN,stroke    CURRENT MEDICATIONS: No current outpatient medications on file. (Ophthalmic Drugs)   No current facility-administered medications for this visit. (Ophthalmic Drugs)   Current Outpatient Medications (Other)  Medication Sig   acetaminophen (TYLENOL) 325 MG tablet Take 2 tablets (650 mg total) by mouth every 6 (six) hours as needed for up to 30 doses for mild pain or moderate pain.   albuterol (VENTOLIN HFA) 108 (90 Base) MCG/ACT inhaler INHALE 2 INHALATIONS 3 TIMES A DAY AS NEEDED FOR COUGH/WHEEZE   busPIRone (BUSPAR) 10 MG tablet Take 1 tablet (10 mg total) by mouth every morning.   Cholecalciferol (VITAMIN D-3) 25 MCG (1000 UT) CAPS Take 2,000 Units by mouth every morning.   clonazePAM (KLONOPIN) 0.5 MG tablet Take 0.5-1 mg by mouth See admin instructions. Takes '1mg'$  in am and 0.'5mg'$  in pm.   clopidogrel (PLAVIX) 75 MG tablet Take 75 mg by mouth every morning.   dicyclomine (BENTYL) 20 MG tablet Take 40 mg by mouth in the morning and at bedtime.   donepezil (ARICEPT) 10 MG tablet TAKE 1 TABLET BY MOUTH EVERYDAY AT BEDTIME   esomeprazole (NEXIUM) 40 MG capsule Take 40 mg by mouth daily.    furosemide (LASIX) 20 MG tablet TAKE 1 TABLET (20 MG TOTAL) BY MOUTH DAILY AS NEEDED FOR FLUID OR EDEMA. AS DIRECTED   glipiZIDE (GLUCOTROL) 10 MG tablet Take 20 mg by mouth 2 (two) times daily before a meal.    guaiFENesin (MUCINEX) 600 MG 12 hr tablet Take 600 mg by mouth 2 (two) times daily as needed for cough.   insulin NPH (HUMULIN N,NOVOLIN N) 100 UNIT/ML injection Inject 5-20 Units into the skin See admin instructions. In the morning:  If cbg >300, then give 20 units. If cbg < or = 300, then give 15 unit At night: If cbg >250, then give 5 units. If cbg < or = 250, then give no insulin   insulin regular (NOVOLIN R,HUMULIN R) 100 units/mL injection Inject 0-30 Units into the skin See admin instructions. At breakfast: if cbg < 300, then give 25 units. If cbg > or = 300, then give 30 units. After dinner: goal cbgs is 150 and give insulin dose to reach this. For every 100 cbg over 150, then give 10 units of insulin.   levothyroxine (SYNTHROID) 100 MCG tablet Take 100 mcg by mouth daily before breakfast.   lidocaine (LIDODERM) 5 % Place 2 patches onto the skin daily as needed (pain). Remove & Discard patch within 12 hours or as directed by MD   linaclotide (LINZESS) 290 MCG CAPS capsule Take 290 mcg by mouth daily before breakfast.   Menthol-Methyl Salicylate (MUSCLE RUB) 10-15 % CREA  Apply 1 application topically as needed for muscle pain.   nortriptyline (PAMELOR) 50 MG capsule Take 1 capsule (50 mg total) by mouth 2 (two) times daily.   OXYGEN Inhale 2 L into the lungs as needed. Used at overnight with a concentrator   pantoprazole (PROTONIX) 40 MG tablet TAKE 1 TABLET BY MOUTH EVERY DAY   potassium chloride SA (KLOR-CON M) 20 MEQ tablet Take 20 mEq by mouth 2 (two) times daily. Take 1/2 tablet twice daily.   rosuvastatin (CRESTOR) 20 MG tablet Take 20 mg by mouth daily.    solifenacin (VESICARE) 5 MG tablet Take 5 mg by mouth daily as needed (to prevent UTI).   SYMBICORT 80-4.5 MCG/ACT inhaler  INHALE 2 PUFFS FIRST THING IN THE MORNING THEN ANOTHER 2 PUFFS ABOUT 12 HOURS LATER   terconazole (TERAZOL 7) 0.4 % vaginal cream Place 1 applicator vaginally daily as needed for itching.   traMADol (ULTRAM) 50 MG tablet Take 1 tablet (50 mg total) by mouth every 12 (twelve) hours as needed for up to 20 doses.   No current facility-administered medications for this visit. (Other)    REVIEW OF SYSTEMS:    ALLERGIES Allergies  Allergen Reactions   Iodine Other (See Comments)    ONLY IV--Makes unconscious   Iohexol Other (See Comments)     Code: SOB, Desc: cardiac arrest w/ iv contrast, has never used 13 hr prep//alice calhoun, Onset Date: [id number]    Metoclopramide Hcl Other (See Comments)    suicidal   Sulfa Antibiotics Hives   Lactose Intolerance (Gi) Diarrhea   Lansoprazole Nausea And Vomiting    PAST MEDICAL HISTORY Past Medical History:  Diagnosis Date   Anemia    Anxiety    Arthritis    Asthma    Brittle bone disease    CKD (chronic kidney disease)    Complication of anesthesia    hard to wake up after anesthesia, trouble turning head   Coronary artery disease, non-occlusive 2014   a. minimal CAD by cath in 2014 with 40% RI stenosis. b. low-risk NST in 11/2014.   Depression    Diabetes mellitus type 2, insulin dependent (HCC)    Diabetic neuropathy (HCC)    Diabetic retinopathy ([place])    NPDR OU   Diastolic dysfunction, left ventricle 11/30/14   EF 55%, grade 1 DD   DVT (deep venous thrombosis) ([place])    "18 in RLE; 7 LLE prior to PE" (03/24/2015)   Family history of adverse reaction to anesthesia    " the whole family is hard to wake up"   Glaucoma    H/O hiatal hernia    Hyperlipidemia    Hypertension associated with diabetes ([place])    Hypertensive retinopathy    OU   Hypothyroidism    Kidney stones    "passed them"   Macular degeneration    Exu OU   Memory loss 06/03/2014   On home oxygen therapy    "2L oxygen concentrator @ night"   Osteoporosis     Oxygen desaturation during sleep    wears 2 liters of oxygen at night    Palpitations 35years ago   Cardionet monitor - revealed mostly normal sinus rhythm, sinus bradycardia with first-degree A-V block heart rates mostly in the 50s and 60s with some 70s. No arrhythmias, PVCs or PACs noted.   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 s/p [employer] PPM in 03/2015 by Dr. Lovena Le secondary to sinus node dysfunction.    TIA (transient ischemic attack) March 2014   Past Surgical History:  Procedure Laterality Date   ABDOMINAL HYSTERECTOMY     APPENDECTOMY     BACK SURGERY     steroid inj   CATARACT EXTRACTION Bilateral    CATARACT EXTRACTION W/ INTRAOCULAR LENS  IMPLANT, BILATERAL Bilateral 2000s   CHOLECYSTECTOMY     EP IMPLANTABLE DEVICE N/A 03/24/2015   [facility] PPM, Dr. Lovena Le   ESOPHAGOGASTRODUODENOSCOPY  08/09/2011   Procedure: ESOPHAGOGASTRODUODENOSCOPY (EGD);  Surgeon: Jeryl Columbia, MD;  Location: [facility];  Service: Endoscopy;  Laterality: N/A;   ESOPHAGOGASTRODUODENOSCOPY (EGD) WITH PROPOFOL N/A 11/12/2013   Procedure: ESOPHAGOGASTRODUODENOSCOPY (EGD) WITH PROPOFOL;  Surgeon: Jeryl Columbia, MD;  Location: [facility] ENDOSCOPY;  Service: Endoscopy;  Laterality: N/A;   ESOPHAGOGASTRODUODENOSCOPY (EGD) WITH PROPOFOL N/A 05/05/2016   Procedure: ESOPHAGOGASTRODUODENOSCOPY (EGD) WITH PROPOFOL;  Surgeon: Clarene Essex, MD;  Location[facility];  Service: Endoscopy;  Laterality: N/A;   EYE SURGERY Bilateral    Cat Sx OU   GLAUCOMA SURGERY  Bilateral 2000s   "laser"   HOT HEMOSTASIS  08/09/2011   Procedure: HOT HEMOSTASIS (ARGON PLASMA COAGULATION/BICAP);  Surgeon: Jeryl Columbia, MD;  Location: [facility];  Service: Endoscopy;  Laterality: N/A;   HOT HEMOSTASIS N/A 11/12/2013   Procedure: HOT HEMOSTASIS (ARGON PLASMA COAGULATION/BICAP);  Surgeon: Jeryl Columbia, MD;  Location: [facility];  Service: Endoscopy;  Laterality: N/A;   INSERT / REPLACE / REMOVE PACEMAKER  03/24/2015   IRIDOTOMY / IRIDECTOMY Bilateral    JOINT REPLACEMENT     KNEE SURGERY Left done with 2nd left knee replacement   spacer bar placement   LEFT HEART CATHETERIZATION WITH CORONARY ANGIOGRAM N/A 02/14/2012   Procedure: LEFT HEART CATHETERIZATION WITH CORONARY ANGIOGRAM;  Surgeon: Leonie Man, MD;  [facility];  Service: Cardiovascular:: no evidence of obstructive coronary disease ,low EDP with normal EF   LEV DOPPLER  05/29/2012   RIGHT EXTREM. NORMAL VENOUS DUPLEX   NM MYOCAR PERF WALL MOTION  08/2019   EF 55 to 65%.  No ischemia or infarction.  LOW RISK:   PACEMAKER PLACEMENT     REVISION TOTAL KNEE ARTHROPLASTY Left    TONSILLECTOMY     TOTAL KNEE ARTHROPLASTY Bilateral    TRANSTHORACIC ECHOCARDIOGRAM  11/2017   11/2017: EF 60-65%. Gr1 DD. PAP ~33 mmHg.  Mild TR. PPM leads in RA & RV.    TRIGGER FINGER RELEASE  11/22/2011   Procedure: RELEASE TRIGGER FINGER/A-1 PULLEY;  Surgeon: Meredith Pel, MD;  Location: [facility];  Service: Orthopedics;  Laterality: Left;  Left trigger thumb release   TRIGGER FINGER RELEASE Right yrs ago    FAMILY HISTORY Family History  Problem Relation Age of Onset   Cancer Mother    Diabetes Mother    Cancer - Prostate Father    Diabetes Father    Retinal detachment Son    Diabetes  Sister    Diabetes Brother    Diabetes Paternal Grandmother     SOCIAL HISTORY Social History   Tobacco Use   Smoking status: Never    Passive exposure: Yes   Smokeless tobacco: Never  Vaping Use   Vaping Use: Never  used  Substance Use Topics   Alcohol use: No    Alcohol/week: 0.0 standard drinks   Drug use: No         OPHTHALMIC EXAM:  Not recorded     IMAGING AND PROCEDURES  Imaging and Procedures for '@TODAY'$ @          ASSESSMENT/PLAN:    ICD-10-CM   1. 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 Intermediate stage nonexudative age-related macular degeneration of both eyes  H35.3132     3. Mild nonproliferative diabetic retinopathy of both eyes without macular edema associated with type 2 diabetes mellitus ([place])  D14.9702     4. Essential hypertension  I10     5. Hypertensive retinopathy of both eyes  H35.033     6. Pseudophakia of both eyes  Z96.1     7. Fuchs' corneal dystrophy of both eyes  H18.513     8. Diplopia  H53.2         1-2. 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 Mild nonproliferative diabetic retinopathy w/o DME, both eyes  - exam with scattered MA, IRH OU  - FA shows mild MA; no NV  - OCT without macular edema, both eyes   4,5. Hypertensive retinopathy OU  - discussed importance of tight BP control  - monitor   6. Pseudophakia OU  - s/p CE/IOL  - beautiful surgeries, doing well  - monitor   7. Fuch's endothelial dystrophy  - mild guttata OU, no edema   8. Horizontal binocular diplopia  - mild XT noted on exam  - managed by Dr. Herbert Deaner -- prism prescribed  - pt reports improvement in diplopia  w/ glasses but states that she doesn't wear them that much    Ophthalmic Meds Ordered this visit:  No orders of the defined types were placed in this encounter.       No follow-ups on file.  There are no Patient Instructions on file for this visit.   This document serves as a record of services personally performed by Gardiner Sleeper, MD, PhD. It was created on their behalf by Leonie Douglas, an ophthalmic technician. The creation of this record is the provider's dictation and/or activities during the visit.    Electronically signed by: Leonie Douglas COA, 06/30/21  10:49 AM   Gardiner Sleeper, M.D., Ph.D. Diseases & Surgery of the Retina and Vitreous [facility]: M myopia (nearsighted); A astigmatism; H hyperopia (farsighted); P presbyopia; Mrx spectacle prescription;  CTL contact lenses; OD right  eye; OS left eye; OU both eyes  XT exotropia; ET esotropia; PEK punctate epithelial keratitis; PEE punctate epithelial erosions; DES dry eye syndrome; MGD meibomian gland dysfunction; ATs artificial tears; PFAT's preservative free artificial tears; [place] nuclear sclerotic cataract; PSC posterior subcapsular cataract; ERM epi-retinal membrane; PVD posterior vitreous detachment; RD retinal detachment; DM diabetes mellitus; DR diabetic retinopathy; NPDR non-proliferative diabetic retinopathy; PDR proliferative diabetic retinopathy; CSME clinically significant macular edema; DME diabetic macular edema; dbh dot blot hemorrhages; CWS cotton wool spot; POAG primary open angle glaucoma; C/D cup-to-disc ratio; HVF humphrey visual field; GVF goldmann visual field; OCT optical coherence tomography; IOP intraocular pressure; BRVO Branch retinal vein occlusion; CRVO central retinal vein occlusion; CRAO central retinal artery occlusion; BRAO branch retinal artery occlusion; RT retinal tear; SB scleral buckle; PPV pars plana vitrectomy; VH Vitreous hemorrhage; PRP panretinal  laser photocoagulation; IVK intravitreal kenalog; VMT vitreomacular traction; MH Macular hole;  NVD neovascularization of the disc; NVE neovascularization elsewhere; AREDS age related eye disease study; ARMD age related macular degeneration; POAG primary open angle glaucoma; EBMD epithelial/anterior basement membrane dystrophy; ACIOL anterior chamber intraocular lens; IOL intraocular lens; PCIOL posterior chamber intraocular lens; Phaco/IOL phacoemulsification with intraocular lens placement; [place] photorefractive keratectomy; LASIK laser assisted in situ keratomileusis; HTN hypertension; DM diabetes mellitus; COPD chronic obstructive pulmonary disease

## 2021-07-02 ENCOUNTER — Encounter (INDEPENDENT_AMBULATORY_CARE_PROVIDER_SITE_OTHER): Payer: Medicare HMO | Admitting: Ophthalmology

## 2021-07-02 DIAGNOSIS — H353132 Nonexudative age-related macular degeneration, bilateral, intermediate dry stage: Secondary | ICD-10-CM

## 2021-07-02 DIAGNOSIS — E113293 Type 2 diabetes mellitus with mild nonproliferative diabetic retinopathy without macular edema, bilateral: Secondary | ICD-10-CM

## 2021-07-02 DIAGNOSIS — I10 Essential (primary) hypertension: Secondary | ICD-10-CM

## 2021-07-02 DIAGNOSIS — H35033 Hypertensive retinopathy, bilateral: Secondary | ICD-10-CM

## 2021-07-02 DIAGNOSIS — H35723 Serous detachment of retinal pigment epithelium, bilateral: Secondary | ICD-10-CM

## 2021-07-02 DIAGNOSIS — H18513 Endothelial corneal dystrophy, bilateral: Secondary | ICD-10-CM

## 2021-07-02 DIAGNOSIS — H532 Diplopia: Secondary | ICD-10-CM

## 2021-07-02 DIAGNOSIS — Z961 Presence of intraocular lens: Secondary | ICD-10-CM

## 2021-07-06 NOTE — Progress Notes (Shared)
[facility] Note  07/09/2021     CHIEF COMPLAINT Patient presents for No chief complaint on file.    HISTORY OF PRESENT ILLNESS: Tara Jackson is a 81 y.o. female who presents to the clinic today for:     pt states her health is not very good right now, she was in the hospital for a fall where she broke her ankle  Referring physician: Shon Baton, MD [place],  [place] [address]  HISTORICAL INFORMATION:   Selected notes from the [facility] Referred by Dr. Monna Fam for concern of unusual large drusenoid, epi detachement LEE: 11.11.19 (K. Hecker) [BCVA: OD: 20/25++ OS: 20/20--] Ocular Hx-cataracts OU, glaucoma OU PMH-anemia, anxiety, arthritis, asthma, CKD, CAD, depression, DM(takes Novolin and glipizide), HLD, HTN,stroke    CURRENT MEDICATIONS: No current outpatient medications on file. (Ophthalmic Drugs)   No current facility-administered medications for this visit. (Ophthalmic Drugs)   Current Outpatient Medications (Other)  Medication Sig   acetaminophen (TYLENOL) 325 MG tablet Take 2 tablets (650 mg total) by mouth every 6 (six) hours as needed for up to 30 doses for mild pain or moderate pain.   albuterol (VENTOLIN HFA) 108 (90 Base) MCG/ACT inhaler INHALE 2 INHALATIONS 3 TIMES A DAY AS NEEDED FOR COUGH/WHEEZE   busPIRone (BUSPAR) 10 MG tablet Take 1 tablet (10 mg total) by mouth every morning.   Cholecalciferol (VITAMIN D-3) 25 MCG (1000 UT) CAPS Take 2,000 Units by mouth every morning.   clonazePAM (KLONOPIN) 0.5 MG tablet Take 0.5-1 mg by mouth See admin instructions. Takes '1mg'$  in am and 0.'5mg'$  in pm.   clopidogrel (PLAVIX) 75 MG tablet Take 75 mg by mouth every morning.   dicyclomine (BENTYL) 20 MG tablet Take 40 mg by mouth in the morning and at bedtime.   donepezil (ARICEPT) 10 MG tablet TAKE 1 TABLET BY MOUTH EVERYDAY AT BEDTIME   esomeprazole (NEXIUM) 40 MG capsule Take 40 mg by mouth daily.    furosemide (LASIX) 20 MG tablet TAKE 1 TABLET (20 MG TOTAL) BY MOUTH DAILY AS NEEDED FOR FLUID OR EDEMA. AS DIRECTED   glipiZIDE (GLUCOTROL) 10 MG tablet Take 20 mg by mouth 2 (two) times daily before a meal.    guaiFENesin (MUCINEX) 600 MG 12 hr tablet Take 600 mg by mouth 2 (two) times daily as needed for cough.   insulin NPH (HUMULIN N,NOVOLIN N) 100 UNIT/ML injection Inject 5-20 Units into the skin See admin instructions. In the morning:  If cbg >300, then give 20 units. If cbg < or = 300, then give 15 unit At night: If cbg >250, then give 5 units. If cbg < or = 250, then give no insulin   insulin regular (NOVOLIN R,HUMULIN R) 100 units/mL injection Inject 0-30 Units into the skin See admin instructions. At breakfast: if cbg < 300, then give 25 units. If cbg > or = 300, then give 30 units. After dinner: goal cbgs is 150 and give insulin dose to reach this. For every 100 cbg over 150, then give 10 units of insulin.   levothyroxine (SYNTHROID) 100 MCG tablet Take 100 mcg by mouth daily before breakfast.   lidocaine (LIDODERM) 5 % Place 2 patches onto the skin daily as needed (pain). Remove & Discard patch within 12 hours or as directed by MD   linaclotide (LINZESS) 290 MCG CAPS capsule Take 290 mcg by mouth daily before breakfast.   Menthol-Methyl Salicylate (MUSCLE RUB) 10-15 % CREA  Apply 1 application topically as needed for muscle pain.   nortriptyline (PAMELOR) 50 MG capsule Take 1 capsule (50 mg total) by mouth 2 (two) times daily.   OXYGEN Inhale 2 L into the lungs as needed. Used at overnight with a concentrator   pantoprazole (PROTONIX) 40 MG tablet TAKE 1 TABLET BY MOUTH EVERY DAY   potassium chloride SA (KLOR-CON M) 20 MEQ tablet Take 20 mEq by mouth 2 (two) times daily. Take 1/2 tablet twice daily.   rosuvastatin (CRESTOR) 20 MG tablet Take 20 mg by mouth daily.    solifenacin (VESICARE) 5 MG tablet Take 5 mg by mouth daily as needed (to prevent UTI).   SYMBICORT 80-4.5 MCG/ACT inhaler  INHALE 2 PUFFS FIRST THING IN THE MORNING THEN ANOTHER 2 PUFFS ABOUT 12 HOURS LATER   terconazole (TERAZOL 7) 0.4 % vaginal cream Place 1 applicator vaginally daily as needed for itching.   traMADol (ULTRAM) 50 MG tablet Take 1 tablet (50 mg total) by mouth every 12 (twelve) hours as needed for up to 20 doses.   No current facility-administered medications for this visit. (Other)    REVIEW OF SYSTEMS:    ALLERGIES Allergies  Allergen Reactions   Iodine Other (See Comments)    ONLY IV--Makes unconscious   Iohexol Other (See Comments)     Code: SOB, Desc: cardiac arrest w/ iv contrast, has never used 13 hr prep//alice calhoun, Onset Date: [id number]    Metoclopramide Hcl Other (See Comments)    suicidal   Sulfa Antibiotics Hives   Lactose Intolerance (Gi) Diarrhea   Lansoprazole Nausea And Vomiting    PAST MEDICAL HISTORY Past Medical History:  Diagnosis Date   Anemia    Anxiety    Arthritis    Asthma    Brittle bone disease    CKD (chronic kidney disease)    Complication of anesthesia    hard to wake up after anesthesia, trouble turning head   Coronary artery disease, non-occlusive 2014   a. minimal CAD by cath in 2014 with 40% RI stenosis. b. low-risk NST in 11/2014.   Depression    Diabetes mellitus type 2, insulin dependent (HCC)    Diabetic neuropathy (HCC)    Diabetic retinopathy ([place])    NPDR OU   Diastolic dysfunction, left ventricle 11/30/14   EF 55%, grade 1 DD   DVT (deep venous thrombosis) ([place])    "18 in RLE; 7 LLE prior to PE" (03/24/2015)   Family history of adverse reaction to anesthesia    " the whole family is hard to wake up"   Glaucoma    H/O hiatal hernia    Hyperlipidemia    Hypertension associated with diabetes ([place])    Hypertensive retinopathy    OU   Hypothyroidism    Kidney stones    "passed them"   Macular degeneration    Exu OU   Memory loss 06/03/2014   On home oxygen therapy    "2L oxygen concentrator @ night"   Osteoporosis     Oxygen desaturation during sleep    wears 2 liters of oxygen at night    Palpitations 35years ago   Cardionet monitor - revealed mostly normal sinus rhythm, sinus bradycardia with first-degree A-V block heart rates mostly in the 50s and 60s with some 70s. No arrhythmias, PVCs or PACs noted.   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 s/p [employer] PPM in 03/2015 by Dr. Lovena Le secondary to sinus node dysfunction.    TIA (transient ischemic attack) March 2014   Past Surgical History:  Procedure Laterality Date   ABDOMINAL HYSTERECTOMY     APPENDECTOMY     BACK SURGERY     steroid inj   CATARACT EXTRACTION Bilateral    CATARACT EXTRACTION W/ INTRAOCULAR LENS  IMPLANT, BILATERAL Bilateral 2000s   CHOLECYSTECTOMY     EP IMPLANTABLE DEVICE N/A 03/24/2015   [facility] [employer] PPM, Dr. Lovena Le   ESOPHAGOGASTRODUODENOSCOPY  08/09/2011   Procedure: ESOPHAGOGASTRODUODENOSCOPY (EGD);  Surgeon: Jeryl Columbia, MD;  Location: [facility];  Service: Endoscopy;  Laterality: N/A;   ESOPHAGOGASTRODUODENOSCOPY (EGD) WITH PROPOFOL N/A 11/12/2013   Procedure: ESOPHAGOGASTRODUODENOSCOPY (EGD) WITH PROPOFOL;  Surgeon: Jeryl Columbia, MD;  Location: [facility] ENDOSCOPY;  Service: Endoscopy;  Laterality: N/A;   ESOPHAGOGASTRODUODENOSCOPY (EGD) WITH PROPOFOL N/A 05/05/2016   Procedure: ESOPHAGOGASTRODUODENOSCOPY (EGD) WITH PROPOFOL;  Surgeon: Clarene Essex, MD;  Location: [facility] ENDOSCOPY;  Service: Endoscopy;  Laterality: N/A;   EYE SURGERY Bilateral    Cat Sx OU   GLAUCOMA SURGERY  Bilateral 2000s   "laser"   HOT HEMOSTASIS  08/09/2011   Procedure: HOT HEMOSTASIS (ARGON PLASMA COAGULATION/BICAP);  Surgeon: Jeryl Columbia, MD;  Location: [facility];  Service: Endoscopy;  Laterality: N/A;   HOT HEMOSTASIS N/A 11/12/2013   Procedure: HOT HEMOSTASIS (ARGON PLASMA COAGULATION/BICAP);  Surgeon: Jeryl Columbia, MD;  Location: [facility];  Service: Endoscopy;  Laterality: N/A;   INSERT / REPLACE / REMOVE PACEMAKER  03/24/2015   IRIDOTOMY / IRIDECTOMY Bilateral    JOINT REPLACEMENT     KNEE SURGERY Left done with 2nd left knee replacement   spacer bar placement   LEFT HEART CATHETERIZATION WITH CORONARY ANGIOGRAM N/A 02/14/2012   Procedure: LEFT HEART CATHETERIZATION WITH CORONARY ANGIOGRAM;  Surgeon: Leonie Man, MD;  Location: [facility] CATH LAB;  Service: Cardiovascular:: no evidence of obstructive coronary disease ,low EDP with normal EF   LEV DOPPLER  05/29/2012   RIGHT EXTREM. NORMAL VENOUS DUPLEX   NM MYOCAR PERF WALL MOTION  08/2019   EF 55 to 65%.  No ischemia or infarction.  LOW RISK:   PACEMAKER PLACEMENT     REVISION TOTAL KNEE ARTHROPLASTY Left    TONSILLECTOMY     TOTAL KNEE ARTHROPLASTY Bilateral    TRANSTHORACIC ECHOCARDIOGRAM  11/2017   11/2017: EF 60-65%. Gr1 DD. PAP ~33 mmHg.  Mild TR. PPM leads in RA & RV.    TRIGGER FINGER RELEASE  11/22/2011   Procedure: RELEASE TRIGGER FINGER/A-1 PULLEY;  Surgeon: Meredith Pel, MD;  Location: [facility];  Service: Orthopedics;  Laterality: Left;  Left trigger thumb release   TRIGGER FINGER RELEASE Right yrs ago    FAMILY HISTORY Family History  Problem Relation Age of Onset   Cancer Mother    Diabetes Mother    Cancer - Prostate Father    Diabetes Father    Retinal detachment Son    Diabetes  Sister    Diabetes Brother    Diabetes Paternal Grandmother     SOCIAL HISTORY Social History   Tobacco Use   Smoking status: Never    Passive exposure: Yes   Smokeless tobacco: Never  Vaping Use   Vaping Use: Never  used  Substance Use Topics   Alcohol use: No    Alcohol/week: 0.0 standard drinks   Drug use: No         OPHTHALMIC EXAM:  Not recorded     IMAGING AND PROCEDURES  Imaging and Procedures for '@TODAY'$ @          ASSESSMENT/PLAN:    ICD-10-CM   1. 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 Intermediate stage nonexudative age-related macular degeneration of both eyes  H35.3132     3. 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 Essential hypertension  I10     5. Hypertensive retinopathy of both eyes  H35.033     6. Pseudophakia of both eyes  Z96.1     7. Fuchs' corneal dystrophy of both eyes  H18.513     8. Diplopia  H53.2         1,2. 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 Mild nonproliferative diabetic retinopathy w/o DME, both eyes  - exam with scattered MA, IRH OU  - FA shows mild MA; no NV  - OCT without macular edema, both eyes  4,5. Hypertensive retinopathy OU  - discussed importance of tight BP control  - monitor   6. Pseudophakia OU  - s/p CE/IOL  - beautiful surgeries, doing well  - monitor   7. Fuch's endothelial dystrophy  - mild guttata OU, no edema  8. Horizontal binocular diplopia  - mild XT noted on exam  - managed by Dr. Herbert Deaner -- prism prescribed  - pt reports improvement in diplopia w/  glasses but states that she doesn't wear them that much   Ophthalmic Meds Ordered this visit:  No orders of the defined types were placed in this encounter.       No follow-ups on file.  There are no Patient Instructions on file for this visit.   This document serves as a record of services personally performed by Gardiner Sleeper, MD, PhD. It was created on their behalf by Leonie Douglas, an ophthalmic technician. The creation of this record is the provider's dictation and/or activities during the visit.    Electronically signed by: Leonie Douglas COA, 07/06/21  12:29 PM   Gardiner Sleeper, M.D., Ph.D. Diseases & Surgery of the Retina and Vitreous [facility]: M myopia (nearsighted); A astigmatism; H hyperopia (farsighted); P presbyopia; Mrx spectacle prescription;  CTL contact lenses; OD right eye; OS left eye;  OU both eyes  XT exotropia; ET esotropia; PEK punctate epithelial keratitis; PEE punctate epithelial erosions; DES dry eye syndrome; MGD meibomian gland dysfunction; ATs artificial tears; PFAT's preservative free artificial tears; [place] nuclear sclerotic cataract; PSC posterior subcapsular cataract; ERM epi-retinal membrane; PVD posterior vitreous detachment; RD retinal detachment; DM diabetes mellitus; DR diabetic retinopathy; NPDR non-proliferative diabetic retinopathy; PDR proliferative diabetic retinopathy; CSME clinically significant macular edema; DME diabetic macular edema; dbh dot blot hemorrhages; CWS cotton wool spot; POAG primary open angle glaucoma; C/D cup-to-disc ratio; HVF humphrey visual field; GVF goldmann visual field; OCT optical coherence tomography; IOP intraocular pressure; BRVO Branch retinal vein occlusion; CRVO central retinal vein occlusion; CRAO central retinal artery occlusion; BRAO branch retinal artery occlusion; RT retinal tear; SB scleral buckle; PPV pars plana vitrectomy; VH Vitreous hemorrhage; PRP panretinal laser  photocoagulation; IVK intravitreal kenalog; VMT vitreomacular traction; MH Macular hole;  NVD neovascularization of the disc; NVE neovascularization elsewhere; AREDS age related eye disease study; ARMD age related macular degeneration; POAG primary open angle glaucoma; EBMD epithelial/anterior basement membrane dystrophy; ACIOL anterior chamber intraocular lens; IOL intraocular lens; PCIOL posterior chamber intraocular lens; Phaco/IOL phacoemulsification with intraocular lens placement; [place] photorefractive keratectomy; LASIK laser assisted in situ keratomileusis; HTN hypertension; DM diabetes mellitus; COPD chronic obstructive pulmonary disease

## 2021-07-09 ENCOUNTER — Encounter (INDEPENDENT_AMBULATORY_CARE_PROVIDER_SITE_OTHER): Payer: Medicare HMO | Admitting: Ophthalmology

## 2021-07-09 DIAGNOSIS — H353132 Nonexudative age-related macular degeneration, bilateral, intermediate dry stage: Secondary | ICD-10-CM

## 2021-07-09 DIAGNOSIS — H35033 Hypertensive retinopathy, bilateral: Secondary | ICD-10-CM

## 2021-07-09 DIAGNOSIS — I10 Essential (primary) hypertension: Secondary | ICD-10-CM

## 2021-07-09 DIAGNOSIS — H18513 Endothelial corneal dystrophy, bilateral: Secondary | ICD-10-CM

## 2021-07-09 DIAGNOSIS — H35723 Serous detachment of retinal pigment epithelium, bilateral: Secondary | ICD-10-CM

## 2021-07-09 DIAGNOSIS — H532 Diplopia: Secondary | ICD-10-CM

## 2021-07-09 DIAGNOSIS — Z961 Presence of intraocular lens: Secondary | ICD-10-CM

## 2021-07-09 DIAGNOSIS — E113293 Type 2 diabetes mellitus with mild nonproliferative diabetic retinopathy without macular edema, bilateral: Secondary | ICD-10-CM

## 2021-07-14 NOTE — Progress Notes (Shared)
[facility] Note  07/16/2021     CHIEF COMPLAINT Patient presents for No chief complaint on file.    HISTORY OF PRESENT ILLNESS: Tara Jackson is a 81 y.o. female who presents to the clinic today for:     pt states her health is not very good right now, she was in the hospital for a fall where she broke her ankle  Referring physician: Shon Baton, MD [place],  [place] [address]  HISTORICAL INFORMATION:   Selected notes from the [facility] Referred by Dr. Monna Fam for concern of unusual large drusenoid, epi detachement LEE: 11.11.19 (K. Hecker) [BCVA: OD: 20/25++ OS: 20/20--] Ocular Hx-cataracts OU, glaucoma OU PMH-anemia, anxiety, arthritis, asthma, CKD, CAD, depression, DM(takes Novolin and glipizide), HLD, HTN,stroke    CURRENT MEDICATIONS: No current outpatient medications on file. (Ophthalmic Drugs)   No current facility-administered medications for this visit. (Ophthalmic Drugs)   Current Outpatient Medications (Other)  Medication Sig   acetaminophen (TYLENOL) 325 MG tablet Take 2 tablets (650 mg total) by mouth every 6 (six) hours as needed for up to 30 doses for mild pain or moderate pain.   albuterol (VENTOLIN HFA) 108 (90 Base) MCG/ACT inhaler INHALE 2 INHALATIONS 3 TIMES A DAY AS NEEDED FOR COUGH/WHEEZE   busPIRone (BUSPAR) 10 MG tablet Take 1 tablet (10 mg total) by mouth every morning.   Cholecalciferol (VITAMIN D-3) 25 MCG (1000 UT) CAPS Take 2,000 Units by mouth every morning.   clonazePAM (KLONOPIN) 0.5 MG tablet Take 0.5-1 mg by mouth See admin instructions. Takes '1mg'$  in am and 0.'5mg'$  in pm.   clopidogrel (PLAVIX) 75 MG tablet Take 75 mg by mouth every morning.   dicyclomine (BENTYL) 20 MG tablet Take 40 mg by mouth in the morning and at bedtime.   donepezil (ARICEPT) 10 MG tablet TAKE 1 TABLET BY MOUTH EVERYDAY AT BEDTIME   esomeprazole (NEXIUM) 40 MG capsule Take 40 mg by mouth daily.    furosemide (LASIX) 20 MG tablet TAKE 1 TABLET (20 MG TOTAL) BY MOUTH DAILY AS NEEDED FOR FLUID OR EDEMA. AS DIRECTED   glipiZIDE (GLUCOTROL) 10 MG tablet Take 20 mg by mouth 2 (two) times daily before a meal.    guaiFENesin (MUCINEX) 600 MG 12 hr tablet Take 600 mg by mouth 2 (two) times daily as needed for cough.   insulin NPH (HUMULIN N,NOVOLIN N) 100 UNIT/ML injection Inject 5-20 Units into the skin See admin instructions. In the morning:  If cbg >300, then give 20 units. If cbg < or = 300, then give 15 unit At night: If cbg >250, then give 5 units. If cbg < or = 250, then give no insulin   insulin regular (NOVOLIN R,HUMULIN R) 100 units/mL injection Inject 0-30 Units into the skin See admin instructions. At breakfast: if cbg < 300, then give 25 units. If cbg > or = 300, then give 30 units. After dinner: goal cbgs is 150 and give insulin dose to reach this. For every 100 cbg over 150, then give 10 units of insulin.   levothyroxine (SYNTHROID) 100 MCG tablet Take 100 mcg by mouth daily before breakfast.   lidocaine (LIDODERM) 5 % Place 2 patches onto the skin daily as needed (pain). Remove & Discard patch within 12 hours or as directed by MD   linaclotide (LINZESS) 290 MCG CAPS capsule Take 290 mcg by mouth daily before breakfast.   Menthol-Methyl Salicylate (MUSCLE RUB) 10-15 % CREA  Apply 1 application topically as needed for muscle pain.   nortriptyline (PAMELOR) 50 MG capsule Take 1 capsule (50 mg total) by mouth 2 (two) times daily.   OXYGEN Inhale 2 L into the lungs as needed. Used at overnight with a concentrator   pantoprazole (PROTONIX) 40 MG tablet TAKE 1 TABLET BY MOUTH EVERY DAY   potassium chloride SA (KLOR-CON M) 20 MEQ tablet Take 20 mEq by mouth 2 (two) times daily. Take 1/2 tablet twice daily.   rosuvastatin (CRESTOR) 20 MG tablet Take 20 mg by mouth daily.    solifenacin (VESICARE) 5 MG tablet Take 5 mg by mouth daily as needed (to prevent UTI).   SYMBICORT 80-4.5 MCG/ACT inhaler  INHALE 2 PUFFS FIRST THING IN THE MORNING THEN ANOTHER 2 PUFFS ABOUT 12 HOURS LATER   terconazole (TERAZOL 7) 0.4 % vaginal cream Place 1 applicator vaginally daily as needed for itching.   traMADol (ULTRAM) 50 MG tablet Take 1 tablet (50 mg total) by mouth every 12 (twelve) hours as needed for up to 20 doses.   No current facility-administered medications for this visit. (Other)    REVIEW OF SYSTEMS:    ALLERGIES Allergies  Allergen Reactions   Iodine Other (See Comments)    ONLY IV--Makes unconscious   Iohexol Other (See Comments)     Code: SOB, Desc: cardiac arrest w/ iv contrast, has never used 13 hr prep//alice calhoun, Onset Date: [id number]    Metoclopramide Hcl Other (See Comments)    suicidal   Sulfa Antibiotics Hives   Lactose Intolerance (Gi) Diarrhea   Lansoprazole Nausea And Vomiting    PAST MEDICAL HISTORY Past Medical History:  Diagnosis Date   Anemia    Anxiety    Arthritis    Asthma    Brittle bone disease    CKD (chronic kidney disease)    Complication of anesthesia    hard to wake up after anesthesia, trouble turning head   Coronary artery disease, non-occlusive 2014   a. minimal CAD by cath in 2014 with 40% RI stenosis. b. low-risk NST in 11/2014.   Depression    Diabetes mellitus type 2, insulin dependent (HCC)    Diabetic neuropathy (HCC)    Diabetic retinopathy ([place])    NPDR OU   Diastolic dysfunction, left ventricle 11/30/14   EF 55%, grade 1 DD   DVT (deep venous thrombosis) ([place])    "18 in RLE; 7 LLE prior to PE" (03/24/2015)   Family history of adverse reaction to anesthesia    " the whole family is hard to wake up"   Glaucoma    H/O hiatal hernia    Hyperlipidemia    Hypertension associated with diabetes ([place])    Hypertensive retinopathy    OU   Hypothyroidism    Kidney stones    "passed them"   Macular degeneration    Exu OU   Memory loss 06/03/2014   On home oxygen therapy    "2L oxygen concentrator @ night"   Osteoporosis     Oxygen desaturation during sleep    wears 2 liters of oxygen at night    Palpitations 35years ago   Cardionet monitor - revealed mostly normal sinus rhythm, sinus bradycardia with first-degree A-V block heart rates mostly in the 50s and 60s with some 70s. No arrhythmias, PVCs or PACs noted.   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 s/p [employer] PPM in 03/2015 by Dr. Lovena Le secondary to sinus node dysfunction.    TIA (transient ischemic attack) March 2014   Past Surgical History:  Procedure Laterality Date   ABDOMINAL HYSTERECTOMY     APPENDECTOMY     BACK SURGERY     steroid inj   CATARACT EXTRACTION Bilateral    CATARACT EXTRACTION W/ INTRAOCULAR LENS  IMPLANT, BILATERAL Bilateral 2000s   CHOLECYSTECTOMY     EP IMPLANTABLE DEVICE N/A 03/24/2015   [facility] Scientific PPM, Dr. Lovena Le   ESOPHAGOGASTRODUODENOSCOPY  08/09/2011   Procedure: ESOPHAGOGASTRODUODENOSCOPY (EGD);  Surgeon: Jeryl Columbia, MD;  Location: [facility];  Service: Endoscopy;  Laterality: N/A;   ESOPHAGOGASTRODUODENOSCOPY (EGD) WITH PROPOFOL N/A 11/12/2013   Procedure: ESOPHAGOGASTRODUODENOSCOPY (EGD) WITH PROPOFOL;  Surgeon: Jeryl Columbia, MD;  Location: [facility]OSCOPY;  Service: Endoscopy;  Laterality: N/A;   ESOPHAGOGASTRODUODENOSCOPY (EGD) WITH PROPOFOL N/A 05/05/2016   Procedure: ESOPHAGOGASTRODUODENOSCOPY (EGD) WITH PROPOFOL;  Surgeon: Clarene Essex, MD;  Location: [facility] ENDOSCOPY;  Service: Endoscopy;  Laterality: N/A;   EYE SURGERY Bilateral    Cat Sx OU   GLAUCOMA SURGERY  Bilateral 2000s   "laser"   HOT HEMOSTASIS  08/09/2011   Procedure: HOT HEMOSTASIS (ARGON PLASMA COAGULATION/BICAP);  Surgeon: Jeryl Columbia, MD;  Location: [facility];  Service: Endoscopy;  Laterality: N/A;   HOT HEMOSTASIS N/A 11/12/2013   Procedure: HOT HEMOSTASIS (ARGON PLASMA COAGULATION/BICAP);  Surgeon: Jeryl Columbia, MD;  Location: [facility];  Service: Endoscopy;  Laterality: N/A;   INSERT / REPLACE / REMOVE PACEMAKER  03/24/2015   IRIDOTOMY / IRIDECTOMY Bilateral    JOINT REPLACEMENT     KNEE SURGERY Left done with 2nd left knee replacement   spacer bar placement   LEFT HEART CATHETERIZATION WITH CORONARY ANGIOGRAM N/A 02/14/2012   Procedure: LEFT HEART CATHETERIZATION WITH CORONARY ANGIOGRAM;  Surgeon: Leonie Man, MD;  Location: [facility] C[facility] LAB;  Service: Cardiovascular:: no evidence of obstructive coronary disease ,low EDP with normal EF   LEV DOPPLER  05/29/2012   RIGHT EXTREM. NORMAL VENOUS DUPLEX   NM MYOCAR PERF WALL MOTION  08/2019   EF 55 to 65%.  No ischemia or infarction.  LOW RISK:   PACEMAKER PLACEMENT     REVISION TOTAL KNEE ARTHROPLASTY Left    TONSILLECTOMY     TOTAL KNEE ARTHROPLASTY Bilateral    TRANSTHORACIC ECHOCARDIOGRAM  11/2017   11/2017: EF 60-65%. Gr1 DD. PAP ~33 mmHg.  Mild TR. PPM leads in RA & RV.    TRIGGER FINGER RELEASE  11/22/2011   Procedure: RELEASE TRIGGER FINGER/A-1 PULLEY;  Surgeon: Meredith Pel, MD;  Location: [facility];  Service: Orthopedics;  Laterality: Left;  Left trigger thumb release   TRIGGER FINGER RELEASE Right yrs ago    FAMILY HISTORY Family History  Problem Relation Age of Onset   Cancer Mother    Diabetes Mother    Cancer - Prostate Father    Diabetes Father    Retinal detachment Son    Diabetes  Sister    Diabetes Brother    Diabetes Paternal Grandmother     SOCIAL HISTORY Social History   Tobacco Use   Smoking status: Never    Passive exposure: Yes   Smokeless tobacco: Never  Vaping Use   Vaping Use: Never  used  Substance Use Topics   Alcohol use: No    Alcohol/week: 0.0 standard drinks   Drug use: No         OPHTHALMIC EXAM:  Not recorded     IMAGING AND PROCEDURES  Imaging and Procedures for '@TODAY'$ @          ASSESSMENT/PLAN:    ICD-10-CM   1. 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 Intermediate stage nonexudative age-related macular degeneration of both eyes  H35.3132     3. 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 Essential hypertension  I10     5. Hypertensive retinopathy of both eyes  H35.033     6. Pseudophakia of both eyes  Z96.1     7. Fuchs' corneal dystrophy of both eyes  H18.513     8. Diplopia  H53.2         1,2. 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 Mild nonproliferative diabetic retinopathy w/o DME, both eyes  - exam with scattered MA, IRH OU  - FA shows mild MA; no NV  - OCT without macular edema, both eyes   4,5. Hypertensive retinopathy OU  - discussed importance of tight BP control  - monitor   6. Pseudophakia OU  - s/p CE/IOL  - beautiful surgeries, doing well  - monitor   7. Fuch's endothelial dystrophy  - mild guttata OU, no edema   8. Horizontal binocular diplopia  - mild XT noted on exam  - managed by Dr. Herbert Deaner -- prism prescribed  - pt reports improvement in diplopia  w/ glasses but states that she doesn't wear them that much    Ophthalmic Meds Ordered this visit:  No orders of the defined types were placed in this encounter.       No follow-ups on file.  There are no Patient Instructions on file for this visit.   This document serves as a record of services personally performed by Gardiner Sleeper, MD, PhD. It was created on their behalf by Leonie Douglas, an ophthalmic technician. The creation of this record is the provider's dictation and/or activities during the visit.    Electronically signed by: Leonie Douglas COA, 07/14/21  10:18 AM   Gardiner Sleeper, M.D., Ph.D. Diseases & Surgery of the Retina and Vitreous [facility]: M myopia (nearsighted); A astigmatism; H hyperopia (farsighted); P presbyopia; Mrx spectacle prescription;  CTL contact lenses; OD right eye;  OS left eye; OU both eyes  XT exotropia; ET esotropia; PEK punctate epithelial keratitis; PEE punctate epithelial erosions; DES dry eye syndrome; MGD meibomian gland dysfunction; ATs artificial tears; PFAT's preservative free artificial tears; [place] nuclear sclerotic cataract; PSC posterior subcapsular cataract; ERM epi-retinal membrane; PVD posterior vitreous detachment; RD retinal detachment; DM diabetes mellitus; DR diabetic retinopathy; NPDR non-proliferative diabetic retinopathy; PDR proliferative diabetic retinopathy; CSME clinically significant macular edema; DME diabetic macular edema; dbh dot blot hemorrhages; CWS cotton wool spot; POAG primary open angle glaucoma; C/D cup-to-disc ratio; HVF humphrey visual field; GVF goldmann visual field; OCT optical coherence tomography; IOP intraocular pressure; BRVO Branch retinal vein occlusion; CRVO central retinal vein occlusion; CRAO central retinal artery occlusion; BRAO branch retinal artery occlusion; RT retinal tear; SB scleral buckle; PPV pars plana vitrectomy; VH Vitreous hemorrhage; PRP panretinal  laser photocoagulation; IVK intravitreal kenalog; VMT vitreomacular traction; MH Macular hole;  NVD neovascularization of the disc; NVE neovascularization elsewhere; AREDS age related eye disease study; ARMD age related macular degeneration; POAG primary open angle glaucoma; EBMD epithelial/anterior basement membrane dystrophy; ACIOL anterior chamber intraocular lens; IOL intraocular lens; PCIOL posterior chamber intraocular lens; Phaco/IOL phacoemulsification with intraocular lens placement; [place] photorefractive keratectomy; LASIK laser assisted in situ keratomileusis; HTN hypertension; DM diabetes mellitus; COPD chronic obstructive pulmonary disease

## 2021-07-16 ENCOUNTER — Encounter (INDEPENDENT_AMBULATORY_CARE_PROVIDER_SITE_OTHER): Payer: Medicare HMO | Admitting: Ophthalmology

## 2021-07-16 DIAGNOSIS — E113293 Type 2 diabetes mellitus with mild nonproliferative diabetic retinopathy without macular edema, bilateral: Secondary | ICD-10-CM

## 2021-07-16 DIAGNOSIS — H532 Diplopia: Secondary | ICD-10-CM

## 2021-07-16 DIAGNOSIS — H353132 Nonexudative age-related macular degeneration, bilateral, intermediate dry stage: Secondary | ICD-10-CM

## 2021-07-16 DIAGNOSIS — H35723 Serous detachment of retinal pigment epithelium, bilateral: Secondary | ICD-10-CM

## 2021-07-16 DIAGNOSIS — H18513 Endothelial corneal dystrophy, bilateral: Secondary | ICD-10-CM

## 2021-07-16 DIAGNOSIS — I10 Essential (primary) hypertension: Secondary | ICD-10-CM

## 2021-07-16 DIAGNOSIS — Z961 Presence of intraocular lens: Secondary | ICD-10-CM

## 2021-07-16 DIAGNOSIS — H35033 Hypertensive retinopathy, bilateral: Secondary | ICD-10-CM

## 2021-07-20 NOTE — Progress Notes (Signed)
[facility] Note  07/23/2021     CHIEF COMPLAINT Patient presents for Retina Follow Up    HISTORY OF PRESENT ILLNESS: Tara Jackson is a 81 y.o. female who presents to the clinic today for:   HPI     Retina Follow Up   Patient presents with  Dry AMD (NPDR OU ).  In both eyes.  Severity is moderate.  Duration of 10 months.  Since onset it is gradually improving.  I, the attending physician,  performed the HPI with the patient and updated documentation appropriately.        Comments   Patient states vision improved some since last visit. BS was 126 last pm. Last a1c was less than 8.      Last edited by Bernarda Caffey, MD on 07/24/2021  1:06 AM.    Had to reschedule her appt due to having falls and not feeling well.     Referring physician: Shon Baton, MD [address] [place],  [place] [address]  HISTORICAL INFORMATION:   Selected notes from the MEDICAL RECORD NUMBER Referred by Dr. Monna Fam for concern of unusual large drusenoid, epi detachement LEE: 11.11.19 (K. Hecker) [BCVA: OD: 20/25++ OS: 20/20--] Ocular Hx-cataracts OU, glaucoma OU PMH-anemia, anxiety, arthritis, asthma, CKD, CAD, depression, DM(takes Novolin and glipizide), HLD, HTN,stroke    CURRENT MEDICATIONS: No current outpatient medications on file. (Ophthalmic Drugs)   No current facility-administered medications for this visit. (Ophthalmic Drugs)   Current Outpatient Medications (Other)  Medication Sig   acetaminophen (TYLENOL) 325 MG tablet Take 2 tablets (650 mg total) by mouth every 6 (six) hours as needed for up to 30 doses for mild pain or moderate pain.   albuterol (VENTOLIN HFA) 108 (90 Base) MCG/ACT inhaler INHALE 2 INHALATIONS 3 TIMES A DAY AS NEEDED FOR COUGH/WHEEZE   busPIRone (BUSPAR) 10 MG tablet Take 1 tablet (10 mg total) by mouth every morning.   Cholecalciferol (VITAMIN D-3) 25 MCG (1000 UT) CAPS Take 2,000 Units by mouth every morning.    clonazePAM (KLONOPIN) 0.5 MG tablet Take 0.5-1 mg by mouth See admin instructions. Takes '1mg'$  in am and 0.'5mg'$  in pm.   clopidogrel (PLAVIX) 75 MG tablet Take 75 mg by mouth every morning.   dicyclomine (BENTYL) 20 MG tablet Take 40 mg by mouth in the morning and at bedtime.   donepezil (ARICEPT) 10 MG tablet TAKE 1 TABLET BY MOUTH EVERYDAY AT BEDTIME   esomeprazole (NEXIUM) 40 MG capsule Take 40 mg by mouth daily.   furosemide (LASIX) 20 MG tablet TAKE 1 TABLET (20 MG TOTAL) BY MOUTH DAILY AS NEEDED FOR FLUID OR EDEMA. AS DIRECTED   glipiZIDE (GLUCOTROL) 10 MG tablet Take 20 mg by mouth 2 (two) times daily before a meal.    guaiFENesin (MUCINEX) 600 MG 12 hr tablet Take 600 mg by mouth 2 (two) times daily as needed for cough.   insulin NPH (HUMULIN N,NOVOLIN N) 100 UNIT/ML injection Inject 5-20 Units into the skin See admin instructions. In the morning:  If cbg >300, then give 20 units. If cbg < or = 300, then give 15 unit At night: If cbg >250, then give 5 units. If cbg < or = 250, then give no insulin   insulin regular (NOVOLIN R,HUMULIN R) 100 units/mL injection Inject 0-30 Units into the skin See admin instructions. At breakfast: if cbg < 300, then give 25 units. If cbg > or = 300, then give 30 units. After  dinner: goal cbgs is 150 and give insulin dose to reach this. For every 100 cbg over 150, then give 10 units of insulin.   levothyroxine (SYNTHROID) 100 MCG tablet Take 100 mcg by mouth daily before breakfast.   lidocaine (LIDODERM) 5 % Place 2 patches onto the skin daily as needed (pain). Remove & Discard patch within 12 hours or as directed by MD   linaclotide (LINZESS) 290 MCG CAPS capsule Take 290 mcg by mouth daily before breakfast.   Menthol-Methyl Salicylate (MUSCLE RUB) 10-15 % CREA Apply 1 application topically as needed for muscle pain.   nortriptyline (PAMELOR) 50 MG capsule Take 1 capsule (50 mg total) by mouth 2 (two) times daily.   OXYGEN Inhale 2 L into the lungs as needed. Used  at overnight with a concentrator   pantoprazole (PROTONIX) 40 MG tablet TAKE 1 TABLET BY MOUTH EVERY DAY   potassium chloride SA (KLOR-CON M) 20 MEQ tablet Take 20 mEq by mouth 2 (two) times daily. Take 1/2 tablet twice daily.   rosuvastatin (CRESTOR) 20 MG tablet Take 20 mg by mouth daily.    solifenacin (VESICARE) 5 MG tablet Take 5 mg by mouth daily as needed (to prevent UTI).   SYMBICORT 80-4.5 MCG/ACT inhaler INHALE 2 PUFFS FIRST THING IN THE MORNING THEN ANOTHER 2 PUFFS ABOUT 12 HOURS LATER   terconazole (TERAZOL 7) 0.4 % vaginal cream Place 1 applicator vaginally daily as needed for itching.   traMADol (ULTRAM) 50 MG tablet Take 1 tablet (50 mg total) by mouth every 12 (twelve) hours as needed for up to 20 doses.   No current facility-administered medications for this visit. (Other)   REVIEW OF SYSTEMS: ROS   Positive for: Neurological, Genitourinary, Endocrine, Cardiovascular, Eyes, Respiratory Negative for: Constitutional, Gastrointestinal, Skin, Musculoskeletal, HENT, Psychiatric, Allergic/Imm, Heme/Lymph Last edited by Jobe Marker, COT on 07/23/2021  1:25 PM.     ALLERGIES Allergies  Allergen Reactions   Iodine Other (See Comments)    ONLY IV--Makes unconscious   Iohexol Other (See Comments)     Code: SOB, Desc: cardiac arrest w/ iv contrast, has never used 13 hr prep//alice calhoun, Onset Date: [id number]    Metoclopramide Hcl Other (See Comments)    suicidal   Sulfa Antibiotics Hives   Lactose Intolerance (Gi) Diarrhea   Lansoprazole Nausea And Vomiting    PAST MEDICAL HISTORY Past Medical History:  Diagnosis Date   Anemia    Anxiety    Arthritis    Asthma    Brittle bone disease    CKD (chronic kidney disease)    Complication of anesthesia    hard to wake up after anesthesia, trouble turning head   Coronary artery disease, non-occlusive 2014   a. minimal CAD by cath in 2014 with 40% RI stenosis. b. low-risk NST in 11/2014.   Depression    Diabetes  mellitus type 2, insulin dependent (HCC)    Diabetic neuropathy (HCC)    Diabetic retinopathy ([place])    NPDR OU   Diastolic dysfunction, left ventricle 11/30/14   EF 55%, grade 1 DD   DVT (deep venous thrombosis) ([place])    "18 in RLE; 7 LLE prior to PE" (03/24/2015)   Family history of adverse reaction to anesthesia    " the whole family is hard to wake up"   Glaucoma    H/O hiatal hernia    Hyperlipidemia    Hypertension associated with diabetes ([place])    Hypertensive retinopathy    OU  Hypothyroidism    Kidney stones    "passed them"   Macular degeneration    Exu OU   Memory loss 06/03/2014   On home oxygen therapy    "2L oxygen concentrator @ night"   Osteoporosis    Oxygen desaturation during sleep    wears 2 liters of oxygen at night    Palpitations 35years ago   Cardionet monitor - revealed mostly normal sinus rhythm, sinus bradycardia with first-degree A-V block heart rates mostly in the 50s and 60s with some 70s. No arrhythmias, PVCs or PACs noted.   PE (pulmonary thromboembolism) ([place]) x 3, last one was ~ 2003   history of recurrent RLE DVT with PE - last PE ~>13 yrs; Maintained on Plavix   Pneumonia    Presence of permanent cardiac pacemaker    Restless legs syndrome    Sleep apnea    cpap disontinued; test done 07/14/2008 ordered per  Byrum   Spinal headache    Spinal stenosis    L 3, L 4 and L 5, and C 1, C 2 and C3   Stroke ([place]) 04-19-2013   tia and stroke. 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 s/p [employer] PPM in 03/2015 by Dr. Lovena Le secondary to sinus node dysfunction.    TIA (transient ischemic attack) March 2014   Past Surgical History:  Procedure Laterality Date   ABDOMINAL HYSTERECTOMY     APPENDECTOMY     BACK SURGERY     steroid inj   CATARACT EXTRACTION Bilateral    CATARACT EXTRACTION W/ INTRAOCULAR LENS  IMPLANT, BILATERAL Bilateral 2000s   CHOLECYSTECTOMY     EP IMPLANTABLE DEVICE N/A 03/24/2015   [facility]  Scientific PPM, Dr. Lovena Le   ESOPHAGOGASTRODUODENOSCOPY  08/09/2011   Procedure: ESOPHAGOGASTRODUODENOSCOPY (EGD);  Surgeon: Jeryl Columbia, MD;  Location: [facility];  Service: Endoscopy;  Laterality: N/A;   ESOPHAGOGASTRODUODENOSCOPY (EGD) WITH PROPOFOL N/A 11/12/2013   Procedure: ESOPHAGOGASTRODUODENOSCOPY (EGD) WITH PROPOFOL;  Surgeon: Jeryl Columbia, MD;  Location: [facility] ENDOSCOPY;  Service: Endoscopy;  Laterality: N/A;   ESOPHAGOGASTRODUODENOSCOPY (EGD) WITH PROPOFOL N/A 05/05/2016   Procedure: ESOPHAGOGASTRODUODENOSCOPY (EGD) WITH PROPOFOL;  Surgeon: Clarene Essex, MD;  Location: [facility] ENDOSCOPY;  Service: Endoscopy;  Laterality: N/A;   EYE SURGERY Bilateral    Cat Sx OU   GLAUCOMA SURGERY Bilateral 2000s   "laser"   HOT HEMOSTASIS  08/09/2011   Procedure: HOT HEMOSTASIS (ARGON PLASMA COAGULATION/BICAP);  Surgeon: Jeryl Columbia, MD;  Location: [facility];  Service: Endoscopy;  Laterality: N/A;   HOT HEMOSTASIS N/A 11/12/2013   Procedure: HOT HEMOSTASIS (ARGON PLASMA COAGULATION/BICAP);  Surgeon: Jeryl Columbia, MD;  Location: [facility];  Service: Endoscopy;  Laterality: N/A;   INSERT / REPLACE / REMOVE PACEMAKER  03/24/2015   IRIDOTOMY / IRIDECTOMY Bilateral    JOINT REPLACEMENT     KNEE SURGERY Left done with 2nd left knee replacement   spacer bar placement   LEFT HEART CATHETERIZATION WITH CORONARY ANGIOGRAM N/A 02/14/2012   Procedure: LEFT HEART CATHETERIZATION WITH CORONARY ANGIOGRAM;  Surgeon: Leonie Man, MD;  Location: [facility] CATH LAB;  Service: Cardiovascular:: no evidence of obstructive coronary disease ,low EDP with normal EF   LEV DOPPLER  05/29/2012   RIGHT EXTREM. NORMAL VENOUS DUPLEX   NM MYOCAR PERF WALL MOTION  08/2019   EF 55 to 65%.  No ischemia or infarction.  LOW RISK:   PACEMAKER PLACEMENT     REVISION TOTAL KNEE ARTHROPLASTY Left  TONSILLECTOMY     TOTAL KNEE ARTHROPLASTY Bilateral    TRANSTHORACIC ECHOCARDIOGRAM  11/2017   11/2017: EF 60-65%. Gr1 DD. PAP ~33 mmHg.  Mild  TR. PPM leads in RA & RV.    TRIGGER FINGER RELEASE  11/22/2011   Procedure: RELEASE TRIGGER FINGER/A-1 PULLEY;  Surgeon: Meredith Pel, MD;  Location: [facility];  Service: Orthopedics;  Laterality: Left;  Left trigger thumb release   TRIGGER FINGER RELEASE Right yrs ago   FAMILY HISTORY Family History  Problem Relation Age of Onset   Cancer Mother    Diabetes Mother    Cancer - Prostate Father    Diabetes Father    Retinal detachment Son    Diabetes Sister    Diabetes Brother    Diabetes Paternal Grandmother    SOCIAL HISTORY Social History   Tobacco Use   Smoking status: Never    Passive exposure: Yes   Smokeless tobacco: Never  Vaping Use   Vaping Use: Never used  Substance Use Topics   Alcohol use: No    Alcohol/week: 0.0 standard drinks of alcohol   Drug use: No       OPHTHALMIC EXAM:  Base Eye Exam     Visual Acuity (Snellen - Linear)       Right Left   Dist [place] 20/30 20/40   Dist ph [place] NI 20/30 -1         Tonometry   Unable-pt squeezing        Pupils       Dark Light Shape React APD   Right 3 2 Round Brisk None   Left 3 2 Round Brisk None         Visual Fields (Counting fingers)       Left Right    Full Full         Extraocular Movement       Right Left    Full, Ortho Full, Ortho         Neuro/Psych     Oriented x3: Yes   Mood/Affect: Normal         Dilation     Both eyes: 1.0% Mydriacyl, 2.5% Phenylephrine @ 1:36 PM           Slit Lamp and Fundus Exam     Slit Lamp Exam       Right Left   Lids/Lashes Dermatochalasis - upper lid, Meibomian gland dysfunction Dermatochalasis - lower lid, Meibomian gland dysfunction   Conjunctiva/Sclera mild melanosis White and quiet   Cornea arcus, trace PEE, Mild debris in tear film arcus, well healed temporal cataract wound, 1+PEE   Anterior Chamber Deep and quiet Deep and quiet   Iris round, no NVI, poorly dilated 35m Round, mild atrophy inferotemporal, poorly dilated to  378m  Lens PCIOL PC IOL   Anterior Vitreous Vitreous syneresis Vitreous syneresis         Fundus Exam       Right Left   Disc mild pallor, central cupping, Thin inferior rim Mild pallor, sharp rim, +cupping   C/D Ratio 0.7 0.6   Macula good foveal reflex, RPE motltling, clumping and atrophy, PED, no heme large PED nasal macula, blunted foveal reflex, scattered MA, RPE mottling, No heme or edema   Vessels Vascular attenuation, mild tortuousity Vascular attenuation, mildly tortuous.   Periphery Attached, difficult view (poor dilation), No heme Attached, difficult view (poor dilation), No heme           IMAGING AND  PROCEDURES  Imaging and Procedures for '@TODAY'$ @  OCT, Retina - OU - Both Eyes       Right Eye Quality was good. Central Foveal Thickness: 207. Progression has been stable. Findings include normal foveal contour, no IRF, no SRF, retinal drusen , epiretinal membrane, pigment epithelial detachment, outer retinal atrophy, vitreomacular adhesion (stable).   Left Eye Quality was good. Central Foveal Thickness: 217. Progression has been stable. Findings include normal foveal contour, no IRF, no SRF, retinal drusen , pigment epithelial detachment, vitreomacular adhesion (Mild improvement in nasal PED, No DME).   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 CME - cystoid macular edema. PED - pigment epithelial detachment. IRF - intraretinal fluid. SRF - subretinal fluid. EZ - ellipsoid zone. ERM - epiretinal membrane. ORA - outer retinal atrophy. ORT - outer retinal tubulation. SRHM - subretinal hyper-reflective material            ASSESSMENT/PLAN:    ICD-10-CM   1. 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  Intermediate stage nonexudative age-related macular degeneration of both eyes  H35.3132 OCT, Retina - OU - Both Eyes    3. Mild nonproliferative diabetic retinopathy of both eyes without macular edema associated with type 2 diabetes mellitus ([place])  Y09.9833     4. Essential hypertension  I10     5. Hypertensive retinopathy of both eyes  H35.033     6. Pseudophakia of both eyes  Z96.1     7. Fuchs' corneal dystrophy of both eyes  H18.513     8. Diplopia  H53.2      1,2. 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 Mild nonproliferative diabetic retinopathy w/o DME, both eyes  - exam with scattered MA, IRH OU  - FA shows mild MA; no NV  - OCT without macular edema, both eyes  4,5. Hypertensive retinopathy OU  - discussed importance of tight BP control  - monitor   6. Pseudophakia OU  - s/p CE/IOL  - beautiful surgeries, doing well  - monitor   7. Fuch's endothelial dystrophy  - mild guttata OU, no edema  8. Horizontal binocular diplopia  - mild XT noted on exam  - managed by Dr. Herbert Deaner -- prism prescribed  - pt reports improvement in diplopia w/ glasses but states that she doesn't wear them that much   Ophthalmic Meds Ordered this visit:  No orders of the defined types were placed in this encounter.    Return for Return 9-12 months DFE/OCT.  There are no Patient Instructions on file for this visit.  This document serves as a record of  services  personally performed by Gardiner Sleeper, MD, PhD. It was created on their behalf by Leonie Douglas, an ophthalmic technician. The creation of this record is the provider's dictation and/or activities during the visit.    Electronically signed by: Leonie Douglas COA, 07/24/21  1:07 AM   Gardiner Sleeper, M.D., Ph.D. Diseases & Surgery of the Retina and Vitreous [facility]  I have reviewed the above documentation for accuracy and completeness, and I agree with the above. Gardiner Sleeper, M.D., Ph.D. 07/24/21 1:09 AM   Abbreviations: M myopia (nearsighted); A astigmatism; H hyperopia (farsighted); P presbyopia; Mrx spectacle prescription;  CTL contact lenses; OD right eye; OS left eye; OU both eyes  XT exotropia; ET esotropia; PEK punctate epithelial keratitis; PEE punctate epithelial erosions; DES dry eye syndrome; MGD meibomian gland dysfunction; ATs artificial tears; PFAT's preservative free artificial tears; [place] nuclear sclerotic cataract; PSC posterior subcapsular cataract; ERM epi-retinal membrane; PVD posterior vitreous detachment; RD retinal detachment; DM diabetes mellitus; DR diabetic retinopathy; NPDR non-proliferative diabetic retinopathy; PDR proliferative diabetic retinopathy; CSME clinically significant macular edema; DME diabetic macular edema; dbh dot blot hemorrhages; CWS cotton wool spot; POAG primary open angle glaucoma; C/D cup-to-disc ratio; HVF humphrey visual field; GVF goldmann visual field; OCT optical coherence tomography; IOP intraocular pressure; BRVO Branch retinal vein occlusion; CRVO central retinal vein occlusion; CRAO central retinal artery occlusion; BRAO branch retinal artery occlusion; RT retinal tear; SB scleral buckle; PPV pars plana vitrectomy; VH Vitreous hemorrhage; PRP panretinal laser photocoagulation; IVK intravitreal kenalog; VMT vitreomacular traction; MH Macular hole;  NVD neovascularization of the disc; NVE neovascularization  elsewhere; AREDS age related eye disease study; ARMD age related macular degeneration; POAG primary open angle glaucoma; EBMD epithelial/anterior basement membrane dystrophy; ACIOL anterior chamber intraocular lens; IOL intraocular lens; PCIOL posterior chamber intraocular lens; Phaco/IOL phacoemulsification with intraocular lens placement; [place] photorefractive keratectomy; LASIK laser assisted in situ keratomileusis; HTN hypertension; DM diabetes mellitus; COPD chronic obstructive pulmonary disease

## 2021-07-23 ENCOUNTER — Ambulatory Visit (INDEPENDENT_AMBULATORY_CARE_PROVIDER_SITE_OTHER): Payer: Medicare HMO | Admitting: Ophthalmology

## 2021-07-23 ENCOUNTER — Encounter (INDEPENDENT_AMBULATORY_CARE_PROVIDER_SITE_OTHER): Payer: Self-pay | Admitting: Ophthalmology

## 2021-07-23 DIAGNOSIS — I10 Essential (primary) hypertension: Secondary | ICD-10-CM

## 2021-07-23 DIAGNOSIS — H353132 Nonexudative age-related macular degeneration, bilateral, intermediate dry stage: Secondary | ICD-10-CM

## 2021-07-23 DIAGNOSIS — H18513 Endothelial corneal dystrophy, bilateral: Secondary | ICD-10-CM

## 2021-07-23 DIAGNOSIS — H35723 Serous detachment of retinal pigment epithelium, bilateral: Secondary | ICD-10-CM

## 2021-07-23 DIAGNOSIS — H35033 Hypertensive retinopathy, bilateral: Secondary | ICD-10-CM

## 2021-07-23 DIAGNOSIS — Z961 Presence of intraocular lens: Secondary | ICD-10-CM

## 2021-07-23 DIAGNOSIS — H532 Diplopia: Secondary | ICD-10-CM

## 2021-07-23 DIAGNOSIS — E113293 Type 2 diabetes mellitus with mild nonproliferative diabetic retinopathy without macular edema, bilateral: Secondary | ICD-10-CM

## 2021-07-24 ENCOUNTER — Encounter (INDEPENDENT_AMBULATORY_CARE_PROVIDER_SITE_OTHER): Payer: Self-pay | Admitting: Ophthalmology

## 2021-08-13 ENCOUNTER — Inpatient Hospital Stay (HOSPITAL_COMMUNITY)
Admission: EM | Admit: 2021-08-13 | Discharge: 2021-08-16 | DRG: 682 | Disposition: A | Discharge status: Home Health | Payer: Medicare HMO | Attending: Family Medicine | Admitting: Family Medicine

## 2021-08-13 ENCOUNTER — Other Ambulatory Visit: Payer: Self-pay

## 2021-08-13 DIAGNOSIS — Z888 Allergy status to other drugs, medicaments and biological substances status: Secondary | ICD-10-CM

## 2021-08-13 DIAGNOSIS — E1169 Type 2 diabetes mellitus with other specified complication: Secondary | ICD-10-CM | POA: Diagnosis present

## 2021-08-13 DIAGNOSIS — E860 Dehydration: Secondary | ICD-10-CM | POA: Diagnosis present

## 2021-08-13 DIAGNOSIS — Z8673 Personal history of transient ischemic attack (TIA), and cerebral infarction without residual deficits: Secondary | ICD-10-CM

## 2021-08-13 DIAGNOSIS — E538 Deficiency of other specified B group vitamins: Secondary | ICD-10-CM | POA: Diagnosis present

## 2021-08-13 DIAGNOSIS — Z86711 Personal history of pulmonary embolism: Secondary | ICD-10-CM

## 2021-08-13 DIAGNOSIS — F32A Depression, unspecified: Secondary | ICD-10-CM | POA: Diagnosis present

## 2021-08-13 DIAGNOSIS — E1122 Type 2 diabetes mellitus with diabetic chronic kidney disease: Secondary | ICD-10-CM | POA: Diagnosis present

## 2021-08-13 DIAGNOSIS — E669 Obesity, unspecified: Secondary | ICD-10-CM | POA: Diagnosis present

## 2021-08-13 DIAGNOSIS — Z7902 Long term (current) use of antithrombotics/antiplatelets: Secondary | ICD-10-CM

## 2021-08-13 DIAGNOSIS — M810 Age-related osteoporosis without current pathological fracture: Secondary | ICD-10-CM | POA: Diagnosis present

## 2021-08-13 DIAGNOSIS — G2581 Restless legs syndrome: Secondary | ICD-10-CM | POA: Diagnosis present

## 2021-08-13 DIAGNOSIS — Z79899 Other long term (current) drug therapy: Secondary | ICD-10-CM

## 2021-08-13 DIAGNOSIS — N179 Acute kidney failure, unspecified: Secondary | ICD-10-CM | POA: Diagnosis not present

## 2021-08-13 DIAGNOSIS — I495 Sick sinus syndrome: Secondary | ICD-10-CM | POA: Diagnosis present

## 2021-08-13 DIAGNOSIS — I2510 Atherosclerotic heart disease of native coronary artery without angina pectoris: Secondary | ICD-10-CM | POA: Diagnosis present

## 2021-08-13 DIAGNOSIS — K529 Noninfective gastroenteritis and colitis, unspecified: Secondary | ICD-10-CM | POA: Diagnosis present

## 2021-08-13 DIAGNOSIS — E1129 Type 2 diabetes mellitus with other diabetic kidney complication: Secondary | ICD-10-CM | POA: Diagnosis present

## 2021-08-13 DIAGNOSIS — T50905A Adverse effect of unspecified drugs, medicaments and biological substances, initial encounter: Secondary | ICD-10-CM | POA: Diagnosis present

## 2021-08-13 DIAGNOSIS — W19XXXA Unspecified fall, initial encounter: Secondary | ICD-10-CM | POA: Diagnosis present

## 2021-08-13 DIAGNOSIS — E1140 Type 2 diabetes mellitus with diabetic neuropathy, unspecified: Secondary | ICD-10-CM | POA: Diagnosis present

## 2021-08-13 DIAGNOSIS — E1165 Type 2 diabetes mellitus with hyperglycemia: Secondary | ICD-10-CM | POA: Diagnosis present

## 2021-08-13 DIAGNOSIS — N1832 Chronic kidney disease, stage 3b: Secondary | ICD-10-CM | POA: Diagnosis present

## 2021-08-13 DIAGNOSIS — Z950 Presence of cardiac pacemaker: Secondary | ICD-10-CM

## 2021-08-13 DIAGNOSIS — G928 Other toxic encephalopathy: Secondary | ICD-10-CM | POA: Diagnosis present

## 2021-08-13 DIAGNOSIS — Z86718 Personal history of other venous thrombosis and embolism: Secondary | ICD-10-CM

## 2021-08-13 DIAGNOSIS — Y92009 Unspecified place in unspecified non-institutional (private) residence as the place of occurrence of the external cause: Secondary | ICD-10-CM

## 2021-08-13 DIAGNOSIS — E785 Hyperlipidemia, unspecified: Secondary | ICD-10-CM | POA: Diagnosis present

## 2021-08-13 DIAGNOSIS — Z6836 Body mass index (BMI) 36.0-36.9, adult: Secondary | ICD-10-CM

## 2021-08-13 DIAGNOSIS — E039 Hypothyroidism, unspecified: Secondary | ICD-10-CM | POA: Diagnosis present

## 2021-08-13 DIAGNOSIS — Z7984 Long term (current) use of oral hypoglycemic drugs: Secondary | ICD-10-CM

## 2021-08-13 DIAGNOSIS — E113293 Type 2 diabetes mellitus with mild nonproliferative diabetic retinopathy without macular edema, bilateral: Secondary | ICD-10-CM | POA: Diagnosis present

## 2021-08-13 DIAGNOSIS — R112 Nausea with vomiting, unspecified: Secondary | ICD-10-CM | POA: Diagnosis present

## 2021-08-13 DIAGNOSIS — F419 Anxiety disorder, unspecified: Secondary | ICD-10-CM | POA: Diagnosis present

## 2021-08-13 DIAGNOSIS — I152 Hypertension secondary to endocrine disorders: Secondary | ICD-10-CM | POA: Diagnosis present

## 2021-08-13 DIAGNOSIS — Z794 Long term (current) use of insulin: Secondary | ICD-10-CM

## 2021-08-13 DIAGNOSIS — I5030 Unspecified diastolic (congestive) heart failure: Secondary | ICD-10-CM | POA: Diagnosis present

## 2021-08-13 DIAGNOSIS — Q780 Osteogenesis imperfecta: Secondary | ICD-10-CM

## 2021-08-13 DIAGNOSIS — J45909 Unspecified asthma, uncomplicated: Secondary | ICD-10-CM | POA: Diagnosis present

## 2021-08-13 DIAGNOSIS — I130 Hypertensive heart and chronic kidney disease with heart failure and stage 1 through stage 4 chronic kidney disease, or unspecified chronic kidney disease: Secondary | ICD-10-CM | POA: Diagnosis present

## 2021-08-13 DIAGNOSIS — Z882 Allergy status to sulfonamides status: Secondary | ICD-10-CM

## 2021-08-13 DIAGNOSIS — Z833 Family history of diabetes mellitus: Secondary | ICD-10-CM

## 2021-08-13 DIAGNOSIS — R531 Weakness: Secondary | ICD-10-CM

## 2021-08-13 LAB — CBC
HCT: 42.4 % (ref 36.0–46.0)
Hemoglobin: 13.6 g/dL (ref 12.0–15.0)
MCH: 31 pg (ref 26.0–34.0)
MCHC: 32.1 g/dL (ref 30.0–36.0)
MCV: 96.6 fL (ref 80.0–100.0)
Platelets: 251 10*3/uL (ref 150–400)
RBC: 4.39 MIL/uL (ref 3.87–5.11)
RDW: 12.2 % (ref 11.5–15.5)
WBC: 8.5 10*3/uL (ref 4.0–10.5)
nRBC: 0 % (ref 0.0–0.2)

## 2021-08-13 LAB — BASIC METABOLIC PANEL
Anion gap: 15 (ref 5–15)
BUN: 34 mg/dL — ABNORMAL HIGH (ref 8–23)
CO2: 24 mmol/L (ref 22–32)
Calcium: 9.4 mg/dL (ref 8.9–10.3)
Chloride: 99 mmol/L (ref 98–111)
Creatinine, Ser: 2.4 mg/dL — ABNORMAL HIGH (ref 0.44–1.00)
GFR, Estimated: 20 mL/min — ABNORMAL LOW (ref >60)
Glucose, Bld: 271 mg/dL — ABNORMAL HIGH (ref 70–99)
Potassium: 3.6 mmol/L (ref 3.5–5.1)
Sodium: 138 mmol/L (ref 135–145)

## 2021-08-13 LAB — CBG MONITORING, ED: Glucose-Capillary: 268 mg/dL — ABNORMAL HIGH (ref 70–99)

## 2021-08-13 NOTE — ED Triage Notes (Addendum)
Pt presents from home with husband with [facility] for weakness x2 days that today impacted her mobility such that her husband had to assist her to the floor just prior to calling EMS. Denies LOC, head injury, SOB. Positive N/V. Poor PO intake, generalized weakness, hyperglycemia ('341mg'$ /dL, did take her insulin today). Takes plavix. Pt husband says that she has had CP but has this frequently d/t position of pacemaker. H/o CKD (not on HD), dementia, DM.

## 2021-08-14 DIAGNOSIS — N189 Chronic kidney disease, unspecified: Secondary | ICD-10-CM | POA: Diagnosis not present

## 2021-08-14 DIAGNOSIS — I152 Hypertension secondary to endocrine disorders: Secondary | ICD-10-CM | POA: Diagnosis present

## 2021-08-14 DIAGNOSIS — I495 Sick sinus syndrome: Secondary | ICD-10-CM | POA: Diagnosis present

## 2021-08-14 DIAGNOSIS — E785 Hyperlipidemia, unspecified: Secondary | ICD-10-CM | POA: Diagnosis present

## 2021-08-14 DIAGNOSIS — N179 Acute kidney failure, unspecified: Secondary | ICD-10-CM

## 2021-08-14 DIAGNOSIS — Q780 Osteogenesis imperfecta: Secondary | ICD-10-CM | POA: Diagnosis not present

## 2021-08-14 DIAGNOSIS — E039 Hypothyroidism, unspecified: Secondary | ICD-10-CM

## 2021-08-14 DIAGNOSIS — E860 Dehydration: Secondary | ICD-10-CM | POA: Diagnosis present

## 2021-08-14 DIAGNOSIS — G928 Other toxic encephalopathy: Secondary | ICD-10-CM | POA: Diagnosis present

## 2021-08-14 DIAGNOSIS — I5030 Unspecified diastolic (congestive) heart failure: Secondary | ICD-10-CM | POA: Diagnosis present

## 2021-08-14 DIAGNOSIS — W19XXXA Unspecified fall, initial encounter: Secondary | ICD-10-CM

## 2021-08-14 DIAGNOSIS — E1122 Type 2 diabetes mellitus with diabetic chronic kidney disease: Secondary | ICD-10-CM | POA: Diagnosis present

## 2021-08-14 DIAGNOSIS — I130 Hypertensive heart and chronic kidney disease with heart failure and stage 1 through stage 4 chronic kidney disease, or unspecified chronic kidney disease: Secondary | ICD-10-CM | POA: Diagnosis present

## 2021-08-14 DIAGNOSIS — E1140 Type 2 diabetes mellitus with diabetic neuropathy, unspecified: Secondary | ICD-10-CM | POA: Diagnosis present

## 2021-08-14 DIAGNOSIS — Z86718 Personal history of other venous thrombosis and embolism: Secondary | ICD-10-CM | POA: Diagnosis not present

## 2021-08-14 DIAGNOSIS — Y92009 Unspecified place in unspecified non-institutional (private) residence as the place of occurrence of the external cause: Secondary | ICD-10-CM | POA: Diagnosis not present

## 2021-08-14 DIAGNOSIS — K529 Noninfective gastroenteritis and colitis, unspecified: Secondary | ICD-10-CM | POA: Diagnosis present

## 2021-08-14 DIAGNOSIS — G2581 Restless legs syndrome: Secondary | ICD-10-CM | POA: Diagnosis present

## 2021-08-14 DIAGNOSIS — E113293 Type 2 diabetes mellitus with mild nonproliferative diabetic retinopathy without macular edema, bilateral: Secondary | ICD-10-CM | POA: Diagnosis present

## 2021-08-14 DIAGNOSIS — E1169 Type 2 diabetes mellitus with other specified complication: Secondary | ICD-10-CM | POA: Diagnosis present

## 2021-08-14 DIAGNOSIS — E1129 Type 2 diabetes mellitus with other diabetic kidney complication: Secondary | ICD-10-CM | POA: Diagnosis not present

## 2021-08-14 DIAGNOSIS — Z8673 Personal history of transient ischemic attack (TIA), and cerebral infarction without residual deficits: Secondary | ICD-10-CM | POA: Diagnosis not present

## 2021-08-14 DIAGNOSIS — Z7902 Long term (current) use of antithrombotics/antiplatelets: Secondary | ICD-10-CM | POA: Diagnosis not present

## 2021-08-14 DIAGNOSIS — E669 Obesity, unspecified: Secondary | ICD-10-CM | POA: Diagnosis present

## 2021-08-14 DIAGNOSIS — I2510 Atherosclerotic heart disease of native coronary artery without angina pectoris: Secondary | ICD-10-CM | POA: Diagnosis present

## 2021-08-14 DIAGNOSIS — J45909 Unspecified asthma, uncomplicated: Secondary | ICD-10-CM | POA: Diagnosis present

## 2021-08-14 DIAGNOSIS — N1832 Chronic kidney disease, stage 3b: Secondary | ICD-10-CM | POA: Diagnosis present

## 2021-08-14 DIAGNOSIS — R531 Weakness: Secondary | ICD-10-CM

## 2021-08-14 DIAGNOSIS — F32A Depression, unspecified: Secondary | ICD-10-CM | POA: Diagnosis present

## 2021-08-14 LAB — BASIC METABOLIC PANEL
Anion gap: 9 (ref 5–15)
BUN: 31 mg/dL — ABNORMAL HIGH (ref 8–23)
CO2: 26 mmol/L (ref 22–32)
Calcium: 9 mg/dL (ref 8.9–10.3)
Chloride: 103 mmol/L (ref 98–111)
Creatinine, Ser: 2.33 mg/dL — ABNORMAL HIGH (ref 0.44–1.00)
GFR, Estimated: 20 mL/min — ABNORMAL LOW (ref >60)
Glucose, Bld: 197 mg/dL — ABNORMAL HIGH (ref 70–99)
Potassium: 3.2 mmol/L — ABNORMAL LOW (ref 3.5–5.1)
Sodium: 138 mmol/L (ref 135–145)

## 2021-08-14 LAB — CBC
HCT: 36.8 % (ref 36.0–46.0)
Hemoglobin: 12.2 g/dL (ref 12.0–15.0)
MCH: 31.7 pg (ref 26.0–34.0)
MCHC: 33.2 g/dL (ref 30.0–36.0)
MCV: 95.6 fL (ref 80.0–100.0)
Platelets: 224 10*3/uL (ref 150–400)
RBC: 3.85 MIL/uL — ABNORMAL LOW (ref 3.87–5.11)
RDW: 12.3 % (ref 11.5–15.5)
WBC: 8.9 10*3/uL (ref 4.0–10.5)
nRBC: 0 % (ref 0.0–0.2)

## 2021-08-14 LAB — URINALYSIS, ROUTINE W REFLEX MICROSCOPIC
Bilirubin Urine: NEGATIVE
Glucose, UA: 50 mg/dL — AB
Ketones, ur: NEGATIVE mg/dL
Leukocytes,Ua: NEGATIVE
Nitrite: NEGATIVE
Protein, ur: 100 mg/dL — AB
Specific Gravity, Urine: 1.02 (ref 1.005–1.030)
pH: 5 (ref 5.0–8.0)

## 2021-08-14 LAB — CBG MONITORING, ED
Glucose-Capillary: 194 mg/dL — ABNORMAL HIGH (ref 70–99)
Glucose-Capillary: 331 mg/dL — ABNORMAL HIGH (ref 70–99)
Glucose-Capillary: 317 mg/dL — ABNORMAL HIGH (ref 70–99)
Glucose-Capillary: 286 mg/dL — ABNORMAL HIGH (ref 70–99)

## 2021-08-14 MED ORDER — LEVOTHYROXINE SODIUM 100 MCG PO TABS
100.0000 ug | ORAL_TABLET | Freq: Every day | ORAL | Status: DC
  Administered 2021-08-14 – 2021-08-16 (×3): 100 ug via ORAL
  Filled 2021-08-14 (×3): qty 1

## 2021-08-14 MED ORDER — ALBUTEROL SULFATE HFA 108 (90 BASE) MCG/ACT IN AERS: 1.0000 | INHALATION_SPRAY | Freq: Four times a day (QID) | RESPIRATORY_TRACT | Status: DC | PRN

## 2021-08-14 MED ORDER — LIDOCAINE 5 % EX PTCH
2.0000 | MEDICATED_PATCH | Freq: Every day | CUTANEOUS | Status: DC | PRN
Start: 2021-08-14 — End: 2021-08-16
  Administered 2021-08-15: 2 via TRANSDERMAL
  Filled 2021-08-14 (×2): qty 2

## 2021-08-14 MED ORDER — MAGNESIUM HYDROXIDE 400 MG/5ML PO SUSP: 30.0000 mL | Freq: Every day | ORAL | Status: DC | PRN

## 2021-08-14 MED ORDER — MOMETASONE FURO-FORMOTEROL FUM 100-5 MCG/ACT IN AERO
2.0000 | INHALATION_SPRAY | Freq: Two times a day (BID) | RESPIRATORY_TRACT | Status: DC
  Administered 2021-08-14: 2 via RESPIRATORY_TRACT
  Filled 2021-08-14: qty 8.8

## 2021-08-14 MED ORDER — SODIUM CHLORIDE 0.9 % IV BOLUS
1000.0000 mL | Freq: Once | INTRAVENOUS | Status: AC
  Administered 2021-08-14: 1000 mL via INTRAVENOUS

## 2021-08-14 MED ORDER — ENOXAPARIN SODIUM 30 MG/0.3ML IJ SOSY
30.0000 mg | PREFILLED_SYRINGE | INTRAMUSCULAR | Status: DC
  Administered 2021-08-15 – 2021-08-16 (×2): 30 mg via SUBCUTANEOUS
  Filled 2021-08-14 (×2): qty 0.3

## 2021-08-14 MED ORDER — ACETAMINOPHEN 325 MG PO TABS
650.0000 mg | ORAL_TABLET | Freq: Four times a day (QID) | ORAL | Status: DC | PRN
  Administered 2021-08-15 – 2021-08-16 (×2): 650 mg via ORAL
  Filled 2021-08-14 (×2): qty 2

## 2021-08-14 MED ORDER — PANTOPRAZOLE SODIUM 40 MG PO TBEC
40.0000 mg | DELAYED_RELEASE_TABLET | Freq: Every day | ORAL | Status: DC
  Administered 2021-08-14 – 2021-08-16 (×3): 40 mg via ORAL
  Filled 2021-08-14 (×3): qty 1

## 2021-08-14 MED ORDER — VITAMIN D 25 MCG (1000 UNIT) PO TABS
2000.0000 [IU] | ORAL_TABLET | Freq: Every morning | ORAL | Status: DC
  Administered 2021-08-14 – 2021-08-16 (×3): 2000 [IU] via ORAL
  Filled 2021-08-14 (×3): qty 2

## 2021-08-14 MED ORDER — TRAMADOL HCL 50 MG PO TABS: 50.0000 mg | ORAL_TABLET | Freq: Two times a day (BID) | ORAL | Status: DC | PRN

## 2021-08-14 MED ORDER — CLONAZEPAM 0.5 MG PO TABS: 0.5000 mg | ORAL_TABLET | Freq: Every day | ORAL | Status: DC

## 2021-08-14 MED ORDER — GUAIFENESIN ER 600 MG PO TB12: 600.0000 mg | ORAL_TABLET | Freq: Two times a day (BID) | ORAL | Status: DC | PRN

## 2021-08-14 MED ORDER — BUSPIRONE HCL 10 MG PO TABS
10.0000 mg | ORAL_TABLET | Freq: Every morning | ORAL | Status: DC
  Administered 2021-08-14 – 2021-08-16 (×3): 10 mg via ORAL
  Filled 2021-08-14 (×3): qty 1

## 2021-08-14 MED ORDER — SODIUM CHLORIDE 0.9 % IV SOLN: INTRAVENOUS | Status: DC

## 2021-08-14 MED ORDER — INSULIN NPH (HUMAN) (ISOPHANE) 100 UNIT/ML ~~LOC~~ SUSP
0.0000 [IU] | Freq: Every day | SUBCUTANEOUS | Status: DC
  Administered 2021-08-14: 5 [IU] via SUBCUTANEOUS

## 2021-08-14 MED ORDER — LINACLOTIDE 145 MCG PO CAPS
290.0000 ug | ORAL_CAPSULE | Freq: Every day | ORAL | Status: DC
  Administered 2021-08-14 – 2021-08-16 (×3): 290 ug via ORAL
  Filled 2021-08-14 (×4): qty 2

## 2021-08-14 MED ORDER — CLOPIDOGREL BISULFATE 75 MG PO TABS
75.0000 mg | ORAL_TABLET | Freq: Every morning | ORAL | Status: DC
  Administered 2021-08-14 – 2021-08-16 (×3): 75 mg via ORAL
  Filled 2021-08-14 (×4): qty 1

## 2021-08-14 MED ORDER — CLONAZEPAM 0.5 MG PO TABS
1.0000 mg | ORAL_TABLET | Freq: Every day | ORAL | Status: DC
  Administered 2021-08-14: 1 mg via ORAL
  Filled 2021-08-14: qty 2

## 2021-08-14 MED ORDER — ROSUVASTATIN CALCIUM 20 MG PO TABS
20.0000 mg | ORAL_TABLET | Freq: Every day | ORAL | Status: DC
  Administered 2021-08-14 – 2021-08-16 (×3): 20 mg via ORAL
  Filled 2021-08-14 (×3): qty 1

## 2021-08-14 MED ORDER — DICYCLOMINE HCL 20 MG PO TABS
40.0000 mg | ORAL_TABLET | Freq: Three times a day (TID) | ORAL | Status: DC
  Administered 2021-08-14 – 2021-08-16 (×8): 40 mg via ORAL
  Filled 2021-08-14 (×10): qty 2

## 2021-08-14 MED ORDER — ENOXAPARIN SODIUM 40 MG/0.4ML IJ SOSY
40.0000 mg | PREFILLED_SYRINGE | INTRAMUSCULAR | Status: DC
Start: 2021-08-14 — End: 2021-08-14
  Administered 2021-08-14: 40 mg via SUBCUTANEOUS
  Filled 2021-08-14: qty 0.4

## 2021-08-14 MED ORDER — POTASSIUM CHLORIDE CRYS ER 20 MEQ PO TBCR
20.0000 meq | EXTENDED_RELEASE_TABLET | Freq: Two times a day (BID) | ORAL | Status: DC
  Administered 2021-08-14 – 2021-08-16 (×5): 20 meq via ORAL
  Filled 2021-08-14 (×5): qty 1

## 2021-08-14 MED ORDER — DARIFENACIN HYDROBROMIDE ER 7.5 MG PO TB24
7.5000 mg | ORAL_TABLET | Freq: Every day | ORAL | Status: DC
  Administered 2021-08-14 – 2021-08-16 (×3): 7.5 mg via ORAL
  Filled 2021-08-14 (×4): qty 1

## 2021-08-14 MED ORDER — ONDANSETRON HCL 4 MG/2ML IJ SOLN: 4.0000 mg | Freq: Four times a day (QID) | INTRAMUSCULAR | Status: DC | PRN

## 2021-08-14 MED ORDER — DONEPEZIL HCL 10 MG PO TABS
10.0000 mg | ORAL_TABLET | Freq: Every day | ORAL | Status: DC
  Administered 2021-08-14 – 2021-08-15 (×2): 10 mg via ORAL
  Filled 2021-08-14: qty 2
  Filled 2021-08-14: qty 1

## 2021-08-14 MED ORDER — INSULIN NPH (HUMAN) (ISOPHANE) 100 UNIT/ML ~~LOC~~ SUSP
15.0000 [IU] | Freq: Every day | SUBCUTANEOUS | Status: DC
  Administered 2021-08-14 – 2021-08-16 (×3): 15 [IU] via SUBCUTANEOUS
  Filled 2021-08-14 (×3): qty 10

## 2021-08-14 MED ORDER — ACETAMINOPHEN 650 MG RE SUPP: 650.0000 mg | Freq: Four times a day (QID) | RECTAL | Status: DC | PRN

## 2021-08-14 MED ORDER — ONDANSETRON HCL 4 MG PO TABS: 4.0000 mg | ORAL_TABLET | Freq: Four times a day (QID) | ORAL | Status: DC | PRN

## 2021-08-14 MED ORDER — GLIPIZIDE 10 MG PO TABS
20.0000 mg | ORAL_TABLET | Freq: Two times a day (BID) | ORAL | Status: DC
  Administered 2021-08-14 – 2021-08-16 (×5): 20 mg via ORAL
  Filled 2021-08-14 (×7): qty 2

## 2021-08-14 MED ORDER — ALBUTEROL SULFATE (2.5 MG/3ML) 0.083% IN NEBU: 2.5000 mg | INHALATION_SOLUTION | Freq: Four times a day (QID) | RESPIRATORY_TRACT | Status: DC | PRN

## 2021-08-14 NOTE — Assessment & Plan Note (Signed)
-   We will continue Klonopin and BuSpar.

## 2021-08-14 NOTE — Assessment & Plan Note (Signed)
-   We will continue statin therapy. 

## 2021-08-14 NOTE — Assessment & Plan Note (Signed)
-   We will continue Synthroid. 

## 2021-08-14 NOTE — H&P (Addendum)
[place]   PATIENT NAME: Tara Jackson    MR#:  [id number]  DATE OF BIRTH:  1940-02-12  DATE OF ADMISSION:  08/13/2021  PRIMARY CARE PHYSICIAN: Shon Baton, MD   Patient is coming from: Home  REQUESTING/REFERRING PHYSICIAN: Blue, Soijett A, PA-C  CHIEF COMPLAINT:   Chief Complaint  Patient presents with   Weakness    HISTORY OF PRESENT ILLNESS:  Tara Jackson is a 81 y.o. African-American female with medical history significant for anxiety, asthma, coronary artery disease, type diabetes mellitus with diabetic neuropathy and retinopathy, diastolic dysfunction, hypertension dyslipidemia, who presented to the ER with acute onset of generalized weakness and fatigue with associated dizziness for the last couple weeks, nausea and vomiting for the last week.  She has associated mild epigastric pain.  She sustained a fall when she lost her balance yesterday with no head injuries or other injuries.  She admits to mild diarrhea.  No melena or bright red bleeding per rectum.  She admits to mild blood-tinged vomitus occasionally.  She has tactile fever without chills.  She admits to urinary urgency without dysuria or oliguria or flank pain.  No chest pain or palpitations.  No cough or wheezing or dyspnea.  ED Course: When she came to the ER BP was 170/71 with otherwise normal vital signs.  Labs revealed hyperglycemia of 271 and a BN of 34 with creatinine of 2.4 compared to 20/1.5 to in June of last year.  CBC was within normal.  Urinalysis showed 100 protein 50 glucose rare bacteria and only 0-5 WBCs and 0-5 RBCs. EKG as reviewed by me : EKG showed ventricular paced rhythm with rate 68.  The patient was given 1 L bolus of IV normal saline.  She will be admitted to a medical bed for further evaluation and management.  PAST MEDICAL HISTORY:   Past Medical History:  Diagnosis Date   Anemia    Anxiety    Arthritis    Asthma    Brittle bone disease    CKD (chronic kidney disease)     Complication of anesthesia    hard to wake up after anesthesia, trouble turning head   Coronary artery disease, non-occlusive 2014   a. minimal CAD by cath in 2014 with 40% RI stenosis. b. low-risk NST in 11/2014.   Depression    Diabetes mellitus type 2, insulin dependent (HCC)    Diabetic neuropathy (HCC)    Diabetic retinopathy ([place])    NPDR OU   Diastolic dysfunction, left ventricle 11/30/14   EF 55%, grade 1 DD   DVT (deep venous thrombosis) ([place])    "18 in RLE; 7 LLE prior to PE" (03/24/2015)   Family history of adverse reaction to anesthesia    " the whole family is hard to wake up"   Glaucoma    H/O hiatal hernia    Hyperlipidemia    Hypertension associated with diabetes ([place])    Hypertensive retinopathy    OU   Hypothyroidism    Kidney stones    "passed them"   Macular degeneration    Exu OU   Memory loss 06/03/2014   On home oxygen therapy    "2L oxygen concentrator @ night"   Osteoporosis    Oxygen desaturation during sleep    wears 2 liters of oxygen at night    Palpitations 35years ago   Cardionet monitor - revealed mostly normal sinus rhythm, sinus bradycardia with first-degree A-V block heart rates mostly in  the 68s and 60s with some 70s. No arrhythmias, PVCs or PACs noted.   PE (pulmonary thromboembolism) ([place]) x 3, last one was ~ 2003   history of recurrent RLE DVT with PE - last PE ~>13 yrs; Maintained on Plavix   Pneumonia    Presence of permanent cardiac pacemaker    Restless legs syndrome    Sleep apnea    cpap disontinued; test done 07/14/2008 ordered per  Byrum   Spinal headache    Spinal stenosis    L 3, L 4 and L 5, and C 1, C 2 and C3   Stroke ([place]) 04-19-2013   tia and stroke. 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 s/p [employer] PPM in 03/2015 by Dr. Lovena Le secondary to sinus node dysfunction.    TIA (transient ischemic attack) March 2014    PAST SURGICAL HISTORY:   Past Surgical History:  Procedure Laterality  Date   ABDOMINAL HYSTERECTOMY     APPENDECTOMY     BACK SURGERY     steroid inj   CATARACT EXTRACTION Bilateral    CATARACT EXTRACTION W/ INTRAOCULAR LENS  IMPLANT, BILATERAL Bilateral 2000s   CHOLECYSTECTOMY     EP IMPLANTABLE DEVICE N/A 03/24/2015   [facility] [employer] PPM, Dr. Lovena Le   ESOPHAGOGASTRODUODENOSCOPY  08/09/2011   Procedure: ESOPHAGOGASTRODUODENOSCOPY (EGD);  Surgeon: Jeryl Columbia, MD;  Location: [facility];  Service: Endoscopy;  Laterality: N/A;   ESOPHAGOGASTRODUODENOSCOPY (EGD) WITH PROPOFOL N/A 11/12/2013   Procedure: ESOPHAGOGASTRODUODENOSCOPY (EGD) WITH PROPOFOL;  Surgeon: Jeryl Columbia, MD;  Location: [facility] ENDOSCOPY;  Service: Endoscopy;  Laterality: N/A;   ESOPHAGOGASTRODUODENOSCOPY (EGD) WITH PROPOFOL N/A 05/05/2016   Procedure: ESOPHAGOGASTRODUODENOSCOPY (EGD) WITH PROPOFOL;  Surgeon: Clarene Essex, MD;  Location: [facility] ENDOSCOPY;  Service: Endoscopy;  Laterality: N/A;   EYE SURGERY Bilateral    Cat Sx OU   GLAUCOMA SURGERY Bilateral 2000s   "laser"   HOT HEMOSTASIS  08/09/2011   Procedure: HOT HEMOSTASIS (ARGON PLASMA COAGULATION/BICAP);  Surgeon: Jeryl Columbia, MD;  Location: [facility];  Service: Endoscopy;  Laterality: N/A;   HOT HEMOSTASIS N/A 11/12/2013   Procedure: HOT HEMOSTASIS (ARGON PLASMA COAGULATION/BICAP);  Surgeon: Jeryl Columbia, MD;  Location: [facility] ENDOS[facility];  Service: Endoscopy;  Laterality: N/A;   INSERT / REPLACE / REMOVE PACEMAKER  03/24/2015   IRIDOTOMY / IRIDECTOMY Bilateral    JOINT REPLACEMENT     KNEE SURGERY Left done with 2nd left knee replacement   spacer bar placement   LEFT HEART CATHETERIZATION WITH CORONARY ANGIOGRAM N/A 02/14/2012   Procedure: LEFT HEART CATHETERIZATION WITH CORONARY ANGIOGRAM;  Surgeon: Leonie Man, MD;  Location: [facility] CATH LAB;  Service: Cardiovascular:: no evidence of obstructive coronary disease ,low EDP with normal EF   LEV DOPPLER  05/29/2012   RIGHT EXTREM. NORMAL VENOUS DUPLEX   NM MYOCAR PERF WALL MOTION  08/2019   EF  55 to 65%.  No ischemia or infarction.  LOW RISK:   PACEMAKER PLACEMENT     REVISION TOTAL KNEE ARTHROPLASTY Left    TONSILLECTOMY     TOTAL KNEE ARTHROPLASTY Bilateral    TRANSTHORACIC ECHOCARDIOGRAM  11/2017   11/2017: EF 60-65%. Gr1 DD. PAP ~33 mmHg.  Mild TR. PPM leads in RA & RV.    TRIGGER FINGER RELEASE  11/22/2011   Procedure: RELEASE TRIGGER FINGER/A-1 PULLEY;  Surgeon: Meredith Pel, MD;  Location: [facility];  Service: Orthopedics;  Laterality: Left;  Left trigger thumb release   TRIGGER FINGER RELEASE  Right yrs ago    SOCIAL HISTORY:   Social History   Tobacco Use   Smoking status: Never    Passive exposure: Yes   Smokeless tobacco: Never  Substance Use Topics   Alcohol use: No    Alcohol/week: 0.0 standard drinks of alcohol    FAMILY HISTORY:   Family History  Problem Relation Age of Onset   Cancer Mother    Diabetes Mother    Cancer - Prostate Father    Diabetes Father    Retinal detachment Son    Diabetes Sister    Diabetes Brother    Diabetes Paternal Grandmother     DRUG ALLERGIES:   Allergies  Allergen Reactions   Iodine Other (See Comments)    ONLY IV--Makes unconscious   Iohexol Other (See Comments)     Code: SOB, Desc: cardiac arrest w/ iv contrast, has never used 13 hr prep//alice calhoun, Onset Date: [id number]    Metoclopramide Hcl Other (See Comments)    suicidal   Sulfa Antibiotics Hives   Lactose Intolerance (Gi) Diarrhea   Lansoprazole Nausea And Vomiting    REVIEW OF SYSTEMS:   ROS As per history of present illness. All pertinent systems were reviewed above. Constitutional, HEENT, cardiovascular, respiratory, GI, GU, musculoskeletal, neuro, psychiatric, endocrine, integumentary and hematologic systems were reviewed and are otherwise negative/unremarkable except for positive findings mentioned above in the HPI.   MEDICATIONS AT HOME:   Prior to Admission medications   Medication Sig Start Date End Date Taking? Authorizing  Provider  acetaminophen (TYLENOL) 325 MG tablet Take 2 tablets (650 mg total) by mouth every 6 (six) hours as needed for up to 30 doses for mild pain or moderate pain. 07/21/20   Wyvonnia Dusky, MD  albuterol (VENTOLIN HFA) 108 (90 Base) MCG/ACT inhaler INHALE 2 INHALATIONS 3 TIMES A DAY AS NEEDED FOR COUGH/WHEEZE 12/23/20   Tanda Rockers, MD  busPIRone (BUSPAR) 10 MG tablet Take 1 tablet (10 mg total) by mouth every morning. 06/03/14   Garvin Fila, MD  Cholecalciferol (VITAMIN D-3) 25 MCG (1000 UT) CAPS Take 2,000 Units by mouth every morning.    [provider]  clonazePAM (KLONOPIN) 0.5 MG tablet Take 0.5-1 mg by mouth See admin instructions. Takes '1mg'$  in am and 0.'5mg'$  in pm.    [provider]  clopidogrel (PLAVIX) 75 MG tablet Take 75 mg by mouth every morning.    [provider]  dicyclomine (BENTYL) 20 MG tablet Take 40 mg by mouth in the morning and at bedtime.    [provider]  donepezil (ARICEPT) 10 MG tablet TAKE 1 TABLET BY MOUTH EVERYDAY AT BEDTIME 04/26/21   Garvin Fila, MD  esomeprazole (NEXIUM) 40 MG capsule Take 40 mg by mouth daily. 06/08/20   [provider]  furosemide (LASIX) 20 MG tablet TAKE 1 TABLET (20 MG TOTAL) BY MOUTH DAILY AS NEEDED FOR FLUID OR EDEMA. AS DIRECTED 06/04/21   Leonie Man, MD  glipiZIDE (GLUCOTROL) 10 MG tablet Take 20 mg by mouth 2 (two) times daily before a meal.  05/03/14   [provider]  guaiFENesin (MUCINEX) 600 MG 12 hr tablet Take 600 mg by mouth 2 (two) times daily as needed for cough.    [provider]  insulin NPH (HUMULIN N,NOVOLIN N) 100 UNIT/ML injection Inject 5-20 Units into the skin See admin instructions. In the morning:  If cbg >300, then give 20 units. If cbg < or = 300, then give  15 unit At night: If cbg >250, then give 5 units. If cbg < or = 250, then give no insulin    [provider]  insulin regular (NOVOLIN R,HUMULIN R) 100 units/mL injection  Inject 0-30 Units into the skin See admin instructions. At breakfast: if cbg < 300, then give 25 units. If cbg > or = 300, then give 30 units. After dinner: goal cbgs is 150 and give insulin dose to reach this. For every 100 cbg over 150, then give 10 units of insulin.    [provider]  levothyroxine (SYNTHROID) 100 MCG tablet Take 100 mcg by mouth daily before breakfast.    [provider]  lidocaine (LIDODERM) 5 % Place 2 patches onto the skin daily as needed (pain). Remove & Discard patch within 12 hours or as directed by MD    [provider]  linaclotide (LINZESS) 290 MCG CAPS capsule Take 290 mcg by mouth daily before breakfast.    [provider]  Menthol-Methyl Salicylate (MUSCLE RUB) 10-15 % CREA Apply 1 application topically as needed for muscle pain.    [provider]  nortriptyline (PAMELOR) 50 MG capsule Take 1 capsule (50 mg total) by mouth 2 (two) times daily. 11/24/20   Garvin Fila, MD  OXYGEN Inhale 2 L into the lungs as needed. Used at overnight with a Cytogeneticist, Historical, MD  pantoprazole (PROTONIX) 40 MG tablet TAKE 1 TABLET BY MOUTH EVERY DAY 08/03/20   Leonie Man, MD  potassium chloride SA (KLOR-CON M) 20 MEQ tablet Take 20 mEq by mouth 2 (two) times daily. Take 1/2 tablet twice daily.    [provider]  rosuvastatin (CRESTOR) 20 MG tablet Take 20 mg by mouth daily.  03/13/17   [provider]  solifenacin (VESICARE) 5 MG tablet Take 5 mg by mouth daily as needed (to prevent UTI). 02/24/19   [provider]  SYMBICORT 80-4.5 MCG/ACT inhaler INHALE 2 PUFFS FIRST THING IN THE MORNING THEN ANOTHER 2 PUFFS ABOUT 12 HOURS LATER 10/27/20   Tanda Rockers, MD  terconazole (TERAZOL 7) 0.4 % vaginal cream Place 1 applicator vaginally daily as needed for itching. 02/12/19   [provider]  traMADol (ULTRAM) 50 MG tablet Take 1 tablet (50 mg total) by mouth every 12 (twelve) hours as  needed for up to 20 doses. 07/29/20   Mbemena, Arnoldo Morale, NP      VITAL SIGNS:  Blood pressure 123/70, pulse 71, temperature 98.3 F (36.8 C), temperature source Oral, resp. rate 17, weight 103 kg, SpO2 99 %.  PHYSICAL EXAMINATION:  Physical Exam  GENERAL:  81 y.o.-year-old African-American female patient lying in the bed with no acute distress.  EYES: Pupils equal, round, reactive to light and accommodation. No scleral icterus. Extraocular muscles intact.  HEENT: Head atraumatic, normocephalic. Oropharynx and nasopharynx clear.  NECK:  Supple, no jugular venous distention. No thyroid enlargement, no tenderness.  LUNGS: Normal breath sounds bilaterally, no wheezing, rales,rhonchi or crepitation. No use of accessory muscles of respiration.  CARDIOVASCULAR: Regular rate and rhythm, S1, S2 normal. No murmurs, rubs, or gallops.  ABDOMEN: Soft, nondistended, nontender. Bowel sounds present. No organomegaly or mass.  EXTREMITIES: No pedal edema, cyanosis, or clubbing.  NEUROLOGIC: Cranial nerves II through XII are intact. Muscle strength 5/5 in all extremities. Sensation intact. Gait not checked.  PSYCHIATRIC: The patient is alert and oriented x 3.  Normal affect and good eye contact. SKIN: No obvious rash, lesion,  or ulcer.   LABORATORY PANEL:   CBC Recent Labs  Lab 08/14/21 0504  WBC 8.9  HGB 12.2  HCT 36.8  PLT 224   ------------------------------------------------------------------------------------------------------------------  Chemistries  Recent Labs  Lab 08/14/21 0504  NA 138  K 3.2*  CL 103  CO2 26  GLUCOSE 197*  BUN 31*  CREATININE 2.33*  CALCIUM 9.0   ------------------------------------------------------------------------------------------------------------------  Cardiac Enzymes No results for input(s): "TROPONINI" in the last 168  hours. ------------------------------------------------------------------------------------------------------------------  RADIOLOGY:  No results found.    IMPRESSION AND PLAN:  Assessment and Plan: * Acute kidney injury superimposed on chronic kidney disease ([place]) - The patient will be admitted to a medical bed. - This likely prerenal due to volume depletion and dehydration from recurrent nausea and vomiting. - We will continue hydration with IV normal saline. - We will avoid nephrotoxins. - We will follow BMP.  Acute gastroenteritis - This is manifested by intractable nausea and vomiting with occasional diarrhea. - The patient will be hydrated with IV normal saline as mentioned above. - As needed antiemetics and antidiarrheals will be provided. - IV PPI therapy will be given.  Fall at home, initial encounter - The patient had no injuries. - Physical therapy evaluation will be obtained to assess her ambulation.  Dyslipidemia - We will continue statin therapy.  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 [facility]) - The patient will be placed on supplement coverage with NovoLog. - We will continue her basal coverage.  Hypothyroidism, unspecified - We will continue Synthroid.  Hypothyroidism - We will continue Synthroid.   DVT prophylaxis: Lovenox.  Advanced Care Planning:  Code Status: full code.  She would like to further think about it. Family Communication:  The plan of care was discussed in details with the patient (and family). I answered all questions. The patient agreed to proceed with the above mentioned plan. Further management will depend upon hospital course. Disposition Plan: Back to previous home environment Consults called: none.  All the records are reviewed and case discussed with ED provider.  Status is: Inpatient   At the time of the admission, it appears that the appropriate admission status for this patient is inpatient.  This is  judged to be reasonable and necessary in order to provide the required intensity of service to ensure the patient's safety given the presenting symptoms, physical exam findings and initial radiographic and laboratory data in the context of comorbid conditions.  The patient requires inpatient status due to high intensity of service, high risk of further deterioration and high frequency of surveillance required.  I certify that at the time of admission, it is my clinical judgment that the patient will require inpatient hospital care extending more than 2 midnights.                            Dispo: The patient is from: Home              Anticipated d/c is to: Home              Patient currently is not medically stable to d/c.              Difficult to place patient: No  Christel Mormon M.D on 08/14/2021 at 5:42 AM  [facility]   From 7 PM-7 AM, contact night-coverage [email or web address]  CC: Primary care physician; Shon Baton, MD

## 2021-08-14 NOTE — Progress Notes (Signed)
Patient seen and examined after midnight  81 year old black female known sick sinus syndrome + PPM 03/2015 HFpEF-nonobstructive CAD on cath 2014 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-on Plavix Spinal stenosis 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 versus significant B12 deficiency DM TY 2+ CKD 3B, hypothyroid, hypertension  Presented to ER with generalized weakness fatigue for several weeks complicated by fall 1/5-XYV some mild diarrhea as well no melena occasional vomit Labs showed BUN/creatinine 34/2.4 compared to prior 20/1.5  Patient hydrated with saline felt to have acute gastroenteritis and was admitted   S quite sleepy cannot really give me much history nursing reports was much more coherent earlier prior to morning meds Was able to eat breakfast and drink some fluids but then felt sleepy after meds  O/e BP 128/67   Pulse 72   Temp 98.3 F (36.8 C) (Oral)   Resp 16   Wt 103 kg   SpO2 94%   BMI 34.78 kg/m  Sleepy black female no distress mumbles Chest clear no rales rhonchi Abdomen soft  P Diarrheal illness--- follow trends of presentation Encephalopathy likely secondary to meds.  Would stop tramadol, stop benzodiazepine Reevaluate in a.m.  If labs are improved and patient is ambulatory probably can discharge 24 to 48 hours  Verneita Griffes, MD [facility] 11:26 AM

## 2021-08-14 NOTE — ED Notes (Signed)
Pt given breakfast tray and set up to eat.

## 2021-08-14 NOTE — ED Provider Notes (Signed)
[place] DEPT Provider Note   CSN: [id number] Arrival date & time: 08/13/21  2208     History  Chief Complaint  Patient presents with   Weakness    Tara Jackson is a 81 y.o. female with a PMHx of CKD, type 2 diabetes, hypertension, hyperlipidemia, who presents to the ED complaining of generalized weakness onset 2 days. Husband notes that patient fell today however did not hit her head.  Denies LOC.  Has associated fatigue, appetite change, dysuria, generalized weakness, nausea, vomiting.  No meds tried prior to arrival. Denies SOB, LOC.   The history is provided by the patient. No language interpreter was used.       Home Medications Prior to Admission medications   Medication Sig Start Date End Date Taking? Authorizing Provider  acetaminophen (TYLENOL) 325 MG tablet Take 2 tablets (650 mg total) by mouth every 6 (six) hours as needed for up to 30 doses for mild pain or moderate pain. 07/21/20   Wyvonnia Dusky, MD  albuterol (VENTOLIN HFA) 108 (90 Base) MCG/ACT inhaler INHALE 2 INHALATIONS 3 TIMES A DAY AS NEEDED FOR COUGH/WHEEZE 12/23/20   Tanda Rockers, MD  busPIRone (BUSPAR) 10 MG tablet Take 1 tablet (10 mg total) by mouth every morning. 06/03/14   Garvin Fila, MD  Cholecalciferol (VITAMIN D-3) 25 MCG (1000 UT) CAPS Take 2,000 Units by mouth every morning.    [provider]  clonazePAM (KLONOPIN) 0.5 MG tablet Take 0.5-1 mg by mouth See admin instructions. Takes '1mg'$  in am and 0.'5mg'$  in pm.    [provider]  clopidogrel (PLAVIX) 75 MG tablet Take 75 mg by mouth every morning.    [provider]  dicyclomine (BENTYL) 20 MG tablet Take 40 mg by mouth in the morning and at bedtime.    [provider]  donepezil (ARICEPT) 10 MG tablet TAKE 1 TABLET BY MOUTH EVERYDAY AT BEDTIME 04/26/21   Garvin Fila, MD  esomeprazole (NEXIUM) 40 MG capsule Take 40 mg by mouth daily. 06/08/20   [provider]   furosemide (LASIX) 20 MG tablet TAKE 1 TABLET (20 MG TOTAL) BY MOUTH DAILY AS NEEDED FOR FLUID OR EDEMA. AS DIRECTED 06/04/21   Leonie Man, MD  glipiZIDE (GLUCOTROL) 10 MG tablet Take 20 mg by mouth 2 (two) times daily before a meal.  05/03/14   [provider]  guaiFENesin (MUCINEX) 600 MG 12 hr tablet Take 600 mg by mouth 2 (two) times daily as needed for cough.    [provider]  insulin NPH (HUMULIN N,NOVOLIN N) 100 UNIT/ML injection Inject 5-20 Units into the skin See admin instructions. In the morning:  If cbg >300, then give 20 units. If cbg < or = 300, then give 15 unit At night: If cbg >250, then give 5 units. If cbg < or = 250, then give no insulin    [provider]  insulin regular (NOVOLIN R,HUMULIN R) 100 units/mL injection Inject 0-30 Units into the skin See admin instructions. At breakfast: if cbg < 300, then give 25 units. If cbg > or = 300, then give 30 units. After dinner: goal cbgs is 150 and give insulin dose to reach this. For every 100 cbg over 150, then give 10 units of insulin.    [provider]  levothyroxine (SYNTHROID) 100 MCG tablet Take 100 mcg by mouth daily before breakfast.    [provider]  lidocaine (LIDODERM) 5 % Place  2 patches onto the skin daily as needed (pain). Remove & Discard patch within 12 hours or as directed by MD    [provider]  linaclotide (LINZESS) 290 MCG CAPS capsule Take 290 mcg by mouth daily before breakfast.    [provider]  Menthol-Methyl Salicylate (MUSCLE RUB) 10-15 % CREA Apply 1 application topically as needed for muscle pain.    [provider]  nortriptyline (PAMELOR) 50 MG capsule Take 1 capsule (50 mg total) by mouth 2 (two) times daily. 11/24/20   Garvin Fila, MD  OXYGEN Inhale 2 L into the lungs as needed. Used at overnight with a Cytogeneticist, Historical, MD  pantoprazole (PROTONIX) 40 MG tablet TAKE 1 TABLET BY MOUTH EVERY DAY  08/03/20   Leonie Man, MD  potassium chloride SA (KLOR-CON M) 20 MEQ tablet Take 20 mEq by mouth 2 (two) times daily. Take 1/2 tablet twice daily.    [provider]  rosuvastatin (CRESTOR) 20 MG tablet Take 20 mg by mouth daily.  03/13/17   [provider]  solifenacin (VESICARE) 5 MG tablet Take 5 mg by mouth daily as needed (to prevent UTI). 02/24/19   [provider]  SYMBICORT 80-4.5 MCG/ACT inhaler INHALE 2 PUFFS FIRST THING IN THE MORNING THEN ANOTHER 2 PUFFS ABOUT 12 HOURS LATER 10/27/20   Tanda Rockers, MD  terconazole (TERAZOL 7) 0.4 % vaginal cream Place 1 applicator vaginally daily as needed for itching. 02/12/19   [provider]  traMADol (ULTRAM) 50 MG tablet Take 1 tablet (50 mg total) by mouth every 12 (twelve) hours as needed for up to 20 doses. 07/29/20   Mbemena, Arnoldo Morale, NP      Allergies    Iodine, Iohexol, Metoclopramide hcl, Sulfa antibiotics, Lactose intolerance (gi), and Lansoprazole    Review of Systems   Review of Systems  Constitutional:  Positive for appetite change and fatigue.  Respiratory:  Negative for shortness of breath.   Gastrointestinal:  Positive for nausea and vomiting.  Genitourinary:  Positive for dysuria.  Neurological:  Positive for weakness. Negative for syncope.  All other systems reviewed and are negative.   Physical Exam Updated Vital Signs BP (!) 143/69   Pulse 60   Temp 98.3 F (36.8 C) (Oral)   Resp 16   Wt 103 kg   SpO2 100%   BMI 34.78 kg/m  Physical Exam Vitals and nursing note reviewed.  Constitutional:      General: She is not in acute distress.    Appearance: She is not diaphoretic.  HENT:     Head: Normocephalic and atraumatic.     Mouth/Throat:     Pharynx: No oropharyngeal exudate.  Eyes:     General: No scleral icterus.    Conjunctiva/sclera: Conjunctivae normal.  Cardiovascular:     Rate and Rhythm: Normal rate and regular rhythm.     Pulses: Normal pulses.     Heart  sounds: Normal heart sounds.  Pulmonary:     Effort: Pulmonary effort is normal. No respiratory distress.     Breath sounds: Normal breath sounds. No wheezing.  Abdominal:     General: Bowel sounds are normal.     Palpations: Abdomen is soft. There is no mass.     Tenderness: There is no abdominal tenderness. There is no guarding or rebound.  Musculoskeletal:        General: Normal range of motion.     Cervical back: Normal range of  motion and neck supple.     Comments: Strength and sensation intact to bilateral upper and lower extremities.  Skin:    General: Skin is warm and dry.  Neurological:     General: No focal deficit present.     Mental Status: She is alert.     Sensory: Sensation is intact.     Motor: No pronator drift.  Psychiatric:        Behavior: Behavior normal.     ED Results / Procedures / Treatments   Labs (all labs ordered are listed, but only abnormal results are displayed) Labs Reviewed  BASIC METABOLIC PANEL - Abnormal; Notable for the following components:      Result Value   Glucose, Bld 271 (*)    BUN 34 (*)    Creatinine, Ser 2.40 (*)    GFR, Estimated 20 (*)    All other components within normal limits  CBG MONITORING, ED - Abnormal; Notable for the following components:   Glucose-Capillary 268 (*)    All other components within normal limits  CBC  URINALYSIS, ROUTINE W REFLEX MICROSCOPIC    EKG EKG Interpretation  Date/Time:  Friday August 13 2021 23:45:15 EDT Ventricular Rate:  68 PR Interval:  299 QRS Duration: 197 QT Interval:  556 QTC Calculation: 592 R Axis:   262 Text Interpretation: Ventricular-paced rhythm Since last tracing VENTRICULAR PACING has replaced Sinus rhythm Confirmed by Calvert Cantor [phone]) on 08/13/2021 11:57:09 PM  Radiology No results found.  Procedures Procedures    Medications Ordered in ED Medications  traMADol (ULTRAM) tablet 50 mg (has no administration in time range)  rosuvastatin (CRESTOR) tablet  20 mg (has no administration in time range)  busPIRone (BUSPAR) tablet 10 mg (has no administration in time range)  donepezil (ARICEPT) tablet 10 mg (has no administration in time range)  glipiZIDE (GLUCOTROL) tablet 20 mg (has no administration in time range)  insulin NPH Human (NOVOLIN N) injection 5-20 Units (has no administration in time range)  levothyroxine (SYNTHROID) tablet 100 mcg (has no administration in time range)  dicyclomine (BENTYL) tablet 40 mg (has no administration in time range)  pantoprazole (PROTONIX) EC tablet 40 mg (has no administration in time range)  linaclotide (LINZESS) capsule 290 mcg (has no administration in time range)  darifenacin (ENABLEX) 24 hr tablet 7.5 mg (has no administration in time range)  clopidogrel (PLAVIX) tablet 75 mg (has no administration in time range)  clonazePAM (KLONOPIN) tablet 0.5-1 mg (has no administration in time range)  Vitamin D-3 CAPS 2,000 Units (has no administration in time range)  potassium chloride SA (KLOR-CON M) CR tablet 20 mEq (has no administration in time range)  albuterol (VENTOLIN HFA) 108 (90 Base) MCG/ACT inhaler 1-2 puff (has no administration in time range)  guaiFENesin (MUCINEX) 12 hr tablet 600 mg (has no administration in time range)  mometasone-formoterol (DULERA) 100-5 MCG/ACT inhaler 2 puff (has no administration in time range)  lidocaine (LIDODERM) 5 % 2 patch (has no administration in time range)  enoxaparin (LOVENOX) injection 40 mg (has no administration in time range)  0.9 %  sodium chloride infusion (has no administration in time range)  acetaminophen (TYLENOL) tablet 650 mg (has no administration in time range)    Or  acetaminophen (TYLENOL) suppository 650 mg (has no administration in time range)  magnesium hydroxide (MILK OF MAGNESIA) suspension 30 mL (has no administration in time range)  ondansetron (ZOFRAN) tablet 4 mg (has no administration in time range)    Or  ondansetron [facility]) injection 4  mg (has no administration in time range)  sodium chloride 0.9 % bolus 1,000 mL (1,000 mLs Intravenous New Bag/Given 08/14/21 3154)    ED Course/ Medical Decision Making/ A&P Clinical Course as of 08/14/21 0359  Sat Aug 14, 2021  0313 Consult to hospitalist, Dr. Sidney Ace who agrees with admission at this time.  [SB]    Clinical Course User Index [SB] Rhea Thrun A, PA-C                            Medical Decision Making Amount and/or Complexity of Data Reviewed Labs: ordered.  Risk Decision regarding hospitalization.   Pt presents with generalized weakness onset 2 days.  Husband notes that patient fell today however she denies hitting her head or LOC.  Patient afebrile.  On exam patient with no acute cardiovascular, respiratory, abdominal exam findings.  Strength sensation intact to bilateral upper and lower extremities.  Cranial nerves II through XII intact.  No focal neurological deficits.  Differential diagnosis includes anemia, arrhythmia, electrolyte abnormality, acute cystitis.    Co morbidities that complicate the patient evaluation: Hyperlipidemia. Hypertension. CKD. Type 2 diabetes  Additional history obtained:  Additional history obtained from Spouse/Significant Other  Labs:  I ordered, and personally interpreted labs.  The pertinent results include:   CBG with glucose at 268. BMP with slightly elevated glucose of 271, creatinine elevated at 2.4, BUN elevated at 34, GFR decreased to 20 otherwise unremarkable. CBC unremarkable. Urinalysis notable for moderate amount of hemoglobin otherwise nitrate and leukocyte negative.  Medications:  I ordered medication including IV fluids for hydration I have reviewed the patients home medicines and have made adjustments as needed  Consultations: I requested consultation with the Hospitalist, Dr. Sidney Ace and discussed lab and imaging findings as well as pertinent plan - they recommend: Admission for  observation  Disposition: Presentation suspicious for generalized weakness, likely in the setting of worsening AKI.  Unsure of patient's baseline BUN and creatinine levels as previous values are over a year ago.  Doubt acute cystitis, anemia, arrhythmia, electrolyte abnormality at this time.  After consideration of the diagnostic results and the patients response to treatment, I feel that the patient would benefit from Admission to the hospital.  Discussed admission with patient and husband at bedside.  Answered all available questions.  Patient appears safe for admission at this time.   This chart was dictated using voice recognition software, Dragon. Despite the best efforts of this provider to proofread and correct errors, errors may still occur which can change documentation meaning.  Final Clinical Impression(s) / ED Diagnoses Final diagnoses:  AKI (acute kidney injury) ([place])  Weakness    Rx / DC Orders ED Discharge Orders     None         Auden Tatar A, PA-C 08/14/21 0401    Truddie Hidden, MD 08/14/21 [phone]

## 2021-08-14 NOTE — Assessment & Plan Note (Signed)
-   The patient had no injuries. - Physical therapy evaluation will be obtained to assess her ambulation.

## 2021-08-14 NOTE — ED Notes (Signed)
Pt ate 50% of breakfast tray.  See MAR for insulin admin

## 2021-08-14 NOTE — Assessment & Plan Note (Signed)
-   This is manifested by intractable nausea and vomiting with occasional diarrhea. - The patient will be hydrated with IV normal saline as mentioned above. - As needed antiemetics and antidiarrheals will be provided. - IV PPI therapy will be given.

## 2021-08-14 NOTE — Assessment & Plan Note (Addendum)
-   The patient will be admitted to a medical bed. - This likely prerenal due to volume depletion and dehydration from recurrent nausea and vomiting. - We will continue hydration with IV normal saline. - We will avoid nephrotoxins. - We will follow BMP.

## 2021-08-14 NOTE — Assessment & Plan Note (Signed)
-   The patient will be placed on supplement coverage with NovoLog. - We will continue her basal coverage. 

## 2021-08-15 DIAGNOSIS — N189 Chronic kidney disease, unspecified: Secondary | ICD-10-CM | POA: Diagnosis not present

## 2021-08-15 DIAGNOSIS — N179 Acute kidney failure, unspecified: Secondary | ICD-10-CM | POA: Diagnosis not present

## 2021-08-15 LAB — CBG MONITORING, ED
Glucose-Capillary: 280 mg/dL — ABNORMAL HIGH (ref 70–99)
Glucose-Capillary: 240 mg/dL — ABNORMAL HIGH (ref 70–99)

## 2021-08-15 LAB — BASIC METABOLIC PANEL
Anion gap: 8 (ref 5–15)
BUN: 28 mg/dL — ABNORMAL HIGH (ref 8–23)
CO2: 27 mmol/L (ref 22–32)
Calcium: 8.9 mg/dL (ref 8.9–10.3)
Chloride: 103 mmol/L (ref 98–111)
Creatinine, Ser: 2.01 mg/dL — ABNORMAL HIGH (ref 0.44–1.00)
GFR, Estimated: 24 mL/min — ABNORMAL LOW (ref >60)
Glucose, Bld: 288 mg/dL — ABNORMAL HIGH (ref 70–99)
Potassium: 3.8 mmol/L (ref 3.5–5.1)
Sodium: 138 mmol/L (ref 135–145)

## 2021-08-15 LAB — GLUCOSE, CAPILLARY
Glucose-Capillary: 213 mg/dL — ABNORMAL HIGH (ref 70–99)
Glucose-Capillary: 216 mg/dL — ABNORMAL HIGH (ref 70–99)
Glucose-Capillary: 237 mg/dL — ABNORMAL HIGH (ref 70–99)
Glucose-Capillary: 276 mg/dL — ABNORMAL HIGH (ref 70–99)

## 2021-08-15 LAB — CBC
HCT: 36.6 % (ref 36.0–46.0)
Hemoglobin: 11.5 g/dL — ABNORMAL LOW (ref 12.0–15.0)
MCH: 30.9 pg (ref 26.0–34.0)
MCHC: 31.4 g/dL (ref 30.0–36.0)
MCV: 98.4 fL (ref 80.0–100.0)
Platelets: 221 10*3/uL (ref 150–400)
RBC: 3.72 MIL/uL — ABNORMAL LOW (ref 3.87–5.11)
RDW: 12.3 % (ref 11.5–15.5)
WBC: 7.1 10*3/uL (ref 4.0–10.5)
nRBC: 0 % (ref 0.0–0.2)

## 2021-08-15 MED ORDER — CLONAZEPAM 0.5 MG PO TABS
0.5000 mg | ORAL_TABLET | Freq: Once | ORAL | Status: AC
Start: 2021-08-16 — End: 2021-08-16
  Administered 2021-08-16: 0.5 mg via ORAL
  Filled 2021-08-15: qty 1

## 2021-08-15 NOTE — Progress Notes (Signed)
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 versus significant B12 deficiency DM TY 2+ CKD 3B, hypothyroid, hypertension   Presented to ER with generalized weakness fatigue for several weeks complicated by fall 7/0-WCB some mild diarrhea as well no melena occasional vomit Labs showed BUN/creatinine 34/2.4 compared to prior 20/1.5   Patient hydrated with saline felt to have acute gastroenteritis and was admitted  Hospital-Problem based course  Gastroenteritis prior to admission Symptoms seem to have resolved she is tolerating a full diet, Allow diet AKI on admission secondary to gastroenteritis, Creatinine peaked at 2.4 Currently trending downwards-continue NS 100 cc/H and watch nephrotoxins We have held Lasix from prior to admission 20 Metabolic encephalopathy during hospital stay Received several medications (Pamelor, tramadol) causing sleepiness Those medications stopped-mentation has improved Monitor on Klonopin 0.5 twice daily diabetes mellitus type 2 CBGs uncontrolled 2 40-2 80 range Continue glipizide 20 twice daily, NPH 15-20 before breakfast and NPH 5 units at bedtime May have this dosing probably because she might develop hypoglycemia-careful adjustment in the next day or so Prior CVA Continue Plavix 75 every morning Recurrent DVT not on anticoagulation As above Nonobstructive CAD, HFpEF Lasix held-on no other meds Depression Continue BuSpar 10 AM  DVT prophylaxis: Lovenox Code Status: Full Family Communication: None present Disposition:  Status is: Inpatient Remains inpatient appropriate because:   Creatinine still elevated and patient barely ambulatory so needs PT eval   Consultants:   None  Procedures: No  Antimicrobials: No   Subjective:  Awake coherent more arousable today no distress No fever no chills no nausea no vomiting-had breakfast  Objective: Vitals:   08/15/21 0715 08/15/21 0730 08/15/21 0733 08/15/21 0811  BP:  (!) 161/64  (!) 165/69  Pulse: 66 66  67  Resp: '18 17  16  '$ Temp:   97.9 F (36.6 C) 97.9 F (36.6 C)  TempSrc:   Oral   SpO2: 100% 100%  100%  Weight:    104.6 kg  Height:    '5\' 7"'$  (1.702 m)    Intake/Output Summary (Last 24 hours) at 08/15/2021 1401 Last data filed at 08/15/2021 1219 Gross per 24 hour  Intake 240 ml  Output --  Net 240 ml   Filed Weights   08/13/21 2219 08/15/21 0811  Weight: 103 kg 104.6 kg    Examination:  Awake coherent no distress EOMI NCAT no focal deficit S1-S2 no murmur no rub no gallop Chest is clear no added sound rales rhonchi wheeze Abdomen soft nontender no rebound no guarding Trace lower extremity edema Neurologically intact moving 4 limbs  Data Reviewed: personally reviewed   CBC    Component Value Date/Time   WBC 7.1 08/15/2021 0448   RBC 3.72 (L) 08/15/2021 0448   HGB 11.5 (L) 08/15/2021 0448   HCT 36.6 08/15/2021 0448   PLT 221 08/15/2021 0448   MCV 98.4 08/15/2021 0448   MCH 30.9 08/15/2021 0448   MCHC 31.4 08/15/2021 0448   RDW 12.3 08/15/2021 0448   LYMPHSABS 1.8 04/11/2020 0328   MONOABS 0.4 04/11/2020 0328   EOSABS 0.1 04/11/2020 0328   BASOSABS 0.0 04/11/2020 0328      Latest Ref Rng & Units 08/15/2021    4:48 AM 08/14/2021    5:04 AM 08/13/2021  10:30 PM  CMP  Glucose 70 - 99 mg/dL 288  197  271   BUN 8 - 23 mg/dL 28  31  34   Creatinine 0.44 - 1.00 mg/dL 2.01  2.33  2.40   Sodium 135 - 145 mmol/L 138  138  138   Potassium 3.5 - 5.1 mmol/L 3.8  3.2  3.6   Chloride 98 - 111 mmol/L 103  103  99   CO2 22 - 32 mmol/L '27  26  24   '$ Calcium 8.9 - 10.3 mg/dL 8.9  9.0  9.4      Radiology Studies: No results found.   Scheduled Meds:  busPIRone  10 mg Oral q morning    cholecalciferol  2,000 Units Oral q morning   clopidogrel  75 mg Oral q morning   darifenacin  7.5 mg Oral Daily   dicyclomine  40 mg Oral TID AC & HS   donepezil  10 mg Oral QHS   enoxaparin (LOVENOX) injection  30 mg Subcutaneous Q24H   glipiZIDE  20 mg Oral BID AC   insulin NPH Human  0-5 Units Subcutaneous QHS   insulin NPH Human  15-20 Units Subcutaneous QAC breakfast   levothyroxine  100 mcg Oral QAC breakfast   linaclotide  290 mcg Oral QAC breakfast   mometasone-formoterol  2 puff Inhalation BID   pantoprazole  40 mg Oral Daily   potassium chloride SA  20 mEq Oral BID   rosuvastatin  20 mg Oral Daily   Continuous Infusions:  sodium chloride 100 mL/hr at 08/14/21 1425     LOS: 1 day   Time spent: Reeder, MD [facility] To contact the attending provider between 7A-7P or the covering provider during after hours 7P-7A, please log into the web site [email or web address] and access using universal [place] password for that web site. If you do not have the password, please call the hospital operator.  08/15/2021, 2:01 PM

## 2021-08-15 NOTE — Evaluation (Signed)
Physical Therapy Evaluation Patient Details Name: Tara Jackson MRN: [id number] DOB: [birth year] Today's Date: 08/15/2021  History of Present Illness  81 y.o. female with medical history significant for anxiety, asthma, coronary artery disease, diabetes mellitus with diabetic neuropathy and retinopathy, diastolic dysfunction, hypertension dyslipidemia, obesity, who presented to the ER with acute onset of generalized weakness and fatigue with associated dizziness, nausea and vomiting, had a fall at home, admitted with gastroenteritis  Clinical Impression      Pt admitted with above diagnosis.  Pt is significantly deconditioned. Limited mobility at home. Would recommend SNF however pt and husband decline, they are agreeable to HHPT. They have all needed DME  Pt currently with functional limitations due to the deficits listed below (see PT Problem List). Pt will benefit from skilled PT to increase their independence and safety with mobility to allow discharge to the venue listed below.      Recommendations for follow up therapy are one component of a multi-disciplinary discharge planning process, led by the attending physician.  Recommendations may be updated based on patient status, additional functional criteria and insurance authorization.  Follow Up Recommendations Home health PT      Assistance Recommended at Discharge Frequent or constant Supervision/Assistance  Patient can return home with the following  A lot of help with bathing/dressing/bathroom;A lot of help with walking and/or transfers;Assistance with cooking/housework;Assist for transportation;Help with stairs or ramp for entrance    Equipment Recommendations None recommended by PT  Recommendations for Other Services       Functional Status Assessment Patient has had a recent decline in their functional status and/or demonstrates limited ability to make significant improvements in function in a reasonable and predictable amount  of time     Precautions / Restrictions Precautions Precautions: Fall Restrictions Weight Bearing Restrictions: No      Mobility  Bed Mobility Overal bed mobility: Needs Assistance Bed Mobility: Rolling, Supine to Sit, Sit to Supine Rolling: Mod assist   Supine to sit: Mod assist Sit to supine: Mod assist   General bed mobility comments: assist to complete roll R and L, assist to elevate trunk and lift LEs on to bed    Transfers Overall transfer level: Needs assistance Equipment used: Rolling walker (2 wheels) Transfers: Sit to/from Stand Sit to Stand: From elevated surface           General transfer comment: x2 with min assist, bed elevated slightly    Ambulation/Gait                  Stairs            Wheelchair Mobility    Modified Rankin (Stroke Patients Only)       Balance Overall balance assessment: Needs assistance, History of Falls Sitting-balance support: Feet supported Sitting balance-Leahy Scale: Fair     Standing balance support: Reliant on assistive device for balance, During functional activity, Bilateral upper extremity supported Standing balance-Leahy Scale: Poor                               Pertinent Vitals/Pain Pain Assessment Pain Assessment: Faces Faces Pain Scale: Hurts little more Pain Location: back Pain Descriptors / Indicators: Aching Pain Intervention(s): Limited activity within patient's tolerance, Monitored during session, Premedicated before session, Repositioned    Home Living Family/patient expects to be discharged to:: Private residence Living Arrangements: Spouse/significant other Available Help at Discharge: Family;Available 24 hours/day Type of Home: [place]  Access: Ramped entrance       Home Layout: One level Home Equipment: Transport planner;Wheelchair - Publishing copy (2 wheels) Additional Comments: uses hover round at home    Prior Function Prior Level of Function :  Needs assist       Physical Assist : Mobility (physical) Mobility (physical): Bed mobility;Transfers;Gait   Mobility Comments: uses hoverround at home, has lift chair, amb very short distances into bathroom with husband's assist       Hand Dominance        Extremity/Trunk Assessment   Upper Extremity Assessment Upper Extremity Assessment: Defer to OT evaluation    Lower Extremity Assessment Lower Extremity Assessment: Generalized weakness       Communication   Communication: No difficulties  Cognition Arousal/Alertness: Awake/alert Behavior During Therapy: WFL for tasks assessed/performed Overall Cognitive Status: Within Functional Limits for tasks assessed                                          General Comments      Exercises     Assessment/Plan    PT Assessment Patient needs continued PT services  PT Problem List Decreased strength;Decreased mobility;Decreased balance;Decreased activity tolerance;Decreased knowledge of use of DME;Pain       PT Treatment Interventions DME instruction;Therapeutic exercise;Gait training;Functional mobility training;Therapeutic activities;Patient/family education    PT Goals (Current goals can be found in the Care Plan section)  Acute Rehab PT Goals Patient Stated Goal: home with HHPT PT Goal Formulation: With patient/family Time For Goal Achievement: 08/29/21 Potential to Achieve Goals: Good    Frequency Min 3X/week     Co-evaluation               AM-PAC PT "6 Clicks" Mobility  Outcome Measure Help needed turning from your back to your side while in a flat bed without using bedrails?: A Lot Help needed moving from lying on your back to sitting on the side of a flat bed without using bedrails?: A Lot Help needed moving to and from a bed to a chair (including a wheelchair)?: Total Help needed standing up from a chair using your arms (e.g., wheelchair or bedside chair)?: A Lot Help needed to  walk in hospital room?: Total Help needed climbing 3-5 steps with a railing? : Total 6 Click Score: 9    End of Session Equipment Utilized During Treatment: Gait belt Activity Tolerance: Patient tolerated treatment well Patient left: in bed;with call bell/phone within reach;with bed alarm set;with family/visitor present   PT Visit Diagnosis: Other abnormalities of gait and mobility (R26.89);Difficulty in walking, not elsewhere classified (R26.2);Muscle weakness (generalized) (M62.81);History of falling (Z91.81)    Time: 1245-8099 PT Time Calculation (min) (ACUTE ONLY): 40 min   Charges:   PT Evaluation $PT Eval Low Complexity: 1 Low PT Treatments $Therapeutic Activity: 23-37 mins        Baxter Flattery, PT  Acute Rehab Dept [facility]) [phone]  [facility] Weekend Pager [facility] only)  [phone]  08/15/2021   [facility] 08/15/2021, 4:03 PM

## 2021-08-16 ENCOUNTER — Encounter (HOSPITAL_COMMUNITY): Payer: Self-pay | Admitting: Family Medicine

## 2021-08-16 DIAGNOSIS — N189 Chronic kidney disease, unspecified: Secondary | ICD-10-CM | POA: Diagnosis not present

## 2021-08-16 DIAGNOSIS — N179 Acute kidney failure, unspecified: Secondary | ICD-10-CM | POA: Diagnosis not present

## 2021-08-16 LAB — BASIC METABOLIC PANEL
Anion gap: 7 (ref 5–15)
BUN: 20 mg/dL (ref 8–23)
CO2: 23 mmol/L (ref 22–32)
Calcium: 8.4 mg/dL — ABNORMAL LOW (ref 8.9–10.3)
Chloride: 109 mmol/L (ref 98–111)
Creatinine, Ser: 1.85 mg/dL — ABNORMAL HIGH (ref 0.44–1.00)
GFR, Estimated: 27 mL/min — ABNORMAL LOW (ref >60)
Glucose, Bld: 206 mg/dL — ABNORMAL HIGH (ref 70–99)
Potassium: 4.3 mmol/L (ref 3.5–5.1)
Sodium: 139 mmol/L (ref 135–145)

## 2021-08-16 LAB — GLUCOSE, CAPILLARY
Glucose-Capillary: 165 mg/dL — ABNORMAL HIGH (ref 70–99)
Glucose-Capillary: 227 mg/dL — ABNORMAL HIGH (ref 70–99)

## 2021-08-16 MED ORDER — GLIPIZIDE 10 MG PO TABS: 20.0000 mg | ORAL_TABLET | Freq: Two times a day (BID) | ORAL | 1 refills | Status: DC

## 2021-08-16 NOTE — TOC Transition Note (Signed)
Transition of Care [facility]) - CM/SW Discharge Note   Patient Details  Name: Tara Jackson MRN: [id number] Date of Birth: 1940/11/28  Transition of Care [facility]) CM/SW Contact:  Lennart Pall, LCSW Phone Number: 08/16/2021, 12:42 PM   Clinical Narrative:     Met with pt to review orders for HHPT follow up.  Pt is agreeable and no agency preference.  Referral placed with [facility].  No further TOC needs.  Final next level of care: Home w Home Health Services Barriers to Discharge: No Barriers Identified   Patient Goals and CMS Choice Patient states their goals for this hospitalization and ongoing recovery are:: return home      Discharge Placement                       Discharge Plan and Services                DME Arranged: N/A DME Agency: NA       HH Arranged: PT [place] Agency: [place] (now "[place]") Date [place]: 08/16/21 Time [place]: [place] Representative spoke with at [place]: [place] ([place]) Interventions     Readmission Risk Interventions    08/16/2021   12:41 PM  Readmission Risk Prevention Plan  Transportation Screening Complete  PCP or Specialist Appt within 5-7 Days Complete  Home Care Screening Complete  Medication Review (RN CM) Complete

## 2021-08-16 NOTE — Progress Notes (Signed)
Patient discharged to home w/ family. Given all belongings, instructions. Patient and family verbalized understanding of all instructions. Escorted to pov via w/c.

## 2021-08-16 NOTE — Discharge Summary (Signed)
Physician Discharge Summary  Tara Jackson INO:[id number] DOB: 04-12-1940 DOA: 08/13/2021  PCP: Shon Baton, MD  Admit date: 08/13/2021 Discharge date: 08/16/2021  Time spent: 36 minutes  Recommendations for Outpatient Follow-up:  Recommend discontinuation of various meds this admission.  Might have precipitated AKI Needs recheck Chem-12 Chem-7 in 1 week Recommend home health PT OT on discharge to help with mobilization Stopped Pamelor, Lasix, tramadol this admission-careful use in the outpatient setting if  planning to restart  Discharge Diagnoses:  MAIN problem for hospitalization   AKI on admission  Please see below for itemized issues addressed in [place]- refer to other progress notes for clarity if needed  Discharge Condition: Improved  Diet recommendation: Heart healthy  Filed Weights   08/13/21 2219 08/15/21 0811  Weight: 103 kg 104.6 kg    History of present illness:  81 year old black female known sick sinus syndrome + 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 HFpEF-nonobstructive CAD on cath 2014 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-on Plavix Spinal stenosis 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 versus significant B12 deficiency DM TY 2+ CKD 3B, hypothyroid, hypertension   Presented to ER with generalized weakness fatigue for several weeks complicated by fall 0/9-GGE some mild diarrhea as well no melena occasional vomit Labs showed BUN/creatinine 34/2.4 compared to prior 20/1.5   Patient hydrated with saline felt to have acute gastroenteritis and was admitted  Hospital Course:  Gastroenteritis prior to admission Symptoms seem to have resolved she is tolerating a full diet, Allow diet and is tolerating fine without any nausea vomiting or other issues AKI on admission secondary to gastroenteritis, Creatinine peaked at 2.4 Was placed on IV saline and BUN/creatinine responded well down to close to baseline 20/1.8 We have held Lasix from prior to admission 20 and she will need to stay off of this for  the time being-this can be reimplemented in the outpatient setting Metabolic encephalopathy during hospital stay Received several medications (Pamelor, tramadol as well as Xanax) causing sleepiness Those medications stopped-mentation has improved diabetes mellitus type 2 CBG 160-200 and better control monitor trends in the outpatient setting Continue glipizide 20 twice daily, NPH 15-20 before breakfast and NPH 5 units at bedtime May have this dosing probably because she might develop hypoglycemia-careful adjustment in the next day or so Prior CVA Continue Plavix 75 every morning Recurrent DVT not on anticoagulation As above Nonobstructive CAD, HFpEF Lasix held-on no other meds Depression Continue BuSpar 10 AM   Discharge Exam: Vitals:   08/16/21 0526 08/16/21 0945  BP: (!) 147/54 (!) 147/63  Pulse: 70 64  Resp: 17 18  Temp: 97.9 F (36.6 C)   SpO2: 98% 99%    Subj on day of d/c   is well sitting up in the bed  No chest pain no fever no chills no nausea no vomiting She has pacemaker in place CTA B no added sound rales rhonchi wheeze Power is 5/5 no focal deficit neurologically intact Abdomen is soft no rebound no guarding S1-S2 no murmur   Discharge Instructions   Discharge Instructions     Diet - low sodium heart healthy   Complete by: As directed    Increase activity slowly   Complete by: As directed       Allergies as of 08/16/2021       Reactions   Iodine Other (See Comments)   ONLY IV--Makes unconscious   Iohexol Other (See Comments)    Code: SOB, Desc: cardiac arrest w/ iv contrast, has never used 13 hr prep//alice calhoun, Onset Date: [id number]  Metoclopramide Hcl Other (See Comments)   suicidal   Sulfa Antibiotics Hives   Lactose Intolerance (gi) Diarrhea   Lansoprazole Nausea And Vomiting        Medication List     STOP taking these medications    clonazePAM 0.5 MG tablet Commonly known as: KLONOPIN   furosemide 20 MG  tablet Commonly known as: LASIX   nortriptyline 50 MG capsule Commonly known as: PAMELOR   pantoprazole 40 MG tablet Commonly known as: PROTONIX   traMADol 50 MG tablet Commonly known as: ULTRAM       TAKE these medications    acetaminophen 325 MG tablet Commonly known as: Tylenol Take 2 tablets (650 mg total) by mouth every 6 (six) hours as needed for up to 30 doses for mild pain or moderate pain.   albuterol 108 (90 Base) MCG/ACT inhaler Commonly known as: VENTOLIN HFA INHALE 2 INHALATIONS 3 TIMES A DAY AS NEEDED FOR COUGH/WHEEZE What changed: See the new instructions.   busPIRone 10 MG tablet Commonly known as: BUSPAR Take 1 tablet (10 mg total) by mouth every morning.   clopidogrel 75 MG tablet Commonly known as: PLAVIX Take 75 mg by mouth every morning.   dicyclomine 20 MG tablet Commonly known as: BENTYL Take 40 mg by mouth in the morning and at bedtime. for cramping   donepezil 10 MG tablet Commonly known as: ARICEPT TAKE 1 TABLET BY MOUTH EVERYDAY AT BEDTIME What changed: See the new instructions.   esomeprazole 40 MG capsule Commonly known as: NEXIUM Take 40 mg by mouth daily.   glipiZIDE 10 MG tablet Commonly known as: GLUCOTROL Take 2 tablets (20 mg total) by mouth 2 (two) times daily before a meal. What changed:  how much to take when to take this   guaiFENesin 600 MG 12 hr tablet Commonly known as: MUCINEX Take 600 mg by mouth 2 (two) times daily as needed for cough.   insulin NPH Human 100 UNIT/ML injection Commonly known as: NOVOLIN N Inject 5-20 Units into the skin See admin instructions. In the morning:  If cbg >300, then give 20 units. If cbg < or = 300, then give 15 unit At night: If cbg >250, then give 5 units. If cbg < or = 250, then give no insulin   insulin regular 100 units/mL injection Commonly known as: NOVOLIN R Inject 0-30 Units into the skin See admin instructions. At breakfast: if cbg < 300, then give 25 units. If cbg > or  = 300, then give 30 units. After dinner: goal cbgs is 150 and give insulin dose to reach this. For every 100 cbg over 150, then give 10 units of insulin.   levothyroxine 100 MCG tablet Commonly known as: SYNTHROID Take 100 mcg by mouth daily before breakfast.   lidocaine 5 % Commonly known as: LIDODERM Place 2 patches onto the skin daily as needed (pain). Remove & Discard patch within 12 hours or as directed by MD   linaclotide 290 MCG Caps capsule Commonly known as: LINZESS Take 290 mcg by mouth daily as needed (for constipation).   Muscle Rub 10-15 % Crea Apply 1 application topically as needed for muscle pain.   OXYGEN Inhale 2 L into the lungs as needed. Used at overnight with a concentrator   potassium chloride SA 20 MEQ tablet Commonly known as: KLOR-CON M Take 20 mEq by mouth 2 (two) times daily. Take 1/2 tablet twice daily.   rosuvastatin 20 MG tablet Commonly known as: CRESTOR Take  20 mg by mouth daily.   solifenacin 5 MG tablet Commonly known as: VESICARE Take 5 mg by mouth daily as needed (to prevent UTI).   Symbicort 80-4.5 MCG/ACT inhaler Generic drug: budesonide-formoterol INHALE 2 PUFFS FIRST THING IN THE MORNING THEN ANOTHER 2 PUFFS ABOUT 12 HOURS LATER   terconazole 0.4 % vaginal cream Commonly known as: TERAZOL 7 Place 1 applicator vaginally daily as needed for itching.   Vitamin D-3 25 MCG (1000 UT) Caps Take 3,000 Units by mouth every morning.       Allergies  Allergen Reactions   Iodine Other (See Comments)    ONLY IV--Makes unconscious   Iohexol Other (See Comments)     Code: SOB, Desc: cardiac arrest w/ iv contrast, has never used 13 hr prep//alice calhoun, Onset Date: 44034742    Metoclopramide Hcl Other (See Comments)    suicidal   Sulfa Antibiotics Hives   Lactose Intolerance (Gi) Diarrhea   Lansoprazole Nausea And Vomiting      The results of significant diagnostics from this hospitalization (including imaging, microbiology,  ancillary and laboratory) are listed below for reference.    Significant Diagnostic Studies: OCT, Retina - OU - Both Eyes  Result Date: 07/24/2021 Right Eye Quality was good. Central Foveal Thickness: 207. Progression has been stable. Findings include normal foveal contour, no IRF, no SRF, retinal drusen , epiretinal membrane, pigment epithelial detachment, outer retinal atrophy, vitreomacular adhesion (stable). Left Eye Quality was good. Central Foveal Thickness: 217. Progression has been stable. Findings include normal foveal contour, no IRF, no SRF, retinal drusen , pigment epithelial detachment, vitreomacular adhesion (Mild improvement in nasal PED, No DME). 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 CME - cystoid macular edema. PED - pigment epithelial detachment. IRF - intraretinal fluid. SRF - subretinal fluid. EZ - ellipsoid zone. ERM - epiretinal membrane. ORA - outer retinal atrophy. ORT - outer retinal tubulation. SRHM - subretinal hyper-reflective material    Microbiology: No results found for this or any previous visit (from the past 240 hour(s)).   Labs: Basic Metabolic Panel: Recent Labs  Lab 08/13/21 2230 08/14/21 0504 08/15/21 0448 08/16/21 0329  NA 138 138 138 139  K 3.6 3.2* 3.8 4.3  CL 99 103 103 109  CO2 '24 26 27 23  '$ GLUCOSE 271* 197* 288* 206*  BUN 34* 31* 28* 20  CREATININE 2.40* 2.33* 2.01* 1.85*  CALCIUM 9.4 9.0 8.9 8.4*   Liver Function Tests: No results for input(s): "AST", "ALT", "ALKPHOS", "BILITOT", "PROT", "ALBUMIN" in the last 168 hours. No results for input(s): "LIPASE", "AMYLASE" in the last 168 hours. No results for input(s): "AMMONIA" in the last 168 hours. CBC: Recent Labs  Lab 08/13/21 2230 08/14/21 0504 08/15/21 0448  WBC  8.5 8.9 7.1  HGB 13.6 12.2 11.5*  HCT 42.4 36.8 36.6  MCV 96.6 95.6 98.4  PLT 251 224 221   Cardiac Enzymes: No results for input(s): "CKTOTAL", "CKMB", "CKMBINDEX", "TROPONINI" in the last 168 hours. BNP: BNP (last 3 results) No results for input(s): "BNP" in the last 8760 hours.  ProBNP (last 3 results) No results for input(s): "PROBNP" in the last 8760 hours.  CBG: Recent Labs  Lab 08/15/21 1144 08/15/21 1757 08/15/21 2018 08/15/21 2355 08/16/21 0738  GLUCAP 276* 237* 213* 216* 165*  Signed:  Nita Sells MD   [facility] 08/16/2021, 9:58 AM

## 2021-09-07 ENCOUNTER — Ambulatory Visit: Payer: Medicare HMO | Admitting: Internal Medicine

## 2021-09-16 ENCOUNTER — Ambulatory Visit: Payer: Medicare HMO | Admitting: Neurology

## 2021-09-16 ENCOUNTER — Ambulatory Visit (INDEPENDENT_AMBULATORY_CARE_PROVIDER_SITE_OTHER): Payer: Medicare HMO

## 2021-09-16 DIAGNOSIS — I495 Sick sinus syndrome: Secondary | ICD-10-CM | POA: Diagnosis not present

## 2021-09-16 LAB — CUP PACEART REMOTE DEVICE CHECK
Battery Remaining Longevity: 90 mo
Battery Remaining Percentage: 86 %
Brady Statistic RA Percent Paced: 87 %
Brady Statistic RV Percent Paced: 88 %
Date Time Interrogation Session: 20230810030200
Implantable Lead Implant Date: 20170214
Implantable Lead Implant Date: 20170214
Implantable Lead Location: 753859
Implantable Lead Location: 753860
Implantable Lead Model: 7740
Implantable Lead Model: 7741
Implantable Lead Serial Number: 649859
Implantable Lead Serial Number: 728741
Implantable Pulse Generator Implant Date: 20170214
Lead Channel Impedance Value: 1066 Ohm
Lead Channel Impedance Value: 723 Ohm
Lead Channel Pacing Threshold Amplitude: 0.4 V
Lead Channel Pacing Threshold Amplitude: 1.6 V
Lead Channel Pacing Threshold Pulse Width: 0.4 ms
Lead Channel Pacing Threshold Pulse Width: 0.4 ms
Lead Channel Setting Pacing Amplitude: 2 V
Lead Channel Setting Pacing Amplitude: 2.2 V
Lead Channel Setting Pacing Pulse Width: 0.4 ms
Lead Channel Setting Sensing Sensitivity: 2.5 mV
Pulse Gen Serial Number: 718098

## 2021-09-28 ENCOUNTER — Other Ambulatory Visit (HOSPITAL_COMMUNITY)
Admission: RE | Admit: 2021-09-28 | Discharge: 2021-09-28 | Disposition: A | Discharge status: Home / Self Care | Payer: Medicare HMO | Source: Other Acute Inpatient Hospital | Attending: Internal Medicine | Admitting: Internal Medicine

## 2021-09-28 DIAGNOSIS — Z79899 Other long term (current) drug therapy: Secondary | ICD-10-CM | POA: Insufficient documentation

## 2021-09-28 DIAGNOSIS — I10 Essential (primary) hypertension: Secondary | ICD-10-CM | POA: Insufficient documentation

## 2021-09-28 LAB — CBC WITH DIFFERENTIAL/PLATELET
Abs Immature Granulocytes: 0.03 10*3/uL (ref 0.00–0.07)
Basophils Absolute: 0.1 10*3/uL (ref 0.0–0.1)
Basophils Relative: 1 %
Eosinophils Absolute: 0 10*3/uL (ref 0.0–0.5)
Eosinophils Relative: 0 %
HCT: 37.4 % (ref 36.0–46.0)
Hemoglobin: 11.9 g/dL — ABNORMAL LOW (ref 12.0–15.0)
Immature Granulocytes: 0 %
Lymphocytes Relative: 24 %
Lymphs Abs: 2 10*3/uL (ref 0.7–4.0)
MCH: 31.1 pg (ref 26.0–34.0)
MCHC: 31.8 g/dL (ref 30.0–36.0)
MCV: 97.7 fL (ref 80.0–100.0)
Monocytes Absolute: 0.4 10*3/uL (ref 0.1–1.0)
Monocytes Relative: 5 %
Neutro Abs: 5.8 10*3/uL (ref 1.7–7.7)
Neutrophils Relative %: 70 %
Platelets: 225 10*3/uL (ref 150–400)
RBC: 3.83 MIL/uL — ABNORMAL LOW (ref 3.87–5.11)
RDW: 14.1 % (ref 11.5–15.5)
WBC: 8.3 10*3/uL (ref 4.0–10.5)
nRBC: 0 % (ref 0.0–0.2)

## 2021-09-28 LAB — COMPREHENSIVE METABOLIC PANEL
ALT: 15 U/L (ref 0–44)
AST: 20 U/L (ref 15–41)
Albumin: 2.9 g/dL — ABNORMAL LOW (ref 3.5–5.0)
Alkaline Phosphatase: 70 U/L (ref 38–126)
Anion gap: 12 (ref 5–15)
BUN: 20 mg/dL (ref 8–23)
CO2: 25 mmol/L (ref 22–32)
Calcium: 8.7 mg/dL — ABNORMAL LOW (ref 8.9–10.3)
Chloride: 98 mmol/L (ref 98–111)
Creatinine, Ser: 1.64 mg/dL — ABNORMAL HIGH (ref 0.44–1.00)
GFR, Estimated: 31 mL/min — ABNORMAL LOW (ref >60)
Glucose, Bld: 269 mg/dL — ABNORMAL HIGH (ref 70–99)
Potassium: 3.4 mmol/L — ABNORMAL LOW (ref 3.5–5.1)
Sodium: 135 mmol/L (ref 135–145)
Total Bilirubin: 1.3 mg/dL — ABNORMAL HIGH (ref 0.3–1.2)
Total Protein: 6.3 g/dL — ABNORMAL LOW (ref 6.5–8.1)

## 2021-10-04 ENCOUNTER — Other Ambulatory Visit: Payer: Self-pay | Admitting: Neurology

## 2021-10-05 ENCOUNTER — Encounter (HOSPITAL_COMMUNITY): Payer: Self-pay

## 2021-10-05 ENCOUNTER — Other Ambulatory Visit: Payer: Self-pay

## 2021-10-05 ENCOUNTER — Inpatient Hospital Stay (HOSPITAL_COMMUNITY)
Admission: EM | Admit: 2021-10-05 | Discharge: 2021-10-13 | DRG: 392 | Disposition: A | Discharge status: Home Health | Payer: Medicare HMO | Attending: Internal Medicine | Admitting: Internal Medicine

## 2021-10-05 ENCOUNTER — Emergency Department (HOSPITAL_COMMUNITY): Payer: Medicare HMO

## 2021-10-05 DIAGNOSIS — K224 Dyskinesia of esophagus: Secondary | ICD-10-CM | POA: Diagnosis present

## 2021-10-05 DIAGNOSIS — K862 Cyst of pancreas: Secondary | ICD-10-CM

## 2021-10-05 DIAGNOSIS — Z950 Presence of cardiac pacemaker: Secondary | ICD-10-CM | POA: Diagnosis present

## 2021-10-05 DIAGNOSIS — Z882 Allergy status to sulfonamides status: Secondary | ICD-10-CM

## 2021-10-05 DIAGNOSIS — Z96653 Presence of artificial knee joint, bilateral: Secondary | ICD-10-CM | POA: Diagnosis present

## 2021-10-05 DIAGNOSIS — Z79899 Other long term (current) drug therapy: Secondary | ICD-10-CM

## 2021-10-05 DIAGNOSIS — I152 Hypertension secondary to endocrine disorders: Secondary | ICD-10-CM | POA: Diagnosis present

## 2021-10-05 DIAGNOSIS — I495 Sick sinus syndrome: Secondary | ICD-10-CM | POA: Diagnosis present

## 2021-10-05 DIAGNOSIS — I5A Non-ischemic myocardial injury (non-traumatic): Secondary | ICD-10-CM | POA: Diagnosis present

## 2021-10-05 DIAGNOSIS — Z7951 Long term (current) use of inhaled steroids: Secondary | ICD-10-CM

## 2021-10-05 DIAGNOSIS — Z7902 Long term (current) use of antithrombotics/antiplatelets: Secondary | ICD-10-CM

## 2021-10-05 DIAGNOSIS — Z86711 Personal history of pulmonary embolism: Secondary | ICD-10-CM

## 2021-10-05 DIAGNOSIS — Z9981 Dependence on supplemental oxygen: Secondary | ICD-10-CM

## 2021-10-05 DIAGNOSIS — E669 Obesity, unspecified: Secondary | ICD-10-CM | POA: Diagnosis present

## 2021-10-05 DIAGNOSIS — Y848 Other medical procedures as the cause of abnormal reaction of the patient, or of later complication, without mention of misadventure at the time of the procedure: Secondary | ICD-10-CM | POA: Diagnosis present

## 2021-10-05 DIAGNOSIS — Z7401 Bed confinement status: Secondary | ICD-10-CM

## 2021-10-05 DIAGNOSIS — Z86718 Personal history of other venous thrombosis and embolism: Secondary | ICD-10-CM

## 2021-10-05 DIAGNOSIS — R1110 Vomiting, unspecified: Secondary | ICD-10-CM

## 2021-10-05 DIAGNOSIS — G4733 Obstructive sleep apnea (adult) (pediatric): Secondary | ICD-10-CM | POA: Diagnosis present

## 2021-10-05 DIAGNOSIS — R627 Adult failure to thrive: Secondary | ICD-10-CM | POA: Diagnosis present

## 2021-10-05 DIAGNOSIS — E113293 Type 2 diabetes mellitus with mild nonproliferative diabetic retinopathy without macular edema, bilateral: Secondary | ICD-10-CM | POA: Diagnosis present

## 2021-10-05 DIAGNOSIS — J45909 Unspecified asthma, uncomplicated: Secondary | ICD-10-CM | POA: Diagnosis present

## 2021-10-05 DIAGNOSIS — R269 Unspecified abnormalities of gait and mobility: Secondary | ICD-10-CM | POA: Diagnosis present

## 2021-10-05 DIAGNOSIS — N1832 Chronic kidney disease, stage 3b: Secondary | ICD-10-CM | POA: Diagnosis present

## 2021-10-05 DIAGNOSIS — Z79890 Hormone replacement therapy (postmenopausal): Secondary | ICD-10-CM

## 2021-10-05 DIAGNOSIS — D7589 Other specified diseases of blood and blood-forming organs: Secondary | ICD-10-CM | POA: Diagnosis present

## 2021-10-05 DIAGNOSIS — Z993 Dependence on wheelchair: Secondary | ICD-10-CM

## 2021-10-05 DIAGNOSIS — H409 Unspecified glaucoma: Secondary | ICD-10-CM | POA: Diagnosis present

## 2021-10-05 DIAGNOSIS — E1140 Type 2 diabetes mellitus with diabetic neuropathy, unspecified: Secondary | ICD-10-CM | POA: Diagnosis present

## 2021-10-05 DIAGNOSIS — K2940 Chronic atrophic gastritis without bleeding: Principal | ICD-10-CM | POA: Diagnosis present

## 2021-10-05 DIAGNOSIS — E1169 Type 2 diabetes mellitus with other specified complication: Secondary | ICD-10-CM | POA: Diagnosis present

## 2021-10-05 DIAGNOSIS — Z7189 Other specified counseling: Secondary | ICD-10-CM

## 2021-10-05 DIAGNOSIS — H3530 Unspecified macular degeneration: Secondary | ICD-10-CM | POA: Diagnosis present

## 2021-10-05 DIAGNOSIS — Z91041 Radiographic dye allergy status: Secondary | ICD-10-CM

## 2021-10-05 DIAGNOSIS — Z6832 Body mass index (BMI) 32.0-32.9, adult: Secondary | ICD-10-CM

## 2021-10-05 DIAGNOSIS — E039 Hypothyroidism, unspecified: Secondary | ICD-10-CM | POA: Diagnosis present

## 2021-10-05 DIAGNOSIS — K2289 Other specified disease of esophagus: Secondary | ICD-10-CM | POA: Diagnosis present

## 2021-10-05 DIAGNOSIS — F03A3 Unspecified dementia, mild, with mood disturbance: Secondary | ICD-10-CM | POA: Diagnosis present

## 2021-10-05 DIAGNOSIS — I2510 Atherosclerotic heart disease of native coronary artery without angina pectoris: Secondary | ICD-10-CM | POA: Diagnosis present

## 2021-10-05 DIAGNOSIS — I5032 Chronic diastolic (congestive) heart failure: Secondary | ICD-10-CM | POA: Diagnosis present

## 2021-10-05 DIAGNOSIS — K219 Gastro-esophageal reflux disease without esophagitis: Secondary | ICD-10-CM | POA: Diagnosis present

## 2021-10-05 DIAGNOSIS — M810 Age-related osteoporosis without current pathological fracture: Secondary | ICD-10-CM | POA: Diagnosis present

## 2021-10-05 DIAGNOSIS — R778 Other specified abnormalities of plasma proteins: Secondary | ICD-10-CM

## 2021-10-05 DIAGNOSIS — Z9181 History of falling: Secondary | ICD-10-CM

## 2021-10-05 DIAGNOSIS — E1122 Type 2 diabetes mellitus with diabetic chronic kidney disease: Secondary | ICD-10-CM | POA: Diagnosis present

## 2021-10-05 DIAGNOSIS — Z833 Family history of diabetes mellitus: Secondary | ICD-10-CM

## 2021-10-05 DIAGNOSIS — R112 Nausea with vomiting, unspecified: Secondary | ICD-10-CM | POA: Diagnosis present

## 2021-10-05 DIAGNOSIS — R079 Chest pain, unspecified: Secondary | ICD-10-CM | POA: Diagnosis present

## 2021-10-05 DIAGNOSIS — R339 Retention of urine, unspecified: Secondary | ICD-10-CM | POA: Diagnosis present

## 2021-10-05 DIAGNOSIS — Z8673 Personal history of transient ischemic attack (TIA), and cerebral infarction without residual deficits: Secondary | ICD-10-CM

## 2021-10-05 DIAGNOSIS — E785 Hyperlipidemia, unspecified: Secondary | ICD-10-CM | POA: Diagnosis present

## 2021-10-05 DIAGNOSIS — Z20822 Contact with and (suspected) exposure to covid-19: Secondary | ICD-10-CM | POA: Diagnosis present

## 2021-10-05 DIAGNOSIS — Z7984 Long term (current) use of oral hypoglycemic drugs: Secondary | ICD-10-CM

## 2021-10-05 DIAGNOSIS — Z888 Allergy status to other drugs, medicaments and biological substances status: Secondary | ICD-10-CM

## 2021-10-05 DIAGNOSIS — T82119A Breakdown (mechanical) of unspecified cardiac electronic device, initial encounter: Secondary | ICD-10-CM | POA: Diagnosis present

## 2021-10-05 DIAGNOSIS — E1165 Type 2 diabetes mellitus with hyperglycemia: Secondary | ICD-10-CM | POA: Diagnosis present

## 2021-10-05 DIAGNOSIS — F03A4 Unspecified dementia, mild, with anxiety: Secondary | ICD-10-CM | POA: Diagnosis present

## 2021-10-05 DIAGNOSIS — Z794 Long term (current) use of insulin: Secondary | ICD-10-CM

## 2021-10-05 LAB — CBC WITH DIFFERENTIAL/PLATELET
Abs Immature Granulocytes: 0.06 10*3/uL (ref 0.00–0.07)
Basophils Absolute: 0.1 10*3/uL (ref 0.0–0.1)
Basophils Relative: 1 %
Eosinophils Absolute: 0 10*3/uL (ref 0.0–0.5)
Eosinophils Relative: 0 %
HCT: 39.8 % (ref 36.0–46.0)
Hemoglobin: 12.5 g/dL (ref 12.0–15.0)
Immature Granulocytes: 1 %
Lymphocytes Relative: 16 %
Lymphs Abs: 1.7 10*3/uL (ref 0.7–4.0)
MCH: 31.5 pg (ref 26.0–34.0)
MCHC: 31.4 g/dL (ref 30.0–36.0)
MCV: 100.3 fL — ABNORMAL HIGH (ref 80.0–100.0)
Monocytes Absolute: 0.7 10*3/uL (ref 0.1–1.0)
Monocytes Relative: 7 %
Neutro Abs: 7.7 10*3/uL (ref 1.7–7.7)
Neutrophils Relative %: 75 %
Platelets: 254 10*3/uL (ref 150–400)
RBC: 3.97 MIL/uL (ref 3.87–5.11)
RDW: 14.3 % (ref 11.5–15.5)
WBC: 10.2 10*3/uL (ref 4.0–10.5)
nRBC: 0 % (ref 0.0–0.2)

## 2021-10-05 LAB — COMPREHENSIVE METABOLIC PANEL
ALT: 14 U/L (ref 0–44)
AST: 20 U/L (ref 15–41)
Albumin: 3.1 g/dL — ABNORMAL LOW (ref 3.5–5.0)
Alkaline Phosphatase: 69 U/L (ref 38–126)
Anion gap: 11 (ref 5–15)
BUN: 23 mg/dL (ref 8–23)
CO2: 22 mmol/L (ref 22–32)
Calcium: 9.5 mg/dL (ref 8.9–10.3)
Chloride: 104 mmol/L (ref 98–111)
Creatinine, Ser: 1.59 mg/dL — ABNORMAL HIGH (ref 0.44–1.00)
GFR, Estimated: 32 mL/min — ABNORMAL LOW (ref >60)
Glucose, Bld: 189 mg/dL — ABNORMAL HIGH (ref 70–99)
Potassium: 4.2 mmol/L (ref 3.5–5.1)
Sodium: 137 mmol/L (ref 135–145)
Total Bilirubin: 1.2 mg/dL (ref 0.3–1.2)
Total Protein: 6.6 g/dL (ref 6.5–8.1)

## 2021-10-05 LAB — TSH: TSH: 0.676 u[IU]/mL (ref 0.350–4.500)

## 2021-10-05 LAB — TROPONIN I (HIGH SENSITIVITY): Troponin I (High Sensitivity): 31 ng/L — ABNORMAL HIGH (ref <18)

## 2021-10-05 LAB — LIPASE, BLOOD: Lipase: 25 U/L (ref 11–51)

## 2021-10-05 LAB — MAGNESIUM: Magnesium: 1.7 mg/dL (ref 1.7–2.4)

## 2021-10-05 MED ORDER — ONDANSETRON HCL 4 MG/2ML IJ SOLN
4.0000 mg | Freq: Once | INTRAMUSCULAR | Status: AC
  Administered 2021-10-05: 4 mg via INTRAVENOUS
  Filled 2021-10-05: qty 2

## 2021-10-05 MED ORDER — LACTATED RINGERS IV BOLUS
500.0000 mL | Freq: Once | INTRAVENOUS | Status: AC
  Administered 2021-10-05: 500 mL via INTRAVENOUS

## 2021-10-05 MED ORDER — ASPIRIN 81 MG PO CHEW
324.0000 mg | CHEWABLE_TABLET | Freq: Once | ORAL | Status: AC
  Administered 2021-10-06: 324 mg via ORAL
  Filled 2021-10-05: qty 4

## 2021-10-05 NOTE — ED Provider Notes (Signed)
[facility] DEPT Provider Note   CSN: [id number] Arrival date & time: 10/05/21  1830     History  No chief complaint on file.   Tara Jackson is a 81 y.o. female. 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  She has had no appetite and has had significant decreased p.o. intake both food and liquids.  Per her husband she has been gagging and intermittently spitting up fluid but not having any large emesis episodes.  She has had normal bowel movements and no diarrhea, no melena or hematochezia.  She has had some mild upper abdominal pain and discomfort but denies any fevers, dysuria, cough, URI symptoms, or shortness of breath.  She also notes some mild discomfort at her pacemaker site and keeps pointing at her pacemaker but denies any deeper chest pain or pressure-like symptoms or radiation to her back or arms.  She says she feels similar to when she was previously admitted to a month or so prior.  Her husband notes she has had significantly darker brown urine.  Denies any new medicines but notes cutting her Klonopin down.  She notes some generalized fatigue and weakness from all this but no focal weakness, paresthesias, loss of sensation and has been feeling intermittently lightheaded when changing position or trying to go the bathroom but no syncopal episodes.  HPI     Home Medications Prior to Admission medications   Medication Sig Start Date End Date Taking? Authorizing Provider  clonazePAM (KLONOPIN) 0.5 MG tablet Take 1 mg by mouth 2 (two) times daily.   Yes [provider]  acetaminophen (TYLENOL) 325 MG tablet Take 2 tablets (650 mg total) by mouth every 6 (six) hours as needed for up to 30 doses for mild pain or moderate pain. 07/21/20   Wyvonnia Dusky, MD  albuterol (VENTOLIN HFA) 108 (90 Base)  MCG/ACT inhaler INHALE 2 INHALATIONS 3 TIMES A DAY AS NEEDED FOR COUGH/WHEEZE Patient taking differently: Inhale 2 puffs into the lungs 3 (three) times daily as needed for wheezing (cough). 12/23/20   Tanda Rockers, MD  busPIRone (BUSPAR) 10 MG tablet Take 1 tablet (10 mg total) by mouth every morning. 06/03/14   Garvin Fila, MD  Cholecalciferol (VITAMIN D-3) 25 MCG (1000 UT) CAPS Take 3,000 Units by mouth every morning.    [provider]  clopidogrel (PLAVIX) 75 MG tablet Take 75 mg by mouth every morning.    [provider]  dicyclomine (BENTYL) 20 MG tablet Take 40 mg by mouth in the morning and at bedtime. for cramping    [provider]  donepezil (ARICEPT) 10 MG tablet TAKE 1 TABLET BY MOUTH EVERYDAY AT BEDTIME Patient taking differently: Take 10 mg by mouth at bedtime. 10/04/21   Frann Rider, NP  esomeprazole (NEXIUM) 40 MG capsule Take 40 mg by mouth daily. Patient not taking: Reported on 08/14/2021 06/08/20   [provider]  famotidine (PEPCID) 20 MG tablet Take 20 mg by mouth daily. 10/04/21   [provider]  glipiZIDE (GLUCOTROL) 10 MG tablet Take 2 tablets (20 mg total) by mouth 2 (two) times daily before a meal. 08/16/21   Nita Sells, MD  guaiFENesin (MUCINEX) 600 MG 12 hr tablet Take 600 mg by mouth 2 (two) times daily as needed for cough.    [provider]  insulin NPH (HUMULIN N,NOVOLIN N)  100 UNIT/ML injection Inject 5-20 Units into the skin See admin instructions. In the morning:  If cbg >300, then give 20 units. If cbg < or = 300, then give 15 unit At night: If cbg >250, then give 5 units. If cbg < or = 250, then give no insulin    [provider]  insulin regular (NOVOLIN R,HUMULIN R) 100 units/mL injection Inject 0-30 Units into the skin See admin instructions. At breakfast: if cbg < 300, then give 25 units. If cbg > or = 300, then give 30 units. After dinner: goal cbgs is 150 and give insulin dose to  reach this. For every 100 cbg over 150, then give 10 units of insulin.    [provider]  levothyroxine (SYNTHROID) 100 MCG tablet Take 100 mcg by mouth daily before breakfast.    [provider]  lidocaine (LIDODERM) 5 % Place 2 patches onto the skin daily as needed (pain). Remove & Discard patch within 12 hours or as directed by MD    [provider]  linaclotide (LINZESS) 290 MCG CAPS capsule Take 290 mcg by mouth daily as needed (for constipation).    [provider]  Menthol-Methyl Salicylate (MUSCLE RUB) 10-15 % CREA Apply 1 application topically as needed for muscle pain.    [provider]  nortriptyline (PAMELOR) 50 MG capsule Take 50 mg by mouth at bedtime. 09/02/21   [provider]  OXYGEN Inhale 2 L into the lungs as needed. Used at overnight with a Cytogeneticist, Historical, MD  pantoprazole (PROTONIX) 40 MG tablet Take 40 mg by mouth 2 (two) times daily. 10/04/21   [provider]  potassium chloride SA (KLOR-CON M) 20 MEQ tablet Take 20 mEq by mouth 2 (two) times daily. Take 1/2 tablet twice daily.    [provider]  rosuvastatin (CRESTOR) 20 MG tablet Take 20 mg by mouth daily.  03/13/17   [provider]  solifenacin (VESICARE) 5 MG tablet Take 5 mg by mouth daily as needed (to prevent UTI). 02/24/19   [provider]  SYMBICORT 80-4.5 MCG/ACT inhaler INHALE 2 PUFFS FIRST THING IN THE MORNING THEN ANOTHER 2 PUFFS ABOUT 12 HOURS LATER Patient not taking: Reported on 08/14/2021 10/27/20   Tanda Rockers, MD  terconazole (TERAZOL 7) 0.4 % vaginal cream Place 1 applicator vaginally daily as needed for itching. 02/12/19   [provider]      Allergies    Iodine, Iohexol, Metoclopramide hcl, Sulfa antibiotics, Lactose intolerance (gi), and Lansoprazole    Review of Systems   Review of Systems  Physical Exam Updated Vital Signs BP 123/82   Pulse (!) 112   Temp (!) 97.5 F (36.4  C) (Oral)   Resp 15   SpO2 100%  Physical Exam Constitutional: Alert and oriented.  Fatigued but nontoxic, NAD Eyes: Conjunctivae are normal. ENT      Head: Normocephalic and atraumatic.      Nose: No congestion.      Mouth/Throat: Mucous membranes are dry.      Neck: No stridor. Cardiovascular: S1, S2, tachycardic, regular rhythm, extremities are warm and dry, reported tenderness to pacemaker site when palpating Respiratory: Normal respiratory effort. Breath sounds are normal.  O2 sat 100 on RA Gastrointestinal: Mild lower abdominal tenderness, no rebound or guarding Musculoskeletal: Normal range of motion in all extremities. No edema of the lower extremities. Neurologic: Normal speech and language.  Moves all extremities equally.  No facial droop.  Sensation grossly intact.  No gross focal neurologic deficits are appreciated. Skin: Skin is warm, dry and intact. No rash noted. Psychiatric: Mood and affect are normal. Speech and behavior are normal.  ED Results / Procedures / Treatments   Labs (all labs ordered are listed, but only abnormal results are displayed) Labs Reviewed  CBC WITH DIFFERENTIAL/PLATELET - Abnormal; Notable for the following components:      Result Value   MCV 100.3 (*)    All other components within normal limits  COMPREHENSIVE METABOLIC PANEL  LIPASE, BLOOD  MAGNESIUM  TSH  T4, FREE  URINALYSIS, ROUTINE W REFLEX MICROSCOPIC  TROPONIN I (HIGH SENSITIVITY)  TROPONIN I (HIGH SENSITIVITY)    EKG EKG Interpretation  Date/Time:  Tuesday October 05 2021 19:50:31 EDT Ventricular Rate:  92 PR Interval:  265 QRS Duration: 90 QT Interval:  385 QTC Calculation: 477 R Axis:   -19 Text Interpretation: Sinus rhythm Prolonged PR interval Probable left atrial enlargement Borderline left axis deviation Low voltage, precordial leads Borderline repolarization abnormality Minimal ST elevation, inferior leads First-degree block PR interval 265.  Inferior lead  interpretation limited by motion artifact Confirmed by Georgina Snell [phone]) on 10/05/2021 8:48:41 PM  Radiology CT ABDOMEN PELVIS WO CONTRAST  Result Date: 10/05/2021 CLINICAL DATA:  Nausea and vomiting. Constipation. Generalized weakness. EXAM: CT ABDOMEN AND PELVIS WITHOUT CONTRAST TECHNIQUE: Multidetector CT imaging of the abdomen and pelvis was performed following the standard protocol without IV contrast. RADIATION DOSE REDUCTION: This exam was performed according to the departmental dose-optimization program which includes automated exposure control, adjustment of the mA and/or kV according to patient size and/or use of iterative reconstruction technique. COMPARISON:  08/05/2013. FINDINGS: Lower chest: The heart is normal in size and pacemaker leads are noted in the right heart. Mild atelectasis or scarring is present at the lung bases. Hepatobiliary: No focal liver abnormality is seen. Status post cholecystectomy. No biliary dilatation. Pancreas: Pancreas is atrophic. There is a hypodensity in the tail of the pancreas with cystic attenuation measuring 1.2 cm. No pancreatic ductal dilatation. Spleen: Normal in size without focal abnormality. Adrenals/Urinary Tract: Adrenal glands are unremarkable. Kidneys are normal, without renal calculi, focal lesion, or hydronephrosis. Bladder is unremarkable. Stomach/Bowel: Small hiatal hernia is present. Stomach is otherwise within normal limits. Appendix is not seen. No evidence of acute bowel wall thickening, distention, or inflammatory changes. No free air or pneumatosis. A moderate amount of retained stool is present in the colon. Vascular/Lymphatic: Aortic atherosclerosis. No enlarged abdominal or pelvic lymph nodes. Reproductive: Status post hysterectomy. No adnexal masses. Other: No abdominopelvic ascites. Pelvic floor laxity is noted and unchanged from the previous exam. Musculoskeletal: Degenerative changes in the thoracolumbar spine. No acute osseous  abnormality. IMPRESSION: 1. No acute intra-abdominal process. 2. Moderate amount of retained stool in the colon, compatible with history of constipation. 3. Small hiatal hernia. 4. 1.2 cm cystic structure in the tail of the pancreas. Two year follow-up is recommended for surveillance. 5. Aortic atherosclerosis. Electronically Signed   By: Brett Fairy M.D.   On: 10/05/2021 20:48    Procedures Procedures  Remain on constant cardiac monitoring, sinus tachycardia.  Medications Ordered in ED Medications  lactated ringers bolus 500 mL (0 mLs Intravenous Stopped 10/05/21 2230)  ondansetron (ZOFRAN) injection 4 mg (4 mg Intravenous Given 10/05/21 2122)  lactated ringers bolus 500 mL (500 mLs Intravenous New Bag/Given 10/05/21 2230)    ED Course/ Medical Decision Making/ A&P  Medical Decision Making Garnett Rekowski is a 81 y.o. female. 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    Patient describes overall weakness or fatigue, rather than any particular focal weakness.  EKG sinus rhythm with prolonged PR interval and nonspecific ST changes that were limited by motion artifact.  Regarding patient's generalized weakness, differential is broad and includes but is not limited to acute electrolyte disturbances (such as hypokalemia, hypoglycemia, hypoMg, hypoCa), thyroid disturbance, infection, arrhythmia, atypical ACS, anemia, dehydration among multiple other etiologies. Intact neurologic exam and chronic nature of symptoms make CNS etiology such as stroke, tumor unlikely.    Based on her history and story though my major concern for AKI and acute electrolyte abnormalities in the setting of decreased p.o. intake and dry mucous membranes and overall clinically dehydrated exam.  Cardiac etiologies such as arrhythmia or atypical ACS will  be evaluated with EKG and troponin; anemia will be ruled out with CBC; infection will be evaluated with basic infectious workup (CTAP without contrast, urinalysis); acute electrolyte abnormalities ruled out with serum studies.  IVF for dehydration.    Plan to obtain CBC, Chem 10, TSH, EKG, troponin, UA, CTAP without contrast. Will give patient IVF. Disposition pending workup and reassessment.   Discussed with patient that if the above workup is negative, emergent conditions have been ruled out and patient can follow up as an outpatient.   Amount and/or Complexity of Data Reviewed Labs: ordered. Decision-making details documented in ED Course. Radiology: ordered and independent interpretation performed. Decision-making details documented in ED Course. ECG/medicine tests: independent interpretation performed. Decision-making details documented in ED Course.  Risk Prescription drug management.      Final Clinical Impression(s) / ED Diagnoses Final diagnoses:  Nausea and vomiting, unspecified vomiting type  Pancreatic cyst    Rx / DC Orders ED Discharge Orders     None

## 2021-10-05 NOTE — ED Triage Notes (Signed)
Per EMS, patient from home, generalized weakness x3 weeks. Reports N/V and constipation since last week. A&Ox4.  BP 154/78 HR 80 RR 20 CBG 170

## 2021-10-05 NOTE — ED Provider Notes (Incomplete)
[facility] DEPT Provider Note   CSN: [id number] Arrival date & time: 10/05/21  1830     History  No chief complaint on file.   Tara Jackson is a 81 y.o. female. 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  She has had no appetite and has had significant decreased p.o. intake both food and liquids.  Per her husband she has been gagging and intermittently spitting up fluid but not having any large emesis episodes.  She has had normal bowel movements and no diarrhea, no melena or hematochezia.  She has had some mild upper abdominal pain and discomfort but denies any fevers, dysuria, cough, URI symptoms, or shortness of breath.  She also notes some mild discomfort at her pacemaker site and keeps pointing at her pacemaker but denies any deeper chest pain or pressure-like symptoms or radiation to her back or arms.  She says she feels similar to when she was previously admitted to a month or so prior.  Her husband notes she has had significantly darker brown urine.  Denies any new medicines but notes cutting her Klonopin down.  She notes some generalized fatigue and weakness from all this but no focal weakness, paresthesias, loss of sensation and has been feeling intermittently lightheaded when changing position or trying to go the bathroom but no syncopal episodes.  HPI     Home Medications Prior to Admission medications   Medication Sig Start Date End Date Taking? Authorizing Provider  acetaminophen (TYLENOL) 325 MG tablet Take 2 tablets (650 mg total) by mouth every 6 (six) hours as needed for up to 30 doses for mild pain or moderate pain. 07/21/20  Yes Trifan, Carola Rhine, MD  albuterol (VENTOLIN HFA) 108 (90 Base) MCG/ACT inhaler INHALE 2 INHALATIONS 3 TIMES A DAY AS NEEDED FOR COUGH/WHEEZE Patient taking differently:  Inhale 2 puffs into the lungs 3 (three) times daily as needed for wheezing (cough). 12/23/20  Yes Tanda Rockers, MD  busPIRone (BUSPAR) 10 MG tablet Take 1 tablet (10 mg total) by mouth every morning. 06/03/14  Yes Garvin Fila, MD  Cholecalciferol (VITAMIN D-3) 25 MCG (1000 UT) CAPS Take 3,000 Units by mouth every morning.   Yes [provider]  clonazePAM (KLONOPIN) 0.5 MG tablet Take 1 mg by mouth 2 (two) times daily.   Yes [provider]  clopidogrel (PLAVIX) 75 MG tablet Take 75 mg by mouth every morning.   Yes [provider]  dicyclomine (BENTYL) 20 MG tablet Take 40 mg by mouth in the morning and at bedtime. for cramping   Yes [provider]  donepezil (ARICEPT) 10 MG tablet TAKE 1 TABLET BY MOUTH EVERYDAY AT BEDTIME Patient taking differently: Take 10 mg by mouth at bedtime. 10/04/21  Yes McCue, Janett Billow, NP  famotidine (PEPCID) 20 MG tablet Take 20 mg by mouth daily. 10/04/21  Yes [provider]  glipiZIDE (GLUCOTROL) 10 MG tablet Take 2 tablets (20 mg total) by mouth 2 (two) times daily before a meal. 08/16/21  Yes Nita Sells, MD  guaiFENesin (MUCINEX) 600 MG 12 hr tablet Take 600 mg by mouth 2 (two) times daily as needed for cough.   Yes [provider]  insulin NPH (HUMULIN N,NOVOLIN N) 100 UNIT/ML injection Inject 5-20 Units into the skin See admin instructions. In the morning:  If cbg >300, then give 20 units. If  cbg < or = 300, then give 15 unit At night: If cbg >250, then give 5 units. If cbg < or = 250, then give no insulin   Yes [provider]  insulin regular (NOVOLIN R,HUMULIN R) 100 units/mL injection Inject 0-30 Units into the skin See admin instructions. At breakfast: if cbg < 300, then give 25 units. If cbg > or = 300, then give 30 units. After dinner: goal cbgs is 150 and give insulin dose to reach this. For every 100 cbg over 150, then give 10 units of insulin.   Yes [provider]   levothyroxine (SYNTHROID) 100 MCG tablet Take 100 mcg by mouth daily before breakfast.   Yes [provider]  lidocaine (LIDODERM) 5 % Place 2 patches onto the skin daily as needed (pain). Remove & Discard patch within 12 hours or as directed by MD   Yes [provider]  linaclotide (LINZESS) 290 MCG CAPS capsule Take 290 mcg by mouth daily as needed (for constipation).   Yes [provider]  Menthol-Methyl Salicylate (MUSCLE RUB) 10-15 % CREA Apply 1 application topically as needed for muscle pain.   Yes [provider]  nortriptyline (PAMELOR) 50 MG capsule Take 50 mg by mouth at bedtime. 09/02/21  Yes [provider]  pantoprazole (PROTONIX) 40 MG tablet Take 40 mg by mouth 2 (two) times daily. 10/04/21  Yes [provider]  potassium chloride SA (KLOR-CON M) 20 MEQ tablet Take 20 mEq by mouth 2 (two) times daily. Take 1/2 tablet twice daily.   Yes [provider]  rosuvastatin (CRESTOR) 20 MG tablet Take 20 mg by mouth daily.  03/13/17  Yes [provider]  solifenacin (VESICARE) 5 MG tablet Take 5 mg by mouth daily as needed (to prevent UTI). 02/24/19  Yes [provider]  terconazole (TERAZOL 7) 0.4 % vaginal cream Place 1 applicator vaginally daily as needed for itching. 02/12/19  Yes [provider]  esomeprazole (NEXIUM) 40 MG capsule Take 40 mg by mouth daily. Patient not taking: Reported on 08/14/2021 06/08/20   [provider]  OXYGEN Inhale 2 L into the lungs as needed. Used at overnight with a Cytogeneticist, Historical, MD  SYMBICORT 80-4.5 MCG/ACT inhaler INHALE 2 PUFFS FIRST THING IN THE MORNING THEN ANOTHER 2 PUFFS ABOUT 12 HOURS LATER Patient not taking: Reported on 08/14/2021 10/27/20   Tanda Rockers, MD      Allergies    Iodine, Iohexol, Metoclopramide hcl, Sulfa antibiotics, Lactose intolerance (gi), and Lansoprazole    Review of Systems   Review of Systems  Physical  Exam Updated Vital Signs BP 110/67   Pulse (!) 109   Temp 97.7 F (36.5 C)   Resp 15   SpO2 99%  Physical Exam Constitutional: Alert and oriented.  Fatigued but nontoxic, NAD Eyes: Conjunctivae are normal. ENT      Head: Normocephalic and atraumatic.      Nose: No congestion.      Mouth/Throat: Mucous membranes are dry.      Neck: No stridor. Cardiovascular: S1, S2, tachycardic, regular rhythm, extremities are warm and dry, reported tenderness to pacemaker site when palpating Respiratory: Normal respiratory effort. Breath sounds are normal.  O2 sat 100 on RA Gastrointestinal: Mild lower abdominal tenderness, no rebound or guarding Musculoskeletal: Normal range of motion in all extremities. No edema of the lower extremities. Neurologic: Normal speech and language.  Moves all extremities equally.  No facial droop.  Sensation  grossly intact.  No gross focal neurologic deficits are appreciated. Skin: Skin is warm, dry and intact. No rash noted. Psychiatric: Mood and affect are normal. Speech and behavior are normal.  ED Results / Procedures / Treatments   Labs (all labs ordered are listed, but only abnormal results are displayed) Labs Reviewed  CBC WITH DIFFERENTIAL/PLATELET - Abnormal; Notable for the following components:      Result Value   MCV 100.3 (*)    All other components within normal limits  COMPREHENSIVE METABOLIC PANEL - Abnormal; Notable for the following components:   Glucose, Bld 189 (*)    Creatinine, Ser 1.59 (*)    Albumin 3.1 (*)    GFR, Estimated 32 (*)    All other components within normal limits  TROPONIN I (HIGH SENSITIVITY) - Abnormal; Notable for the following components:   Troponin I (High Sensitivity) 31 (*)    All other components within normal limits  LIPASE, BLOOD  MAGNESIUM  TSH  T4, FREE  URINALYSIS, ROUTINE W REFLEX MICROSCOPIC  TROPONIN I (HIGH SENSITIVITY)    EKG EKG Interpretation  Date/Time:  Tuesday October 05 2021 23:37:27  EDT Ventricular Rate:  87 PR Interval:  265 QRS Duration: 173 QT Interval:  474 QTC Calculation: 571 R Axis:   -83 Text Interpretation: Atrial fibrillation Multi interpolated vent premature complexes Nonspecific IVCD with LAD Left ventricular hypertrophy VENTRICULAR PACED RHYTHM Confirmed by Georgina Snell [phone]) on 10/05/2021 11:42:58 PM  Radiology CT ABDOMEN PELVIS WO CONTRAST  Result Date: 10/05/2021 CLINICAL DATA:  Nausea and vomiting. Constipation. Generalized weakness. EXAM: CT ABDOMEN AND PELVIS WITHOUT CONTRAST TECHNIQUE: Multidetector CT imaging of the abdomen and pelvis was performed following the standard protocol without IV contrast. RADIATION DOSE REDUCTION: This exam was performed according to the departmental dose-optimization program which includes automated exposure control, adjustment of the mA and/or kV according to patient size and/or use of iterative reconstruction technique. COMPARISON:  08/05/2013. FINDINGS: Lower chest: The heart is normal in size and pacemaker leads are noted in the right heart. Mild atelectasis or scarring is present at the lung bases. Hepatobiliary: No focal liver abnormality is seen. Status post cholecystectomy. No biliary dilatation. Pancreas: Pancreas is atrophic. There is a hypodensity in the tail of the pancreas with cystic attenuation measuring 1.2 cm. No pancreatic ductal dilatation. Spleen: Normal in size without focal abnormality. Adrenals/Urinary Tract: Adrenal glands are unremarkable. Kidneys are normal, without renal calculi, focal lesion, or hydronephrosis. Bladder is unremarkable. Stomach/Bowel: Small hiatal hernia is present. Stomach is otherwise within normal limits. Appendix is not seen. No evidence of acute bowel wall thickening, distention, or inflammatory changes. No free air or pneumatosis. A moderate amount of retained stool is present in the colon. Vascular/Lymphatic: Aortic atherosclerosis. No enlarged abdominal or pelvic lymph nodes.  Reproductive: Status post hysterectomy. No adnexal masses. Other: No abdominopelvic ascites. Pelvic floor laxity is noted and unchanged from the previous exam. Musculoskeletal: Degenerative changes in the thoracolumbar spine. No acute osseous abnormality. IMPRESSION: 1. No acute intra-abdominal process. 2. Moderate amount of retained stool in the colon, compatible with history of constipation. 3. Small hiatal hernia. 4. 1.2 cm cystic structure in the tail of the pancreas. Two year follow-up is recommended for surveillance. 5. Aortic atherosclerosis. Electronically Signed   By: Brett Fairy M.D.   On: 10/05/2021 20:48    Procedures Procedures  Remain on constant cardiac monitoring, sinus tachycardia.  Medications Ordered in ED Medications  aspirin chewable tablet 324 mg (has no administration in time range)  lactated ringers bolus 500 mL (0 mLs Intravenous Stopped 10/05/21 2230)  ondansetron (ZOFRAN) injection 4 mg (4 mg Intravenous Given 10/05/21 2122)  lactated ringers bolus 500 mL (0 mLs Intravenous Stopped 10/05/21 2349)    ED Course/ Medical Decision Making/ A&P Clinical Course as of 10/05/21 2350  Tue Oct 05, 2021  2348 Patient's repeat EKG obtained now ventricularly paced rhythm.  Interrogation of pacemaker showed no evidence of V. tach episode and only showed evidence of 6967 PACs and 3291 PVCs between December 01, 2020 and today.  Her troponin was elevated at 31 which is unusual for patient suspect demand ischemia in the setting of intermittent pacing but also consider atypical ACS.  I have ordered for aspirin.  Her pain is atypical as it is reproducible over the pacemaker.  Her creatinine was 1.59 and stable.  UA is pending.  Normal magnesium and potassium 4.2.  No anemia hemoglobin 12.5.  Paged hospitalist for admission due to concern for fatigue and atypical chest pain with elevated troponin. [VB]    Clinical Course User Index [VB] Elgie Congo, MD                            Medical Decision Making Zelina Jimerson Fenster is a 81 y.o. female. 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    Patient describes overall weakness or fatigue, rather than any particular focal weakness.  EKG sinus rhythm with prolonged PR interval and nonspecific ST changes that were limited by motion artifact.  Regarding patient's generalized weakness, differential is broad and includes but is not limited to acute electrolyte disturbances (such as hypokalemia, hypoglycemia, hypoMg, hypoCa), thyroid disturbance, infection, arrhythmia, atypical ACS, anemia, dehydration among multiple other etiologies. Intact neurologic exam and chronic nature of symptoms make CNS etiology such as stroke, tumor unlikely.    Based on her history and story though my major concern for AKI and acute electrolyte abnormalities in the setting of decreased p.o. intake and dry mucous membranes and overall clinically dehydrated exam.  Cardiac etiologies such as arrhythmia or atypical ACS will be evaluated with EKG and troponin; anemia will be ruled out with CBC; infection will be evaluated with basic infectious workup (CTAP without contrast, urinalysis); acute electrolyte abnormalities ruled out with serum studies.  IVF for dehydration.    Plan to obtain CBC, Chem 10, TSH, EKG, troponin, UA, CTAP without contrast. Will give patient IVF. Disposition pending workup and reassessment.   Discussed with patient that if the above workup is negative, emergent conditions have been ruled out and patient can follow up as an outpatient.   Amount and/or Complexity of Data Reviewed Labs: ordered. Decision-making details documented in ED Course. Radiology: ordered and independent interpretation performed. Decision-making details documented in ED Course. ECG/medicine tests: independent interpretation  performed. Decision-making details documented in ED Course.  Risk OTC drugs. Prescription drug management.      Final Clinical Impression(s) / ED Diagnoses Final diagnoses:  Nausea and vomiting, unspecified vomiting type  Pancreatic cyst  Elevated troponin    Rx / DC Orders ED Discharge Orders     None

## 2021-10-05 NOTE — ED Notes (Signed)
Bladder scan preformed prior to attempting In and Out cath.  Scanned bladder x 3.  No urine resulted with multiple scans

## 2021-10-05 NOTE — ED Notes (Signed)
Patient has IT consultant.  Pacemaker interrogated at this time.  Data successfully sent

## 2021-10-05 NOTE — ED Provider Triage Note (Signed)
Emergency Medicine Provider Triage Evaluation Note  Natayla Cadenhead , a 81 y.o. female  was evaluated in triage.  Pt complains of 3 weeks of worsening weakness, nausea, vomiting.  Patient also seemed somewhat confused at this time.  Family is not with patient to confirm baseline mental status.  Patient is convinced that she knows me and I have not seen or treated the patient in the past.  Patient denies abdominal pain.  Endorses nausea, vomiting, with generalized weakness, chest discomfort.  Review of Systems  Positive: As above Negative: As above  Physical Exam  BP (!) 126/103   Pulse 85   Temp (!) 97.5 F (36.4 C) (Oral)   Resp 16   SpO2 100%  Gen:   Awake, no distress   Resp:  Normal effort  MSK:   Moves extremities without difficulty  Other:    Medical Decision Making  Medically screening exam initiated at 7:11 PM.  Appropriate orders placed.  Aashika Carta Hinojos was informed that the remainder of the evaluation will be completed by another provider, this initial triage assessment does not replace that evaluation, and the importance of remaining in the ED until their evaluation is complete.     Dorothyann Peng, [place] 10/05/21 1912

## 2021-10-05 NOTE — ED Notes (Signed)
Unable to obtain labs with IV start.  Will attempt again shortly

## 2021-10-06 ENCOUNTER — Observation Stay (HOSPITAL_COMMUNITY): Payer: Medicare HMO

## 2021-10-06 DIAGNOSIS — I499 Cardiac arrhythmia, unspecified: Secondary | ICD-10-CM | POA: Diagnosis not present

## 2021-10-06 DIAGNOSIS — K2940 Chronic atrophic gastritis without bleeding: Secondary | ICD-10-CM | POA: Diagnosis present

## 2021-10-06 DIAGNOSIS — F03A3 Unspecified dementia, mild, with mood disturbance: Secondary | ICD-10-CM | POA: Diagnosis present

## 2021-10-06 DIAGNOSIS — I5A Non-ischemic myocardial injury (non-traumatic): Secondary | ICD-10-CM | POA: Diagnosis present

## 2021-10-06 DIAGNOSIS — Z6832 Body mass index (BMI) 32.0-32.9, adult: Secondary | ICD-10-CM | POA: Diagnosis not present

## 2021-10-06 DIAGNOSIS — E1165 Type 2 diabetes mellitus with hyperglycemia: Secondary | ICD-10-CM | POA: Diagnosis present

## 2021-10-06 DIAGNOSIS — F03A4 Unspecified dementia, mild, with anxiety: Secondary | ICD-10-CM | POA: Diagnosis present

## 2021-10-06 DIAGNOSIS — Y848 Other medical procedures as the cause of abnormal reaction of the patient, or of later complication, without mention of misadventure at the time of the procedure: Secondary | ICD-10-CM | POA: Diagnosis present

## 2021-10-06 DIAGNOSIS — E1169 Type 2 diabetes mellitus with other specified complication: Secondary | ICD-10-CM | POA: Diagnosis not present

## 2021-10-06 DIAGNOSIS — I152 Hypertension secondary to endocrine disorders: Secondary | ICD-10-CM | POA: Diagnosis present

## 2021-10-06 DIAGNOSIS — Z20822 Contact with and (suspected) exposure to covid-19: Secondary | ICD-10-CM | POA: Diagnosis present

## 2021-10-06 DIAGNOSIS — E039 Hypothyroidism, unspecified: Secondary | ICD-10-CM | POA: Diagnosis present

## 2021-10-06 DIAGNOSIS — R1110 Vomiting, unspecified: Secondary | ICD-10-CM

## 2021-10-06 DIAGNOSIS — Z7401 Bed confinement status: Secondary | ICD-10-CM | POA: Diagnosis not present

## 2021-10-06 DIAGNOSIS — E113293 Type 2 diabetes mellitus with mild nonproliferative diabetic retinopathy without macular edema, bilateral: Secondary | ICD-10-CM | POA: Diagnosis present

## 2021-10-06 DIAGNOSIS — E1140 Type 2 diabetes mellitus with diabetic neuropathy, unspecified: Secondary | ICD-10-CM | POA: Diagnosis present

## 2021-10-06 DIAGNOSIS — E119 Type 2 diabetes mellitus without complications: Secondary | ICD-10-CM | POA: Diagnosis not present

## 2021-10-06 DIAGNOSIS — R1310 Dysphagia, unspecified: Secondary | ICD-10-CM | POA: Diagnosis not present

## 2021-10-06 DIAGNOSIS — I5032 Chronic diastolic (congestive) heart failure: Secondary | ICD-10-CM | POA: Diagnosis present

## 2021-10-06 DIAGNOSIS — J449 Chronic obstructive pulmonary disease, unspecified: Secondary | ICD-10-CM | POA: Diagnosis not present

## 2021-10-06 DIAGNOSIS — R079 Chest pain, unspecified: Secondary | ICD-10-CM | POA: Diagnosis not present

## 2021-10-06 DIAGNOSIS — Z950 Presence of cardiac pacemaker: Secondary | ICD-10-CM | POA: Diagnosis not present

## 2021-10-06 DIAGNOSIS — I10 Essential (primary) hypertension: Secondary | ICD-10-CM | POA: Diagnosis not present

## 2021-10-06 DIAGNOSIS — Z888 Allergy status to other drugs, medicaments and biological substances status: Secondary | ICD-10-CM | POA: Diagnosis not present

## 2021-10-06 DIAGNOSIS — D7589 Other specified diseases of blood and blood-forming organs: Secondary | ICD-10-CM | POA: Diagnosis present

## 2021-10-06 DIAGNOSIS — R112 Nausea with vomiting, unspecified: Secondary | ICD-10-CM | POA: Diagnosis not present

## 2021-10-06 DIAGNOSIS — R0789 Other chest pain: Secondary | ICD-10-CM | POA: Diagnosis not present

## 2021-10-06 DIAGNOSIS — T82119A Breakdown (mechanical) of unspecified cardiac electronic device, initial encounter: Secondary | ICD-10-CM | POA: Diagnosis present

## 2021-10-06 DIAGNOSIS — I495 Sick sinus syndrome: Secondary | ICD-10-CM | POA: Diagnosis present

## 2021-10-06 DIAGNOSIS — E669 Obesity, unspecified: Secondary | ICD-10-CM | POA: Diagnosis not present

## 2021-10-06 DIAGNOSIS — Z794 Long term (current) use of insulin: Secondary | ICD-10-CM | POA: Diagnosis not present

## 2021-10-06 DIAGNOSIS — R072 Precordial pain: Secondary | ICD-10-CM

## 2021-10-06 DIAGNOSIS — R000 Tachycardia, unspecified: Secondary | ICD-10-CM | POA: Diagnosis not present

## 2021-10-06 DIAGNOSIS — G4733 Obstructive sleep apnea (adult) (pediatric): Secondary | ICD-10-CM | POA: Diagnosis present

## 2021-10-06 DIAGNOSIS — R9431 Abnormal electrocardiogram [ECG] [EKG]: Secondary | ICD-10-CM | POA: Diagnosis not present

## 2021-10-06 DIAGNOSIS — N1832 Chronic kidney disease, stage 3b: Secondary | ICD-10-CM | POA: Diagnosis present

## 2021-10-06 DIAGNOSIS — J45909 Unspecified asthma, uncomplicated: Secondary | ICD-10-CM | POA: Diagnosis present

## 2021-10-06 DIAGNOSIS — E1122 Type 2 diabetes mellitus with diabetic chronic kidney disease: Secondary | ICD-10-CM | POA: Diagnosis present

## 2021-10-06 DIAGNOSIS — K862 Cyst of pancreas: Secondary | ICD-10-CM | POA: Diagnosis present

## 2021-10-06 DIAGNOSIS — R627 Adult failure to thrive: Secondary | ICD-10-CM | POA: Diagnosis present

## 2021-10-06 DIAGNOSIS — R6889 Other general symptoms and signs: Secondary | ICD-10-CM | POA: Diagnosis not present

## 2021-10-06 DIAGNOSIS — Z993 Dependence on wheelchair: Secondary | ICD-10-CM | POA: Diagnosis not present

## 2021-10-06 LAB — BASIC METABOLIC PANEL
Anion gap: 14 (ref 5–15)
BUN: 22 mg/dL (ref 8–23)
CO2: 21 mmol/L — ABNORMAL LOW (ref 22–32)
Calcium: 9.8 mg/dL (ref 8.9–10.3)
Chloride: 104 mmol/L (ref 98–111)
Creatinine, Ser: 1.54 mg/dL — ABNORMAL HIGH (ref 0.44–1.00)
GFR, Estimated: 33 mL/min — ABNORMAL LOW (ref >60)
Glucose, Bld: 181 mg/dL — ABNORMAL HIGH (ref 70–99)
Potassium: 4.3 mmol/L (ref 3.5–5.1)
Sodium: 139 mmol/L (ref 135–145)

## 2021-10-06 LAB — HEMOGLOBIN A1C
Hgb A1c MFr Bld: 7.8 % — ABNORMAL HIGH (ref 4.8–5.6)
Mean Plasma Glucose: 177.16 mg/dL

## 2021-10-06 LAB — MAGNESIUM: Magnesium: 1.9 mg/dL (ref 1.7–2.4)

## 2021-10-06 LAB — TROPONIN I (HIGH SENSITIVITY)
Troponin I (High Sensitivity): 33 ng/L — ABNORMAL HIGH (ref <18)
Troponin I (High Sensitivity): 39 ng/L — ABNORMAL HIGH (ref <18)
Troponin I (High Sensitivity): 33 ng/L — ABNORMAL HIGH (ref <18)

## 2021-10-06 LAB — CBC
HCT: 41.9 % (ref 36.0–46.0)
Hemoglobin: 12.9 g/dL (ref 12.0–15.0)
MCH: 31.2 pg (ref 26.0–34.0)
MCHC: 30.8 g/dL (ref 30.0–36.0)
MCV: 101.2 fL — ABNORMAL HIGH (ref 80.0–100.0)
Platelets: 259 10*3/uL (ref 150–400)
RBC: 4.14 MIL/uL (ref 3.87–5.11)
RDW: 14.4 % (ref 11.5–15.5)
WBC: 8.5 10*3/uL (ref 4.0–10.5)
nRBC: 0 % (ref 0.0–0.2)

## 2021-10-06 LAB — URINALYSIS, ROUTINE W REFLEX MICROSCOPIC
Bacteria, UA: NONE SEEN
Bilirubin Urine: NEGATIVE
Glucose, UA: NEGATIVE mg/dL
Ketones, ur: NEGATIVE mg/dL
Leukocytes,Ua: NEGATIVE
Nitrite: NEGATIVE
Protein, ur: NEGATIVE mg/dL
Specific Gravity, Urine: 1.011 (ref 1.005–1.030)
pH: 5 (ref 5.0–8.0)

## 2021-10-06 LAB — GLUCOSE, CAPILLARY
Glucose-Capillary: 193 mg/dL — ABNORMAL HIGH (ref 70–99)
Glucose-Capillary: 185 mg/dL — ABNORMAL HIGH (ref 70–99)
Glucose-Capillary: 152 mg/dL — ABNORMAL HIGH (ref 70–99)

## 2021-10-06 LAB — T4, FREE: Free T4: 1.51 ng/dL — ABNORMAL HIGH (ref 0.61–1.12)

## 2021-10-06 LAB — CBG MONITORING, ED
Glucose-Capillary: 177 mg/dL — ABNORMAL HIGH (ref 70–99)
Glucose-Capillary: 163 mg/dL — ABNORMAL HIGH (ref 70–99)
Glucose-Capillary: 141 mg/dL — ABNORMAL HIGH (ref 70–99)

## 2021-10-06 LAB — PHOSPHORUS: Phosphorus: 3.1 mg/dL (ref 2.5–4.6)

## 2021-10-06 MED ORDER — ORAL CARE MOUTH RINSE: 15.0000 mL | OROMUCOSAL | Status: DC | PRN

## 2021-10-06 MED ORDER — ROSUVASTATIN CALCIUM 20 MG PO TABS
20.0000 mg | ORAL_TABLET | Freq: Every day | ORAL | Status: DC
  Administered 2021-10-06 – 2021-10-13 (×8): 20 mg via ORAL
  Filled 2021-10-06 (×8): qty 1

## 2021-10-06 MED ORDER — CLOPIDOGREL BISULFATE 75 MG PO TABS
75.0000 mg | ORAL_TABLET | Freq: Every morning | ORAL | Status: DC
  Administered 2021-10-06 – 2021-10-07 (×2): 75 mg via ORAL
  Filled 2021-10-06 (×3): qty 1

## 2021-10-06 MED ORDER — INSULIN ASPART 100 UNIT/ML IJ SOLN
0.0000 [IU] | INTRAMUSCULAR | Status: DC
  Administered 2021-10-06 (×3): 2 [IU] via SUBCUTANEOUS
  Administered 2021-10-07: 1 [IU] via SUBCUTANEOUS
  Administered 2021-10-07 (×2): 2 [IU] via SUBCUTANEOUS
  Administered 2021-10-07: 5 [IU] via SUBCUTANEOUS
  Administered 2021-10-08: 2 [IU] via SUBCUTANEOUS
  Administered 2021-10-08: 1 [IU] via SUBCUTANEOUS
  Filled 2021-10-06: qty 0.09

## 2021-10-06 MED ORDER — NORTRIPTYLINE HCL 25 MG PO CAPS
50.0000 mg | ORAL_CAPSULE | Freq: Every day | ORAL | Status: DC
Start: 2021-10-06 — End: 2021-10-06
  Filled 2021-10-06: qty 2

## 2021-10-06 MED ORDER — CLONAZEPAM 0.125 MG PO TBDP
0.2500 mg | ORAL_TABLET | Freq: Two times a day (BID) | ORAL | Status: DC
  Administered 2021-10-06 – 2021-10-13 (×15): 0.25 mg via ORAL
  Filled 2021-10-06 (×15): qty 2

## 2021-10-06 MED ORDER — DICLOFENAC SODIUM 1 % EX GEL
2.0000 g | Freq: Three times a day (TID) | CUTANEOUS | Status: DC
Start: 2021-10-06 — End: 2021-10-13
  Administered 2021-10-06 – 2021-10-13 (×21): 2 g via TOPICAL
  Filled 2021-10-06: qty 100

## 2021-10-06 MED ORDER — BUSPIRONE HCL 10 MG PO TABS
10.0000 mg | ORAL_TABLET | Freq: Every morning | ORAL | Status: DC
  Administered 2021-10-06 – 2021-10-13 (×8): 10 mg via ORAL
  Filled 2021-10-06 (×8): qty 1

## 2021-10-06 MED ORDER — ACETAMINOPHEN 325 MG PO TABS
650.0000 mg | ORAL_TABLET | Freq: Four times a day (QID) | ORAL | Status: DC | PRN
  Administered 2021-10-06 – 2021-10-11 (×6): 650 mg via ORAL
  Filled 2021-10-06 (×7): qty 2

## 2021-10-06 MED ORDER — SODIUM CHLORIDE 0.9 % IV BOLUS
250.0000 mL | Freq: Once | INTRAVENOUS | Status: AC
  Administered 2021-10-06: 250 mL via INTRAVENOUS

## 2021-10-06 MED ORDER — LEVOTHYROXINE SODIUM 100 MCG PO TABS
100.0000 ug | ORAL_TABLET | Freq: Every day | ORAL | Status: DC
  Administered 2021-10-06 – 2021-10-13 (×8): 100 ug via ORAL
  Filled 2021-10-06 (×8): qty 1

## 2021-10-06 MED ORDER — SODIUM CHLORIDE 0.9 % IV SOLN: INTRAVENOUS | Status: AC

## 2021-10-06 MED ORDER — MELATONIN 5 MG PO TABS
5.0000 mg | ORAL_TABLET | Freq: Every evening | ORAL | Status: DC | PRN
  Administered 2021-10-08 – 2021-10-11 (×3): 5 mg via ORAL
  Filled 2021-10-06 (×3): qty 1

## 2021-10-06 MED ORDER — FAMOTIDINE 20 MG PO TABS
20.0000 mg | ORAL_TABLET | Freq: Every day | ORAL | Status: DC
  Administered 2021-10-06 – 2021-10-13 (×8): 20 mg via ORAL
  Filled 2021-10-06 (×8): qty 1

## 2021-10-06 MED ORDER — DONEPEZIL HCL 10 MG PO TABS
10.0000 mg | ORAL_TABLET | Freq: Every day | ORAL | Status: DC
  Administered 2021-10-06 – 2021-10-12 (×7): 10 mg via ORAL
  Filled 2021-10-06 (×7): qty 1

## 2021-10-06 MED ORDER — PROCHLORPERAZINE EDISYLATE 10 MG/2ML IJ SOLN
10.0000 mg | Freq: Four times a day (QID) | INTRAMUSCULAR | Status: DC | PRN
  Administered 2021-10-06: 10 mg via INTRAVENOUS
  Filled 2021-10-06: qty 2

## 2021-10-06 MED ORDER — ENOXAPARIN SODIUM 40 MG/0.4ML IJ SOSY
40.0000 mg | PREFILLED_SYRINGE | INTRAMUSCULAR | Status: DC
  Administered 2021-10-06 – 2021-10-13 (×8): 40 mg via SUBCUTANEOUS
  Filled 2021-10-06 (×8): qty 0.4

## 2021-10-06 MED ORDER — ALUM & MAG HYDROXIDE-SIMETH 200-200-20 MG/5ML PO SUSP
30.0000 mL | Freq: Once | ORAL | Status: AC
  Administered 2021-10-06: 30 mL via ORAL
  Filled 2021-10-06: qty 30

## 2021-10-06 MED ORDER — MOMETASONE FURO-FORMOTEROL FUM 100-5 MCG/ACT IN AERO
2.0000 | INHALATION_SPRAY | Freq: Two times a day (BID) | RESPIRATORY_TRACT | Status: DC
  Filled 2021-10-06 (×2): qty 8.8

## 2021-10-06 NOTE — Progress Notes (Signed)
Echocardiogram not completed per patient request. Per patient " Do not touch me. I don't want the test, I am aware of what's wrong with my heart".  Darlina Sicilian RDCS

## 2021-10-06 NOTE — Progress Notes (Signed)
PT Cancellation Note  Patient Details Name: Tara Jackson MRN: [id number] DOB: [birth year]   Cancelled Treatment:    Reason Eval/Treat Not Completed:  Attempted PT eval-pt would briefly open eyes but then immediately fall back asleep. Unable to get pt to wake up enough to meaningfully participate. Will check back another day.  [facility]  Office: [phone] Pager: [phone]      ,

## 2021-10-06 NOTE — ED Notes (Signed)
Patient asking for her husband. Explained that he would be here this morning when he woke up. Patient seems to forget she has just pressed call button and asked and repeats this every few minutes.

## 2021-10-06 NOTE — ED Notes (Signed)
Pt not allowing tech to take Temp.

## 2021-10-06 NOTE — Progress Notes (Signed)
Pt and spouse refused cpap. Both of them said they never used anything at home. Only use O2 PRN. RN aware.

## 2021-10-06 NOTE — ED Notes (Signed)
Patient readjusted in bed and provided with new blankets.

## 2021-10-06 NOTE — ED Provider Notes (Signed)
Care assumed from Dr. Nechama Guard, patient with nausea, vague chest pain, elevated troponin pending consultation with hospitalist for admission.  Case is discussed with Dr. Nevada Crane of [facility], who agrees to admit the patient.   Delora Fuel, MD 70/62/37 [phone]

## 2021-10-06 NOTE — Progress Notes (Signed)
[facility] Progress Note  Patient: Tara Jackson     ZOX:[id number]  DOA: 10/05/2021   PCP: Shon Baton, MD       Brief hospital course:   This this is an 81 year old female with dementia, who is bedbound/wheelchair-bound for little over a month, essential hypertension, diabetes mellitus type 2, CKD stage IIIb, chronic diastolic heart failure, morbid obesity, sick sinus syndrome status post pacemaker who presents to the hospital for chest pain.  Troponin were low normal. On my eval after discussion with the patient's husband it seems that the patient has had persistent problems since she was last discharged from here in July.  Currently she was admitted at that time for possible acute gastroenteritis and was on full liquids when she was discharged back to home.  According to the patient's husband, she is not able to tolerate food for few weeks now.  He states that she develops vomiting as soon as she tries to sit up.  She has been tolerating some liquids but not much and has not tolerated solid food at all.  She is sleeping all the time and has become increasingly weak.  He notes that she has had weight loss as well but cannot really comment of fibromyalgia. Due to the patient's severe fatigue, the patient's husband states that some of her medications were discontinued after a video visit with her primary care physician on 8/18.  These are as follows: He esomeprazole, guaifenesin, Vesicare and Symbicort.  He also states that the patient hardly ever takes her nortriptyline.   Subjective:  The patient persistently complains of left-sided pain and points to her left upper chest.  She is a poor historian otherwise.  Assessment and Plan: Principal Problem:   Chest pain -This appears to be musculoskeletal-exam and is in the left upper chest-Voltaren gel has been ordered  Active Problems:    Vomiting -Primary symptom seems to be inability to tolerate food with nearly persistent nausea  and intermittent vomiting - Her persistent sleepiness is likely secondary to poor oral intake - GI consult has been requested and an upper GI series has been ordered along with H. pylori serology    Chronic kidney disease, stage 3b (HCC) -GFR is in in the low 30s    Diabetes mellitus type 2 in obese (HCC) Currently on a low-dose sliding scale -Hold Glucotrol, NPH and Novolin  Dementia - Continue Aricept  Anxiety - Continue clonazepam and BuSpar    Hypothyroidism, unspecified -Synthroid    Cardiac pacemaker in situ  Obstructive sleep apnea - She is supposed to use oxygen at night   DVT prophylaxis:  enoxaparin (LOVENOX) injection 40 mg Start: 10/06/21 1200     Code Status: Full Code  Consultants: GI Level of Care: Level of care: Telemetry Disposition Plan:  Status is: Observation The patient will require care spanning > 2 midnights and should be moved to inpatient because: Poor oral intake, requires IV fluids  Objective:   Vitals:   10/06/21 0842 10/06/21 0944 10/06/21 1027 10/06/21 1648  BP: 122/66 130/66  106/68  Pulse: 86 84  98  Resp: '20 16  16  '$ Temp: 98.2 F (36.8 C) 98.3 F (36.8 C)  98.4 F (36.9 C)  TempSrc:  Oral  Oral  SpO2: 98% 100%  94%  Weight:   96 kg   Height:   5' 7.5" (1.715 m)    Filed Weights   10/06/21 1027  Weight: 96 kg   Exam: General exam: Appears comfortable  HEENT: PERRLA, oral mucosa moist, no sclera icterus or thrush Respiratory system: Clear to auscultation. Respiratory effort normal. Cardiovascular system: S1 & S2 heard, regular rate and rhythm Chest: Tenderness noted in left upper chest wall Gastrointestinal system: Abdomen soft, non-tender, nondistended. Normal bowel sounds   Central nervous system: Alert and oriented. No focal neurological deficits. Extremities: No cyanosis, clubbing or edema Skin: No rashes or ulcers Psychiatry: Flat affect  Imaging and lab data was personally reviewed    CBC: Recent Labs  Lab  10/05/21 2054 10/06/21 0358  WBC 10.2 8.5  NEUTROABS 7.7  --   HGB 12.5 12.9  HCT 39.8 41.9  MCV 100.3* 101.2*  PLT 254 196   Basic Metabolic Panel: Recent Labs  Lab 10/05/21 2054 10/06/21 0358  NA 137 139  K 4.2 4.3  CL 104 104  CO2 22 21*  GLUCOSE 189* 181*  BUN 23 22  CREATININE 1.59* 1.54*  CALCIUM 9.5 9.8  MG 1.7 1.9  PHOS  --  3.1   GFR: Estimated Creatinine Clearance: 33.3 mL/min (A) (by C-G formula based on SCr of 1.54 mg/dL (H)).  Scheduled Meds:  busPIRone  10 mg Oral q morning   clonazepam  0.25 mg Oral BID   clopidogrel  75 mg Oral q morning   diclofenac Sodium  2 g Topical TID   donepezil  10 mg Oral QHS   enoxaparin (LOVENOX) injection  40 mg Subcutaneous Q24H   famotidine  20 mg Oral Daily   insulin aspart  0-9 Units Subcutaneous Q4H   levothyroxine  100 mcg Oral Q0600   mometasone-formoterol  2 puff Inhalation BID   nortriptyline  50 mg Oral QHS   rosuvastatin  20 mg Oral Daily   Continuous Infusions:  sodium chloride 50 mL/hr at 10/06/21 0354     LOS: 0 days   Author: Eunice Blase Evie Crumpler  10/06/2021 5:24 PM

## 2021-10-06 NOTE — ED Notes (Signed)
RN to bedside to obtain morning labs and medicate patient.  Patient found with all monitoring equipment on floor and IV had been removed.  All blankets where on floor.  Patient resting with eyes closed.  Patient awake easy to voice.  States she does not recall removing any of the equipment.

## 2021-10-06 NOTE — ED Notes (Addendum)
Clicked off Echocardiogram by mistake.

## 2021-10-06 NOTE — H&P (Addendum)
History and Physical  Tara Jackson OXB:[id number] DOB: Jun 21, 1940 DOA: 10/05/2021  Referring physician: Dr. Roxanne Mins, EDP  PCP: Shon Baton, MD  Outpatient Specialists: Cardiology, neurology. Patient coming from: Home.  Chief Complaint: Intermittent chest pain  HPI: Tara Jackson is a 81 y.o. female with medical history significant for mild cognitive impairment, sick sinus syndrome status post pacemaker placement, ambulatory dysfunction and previous falls, essential hypertension, type 2 diabetes, hyperlipidemia, CKD 3B, OSA, chronic diastolic CHF, morbid obesity, who presented to [facility] ED from home due to intermittent chest pain.  Centrally located nonradiating and nonexertional.  Associated with nausea and no vomiting.  Received full dose aspirin in the ED.  Denies abdominal pain.  No diarrhea.  The patient is a poor historian in the setting of mild Cognitive deficit.  At the time of this visit her chest pain had resolved.  Initial high-sensitivity troponin 31.  Repeat troponin is pending.  TRH, hospitalist service, was asked to admit.  ED Course: Tmax 97.5.  BP 106/72, pulse 83, respiration rate 16, saturation 100% on room air.  Lab studies remarkable for troponin 31.  Serum glucose 199.  BUN 23, creatinine 1.59.  Review of Systems: Review of systems as noted in the HPI. All other systems reviewed and are negative.   Past Medical History:  Diagnosis Date   Anemia    Anxiety    Arthritis    Asthma    Brittle bone disease    CKD (chronic kidney disease)    Complication of anesthesia    hard to wake up after anesthesia, trouble turning head   Coronary artery disease, non-occlusive 2014   a. minimal CAD by cath in 2014 with 40% RI stenosis. b. low-risk NST in 11/2014.   Depression    Diabetes mellitus type 2, insulin dependent (HCC)    Diabetic neuropathy (HCC)    Diabetic retinopathy ([place])    NPDR OU   Diastolic dysfunction, left ventricle 11/30/14   EF 55%, grade 1 DD   DVT (deep  venous thrombosis) ([place])    "18 in RLE; 7 LLE prior to PE" (03/24/2015)   Family history of adverse reaction to anesthesia    " the whole family is hard to wake up"   Glaucoma    H/O hiatal hernia    Hyperlipidemia    Hypertension associated with diabetes ([place])    Hypertensive retinopathy    OU   Hypothyroidism    Kidney stones    "passed them"   Macular degeneration    Exu OU   Memory loss 06/03/2014   On home oxygen therapy    "2L oxygen concentrator @ night"   Osteoporosis    Oxygen desaturation during sleep    wears 2 liters of oxygen at night    Palpitations 35years ago   Cardionet monitor - revealed mostly normal sinus rhythm, sinus bradycardia with first-degree A-V block heart rates mostly in the 50s and 60s with some 70s. No arrhythmias, PVCs or PACs noted.   PE (pulmonary thromboembolism) ([place]) x 3, last one was ~ 2003   history of recurrent RLE DVT with PE - last PE ~>13 yrs; Maintained on Plavix   Pneumonia    Presence of permanent cardiac pacemaker    Restless legs syndrome    Sleep apnea    cpap disontinued; test done 07/14/2008 ordered per  Byrum   Spinal headache    Spinal stenosis    L 3, L 4 and L 5, and C 1, C  2 and C3   Stroke ([place]) 04-19-2013   tia and stroke. 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 s/p [employer] PPM in 03/2015 by Dr. Lovena Le secondary to sinus node dysfunction.    TIA (transient ischemic attack) March 2014   Past Surgical History:  Procedure Laterality Date   ABDOMINAL HYSTERECTOMY     APPENDECTOMY     BACK SURGERY     steroid inj   CATARACT EXTRACTION Bilateral    CATARACT EXTRACTION W/ INTRAOCULAR LENS  IMPLANT, BILATERAL Bilateral 2000s   CHOLECYSTECTOMY     EP IMPLANTABLE DEVICE N/A 03/24/2015   [facility] [employer] PPM, Dr. Lovena Le   ESOPHAGOGASTRODUODENOSCOPY  08/09/2011   Procedure: ESOPHAGOGASTRODUODENOSCOPY (EGD);  Surgeon: Jeryl Columbia, MD;  Location: [facility];  Service: Endoscopy;  Laterality: N/A;    ESOPHAGOGASTRODUODENOSCOPY (EGD) WITH PROPOFOL N/A 11/12/2013   Procedure: ESOPHAGOGASTRODUODENOSCOPY (EGD) WITH PROPOFOL;  Surgeon: Jeryl Columbia, MD;  Location: [facility] ENDOSCOPY;  Service: Endoscopy;  Laterality: N/A;   ESOPHAGOGASTRODUODENOSCOPY (EGD) WITH PROPOFOL N/A 05/05/2016   Procedure: ESOPHAGOGASTRODUODENOSCOPY (EGD) WITH PROPOFOL;  Surgeon: Clarene Essex, MD;  Location[facility] END[facility]COPY;  Service: Endoscopy;  Laterality: N/A;   EYE SURGERY Bilateral    Cat Sx OU   GLAUCOMA SURGERY Bilateral 2000s   "laser"   HOT HEMOSTASIS  08/09/2011   Procedure: HOT HEMOSTASIS (ARGON PLASMA COAGULATION/BICAP);  Surgeon: Jeryl Columbia, MD;  Location: [facility];  Service: Endoscopy;  Laterality: N/A;   HOT HEMOSTASIS N/A 11/12/2013   Procedure: HOT HEMOSTASIS (ARGON PLASMA COAGULATION/BICAP);  Surgeon: Jeryl Columbia, MD;  Location: [facility]OS[facility];  Service: Endoscopy;  Laterality: N/A;   INSERT / REPLACE / REMOVE PACEMAKER  03/24/2015   IRIDOTOMY / IRIDECTOMY Bilateral    JOINT REPLACEMENT     KNEE SURGERY Left done with 2nd left knee replacement   spacer bar placement   LEFT HEART CATHETERIZATION WITH CORONARY ANGIOGRAM N/A 02/14/2012   Procedure: LEFT HEART CATHETERIZATION WITH CORONARY ANGIOGRAM;  Surgeon: Leonie Man, MD;  Location: [facility] CATH LAB;  Service: Cardiovascular:: no evidence of obstructive coronary disease ,low EDP with normal EF   LEV DOPPLER  05/29/2012   RIGHT EXTREM. NORMAL VENOUS DUPLEX   NM MYOCAR PERF WALL MOTION  08/2019   EF 55 to 65%.  No ischemia or infarction.  LOW RISK:   PACEMAKER PLACEMENT     REVISION TOTAL KNEE ARTHROPLASTY Left    TONSILLECTOMY     TOTAL KNEE ARTHROPLASTY Bilateral    TRANSTHORACIC ECHOCARDIOGRAM  11/2017   11/2017: EF 60-65%. Gr1 DD. PAP ~33 mmHg.  Mild TR. PPM leads in RA & RV.    TRIGGER FINGER RELEASE  11/22/2011   Procedure: RELEASE TRIGGER FINGER/A-1 PULLEY;  Surgeon: Meredith Pel, MD;  Location: [facility];  Service: Orthopedics;  Laterality: Left;   Left trigger thumb release   TRIGGER FINGER RELEASE Right yrs ago    Social History:  reports that she has never smoked. She has been exposed to tobacco smoke. She has never used smokeless tobacco. She reports that she does not drink alcohol and does not use drugs.   Allergies  Allergen Reactions   Iodine Other (See Comments)    ONLY IV--Makes unconscious   Iohexol Other (See Comments)     Code: SOB, Desc: cardiac arrest w/ iv contrast, has never used 13 hr prep//alice calhoun, Onset Date: [id number]    Metoclopramide Hcl Other (See Comments)    suicidal   Sulfa Antibiotics Hives  Lactose Intolerance (Gi) Diarrhea   Lansoprazole Nausea And Vomiting    Family History  Problem Relation Age of Onset   Cancer Mother    Diabetes Mother    Cancer - Prostate Father    Diabetes Father    Retinal detachment Son    Diabetes Sister    Diabetes Brother    Diabetes Paternal Grandmother       Prior to Admission medications   Medication Sig Start Date End Date Taking? Authorizing Provider  acetaminophen (TYLENOL) 325 MG tablet Take 2 tablets (650 mg total) by mouth every 6 (six) hours as needed for up to 30 doses for mild pain or moderate pain. 07/21/20  Yes Trifan, Carola Rhine, MD  albuterol (VENTOLIN HFA) 108 (90 Base) MCG/ACT inhaler INHALE 2 INHALATIONS 3 TIMES A DAY AS NEEDED FOR COUGH/WHEEZE Patient taking differently: Inhale 2 puffs into the lungs 3 (three) times daily as needed for wheezing (cough). 12/23/20  Yes Tanda Rockers, MD  busPIRone (BUSPAR) 10 MG tablet Take 1 tablet (10 mg total) by mouth every morning. 06/03/14  Yes Garvin Fila, MD  Cholecalciferol (VITAMIN D-3) 25 MCG (1000 UT) CAPS Take 3,000 Units by mouth every morning.   Yes [provider]  clonazePAM (KLONOPIN) 0.5 MG tablet Take 1 mg by mouth 2 (two) times daily.   Yes [provider]  clopidogrel (PLAVIX) 75 MG tablet Take 75 mg by mouth every morning.   Yes [provider]   dicyclomine (BENTYL) 20 MG tablet Take 40 mg by mouth in the morning and at bedtime. for cramping   Yes [provider]  donepezil (ARICEPT) 10 MG tablet TAKE 1 TABLET BY MOUTH EVERYDAY AT BEDTIME Patient taking differently: Take 10 mg by mouth at bedtime. 10/04/21  Yes McCue, Janett Billow, NP  famotidine (PEPCID) 20 MG tablet Take 20 mg by mouth daily. 10/04/21  Yes [provider]  glipiZIDE (GLUCOTROL) 10 MG tablet Take 2 tablets (20 mg total) by mouth 2 (two) times daily before a meal. 08/16/21  Yes Nita Sells, MD  guaiFENesin (MUCINEX) 600 MG 12 hr tablet Take 600 mg by mouth 2 (two) times daily as needed for cough.   Yes [provider]  insulin NPH (HUMULIN N,NOVOLIN N) 100 UNIT/ML injection Inject 5-20 Units into the skin See admin instructions. In the morning:  If cbg >300, then give 20 units. If cbg < or = 300, then give 15 unit At night: If cbg >250, then give 5 units. If cbg < or = 250, then give no insulin   Yes [provider]  insulin regular (NOVOLIN R,HUMULIN R) 100 units/mL injection Inject 0-30 Units into the skin See admin instructions. At breakfast: if cbg < 300, then give 25 units. If cbg > or = 300, then give 30 units. After dinner: goal cbgs is 150 and give insulin dose to reach this. For every 100 cbg over 150, then give 10 units of insulin.   Yes [provider]  levothyroxine (SYNTHROID) 100 MCG tablet Take 100 mcg by mouth daily before breakfast.   Yes [provider]  lidocaine (LIDODERM) 5 % Place 2 patches onto the skin daily as needed (pain). Remove & Discard patch within 12 hours or as directed by MD   Yes [provider]  linaclotide (LINZESS) 290 MCG CAPS capsule Take 290 mcg by mouth daily as needed (for constipation).   Yes [provider]  Menthol-Methyl Salicylate (MUSCLE RUB) 10-15 % CREA Apply 1  application topically as needed for muscle pain.   Yes [provider]   nortriptyline (PAMELOR) 50 MG capsule Take 50 mg by mouth at bedtime. 09/02/21  Yes [provider]  pantoprazole (PROTONIX) 40 MG tablet Take 40 mg by mouth 2 (two) times daily. 10/04/21  Yes [provider]  potassium chloride SA (KLOR-CON M) 20 MEQ tablet Take 20 mEq by mouth 2 (two) times daily. Take 1/2 tablet twice daily.   Yes [provider]  rosuvastatin (CRESTOR) 20 MG tablet Take 20 mg by mouth daily.  03/13/17  Yes [provider]  solifenacin (VESICARE) 5 MG tablet Take 5 mg by mouth daily as needed (to prevent UTI). 02/24/19  Yes [provider]  terconazole (TERAZOL 7) 0.4 % vaginal cream Place 1 applicator vaginally daily as needed for itching. 02/12/19  Yes [provider]  esomeprazole (NEXIUM) 40 MG capsule Take 40 mg by mouth daily. Patient not taking: Reported on 08/14/2021 06/08/20   [provider]  OXYGEN Inhale 2 L into the lungs as needed. Used at overnight with a Cytogeneticist, Historical, MD  SYMBICORT 80-4.5 MCG/ACT inhaler INHALE 2 PUFFS FIRST THING IN THE MORNING THEN ANOTHER 2 PUFFS ABOUT 12 HOURS LATER Patient not taking: Reported on 08/14/2021 10/27/20   Tanda Rockers, MD    Physical Exam: BP 111/66 (BP Location: Right Arm)   Pulse 81   Temp 97.7 F (36.5 C)   Resp 16   SpO2 100%   General: 81 y.o. year-old female well developed well nourished in no acute distress.  Alert and oriented x2. Cardiovascular: Irregular rate and rhythm with no rubs or gallops.  No thyromegaly or JVD noted.  Trace lower extremity edema bilaterally. Respiratory: Clear to auscultation with no wheezes or rales. Good inspiratory effort. Abdomen: Soft nontender nondistended with normal bowel sounds x4 quadrants. Muskuloskeletal: No cyanosis or clubbing.  Lower extremity edema bilaterally. Neuro: CN II-XII intact, strength, sensation, reflexes Skin: No ulcerative lesions noted or rashes Psychiatry: Judgement and insight  appear normal. Mood is appropriate for condition and setting          Labs on Admission:  Basic Metabolic Panel: Recent Labs  Lab 10/05/21 2054  NA 137  K 4.2  CL 104  CO2 22  GLUCOSE 189*  BUN 23  CREATININE 1.59*  CALCIUM 9.5  MG 1.7   Liver Function Tests: Recent Labs  Lab 10/05/21 2054  AST 20  ALT 14  ALKPHOS 69  BILITOT 1.2  PROT 6.6  ALBUMIN 3.1*   Recent Labs  Lab 10/05/21 2054  LIPASE 25   No results for input(s): "AMMONIA" in the last 168 hours. CBC: Recent Labs  Lab 10/05/21 2054  WBC 10.2  NEUTROABS 7.7  HGB 12.5  HCT 39.8  MCV 100.3*  PLT 254   Cardiac Enzymes: No results for input(s): "CKTOTAL", "CKMB", "CKMBINDEX", "TROPONINI" in the last 168 hours.  BNP (last 3 results) No results for input(s): "BNP" in the last 8760 hours.  ProBNP (last 3 results) No results for input(s): "PROBNP" in the last 8760 hours.  CBG: No results for input(s): "GLUCAP" in the last 168 hours.  Radiological Exams on Admission: CT ABDOMEN PELVIS WO CONTRAST  Result Date: 10/05/2021 CLINICAL DATA:  Nausea and vomiting. Constipation. Generalized weakness. EXAM: CT ABDOMEN AND PELVIS WITHOUT CONTRAST TECHNIQUE: Multidetector CT imaging of the abdomen and pelvis was performed following the standard protocol without IV contrast. RADIATION DOSE REDUCTION: This exam was performed  according to the departmental dose-optimization program which includes automated exposure control, adjustment of the mA and/or kV according to patient size and/or use of iterative reconstruction technique. COMPARISON:  08/05/2013. FINDINGS: Lower chest: The heart is normal in size and pacemaker leads are noted in the right heart. Mild atelectasis or scarring is present at the lung bases. Hepatobiliary: No focal liver abnormality is seen. Status post cholecystectomy. No biliary dilatation. Pancreas: Pancreas is atrophic. There is a hypodensity in the tail of the pancreas with cystic attenuation  measuring 1.2 cm. No pancreatic ductal dilatation. Spleen: Normal in size without focal abnormality. Adrenals/Urinary Tract: Adrenal glands are unremarkable. Kidneys are normal, without renal calculi, focal lesion, or hydronephrosis. Bladder is unremarkable. Stomach/Bowel: Small hiatal hernia is present. Stomach is otherwise within normal limits. Appendix is not seen. No evidence of acute bowel wall thickening, distention, or inflammatory changes. No free air or pneumatosis. A moderate amount of retained stool is present in the colon. Vascular/Lymphatic: Aortic atherosclerosis. No enlarged abdominal or pelvic lymph nodes. Reproductive: Status post hysterectomy. No adnexal masses. Other: No abdominopelvic ascites. Pelvic floor laxity is noted and unchanged from the previous exam. Musculoskeletal: Degenerative changes in the thoracolumbar spine. No acute osseous abnormality. IMPRESSION: 1. No acute intra-abdominal process. 2. Moderate amount of retained stool in the colon, compatible with history of constipation. 3. Small hiatal hernia. 4. 1.2 cm cystic structure in the tail of the pancreas. Two year follow-up is recommended for surveillance. 5. Aortic atherosclerosis. Electronically Signed   By: Brett Fairy M.D.   On: 10/05/2021 20:48    EKG: I independently viewed the EKG done and my findings are as followed: Atrial fibrillation, 87.  Nonspecific ST-T changes.  QTc 571.  Assessment/Plan Present on Admission:  Chest pain  Principal Problem:   Chest pain  Atypical chest pain Received a full dose aspirin in the ED Initial troponin 31, repeat pending. No evidence of acute ischemia on twelve-lead EKG Chest pain has resolved at the time of this visit. Follow 2D echo  Sick sinus syndrome status post pacemaker placement Monitor on telemetry.  Type 2 diabetes with hyperglycemia Obtain hemoglobin A1c Start insulin sliding scale every 4 hours while NPO Start gentle IV fluid hydration normal saline at  50 cc/h x 1 day.  Chronic anxiety/depression Resume home regimen  CKD 3B Appears to be at her baseline creatinine 1.59 with GFR of 32. Avoid nephrotoxic agents dehydration and hypotension Monitor urine output with strict I's and O Repeat renal panel in the morning  Ambulatory dysfunction with prior falls PT OT assessment in the morning Fall precautions  Hypothyroidism Resume home levothyroxine     DVT prophylaxis: Subcu Lovenox daily  Code Status: Full code  Family Communication: None at bedside  Disposition Plan: Admitted to telemetry unit  Consults called: None.  Admission status: Observation status.   Status is: Observation    Kayleen Memos MD [facility] Pager [phone]  If 7PM-7AM, please contact night-coverage [email or web address] Password [email or web address]  10/06/2021, 2:00 AM

## 2021-10-06 NOTE — Consult Note (Addendum)
[facility] Consult  Referring Provider: [facility] hospitalist/Dr. Wynelle Cleveland Primary Care Physician:  Shon Baton, MD Primary Gastroenterologist: Dr. Watt Climes  Reason for Consultation: Nausea, gagging, weight loss, loss of appetite  HPI: Tara Jackson is a 81 y.o. female was admitted on 10/05/2021 due to weakness and complaining of pain in the chest area around pacemaker site. Most of the history is obtained from the patient's husband, who is 46 and present at bedside. Patient contributed to some of the history although she has evidence of memory loss. It seems that for the last 3 weeks patient has developed a decline in her appetite associated with almost 20 pound weight loss. She was seen at her primary care physician and advised to stop Nexium while continuing famotidine and pantoprazole. Patient seems to develop frequent episodes of gagging when she sits up from a lying position, nausea, but has not vomited food. She denies acid reflux or heartburn. She denies difficulty swallowing or pain on swallowing. She has history of chronic constipation and takes Linzess 290 mcg as needed, however for for the last 7 days, she has been having a bowel movement every day. As per husband, she uses a bedpan or a bedside commode. Her husband is a primary caregiver and states that she is usually lying on the bed. She has history of depression and anxiety. Patient denies abdominal pain. She denies fevers. She denies noticing blood in stool or black stools.  Prior GI work-up: Colonoscopy 2012-internal and external hemorrhoids, sigmoid diverticulosis, hyperplastic polyp removed EGD 2018: Removal of duodenal adenoma EGD 2015:Removal of duodenal adenoma EGD 2013:Removal of duodenal adenoma EGD 2011:Removal of duodenal adenoma CT 10/05/2021: Small hiatal hernia, evidence of constipation, 1.2 cm cystic lesion of pancreatic tail  Patient is on Plavix, dose today morning.   Past Medical History:   Diagnosis Date   Anemia    Anxiety    Arthritis    Asthma    Brittle bone disease    CKD (chronic kidney disease)    Complication of anesthesia    hard to wake up after anesthesia, trouble turning head   Coronary artery disease, non-occlusive 2014   a. minimal CAD by cath in 2014 with 40% RI stenosis. b. low-risk NST in 11/2014.   Depression    Diabetes mellitus type 2, insulin dependent (HCC)    Diabetic neuropathy (HCC)    Diabetic retinopathy ([place])    NPDR OU   Diastolic dysfunction, left ventricle 11/30/14   EF 55%, grade 1 DD   DVT (deep venous thrombosis) ([place])    "18 in RLE; 7 LLE prior to PE" (03/24/2015)   Family history of adverse reaction to anesthesia    " the whole family is hard to wake up"   Glaucoma    H/O hiatal hernia    Hyperlipidemia    Hypertension associated with diabetes ([place])    Hypertensive retinopathy    OU   Hypothyroidism    Kidney stones    "passed them"   Macular degeneration    Exu OU   Memory loss 06/03/2014   On home oxygen therapy    "2L oxygen concentrator @ night"   Osteoporosis    Oxygen desaturation during sleep    wears 2 liters of oxygen at night    Palpitations 35years ago   Cardionet monitor - revealed mostly normal sinus rhythm, sinus bradycardia with first-degree A-V block heart rates mostly in the 50s and 60s with some 70s. No arrhythmias, PVCs or PACs noted.  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 s/p [employer] PPM in 03/2015 by Dr. Lovena Le secondary to sinus node dysfunction.     TIA (transient ischemic attack) March 2014    Past Surgical History:  Procedure Laterality Date   ABDOMINAL HYSTERECTOMY     APPENDECTOMY     BACK SURGERY     steroid inj   CATARACT EXTRACTION Bilateral    CATARACT EXTRACTION W/ INTRAOCULAR LENS  IMPLANT, BILATERAL Bilateral 2000s   CHOLECYSTECTOMY     EP IMPLANTABLE DEVICE N/A 03/24/2015   [facility] [employer] PPM, Dr. Lovena Le   ESOPHAGOGASTRODUODENOSCOPY  08/09/2011   Procedure: ESOPHAGOGASTRODUODENOSCOPY (EGD);  Surgeon: Jeryl Columbia, MD;  Location: [facility];  Service: Endoscopy;  Laterality: N/A;   ESOPHAGOGASTRODUODENOSCOPY (EGD) WITH PROPOFOL N/A 11/12/2013   Procedure: ESOPHAGOGASTRODUODENOSCOPY (EGD) WITH PROPOFOL;  Surgeon: Jeryl Columbia, MD;  Location: [facility];  Service: Endoscopy;  Laterality: N/A;   ESOPHAGOGASTRODUODENOSCOPY (EGD) WITH PROPOFOL N/A 05/05/2016   Procedure: ESOPHAGOGASTRODUODENOSCOPY (EGD) WITH PROPOFOL;  Surgeon: Clarene Essex, MD;  Location: [facility] ENDOSCOPY;  Service: Endoscopy;  Laterality: N/A;   EYE SURGERY Bilateral    Cat Sx OU   GLAUCOMA SURGERY Bilateral 2000s   "laser"   HOT HEMOSTASIS  08/09/2011   Procedure: HOT HEMOSTASIS (ARGON PLASMA COAGULATION/BICAP);  Surgeon: Jeryl Columbia, MD;  Location: [facility];  Service: Endoscopy;  Laterality: N/A;   HOT HEMOSTASIS N/A 11/12/2013   Procedure: HOT HEMOSTASIS (ARGON PLASMA COAGULATION/BICAP);  Surgeon: Jeryl Columbia, MD;  Location: [facility];  Service: Endoscopy;  Laterality: N/A;   INSERT / REPLACE / REMOVE PACEMAKER  03/24/2015   IRIDOTOMY / IRIDECTOMY Bilateral    JOINT REPLACEMENT     KNEE SURGERY Left done with 2nd left knee replacement   spacer bar placement   LEFT HEART CATHETERIZATION WITH CORONARY ANGIOGRAM N/A 02/14/2012   Procedure: LEFT HEART CATHETERIZATION WITH CORONARY ANGIOGRAM;  Surgeon: Leonie Man, MD;  Location: [facility] CATH LAB;  Service: Cardiovascular:: no evidence of obstructive coronary disease ,low EDP with normal EF   LEV  DOPPLER  05/29/2012   RIGHT EXTREM. NORMAL VENOUS DUPLEX   NM MYOCAR PERF WALL MOTION  08/2019   EF 55 to 65%.  No ischemia or infarction.  LOW RISK:   PACEMAKER PLACEMENT     REVISION TOTAL KNEE ARTHROPLASTY Left    TONSILLECTOMY     TOTAL KNEE ARTHROPLASTY Bilateral    TRANSTHORACIC ECHOCARDIOGRAM  11/2017   11/2017: EF 60-65%. Gr1 DD. PAP ~33 mmHg.  Mild TR. PPM leads in RA & RV.    TRIGGER FINGER RELEASE  11/22/2011   Procedure: RELEASE TRIGGER FINGER/A-1 PULLEY;  Surgeon: Meredith Pel, MD;  Location: [facility];  Service: Orthopedics;  Laterality: Left;  Left trigger thumb release   TRIGGER FINGER RELEASE Right yrs ago    Prior to Admission medications   Medication Sig Start Date End Date Taking? Authorizing  Provider  acetaminophen (TYLENOL) 325 MG tablet Take 2 tablets (650 mg total) by mouth every 6 (six) hours as needed for up to 30 doses for mild pain or moderate pain. 07/21/20  Yes Trifan, Carola Rhine, MD  albuterol (VENTOLIN HFA) 108 (90 Base) MCG/ACT inhaler INHALE 2 INHALATIONS 3 TIMES A DAY AS NEEDED FOR COUGH/WHEEZE Patient taking differently: Inhale 2 puffs into the lungs 3 (three) times daily as needed for wheezing (cough). 12/23/20  Yes Tanda Rockers, MD  busPIRone (BUSPAR) 10 MG tablet Take 1 tablet (10 mg total) by mouth every morning. 06/03/14  Yes Garvin Fila, MD  Cholecalciferol (VITAMIN D-3) 25 MCG (1000 UT) CAPS Take 3,000 Units by mouth every morning.   Yes [provider]  clonazePAM (KLONOPIN) 0.5 MG tablet Take 1 mg by mouth 2 (two) times daily.   Yes [provider]  clopidogrel (PLAVIX) 75 MG tablet Take 75 mg by mouth every morning.   Yes [provider]  dicyclomine (BENTYL) 20 MG tablet Take 40 mg by mouth in the morning and at bedtime. for cramping   Yes [provider]  donepezil (ARICEPT) 10 MG tablet TAKE 1 TABLET BY MOUTH EVERYDAY AT BEDTIME Patient taking differently: Take 10 mg by mouth at bedtime. 10/04/21  Yes  McCue, Janett Billow, NP  famotidine (PEPCID) 20 MG tablet Take 20 mg by mouth daily. 10/04/21  Yes [provider]  glipiZIDE (GLUCOTROL) 10 MG tablet Take 2 tablets (20 mg total) by mouth 2 (two) times daily before a meal. 08/16/21  Yes Nita Sells, MD  guaiFENesin (MUCINEX) 600 MG 12 hr tablet Take 600 mg by mouth 2 (two) times daily as needed for cough.   Yes [provider]  insulin NPH (HUMULIN N,NOVOLIN N) 100 UNIT/ML injection Inject 5-20 Units into the skin See admin instructions. In the morning:  If cbg >300, then give 20 units. If cbg < or = 300, then give 15 unit At night: If cbg >250, then give 5 units. If cbg < or = 250, then give no insulin   Yes [provider]  insulin regular (NOVOLIN R,HUMULIN R) 100 units/mL injection Inject 0-30 Units into the skin See admin instructions. At breakfast: if cbg < 300, then give 25 units. If cbg > or = 300, then give 30 units. After dinner: goal cbgs is 150 and give insulin dose to reach this. For every 100 cbg over 150, then give 10 units of insulin.   Yes [provider]  levothyroxine (SYNTHROID) 100 MCG tablet Take 100 mcg by mouth daily before breakfast.   Yes [provider]  lidocaine (LIDODERM) 5 % Place 2 patches onto the skin daily as needed (pain). Remove & Discard patch within 12 hours or as directed by MD   Yes [provider]  linaclotide (LINZESS) 290 MCG CAPS capsule Take 290 mcg by mouth daily as needed (for constipation).   Yes [provider]  Menthol-Methyl Salicylate (MUSCLE RUB) 10-15 % CREA Apply 1 application topically as needed for muscle pain.   Yes [provider]  nortriptyline (PAMELOR) 50 MG capsule Take 50 mg by mouth at bedtime. 09/02/21  Yes [provider]  pantoprazole (PROTONIX) 40 MG tablet Take 40 mg by mouth 2 (two) times daily. 10/04/21  Yes [provider]  potassium chloride SA (KLOR-CON M) 20 MEQ tablet Take 20 mEq by  mouth 2 (two) times daily. Take 1/2 tablet twice daily.   Yes [provider]  rosuvastatin (CRESTOR) 20 MG tablet Take 20 mg by mouth daily.  03/13/17  Yes [provider]  solifenacin (VESICARE) 5 MG tablet Take 5 mg by mouth daily as needed (to prevent UTI). 02/24/19  Yes [provider]  terconazole (TERAZOL 7) 0.4 % vaginal cream Place 1 applicator vaginally daily as needed for itching. 02/12/19  Yes [provider]  esomeprazole (NEXIUM) 40 MG capsule Take 40 mg by mouth daily. Patient not taking: Reported on 08/14/2021 06/08/20   [provider]  OXYGEN Inhale 2 L into the lungs as needed. Used at overnight with a Cytogeneticist, Historical, MD  SYMBICORT 80-4.5 MCG/ACT inhaler INHALE 2 PUFFS FIRST THING IN THE MORNING THEN ANOTHER 2 PUFFS ABOUT 12 HOURS LATER Patient not taking: Reported on 08/14/2021 10/27/20   Tanda Rockers, MD    Current Facility-Administered Medications  Medication Dose Route Frequency Provider Last Rate Last Admin   0.9 %  sodium chloride infusion   Intravenous Continuous Kayleen Memos, DO 50 mL/hr at 10/06/21 0354 New Bag at 10/06/21 0354   acetaminophen (TYLENOL) tablet 650 mg  650 mg Oral Q6H PRN Kayleen Memos, DO   650 mg at 10/06/21 1026   busPIRone (BUSPAR) tablet 10 mg  10 mg Oral q morning Kayleen Memos, DO   10 mg at 10/06/21 1026   clonazepam (KLONOPIN) disintegrating tablet 0.25 mg  0.25 mg Oral BID Irene Pap N, DO   0.25 mg at 10/06/21 1026   clopidogrel (PLAVIX) tablet 75 mg  75 mg Oral q morning Kayleen Memos, DO   75 mg at 10/06/21 1026   diclofenac Sodium (VOLTAREN) 1 % topical gel 2 g  2 g Topical TID Debbe Odea, MD       donepezil (ARICEPT) tablet 10 mg  10 mg Oral QHS Hall, Carole N, DO       enoxaparin (LOVENOX) injection 40 mg  40 mg Subcutaneous Q24H Hall, Carole N, DO       famotidine (PEPCID) tablet 20 mg  20 mg Oral Daily Irene Pap N, DO   20 mg at 10/06/21 1026   insulin aspart  (novoLOG) injection 0-9 Units  0-9 Units Subcutaneous Q4H Irene Pap N, DO   2 Units at 10/06/21 1306   levothyroxine (SYNTHROID) tablet 100 mcg  100 mcg Oral Q0600 Kayleen Memos, DO   100 mcg at 10/06/21 0645   melatonin tablet 5 mg  5 mg Oral QHS PRN Kayleen Memos, DO       mometasone-formoterol (DULERA) 100-5 MCG/ACT inhaler 2 puff  2 puff Inhalation BID Irene Pap N, DO       nortriptyline (PAMELOR) capsule 50 mg  50 mg Oral QHS Hall, Carole N, DO       prochlorperazine (COMPAZINE) injection 10 mg  10 mg Intravenous Q6H PRN Hall, Carole N, DO       rosuvastatin (CRESTOR) tablet 20 mg  20 mg Oral Daily [place], Carole N, DO   20 mg at 10/06/21 1031    Allergies as of 10/05/2021 - Review Complete 10/05/2021  Allergen Reaction Noted   Iodine Other (See Comments) 05/30/2008   Iohexol Other (See Comments) 11/11/2008   Metoclopramide hcl Other (See Comments) 07/09/2008   Sulfa antibiotics Hives 08/08/2011   Lactose intolerance (gi) Diarrhea 10/25/2011   Lansoprazole Nausea And Vomiting 05/30/2008    Family History  Problem Relation Age of Onset   Cancer Mother    Diabetes Mother  Cancer - Prostate Father    Diabetes Father    Retinal detachment Son    Diabetes Sister    Diabetes Brother    Diabetes Paternal Grandmother     Social History   Socioeconomic History   Marital status: Married    Spouse name: james   Number of children: 2   Years of education: college   Highest education level: Not on file  Occupational History   Occupation: retired  Tobacco Use   Smoking status: Never    Passive exposure: Yes   Smokeless tobacco: Never  Vaping Use   Vaping Use: Never used  Substance and Sexual Activity   Alcohol use: No    Alcohol/week: 0.0 standard drinks of alcohol   Drug use: No   Sexual activity: Yes  Other Topics Concern   Not on file  Social History Narrative   Married [name].) married 64 years.   Disabled.   Arboriculturist.   Right handed.   Caffeine  two sodas daily.    Social Determinants of Health   Financial Resource Strain: Not on file  Food Insecurity: Not on file  Transportation Needs: Not on file  Physical Activity: Not on file  Stress: Not on file  Social Connections: Not on file  Intimate Partner Violence: Not on file    Review of Systems: Positive for: GI: Described in detail in HPI.    Gen: Involuntary weight loss, denies any fever, chills, rigors, night sweats, anorexia, fatigue, weakness, malaise and sleep disorder CV:  chest pain,  denies angina, palpitations, syncope, orthopnea, PND, peripheral edema and claudication. Resp: Denies dyspnea, cough, sputum, wheezing, coughing up blood. GU : Denies urinary burning, blood in urine, urinary frequency, urinary hesitancy, nocturnal urination, and urinary incontinence. MS: Denies joint pain or swelling.  Denies muscle weakness, cramps, atrophy.  Derm: Denies rash, itching, oral ulcerations, hives, unhealing ulcers.  Psych: Confusion, depression, anxiety, memory loss, denies suicidal ideation or hallucinations. Heme: Denies bruising, bleeding, and enlarged lymph nodes. Neuro:  Dizziness, denies any headaches,  paresthesias. Endo:  DM, hypothyroid, denies any problems with adrenal function.  Physical Exam: Vital signs in last 24 hours: Temp:  [97.5 F (36.4 C)-98.8 F (37.1 C)] 98.3 F (36.8 C) (08/30 0944) Pulse Rate:  [79-113] 84 (08/30 0944) Resp:  [13-25] 16 (08/30 0944) BP: (100-160)/(60-105) 130/66 (08/30 0944) SpO2:  [96 %-100 %] 100 % (08/30 0944) Weight:  [96 kg] 96 kg (08/30 1027) Last BM Date :  (unsure)  General:   Alert,  Well-developed, overweight, pleasant and cooperative in NAD Head:  Normocephalic and atraumatic. Eyes:  Sclera clear, no icterus.   Conjunctiva pink. Ears:  Normal auditory acuity. Nose:  No deformity, discharge,  or lesions. Mouth:  No deformity or lesions.  Oropharynx pink & moist. Neck:  Supple; no masses or thyromegaly. Lungs:   Clear throughout to auscultation.   No wheezes, crackles, or rhonchi. No acute distress. Heart:  Regular rate and rhythm; no murmurs, clicks, rubs,  or gallops. Extremities:  Without clubbing or edema. Neurologic:  Alert and  oriented x4;  grossly normal neurologically. Skin:  Intact without significant lesions or rashes. Psych: Flat affect. Abdomen:  Soft, nontender and nondistended. No masses, hepatosplenomegaly or hernias noted. Normal bowel sounds, without guarding, and without rebound.         Lab Results: Recent Labs    10/05/21 2054 10/06/21 0358  WBC 10.2 8.5  HGB 12.5 12.9  HCT 39.8 41.9  PLT 254 259  BMET Recent Labs    10/05/21 2054 10/06/21 0358  NA 137 139  K 4.2 4.3  CL 104 104  CO2 22 21*  GLUCOSE 189* 181*  BUN 23 22  CREATININE 1.59* 1.54*  CALCIUM 9.5 9.8   LFT Recent Labs    10/05/21 2054  PROT 6.6  ALBUMIN 3.1*  AST 20  ALT 14  ALKPHOS 69  BILITOT 1.2   PT/INR No results for input(s): "LABPROT", "INR" in the last 72 hours.  Studies/Results: CT ABDOMEN PELVIS WO CONTRAST  Result Date: 10/05/2021 CLINICAL DATA:  Nausea and vomiting. Constipation. Generalized weakness. EXAM: CT ABDOMEN AND PELVIS WITHOUT CONTRAST TECHNIQUE: Multidetector CT imaging of the abdomen and pelvis was performed following the standard protocol without IV contrast. RADIATION DOSE REDUCTION: This exam was performed according to the departmental dose-optimization program which includes automated exposure control, adjustment of the mA and/or kV according to patient size and/or use of iterative reconstruction technique. COMPARISON:  08/05/2013. FINDINGS: Lower chest: The heart is normal in size and pacemaker leads are noted in the right heart. Mild atelectasis or scarring is present at the lung bases. Hepatobiliary: No focal liver abnormality is seen. Status post cholecystectomy. No biliary dilatation. Pancreas: Pancreas is atrophic. There is a hypodensity in the tail of the  pancreas with cystic attenuation measuring 1.2 cm. No pancreatic ductal dilatation. Spleen: Normal in size without focal abnormality. Adrenals/Urinary Tract: Adrenal glands are unremarkable. Kidneys are normal, without renal calculi, focal lesion, or hydronephrosis. Bladder is unremarkable. Stomach/Bowel: Small hiatal hernia is present. Stomach is otherwise within normal limits. Appendix is not seen. No evidence of acute bowel wall thickening, distention, or inflammatory changes. No free air or pneumatosis. A moderate amount of retained stool is present in the colon. Vascular/Lymphatic: Aortic atherosclerosis. No enlarged abdominal or pelvic lymph nodes. Reproductive: Status post hysterectomy. No adnexal masses. Other: No abdominopelvic ascites. Pelvic floor laxity is noted and unchanged from the previous exam. Musculoskeletal: Degenerative changes in the thoracolumbar spine. No acute osseous abnormality. IMPRESSION: 1. No acute intra-abdominal process. 2. Moderate amount of retained stool in the colon, compatible with history of constipation. 3. Small hiatal hernia. 4. 1.2 cm cystic structure in the tail of the pancreas. Two year follow-up is recommended for surveillance. 5. Aortic atherosclerosis. Electronically Signed   By: Brett Fairy M.D.   On: 10/05/2021 20:48    Impression: Nausea, gagging, pain over left side of the chest(pacemaker site) Loss of appetite Weight loss Chronic constipation   History of anxiety, depression Diabetes History of duodenal adenoma  Plan: Upper GI series for further evaluation We may need to order a gastric emptying scan if upper GI series is unremarkable.  Patient is on Plavix, last dose today, if EGD is needed for biopsies, Plavix will need to be on hold.  CAT scan is reassuring. Patient appears depressed and it appears that she is gagging but not vomiting. Differential diagnosis to consider includes diabetic gastroparesis, gastritis, H. pylori infection  although her symptoms could be a reflection of underlying depression.  Will order H. pylori serology as well. Continue famotidine 20 mg once a day.   LOS: 0 days   Ronnette Juniper, MD  10/06/2021, 1:08 PM

## 2021-10-07 ENCOUNTER — Inpatient Hospital Stay (HOSPITAL_COMMUNITY): Payer: Medicare HMO

## 2021-10-07 DIAGNOSIS — R112 Nausea with vomiting, unspecified: Secondary | ICD-10-CM | POA: Diagnosis not present

## 2021-10-07 DIAGNOSIS — R072 Precordial pain: Secondary | ICD-10-CM | POA: Diagnosis not present

## 2021-10-07 LAB — BASIC METABOLIC PANEL
Anion gap: 14 (ref 5–15)
BUN: 20 mg/dL (ref 8–23)
CO2: 21 mmol/L — ABNORMAL LOW (ref 22–32)
Calcium: 9.1 mg/dL (ref 8.9–10.3)
Chloride: 105 mmol/L (ref 98–111)
Creatinine, Ser: 1.52 mg/dL — ABNORMAL HIGH (ref 0.44–1.00)
GFR, Estimated: 34 mL/min — ABNORMAL LOW (ref >60)
Glucose, Bld: 224 mg/dL — ABNORMAL HIGH (ref 70–99)
Potassium: 4.7 mmol/L (ref 3.5–5.1)
Sodium: 140 mmol/L (ref 135–145)

## 2021-10-07 LAB — GLUCOSE, CAPILLARY
Glucose-Capillary: 117 mg/dL — ABNORMAL HIGH (ref 70–99)
Glucose-Capillary: 148 mg/dL — ABNORMAL HIGH (ref 70–99)
Glucose-Capillary: 157 mg/dL — ABNORMAL HIGH (ref 70–99)
Glucose-Capillary: 175 mg/dL — ABNORMAL HIGH (ref 70–99)
Glucose-Capillary: 190 mg/dL — ABNORMAL HIGH (ref 70–99)
Glucose-Capillary: 255 mg/dL — ABNORMAL HIGH (ref 70–99)

## 2021-10-07 LAB — H. PYLORI ANTIBODY, IGG: H Pylori IgG: 1.44 Index Value — ABNORMAL HIGH (ref 0.00–0.79)

## 2021-10-07 MED ORDER — SODIUM CHLORIDE 0.9 % IV SOLN: INTRAVENOUS | Status: DC

## 2021-10-07 MED ORDER — PANTOPRAZOLE SODIUM 40 MG PO TBEC
40.0000 mg | DELAYED_RELEASE_TABLET | Freq: Every day | ORAL | Status: DC
  Administered 2021-10-07 – 2021-10-13 (×7): 40 mg via ORAL
  Filled 2021-10-07 (×7): qty 1

## 2021-10-07 MED ORDER — SODIUM CHLORIDE 0.9 % IV BOLUS
500.0000 mL | Freq: Once | INTRAVENOUS | Status: AC
  Administered 2021-10-07: 500 mL via INTRAVENOUS

## 2021-10-07 NOTE — Progress Notes (Signed)
Subjective: Patient received Plavix at 9:26 AM today. She is on full liquid diet. Reports improvement in nausea. Complains more of urinary retention and lower abdominal pain.  Objective: Vital signs in last 24 hours: Temp:  [97.5 F (36.4 C)-98.4 F (36.9 C)] 97.5 F (36.4 C) (08/31 0924) Pulse Rate:  [70-118] 118 (08/31 0924) Resp:  [16-20] 16 (08/31 0924) BP: (94-119)/(54-70) 119/70 (08/31 0924) SpO2:  [94 %-99 %] 97 % (08/31 0924) Weight change:  Last BM Date : 10/06/21  PE: No pallor, no icterus GENERAL: Appears comfortable  ABDOMEN: Nondistended, mild lower abdominal tenderness, normoactive bowel sounds EXTREMITIES: No deformity  Lab Results: Results for orders placed or performed during the hospital encounter of 10/05/21 (from the past 48 hour(s))  CBC with Differential     Status: Abnormal   Collection Time: 10/05/21  8:54 PM  Result Value Ref Range   WBC 10.2 4.0 - 10.5 K/uL   RBC 3.97 3.87 - 5.11 MIL/uL   Hemoglobin 12.5 12.0 - 15.0 g/dL   HCT 39.8 36.0 - 46.0 %   MCV 100.3 (H) 80.0 - 100.0 fL   MCH 31.5 26.0 - 34.0 pg   MCHC 31.4 30.0 - 36.0 g/dL   RDW 14.3 11.5 - 15.5 %   Platelets 254 150 - 400 K/uL   nRBC 0.0 0.0 - 0.2 %   Neutrophils Relative % 75 %   Neutro Abs 7.7 1.7 - 7.7 K/uL   Lymphocytes Relative 16 %   Lymphs Abs 1.7 0.7 - 4.0 K/uL   Monocytes Relative 7 %   Monocytes Absolute 0.7 0.1 - 1.0 K/uL   Eosinophils Relative 0 %   Eosinophils Absolute 0.0 0.0 - 0.5 K/uL   Basophils Relative 1 %   Basophils Absolute 0.1 0.0 - 0.1 K/uL   Immature Granulocytes 1 %   Abs Immature Granulocytes 0.06 0.00 - 0.07 K/uL    Comment: Performed at [facility], [place] [address] [place], [place] [address]  Comprehensive metabolic panel     Status: Abnormal   Collection Time: 10/05/21  8:54 PM  Result Value Ref Range   Sodium 137 135 - 145 mmol/L   Potassium 4.2 3.5 - 5.1 mmol/L   Chloride 104 98 - 111 mmol/L   CO2 22 22 - 32 mmol/L    Glucose, Bld 189 (H) 70 - 99 mg/dL    Comment: Glucose reference range applies only to samples taken after fasting for at least 8 hours.   BUN 23 8 - 23 mg/dL   Creatinine, Ser 1.59 (H) 0.44 - 1.00 mg/dL   Calcium 9.5 8.9 - 10.3 mg/dL   Total Protein 6.6 6.5 - 8.1 g/dL   Albumin 3.1 (L) 3.5 - 5.0 g/dL   AST 20 15 - 41 U/L   ALT 14 0 - 44 U/L   Alkaline Phosphatase 69 38 - 126 U/L   Total Bilirubin 1.2 0.3 - 1.2 mg/dL   GFR, Estimated 32 (L) >60 mL/min    Comment: (NOTE) Calculated using the CKD-EPI Creatinine Equation (2021)    Anion gap 11 5 - 15    Comment: Performed at [facility], [place] [address], [place], [place] [address]  Lipase, blood     Status: None   Collection Time: 10/05/21  8:54 PM  Result Value Ref Range   Lipase 25 11 - 51 U/L    Comment: Performed at [facility], [place] [address] [place], [place] [address]  Troponin I (High  Sensitivity)     Status: Abnormal   Collection Time: 10/05/21  8:54 PM  Result Value Ref Range   Troponin I (High Sensitivity) 31 (H) <18 ng/L    Comment: (NOTE) Elevated high sensitivity troponin I (hsTnI) values and significant  changes across serial measurements may suggest ACS but many other  chronic and acute conditions are known to elevate hsTnI results.  Refer to the "Links" section for chest pain algorithms and additional  guidance. Performed at [facility], [place] [address] [place], [place] [address]   Magnesium     Status: None   Collection Time: 10/05/21  8:54 PM  Result Value Ref Range   Magnesium 1.7 1.7 - 2.4 mg/dL    Comment: Performed at [facility], [place] [address], [place], [place] [address]  TSH     Status: None   Collection Time: 10/05/21  8:54 PM  Result Value Ref Range   TSH 0.676 0.350 - 4.500 uIU/mL    Comment: Performed by a 3rd Generation assay with a functional sensitivity of <=0.01 uIU/mL. Performed at [facility],  [place] [address] [place], [place] [address]   T4, free     Status: Abnormal   Collection Time: 10/05/21  8:54 PM  Result Value Ref Range   Free T4 1.51 (H) 0.61 - 1.12 ng/dL    Comment: (NOTE) Biotin ingestion may interfere with free T4 tests. If the results are inconsistent with the TSH level, previous test results, or the clinical presentation, then consider biotin interference. If needed, order repeat testing after stopping biotin. Performed at [facility], [place] [address] [place], [place] [address]   Urinalysis, Routine w reflex microscopic     Status: Abnormal   Collection Time: 10/05/21 11:44 PM  Result Value Ref Range   Color, Urine YELLOW YELLOW   APPearance CLEAR CLEAR   Specific Gravity, Urine 1.011 1.005 - 1.030   pH 5.0 5.0 - 8.0   Glucose, UA NEGATIVE NEGATIVE mg/dL   Hgb urine dipstick SMALL (A) NEGATIVE   Bilirubin Urine NEGATIVE NEGATIVE   Ketones, ur NEGATIVE NEGATIVE mg/dL   Protein, ur NEGATIVE NEGATIVE mg/dL   Nitrite NEGATIVE NEGATIVE   Leukocytes,Ua NEGATIVE NEGATIVE   RBC / HPF 0-5 0 - 5 RBC/hpf   WBC, UA 0-5 0 - 5 WBC/hpf   Bacteria, UA NONE SEEN NONE SEEN    Comment: Performed at [facility], [place] [address] [place], [place] [address]  Hemoglobin A1c     Status: Abnormal   Collection Time: 10/06/21  3:58 AM  Result Value Ref Range   Hgb A1c MFr Bld 7.8 (H) 4.8 - 5.6 %    Comment: (NOTE) Pre diabetes:          5.7%-6.4%  Diabetes:              >6.4%  Glycemic control for   <7.0% adults with diabetes    Mean Plasma Glucose 177.16 mg/dL    Comment: Performed at [place] [address], [place] [address]  CBC     Status: Abnormal   Collection Time: 10/06/21  3:58 AM  Result Value Ref Range   WBC 8.5 4.0 - 10.5 K/uL   RBC 4.14 3.87 - 5.11 MIL/uL   Hemoglobin 12.9 12.0 - 15.0 g/dL   HCT 41.9 36.0 - 46.0 %   MCV 101.2 (H) 80.0 - 100.0 fL   MCH 31.2 26.0 - 34.0 pg  MCHC 30.8 30.0 - 36.0 g/dL   RDW  14.4 11.5 - 15.5 %   Platelets 259 150 - 400 K/uL   nRBC 0.0 0.0 - 0.2 %    Comment: Performed at [facility], [place] [address], [place], [place] [address]  Basic metabolic panel     Status: Abnormal   Collection Time: 10/06/21  3:58 AM  Result Value Ref Range   Sodium 139 135 - 145 mmol/L   Potassium 4.3 3.5 - 5.1 mmol/L   Chloride 104 98 - 111 mmol/L   CO2 21 (L) 22 - 32 mmol/L   Glucose, Bld 181 (H) 70 - 99 mg/dL    Comment: Glucose reference range applies only to samples taken after fasting for at least 8 hours.   BUN 22 8 - 23 mg/dL   Creatinine, Ser 1.54 (H) 0.44 - 1.00 mg/dL   Calcium 9.8 8.9 - 10.3 mg/dL   GFR, Estimated 33 (L) >60 mL/min    Comment: (NOTE) Calculated using the CKD-EPI Creatinine Equation (2021)    Anion gap 14 5 - 15    Comment: Performed at [facility], [place] [address] [place], [place] [address]  Magnesium     Status: None   Collection Time: 10/06/21  3:58 AM  Result Value Ref Range   Magnesium 1.9 1.7 - 2.4 mg/dL    Comment: Performed at [facility], [place] [address], [place], [place] [address]  Phosphorus     Status: None   Collection Time: 10/06/21  3:58 AM  Result Value Ref Range   Phosphorus 3.1 2.5 - 4.6 mg/dL    Comment: Performed at [facility], [place] [address], [place], [place] [address]  Troponin I (High Sensitivity)     Status: Abnormal   Collection Time: 10/06/21  3:58 AM  Result Value Ref Range   Troponin I (High Sensitivity) 33 (H) <18 ng/L    Comment: (NOTE) Elevated high sensitivity troponin I (hsTnI) values and significant  changes across serial measurements may suggest ACS but many other  chronic and acute conditions are known to elevate hsTnI results.  Refer to the "Links" section for chest pain algorithms and additional  guidance. Performed at [facility], [place] [address] [place], [place] [address]   CBG monitoring, ED     Status:  Abnormal   Collection Time: 10/06/21  4:48 AM  Result Value Ref Range   Glucose-Capillary 177 (H) 70 - 99 mg/dL    Comment: Glucose reference range applies only to samples taken after fasting for at least 8 hours.  CBG monitoring, ED     Status: Abnormal   Collection Time: 10/06/21  6:21 AM  Result Value Ref Range   Glucose-Capillary 163 (H) 70 - 99 mg/dL    Comment: Glucose reference range applies only to samples taken after fasting for at least 8 hours.  CBG monitoring, ED     Status: Abnormal   Collection Time: 10/06/21  8:04 AM  Result Value Ref Range   Glucose-Capillary 141 (H) 70 - 99 mg/dL    Comment: Glucose reference range applies only to samples taken after fasting for at least 8 hours.  Troponin I (High Sensitivity)     Status: Abnormal   Collection Time: 10/06/21 10:13 AM  Result Value Ref Range   Troponin I (High Sensitivity) 39 (H) <18 ng/L    Comment: (NOTE) Elevated high sensitivity troponin I (hsTnI) values and significant  changes across serial  measurements may suggest ACS but many other  chronic and acute conditions are known to elevate hsTnI results.  Refer to the "Links" section for chest pain algorithms and additional  guidance. Performed at [facility], [place] [address], [place], [place] [address]   Glucose, capillary     Status: Abnormal   Collection Time: 10/06/21 11:55 AM  Result Value Ref Range   Glucose-Capillary 193 (H) 70 - 99 mg/dL    Comment: Glucose reference range applies only to samples taken after fasting for at least 8 hours.  Troponin I (High Sensitivity)     Status: Abnormal   Collection Time: 10/06/21 12:49 PM  Result Value Ref Range   Troponin I (High Sensitivity) 33 (H) <18 ng/L    Comment: (NOTE) Elevated high sensitivity troponin I (hsTnI) values and significant  changes across serial measurements may suggest ACS but many other  chronic and acute conditions are known to elevate hsTnI results.  Refer to the "Links"  section for chest pain algorithms and additional  guidance. Performed at [facility], [place] [address], [place], [place] [address]   Glucose, capillary     Status: Abnormal   Collection Time: 10/06/21  3:53 PM  Result Value Ref Range   Glucose-Capillary 185 (H) 70 - 99 mg/dL    Comment: Glucose reference range applies only to samples taken after fasting for at least 8 hours.  Glucose, capillary     Status: Abnormal   Collection Time: 10/06/21  8:00 PM  Result Value Ref Range   Glucose-Capillary 152 (H) 70 - 99 mg/dL    Comment: Glucose reference range applies only to samples taken after fasting for at least 8 hours.  Glucose, capillary     Status: Abnormal   Collection Time: 10/07/21 12:11 AM  Result Value Ref Range   Glucose-Capillary 157 (H) 70 - 99 mg/dL    Comment: Glucose reference range applies only to samples taken after fasting for at least 8 hours.  Glucose, capillary     Status: Abnormal   Collection Time: 10/07/21  4:21 AM  Result Value Ref Range   Glucose-Capillary 117 (H) 70 - 99 mg/dL    Comment: Glucose reference range applies only to samples taken after fasting for at least 8 hours.  Glucose, capillary     Status: Abnormal   Collection Time: 10/07/21  7:48 AM  Result Value Ref Range   Glucose-Capillary 148 (H) 70 - 99 mg/dL    Comment: Glucose reference range applies only to samples taken after fasting for at least 8 hours.  Glucose, capillary     Status: Abnormal   Collection Time: 10/07/21 11:28 AM  Result Value Ref Range   Glucose-Capillary 255 (H) 70 - 99 mg/dL    Comment: Glucose reference range applies only to samples taken after fasting for at least 8 hours.    Studies/Results: DG UGI W SINGLE CM (SOL OR THIN BA)  Result Date: 10/07/2021 CLINICAL DATA:  Patient with complaint of nausea, vomiting, gagging when eating and weight loss. Request for upper GI study. EXAM: DG UGI W SINGLE CM TECHNIQUE: Scout radiograph was obtained. Single  contrast examination was performed using thin liquid barium. This exam was performed by Narda Rutherford, NP, and was supervised and interpreted by Fabiola Backer, MD. FLUOROSCOPY: Radiation Exposure Index (as provided by the fluoroscopic device): 20.30 mGy Kerma COMPARISON:  CT abdomen pelvis dated 10/05/2021. FINDINGS: Limited examination due to patient's inability to stand or reposition on fluoroscopy table.  Scout Radiograph: Normal bowel gas pattern.  Prior cholecystectomy. Esophagus: Smooth fixed narrowing in the distal esophagus just proximal to the GE junction, measuring 7 mm in diameter. Esophageal motility: Tertiary contractions in the mid and distal esophagus. Gastroesophageal reflux: A small amount of reflux into the distal esophagus occurred spontaneously. Ingested 42m barium tablet:  Not given. Stomach: Small hiatal hernia. Gastric emptying: Normal. Duodenum:  Normal appearance. Other:  None. IMPRESSION: 1. Limited study. 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 2. Mild gastroesophageal reflux.  Small hiatal hernia. 3. Age related esophageal dysmotility. Read by: SNarda Rutherford AGNP-BC Electronically Signed   By: WTitus DubinM.D.   On: 10/07/2021 09:25   CT ABDOMEN PELVIS WO CONTRAST  Result Date: 10/05/2021 CLINICAL DATA:  Nausea and vomiting. Constipation. Generalized weakness. EXAM: CT ABDOMEN AND PELVIS WITHOUT CONTRAST TECHNIQUE: Multidetector CT imaging of the abdomen and pelvis was performed following the standard protocol without IV contrast. RADIATION DOSE REDUCTION: This exam was performed according to the departmental dose-optimization program which includes automated exposure control, adjustment of the mA and/or kV according to patient size and/or use of iterative reconstruction technique. COMPARISON:  08/05/2013. FINDINGS: Lower chest: The heart is normal in size and pacemaker leads are noted in the right heart. Mild  atelectasis or scarring is present at the lung bases. Hepatobiliary: No focal liver abnormality is seen. Status post cholecystectomy. No biliary dilatation. Pancreas: Pancreas is atrophic. There is a hypodensity in the tail of the pancreas with cystic attenuation measuring 1.2 cm. No pancreatic ductal dilatation. Spleen: Normal in size without focal abnormality. Adrenals/Urinary Tract: Adrenal glands are unremarkable. Kidneys are normal, without renal calculi, focal lesion, or hydronephrosis. Bladder is unremarkable. Stomach/Bowel: Small hiatal hernia is present. Stomach is otherwise within normal limits. Appendix is not seen. No evidence of acute bowel wall thickening, distention, or inflammatory changes. No free air or pneumatosis. A moderate amount of retained stool is present in the colon. Vascular/Lymphatic: Aortic atherosclerosis. No enlarged abdominal or pelvic lymph nodes. Reproductive: Status post hysterectomy. No adnexal masses. Other: No abdominopelvic ascites. Pelvic floor laxity is noted and unchanged from the previous exam. Musculoskeletal: Degenerative changes in the thoracolumbar spine. No acute osseous abnormality. IMPRESSION: 1. No acute intra-abdominal process. 2. Moderate amount of retained stool in the colon, compatible with history of constipation. 3. Small hiatal hernia. 4. 1.2 cm cystic structure in the tail of the pancreas. Two year follow-up is recommended for surveillance. 5. Aortic atherosclerosis. Electronically Signed   By: LBrett FairyM.D.   On: 10/05/2021 20:48    Medications: I have reviewed the patient's current medications.  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 Ideally Plavix needs to be on hold for 5 days for EGD with balloon dilation, however since patient is admitted, will perform EGD as an inpatient, likely on Sunday. Patient is on famotidine. We will start pantoprazole 40 mg once a day(as outpatient lansoprazole was stopped because of nausea and vomiting).  ARonnette Juniper MD 10/07/2021, 11:59 AM

## 2021-10-07 NOTE — Progress Notes (Signed)
[facility] Progress Note  Patient: Tara Jackson     ONG:[id number]  DOA: 10/05/2021   PCP: Shon Baton, MD       Brief hospital course:   This this is an 81 year old female with dementia, who is bedbound/wheelchair-bound for little over a month, essential hypertension, diabetes mellitus type 2, CKD stage IIIb, chronic diastolic heart failure, morbid obesity, sick sinus syndrome status post pacemaker who presents to the hospital for chest pain.  Troponin were low normal. On my eval after discussion with the patient's husband it seems that the patient has had persistent problems since she was last discharged from here in July.  Currently she was admitted at that time for possible acute gastroenteritis and was on full liquids when she was discharged back to home.  According to the patient's husband, she is not able to tolerate food for few weeks now.  He states that she develops vomiting as soon as she tries to sit up.  She has been tolerating some liquids but not much and has not tolerated solid food at all.  She is sleeping all the time and has become increasingly weak.  He notes that she has had weight loss as well but cannot really comment of fibromyalgia. Due to the patient's severe fatigue, the patient's husband states that some of her medications were discontinued after a video visit with her primary care physician on 8/18.  These are as follows: He esomeprazole, guaifenesin, Vesicare and Symbicort.  He also states that the patient hardly ever takes her nortriptyline.   Subjective:  No vomiting so far. Not much oral intake.Chest pain still present  Assessment and Plan: Principal Problem:   Chest pain -This appears to be musculoskeletal-exam and is in the left upper chest-Voltaren gel has been ordered  Active Problems:    Vomiting -  with weight loss- inability to tolerate solids and barely drinking liquids - GI consult has been requested -upper GI series shows a dilated  esophagus- EGD planned for Sat after holding Eliquis -H. pylori IgG elevated at 1.44  Lethargy - Her persistent sleepiness is likely secondary to poor oral intake - improved after IVF    Chronic kidney disease, stage 3b (HCC) -GFR is in in the low 30s    Diabetes mellitus type 2 in obese (HCC) Currently on a low-dose sliding scale -Lately, husband has been giving her Glucotrol, NPH and Novolin altogether 1 time around 10:00 when he states she has her 1 meal-holding these meds for now  Dementia - Continue Aricept  Anxiety - Continue clonazepam and BuSpar    Hypothyroidism, unspecified -Synthroid    Cardiac pacemaker in situ  Obstructive sleep apnea - She is supposed to use oxygen at night   DVT prophylaxis:  enoxaparin (LOVENOX) injection 40 mg Start: 10/06/21 1200     Code Status: Full Code  Consultants: GI Level of Care: Level of care: Telemetry Disposition Plan:  Status is: Observation The patient will require care spanning > 2 midnights and should be moved to inpatient because: Poor oral intake, requires IV fluids  Objective:   Vitals:   10/06/21 2300 10/07/21 0444 10/07/21 0924 10/07/21 1313  BP:  (!) 97/58 119/70 128/79  Pulse: 70 100 (!) 118 61  Resp:   16 16  Temp:  98 F (36.7 C) (!) 97.5 F (36.4 C) (!) 97.5 F (36.4 C)  TempSrc:  Oral Oral Oral  SpO2:  99% 97% 100%  Weight:      Height:  Filed Weights   10/06/21 1027  Weight: 96 kg   Exam: General exam: Appears comfortable  HEENT: PERRLA, oral mucosa moist, no sclera icterus or thrush Respiratory system: Clear to auscultation. Respiratory effort normal. Musculoskeletal: Tender in left chest wall Cardiovascular system: S1 & S2 heard, regular rate and rhythm Gastrointestinal system: Abdomen soft, non-tender, nondistended. Normal bowel sounds   Central nervous system: Alert and oriented. No focal neurological deficits. Extremities: No cyanosis, clubbing or edema Skin: No rashes or  ulcers Psychiatry:  Mood & affect appropriate.    Imaging and lab data was personally reviewed    CBC: Recent Labs  Lab 10/05/21 2054 10/06/21 0358  WBC 10.2 8.5  NEUTROABS 7.7  --   HGB 12.5 12.9  HCT 39.8 41.9  MCV 100.3* 101.2*  PLT 254 536    Basic Metabolic Panel: Recent Labs  Lab 10/05/21 2054 10/06/21 0358 10/07/21 1115  NA 137 139 140  K 4.2 4.3 4.7  CL 104 104 105  CO2 22 21* 21*  GLUCOSE 189* 181* 224*  BUN '23 22 20  '$ CREATININE 1.59* 1.54* 1.52*  CALCIUM 9.5 9.8 9.1  MG 1.7 1.9  --   PHOS  --  3.1  --     GFR: Estimated Creatinine Clearance: 33.7 mL/min (A) (by C-G formula based on SCr of 1.52 mg/dL (H)).  Scheduled Meds:  busPIRone  10 mg Oral q morning   clonazepam  0.25 mg Oral BID   diclofenac Sodium  2 g Topical TID   donepezil  10 mg Oral QHS   enoxaparin (LOVENOX) injection  40 mg Subcutaneous Q24H   famotidine  20 mg Oral Daily   insulin aspart  0-9 Units Subcutaneous Q4H   levothyroxine  100 mcg Oral Q0600   mometasone-formoterol  2 puff Inhalation BID   pantoprazole  40 mg Oral Daily   rosuvastatin  20 mg Oral Daily   Continuous Infusions:  sodium chloride 125 mL/hr at 10/07/21 1653     LOS: 1 day   Author: Debbe Odea  10/07/2021 5:33 PM

## 2021-10-07 NOTE — Evaluation (Signed)
Occupational Therapy Evaluation Patient Details Name: Tara Jackson MRN: [id number] DOB: 04-Mar-1940 Today's Date: 10/07/2021   History of Present Illness 81 year old female with dementia, who is bedbound/wheelchair-bound for little over a month, essential hypertension, diabetes mellitus type 2, CKD stage IIIb, chronic diastolic heart failure, morbid obesity, sick sinus syndrome status post pacemaker and admitted to the hospital 10/05/21 for chest pain (appears to be musculoskeletal) and nausea/vomiting.   Clinical Impression   [name] is an 81 year old woman who presents with above medical history. On evaluation she transfers with assistance to [facility] and wheelchair and has assistance at bed level or EOB for all ADLs.  Today she presents close to her baseline but refused attempt to stand. Will need to follow acutely in order for patient to maintain baseline level of ability in order to return home with husband. Asked husband if a hospital bed would be useful but he politely declined.       Recommendations for follow up therapy are one component of a multi-disciplinary discharge planning process, led by the attending physician.  Recommendations may be updated based on patient status, additional functional criteria and insurance authorization.   Follow Up Recommendations  Home health OT    Assistance Recommended at Discharge Frequent or constant Supervision/Assistance  Patient can return home with the following A little help with bathing/dressing/bathroom;A lot of help with bathing/dressing/bathroom;Direct supervision/assist for financial management;Assistance with cooking/housework;Direct supervision/assist for medications management    Functional Status Assessment  Patient has had a recent decline in their functional status and/or demonstrates limited ability to make significant improvements in function in a reasonable and predictable amount of time  Equipment Recommendations  None  recommended by OT    Recommendations for Other Services       Precautions / Restrictions Precautions Precautions: Fall Restrictions Weight Bearing Restrictions: No      Mobility Bed Mobility Overal bed mobility: Needs Assistance Bed Mobility: Supine to Sit, Sit to Supine     Supine to sit: Supervision, HOB elevated Sit to supine: Max assist   General bed mobility comments: assist for controlling trunk and bringing LEs onto bed, assist for repositioning in bed    Transfers                   General transfer comment: pt declined attempting with RW, also declined trying Stedy despite being shown device, fearful of falling      Balance Overall balance assessment: Needs assistance Sitting-balance support: No upper extremity supported, Feet supported Sitting balance-Leahy Scale: Fair                                     ADL either performed or assessed with clinical judgement   ADL Overall ADL's : Needs assistance/impaired Eating/Feeding: Independent   Grooming: Set up   Upper Body Bathing: Set up   Lower Body Bathing: Maximal assistance   Upper Body Dressing : Set up;Sitting   Lower Body Dressing: Maximal assistance;Bed level     Toilet Transfer Details (indicate cue type and reason): refused mobilityi Toileting- Clothing Manipulation and Hygiene: Total assistance;Bed level               Vision Patient Visual Report: No change from baseline       Perception     Praxis      Pertinent Vitals/Pain Pain Assessment Pain Assessment: No/denies pain     Hand Dominance  Right   Extremity/Trunk Assessment Upper Extremity Assessment Upper Extremity Assessment: Overall WFL for tasks assessed   Lower Extremity Assessment Lower Extremity Assessment: Defer to PT evaluation RLE Sensation: history of peripheral neuropathy LLE Sensation: history of peripheral neuropathy   Cervical / Trunk Assessment Cervical / Trunk Assessment:  Normal   Communication Communication Communication: No difficulties   Cognition Arousal/Alertness: Awake/alert Behavior During Therapy: WFL for tasks assessed/performed Overall Cognitive Status: History of cognitive impairments - at baseline                                 General Comments: fearful of falling, hx of dementia     General Comments       Exercises     Shoulder Instructions      Home Living Family/patient expects to be discharged to:: Private residence Living Arrangements: Spouse/significant other Available Help at Discharge: Family;Available 24 hours/day Type of Home: House Home Access: Ramped entrance     Home Layout: One level     Bathroom Shower/Tub: Producer, television/film/video: Handicapped height Bathroom Accessibility: Yes   Home Equipment: Art gallery manager;Wheelchair - Forensic psychologist (2 wheels)   Additional Comments: uses hover round at home      Prior Functioning/Environment Prior Level of Function : Needs assist       Physical Assist : Mobility (physical) Mobility (physical): Bed mobility;Transfers;Gait   Mobility Comments: uses hoverround at home, has lift chair, amb very short distances into bathroom with husband's assist - prior to admission in July; currently requiring mod-max assist for transfers to w/c per spouse ADLs Comments: spouse assists - patient assists with UB and ADLs as able in bed.        OT Problem List: Decreased strength;Decreased activity tolerance;Impaired balance (sitting and/or standing);Decreased knowledge of use of DME or AE;Obesity      OT Treatment/Interventions: Self-care/ADL training;DME and/or AE instruction;Therapeutic activities;Balance training;Patient/family education    OT Goals(Current goals can be found in the care plan section) Acute Rehab OT Goals Patient Stated Goal: to go home at discharge OT Goal Formulation: With family Time For Goal Achievement:  10/21/21 Potential to Achieve Goals: Fair  OT Frequency: Min 2X/week    Co-evaluation PT/OT/SLP Co-Evaluation/Treatment: Yes (coeval) Reason for Co-Treatment: For Academic librarian;To address functional/ADL transfers PT goals addressed during session: Mobility/safety with mobility OT goals addressed during session: ADL's and self-care      AM-PAC OT "6 Clicks" Daily Activity     Outcome Measure Help from another person eating meals?: None Help from another person taking care of personal grooming?: A Little Help from another person toileting, which includes using toliet, bedpan, or urinal?: Total Help from another person bathing (including washing, rinsing, drying)?: A Lot Help from another person to put on and taking off regular upper body clothing?: A Little Help from another person to put on and taking off regular lower body clothing?: A Lot 6 Click Score: 15   End of Session Nurse Communication: Mobility status  Activity Tolerance: Patient tolerated treatment well Patient left: in bed;with call bell/phone within reach;with bed alarm set  OT Visit Diagnosis: Muscle weakness (generalized) (M62.81);Unsteadiness on feet (R26.81)                Time: 9629-5284 OT Time Calculation (min): 24 min Charges:  OT General Charges $OT Visit: 1 Visit OT Evaluation $OT Eval Low Complexity: 1 Low  Ninel Abdella, OTR/L Acute Care Rehab Services  Office [phone]   Kelli Churn 10/07/2021, 4:35 PM

## 2021-10-07 NOTE — Progress Notes (Signed)
Inpatient Diabetes Program Recommendations  AACE/ADA: New Consensus Statement on Inpatient Glycemic Control (2015)  Target Ranges:  Prepandial:   less than 140 mg/dL      Peak postprandial:   less than 180 mg/dL (1-2 hours)      Critically ill patients:  140 - 180 mg/dL   Lab Results  Component Value Date   GLUCAP 255 (H) 10/07/2021   HGBA1C 7.8 (H) 10/06/2021    Review of Glycemic Control  Latest Reference Range & Units 10/06/21 15:53 10/06/21 20:00 10/07/21 00:11 10/07/21 04:21 10/07/21 07:48 10/07/21 11:28  Glucose-Capillary 70 - 99 mg/dL 185 (H) 152 (H) 157 (H) 117 (H) 148 (H) 255 (H)   Diabetes history: DM 2 Outpatient Diabetes medications:  Glucotrol 20 mg bid AM (morning)NPH SSI if CBG>300 mg/dL give 20 units and If CBG<300 mg/dL give 15 units PM (evening) NPH- If CBG>250 mg/dL give 5 units and if CBG <250 give no insulin Novolin R (0-30 units tid with meals) Current orders for Inpatient glycemic control:  Novolog 0-9 units q 4 hours  Inpatient Diabetes Program Recommendations:    Consider adding Levemir 10 units in the AM and 5 units q PM.  Also once eating consistently, may consider changing Novolog correction to tid with meals and HS.  May need Novolog meal coverage as well?    Thanks,   Adah Perl, RN, BC-ADM Inpatient Diabetes Coordinator Pager [phone]  (8a-5p)

## 2021-10-07 NOTE — TOC Initial Note (Deleted)
Transition of Care [facility]) - Initial/Assessment Note    Patient Details  Name: Tara Jackson MRN: [id number] Date of Birth: 1941-01-04  Transition of Care [facility]) CM/SW Contact:    Leeroy Cha, RN Phone Number: 10/07/2021, 7:28 AM  Clinical Narrative:                  Transition of Care [facility]) Screening Note   Patient Details  Name: Tara Jackson Date of Birth: 01-31-1941   Transition of Care [facility]) CM/SW Contact:    Leeroy Cha, RN Phone Number: 10/07/2021, 7:28 AM    Transition of Care Department [facility]) has reviewed patient and no TOC needs have been identified at this time. We will continue to monitor patient advancement through interdisciplinary progression rounds. If new patient transition needs arise, please place a TOC consult.    Expected Discharge Plan: Home/Self Care Barriers to Discharge: Continued Medical Work up   Patient Goals and CMS Choice Patient states their goals for this hospitalization and ongoing recovery are:: wife states that the goal is to return home CMS Medicare.gov Compare Post Acute Care list provided to:: Patient Represenative (must comment) (wife) Choice offered to / list presented to : Spouse  Expected Discharge Plan and Services Expected Discharge Plan: Home/Self Care   Discharge Planning Services: CM Consult   Living arrangements for the past 2 months: Single Family Home                                      Prior Living Arrangements/Services Living arrangements for the past 2 months: Single Family Home Lives with:: Spouse Patient language and need for interpreter reviewed:: Yes              Criminal Activity/Legal Involvement Pertinent to Current Situation/Hospitalization: No - Comment as needed  Activities of Daily Living Home Assistive Devices/Equipment: Electric scooter ADL Screening (condition at time of admission) Patient's cognitive ability adequate to safely complete daily activities?: No Is the  patient deaf or have difficulty hearing?: No Does the patient have difficulty seeing, even when wearing glasses/contacts?: No Does the patient have difficulty concentrating, remembering, or making decisions?: Yes Patient able to express need for assistance with ADLs?: Yes Does the patient have difficulty dressing or bathing?: Yes Independently performs ADLs?: No Communication: Independent Dressing (OT): Needs assistance Is this a change from baseline?: Pre-admission baseline Grooming: Needs assistance Is this a change from baseline?: Pre-admission baseline Feeding: Independent Bathing: Needs assistance Is this a change from baseline?: Pre-admission baseline Toileting: Needs assistance Is this a change from baseline?: Pre-admission baseline In/Out Bed: Needs assistance Is this a change from baseline?: Pre-admission baseline Walks in Home: Dependent Is this a change from baseline?: Pre-admission baseline Does the patient have difficulty walking or climbing stairs?: No Weakness of Legs: Both Weakness of Arms/Hands: Both  Permission Sought/Granted                  Emotional Assessment Appearance:: Appears stated age Attitude/Demeanor/Rapport: Gracious Affect (typically observed): Calm Orientation: : Oriented to Self, Oriented to Place, Oriented to Situation Alcohol / Substance Use: Never Used Psych Involvement: No (comment)  Admission diagnosis:  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] Elevated troponin [R77.8] Chest pain [R07.9] Nausea and vomiting, unspecified vomiting type [R11.2] Intractable vomiting with nausea [R11.2] Patient Active Problem List   Diagnosis Date Noted   Vomiting 10/06/2021   Acute kidney injury superimposed on chronic kidney disease ([place])  08/14/2021   Acute gastroenteritis 08/14/2021   Hypothyroidism 08/14/2021   Dyslipidemia 08/14/2021   Hypothyroidism, unspecified 08/14/2021   Fall at home, initial encounter 08/14/2021   Lower extremity weakness 04/09/2020    Dry mouth 08/12/2019   Chronic respiratory failure with hypoxia ([place]) 07/26/2018   Cough variant asthma vs uacs  07/25/2018   Syncope due to orthostatic hypotension 09/12/2017   Orthostatic dizziness 06/12/2017   Diabetic neuropathy ([place]) 12/13/2016   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   Acute renal failure superimposed on chronic kidney disease ([place]) 10/24/2016   Neurocardiogenic syncope 10/23/2016   Chronic anemia 10/11/2016   Cardiac pacemaker in situ 04/26/2015   Chronic daily headache 04/07/2015   Symptomatic bradycardia 03/24/2015   Sick sinus syndrome s/p PPM 03/24/2015   Left ventricular diastolic dysfunction    Diabetes mellitus type 2 in obese ([place])    Normal coronary arteries 01/14/2015   Normal cardiac stress test 11/30/2014   Bradycardia 11/28/2014   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 [facility]) 11/28/2014   Morbid obesity ([place]) 11/28/2014   Chronic kidney disease, stage 3b ([place]) 11/28/2014   Memory loss 06/03/2014   AKI (acute kidney injury) ([place])    Chest pain 04/19/2014   Pleuritic chest pain 04/19/2014   Diabetes mellitus ([place]) 04/19/2014   Venous stasis of both lower extremities with edema 04/19/2014   Dyspnea    DOE (dyspnea on exertion)    Tremor of right hand 09/20/2013   Headache(784.0) 09/19/2013   Restless legs syndrome (RLS) 09/19/2013   Constipation due to pain medication 08/21/2013   Ankle edema 08/21/2013   PVC's (premature ventricular contractions) 74/82/7078   Diastolic dysfunction, left ventricle    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   PULMONARY EMBOLISM, HX OF 10/02/2009   Rhinitis 04/07/2009   OSA (obstructive sleep apnea) 07/09/2008   Hyperlipidemia with target LDL less than 130 05/20/2008   Essential hypertension 05/20/2008   PCP:  Shon Baton, MD Pharmacy:   [id number] [place], [place] - [address] [place]Phone:  [phone]: [phone] [address] [place] [address]Phone: [phone]: [address] [id number]: [phone]: [phone]    Social Determinants of Health (SDOH) Interventions    Readmission Risk Interventions   Row Labels 08/16/2021   12:41 PM  Readmission Risk Prevention Plan   Section Header. No data exists in this row.   Transportation Screening   Complete  PCP or Specialist Appt within 5-7 Days   Complete  Home Care Screening   Complete  Medication Review (RN CM)   Complete

## 2021-10-07 NOTE — Evaluation (Signed)
Physical Therapy Evaluation Patient Details Name: Tara Jackson MRN: [id number] DOB: [birth year] Today's Date: 10/07/2021  History of Present Illness  81 year old female with dementia, who is bedbound/wheelchair-bound for little over a month, essential hypertension, diabetes mellitus type 2, CKD stage IIIb, chronic diastolic heart failure, morbid obesity, sick sinus syndrome status post pacemaker and admitted to the hospital 10/05/21 for chest pain (appears to be musculoskeletal) and nausea/vomiting.  Clinical Impression  Pt admitted with above diagnosis.  Pt currently with functional limitations due to the deficits listed below (see PT Problem List). Pt will benefit from skilled PT to increase their independence and safety with mobility to allow discharge to the venue listed below.  Pt responding with variable answers to questions and spouse present to provide previous level of function.  Spouse has been assisting pt with transfers OOB and states she requires "75%" assist.  HHPT for the past 2 weeks however they have only been doing sitting EOB and exercises in bed.  Today, pt fearful of falling and declined assist to stand with +2 and/or use of Stedy.  Spouse plans to bring pt home and continue to assist.  Spouse declines hospital bed at this time.          Recommendations for follow up therapy are one component of a multi-disciplinary discharge planning process, led by the attending physician.  Recommendations may be updated based on patient status, additional functional criteria and insurance authorization.  Follow Up Recommendations Home health PT      Assistance Recommended at Discharge Frequent or constant Supervision/Assistance  Patient can return home with the following  A lot of help with walking and/or transfers;A lot of help with bathing/dressing/bathroom;Help with stairs or ramp for entrance    Equipment Recommendations None recommended by PT  Recommendations for Other Services        Functional Status Assessment Patient has had a recent decline in their functional status and demonstrates the ability to make significant improvements in function in a reasonable and predictable amount of time.     Precautions / Restrictions Precautions Precautions: Fall      Mobility  Bed Mobility Overal bed mobility: Needs Assistance Bed Mobility: Supine to Sit, Sit to Supine     Supine to sit: Supervision, HOB elevated Sit to supine: Max assist   General bed mobility comments: assist for controlling trunk and bringing LEs onto bed, assist for repositioning in bed    Transfers                   General transfer comment: pt declined attempting with RW, also declined trying Stedy despite being shown device, fearful of falling    Ambulation/Gait                  Stairs            Wheelchair Mobility    Modified Rankin (Stroke Patients Only)       Balance Overall balance assessment: History of Falls, Needs assistance Sitting-balance support: No upper extremity supported, Feet supported Sitting balance-Leahy Scale: Fair                                       Pertinent Vitals/Pain Pain Assessment Pain Assessment: PAINAD Breathing: normal Negative Vocalization: none Facial Expression: smiling or inexpressive Body Language: relaxed Consolability: no need to console PAINAD Score: 0    Home Living Family/patient expects to be  discharged to:: Private residence Living Arrangements: Spouse/significant other Available Help at Discharge: Family;Available 24 hours/day Type of Home: House Home Access: Ramped entrance       Home Layout: One level Home Equipment: Transport planner;Wheelchair - Publishing copy (2 wheels) Additional Comments: uses hover round at home    Prior Function Prior Level of Function : Needs assist       Physical Assist : Mobility (physical) Mobility (physical): Bed mobility;Transfers;Gait    Mobility Comments: uses hoverround at home, has lift chair, amb very short distances into bathroom with husband's assist - prior to admission in July; currently requiring mod-max assist for transfers to w/c per spouse ADLs Comments: spouse assists     Hand Dominance        Extremity/Trunk Assessment        Lower Extremity Assessment Lower Extremity Assessment: Generalized weakness;RLE deficits/detail;LLE deficits/detail RLE Sensation: history of peripheral neuropathy LLE Sensation: history of peripheral neuropathy       Communication      Cognition Arousal/Alertness: Awake/alert Behavior During Therapy: WFL for tasks assessed/performed Overall Cognitive Status: History of cognitive impairments - at baseline                                 General Comments: fearful of falling, hx of dementia        General Comments      Exercises     Assessment/Plan    PT Assessment Patient needs continued PT services  PT Problem List Decreased strength;Decreased activity tolerance;Decreased balance;Decreased mobility;Decreased knowledge of use of DME;Obesity       PT Treatment Interventions DME instruction;Therapeutic exercise;Gait training;Balance training;Functional mobility training;Therapeutic activities;Patient/family education    PT Goals (Current goals can be found in the Care Plan section)  Acute Rehab PT Goals PT Goal Formulation: With patient/family Time For Goal Achievement: 10/21/21 Potential to Achieve Goals: Good    Frequency Min 2X/week     Co-evaluation PT/OT/SLP Co-Evaluation/Treatment: Yes Reason for Co-Treatment: For patient/therapist safety;To address functional/ADL transfers PT goals addressed during session: Mobility/safety with mobility OT goals addressed during session: ADL's and self-care       AM-PAC PT "6 Clicks" Mobility  Outcome Measure Help needed turning from your back to your side while in a flat bed without using  bedrails?: A Lot Help needed moving from lying on your back to sitting on the side of a flat bed without using bedrails?: A Lot Help needed moving to and from a bed to a chair (including a wheelchair)?: Total Help needed standing up from a chair using your arms (e.g., wheelchair or bedside chair)?: Total Help needed to walk in hospital room?: Total Help needed climbing 3-5 steps with a railing? : Total 6 Click Score: 8    End of Session   Activity Tolerance: Patient tolerated treatment well Patient left: in bed;with call bell/phone within reach;with family/visitor present   PT Visit Diagnosis: Muscle weakness (generalized) (M62.81);Other abnormalities of gait and mobility (R26.89)    Time: 1359-1420 PT Time Calculation (min) (ACUTE ONLY): 21 min   Charges:   PT Evaluation $PT Eval Low Complexity: 1 Low         Kati PT, DPT Physical Therapist Acute Rehabilitation Services Preferred contact method: Secure Chat Weekend Pager Only: [phone] Office: [place] 10/07/2021, 3:09 PM

## 2021-10-07 NOTE — TOC Initial Note (Addendum)
Transition of Care [facility]) - Initial/Assessment Note    Patient Details  Name: Tara Jackson MRN: [id number] Date of Birth: [birth year]  Transition of Care [name]) CM/SW Contact:    Leeroy Cha, RN Phone Number: 10/07/2021, 7:29 AM  Clinical Narrative:                 Patient with history of dementia is bed bound and wheelchair bound.  Has home health through [facility] per the last admission in July 2023. Has home o2 in place at 2l/min. Hx of cva and tia's. Husband is in attendance.  Expected Discharge Plan: Home/Self Care Barriers to Discharge: Continued Medical Work up   Patient Goals and CMS Choice Patient states their goals for this hospitalization and ongoing recovery are:: wife states that the goal is to return home CMS Medicare.gov Compare Post Acute Care list provided to:: Patient Represenative (must comment) (wife) Choice offered to / list presented to : Spouse  Expected Discharge Plan and Services Expected Discharge Plan: Home/Self Care   Discharge Planning Services: CM Consult   Living arrangements for the past 2 months: Single Family Home                                      Prior Living Arrangements/Services Living arrangements for the past 2 months: Single Family Home Lives with:: Spouse Patient language and need for interpreter reviewed:: Yes              Criminal Activity/Legal Involvement Pertinent to Current Situation/Hospitalization: No - Comment as needed  Activities of Daily Living Home Assistive Devices/Equipment: Electric scooter ADL Screening (condition at time of admission) Patient's cognitive ability adequate to safely complete daily activities?: No Is the patient deaf or have difficulty hearing?: No Does the patient have difficulty seeing, even when wearing glasses/contacts?: No Does the patient have difficulty concentrating, remembering, or making decisions?: Yes Patient able to express need for assistance with ADLs?: Yes Does  the patient have difficulty dressing or bathing?: Yes Independently performs ADLs?: No Communication: Independent Dressing (OT): Needs assistance Is this a change from baseline?: Pre-admission baseline Grooming: Needs assistance Is this a change from baseline?: Pre-admission baseline Feeding: Independent Bathing: Needs assistance Is this a change from baseline?: Pre-admission baseline Toileting: Needs assistance Is this a change from baseline?: Pre-admission baseline In/Out Bed: Needs assistance Is this a change from baseline?: Pre-admission baseline Walks in Home: Dependent Is this a change from baseline?: Pre-admission baseline Does the patient have difficulty walking or climbing stairs?: No Weakness of Legs: Both Weakness of Arms/Hands: Both  Permission Sought/Granted                  Emotional Assessment Appearance:: Appears stated age Attitude/Demeanor/Rapport: Gracious Affect (typically observed): Calm Orientation: : Oriented to Self, Oriented to Place, Oriented to Situation Alcohol / Substance Use: Never Used Psych Involvement: No (comment)  Admission diagnosis:  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] Elevated troponin [R77.8] Chest pain [R07.9] Nausea and vomiting, unspecified vomiting type [R11.2] Intractable vomiting with nausea [R11.2] Patient Active Problem List   Diagnosis Date Noted   Vomiting 10/06/2021   Acute kidney injury superimposed on chronic kidney disease ([place]) 08/14/2021   Acute gastroenteritis 08/14/2021   Hypothyroidism 08/14/2021   Dyslipidemia 08/14/2021   Hypothyroidism, unspecified 08/14/2021   Fall at home, initial encounter 08/14/2021   Lower extremity weakness 04/09/2020   Dry mouth 08/12/2019   Chronic respiratory failure with hypoxia (  [place]) 07/26/2018   Cough variant asthma vs uacs  07/25/2018   Syncope due to orthostatic hypotension 09/12/2017   Orthostatic dizziness 06/12/2017   Diabetic neuropathy ([place]) 12/13/2016   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[place]) 12/01/2016   Acute renal failure superimposed on chronic kidney disease ([place]) 10/24/2016   Neurocardiogenic syncope 10/23/2016   Chronic anemia 10/11/2016   Cardiac pacemaker in situ 04/26/2015   Chronic daily headache 04/07/2015   Symptomatic bradycardia 03/24/2015   Sick sinus syndrome s/p PPM 03/24/2015   Left ventricular diastolic dysfunction    Diabetes mellitus type 2 in obese ([place])    Normal coronary arteries 01/14/2015   Normal cardiac stress test 11/30/2014   Bradycardia 11/28/2014   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 [facility]) 11/28/2014   Morbid obesity ([place]) 11/28/2014   Chronic kidney disease, stage 3b ([place]) 11/28/2014   Memory loss 06/03/2014   AKI (acute kidney injury) ([place])    Chest pain 04/19/2014   Pleuritic chest pain 04/19/2014   Diabetes mellitus ([place]) 04/19/2014   Venous stasis of both lower extremities with edema 04/19/2014   Dyspnea    DOE (dyspnea on exertion)    Tremor of right hand 09/20/2013   Headache(784.0) 09/19/2013   Restless legs syndrome (RLS) 09/19/2013   Constipation due to pain medication 08/21/2013   Ankle edema 08/21/2013   PVC's (premature ventricular contractions) 93/73/4287   Diastolic dysfunction, left ventricle    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   PULMONARY EMBOLISM, HX OF 10/02/2009   Rhinitis 04/07/2009   OSA (obstructive sleep apnea) 07/09/2008   Hyperlipidemia with target LDL less than 130 05/20/2008   Essential hypertension 05/20/2008   PCP:  Shon Baton, MD Pharmacy:   [id number] [place], [place] - [address] [place]Phone: [phone]: [phone] [address] [place] [address]Phone: [phone]: [address] [place]Phone: [phone]: [phone]    Social Determinants of Health (SDOH) Interventions    Readmission Risk Interventions   Row Labels 08/16/2021   12:41 PM  Readmission Risk Prevention Plan   Section Header. No data exists in this row.   Transportation Screening   Complete  PCP or Specialist Appt within 5-7 Days   Complete  Home Care Screening   Complete  Medication Review (RN CM)   Complete

## 2021-10-08 DIAGNOSIS — R072 Precordial pain: Secondary | ICD-10-CM | POA: Diagnosis not present

## 2021-10-08 DIAGNOSIS — N1832 Chronic kidney disease, stage 3b: Secondary | ICD-10-CM | POA: Diagnosis not present

## 2021-10-08 DIAGNOSIS — Z950 Presence of cardiac pacemaker: Secondary | ICD-10-CM | POA: Diagnosis not present

## 2021-10-08 DIAGNOSIS — R112 Nausea with vomiting, unspecified: Secondary | ICD-10-CM | POA: Diagnosis not present

## 2021-10-08 LAB — GLUCOSE, CAPILLARY
Glucose-Capillary: 144 mg/dL — ABNORMAL HIGH (ref 70–99)
Glucose-Capillary: 150 mg/dL — ABNORMAL HIGH (ref 70–99)
Glucose-Capillary: 153 mg/dL — ABNORMAL HIGH (ref 70–99)
Glucose-Capillary: 155 mg/dL — ABNORMAL HIGH (ref 70–99)
Glucose-Capillary: 184 mg/dL — ABNORMAL HIGH (ref 70–99)

## 2021-10-08 LAB — BASIC METABOLIC PANEL
Anion gap: 7 (ref 5–15)
BUN: 15 mg/dL (ref 8–23)
CO2: 20 mmol/L — ABNORMAL LOW (ref 22–32)
Calcium: 8.5 mg/dL — ABNORMAL LOW (ref 8.9–10.3)
Chloride: 112 mmol/L — ABNORMAL HIGH (ref 98–111)
Creatinine, Ser: 1.3 mg/dL — ABNORMAL HIGH (ref 0.44–1.00)
GFR, Estimated: 41 mL/min — ABNORMAL LOW (ref >60)
Glucose, Bld: 202 mg/dL — ABNORMAL HIGH (ref 70–99)
Potassium: 4.2 mmol/L (ref 3.5–5.1)
Sodium: 139 mmol/L (ref 135–145)

## 2021-10-08 MED ORDER — INSULIN ASPART 100 UNIT/ML IJ SOLN
0.0000 [IU] | Freq: Three times a day (TID) | INTRAMUSCULAR | Status: DC
  Administered 2021-10-08 (×2): 2 [IU] via SUBCUTANEOUS
  Administered 2021-10-08: 1 [IU] via SUBCUTANEOUS
  Administered 2021-10-09: 3 [IU] via SUBCUTANEOUS
  Administered 2021-10-09: 2 [IU] via SUBCUTANEOUS
  Administered 2021-10-10: 5 [IU] via SUBCUTANEOUS
  Administered 2021-10-10: 3 [IU] via SUBCUTANEOUS
  Administered 2021-10-10 – 2021-10-11 (×3): 2 [IU] via SUBCUTANEOUS
  Administered 2021-10-11: 3 [IU] via SUBCUTANEOUS
  Administered 2021-10-12: 2 [IU] via SUBCUTANEOUS
  Administered 2021-10-12 (×2): 1 [IU] via SUBCUTANEOUS
  Administered 2021-10-13: 3 [IU] via SUBCUTANEOUS
  Administered 2021-10-13: 2 [IU] via SUBCUTANEOUS

## 2021-10-08 MED ORDER — FESOTERODINE FUMARATE ER 4 MG PO TB24
4.0000 mg | ORAL_TABLET | Freq: Every day | ORAL | Status: DC
  Administered 2021-10-08 – 2021-10-13 (×6): 4 mg via ORAL
  Filled 2021-10-08 (×6): qty 1

## 2021-10-08 NOTE — Progress Notes (Signed)
[facility] Progress Note  Patient: Tara Jackson     GGY:[id number]  DOA: 10/05/2021   PCP: Shon Baton, MD       Brief hospital course:   This this is an 81 year old female with dementia, who is bedbound/wheelchair-bound for little over a month, essential hypertension, diabetes mellitus type 2, CKD stage IIIb, chronic diastolic heart failure, morbid obesity, sick sinus syndrome status post pacemaker who presents to the hospital for chest pain.  Troponin were low normal. On my eval after discussion with the patient's husband it seems that the patient has had persistent problems since she was last discharged from here in July.  Currently she was admitted at that time for possible acute gastroenteritis and was on full liquids when she was discharged back to home.  According to the patient's husband, she is not able to tolerate food for few weeks now.  He states that she develops vomiting as soon as she tries to sit up.  She has been tolerating some liquids but not much and has not tolerated solid food at all.  She is sleeping all the time and has become increasingly weak.  He notes that she has had weight loss as well but cannot really comment of fibromyalgia. Due to the patient's severe fatigue, the patient's husband states that some of her medications were discontinued after a video visit with her primary care physician on 8/18.  These are as follows: He esomeprazole, guaifenesin, Vesicare and Symbicort.  He also states that the patient hardly ever takes her nortriptyline.   Subjective:  Evaluated this AM. Still not drinking much fluids. Still has ongoing left chest pain. No vomiting.   Assessment and Plan: Principal Problem:   Chest pain -This appears to be musculoskeletal-exam and is in the left upper chest-Voltaren gel has been ordered  Active Problems:    Vomiting -  with weight loss- inability to tolerate solids and barely drinking liquids - GI consult has been requested  -upper GI series shows a dilated esophagus - EGD planned for Sunday after holding Plavix -H. pylori IgG elevated at 1.44  Lethargy - Her persistent sleepiness is likely secondary to poor oral intake - improved after IVF    Chronic kidney disease, stage 3b (HCC) -GFR is in in the low 30s    Diabetes mellitus type 2 in obese (HCC) Currently on a low-dose sliding scale -Lately, husband has been giving her Glucotrol, NPH and Novolin altogether 1 time around 10:00 when he states she has her 1 meal-holding these meds for now  Dementia - Continue Aricept  Anxiety - Continue clonazepam and BuSpar    Hypothyroidism, unspecified -Synthroid    Cardiac pacemaker in situ  Obstructive sleep apnea - She is supposed to use oxygen at night   DVT prophylaxis:  enoxaparin (LOVENOX) injection 40 mg Start: 10/06/21 1200     Code Status: Full Code  Consultants: GI Level of Care: Level of care: Telemetry Disposition Plan:  Status is: inpatient  Objective:   Vitals:   10/07/21 1313 10/07/21 2024 10/08/21 0457 10/08/21 1206  BP: 128/79 (!) 146/90 (!) 156/132 104/61  Pulse: 61 65 (!) 109 62  Resp: '16 16 16 16  '$ Temp: (!) 97.5 F (36.4 C) 97.9 F (36.6 C) 97.9 F (36.6 C) 98 F (36.7 C)  TempSrc: Oral Oral  Oral  SpO2: 100% 100% 97% 100%  Weight:      Height:       Filed Weights   10/06/21 1027  Weight:  96 kg   Exam: General exam: Appears comfortable  HEENT: PERRLA, oral mucosa moist, no sclera icterus or thrush Respiratory system: Clear to auscultation. Respiratory effort normal. Cardiovascular system: S1 & S2 heard, regular rate and rhythm Gastrointestinal system: Abdomen soft, non-tender, nondistended. Normal bowel sounds   Central nervous system: Alert and oriented. No focal neurological deficits. Extremities: No cyanosis, clubbing or edema Skin: No rashes or ulcers Psychiatry:  Mood & affect appropriate.    Imaging and lab data was personally reviewed    CBC: Recent  Labs  Lab 10/05/21 2054 10/06/21 0358  WBC 10.2 8.5  NEUTROABS 7.7  --   HGB 12.5 12.9  HCT 39.8 41.9  MCV 100.3* 101.2*  PLT 254 633    Basic Metabolic Panel: Recent Labs  Lab 10/05/21 2054 10/06/21 0358 10/07/21 1115  NA 137 139 140  K 4.2 4.3 4.7  CL 104 104 105  CO2 22 21* 21*  GLUCOSE 189* 181* 224*  BUN '23 22 20  '$ CREATININE 1.59* 1.54* 1.52*  CALCIUM 9.5 9.8 9.1  MG 1.7 1.9  --   PHOS  --  3.1  --     GFR: Estimated Creatinine Clearance: 33.7 mL/min (A) (by C-G formula based on SCr of 1.52 mg/dL (H)).  Scheduled Meds:  busPIRone  10 mg Oral q morning   clonazepam  0.25 mg Oral BID   diclofenac Sodium  2 g Topical TID   donepezil  10 mg Oral QHS   enoxaparin (LOVENOX) injection  40 mg Subcutaneous Q24H   famotidine  20 mg Oral Daily   insulin aspart  0-9 Units Subcutaneous TID WC   levothyroxine  100 mcg Oral Q0600   mometasone-formoterol  2 puff Inhalation BID   pantoprazole  40 mg Oral Daily   rosuvastatin  20 mg Oral Daily   Continuous Infusions:  sodium chloride 125 mL/hr at 10/08/21 0903     LOS: 2 days   Author: Debbe Odea  10/08/2021 2:41 PM

## 2021-10-08 NOTE — Progress Notes (Signed)
[facility] Gastroenterology Progress Note  Tara Jackson 81 y.o. [birth year]  CC:  Nausea, weight loss, loss of appetite, gagging   Subjective: Patient seen and examined at bedside, husband in the room.  Patient states she has continued nausea though it is slightly improved today.  Tolerating full liquid diet.  Continues to complain of lower abdominal pain.  Also reporting some mild chest discomfort which she says has been ongoing since admission.  Denies vomiting but states she has some regurgitation of mucus.  ROS : Review of Systems  Constitutional:  Negative for chills and fever.  Gastrointestinal:  Positive for abdominal pain, constipation and nausea. Negative for blood in stool, diarrhea, heartburn, melena and vomiting.  Genitourinary:  Negative for hematuria.  Musculoskeletal:  Negative for falls.      Objective: Vital signs in last 24 hours: Vitals:   10/07/21 2024 10/08/21 0457  BP: (!) 146/90 (!) 156/132  Pulse: 65 (!) 109  Resp: 16 16  Temp: 97.9 F (36.6 C) 97.9 F (36.6 C)  SpO2: 100% 97%    Physical Exam:  General:  Alert, cooperative, no distress, appears stated age  Head:  Normocephalic, without obvious abnormality, atraumatic  Eyes:  Anicteric sclera, EOM's intact  Lungs:   Clear to auscultation bilaterally, respirations unlabored  Heart:  Regular rate and rhythm, S1, S2 normal  Abdomen:   Soft, mild epigastric tenderness and lower abdominal tenderness, bowel sounds active  Extremities: No deformity, 1+ bilateral LE edema       Lab Results: Recent Labs    10/05/21 2054 10/06/21 0358 10/07/21 1115  NA 137 139 140  K 4.2 4.3 4.7  CL 104 104 105  CO2 22 21* 21*  GLUCOSE 189* 181* 224*  BUN '23 22 20  '$ CREATININE 1.59* 1.54* 1.52*  CALCIUM 9.5 9.8 9.1  MG 1.7 1.9  --   PHOS  --  3.1  --    Recent Labs    10/05/21 2054  AST 20  ALT 14  ALKPHOS 69  BILITOT 1.2  PROT 6.6  ALBUMIN 3.1*   Recent Labs    10/05/21 2054 10/06/21 0358  WBC 10.2  8.5  NEUTROABS 7.7  --   HGB 12.5 12.9  HCT 39.8 41.9  MCV 100.3* 101.2*  PLT 254 259   No results for input(s): "LABPROT", "INR" in the last 72 hours.    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  Mild GERD.  Small hiatal hernia.  Age-related esophageal dysmotility.  -CT A/P 10/05/2021 shows no acute intra-abdominal process.  Moderate amount of retained stool in the colon compatible with history of constipation.  Small hiatal hernia.  1.2 cm cystic structure in the tail of the pancreas.  2-year follow-up recommended for surveillance.  - Last dose of Plavix 10/07/2021 AM  - Hemoglobin 12.9, no leukocytosis, platelets 259 - BUN 20, creatinine 1.52, GFR 34  Plan: Continue full liquid diet. Hold Plavix. Tentative plan at this time is for patient to have EGD with dilation on Sunday as inpatient. Continue Protonix 40 mg daily. [facility] will follow.  Angelique Holm PA-C 10/08/2021, 11:20 AM  Contact #  [phone]

## 2021-10-08 NOTE — Progress Notes (Addendum)
Central telemetry called about 1630 to report change in pt rhythm. RN assessed patient and monitor. Patient asymptomatic. RN noted pt HR was jumping back and forth from normal HR to a tachycardic HR. RN notified MD. 12-lead ECG done. MD ordered BMET to be drawn.  Will continue to monitor.

## 2021-10-09 DIAGNOSIS — Z950 Presence of cardiac pacemaker: Secondary | ICD-10-CM | POA: Diagnosis not present

## 2021-10-09 DIAGNOSIS — R072 Precordial pain: Secondary | ICD-10-CM | POA: Diagnosis not present

## 2021-10-09 DIAGNOSIS — R112 Nausea with vomiting, unspecified: Secondary | ICD-10-CM | POA: Diagnosis not present

## 2021-10-09 DIAGNOSIS — N1832 Chronic kidney disease, stage 3b: Secondary | ICD-10-CM | POA: Diagnosis not present

## 2021-10-09 DIAGNOSIS — R6889 Other general symptoms and signs: Secondary | ICD-10-CM

## 2021-10-09 LAB — GLUCOSE, CAPILLARY
Glucose-Capillary: 200 mg/dL — ABNORMAL HIGH (ref 70–99)
Glucose-Capillary: 203 mg/dL — ABNORMAL HIGH (ref 70–99)
Glucose-Capillary: 209 mg/dL — ABNORMAL HIGH (ref 70–99)

## 2021-10-09 LAB — RESP PANEL BY RT-PCR (FLU A&B, COVID) ARPGX2
Influenza A by PCR: NEGATIVE
Influenza B by PCR: NEGATIVE
SARS Coronavirus 2 by RT PCR: NEGATIVE

## 2021-10-09 MED ORDER — MENTHOL 3 MG MT LOZG
1.0000 | LOZENGE | OROMUCOSAL | Status: DC | PRN
  Administered 2021-10-09: 3 mg via ORAL
  Filled 2021-10-09: qty 9

## 2021-10-09 MED ORDER — LIP MEDEX EX OINT
1.0000 | TOPICAL_OINTMENT | CUTANEOUS | Status: DC | PRN
  Administered 2021-10-09: 1 via TOPICAL
  Filled 2021-10-09: qty 7

## 2021-10-09 NOTE — Progress Notes (Addendum)
[facility] Progress Note  Patient: Tara Jackson     HQI:[id number]  DOA: 10/05/2021   PCP: Shon Baton, MD       Brief hospital course:   This this is an 81 year old female with dementia, who is bedbound/wheelchair-bound for little over a month, essential hypertension, diabetes mellitus type 2, CKD stage IIIb, chronic diastolic heart failure, morbid obesity, sick sinus syndrome status post pacemaker who presents to the hospital for chest pain.  Troponin were low normal. On my eval after discussion with the patient's husband it seems that the patient has had persistent problems since she was last discharged from here in July.  Currently she was admitted at that time for possible acute gastroenteritis and was on full liquids when she was discharged back to home.  According to the patient's husband, she is not able to tolerate food for few weeks now.  He states that she develops vomiting as soon as she tries to sit up.  She has been tolerating some liquids but not much and has not tolerated solid food at all.  She is sleeping all the time and has become increasingly weak.  He notes that she has had weight loss as well but cannot really comment of fibromyalgia. Due to the patient's severe fatigue, the patient's husband states that some of her medications were discontinued after a video visit with her primary care physician on 8/18.  These are as follows: He esomeprazole, guaifenesin, Vesicare and Symbicort.  He also states that the patient hardly ever takes her nortriptyline.   Subjective:  I evaluated this AM.  The patient tells me that she has a cold today and has a sore throat and a runny nose.  She continues to have pain in her left chest wall.  No appetite.  Assessment and Plan: Principal Problem:   Chest pain -This appears to be musculoskeletal-exam and is in the left upper chest-Voltaren gel has been ordered-appears to be improving  Active Problems:    Vomiting -  with weight  loss- inability to tolerate solids and barely drinking liquids - GI consult has been requested -upper GI series shows a dilated esophagus - EGD planned now for 9/5 after holding Plavix -Protonix started by GI -H. pylori IgG elevated at 1.44  Sore throat/runny nose- Flu like symptoms - Occurring while in the hospital - We will check for COVID influenza and respiratory viruses  Lethargy - persistent sleepiness likely secondary to poor oral intake - improved after IVF    Chronic kidney disease, stage 3b (HCC) -GFR is in in the low 30s    Diabetes mellitus type 2 in obese (HCC) Currently on a low-dose sliding scale -Lately, husband has been giving her Glucotrol, NPH and Novolin altogether 1 time around 10:00 when he states she has her 1 meal-holding these meds for now  Dementia - Continue Aricept  Anxiety - Continue clonazepam and BuSpar    Hypothyroidism, unspecified -Synthroid    Cardiac pacemaker in situ  Obstructive sleep apnea - She is supposed to use oxygen at night which is ordered   DVT prophylaxis:  enoxaparin (LOVENOX) injection 40 mg Start: 10/06/21 1200     Code Status: Full Code  Consultants: GI Level of Care: Level of care: Telemetry Disposition Plan:  Status is: inpatient  Objective:   Vitals:   10/08/21 0457 10/08/21 1206 10/08/21 2027 10/09/21 0549  BP: (!) 156/132 104/61 (!) 142/71 107/66  Pulse: (!) 109 62 (!) 59 (!) 114  Resp: 16 16  20 19  Temp: 97.9 F (36.6 C) 98 F (36.7 C) 97.7 F (36.5 C)   TempSrc:  Oral Oral   SpO2: 97% 100% 100% 96%  Weight:      Height:       Filed Weights   10/06/21 1027  Weight: 96 kg   Exam: General exam: Appears comfortable  HEENT: PERRLA, oral mucosa moist, no sclera icterus or thrush Respiratory system: Clear to auscultation. Respiratory effort normal. Cardiovascular system: S1 & S2 heard, regular rate and rhythm Gastrointestinal system: Abdomen soft, non-tender, nondistended. Normal bowel sounds    Central nervous system: Alert and oriented. No focal neurological deficits. Extremities: No cyanosis, clubbing or edema Skin: No rashes or ulcers Psychiatry:  Mood & affect appropriate.    Imaging and lab data was personally reviewed    CBC: Recent Labs  Lab 10/05/21 2054 10/06/21 0358  WBC 10.2 8.5  NEUTROABS 7.7  --   HGB 12.5 12.9  HCT 39.8 41.9  MCV 100.3* 101.2*  PLT 254 973    Basic Metabolic Panel: Recent Labs  Lab 10/05/21 2054 10/06/21 0358 10/07/21 1115 10/08/21 1852  NA 137 139 140 139  K 4.2 4.3 4.7 4.2  CL 104 104 105 112*  CO2 22 21* 21* 20*  GLUCOSE 189* 181* 224* 202*  BUN '23 22 20 15  '$ CREATININE 1.59* 1.54* 1.52* 1.30*  CALCIUM 9.5 9.8 9.1 8.5*  MG 1.7 1.9  --   --   PHOS  --  3.1  --   --     GFR: Estimated Creatinine Clearance: 39.4 mL/min (A) (by C-G formula based on SCr of 1.3 mg/dL (H)).  Scheduled Meds:  busPIRone  10 mg Oral q morning   clonazepam  0.25 mg Oral BID   diclofenac Sodium  2 g Topical TID   donepezil  10 mg Oral QHS   enoxaparin (LOVENOX) injection  40 mg Subcutaneous Q24H   famotidine  20 mg Oral Daily   fesoterodine  4 mg Oral Daily   insulin aspart  0-9 Units Subcutaneous TID WC   levothyroxine  100 mcg Oral Q0600   mometasone-formoterol  2 puff Inhalation BID   pantoprazole  40 mg Oral Daily   rosuvastatin  20 mg Oral Daily   Continuous Infusions:  sodium chloride 125 mL/hr at 10/09/21 0634     LOS: 3 days   Author: Eunice Blase Tomeko Scoville  10/09/2021 1:00 PM

## 2021-10-09 NOTE — Progress Notes (Signed)
[facility] Gastroenterology Progress Note  Tara Jackson 81 y.o. Nov 15, 1940   Subjective: Minimal intake of full liquids per her husband. Patient demented and thinks I am the doctor that took care of her 63 years ago.  Objective: Vital signs: Vitals:   10/08/21 2027 10/09/21 0549  BP: (!) 142/71 107/66  Pulse: (!) 59 (!) 114  Resp: 20 19  Temp: 97.7 F (36.5 C)   SpO2: 100% 96%    Physical Exam: Gen: demented, elderly, obese, no acute distress  HEENT: anicteric sclera CV: RRR Chest: CTA B Abd: soft, nontender, nondistended, +BS Ext: no edema  Lab Results: Recent Labs    10/07/21 1115 10/08/21 1852  NA 140 139  K 4.7 4.2  CL 105 112*  CO2 21* 20*  GLUCOSE 224* 202*  BUN 20 15  CREATININE 1.52* 1.30*  CALCIUM 9.1 8.5*   No results for input(s): "AST", "ALT", "ALKPHOS", "BILITOT", "PROT", "ALBUMIN" in the last 72 hours. No results for input(s): "WBC", "NEUTROABS", "HGB", "HCT", "MCV", "PLT" in the last 72 hours.    Assessment/Plan: Feeding difficulties with question of distal esophageal stricture on UGIS. Age related esophageal dysmotility and dementia likely main issues with her eating difficulties. Last dose of Plavix on 10/07/21 and 5 days will by 10/12/21 so will hold off on EGD with possible dilation until then (scheduled for 1pm on 10/12/21). On droplet precautions awaiting respiratory panel results sent this morning. Continue full liquid diet. Supportive care.    [name] 10/09/2021, 12:17 PM  Questions please call [phone] Patient ID: Tara Jackson, female   DOB: 04-17-1940, 81 y.o.   MRN: [id number]

## 2021-10-09 NOTE — Plan of Care (Signed)
°  Problem: Activity: °Goal: Risk for activity intolerance will decrease °Outcome: Progressing °  °Problem: Elimination: °Goal: Will not experience complications related to urinary retention °Outcome: Progressing °  °Problem: Pain Managment: °Goal: General experience of comfort will improve °Outcome: Progressing °  °

## 2021-10-10 DIAGNOSIS — R072 Precordial pain: Secondary | ICD-10-CM | POA: Diagnosis not present

## 2021-10-10 DIAGNOSIS — Z950 Presence of cardiac pacemaker: Secondary | ICD-10-CM | POA: Diagnosis not present

## 2021-10-10 DIAGNOSIS — R112 Nausea with vomiting, unspecified: Secondary | ICD-10-CM | POA: Diagnosis not present

## 2021-10-10 DIAGNOSIS — N1832 Chronic kidney disease, stage 3b: Secondary | ICD-10-CM | POA: Diagnosis not present

## 2021-10-10 LAB — RESPIRATORY PANEL BY PCR

## 2021-10-10 LAB — GLUCOSE, CAPILLARY
Glucose-Capillary: 187 mg/dL — ABNORMAL HIGH (ref 70–99)
Glucose-Capillary: 270 mg/dL — ABNORMAL HIGH (ref 70–99)
Glucose-Capillary: 234 mg/dL — ABNORMAL HIGH (ref 70–99)
Glucose-Capillary: 170 mg/dL — ABNORMAL HIGH (ref 70–99)

## 2021-10-10 MED ORDER — ENSURE ENLIVE PO LIQD
237.0000 mL | Freq: Three times a day (TID) | ORAL | Status: DC
  Administered 2021-10-10: 237 mL via ORAL

## 2021-10-10 NOTE — Progress Notes (Signed)
[facility] Progress Note  Patient: Tara Jackson     KGM:[id number]  DOA: 10/05/2021   PCP: Shon Baton, MD       Brief hospital course:   This this is an 81 year old female with dementia, who is bedbound/wheelchair-bound for little over a month, essential hypertension, diabetes mellitus type 2, CKD stage IIIb, chronic diastolic heart failure, morbid obesity, sick sinus syndrome status post pacemaker who presents to the hospital for chest pain.  Troponin were low normal. On my eval after discussion with the patient's husband it seems that the patient has had persistent problems since she was last discharged from here in July.  Currently she was admitted at that time for possible acute gastroenteritis and was on full liquids when she was discharged back to home.  According to the patient's husband, she is not able to tolerate food for few weeks now.  He states that she develops vomiting as soon as she tries to sit up.  She has been tolerating some liquids but not much and has not tolerated solid food at all.  She is sleeping all the time and has become increasingly weak.  He notes that she has had weight loss as well but cannot really comment of fibromyalgia. Due to the patient's severe fatigue, the patient's husband states that some of her medications were discontinued after a video visit with her primary care physician on 8/18.  These are as follows: He esomeprazole, guaifenesin, Vesicare and Symbicort.  He also states that the patient hardly ever takes her nortriptyline.   Subjective:  Still has runny nose. Sore throat not as bad.  Assessment and Plan: Principal Problem:   Chest pain -This appears to be musculoskeletal-exam and is in the left upper chest-Voltaren gel has been ordered-appears to be improving  Active Problems:    Vomiting -  with weight loss- inability to tolerate solids and barely drinking liquids - GI consult has been requested -upper GI series shows a dilated  esophagus - EGD planned now for 9/5 after holding Plavix -Protonix started by GI -H. pylori IgG elevated at 1.44 - I have discussed with the patient, her husband about increasing intake of fluids- have asked RN to document oral intake.   Sore throat/runny nose- Flu like symptoms - Occurring while in the hospital -  COVID influenza and respiratory virus panel negative  Lethargy - persistent sleepiness likely secondary to poor oral intake - improved after IVF    Chronic kidney disease, stage 3b (HCC) -GFR is in in the low 30s    Diabetes mellitus type 2 in obese (HCC) Currently on a low-dose sliding scale -Lately, husband has been giving her Glucotrol, NPH and Novolin altogether 1 time around 10:00 when he states she has her 1 meal-holding these meds for now  Dementia - Continue Aricept  Anxiety - Continue clonazepam and BuSpar    Hypothyroidism, unspecified -Synthroid    Cardiac pacemaker in situ  Obstructive sleep apnea - She is supposed to use oxygen at night which is ordered  Mostly bed bound - have encouraged her to get up and ambulate as much as possible with assistance- d/w RN as well   DVT prophylaxis:  enoxaparin (LOVENOX) injection 40 mg Start: 10/06/21 1200     Code Status: Full Code  Consultants: GI Level of Care: Level of care: Telemetry Disposition Plan:  Status is: inpatient  Objective:   Vitals:   10/09/21 1616 10/09/21 2132 10/09/21 2200 10/10/21 0643  BP: 90/72 (!) 118/59  128/79  Pulse: (!) 59 (!) 104  (!) 106  Resp: '16 18  20  '$ Temp: 98.4 F (36.9 C) 98.1 F (36.7 C)  98.3 F (36.8 C)  TempSrc: Oral Oral  Oral  SpO2: 97% 99% 99% 98%  Weight:      Height:       Filed Weights   10/06/21 1027  Weight: 96 kg   Exam: General exam: Appears comfortable  HEENT: oral mucosa moist Respiratory system: Clear to auscultation.  Cardiovascular system: S1 & S2 heard  Gastrointestinal system: Abdomen soft, non-tender, nondistended. Normal bowel  sounds   Extremities: No cyanosis, clubbing or edema Psychiatry:  Mood & affect appropriate.    Imaging and lab data was personally reviewed    CBC: Recent Labs  Lab 10/05/21 2054 10/06/21 0358  WBC 10.2 8.5  NEUTROABS 7.7  --   HGB 12.5 12.9  HCT 39.8 41.9  MCV 100.3* 101.2*  PLT 254 542    Basic Metabolic Panel: Recent Labs  Lab 10/05/21 2054 10/06/21 0358 10/07/21 1115 10/08/21 1852  NA 137 139 140 139  K 4.2 4.3 4.7 4.2  CL 104 104 105 112*  CO2 22 21* 21* 20*  GLUCOSE 189* 181* 224* 202*  BUN '23 22 20 15  '$ CREATININE 1.59* 1.54* 1.52* 1.30*  CALCIUM 9.5 9.8 9.1 8.5*  MG 1.7 1.9  --   --   PHOS  --  3.1  --   --     GFR: Estimated Creatinine Clearance: 39.4 mL/min (A) (by C-G formula based on SCr of 1.3 mg/dL (H)).  Scheduled Meds:  busPIRone  10 mg Oral q morning   clonazepam  0.25 mg Oral BID   diclofenac Sodium  2 g Topical TID   donepezil  10 mg Oral QHS   enoxaparin (LOVENOX) injection  40 mg Subcutaneous Q24H   famotidine  20 mg Oral Daily   feeding supplement  237 mL Oral TID BM   fesoterodine  4 mg Oral Daily   insulin aspart  0-9 Units Subcutaneous TID WC   levothyroxine  100 mcg Oral Q0600   mometasone-formoterol  2 puff Inhalation BID   pantoprazole  40 mg Oral Daily   rosuvastatin  20 mg Oral Daily   Continuous Infusions:  sodium chloride 75 mL/hr at 10/09/21 1628     LOS: 4 days   Author: Eunice Blase Bellamarie Pflug  10/10/2021 11:46 AM

## 2021-10-11 ENCOUNTER — Inpatient Hospital Stay (HOSPITAL_COMMUNITY): Payer: Medicare HMO

## 2021-10-11 DIAGNOSIS — R6889 Other general symptoms and signs: Secondary | ICD-10-CM | POA: Diagnosis not present

## 2021-10-11 DIAGNOSIS — R9431 Abnormal electrocardiogram [ECG] [EKG]: Secondary | ICD-10-CM | POA: Diagnosis not present

## 2021-10-11 DIAGNOSIS — R079 Chest pain, unspecified: Secondary | ICD-10-CM | POA: Diagnosis not present

## 2021-10-11 DIAGNOSIS — R112 Nausea with vomiting, unspecified: Secondary | ICD-10-CM | POA: Diagnosis not present

## 2021-10-11 DIAGNOSIS — R0789 Other chest pain: Secondary | ICD-10-CM | POA: Diagnosis not present

## 2021-10-11 DIAGNOSIS — N1832 Chronic kidney disease, stage 3b: Secondary | ICD-10-CM | POA: Diagnosis not present

## 2021-10-11 DIAGNOSIS — Z950 Presence of cardiac pacemaker: Secondary | ICD-10-CM | POA: Diagnosis not present

## 2021-10-11 DIAGNOSIS — R000 Tachycardia, unspecified: Secondary | ICD-10-CM

## 2021-10-11 LAB — GLUCOSE, CAPILLARY
Glucose-Capillary: 210 mg/dL — ABNORMAL HIGH (ref 70–99)
Glucose-Capillary: 213 mg/dL — ABNORMAL HIGH (ref 70–99)
Glucose-Capillary: 175 mg/dL — ABNORMAL HIGH (ref 70–99)

## 2021-10-11 LAB — ECHOCARDIOGRAM COMPLETE
Area-P 1/2: 4.6 cm2
Height: 67.5 in
S' Lateral: 2.85 cm
Weight: 3386.27 oz

## 2021-10-11 MED ORDER — BOOST / RESOURCE BREEZE PO LIQD CUSTOM
1.0000 | Freq: Three times a day (TID) | ORAL | Status: DC
Start: 2021-10-11 — End: 2021-10-13
  Administered 2021-10-11 – 2021-10-13 (×2): 1 via ORAL

## 2021-10-11 MED ORDER — PERFLUTREN LIPID MICROSPHERE
1.0000 mL | INTRAVENOUS | Status: AC | PRN
  Administered 2021-10-11: 2 mL via INTRAVENOUS

## 2021-10-11 NOTE — Progress Notes (Signed)
[facility] Progress Note  Tara Jackson 81 y.o. Jul 13, 1940   Subjective: Patient sitting in bedside chair with husband in room. Nursing staff in room. She is eating small amounts of full liquids. Cannot get a reliable history from patient due to her dementia. He denies vomiting.  Objective: Vital signs: Vitals:   10/10/21 2030 10/11/21 0624  BP: 100/65 110/70  Pulse: 60 (!) 103  Resp: 18 18  Temp: 98.1 F (36.7 C) 97.9 F (36.6 C)  SpO2: 97% 99%    Physical Exam: Gen: lethargic, elderly, demented, obese, no acute distress, pleasant HEENT: anicteric sclera CV: RRR Chest: CTA B Abd: soft, nontender, nondistended, +BS Ext: no edema  Lab Results: Recent Labs    10/08/21 1852  NA 139  K 4.2  CL 112*  CO2 20*  GLUCOSE 202*  BUN 15  CREATININE 1.30*  CALCIUM 8.5*   No results for input(s): "AST", "ALT", "ALKPHOS", "BILITOT", "PROT", "ALBUMIN" in the last 72 hours. No results for input(s): "WBC", "NEUTROABS", "HGB", "HCT", "MCV", "PLT" in the last 72 hours.    Assessment/Plan: Feeding difficulties - doubt she has an obstructing esophageal stricture and suspect her dementia and age-related dysmotility is causing her trouble with eating. However, needs an EGD with possible dilation to evaluate further and tomorrow is 5 days off the Plavix. NPO p MN. Cardiology planning interrogation of her pacer tomorrow. EGD with possible dilation planned for 10/12/21 at 1 pm by Dr. Alessandra Jackson.   Tara Jackson 10/11/2021, 12:40 PM  Questions please call [phone] Patient ID: Tara Jackson, female   DOB: Dec 17, 1940, 80 y.o.   MRN: [id number]

## 2021-10-11 NOTE — Progress Notes (Signed)
  Echocardiogram 2D Echocardiogram has been performed.  Tara Jackson 10/11/2021, 1:43 PM

## 2021-10-11 NOTE — Care Management Important Message (Signed)
Important Message  Patient Details IM Letter placed in Patient room for Family Name: Tara Jackson MRN: [id number] Date of Birth: [birth year]   Medicare Important Message Given:  Yes     Kerin Salen 10/11/2021, 12:07 PM

## 2021-10-11 NOTE — Progress Notes (Signed)
Physical Therapy Treatment Patient Details Name: Tara Jackson MRN: [id number] DOB: 02-Feb-1941 Today's Date: 10/11/2021   History of Present Illness 81 year old female with dementia, who is bedbound/wheelchair-bound for little over a month, essential hypertension, diabetes mellitus type 2, CKD stage IIIb, chronic diastolic heart failure, morbid obesity, sick sinus syndrome status post pacemaker and admitted to the hospital 10/05/21 for chest pain (appears to be musculoskeletal) and nausea/vomiting.    PT Comments    Pt in bed with Spouse at bedside.  Pt AxO x 3 requiring MAX encouragement to get OOB.  Pt wants to go home but has yet to get OOB. General bed mobility comments: assist for controlling trunk and bringing LEs onto bed, assist for repositioning in bed plus increased time.  General transfer comment: attempted 3 times to perform sit to stand however pt was unable to to clear hips off bed.  Forth attempt asissted with scooting drop arm recliner + Max Asisst.  Spouse present during session.  Stated prior pt was able to get in/out of a car. Lift pad under pt in recliner for Nursing staff to use to get pt back to bed.  Pt NOT functioning at prior transfer level for elder Spouse to handle.  Prior to admit, pt was able transfer at [facility] bed/Lift Chair/scooter Pt will need ST Rehab at SNF to address mobility and functional decline prior to safely returning home.   Recommendations for follow up therapy are one component of a multi-disciplinary discharge planning process, led by the attending physician.  Recommendations may be updated based on patient status, additional functional criteria and insurance authorization.  Follow Up Recommendations  Skilled nursing-short term rehab (<3 hours/day)     Assistance Recommended at Discharge Frequent or constant Supervision/Assistance  Patient can return home with the following A lot of help with walking and/or transfers;A lot of help with  bathing/dressing/bathroom;Help with stairs or ramp for entrance   Equipment Recommendations  None recommended by PT    Recommendations for Other Services       Precautions / Restrictions Precautions Precautions: Fall Precaution Comments: non amb, uses a scooter, has a lift chair Restrictions Weight Bearing Restrictions: No     Mobility  Bed Mobility Overal bed mobility: Needs Assistance Bed Mobility: Supine to Sit     Supine to sit: Mod assist     General bed mobility comments: assist for controlling trunk and bringing LEs onto bed, assist for repositioning in bed plus increased time    Transfers Overall transfer level: Needs assistance Equipment used: Rolling walker (2 wheels), 2 person hand held assist               General transfer comment: attempted 3 times to perform sit to stand however pt was unable to to clear hips off bed.  Forth attempt asissted with scooting drop arm recliner + Max Asisst.  Spouse present during session.  Stated prior pt was able to get in/out of a car.    Ambulation/Gait               General Gait Details: pt is non amb prior to admit   Stairs             Wheelchair Mobility    Modified Rankin (Stroke Patients Only)       Balance  Cognition Arousal/Alertness: Awake/alert Behavior During Therapy: WFL for tasks assessed/performed Overall Cognitive Status: History of cognitive impairments - at baseline                                 General Comments: AxO x 3 fearful of falling.  Hx Dementia.        Exercises      General Comments        Pertinent Vitals/Pain Pain Assessment Pain Assessment: Faces Faces Pain Scale: Hurts a little bit Pain Location: B LE Pain Descriptors / Indicators: Sore, Nagging, Tender, Throbbing Pain Intervention(s): Monitored during session, Repositioned    Home Living                           Prior Function            PT Goals (current goals can now be found in the care plan section) Progress towards PT goals: Progressing toward goals    Frequency    Min 2X/week      PT Plan Current plan remains appropriate    Co-evaluation              AM-PAC PT "6 Clicks" Mobility   Outcome Measure  Help needed turning from your back to your side while in a flat bed without using bedrails?: A Lot Help needed moving from lying on your back to sitting on the side of a flat bed without using bedrails?: A Lot Help needed moving to and from a bed to a chair (including a wheelchair)?: Total Help needed standing up from a chair using your arms (e.g., wheelchair or bedside chair)?: Total Help needed to walk in hospital room?: Total   6 Click Score: 7    End of Session Equipment Utilized During Treatment: Gait belt Activity Tolerance: Patient tolerated treatment well Patient left: in chair Nurse Communication: Mobility status;Need for lift equipment PT Visit Diagnosis: Muscle weakness (generalized) (M62.81);Other abnormalities of gait and mobility (R26.89)     Time: 3299-2426 PT Time Calculation (min) (ACUTE ONLY): 11 min  Charges:  $Therapeutic Activity: 8-22 mins                     Rica Koyanagi  PTA Acute  Rehabilitation Services Office M-F          [phone] Weekend pager [phone]

## 2021-10-11 NOTE — Plan of Care (Signed)
Patient has not gotten out of bed or ambulate since 8/31 when PT last evaluated her.

## 2021-10-11 NOTE — Consult Note (Addendum)
CONSULTATION NOTE   Patient Name: Tara Jackson Date of Encounter: 10/11/2021 Cardiologist: Glenetta Hew, MD Electrophysiologist: Cristopher Peru, MD Advanced Heart Failure: None   Chief Complaint   Consult for "tachycardia and pacer spikes"  Patient Profile   81 yo female with [facility] [employer] PPM placed for sinus node dysfunction, asked to see for tachycardia and abnormal pacer spikes on telemetry  HPI   Tara Jackson is a 81 y.o. female who is being seen today for the evaluation of tachy/pacer spikes at the request of DR. Rizwan. This is an 81 yo female with history of sinus node dysfunction s/p Chemical engineer pacer,cognitives dysfunction, falls, morbid obesity, FTT and and intermittent chest pain, improved with topical NSAIDs. Asked to see the patient for unexplained "tachycardia" and extra pacer spikes. Telemetry reviewed - appears to be appropriate pacing. There is significant artifact - the amplitude of pacer spikes varies, but that is not a function of the pacemaker device. There is no apparent loss of capture. The patient is tachycardic. The rhythm appears regular. She has had atypical chest wall pain, improved by topical NSAIDS. Troponin has been flat at 31, 33, 39 and 33. HR has been in the low 100's.   PMHx   Past Medical History:  Diagnosis Date   Anemia    Anxiety    Arthritis    Asthma    Brittle bone disease    CKD (chronic kidney disease)    Complication of anesthesia    hard to wake up after anesthesia, trouble turning head   Coronary artery disease, non-occlusive 2014   a. minimal CAD by cath in 2014 with 40% RI stenosis. b. low-risk NST in 11/2014.   Depression    Diabetes mellitus type 2, insulin dependent (HCC)    Diabetic neuropathy (HCC)    Diabetic retinopathy ([place])    NPDR OU   Diastolic dysfunction, left ventricle 11/30/14   EF 55%, grade 1 DD   DVT (deep venous thrombosis) ([place])    "18 in RLE; 7 LLE prior to PE" (03/24/2015)   Family  history of adverse reaction to anesthesia    " the whole family is hard to wake up"   Glaucoma    H/O hiatal hernia    Hyperlipidemia    Hypertension associated with diabetes ([place])    Hypertensive retinopathy    OU   Hypothyroidism    Kidney stones    "passed them"   Macular degeneration    Exu OU   Memory loss 06/03/2014   On home oxygen therapy    "2L oxygen concentrator @ night"   Osteoporosis    Oxygen desaturation during sleep    wears 2 liters of oxygen at night    Palpitations 35years ago   Cardionet monitor - revealed mostly normal sinus rhythm, sinus bradycardia with first-degree A-V block heart rates mostly in the 50s and 60s with some 70s. No arrhythmias, PVCs or PACs noted.   PE (pulmonary thromboembolism) ([place]) x 3, last one was ~ 2003   history of recurrent RLE DVT with PE - last PE ~>13 yrs; Maintained on Plavix   Pneumonia    Presence of permanent cardiac pacemaker    Restless legs syndrome    Sleep apnea    cpap disontinued; test done 07/14/2008 ordered per  [place]   Spinal headache    Spinal stenosis    L 3, L 4 and L 5, and C 1, C 2 and C3   Stroke (  [place]) 04-19-2013   tia and stroke. 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 s/p [employer] PPM in 03/2015 by Dr. Lovena Le secondary to sinus node dysfunction.    TIA (transient ischemic attack) March 2014    Past Surgical History:  Procedure Laterality Date   ABDOMINAL HYSTERECTOMY     APPENDECTOMY     BACK SURGERY     steroid inj   CATARACT EXTRACTION Bilateral    CATARACT EXTRACTION W/ INTRAOCULAR LENS  IMPLANT, BILATERAL Bilateral 2000s   CHOLECYSTECTOMY     EP IMPLANTABLE DEVICE N/A 03/24/2015   [facility] Scientific PPM, Dr. Lovena Le   ESOPHAGOGASTRODUODENOSCOPY  08/09/2011   Procedure: ESOPHAGOGASTRODUODENOSCOPY (EGD);  Surgeon: Jeryl Columbia, MD;  Location: [facility];  Service: Endoscopy;  Laterality: N/A;   ESOPHAGOGASTRODUODENOSCOPY (EGD) WITH PROPOFOL N/A 11/12/2013   Procedure:  ESOPHAGOGASTRODUODENOSCOPY (EGD) WITH PROPOFOL;  Surgeon: Jeryl Columbia, MD;  Location: [facility];  Service: Endoscopy;  Laterality: N/A;   ESOPHAGOGASTRODUODENOSCOPY (EGD) WITH PROPOFOL N/A 05/05/2016   Procedure: ESOPHAGOGASTRODUODENOSCOPY (EGD) WITH PROPOFOL;  Surgeon: Clarene Essex, MD;  Location: [facility] ENDOSCOPY;  Service: Endoscopy;  Laterality: N/A;   EYE SURGERY Bilateral    Cat Sx OU   GLAUCOMA SURGERY Bilateral 2000s   "laser"   HOT HEMOSTASIS  08/09/2011   Procedure: HOT HEMOSTASIS (ARGON PLASMA COAGULATION/BICAP);  Surgeon: Jeryl Columbia, MD;  Location: [facility];  Service: Endoscopy;  Laterality: N/A;   HOT HEMOSTASIS N/A 11/12/2013   Procedure: HOT HEMOSTASIS (ARGON PLASMA COAGULATION/BICAP);  Surgeon: Jeryl Columbia, MD;  Location: [facility];  Service: Endoscopy;  Laterality: N/A;   INSERT / REPLACE / REMOVE PACEMAKER  03/24/2015   IRIDOTOMY / IRIDECTOMY Bilateral    JOINT REPLACEMENT     KNEE SURGERY Left done with 2nd left knee replacement   spacer bar placement   LEFT HEART CATHETERIZATION WITH CORONARY ANGIOGRAM N/A 02/14/2012   Procedure: LEFT HEART CATHETERIZATION WITH CORONARY ANGIOGRAM;  Surgeon: Leonie Man, MD;  Location: [facility] CATH LAB;  Service: Cardiovascular:: no evidence of obstructive coronary disease ,low EDP with normal EF   LEV DOPPLER  05/29/2012   RIGHT EXTREM. NORMAL VENOUS DUPLEX   NM MYOCAR PERF WALL MOTION  08/2019   EF 55 to 65%.  No ischemia or infarction.  LOW RISK:   PACEMAKER PLACEMENT     REVISION TOTAL KNEE ARTHROPLASTY Left    TONSILLECTOMY     TOTAL KNEE ARTHROPLASTY Bilateral    TRANSTHORACIC ECHOCARDIOGRAM  11/2017   11/2017: EF 60-65%. Gr1 DD. PAP ~33 mmHg.  Mild TR. PPM leads in RA & RV.    TRIGGER FINGER RELEASE  11/22/2011   Procedure: RELEASE TRIGGER FINGER/A-1 PULLEY;  Surgeon: Meredith Pel, MD;  Location: [place];  Service: Orthopedics;  Laterality: Left;  Left trigger thumb release   TRIGGER FINGER RELEASE Right yrs ago    FAMHx    Family History  Problem Relation Age of Onset   Cancer Mother    Diabetes Mother    Cancer - Prostate Father    Diabetes Father    Retinal detachment Son    Diabetes Sister    Diabetes Brother    Diabetes Paternal Grandmother     SOCHx    reports that she has never smoked. She has been exposed to tobacco smoke. She has never used smokeless tobacco. She reports that she does not drink alcohol and does not use drugs.  Outpatient Medications   No current facility-administered medications on file  prior to encounter.   Current Outpatient Medications on File Prior to Encounter  Medication Sig Dispense Refill   acetaminophen (TYLENOL) 325 MG tablet Take 2 tablets (650 mg total) by mouth every 6 (six) hours as needed for up to 30 doses for mild pain or moderate pain. 30 tablet 0   albuterol (VENTOLIN HFA) 108 (90 Base) MCG/ACT inhaler INHALE 2 INHALATIONS 3 TIMES A DAY AS NEEDED FOR COUGH/WHEEZE (Patient taking differently: Inhale 2 puffs into the lungs 3 (three) times daily as needed for wheezing (cough).) 18 each PRN   busPIRone (BUSPAR) 10 MG tablet Take 1 tablet (10 mg total) by mouth every morning. 60 tablet 1   Cholecalciferol (VITAMIN D-3) 25 MCG (1000 UT) CAPS Take 3,000 Units by mouth every morning.     clonazePAM (KLONOPIN) 0.5 MG tablet Take 1 mg by mouth 2 (two) times daily.     clopidogrel (PLAVIX) 75 MG tablet Take 75 mg by mouth every morning.     dicyclomine (BENTYL) 20 MG tablet Take 40 mg by mouth in the morning and at bedtime. for cramping     donepezil (ARICEPT) 10 MG tablet TAKE 1 TABLET BY MOUTH EVERYDAY AT BEDTIME (Patient taking differently: Take 10 mg by mouth at bedtime.) 90 tablet 3   famotidine (PEPCID) 20 MG tablet Take 20 mg by mouth daily.     glipiZIDE (GLUCOTROL) 10 MG tablet Take 2 tablets (20 mg total) by mouth 2 (two) times daily before a meal. 60 tablet 1   guaiFENesin (MUCINEX) 600 MG 12 hr tablet Take 600 mg by mouth 2 (two) times daily as needed for  cough.     insulin NPH (HUMULIN N,NOVOLIN N) 100 UNIT/ML injection Inject 5-20 Units into the skin See admin instructions. In the morning:  If cbg >300, then give 20 units. If cbg < or = 300, then give 15 unit At night: If cbg >250, then give 5 units. If cbg < or = 250, then give no insulin     insulin regular (NOVOLIN R,HUMULIN R) 100 units/mL injection Inject 0-30 Units into the skin See admin instructions. At breakfast: if cbg < 300, then give 25 units. If cbg > or = 300, then give 30 units. After dinner: goal cbgs is 150 and give insulin dose to reach this. For every 100 cbg over 150, then give 10 units of insulin.     levothyroxine (SYNTHROID) 100 MCG tablet Take 100 mcg by mouth daily before breakfast.     lidocaine (LIDODERM) 5 % Place 2 patches onto the skin daily as needed (pain). Remove & Discard patch within 12 hours or as directed by MD     linaclotide (LINZESS) 290 MCG CAPS capsule Take 290 mcg by mouth daily as needed (for constipation).     Menthol-Methyl Salicylate (MUSCLE RUB) 10-15 % CREA Apply 1 application topically as needed for muscle pain.     nortriptyline (PAMELOR) 50 MG capsule Take 50 mg by mouth at bedtime.     pantoprazole (PROTONIX) 40 MG tablet Take 40 mg by mouth 2 (two) times daily.     potassium chloride SA (KLOR-CON M) 20 MEQ tablet Take 20 mEq by mouth 2 (two) times daily. Take 1/2 tablet twice daily.     rosuvastatin (CRESTOR) 20 MG tablet Take 20 mg by mouth daily.      solifenacin (VESICARE) 5 MG tablet Take 5 mg by mouth daily as needed (to prevent UTI).     terconazole (TERAZOL 7) 0.4 %  vaginal cream Place 1 applicator vaginally daily as needed for itching.     esomeprazole (NEXIUM) 40 MG capsule Take 40 mg by mouth daily. (Patient not taking: Reported on 08/14/2021)     OXYGEN Inhale 2 L into the lungs as needed. Used at overnight with a concentrator     SYMBICORT 80-4.5 MCG/ACT inhaler INHALE 2 PUFFS FIRST THING IN THE MORNING THEN ANOTHER 2 PUFFS ABOUT 12  HOURS LATER (Patient not taking: Reported on 08/14/2021) 30.6 each 4    Inpatient Medications    Scheduled Meds:  busPIRone  10 mg Oral q morning   clonazepam  0.25 mg Oral BID   diclofenac Sodium  2 g Topical TID   donepezil  10 mg Oral QHS   enoxaparin (LOVENOX) injection  40 mg Subcutaneous Q24H   famotidine  20 mg Oral Daily   feeding supplement  1 Container Oral TID BM   fesoterodine  4 mg Oral Daily   insulin aspart  0-9 Units Subcutaneous TID WC   levothyroxine  100 mcg Oral Q0600   mometasone-formoterol  2 puff Inhalation BID   pantoprazole  40 mg Oral Daily   rosuvastatin  20 mg Oral Daily    Continuous Infusions:  sodium chloride 75 mL/hr at 10/11/21 0748    PRN Meds: acetaminophen, lip balm, melatonin, menthol-cetylpyridinium, mouth rinse, prochlorperazine   ALLERGIES   Allergies  Allergen Reactions   Iodine Other (See Comments)    ONLY IV--Makes unconscious   Iohexol Other (See Comments)     Code: SOB, Desc: cardiac arrest w/ iv contrast, has never used 13 hr prep//alice calhoun, Onset Date: [id number]    Metoclopramide Hcl Other (See Comments)    suicidal   Sulfa Antibiotics Hives   Lactose Intolerance (Gi) Diarrhea   Lansoprazole Nausea And Vomiting    ROS   Review of systems not obtained due to patient factors.  Vitals   Vitals:   10/10/21 0643 10/10/21 1420 10/10/21 2030 10/11/21 0624  BP: 128/79 113/64 100/65 110/70  Pulse: (!) 106 (!) 109 60 (!) 103  Resp: '20 20 18 18  '$ Temp: 98.3 F (36.8 C) 98.6 F (37 C) 98.1 F (36.7 C) 97.9 F (36.6 C)  TempSrc: Oral Oral    SpO2: 98% 100% 97% 99%  Weight:      Height:        Intake/Output Summary (Last 24 hours) at 10/11/2021 1008 Last data filed at 10/11/2021 8527 Gross per 24 hour  Intake 4224.43 ml  Output 600 ml  Net 3624.43 ml   Filed Weights   10/06/21 1027  Weight: 96 kg    Physical Exam   General appearance: alert, no distress, and mildly obese Neck: no carotid bruit, no JVD,  thyroid not enlarged, symmetric, no tenderness/mass/nodules, and pacer in left upper chest, no seroma or bruising Lungs: clear to auscultation bilaterally Heart: regular rate and rhythm and tachycardic Abdomen: soft, non-tender; bowel sounds normal; no masses,  no organomegaly Extremities: extremities normal, atraumatic, no cyanosis or edema Pulses: 2+ and symmetric Skin: Skin color, texture, turgor normal. No rashes or lesions Neurologic: Grossly normal Psych: Pleasant  Labs   Results for orders placed or performed during the hospital encounter of 10/05/21 (from the past 48 hour(s))  Glucose, capillary     Status: Abnormal   Collection Time: 10/09/21 11:22 AM  Result Value Ref Range   Glucose-Capillary 203 (H) 70 - 99 mg/dL    Comment: Glucose reference range applies only to samples taken after  fasting for at least 8 hours.  Glucose, capillary     Status: Abnormal   Collection Time: 10/09/21  5:23 PM  Result Value Ref Range   Glucose-Capillary 209 (H) 70 - 99 mg/dL    Comment: Glucose reference range applies only to samples taken after fasting for at least 8 hours.  Glucose, capillary     Status: Abnormal   Collection Time: 10/10/21  7:44 AM  Result Value Ref Range   Glucose-Capillary 187 (H) 70 - 99 mg/dL    Comment: Glucose reference range applies only to samples taken after fasting for at least 8 hours.  Glucose, capillary     Status: Abnormal   Collection Time: 10/10/21 11:15 AM  Result Value Ref Range   Glucose-Capillary 270 (H) 70 - 99 mg/dL    Comment: Glucose reference range applies only to samples taken after fasting for at least 8 hours.  Glucose, capillary     Status: Abnormal   Collection Time: 10/10/21  4:28 PM  Result Value Ref Range   Glucose-Capillary 234 (H) 70 - 99 mg/dL    Comment: Glucose reference range applies only to samples taken after fasting for at least 8 hours.  Glucose, capillary     Status: Abnormal   Collection Time: 10/10/21 10:38 PM  Result  Value Ref Range   Glucose-Capillary 170 (H) 70 - 99 mg/dL    Comment: Glucose reference range applies only to samples taken after fasting for at least 8 hours.  Glucose, capillary     Status: Abnormal   Collection Time: 10/11/21  7:40 AM  Result Value Ref Range   Glucose-Capillary 210 (H) 70 - 99 mg/dL    Comment: Glucose reference range applies only to samples taken after fasting for at least 8 hours.    ECG   AV Paced rhythm at 61 (9/1) - Personally Reviewed  Telemetry   V-paced rhythm at 110 - Personally Reviewed  Radiology   No results found.  Cardiac Studies   N/A  Impression   Principal Problem:   Chest pain Active Problems:   Chronic kidney disease, stage 3b (HCC)   Diabetes mellitus type 2 in obese [facility])   Cardiac pacemaker in situ   Hypothyroidism, unspecified   Vomiting   Recommendation   She has concerns about left chest wall pain. Says pacer is "irritated" and "lifting up" - no seroma that I can detect. Husband says it "flips around" - she denies twiddling it. Currently v-pacing in the low 100's- was AV paced in the 60's on admission. ?arrhyhtmia or possible PMT. Receiving IVF's for possible dehydration, creatinine is improving. No s/s of HF, but will update echo given tachycardia. Will ask [employer] to interrogate pacer - not sure if this will be today or not with the holiday.  Time Spent Directly with Patient:  I have spent a total of 45 minutes with the patient reviewing hospital notes, telemetry, EKGs, labs and examining the patient as well as establishing an assessment and plan that was discussed personally with the patient.  > 50% of time was spent in direct patient care.  Length of Stay:  LOS: 5 days   Pixie Casino, MD, [facility], [place] Director of the Advanced Lipid Disorders &  Cardiovascular Risk Reduction Clinic Diplomate of the American Board of Clinical Lipidology Attending Cardiologist  Direct Dial:  [phone]  Fax: [phone]  Website:  [email or web address] Ahmya Bernick 10/11/2021, 10:08 AM

## 2021-10-11 NOTE — Progress Notes (Addendum)
[facility] Progress Note  Patient: Tara Jackson     DXA:[id number]  DOA: 10/05/2021   PCP: Shon Baton, MD       Brief hospital course:   This this is an 81 year old female with dementia, who is bedbound/wheelchair-bound for little over a month, essential hypertension, diabetes mellitus type 2, CKD stage IIIb, chronic diastolic heart failure, morbid obesity, sick sinus syndrome status post pacemaker who presents to the hospital for chest pain.  Troponin were low normal. On my eval after discussion with the patient's husband it seems that the patient has had persistent problems since she was last discharged from here in July.  Currently she was admitted at that time for possible acute gastroenteritis and was on full liquids when she was discharged back to home.  According to the patient's husband, she is not able to tolerate food for few weeks now.  He states that she develops vomiting as soon as she tries to sit up.  She has been tolerating some liquids but not much and has not tolerated solid food at all.  She is sleeping all the time and has become increasingly weak.  He notes that she has had weight loss as well but cannot really comment of fibromyalgia. Due to the patient's severe fatigue, the patient's husband states that some of her medications were discontinued after a video visit with her primary care physician on 8/18.  These are as follows: He esomeprazole, guaifenesin, Vesicare and Symbicort.  He also states that the patient hardly ever takes her nortriptyline.   Subjective:   The patient has no complaints of nausea of vomiting in the hospital.  Assessment and Plan: Principal Problem:   Chest pain -This appears to be musculoskeletal-exam and is in the left upper chest-Voltaren gel has been ordered-appears to be improving  Active Problems:    Vomiting -  with weight loss- inability to tolerate solids and barely drinking liquids - GI consult has been requested -upper  GI series shows a dilated esophagus - EGD planned now for 9/5 after holding Plavix -Protonix started by GI -H. pylori IgG elevated at 1.44 - I have discussed with the patient, her husband about increasing intake of fluids- have asked RN to document oral intake.  - she did not tolerate Ensure because it was too fatty- willing to try Breeze  Sore throat/runny nose- Flu like symptoms - Occurring while in the hospital -  COVID influenza and respiratory virus panel negative  Lethargy - persistent sleepiness likely secondary to poor oral intake - improved after IVF    Chronic kidney disease, stage 3b (HCC) -GFR is in in the low 30s    Diabetes mellitus type 2 in obese (HCC) Currently on a low-dose sliding scale -Lately, husband has been giving her Glucotrol, NPH and Novolin altogether 1 time around 10:00 when he states she has her 1 meal-holding these meds for now  Dementia - Continue Aricept  Anxiety - Continue clonazepam and BuSpar    Hypothyroidism, unspecified -Synthroid    Cardiac pacemaker in situ  Obstructive sleep apnea - She is supposed to use oxygen at night which is ordered  Mostly bed bound - have encouraged her to get up and ambulate as much as possible with assistance- has not been out of bed in days- last PT visit 8/31   DVT prophylaxis:  enoxaparin (LOVENOX) injection 40 mg Start: 10/06/21 1200    Code Status: Full Code  Consultants: GI Level of Care: Level of care: Telemetry Disposition  Plan:  Status is: inpatient  Objective:   Vitals:   10/10/21 0643 10/10/21 1420 10/10/21 2030 10/11/21 0624  BP: 128/79 113/64 100/65 110/70  Pulse: (!) 106 (!) 109 60 (!) 103  Resp: '20 20 18 18  '$ Temp: 98.3 F (36.8 C) 98.6 F (37 C) 98.1 F (36.7 C) 97.9 F (36.6 C)  TempSrc: Oral Oral    SpO2: 98% 100% 97% 99%  Weight:      Height:       Filed Weights   10/06/21 1027  Weight: 96 kg   Exam: General exam: Appears comfortable  HEENT: oral mucosa  moist Respiratory system: Clear to auscultation.  Cardiovascular system: S1 & S2 heard  Gastrointestinal system: Abdomen soft, non-tender, nondistended. Normal bowel sounds   Extremities: No cyanosis, clubbing or edema Psychiatry:  Mood & affect appropriate.    Imaging and lab data was personally reviewed    CBC: Recent Labs  Lab 10/05/21 2054 10/06/21 0358  WBC 10.2 8.5  NEUTROABS 7.7  --   HGB 12.5 12.9  HCT 39.8 41.9  MCV 100.3* 101.2*  PLT 254 865    Basic Metabolic Panel: Recent Labs  Lab 10/05/21 2054 10/06/21 0358 10/07/21 1115 10/08/21 1852  NA 137 139 140 139  K 4.2 4.3 4.7 4.2  CL 104 104 105 112*  CO2 22 21* 21* 20*  GLUCOSE 189* 181* 224* 202*  BUN '23 22 20 15  '$ CREATININE 1.59* 1.54* 1.52* 1.30*  CALCIUM 9.5 9.8 9.1 8.5*  MG 1.7 1.9  --   --   PHOS  --  3.1  --   --     GFR: Estimated Creatinine Clearance: 39.4 mL/min (A) (by C-G formula based on SCr of 1.3 mg/dL (H)).  Scheduled Meds:  busPIRone  10 mg Oral q morning   clonazepam  0.25 mg Oral BID   diclofenac Sodium  2 g Topical TID   donepezil  10 mg Oral QHS   enoxaparin (LOVENOX) injection  40 mg Subcutaneous Q24H   famotidine  20 mg Oral Daily   feeding supplement  1 Container Oral TID BM   fesoterodine  4 mg Oral Daily   insulin aspart  0-9 Units Subcutaneous TID WC   levothyroxine  100 mcg Oral Q0600   mometasone-formoterol  2 puff Inhalation BID   pantoprazole  40 mg Oral Daily   rosuvastatin  20 mg Oral Daily   Continuous Infusions:  sodium chloride 75 mL/hr at 10/11/21 0748     LOS: 5 days   Author: Eunice Blase Kristina Mcnorton  10/11/2021 12:54 PM

## 2021-10-11 NOTE — Progress Notes (Signed)
   Contacted by [employer] rep regarding pacer interrogation. The pacemaker is working appropriately, however, there is retrograde sensing causing PMT (pacemaker-mediated tachycardia). The device was reprogrammed by prolonging the PVARP and she is now appropriately pacing again the 60's. Echo personally reviewed shows incoordinate paced septal motion with preserved LVEF and mild RV systolic dysfunction with RA pressure of 8 mmHg.  Pixie Casino, MD, [facility], [place] Director of the Advanced Lipid Disorders &  Cardiovascular Risk Reduction Clinic Diplomate of the American Board of Clinical Lipidology Attending Cardiologist  Direct Dial: [phone]  Fax: [phone]  Website:  [email or web address]

## 2021-10-12 ENCOUNTER — Encounter (HOSPITAL_COMMUNITY)
Admission: EM | Disposition: A | Discharge status: Home Health | Payer: Self-pay | Source: Home / Self Care | Attending: Internal Medicine

## 2021-10-12 ENCOUNTER — Inpatient Hospital Stay (HOSPITAL_COMMUNITY): Payer: Medicare HMO | Admitting: Anesthesiology

## 2021-10-12 DIAGNOSIS — R112 Nausea with vomiting, unspecified: Secondary | ICD-10-CM | POA: Diagnosis not present

## 2021-10-12 DIAGNOSIS — I10 Essential (primary) hypertension: Secondary | ICD-10-CM

## 2021-10-12 DIAGNOSIS — J449 Chronic obstructive pulmonary disease, unspecified: Secondary | ICD-10-CM

## 2021-10-12 DIAGNOSIS — I499 Cardiac arrhythmia, unspecified: Secondary | ICD-10-CM | POA: Diagnosis not present

## 2021-10-12 DIAGNOSIS — Z794 Long term (current) use of insulin: Secondary | ICD-10-CM

## 2021-10-12 DIAGNOSIS — E039 Hypothyroidism, unspecified: Secondary | ICD-10-CM

## 2021-10-12 DIAGNOSIS — E1169 Type 2 diabetes mellitus with other specified complication: Secondary | ICD-10-CM | POA: Diagnosis not present

## 2021-10-12 DIAGNOSIS — R1310 Dysphagia, unspecified: Secondary | ICD-10-CM

## 2021-10-12 DIAGNOSIS — N1832 Chronic kidney disease, stage 3b: Secondary | ICD-10-CM | POA: Diagnosis not present

## 2021-10-12 DIAGNOSIS — E119 Type 2 diabetes mellitus without complications: Secondary | ICD-10-CM

## 2021-10-12 HISTORY — PX: ESOPHAGOGASTRODUODENOSCOPY (EGD) WITH PROPOFOL: SHX5813

## 2021-10-12 HISTORY — PX: BIOPSY: SHX5522

## 2021-10-12 HISTORY — PX: BOTOX INJECTION: SHX5754

## 2021-10-12 LAB — BASIC METABOLIC PANEL
Anion gap: 7 (ref 5–15)
BUN: 12 mg/dL (ref 8–23)
CO2: 21 mmol/L — ABNORMAL LOW (ref 22–32)
Calcium: 8.7 mg/dL — ABNORMAL LOW (ref 8.9–10.3)
Chloride: 111 mmol/L (ref 98–111)
Creatinine, Ser: 1.26 mg/dL — ABNORMAL HIGH (ref 0.44–1.00)
GFR, Estimated: 42 mL/min — ABNORMAL LOW (ref >60)
Glucose, Bld: 147 mg/dL — ABNORMAL HIGH (ref 70–99)
Potassium: 3.7 mmol/L (ref 3.5–5.1)
Sodium: 139 mmol/L (ref 135–145)

## 2021-10-12 LAB — GLUCOSE, CAPILLARY
Glucose-Capillary: 166 mg/dL — ABNORMAL HIGH (ref 70–99)
Glucose-Capillary: 154 mg/dL — ABNORMAL HIGH (ref 70–99)
Glucose-Capillary: 150 mg/dL — ABNORMAL HIGH (ref 70–99)
Glucose-Capillary: 137 mg/dL — ABNORMAL HIGH (ref 70–99)

## 2021-10-12 SURGERY — ESOPHAGOGASTRODUODENOSCOPY (EGD) WITH PROPOFOL
Anesthesia: Monitor Anesthesia Care

## 2021-10-12 MED ORDER — ONABOTULINUMTOXINA 100 UNITS IJ SOLR
INTRAMUSCULAR | Status: AC
  Filled 2021-10-12: qty 100

## 2021-10-12 MED ORDER — PROPOFOL 10 MG/ML IV BOLUS: INTRAVENOUS | Status: DC | PRN

## 2021-10-12 MED ORDER — PHENYLEPHRINE HCL-NACL 20-0.9 MG/250ML-% IV SOLN
INTRAVENOUS | Status: DC | PRN
  Administered 2021-10-12: 25 ug/min via INTRAVENOUS

## 2021-10-12 MED ORDER — LACTATED RINGERS IV SOLN: INTRAVENOUS | Status: DC | PRN

## 2021-10-12 MED ORDER — LIDOCAINE 2% (20 MG/ML) 5 ML SYRINGE
INTRAMUSCULAR | Status: DC | PRN
  Administered 2021-10-12: 40 mg via INTRAVENOUS

## 2021-10-12 MED ORDER — PROPOFOL 500 MG/50ML IV EMUL
INTRAVENOUS | Status: DC | PRN
  Administered 2021-10-12: 150 ug/kg/min via INTRAVENOUS

## 2021-10-12 MED ORDER — SODIUM CHLORIDE (PF) 0.9 % IJ SOLN
INTRAMUSCULAR | Status: AC
  Filled 2021-10-12: qty 10

## 2021-10-12 SURGICAL SUPPLY — 15 items

## 2021-10-12 NOTE — Anesthesia Preprocedure Evaluation (Addendum)
Anesthesia Evaluation  Patient identified by MRN, date of birth, ID band Patient awake    Reviewed: Allergy & Precautions, NPO status , Patient's Chart, lab work & pertinent test results  Airway Mallampati: II  TM Distance: >3 FB Neck ROM: Full    Dental no notable dental hx.    Pulmonary sleep apnea , COPD,  oxygen dependent,  2L at night   Pulmonary exam normal breath sounds clear to auscultation       Cardiovascular hypertension, + DVT  Normal cardiovascular exam+ pacemaker  Rhythm:Regular Rate:Normal     Neuro/Psych TIACVA negative psych ROS   GI/Hepatic Neg liver ROS, hiatal hernia,   Endo/Other  diabetes, Type 2, Insulin DependentHypothyroidism Morbid obesity  Renal/GU Renal InsufficiencyRenal disease  negative genitourinary   Musculoskeletal negative musculoskeletal ROS (+)   Abdominal   Peds negative pediatric ROS (+)  Hematology negative hematology ROS (+)   Anesthesia Other Findings   Reproductive/Obstetrics negative OB ROS                             Anesthesia Physical Anesthesia Plan  ASA: 3  Anesthesia Plan: MAC   Post-op Pain Management: Minimal or no pain anticipated   Induction: Intravenous  PONV Risk Score and Plan: 2 and Propofol infusion and Treatment may vary due to age or medical condition  Airway Management Planned: Simple Face Mask  Additional Equipment:   Intra-op Plan:   Post-operative Plan:   Informed Consent: I have reviewed the patients History and Physical, chart, labs and discussed the procedure including the risks, benefits and alternatives for the proposed anesthesia with the patient or authorized representative who has indicated his/her understanding and acceptance.     Dental advisory given  Plan Discussed with: CRNA and Surgeon  Anesthesia Plan Comments:         Anesthesia Quick Evaluation

## 2021-10-12 NOTE — Anesthesia Procedure Notes (Signed)
Procedure Name: MAC Date/Time: 10/12/2021 1:20 PM  Performed by: Cynda Familia, CRNAPre-anesthesia Checklist: Patient identified, Emergency Drugs available, Suction available, Patient being monitored and Timeout performed Patient Re-evaluated:Patient Re-evaluated prior to induction Oxygen Delivery Method: Simple face mask Placement Confirmation: positive ETCO2 and breath sounds checked- equal and bilateral Dental Injury: Teeth and Oropharynx as per pre-operative assessment  Comments: Bite block by tech

## 2021-10-12 NOTE — Progress Notes (Signed)
Rounding Note    Patient Name: Tara Jackson Date of Encounter: 10/12/2021  [place] Cardiologist: Glenetta Hew, MD   Subjective   Currently comfortable, husband at bedside.  She did point to a spot on her left chest where it was uncomfortable.  Reassurance given, musculoskeletal.  Inpatient Medications    Scheduled Meds:  busPIRone  10 mg Oral q morning   clonazepam  0.25 mg Oral BID   diclofenac Sodium  2 g Topical TID   donepezil  10 mg Oral QHS   enoxaparin (LOVENOX) injection  40 mg Subcutaneous Q24H   famotidine  20 mg Oral Daily   feeding supplement  1 Container Oral TID BM   fesoterodine  4 mg Oral Daily   insulin aspart  0-9 Units Subcutaneous TID WC   levothyroxine  100 mcg Oral Q0600   mometasone-formoterol  2 puff Inhalation BID   pantoprazole  40 mg Oral Daily   rosuvastatin  20 mg Oral Daily   Continuous Infusions:  sodium chloride 75 mL/hr at 10/11/21 2209   PRN Meds: acetaminophen, lip balm, melatonin, menthol-cetylpyridinium, mouth rinse, prochlorperazine   Vital Signs    Vitals:   10/11/21 1453 10/11/21 2100 10/12/21 0522 10/12/21 0900  BP: (!) 140/59 (!) 147/73 (!) 139/46 (!) 149/63  Pulse: 60 60 60 61  Resp: '20 17 16 17  '$ Temp: 97.9 F (36.6 C) (!) 97.4 F (36.3 C) 97.7 F (36.5 C) (!) 97.5 F (36.4 C)  TempSrc: Oral  Oral Oral  SpO2: 99% 100% 95% 99%  Weight:      Height:        Intake/Output Summary (Last 24 hours) at 10/12/2021 1122 Last data filed at 10/12/2021 0900 Gross per 24 hour  Intake 2086.37 ml  Output 600 ml  Net 1486.37 ml      10/06/2021   10:27 AM 08/15/2021    8:11 AM 08/13/2021   10:19 PM  Last 3 Weights  Weight (lbs) 211 lb 10.3 oz 230 lb 9.6 oz 227 lb 1.2 oz  Weight (kg) 96 kg 104.6 kg 103 kg      Telemetry    Ventricular pacing- Personally Reviewed  ECG    No new- Personally Reviewed  Physical Exam   GEN: No acute distress.   Neck: No JVD Cardiac: RRR, no murmurs, rubs, or gallops.   Respiratory: Clear to auscultation bilaterally. GI: Soft, nontender, non-distended  MS: No edema; No deformity. Neuro:  Nonfocal  Psych: Normal affect   Labs    High Sensitivity Troponin:   Recent Labs  Lab 10/05/21 2054 10/06/21 0358 10/06/21 1013 10/06/21 1249  TROPONINIHS 31* 33* 39* 33*     Chemistry Recent Labs  Lab 10/05/21 2054 10/06/21 0358 10/07/21 1115 10/08/21 1852  NA 137 139 140 139  K 4.2 4.3 4.7 4.2  CL 104 104 105 112*  CO2 22 21* 21* 20*  GLUCOSE 189* 181* 224* 202*  BUN '23 22 20 15  '$ CREATININE 1.59* 1.54* 1.52* 1.30*  CALCIUM 9.5 9.8 9.1 8.5*  MG 1.7 1.9  --   --   PROT 6.6  --   --   --   ALBUMIN 3.1*  --   --   --   AST 20  --   --   --   ALT 14  --   --   --   ALKPHOS 69  --   --   --   BILITOT 1.2  --   --   --  GFRNONAA 32* 33* 34* 41*  ANIONGAP '11 14 14 7    '$ Lipids No results for input(s): "CHOL", "TRIG", "HDL", "LABVLDL", "LDLCALC", "CHOLHDL" in the last 168 hours.  Hematology Recent Labs  Lab 10/05/21 2054 10/06/21 0358  WBC 10.2 8.5  RBC 3.97 4.14  HGB 12.5 12.9  HCT 39.8 41.9  MCV 100.3* 101.2*  MCH 31.5 31.2  MCHC 31.4 30.8  RDW 14.3 14.4  PLT 254 259   Thyroid  Recent Labs  Lab 10/05/21 2054  TSH 0.676  FREET4 1.51*    BNPNo results for input(s): "BNP", "PROBNP" in the last 168 hours.  DDimer No results for input(s): "DDIMER" in the last 168 hours.   Radiology    ECHOCARDIOGRAM COMPLETE  Result Date: 10/11/2021    ECHOCARDIOGRAM REPORT   Patient Name:   SHERALD BALBUENA Mcclimans Date of Exam: 10/11/2021 Medical Rec #:  [id number]       Height:       67.5 in Accession #:    [id number]      Weight:       211.6 lb Date of Birth:  08/19/1940       BSA:          2.083 m Patient Age:    81 years        BP:           110/70 mmHg Patient Gender: F               HR:           111 bpm. Exam Location:  Inpatient Procedure: 2D Echo, Cardiac Doppler, Color Doppler and Intracardiac            Opacification Agent Indications:    R94.31  Abnormal EKG; R07.9* Chest pain, unspecified.  History:        Patient has prior history of Echocardiogram examinations, most                 recent 10/26/2017. Abnormal ECG and Pacemaker,                 Arrythmias:Tachycardia, Signs/Symptoms:Shortness of Breath,                 Dyspnea, Altered Mental Status, Chest Pain and Syncope; Risk                 Factors:Diabetes, Dyslipidemia and Hypertension.  Sonographer:    Roseanna Rainbow RDCS Referring Phys: [phone] Nadean Corwin HILTY  Sonographer Comments: Technically difficult study due to poor echo windows and patient is obese. Image acquisition challenging due to patient body habitus. Difficult study. Patient supine, required lift to get back to bed, and was sensitive to pressure from probe. IMPRESSIONS  1. Left ventricular ejection fraction, by estimation, is 60 to 65%. The left ventricle has normal function. The left ventricle demonstrates regional wall motion abnormalities (see scoring diagram/findings for description). There is moderate left ventricular hypertrophy. Indeterminate diastolic filling due to E-A fusion.  2. Right ventricular systolic function is mildly reduced. The right ventricular size is normal. There is normal pulmonary artery systolic pressure. The estimated right ventricular systolic pressure is 14.4 mmHg.  3. The mitral valve is grossly normal. Trivial mitral valve regurgitation.  4. The aortic valve is tricuspid. Aortic valve regurgitation is not visualized.  5. The inferior vena cava is dilated in size with >50% respiratory variability, suggesting right atrial pressure of 8 mmHg.  6. Rhythm strip during this exam demonstrates paced tachycardia. Comparison(s): Changes from  prior study are noted. 10/26/2017: LVEF 60-65%, grade 1 DD, RVSP 33 mmHg. FINDINGS  Left Ventricle: Left ventricular ejection fraction, by estimation, is 60 to 65%. The left ventricle has normal function. The left ventricle demonstrates regional wall motion abnormalities. The left  ventricular internal cavity size was normal in size. There is moderate left ventricular hypertrophy. Abnormal (paradoxical) septal motion, consistent with RV pacemaker. Indeterminate diastolic filling due to E-A fusion. Right Ventricle: The right ventricular size is normal. No increase in right ventricular wall thickness. Right ventricular systolic function is mildly reduced. There is normal pulmonary artery systolic pressure. The tricuspid regurgitant velocity is 2.58 m/s, and with an assumed right atrial pressure of 8 mmHg, the estimated right ventricular systolic pressure is 23.5 mmHg. Left Atrium: Left atrial size was normal in size. Right Atrium: Right atrial size was normal in size. Pericardium: There is no evidence of pericardial effusion. Mitral Valve: The mitral valve is grossly normal. Trivial mitral valve regurgitation. Tricuspid Valve: The tricuspid valve is grossly normal. Tricuspid valve regurgitation is mild. Aortic Valve: The aortic valve is tricuspid. Aortic valve regurgitation is not visualized. Pulmonic Valve: The pulmonic valve was grossly normal. Pulmonic valve regurgitation is trivial. Aorta: The aortic root and ascending aorta are structurally normal, with no evidence of dilitation. Venous: The inferior vena cava is dilated in size with greater than 50% respiratory variability, suggesting right atrial pressure of 8 mmHg. IAS/Shunts: No atrial level shunt detected by color flow Doppler. EKG: Rhythm strip during this exam demonstrates paced tachycardia. Additional Comments: A device lead is visualized.  LEFT VENTRICLE PLAX 2D LVIDd:         3.45 cm   Diastology LVIDs:         2.85 cm   LV e' medial:    4.87 cm/s LV PW:         1.40 cm   LV E/e' medial:  13.6 LV IVS:        1.30 cm   LV e' lateral:   4.78 cm/s LVOT diam:     1.90 cm   LV E/e' lateral: 13.8 LV SV:         30 LV SV Index:   14 LVOT Area:     2.84 cm  RIGHT VENTRICLE            IVC RV S prime:     8.76 cm/s  IVC diam: 2.60 cm TAPSE  (M-mode): 1.7 cm LEFT ATRIUM             Index        RIGHT ATRIUM           Index LA diam:        3.75 cm 1.80 cm/m   RA Area:     10.90 cm LA Vol (A2C):   50.2 ml 24.10 ml/m  RA Volume:   25.00 ml  12.00 ml/m LA Vol (A4C):   34.9 ml 16.76 ml/m LA Biplane Vol: 43.2 ml 20.74 ml/m  AORTIC VALVE LVOT Vmax:   93.15 cm/s LVOT Vmean:  59.700 cm/s LVOT VTI:    0.106 m  AORTA Ao Root diam: 3.05 cm Ao Asc diam:  3.00 cm MITRAL VALVE               TRICUSPID VALVE MV Area (PHT): 4.60 cm    TR Peak grad:   26.6 mmHg MV Decel Time: 165 msec    TR Vmax:        258.00 cm/s MV  E velocity: 66.00 cm/s                            SHUNTS                            Systemic VTI:  0.11 m                            Systemic Diam: 1.90 cm Lyman Bishop MD Electronically signed by Lyman Bishop MD Signature Date/Time: 10/11/2021/2:16:31 PM    Final     Cardiac Studies   10/11/2021-echo   1. Left ventricular ejection fraction, by estimation, is 60 to 65%. The  left ventricle has normal function. The left ventricle demonstrates  regional wall motion abnormalities (see scoring diagram/findings for  description). There is moderate left  ventricular hypertrophy. Indeterminate diastolic filling due to E-A  fusion.   2. Right ventricular systolic function is mildly reduced. The right  ventricular size is normal. There is normal pulmonary artery systolic  pressure. The estimated right ventricular systolic pressure is 31.4 mmHg.   3. The mitral valve is grossly normal. Trivial mitral valve  regurgitation.   4. The aortic valve is tricuspid. Aortic valve regurgitation is not  visualized.   5. The inferior vena cava is dilated in size with >50% respiratory  variability, suggesting right atrial pressure of 8 mmHg.   6. Rhythm strip during this exam demonstrates paced tachycardia.   Comparison(s): Changes from prior study are noted. 10/26/2017: LVEF 60-65%,  grade 1 DD, RVSP 33 mmHg.   Patient Profile     81 y.o. female  with pacemaker mediated tachycardia  Assessment & Plan    Pacemaker mediated tachycardia-Dr. Bamberg was able to reprogram pacemaker and it is now functioning properly.  Previously there was unexplained tachycardia and extra pacer spikes.  Reassuring echo.  Elevated troponin - Flat low in the 30s.  Myocardial injury in the setting of tachycardia as well as chronic kidney disease stage IIIb  Chest discomfort - Musculoskeletal most likely.  Voltaren gel improved.  Could also be GI related with vomiting.  Vomiting - EGD per GI.  We will go ahead and sign off.  Please let us know if we can be of further assistance.   For questions or updates, please contact [place] Please consult [email or web address] for contact info under        Signed, Candee Furbish, MD  10/12/2021, 11:22 AM

## 2021-10-12 NOTE — Progress Notes (Signed)
[facility] Progress Note  Patient: Tara Jackson     SAY:[id number]  DOA: 10/05/2021   PCP: Shon Baton, MD       Brief hospital course:   This this is an 81 year old female with dementia, who is bedbound/wheelchair-bound for little over a month, essential hypertension, diabetes mellitus type 2, CKD stage IIIb, chronic diastolic heart failure, morbid obesity, sick sinus syndrome status post pacemaker who presents to the hospital for chest pain.  Troponin were low normal. On my eval after discussion with the patient's husband it seems that the patient has had persistent problems since she was last discharged from here in July.  Currently she was admitted at that time for possible acute gastroenteritis and was on full liquids when she was discharged back to home.  According to the patient's husband, she is not able to tolerate food for few weeks now.  He states that she develops vomiting as soon as she tries to sit up.  She has been tolerating some liquids but not much and has not tolerated solid food at all.  She is sleeping all the time and has become increasingly weak.  He notes that she has had weight loss as well but cannot really comment of fibromyalgia. Due to the patient's severe fatigue, the patient's husband states that some of her medications were discontinued after a video visit with her primary care physician on 8/18.  These are as follows: He esomeprazole, guaifenesin, Vesicare and Symbicort.  He also states that the patient hardly ever takes her nortriptyline.  Plavix had to be held for GI to perform an EGD which was performed today. See report below. Course complicated by abnormal pacer activity which I found incidentally on telemetry.    Subjective:  She states her abdomen hurts today. Per husband she had almost nothing to drink all day yesterday because she was concerned about having to not drink for the EGD today.   Assessment and Plan: Principal Problem:   Chest  pain -This appears to be musculoskeletal-exam and is in the left upper chest-Voltaren gel has been ordered-appears to be improving  Active Problems:    Vomiting -  with weight loss- inability to tolerate solids and barely drinking liquids (per husband) - GI consult has been requested -upper GI series shows a dilated esophagus - EGD suggestive of esophageal dysmotility- Botox given - also noted chronic atrophic gastritis -GI recommendations> Protonix to be continued daily and Plavix to be resumed tomorrow - regular diet ordered- will need to follow today to see how she tolerates it as she has had nothing but liquids for days and yesterday had almost no liquids- cont IVF for today -  she did not like the Breeze or the Ensure (too fatty) so unable give her supplements     Cardiac pacemaker in situ- Pacer malfunction - I requested a cardiology consult as she was V paced and tachycardic at about 115 - 120 - Pacer interrogated and reprogrammed  - HR has improved to 60s - d/w husband  Sore throat/runny nose- Flu like symptoms - Occurring while in the hospital -  COVID influenza and respiratory virus panel negative  Lethargy - persistent sleepiness likely secondary to poor oral intake - improved after IVF    Chronic kidney disease, stage 3b (HCC) -GFR is in in the low 30s    Diabetes mellitus type 2 in obese (HCC) - Currently only on a low-dose sliding scale  -Lately, husband has been giving her Glucotrol, NPH and  Novolin altogether 1 time around 10:00 when he states she has her 1 meal - last A1c on 8/30 was 7.8   CKD 3b - checking Cr intermittently   Dementia - Continue Aricept- has mild to moderate dementia  Anxiety - Continue clonazepam and BuSpar    Hypothyroidism, unspecified -Synthroid   Obstructive sleep apnea - She is supposed to use oxygen at night which is ordered  Mostly bed bound - have encouraged her to get up and ambulate as much as possible with assistance-  husband states he plans to take her home and will not let her go to a SNF   DVT prophylaxis:  enoxaparin (LOVENOX) injection 40 mg Start: 10/06/21 1200    Code Status: Full Code  Consultants: GI Level of Care: Level of care: Telemetry Disposition Plan:  Status is: inpatient  Objective:   Vitals:   10/12/21 1236 10/12/21 1349 10/12/21 1400 10/12/21 1410  BP: (!) 154/48 (!) 151/52 (!) 118/43 (!) 133/54  Pulse: 60 63 63 65  Resp: 18 (!) 21 (!) 21 20  Temp: 97.8 F (36.6 C) (!) 97.4 F (36.3 C)    TempSrc: Tympanic Temporal    SpO2: 96% 100% 94% 99%  Weight: 96 kg     Height: 5' 7.5" (1.715 m)      Filed Weights   10/06/21 1027 10/12/21 1236  Weight: 96 kg 96 kg   Exam: General exam: Appears comfortable  HEENT: oral mucosa moist Respiratory system: Clear to auscultation.  Cardiovascular system: S1 & S2 heard  Gastrointestinal system: Abdomen soft, tender across upper abdomen this AM, nondistended. Normal bowel sounds   Extremities: No cyanosis, clubbing or edema Psychiatry:  Mood & affect appropriate.    Imaging and lab data was personally reviewed    CBC: Recent Labs  Lab 10/05/21 2054 10/06/21 0358  WBC 10.2 8.5  NEUTROABS 7.7  --   HGB 12.5 12.9  HCT 39.8 41.9  MCV 100.3* 101.2*  PLT 254 329    Basic Metabolic Panel: Recent Labs  Lab 10/05/21 2054 10/06/21 0358 10/07/21 1115 10/08/21 1852  NA 137 139 140 139  K 4.2 4.3 4.7 4.2  CL 104 104 105 112*  CO2 22 21* 21* 20*  GLUCOSE 189* 181* 224* 202*  BUN '23 22 20 15  '$ CREATININE 1.59* 1.54* 1.52* 1.30*  CALCIUM 9.5 9.8 9.1 8.5*  MG 1.7 1.9  --   --   PHOS  --  3.1  --   --     GFR: Estimated Creatinine Clearance: 39.4 mL/min (A) (by C-G formula based on SCr of 1.3 mg/dL (H)).  Scheduled Meds:  busPIRone  10 mg Oral q morning   clonazepam  0.25 mg Oral BID   diclofenac Sodium  2 g Topical TID   donepezil  10 mg Oral QHS   enoxaparin (LOVENOX) injection  40 mg Subcutaneous Q24H   famotidine   20 mg Oral Daily   feeding supplement  1 Container Oral TID BM   fesoterodine  4 mg Oral Daily   insulin aspart  0-9 Units Subcutaneous TID WC   levothyroxine  100 mcg Oral Q0600   mometasone-formoterol  2 puff Inhalation BID   pantoprazole  40 mg Oral Daily   rosuvastatin  20 mg Oral Daily   Continuous Infusions:  sodium chloride 75 mL/hr at 10/11/21 2209     LOS: 6 days   Author: Debbe Odea  10/12/2021 2:36 PM

## 2021-10-12 NOTE — Progress Notes (Signed)
Patient ate maybe 25% of lunch today (1/4 of a grilled cheese sandwich). Currently working on dinner ([place] sandwich and fruit cup). Tolerated food well so far with no nausea. Pt did have 1 episode of diarrhea since eating.

## 2021-10-12 NOTE — TOC Progression Note (Signed)
Transition of Care [facility]) - Progression Note    Patient Details  Name: Tara Jackson MRN: [id number] Date of Birth: 09/15/1940  Transition of Care [facility]) CM/SW Contact  Leeroy Cha, RN Phone Number: 10/12/2021, 2:51 PM  Clinical Narrative:     Patient and husband prefer to go home and not to snf.  Expected Discharge Plan: Home/Self Care Barriers to Discharge: Continued Medical Work up  Expected Discharge Plan and Services Expected Discharge Plan: Home/Self Care   Discharge Planning Services: CM Consult   Living arrangements for the past 2 months: Single Family Home                                       Social Determinants of Health (SDOH) Interventions    Readmission Risk Interventions   Row Labels 08/16/2021   12:41 PM  Readmission Risk Prevention Plan   Section Header. No data exists in this row.   Transportation Screening   Complete  PCP or Specialist Appt within 5-7 Days   Complete  Home Care Screening   Complete  Medication Review (RN CM)   Complete

## 2021-10-12 NOTE — Progress Notes (Signed)
[facility] Gastroenterology Progress Note  Tara Jackson 81 y.o. Jul 25, 1940  CC: Dysphagia   Subjective: Patient seen and examined at bedside.  No acute issues overnight.  Awaiting EGD today.  Family at bedside     Objective: Vital signs in last 24 hours: Vitals:   10/12/21 0522 10/12/21 0900  BP: (!) 139/46 (!) 149/63  Pulse: 60 61  Resp: 16 17  Temp: 97.7 F (36.5 C) (!) 97.5 F (36.4 C)  SpO2: 95% 99%    Physical Exam:  General -elderly patient, not in acute distress.  Somewhat lethargic. Abdomen -soft, nontender, nondistended, bowel sounds present, no peritoneal signs Neuro -alert  Lab Results: No results for input(s): "NA", "K", "CL", "CO2", "GLUCOSE", "BUN", "CREATININE", "CALCIUM", "MG", "PHOS" in the last 72 hours. No results for input(s): "AST", "ALT", "ALKPHOS", "BILITOT", "PROT", "ALBUMIN" in the last 72 hours. No results for input(s): "WBC", "NEUTROABS", "HGB", "HCT", "MCV", "PLT" in the last 72 hours. No results for input(s): "LABPROT", "INR" in the last 72 hours.    Assessment/Plan: -Vomiting with abnormal barium swallow showing small fixed narrowing at the distal esophagus likely stricture.  Also showed esophageal dysmotility. -Pacemaker mediated tachycardia -Dementia  Recommendations ------------------------- -Proceed with EGD with possible dilation if esophageal stricture and possible Botox injection if no evidence of stricture -Continue PPI -Procedure was discussed with the family at bedside today  Risks (bleeding, infection, bowel perforation that could require surgery, sedation-related changes in cardiopulmonary systems), benefits (identification and possible treatment of source of symptoms, exclusion of certain causes of symptoms), and alternatives (watchful waiting, radiographic imaging studies, empiric medical treatment)  were explained to family in detail and patient wishes to proceed.    Otis Brace MD, [place] 10/12/2021, 11:41  AM  Contact #  [phone]

## 2021-10-12 NOTE — Progress Notes (Signed)
Pt assisted to chair with heavy 2+ assist from RN, husband, and nursing student. Pt had difficulty moving feet to pivot to chair. Per pt's husband, pt is "weaker than normal." Pt is sitting in chair at this time with purewick in place. Pt and husband asking to go home today, awaiting Dr. Wynelle Cleveland to assess at bedside. Will continue to monitor.

## 2021-10-12 NOTE — Brief Op Note (Signed)
10/05/2021 - 10/12/2021  1:50 PM  PATIENT:  Tara Jackson  81 y.o. female  PRE-OPERATIVE DIAGNOSIS:  dysphagia  POST-OPERATIVE DIAGNOSIS:  Gatric BXS    r/o Hpylori, Esophageal botox injection  PROCEDURE:  Procedure(s): ESOPHAGOGASTRODUODENOSCOPY (EGD) WITH PROPOFOL (N/A) BIOPSY BOTOX INJECTION  SURGEON:  Surgeon(s) and Role:    * Dustin Bumbaugh, MD - Primary  Findings ------------ -EGD showed abnormal esophageal motility.  There was no evidence of distal esophageal stricture.  Small amount of retained liquid in the esophagus which was suctioned.  GE junction injected with Botox. -EGD also showed gastritis.  Biopsies taken.  Recommendations --------------------------- -Start soft diet and slowly advance as tolerated -Continue Protonix 40 mg daily -Okay to resume Plavix tomorrow -Findings discussed with patient's husband at bedside.  GI will follow  Otis Brace MD, [place] 10/12/2021, 1:51 PM  Contact #  [phone]

## 2021-10-12 NOTE — Transfer of Care (Signed)
Immediate Anesthesia Transfer of Care Note  Patient: Tara Jackson  Procedure(s) Performed: ESOPHAGOGASTRODUODENOSCOPY (EGD) WITH PROPOFOL BIOPSY BOTOX INJECTION  Patient Location: PACU and Endoscopy Unit  Anesthesia Type:MAC  Level of Consciousness: awake  Airway & Oxygen Therapy: Patient Spontanous Breathing and Patient connected to face mask oxygen  Post-op Assessment: Report given to RN and Post -op Vital signs reviewed and stable  Post vital signs: Reviewed and stable  Last Vitals:  Vitals Value Taken Time  BP    Temp    Pulse    Resp    SpO2      Last Pain:  Vitals:   10/12/21 1236  TempSrc: Tympanic  PainSc: 0-No pain      Patients Stated Pain Goal: 0 (71/24/58 0998)  Complications: No notable events documented.

## 2021-10-12 NOTE — Op Note (Signed)
[facility] Patient Name: Tara Jackson Procedure Date: 10/12/2021 MRN: [id number] Attending MD: Otis Brace , MD Date of Birth: September 28, 1940 CSN: [id number] Age: 81 Admit Type: Inpatient Procedure:                Upper GI endoscopy Indications:              Dysphagia, Abnormal cine-esophagram Providers:                Otis Brace, MD, Ladoris Gene, RN, Theodora Blow, Technician Referring MD:              Medicines:                Sedation Administered by an Anesthesia Professional Complications:            No immediate complications. Estimated Blood Loss:     Estimated blood loss was minimal. Procedure:                Pre-Anesthesia Assessment:                           - Prior to the procedure, a History and Physical                            was performed, and patient medications and                            allergies were reviewed. The patient's tolerance of                            previous anesthesia was also reviewed. The risks                            and benefits of the procedure and the sedation                            options and risks were discussed with the patient.                            All questions were answered, and informed consent                            was obtained. Prior Anticoagulants: The patient has                            taken Plavix (clopidogrel), last dose was 6 days                            prior to procedure. ASA Grade Assessment: III - A                            patient with severe systemic disease. After  reviewing the risks and benefits, the patient was                            deemed in satisfactory condition to undergo the                            procedure.                           After obtaining informed consent, the endoscope was                            passed under direct vision. Throughout the                            procedure, the  patient's blood pressure, pulse, and                            oxygen saturations were monitored continuously. The                            [id number]-H190 ([id number] Olympus endoscope was introduced                            through the mouth, and advanced to the second part                            of duodenum. The upper GI endoscopy was                            accomplished without difficulty. The patient                            tolerated the procedure well. Scope In: Scope Out: Findings:      Fluid was found in the lower third of the esophagus.      The Z-line was regular and was found 39 cm from the incisors. No       evidence of stricture.      Abnormal motility was noted in the esophagus. GE junction area was       successfully injected with botulinum toxin.      Segmental moderate inflammation characterized by congestion (edema),       erythema and friability was found in the entire examined stomach.       Biopsies were taken with a cold forceps for histology.      The cardia and gastric fundus were normal on retroflexion.      The duodenal bulb, first portion of the duodenum and second portion of       the duodenum were normal. Impression:               - Fluid in the lower third of the esophagus.                           - Z-line regular, 39 cm from the incisors.                           -  Abnormal esophageal motility. Injected with                            botulinum toxin.                           - Chronic atrophic gastritis. Biopsied.                           - Normal duodenal bulb, first portion of the                            duodenum and second portion of the duodenum. Moderate Sedation:      Moderate (conscious) sedation was personally administered by an       anesthesia professional. The following parameters were monitored: oxygen       saturation, heart rate, blood pressure, and response to care. Recommendation:           - Return patient to hospital ward  for ongoing care.                           - Soft diet.                           - Continue present medications.                           - Await pathology results.                           - Resume Plavix (clopidogrel) at prior dose                            tomorrow. Procedure Code(s):        --- Professional ---                           [phone], 59, Esophagogastroduodenoscopy, flexible,                            transoral; with directed submucosal injection(s),                            any substance                           43239, Esophagogastroduodenoscopy, flexible,                            transoral; with biopsy, single or multiple Diagnosis Code(s):        --- Professional ---                           K22.4, Dyskinesia of esophagus                           K29.40, Chronic atrophic gastritis without bleeding  R13.10, Dysphagia, unspecified                           R93.3, Abnormal findings on diagnostic imaging of                            other parts of digestive tract CPT copyright 2019 [employer]. All rights reserved. The codes documented in this report are preliminary and upon coder review may  be revised to meet current compliance requirements. Otis Brace, MD Otis Brace, MD 10/12/2021 1:42:49 PM Number of Addenda: 0

## 2021-10-12 NOTE — Progress Notes (Signed)
Occupational Therapy Treatment Patient Details Name: Tara Jackson MRN: [id number] DOB: Jan 03, 1941 Today's Date: 10/12/2021   History of present illness 81 year old female with dementia, who is bedbound/wheelchair-bound for little over a month, essential hypertension, diabetes mellitus type 2, CKD stage IIIb, chronic diastolic heart failure, morbid obesity, sick sinus syndrome status post pacemaker and admitted to the hospital 10/05/21 for chest pain (appears to be musculoskeletal) and nausea/vomiting.   OT comments  Patient and husband were educated on current recommendations for assist levels with ADLs and level of caregiver support at home to be most successful. Patient and husband endorsed that they could handle patients current physical assist level at home. Patient declined to practice transfers or attempt ADLs at this time pending EGD at 1pm. Nurse made aware. Patient would continue to benefit from skilled OT services at this time while admitted and after d/c to address noted deficits in order to improve overall safety and independence in ADLs.     Recommendations for follow up therapy are one component of a multi-disciplinary discharge planning process, led by the attending physician.  Recommendations may be updated based on patient status, additional functional criteria and insurance authorization.    Follow Up Recommendations  Skilled nursing-short term rehab (<3 hours/day)    Assistance Recommended at Discharge Frequent or constant Supervision/Assistance  Patient can return home with the following  A little help with bathing/dressing/bathroom;A lot of help with bathing/dressing/bathroom;Direct supervision/assist for financial management;Assistance with cooking/housework;Direct supervision/assist for medications management   Equipment Recommendations  None recommended by OT    Recommendations for Other Services      Precautions / Restrictions Precautions Precautions:  Fall Precaution Comments: non amb, uses a scooter, has a lift chair Restrictions Weight Bearing Restrictions: No       Mobility Bed Mobility                    Transfers                         Balance                                           ADL either performed or assessed with clinical judgement   ADL Overall ADL's : Needs assistance/impaired                                       General ADL Comments: patient declined to engage in ADLs at this time. patient reported plan was to transition home with husband after proceedure today. patient and husband were educated on extensive assistance for attempted standing on 9/4 with PT and importance of working on safe transfers today to reduce caregiver burden in next level of care. patient reported she was pending a proceedure at this time. patient's husband reportd he was helping at home prior level with transfers. patient and husband were educated on extensive assistance for transfers and having been in bed for extended period of time. patients husband reported that he could help her in next level of care. patient was educated on recommendation for 24/7 caregiver support in next level of care. patient and husband verbalized understanding reporting that they have a "PT/nurse" that they call who can help one day a week. patient and husband were agian educated  on importacne of having more caregiver support if transitioning home at this time.patient verbalized understanding.  patient declined to further engage in therapy at this time.nurse made aware,    Extremity/Trunk Assessment              Vision       Perception     Praxis      Cognition Arousal/Alertness: Lethargic Behavior During Therapy: WFL for tasks assessed/performed Overall Cognitive Status: History of cognitive impairments - at baseline                                 General Comments: vocal about  fears of falling        Exercises      Shoulder Instructions       General Comments      Pertinent Vitals/ Pain       Pain Assessment Pain Assessment: Faces Faces Pain Scale: Hurts a little bit Pain Location: B LE Pain Descriptors / Indicators: Discomfort Pain Intervention(s): Monitored during session  Home Living                                          Prior Functioning/Environment              Frequency  Min 2X/week        Progress Toward Goals  OT Goals(current goals can now be found in the care plan section)  Progress towards OT goals: OT to reassess next treatment     Plan Discharge plan remains appropriate    Co-evaluation                 AM-PAC OT "6 Clicks" Daily Activity     Outcome Measure   Help from another person eating meals?: Total (NPO pending proceedure) Help from another person taking care of personal grooming?: A Little Help from another person toileting, which includes using toliet, bedpan, or urinal?: Total Help from another person bathing (including washing, rinsing, drying)?: A Lot Help from another person to put on and taking off regular upper body clothing?: A Little Help from another person to put on and taking off regular lower body clothing?: A Lot 6 Click Score: 12    End of Session    OT Visit Diagnosis: Muscle weakness (generalized) (M62.81);Unsteadiness on feet (R26.81)   Activity Tolerance Other (comment) (declined mobility)   Patient Left in bed;with call bell/phone within reach;with bed alarm set;with family/visitor present   Nurse Communication Mobility status        Time: 2426-8341 OT Time Calculation (min): 9 min  Charges: OT General Charges $OT Visit: 1 Visit OT Treatments $Therapeutic Activity: 8-22 mins  Rennie Plowman, MS Acute Rehabilitation Department Office# [phone]   Marcellina Millin 10/12/2021, 11:33 AM

## 2021-10-13 DIAGNOSIS — R0789 Other chest pain: Secondary | ICD-10-CM | POA: Diagnosis not present

## 2021-10-13 DIAGNOSIS — N1832 Chronic kidney disease, stage 3b: Secondary | ICD-10-CM | POA: Diagnosis not present

## 2021-10-13 DIAGNOSIS — R112 Nausea with vomiting, unspecified: Secondary | ICD-10-CM | POA: Diagnosis not present

## 2021-10-13 DIAGNOSIS — Z950 Presence of cardiac pacemaker: Secondary | ICD-10-CM | POA: Diagnosis not present

## 2021-10-13 LAB — GLUCOSE, CAPILLARY
Glucose-Capillary: 158 mg/dL — ABNORMAL HIGH (ref 70–99)
Glucose-Capillary: 222 mg/dL — ABNORMAL HIGH (ref 70–99)

## 2021-10-13 MED ORDER — PANTOPRAZOLE SODIUM 40 MG PO TBEC: 40.0000 mg | DELAYED_RELEASE_TABLET | Freq: Two times a day (BID) | ORAL | Status: DC

## 2021-10-13 MED ORDER — PANTOPRAZOLE SODIUM 40 MG PO TBEC: 40.0000 mg | DELAYED_RELEASE_TABLET | Freq: Two times a day (BID) | ORAL | 0 refills | Status: AC

## 2021-10-13 MED ORDER — CLONAZEPAM 0.5 MG PO TABS: 0.5000 mg | ORAL_TABLET | Freq: Two times a day (BID) | ORAL | Status: AC

## 2021-10-13 MED ORDER — PANTOPRAZOLE SODIUM 40 MG PO TBEC: 40.0000 mg | DELAYED_RELEASE_TABLET | Freq: Every day | ORAL | 0 refills | Status: DC

## 2021-10-13 MED ORDER — CLOPIDOGREL BISULFATE 75 MG PO TABS
75.0000 mg | ORAL_TABLET | Freq: Every morning | ORAL | Status: DC
  Administered 2021-10-13: 75 mg via ORAL
  Filled 2021-10-13: qty 1

## 2021-10-13 NOTE — Plan of Care (Signed)
  Problem: Coping: Goal: Ability to adjust to condition or change in health will improve Outcome: Progressing   Problem: Fluid Volume: Goal: Ability to maintain a balanced intake and output will improve Outcome: Progressing   Problem: Skin Integrity: Goal: Risk for impaired skin integrity will decrease Outcome: Progressing   Problem: Tissue Perfusion: Goal: Adequacy of tissue perfusion will improve Outcome: Progressing   

## 2021-10-13 NOTE — Progress Notes (Signed)
[facility] Progress Note  Tara Jackson 81 y.o. 1940/08/18   Subjective: Patient seen and examined sitting in bed.  Notes her dysphagia to liquids has improved.  She is able to drink water or juice without feelings of dysphagia.  She has not tried anything more than a liquid diet at this time.  ROS : Review of Systems  Gastrointestinal:  Negative for abdominal pain, blood in stool, constipation, diarrhea, heartburn, melena, nausea and vomiting.  Genitourinary:  Negative for dysuria and urgency.    Objective: Vital signs in last 24 hours: Vitals:   10/12/21 2149 10/13/21 0632  BP: (!) 143/67 (!) 144/53  Pulse: 67 64  Resp: 16 18  Temp: 98.1 F (36.7 C) 99 F (37.2 C)  SpO2: 98% 96%    Physical Exam:  General:  Alert, cooperative, no distress, appears stated age  Head:  Normocephalic, without obvious abnormality, atraumatic  Eyes:  Anicteric sclera, EOM's intact  Lungs:   Clear to auscultation bilaterally, respirations unlabored  Heart:  Regular rate and rhythm, S1, S2 normal  Abdomen:   Soft, non-tender, bowel sounds active all four quadrants,  no masses,   Extremities: Extremities normal, atraumatic, no  edema  Pulses: 2+ and symmetric    Lab Results: Recent Labs    10/12/21 1632  NA 139  K 3.7  CL 111  CO2 21*  GLUCOSE 147*  BUN 12  CREATININE 1.26*  CALCIUM 8.7*    Assessment&plan: Dysphagia  EGD 10/12/2021 Liquid in the lower third esophagus, abnormal esophageal motility injection of Botox, chronic atrophic gastritis biopsied  Awaiting biopsy results. Patient notes her dysphagia is improved at this time.  She was previously having dysphagia symptoms with drinking liquids.  She no longer having dysphagia to liquids.  She has not attempted soft diet at this time.  We will plan to advance diet as tolerated. Can restart Plavix today. [facility] will sign off.  [name] PA-C 10/13/2021, 10:19 AM  Contact #  [phone]

## 2021-10-13 NOTE — Discharge Summary (Signed)
Physician Discharge Summary  Tara Jackson VQM:[id number] DOB: 07-09-1940 DOA: 10/05/2021  PCP: Shon Baton, MD  Admit date: 10/05/2021 Discharge date: 10/13/2021  Admitted From: Home Disposition: Home  Recommendations for Outpatient Follow-up:  Follow up with PCP in 1 week with repeat CBC/BMP Outpatient follow-up with GI Recommend outpatient evaluation and follow-up by palliative care Follow up in ED if symptoms worsen or new appear   Home Health: No Equipment/Devices: None  Discharge Condition: Stable CODE STATUS: Full Diet recommendation: Heart healthy/carb modified  Brief/Interim Summary:  81 year old female with dementia, who is bedbound/wheelchair-bound for little over a month, essential hypertension, diabetes mellitus type 2, CKD stage IIIb, chronic diastolic heart failure, sick sinus syndrome status post pacemaker presented with chest pain, intermittent vomiting and poor oral intake.  During the hospitalization, chest pain resolved.  She had intermittent vomiting with poor oral intake.  GI was consulted.  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  GI has advanced the diet and cleared for Plavix to be started.  GI has cleared the patient for discharge on Protonix twice daily.  She will be discharged home today.  Discharge Diagnoses:   Chest pain -Possibly musculoskeletal.  Improved with Voltaren gel.  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  GI has advanced the diet and cleared for Plavix to be started.  GI has cleared the patient for discharge on Protonix twice daily.  She will be discharged home today.  Cardiac pacemaker in situ-pacer malfunction -Patient has been interrogated  and reprogrammed.  Heart rate improved.  Cardiology evaluation appreciated.  Outpatient follow-up with cardiology  Diabetes mellitus type 2 with hyperglycemia -Hold oral regimen on discharge.  Continue home insulin regimen.  Outpatient follow-up with PCP  CKD stage IIIb -Creatinine stable.  Monitor intermittently  Dementia -Continue Aricept.  Outpatient follow-up  Anxiety -Continue home regimen  Hypothyroidism -Continue Synthroid  OSA -Outpatient follow-up  Mostly bedbound -PT recommended SNF placement.  Husband/patient want to go home  Discharge Instructions  Discharge Instructions     Amb Referral to Palliative Care   Complete by: As directed    Diet - low sodium heart healthy   Complete by: As directed    Diet Carb Modified   Complete by: As directed    Increase activity slowly   Complete by: As directed       Allergies as of 10/13/2021       Reactions   Iodine Other (See Comments)   ONLY IV--Makes unconscious   Iohexol Other (See Comments)    Code: SOB, Desc: cardiac arrest w/ iv contrast, has never used 13 hr prep//alice calhoun, Onset Date: [id number]   Metoclopramide Hcl Other (See Comments)   suicidal   Sulfa Antibiotics Hives   Lactose Intolerance (gi) Diarrhea   Lansoprazole Nausea And Vomiting        Medication List     STOP taking these medications    esomeprazole 40 MG capsule Commonly known as: NEXIUM   famotidine 20 MG tablet Commonly known as: PEPCID   glipiZIDE 10 MG tablet Commonly known as: GLUCOTROL   potassium chloride SA 20 MEQ tablet Commonly known as: KLOR-CON M       TAKE these medications    acetaminophen 325 MG tablet Commonly known  as: Tylenol Take 2 tablets (650 mg total) by mouth every 6 (six) hours as needed for up to 30 doses for mild pain or moderate pain.   albuterol 108 (90 Base) MCG/ACT inhaler Commonly known as: VENTOLIN HFA INHALE 2 INHALATIONS 3 TIMES A DAY AS NEEDED FOR COUGH/WHEEZE What changed:  See the new instructions.   busPIRone 10 MG tablet Commonly known as: BUSPAR Take 1 tablet (10 mg total) by mouth every morning.   clonazePAM 0.5 MG tablet Commonly known as: KLONOPIN Take 1 tablet (0.5 mg total) by mouth 2 (two) times daily. What changed: how much to take   clopidogrel 75 MG tablet Commonly known as: PLAVIX Take 75 mg by mouth every morning.   dicyclomine 20 MG tablet Commonly known as: BENTYL Take 40 mg by mouth in the morning and at bedtime. for cramping   donepezil 10 MG tablet Commonly known as: ARICEPT TAKE 1 TABLET BY MOUTH EVERYDAY AT BEDTIME What changed: See the new instructions.   guaiFENesin 600 MG 12 hr tablet Commonly known as: MUCINEX Take 600 mg by mouth 2 (two) times daily as needed for cough.   insulin NPH Human 100 UNIT/ML injection Commonly known as: NOVOLIN N Inject 5-20 Units into the skin See admin instructions. In the morning:  If cbg >300, then give 20 units. If cbg < or = 300, then give 15 unit At night: If cbg >250, then give 5 units. If cbg < or = 250, then give no insulin   insulin regular 100 units/mL injection Commonly known as: NOVOLIN R Inject 0-30 Units into the skin See admin instructions. At breakfast: if cbg < 300, then give 25 units. If cbg > or = 300, then give 30 units. After dinner: goal cbgs is 150 and give insulin dose to reach this. For every 100 cbg over 150, then give 10 units of insulin.   levothyroxine 100 MCG tablet Commonly known as: SYNTHROID Take 100 mcg by mouth daily before breakfast.   lidocaine 5 % Commonly known as: LIDODERM Place 2 patches onto the skin daily as needed (pain). Remove & Discard patch within 12 hours or as directed by MD   linaclotide 290 MCG Caps capsule Commonly known as: LINZESS Take 290 mcg by mouth daily as needed (for constipation).   Muscle Rub 10-15 % Crea Apply 1 application topically as needed for muscle pain.   nortriptyline 50 MG capsule Commonly known as:  PAMELOR Take 50 mg by mouth at bedtime.   OXYGEN Inhale 2 L into the lungs as needed. Used at overnight with a concentrator   pantoprazole 40 MG tablet Commonly known as: PROTONIX Take 1 tablet (40 mg total) by mouth 2 (two) times daily.   rosuvastatin 20 MG tablet Commonly known as: CRESTOR Take 20 mg by mouth daily.   solifenacin 5 MG tablet Commonly known as: VESICARE Take 5 mg by mouth daily as needed (to prevent UTI).   Symbicort 80-4.5 MCG/ACT inhaler Generic drug: budesonide-formoterol INHALE 2 PUFFS FIRST THING IN THE MORNING THEN ANOTHER 2 PUFFS ABOUT 12 HOURS LATER   terconazole 0.4 % vaginal cream Commonly known as: TERAZOL 7 Place 1 applicator vaginally daily as needed for itching.   Vitamin D-3 25 MCG (1000 UT) Caps Take 3,000 Units by mouth every morning.        Follow-up Information     Clarene Essex, MD. Schedule an appointment as soon as possible for a visit in 3 month(s).   Specialty: Gastroenterology Why: Dysphagia  Contact information: [address] [place] [address] [phone]                Allergies  Allergen Reactions   Iodine Other (See Comments)    ONLY IV--Makes unconscious   Iohexol Other (See Comments)     Code: SOB, Desc: cardiac arrest w/ iv contrast, has never used 13 hr prep//alice calhoun, Onset Date: [id number]    Metoclopramide Hcl Other (See Comments)    suicidal   Sulfa Antibiotics Hives   Lactose Intolerance (Gi) Diarrhea   Lansoprazole Nausea And Vomiting    Consultations: GI/cardiology   Procedures/Studies: ECHOCARDIOGRAM COMPLETE  Result Date: 10/11/2021    ECHOCARDIOGRAM REPORT   Patient Name:   NAAVYA POSTMA Schmelzer Date of Exam: 10/11/2021 Medical Rec #:  [id number]       Height:       67.5 in Accession #:    [id number]      Weight:       211.6 lb Date of Birth:  1940/07/28       BSA:          2.083 m Patient Age:    63 years        BP:           110/70 mmHg Patient Gender: F               HR:            111 bpm. Exam Location:  Inpatient Procedure: 2D Echo, Cardiac Doppler, Color Doppler and Intracardiac            Opacification Agent Indications:    R94.31 Abnormal EKG; R07.9* Chest pain, unspecified.  History:        Patient has prior history of Echocardiogram examinations, most                 recent 10/26/2017. Abnormal ECG and Pacemaker,                 Arrythmias:Tachycardia, Signs/Symptoms:Shortness of Breath,                 Dyspnea, Altered Mental Status, Chest Pain and Syncope; Risk                 Factors:Diabetes, Dyslipidemia and Hypertension.  Sonographer:    Roseanna Rainbow RDCS Referring Phys: [phone] Nadean Corwin HILTY  Sonographer Comments: Technically difficult study due to poor echo windows and patient is obese. Image acquisition challenging due to patient body habitus. Difficult study. Patient supine, required lift to get back to bed, and was sensitive to pressure from probe. IMPRESSIONS  1. Left ventricular ejection fraction, by estimation, is 60 to 65%. The left ventricle has normal function. The left ventricle demonstrates regional wall motion abnormalities (see scoring diagram/findings for description). There is moderate left ventricular hypertrophy. Indeterminate diastolic filling due to E-A fusion.  2. Right ventricular systolic function is mildly reduced. The right ventricular size is normal. There is normal pulmonary artery systolic pressure. The estimated right ventricular systolic pressure is 50.0 mmHg.  3. The mitral valve is grossly normal. Trivial mitral valve regurgitation.  4. The aortic valve is tricuspid. Aortic valve regurgitation is not visualized.  5. The inferior vena cava is dilated in size with >50% respiratory variability, suggesting right atrial pressure of 8 mmHg.  6. Rhythm strip during this exam demonstrates paced tachycardia. Comparison(s): Changes from prior study are noted. 10/26/2017: LVEF 60-65%, grade 1 DD, RVSP 33 mmHg. FINDINGS  Left  Ventricle: Left ventricular  ejection fraction, by estimation, is 60 to 65%. The left ventricle has normal function. The left ventricle demonstrates regional wall motion abnormalities. The left ventricular internal cavity size was normal in size. There is moderate left ventricular hypertrophy. Abnormal (paradoxical) septal motion, consistent with RV pacemaker. Indeterminate diastolic filling due to E-A fusion. Right Ventricle: The right ventricular size is normal. No increase in right ventricular wall thickness. Right ventricular systolic function is mildly reduced. There is normal pulmonary artery systolic pressure. The tricuspid regurgitant velocity is 2.58 m/s, and with an assumed right atrial pressure of 8 mmHg, the estimated right ventricular systolic pressure is 02.5 mmHg. Left Atrium: Left atrial size was normal in size. Right Atrium: Right atrial size was normal in size. Pericardium: There is no evidence of pericardial effusion. Mitral Valve: The mitral valve is grossly normal. Trivial mitral valve regurgitation. Tricuspid Valve: The tricuspid valve is grossly normal. Tricuspid valve regurgitation is mild. Aortic Valve: The aortic valve is tricuspid. Aortic valve regurgitation is not visualized. Pulmonic Valve: The pulmonic valve was grossly normal. Pulmonic valve regurgitation is trivial. Aorta: The aortic root and ascending aorta are structurally normal, with no evidence of dilitation. Venous: The inferior vena cava is dilated in size with greater than 50% respiratory variability, suggesting right atrial pressure of 8 mmHg. IAS/Shunts: No atrial level shunt detected by color flow Doppler. EKG: Rhythm strip during this exam demonstrates paced tachycardia. Additional Comments: A device lead is visualized.  LEFT VENTRICLE PLAX 2D LVIDd:         3.45 cm   Diastology LVIDs:         2.85 cm   LV e' medial:    4.87 cm/s LV PW:         1.40 cm   LV E/e' medial:  13.6 LV IVS:        1.30 cm   LV e' lateral:   4.78 cm/s LVOT diam:     1.90 cm    LV E/e' lateral: 13.8 LV SV:         30 LV SV Index:   14 LVOT Area:     2.84 cm  RIGHT VENTRICLE            IVC RV S prime:     8.76 cm/s  IVC diam: 2.60 cm TAPSE (M-mode): 1.7 cm LEFT ATRIUM             Index        RIGHT ATRIUM           Index LA diam:        3.75 cm 1.80 cm/m   RA Area:     10.90 cm LA Vol (A2C):   50.2 ml 24.10 ml/m  RA Volume:   25.00 ml  12.00 ml/m LA Vol (A4C):   34.9 ml 16.76 ml/m LA Biplane Vol: 43.2 ml 20.74 ml/m  AORTIC VALVE LVOT Vmax:   93.15 cm/s LVOT Vmean:  59.700 cm/s LVOT VTI:    0.106 m  AORTA Ao Root diam: 3.05 cm Ao Asc diam:  3.00 cm MITRAL VALVE               TRICUSPID VALVE MV Area (PHT): 4.60 cm    TR Peak grad:   26.6 mmHg MV Decel Time: 165 msec    TR Vmax:        258.00 cm/s MV E velocity: 66.00 cm/s  SHUNTS                            Systemic VTI:  0.11 m                            Systemic Diam: 1.90 cm Lyman Bishop MD Electronically signed by Lyman Bishop MD Signature Date/Time: 10/11/2021/2:16:31 PM    Final    DG UGI W SINGLE CM (SOL OR THIN BA)  Result Date: 10/07/2021 CLINICAL DATA:  Patient with complaint of nausea, vomiting, gagging when eating and weight loss. Request for upper GI study. EXAM: DG UGI W SINGLE CM TECHNIQUE: Scout radiograph was obtained. Single contrast examination was performed using thin liquid barium. This exam was performed by Narda Rutherford, NP, and was supervised and interpreted by Fabiola Backer, MD. FLUOROSCOPY: Radiation Exposure Index (as provided by the fluoroscopic device): 20.30 mGy Kerma COMPARISON:  CT abdomen pelvis dated 10/05/2021. FINDINGS: Limited examination due to patient's inability to stand or reposition on fluoroscopy table. Scout Radiograph: Normal bowel gas pattern.  Prior cholecystectomy. Esophagus: Smooth fixed narrowing in the distal esophagus just proximal to the GE junction, measuring 7 mm in diameter. Esophageal motility: Tertiary contractions in the mid and distal esophagus.  Gastroesophageal reflux: A small amount of reflux into the distal esophagus occurred spontaneously. Ingested 98m barium tablet:  Not given. Stomach: Small hiatal hernia. Gastric emptying: Normal. Duodenum:  Normal appearance. Other:  None. IMPRESSION: 1. Limited study. 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 2. Mild gastroesophageal reflux.  Small hiatal hernia. 3. Age related esophageal dysmotility. Read by: SNarda Rutherford AGNP-BC Electronically Signed   By: WTitus DubinM.D.   On: 10/07/2021 09:25   CT ABDOMEN PELVIS WO CONTRAST  Result Date: 10/05/2021 CLINICAL DATA:  Nausea and vomiting. Constipation. Generalized weakness. EXAM: CT ABDOMEN AND PELVIS WITHOUT CONTRAST TECHNIQUE: Multidetector CT imaging of the abdomen and pelvis was performed following the standard protocol without IV contrast. RADIATION DOSE REDUCTION: This exam was performed according to the departmental dose-optimization program which includes automated exposure control, adjustment of the mA and/or kV according to patient size and/or use of iterative reconstruction technique. COMPARISON:  08/05/2013. FINDINGS: Lower chest: The heart is normal in size and pacemaker leads are noted in the right heart. Mild atelectasis or scarring is present at the lung bases. Hepatobiliary: No focal liver abnormality is seen. Status post cholecystectomy. No biliary dilatation. Pancreas: Pancreas is atrophic. There is a hypodensity in the tail of the pancreas with cystic attenuation measuring 1.2 cm. No pancreatic ductal dilatation. Spleen: Normal in size without focal abnormality. Adrenals/Urinary Tract: Adrenal glands are unremarkable. Kidneys are normal, without renal calculi, focal lesion, or hydronephrosis. Bladder is unremarkable. Stomach/Bowel: Small hiatal hernia is present. Stomach is otherwise within normal limits. Appendix is not seen. No evidence of acute bowel wall thickening,  distention, or inflammatory changes. No free air or pneumatosis. A moderate amount of retained stool is present in the colon. Vascular/Lymphatic: Aortic atherosclerosis. No enlarged abdominal or pelvic lymph nodes. Reproductive: Status post hysterectomy. No adnexal masses. Other: No abdominopelvic ascites. Pelvic floor laxity is noted and unchanged from the previous exam. Musculoskeletal: Degenerative changes in the thoracolumbar spine. No acute osseous abnormality. IMPRESSION: 1. No acute intra-abdominal process. 2. Moderate amount of retained stool in the colon, compatible with history of constipation. 3. Small hiatal hernia. 4. 1.2  cm cystic structure in the tail of the pancreas. Two year follow-up is recommended for surveillance. 5. Aortic atherosclerosis. Electronically Signed   By: Brett Fairy M.D.   On: 10/05/2021 20:48   CUP PACEART REMOTE DEVICE CHECK  Result Date: 09/16/2021 Scheduled remote reviewed. Normal device function.  Next remote 91 days. Kathy Breach, RN, CCDS, [employer]     Subjective: Patient seen and examined at bedside.  Feels okay to go home today.  Denies any fever, nausea, vomiting.  Oral intake is still poor but improving.  Discharge Exam: Vitals:   10/12/21 2149 10/13/21 0632  BP: (!) 143/67 (!) 144/53  Pulse: 67 64  Resp: 16 18  Temp: 98.1 F (36.7 C) 99 F (37.2 C)  SpO2: 98% 96%    General: Pt is alert, awake, not in acute distress.  Currently on room air. Cardiovascular: rate controlled, S1/S2 + Respiratory: bilateral decreased breath sounds at bases with scattered crackles Abdominal: Soft, NT, ND, bowel sounds + Extremities: Trace lower extremity edema; no cyanosis    The results of significant diagnostics from this hospitalization (including imaging, microbiology, ancillary and laboratory) are listed below for reference.     Microbiology: Recent Results (from the past 240 hour(s))  Resp Panel by RT-PCR (Flu A&B, Covid) Anterior Nasal  Swab     Status: None   Collection Time: 10/09/21  9:40 AM   Specimen: Anterior Nasal Swab  Result Value Ref Range Status   SARS Coronavirus 2 by RT PCR NEGATIVE NEGATIVE Final    Comment: (NOTE) SARS-CoV-2 target nucleic acids are NOT DETECTED.  The SARS-CoV-2 RNA is generally detectable in upper respiratory specimens during the acute phase of infection. The lowest concentration of SARS-CoV-2 viral copies this assay can detect is 138 copies/mL. A negative result does not preclude SARS-Cov-2 infection and should not be used as the sole basis for treatment or other patient management decisions. A negative result may occur with  improper specimen collection/handling, submission of specimen other than nasopharyngeal swab, presence of viral mutation(s) within the areas targeted by this assay, and inadequate number of viral copies(<138 copies/mL). A negative result must be combined with clinical observations, patient history, and epidemiological information. The expected result is Negative.  Fact Sheet for Patients:  EntrepreneurPulse.com.au  Fact Sheet for Healthcare Providers:  IncredibleEmployment.be  This test is no t yet approved or cleared by the [place] FDA and  has been authorized for detection and/or diagnosis of SARS-CoV-2 by FDA under an Emergency Use Authorization (EUA). This EUA will remain  in effect (meaning this test can be used) for the duration of the COVID-19 declaration under Section 564(b)(1) of the Act, 21 U.S.C.section 360bbb-3(b)(1), unless the authorization is terminated  or revoked sooner.       Influenza A by PCR NEGATIVE NEGATIVE Final   Influenza B by PCR NEGATIVE NEGATIVE Final    Comment: (NOTE) The Xpert Xpress SARS-CoV-2/FLU/RSV plus assay is intended as an aid in the diagnosis of influenza from Nasopharyngeal swab specimens and should not be used as a sole basis for treatment. Nasal washings and aspirates  are unacceptable for Xpert Xpress SARS-CoV-2/FLU/RSV testing.  Fact Sheet for Patients: EntrepreneurPulse.com.au  Fact Sheet for Healthcare Providers: IncredibleEmployment.be  This test is not yet approved or cleared by the [place] FDA and has been authorized for detection and/or diagnosis of SARS-CoV-2 by FDA under an Emergency Use Authorization (EUA). This EUA will remain in effect (meaning this test can be used) for the duration  of the COVID-19 declaration under Section 564(b)(1) of the Act, 21 U.S.C. section 360bbb-3(b)(1), unless the authorization is terminated or revoked.  Performed at [facility], [place] [address] [place], [place] [address]   Respiratory (~20 pathogens) panel by PCR     Status: None   Collection Time: 10/09/21  9:40 AM   Specimen: Nasopharyngeal Swab; Respiratory  Result Value Ref Range Status   Adenovirus NOT DETECTED NOT DETECTED Final   Coronavirus 229E NOT DETECTED NOT DETECTED Final    Comment: (NOTE) The Coronavirus on the Respiratory Panel, DOES NOT test for the novel  Coronavirus (2019 nCoV)    Coronavirus HKU1 NOT DETECTED NOT DETECTED Final   Coronavirus NL63 NOT DETECTED NOT DETECTED Final   Coronavirus OC43 NOT DETECTED NOT DETECTED Final   Metapneumovirus NOT DETECTED NOT DETECTED Final   Rhinovirus / Enterovirus NOT DETECTED NOT DETECTED Final   Influenza A NOT DETECTED NOT DETECTED Final   Influenza B NOT DETECTED NOT DETECTED Final   Parainfluenza Virus 1 NOT DETECTED NOT DETECTED Final   Parainfluenza Virus 2 NOT DETECTED NOT DETECTED Final   Parainfluenza Virus 3 NOT DETECTED NOT DETECTED Final   Parainfluenza Virus 4 NOT DETECTED NOT DETECTED Final   Respiratory Syncytial Virus NOT DETECTED NOT DETECTED Final   Bordetella pertussis NOT DETECTED NOT DETECTED Final   Bordetella Parapertussis NOT DETECTED NOT DETECTED Final   Chlamydophila pneumoniae NOT DETECTED NOT  DETECTED Final   Mycoplasma pneumoniae NOT DETECTED NOT DETECTED Final    Comment: Performed at [facility], [place]. [address], [place], [place] [address]     Labs: BNP (last 3 results) No results for input(s): "BNP" in the last 8760 hours. Basic Metabolic Panel: Recent Labs  Lab 10/07/21 1115 10/08/21 1852 10/12/21 1632  NA 140 139 139  K 4.7 4.2 3.7  CL 105 112* 111  CO2 21* 20* 21*  GLUCOSE 224* 202* 147*  BUN '20 15 12  '$ CREATININE 1.52* 1.30* 1.26*  CALCIUM 9.1 8.5* 8.7*   Liver Function Tests: No results for input(s): "AST", "ALT", "ALKPHOS", "BILITOT", "PROT", "ALBUMIN" in the last 168 hours. No results for input(s): "LIPASE", "AMYLASE" in the last 168 hours. No results for input(s): "AMMONIA" in the last 168 hours. CBC: No results for input(s): "WBC", "NEUTROABS", "HGB", "HCT", "MCV", "PLT" in the last 168 hours. Cardiac Enzymes: No results for input(s): "CKTOTAL", "CKMB", "CKMBINDEX", "TROPONINI" in the last 168 hours. BNP: Invalid input(s): "POCBNP" CBG: Recent Labs  Lab 10/12/21 0744 10/12/21 1121 10/12/21 1635 10/13/21 0627 10/13/21 1146  GLUCAP 154* 150* 137* 158* 222*   D-Dimer No results for input(s): "DDIMER" in the last 72 hours. Hgb A1c No results for input(s): "HGBA1C" in the last 72 hours. Lipid Profile No results for input(s): "CHOL", "HDL", "LDLCALC", "TRIG", "CHOLHDL", "LDLDIRECT" in the last 72 hours. Thyroid function studies No results for input(s): "TSH", "T4TOTAL", "T3FREE", "THYROIDAB" in the last 72 hours.  Invalid input(s): "FREET3" Anemia work up No results for input(s): "VITAMINB12", "FOLATE", "FERRITIN", "TIBC", "IRON", "RETICCTPCT" in the last 72 hours. Urinalysis    Component Value Date/Time   COLORURINE YELLOW 10/05/2021 2344   APPEARANCEUR CLEAR 10/05/2021 2344   LABSPEC 1.011 10/05/2021 2344   PHURINE 5.0 10/05/2021 2344   GLUCOSEU NEGATIVE 10/05/2021 2344   HGBUR SMALL (A) 10/05/2021 2344   BILIRUBINUR NEGATIVE  10/05/2021 [place] 10/05/2021 2344   PROTEINUR NEGATIVE 10/05/2021 2344   UROBILINOGEN 1.0 11/28/2014 1125   NITRITE NEGATIVE 10/05/2021 2344  LEUKOCYTESUR NEGATIVE 10/05/2021 2344   Sepsis Labs No results for input(s): "WBC" in the last 168 hours.  Invalid input(s): "PROCALCITONIN", "LACTICIDVEN" Microbiology Recent Results (from the past 240 hour(s))  Resp Panel by RT-PCR (Flu A&B, Covid) Anterior Nasal Swab     Status: None   Collection Time: 10/09/21  9:40 AM   Specimen: Anterior Nasal Swab  Result Value Ref Range Status   SARS Coronavirus 2 by RT PCR NEGATIVE NEGATIVE Final    Comment: (NOTE) SARS-CoV-2 target nucleic acids are NOT DETECTED.  The SARS-CoV-2 RNA is generally detectable in upper respiratory specimens during the acute phase of infection. The lowest concentration of SARS-CoV-2 viral copies this assay can detect is 138 copies/mL. A negative result does not preclude SARS-Cov-2 infection and should not be used as the sole basis for treatment or other patient management decisions. A negative result may occur with  improper specimen collection/handling, submission of specimen other than nasopharyngeal swab, presence of viral mutation(s) within the areas targeted by this assay, and inadequate number of viral copies(<138 copies/mL). A negative result must be combined with clinical observations, patient history, and epidemiological information. The expected result is Negative.  Fact Sheet for Patients:  EntrepreneurPulse.com.au  Fact Sheet for Healthcare Providers:  IncredibleEmployment.be  This test is no t yet approved or cleared by the [place] FDA and  has been authorized for detection and/or diagnosis of SARS-CoV-2 by FDA under an Emergency Use Authorization (EUA). This EUA will remain  in effect (meaning this test can be used) for the duration of the COVID-19 declaration under Section 564(b)(1) of  the Act, 21 U.S.C.section 360bbb-3(b)(1), unless the authorization is terminated  or revoked sooner.       Influenza A by PCR NEGATIVE NEGATIVE Final   Influenza B by PCR NEGATIVE NEGATIVE Final    Comment: (NOTE) The Xpert Xpress SARS-CoV-2/FLU/RSV plus assay is intended as an aid in the diagnosis of influenza from Nasopharyngeal swab specimens and should not be used as a sole basis for treatment. Nasal washings and aspirates are unacceptable for Xpert Xpress SARS-CoV-2/FLU/RSV testing.  Fact Sheet for Patients: EntrepreneurPulse.com.au  Fact Sheet for Healthcare Providers: IncredibleEmployment.be  This test is not yet approved or cleared by the [place] FDA and has been authorized for detection and/or diagnosis of SARS-CoV-2 by FDA under an Emergency Use Authorization (EUA). This EUA will remain in effect (meaning this test can be used) for the duration of the COVID-19 declaration under Section 564(b)(1) of the Act, 21 U.S.C. section 360bbb-3(b)(1), unless the authorization is terminated or revoked.  Performed at [facility], [place] [address], [place], [place] [address]   Respiratory (~20 pathogens) panel by PCR     Status: None   Collection Time: 10/09/21  9:40 AM   Specimen: Nasopharyngeal Swab; Respiratory  Result Value Ref Range Status   Adenovirus NOT DETECTED NOT DETECTED Final   Coronavirus 229E NOT DETECTED NOT DETECTED Final    Comment: (NOTE) The Coronavirus on the Respiratory Panel, DOES NOT test for the novel  Coronavirus (2019 nCoV)    Coronavirus HKU1 NOT DETECTED NOT DETECTED Final   Coronavirus NL63 NOT DETECTED NOT DETECTED Final   Coronavirus OC43 NOT DETECTED NOT DETECTED Final   Metapneumovirus NOT DETECTED NOT DETECTED Final   Rhinovirus / Enterovirus NOT DETECTED NOT DETECTED Final   Influenza A NOT DETECTED NOT DETECTED Final   Influenza B NOT DETECTED NOT DETECTED Final   Parainfluenza  Virus 1 NOT DETECTED NOT DETECTED Final  Parainfluenza Virus 2 NOT DETECTED NOT DETECTED Final   Parainfluenza Virus 3 NOT DETECTED NOT DETECTED Final   Parainfluenza Virus 4 NOT DETECTED NOT DETECTED Final   Respiratory Syncytial Virus NOT DETECTED NOT DETECTED Final   Bordetella pertussis NOT DETECTED NOT DETECTED Final   Bordetella Parapertussis NOT DETECTED NOT DETECTED Final   Chlamydophila pneumoniae NOT DETECTED NOT DETECTED Final   Mycoplasma pneumoniae NOT DETECTED NOT DETECTED Final    Comment: Performed at [facility], [place] [address] [place], [place] [address]     Time coordinating discharge: 35 minutes  SIGNED:   Aline August, MD  [facility] 10/13/2021, 12:49 PM

## 2021-10-13 NOTE — Anesthesia Postprocedure Evaluation (Signed)
Anesthesia Post Note  Patient: Tara Jackson  Procedure(s) Performed: ESOPHAGOGASTRODUODENOSCOPY (EGD) WITH PROPOFOL BIOPSY BOTOX INJECTION     Patient location during evaluation: PACU Anesthesia Type: MAC Level of consciousness: awake and alert Pain management: pain level controlled Vital Signs Assessment: post-procedure vital signs reviewed and stable Respiratory status: spontaneous breathing, nonlabored ventilation, respiratory function stable and patient connected to nasal cannula oxygen Cardiovascular status: stable and blood pressure returned to baseline Postop Assessment: no apparent nausea or vomiting Anesthetic complications: no   No notable events documented.  Last Vitals:  Vitals:   10/12/21 2149 10/13/21 0632  BP: (!) 143/67 (!) 144/53  Pulse: 67 64  Resp: 16 18  Temp: 36.7 C 37.2 C  SpO2: 98% 96%    Last Pain:  Vitals:   10/13/21 0632  TempSrc: Oral  PainSc:                  [name] S

## 2021-10-13 NOTE — Progress Notes (Signed)
Remote pacemaker transmission.   

## 2021-10-13 NOTE — Anesthesia Postprocedure Evaluation (Signed)
Anesthesia Post Note  Patient: Lyvonne Cassell  Procedure(s) Performed: ESOPHAGOGASTRODUODENOSCOPY (EGD) WITH PROPOFOL BIOPSY BOTOX INJECTION     Patient location during evaluation: PACU Anesthesia Type: MAC Level of consciousness: awake and alert Pain management: pain level controlled Vital Signs Assessment: post-procedure vital signs reviewed and stable Respiratory status: spontaneous breathing, nonlabored ventilation, respiratory function stable and patient connected to nasal cannula oxygen Cardiovascular status: stable and blood pressure returned to baseline Postop Assessment: no apparent nausea or vomiting Anesthetic complications: no   No notable events documented.  Last Vitals:  Vitals:   10/12/21 2149 10/13/21 0632  BP: (!) 143/67 (!) 144/53  Pulse: 67 64  Resp: 16 18  Temp: 36.7 C 37.2 C  SpO2: 98% 96%    Last Pain:  Vitals:   10/13/21 0632  TempSrc: Oral  PainSc:                  Jadae Steinke S

## 2021-10-13 NOTE — Progress Notes (Signed)
[facility] Hospital Liaison note:  This patient is currently enrolled in [facility] outpatient-based Palliative Care. Will continue to follow for disposition.  Please call with any outpatient palliative questions or concerns.  Thank you, Dee Curry, LPN [facility] Hospital Liaison [phone] 

## 2021-10-13 NOTE — Plan of Care (Signed)
Problem: Education: Goal: Ability to describe self-care measures that may prevent or decrease complications (Diabetes Survival Skills Education) will improve Outcome: Adequate for Discharge Goal: Individualized Educational Video(s) Outcome: Adequate for Discharge   Problem: Coping: Goal: Ability to adjust to condition or change in health will improve Outcome: Adequate for Discharge   Problem: Fluid Volume: Goal: Ability to maintain a balanced intake and output will improve Outcome: Adequate for Discharge   Problem: Health Behavior/Discharge Planning: Goal: Ability to identify and utilize available resources and services will improve Outcome: Adequate for Discharge Goal: Ability to manage health-related needs will improve Outcome: Adequate for Discharge   Problem: Metabolic: Goal: Ability to maintain appropriate glucose levels will improve Outcome: Adequate for Discharge   Problem: Nutritional: Goal: Maintenance of adequate nutrition will improve Outcome: Adequate for Discharge Goal: Progress toward achieving an optimal weight will improve Outcome: Adequate for Discharge   Problem: Skin Integrity: Goal: Risk for impaired skin integrity will decrease Outcome: Adequate for Discharge   Problem: Tissue Perfusion: Goal: Adequacy of tissue perfusion will improve Outcome: Adequate for Discharge   Problem: Education: Goal: Knowledge of General Education information will improve Description: Including pain rating scale, medication(s)/side effects and non-pharmacologic comfort measures Outcome: Adequate for Discharge   Problem: Health Behavior/Discharge Planning: Goal: Ability to manage health-related needs will improve Outcome: Adequate for Discharge   Problem: Clinical Measurements: Goal: Ability to maintain clinical measurements within normal limits will improve Outcome: Adequate for Discharge Goal: Will remain free from infection Outcome: Adequate for Discharge Goal:  Diagnostic test results will improve Outcome: Adequate for Discharge Goal: Respiratory complications will improve Outcome: Adequate for Discharge Goal: Cardiovascular complication will be avoided Outcome: Adequate for Discharge   Problem: Activity: Goal: Risk for activity intolerance will decrease Outcome: Adequate for Discharge   Problem: Nutrition: Goal: Adequate nutrition will be maintained Outcome: Adequate for Discharge   Problem: Coping: Goal: Level of anxiety will decrease Outcome: Adequate for Discharge   Problem: Elimination: Goal: Will not experience complications related to bowel motility Outcome: Adequate for Discharge Goal: Will not experience complications related to urinary retention Outcome: Adequate for Discharge   Problem: Pain Managment: Goal: General experience of comfort will improve Outcome: Adequate for Discharge   Problem: Safety: Goal: Ability to remain free from injury will improve Outcome: Adequate for Discharge   Problem: Skin Integrity: Goal: Risk for impaired skin integrity will decrease Outcome: Adequate for Discharge   Problem: Education: Goal: Knowledge of General Education information will improve Description: Including pain rating scale, medication(s)/side effects and non-pharmacologic comfort measures Outcome: Adequate for Discharge   Problem: Health Behavior/Discharge Planning: Goal: Ability to manage health-related needs will improve Outcome: Adequate for Discharge   Problem: Clinical Measurements: Goal: Ability to maintain clinical measurements within normal limits will improve Outcome: Adequate for Discharge Goal: Will remain free from infection Outcome: Adequate for Discharge Goal: Diagnostic test results will improve Outcome: Adequate for Discharge Goal: Respiratory complications will improve Outcome: Adequate for Discharge Goal: Cardiovascular complication will be avoided Outcome: Adequate for Discharge   Problem:  Activity: Goal: Risk for activity intolerance will decrease Outcome: Adequate for Discharge   Problem: Nutrition: Goal: Adequate nutrition will be maintained Outcome: Adequate for Discharge   Problem: Coping: Goal: Level of anxiety will decrease Outcome: Adequate for Discharge   Problem: Elimination: Goal: Will not experience complications related to bowel motility Outcome: Adequate for Discharge Goal: Will not experience complications related to urinary retention Outcome: Adequate for Discharge   Problem: Pain Managment: Goal: General experience of comfort   will improve Outcome: Adequate for Discharge   Problem: Safety: Goal: Ability to remain free from injury will improve Outcome: Adequate for Discharge   Problem: Skin Integrity: Goal: Risk for impaired skin integrity will decrease Outcome: Adequate for Discharge   

## 2021-10-13 NOTE — Progress Notes (Signed)
Occupational Therapy Treatment Patient Details Name: Tara Jackson MRN: [id number] DOB: 15-Dec-1940 Today's Date: 10/13/2021   History of present illness 81 year old female with dementia, who is bedbound/wheelchair-bound for little over a month, essential hypertension, diabetes mellitus type 2, CKD stage IIIb, chronic diastolic heart failure, morbid obesity, sick sinus syndrome status post pacemaker and admitted to the hospital 10/05/21 for chest pain (appears to be musculoskeletal) and nausea/vomiting.   OT comments  Patient minimally progressing but showed improved willingness to work on functional mobility in preparation for bathroom mobility and assisting CG, compared to previous session. Pt came to EOB with Moderate assist, elevated HOB, use of bed rails, and cues.  Pt worked on sit to stand using Denna Haggard with EOB highly elevated and Mod-Max As with pt unable to clear buttocks from bed except during 2nd out of 3 standing attempts. Pt able to maintain hips cleared for ~20 seconds to allow OT to perform needed peri hygiene, then lowered back to EOB.  Patient remains limited by lack of insight into impairments (for example, pt told her spouse, "I'm going to take some steps today" where pt has been unable to stand yet), poor problem solving stating that she does not care if her husband is injuried caring for her as that is "his job", generalized weakness and decreased activity tolerance along with deficits noted below. Pt continues to demonstrate fair rehab potential and would benefit from continued skilled OT to increase safety and independence with ADLs and functional transfers to allow pt to return home safely and reduce caregiver burden and fall risk.    Recommendations for follow up therapy are one component of a multi-disciplinary discharge planning process, led by the attending physician.  Recommendations may be updated based on patient status, additional functional criteria and insurance  authorization.    Follow Up Recommendations  Skilled nursing-short term rehab (<3 hours/day)    Assistance Recommended at Discharge Frequent or constant Supervision/Assistance  Patient can return home with the following  A little help with bathing/dressing/bathroom;A lot of help with bathing/dressing/bathroom;Direct supervision/assist for financial management;Assistance with cooking/housework;Direct supervision/assist for medications management   Equipment Recommendations  Other (comment) (Will need Hoyer lift and training if insists on home.)  Hospital Bed  Recommendations for Other Services      Precautions / Restrictions Precautions Precautions: Fall Precaution Comments: non amb, uses a scooter, has a lift chair, but does stand to pivot to chair Restrictions Weight Bearing Restrictions: No       Mobility Bed Mobility Overal bed mobility: Needs Assistance Bed Mobility: Supine to Sit, Sit to Supine     Supine to sit: Mod assist, HOB elevated Sit to supine: Max assist, HOB elevated   General bed mobility comments: In supine, pt able to use LEs and UEs to scoot posteriorly to [facility] in trendelenbug bed position with ankles blocked to give pt leverage.    Transfers                         Balance Overall balance assessment: Needs assistance Sitting-balance support: No upper extremity supported, Feet supported Sitting balance-Leahy Scale: Fair     Standing balance support: Reliant on assistive device for balance, Bilateral upper extremity supported Standing balance-Leahy Scale: Zero                             ADL either performed or assessed with clinical judgement   ADL Overall ADL's :  Needs assistance/impaired Eating/Feeding: Independent   Grooming: Set up;Bed level   Upper Body Bathing: Min guard;Sitting   Lower Body Bathing: Maximal assistance;Bed level;+2 for physical assistance   Upper Body Dressing : Min guard;Sitting;Set up   Lower  Body Dressing: Total assistance;Sitting/lateral leans Lower Body Dressing Details (indicate cue type and reason): Total Assist to don socks.   Toilet Transfer Details (indicate cue type and reason): Pt performed 3 sit to stand attempted from very elevated EOB to [employer]. Pt unable to come fully upright with any attempt, however did clear hips completely on 2nd stand allowing OT to perform posterior peri hygiene. Toileting- Clothing Manipulation and Hygiene: Total assistance;Bed level;Sit to/from stand Toileting - Clothing Manipulation Details (indicate cue type and reason): Pt found to be soiled once attempting stands. Pt pulled up on Denna Haggard to allow Total Assist peri care and bed pad change. Very poor tolerance to keeping hips cleared from EOB.     Functional mobility during ADLs: Maximal assistance Charlaine Dalton) General ADL Comments: Increased time spent educating pt and pt's husband re: need for post-acute inpatient rehab to avoid pt or husband injury. Pt responded, "I don't care if my husband gets injured. It's his job to help me." Husband also lacking insight and stating that he can take her home as she was standing before. Husband was reminded that pt was unable to come to a full stand even with [facility] and would likely need a mechanical lift to go home. Husband did not respond. Pt did agree to consider a SNF if it were not "[place]" but a few minutes later stated that she was "going home today."  Pt repeated that she has a PT/Nurse 1x/week and an Aide 3x/wk and that would be sufficient.    Extremity/Trunk Assessment Upper Extremity Assessment Upper Extremity Assessment: Generalized weakness   Lower Extremity Assessment Lower Extremity Assessment: Defer to PT evaluation        Vision Patient Visual Report: No change from baseline     Perception     Praxis      Cognition Arousal/Alertness: Awake/alert Behavior During Therapy: Flat affect, Impulsive Overall Cognitive Status:  History of cognitive impairments - at baseline                                 General Comments: vocal about fears of falling. Needs redirection. Pt stalls all mobility. Will stop mid-mobility and ask "May I read you something?" but is agreeable to redirection back to task. Very poor insight re: current impairments and current need for assistance.        Exercises      Shoulder Instructions       General Comments      Pertinent Vitals/ Pain       Pain Assessment Pain Assessment: Faces Faces Pain Scale: Hurts a little bit Pain Location: Pt initially reported back pain then reported stomach pain. Pain Intervention(s): Monitored during session, Repositioned, Limited activity within patient's tolerance  Home Living                                          Prior Functioning/Environment              Frequency  Min 2X/week        Progress Toward Goals  OT Goals(current goals can now  be found in the care plan section)  Progress towards OT goals: Progressing toward goals (Agreed to mobility today with encouragement and education)  Acute Rehab OT Goals Patient Stated Goal: to go home OT Goal Formulation: With patient/family Time For Goal Achievement: 10/21/21 Potential to Achieve Goals: [place] Discharge plan remains appropriate    Co-evaluation                 AM-PAC OT "6 Clicks" Daily Activity     Outcome Measure   Help from another person eating meals?: None Help from another person taking care of personal grooming?: A Little Help from another person toileting, which includes using toliet, bedpan, or urinal?: Total Help from another person bathing (including washing, rinsing, drying)?: A Lot Help from another person to put on and taking off regular upper body clothing?: A Little Help from another person to put on and taking off regular lower body clothing?: Total 6 Click Score: 14    End of Session Equipment  Utilized During Treatment: Gait belt  OT Visit Diagnosis: Muscle weakness (generalized) (M62.81);Unsteadiness on feet (R26.81)   Activity Tolerance Patient limited by fatigue;Patient limited by pain (Self-Limiting)   Patient Left in bed;with call bell/phone within reach;with bed alarm set;with family/visitor present;with nursing/sitter in room   Nurse Communication Other (comment) (Need of fresh pure wick)        Time: 7353-2992 OT Time Calculation (min): 38 min  Charges: OT General Charges $OT Visit: 1 Visit OT Treatments $Self Care/Home Management : 8-22 mins $Therapeutic Activity: 23-37 mins  Anderson Malta, [place] Office: [phone] 10/13/2021  Julien Girt 10/13/2021, 10:58 AM

## 2021-10-13 NOTE — TOC Transition Note (Addendum)
Transition of Care [facility]) - CM/SW Discharge Note   Patient Details  Name: Tara Jackson MRN: [id number] Date of Birth: Nov 21, 1940  [facility] CM/SW Contact:  Roseanne Kaufman, RN Phone Number: 10/13/2021, 1:03 PM   Clinical Narrative:  Damaris Schooner with Ang[facility]/ [facility],  advised of new orders: HHPT/OT, RN, HHA, services will resume. Patient's husband [name] will transport her at discharge.   No additional TOC needs at this time.  - 2:24p RN notified that family unable to transport patient home. This RNCM will coordinate EMS transport at discharge. TOC will continue to follow.  -3:18p PTAR has been called and PTAR package is at Biomedical engineer desk. This RNCM spoke with patient and husband to explain the PTAR process.  RN notified.  Final next level of care: Home w Home Health Services Barriers to Discharge: No Barriers Identified   Patient Goals and CMS Choice Patient states their goals for this hospitalization and ongoing recovery are:: wife states that the goal is to return home CMS Medicare.gov Compare Post Acute Care list provided to:: Patient Represenative (must comment) (wife) Choice offered to / list presented to : Spouse  Discharge Placement  Home with HHPT/OT, RN, HHA                     Discharge Plan and Services   Discharge Planning Services: CM Consult            DME Arranged: N/A         HH Arranged: PT, OT, RN, Nurse's Aide [place] Agency: Other - See comment ([place]) Date [place]: 10/13/21 Time [place]: 1215 Representative spoke with at [place]: [place] ([place]) Interventions     Readmission Risk Interventions    08/16/2021   12:41 PM  Readmission Risk Prevention Plan  Transportation Screening Complete  PCP or Specialist Appt within 5-7 Days Complete  Home Care Screening Complete  Medication Review (RN CM) Complete

## 2021-10-14 ENCOUNTER — Encounter (HOSPITAL_COMMUNITY): Payer: Self-pay | Admitting: Gastroenterology

## 2021-10-15 LAB — SURGICAL PATHOLOGY

## 2021-11-16 ENCOUNTER — Emergency Department (HOSPITAL_COMMUNITY): Payer: Medicare HMO

## 2021-11-16 ENCOUNTER — Emergency Department (HOSPITAL_COMMUNITY)
Admission: EM | Admit: 2021-11-16 | Discharge: 2021-11-16 | Disposition: A | Discharge status: Home / Self Care | Payer: Medicare HMO | Attending: Emergency Medicine | Admitting: Emergency Medicine

## 2021-11-16 ENCOUNTER — Encounter (HOSPITAL_COMMUNITY): Payer: Self-pay

## 2021-11-16 ENCOUNTER — Other Ambulatory Visit: Payer: Self-pay

## 2021-11-16 DIAGNOSIS — R1084 Generalized abdominal pain: Secondary | ICD-10-CM | POA: Diagnosis present

## 2021-11-16 DIAGNOSIS — E876 Hypokalemia: Secondary | ICD-10-CM | POA: Diagnosis not present

## 2021-11-16 DIAGNOSIS — Z7901 Long term (current) use of anticoagulants: Secondary | ICD-10-CM | POA: Insufficient documentation

## 2021-11-16 DIAGNOSIS — R627 Adult failure to thrive: Secondary | ICD-10-CM

## 2021-11-16 DIAGNOSIS — E119 Type 2 diabetes mellitus without complications: Secondary | ICD-10-CM | POA: Diagnosis not present

## 2021-11-16 DIAGNOSIS — Z794 Long term (current) use of insulin: Secondary | ICD-10-CM | POA: Insufficient documentation

## 2021-11-16 DIAGNOSIS — I10 Essential (primary) hypertension: Secondary | ICD-10-CM | POA: Insufficient documentation

## 2021-11-16 LAB — CBC WITH DIFFERENTIAL/PLATELET
Abs Immature Granulocytes: 0.05 10*3/uL (ref 0.00–0.07)
Basophils Absolute: 0 10*3/uL (ref 0.0–0.1)
Basophils Relative: 1 %
Eosinophils Absolute: 0 10*3/uL (ref 0.0–0.5)
Eosinophils Relative: 0 %
HCT: 34.4 % — ABNORMAL LOW (ref 36.0–46.0)
Hemoglobin: 10.9 g/dL — ABNORMAL LOW (ref 12.0–15.0)
Immature Granulocytes: 1 %
Lymphocytes Relative: 25 %
Lymphs Abs: 2 10*3/uL (ref 0.7–4.0)
MCH: 31 pg (ref 26.0–34.0)
MCHC: 31.7 g/dL (ref 30.0–36.0)
MCV: 97.7 fL (ref 80.0–100.0)
Monocytes Absolute: 0.5 10*3/uL (ref 0.1–1.0)
Monocytes Relative: 6 %
Neutro Abs: 5.3 10*3/uL (ref 1.7–7.7)
Neutrophils Relative %: 67 %
Platelets: 184 10*3/uL (ref 150–400)
RBC: 3.52 MIL/uL — ABNORMAL LOW (ref 3.87–5.11)
RDW: 13.2 % (ref 11.5–15.5)
WBC: 7.9 10*3/uL (ref 4.0–10.5)
nRBC: 0 % (ref 0.0–0.2)

## 2021-11-16 LAB — COMPREHENSIVE METABOLIC PANEL
ALT: 21 U/L (ref 0–44)
AST: 27 U/L (ref 15–41)
Albumin: 3 g/dL — ABNORMAL LOW (ref 3.5–5.0)
Alkaline Phosphatase: 55 U/L (ref 38–126)
Anion gap: 16 — ABNORMAL HIGH (ref 5–15)
BUN: 19 mg/dL (ref 8–23)
CO2: 24 mmol/L (ref 22–32)
Calcium: 8.9 mg/dL (ref 8.9–10.3)
Chloride: 97 mmol/L — ABNORMAL LOW (ref 98–111)
Creatinine, Ser: 1.29 mg/dL — ABNORMAL HIGH (ref 0.44–1.00)
GFR, Estimated: 41 mL/min — ABNORMAL LOW (ref >60)
Glucose, Bld: 264 mg/dL — ABNORMAL HIGH (ref 70–99)
Potassium: 2.9 mmol/L — ABNORMAL LOW (ref 3.5–5.1)
Sodium: 137 mmol/L (ref 135–145)
Total Bilirubin: 1.6 mg/dL — ABNORMAL HIGH (ref 0.3–1.2)
Total Protein: 6.4 g/dL — ABNORMAL LOW (ref 6.5–8.1)

## 2021-11-16 LAB — LIPASE, BLOOD: Lipase: 22 U/L (ref 11–51)

## 2021-11-16 MED ORDER — SODIUM CHLORIDE 0.9 % IV BOLUS
1000.0000 mL | Freq: Once | INTRAVENOUS | Status: AC
  Administered 2021-11-16: 1000 mL via INTRAVENOUS

## 2021-11-16 MED ORDER — POTASSIUM CHLORIDE CRYS ER 20 MEQ PO TBCR
40.0000 meq | EXTENDED_RELEASE_TABLET | Freq: Once | ORAL | Status: AC
  Administered 2021-11-16: 40 meq via ORAL
  Filled 2021-11-16: qty 2

## 2021-11-16 MED ORDER — ONDANSETRON HCL 4 MG/2ML IJ SOLN
4.0000 mg | Freq: Once | INTRAMUSCULAR | Status: AC
  Administered 2021-11-16: 4 mg via INTRAVENOUS
  Filled 2021-11-16: qty 2

## 2021-11-16 MED ORDER — POTASSIUM CHLORIDE 10 MEQ/100ML IV SOLN
10.0000 meq | INTRAVENOUS | Status: AC
  Administered 2021-11-16 (×2): 10 meq via INTRAVENOUS
  Filled 2021-11-16 (×2): qty 100

## 2021-11-16 MED ORDER — POTASSIUM CHLORIDE 20 MEQ PO PACK: 20.0000 meq | PACK | Freq: Every day | ORAL | 0 refills | Status: AC

## 2021-11-16 NOTE — ED Notes (Signed)
PTAR called patient is 5th on the list .

## 2021-11-16 NOTE — ED Triage Notes (Signed)
Pt to er room number 17 via ems, per ems pt is getting more weak and is a failure to thrive, states that she is home on hospice, but the family hasn't been able to get in touch with hospice.  Pt states that she has some abd pain.

## 2021-11-16 NOTE — ED Provider Notes (Signed)
[facility] DEPT Provider Note   CSN: [id number] Arrival date & time: 11/16/21  1445     History  Chief Complaint  Patient presents with   Weakness    Tara Jackson is a 81 y.o. female history diabetes, hypertension, esophageal dysmotility, here presenting with poor appetite and abdominal pain.  Patient lives at home and is wheelchair-bound at baseline.  Patient has poor appetite and has been having diffuse abdominal pain for the last month or so.  Per the family, they have been trying to get her on hospice so if they can get some more care at home but they were unable to reach hospice.  Apparently hospice was trying to reach them and unable to get touch with family.  Patient came here because she has not been eating and they were concerned that patient is dehydrated.  The history is provided by the patient and a caregiver.       Home Medications Prior to Admission medications   Medication Sig Start Date End Date Taking? Authorizing Provider  acetaminophen (TYLENOL) 325 MG tablet Take 2 tablets (650 mg total) by mouth every 6 (six) hours as needed for up to 30 doses for mild pain or moderate pain. 07/21/20   Wyvonnia Dusky, MD  albuterol (VENTOLIN HFA) 108 (90 Base) MCG/ACT inhaler INHALE 2 INHALATIONS 3 TIMES A DAY AS NEEDED FOR COUGH/WHEEZE Patient taking differently: Inhale 2 puffs into the lungs 3 (three) times daily as needed for wheezing (cough). 12/23/20   Tanda Rockers, MD  busPIRone (BUSPAR) 10 MG tablet Take 1 tablet (10 mg total) by mouth every morning. 06/03/14   Garvin Fila, MD  Cholecalciferol (VITAMIN D-3) 25 MCG (1000 UT) CAPS Take 3,000 Units by mouth every morning.    [provider]  clonazePAM (KLONOPIN) 0.5 MG tablet Take 1 tablet (0.5 mg total) by mouth 2 (two) times daily. 10/13/21   Aline August, MD  clopidogrel (PLAVIX) 75 MG tablet Take 75 mg by mouth every morning.    [provider]  dicyclomine  (BENTYL) 20 MG tablet Take 40 mg by mouth in the morning and at bedtime. for cramping    [provider]  donepezil (ARICEPT) 10 MG tablet TAKE 1 TABLET BY MOUTH EVERYDAY AT BEDTIME Patient taking differently: Take 10 mg by mouth at bedtime. 10/04/21   Frann Rider, NP  guaiFENesin (MUCINEX) 600 MG 12 hr tablet Take 600 mg by mouth 2 (two) times daily as needed for cough.    [provider]  insulin NPH (HUMULIN N,NOVOLIN N) 100 UNIT/ML injection Inject 5-20 Units into the skin See admin instructions. In the morning:  If cbg >300, then give 20 units. If cbg < or = 300, then give 15 unit At night: If cbg >250, then give 5 units. If cbg < or = 250, then give no insulin    [provider]  insulin regular (NOVOLIN R,HUMULIN R) 100 units/mL injection Inject 0-30 Units into the skin See admin instructions. At breakfast: if cbg < 300, then give 25 units. If cbg > or = 300, then give 30 units. After dinner: goal cbgs is 150 and give insulin dose to reach this. For every 100 cbg over 150, then give 10 units of insulin.    [provider]  levothyroxine (SYNTHROID) 100 MCG tablet Take 100 mcg by mouth daily before breakfast.    [provider]  lidocaine (LIDODERM) 5 % Place 2 patches onto the skin  daily as needed (pain). Remove & Discard patch within 12 hours or as directed by MD    [provider]  linaclotide (LINZESS) 290 MCG CAPS capsule Take 290 mcg by mouth daily as needed (for constipation).    [provider]  Menthol-Methyl Salicylate (MUSCLE RUB) 10-15 % CREA Apply 1 application topically as needed for muscle pain.    [provider]  nortriptyline (PAMELOR) 50 MG capsule Take 50 mg by mouth at bedtime. 09/02/21   [provider]  OXYGEN Inhale 2 L into the lungs as needed. Used at overnight with a Cytogeneticist, Historical, MD  pantoprazole (PROTONIX) 40 MG tablet Take 1 tablet (40 mg total) by mouth 2 (two)  times daily. 10/13/21   Aline August, MD  rosuvastatin (CRESTOR) 20 MG tablet Take 20 mg by mouth daily.  03/13/17   [provider]  solifenacin (VESICARE) 5 MG tablet Take 5 mg by mouth daily as needed (to prevent UTI). 02/24/19   [provider]  SYMBICORT 80-4.5 MCG/ACT inhaler INHALE 2 PUFFS FIRST THING IN THE MORNING THEN ANOTHER 2 PUFFS ABOUT 12 HOURS LATER Patient not taking: Reported on 08/14/2021 10/27/20   Tanda Rockers, MD  terconazole (TERAZOL 7) 0.4 % vaginal cream Place 1 applicator vaginally daily as needed for itching. 02/12/19   [provider]      Allergies    Iodine, Iohexol, Metoclopramide hcl, Sulfa antibiotics, Lactose intolerance (gi), and Lansoprazole    Review of Systems   Review of Systems  Gastrointestinal:  Positive for nausea.  Neurological:  Positive for weakness.  All other systems reviewed and are negative.   Physical Exam Updated Vital Signs BP (!) 195/71   Pulse 70   Temp 97.8 F (36.6 C) (Oral)   Resp 19   Ht '5\' 6"'$  (1.676 m)   Wt 99.8 kg   SpO2 100%   BMI 35.51 kg/m  Physical Exam Vitals and nursing note reviewed.  Constitutional:      Comments: Chronically ill  HENT:     Head: Normocephalic.     Nose: Nose normal.     Mouth/Throat:     Mouth: Mucous membranes are dry.  Eyes:     Extraocular Movements: Extraocular movements intact.     Pupils: Pupils are equal, round, and reactive to light.  Cardiovascular:     Rate and Rhythm: Normal rate and regular rhythm.     Pulses: Normal pulses.     Heart sounds: Normal heart sounds.  Pulmonary:     Effort: Pulmonary effort is normal.     Breath sounds: Normal breath sounds.  Abdominal:     General: Abdomen is flat.     Palpations: Abdomen is soft.     Comments: Mild diffuse lower abdominal tenderness  Musculoskeletal:        General: Normal range of motion.     Cervical back: Normal range of motion and neck supple.  Skin:    General: Skin is warm.     Capillary  Refill: Capillary refill takes less than 2 seconds.  Neurological:     General: No focal deficit present.     Mental Status: She is oriented to person, place, and time.  Psychiatric:        Mood and Affect: Mood normal.        Behavior: Behavior normal.     ED Results / Procedures / Treatments   Labs (all labs ordered are listed, but only abnormal  results are displayed) Labs Reviewed  CBC WITH DIFFERENTIAL/PLATELET - Abnormal; Notable for the following components:      Result Value   RBC 3.52 (*)    Hemoglobin 10.9 (*)    HCT 34.4 (*)    All other components within normal limits  COMPREHENSIVE METABOLIC PANEL - Abnormal; Notable for the following components:   Potassium 2.9 (*)    Chloride 97 (*)    Glucose, Bld 264 (*)    Creatinine, Ser 1.29 (*)    Total Protein 6.4 (*)    Albumin 3.0 (*)    Total Bilirubin 1.6 (*)    GFR, Estimated 41 (*)    Anion gap 16 (*)    All other components within normal limits  LIPASE, BLOOD  URINALYSIS, ROUTINE W REFLEX MICROSCOPIC    EKG None  Radiology CT ABDOMEN PELVIS WO CONTRAST  Result Date: 11/16/2021 CLINICAL DATA:  Nonlocalized abdominal pain EXAM: CT ABDOMEN AND PELVIS WITHOUT CONTRAST TECHNIQUE: Multidetector CT imaging of the abdomen and pelvis was performed following the standard protocol without IV contrast. RADIATION DOSE REDUCTION: This exam was performed according to the departmental dose-optimization program which includes automated exposure control, adjustment of the mA and/or kV according to patient size and/or use of iterative reconstruction technique. COMPARISON:  10/05/2021 FINDINGS: Lower chest: Minimal bibasilar atelectasis. Pacemaker lead RIGHT ventricle. Small hiatal hernia. Hepatobiliary: Gallbladder surgically absent.  Liver unremarkable. Pancreas: Atrophic pancreas without mass Spleen: Normal appearance Adrenals/Urinary Tract: Adrenal glands, kidneys, ureters, and bladder normal appearance Stomach/Bowel: Appendix  surgically absent. Stomach and bowel loops otherwise unremarkable. Vascular/Lymphatic: Scattered atherosclerotic calcifications. Aorta normal caliber. No adenopathy. Reproductive: Uterus surgically absent. Nonvisualization of ovaries. Other: No free air or free fluid. No hernia or inflammatory process. Musculoskeletal: Osseous demineralization. IMPRESSION: Small hiatal hernia. No acute intra-abdominal or intrapelvic abnormalities. Electronically Signed   By: Lavonia Dana M.D.   On: 11/16/2021 16:12    Procedures Procedures    Medications Ordered in ED Medications  potassium chloride 10 mEq in 100 mL IVPB (10 mEq Intravenous New Bag/Given 11/16/21 1634)  sodium chloride 0.9 % bolus 1,000 mL (1,000 mLs Intravenous New Bag/Given 11/16/21 1630)  sodium chloride 0.9 % bolus 1,000 mL (0 mLs Intravenous Stopped 11/16/21 1630)  ondansetron (ZOFRAN) injection 4 mg (4 mg Intravenous Given 11/16/21 1522)  potassium chloride SA (KLOR-CON M) CR tablet 40 mEq (40 mEq Oral Given 11/16/21 1634)    ED Course/ Medical Decision Making/ A&P                           Medical Decision Making Temari Schooler is a 81 y.o. female here presenting with abdominal pain and failure to thrive.  Patient has poor appetite and they think this is a chronic issue.  Patient's main issue is getting hospice care.  I do not know if patient would qualify for hospice or just palliative care or just home health.  We will have social work contact hospice for evaluation.  In the meantime, we will get CBC and CMP and CT abdomen pelvis and urinalysis and will hydrate patient.  4:40 PM Charge nurse was able to reach palliative care service.  They state that they are trying to contact family but unable to reach family.  Husband is at bedside and his contact info is updated in our system.  They request that we do a referral to hospice.  Case management and social work also aware of the situation.  6:52  PM Patient's potassium is 2.9.   Patient's anion gap is 16.  Patient received 1 L bolus and potassium supplementation.  I reviewed her CT scan which were unremarkable.  Hospice will come out to the house on Thursday.  Patient will be discharged home with potassium supplementation.  Problems Addressed: Failure to thrive in adult: acute illness or injury Hypokalemia: acute illness or injury  Amount and/or Complexity of Data Reviewed Labs: ordered. Decision-making details documented in ED Course. Radiology: ordered and independent interpretation performed. Decision-making details documented in ED Course.  Risk Prescription drug management.   Final Clinical Impression(s) / ED Diagnoses Final diagnoses:  None    Rx / DC Orders ED Discharge Orders          Ordered    Ambulatory referral to Hospice        11/16/21 1553              Drenda Freeze, MD 11/16/21 [phone]

## 2021-11-16 NOTE — ED Notes (Addendum)
Writer spoke with Hospice- they have tried numerous times to contact the pt since her discharge on Sept 9th. When husband comes assistance will be offered to help him contact them and set up visit appointment upon pts discharge.

## 2021-11-16 NOTE — ED Notes (Signed)
After oral potassium pt vomited/spit up some water and white liquid, md notified.

## 2021-11-16 NOTE — ED Notes (Signed)
Pt in bed, pt denies nausea, pt reports a slight decrease in pain, pt has not voided, encouraged pt to void.

## 2021-11-16 NOTE — Care Management (Signed)
As per ED Nurse Chong Sicilian, Tara Jackson has been made aware of patient and needs and will follow up with patient tomorrow 10/11. No further TOC needs identified at this time.

## 2021-11-16 NOTE — ED Notes (Signed)
Spoke with Tara Jackson at [facility], after discussion unsure if pt needs Hospice or Pallative Care. Hospice referral/evaluation has been placed by Dr Darl Householder. The husband will call Hospice when pt arrives home in order for Hospice to come evaluate for appropriate services. He knows to leave a message if no one answers.

## 2021-11-16 NOTE — ED Notes (Signed)
Spoke with Hospice regarding the referral. They will follow up with pt tomorrow and plan visit on Thursday. Husband and pt have been updated.

## 2021-11-16 NOTE — Discharge Instructions (Signed)
Your potassium is low  I have prescribed potassium  Please stay as hydrated

## 2021-12-08 DEATH — deceased

## 2022-02-02 ENCOUNTER — Ambulatory Visit: Payer: Medicare HMO | Admitting: Neurology

## 2022-02-02 ENCOUNTER — Ambulatory Visit: Payer: Self-pay | Admitting: Neurology

## 2022-05-06 ENCOUNTER — Encounter (INDEPENDENT_AMBULATORY_CARE_PROVIDER_SITE_OTHER): Payer: Medicare HMO | Admitting: Ophthalmology
